# Patient Record
Sex: Male | Born: 1955
Health system: Southern US, Community
[De-identification: ages and names within clinical notes are randomized; demographics above are authoritative.]

## PROBLEM LIST (undated history)

## (undated) ENCOUNTER — Encounter: Attending: Internal Medicine | Primary: Internal Medicine

## (undated) ENCOUNTER — Ambulatory Visit

## (undated) ENCOUNTER — Encounter

## (undated) ENCOUNTER — Ambulatory Visit: Attending: Pharmacist | Primary: Pharmacist

## (undated) ENCOUNTER — Encounter: Attending: Pharmacist | Primary: Pharmacist

## (undated) ENCOUNTER — Telehealth

## (undated) ENCOUNTER — Encounter: Attending: Pulmonary Disease | Primary: Pulmonary Disease

## (undated) ENCOUNTER — Encounter: Attending: "Endocrinology | Primary: "Endocrinology

## (undated) ENCOUNTER — Non-Acute Institutional Stay: Payer: MEDICARE

## (undated) ENCOUNTER — Ambulatory Visit: Payer: PRIVATE HEALTH INSURANCE

## (undated) ENCOUNTER — Other Ambulatory Visit

## (undated) ENCOUNTER — Encounter: Payer: MEDICARE | Attending: Speech-Language Pathologist | Primary: Speech-Language Pathologist

## (undated) ENCOUNTER — Encounter: Payer: MEDICARE | Attending: Internal Medicine | Primary: Internal Medicine

## (undated) ENCOUNTER — Ambulatory Visit: Payer: Medicare (Managed Care)

## (undated) ENCOUNTER — Encounter: Attending: Registered" | Primary: Registered"

## (undated) ENCOUNTER — Telehealth: Attending: Internal Medicine | Primary: Internal Medicine

## (undated) ENCOUNTER — Telehealth: Attending: Family Medicine | Primary: Family Medicine

## (undated) ENCOUNTER — Encounter: Attending: Speech-Language Pathologist | Primary: Speech-Language Pathologist

## (undated) ENCOUNTER — Encounter: Attending: Adult Health | Primary: Adult Health

## (undated) ENCOUNTER — Ambulatory Visit: Attending: Family Medicine | Primary: Family Medicine

## (undated) ENCOUNTER — Encounter: Attending: Diagnostic Radiology | Primary: Diagnostic Radiology

## (undated) ENCOUNTER — Telehealth: Attending: Pharmacist | Primary: Pharmacist

## (undated) ENCOUNTER — Ambulatory Visit: Payer: MEDICARE

## (undated) ENCOUNTER — Non-Acute Institutional Stay: Payer: MEDICARE | Attending: Speech-Language Pathologist | Primary: Speech-Language Pathologist

## (undated) ENCOUNTER — Encounter
Attending: Student in an Organized Health Care Education/Training Program | Primary: Student in an Organized Health Care Education/Training Program

## (undated) ENCOUNTER — Ambulatory Visit: Payer: PRIVATE HEALTH INSURANCE | Attending: Diagnostic Radiology | Primary: Diagnostic Radiology

## (undated) ENCOUNTER — Encounter: Attending: Hospice and Palliative Medicine | Primary: Hospice and Palliative Medicine

## (undated) ENCOUNTER — Telehealth
Attending: Student in an Organized Health Care Education/Training Program | Primary: Student in an Organized Health Care Education/Training Program

## (undated) ENCOUNTER — Ambulatory Visit: Payer: PRIVATE HEALTH INSURANCE | Attending: "Endocrinology | Primary: "Endocrinology

## (undated) ENCOUNTER — Inpatient Hospital Stay

## (undated) ENCOUNTER — Encounter: Payer: MEDICARE | Attending: Hospice and Palliative Medicine | Primary: Hospice and Palliative Medicine

## (undated) ENCOUNTER — Ambulatory Visit: Payer: PRIVATE HEALTH INSURANCE | Attending: Speech-Language Pathologist | Primary: Speech-Language Pathologist

## (undated) DIAGNOSIS — K219 Gastro-esophageal reflux disease without esophagitis: Secondary | ICD-10-CM

## (undated) DIAGNOSIS — J479 Bronchiectasis, uncomplicated: Secondary | ICD-10-CM

## (undated) DIAGNOSIS — M75102 Unspecified rotator cuff tear or rupture of left shoulder, not specified as traumatic: Secondary | ICD-10-CM

## (undated) DIAGNOSIS — Z87442 Personal history of urinary calculi: Secondary | ICD-10-CM

## (undated) DIAGNOSIS — J219 Acute bronchiolitis, unspecified: Secondary | ICD-10-CM

## (undated) DIAGNOSIS — I10 Essential (primary) hypertension: Secondary | ICD-10-CM

## (undated) DIAGNOSIS — R0600 Dyspnea, unspecified: Secondary | ICD-10-CM

## (undated) DIAGNOSIS — J851 Abscess of lung with pneumonia: Secondary | ICD-10-CM

## (undated) DIAGNOSIS — J189 Pneumonia, unspecified organism: Secondary | ICD-10-CM

## (undated) DIAGNOSIS — Z9889 Other specified postprocedural states: Secondary | ICD-10-CM

## (undated) DIAGNOSIS — M1990 Unspecified osteoarthritis, unspecified site: Secondary | ICD-10-CM

## (undated) DIAGNOSIS — R42 Dizziness and giddiness: Secondary | ICD-10-CM

## (undated) DIAGNOSIS — C61 Malignant neoplasm of prostate: Secondary | ICD-10-CM

## (undated) DIAGNOSIS — R112 Nausea with vomiting, unspecified: Secondary | ICD-10-CM

## (undated) DIAGNOSIS — G4733 Obstructive sleep apnea (adult) (pediatric): Secondary | ICD-10-CM

## (undated) HISTORY — DX: Unspecified rotator cuff tear or rupture of left shoulder, not specified as traumatic: M75.102

## (undated) HISTORY — DX: Obstructive sleep apnea (adult) (pediatric): G47.33

## (undated) HISTORY — DX: Bronchiectasis, uncomplicated: J47.9

## (undated) HISTORY — PX: OTHER SURGICAL HISTORY: SHX169

## (undated) HISTORY — DX: Acute bronchiolitis, unspecified: J21.9

## (undated) HISTORY — PX: BACK SURGERY: SHX140

## (undated) HISTORY — DX: Essential (primary) hypertension: I10

## (undated) HISTORY — DX: Dizziness and giddiness: R42

## (undated) HISTORY — DX: Unspecified osteoarthritis, unspecified site: M19.90

## (undated) HISTORY — PX: NECK SURGERY: SHX720

## (undated) HISTORY — DX: Gastro-esophageal reflux disease without esophagitis: K21.9

## (undated) HISTORY — DX: Pneumonia, unspecified organism: J18.9

## (undated) HISTORY — PX: KNEE ARTHROSCOPY: SHX127

## (undated) HISTORY — PX: VIDEO BRONCHOSCOPY: SHX5072

## (undated) HISTORY — DX: Abscess of lung with pneumonia: J85.1

## (undated) MED ORDER — CHOLECALCIFEROL (VITAMIN D3) 50 MCG (2,000 UNIT) CAPSULE: ORAL | 0.00000 days

## (undated) MED ORDER — ZINC GLUCONATE 50 MG TABLET: ORAL | 0 days

---
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
## 1980-12-21 HISTORY — PX: COLONOSCOPY: SHX174

## 1997-03-23 HISTORY — PX: CARPAL TUNNEL RELEASE: SHX101

## 1997-07-03 ENCOUNTER — Inpatient Hospital Stay (HOSPITAL_COMMUNITY): Admission: RE | Admit: 1997-07-03 | Discharge: 1997-07-04 | Payer: Self-pay | Admitting: Neurosurgery

## 1998-01-07 ENCOUNTER — Ambulatory Visit (HOSPITAL_COMMUNITY): Admission: RE | Admit: 1998-01-07 | Discharge: 1998-01-07 | Payer: Self-pay | Admitting: Neurosurgery

## 1998-01-21 ENCOUNTER — Encounter: Payer: Self-pay | Admitting: Neurosurgery

## 1998-01-21 ENCOUNTER — Ambulatory Visit (HOSPITAL_COMMUNITY): Admission: RE | Admit: 1998-01-21 | Discharge: 1998-01-21 | Payer: Self-pay | Admitting: Neurosurgery

## 1998-02-04 ENCOUNTER — Encounter: Payer: Self-pay | Admitting: Neurosurgery

## 1998-02-04 ENCOUNTER — Ambulatory Visit (HOSPITAL_COMMUNITY): Admission: RE | Admit: 1998-02-04 | Discharge: 1998-02-04 | Payer: Self-pay | Admitting: Neurosurgery

## 1998-04-03 ENCOUNTER — Encounter: Admission: RE | Admit: 1998-04-03 | Discharge: 1998-06-25 | Payer: Self-pay | Admitting: Anesthesiology

## 1998-05-30 ENCOUNTER — Ambulatory Visit (HOSPITAL_COMMUNITY): Admission: RE | Admit: 1998-05-30 | Discharge: 1998-05-30 | Payer: Self-pay

## 1998-07-09 ENCOUNTER — Ambulatory Visit: Admission: RE | Admit: 1998-07-09 | Discharge: 1998-07-09 | Payer: Self-pay | Admitting: Family Medicine

## 2001-11-11 ENCOUNTER — Emergency Department (HOSPITAL_COMMUNITY): Admission: EM | Admit: 2001-11-11 | Discharge: 2001-11-11 | Payer: Self-pay | Admitting: Emergency Medicine

## 2001-11-11 ENCOUNTER — Encounter: Payer: Self-pay | Admitting: Emergency Medicine

## 2002-06-16 ENCOUNTER — Encounter: Admission: RE | Admit: 2002-06-16 | Discharge: 2002-06-16 | Payer: Self-pay | Admitting: Neurology

## 2002-06-16 ENCOUNTER — Encounter: Payer: Self-pay | Admitting: Neurology

## 2002-08-16 ENCOUNTER — Encounter: Admission: RE | Admit: 2002-08-16 | Discharge: 2002-08-16 | Payer: Self-pay | Admitting: Orthopedic Surgery

## 2002-08-16 ENCOUNTER — Encounter: Payer: Self-pay | Admitting: Orthopedic Surgery

## 2003-02-27 ENCOUNTER — Emergency Department (HOSPITAL_COMMUNITY): Admission: EM | Admit: 2003-02-27 | Discharge: 2003-02-27 | Payer: Self-pay | Admitting: Emergency Medicine

## 2003-08-31 ENCOUNTER — Ambulatory Visit (HOSPITAL_COMMUNITY): Admission: RE | Admit: 2003-08-31 | Discharge: 2003-09-01 | Payer: Self-pay | Admitting: Otolaryngology

## 2003-09-01 ENCOUNTER — Emergency Department (HOSPITAL_COMMUNITY): Admission: EM | Admit: 2003-09-01 | Discharge: 2003-09-02 | Payer: Self-pay | Admitting: Emergency Medicine

## 2004-01-16 ENCOUNTER — Emergency Department (HOSPITAL_COMMUNITY): Admission: EM | Admit: 2004-01-16 | Discharge: 2004-01-16 | Payer: Self-pay | Admitting: Emergency Medicine

## 2004-04-25 ENCOUNTER — Ambulatory Visit: Payer: Self-pay | Admitting: Internal Medicine

## 2004-05-26 ENCOUNTER — Ambulatory Visit: Payer: Self-pay | Admitting: Internal Medicine

## 2004-05-27 ENCOUNTER — Ambulatory Visit: Payer: Self-pay | Admitting: Internal Medicine

## 2004-05-28 ENCOUNTER — Ambulatory Visit: Payer: Self-pay | Admitting: Gastroenterology

## 2004-06-03 ENCOUNTER — Ambulatory Visit: Payer: Self-pay | Admitting: Gastroenterology

## 2004-06-06 ENCOUNTER — Ambulatory Visit: Payer: Self-pay | Admitting: Gastroenterology

## 2004-06-12 ENCOUNTER — Ambulatory Visit: Payer: Self-pay | Admitting: Internal Medicine

## 2004-06-26 ENCOUNTER — Ambulatory Visit: Payer: Self-pay | Admitting: Gastroenterology

## 2004-07-10 ENCOUNTER — Observation Stay (HOSPITAL_COMMUNITY): Admission: EM | Admit: 2004-07-10 | Discharge: 2004-07-11 | Payer: Self-pay | Admitting: Emergency Medicine

## 2004-09-25 ENCOUNTER — Ambulatory Visit: Payer: Self-pay | Admitting: Internal Medicine

## 2004-12-19 ENCOUNTER — Ambulatory Visit: Payer: Self-pay | Admitting: Oncology

## 2005-01-08 ENCOUNTER — Ambulatory Visit (HOSPITAL_COMMUNITY): Admission: RE | Admit: 2005-01-08 | Discharge: 2005-01-08 | Payer: Self-pay | Admitting: Oncology

## 2005-02-03 ENCOUNTER — Ambulatory Visit: Payer: Self-pay | Admitting: Internal Medicine

## 2005-02-15 ENCOUNTER — Ambulatory Visit: Payer: Self-pay | Admitting: Oncology

## 2005-04-03 ENCOUNTER — Ambulatory Visit: Payer: Self-pay | Admitting: Oncology

## 2005-05-29 ENCOUNTER — Ambulatory Visit: Payer: Self-pay | Admitting: Oncology

## 2005-07-22 ENCOUNTER — Encounter: Payer: Self-pay | Admitting: Neurosurgery

## 2010-03-23 HISTORY — PX: LUNG SURGERY: SHX703

## 2010-04-12 ENCOUNTER — Encounter: Payer: Self-pay | Admitting: Family Medicine

## 2010-10-19 ENCOUNTER — Inpatient Hospital Stay (HOSPITAL_COMMUNITY)
Admission: EM | Admit: 2010-10-19 | Discharge: 2010-10-29 | DRG: 193 | Disposition: A | Discharge status: Home / Self Care | Payer: 59 | Attending: Internal Medicine | Admitting: Internal Medicine

## 2010-10-19 DIAGNOSIS — J189 Pneumonia, unspecified organism: Principal | ICD-10-CM | POA: Diagnosis present

## 2010-10-19 DIAGNOSIS — Q333 Agenesis of lung: Secondary | ICD-10-CM

## 2010-10-19 DIAGNOSIS — D6489 Other specified anemias: Secondary | ICD-10-CM | POA: Diagnosis present

## 2010-10-19 DIAGNOSIS — E871 Hypo-osmolality and hyponatremia: Secondary | ICD-10-CM | POA: Diagnosis present

## 2010-10-19 DIAGNOSIS — J449 Chronic obstructive pulmonary disease, unspecified: Secondary | ICD-10-CM | POA: Diagnosis present

## 2010-10-19 DIAGNOSIS — K047 Periapical abscess without sinus: Secondary | ICD-10-CM | POA: Diagnosis present

## 2010-10-19 DIAGNOSIS — I10 Essential (primary) hypertension: Secondary | ICD-10-CM | POA: Diagnosis present

## 2010-10-19 DIAGNOSIS — Q336 Congenital hypoplasia and dysplasia of lung: Secondary | ICD-10-CM

## 2010-10-19 DIAGNOSIS — J4489 Other specified chronic obstructive pulmonary disease: Secondary | ICD-10-CM | POA: Diagnosis present

## 2010-10-19 DIAGNOSIS — J852 Abscess of lung without pneumonia: Secondary | ICD-10-CM | POA: Diagnosis present

## 2010-10-19 DIAGNOSIS — E876 Hypokalemia: Secondary | ICD-10-CM | POA: Diagnosis present

## 2010-10-19 LAB — COMPREHENSIVE METABOLIC PANEL
ALT: 32 U/L (ref 0–53)
AST: 21 U/L (ref 0–37)
Albumin: 3.4 g/dL — ABNORMAL LOW (ref 3.5–5.2)
Alkaline Phosphatase: 147 U/L — ABNORMAL HIGH (ref 39–117)
BUN: 16 mg/dL (ref 6–23)
CO2: 29 mEq/L (ref 19–32)
Calcium: 9.2 mg/dL (ref 8.4–10.5)
Chloride: 90 mEq/L — ABNORMAL LOW (ref 96–112)
Creatinine, Ser: 1.1 mg/dL (ref 0.50–1.35)
GFR calc Af Amer: >60 mL/min (ref >60)
GFR calc non Af Amer: >60 mL/min (ref >60)
Glucose, Bld: 117 mg/dL — ABNORMAL HIGH (ref 70–99)
Potassium: 2.8 mEq/L — ABNORMAL LOW (ref 3.5–5.1)
Sodium: 132 mEq/L — ABNORMAL LOW (ref 135–145)
Total Bilirubin: 1.4 mg/dL — ABNORMAL HIGH (ref 0.3–1.2)
Total Protein: 8.3 g/dL (ref 6.0–8.3)

## 2010-10-19 LAB — CBC
HCT: 39.5 % (ref 39.0–52.0)
Hemoglobin: 13.9 g/dL (ref 13.0–17.0)
MCH: 30.8 pg (ref 26.0–34.0)
MCHC: 35.2 g/dL (ref 30.0–36.0)
MCV: 87.6 fL (ref 78.0–100.0)
Platelets: 205 10*3/uL (ref 150–400)
RBC: 4.51 MIL/uL (ref 4.22–5.81)
RDW: 12.6 % (ref 11.5–15.5)
WBC: 16.2 10*3/uL — ABNORMAL HIGH (ref 4.0–10.5)

## 2010-10-19 LAB — DIFFERENTIAL
Basophils Absolute: 0 10*3/uL (ref 0.0–0.1)
Basophils Relative: 0 % (ref 0–1)
Eosinophils Absolute: 0 10*3/uL (ref 0.0–0.7)
Eosinophils Relative: 0 % (ref 0–5)
Lymphocytes Relative: 9 % — ABNORMAL LOW (ref 12–46)
Lymphs Abs: 1.4 10*3/uL (ref 0.7–4.0)
Monocytes Absolute: 1.7 10*3/uL — ABNORMAL HIGH (ref 0.1–1.0)
Monocytes Relative: 10 % (ref 3–12)
Neutro Abs: 13 10*3/uL — ABNORMAL HIGH (ref 1.7–7.7)
Neutrophils Relative %: 81 % — ABNORMAL HIGH (ref 43–77)

## 2010-10-20 LAB — COMPREHENSIVE METABOLIC PANEL
ALT: 25 U/L (ref 0–53)
AST: 16 U/L (ref 0–37)
Albumin: 2.6 g/dL — ABNORMAL LOW (ref 3.5–5.2)
Alkaline Phosphatase: 124 U/L — ABNORMAL HIGH (ref 39–117)
BUN: 13 mg/dL (ref 6–23)
CO2: 30 mEq/L (ref 19–32)
Calcium: 8.3 mg/dL — ABNORMAL LOW (ref 8.4–10.5)
Chloride: 95 mEq/L — ABNORMAL LOW (ref 96–112)
Creatinine, Ser: 1.01 mg/dL (ref 0.50–1.35)
GFR calc Af Amer: >60 mL/min (ref >60)
GFR calc non Af Amer: >60 mL/min (ref >60)
Glucose, Bld: 137 mg/dL — ABNORMAL HIGH (ref 70–99)
Potassium: 3.5 mEq/L (ref 3.5–5.1)
Sodium: 133 mEq/L — ABNORMAL LOW (ref 135–145)
Total Bilirubin: 1 mg/dL (ref 0.3–1.2)
Total Protein: 7 g/dL (ref 6.0–8.3)

## 2010-10-20 LAB — CBC
HCT: 35.5 % — ABNORMAL LOW (ref 39.0–52.0)
Hemoglobin: 11.6 g/dL — ABNORMAL LOW (ref 13.0–17.0)
MCH: 29.2 pg (ref 26.0–34.0)
MCHC: 32.7 g/dL (ref 30.0–36.0)
MCV: 89.4 fL (ref 78.0–100.0)
Platelets: 180 10*3/uL (ref 150–400)
RBC: 3.97 MIL/uL — ABNORMAL LOW (ref 4.22–5.81)
RDW: 12.7 % (ref 11.5–15.5)
WBC: 14.5 10*3/uL — ABNORMAL HIGH (ref 4.0–10.5)

## 2010-10-20 LAB — DIFFERENTIAL
Basophils Absolute: 0 10*3/uL (ref 0.0–0.1)
Basophils Relative: 0 % (ref 0–1)
Eosinophils Absolute: 0 10*3/uL (ref 0.0–0.7)
Eosinophils Relative: 0 % (ref 0–5)
Lymphocytes Relative: 7 % — ABNORMAL LOW (ref 12–46)
Lymphs Abs: 1.1 10*3/uL (ref 0.7–4.0)
Monocytes Absolute: 1.5 10*3/uL — ABNORMAL HIGH (ref 0.1–1.0)
Monocytes Relative: 10 % (ref 3–12)
Neutro Abs: 11.9 10*3/uL — ABNORMAL HIGH (ref 1.7–7.7)
Neutrophils Relative %: 82 % — ABNORMAL HIGH (ref 43–77)

## 2010-10-20 LAB — PHOSPHORUS: Phosphorus: 2.3 mg/dL (ref 2.3–4.6)

## 2010-10-20 LAB — MAGNESIUM: Magnesium: 2.3 mg/dL (ref 1.5–2.5)

## 2010-10-21 ENCOUNTER — Telehealth: Payer: Self-pay | Admitting: Internal Medicine

## 2010-10-21 ENCOUNTER — Inpatient Hospital Stay (HOSPITAL_COMMUNITY): Payer: 59

## 2010-10-21 LAB — BASIC METABOLIC PANEL
BUN: 12 mg/dL (ref 6–23)
CO2: 25 mEq/L (ref 19–32)
Calcium: 8.3 mg/dL — ABNORMAL LOW (ref 8.4–10.5)
Chloride: 98 mEq/L (ref 96–112)
Creatinine, Ser: 0.83 mg/dL (ref 0.50–1.35)
GFR calc Af Amer: >60 mL/min (ref >60)
GFR calc non Af Amer: >60 mL/min (ref >60)
Glucose, Bld: 156 mg/dL — ABNORMAL HIGH (ref 70–99)
Potassium: 2.9 mEq/L — ABNORMAL LOW (ref 3.5–5.1)
Sodium: 131 mEq/L — ABNORMAL LOW (ref 135–145)

## 2010-10-21 LAB — CBC
HCT: 31.4 % — ABNORMAL LOW (ref 39.0–52.0)
Hemoglobin: 10.7 g/dL — ABNORMAL LOW (ref 13.0–17.0)
MCH: 30.1 pg (ref 26.0–34.0)
MCHC: 34.1 g/dL (ref 30.0–36.0)
MCV: 88.5 fL (ref 78.0–100.0)
Platelets: 189 10*3/uL (ref 150–400)
RBC: 3.55 MIL/uL — ABNORMAL LOW (ref 4.22–5.81)
RDW: 12.9 % (ref 11.5–15.5)
WBC: 10.1 10*3/uL (ref 4.0–10.5)

## 2010-10-21 NOTE — Telephone Encounter (Signed)
Called number given, someone answered and stated that [name] was unavailable- when I asked if okay to leave a msg, he asked if Dr Maple Hudson was on his way there. I advised that no, he is here seeing pt's this afternoon. He then stated will call us back and hung up the phone. Will wait for a call back.

## 2010-10-22 ENCOUNTER — Inpatient Hospital Stay (HOSPITAL_COMMUNITY): Payer: 59

## 2010-10-22 LAB — CBC
HCT: 31.4 % — ABNORMAL LOW (ref 39.0–52.0)
Hemoglobin: 10.3 g/dL — ABNORMAL LOW (ref 13.0–17.0)
MCH: 29.3 pg (ref 26.0–34.0)
MCHC: 32.8 g/dL (ref 30.0–36.0)
MCV: 89.5 fL (ref 78.0–100.0)
Platelets: 215 10*3/uL (ref 150–400)
RBC: 3.51 MIL/uL — ABNORMAL LOW (ref 4.22–5.81)
RDW: 13 % (ref 11.5–15.5)
WBC: 7.9 10*3/uL (ref 4.0–10.5)

## 2010-10-22 LAB — BASIC METABOLIC PANEL
BUN: 11 mg/dL (ref 6–23)
CO2: 28 mEq/L (ref 19–32)
Calcium: 8.8 mg/dL (ref 8.4–10.5)
Chloride: 101 mEq/L (ref 96–112)
Creatinine, Ser: 0.76 mg/dL (ref 0.50–1.35)
GFR calc Af Amer: >60 mL/min (ref >60)
GFR calc non Af Amer: >60 mL/min (ref >60)
Glucose, Bld: 132 mg/dL — ABNORMAL HIGH (ref 70–99)
Potassium: 3.1 mEq/L — ABNORMAL LOW (ref 3.5–5.1)
Sodium: 138 mEq/L (ref 135–145)

## 2010-10-22 LAB — CULTURE, RESPIRATORY W GRAM STAIN: Culture: NORMAL

## 2010-10-22 LAB — CULTURE, RESPIRATORY

## 2010-10-22 MED ORDER — IOHEXOL 300 MG/ML  SOLN
80.0000 mL | Freq: Once | INTRAMUSCULAR | Status: AC | PRN
  Administered 2010-10-22: 80 mL via INTRAVENOUS

## 2010-10-22 NOTE — H&P (Signed)
  NAMETICO, CROTTEAU NO.:  [id number]  MEDICAL RECORD NO.:  [id number]  LOCATION:  WLED                         FACILITY:  [facility]  PHYSICIAN:  Michiel Cowboy, MDDATE OF BIRTH:  02/11/1956  DATE OF ADMISSION:  10/19/2010 DATE OF DISCHARGE:                             HISTORY & PHYSICAL   ADDENDUM: After discussion, with the Pulmonology on call were decided to change his antibiotics to cefepime and azithromycin given his structural lung abnormality and then if his sputum cultures did not grow any pseudomonas, then his medication regimen could be simplified.  If the patient does not improve within reasonable amount of time, would get an official pulmonology consult.     Michiel Cowboy, MD     AVD/MEDQ  D:  10/19/2010  T:  10/19/2010  Job:  [id number]  Electronically Signed by Therisa Doyne MD on 10/22/2010 12:11:51 AM

## 2010-10-22 NOTE — Telephone Encounter (Signed)
Spoke with patient-states he has not been seen by any pulmonary dr for his condition; I spoke with pt and explained he needed to speak with charge nurse for his floor about this. I called the patient back to check on him and he stated that the charge nurse heard the hospitalist call our group for a MD to see him.

## 2010-10-22 NOTE — H&P (Signed)
[name], [name] NO.:  [id number]  MEDICAL RECORD NO.:  [id number]  LOCATION:  WLED                         FACILITY:  [facility]  PHYSICIAN:  Michiel Cowboy, MDDATE OF BIRTH:  [birth year]  DATE OF ADMISSION:  10/19/2010 DATE OF DISCHARGE:                             HISTORY & PHYSICAL   PRIMARY CARE PHYSICIAN:  Caryn Bee L. Little, MD  CHIEF COMPLAINT:  Fever, cough, and findings of a chest x-ray consistent with pneumonia per primary care doctor's office.  HISTORY OF PRESENT ILLNESS:  The patient is a 55 year old gentleman with a history of recurrent pneumonias.  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  Otherwise, the patient has been fairly healthy except for mild hypertension.  Since 2 days ago, he started to have cough, nausea, and vomiting.  No diarrhea.  He has occasional back pain involving the right side of his back to his scapula.  Of note, he also endorsed that in the beginning of July, he had some tick bites and nothing more recent than that.  He went to his primary care provider and was evaluated and had a chest x-ray done that showed right lower lobe pneumonia, which was able to be read by the radiologist here and was confirmed.  Otherwise, he felt a little lightheaded, but no syncope.  He had a little bit of shortness of breath and cough productive of yellow sputum.  He had fever at home and in the emergency department up to 102.9.  Otherwise, review of systems is negative.  Ten systems were reviewed.  PAST SOCIAL HISTORY:  The patient never smoked.  He drinks very rarely. He does not abuse drugs.  FAMILY HISTORY:  Significant for mother with strokes.  Otherwise unremarkable.  PAST MEDICAL HISTORY:  Significant for hypertension, recurrent pneumonias, back surgery.  ALLERGIES:  MORPHINE causes  nausea.  MEDICATIONS: 1. Hydrocodone as needed for pain. 2. Hydrochlorothiazide 25 mg daily. 3. AndroGel to palms a day.  PHYSICAL EXAMINATION:  VITAL SIGNS:  T-max 102.9; blood pressure 131/54, now down to 118/60; pulse 105; respirations 22; saturating 96% on room air. GENERAL:  The patient appears to be currently in no acute distress, sitting on the side of the bed, appears well. HEENT:  Head, nontraumatic.  Dry mucous membranes. LUNGS:  Definite crackles on the right base. HEART:  Regular rate and rhythm.  No murmurs appreciated. ABDOMEN:  Soft, nontender, and nondistended. EXTREMITIES:  No clubbing, cyanosis or edema. NEUROLOGIC:  Grossly intact. SKIN:  Decreased skin turgor.  Clean, dry, intact.  LABORATORY DATA:  White blood cell count 16.2, hemoglobin 13.9.  Sodium 132, potassium 2.8, creatinine 1.1, calcium 9.2, total protein 8.3, albumin 3.4.  Chest x-ray, outside returned, right lower lobe pneumonia.  ASSESSMENT/PLAN:  This is a 55 year old gentleman with what appears to be a right lower lobe pneumonia.  We will admit for community acquired. We will put him on Rocephin and azithromycin.  If there is no improvement, I will consider broaden antibiotic courses.  He may have an element  of abnormal lung structure.  May benefit from pulmonology consult.  Especially if there is no clear improvement, we will also obtain blood cultures and sputum culture.  He is currently hemodynamically stable.  Appears to be nontoxic.  Hypokalemia.  We will replace, check magnesium level, watch on telemetry.  History of hypertension.  Hold his hydrochlorothiazide.  Mild hyponatremia.  Sodium 132, likely secondary to dehydration.  We will follow, hold hydrochlorothiazide, give IV fluid.  Clinical dehydration.  IV fluids and followup.  Prophylaxis.  We will write for good p.o. intake and SCDs.  I spent a total of over 65 minutes on this admission   Michiel Cowboy,  MD     AVD/MEDQ  D:  10/19/2010  T:  10/19/2010  Job:  [id number]  cc:   Caryn Bee L. Little, M.D. Fax: [phone]  Electronically Signed by Therisa Doyne MD on 10/22/2010 12:12:04 AM

## 2010-10-23 DIAGNOSIS — J852 Abscess of lung without pneumonia: Secondary | ICD-10-CM

## 2010-10-23 DIAGNOSIS — K219 Gastro-esophageal reflux disease without esophagitis: Secondary | ICD-10-CM

## 2010-10-23 DIAGNOSIS — K089 Disorder of teeth and supporting structures, unspecified: Secondary | ICD-10-CM

## 2010-10-23 LAB — BASIC METABOLIC PANEL
BUN: 12 mg/dL (ref 6–23)
CO2: 26 mEq/L (ref 19–32)
Calcium: 9.3 mg/dL (ref 8.4–10.5)
Chloride: 100 mEq/L (ref 96–112)
Creatinine, Ser: 0.65 mg/dL (ref 0.50–1.35)
GFR calc Af Amer: >60 mL/min (ref >60)
GFR calc non Af Amer: >60 mL/min (ref >60)
Glucose, Bld: 122 mg/dL — ABNORMAL HIGH (ref 70–99)
Potassium: 3.6 mEq/L (ref 3.5–5.1)
Sodium: 136 mEq/L (ref 135–145)

## 2010-10-23 LAB — CBC
HCT: 33.8 % — ABNORMAL LOW (ref 39.0–52.0)
Hemoglobin: 11.1 g/dL — ABNORMAL LOW (ref 13.0–17.0)
MCH: 29.4 pg (ref 26.0–34.0)
MCHC: 32.8 g/dL (ref 30.0–36.0)
MCV: 89.4 fL (ref 78.0–100.0)
Platelets: 257 10*3/uL (ref 150–400)
RBC: 3.78 MIL/uL — ABNORMAL LOW (ref 4.22–5.81)
RDW: 13.1 % (ref 11.5–15.5)
WBC: 6.9 10*3/uL (ref 4.0–10.5)

## 2010-10-23 LAB — MAGNESIUM: Magnesium: 1.9 mg/dL (ref 1.5–2.5)

## 2010-10-24 ENCOUNTER — Inpatient Hospital Stay (HOSPITAL_COMMUNITY): Payer: 59

## 2010-10-24 LAB — CBC
HCT: 34 % — ABNORMAL LOW (ref 39.0–52.0)
Hemoglobin: 11 g/dL — ABNORMAL LOW (ref 13.0–17.0)
MCH: 29.4 pg (ref 26.0–34.0)
MCHC: 32.4 g/dL (ref 30.0–36.0)
MCV: 90.9 fL (ref 78.0–100.0)
Platelets: 289 10*3/uL (ref 150–400)
RBC: 3.74 MIL/uL — ABNORMAL LOW (ref 4.22–5.81)
RDW: 13.2 % (ref 11.5–15.5)
WBC: 6.2 10*3/uL (ref 4.0–10.5)

## 2010-10-24 LAB — ROCKY MTN SPOTTED FVR AB, IGM-BLOOD: RMSF IgM: 0.06 IV (ref 0.00–0.89)

## 2010-10-24 LAB — BASIC METABOLIC PANEL
BUN: 11 mg/dL (ref 6–23)
CO2: 31 mEq/L (ref 19–32)
Calcium: 9.1 mg/dL (ref 8.4–10.5)
Chloride: 100 mEq/L (ref 96–112)
Creatinine, Ser: 0.75 mg/dL (ref 0.50–1.35)
GFR calc Af Amer: >60 mL/min (ref >60)
GFR calc non Af Amer: >60 mL/min (ref >60)
Glucose, Bld: 123 mg/dL — ABNORMAL HIGH (ref 70–99)
Potassium: 3.9 mEq/L (ref 3.5–5.1)
Sodium: 136 mEq/L (ref 135–145)

## 2010-10-24 LAB — ROCKY MTN SPOTTED FVR AB, IGG-BLOOD: RMSF IgG: 0.28 IV

## 2010-10-25 ENCOUNTER — Inpatient Hospital Stay (HOSPITAL_COMMUNITY): Payer: 59

## 2010-10-25 LAB — PROCALCITONIN: Procalcitonin: 0.23 ng/mL

## 2010-10-26 LAB — CULTURE, BLOOD (ROUTINE X 2)
Culture  Setup Time: 201207300915
Culture  Setup Time: 201207300915
Culture: NO GROWTH
Culture: NO GROWTH

## 2010-10-27 ENCOUNTER — Inpatient Hospital Stay (HOSPITAL_COMMUNITY): Payer: 59

## 2010-10-27 ENCOUNTER — Encounter (HOSPITAL_COMMUNITY): Payer: 59

## 2010-10-27 ENCOUNTER — Other Ambulatory Visit: Payer: Self-pay | Admitting: Pulmonary Disease

## 2010-10-27 DIAGNOSIS — J852 Abscess of lung without pneumonia: Secondary | ICD-10-CM

## 2010-10-27 DIAGNOSIS — J189 Pneumonia, unspecified organism: Secondary | ICD-10-CM

## 2010-10-27 NOTE — Consult Note (Signed)
[name], BERKOWITZ NO.:  MEDICAL RECORD NO.:  [id number]  LOCATION:  1422                         FACILITY:  [facility]  PHYSICIAN:  Ines Bloomer, M.D. DATE OF BIRTH:  [birth year]  DATE OF CONSULTATION: DATE OF DISCHARGE:                                CONSULTATION   CHIEF COMPLAINT:  Fever.  HISTORY OF PRESENT ILLNESS:  This 55 year old patient has long history of pulmonary problems, who was originally seen by Dr. Valrie Hart and then by Dr. Jetty Duhamel, and is now been followed by his primary care physician.  He had a bronchoscopy done by Dr. 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  He has pneumonia 2-3 times a year treated by his medical doctor, he came in, and was admitted several days ago with fever, chills, temp of 102, purulent sputum, and cough.  CT scan revealed a right lower lobe was pretty much destroyed with bronchiectasis and right lower lobe abscess with some involvement in the right middle lobe.  The left lower lobe had some mild bronchiectasis and the CT scan done in 2005, showed right lower lobe and left lower lobe bronchiectasis.  PAST MEDICAL HISTORY:  Significant for hypertension and he has had 9 back surgeries, both cervical and lumbar as well as left knee arthroscopy.  MEDICATIONS:  Codeine for pain, hydrochlorothiazide, AndroGel.  ALLERGIES:  MORPHINE caused him to have nausea.  In the hospital, he was placed on Maxipime and Vibramycin and switched to Avelox and vancomycin.  His cultures were still pending.  FAMILY HISTORY:  Significant for vascular disease.  SOCIAL HISTORY:  He does not smoke, do occasional alcohol.  He is a retired Art gallery manager.  REVIEW OF SYSTEMS:  GENERAL:  His weight has been stable.  CARDIAC:  No angina or atrial fibrillation.  PULMONARY:  See history  of present illness.  GI:  No nausea, vomiting, constipation, diarrhea.  GU:  No kidney disease, dysuria, or frequent urination.  VASCULAR:  No claudication, DVT, TIAs.  NEUROLOGICAL:  No dizziness, headaches, blackouts, or seizures.  MUSCULOSKELETAL:  No arthritis.  PSYCHIATRIC: No depression or nervousness.  EYES AND ENT:  No changes in eyesight or hearing.  HEMATOLOGICAL:  No problems with bleeding, clotting disorders, or anemia.  PHYSICAL EXAMINATION:  GENERAL:  He is a well-developed Caucasian male in no acute distress. VITAL SIGNS:  Temp is 99, blood pressure is 110/70, respirations are 18, sats were 94%. HEAD, EYES, EARS, NOSE, AND THROAT:  Unremarkable. CHEST:  Decreased breath sounds on the right side. HEART:  Regular sinus rhythm.  No murmurs. ABDOMEN:  Soft.  No hepatosplenomegaly. EXTREMITIES:  Pulses are 2+.  There is no clubbing or edema. NEUROLOGICAL:  He is oriented x3.  Sensory and motor intact.  Cranial nerves intact.  IMPRESSION: 1. Right lower lobe bronchiectasis and abscess with involvement of     right middle lobe. 2. Hypertension. 3. Chronic back pain and back disease. 4. Status post left knee arthroscopy.  PLAN:  He has antibiotics, pulmonary  function test, possible bronchoscopy.     Ines Bloomer, M.D.     DPB/MEDQ  D:  10/24/2010  T:  10/25/2010  Job:  [id number]

## 2010-10-28 DIAGNOSIS — J852 Abscess of lung without pneumonia: Secondary | ICD-10-CM

## 2010-10-28 DIAGNOSIS — K219 Gastro-esophageal reflux disease without esophagitis: Secondary | ICD-10-CM

## 2010-10-29 ENCOUNTER — Inpatient Hospital Stay (HOSPITAL_COMMUNITY): Payer: 59

## 2010-10-29 DIAGNOSIS — K089 Disorder of teeth and supporting structures, unspecified: Secondary | ICD-10-CM

## 2010-10-29 DIAGNOSIS — J852 Abscess of lung without pneumonia: Secondary | ICD-10-CM

## 2010-10-29 LAB — CBC
HCT: 40.2 % (ref 39.0–52.0)
Hemoglobin: 13.3 g/dL (ref 13.0–17.0)
MCH: 29.6 pg (ref 26.0–34.0)
MCHC: 33.1 g/dL (ref 30.0–36.0)
MCV: 89.3 fL (ref 78.0–100.0)
Platelets: 406 10*3/uL — ABNORMAL HIGH (ref 150–400)
RBC: 4.5 MIL/uL (ref 4.22–5.81)
RDW: 12.9 % (ref 11.5–15.5)
WBC: 7 10*3/uL (ref 4.0–10.5)

## 2010-10-29 NOTE — Consult Note (Signed)
NAMESHIQUAN, MATHIEU NO.:  [id number]  MEDICAL RECORD NO.:  [id number]  LOCATION:  1422                         FACILITY:  [facility]  PHYSICIAN:  Cindra Eves, D.D.S.DATE OF BIRTH:  03-Feb-1956  DATE OF CONSULTATION:  10/27/2010 DATE OF DISCHARGE:                                CONSULTATION   HISTORY OF PRESENT ILLNESS:  [name] is a 55 year old male referred by Drs. Rama and [facility] for dental consultation.  The patient recently admitted with a history of lung abscess and pneumonia.  Dental consultation requested to rule out possible dental etiology for the lung abscess.  MEDICAL HISTORY: 1. History of recurrent pneumonias with current right lower lobe     pneumonia and lung abscess.     a.     Currently being treated with IV antibiotic therapy.     b.     The patient with anticipated bronchoscopy pending. 2. Hypertension. 3. History of degenerative joint disease, status post multiple back     surgeries x9 for cervical lumbar problems. 4. Status post left knee arthroscopic for a torn meniscus.  ALLERGIES OR ADVERSE DRUG REACTION:  MORPHINE SULFATE CAUSES NAUSEA AND VOMITING.  MEDICATIONS: 1. Tylenol 500 mg 3 times daily. 2. Cipro 40 mg IV every 12 hours. 3. Clindamycin 600 mg every 6 hours. 4. Tussionex 5 mL every 12 hours. 5. Zofran 4 mg IV every 6 hours. 6. Oxycodone 7.5 mg 3 times daily. 7. K-Dur 40 mEq 3 times daily. 8. Florastor 250 mg twice daily.  SOCIAL HISTORY:  The patient is married.  The patient has never smoked. The patient is a retired Acupuncturist.  The patient rarely drinks.  The patient denies previous drug abuse.  FAMILY HISTORY:  Mother with a history of strokes and possible valvular disease.  FUNCTIONAL ASSESSMENT:  The patient was independent for ADLs prior to this admission.  REVIEW OF SYSTEMS:  Reviewed from the chart for this admission.  DENTAL HISTORY:  Chief Complaint:  Dental consultation requested  to rule out possible dental etiology for the lung abscess.  HISTORY OF PRESENT ILLNESS:  The patient currently denies acute toothache, swellings, or abscesses.  The patient was last seen 4-5 years ago by Dr. Hulen Skains on [facility].  The patient does not seek regular dental care at this time.  Patient examined cleaning at that time.  DENTAL EXAMINATION:  GENERAL:  The patient is well-developed, well- nourished male in no acute distress. VITAL SIGNS:  Blood pressure is 112/74, pulse rate 74, respirations are 16, and temperature is 97.5. HEAD EXAMINATION:  There is no palpable submandibular lymphadenopathy. I do not see any extraoral swelling at this time. DENTAL EXAMINATION:  Patient with normal saliva.  I do not see any evidence of abscess formation within the mouth. DENTITION:  The patient with multiple missing teeth, but generally intact dentition. PERIODONTAL:  Patient with chronic periodontitis with plaque and calculus accumulations, selective areas of gingival recession, but no significant tooth mobility. DENTAL CARIES:  There are multiple dental caries noted.  Tooth #18 as extensive caries associated with loss of the most of the coronal portion.  I would need a full series  dental radiographs to identify the extent of the other dental caries, although the maxillary anterior area numbers 9 and 10 also have dental caries noted. ENDODONTIST:  The patient currently denies acute pulpitis symptoms.  The patient however does have an area of periapical pathology and radiolucency located at the apex of tooth #18. CROWN OR BRIDGE:  There are multiple crown restorations noted.  The patient also could be evaluated for future crown restorations. PROSTHODONTIST:  The patient denies presence of partial dentures. OCCLUSION:  The patient with a stable occlusion, but a poor occlusal scheme.  RADIOGRAPHIC INTERPRETATION:  Panoramic x-ray was taken on October 24, 2010.  There are  multiple missing teeth.  There are dental caries noted.  There is periapical pathology and radiolucency noted at the apices of tooth #18.  Tooth #17 has a soft tissue impaction.  There is incipient to moderate bone loss.  ASSESSMENT: 1. Chronic apical periodontitis affecting tooth #18. 2. No history of acute pulpitis symptoms. 3. Chronic periodontitis with bone loss. 4. Plaque and calculus accumulations. 5. Selective areas of gingival recession. 6. No excessive tooth mobility noted at this time. 7. Multiple dental caries. 8. Multiple missing teeth. 9. Poor occlusal scheme, but stable occlusion. 10.History of oral neglect.  PLAN/RECOMMENDATIONS: 1. I discussed the risks, benefits, and complication of various     treatment option with the patient in relationship to his medical     and dental conditions.  We discussed various treatment options to     include no treatment, referral to an oral surgeon for extraction of     tooth #17 and #18 as an outpatient, possible extraction of tooth     #17 and #18 if the patient remains an inpatient and specifically if     acute problems arise during the remainder of this admission,     followup with his primary dentist Dr. Derrill Kay for exam,     radiographs, and other treatment planning issues to include dental     restorations, periodontal therapy, further extractions as     indicated, further crown or bridge therapy, implant therapy, and     replacing missing teeth as indicated.  The patient currently is     interested in getting discharged once the bronchoscopy procedure is     performed and then will follow up with an oral surgeon for     extraction of indicated teeth #17 and #18 as an outpatient.  I will     assist in referral as indicated     or Dr. Hulen Skains also assist in referral.  The patient was given     names of doctors, Dr. Cherlyn Cushing and Dr. Hubert Azure as possible     oral surgeons that the patient could see for the dental      extractions. 2. Discussion of findings with the medical team and coordination with     future dental treatment as indicated.          ______________________________ Cindra Eves, D.D.S.     RK/MEDQ  D:  10/27/2010  T:  10/28/2010  Job:  [id number]  cc:   Hillery Aldo, M.D.  Kalman Shan, MD [address] [place] [address] [place] [address]  Dr. Hulen Skains [facility]  Electronically Signed by Cindra Eves D.D.S. on 10/29/2010 08:49:07 AM

## 2010-10-30 LAB — CULTURE, RESPIRATORY W GRAM STAIN

## 2010-10-30 LAB — CULTURE, RESPIRATORY

## 2010-10-30 NOTE — Discharge Summary (Signed)
[name], [name] NO.:  [id number]  MEDICAL RECORD NO.:  [id number]  LOCATION:  1422                         FACILITY:  [facility]  PHYSICIAN:  Brendia Sacks, MD    DATE OF BIRTH:  October 06, 1955  DATE OF ADMISSION:  10/19/2010 DATE OF DISCHARGE:  10/29/2010                              DISCHARGE SUMMARY   PRIMARY CARE PHYSICIAN:  Caryn Bee L. Little, M.D.  PULMONOLOGIST:  Kalman Shan, MD.  SURGEON:  Ines Bloomer, M.D.  CONDITION ON DISCHARGE:  Improved.  DISPOSITION:  Home.  DISCHARGE DIAGNOSES: 1. Right middle lobe, right lower lobe pneumonia with abscess, status     post bronchoscopy. 2. Right middle lobe hypoplasia, by history. 3. Questionable severe chronic obstructive pulmonary disease, status     post PFTs. 4. Periapical tooth abscess.  HISTORY OF PRESENT ILLNESS:  This is a 55 year old man who presented to the emergency room with pneumonia and was admitted for further evaluation.  HOSPITAL COURSE:  The patient was admitted to medical floor and continued on broad-spectrum antibiotics.  On his second day of hospitalization, he developed hemoptysis and Pulmonology was consulted. CT scan was performed, which did show an abscess in the right lung.  In consultation with Pulmonology and Cardiothoracic Surgery, also evaluated the patient and felt that he is likely to need resection in the future. He did undergo bronchoscopy as well as PFTs.  The patient continued to improve.  His hypoxia has resolved.  He ambulated on room air today with oxygen saturation of 95-98%.  He feels quite well when asked to go home. He was seen by Pulmonology today and they have recommended a prolonged course of antibiotics.  They will follow up with him in the outpatient setting in approximately 10 days.  It is felt that the patient is likely to need lung resection in the future with Dr. Edwyna Shell.  In regard to questionable severe COPD, PFTs suggested he had severe  COPD.  However, this may be partially a reflection of his acute illness.  Suggest further outpatient evaluation.  He did not have significant bronchospasm while in the hospital.  He was noted to have periapical tooth abscess. He was seen by Dr. Kristin Bruins here, but it was not felt this was likely contributory to his lung abscess.  Dr. Kristin Bruins will again help coordinate outpatient followup and the patient was also given contact information for oral surgeons for definitive treatment for his tooth issues.  CONSULTATIONS: 1. Dr. Marchelle Gearing of Pulmonology. 2. Dr. Edwyna Shell of cardiothoracic surgery. 3. Dr. Kristin Bruins of Dental Medicine.  PROCEDURES: 1. Bronchoscopy, October 27, 2010, which shows thick purulent sputum     emanating from the right middle lobe.  Cultures are pending at this     time. 2. Pulmonary function tests, August 3:  Severe obstructive airways     disease with an FEV1 of 1.78 and FVC of 2.50.  IMAGING: 1. CT chest, August 1 1:  Right lower lobe consolidation with cystic     and bronchiectasis changes with a 5.2 x 8 cm gas fluid collection     compatible with pneumonia associated with infected bulla/abscess. 2. Orthopantogram, August 3:  Question periapical  abscess associated     with the left lower second most posterior tooth. 3. Chest x-ray, August 8:  Improving aeration of the right lung base.     Persistent cavitary right lower lobe lesion with air-fluid level     consistent with pulmonary abscess.  Unchanged small right pleural     effusion.  MICROBIOLOGY: 1. Respiratory culture from July 30th showed normal oropharyngeal     flora. 2. Blood cultures, July 29, no growth, final. 3. Bronchioalveolar lavage, cultures are in process at this point.     This showed no growth to date.  PATHOLOGY:  Cytologies for bronchial brushings were negative for malignancy.  CBC is unremarkable on discharge.  Basic metabolic panel unremarkable on discharge.  EXAM ON DISCHARGE:   GENERAL:  The patient is feeling quite well.  He ambulated today and he is not hypoxic. VITAL SIGNS:  Temperature is 98.9, pulse 99, respirations 20, blood pressure 108/66, and sat 96% on room air. CARDIOVASCULAR:  Regular rate and rhythm.  No murmur, rub, or gallop. RESPIRATORY:  Clear to auscultation bilaterally.  No wheezes, rales, or rhonchi.  There is normal respiratory effort.  DISCHARGE INSTRUCTIONS:  The patient will be discharged home today. Diet is a heart-healthy diet.  Activity, increase slowly.  Follow up with Tammy Parrett in [facility] on Thursday, August 16 at 2 p.m.  Follow up with Dr. Karle Plumber in approximately three weeks.  DISCHARGE MEDICATIONS: 1. Ciprofloxacin 500 mg p.o. b.i.d. 2. Clindamycin 150 mg, total take 450 mg p.o. q.i.d. 3. Robitussin dextromethorphan syrup 5 mL every four 4 hours as needed     for cough. 4. Florastor 250 mg p.o. b.i.d. while on antibiotics. 5. AndroGel one application topically daily. 6. Lortab 7.5/750 p.o. t.i.d. 7. Ibuprofen 200 mg three to four tablets every eight hours as needed     for pain.  Discontinue the following medications, 1. Hydrochlorothiazide.  Blood pressure has been well controlled off     this medication.  Things to follow up in the outpatient setting.  Continue treatment for lung abscess.  Defer antibiotic duration to pulmonology.  I have given him a month supply of each.  Follow up with Dr. Edwyna Shell as well for elective lung resection.  Time coordinating discharge is 35 minutes.     Brendia Sacks, MD     DG/MEDQ  D:  10/29/2010  T:  10/30/2010  Job:  [id number]  cc:   Caryn Bee L. Little, M.D. Fax: [phone]  Kalman Shan, MD [address] [name] [address] [place] [address]  Ines Bloomer, M.D. [address] [place], [place] [address]  Electronically Signed by Brendia Sacks  on 10/30/2010 05:57:58 PM

## 2010-10-31 ENCOUNTER — Telehealth: Payer: Self-pay | Admitting: Pulmonary Disease

## 2010-10-31 NOTE — Telephone Encounter (Signed)
Called and spoke with pt and he stated that RA sent him home from hospital on abx and told him to eat yogurt with this but didn't tell him how much to eat.  Advised pt to eat yogurt each time he takes the abx.  Pt voiced his understanding.

## 2010-11-02 NOTE — Discharge Summary (Signed)
  [name], MONETTE NO.:  [id number]  MEDICAL RECORD NO.:  [id number]  LOCATION:  1422                         FACILITY:  [facility]  PHYSICIAN:  Oretha Milch, MD      DATE OF BIRTH:  Feb 08, 1956  DATE OF ADMISSION:  10/19/2010 DATE OF DISCHARGE:                              DISCHARGE SUMMARY   PROCEDURE PERFORMED:  Video bronchoscopy with brushings.  INDICATION FOR THE PROCEDURE:  Lung abscess with history of right middle lobe syndrome, rule out other possibilities prior to surgery.  PROCEDURE NOTE:  Written and informed consent was obtained from the patient prior to the procedure.  The procedure including coughing, bleeding, and that of conscious sedation were discussed in detail. Versed 4 mg and 125 mcg of fentanyl were used in divided doses during the procedure.  Bronchoscope was introduced from the right nares.  Upper airway appeared normal.  Vocal cords showed normal appearance and motion.  Tracheobronchial tree was examined at the subsegmental level. Normal anatomy of the tracheobronchial tree was noted.  Thick purulent phlegm was seen emanating from the right middle lobe.  This was suctioned off.  Washings and brushings were obtained from the right middle lobe.  The patient tolerated the procedure well with minimal bleeding except for vigorous coughing.  Bronchoscope was then withdrawn. Specimens will be sent for AFB and fungal etiologies and culture and brushings will be sent for cytology.     Oretha Milch, MD     RVA/MEDQ  D:  10/27/2010  T:  10/28/2010  Job:  [id number]  cc:   Ines Bloomer, M.D. [address] [place], [place] [address]  Electronically Signed by Cyril Mourning MD on 11/02/2010 09:33:39 PM

## 2010-11-04 NOTE — Progress Notes (Signed)
[name], [name] NO.:  [id number]  MEDICAL RECORD NO.:  [id number]  LOCATION:  1422                         FACILITY:  [facility]  PHYSICIAN:  Hillery Aldo, M.D.   DATE OF BIRTH:  1956/02/22                                PROGRESS NOTE   DATE OF DISCHARGE: Pending.  PRIMARY CARE PHYSICIAN: Caryn Bee L. Little, M.D.  PULMONOLOGIST: Kalman Shan, MD.  CVTS SURGEON: Ines Bloomer, M.D.  CURRENT DIAGNOSES: 1. Right middle lobe/right lower lobe pneumonia with abscess status     post bronchoscopy. 2. Right middle lobe hypoplasia. 3. Questionable severe chronic obstructive pulmonary disease status     post PFTs. 4. Periapical tooth abscess. 5. Hypokalemia, resolved. 6. Hyponatremia, resolved. 7. History of tick exposure with [facility] spotted fever titers     negative. 8. Anemia secondary to acute illness.  DISCHARGE MEDICATIONS: To be dictated at the time of actual discharge.  CONSULTATIONS: 1. Kalman Shan, MD of Pulmonology. 2. Ines Bloomer, M.D. of CVTS. 3. Cindra Eves, D.D.S. of Dental Medicine.  BRIEF ADMISSION HISTORY OF PRESENT ILLNESS: The patient is a 55 year old male with past medical history of right middle lobe hypoplasia and recurrent pneumonia, who presented to the hospital with a chief complaint of fever, cough, and lethargy.  Upon initial evaluation in the emergency department, the patient had a chest radiograph, which confirmed a right lower lobe pneumonia and he subsequently was referred to the hospitalist service for further evaluation and treatment.  For full details, please see the dictated report done by Dr. Adela Glimpse.  PROCEDURES AND DIAGNOSTIC STUDIES: 1. Chest x-ray on October 21, 2010, showed slight enlargement of the     cardiac silhouette.  Atelectasis and infiltrative opacities and     coalescent opacity in the lower right lung.  Patchy infiltrative     density atelectasis in the left base.  Mild  vascular congestion     pattern.  Blunting of costophrenic angle may reflect small amount     of pleural effusion. 2. CT scan of the chest on October 22, 2010, showed a right lower lobe     consolidation with cystic/bronchiectatic changes and a 5.2 x 8 cm     gas/fluid collection, compatible with pneumonia and associated     infected bulla/abscess.  Small associated right pleural effusion. 3. Orthopantogram on October 24, 2010, showed questionable periapical     abscess associated with the left lower second most posterior tooth. 4. Chest x-ray on October 25, 2010, showed improved bibasilar airspace     opacities.  Persistent cavitary lung lesion with an air-fluid level     at the right lung base consistent with abscess. 5. Pulmonary function tests done on October 24, 2010, showed severe     obstructive airways disease with an FEV-1 of 1.78 and FVC of 2.50. 6. Bronchoscopy performed on October 27, 2010, showed thick purulent     sputum emanating from the right middle lobe.  Brushings obtained.     Cultures obtained.  Results pending at the time of this dictation. 7. Chest x-ray on October 27, 2010, showed residual right lower lobe     airspace disease  consistent with pneumonia.  Small right effusion.     No pneumothorax.  DISCHARGE LABORATORY VALUES: To be dictated at the time of actual discharge.  HOSPITAL COURSE BY PROBLEM: 1. Right middle lobe/right lower lobe abscess/pneumonia:  The patient     was admitted and initially put on antibiotics consisting of     cefepime and azithromycin.  Sputum cultures were sent and     ultimately were negative.  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 Marchelle Gearing.  The patient underwent CT scanning of     the chest with findings as noted above.  Cardiovascular Thoracic     Surgery was also consulted to evaluate  whether the patient would     benefit from resection of the diseased part of his lung that has     been the source of recurrent problems with pneumonia.  The patient     ultimately had PFTs done and was taken for bronchoscopy with     findings as noted above.  Plan is to continue this course of     antibiotics per Pulmonology recommendations regarding duration of     treatment and to have him follow up with both Pulmonology and with     CVTS for ultimate resection of the right middle lobe for definitive     treatment. 2. Right middle lobe syndrome:  Again, the patient has had recurrent     pneumonias related to this and has been seen by Dr. Edwyna Shell in     consultation who plans to see him in follow up postdischarge to     schedule an elective resection of his lung. 3. Questionable severe COPD:  The patient's PFTs were done when he was     acutely ill and I suspect that his functional lung capacity is     somewhat better than reflected on this exam.  Would recommend     repeating these as an outpatient.  He has not had any significant     bronchospasm while in the hospital. 4. Periapical tooth abscess:  The patient was seen in consultation     with Dr. Kristin Bruins, who recommends outpatient follow up with an oral     surgeon.  He does not believe that the patient's current gum     disease or tooth abscess was responsible for his necrotizing     pneumonia. 5. Hypokalemia:  The patient's potassium was fully repleted. 6. Hyponatremia:  The patient's low sodium has resolved. 7. History of tick exposure:  The patient was empirically treated with     doxycycline.  [facility] spotted fever titers were checked and     were negative and the doxycycline was discontinued. 8. Anemia secondary to acute illness:  The patient does have a mild     normocytic anemia consistent with anemia of acute illness.  His     hemoglobin and hematocrit have remained stable while in the      hospital.  DISPOSITION: The patient is not yet ready for discharge.  He will need to finish his course of IV antibiotics prior to discharge.  A discharge summary addendum will be dictated at the time of actual discharge.     Hillery Aldo, M.D.     CR/MEDQ  D:  10/28/2010  T:  10/28/2010  Job:  [id number]  cc:   Caryn Bee L. Little, M.D. Fax: [phone]  Kalman Shan, MD [address] [place] [address] [place] [address]  Ines Bloomer, M.D. [address] [place], [place] [address]  Electronically Signed by Hillery Aldo M.D. on 11/04/2010 03:44:02 PM

## 2010-11-05 ENCOUNTER — Encounter: Payer: Self-pay | Admitting: Adult Health

## 2010-11-06 ENCOUNTER — Encounter: Payer: Self-pay | Admitting: Adult Health

## 2010-11-06 ENCOUNTER — Ambulatory Visit (INDEPENDENT_AMBULATORY_CARE_PROVIDER_SITE_OTHER): Payer: 59 | Admitting: Adult Health

## 2010-11-06 ENCOUNTER — Ambulatory Visit (INDEPENDENT_AMBULATORY_CARE_PROVIDER_SITE_OTHER)
Admission: RE | Admit: 2010-11-06 | Discharge: 2010-11-06 | Disposition: A | Discharge status: Home / Self Care | Payer: 59 | Source: Ambulatory Visit | Attending: Adult Health | Admitting: Adult Health

## 2010-11-06 VITALS — BP 126/76 | HR 115 | Temp 97.7°F | Ht 70.0 in | Wt 219.0 lb

## 2010-11-06 DIAGNOSIS — J479 Bronchiectasis, uncomplicated: Secondary | ICD-10-CM

## 2010-11-06 DIAGNOSIS — J189 Pneumonia, unspecified organism: Secondary | ICD-10-CM | POA: Insufficient documentation

## 2010-11-06 DIAGNOSIS — J852 Abscess of lung without pneumonia: Secondary | ICD-10-CM

## 2010-11-06 MED ORDER — PROMETHAZINE-CODEINE 6.25-10 MG/5ML PO SYRP: 5.0000 mL | ORAL_SOLUTION | Freq: Four times a day (QID) | ORAL | Status: DC | PRN

## 2010-11-06 NOTE — Patient Instructions (Signed)
Continue on antibiotics as planned. Yogurt daily  Prilosec 20mg  daily before meals.  GERD diet.  follow up Dr. Marchelle Gearing or [place] in 1 week  Please contact office for sooner follow up if symptoms do not improve or worsen or seek emergency care  follow up Dr. Edwyna Shell as planned and As needed

## 2010-11-06 NOTE — Consult Note (Signed)
[name], [name] NO.:  [id number]  MEDICAL RECORD NO.:  [id number]  LOCATION:  1422                         FACILITY:  [facility]  PHYSICIAN:  Kalman Shan, MD   DATE OF BIRTH:  April 08, 1955  DATE OF CONSULTATION: DATE OF DISCHARGE:                                CONSULTATION   REFERRING PHYSICIAN:  Brendia Sacks, MD, and Hillery Aldo, MD, of [facility] hospitalist.  REASON FOR CONSULTATION:  Recurrent right lung pneumonia.  HISTORY OF PRESENT ILLNESS:  [name] is a 55 year old male, nonsmoker, with past medical history of recurrent pneumonias, known history of "birth defect" in the right lung, later expresses "right middle lobe syndrome" with episodes of pneumonia, treated in the right lung, treated as an outpatient in the past.  October 17, 2010, Friday, he started with fever, chills, flu-like symptoms, and decreased energy. Sunday, October 19, 2010, began with right back pain and cough.  Saw primary care physician at [facility].  Chest x-ray showed "pneumonia" and white count was 17,000.  Had multiple episodes of coughing, vomiting due to forceful cough.  This progressed to bloody sputum and the patient presented to the emergency room.  The main symptom was cough.  He rated this as severe.  The course was progressive,  associated with hemoptysis and sputum, and also some shortness of breath and fever and chills. Denied wheezing.  Denied any associated weight loss, edema, syncope, spells. The associated sputum was of brown-yellow color with associated hemoptysis, moderate to large volume overall.  The onset itself was acute.  PAST MEDICAL HISTORY: 1. Recurrent pneumonia in the right lung, according to my nurse     practitioner secondary to hypoplasia of the right middle lobe.  The     patient himself confirmed using the words right middle lobe     syndrome.  He says that the first time he had pneumonia was in     [age].  He has had since then approximately 1  episode of pneumonia     each year, treated as an outpatient.  He has had 2 episodes in 2011     and 2012 each.  All treated again as an outpatient.  He says each     time he has had a chest x-ray he also recollects a bronchoscopy in     the remote past over 10 years ago by Dr. Peggye Form and this     showed pus.  At that time, a lobectomy was recommended, but he     decided against it.  He subsequently saw Dr. Jetty Duhamel for a     few years in the previous practice, but in the last 10 years did     not followup with Dr. Maple Hudson. 2. Hypertension, not on ACE inhibitors. 3. Multiple back surgeries, DJD, cervical fusion, plate removed. 4. Left knee arthroscopy, a torn meniscus.  SOCIAL HISTORY:  He lives in [place], retired Acupuncturist. Lives with wife.  No inside animals, never smoker.  Rare to occasional alcohol only.  MEDICATIONS:  Hydrochlorothiazide, hydrocodone, AndroGel.  CURRENT MEDICATIONS:  Cefepime and Vibramycin for possible [facility] spotted fever.  ALLERGIES:  MORPHINE, he has got nausea  and vomiting.  FAMILY HISTORY:  Mother died of stroke in 08/03/04 and Alzheimer's.  Father had liver disease.  Has 2 siblings, one possibly has right middle lobe issues.  REVIEW OF SYSTEMS:  He is a full code.  Positive for fever, chills, fatigue.  Posterior chest/back pain, cough, sputum, hemoptysis, and some nausea.  Speech therapist, Jerolyn Shin, evaluated the patient and he has reflux symptoms score of 29/45 which is strongly positive for laryngopharyngeal reflux. He admits to chronic vomiting after cough and also he has had a previous trial of Nexium, unclear what the response was, but he frequently abuses Tums.  In addition, his sputum is copious in amount, has yellow-brown and red color mixed.  Significant hemoptysis yesterday, but is clear today after admission of antibiotics.  Overall, he is currently feeling better since admission.  Rest of 11-point  review of systems is reviewed and is otherwise negative.  PHYSICAL EXAMINATION:  VITAL SIGNS:  At admission, temperature was 102.9, but subsequently is dropped to 98.9; blood pressure is 122/72; heart rate of 86; respiratory rate 18; 96% on 2 L nasal cannula. GENERAL:  He is overweight male, looks chronically unwell, nontoxic appearing. PSYCHIATRIC:  He has a flat affect. NEUROLOGIC:  He is alert and oriented x3.  Speech is normal.  Moves all 4 extremities normally.  Face exam is normal. EYES:  Pupils are equal and reactive to light.  Nonicteric.  No nystagmus. ORAL:  He has got very poor dentition.  Last time he saw dentist was several years ago, possible cavities as well. NECK:  Supple.  No neck nodes.  No elevated JVP.  There is a scar from previous surgery. CARDIOVASCULAR:  Regular rate and rhythm, normal cardiac.  Pulses are equal.  No murmurs.  No gallops. CHEST:  Normal shape chest.  No scars. PULMONARY:  Respirations are even and nonlabored.  The right lung base has diminished air entry with wheezes.  The percussion note that it is dull. ABDOMEN:  Soft, nontender.  No organomegaly.  Normal bowel sounds. MUSCULOSKELETAL:  No joint deformities.  Moves all 4 extremities equally. SKIN:  No clubbing.  No cyanosis.  No pedal edema.  LABORATORY EVALUATION:  A CT scan of the chest done, October 22, 2010, shows an 8 cm right lower lobe lung abscess with fluid level.  Other labs, 1. Magnesium 1.9. 2. White count today 6.9. 3. Creatinine 0.65. 4. Sputum culture, July 30th, normal flora.  ASSESSMENT: 1. Right lower lobe lung access. 2. Etiology for above include poor dentition. 3. Other etiology for right lower lobe lung access is gastroesophageal     reflux disease/laryngopharyngeal reflux/aspiration. 4. There is no history of alcoholism or tobacco abuse to account for     right lower lobe lung access. 5. It is possible the right middle lobe syndrome is playing an     etiology  for right lower lobe lung access  PLAN: 1. Get sputum cup and monitor the volume of hemoptysis and sputum. 2. Change antibiotics to clindamycin IV and also gram-negative     coverage with ciprofloxacin for lung abscess. 3. Given the large size of the lung abscess, it is better to involve     Thoracic Surgery.  Dr. Trula Ore Rama is going to call them.   4. Expected complications include hemorrhage, liquefaction necrosis of     the lung for which Thoracic Surgery will need to be involved. 5. Prolonged antibiotic course will be required. 6. Address swallowing evaluation with Speech Therapy. 7.  Also consider dental consult in-house      Kalman Shan, MD     MR/MEDQ  D:  10/23/2010  T:  10/24/2010  Job:  [id number]  cc:   Joni Fears D. Maple Hudson, MD, FCCP, FACP [facility]-Pulmonary Dept [address] [place] [address]  Kandyce Rud, MD Fax: [phone]  Electronically Signed by Kalman Shan MD on 11/06/2010 09:17:59 AM

## 2010-11-10 ENCOUNTER — Encounter: Payer: Self-pay | Admitting: Adult Health

## 2010-11-10 ENCOUNTER — Telehealth: Payer: Self-pay | Admitting: Adult Health

## 2010-11-10 ENCOUNTER — Telehealth: Payer: Self-pay | Admitting: Internal Medicine

## 2010-11-10 NOTE — Telephone Encounter (Signed)
Called pt back to discuss issues, says that today he is feeling better w/ decreased fever and blood is mixed w/ mucus.  Have recommended he need admission to hospital however he has declined and wants to keep on his current abx.  Has assured me he will call back if not improving or worsens for sooner follow up or ER .  Please contact office for sooner follow up if symptoms do not improve or worsen or seek emergency care

## 2010-11-10 NOTE — Telephone Encounter (Signed)
Pt returning call also wants to add that he has been coughing up blood since this morning can be reached at [phone].Raylene Everts

## 2010-11-10 NOTE — Progress Notes (Signed)
Subjective:    Patient ID: [name], male    DOB: [birth year], 55 y.o.   MRN: [id number]  HPI 55 yo male never smoker  with seen for initial pulmonary consult 10/19/10 for RML, RLL PNA w/ abscess  11/06/10 Post Hospital  Pt presents for post hospital follow up . He was admitted 10/19/2010 - 10/29/2010  For a  Right middle lobe, right lower lobe pneumonia with abscess, status post bronchoscopy. ,  Right middle lobe hypoplasia, by history.  Questionable severe chronic obstructive pulmonary disease, status post PFTs. Will need repeat once stablized .   Periapical tooth abscess.  CT scan was performed, which did show an abscess in the right lung.  Was seen by  Cardiothoracic Surgery, also evaluated the patient and felt that he is likely to need resection in the future. He did undergo bronchoscopy as well as PFT which showed severe COPD.-(he is a never smoker)    He was noted to have periapical tooth abscess. He was seen by Dr. Kristin Bruins here, but it was not felt this was likely contributory to his lung abscess. Dr. Kristin Bruins will again help coordinate outpatient followup He underwent  Bronchoscopy, October 27, 2010, which shows thick purulent sputum  emanating from the right middle lobe. Neg cytology for malignancy.  PFTs showed FEV1 of 1.78 and FVC of 2.50.[name] CT chest, August 1 1: Right lower lobe consolidation with cystic and bronchiectasis changes with a 5.2 x 8 cm gas fluid collection compatible with pneumonia associated with infected bulla/abscess. [name], August 3: Question periapical abscess associated with the left lower second most posterior tooth. . Chest x-ray, August 8: Improving aeration of the right lung base. Persistent cavitary right lower lobe lesion with air-fluid level consistent with pulmonary abscess. Unchanged small right pleural effusion.   Since discharge reports breathing has improved but still having prod cough with yellow-dark brown mucus that causes right chest discomfort.   requesting refill on promethazine-codeine syrup. He was discharged on Clindamycin and Cipro with plans for prolonged abx.  Still feels weak and run down.     Review of Systems Constitutional:   No  weight loss, night sweats,  Fevers, chills, fatigue, or  lassitude.  HEENT:   No headaches,  Difficulty swallowing,  Tooth/dental problems, or  Sore throat,                No sneezing, itching, ear ache, nasal congestion, post nasal drip,   CV:  No chest pain,  Orthopnea, PND, swelling in lower extremities, anasarca, dizziness, palpitations, syncope.   GI  No heartburn, indigestion, abdominal pain, nausea, vomiting, diarrhea, change in bowel habits, loss of appetite, bloody stools.   Resp: No shortness of breath with exertion or at rest.  No excess mucus, no productive cough,  No non-productive cough,  No coughing up of blood.  No change in color of mucus.  No wheezing.  No chest wall deformity  Skin: no rash or lesions.  GU: no dysuria, change in color of urine, no urgency or frequency.  No flank pain, no hematuria   MS:  No joint pain or swelling.  No decreased range of motion.  No back pain.  Psych:  No change in mood or affect. No depression or anxiety.  No memory loss.         Objective:   Physical Exam GEN: A/Ox3; pleasant , NAD  HEENT:  [place]/AT,  EACs-clear, TMs-wnl, NOSE-clear, THROAT-clear, no lesions, no postnasal drip or exudate noted.  Very  poor dental care, multiple caries and broken teeth   NECK:  Supple w/ fair ROM; no JVD; normal carotid impulses w/o bruits; no thyromegaly or nodules palpated; no lymphadenopathy.  RESP  Coarse BS w/ few rhonchi, dimished BS in bases .no accessory muscle use, no dullness to percussion  CARD:  RRR, no m/r/g  , no peripheral edema, pulses intact, no cyanosis or clubbing.  GI:   Soft & nt; nml bowel sounds; no organomegaly or masses detected.  Musco: Warm bil, no deformities or joint swelling noted.   Neuro: alert, no focal deficits  noted.    Skin: Warm, no lesions or rashes   CXR : 8/16  Slight increase in small right effusion since the prior study.  Persistent lung abscess and severe bronchiectasis at the right lung  base.         Assessment & Plan:

## 2010-11-10 NOTE — Assessment & Plan Note (Addendum)
RML/RLL PNA w/ lung abscess s/p FOB w/ neg cytology for malignancy.  Tx w/ prolonged abx w/ cleocin and cipro.  He is to follow up with Vascular surgeon for consideration of resection  Plan:  Continue on antibiotics as planned. Yogurt daily  Prilosec 20mg  daily before meals.  GERD diet.  follow up Dr. Marchelle Gearing  In  1 week  And As needed   Please contact office for sooner follow up if symptoms do not improve or worsen or seek emergency care  follow up Dr. Edwyna Shell as planned and As needed   follow up with dentist in next couple of weeks.

## 2010-11-10 NOTE — Telephone Encounter (Signed)
Per TP, needs ov for eval or go to hospital as dicussed earlier. Spoke with [name] and sched him to see MW at 9 am tomorrow. I advised if symptoms persist/worsen needs to seek emergency care asap, she verbalized understanding.

## 2010-11-10 NOTE — Telephone Encounter (Signed)
Called spoke with patient who reports coughing so hard he vomited last night after eating dinner, fever up to 101.5 > took ibuprofen.  Coughed again this morning with "spitting up" bright red blood mixed with sputum x72minutes with an estimated "a couple of ounces" amount > "very liquid."  Now mucus is yellow/brown with some bright red mixed in "every 15-20 minutes."  Temp normal.  Reports no increased SOB, wheezing.  Pt has appt with Dr Edwyna Shell on 9.4.12.  Will discuss with TP and call pt back.  Pt okay with this and verbalized his understanding.

## 2010-11-10 NOTE — Telephone Encounter (Signed)
Spoke with pt's spouse. She states that TP had spoke with pt earlier and she had rec that he be admitted. Pt did not want this, and so decided to just cont on with abx and see how he feels. Spouse wonders if prednisone would help with his symptoms. I advised he probably needs to just come back in for ov, or possibly just go to ER for admission. Will discuss this with TP and then call her back.

## 2010-11-10 NOTE — Telephone Encounter (Signed)
Error.[name] ° °

## 2010-11-11 ENCOUNTER — Encounter: Payer: Self-pay | Admitting: Internal Medicine

## 2010-11-11 ENCOUNTER — Ambulatory Visit (INDEPENDENT_AMBULATORY_CARE_PROVIDER_SITE_OTHER): Payer: 59 | Admitting: Internal Medicine

## 2010-11-11 DIAGNOSIS — J852 Abscess of lung without pneumonia: Secondary | ICD-10-CM

## 2010-11-11 DIAGNOSIS — J479 Bronchiectasis, uncomplicated: Secondary | ICD-10-CM

## 2010-11-11 NOTE — Assessment & Plan Note (Signed)
Needs alpha one genotype screening to be complete as never smoked   Cough seem disproportionate to objective dz and cough to point of gagging and vomiting means he's definitely got secondary reflux,  A potential cyclical mechanism as reflux then causes more cough.     I had an extended discussion with the patient today lasting 15 to 20 minutes of a 25 minute visit on the following issues: See instructions for specific recommendations which were reviewed directly with the patient who was given a copy with highlighter outlining the key components.

## 2010-11-11 NOTE — Assessment & Plan Note (Signed)
No change rx needed at this point but if condition worsens may need to consider hospitalization for IV ABX (vs Home rx) with primaxin

## 2010-11-11 NOTE — Progress Notes (Signed)
Subjective:    Patient ID: [name], male    DOB: 1955-12-12, 55 y.o.   MRN: [id number]  HPI 55 yo male never smoker  with seen for initial pulmonary consult 10/19/10 for RML, RLL PNA w/ abscess in setting of longstanding bronchiectasis.  11/06/10 Post Hospital  Pt presents for post hospital follow up . He was admitted 10/19/2010 - 10/29/2010  For a  Right middle lobe, right lower lobe pneumonia with abscess, status post bronchoscopy. ,  Right middle lobe hypoplasia, by history.  Questionable severe chronic obstructive pulmonary disease, status post PFTs. Will need repeat once stablized .   Periapical tooth abscess.  CT scan was performed, which did show an abscess in the right lung.  Was seen by  Cardiothoracic Surgery, also evaluated the patient and felt that he is likely to need resection in the future. He did undergo bronchoscopy as well as PFT which showed severe COPD.-(he is a never smoker)    He was noted to have periapical tooth abscess. He was seen by Dr. Kristin Bruins here, but it was not felt this was likely contributory to his lung abscess. Dr. Kristin Bruins will again help coordinate outpatient followup He underwent  Bronchoscopy, October 27, 2010, which shows thick purulent sputum  emanating from the right middle lobe. Neg cytology for malignancy.  PFTs showed FEV1 of 1.78 and FVC of 2.50.[name] CT chest, August 1 1: Right lower lobe consolidation with cystic and bronchiectasis changes with a 5.2 x 8 cm gas fluid collection compatible with pneumonia associated with infected bulla/abscess. [name], August 3: Question periapical abscess associated with the left lower second most posterior tooth. . Chest x-ray, August 8: Improving aeration of the right lung base. Persistent cavitary right lower lobe lesion with air-fluid level consistent with pulmonary abscess. Unchanged small right pleural effusion.   Since discharge reports breathing has improved but still having prod cough with yellow-dark brown  mucus that causes right chest discomfort.  requesting refill on promethazine-codeine syrup. He was discharged on Clindamycin and Cipro with plans for prolonged abx.  Still feels weak and run down.  rec Continue on antibiotics as planned. Yogurt daily  Prilosec 20mg  daily before meals.  GERD diet.    11/11/2010 f/u ov/[name] cc minimally improved with severe cough to point of vomiting but fever appears to have abated and no diarrhea or abd pain on clindamycin.  R side chest discomfort during severe cough only. No resting sob  Pt denies any significant sore throat, dysphagia, itching, sneezing,  nasal congestion or excess/ purulent secretions,  shaking chills, sweats, unintended wt loss,  exertional cp, hempoptysis, orthopnea pnd or leg swelling.    Also denies any obvious fluctuation of symptoms with weather or environmental changes or other aggravating or alleviating factors.              Objective:   Physical Exam  GEN: A/Ox3; pleasant , NAD  Wt 219 > 222 11/11/2010  HEENT:  [place]/AT,  EACs-clear, TMs-wnl, NOSE-clear, THROAT-clear, no lesions, no postnasal drip or exudate noted.  Very poor dental care, multiple caries and broken teeth   NECK:  Supple w/ fair ROM; no JVD; normal carotid impulses w/o bruits; no thyromegaly or nodules palpated; no lymphadenopathy.  RESP  Coarse BS w/ few rhonchi, dimished BS in bases .no accessory muscle use, no dullness to percussion  CARD:  RRR, no m/r/g  , no peripheral edema, pulses intact, no cyanosis or clubbing.  GI:   Soft & nt; nml bowel sounds;  no organomegaly or masses detected.  Musco: Warm bil, no deformities or joint swelling noted.   Neuro: alert, no focal deficits noted.    Skin: Warm, no lesions or rashes   CXR : 11/06/10 Slight increase in small right effusion since the prior study.  Persistent lung abscess and severe bronchiectasis at the right lung  base.         Assessment & Plan:

## 2010-11-11 NOTE — Patient Instructions (Addendum)
GERD (REFLUX)  is an extremely common cause of respiratory symptoms, many times with no significant heartburn at all.    It can be treated with medication, but also with lifestyle changes including avoidance of late meals, excessive alcohol, smoking cessation, and avoid fatty foods, chocolate, peppermint, colas, red wine, and acidic juices such as orange juice.  NO MINT OR MENTHOL PRODUCTS SO NO COUGH DROPS  USE SUGARLESS CANDY INSTEAD (jolley ranchers or Stover's)  NO OIL BASED VITAMINS (especially fish oil)  Increase Try prilosec 20mg   Take two  30-60 min before first meal of the day and Pepcid 20 mg one bedtime until cough is completely gone for at least a week without the need for cough suppression  Continue mucinex dm 1200mg  twice daily = total 2400  Mg in 24 hours then supplement with the codeine syrup.  We will send your blood for alpha one antitrypsin.

## 2010-11-11 NOTE — Telephone Encounter (Signed)
Yesterday when I d/w Tammy PArret I strongly felt he probably has non resolving lung abscess and admission with view towards surgery would be in his best interest. Looks like he declined admission at his own risk

## 2010-11-19 ENCOUNTER — Other Ambulatory Visit: Payer: Self-pay | Admitting: Thoracic Surgery

## 2010-11-19 ENCOUNTER — Ambulatory Visit: Payer: 59 | Admitting: Thoracic Surgery

## 2010-11-19 DIAGNOSIS — C3430 Malignant neoplasm of lower lobe, unspecified bronchus or lung: Secondary | ICD-10-CM

## 2010-11-20 ENCOUNTER — Telehealth: Payer: Self-pay | Admitting: Internal Medicine

## 2010-11-20 NOTE — Telephone Encounter (Signed)
Spoke with pt and he states Angelique Blonder left him a message that he needed to follow up within 1 week of tp OV 8/16. Spoke with pt and he came in and saw MW on 8/21. Pt made an apt with MR 12/08/10 at 4:15 to follow up. Pt is going to meet with his surgeon 11/25/10 and states he will call back if he needs surgery clearance. Nothing further was needed.

## 2010-11-25 ENCOUNTER — Ambulatory Visit (INDEPENDENT_AMBULATORY_CARE_PROVIDER_SITE_OTHER): Payer: 59 | Admitting: Thoracic Surgery

## 2010-11-25 ENCOUNTER — Ambulatory Visit
Admission: RE | Admit: 2010-11-25 | Discharge: 2010-11-25 | Disposition: A | Discharge status: Home / Self Care | Payer: 59 | Source: Ambulatory Visit | Attending: Thoracic Surgery | Admitting: Thoracic Surgery

## 2010-11-25 ENCOUNTER — Ambulatory Visit: Payer: 59 | Admitting: Thoracic Surgery

## 2010-11-25 ENCOUNTER — Encounter: Payer: Self-pay | Admitting: Thoracic Surgery

## 2010-11-25 VITALS — BP 146/87 | HR 100 | Resp 18 | Ht 70.0 in | Wt 224.0 lb

## 2010-11-25 DIAGNOSIS — C3430 Malignant neoplasm of lower lobe, unspecified bronchus or lung: Secondary | ICD-10-CM

## 2010-11-25 DIAGNOSIS — M1990 Unspecified osteoarthritis, unspecified site: Secondary | ICD-10-CM | POA: Insufficient documentation

## 2010-11-25 DIAGNOSIS — I10 Essential (primary) hypertension: Secondary | ICD-10-CM | POA: Insufficient documentation

## 2010-11-25 DIAGNOSIS — J851 Abscess of lung with pneumonia: Secondary | ICD-10-CM | POA: Insufficient documentation

## 2010-11-25 DIAGNOSIS — J852 Abscess of lung without pneumonia: Secondary | ICD-10-CM

## 2010-11-25 LAB — FUNGUS CULTURE W SMEAR: Fungal Smear: NONE SEEN

## 2010-11-25 MED ORDER — PROMETHAZINE-CODEINE 6.25-10 MG/5ML PO SYRP: 5.0000 mL | ORAL_SOLUTION | Freq: Four times a day (QID) | ORAL | Status: DC | PRN

## 2010-11-25 MED ORDER — CLINDAMYCIN HCL 150 MG PO CAPS: 150.0000 mg | ORAL_CAPSULE | Freq: Three times a day (TID) | ORAL | Status: DC

## 2010-11-25 MED ORDER — CIPROFLOXACIN HCL 500 MG PO TABS: 500.0000 mg | ORAL_TABLET | Freq: Two times a day (BID) | ORAL | Status: DC

## 2010-11-25 NOTE — Progress Notes (Signed)
HPI this patient returns today after being treated for an abscess in the right lower lobe in August. He was admitted to the hospital in with severe pneumonia with abscess in the right lower lobe. He has a long history of chronic right middle lobe and right lower lobe pneumonia. CT scan showed a destroyed right lower lobe. He is doing a lot better since he's been put on suppressive antibiotics. He comes in today to discuss the right lower lobectomy. I discussed the risk and benefits of the right lower lobectomy for a destroyed right lower lobe. We may have to do a right middle lobectomy but I think avoid a middle lobectomy. The risks and benefits of the procedure were explained to him and he agrees with the surgery. We gave him a refill for clindamycin Cipro and Phenergan With Codeine.  Current Outpatient Prescriptions  Medication Sig Dispense Refill  . ANDROGEL PUMP 20.25 MG/ACT (1.62%) GEL Once daily.      . ciprofloxacin (CIPRO) 500 MG tablet Take 1 tablet (500 mg total) by mouth 2 (two) times daily.  20 tablet  0  . clindamycin (CLEOCIN) 150 MG capsule Take 1 capsule (150 mg total) by mouth 3 (three) times daily.  30 capsule  0  . famotidine (PEPCID) 20 MG tablet Take 20 mg by mouth 2 (two) times daily.        [name] guaiFENesin-dextromethorphan (ROBITUSSIN DM) 100-10 MG/5ML syrup Per bottle       . HYDROcodone-acetaminophen (VICODIN ES) 7.5-750 MG per tablet Take 1 tablet by mouth Three times daily as needed.      . meloxicam (MOBIC) 15 MG tablet as needed.      [name] omeprazole (PRILOSEC OTC) 20 MG tablet Take 20 mg by mouth daily.        . Probiotic Product (PROBIOTIC FORMULA PO) Take 1 capsule by mouth daily.        . promethazine-codeine (PHENERGAN WITH CODEINE) 6.25-10 MG/5ML syrup Take 5 mLs by mouth every 6 (six) hours as needed.  240 mL  0      Physical Exam  Cardiovascular: Normal rate, regular rhythm and normal heart sounds.   Pulmonary/Chest: Effort normal and breath sounds normal. No  respiratory distress. He has no wheezes.     Diagnostic tests: Chest x-ray shows atelectasis and some inflammation in the right lower lobe.   Impression: Bronchiectasis right lower lobe with abscess.   Plan: Right lower lobectomy.

## 2010-12-01 ENCOUNTER — Telehealth: Payer: Self-pay | Admitting: Internal Medicine

## 2010-12-01 NOTE — Telephone Encounter (Signed)
Need to inform pt that Alpha 1 was neg- LMTCB

## 2010-12-01 NOTE — Telephone Encounter (Signed)
Spoke with pt and notified of results per Dr. Wert. Pt verbalized understanding and denied any questions. 

## 2010-12-03 ENCOUNTER — Other Ambulatory Visit: Payer: Self-pay | Admitting: Thoracic Surgery

## 2010-12-03 ENCOUNTER — Ambulatory Visit (HOSPITAL_COMMUNITY)
Admission: RE | Admit: 2010-12-03 | Discharge: 2010-12-03 | Disposition: A | Discharge status: Home / Self Care | Payer: 59 | Source: Ambulatory Visit | Attending: Thoracic Surgery | Admitting: Thoracic Surgery

## 2010-12-03 ENCOUNTER — Encounter (HOSPITAL_COMMUNITY)
Admission: RE | Admit: 2010-12-03 | Discharge: 2010-12-03 | Disposition: A | Discharge status: Home / Self Care | Payer: 59 | Source: Ambulatory Visit | Attending: Thoracic Surgery | Admitting: Thoracic Surgery

## 2010-12-03 DIAGNOSIS — R918 Other nonspecific abnormal finding of lung field: Secondary | ICD-10-CM

## 2010-12-03 DIAGNOSIS — Z01810 Encounter for preprocedural cardiovascular examination: Secondary | ICD-10-CM | POA: Insufficient documentation

## 2010-12-03 DIAGNOSIS — J984 Other disorders of lung: Secondary | ICD-10-CM | POA: Insufficient documentation

## 2010-12-03 DIAGNOSIS — Z01818 Encounter for other preprocedural examination: Secondary | ICD-10-CM | POA: Insufficient documentation

## 2010-12-03 DIAGNOSIS — Z01812 Encounter for preprocedural laboratory examination: Secondary | ICD-10-CM | POA: Insufficient documentation

## 2010-12-03 LAB — COMPREHENSIVE METABOLIC PANEL
ALT: 18 U/L (ref 0–53)
AST: 15 U/L (ref 0–37)
Albumin: 3.9 g/dL (ref 3.5–5.2)
Alkaline Phosphatase: 90 U/L (ref 39–117)
BUN: 18 mg/dL (ref 6–23)
CO2: 23 mEq/L (ref 19–32)
Calcium: 9.5 mg/dL (ref 8.4–10.5)
Chloride: 102 mEq/L (ref 96–112)
Creatinine, Ser: 0.77 mg/dL (ref 0.50–1.35)
GFR calc Af Amer: >60 mL/min (ref >60)
GFR calc non Af Amer: >60 mL/min (ref >60)
Glucose, Bld: 96 mg/dL (ref 70–99)
Potassium: 3.9 mEq/L (ref 3.5–5.1)
Sodium: 136 mEq/L (ref 135–145)
Total Bilirubin: 0.4 mg/dL (ref 0.3–1.2)
Total Protein: 7.3 g/dL (ref 6.0–8.3)

## 2010-12-03 LAB — PROTIME-INR
INR: 0.94 (ref 0.00–1.49)
Prothrombin Time: 12.8 seconds (ref 11.6–15.2)

## 2010-12-03 LAB — URINALYSIS, ROUTINE W REFLEX MICROSCOPIC
Bilirubin Urine: NEGATIVE
Glucose, UA: NEGATIVE mg/dL
Hgb urine dipstick: NEGATIVE
Ketones, ur: NEGATIVE mg/dL
Leukocytes, UA: NEGATIVE
Nitrite: NEGATIVE
Protein, ur: NEGATIVE mg/dL
Specific Gravity, Urine: 1.024 (ref 1.005–1.030)
Urobilinogen, UA: 0.2 mg/dL (ref 0.0–1.0)
pH: 5 (ref 5.0–8.0)

## 2010-12-03 LAB — BLOOD GAS, ARTERIAL
Acid-base deficit: 1 mmol/L (ref 0.0–2.0)
Bicarbonate: 22.8 mEq/L (ref 20.0–24.0)
FIO2: 0.21 %
O2 Saturation: 98 %
Patient temperature: 98.6
TCO2: 23.9 mmol/L (ref 0–100)
pCO2 arterial: 35.7 mmHg (ref 35.0–45.0)
pH, Arterial: 7.421 (ref 7.350–7.450)
pO2, Arterial: 92.1 mmHg (ref 80.0–100.0)

## 2010-12-03 LAB — CBC
HCT: 41.2 % (ref 39.0–52.0)
Hemoglobin: 14.5 g/dL (ref 13.0–17.0)
MCH: 29.9 pg (ref 26.0–34.0)
MCHC: 35.2 g/dL (ref 30.0–36.0)
MCV: 84.9 fL (ref 78.0–100.0)
Platelets: 192 10*3/uL (ref 150–400)
RBC: 4.85 MIL/uL (ref 4.22–5.81)
RDW: 13.1 % (ref 11.5–15.5)
WBC: 8 10*3/uL (ref 4.0–10.5)

## 2010-12-03 LAB — SURGICAL PCR SCREEN
MRSA, PCR: NEGATIVE
Staphylococcus aureus: NEGATIVE

## 2010-12-03 LAB — APTT: aPTT: 29 seconds (ref 24–37)

## 2010-12-03 LAB — ABO/RH: ABO/RH(D): O POS

## 2010-12-05 ENCOUNTER — Inpatient Hospital Stay (HOSPITAL_COMMUNITY): Payer: 59

## 2010-12-05 ENCOUNTER — Other Ambulatory Visit: Payer: Self-pay | Admitting: Thoracic Surgery

## 2010-12-05 ENCOUNTER — Inpatient Hospital Stay (HOSPITAL_COMMUNITY)
Admission: RE | Admit: 2010-12-05 | Discharge: 2010-12-10 | DRG: 164 | Disposition: A | Discharge status: Home / Self Care | Payer: 59 | Source: Ambulatory Visit | Attending: Thoracic Surgery | Admitting: Thoracic Surgery

## 2010-12-05 DIAGNOSIS — Z886 Allergy status to analgesic agent status: Secondary | ICD-10-CM

## 2010-12-05 DIAGNOSIS — J479 Bronchiectasis, uncomplicated: Secondary | ICD-10-CM | POA: Diagnosis present

## 2010-12-05 DIAGNOSIS — J9819 Other pulmonary collapse: Secondary | ICD-10-CM | POA: Diagnosis present

## 2010-12-05 DIAGNOSIS — I10 Essential (primary) hypertension: Secondary | ICD-10-CM | POA: Diagnosis present

## 2010-12-05 DIAGNOSIS — Z8619 Personal history of other infectious and parasitic diseases: Secondary | ICD-10-CM

## 2010-12-05 DIAGNOSIS — J4489 Other specified chronic obstructive pulmonary disease: Secondary | ICD-10-CM | POA: Diagnosis present

## 2010-12-05 DIAGNOSIS — Z8701 Personal history of pneumonia (recurrent): Secondary | ICD-10-CM

## 2010-12-05 DIAGNOSIS — J852 Abscess of lung without pneumonia: Secondary | ICD-10-CM

## 2010-12-05 DIAGNOSIS — K219 Gastro-esophageal reflux disease without esophagitis: Secondary | ICD-10-CM | POA: Diagnosis present

## 2010-12-05 DIAGNOSIS — J449 Chronic obstructive pulmonary disease, unspecified: Secondary | ICD-10-CM | POA: Diagnosis present

## 2010-12-05 HISTORY — PX: OTHER SURGICAL HISTORY: SHX169

## 2010-12-06 ENCOUNTER — Inpatient Hospital Stay (HOSPITAL_COMMUNITY): Payer: 59

## 2010-12-06 LAB — POCT I-STAT, CHEM 8
BUN: 13 mg/dL (ref 6–23)
Calcium, Ion: 1.2 mmol/L (ref 1.12–1.32)
Chloride: 97 mEq/L (ref 96–112)
Creatinine, Ser: 0.8 mg/dL (ref 0.50–1.35)
Glucose, Bld: 141 mg/dL — ABNORMAL HIGH (ref 70–99)
HCT: 37 % — ABNORMAL LOW (ref 39.0–52.0)
Hemoglobin: 12.6 g/dL — ABNORMAL LOW (ref 13.0–17.0)
Potassium: 3.4 mEq/L — ABNORMAL LOW (ref 3.5–5.1)
Sodium: 135 mEq/L (ref 135–145)
TCO2: 27 mmol/L (ref 0–100)

## 2010-12-06 LAB — CBC
HCT: 35.8 % — ABNORMAL LOW (ref 39.0–52.0)
Hemoglobin: 12.4 g/dL — ABNORMAL LOW (ref 13.0–17.0)
MCH: 29.7 pg (ref 26.0–34.0)
MCHC: 34.6 g/dL (ref 30.0–36.0)
MCV: 85.9 fL (ref 78.0–100.0)
Platelets: 155 10*3/uL (ref 150–400)
RBC: 4.17 MIL/uL — ABNORMAL LOW (ref 4.22–5.81)
RDW: 13.3 % (ref 11.5–15.5)
WBC: 10 10*3/uL (ref 4.0–10.5)

## 2010-12-06 LAB — POCT I-STAT 3, ART BLOOD GAS (G3+)
Acid-Base Excess: 2 mmol/L (ref 0.0–2.0)
Bicarbonate: 27.9 mEq/L — ABNORMAL HIGH (ref 20.0–24.0)
O2 Saturation: 96 %
Patient temperature: 98.1
TCO2: 29 mmol/L (ref 0–100)
pCO2 arterial: 47.4 mmHg — ABNORMAL HIGH (ref 35.0–45.0)
pH, Arterial: 7.377 (ref 7.350–7.450)
pO2, Arterial: 85 mmHg (ref 80.0–100.0)

## 2010-12-06 LAB — BASIC METABOLIC PANEL
BUN: 14 mg/dL (ref 6–23)
CO2: 29 mEq/L (ref 19–32)
Calcium: 8.8 mg/dL (ref 8.4–10.5)
Chloride: 99 mEq/L (ref 96–112)
Creatinine, Ser: 0.62 mg/dL (ref 0.50–1.35)
GFR calc Af Amer: >60 mL/min (ref >60)
GFR calc non Af Amer: >60 mL/min (ref >60)
Glucose, Bld: 115 mg/dL — ABNORMAL HIGH (ref 70–99)
Potassium: 3.6 mEq/L (ref 3.5–5.1)
Sodium: 134 mEq/L — ABNORMAL LOW (ref 135–145)

## 2010-12-07 ENCOUNTER — Inpatient Hospital Stay (HOSPITAL_COMMUNITY): Payer: 59

## 2010-12-07 LAB — COMPREHENSIVE METABOLIC PANEL
ALT: 11 U/L (ref 0–53)
AST: 13 U/L (ref 0–37)
Albumin: 2.6 g/dL — ABNORMAL LOW (ref 3.5–5.2)
Alkaline Phosphatase: 73 U/L (ref 39–117)
BUN: 11 mg/dL (ref 6–23)
CO2: 29 mEq/L (ref 19–32)
Calcium: 8.6 mg/dL (ref 8.4–10.5)
Chloride: 100 mEq/L (ref 96–112)
Creatinine, Ser: 0.63 mg/dL (ref 0.50–1.35)
GFR calc Af Amer: >60 mL/min (ref >60)
GFR calc non Af Amer: >60 mL/min (ref >60)
Glucose, Bld: 130 mg/dL — ABNORMAL HIGH (ref 70–99)
Potassium: 3.6 mEq/L (ref 3.5–5.1)
Sodium: 136 mEq/L (ref 135–145)
Total Bilirubin: 0.5 mg/dL (ref 0.3–1.2)
Total Protein: 6.2 g/dL (ref 6.0–8.3)

## 2010-12-07 LAB — CBC
HCT: 35.2 % — ABNORMAL LOW (ref 39.0–52.0)
Hemoglobin: 11.8 g/dL — ABNORMAL LOW (ref 13.0–17.0)
MCH: 29.3 pg (ref 26.0–34.0)
MCHC: 33.5 g/dL (ref 30.0–36.0)
MCV: 87.3 fL (ref 78.0–100.0)
Platelets: 152 10*3/uL (ref 150–400)
RBC: 4.03 MIL/uL — ABNORMAL LOW (ref 4.22–5.81)
RDW: 13.8 % (ref 11.5–15.5)
WBC: 10 10*3/uL (ref 4.0–10.5)

## 2010-12-08 ENCOUNTER — Inpatient Hospital Stay (HOSPITAL_COMMUNITY): Payer: 59

## 2010-12-08 ENCOUNTER — Ambulatory Visit: Payer: Self-pay | Admitting: Internal Medicine

## 2010-12-08 ENCOUNTER — Encounter: Payer: Self-pay | Admitting: Internal Medicine

## 2010-12-08 LAB — CBC
HCT: 35.5 % — ABNORMAL LOW (ref 39.0–52.0)
Hemoglobin: 11.9 g/dL — ABNORMAL LOW (ref 13.0–17.0)
MCH: 29.5 pg (ref 26.0–34.0)
MCHC: 33.5 g/dL (ref 30.0–36.0)
MCV: 88.1 fL (ref 78.0–100.0)
Platelets: 174 10*3/uL (ref 150–400)
RBC: 4.03 MIL/uL — ABNORMAL LOW (ref 4.22–5.81)
RDW: 13.5 % (ref 11.5–15.5)
WBC: 8.2 10*3/uL (ref 4.0–10.5)

## 2010-12-08 LAB — BASIC METABOLIC PANEL
BUN: 13 mg/dL (ref 6–23)
CO2: 32 mEq/L (ref 19–32)
Calcium: 9.2 mg/dL (ref 8.4–10.5)
Chloride: 96 mEq/L (ref 96–112)
Creatinine, Ser: 0.62 mg/dL (ref 0.50–1.35)
GFR calc Af Amer: >60 mL/min (ref >60)
GFR calc non Af Amer: >60 mL/min (ref >60)
Glucose, Bld: 102 mg/dL — ABNORMAL HIGH (ref 70–99)
Potassium: 3.7 mEq/L (ref 3.5–5.1)
Sodium: 134 mEq/L — ABNORMAL LOW (ref 135–145)

## 2010-12-08 LAB — CULTURE, ROUTINE-ABSCESS: Culture: NO GROWTH

## 2010-12-09 ENCOUNTER — Inpatient Hospital Stay (HOSPITAL_COMMUNITY): Payer: 59

## 2010-12-09 LAB — AFB CULTURE WITH SMEAR (NOT AT ARMC): Acid Fast Smear: NONE SEEN

## 2010-12-09 LAB — TYPE AND SCREEN
ABO/RH(D): O POS
Antibody Screen: NEGATIVE
Unit division: 0
Unit division: 0

## 2010-12-09 NOTE — Op Note (Signed)
NAMEKYMARI, NUON NO.:  [id number]  MEDICAL RECORD NO.:  [id number]  LOCATION:  2899                         FACILITY:  [facility]  PHYSICIAN:  Ines Bloomer, M.D. DATE OF BIRTH:  1955/08/04  DATE OF PROCEDURE:  12/05/2010 DATE OF DISCHARGE:                              OPERATIVE REPORT   PREOPERATIVE DIAGNOSIS:  Right middle lobe syndrome, right lower lobe abscess.  POSTOPERATIVE DIAGNOSIS:  Right middle lobe syndrome, right lower lobe abscess.  OPERATION PERFORMED:  Right video-assisted thoracoscopic surgery, thoracotomy, right middle lobe resection, and wedge resection of posterior segment of right lower lobe.  SURGEON:  Ines Bloomer, M.D.  FIRST ASSISTANT:  Wayne EDoylene Canning PA-C.  DESCRIPTION OF PROCEDURE:  After percutaneous insertion of all monitoring lines, the patient underwent general anesthesia.  He was prepped and draped in the usual sterile manner.  He was turned to the right lateral thoracotomy position.  This 55 year old the patient had a long history right middle lobe pneumonia, had been treated multiple times and then get admitted to the hospital with a right lower lobe pneumonia in the posterior segment, which had abscessed.  We were able to treat it with IV antibiotics and p.o. antibiotics, but there was still evidence of some residual infection and it was decided to go ahead and resect this residual abscess as well as evaluate him and probably resect the right lower lobe.  After a dual-lumen tube inserted, the bronchoscope was passed down and the right upper lobe was normal. Bronchus intermedius and the right lower lobe orifices were normal, but there was marked narrowing of the right middle lobe.  We can get the scope into it, but it was markedly narrowed.  After the patient had been prepped and draped in sterile manner, two trocar sites were made in the anterior and posterior axillary line at the seventh intercostal space. Two  trocars were inserted and we could see the right lower lobe was completely contracted only a being about 25% of its capacity.  The adhesions of the right lower lobe to the chest wall and the right middle lobe was stuck to the epicardial fat.  We made an incision over the sixth intercostal space partially identified the latissimus reflecting serratus anteriorly, but small TPA in the space.  We then used a scope coming in medially and using a Covidien blue stapler laterally to divide one thick adhesions.  We freed up the inferior pulmonary ligament with electrocautery and then turned our attention to the mediastinum where we freed the right lower lobe of the mediastinum and then we freed the middle lobe and the anterior segment of the upper lobe off the mediastinum.  We had to use the Covidien stapler to divide the some of the adhesions.  We then turned our attention to the right lower lobe and palpated the abscess in the posterior basilar segment as well as in the posterior lateral segment.  We decided to do a resection of this using the Covidien black stapler and came across the abscess starting medially to laterally until we had resected essential wedge resection of the posterior basilar segment and part of the lateral basilar segment.  This was opened  and there was still frank pus in there and we cultured that aerobically and anaerobically.  We then turned our attention to the middle lobe and divided the minor fissure with a Covidien stapler and then dissected down to the lower portion of the middle lobe, dividing and dissecting the inferior portion of the major fissure and the minor fissure.  We then stapled that and came across that dividing the bronchus as well as the artery with a cold Covidien black stapler and removed essentially 90% of the right middle lobe.  We decided not to do a formal dissection down there because there was so much scarring in that particular area from  previous infection.  We then put ProGel on the staple line and two chest tubes, right-angle chest tube inferiorly and a straight chest tube anteriorly and On-Q was inserted in the usual fashion.  A Marcaine block in the usual fashion.  The chest was closed with three pericostal drilling through seventh rib and passed around the fifth rib, #1 Vicryl on the muscle layer and 2-0 Vicryl on the subcutaneous tissue.  The patient tolerated the procedure well and was returned to recovery room in stable condition.     Ines Bloomer, M.D.     DPB/MEDQ  D:  12/05/2010  T:  12/05/2010  Job:  [id number]  Electronically Signed by Jovita Gamma M.D. on 12/09/2010 01:49:30 PM

## 2010-12-09 NOTE — H&P (Signed)
  [name], [name] NO.:  [id number]  MEDICAL RECORD NO.:  [id number]  LOCATION:                                 FACILITY:  PHYSICIAN:  Ines Bloomer, M.D. DATE OF BIRTH:  02/05/1956  DATE OF ADMISSION:  12/05/2010 DATE OF DISCHARGE:                             HISTORY & PHYSICAL   CHIEF COMPLAINT:  Recurrent pneumonias.  HISTORY OF PRESENT ILLNESS:  This 55 year old Caucasian male who has recurrent pneumonias to his right middle lobe and right lower lobe and was recently found to have an abscess and what was thought to be the right lower lobe, underwent bronchoscopy by Dr. Vassie Loll that showed purulent material coming from the right middle lobe.  He responded to antibiotics and is now being admitted for possible right middle and right lower lobectomy to remove the recurrent area of recurrent pneumonia.  PAST MEDICAL HISTORY:  Hypertension.  He has had 9 back surgeries both cervical and lumbar and a left knee arthroscopy.  MEDICATIONS:  He is on AndroGel, hydrochlorothiazide, codeine for pain. He also has been on Cipro and clindamycin.  ALLERGIES:  MORPHINES cause him to have nausea.  FAMILY HISTORY:  Significant for vascular disease.  SOCIAL HISTORY:  Does not smoke.  He is a retired Art gallery manager.  Occasional alcohol.  REVIEW OF SYSTEMS:  His weight has been stable.  CARDIAC:  No angina or atrial fibrillation.  PULMONARY:  See history of present illness.  No hemoptysis.  GI:  No nausea, vomiting, constipation, diarrhea.  GU:  No kidney disease, dysuria, or frequent urination.  VASCULAR:  No claudication, DVTs, or TIAs.  NEUROLOGICAL:  No dizziness, headaches, blackouts, or seizures.  MUSCULOSKELETAL:  No arthritis.  PSYCHIATRIC: No depression or nervousness.  EYES/ENT:  No changes in eyesight or hearing.  HEMATOLOGICAL:  No problems with bleeding, clotting disorders, or anemia.  PHYSICAL EXAMINATION:  GENERAL:  He is a well-developed Caucasian  male, in no acute distress. VITAL SIGNS:  His blood pressure is 120/80, pulse 18, sats were 94%. HEENT:  Head is atraumatic.  Eyes:  Pupils are equal, reactive to light, and accommodation.  Extraocular movements normal.  Ears: Tympanic membranes are intact.  Nose:  There is no septal deviation.  Throat without lesion. CHEST:  Clear to auscultation and percussion on the left and as well as to the right. HEART:  Regular sinus rhythm.  No murmurs. ABDOMEN:  Soft.  There is no splenomegaly.  Bowel sounds are normal. EXTREMITIES:  Pulses are 2+.  There is no clubbing or edema. NEUROLOGICAL:  She is oriented x3.  Sensory and motor intact.  Cranial nerves are intact. SKIN:  Warm and dry.  IMPRESSION: 1. Right middle lobe and questionable right lower lobe bronchiectasis     with abscess. 2. Hypertension. 3. Chronic back pain and cervical disk disease.  PLAN:  Right VATS, right middle and possible right lower lobectomy.     Ines Bloomer, M.D.     DPB/MEDQ  D:  12/03/2010  T:  12/03/2010  Job:  [id number]  Electronically Signed by Jovita Gamma M.D. on 12/09/2010 01:49:27 PM

## 2010-12-10 ENCOUNTER — Inpatient Hospital Stay (HOSPITAL_COMMUNITY): Payer: 59

## 2010-12-10 LAB — ANAEROBIC CULTURE

## 2010-12-15 ENCOUNTER — Encounter: Payer: Self-pay | Admitting: Thoracic Surgery

## 2010-12-15 ENCOUNTER — Other Ambulatory Visit: Payer: Self-pay | Admitting: Thoracic Surgery

## 2010-12-15 DIAGNOSIS — C3430 Malignant neoplasm of lower lobe, unspecified bronchus or lung: Secondary | ICD-10-CM

## 2010-12-15 DIAGNOSIS — C342 Malignant neoplasm of middle lobe, bronchus or lung: Secondary | ICD-10-CM

## 2010-12-17 ENCOUNTER — Ambulatory Visit
Admission: RE | Admit: 2010-12-17 | Discharge: 2010-12-17 | Disposition: A | Discharge status: Home / Self Care | Payer: 59 | Source: Ambulatory Visit | Attending: Thoracic Surgery | Admitting: Thoracic Surgery

## 2010-12-17 ENCOUNTER — Encounter: Payer: Self-pay | Admitting: Thoracic Surgery

## 2010-12-17 ENCOUNTER — Ambulatory Visit (INDEPENDENT_AMBULATORY_CARE_PROVIDER_SITE_OTHER): Payer: Self-pay | Admitting: Thoracic Surgery

## 2010-12-17 VITALS — BP 144/90 | HR 86 | Resp 20 | Ht 70.0 in | Wt 222.0 lb

## 2010-12-17 DIAGNOSIS — C3430 Malignant neoplasm of lower lobe, unspecified bronchus or lung: Secondary | ICD-10-CM

## 2010-12-17 DIAGNOSIS — Z09 Encounter for follow-up examination after completed treatment for conditions other than malignant neoplasm: Secondary | ICD-10-CM

## 2010-12-17 DIAGNOSIS — C342 Malignant neoplasm of middle lobe, bronchus or lung: Secondary | ICD-10-CM

## 2010-12-17 DIAGNOSIS — J852 Abscess of lung without pneumonia: Secondary | ICD-10-CM

## 2010-12-17 NOTE — Progress Notes (Signed)
HPI the patient returns for followup after a right thoracotomy resection of right middle lobe and resection of right lower lobe abscess. Incision is well healed. We removed his chest tube sutures. His he is doing well overall. He is taken minimal pain medication. Chest x-ray shows a small right pleural effusion and postoperative changes. He was told to gradually increase his activities. Will see him back again in 3 weeks. We told him to stop his antibiotics this week.  Current Outpatient Prescriptions  Medication Sig Dispense Refill  . ANDROGEL PUMP 20.25 MG/ACT (1.62%) GEL Once daily.      . clindamycin (CLEOCIN) 150 MG capsule Take 300 mg by mouth 4 (four) times daily.        . famotidine (PEPCID) 20 MG tablet Take 20 mg by mouth 2 (two) times daily.        [name] guaiFENesin-dextromethorphan (ROBITUSSIN DM) 100-10 MG/5ML syrup Per bottle       . HYDROcodone-acetaminophen (VICODIN ES) 7.5-750 MG per tablet Take 1 tablet by mouth Three times daily as needed.      [name] omeprazole (PRILOSEC OTC) 20 MG tablet Take 20 mg by mouth daily.        . Probiotic Product (PROBIOTIC FORMULA PO) Take 1 capsule by mouth daily.        . promethazine-codeine (PHENERGAN WITH CODEINE) 6.25-10 MG/5ML syrup Take 5 mLs by mouth every 6 (six) hours as needed.  240 mL  0     Review of Systems: No change   Physical Exam  Cardiovascular: Normal rate, regular rhythm and normal heart sounds.   Pulmonary/Chest: Effort normal and breath sounds normal.     Diagnostic Tests: Chest x-ray shows small right pleural effusion with postoperative changes.   Impression: Status post resection of right lower lobe abscess and right middle lobe for bronchiectasis   Plan: Return in 3 weeks

## 2010-12-22 NOTE — Discharge Summary (Signed)
[name], [name] NO.:  [id number]  MEDICAL RECORD NO.:  [id number]  LOCATION:  3316                         FACILITY:  [facility]  PHYSICIAN:  Ines Bloomer, M.D. DATE OF BIRTH:  Apr 09, 1955  DATE OF ADMISSION:  12/05/2010 DATE OF DISCHARGE:  12/09/2010                              DISCHARGE SUMMARY   ADMITTING DIAGNOSES: 1. History of recurrent pneumonias. 2. Right lower lobe abscess. 3. History of hypertension. 4. Probable chronic obstructive pulmonary disease. 5. History of [facility] spotted fever. 6. History of anemia.  DISCHARGE DIAGNOSES: 1. History of recurrent pneumonias. 2. Right lower lobe abscess. 3. History of hypertension. 4. Probable chronic obstructive pulmonary disease. 5. History of [facility] spotted fever. 6. History of anemia.  PROCEDURE:  Right video-assisted thoracoscopic surgery, right mini thoracotomy, right middle lobe resection, wedge resection of the posterior segment of right lower lobe, and On-Q placement by Dr. Edwyna Shell on December 05, 2010.  HISTORY OF PRESENT ILLNESS:  This is a 55 year old male who has a history of recurrent pneumonias to his right middle and right lower lobes.  He was most recently found to have an abscess of the right lower lobe.  He underwent a bronchoscopy by Dr. Vassie Loll that showed purulent material from the right middle lobe.  He responded to antibiotics and was admitted to [facility] on December 05, 2010 to undergo right middle lobectomy and wedge resection of the right lower lobe.  BRIEF HOSPITAL COURSE STAY:  The patient remained afebrile and hemodynamically stable.  He was not found to have an air leak from his chest tube, suction was decreased on postop day #1.  His arterial line was removed on this day.  His Foley and anterior chest tube were removed on postoperative day #2.  Remaining chest tube was placed for Mini Express.  He was transferred from the Intensive Care Unit to 3300  for further convalescence on December 07, 2010.  Currently, his T-max is 99.5.  His O2 sat is 94% on 1 L oxygen via nasal cannula.  His heart rate and blood pressure are stable.  Remaining chest tube has approximately 150 mL out over the last 24 hours.  There is no air leak and it is going to be removed today.  PHYSICAL EXAMINATION:  CARDIOVASCULAR:  Regular rhythm. LUNGS:  Diminished at the right base. ABDOMNE:  Benign. EXTREMITIES:  No lower extremity edema. SKIN:  His right chest incision is clean, dry, and continuing to heal.  LABORATORY DATA:  His potassium was found to be 3.7 and is going to be supplemented accordingly today.  Provided he remains afebrile, hemodynamically stable, chest x-ray remained stable and pending morning round evaluation, he will surgically be stable for discharge on December 09, 2010.  Latest laboratory studies are as follows.  Right lower lung abscess final culture showed no organisms or no growth.  BMET done December 08, 2010, potassium 2.7 which has been supplemented, sodium 134, BUN and creatinine 13 and 0.62 respectively.  CBC on this date, H and H 11.9 and 35.5, white blood cell count down to 8200, and platelet count 174,000.  Last chest x-ray done on December 08, 2010 shows no pneumothorax, stable bibasilar  atelectasis.  DISCHARGE INSTRUCTIONS:  Diet:  Regular diet.  Activity:  The patient is to increase his activity slowly.  He may walk up steps.  He may shower.  He is not to lift more than 10 pounds for 2 weeks and not to drive until after 2 weeks.  He is to continue with his breathing exercise daily.  He is to walk daily and increase frequency and duration as he tolerates.  Wound care:  He is to use soap and water on his wounds.  He is to contact the office if any wound problems arise.  FOLLOWUP APPOINTMENTS:  To see Dr. Edwyna Shell on December 17, 2010 at 3:45 p.m. and 45 minutes prior to this office appointment a chest x-ray should be  obtained.  DISCHARGE MEDICATIONS: 1. Ultram 50 mg 1-2 tablets p.o. q.6 h. p.r.n. pain. 2. Cipro 500 mg p.o. 2 times daily. 3. Cleocin 300 mg p.o. q.6 h. 4. Famotidine 10 mg p.o. at bedtime. 5. Mucinex 1200 mg p.o. 2 times daily p.r.n. cough. 6. Omeprazole 20 mg 2 capsules p.o. q.a.m. before breakfast. 7. Probiotic 1 capsule p.o. daily. 8. Promethazine/codeine syrup 1 teaspoon p.o. at bedtime p.r.n. cough.     Doree Fudge, PA   ______________________________ Ines Bloomer, M.D.    DZ/MEDQ  D:  12/08/2010  T:  12/08/2010  Job:  [id number]  cc:   Oretha Milch, MD  Electronically Signed by Doree Fudge PA on 12/10/2010 08:54:10 AM Electronically Signed by Jovita Gamma M.D. on 12/22/2010 12:50:20 PM

## 2011-01-05 ENCOUNTER — Other Ambulatory Visit: Payer: Self-pay | Admitting: Thoracic Surgery

## 2011-01-05 DIAGNOSIS — J852 Abscess of lung without pneumonia: Secondary | ICD-10-CM

## 2011-01-07 ENCOUNTER — Encounter: Payer: Self-pay | Admitting: Thoracic Surgery

## 2011-01-07 ENCOUNTER — Ambulatory Visit
Admission: RE | Admit: 2011-01-07 | Discharge: 2011-01-07 | Disposition: A | Discharge status: Home / Self Care | Payer: 59 | Source: Ambulatory Visit | Attending: Thoracic Surgery | Admitting: Thoracic Surgery

## 2011-01-07 ENCOUNTER — Ambulatory Visit (INDEPENDENT_AMBULATORY_CARE_PROVIDER_SITE_OTHER): Payer: Self-pay | Admitting: Thoracic Surgery

## 2011-01-07 VITALS — BP 132/79 | HR 88 | Resp 18 | Ht 70.0 in | Wt 222.0 lb

## 2011-01-07 DIAGNOSIS — J852 Abscess of lung without pneumonia: Secondary | ICD-10-CM

## 2011-01-07 DIAGNOSIS — Z09 Encounter for follow-up examination after completed treatment for conditions other than malignant neoplasm: Secondary | ICD-10-CM

## 2011-01-07 NOTE — Progress Notes (Signed)
HPI patient returns for followup. His incisions are well-healed. He is doing well overall. Have released him to full activities. Chest x-ray really shows just a minimal effusion. We'll see him back again in 2 months for final check  Current Outpatient Prescriptions  Medication Sig Dispense Refill  . ANDROGEL PUMP 20.25 MG/ACT (1.62%) GEL Once daily.      Jennette Banker Sodium 30-100 MG CAPS Take 1 capsule by mouth 1 day or 1 dose.        [name] dextromethorphan-guaiFENesin (MUCINEX DM) 30-600 MG per 12 hr tablet Take 1 tablet by mouth every 12 (twelve) hours.        . famotidine (PEPCID) 20 MG tablet Take 20 mg by mouth 2 (two) times daily.        . hydrochlorothiazide (HYDRODIURIL) 25 MG tablet Take 25 mg by mouth daily.        [name] HYDROcodone-acetaminophen (VICODIN ES) 7.5-750 MG per tablet Take 1 tablet by mouth Three times daily as needed.      [name] omeprazole (PRILOSEC) 20 MG capsule Take 40 mg by mouth daily.        . promethazine-codeine (PHENERGAN WITH CODEINE) 6.25-10 MG/5ML syrup Take 5 mLs by mouth every 6 (six) hours as needed.  240 mL  0     Review of Systems: Recent URI   Physical Exam  Cardiovascular: Normal rate, regular rhythm, normal heart sounds and intact distal pulses.   Pulmonary/Chest: Effort normal and breath sounds normal. No respiratory distress.     Diagnostic Tests: Chest x-ray shows normal postoperative change with minimal effusion   Impression: Status post right lower lobe superior segment removal for a right lower lobe abscess resection of the bronchiectatic a right middle   Plan: Return in 2 months with chest x-ray.

## 2011-01-15 ENCOUNTER — Other Ambulatory Visit: Payer: Self-pay | Admitting: Dermatology

## 2011-03-10 ENCOUNTER — Other Ambulatory Visit: Payer: Self-pay | Admitting: Thoracic Surgery

## 2011-03-10 DIAGNOSIS — J852 Abscess of lung without pneumonia: Secondary | ICD-10-CM

## 2011-03-11 ENCOUNTER — Encounter: Payer: Self-pay | Admitting: Thoracic Surgery

## 2011-03-11 ENCOUNTER — Ambulatory Visit (INDEPENDENT_AMBULATORY_CARE_PROVIDER_SITE_OTHER): Payer: 59 | Admitting: Thoracic Surgery

## 2011-03-11 ENCOUNTER — Ambulatory Visit
Admission: RE | Admit: 2011-03-11 | Discharge: 2011-03-11 | Disposition: A | Discharge status: Home / Self Care | Payer: 59 | Source: Ambulatory Visit | Attending: Thoracic Surgery | Admitting: Thoracic Surgery

## 2011-03-11 VITALS — BP 136/78 | HR 100 | Resp 20 | Ht 70.0 in | Wt 232.0 lb

## 2011-03-11 DIAGNOSIS — Z9889 Other specified postprocedural states: Secondary | ICD-10-CM

## 2011-03-11 DIAGNOSIS — J852 Abscess of lung without pneumonia: Secondary | ICD-10-CM

## 2011-03-11 NOTE — Progress Notes (Signed)
HPI patient returns for followup. His chest x-ray shows normal postoperative changes. He is back to full activity. He has minimal chest pain. I have released him back to full activities. I will see him again as needed   Current Outpatient Prescriptions  Medication Sig Dispense Refill  . ANDROGEL PUMP 20.25 MG/ACT (1.62%) GEL Once daily.      [name] dextromethorphan-guaiFENesin (MUCINEX DM) 30-600 MG per 12 hr tablet Take 1 tablet by mouth every 12 (twelve) hours.        . famotidine (PEPCID) 20 MG tablet Take 20 mg by mouth 2 (two) times daily.        . hydrochlorothiazide (HYDRODIURIL) 25 MG tablet Take 25 mg by mouth daily.        [name] HYDROcodone-acetaminophen (VICODIN ES) 7.5-750 MG per tablet Take 1 tablet by mouth Three times daily as needed.      . meloxicam (MOBIC) 15 MG tablet Take 15 mg by mouth daily.        [name] omeprazole (PRILOSEC) 20 MG capsule Take 40 mg by mouth daily.        . promethazine-codeine (PHENERGAN WITH CODEINE) 6.25-10 MG/5ML syrup Take 5 mLs by mouth every 6 (six) hours as needed.  240 mL  0     Review of Systems: No change   Physical Exam lungs were clear to auscultation percussion   Diagnostic Tests: Chest x-ray showed normal postoperative changes   Impression: Status post right middle lobectomy for lung abscess   Plan: Followup as needed

## 2011-08-13 ENCOUNTER — Ambulatory Visit (INDEPENDENT_AMBULATORY_CARE_PROVIDER_SITE_OTHER)
Admission: RE | Admit: 2011-08-13 | Discharge: 2011-08-13 | Disposition: A | Discharge status: Home / Self Care | Payer: 59 | Source: Ambulatory Visit | Attending: Internal Medicine | Admitting: Internal Medicine

## 2011-08-13 ENCOUNTER — Ambulatory Visit (INDEPENDENT_AMBULATORY_CARE_PROVIDER_SITE_OTHER): Payer: 59 | Admitting: Internal Medicine

## 2011-08-13 ENCOUNTER — Encounter: Payer: Self-pay | Admitting: Internal Medicine

## 2011-08-13 DIAGNOSIS — J479 Bronchiectasis, uncomplicated: Secondary | ICD-10-CM

## 2011-08-13 MED ORDER — PREDNISONE (PAK) 10 MG PO TABS: ORAL_TABLET | ORAL | Status: AC

## 2011-08-13 MED ORDER — AMOXICILLIN-POT CLAVULANATE 875-125 MG PO TABS: 1.0000 | ORAL_TABLET | Freq: Two times a day (BID) | ORAL | Status: AC

## 2011-08-13 NOTE — Patient Instructions (Addendum)
Bronchiectasis =   you have scarring of your bronchial tubes which means that they don't function perfectly normally and mucus tends to pool in certain areas of your lung which can cause pneumonia and further scarring of your lung and bronchial tubes  Whenever you develop cough congestion take mucinex or mucinex dm > these will help keep the mucus loose and flowing but if your condition worsens you need to seek help immediately preferably here or somewhere inside the [facility] system to compare xrays ( worse = darker or bloody mucus or pain on breathing in)   symbicort 80 Take 2 puffs first thing in am and then another 2 puffs about 12 hours later.    Augmentin x 10 days take bfast and supper and eat yogurt at lunch  Prednisone 10 mg take  4 each am x 2 days,   2 each am x 2 days,  1 each am x2days and stop   Please schedule a follow up office visit in 4 weeks, sooner if needed with PFT's on return

## 2011-08-13 NOTE — Assessment & Plan Note (Signed)
CT 07/10/2004 Marked bronchiectatic change in the anterior right lower lobe and to a much lesser degree in the right middle lobe.      - Alpha one AT genotype sent 11/11/2010 > MM     -  HFA 75% 08/13/2011 > rx symbicort 80 2bid  DDX of  difficult airways managment all start with A and  include Adherence, Ace Inhibitors, Acid Reflux, Active Sinus Disease, Alpha 1 Antitripsin deficiency, Anxiety masquerading as Airways dz,  ABPA,  allergy(esp in young), Aspiration (esp in elderly), Adverse effects of DPI,  Active smokers, plus two Bs  = Bronchiectasis and Beta blocker use..and one C= CHF  He has documented bronchiectasis but clearly has gerd also as coughs so hard he vomits and may have Active sinusitis  Adherence is always the initial "prime suspect" and is a multilayered concern that requires a "trust but verify" approach in every patient - starting with knowing how to use medications, especially inhalers, correctly, keeping up with refills and understanding the fundamental difference between maintenance and prns vs those medications only taken for a very short course and then stopped and not refilled. The proper method of use, as well as anticipated side effects, of a metered-dose inhaler are discussed and demonstrated to the patient. Improved effectiveness after extensive coaching during this visit to a level of approximately  75% so try symbicort 80 2bid pending along with short course of augmentin and prednisone  See instructions for specific recommendations which were reviewed directly with the patient who was given a copy with highlighter outlining the key components.

## 2011-08-13 NOTE — Progress Notes (Signed)
Subjective:    Patient ID: [name], male    DOB: 1955-12-30, 56 y.o.   MRN: [id number]  HPI 56 yo male never smoker  with seen for initial pulmonary consult 10/19/10 for RML, RLL PNA w/ abscess in setting of longstanding bronchiectasis.  11/06/10 Post Hospital  Pt presents for post hospital follow up . He was admitted 10/19/2010 - 10/29/2010  For a  Right middle lobe, right lower lobe pneumonia with abscess, status post bronchoscopy. ,  Right middle lobe hypoplasia, by history.  Questionable severe chronic obstructive pulmonary disease, status post PFTs. Will need repeat once stablized .   Periapical tooth abscess.  CT scan was performed, which did show an abscess in the right lung.  Was seen by  Cardiothoracic Surgery, also evaluated the patient and felt that he is likely to need resection in the future. He did undergo bronchoscopy as well as PFT which showed severe COPD.-(he is a never smoker)    He was noted to have periapical tooth abscess. He was seen by Dr. Kristin [name] here, but it was not felt this was likely contributory to his lung abscess. Dr. Kristin [name] will again help coordinate outpatient followup He underwent  Bronchoscopy, October 27, 2010, which shows thick purulent sputum  emanating from the right middle lobe. Neg cytology for malignancy.  PFTs showed FEV1 of 1.78 and FVC of 2.50.[name] CT chest, August 1 1: Right lower lobe consolidation with cystic and bronchiectasis changes with a 5.2 x 8 cm gas fluid collection compatible with pneumonia associated with infected bulla/abscess. [name], August 3: Question periapical abscess associated with the left lower second most posterior tooth. . Chest x-ray, August 8: Improving aeration of the right lung base. Persistent cavitary right lower lobe lesion with air-fluid level consistent with pulmonary abscess. Unchanged small right pleural effusion.   Since discharge reports breathing has improved but still having prod cough with yellow-dark brown  mucus that causes right chest discomfort.  requesting refill on promethazine-codeine syrup. He was discharged on Clindamycin and Cipro with plans for prolonged abx.  Still feels weak and run down.  rec Continue on antibiotics as planned. Yogurt daily  Prilosec 20mg  daily before meals.  GERD diet.    11/11/2010 f/u ov/[name] cc minimally improved with severe cough to point of vomiting but fever appears to have abated and no diarrhea or abd pain on clindamycin.  R side chest discomfort during severe cough only. No resting sob rec GERD diet Increase Try prilosec 20mg   Take two  30-60 min before first meal of the day and Pepcid 20 mg one bedtime until cough is completely gone for at least a week without the need for cough suppression Continue mucinex dm 1200mg  twice daily = total 2400  Mg in 24 hours then supplement with the codeine syrup. We will send your blood for alpha one antitrypsin > MM  08/13/2011 f/u ov/[name] cc pt c/o prod cough x 23mo, mucus-yellow in color, pt states coughing leads to vomiting spells. Pt also c/o wheezing and fatigue. Pt has tx with Mucinex DM and ProAir-2 puff prn. In terms of sob > knees stop before sob, saba helps. Cough is worse afternoon, better p saba.  Sleeping poorly with feq  nocturnal  or early am exacerbation  of respiratory  c/o's and need for noct saba. Also denies any obvious fluctuation of symptoms with weather or environmental changes or other aggravating or alleviating factors except as outlined above    Pt denies any significant sore throat,  dysphagia, itching, sneezing,  nasal congestion or excess/ purulent secretions,  shaking chills, sweats, unintended wt loss,  exertional cp, hempoptysis, orthopnea pnd or leg swelling.    Also denies any obvious fluctuation of symptoms with weather or environmental changes or other aggravating or alleviating factors.              Objective:   Physical Exam  GEN: A/Ox3; pleasant , NAD  Wt 219 > 222 11/11/2010   > 08/13/2011  208 HEENT:  [name]/AT,  EACs-clear, TMs-wnl, NOSE-clear, THROAT-clear, no lesions, no postnasal drip or exudate noted.  Very poor dental care, multiple caries and broken teeth   NECK:  Supple w/ fair ROM; no JVD; normal carotid impulses w/o bruits; no thyromegaly or nodules palpated; no lymphadenopathy.  RESP  Coarse BS w/ few rhonchi, dimished BS in bases .no accessory muscle use, no dullness to percussion  CARD:  RRR, no m/r/g  , no peripheral edema, pulses intact, no cyanosis or clubbing.  GI:   Soft & nt; nml bowel sounds; no organomegaly or masses detected.  Musco: Warm bil, no deformities or joint swelling noted.   Neuro: alert, no focal deficits noted.    Skin: Warm, no lesions or rashes   CXR  08/13/2011 : Stable hyperinflation and mild bronchitic changes. Stable scarring right base medially. There is hazy airspace disease left base suspicious for early infiltrate/pneumonia.             Assessment & Plan:

## 2011-08-14 ENCOUNTER — Telehealth: Payer: Self-pay | Admitting: Internal Medicine

## 2011-08-14 DIAGNOSIS — J851 Abscess of lung with pneumonia: Secondary | ICD-10-CM

## 2011-08-14 MED ORDER — PROMETHAZINE-CODEINE 6.25-10 MG/5ML PO SYRP: 5.0000 mL | ORAL_SOLUTION | Freq: Four times a day (QID) | ORAL | Status: DC | PRN

## 2011-08-14 NOTE — Telephone Encounter (Signed)
Ok to refil x 8 oz

## 2011-08-14 NOTE — Progress Notes (Signed)
Quick Note:  Spoke with pt and notified of results per Dr. Wert. Pt verbalized understanding and denied any questions.  ______ 

## 2011-08-14 NOTE — Telephone Encounter (Signed)
Dr Sherene Sires, do you feel that the pt is contagious and should stay away from his daughter who is pregnant? Please advise, thnks

## 2011-08-14 NOTE — Telephone Encounter (Signed)
Called and spoke with the pt and he is aware of rx called to the pharmacy for the promethazine with codeine.

## 2011-08-14 NOTE — Telephone Encounter (Signed)
Called and spoke with pt and he is aware of MW recs.   Pt voiced his understanding of this.  Pt is now requesting refill of the promethazine with codeine to help with his cough.  Please advise if ok to send in for refill.

## 2011-08-14 NOTE — Telephone Encounter (Signed)
This type of condition is not infectious so ok to be around pregnant women

## 2011-09-02 ENCOUNTER — Other Ambulatory Visit: Payer: Self-pay | Admitting: Internal Medicine

## 2011-09-02 DIAGNOSIS — J189 Pneumonia, unspecified organism: Secondary | ICD-10-CM

## 2011-09-03 ENCOUNTER — Encounter: Payer: Self-pay | Admitting: Internal Medicine

## 2011-09-03 ENCOUNTER — Ambulatory Visit (INDEPENDENT_AMBULATORY_CARE_PROVIDER_SITE_OTHER)
Admission: RE | Admit: 2011-09-03 | Discharge: 2011-09-03 | Disposition: A | Discharge status: Home / Self Care | Payer: 59 | Source: Ambulatory Visit | Attending: Internal Medicine | Admitting: Internal Medicine

## 2011-09-03 ENCOUNTER — Ambulatory Visit (INDEPENDENT_AMBULATORY_CARE_PROVIDER_SITE_OTHER): Payer: 59 | Admitting: Internal Medicine

## 2011-09-03 VITALS — BP 114/70 | HR 78 | Temp 98.6°F | Ht 70.0 in | Wt 202.8 lb

## 2011-09-03 DIAGNOSIS — J189 Pneumonia, unspecified organism: Secondary | ICD-10-CM

## 2011-09-03 DIAGNOSIS — J852 Abscess of lung without pneumonia: Secondary | ICD-10-CM

## 2011-09-03 DIAGNOSIS — J479 Bronchiectasis, uncomplicated: Secondary | ICD-10-CM

## 2011-09-03 MED ORDER — AMOXICILLIN-POT CLAVULANATE 875-125 MG PO TABS: 1.0000 | ORAL_TABLET | Freq: Two times a day (BID) | ORAL | Status: AC

## 2011-09-03 MED ORDER — MOMETASONE FURO-FORMOTEROL FUM 100-5 MCG/ACT IN AERO: 2.0000 | INHALATION_SPRAY | Freq: Two times a day (BID) | RESPIRATORY_TRACT | Status: DC

## 2011-09-03 NOTE — Progress Notes (Signed)
Subjective:    Patient ID: [name], male    DOB: Jun 06, 1955, 56 y.o.   MRN: [id number]  HPI 56 yo male never smoker  with seen for initial pulmonary consult 10/19/10 for RML, RLL PNA w/ abscess in setting of longstanding bronchiectasis.  11/06/10 Post Hospital  Pt presents for post hospital follow up . He was admitted 10/19/2010 - 10/29/2010  For a  Right middle lobe, right lower lobe pneumonia with abscess, status post bronchoscopy. ,  Right middle lobe hypoplasia, by history.  Questionable severe chronic obstructive pulmonary disease, status post PFTs. Will need repeat once stablized .   Periapical tooth abscess.  CT scan was performed, which did show an abscess in the right lung.  Was seen by  Cardiothoracic Surgery, also evaluated the patient and felt that he is likely to need resection in the future. He did undergo bronchoscopy as well as PFT which showed severe COPD.-(he is a never smoker)    He was noted to have periapical tooth abscess. He was seen by Dr. Kristin Bruins here, but it was not felt this was likely contributory to his lung abscess. Dr. Kristin Bruins will again help coordinate outpatient followup He underwent  Bronchoscopy, October 27, 2010, which shows thick purulent sputum  emanating from the right middle lobe. Neg cytology for malignancy.  PFTs showed FEV1 of 1.78 and FVC of 2.50.[name] CT chest, August 1 1: Right lower lobe consolidation with cystic and bronchiectasis changes with a 5.2 x 8 cm gas fluid collection compatible with pneumonia associated with infected bulla/abscess. [name], August 3: Question periapical abscess associated with the left lower second most posterior tooth. . Chest x-ray, August 8: Improving aeration of the right lung base. Persistent cavitary right lower lobe lesion with air-fluid level consistent with pulmonary abscess. Unchanged small right pleural effusion.   Since discharge reports breathing has improved but still having prod cough with yellow-dark brown  mucus that causes right chest discomfort.  requesting refill on promethazine-codeine syrup. He was discharged on Clindamycin and Cipro with plans for prolonged abx.  Still feels weak and run down.  rec Continue on antibiotics as planned. Yogurt daily  Prilosec 20mg  daily before meals.  GERD diet.    11/11/2010 f/u ov/[name] cc minimally improved with severe cough to point of vomiting but fever appears to have abated and no diarrhea or abd pain on clindamycin.  R side chest discomfort during severe cough only. No resting sob rec GERD diet Increase Try prilosec 20mg   Take two  30-60 min before first meal of the day and Pepcid 20 mg one bedtime until cough is completely gone for at least a week without the need for cough suppression Continue mucinex dm 1200mg  twice daily = total 2400  Mg in 24 hours then supplement with the codeine syrup. We will send your blood for alpha one antitrypsin > MM  9/142012 right thoracotomy resection of right middle lobe and resection of right lower lobe abscess   08/13/2011 f/u ov/[name] cc pt c/o prod cough x 3 mo, mucus-yellow in color, pt states coughing leads to vomiting spells. Pt also c/o wheezing and fatigue. Pt has tx with Mucinex DM and ProAir-2 puff prn. In terms of sob > knees stop before sob, saba helps. Cough is worse afternoon, better p saba.  Sleeping poorly with feq  nocturnal  or early am exacerbation  of respiratory  c/o's and need for noct saba. Also denies any obvious fluctuation of symptoms with weather or environmental changes or  other aggravating or alleviating factors except as outlined above  rec Symbicort 80 Take 2 puffs first thing in am and then another 2 puffs about 12 hours later.  Augmentin x 10 days take bfast and supper and eat yogurt at lunch Prednisone 10 mg take  4 each am x 2 days,   2 each am x 2 days,  1 each am x 2days and stop  PFT's on return   09/03/2011 f/u ov/[name] cc cough still comes and goes, no longer vomiting,  intermittently brown sputum, less sob not limited by breathing. No real pattern to variability of cough but better since rx with symbicort,  Sleeping ok without nocturnal  or early am exacerbation  of respiratory  c/o's or need for noct saba. Also denies any obvious fluctuation of symptoms with weather or environmental changes or other aggravating or alleviating factors except as outlined above    ROS  At present neg for  any significant sore throat, dysphagia, dental problems, itching, sneezing,  nasal congestion or excess/ purulent secretions, ear ache,   fever, chills, sweats, unintended wt loss, pleuritic or exertional cp, hemoptysis, palpitations, orthopnea pnd or leg swelling.  Also denies presyncope, palpitations, heartburn, abdominal pain, anorexia, nausea, vomiting, diarrhea  or change in bowel or urinary habits, change in stools or urine, dysuria,hematuria,  rash, arthralgias, visual complaints, headache, numbness weakness or ataxia or problems with walking or coordination. No noted change in mood/affect or memory.                  Objective:   Physical Exam  GEN: A/Ox3; pleasant , NAD  Wt 219 > 222 11/11/2010  > 08/13/2011  208 > 09/03/2011  202 HEENT:  [place]/AT,  EACs-clear, TMs-wnl, NOSE-clear, THROAT-clear, no lesions, no postnasal drip or exudate noted.  Very poor dental care, multiple caries and broken teeth   NECK:  Supple w/ fair ROM; no JVD; normal carotid impulses w/o bruits; no thyromegaly or nodules palpated; no lymphadenopathy.  RESP  Coarse BS w/ few rhonchi, dimished BS in bases .no accessory muscle use, no dullness to percussion  CARD:  RRR, no m/r/g  , no peripheral edema, pulses intact, no cyanosis or clubbing.  GI:   Soft & nt; nml bowel sounds; no organomegaly or masses detected.  Musco: Warm bil, no deformities or joint swelling noted.   Neuro: alert, no focal deficits noted.    Skin: Warm, no lesions or rashes     CXR  09/03/2011 : Improved airspace  opacity at the cardiac apex/anterior left lower lobe. Residual opacity is present, likely representing post infectious/inflammatory change.Stable right pleural fluid and / or scarring and parenchymal scarring at the right cardiophrenic angle.         Assessment & Plan:

## 2011-09-03 NOTE — Assessment & Plan Note (Signed)
-   CT 07/10/2004 Marked bronchiectatic change in the anterior right lower lobe and to a much lesser degree in the right middle lobe.      - Alpha one AT genotype sent 11/11/2010 > MM     -  HFA 90% 09/03/2011   Generally better p rx with augmentin and rx with laba/ics so will continue  The proper method of use, as well as anticipated side effects, of a metered-dose inhaler are discussed and demonstrated to the patient. Improved effectiveness after extensive coaching during this visit to a level of approximately  90%

## 2011-09-03 NOTE — Assessment & Plan Note (Signed)
See CT Chest 10/22/10 12/05/2010 right thoracotomy resection of right middle lobe and resection of right lower lobe abscess  He is at risk of recurrent bronchiectasis/ abscess so ok to rx with prn augmentin cycles

## 2011-09-03 NOTE — Patient Instructions (Addendum)
When symbicort runs out change to dulera 100 Take 2 puffs first thing in am and then another 2 puffs about 12 hours later.    mucinex or mucinex dm best choice for cough  If mucus gets real nasty or you feel like you typically do with pneumonia ok to go ahead and use the augmentin x 10 days  We'll see you after your pfts to review them and your progress on this combination of meds

## 2011-09-15 ENCOUNTER — Encounter (INDEPENDENT_AMBULATORY_CARE_PROVIDER_SITE_OTHER): Payer: 59

## 2011-09-15 DIAGNOSIS — J479 Bronchiectasis, uncomplicated: Secondary | ICD-10-CM

## 2011-09-18 ENCOUNTER — Ambulatory Visit (INDEPENDENT_AMBULATORY_CARE_PROVIDER_SITE_OTHER): Payer: 59 | Admitting: Internal Medicine

## 2011-09-18 DIAGNOSIS — J852 Abscess of lung without pneumonia: Secondary | ICD-10-CM

## 2011-09-18 DIAGNOSIS — J479 Bronchiectasis, uncomplicated: Secondary | ICD-10-CM

## 2011-09-18 LAB — PULMONARY FUNCTION TEST

## 2011-09-18 NOTE — Progress Notes (Signed)
PFT done today. 

## 2011-09-21 ENCOUNTER — Ambulatory Visit (INDEPENDENT_AMBULATORY_CARE_PROVIDER_SITE_OTHER): Payer: 59 | Admitting: Internal Medicine

## 2011-09-21 ENCOUNTER — Other Ambulatory Visit: Payer: 59

## 2011-09-21 ENCOUNTER — Encounter: Payer: Self-pay | Admitting: Internal Medicine

## 2011-09-21 VITALS — BP 110/72 | HR 75 | Temp 98.1°F | Ht 71.0 in | Wt 202.0 lb

## 2011-09-21 DIAGNOSIS — J479 Bronchiectasis, uncomplicated: Secondary | ICD-10-CM

## 2011-09-21 NOTE — Patient Instructions (Addendum)
Maximum dose on mucinex dm is 1200mg  every 12 hours  Please remember to go to the lab  department downstairs for your immune work up- we will call you with the results when they are available.     Please schedule a follow up visit in 3 months but call sooner if needed with cxr then.

## 2011-09-21 NOTE — Assessment & Plan Note (Addendum)
-   CT 07/10/2004 Marked bronchiectatic change in the anterior right lower lobe and to a much lesser degree in the right middle lobe.      - Alpha one AT genotype sent 11/11/2010 > MM     -  HFA 90% 09/03/2011      -  PFT's 09/21/2011 FEV1  2.79 (83%) ratio 64 and no better p B2, DLCO 122%     -  Quant Ig 09/21/2011 >>>  He does have mild airflow obstruction and clinically improved on ics/laba so will ask him to continue rx but change to dulera as per insurance recs and stay away from advair dpi due to prominence of cough.  Also since coughs so hard gags need to continue rx for gerd with ppi qam and h2 at hs.

## 2011-09-21 NOTE — Progress Notes (Signed)
Subjective:    Patient ID: [name], male    DOB: 1955-10-22   MRN: [id number]  HPI 50 yowm   never smoker  with seen for initial pulmonary consult 10/19/10 for RML, RLL PNA w/ abscess in setting of longstanding bronchiectasis.  11/06/10 Post Hospital  Pt presents for post hospital follow up . He was admitted 10/19/2010 - 10/29/2010  For a  Right middle lobe, right lower lobe pneumonia with abscess, status post bronchoscopy. ,  Right middle lobe hypoplasia, by history.  Questionable severe chronic obstructive pulmonary disease, status post PFTs. Will need repeat once stablized .   Periapical tooth abscess.  CT scan was performed, which did show an abscess in the right lung.  Was seen by  Cardiothoracic Surgery, also evaluated the patient and felt that he is likely to need resection in the future. He did undergo bronchoscopy as well as PFT which showed severe COPD.-(he is a never smoker)    He was noted to have periapical tooth abscess. He was seen by Dr. Kristin Bruins here, but it was not felt this was likely contributory to his lung abscess. Dr. Kristin Bruins will again help coordinate outpatient followup He underwent  Bronchoscopy, October 27, 2010, which shows thick purulent sputum  emanating from the right middle lobe. Neg cytology for malignancy.  PFTs showed FEV1 of 1.78 and FVC of 2.50.[name] CT chest, August 1 1: Right lower lobe consolidation with cystic and bronchiectasis changes with a 5.2 x 8 cm gas fluid collection compatible with pneumonia associated with infected bulla/abscess. [name], August 3: Question periapical abscess associated with the left lower second most posterior tooth. . Chest x-ray, August 8: Improving aeration of the right lung base. Persistent cavitary right lower lobe lesion with air-fluid level consistent with pulmonary abscess. Unchanged small right pleural effusion.   Since discharge reports breathing has improved but still having prod cough with yellow-dark brown mucus  that causes right chest discomfort.  requesting refill on promethazine-codeine syrup. He was discharged on Clindamycin and Cipro with plans for prolonged abx.  Still feels weak and run down.  rec Continue on antibiotics as planned. Yogurt daily  Prilosec 20mg  daily before meals.  GERD diet.    11/11/2010 f/u ov/[name] cc minimally improved with severe cough to point of vomiting but fever appears to have abated and no diarrhea or abd pain on clindamycin.  R side chest discomfort during severe cough only. No resting sob rec GERD diet Increase Try prilosec 20mg   Take two  30-60 min before first meal of the day and Pepcid 20 mg one bedtime until cough is completely gone for at least a week without the need for cough suppression Continue mucinex dm 1200mg  twice daily = total 2400  Mg in 24 hours then supplement with the codeine syrup. We will send your blood for alpha one antitrypsin > MM  12/05/2010 right thoracotomy resection of right middle lobe and resection of right lower lobe abscess   08/13/2011 f/u ov/[name] cc pt c/o prod cough x 3 mo, mucus-yellow in color, pt states coughing leads to vomiting spells. Pt also c/o wheezing and fatigue. Pt has tx with Mucinex DM and ProAir-2 puff prn. In terms of sob > knees stop before sob, saba helps. Cough is worse afternoon, better p saba.  Sleeping poorly with feq  nocturnal  or early am exacerbation  of respiratory  c/o's and need for noct saba. Also denies any obvious fluctuation of symptoms with weather or environmental changes or other  aggravating or alleviating factors except as outlined above  rec Symbicort 80 Take 2 puffs first thing in am and then another 2 puffs about 12 hours later.  Augmentin x 10 days take bfast and supper and eat yogurt at lunch Prednisone 10 mg take  4 each am x 2 days,   2 each am x 2 days,  1 each am x 2days and stop  PFT's on return   09/03/2011 f/u ov/[name] cc cough still comes and goes, no longer vomiting, intermittently  brown sputum, less sob not limited by breathing. No real pattern to variability of cough but better since rx with symbicort.  rec When symbicort runs out change to dulera 100 Take 2 puffs first thing in am and then another 2 puffs about 12 hours later.  mucinex or mucinex dm best choice for cough If mucus gets real nasty or you feel like you typically do with pneumonia ok to go ahead and use the augmentin x 10 days.   09/21/2011 f/u ov/[name] overall improved since on maint laba/ics cc persistent severe cough mostly daytime not usually waking him up unless flare and   No   purulent sputum or sinus/hb symptoms on present rx.   Sleeping ok without nocturnal  or early am exacerbation  of respiratory  c/o's or need for noct saba. Also denies any obvious fluctuation of symptoms with weather or environmental changes or other aggravating or alleviating factors except as outlined above    ROS  At present neg for  any significant sore throat, dysphagia, dental problems, itching, sneezing,  nasal congestion or excess/ purulent secretions, ear ache,   fever, chills, sweats, unintended wt loss, pleuritic or exertional cp, hemoptysis, palpitations, orthopnea pnd or leg swelling.  Also denies presyncope, palpitations, heartburn, abdominal pain, anorexia, nausea, vomiting, diarrhea  or change in bowel or urinary habits, change in stools or urine, dysuria,hematuria,  rash, arthralgias, visual complaints, headache, numbness weakness or ataxia or problems with walking or coordination. No noted change in mood/affect or memory.                  Objective:   Physical Exam  GEN: A/Ox3; pleasant , NAD  Wt 219 > 222 11/11/2010  > 08/13/2011  208 > 09/03/2011  202> 202 09/21/2011   HEENT:  [place]/AT,  EACs-clear, TMs-wnl, NOSE-clear, THROAT-clear, no lesions, no postnasal drip or exudate noted.     NECK:  Supple w/ fair ROM; no JVD; normal carotid impulses w/o bruits; no thyromegaly or nodules palpated; no  lymphadenopathy.  RESP  Distant  BS w/ few rhonchi, dimished BS in bases .no accessory muscle use, no dullness to percussion  CARD:  RRR, no m/r/g  , no peripheral edema, pulses intact, no cyanosis or clubbing.  GI:   Soft & nt; nml bowel sounds; no organomegaly or masses detected.  Musco: Warm bil, no deformities or joint swelling noted.        CXR  09/03/2011 : Improved airspace opacity at the cardiac apex/anterior left lower lobe. Residual opacity is present, likely representing post infectious/inflammatory change.Stable right pleural fluid and / or scarring and parenchymal scarring at the right cardiophrenic angle.         Assessment & Plan:

## 2011-09-22 ENCOUNTER — Encounter: Payer: Self-pay | Admitting: Internal Medicine

## 2011-09-22 LAB — IGG, IGA, IGM
IgA: 283 mg/dL (ref 68–379)
IgG (Immunoglobin G), Serum: 1270 mg/dL (ref 650–1600)
IgM, Serum: 52 mg/dL (ref 41–251)

## 2011-09-22 NOTE — Progress Notes (Signed)
Quick Note:  Spoke with pt and notified of results per Dr. Wert. Pt verbalized understanding and denied any questions.  ______ 

## 2011-09-23 LAB — ALLERGY PROFILE REGION II-DC, DE, MD, ~~LOC~~, VA
Allergen, D pternoyssinus,d7: 0.25 kU/L (ref <0.35)
Alternaria Alternata: 0.12 kU/L (ref <0.35)
Aspergillus fumigatus, IgG: 0.11 kU/L (ref <0.35)
Bermuda Grass: 1.96 kU/L — ABNORMAL HIGH (ref <0.35)
Box Elder IgE: 2.04 kU/L — ABNORMAL HIGH (ref <0.35)
Cat Dander: <0.1 kU/L (ref <0.35)
Cladosporium Herbarum: <0.1 kU/L (ref <0.35)
Cockroach: 1.45 kU/L — ABNORMAL HIGH (ref <0.35)
Common Ragweed: 2.11 kU/L — ABNORMAL HIGH (ref <0.35)
D. farinae: 2.26 kU/L — ABNORMAL HIGH (ref <0.35)
Dog Dander: 0.33 kU/L (ref <0.35)
Elm IgE: 1.96 kU/L — ABNORMAL HIGH (ref <0.35)
IgE (Immunoglobulin E), Serum: 12.5 IU/mL (ref 0.0–180.0)
Johnson Grass: 1.8 kU/L — ABNORMAL HIGH (ref <0.35)
Lamb's Quarters: 1.83 kU/L — ABNORMAL HIGH (ref <0.35)
Meadow Grass: 1.97 kU/L — ABNORMAL HIGH (ref <0.35)
Oak: 1.88 kU/L — ABNORMAL HIGH (ref <0.35)
Pecan/Hickory Tree IgE: 1.68 kU/L — ABNORMAL HIGH (ref <0.35)

## 2011-09-23 NOTE — Progress Notes (Signed)
Quick Note:  Spoke with pt and notified of results per Dr. Wert. Pt verbalized understanding and denied any questions.  ______ 

## 2011-10-19 ENCOUNTER — Encounter: Payer: Self-pay | Admitting: Internal Medicine

## 2011-11-16 ENCOUNTER — Telehealth: Payer: Self-pay | Admitting: Internal Medicine

## 2011-11-16 MED ORDER — AMOXICILLIN-POT CLAVULANATE 875-125 MG PO TABS: 1.0000 | ORAL_TABLET | Freq: Two times a day (BID) | ORAL | Status: AC

## 2011-11-16 NOTE — Telephone Encounter (Signed)
Pt aware. rx sent.  Durward Matranga, CMA  

## 2011-11-16 NOTE — Telephone Encounter (Signed)
Ok to call in

## 2011-11-16 NOTE — Telephone Encounter (Signed)
PT is out or town and started with cough and chest congestion yesterday.  Please advise if ok to send rx for Augmentin x 10 days.

## 2011-12-17 ENCOUNTER — Other Ambulatory Visit: Payer: Self-pay | Admitting: Gastroenterology

## 2011-12-23 ENCOUNTER — Ambulatory Visit: Payer: Self-pay | Admitting: Internal Medicine

## 2011-12-29 ENCOUNTER — Telehealth: Payer: Self-pay | Admitting: Internal Medicine

## 2011-12-29 NOTE — Telephone Encounter (Signed)
I spoke with pt and he wanted to know if we had the 3 string flu vaccine or the 4 string vaccine. I advised we had the 3 string. He stated he is going to call around and see who has the 4 string since [facility] does not. He needed nothing further.

## 2012-01-07 ENCOUNTER — Ambulatory Visit (INDEPENDENT_AMBULATORY_CARE_PROVIDER_SITE_OTHER): Payer: 59 | Admitting: Internal Medicine

## 2012-01-07 ENCOUNTER — Encounter: Payer: Self-pay | Admitting: Internal Medicine

## 2012-01-07 ENCOUNTER — Ambulatory Visit (INDEPENDENT_AMBULATORY_CARE_PROVIDER_SITE_OTHER)
Admission: RE | Admit: 2012-01-07 | Discharge: 2012-01-07 | Disposition: A | Discharge status: Home / Self Care | Payer: 59 | Source: Ambulatory Visit | Attending: Internal Medicine | Admitting: Internal Medicine

## 2012-01-07 VITALS — BP 104/64 | HR 86 | Temp 98.3°F | Ht 70.0 in | Wt 189.0 lb

## 2012-01-07 DIAGNOSIS — J479 Bronchiectasis, uncomplicated: Secondary | ICD-10-CM

## 2012-01-07 DIAGNOSIS — Z23 Encounter for immunization: Secondary | ICD-10-CM

## 2012-01-07 MED ORDER — PREDNISONE (PAK) 10 MG PO TABS: ORAL_TABLET | ORAL | Status: DC

## 2012-01-07 MED ORDER — MOMETASONE FURO-FORMOTEROL FUM 200-5 MCG/ACT IN AERO: INHALATION_SPRAY | RESPIRATORY_TRACT | Status: DC

## 2012-01-07 NOTE — Progress Notes (Signed)
Subjective:    Patient ID: [name], male    DOB: 1955-10-22   MRN: [id number]  HPI 50 yowm   never smoker  with seen for initial pulmonary consult 10/19/10 for RML, RLL PNA w/ abscess in setting of longstanding bronchiectasis.  11/06/10 Post Hospital  Pt presents for post hospital follow up . He was admitted 10/19/2010 - 10/29/2010  For a  Right middle lobe, right lower lobe pneumonia with abscess, status post bronchoscopy. ,  Right middle lobe hypoplasia, by history.  Questionable severe chronic obstructive pulmonary disease, status post PFTs. Will need repeat once stablized .   Periapical tooth abscess.  CT scan was performed, which did show an abscess in the right lung.  Was seen by  Cardiothoracic Surgery, also evaluated the patient and felt that he is likely to need resection in the future. He did undergo bronchoscopy as well as PFT which showed severe COPD.-(he is a never smoker)    He was noted to have periapical tooth abscess. He was seen by Dr. Kristin Bruins here, but it was not felt this was likely contributory to his lung abscess. Dr. Kristin Bruins will again help coordinate outpatient followup He underwent  Bronchoscopy, October 27, 2010, which shows thick purulent sputum  emanating from the right middle lobe. Neg cytology for malignancy.  PFTs showed FEV1 of 1.78 and FVC of 2.50.[name] CT chest, August 1 1: Right lower lobe consolidation with cystic and bronchiectasis changes with a 5.2 x 8 cm gas fluid collection compatible with pneumonia associated with infected bulla/abscess. [name], August 3: Question periapical abscess associated with the left lower second most posterior tooth. . Chest x-ray, August 8: Improving aeration of the right lung base. Persistent cavitary right lower lobe lesion with air-fluid level consistent with pulmonary abscess. Unchanged small right pleural effusion.   Since discharge reports breathing has improved but still having prod cough with yellow-dark brown mucus  that causes right chest discomfort.  requesting refill on promethazine-codeine syrup. He was discharged on Clindamycin and Cipro with plans for prolonged abx.  Still feels weak and run down.  rec Continue on antibiotics as planned. Yogurt daily  Prilosec 20mg  daily before meals.  GERD diet.    11/11/2010 f/u ov/Wert cc minimally improved with severe cough to point of vomiting but fever appears to have abated and no diarrhea or abd pain on clindamycin.  R side chest discomfort during severe cough only. No resting sob rec GERD diet Increase Try prilosec 20mg   Take two  30-60 min before first meal of the day and Pepcid 20 mg one bedtime until cough is completely gone for at least a week without the need for cough suppression Continue mucinex dm 1200mg  twice daily = total 2400  Mg in 24 hours then supplement with the codeine syrup. We will send your blood for alpha one antitrypsin > MM  12/05/2010 right thoracotomy resection of right middle lobe and resection of right lower lobe abscess   08/13/2011 f/u ov/Wert cc pt c/o prod cough x 3 mo, mucus-yellow in color, pt states coughing leads to vomiting spells. Pt also c/o wheezing and fatigue. Pt has tx with Mucinex DM and ProAir-2 puff prn. In terms of sob > knees stop before sob, saba helps. Cough is worse afternoon, better p saba.  Sleeping poorly with feq  nocturnal  or early am exacerbation  of respiratory  c/o's and need for noct saba. Also denies any obvious fluctuation of symptoms with weather or environmental changes or other  aggravating or alleviating factors except as outlined above  rec Symbicort 80 Take 2 puffs first thing in am and then another 2 puffs about 12 hours later.  Augmentin x 10 days take bfast and supper and eat yogurt at lunch Prednisone 10 mg take  4 each am x 2 days,   2 each am x 2 days,  1 each am x 2days and stop  PFT's on return   09/03/2011 f/u ov/Wert cc cough still comes and goes, no longer vomiting, intermittently  brown sputum, less sob not limited by breathing. No real pattern to variability of cough but better since rx with symbicort.  rec When symbicort runs out change to dulera 100 Take 2 puffs first thing in am and then another 2 puffs about 12 hours later.  mucinex or mucinex dm best choice for cough If mucus gets real nasty or you feel like you typically do with pneumonia ok to go ahead and use the augmentin x 10 days.   09/21/2011 f/u ov/Wert overall improved since on maint laba/ics cc persistent severe cough mostly daytime not usually waking him up unless flare and   No   purulent sputum or sinus/hb symptoms on present rx. rec Immune profile nl mucinex dm prn   01/07/2012 f/u ov/Wert some better for weeks then worse end of august > augmentin > less nasty colored mucus x sev weeks,  Then augmentin again completed one night prior to OV  But  More sob on day of ov, not using mucinex dm or flutter but has both. No hemoptysis  Sleeping ok without nocturnal  or early am exacerbation  of respiratory  c/o's or need for noct saba. Also denies any obvious fluctuation of symptoms with weather or environmental changes or other aggravating or alleviating factors except as outlined above    ROS  The following are not active complaints unless bolded sore throat, dysphagia, dental problems, itching, sneezing,  nasal congestion or excess/ purulent secretions, ear ache,   fever, chills, sweats, unintended wt loss, pleuritic or exertional cp, hemoptysis,  orthopnea pnd or leg swelling, presyncope, palpitations, heartburn, abdominal pain, anorexia, nausea, vomiting, diarrhea  or change in bowel or urinary habits, change in stools or urine, dysuria,hematuria,  rash, arthralgias, visual complaints, headache, numbness weakness or ataxia or problems with walking or coordination,  change in mood/affect or memory.                      Objective:   Physical Exam  GEN: A/Ox3; pleasant , NAD  Wt 219 > 222  11/11/2010  > 08/13/2011  208 > 09/03/2011  202> 202 09/21/2011 > 01/07/2012   HEENT:  [place]/AT,  EACs-clear, TMs-wnl, NOSE-clear, THROAT-clear, no lesions, no postnasal drip or exudate noted.     NECK:  Supple w/ fair ROM; no JVD; normal carotid impulses w/o bruits; no thyromegaly or nodules palpated; no lymphadenopathy.  RESP  Distant  BS w/ few exp rhonchi, dimished BS in bases .no accessory muscle use, no dullness to percussion  CARD:  RRR, no m/r/g  , no peripheral edema, pulses intact, no cyanosis or clubbing.  GI:   Soft & nt; nml bowel sounds; no organomegaly or masses detected.  Musco: Warm bil, no deformities or joint swelling noted.        CXR  01/07/2012 : 1. Persistent opacity at the right lung base likely represents a component of residual bronchiectasis, scarring, and chronic volume loss in the right lower lobe. It is  difficult to exclude the possibility of infection in this area although overall it does not appear significantly changed from 09/03/2011. 2. Similarly, there are is scarring and mild bronchiectasis in the left lower lobe which appears stable. I do not see a new opacity to suggest interval pneumonia.  .         Assessment & Plan:

## 2012-01-07 NOTE — Assessment & Plan Note (Signed)
-   CT 07/10/2004 Marked bronchiectatic change in the anterior right lower lobe and to a much lesser degree in the right middle lobe.      - Alpha one AT genotype sent 11/11/2010 > MM     -  HFA 90% 09/03/2011      -  PFT's 09/18/2011 FEV1  2.79 (83%) ratio 64 and no better p B2, DLCO 122%     -  Quant Ig 09/21/2011 >  wnl  S/p recent flare with inadequate use of action plan as previously discussed and confusion over prns.   To keep things simple, I have asked the patient to first separate medicines that are perceived as maintenance, that is to be taken daily "no matter what", from those medicines that are taken on only on an as-needed basis and I have given the patient examples of both, and then return to see our NP to generate a  detailed  medication calendar which should be followed until the next physician sees the patient and updates it.    For now rx with Prednisone 10 mg take  4 each am x 2 days,   2 each am x 2 days,  1 each am x2days and stop. See instructions for specific recommendations which were reviewed directly with the patient who was given a copy with highlighter outlining the key components.

## 2012-01-07 NOTE — Patient Instructions (Addendum)
Prednisone 10 mg take  4 each am x 2 days,   2 each am x 2 days,  1 each am x2days and stop   Increase dulera 200 Take 2 puffs first thing in am and then another 2 puffs about 12 hours later.   For cough, congestion, thick mucus > mucinex dm 1200 mg every 12 hours and use flutter valve as much as you need to help bring up the mucus  For severe cough use the hydrocodone (vicodin) up to every 4 hours as needed    See Tammy NP w/in 2 weeks with all your medications, even over the counter meds, separated in two separate bags, the ones you take no matter what vs the ones you stop once you feel better and take only as needed when you feel you need them.   Tammy  will generate for you a new user friendly medication calendar that will put [place] all on the same page re: your medication use.     Without this process, it simply isn't possible to assure that we are providing  your outpatient care  with  the attention to detail we feel you deserve.   If we cannot assure that you're getting that kind of care,  then we cannot manage your problem effectively from this clinic.  Once you have seen Tammy and we are sure that we're all on the same page with your medication use she will arrange follow up with me.

## 2012-01-21 ENCOUNTER — Ambulatory Visit (INDEPENDENT_AMBULATORY_CARE_PROVIDER_SITE_OTHER): Payer: 59 | Admitting: Adult Health

## 2012-01-21 ENCOUNTER — Encounter: Payer: Self-pay | Admitting: Adult Health

## 2012-01-21 VITALS — BP 118/70 | HR 71 | Temp 97.0°F | Ht 70.0 in | Wt 190.2 lb

## 2012-01-21 DIAGNOSIS — J479 Bronchiectasis, uncomplicated: Secondary | ICD-10-CM

## 2012-01-21 MED ORDER — AMOXICILLIN-POT CLAVULANATE 875-125 MG PO TABS: 1.0000 | ORAL_TABLET | Freq: Two times a day (BID) | ORAL | Status: AC

## 2012-01-21 MED ORDER — HYDROCODONE-HOMATROPINE 5-1.5 MG/5ML PO SYRP: 5.0000 mL | ORAL_SOLUTION | Freq: Four times a day (QID) | ORAL | Status: AC | PRN

## 2012-01-21 NOTE — Patient Instructions (Addendum)
Follow med calendar closely and bring to each visit.  May use Hycodan 1 tsp every 6 hr As needed  Cough. -may make you sleepy.  Flutter valve as directed  Follow up Dr. Sherene Sires  In 2-3 months and As needed

## 2012-01-25 NOTE — Assessment & Plan Note (Signed)
Cont on current regimen  Plan Follow med calendar closely and bring to each visit.  May use Hycodan 1 tsp every 6 hr As needed  Cough. -may make you sleepy.  Flutter valve as directed  Follow up Dr. Sherene Sires  In 2-3 months and As needed

## 2012-01-25 NOTE — Progress Notes (Signed)
Subjective:    Patient ID: [name], male    DOB: 1955-10-22   MRN: [id number]  HPI 50 yowm   never smoker  with seen for initial pulmonary consult 10/19/10 for RML, RLL PNA w/ abscess in setting of longstanding bronchiectasis.  11/06/10 Post Hospital  Pt presents for post hospital follow up . He was admitted 10/19/2010 - 10/29/2010  For a  Right middle lobe, right lower lobe pneumonia with abscess, status post bronchoscopy. ,  Right middle lobe hypoplasia, by history.  Questionable severe chronic obstructive pulmonary disease, status post PFTs. Will need repeat once stablized .   Periapical tooth abscess.  CT scan was performed, which did show an abscess in the right lung.  Was seen by  Cardiothoracic Surgery, also evaluated the patient and felt that he is likely to need resection in the future. He did undergo bronchoscopy as well as PFT which showed severe COPD.-(he is a never smoker)    He was noted to have periapical tooth abscess. He was seen by Dr. Kristin Bruins here, but it was not felt this was likely contributory to his lung abscess. Dr. Kristin Bruins will again help coordinate outpatient followup He underwent  Bronchoscopy, October 27, 2010, which shows thick purulent sputum  emanating from the right middle lobe. Neg cytology for malignancy.  PFTs showed FEV1 of 1.78 and FVC of 2.50.[name] CT chest, August 1 1: Right lower lobe consolidation with cystic and bronchiectasis changes with a 5.2 x 8 cm gas fluid collection compatible with pneumonia associated with infected bulla/abscess. [name], August 3: Question periapical abscess associated with the left lower second most posterior tooth. . Chest x-ray, August 8: Improving aeration of the right lung base. Persistent cavitary right lower lobe lesion with air-fluid level consistent with pulmonary abscess. Unchanged small right pleural effusion.   Since discharge reports breathing has improved but still having prod cough with yellow-dark brown mucus  that causes right chest discomfort.  requesting refill on promethazine-codeine syrup. He was discharged on Clindamycin and Cipro with plans for prolonged abx.  Still feels weak and run down.  rec Continue on antibiotics as planned. Yogurt daily  Prilosec 20mg  daily before meals.  GERD diet.    11/11/2010 f/u ov/Wert cc minimally improved with severe cough to point of vomiting but fever appears to have abated and no diarrhea or abd pain on clindamycin.  R side chest discomfort during severe cough only. No resting sob rec GERD diet Increase Try prilosec 20mg   Take two  30-60 min before first meal of the day and Pepcid 20 mg one bedtime until cough is completely gone for at least a week without the need for cough suppression Continue mucinex dm 1200mg  twice daily = total 2400  Mg in 24 hours then supplement with the codeine syrup. We will send your blood for alpha one antitrypsin > MM  12/05/2010 right thoracotomy resection of right middle lobe and resection of right lower lobe abscess   08/13/2011 f/u ov/Wert cc pt c/o prod cough x 3 mo, mucus-yellow in color, pt states coughing leads to vomiting spells. Pt also c/o wheezing and fatigue. Pt has tx with Mucinex DM and ProAir-2 puff prn. In terms of sob > knees stop before sob, saba helps. Cough is worse afternoon, better p saba.  Sleeping poorly with feq  nocturnal  or early am exacerbation  of respiratory  c/o's and need for noct saba. Also denies any obvious fluctuation of symptoms with weather or environmental changes or other  aggravating or alleviating factors except as outlined above  rec Symbicort 80 Take 2 puffs first thing in am and then another 2 puffs about 12 hours later.  Augmentin x 10 days take bfast and supper and eat yogurt at lunch Prednisone 10 mg take  4 each am x 2 days,   2 each am x 2 days,  1 each am x 2days and stop  PFT's on return   09/03/2011 f/u ov/Wert cc cough still comes and goes, no longer vomiting, intermittently  brown sputum, less sob not limited by breathing. No real pattern to variability of cough but better since rx with symbicort.  rec When symbicort runs out change to dulera 100 Take 2 puffs first thing in am and then another 2 puffs about 12 hours later.  mucinex or mucinex dm best choice for cough If mucus gets real nasty or you feel like you typically do with pneumonia ok to go ahead and use the augmentin x 10 days.   09/21/2011 f/u ov/Wert overall improved since on maint laba/ics cc persistent severe cough mostly daytime not usually waking him up unless flare and   No   purulent sputum or sinus/hb symptoms on present rx. rec Immune profile nl mucinex dm prn   01/07/2012 f/u ov/Wert some better for weeks then worse end of august > augmentin > less nasty colored mucus x sev weeks,  Then augmentin again completed one night prior to OV  But  More sob on day of ov, not using mucinex dm or flutter but has both. No hemoptysis >pred taper   01/21/12 Follow up and med review  Patient returns for a two-week followup and medication review. We reviewed all his medications and organized them into a medication calendar with patient education. It appears that patient is taking his medications correctly. Last visit. Patient with a COPD flare. Treated with prednisone taper and Augmentin Patient feels that he is somewhat improved He denies any hemoptysis, orthopnea, PND, or leg swelling             ROS See HPI           Objective:   Physical Exam  GEN: A/Ox3; pleasant , NAD  Wt 219 > 222 11/11/2010  > 08/13/2011  208 > 09/03/2011  202> 202 09/21/2011 > 189 01/07/2012   01/21/12 190   HEENT:  [place]/AT,  EACs-clear, TMs-wnl, NOSE-clear, THROAT-clear, no lesions, no postnasal drip or exudate noted.     NECK:  Supple w/ fair ROM; no JVD; normal carotid impulses w/o bruits; no thyromegaly or nodules palpated; no lymphadenopathy.  RESP   dimished BS in bases .no accessory muscle use, no dullness to  percussion  CARD:  RRR, no m/r/g  , no peripheral edema, pulses intact, no cyanosis or clubbing.  GI:   Soft & nt; nml bowel sounds; no organomegaly or masses detected.  Musco: Warm bil, no deformities or joint swelling noted.        CXR  01/07/2012 : 1. Persistent opacity at the right lung base likely represents a component of residual bronchiectasis, scarring, and chronic volume loss in the right lower lobe. It is difficult to exclude the possibility of infection in this area although overall it does not appear significantly changed from 09/03/2011. 2. Similarly, there are is scarring and mild bronchiectasis in the left lower lobe which appears stable. I do not see a new opacity to suggest interval pneumonia.  .         Assessment &  Plan:

## 2012-02-03 ENCOUNTER — Other Ambulatory Visit: Payer: Self-pay | Admitting: Dermatology

## 2012-02-08 ENCOUNTER — Telehealth: Payer: Self-pay | Admitting: Internal Medicine

## 2012-02-08 MED ORDER — MOMETASONE FURO-FORMOTEROL FUM 200-5 MCG/ACT IN AERO: INHALATION_SPRAY | RESPIRATORY_TRACT | Status: DC

## 2012-02-08 NOTE — Telephone Encounter (Signed)
I spoke with pt-1 sample of dulera and coupon has been placed upfront for pick up. Nothing further was needed

## 2012-03-18 ENCOUNTER — Ambulatory Visit: Payer: Self-pay | Admitting: Internal Medicine

## 2012-03-27 ENCOUNTER — Other Ambulatory Visit: Payer: Self-pay | Admitting: Adult Health

## 2012-03-28 ENCOUNTER — Other Ambulatory Visit: Payer: Self-pay | Admitting: Internal Medicine

## 2012-03-28 MED ORDER — AMOXICILLIN-POT CLAVULANATE 875-125 MG PO TABS: 1.0000 | ORAL_TABLET | Freq: Two times a day (BID) | ORAL | Status: DC

## 2012-03-28 NOTE — Telephone Encounter (Signed)
Called spoke with patient who c/o prod cough with brown mucus, night sweats, occ tightness in chest, head congestion w/ brown drainage, PND, some hoarseness x4-5days - denies fever, SOB, wheezing.  Requesting refill augmentin.  Taking dulera and proair as prescribed, mucinex prn cough/congestion.   Last ov 10.31.13 med calendar w/ TP. Upcoming ov w/ MW 1.15.14 @ 3:15pm. [facility] Allerg[facility]  Allergen Reactions  . Morphine And Related Nausea And Vomiting   MW not in office this afternoon.  As pt last saw TP, will forward to her for recs.  Please advise, thanks.

## 2012-03-28 NOTE — Telephone Encounter (Signed)
augmentin 875mg  Twice daily  #20  No refills  mucinex dm Twice daily  As needed  Cough/congestion  Fluids and rest  follow up Dr. Sherene Sires  On 04/07/11 as planned  Please contact office for sooner follow up if symptoms do not improve or worsen or seek emergency care

## 2012-03-28 NOTE — Telephone Encounter (Signed)
Called spoke with patient, advised of TP's recs as stated below.  Pt verbalized his understanding and denied any questions.  Rx sent to verified pharmacy.

## 2012-04-05 ENCOUNTER — Ambulatory Visit: Payer: Self-pay | Admitting: Internal Medicine

## 2012-04-06 ENCOUNTER — Encounter: Payer: Self-pay | Admitting: Internal Medicine

## 2012-04-06 ENCOUNTER — Ambulatory Visit (INDEPENDENT_AMBULATORY_CARE_PROVIDER_SITE_OTHER)
Admission: RE | Admit: 2012-04-06 | Discharge: 2012-04-06 | Disposition: A | Discharge status: Home / Self Care | Payer: 59 | Source: Ambulatory Visit | Attending: Internal Medicine | Admitting: Internal Medicine

## 2012-04-06 ENCOUNTER — Ambulatory Visit (INDEPENDENT_AMBULATORY_CARE_PROVIDER_SITE_OTHER): Payer: 59 | Admitting: Internal Medicine

## 2012-04-06 VITALS — BP 132/82 | HR 92 | Temp 98.0°F | Ht 70.0 in | Wt 197.0 lb

## 2012-04-06 DIAGNOSIS — J479 Bronchiectasis, uncomplicated: Secondary | ICD-10-CM

## 2012-04-06 MED ORDER — HYDROCODONE-HOMATROPINE 5-1.5 MG/5ML PO SYRP: 5.0000 mL | ORAL_SOLUTION | Freq: Four times a day (QID) | ORAL | Status: DC | PRN

## 2012-04-06 MED ORDER — LEVOFLOXACIN 750 MG PO TABS: 750.0000 mg | ORAL_TABLET | Freq: Every day | ORAL | Status: DC

## 2012-04-06 NOTE — Progress Notes (Signed)
Subjective:    Patient ID: [name], male    DOB: 1955-04-02   MRN: [id number]  Brief patient profile:  60 yowm never smoker  with seen for initial pulmonary consult 10/19/10 for RML, RLL PNA w/ abscess in setting of longstanding bronchiectasis.   HPI 11/06/10 Post Hospital  Pt presents for post hospital follow up . He was admitted 10/19/2010 - 10/29/2010  For a  Right middle lobe, right lower lobe pneumonia with abscess, status post bronchoscopy. ,  Right middle lobe hypoplasia, by history.  Questionable severe chronic obstructive pulmonary disease, status post PFTs. Will need repeat once stablized .   Periapical tooth abscess.  CT scan was performed, which did show an abscess in the right lung.  Was seen by  Cardiothoracic Surgery, also evaluated the patient and felt that he is likely to need resection in the future. He did undergo bronchoscopy as well as PFT which showed severe COPD.-(he is a never smoker)    He was noted to have periapical tooth abscess. He was seen by Dr. Kristin [name] here, but it was not felt this was likely contributory to his lung abscess. Dr. Kristin [name] will again help coordinate outpatient followup He underwent  Bronchoscopy, October 27, 2010, which shows thick purulent sputum  emanating from the right middle lobe. Neg cytology for malignancy.  PFTs showed FEV1 of 1.78 and FVC of 2.50.[name] CT chest, August 1 1: Right lower lobe consolidation with cystic and bronchiectasis changes with a 5.2 x 8 cm gas fluid collection compatible with pneumonia associated with infected bulla/abscess. [name], August 3: Question periapical abscess associated with the left lower second most posterior tooth. . Chest x-ray, August 8: Improving aeration of the right lung base. Persistent cavitary right lower lobe lesion with air-fluid level consistent with pulmonary abscess. Unchanged small right pleural effusion.   Since discharge reports breathing has improved but still having prod cough with  yellow-dark brown mucus that causes right chest discomfort.  requesting refill on promethazine-codeine syrup. He was discharged on Clindamycin and Cipro with plans for prolonged abx.  Still feels weak and run down.  rec Continue on antibiotics as planned. Yogurt daily  Prilosec 20mg  daily before meals.  GERD diet.    11/11/2010 f/u ov/[name] cc minimally improved with severe cough to point of vomiting but fever appears to have abated and no diarrhea or abd pain on clindamycin.  R side chest discomfort during severe cough only. No resting sob rec GERD diet Increase Try prilosec 20mg   Take two  30-60 min before first meal of the day and Pepcid 20 mg one bedtime until cough is completely gone for at least a week without the need for cough suppression Continue mucinex dm 1200mg  twice daily = total 2400  Mg in 24 hours then supplement with the codeine syrup. We will send your blood for alpha one antitrypsin > MM  12/05/2010 right thoracotomy resection of right middle lobe and resection of right lower lobe abscess   08/13/2011 f/u ov/[name] cc pt c/o prod cough x 3 mo, mucus-yellow in color, pt states coughing leads to vomiting spells. Pt also c/o wheezing and fatigue. Pt has tx with Mucinex DM and ProAir-2 puff prn. In terms of sob > knees stop before sob, saba helps. Cough is worse afternoon, better p saba.  Sleeping poorly with feq  nocturnal  or early am exacerbation  of respiratory  c/o's and need for noct saba. Also denies any obvious fluctuation of symptoms with weather or environmental  changes or other aggravating or alleviating factors except as outlined above  rec Symbicort 80 Take 2 puffs first thing in am and then another 2 puffs about 12 hours later.  Augmentin x 10 days take bfast and supper and eat yogurt at lunch Prednisone 10 mg take  4 each am x 2 days,   2 each am x 2 days,  1 each am x 2days and stop  PFT's on return   09/03/2011 f/u ov/[name] cc cough still comes and goes, no longer  vomiting, intermittently brown sputum, less sob not limited by breathing. No real pattern to variability of cough but better since rx with symbicort.  rec When symbicort runs out change to dulera 100 Take 2 puffs first thing in am and then another 2 puffs about 12 hours later.  mucinex or mucinex dm best choice for cough If mucus gets real nasty or you feel like you typically do with pneumonia ok to go ahead and use the augmentin x 10 days.   09/21/2011 f/u ov/[name] overall improved since on maint laba/ics cc persistent severe cough mostly daytime not usually waking him up unless flare and   No   purulent sputum or sinus/hb symptoms on present rx. rec Immune profile nl mucinex dm prn   01/07/2012 f/u ov/[name] some better for weeks then worse end of august > augmentin > less nasty colored mucus x sev weeks,  Then augmentin again completed one night prior to OV  But  More sob on day of ov, not using mucinex dm or flutter but has both. No hemoptysis >pred taper   01/21/12 Follow up and med review  Patient returns for a two-week followup and medication review. We reviewed all his medications and organized them into a medication calendar with patient education. It appears that patient is taking his medications correctly. Last visit. Patient with a COPD flare. Treated with prednisone taper and Augmentin Patient feels that he is somewhat improved   04/06/2012 f/u ov/[name] cc since last ov has had 2 flares, first seemed better p augmentin then seemed worse sev weeks prior to OV  So took a second round of augmentin > on #9/10 days short of breath with adls and using saba every 4 hours plus reports mucus brown and having night sweats but no rigors, no pleuritic cp.  Sleeping ok without nocturnal  or early am exacerbation  of respiratory  c/o's or need for noct saba. Also denies any obvious fluctuation of symptoms with weather or environmental changes or other aggravating or alleviating factors except as  outlined above   ROS  The following are not active complaints unless bolded sore throat, dysphagia, dental problems, itching, sneezing,  nasal congestion or excess/ purulent secretions, ear ache,   fever, chills, sweats, unintended wt loss, pleuritic or exertional cp, hemoptysis,  orthopnea pnd or leg swelling, presyncope, palpitations, heartburn, abdominal pain, anorexia, nausea, vomiting, diarrhea  or change in bowel or urinary habits, change in stools or urine, dysuria,hematuria,  rash, arthralgias, visual complaints, headache, numbness weakness or ataxia or problems with walking or coordination,  change in mood/affect or memory.                        Objective:   Physical Exam  GEN: A/Ox3; pleasant , NAD  Wt  222 11/11/2010  > 08/13/2011  208 > 09/03/2011  202> 202 09/21/2011 > 189 01/07/2012>   01/21/12 190 >  04/06/2012  197   HEENT:  Singer/AT,  EACs-clear, TMs-wnl, NOSE-clear, THROAT-clear, no lesions, no postnasal drip or exudate noted.     NECK:  Supple w/ fair ROM; no JVD; normal carotid impulses w/o bruits; no thyromegaly or nodules palpated; no lymphadenopathy.  RESP   dimished BS in bases .no accessory muscle use, no dullness to percussion  CARD:  RRR, no m/r/g  , no peripheral edema, pulses intact, no cyanosis or clubbing.  GI:   Soft & nt; nml bowel sounds; no organomegaly or masses detected.  Musco: Warm bil, no deformities or joint swelling noted.       CXR  04/06/2012 :   New bilateral basilar airspace disease superimposed on chronic changes of the bases, with bilateral bronchiectasis. The findings are compatible with multifocal pneumonia on a background of chronic lung disease.            Assessment & Plan:

## 2012-04-06 NOTE — Patient Instructions (Addendum)
Levaquin 750 one daily x 5 days and sweats should be better, the mucus should turn white   Prednisone 10 mg take  4 each am x 2 days,   2 each am x 2 days,  1 each am x2days and stop   See calendar for specific medication instructions and bring it back for each and every office visit for every healthcare provider you see.  Without it,  you may not receive the best quality medical care that we feel you deserve.  You will note that the calendar groups together  your maintenance  medications that are timed at particular times of the day.  Think of this as your checklist for what your doctor has instructed you to do until your next evaluation to see what benefit  there is  to staying on a consistent group of medications intended to keep you well.  The other group at the bottom is entirely up to you to use as you see fit  for specific symptoms that may arise between visits that require you to treat them on an as needed basis.  Think of this as your action plan or "what if" list.   Separating the top medications from the bottom group is fundamental to providing you adequate care going forward.    Bronchiectasis =   you have scarring of your bronchial tubes which means that they don't function perfectly normally and mucus tends to pool in certain areas of your lung which can cause pneumonia and further scarring of your lung and bronchial tubes  Whenever you develop cough congestion take mucinex or mucinex dm > these will help keep the mucus loose and flowing and flutter valve  Please see patient coordinator before you leave today  to schedule evaluation at [facility] for bronchiectasis

## 2012-04-06 NOTE — Assessment & Plan Note (Addendum)
-   CT 07/10/2004 Marked bronchiectatic change in the anterior right lower lobe and to a much lesser degree in the right middle lobe.      - Alpha one AT genotype sent 11/11/2010 > MM     -  HFA 90% 09/03/2011      -  PFT's 09/18/2011 FEV1  2.79 (83%) ratio 64 and no better p B2, DLCO 122%     -  Quant Ig 09/21/2011 >  wnl  Acute flare with bilateral infiltrates > try up to 10 days levaquin as not responding to augmentin, then may need fob vs referral to [facility] for further w/u    Each maintenance medication was reviewed in detail including most importantly the difference between maintenance and as needed and under what circumstances the prns are to be used. This was done in the context of a medication calendar review which provided the patient with a user-friendly unambiguous mechanism for medication administration and reconciliation and provides an action plan for all active problems. It is critical that this be shown to every doctor  for modification during the office visit if necessary so the patient can use it as a working document.

## 2012-04-07 ENCOUNTER — Telehealth: Payer: Self-pay | Admitting: Internal Medicine

## 2012-04-07 NOTE — Telephone Encounter (Signed)
Spoke with the pt to give cxr results I called the pred in for him and also advised pharmacist to add 1 refill on the levaquin  Pt states nothing further needed

## 2012-04-07 NOTE — Progress Notes (Signed)
Quick Note:  Spoke with pt and notified of results per Dr. Wert. Pt verbalized understanding and denied any questions.  ______ 

## 2012-04-14 ENCOUNTER — Telehealth: Payer: Self-pay | Admitting: Internal Medicine

## 2012-04-14 NOTE — Telephone Encounter (Signed)
lmtcb x1 w/ co-worker 

## 2012-04-15 NOTE — Telephone Encounter (Signed)
Returning call.[name] ° °

## 2012-04-15 NOTE — Telephone Encounter (Signed)
Returning call can be reached at [phone].[name]

## 2012-04-15 NOTE — Telephone Encounter (Signed)
I spoke with [name]. She stated they will need xray's on disc's. I advised her that is done through our radiology dept over at [facility] cardiology. She will give them a call. She is also requesting to have all of [name]'s records sent over as well. Lab reports, [facility] from [facility]. Her Fax is [phone]. Looks like we referred pt over to them. Pt has not been seen in there office yet. Dr. Sherene Sires are you okay with [place] sending over [name]'s records as well. thanks

## 2012-04-15 NOTE — Telephone Encounter (Signed)
Yes, ok to send all records

## 2012-04-18 NOTE — Telephone Encounter (Signed)
[name], can you please send the records thanks!

## 2012-04-20 ENCOUNTER — Ambulatory Visit: Payer: Self-pay | Admitting: Internal Medicine

## 2012-04-21 ENCOUNTER — Telehealth: Payer: Self-pay | Admitting: Internal Medicine

## 2012-04-21 NOTE — Telephone Encounter (Signed)
Spoke with Suzette Battiest for verification of what she need, Additional reports  Faxed to [facility], ie;  spirometry reports , bronchial washing, which was already faxed.  Office has already requested CD of chest ct and 20 additional images to be sent, they have not received as of yet.  I called [facility] radiology and informed pt will pickup CD to take with him.  They will get ready for patient to pickup .Kandice Hams

## 2012-04-21 NOTE — Telephone Encounter (Signed)
Per Deveron Furlong D. Is looking into this.

## 2012-04-21 NOTE — Telephone Encounter (Signed)
Will forward to Alida to follow up and send whatever else is needed since she did referral.

## 2012-04-21 NOTE — Telephone Encounter (Signed)
Had to resend this to [place]. She was not aware of the request but will take care of it for the pt.

## 2012-04-21 NOTE — Telephone Encounter (Signed)
Spoke with [place] @ [facility] additional records has been faxed .Kandice Hams

## 2012-04-21 NOTE — Telephone Encounter (Signed)
Veronica from [facility] called back requesting hospital records be faxed to them as well. Leanora Ivanoff

## 2012-07-07 ENCOUNTER — Other Ambulatory Visit: Payer: Self-pay | Admitting: Otolaryngology

## 2012-07-07 DIAGNOSIS — H833X9 Noise effects on inner ear, unspecified ear: Secondary | ICD-10-CM

## 2012-07-07 DIAGNOSIS — H905 Unspecified sensorineural hearing loss: Secondary | ICD-10-CM

## 2012-07-07 DIAGNOSIS — H93299 Other abnormal auditory perceptions, unspecified ear: Secondary | ICD-10-CM

## 2012-07-11 ENCOUNTER — Ambulatory Visit
Admission: RE | Admit: 2012-07-11 | Discharge: 2012-07-11 | Disposition: A | Discharge status: Home / Self Care | Payer: 59 | Source: Ambulatory Visit | Attending: Otolaryngology | Admitting: Otolaryngology

## 2012-07-11 DIAGNOSIS — H905 Unspecified sensorineural hearing loss: Secondary | ICD-10-CM

## 2012-07-11 DIAGNOSIS — H833X9 Noise effects on inner ear, unspecified ear: Secondary | ICD-10-CM

## 2012-07-11 DIAGNOSIS — H93299 Other abnormal auditory perceptions, unspecified ear: Secondary | ICD-10-CM

## 2012-07-11 MED ORDER — GADOBENATE DIMEGLUMINE 529 MG/ML IV SOLN
19.0000 mL | Freq: Once | INTRAVENOUS | Status: AC | PRN
  Administered 2012-07-11: 19 mL via INTRAVENOUS

## 2012-07-21 ENCOUNTER — Other Ambulatory Visit: Payer: Self-pay | Admitting: Internal Medicine

## 2012-07-21 MED ORDER — MOMETASONE FURO-FORMOTEROL FUM 200-5 MCG/ACT IN AERO: INHALATION_SPRAY | RESPIRATORY_TRACT | Status: AC

## 2012-07-21 NOTE — Telephone Encounter (Signed)
Received faxed refill request from [facility] requesting a 90-day supply Dulera 200 Last ov 1.15.14 w/ MW Rx sent

## 2012-07-26 ENCOUNTER — Encounter: Payer: Self-pay | Admitting: Neurology

## 2012-07-26 ENCOUNTER — Ambulatory Visit (INDEPENDENT_AMBULATORY_CARE_PROVIDER_SITE_OTHER): Payer: 59 | Admitting: Neurology

## 2012-07-26 VITALS — BP 110/73 | HR 80 | Ht 70.5 in | Wt 199.0 lb

## 2012-07-26 DIAGNOSIS — R413 Other amnesia: Secondary | ICD-10-CM

## 2012-07-26 DIAGNOSIS — R42 Dizziness and giddiness: Secondary | ICD-10-CM

## 2012-07-26 HISTORY — DX: Dizziness and giddiness: R42

## 2012-07-26 MED ORDER — NORTRIPTYLINE HCL 10 MG PO CAPS: ORAL_CAPSULE | ORAL | Status: DC

## 2012-07-26 NOTE — Progress Notes (Signed)
Reason for visit: Dizziness  [name] is a 57 y.o. male  History of present illness:  [name] is a 57 year old right-handed white male with a history of dizziness that began approximately 11 weeks prior to this evaluation. The patient indicates that he has had problems with bronchiectasis, and he had a PICC line in, and he was getting antibiotics on a regular basis. The patient indicates that the dizziness began around that time he completed his course of IV antibiotics. The patient has gone on Bactrim and Avelox orally, which she continues to take. The patient believes that the dizziness began before he began those oral antibiotics. The patient has undergone MRI evaluation of the brain that shows no central nervous system lesions. The patient has also had an ENT evaluation, and audiometric testing revealed no hearing deficit. The patient indicates that the dizziness is associated with a rocking sensation of the head, and a heavy sensation of the head. The patient has no true spinning sensations, and he only feels bad when he is up on his feet moving around. The patient indicates that when he sits or he lies down, he feels well. The patient has undergone the Epley maneuvers without any benefit with his dizziness. The patient is on meclizine, and he does believe that his helped some. The patient denies any ringing in the ears or ear pain. The patient denies any double vision, slurred speech, or problems swallowing. The patient has had some headaches over the last one month, and he may have one or 2 headaches a week in the bitemporal regions. The patient does have chronic neck stiffness associated with prior cervical spine surgery, but there has been no increase in the neck stiffness. The degree of dizziness is about the same throughout the day. The patient denies any numbness or tingling sensations on the arms or legs or face. The patient denies any weakness, but he occasionally will have some  episodes of nausea. Patient has had problems with constipation, and urinary frequency at night. The patient is sent to this office for further evaluation. The patient is also concerned about some problems with short-term memory issues that have developed over the last several months.  Past Medical History  Diagnosis Date  . HTN (hypertension)   . DJD (degenerative joint disease)   . Pneumonia   . Pneumonia with lung abscess   . Bronchiolitis   . Dizziness and giddiness 07/26/2012  . Tear of left rotator cuff   . Gastroesophageal reflux disease   . Bronchiectasis     Past Surgical History  Procedure Laterality Date  . Knee arthroscopy      left  . Back surgery      multiple, DJD, cervical fusion, plate removed  . Video bronchoscopy      Dr Edwyna Shell  . Right video-assisted thoracoscopic surgery,  12/05/2010  . Tonsillectomy  1965  . Appendectomy  1969  . Neck surgery  [id number], 2002  . Lung surgery  2012,     removed right middle lobe,due to birth defect, removed half of right bottom lobe due to abcess  . Rotator cuff repair      Family History  Problem Relation Age of Onset  . Stroke Mother   . Alzheimer's disease Mother   . Transient ischemic attack Mother   . Liver disease Father   . Diabetes Father   . Congestive Heart Failure Father   . Other Other     sibling with possible right middle  lobe issues    Social history:  reports that he has never smoked. He does not have any smokeless tobacco history on file. He reports that  drinks alcohol. He reports that he does not use illicit drugs.  Medications:  Current Outpatient Prescriptions on File Prior to Visit  Medication Sig Dispense Refill  . albuterol (PROAIR HFA) 108 (90 BASE) MCG/ACT inhaler Inhale 2 puffs into the lungs every 4 (four) hours as needed.       [name] Dextromethorphan-Guaifenesin (MUCINEX DM MAXIMUM STRENGTH) 60-1200 MG TB12 Take 1 tablet by mouth every 12 (twelve) hours as needed.      . famotidine (PEPCID)  20 MG tablet Take 20 mg by mouth at bedtime.       . hydrochlorothiazide (HYDRODIURIL) 25 MG tablet Take 25 mg by mouth daily.        [name] HYDROcodone-acetaminophen (NORCO/VICODIN) 5-325 MG per tablet Take 1 tablet by mouth every 8 (eight) hours as needed.      [name] HYDROcodone-homatropine (HYCODAN) 5-1.5 MG/5ML syrup Take 5 mLs by mouth every 6 (six) hours as needed for cough.  240 mL  0  . levofloxacin (LEVAQUIN) 750 MG tablet Take 1 tablet (750 mg total) by mouth daily. One daily stop if develop aching in joints/ muscles  5 tablet  0  . meloxicam (MOBIC) 15 MG tablet Take 15 mg by mouth daily as needed.       . mometasone-formoterol (DULERA) 200-5 MCG/ACT AERO Take 2 puffs first thing in am and then another 2 puffs about 12 hours later.  3 Inhaler  1  . omeprazole (PRILOSEC) 20 MG capsule Take 20 mg by mouth daily.       [name] Respiratory Therapy Supplies (FLUTTER) DEVI Use several times daily as needed for congestion/thick mucus       No current facility-administered medications on file prior to visit.    Allergies:  Allergies  Allergen Reactions  . Morphine And Related Nausea And Vomiting    ROS:  Out of a complete 14 system review of symptoms, the patient complains only of the following symptoms, and all other reviewed systems are negative.  Fevers, chills, fatigue shortness of breath, cough, wheezing, snoring Constipation Urinary frequency Moles Feeling hot, cold Frequent lung infections Memory loss, confusion, headache, generalized weakness, dizziness Not enough sleep, decreased energy, snoring  Blood pressure 110/73, pulse 80, height 5' 10.5" (1.791 m), weight 199 lb (90.266 kg).  Blood pressure standing, right arm is 124/68. Blood pressure sitting, right arm, is 120/68.  Physical Exam  General: The patient is alert and cooperative at the time of the examination.  Head: Pupils are equal, round, and reactive to light. Discs are flat bilaterally.  Neck: The neck is supple, no  carotid bruits are noted.  Respiratory: The respiratory examination is clear.  Cardiovascular: The cardiovascular examination reveals a regular rate and rhythm, no obvious murmurs or rubs are noted.  Skin: Extremities are without significant edema.  Neurologic Exam  Mental status: Mini-Mental status examination done today shows a total score of 30 out of 30. The patient is able to name 23 animals in 60 seconds.  Cranial nerves: Facial symmetry is present. There is good sensation of the face to pinprick and soft touch bilaterally. The strength of the facial muscles and the muscles to head turning and shoulder shrug are normal bilaterally. Speech is well enunciated, no aphasia or dysarthria is noted. Extraocular movements are full. Visual fields are full.  Motor: The motor testing reveals 5  over 5 strength of all 4 extremities. Good symmetric motor tone is noted throughout.  Sensory: Sensory testing is intact to pinprick, soft touch, vibration sensation, and position sense on all 4 extremities. No evidence of extinction is noted.  Coordination: Cerebellar testing reveals good finger-nose-finger and heel-to-shin bilaterally.  Gait and station: Gait is slightly wide-based. Tandem gait is unsteady. Romberg is negative. No drift is seen.  Reflexes: Deep tendon reflexes are symmetric and normal bilaterally with the exception that the ankle jerk reflexes are depressed bilaterally. Toes are downgoing bilaterally.   Assessment/Plan:  1. Dizziness  2. Memory disturbance  The patient has been on a multitude of antibiotics recently, which could potentially result in some vestibular toxicity. The patient however, does not have true vertigo, but he reports a heavy sensation in the head, occasional headaches, and dizziness only with standing and moving around. The patient is not orthostatic on the evaluation today. The patient will be sent for blood work today, and he will be placed on nortriptyline.  It is possible patient may have a low-grade muscle tension type headache. The patient will followup in 3 months.  Marlan Palau MD 07/26/2012 9:20 PM  [facility] [address] [place], [place] [address]  Phone [phone] Fax [phone]

## 2012-07-27 ENCOUNTER — Telehealth: Payer: Self-pay | Admitting: Neurology

## 2012-07-27 LAB — COMPREHENSIVE METABOLIC PANEL
ALT: 18 IU/L (ref 0–44)
AST: 18 IU/L (ref 0–40)
Albumin/Globulin Ratio: 2 (ref 1.1–2.5)
Albumin: 4.7 g/dL (ref 3.5–5.5)
Alkaline Phosphatase: 63 IU/L (ref 39–117)
BUN/Creatinine Ratio: 23 — ABNORMAL HIGH (ref 9–20)
BUN: 36 mg/dL — ABNORMAL HIGH (ref 6–24)
CO2: 24 mmol/L (ref 19–28)
Calcium: 9.5 mg/dL (ref 8.7–10.2)
Chloride: 98 mmol/L (ref 97–108)
Creatinine, Ser: 1.6 mg/dL — ABNORMAL HIGH (ref 0.76–1.27)
GFR calc Af Amer: 54 mL/min/{1.73_m2} — ABNORMAL LOW (ref >59)
GFR calc non Af Amer: 47 mL/min/{1.73_m2} — ABNORMAL LOW (ref >59)
Globulin, Total: 2.3 g/dL (ref 1.5–4.5)
Glucose: 80 mg/dL (ref 65–99)
Potassium: 3.9 mmol/L (ref 3.5–5.2)
Sodium: 137 mmol/L (ref 134–144)
Total Bilirubin: 0.6 mg/dL (ref 0.0–1.2)
Total Protein: 7 g/dL (ref 6.0–8.5)

## 2012-07-27 LAB — CBC WITH DIFFERENTIAL
Basophils Absolute: 0 10*3/uL (ref 0.0–0.2)
Basos: 0 % (ref 0–3)
Eos: 2 % (ref 0–5)
Eosinophils Absolute: 0.1 10*3/uL (ref 0.0–0.4)
HCT: 45.2 % (ref 37.5–51.0)
Hemoglobin: 15.5 g/dL (ref 12.6–17.7)
Immature Grans (Abs): 0 10*3/uL (ref 0.0–0.1)
Immature Granulocytes: 0 % (ref 0–2)
Lymphocytes Absolute: 1.7 10*3/uL (ref 0.7–3.1)
Lymphs: 37 % (ref 14–46)
MCH: 29.1 pg (ref 26.6–33.0)
MCHC: 34.3 g/dL (ref 31.5–35.7)
MCV: 85 fL (ref 79–97)
Monocytes Absolute: 0.6 10*3/uL (ref 0.1–0.9)
Monocytes: 13 % — ABNORMAL HIGH (ref 4–12)
Neutrophils Absolute: 2.2 10*3/uL (ref 1.4–7.0)
Neutrophils Relative %: 48 % (ref 40–74)
Platelets: 140 10*3/uL — ABNORMAL LOW (ref 155–379)
RBC: 5.33 x10E6/uL (ref 4.14–5.80)
RDW: 14.7 % (ref 12.3–15.4)
WBC: 4.6 10*3/uL (ref 3.4–10.8)

## 2012-07-27 LAB — TSH: TSH: 1.35 u[IU]/mL (ref 0.450–4.500)

## 2012-07-27 LAB — VITAMIN B12: Vitamin B-12: 738 pg/mL (ref 211–946)

## 2012-07-27 NOTE — Telephone Encounter (Signed)
I called the patient. The blood work was unremarkable with a B12 and thyroid profile. The patient has had a change in his kidney function, with a BUN and creatinine that are now elevated. The patient may be dehydrated. The patient is divorced fluids over the next 2 weeks, and we will recheck a bmet at that time.

## 2012-08-08 ENCOUNTER — Other Ambulatory Visit: Payer: Self-pay | Admitting: Neurology

## 2012-08-08 DIAGNOSIS — Z5181 Encounter for therapeutic drug level monitoring: Secondary | ICD-10-CM

## 2012-08-17 ENCOUNTER — Encounter: Payer: Self-pay | Admitting: Neurology

## 2012-08-17 ENCOUNTER — Telehealth: Payer: Self-pay | Admitting: Neurology

## 2012-08-17 NOTE — Telephone Encounter (Signed)
I called the patient. The BUN and Cr is still elevated, with the values of 35/1.45. The patient will see his primary MD. ? May need to come off of the HCTZ and Mobic.

## 2012-08-29 ENCOUNTER — Encounter: Payer: Self-pay | Admitting: Neurology

## 2012-09-09 ENCOUNTER — Ambulatory Visit: Payer: 59 | Attending: Family Medicine | Admitting: Rehabilitative and Restorative Service Providers"

## 2012-09-09 DIAGNOSIS — IMO0001 Reserved for inherently not codable concepts without codable children: Secondary | ICD-10-CM | POA: Insufficient documentation

## 2012-09-09 DIAGNOSIS — R269 Unspecified abnormalities of gait and mobility: Secondary | ICD-10-CM | POA: Insufficient documentation

## 2012-09-14 ENCOUNTER — Ambulatory Visit: Payer: 59 | Admitting: Rehabilitative and Restorative Service Providers"

## 2012-09-16 ENCOUNTER — Ambulatory Visit: Payer: 59 | Admitting: Rehabilitative and Restorative Service Providers"

## 2012-09-19 ENCOUNTER — Ambulatory Visit: Payer: 59 | Admitting: Rehabilitative and Restorative Service Providers"

## 2012-09-21 ENCOUNTER — Ambulatory Visit: Payer: 59 | Attending: Family Medicine | Admitting: Rehabilitative and Restorative Service Providers"

## 2012-09-21 DIAGNOSIS — IMO0001 Reserved for inherently not codable concepts without codable children: Secondary | ICD-10-CM | POA: Insufficient documentation

## 2012-09-21 DIAGNOSIS — R269 Unspecified abnormalities of gait and mobility: Secondary | ICD-10-CM | POA: Insufficient documentation

## 2012-09-22 ENCOUNTER — Ambulatory Visit: Payer: 59 | Admitting: Rehabilitative and Restorative Service Providers"

## 2012-09-26 ENCOUNTER — Ambulatory Visit: Payer: 59 | Admitting: Rehabilitative and Restorative Service Providers"

## 2012-09-29 ENCOUNTER — Ambulatory Visit: Payer: 59 | Admitting: Rehabilitative and Restorative Service Providers"

## 2012-10-03 ENCOUNTER — Ambulatory Visit: Payer: 59 | Admitting: Rehabilitative and Restorative Service Providers"

## 2012-10-07 ENCOUNTER — Ambulatory Visit: Payer: 59 | Admitting: Rehabilitative and Restorative Service Providers"

## 2012-10-10 ENCOUNTER — Ambulatory Visit: Payer: 59 | Admitting: Rehabilitative and Restorative Service Providers"

## 2012-10-12 ENCOUNTER — Ambulatory Visit: Payer: 59 | Admitting: Rehabilitative and Restorative Service Providers"

## 2012-10-18 ENCOUNTER — Ambulatory Visit: Payer: 59 | Admitting: Rehabilitative and Restorative Service Providers"

## 2012-11-17 ENCOUNTER — Other Ambulatory Visit: Payer: Self-pay | Admitting: Neurology

## 2013-02-03 ENCOUNTER — Other Ambulatory Visit: Payer: Self-pay | Admitting: Dermatology

## 2013-02-06 ENCOUNTER — Telehealth: Payer: Self-pay | Admitting: Internal Medicine

## 2013-02-06 NOTE — Telephone Encounter (Signed)
Ok to set up sleep consult with one of our guys

## 2013-02-06 NOTE — Telephone Encounter (Signed)
Spoke with the pt and scheduled sleep consult with [facility] for 03/14/13 Pt aware to arrive 15 min prior to fill out form  Nothing further needed

## 2013-02-06 NOTE — Telephone Encounter (Signed)
I called and spoke with pt. He reports Dr. Gerri Spore from [place] was suppose to be sending [place] a fx about pt getting a sleep study done. He reports she was suppose to send this several months ago to [place]. I advised him MW may want him to see a sleep physician. Please advise Dr. Sherene Sires thanks

## 2013-02-20 ENCOUNTER — Encounter: Payer: Self-pay | Admitting: Neurology

## 2013-02-20 ENCOUNTER — Encounter (INDEPENDENT_AMBULATORY_CARE_PROVIDER_SITE_OTHER): Payer: Self-pay

## 2013-02-20 ENCOUNTER — Ambulatory Visit (INDEPENDENT_AMBULATORY_CARE_PROVIDER_SITE_OTHER): Payer: 59 | Admitting: Neurology

## 2013-02-20 VITALS — BP 142/81 | HR 103 | Resp 17 | Ht 69.5 in | Wt 203.0 lb

## 2013-02-20 DIAGNOSIS — J151 Pneumonia due to Pseudomonas: Secondary | ICD-10-CM | POA: Insufficient documentation

## 2013-02-20 DIAGNOSIS — G4710 Hypersomnia, unspecified: Secondary | ICD-10-CM

## 2013-02-20 NOTE — Patient Instructions (Signed)
Hypoxemia Hypoxemia occurs when your blood does not contain enough oxygen. The body cannot work well when it does not have enough oxygen, because every part of your body needs oxygen. Oxygen travels to all parts of the body through your blood. Hypoxemia can develop suddenly or can come on slowly. CAUSES Some common causes of hypoxemia include:  Long-term (chronic) lung diseases, such as chronic obstructive pulmonary disease (COPD) or interstitial lung disease.  Disorders that affect breathing at night, such as sleep apnea.  Fluid buildup in your lungs (pulmonary edema).  Lung infection (pneumonia).  Lung or throat cancer.  Abnormal blood flow that bypasses the lungs (shunt).  Certain diseasesthat affect nerves or muscles.  A collapsed lung (pneumothorax).  A blood clot in the lungs (pulmonary embolus).  Certain types of heart disease.  Slow or shallow breathing (hypoventilation).  Certain medicines.  High altitudes.  Toxic chemicals and gases. SIGNS AND SYMPTOMS Not everyone who has hypoxemia will develop symptoms. If the hypoxemia developed quickly, you will likely have symptoms such as shortness of breath. If the hypoxemia came on slowly over months or years, you may not notice any symptoms. Symptoms can include:  Shortness of breath (dyspnea).  Bluish color of the skin, lips, or nail beds.  Breathing that is fast, noisy, or shallow.  A fast heartbeat.  Feeling tired or sleepy.  Being confused or feeling anxious. DIAGNOSIS To determine if you have hypoxemia, your health care provider may perform:  A physical exam.  Blood tests.  A pulse oximetry. A sensor will be put on your finger, toe, or earlobe to measure the percent of oxygen in your blood. TREATMENT You will likely be treated with oxygen therapy. Depending on the cause of your hypoxemia, you may need oxygen for a short time (weeks or months), or you may need it indefinitely. Your health care provider  may also recommend other therapies to treat the underlying cause of your hypoxemia. HOME CARE INSTRUCTIONS  Only take over-the-counter or prescription medicines as directed by your health care provider.  Follow oxygen safety measures if you are on oxygen therapy. These may include:  Always having a backup supply of oxygen.  Not allowing anyone to smoke around oxygen.  Handling the oxygen tanks carefully and as instructed.  If you smoke, quit. Stay away from people who smoke.  Follow up with your health care provider as directed. SEEK MEDICAL CARE IF:  You have any concerns about your oxygen therapy.  You still have trouble breathing.  You become short of breath when you exercise.  You are tired when you wake up.  You have a headache when you wake up. SEEK IMMEDIATE MEDICAL CARE IF:   Your breathing gets worse.  You have new shortness of breath with normal activity.  You have a bluish color of the skin, lips, or nail beds.  You have confusion or cloudy thinking.  You cough up dark mucus.  You have chest pain.  You have a fever. MAKE SURE YOU:  Understand these instructions.  Will watch your condition.  Will get help right away if you are not doing well or get worse. Document Released: 09/22/2010 Document Revised: 11/09/2012 Document Reviewed: 10/06/2012 [facility] Patient Information 2014 [place], [place]. Polysomnography (Sleep Studies) Polysomnography (PSG) is a series of tests used for detecting (diagnosing) obstructive sleep apnea and other sleep disorders. The tests measure how some parts of your body are working while you are sleeping. The tests are extensive and expensive. They are done in  a sleep lab or hospital, and vary from center to center. Your caregiver may perform other more simple sleep studies and questionnaires before doing more complete and involved testing. Testing may not be covered by insurance. Some of these tests are:  An EEG  (Electroencephalogram). This tests your brain waves and stages of sleep.  An EOG (Electrooculogram). This measures the movements of your eyes. It detects periods of REM (rapid eye movement) sleep, which is your dream sleep.  An EKG (Electrocardiogram). This measures your heart rhythm.  EMG (Electromyography). This is a measurement of how the muscles are working in your upper airway and your legs while sleeping.  An oximetry measurement. It measures how much oxygen (air) you are getting while sleeping.  Breathing efforts may be measured. The same test can be interpreted (understood) differently by different caregivers and centers that study sleep.  Studies may be given an apnea/hypopnea index (AHI). This is a number which is found by counting the times of no breathing or under breathing during the night, and relating those numbers to the amount of time spent in bed. When the AHI is greater than 15, the patient is likely to complain of daytime sleepiness. When the AHI is greater than 30, the patient is at increased risk for heart problems and must be followed more closely. Following the AHI also allows you to know how treatment is working. Simple oximetry (tracking the amount of oxygen that is taken in) can be used for screening patients who:  Do not have symptoms (problems) of OSA.  Have a normal Epworth Sleepiness Scale Score.  Have a low pre-test probability of having OSA.  Have none of the upper airway problems likely to cause apnea.  Oximetry is also used to determine if treatment is effective in patients who showed significant desaturations (not getting enough oxygen) on their home sleep study. One extra measure of safety is to perform additional studies for the person who only snores. This is because no one can predict with absolute certainty who will have OSA. Those who show significant desaturations (not getting enough oxygen) are recommended to have a more detailed sleep  study. Document Released: 09/13/2002 Document Revised: 06/01/2011 Document Reviewed: 03/09/2005 [facility] Patient Information 2014 [place], [place].

## 2013-02-20 NOTE — Addendum Note (Signed)
Addended by: Melvyn Novas on: 02/20/2013 12:20 PM   Modules accepted: Orders

## 2013-02-20 NOTE — Progress Notes (Signed)
[facility]  Provider:  Melvyn Novas, M D  Referring Provider: Rozanna Box, MD Primary Care Physician:  Gretel Acre, MD  Chief Complaint  Patient presents with  . Follow-up    sleep consult  . Visual Field Change    HPI:  [name] is a 57 y.o. male  Is seen here as a referral/ revisit  from Dr. Larwance Sachs .  Reason for visit: hypersomnia, fatigue.     [name] has a quite extensive history of different forms of normal nail pneumonitis and pulmonary abscess ease. He has been on a tobramycin inhalation and albuterol nebulizers and inhalers. He's also using Mucinex he is using Pepcid for acid reflux, testosterone supplement, daily Bactrim, Pamelor at night, other, Lerer, MOBIC, hydrocordone for cough suppression at night. This reports that he became increasingly daytime sleepy and excessively fatigued over the past year beginning in late autumn 2013. This is pulmonary disease has progressed that has not resolved at this time he has also experienced significant side effects from some of the antibiotic treatment. Tobramycin and gentamicin have induced vestibulitis. His [facility] pulmonologist recommended a sleep study to evaluate if the patient has nocturnal hypoxemia and if his bronchiectasis may also have let to obstruction ,  compliance restriction within  the nature of his lung disease. The patient has been chronically orthopnoic, and tachycardic, and has undergone a pulmonectomy of the middle lobe and lower lobe of the right lung - 2012.    The patient relies on for breathing treatments a day, throughout the day. The patient has his latest daily treatment between 10 and 11 PM above for an hour and often falls asleep while taking the breathing treatment. Sometimes he would get jittery or tremulous after the breathing treatment. Usually by midnight he will fall asleep in his bed, he will sleep propped up on 2 pillows supine position and  used to have bathroom breaks at night but does not longer. He reports taking his nocturnal pain medication of on 10 PM which helps him to sleep through the night and Dr. Anne Hahn also prescribed Pamelor which helps sleep at night-  this is the brand name for nortriptyline. He will rise between 6 and 7 in the morning averaging about 6 hours of sleep at night. In the morning he feels unrefreshed and un-restored. He drinks 2 cups of coffee, he will naps on and a off all day, has an irresistible urge to fall asleep and notices that he is functioning physically and cognitively . These naps may last several hours.    Back pain is not a problem at night and doesn't seem to make it harder for him to sleep through the night given the current medication regimen.  There are no reported vivid dreams acting out dreams or parasomnias activities, nor sleep walking or sleep talking reported.   He has been told that he is a snorer , and this has been going on for many years. However with his recent pulmonary disease progression there may be restricted air movements, especially  possible CO2 retention.  Given that the patient is also on narcotic pain medications there is a higher risk for central apnea now. He reports sleep talking for about once a week in frequency now this is a fairly new development, which  begun in November ,he reports.         [name] is a 57 y.o. male  History of present illness:  Last visit note for Dr. Anne Hahn: [name] is a 57 year old right-handed white male with a history of dizziness that began approximately 11 weeks prior to this evaluation. The patient indicates that he has had problems with bronchiectasis, and he had a PICC line in, and he was getting antibiotics on a regular basis. The patient indicates that the dizziness began around that time he completed his course of IV antibiotics. The patient has gone on Bactrim and Avelox orally, which she continues to take. The  patient believes that the dizziness began before he began those oral antibiotics. The patient has undergone MRI evaluation of the brain that shows no central nervous system lesions. The patient has also had an ENT evaluation, and audiometric testing revealed no hearing deficit. The patient indicates that the dizziness is associated with a rocking sensation of the head, and a heavy sensation of the head. The patient has no true spinning sensations, and he only feels bad when he is up on his feet moving around. The patient indicates that when he sits or he lies down, he feels well. The patient has undergone the Epley maneuvers without any benefit with his dizziness. The patient is on meclizine, and he does believe that his helped some. The patient denies any ringing in the ears or ear pain. The patient denies any double vision, slurred speech, or problems swallowing. The patient has had some headaches over the last one month, and he may have one or 2 headaches a week in the bitemporal regions. The patient does have chronic neck stiffness associated with prior cervical spine surgery, but there has been no increase in the neck stiffness. The degree of dizziness is about the same throughout the day. The patient denies any numbness or tingling sensations on the arms or legs or face. The patient denies any weakness, but he occasionally will have some episodes of nausea. Patient has had problems with constipation, and urinary frequency at night. The patient is sent to this office for further evaluation. The patient is also concerned about some problems with short-term memory issues that have developed over the last several months.  Past Medical History  Diagnosis Date  . HTN (hypertension)   . DJD (degenerative joint disease)   . Pneumonia   . Pneumonia with lung abscess   . Bronchiolitis   . Dizziness and giddiness 07/26/2012  . Tear of left rotator cuff   . Gastroesophageal reflux disease   . Bronchiectasis      Past Surgical History  Procedure Laterality Date  . Knee arthroscopy      left  . Back surgery      multiple, DJD, cervical fusion, plate removed  . Video bronchoscopy      Dr Edwyna Shell  . Right video-assisted thoracoscopic surgery,  12/05/2010  . Tonsillectomy  1965  . Appendectomy  1969  . Neck surgery  [id number], 2002  . Lung surgery  2012,     removed right middle lobe,due to birth defect, removed half of right bottom lobe due to abcess  . Rotator cuff repair      no surgery, because of lung infection  . Carpal tunnel release Right 1999    Family History  Problem Relation Age of Onset  . Stroke Mother   . Alzheimer's disease Mother   . Transient ischemic attack Mother   . Chorea Mother   . Liver disease Father   . Diabetes Father   . Congestive Heart Failure Father   . Other Other  sibling with possible right middle lobe issues  . Ataxia Brother   . Ataxia Maternal Aunt     Social history:  reports that he has never smoked. He has never used smokeless tobacco. He reports that he drinks alcohol. He reports that he does not use illicit drugs.  Medications:  Current Outpatient Prescriptions on File Prior to Visit  Medication Sig Dispense Refill  . albuterol (PROAIR HFA) 108 (90 BASE) MCG/ACT inhaler Inhale 2 puffs into the lungs every 4 (four) hours as needed.       [name] HYDROcodone-acetaminophen (NORCO/VICODIN) 5-325 MG per tablet Take 1 tablet by mouth every 8 (eight) hours as needed.      [name] HYDROcodone-homatropine (HYCODAN) 5-1.5 MG/5ML syrup Take 5 mLs by mouth every 6 (six) hours as needed for cough.  240 mL  0  . meloxicam (MOBIC) 15 MG tablet Take 15 mg by mouth daily as needed.       . mometasone-formoterol (DULERA) 200-5 MCG/ACT AERO Take 2 puffs first thing in am and then another 2 puffs about 12 hours later.  3 Inhaler  1  . moxifloxacin (AVELOX) 400 MG tablet Take 400 mg by mouth daily.      . nortriptyline (PAMELOR) 10 MG capsule TAKE ONE CAPSULE AT NIGHT  FOR ONE WEEK, THEN TAKE 2 CAPSULES AT NIGHT  60 capsule  3  . omeprazole (PRILOSEC) 20 MG capsule Take 20 mg by mouth daily.       . Probiotic Product (PROBIOTIC DAILY PO) Take by mouth daily.      [name] Respiratory Therapy Supplies (FLUTTER) DEVI Use several times daily as needed for congestion/thick mucus      . sodium chloride HYPERTONIC 3 % nebulizer solution Take by nebulization as needed for other. 4 times daily      . sulfamethoxazole-trimethoprim (BACTRIM DS) 800-160 MG per tablet Take 1 tablet by mouth 2 (two) times daily.      [name] testosterone cypionate (DEPOTESTOTERONE CYPIONATE) 200 MG/ML injection Inject into the muscle every 14 (fourteen) days.      [name] tobramycin, PF, ([name]) 300 MG/5ML nebulizer solution Take 300 mg by nebulization 2 (two) times daily.       No current facility-administered medications on file prior to visit.    Allergies:  Allergies  Allergen Reactions  . Morphine And Related Nausea And Vomiting  . Tobramycin Other (See Comments)    ROS:  Out of a complete 14 system review of symptoms, the patient complains only of the following symptoms, and all other reviewed systems are negative. Excessive daytime sleepiness .fatigue . Hypoxemia, Epworth 13  , FSS 55  Points,  Vertigo vestibulitis, gentamycin and tobramycin induced.   Fevers, chills, fatigue shortness of breath, cough, wheezing, snoring Constipation Urinary frequency Moles Feeling hot, cold Frequent lung infections Memory loss, confusion, headache, generalized weakness, dizziness Not enough sleep, decreased energy, snoring  Blood pressure 142/81, pulse 103, resp. rate 17, height 5' 9.5" (1.765 m), weight 203 lb (92.08 kg).  Blood pressure standing, right arm is 124/68. Blood pressure sitting, right arm, is 120/68.  Physical Exam  General: The patient is alert and cooperative at the time of the examination.  Head: Pupils are equal, round, and reactive to light. Discs are flat bilaterally.  Neck:  The neck is supple, no carotid bruits are noted. Tachycarda, distended neck veins.  Respiratory:  wheex zing  And rales , chronic .decreased expansion .  Cardiovascular: The cardiovascular examination reveals a regular tachycardia , sinus - no  obvious murmurs or rubs are noted.  Skin: Extremities are without significant edema.  Neurologic Exam  Mental status: alert and fully oriented, fatigued.   Cranial nerves: Facial symmetry is present. There is good sensation of the face to pinprick and soft touch bilaterally. The strength of the facial muscles and the muscles to head turning and shoulder shrug are normal bilaterally. Speech is well chronic dysphonia , no papilledema, no cataract noted.  Extraocular movements are full- nystagmus with posterior vestibulum- forward and rotation movements produce 5 beat horizontal   Nystagmus and Vertigo. Fall tendency propulsive.Jill Alexanders fields are full.  Neck is restricted in ROM and shoulder shrug is weak. Status post fusion.   Motor: The motor testing reveals 5 /5 strength of all 4 extremities. Good symmetric motor tone is noted throughout.  Sensory: Sensory testing is intact to pinprick, soft touch, vibration sensation, and position sense on all 4 extremities. No evidence of extinction is noted.  Coordination: Romberg is positive.  Gait and station: Gait is very  wide-based. Tandem gait is ataxic, unsteady. Romberg is positive - No drift is seen.  Reflexes: Deep tendon reflexes are symmetric and normal bilaterally with the exception that the ankle jerk reflexes are depressed bilaterally. Toes are downgoing bilaterally.   Assessment/Plan:   1) patient with a long history of bronchiectasis, interstitial and parenchymal lung infections, pneumonia and pneumonitis, vital capacity restriction pulse right lung lobectomy. This along could account for the patient's tachycardia increased fatigue and sleepiness however the patient also is at significant risk of  retaining CO2 given the above named conditions.  2)We will check this patient for obstructive versus central sleep apnea -my main concern however is nocturnal hypoxemia and CO2 retention.   Should the patient not quantified by AHI of 10 for a CPAP titration given his multiple comorbidities, I will ask the technician to apply oxygen and to measure a response to titration and form of tachycardia and tachypnea.  It would also be important to see if the patient reports a more restorative sleep with oxygen applied and tested  at night.   In addition  CO2 needs to be measured this is very important given the pulmonary disease history of this patient,  as well as his narcotic pain medications and the changes and respiratory drive but this can induce, central apneas may be induced.[facility] [address] [place], [place] [address]  Phone [phone] Fax [phone]

## 2013-02-21 ENCOUNTER — Ambulatory Visit: Payer: 59 | Attending: Family Medicine | Admitting: Rehabilitative and Restorative Service Providers"

## 2013-02-21 DIAGNOSIS — IMO0001 Reserved for inherently not codable concepts without codable children: Secondary | ICD-10-CM | POA: Insufficient documentation

## 2013-02-21 DIAGNOSIS — R269 Unspecified abnormalities of gait and mobility: Secondary | ICD-10-CM | POA: Insufficient documentation

## 2013-02-21 DIAGNOSIS — R42 Dizziness and giddiness: Secondary | ICD-10-CM | POA: Insufficient documentation

## 2013-02-24 ENCOUNTER — Ambulatory Visit: Payer: 59 | Admitting: Rehabilitative and Restorative Service Providers"

## 2013-02-27 ENCOUNTER — Ambulatory Visit: Payer: 59 | Admitting: Rehabilitative and Restorative Service Providers"

## 2013-03-03 ENCOUNTER — Ambulatory Visit: Payer: 59 | Admitting: Rehabilitative and Restorative Service Providers"

## 2013-03-06 ENCOUNTER — Ambulatory Visit: Payer: 59 | Admitting: Physical Therapy

## 2013-03-08 ENCOUNTER — Other Ambulatory Visit: Payer: Self-pay | Admitting: Family Medicine

## 2013-03-08 DIAGNOSIS — R1011 Right upper quadrant pain: Secondary | ICD-10-CM

## 2013-03-10 ENCOUNTER — Ambulatory Visit: Payer: 59 | Admitting: Rehabilitative and Restorative Service Providers"

## 2013-03-13 ENCOUNTER — Encounter: Payer: Self-pay | Admitting: Rehabilitative and Restorative Service Providers"

## 2013-03-14 ENCOUNTER — Ambulatory Visit
Admission: RE | Admit: 2013-03-14 | Discharge: 2013-03-14 | Disposition: A | Discharge status: Home / Self Care | Payer: 59 | Source: Ambulatory Visit | Attending: Family Medicine | Admitting: Family Medicine

## 2013-03-14 ENCOUNTER — Institutional Professional Consult (permissible substitution): Payer: Self-pay | Admitting: Pulmonary Disease

## 2013-03-14 ENCOUNTER — Ambulatory Visit (INDEPENDENT_AMBULATORY_CARE_PROVIDER_SITE_OTHER): Payer: 59

## 2013-03-14 DIAGNOSIS — G4733 Obstructive sleep apnea (adult) (pediatric): Secondary | ICD-10-CM

## 2013-03-14 DIAGNOSIS — G4710 Hypersomnia, unspecified: Secondary | ICD-10-CM

## 2013-03-14 DIAGNOSIS — R1011 Right upper quadrant pain: Secondary | ICD-10-CM

## 2013-03-14 DIAGNOSIS — J151 Pneumonia due to Pseudomonas: Secondary | ICD-10-CM

## 2013-03-15 ENCOUNTER — Encounter: Payer: Self-pay | Admitting: Rehabilitative and Restorative Service Providers"

## 2013-03-21 ENCOUNTER — Telehealth: Payer: Self-pay | Admitting: Neurology

## 2013-03-21 ENCOUNTER — Encounter: Payer: Self-pay | Admitting: *Deleted

## 2013-03-21 DIAGNOSIS — G4733 Obstructive sleep apnea (adult) (pediatric): Secondary | ICD-10-CM

## 2013-03-21 NOTE — Telephone Encounter (Signed)
I called and spoke with the patient about his recent sleep study results. I informed the patient that his recent sleep study revealed severe obstructive sleep apnea and that Dr. Vickey Huger recommends CPAP therapy at home. I will mail a copy of the report to the patient along with a follow up letter. I will fax a copy to Dr. Marya Amsler office.

## 2013-03-28 ENCOUNTER — Ambulatory Visit: Payer: 59 | Attending: Family Medicine | Admitting: Rehabilitative and Restorative Service Providers"

## 2013-03-28 DIAGNOSIS — IMO0001 Reserved for inherently not codable concepts without codable children: Secondary | ICD-10-CM | POA: Insufficient documentation

## 2013-03-28 DIAGNOSIS — R269 Unspecified abnormalities of gait and mobility: Secondary | ICD-10-CM | POA: Insufficient documentation

## 2013-03-28 DIAGNOSIS — R42 Dizziness and giddiness: Secondary | ICD-10-CM | POA: Insufficient documentation

## 2013-03-30 ENCOUNTER — Other Ambulatory Visit: Payer: Self-pay | Admitting: Neurology

## 2013-04-04 ENCOUNTER — Encounter: Payer: Self-pay | Admitting: Rehabilitative and Restorative Service Providers"

## 2013-04-07 IMAGING — CR DG CHEST 2V
2 series · 2 of 2 positions shown · non-contrast
Comparison: 10/27/2010.

CLINICAL DATA: Cough.  Pneumonia.

CHEST - 2 VIEW

[w chest pa]
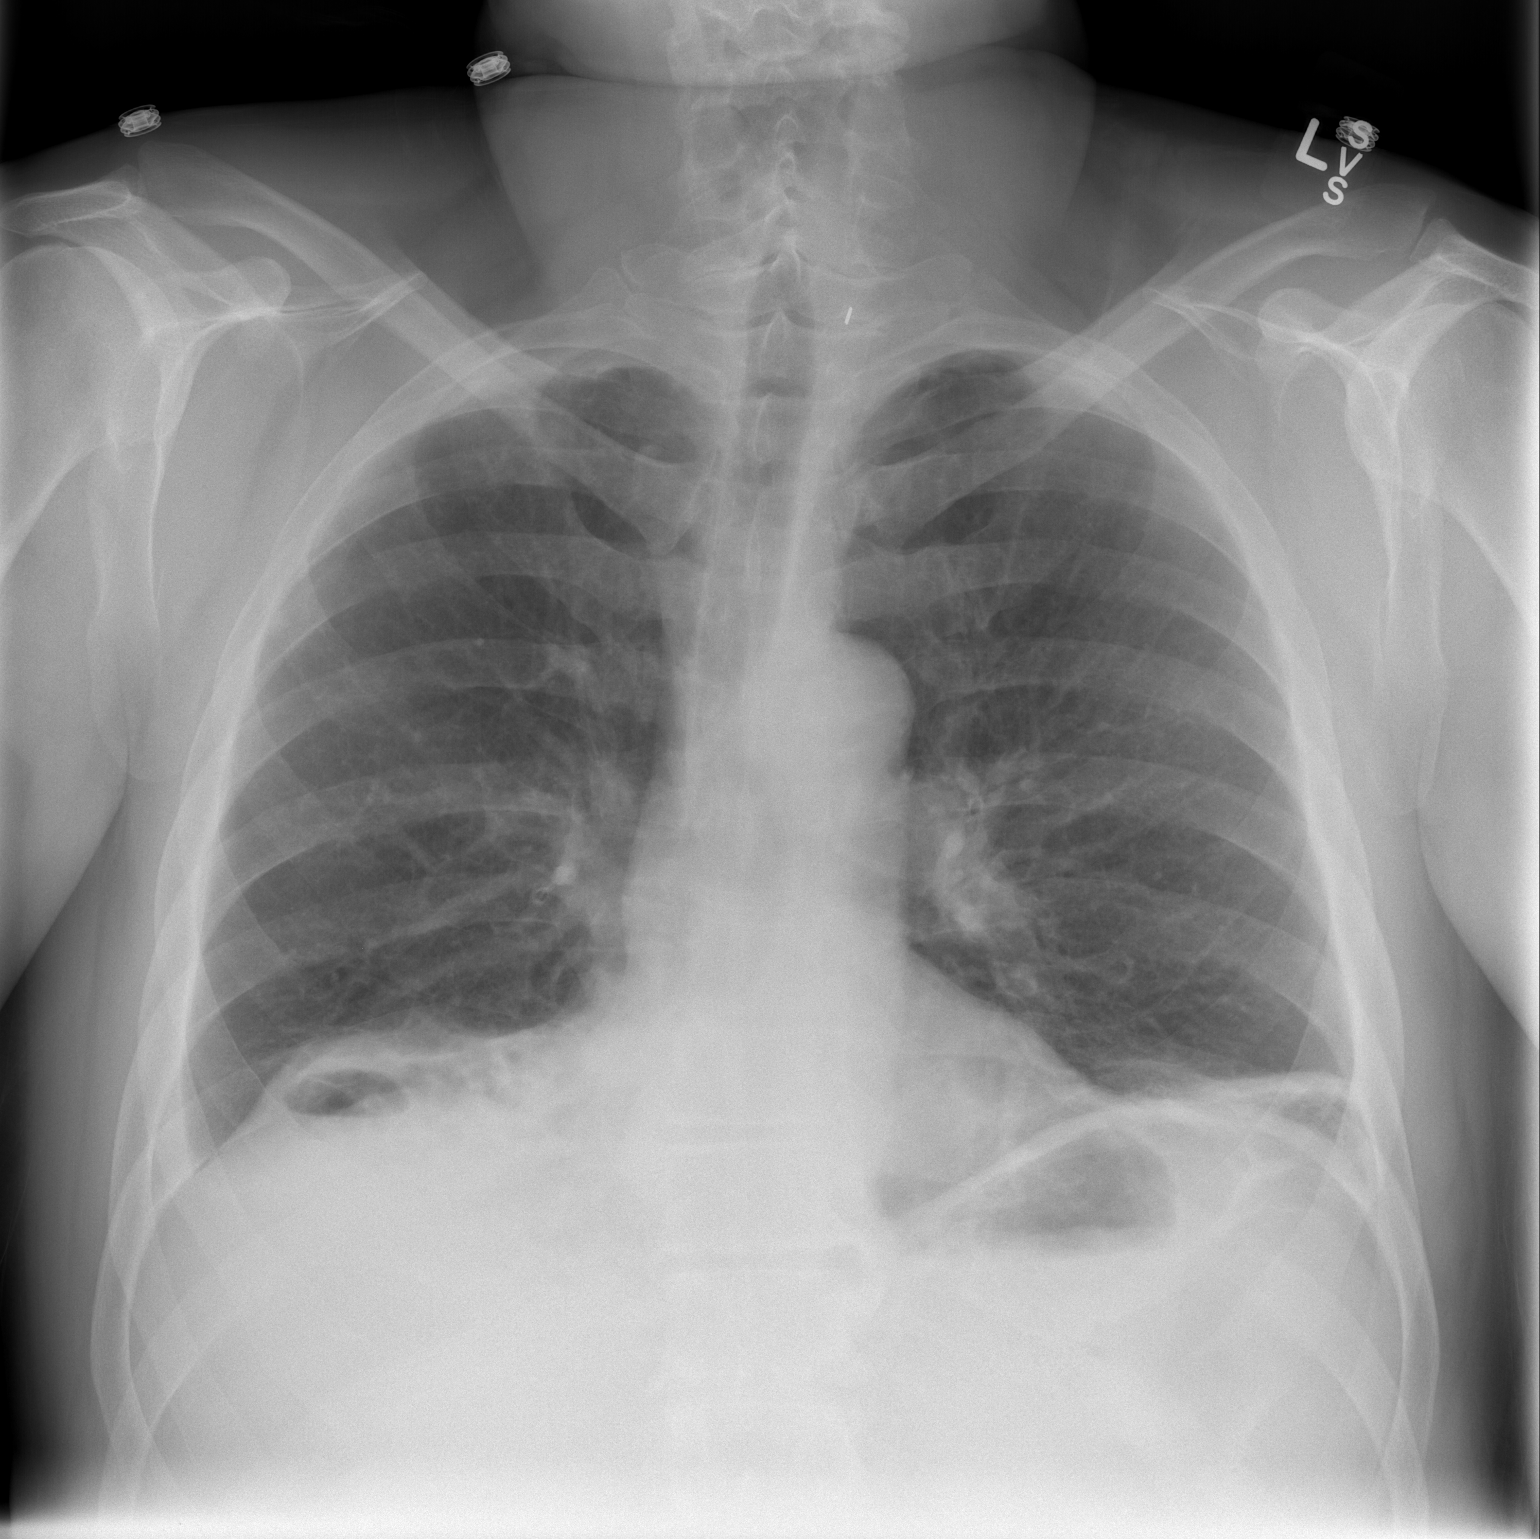

[w chest lat]
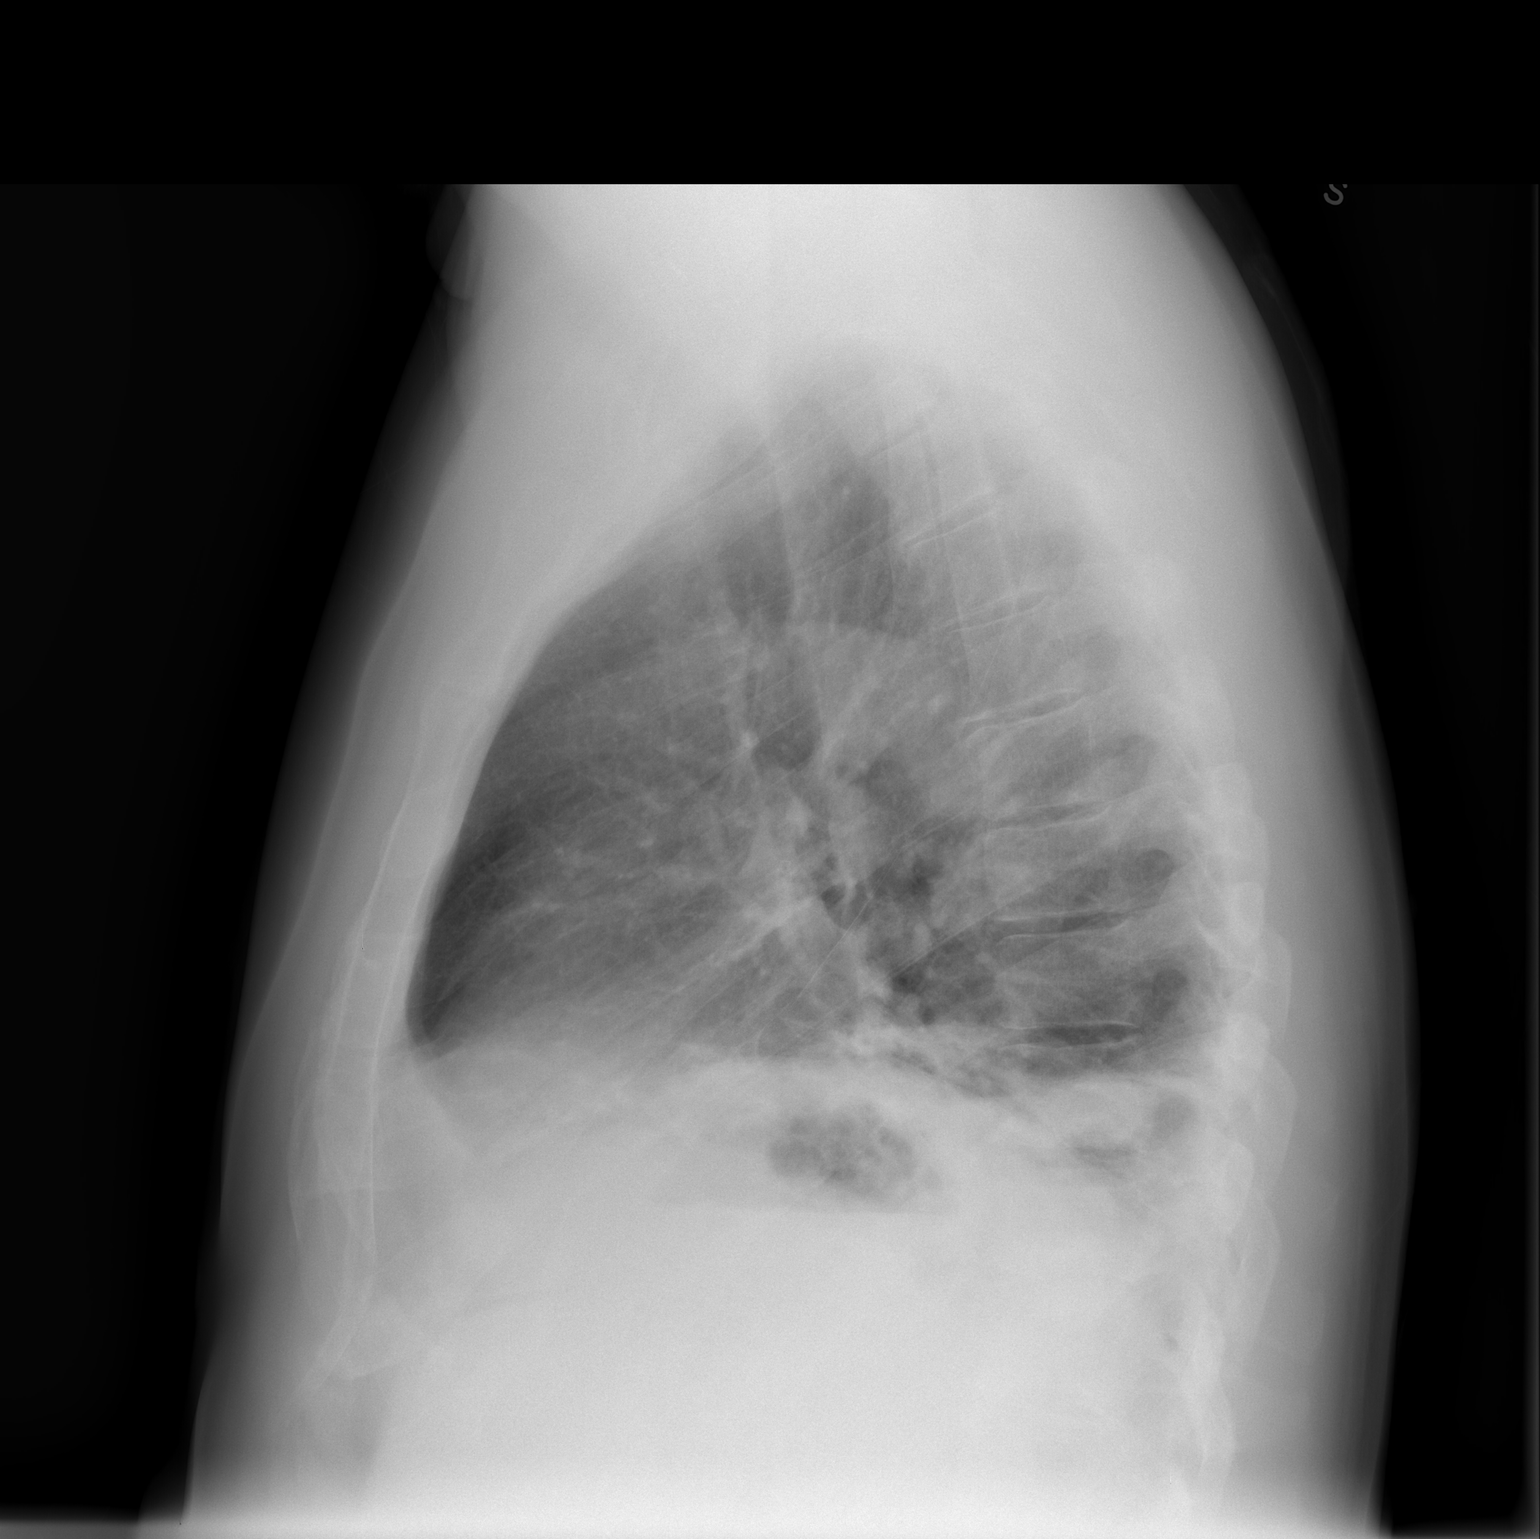

[2 of 2 positions shown; findings below may reference images not displayed]

FINDINGS: Improved right lower lobe airspace disease with
persistent subsegmental bilateral lower lobe atelectasis identified
on the lateral view. Small right pleural effusion.  The previously
seen right lower lobe pulmonary abscess is again identified, with
air fluid level. Emphysema is present with bilateral pleural apical
scarring.  Cardiopericardial silhouette unchanged.
IMPRESSION: 1.  Improving aeration at the right lung base.
2.  Persistent cavitary right lower lobe lesion with air fluid
level consistent with pulmonary abscess. Unchanged small
3.  Unchanged small right pleural effusion.

## 2013-04-11 ENCOUNTER — Ambulatory Visit: Payer: 59 | Admitting: Rehabilitative and Restorative Service Providers"

## 2013-04-14 ENCOUNTER — Other Ambulatory Visit: Payer: Self-pay | Admitting: Family Medicine

## 2013-04-14 DIAGNOSIS — R109 Unspecified abdominal pain: Secondary | ICD-10-CM

## 2013-04-20 ENCOUNTER — Ambulatory Visit
Admission: RE | Admit: 2013-04-20 | Discharge: 2013-04-20 | Disposition: A | Discharge status: Home / Self Care | Payer: 59 | Source: Ambulatory Visit | Attending: Family Medicine | Admitting: Family Medicine

## 2013-04-20 DIAGNOSIS — R109 Unspecified abdominal pain: Secondary | ICD-10-CM

## 2013-04-20 MED ORDER — IOHEXOL 300 MG/ML  SOLN
125.0000 mL | Freq: Once | INTRAMUSCULAR | Status: AC | PRN
  Administered 2013-04-20: 125 mL via INTRAVENOUS

## 2013-04-26 ENCOUNTER — Telehealth: Payer: Self-pay | Admitting: *Deleted

## 2013-04-26 DIAGNOSIS — Z0271 Encounter for disability determination: Secondary | ICD-10-CM

## 2013-04-26 NOTE — Telephone Encounter (Signed)
Patient explains he is having gas and abdominal distention which sometimes is painful and will awaken him from sleep during the night.  His CPAP pressure has been reduced somewhat to allow for this issue which we were aware of right after setup.  However the problem continues.  We discussed that he was advised to stay off his back and turn to his side placing pillows behind him.  The issue with that is this. 1.  He has a lung condition and has been advised to stay on one side that will allow for his lung to drain properly (right side I believe) but he isn't able to do that all night because... 2.  He has a torn rotator cuff on the opposite side which he can't have surgery on because of the lung issue, this causes pain and he isn't able to sleep on that side for long.  I advised he could use Simethicone at bedtime and also in the morning.  We also discussed using pillows to prop himself upright while on his back if needed.  He also mentions he has a chronic issue with nasal "stopping up" which has made usage quite difficult.  He will come in tomorrow and meet with me in the sleep lab for a mask management and fitting session to help [place] determine his needs a little better.  Perhaps there is a mask he can use when nasal congestion is an issue.  He is coming in at 10:00 AM

## 2013-04-27 ENCOUNTER — Ambulatory Visit (INDEPENDENT_AMBULATORY_CARE_PROVIDER_SITE_OTHER): Payer: Self-pay | Admitting: *Deleted

## 2013-04-27 DIAGNOSIS — G4733 Obstructive sleep apnea (adult) (pediatric): Secondary | ICD-10-CM

## 2013-04-27 DIAGNOSIS — Z0289 Encounter for other administrative examinations: Secondary | ICD-10-CM

## 2013-04-27 DIAGNOSIS — F458 Other somatoform disorders: Secondary | ICD-10-CM

## 2013-04-27 NOTE — Sleep Study (Signed)
Patient arrives today and we discuss his CPAP issues and difficulties he is experiencing...  1.  Aerophagia, this it the number one reason he is having difficulty with CPAP and it is quite severe often waking him during the night with pain and discomfort.  He has actually had his prescribed pressure reduced from 11 to 10 because of this and he is using Auto-CPAP.  We discussed BiPAP therapy as the next step if none of the suggestions below make any difference.  I advised the following and gave him information to take him:  Using your CPAP machine's ramp button  Sleeping at an inclined position  Avoiding food and beverages late in the day   No eating less than 2 hours before bedtime  Use of an acid reducer (already uses one)  Keep Gas-X tablets in your nightstand for those nights when you wake up with a problem.  It does seem to help bring those bubbles up.  You need to sit up take a   Gas-X and stay upright for a few minutes until the air comes up.[name]  Keep Pepto-Bismol in your nightstand for final back-up i fyou continue to be uncomfortable.  But read the instructions carefully about how LONG you can take them.  2.  Positioning issues: patient has trouble sleeping on both sides due to rotator cuff injury and lung issues which require positional drainage.  We discussed that his most preferred and comfortable position is sleeping on his front.  I fit him for a ResMed P10 mask size small (medium pillow also given) which will possibly allow him to sleep on his front.  Sleeping in this position may reduce aerophagia for him.  PATIENT IS CURRENTLY USING RESMED F10 MEDIUM FFM DUE TO ORAL BREATHING AND NASAL CONGESTION, it is sealing very well but it also makes positioning somewhat difficult for him.    3.  Nasal Congestion:  Pt does have a slightly deviated septum diagnosed 2007 and sees Dr. Claiborne Rigg ENT.  He has used steroid nasal spray in the past but only short term.  He awakens at times breathing through  his mouth with inability to breathe well through his nose, hence the FFM.  He is aware his current FFM is sealing well (per download).  We discussed humidity, nasal spray, moisturization, etc. As potential helps.  He will not be able to have surgery because of the lung history he is currently battling, same reason he can't have his rotator cuff repaired.  Patient reports he has noticed no difference since using CPAP on his energy levels.  He expresses that he is currently trying to stay out of the hospital as he is being treated for a chronic lung infection.  He is using the machine despite the challenges they present to him.   Download for 13 days reveals 4-10 APAP with EPR 2, median pressure 8.8 cm, 95th percentile pressure 9.9 cm H2O, residual AHI 3.1, max leak 2.5, 95th percentil leak 0.5, average daily usage 6 hours 52 minutes.  This has been scanned into Epic.

## 2013-05-08 ENCOUNTER — Encounter: Payer: Self-pay | Admitting: Neurology

## 2013-05-09 ENCOUNTER — Ambulatory Visit (INDEPENDENT_AMBULATORY_CARE_PROVIDER_SITE_OTHER): Payer: 59 | Admitting: Surgery

## 2013-05-17 ENCOUNTER — Encounter (INDEPENDENT_AMBULATORY_CARE_PROVIDER_SITE_OTHER): Payer: Self-pay

## 2013-05-17 ENCOUNTER — Ambulatory Visit (INDEPENDENT_AMBULATORY_CARE_PROVIDER_SITE_OTHER): Payer: 59 | Admitting: Neurology

## 2013-05-17 ENCOUNTER — Encounter: Payer: Self-pay | Admitting: Neurology

## 2013-05-17 VITALS — BP 137/85 | HR 81 | Resp 18 | Ht 70.0 in | Wt 205.0 lb

## 2013-05-17 DIAGNOSIS — R0902 Hypoxemia: Secondary | ICD-10-CM

## 2013-05-17 DIAGNOSIS — Z9989 Dependence on other enabling machines and devices: Secondary | ICD-10-CM

## 2013-05-17 DIAGNOSIS — J449 Chronic obstructive pulmonary disease, unspecified: Secondary | ICD-10-CM

## 2013-05-17 DIAGNOSIS — G4733 Obstructive sleep apnea (adult) (pediatric): Secondary | ICD-10-CM

## 2013-05-17 DIAGNOSIS — J439 Emphysema, unspecified: Secondary | ICD-10-CM

## 2013-05-17 DIAGNOSIS — J4489 Other specified chronic obstructive pulmonary disease: Secondary | ICD-10-CM

## 2013-05-17 DIAGNOSIS — J984 Other disorders of lung: Secondary | ICD-10-CM

## 2013-05-17 NOTE — Progress Notes (Signed)
[facility]  Provider:  Larey Seat, M D  Referring Provider: Gavin Pound, MD Primary Care Physician:  Gavin Pound, MD  Chief Complaint  Patient presents with  . Follow-up    Room 10  . Hypersomnia with sleep apnea    HPI:  [name] is a 58 y.o. male  Is seen here as a referral/ revisit  from Dr. Orland Penman . The patient sees Dr. Jannifer Franklin as primary neurologist.   Interval history:   [name] underwent a split night polysomnography study on 03-14-13 at the time he endorsed the Epworth Sleepiness Scale at 17/24 points in the depression scale at 8 points. His neck circumference is 17 inches and his BMI 29.5. The patient had an AHI of 33.5 and RDI of 42 there was a strong supine complement was milligram sleep observed in the diagnostic part of the study. Oxygen nadir was 80% of saturation is 13.5 minutes of desaturation time in total the patient was titrated to 11 cm water which eliminated almost all apneas, but made the pressure harder for the patient to tolerate. He used a nasal mask standard size in the night of the study. By the book up in the sleep lab feeling nothing  unusual , he noticed that the first night he used the machine at home  great discomfort-  he actually had a lot of head pain,  sinus pain and he had swallowed air.  He has a slightly deviated septum and a tendency to have nasal congestion- on top of his pulmonary history.   He switched to a full skin medium-size to control the all air leaks and nasal congestion. It makes it however harder for him to sleep on the side.  He still has aerophagia  but has tried to use Gas-X tablets to help with the acid  and he has been sleeping in a slightly inclined position.  Today's fatigue severity score was again  63 points ,the Epworth sleepiness or at 21 points - higher than  in his initial visit.  I was able to review a download for the machine the patient has been prescribed  Ultram titrator after he could not tolerate the initial 11 cm with setting this machinist at to allow a pressure window between 4 and 10 cm water he uses a machine 6 hours and 51 minutes at night on average he is 100% compliance for the last 30 days , the residual AHI is 4.6,  the 95% pressure is 9 cm higher.  Reason for visit: continued hypersomnia, fatigue.    Last visit ( consult note )  [name] has a quite extensive history of different forms of normal nail pneumonitis and pulmonary abscess ease. He has been on a tobramycin inhalation and albuterol nebulizers and inhalers. He's also using Mucinex he is using Pepcid for acid reflux, testosterone supplement, daily Bactrim, Pamelor at night, other, Lerer, MOBIC, hydrocordone for cough suppression at night. This reports that he became increasingly daytime sleepy and excessively fatigued over the past year beginning in late autumn 2013. This is pulmonary disease has progressed that has not resolved at this time he has also experienced significant side effects from some of the antibiotic treatment. Tobramycin and gentamicin have induced vestibulitis. His [place] pulmonologist recommended a sleep study to evaluate if the patient has nocturnal hypoxemia and if his bronchiectasis may also have let to obstruction ,  compliance restriction within  the nature of his  lung disease. The patient has been chronically orthopnoic, and tachycardic, and has undergone a pulmonectomy of the middle lobe and lower lobe of the right lung - 2012.   The patient relies on for breathing treatments a day, throughout the day. The patient has his latest daily treatment between 10 and 11 PM above for an hour and often falls asleep while taking the breathing treatment. Sometimes he would get jittery or tremulous after the breathing treatment. Usually by midnight he will fall asleep in his bed, he will sleep propped up on 2 pillows supine position and used to have  bathroom breaks at night but does not longer. He reports taking his nocturnal pain medication of on 10 PM which helps him to sleep through the night and Dr. Jannifer Franklin also prescribed Pamelor which helps sleep at night-  this is the brand name for nortriptyline. He will rise between 6 and 7 in the morning averaging about 6 hours of sleep at night. In the morning he feels unrefreshed and un-restored. He drinks 2 cups of coffee, he will naps on and a off all day, has an irresistible urge to fall asleep and notices that he is functioning physically and cognitively . These naps may last several hours.   Back pain is not a problem at night and doesn't seem to make it harder for him to sleep through the night given the current medication regimen.  There are no reported vivid dreams acting out dreams or parasomnias activities, nor sleep walking or sleep talking reported.  He has been told that he is a snorer , and this has been going on for many years. However with his recent pulmonary disease progression there may be restricted air movements, especially  possible CO2 retention.  Given that the patient is also on narcotic pain medications there is a higher risk for central apnea now.       Past Medical History  Diagnosis Date  . HTN (hypertension)   . DJD (degenerative joint disease)   . Pneumonia   . Pneumonia with lung abscess   . Bronchiolitis   . Dizziness and giddiness 07/26/2012  . Tear of left rotator cuff   . Gastroesophageal reflux disease   . Bronchiectasis     Past Surgical History  Procedure Laterality Date  . Knee arthroscopy      left  . Back surgery      multiple, DJD, cervical fusion, plate removed  . Video bronchoscopy      Dr Arlyce Dice  . Right video-assisted thoracoscopic surgery,  12/05/2010  . Tonsillectomy  1965  . Appendectomy  1969  . Neck surgery  [id number], 2002  . Lung surgery  2012,     removed right middle lobe,due to birth defect, removed half of right bottom lobe  due to abcess  . Rotator cuff repair      no surgery, because of lung infection  . Carpal tunnel release Right 1999    Family History  Problem Relation Age of Onset  . Stroke Mother   . Alzheimer's disease Mother   . Transient ischemic attack Mother   . Chorea Mother   . Liver disease Father   . Diabetes Father   . Congestive Heart Failure Father   . Other Other     sibling with possible right middle lobe issues  . Ataxia Brother   . Ataxia Maternal Aunt     Social history:  reports that he has never smoked. He has never used smokeless tobacco.  He reports that he does not drink alcohol or use illicit drugs.  Medications:  Current Outpatient Prescriptions on File Prior to Visit  Medication Sig Dispense Refill  . albuterol (PROAIR HFA) 108 (90 BASE) MCG/ACT inhaler Inhale 2 puffs into the lungs every 4 (four) hours as needed.       [name] albuterol (PROVENTIL) (2.5 MG/3ML) 0.083% nebulizer solution Take 2.5 mg by nebulization as needed for wheezing or shortness of breath. Take 3 ml with each sodium chloride solution treatment and with each tobramycin inhalation solution treatment      . Famotidine 20 MG CHEW Chew by mouth. One tablet at bedtime nightly      . Fexofenadine HCl (MUCINEX ALLERGY PO) Take by mouth. DM, 60mg   Cough suppressant guaifensein, 1200mg , expetorant - take one every 12 hours for congestion as needed      . HYDROcodone-acetaminophen (NORCO/VICODIN) 5-325 MG per tablet Take 1 tablet by mouth every 8 (eight) hours as needed.      [name] HYDROcodone-homatropine (HYCODAN) 5-1.5 MG/5ML syrup Take 5 mLs by mouth every 6 (six) hours as needed for cough.  240 mL  0  . meloxicam (MOBIC) 15 MG tablet Take 15 mg by mouth daily as needed.       . mometasone-formoterol (DULERA) 200-5 MCG/ACT AERO Take 2 puffs first thing in am and then another 2 puffs about 12 hours later.  3 Inhaler  1  . nortriptyline (PAMELOR) 10 MG capsule TAKE ONE CAPSULE AT NIGHT FOR ONE WEEK, THEN TAKE 2 CAPSULES  AT NIGHT  60 capsule  3  . omeprazole (PRILOSEC) 20 MG capsule Take 20 mg by mouth daily.       . Probiotic Product (PROBIOTIC DAILY PO) Take by mouth daily.      [name] Respiratory Therapy Supplies (FLUTTER) DEVI Use several times daily as needed for congestion/thick mucus      . sodium chloride HYPERTONIC 3 % nebulizer solution Take by nebulization as needed for other. 4 times daily      . testosterone cypionate (DEPOTESTOTERONE CYPIONATE) 200 MG/ML injection Inject into the muscle every 14 (fourteen) days.      [name] tobramycin, PF, ([name]) 300 MG/5ML nebulizer solution Take 300 mg by nebulization 2 (two) times daily.       No current facility-administered medications on file prior to visit.    Allergies:  Allergies  Allergen Reactions  . Morphine Nausea And Vomiting  . Morphine And Related Nausea And Vomiting  . Tobramycin Other (See Comments) and Nausea And Vomiting    ototoxicity    ROS:  Out of a complete 14 system review of symptoms, the patient complains only of the following symptoms, and all other reviewed systems are negative. Excessive daytime sleepiness .fatigue . Hypoxemia, Epworth 13  , FSS 55  Points,  Vertigo vestibulitis, gentamycin and tobramycin induced.   Fevers, chills, fatigue shortness of breath, cough, wheezing, snoring Constipation Urinary frequency Moles Feeling hot, cold Frequent lung infections Memory loss, confusion, headache, generalized weakness, dizziness Not enough sleep, decreased energy, snoring  Blood pressure 137/85, pulse 81, resp. rate 18, height 5\' 10"  (1.778 m), weight 205 lb (92.987 kg).  Blood pressure standing, right arm is 124/68. Blood pressure sitting, right arm, is 120/68.  Physical Exam  General: The patient is alert and cooperative at the time of the examination.  Head: mallompati 3 , no palor, mild retrognathia .  Neck: The neck is supple, no carotid bruits are noted. Tachycarda, distended neck veins.  Respiratory: not  longer   wheezing , no rales ,  Patient has chronic decreased lung expansion .  Cardiovascular: The cardiovascular examination reveals a regular tachycardia , sinus - no obvious murmurs or rubs are noted.  Skin: Extremities are without significant edema.  Neurologic Exam  Mental status: alert and fully oriented, fatigued.   Cranial nerves:Pupils are equal, round, and reactive to light. Discs are flat bilaterally. Facial symmetry is present. There is good sensation of the face to pinprick and soft touch bilaterally. The strength of the facial muscles and the muscles to head turning and shoulder shrug are normal bilaterally. Speech is well chronic dysphonia , no papilledema, no cataract noted.  Extraocular movements are full- nystagmus with posterior vestibulum- forward and rotation movements produce 5 beat horizontal   Nystagmus and Vertigo. Fall tendency propulsive.Nicki [place] fields are full.  Neck is restricted in ROM and shoulder shrug is weak. Status post fusion.   Motor: The motor testing reveals 5 /5 strength of all 4 extremities. Good symmetric motor tone is noted throughout.  Sensory: Sensory testing is intact to pinprick, soft touch, vibration sensation, and position sense on all 4 extremities. No evidence of extinction is noted.  Coordination: Romberg is positive.  Gait and station: Gait is very  wide-based. Tandem gait is ataxic, unsteady. Romberg is positive - No drift is seen.  Reflexes: Deep tendon reflexes are symmetric and normal bilaterally with the exception that the ankle jerk reflexes are depressed bilaterally. Toes are downgoing bilaterally.   Assessment/Plan:   1) patient with a long history of bronchiectasis, interstitial and parenchymal lung infections, pneumonia and pneumonitis, vital capacity restriction pulse right lung lobectomy.  OSA was found, AHI 32 , and is now on auto-titration reduced to 4.2 /hr .   since the patient had trouble with air swallowing and multiple  titrator have been used in the 91st percentile pressure is at 9 cm water he is highly compliant with her machine 100% of the last 30 days. I am going to order a overnight pulse oximetry to be used in a night on CPAP to evaluate if hypoxemia is still present in spite of the use of the machine. This but that study suggested that the oxygen nadir did not rise with the use of CPAP. Patient will remain on a full face mask for comfort as he has a lot of nasal air flow restriction.  [place], [facility] [address] [place], [place] [address]  Phone [phone] Fax [phone]

## 2013-05-17 NOTE — Patient Instructions (Signed)
Hypoxemia Hypoxemia occurs when your blood does not contain enough oxygen. The body cannot work well when it does not have enough oxygen, because every part of your body needs oxygen. Oxygen travels to all parts of the body through your blood. Hypoxemia can develop suddenly or can come on slowly. CAUSES Some common causes of hypoxemia include:  Long-term (chronic) lung diseases, such as chronic obstructive pulmonary disease (COPD) or interstitial lung disease.  Disorders that affect breathing at night, such as sleep apnea.  Fluid buildup in your lungs (pulmonary edema).  Lung infection (pneumonia).  Lung or throat cancer.  Abnormal blood flow that bypasses the lungs (shunt).  Certain diseasesthat affect nerves or muscles.  A collapsed lung (pneumothorax).  A blood clot in the lungs (pulmonary embolus).  Certain types of heart disease.  Slow or shallow breathing (hypoventilation).  Certain medicines.  High altitudes.  Toxic chemicals and gases. SIGNS AND SYMPTOMS Not everyone who has hypoxemia will develop symptoms. If the hypoxemia developed quickly, you will likely have symptoms such as shortness of breath. If the hypoxemia came on slowly over months or years, you may not notice any symptoms. Symptoms can include:  Shortness of breath (dyspnea).  Bluish color of the skin, lips, or nail beds.  Breathing that is fast, noisy, or shallow.  A fast heartbeat.  Feeling tired or sleepy.  Being confused or feeling anxious. DIAGNOSIS To determine if you have hypoxemia, your health care provider may perform:  A physical exam.  Blood tests.  A pulse oximetry. A sensor will be put on your finger, toe, or earlobe to measure the percent of oxygen in your blood. TREATMENT You will likely be treated with oxygen therapy. Depending on the cause of your hypoxemia, you may need oxygen for a short time (weeks or months), or you may need it indefinitely. Your health care provider  may also recommend other therapies to treat the underlying cause of your hypoxemia. HOME CARE INSTRUCTIONS  Only take over-the-counter or prescription medicines as directed by your health care provider.  Follow oxygen safety measures if you are on oxygen therapy. These may include:  Always having a backup supply of oxygen.  Not allowing anyone to smoke around oxygen.  Handling the oxygen tanks carefully and as instructed.  If you smoke, quit. Stay away from people who smoke.  Follow up with your health care provider as directed. SEEK MEDICAL CARE IF:  You have any concerns about your oxygen therapy.  You still have trouble breathing.  You become short of breath when you exercise.  You are tired when you wake up.  You have a headache when you wake up. SEEK IMMEDIATE MEDICAL CARE IF:   Your breathing gets worse.  You have new shortness of breath with normal activity.  You have a bluish color of the skin, lips, or nail beds.  You have confusion or cloudy thinking.  You cough up dark mucus.  You have chest pain.  You have a fever. MAKE SURE YOU:  Understand these instructions.  Will watch your condition.  Will get help right away if you are not doing well or get worse. Document Released: 09/22/2010 Document Revised: 11/09/2012 Document Reviewed: 10/06/2012 [facility] Patient Information 2014 [place], [place].

## 2013-06-01 ENCOUNTER — Encounter (INDEPENDENT_AMBULATORY_CARE_PROVIDER_SITE_OTHER): Payer: Self-pay | Admitting: Surgery

## 2013-06-02 ENCOUNTER — Ambulatory Visit (INDEPENDENT_AMBULATORY_CARE_PROVIDER_SITE_OTHER): Payer: 59 | Admitting: Surgery

## 2013-06-02 ENCOUNTER — Encounter (INDEPENDENT_AMBULATORY_CARE_PROVIDER_SITE_OTHER): Payer: Self-pay | Admitting: Surgery

## 2013-06-02 VITALS — BP 128/81 | HR 72 | Temp 97.8°F | Resp 18 | Ht 69.0 in | Wt 210.2 lb

## 2013-06-02 DIAGNOSIS — R071 Chest pain on breathing: Secondary | ICD-10-CM

## 2013-06-02 DIAGNOSIS — R0789 Other chest pain: Secondary | ICD-10-CM

## 2013-06-02 NOTE — Progress Notes (Signed)
Patient ID: [name], male   DOB: 1956/01/13, 58 y.o.   MRN: [id number]  Chief Complaint  Patient presents with  . Hernia    HPI [name] is a 58 y.o. male.  Referred by Dr. Wonda Horner for evaluation of bulging in the right upper quadrant HPI This is a 58 year old male who presents with intermittent bulging in the lower right chest and right upper quadrant of the abdomen. The patient states that after he bends over, occasionally he will have a visible protrusion in his right upper quadrant. This is quite uncomfortable. Often he will hear an audible pop and the bulge resolves and the pain improves. He has had a workup including an abdominal ultrasound which showed only some fatty liver. A CT scan was unremarkable.  The patient is status post right thoracotomy With resection of the right middle lobe and right lower lobe abscess. This was performed by Dr. Arlyce Dice who has subsequently retired.  Past Medical History  Diagnosis Date  . HTN (hypertension)   . DJD (degenerative joint disease)   . Pneumonia   . Pneumonia with lung abscess   . Bronchiolitis   . Dizziness and giddiness 07/26/2012  . Tear of left rotator cuff   . Gastroesophageal reflux disease   . Bronchiectasis   . OSA (obstructive sleep apnea)     Past Surgical History  Procedure Laterality Date  . Knee arthroscopy      left  . Back surgery      multiple, DJD, cervical fusion, plate removed  . Video bronchoscopy      Dr Arlyce Dice  . Right video-assisted thoracoscopic surgery,  12/05/2010  . Tonsillectomy  1965  . Appendectomy  1969  . Neck surgery  [id number], 2002  . Lung surgery  2012,     removed right middle lobe,due to birth defect, removed half of right bottom lobe due to abcess  . Rotator cuff repair      no surgery, because of lung infection  . Carpal tunnel release Right 1999    Family History  Problem Relation Age of Onset  . Stroke Mother   . Alzheimer's disease Mother   . Transient ischemic  attack Mother   . Chorea Mother   . Liver disease Father   . Diabetes Father   . Congestive Heart Failure Father   . Other Other     sibling with possible right middle lobe issues  . Ataxia Brother   . Ataxia Maternal Aunt     Social History History  Substance Use Topics  . Smoking status: Never Smoker   . Smokeless tobacco: Never Used  . Alcohol Use: No     Comment: quit in 1994    Allergies  Allergen Reactions  . Morphine Nausea And Vomiting  . Morphine And Related Nausea And Vomiting  . Tobramycin Other (See Comments) and Nausea And Vomiting    ototoxicity    Current Outpatient Prescriptions  Medication Sig Dispense Refill  . albuterol (PROAIR HFA) 108 (90 BASE) MCG/ACT inhaler Inhale 2 puffs into the lungs every 4 (four) hours as needed.       [name] albuterol (PROVENTIL) (2.5 MG/3ML) 0.083% nebulizer solution Take 2.5 mg by nebulization as needed for wheezing or shortness of breath. Take 3 ml with each sodium chloride solution treatment and with each tobramycin inhalation solution treatment      . Famotidine 20 MG CHEW Chew by mouth. One tablet at bedtime nightly      .  Fexofenadine HCl (MUCINEX ALLERGY PO) Take by mouth. DM, 60mg   Cough suppressant guaifensein, 1200mg , expetorant - take one every 12 hours for congestion as needed      . HYDROcodone-acetaminophen (NORCO/VICODIN) 5-325 MG per tablet Take 1 tablet by mouth every 8 (eight) hours as needed.      [name] HYDROcodone-homatropine (HYCODAN) 5-1.5 MG/5ML syrup Take 5 mLs by mouth every 6 (six) hours as needed for cough.  240 mL  0  . meloxicam (MOBIC) 15 MG tablet Take 15 mg by mouth daily as needed.       . mometasone-formoterol (DULERA) 200-5 MCG/ACT AERO Take 2 puffs first thing in am and then another 2 puffs about 12 hours later.  3 Inhaler  1  . nortriptyline (PAMELOR) 10 MG capsule TAKE ONE CAPSULE AT NIGHT FOR ONE WEEK, THEN TAKE 2 CAPSULES AT NIGHT  60 capsule  3  . omeprazole (PRILOSEC) 20 MG capsule Take 20 mg by  mouth daily.       . Probiotic Product (PROBIOTIC DAILY PO) Take by mouth daily.      [name] Respiratory Therapy Supplies (FLUTTER) DEVI Use several times daily as needed for congestion/thick mucus      . sodium chloride HYPERTONIC 3 % nebulizer solution Take by nebulization as needed for other. 4 times daily      . testosterone cypionate (DEPOTESTOTERONE CYPIONATE) 200 MG/ML injection Inject into the muscle every 14 (fourteen) days.      [name] tobramycin, PF, ([name]) 300 MG/5ML nebulizer solution Take 300 mg by nebulization 2 (two) times daily.       No current facility-administered medications for this visit.    Review of Systems Review of Systems  Constitutional: Negative for fever, chills and unexpected weight change.  HENT: Negative for congestion, hearing loss, sore throat, trouble swallowing and voice change.   Eyes: Negative for visual disturbance.  Respiratory: Negative for cough and wheezing.   Cardiovascular: Positive for chest pain (right anterior lower chest). Negative for palpitations and leg swelling.  Gastrointestinal: Positive for abdominal pain. Negative for nausea, vomiting, diarrhea, constipation, blood in stool, abdominal distention, anal bleeding and rectal pain.  Genitourinary: Negative for hematuria and difficulty urinating.  Musculoskeletal: Negative for arthralgias.  Skin: Negative for rash and wound.  Neurological: Negative for seizures, syncope, weakness and headaches.  Hematological: Negative for adenopathy. Does not bruise/bleed easily.  Psychiatric/Behavioral: Negative for confusion.    Blood pressure 128/81, pulse 72, temperature 97.8 F (36.6 C), temperature source Temporal, resp. rate 18, height 5\' 9"  (1.753 m), weight 210 lb 3.2 oz (95.346 kg).  Physical Exam Physical Exam WDWN in NAD HEENT:  EOMI, sclera anicteric Neck:  No masses, no thyromegaly Lungs:  CTA bilaterally; normal respiratory effort With the patient standing and relaxed, there is obvious  asymmetry of the costal margin.  The right costal margin protrudes more than the left.  Currently there is no tenderness CV:  Regular rate and rhythm; no murmurs Abd:  +bowel sounds, soft, non-tender, no masses; no sign of RUQ hernia Ext:  Well-perfused; no edema Skin:  Warm, dry; no sign of jaundice  Data Reviewed  CLINICAL DATA: Right upper quadrant pain, occasional intermittent  bulge  EXAM:  CT ABDOMEN AND PELVIS WITH CONTRAST  TECHNIQUE:  Multidetector CT imaging of the abdomen and pelvis was performed  using the standard protocol following bolus administration of  intravenous contrast.  CONTRAST: 185mL OMNIPAQUE IOHEXOL 300 MG/ML SOLN  COMPARISON: 01/16/2004  FINDINGS:  Note: The patient's intermittent palpable abnormality  was not  visualized prior to imaging, and therefore could not be marked.  After the scan, when the patient was in the upright position, the  technologist noted that the palpable abnormality did become visible.  Cystic bronchiectasis primarily involving the right middle and lower  lobe, chronic.  Liver, spleen, pancreas, and adrenal glands are within normal  limits.  Gallbladder is unremarkable. No intrahepatic or extrahepatic ductal  dilatation.  Kidneys are within normal limits. No hydronephrosis.  No evidence of bowel obstruction. Prior appendectomy.  No evidence of abdominal aortic aneurysm.  No abdominopelvic ascites.  No suspicious abdominopelvic lymphadenopathy.  Prostate is unremarkable.  Bladder is mildly thick-walled but underdistended.  Tiny fat containing bilateral inguinal hernias (series 2/image 94).  No CT abnormality is visualized in the subcutaneous tissues of the  right upper abdominal wall to correspond to the intermittent  palpable abnormality. Specifically, no upper abdominal wall hernia  is present at the time of imaging.  Mild degenerative changes of the visualized thoracolumbar spine.  IMPRESSION:  No CT abnormality is seen in  the right upper abdominal wall to  correspond to the intermittent palpable abnormality. Specifically,  no upper abdominal wall hernia is present at the time of imaging.  No CT findings to account for the patient's chronic right upper  quadrant abdominal pain.  Cystic bronchiectasis primarily involving the right middle and lower  lobe, chronic.  Electronically Signed  By: Julian Hy M.D.  On: 04/20/2013 10:17     Assessment    This does not represent a hernia. This appears to be residual effect from his thoracotomy. When the patient is bent over, it appears that the lower part of his right chest wall is partially displaced or dislocated and causes this visible protrusion. The audible sound is when the ribs or chest wall relocate.     Plan    The patient needs to be reevaluated by thoracic surgery for these chest wall issues.  No sign of hernia that needs to be repaired.   Follow-up PRN.        Montey Ebel K. 06/02/2013, 10:38 AM

## 2013-06-06 ENCOUNTER — Encounter: Payer: Self-pay | Admitting: Neurology

## 2013-06-07 ENCOUNTER — Encounter (INDEPENDENT_AMBULATORY_CARE_PROVIDER_SITE_OTHER): Payer: Self-pay

## 2013-06-13 ENCOUNTER — Other Ambulatory Visit (INDEPENDENT_AMBULATORY_CARE_PROVIDER_SITE_OTHER): Payer: Self-pay

## 2013-06-13 ENCOUNTER — Telehealth (INDEPENDENT_AMBULATORY_CARE_PROVIDER_SITE_OTHER): Payer: Self-pay

## 2013-06-13 DIAGNOSIS — Z9889 Other specified postprocedural states: Secondary | ICD-10-CM

## 2013-06-13 NOTE — Telephone Encounter (Signed)
Pt calling in stating he saw Dr Georgette Dover on 3/13 and they discussed him getting gin to see a thoracic surgeon. He never heard anything regarding an appointment so he called to make appointment himself. He was advised he needed to call our office to get the referral. He states he spoke to someone in our office last week asking for the referral. No documentation seen in chart where pt called. Asked Deneise Lever if she knew anything about pt referral. She never received the message but asked for me to put referral in chart. Apologized to pt for miscommunication and let him know I would give referral to our coordinator and have the appointment set up.

## 2013-06-14 ENCOUNTER — Telehealth: Payer: Self-pay | Admitting: *Deleted

## 2013-06-14 NOTE — Telephone Encounter (Signed)
Please review the patient's pulse oximetry that was done on 06/02/2013.  Patient would like to know what the results.  Please send to [place] after the results have been entered.

## 2013-06-19 NOTE — Telephone Encounter (Signed)
No need for 02 supplement. Less than 10 minutes of  desaturation on ROOM AIR>

## 2013-06-20 ENCOUNTER — Telehealth (INDEPENDENT_AMBULATORY_CARE_PROVIDER_SITE_OTHER): Payer: Self-pay | Admitting: *Deleted

## 2013-06-20 ENCOUNTER — Encounter: Payer: Self-pay | Admitting: Neurology

## 2013-06-20 ENCOUNTER — Other Ambulatory Visit (INDEPENDENT_AMBULATORY_CARE_PROVIDER_SITE_OTHER): Payer: Self-pay | Admitting: Surgery

## 2013-06-20 DIAGNOSIS — R0789 Other chest pain: Secondary | ICD-10-CM

## 2013-06-20 NOTE — Telephone Encounter (Signed)
I put the order in for the Chest CT. Please schedule and make sure they send Roxan Hockey the report.

## 2013-06-20 NOTE — Telephone Encounter (Signed)
Called pt to give him all his appt information for CT of chest and appt with Dr. Roxan Hockey.  Per pt, he is going into the hospital @ [facility] in the morning regarding a lung infection and will not be able to have his CT done for this Friday, 4.3.15.  I advised pt that when he gets out of the hospital to call me on my direct line and I will reschedule his CT and also reschedule the apt with Dr. Roxan Hockey.   Anderson Malta

## 2013-06-20 NOTE — Telephone Encounter (Signed)
I have pt set up to see Dr. Roxan Hockey at [facility].  Per Ebony Hail, pt needs a CT Chest prior to his appt which is 4.7.15.  Please advise.  Anderson Malta

## 2013-06-22 ENCOUNTER — Other Ambulatory Visit: Payer: Self-pay | Admitting: *Deleted

## 2013-06-22 DIAGNOSIS — G4733 Obstructive sleep apnea (adult) (pediatric): Secondary | ICD-10-CM

## 2013-06-22 NOTE — Progress Notes (Signed)
Pt with elevated AHI of 16.6, all apnea is obstructive - per Dr. Brett Fairy, please submit an order for change from 4-10 cmH2O to increase of 4-12 cmH2O - [facility]    Orders forwarded to [facility] 06/22/13

## 2013-06-23 ENCOUNTER — Other Ambulatory Visit: Payer: 59

## 2013-06-23 ENCOUNTER — Encounter: Payer: Self-pay | Admitting: Neurology

## 2013-06-27 ENCOUNTER — Encounter: Payer: 59 | Admitting: Thoracic Surgery (Cardiothoracic Vascular Surgery)

## 2013-07-06 ENCOUNTER — Telehealth: Payer: Self-pay | Admitting: Neurology

## 2013-07-06 DIAGNOSIS — R0902 Hypoxemia: Secondary | ICD-10-CM

## 2013-07-06 DIAGNOSIS — G4700 Insomnia, unspecified: Secondary | ICD-10-CM

## 2013-07-06 MED ORDER — SUVOREXANT 10 MG PO TABS: 10.0000 mg | ORAL_TABLET | Freq: Once | ORAL | Status: DC

## 2013-07-06 NOTE — Telephone Encounter (Signed)
Pt is calling wanting Dr. Brett Fairy to prescribe him a medication that would help him to sleep at night. Pt states he is really tired. Please advise

## 2013-07-06 NOTE — Telephone Encounter (Signed)
Due to his pulmonary condition( restrictive lung disease and hypoxemia at night ) we have to be careful with any medications that could be leading to respiratory supression. The safest for sleep induction in his medical condition is BELSOMRA>  Please call - I will have him pick up samples for 10 mg and 15 mg tabs. One at night po, 30 minutes prior to bedtime. [name]

## 2013-07-06 NOTE — Telephone Encounter (Signed)
Patient is extremely tired during the day and would like Dr. Brett Fairy to prescribe him something to sleep at night. Please call to advise.

## 2013-07-06 NOTE — Telephone Encounter (Signed)
Called pt to inform him per Dr. Brett Fairy that because of his pulmonary condition that he has to be careful with any medications that could lead to respiratory suppression and that the safest for sleep induction in his medical condition is Belsomra and that Dr. Brett Fairy was leaving samples at the front desk for the pt to pick up. I advised the pt per Dr. Brett Fairy to take 1-10 mg tablet 30 mins before bedtime and if that does not help to increase to 1-15 mg tablet and if he has any other problems, questions or concerns to call the office. Pt verbalized understanding.

## 2013-07-21 HISTORY — PX: LUNG SURGERY: SHX703

## 2013-07-31 ENCOUNTER — Other Ambulatory Visit: Payer: Self-pay | Admitting: Neurology

## 2013-08-03 ENCOUNTER — Encounter: Payer: Self-pay | Admitting: Neurology

## 2013-08-24 ENCOUNTER — Encounter: Payer: Self-pay | Admitting: Neurology

## 2013-08-29 ENCOUNTER — Other Ambulatory Visit: Payer: Self-pay | Admitting: Neurology

## 2013-10-01 ENCOUNTER — Other Ambulatory Visit: Payer: Self-pay | Admitting: Neurology

## 2013-10-09 ENCOUNTER — Other Ambulatory Visit: Payer: Self-pay | Admitting: Internal Medicine

## 2013-11-10 ENCOUNTER — Other Ambulatory Visit: Payer: Self-pay | Admitting: Neurology

## 2014-01-05 ENCOUNTER — Other Ambulatory Visit: Payer: Self-pay

## 2014-02-26 ENCOUNTER — Other Ambulatory Visit: Payer: Self-pay | Admitting: Dermatology

## 2014-03-02 ENCOUNTER — Ambulatory Visit: Payer: 59 | Attending: *Deleted | Admitting: Rehabilitative and Restorative Service Providers"

## 2014-03-02 ENCOUNTER — Encounter: Payer: Self-pay | Admitting: Rehabilitative and Restorative Service Providers"

## 2014-03-02 DIAGNOSIS — R269 Unspecified abnormalities of gait and mobility: Secondary | ICD-10-CM | POA: Diagnosis not present

## 2014-03-02 NOTE — Therapy (Deleted)
[facility] [address] [place], [place], [address] Phone: [phone]   Fax:  [phone]  Physical Therapy Evaluation  Patient Details  Name: [name] MRN: [id number] Date of Birth: 1955-04-05  Encounter Date: 03/02/2014    Past Medical History  Diagnosis Date  . HTN (hypertension)   . DJD (degenerative joint disease)   . Pneumonia   . Pneumonia with lung abscess   . Bronchiolitis   . Dizziness and giddiness 07/26/2012  . Tear of left rotator cuff   . Gastroesophageal reflux disease   . Bronchiectasis   . OSA (obstructive sleep apnea)     Past Surgical History  Procedure Laterality Date  . Knee arthroscopy      left  . Back surgery      multiple, DJD, cervical fusion, plate removed  . Video bronchoscopy      Dr Arlyce Dice  . Right video-assisted thoracoscopic surgery,  12/05/2010  . Tonsillectomy  1965  . Appendectomy  1969  . Neck surgery  [id number], 2002  . Lung surgery  2012,     removed right middle lobe,due to birth defect, removed half of right bottom lobe due to abcess  . Rotator cuff repair      no surgery, because of lung infection  . Carpal tunnel release Right 1999    There were no vitals taken for this visit.  Visit Diagnosis:  No diagnosis found.      Subjective Assessment - 03/02/14 0940    Symptoms The patient is a 58 yo male known to our clinic from prior physical therapy.  He was doing exercises from prior P.T. and noted general fatigue and falls in late afternoon and evening when fatigued.  The patient underwent lung lobe removal surgery at [facility] and was recovering well walking up to 6 miles/day.  He reports he began falling 2 months ago and is now being evaluated by a neurologist to determine if there are other factors contributing to falls.     Currently in Pain? Yes   Pain Score 3    Pain Location Back   Pain Descriptors / Indicators Aching   Pain Onset More than a month ago   Pain  Frequency Constant   Aggravating Factors  picking up heavier items   Pain Relieving Factors medications and walking          [facility] PT Assessment - 03/02/14 0001    Assessment   Medical Diagnosis --  abnormality of gait   Prior Therapy --  known to our clinic from 2 prior episodes of P.T.   Precautions   Precautions Fall   Balance Screen   Has the patient fallen in the past 6 months Yes   How many times? --  10 times in the past 2 months estimated   Has the patient had a decrease in activity level because of a fear of falling?  Yes   Is the patient reluctant to leave their home because of a fear of falling?  Yes   [place] Private residence   Living Arrangements Spouse/significant other   Type of [place] to enter   Entrance Stairs-Number of Steps --  2   Entrance Stairs-Rails Can reach both   [place] Two level;Able to live on main level with bedroom/bathroom   [place] - single point   Prior Function   Level of Independence Independent with basic ADLs  independent  with home tasks    Observation/Other Assessments   Focus on Therapeutic Outcomes (FOTO)  45%   Other Surveys  --  DHI=76%   Ambulation/Gait   Ambulation/Gait Yes   Ambulation/Gait Assistance 7: Independent   Ambulation Distance (Feet) --  200   Assistive device --  no device   Gait Pattern --  antalgic R LE with R knee remaining more extended than L   Gait velocity 2.61 ft/sec   Stairs Yes   Stairs Assistance 6: Modified independent (Device/Increase time)   Stair Management Technique One rail Right   Number of Stairs --  4   Standardized Balance Assessment   Standardized Balance Assessment Berg Balance Test;Dynamic Gait Index   Berg Balance Test   Sit to Stand Able to stand without using hands and stabilize independently   Standing Unsupported Able to stand safely 2 minutes   Sitting with Back Unsupported but Feet Supported on Floor  or Stool Able to sit safely and securely 2 minutes   Stand to Sit Sits safely with minimal use of hands   Transfers Able to transfer safely, minor use of hands   Standing Unsupported with Eyes Closed Able to stand 10 seconds safely   Standing Ubsupported with Feet Together Able to place feet together independently and stand 1 minute safely   From Standing, Reach Forward with Outstretched Arm Can reach confidently >25 cm (10")   From Standing Position, Pick up Object from Floor Able to pick up shoe safely and easily   From Standing Position, Turn to Look Behind Over each Shoulder Looks behind from both sides and weight shifts well   Turn 360 Degrees Able to turn 360 degrees safely but slowly   Standing Unsupported, Alternately Place Feet on Step/Stool Able to complete 4 steps without aid or supervision   Standing Unsupported, One Foot in Front Able to plae foot ahead of the other independently and hold 30 seconds   Standing on One Leg Tries to lift leg/unable to hold 3 seconds but remains standing independently   Total Score 48   Dynamic Gait Index   Level Surface Mild Impairment   Change in Gait Speed Mild Impairment   Gait with Horizontal Head Turns Moderate Impairment   Gait with Vertical Head Turns Moderate Impairment   Gait and Pivot Turn Mild Impairment   Step Over Obstacle Mild Impairment   Step Around Obstacles Normal   Steps Mild Impairment   Total Score 15     TREATMENT PROVIDED:  NEUROMUSCULAR RE-EDUCATION: Gaze x 1 provided for HEP per patient instruction handouts in EPIC.  Problem List Patient Active Problem List   Diagnosis Date Noted  . Right-sided chest wall pain 06/02/2013  . Hypersomnia with sleep apnea, unspecified 02/20/2013  . Pneumonia due to Pseudomonas 02/20/2013  . Dizziness and giddiness 07/26/2012  . Memory deficit 07/26/2012  . Pneumonia with lung abscess   . DJD (degenerative joint disease)   . HTN (hypertension)   . Lung abscess 11/06/2010  .  Bronchiectasis 11/06/2010    Garrell Flagg 03/02/2014, 10:12 AM

## 2014-03-02 NOTE — Patient Instructions (Signed)
Gaze Stabilization: Standing Feet Apart   Feet shoulder width apart, keeping eyes on target on wall __3-5__ feet away, tilt head down 15-30 and move head side to side for _30___ seconds. Repeat while moving head up and down for __30__ seconds. Do __2__ sessions per day. Repeat using target on pattern background.  Copyright  VHI. All rights reserved.  Gaze Stabilization: Tip Card 1.Target must remain in focus, not blurry, and appear stationary while head is in motion. 2.Perform exercises with small head movements (45 to either side of midline). 3.Increase speed of head motion so long as target is in focus. 4.If you wear eyeglasses, be sure you can see target through lens (therapist will give specific instructions for bifocal / progressive lenses). 5.These exercises may provoke dizziness or nausea. Work through these symptoms. If too dizzy, slow head movement slightly. Rest between each exercise. 6.Exercises demand concentration; avoid distractions. 7.For safety, perform standing exercises close to a counter, wall, corner, or next to someone.  Copyright  VHI. All rights reserved.

## 2014-03-02 NOTE — Therapy (Signed)
[facility] [address] [place], [place], [address] Phone: [phone]   Fax:  [phone]  Physical Therapy Evaluation  Patient Details  Name: [name] MRN: [id number] Date of Birth: 05/23/1955  Encounter Date: 03/02/2014      PT End of Session - 03/02/14 1221    Visit Number 1   Number of Visits 10   Date for PT Re-Evaluation 04/01/14   Authorization Type UHC   PT Start Time 0935   PT Stop Time 1020   PT Time Calculation (min) 45 min   Activity Tolerance Patient tolerated treatment well   Behavior During Therapy [facility] for tasks assessed/performed      Past Medical History  Diagnosis Date  . HTN (hypertension)   . DJD (degenerative joint disease)   . Pneumonia   . Pneumonia with lung abscess   . Bronchiolitis   . Dizziness and giddiness 07/26/2012  . Tear of left rotator cuff   . Gastroesophageal reflux disease   . Bronchiectasis   . OSA (obstructive sleep apnea)     Past Surgical History  Procedure Laterality Date  . Knee arthroscopy      left  . Back surgery      multiple, DJD, cervical fusion, plate removed  . Video bronchoscopy      Dr Arlyce Dice  . Right video-assisted thoracoscopic surgery,  12/05/2010  . Tonsillectomy  1965  . Appendectomy  1969  . Neck surgery  [id number], 2002  . Lung surgery  2012,     removed right middle lobe,due to birth defect, removed half of right bottom lobe due to abcess  . Rotator cuff repair      no surgery, because of lung infection  . Carpal tunnel release Right 1999    There were no vitals taken for this visit.  Visit Diagnosis:  Abnormality of gait      Subjective Assessment - 03/02/14 0940    Symptoms The patient is a 58 yo male known to our clinic from prior physical therapy.  He was doing exercises from prior P.T. and noted general fatigue and falls in late afternoon and evening when fatigued.  The patient underwent lung lobe removal surgery at [facility] and was  recovering well walking up to 6 miles/day.  He reports he began falling 2 months ago and is now being evaluated by a neurologist to determine if there are other factors contributing to falls.     Currently in Pain? Yes   Pain Score 3    Pain Location Back   Pain Descriptors / Indicators Aching   Pain Onset More than a month ago   Pain Frequency Constant   Aggravating Factors  picking up heavier items   Pain Relieving Factors medications and walking          [facility] PT Assessment - 03/02/14 0001    Assessment   Medical Diagnosis --  abnormality of gait   Prior Therapy --  known to our clinic from 2 prior episodes of P.T.   Precautions   Precautions Fall   Balance Screen   Has the patient fallen in the past 6 months Yes   How many times? --  10 times in the past 2 months estimated   Has the patient had a decrease in activity level because of a fear of falling?  Yes   Is the patient reluctant to leave their home because of a fear of falling?  Yes   Home Environment  Living Enviornment Private residence   Living Arrangements Spouse/significant other   Type of [place] to enter   Entrance Stairs-Number of Steps --  2   Entrance Stairs-Rails Can reach both   [place] Two level;Able to live on main level with bedroom/bathroom   [place] - single point   Prior Function   Level of Independence Independent with basic ADLs  independent with home tasks    Observation/Other Assessments   Focus on Therapeutic Outcomes (FOTO)  45%   Other Surveys  --  DHI=76%   Ambulation/Gait   Ambulation/Gait Yes   Ambulation/Gait Assistance 7: Independent   Ambulation Distance (Feet) --  200   Assistive device --  no device   Gait Pattern --  antalgic R LE with R knee remaining more extended than L   Gait velocity 2.61 ft/sec   Stairs Yes   Stairs Assistance 6: Modified independent (Device/Increase time)   Stair Management Technique One rail Right    Number of Stairs --  4   Standardized Balance Assessment   Standardized Balance Assessment Berg Balance Test;Dynamic Gait Index   Berg Balance Test   Sit to Stand Able to stand without using hands and stabilize independently   Standing Unsupported Able to stand safely 2 minutes   Sitting with Back Unsupported but Feet Supported on Floor or Stool Able to sit safely and securely 2 minutes   Stand to Sit Sits safely with minimal use of hands   Transfers Able to transfer safely, minor use of hands   Standing Unsupported with Eyes Closed Able to stand 10 seconds safely   Standing Ubsupported with Feet Together Able to place feet together independently and stand 1 minute safely   From Standing, Reach Forward with Outstretched Arm Can reach confidently >25 cm (10")   From Standing Position, Pick up Object from Floor Able to pick up shoe safely and easily   From Standing Position, Turn to Look Behind Over each Shoulder Looks behind from both sides and weight shifts well   Turn 360 Degrees Able to turn 360 degrees safely but slowly   Standing Unsupported, Alternately Place Feet on Step/Stool Able to complete 4 steps without aid or supervision   Standing Unsupported, One Foot in Front Able to plae foot ahead of the other independently and hold 30 seconds   Standing on One Leg Tries to lift leg/unable to hold 3 seconds but remains standing independently   Total Score 48   Dynamic Gait Index   Level Surface Mild Impairment   Change in Gait Speed Mild Impairment   Gait with Horizontal Head Turns Moderate Impairment   Gait with Vertical Head Turns Moderate Impairment   Gait and Pivot Turn Mild Impairment   Step Over Obstacle Mild Impairment   Step Around Obstacles Normal   Steps Mild Impairment   Total Score 15     NEUROMUSCULAR RE-EDUCATION: Gaze x 1 HEP provided.       PT Education - 03/02/14 1221    Education provided Yes   Education Details HEP: gaze x 1 in vertical and horizontal  planes   Person(s) Educated Patient   Methods Explanation;Demonstration;Handout;Verbal cues   Comprehension Verbalized understanding;Returned demonstration          PT Short Term Goals - 03/02/14 1718    PT SHORT TERM GOAL #1   Title The patient will be indep with HEP for gaze adaptation, home walking, and balance activities.  Target  date 04/01/2014   PT SHORT TERM GOAL #2   Title The patient will improve dynamic gait index to > or equal to 18/24 to demo decreasing risk for falls.  Target date 04/01/2014   PT SHORT TERM GOAL #3   Title The patient will improve gait speed to > or equal to 3.0 ft/sec demonstrating improved functional mobility.  Target date 04/01/2014          PT Long Term Goals - 03/02/14 1721    PT LONG TERM GOAL #1   Title The patient will be indep with HEP progression for d/c.  Target date 05/01/2014   PT LONG TERM GOAL #2   Title The patient will improve Berg to > or equal to 52/56 to demo improving balance.  Target date 05/01/2014   PT LONG TERM GOAL #3   Title The patient will imrpove DGI to > or equal to 22/24.  Target date 05/01/2014   PT LONG TERM GOAL #4   Title The patient will decrease DHI to < or equal to 50% to demo improved subjective report of dizziness.  Target date 05/01/2014          Plan - 03/02/14 1228    Clinical Impression Statement The patient will benefit from physical therapy services to address imbalance, decreased gaze stabilization from chronic h/o vestibular hypofunction.  The patient has multiple factors that may slow vestibular adaptation including use of pain medications, h/o chronic low back pain that may hinder higher level activities.  PT to address through activities in clinic and HEP review from prior episodes of P.T.   Pt will benefit from skilled therapeutic intervention in order to improve on the following deficits Abnormal gait;Decreased mobility;Cardiopulmonary status limiting activity;Decreased activity tolerance;Difficulty  walking;Decreased balance  PT will monitor pain, but no goal to follow due to chronic nature of pain and reason for referral.   Rehab Potential Good   PT Frequency 2x / week   PT Duration 2 weeks  f/b 1x/week for 6 weeks as needed   PT Treatment/Interventions Therapeutic activities;Neuromuscular re-education;Functional mobility training;Balance training;Patient/family education;Therapeutic exercise;Gait training   PT Next Visit Plan Review gaze x 1 viewing, high level balance, sensory organization testing, begin walking program considering pain and safety.   Consulted and Agree with Plan of Care Patient                              Problem List Patient Active Problem List   Diagnosis Date Noted  . Right-sided chest wall pain 06/02/2013  . Hypersomnia with sleep apnea, unspecified 02/20/2013  . Pneumonia due to Pseudomonas 02/20/2013  . Dizziness and giddiness 07/26/2012  . Memory deficit 07/26/2012  . Pneumonia with lung abscess   . DJD (degenerative joint disease)   . HTN (hypertension)   . Lung abscess 11/06/2010  . Bronchiectasis 11/06/2010    Arelie Kuzel 03/02/2014, 5:27 PM

## 2014-03-05 NOTE — Addendum Note (Signed)
Addended by: Rudell Cobb M on: 03/05/2014 10:01 AM   Modules accepted: Orders

## 2014-03-06 ENCOUNTER — Ambulatory Visit: Payer: 59

## 2014-03-06 DIAGNOSIS — R269 Unspecified abnormalities of gait and mobility: Secondary | ICD-10-CM | POA: Diagnosis not present

## 2014-03-06 NOTE — Therapy (Signed)
[facility] [address] [place], [place], [address] Phone: [phone]   Fax:  [phone]  Physical Therapy Treatment  Patient Details  Name: [name] MRN: [id number] Date of Birth: [birth year]  Encounter Date: 03/06/2014      PT End of Session - 03/06/14 1206    Visit Number 2   Number of Visits 10   Date for PT Re-Evaluation 04/01/14   Authorization Type UHC 40 visit limit   PT Start Time 1093   PT Stop Time 1150   PT Time Calculation (min) 45 min      Past Medical History  Diagnosis Date  . HTN (hypertension)   . DJD (degenerative joint disease)   . Pneumonia   . Pneumonia with lung abscess   . Bronchiolitis   . Dizziness and giddiness 07/26/2012  . Tear of left rotator cuff   . Gastroesophageal reflux disease   . Bronchiectasis   . OSA (obstructive sleep apnea)     Past Surgical History  Procedure Laterality Date  . Knee arthroscopy      left  . Back surgery      multiple, DJD, cervical fusion, plate removed  . Video bronchoscopy      Dr Arlyce Dice  . Right video-assisted thoracoscopic surgery,  12/05/2010  . Tonsillectomy  1965  . Appendectomy  1969  . Neck surgery  [id number], 2002  . Lung surgery  2012,     removed right middle lobe,due to birth defect, removed half of right bottom lobe due to abcess  . Rotator cuff repair      no surgery, because of lung infection  . Carpal tunnel release Right 1999    There were no vitals taken for this visit.  Visit Diagnosis:  Abnormality of gait      Subjective Assessment - 03/06/14 1214    Symptoms Pt had a fall on saturday while working on decorating his yard for christmas and twisted the left knee. Denies hitting head or loss of consciouness. Knee is beginning to feel better.     Denies pain today.      [facility] PT Assessment - 03/06/14 0001    Standardized Balance Assessment   Balance Master Testing Sensory Organization Test;Other/comments  Composite  54 (was 59 in January 2015), Vis & Vest impaired    Neuro re-ed: Completed sensory organization test.  Pt was taught, performed and was provided with typed HEP. See pt instructions for details.        PT Education - 03/06/14 1200    Education Details HEP see pt instructions   Person(s) Educated Patient   Methods Explanation;Demonstration;Handout;Verbal cues   Comprehension Verbalized understanding;Returned demonstration            PT Long Term Goals - 03/06/14 1205    PT LONG TERM GOAL #1   Title The patient will be indep with HEP progression for d/c.  Target date 05/01/2014   PT LONG TERM GOAL #2   Title The patient will improve Berg to > or equal to 52/56 to demo improving balance.  Target date 05/01/2014   PT LONG TERM GOAL #3   Title The patient will imrpove DGI to > or equal to 22/24.  Target date 05/01/2014   PT LONG TERM GOAL #4   Title The patient will decrease DHI to < or equal to 50% to demo improved subjective report of dizziness.  Target date 05/01/2014   PT LONG TERM GOAL #5   Title Increase  composite score on sensory organization test to 60 (this was pt's score in January 2015). Target date: 05/01/2014          Plan - 03/06/14 1207    Clinical Impression Statement Pt demonstrated decreased score on sensory organization test today compared to scored at D/C from therapy in January of this year. Pt is expected to meet therapy goals with continuede training and appears to be motivated and willing to perform HEP.    Rehab Potential Good   PT Next Visit Plan Add walking program to HEP; high level balance on compliant surface                                Problem List Patient Active Problem List   Diagnosis Date Noted  . Right-sided chest wall pain 06/02/2013  . Hypersomnia with sleep apnea, unspecified 02/20/2013  . Pneumonia due to Pseudomonas 02/20/2013  . Dizziness and giddiness 07/26/2012  . Memory deficit 07/26/2012  . Pneumonia with  lung abscess   . DJD (degenerative joint disease)   . HTN (hypertension)   . Lung abscess 11/06/2010  . Bronchiectasis 11/06/2010    Delrae Sawyers D 03/06/2014, 12:15 PM

## 2014-03-06 NOTE — Patient Instructions (Signed)
Feet Apart (Compliant Surface) Head Motion - Eyes Closed   Stand in corner with a chair in front of you to hold on if needed, on compliant surface: two stacked pillows with feet shoulder apart. Close eyes and move head slowly, up and down. Perform for 1 minute. Then, repeat with head movements left to right. Do this daily.  Copyright  VHI. All rights reserved.   Tandem Walking   Hold counter as needed. Walk with each foot directly in front of other, heel of one foot touching toes of other foot with each step. Both feet straight ahead, when you get to the end of the counter, walk backward in the same manner. Perform 3 laps per day. You can progress this by closing eyes.   Copyright  VHI. All rights reserved.   Tandem Stance   Right foot in front of left, heel touching toe both feet "straight ahead".  Balance in this position 60 seconds. Do with left foot in front of right. Perform daily.  Copyright  VHI. All rights reserved.  Gaze Stabilization: Standing Feet Together   Feet together, keeping eyes on target on wall 5 feet away, tilt head down 15-30 and move head side to side for 30 seconds. Repeat while moving head up and down for 30 seconds. Do 3 sessions per day.   When this becomes easy, Progress by standing with feet heel/toe; then progress again by standing on pillows with feet together or heel/toe  Copyright  VHI. All rights reserved.

## 2014-03-08 ENCOUNTER — Ambulatory Visit: Payer: 59

## 2014-03-08 DIAGNOSIS — R269 Unspecified abnormalities of gait and mobility: Secondary | ICD-10-CM

## 2014-03-08 DIAGNOSIS — M5441 Lumbago with sciatica, right side: Secondary | ICD-10-CM

## 2014-03-08 NOTE — Therapy (Signed)
[facility] [address] [place], [place], [address] Phone: [phone]   Fax:  [phone]  Physical Therapy Treatment  Patient Details  Name: [name] MRN: [id number] Date of Birth: April 13, 1955  Encounter Date: 03/08/2014      PT End of Session - 03/08/14 0855    Visit Number 3   Number of Visits 10   Date for PT Re-Evaluation 05/04/14   Authorization Type UHC 40 visit limit   PT Start Time 0804   PT Stop Time 0850   PT Time Calculation (min) 46 min      Past Medical History  Diagnosis Date  . HTN (hypertension)   . DJD (degenerative joint disease)   . Pneumonia   . Pneumonia with lung abscess   . Bronchiolitis   . Dizziness and giddiness 07/26/2012  . Tear of left rotator cuff   . Gastroesophageal reflux disease   . Bronchiectasis   . OSA (obstructive sleep apnea)     Past Surgical History  Procedure Laterality Date  . Knee arthroscopy      left  . Back surgery      multiple, DJD, cervical fusion, plate removed  . Video bronchoscopy      Dr Arlyce Dice  . Right video-assisted thoracoscopic surgery,  12/05/2010  . Tonsillectomy  1965  . Appendectomy  1969  . Neck surgery  [id number], 2002  . Lung surgery  2012,     removed right middle lobe,due to birth defect, removed half of right bottom lobe due to abcess  . Rotator cuff repair      no surgery, because of lung infection  . Carpal tunnel release Right 1999    There were no vitals taken for this visit.  Visit Diagnosis:  Abnormality of gait - Plan: PT plan of care cert/re-cert  Midline low back pain with right-sided sciatica - Plan: PT plan of care cert/re-cert      Subjective Assessment - 03/08/14 0807    Symptoms I go to [facility] to get my mouthpiece to sleep in. Feeling about the same. Pt also reports that being tired all the time isn't helpfing anything. He is getting a medical work up to determine why he is tired.    Currently in Pain? Yes   Pain Score 4    Pain Location Hip   Pain Orientation Right     Therex: Trialed knee to chest, and knee to opposite side of chest with pt reporting crossover pain. Performed again with core stabilization and tactile cues and able to achieve proper stretch without crossover pain. Performed 3x30 seconds each.  Posterior pelvic tilt stretch 3x30 seconds  R hamtring passive stretch  Manual therapy: R nerve adhesion manual technique with pt reporting electrical paresthesias  Self care: Thorough discussion on liklihood that his heavy pain medications may be contributing to the grogginess he experiences. Pt reports he has tried to come off of them in the past but couldn't due to severe back pain. Discussed plans to incorporate treatment of back pain into plan of care with the hopes that pt will not need as much pain medication (pt was informed that this is ultimately up to him and his physician as I cannot make any recommendations on medication dosage or prescriptions). Pt is agreeable to talk with his physician about this.   Neuro re-ed: Balance training on compliant balance beam: lateral walking with CGA and eyes open, then eyes closed with fingertip support, tandem walking on compliant balance  beam forwards and backwards with intermittent fingertip support, step up/step downs with eyes closed with fingertip support, weaving over the balance beam with eyes open and UE support.         PT Education - 03/08/14 0854    Education provided Yes   Education Details Plan of care, adding core stability to the plan see self care section of note   Person(s) Educated Patient   Methods Explanation   Comprehension Verbalized understanding          PT Short Term Goals - 03/08/14 0901    PT SHORT TERM GOAL #1   Title The patient will be indep with HEP for gaze adaptation, home walking, and balance activities.  Target date 04/06/2014   PT SHORT TERM GOAL #2   Title The patient will improve dynamic  gait index to > or equal to 18/24 to demo decreasing risk for falls.  Target date 04/06/2014   PT SHORT TERM GOAL #3   Title The patient will improve gait speed to > or equal to 3.0 ft/sec demonstrating improved functional mobility.  Target date 04/06/2014   PT SHORT TERM GOAL #4   Title complete Oswestry Back pain questionaire and write appropriate STG and LTG___________________________________           PT Long Term Goals - 03/08/14 0902    PT LONG TERM GOAL #1   Title The patient will be indep with HEP progression for d/c.  Target date 05/04/2014   PT LONG TERM GOAL #2   Title The patient will improve Berg to > or equal to 52/56 to demo improving balance.  Target date 05/04/2014   PT LONG TERM GOAL #3   Title The patient will imrpove DGI to > or equal to 22/24.  Target date 05/04/2014   PT LONG TERM GOAL #4   Title The patient will decrease DHI to < or equal to 50% to demo improved subjective report of dizziness.  Target date 05/04/2014   PT LONG TERM GOAL #5   Title Increase composite score on sensory organization test to 60 (this was pt's score in January 2015). Target date: 05/04/2014   Additional Long Term Goals   Additional Long Term Goals Yes   PT LONG TERM GOAL #6   Title complete Oswestry Back pain questionaire and write appropriate STG and LTG___________________________________           Plan - 03/08/14 8675    Clinical Impression Statement Pt reported that he has 1-2 days per month of having to spend the day in bed due to severe back pain. It is likely that this time in bed is causing decompensation of his already hypofunctioning vestibular system.  It will be imperative that we address the back pain in addition to the balance/vestibular system to decrease these episodes. With assessment of pt's lower extremity mobility today, the core stabilizers are very weak and appear to be contributing to pt's pain. In addition, pt has palpable tissue texture abnormalities with reports of  symptoms consistent with nerve adhesions on the  right posterior thigh.  Finally, pt appears quite groggy and reports he took pain medication prior to session today.    PT Frequency 2x / week   PT Duration 8 weeks   PT Treatment/Interventions Therapeutic activities;Neuromuscular re-education;Functional mobility training;Balance training;Patient/family education;Therapeutic exercise;Gait training;Manual techniques   PT Next Visit Plan add walking program and core stabilization to HEP  Problem List Patient Active Problem List   Diagnosis Date Noted  . Right-sided chest wall pain 06/02/2013  . Hypersomnia with sleep apnea, unspecified 02/20/2013  . Pneumonia due to Pseudomonas 02/20/2013  . Dizziness and giddiness 07/26/2012  . Memory deficit 07/26/2012  . Pneumonia with lung abscess   . DJD (degenerative joint disease)   . HTN (hypertension)   . Lung abscess 11/06/2010  . Bronchiectasis 11/06/2010    Rimas Gilham, Anderson [place] D 03/08/2014, 9:22 AM

## 2014-03-12 ENCOUNTER — Ambulatory Visit: Payer: 59

## 2014-03-12 DIAGNOSIS — R269 Unspecified abnormalities of gait and mobility: Secondary | ICD-10-CM

## 2014-03-12 DIAGNOSIS — M5441 Lumbago with sciatica, right side: Secondary | ICD-10-CM

## 2014-03-12 NOTE — Patient Instructions (Signed)
Glut Stretch  Lay on your back with both knees slightly bent Cross left ankle over right knee Feel a gentle stretch Hold 30 seconds Switch legs Perform 3x30 seconds on each leg, taking deep breaths. Be sure to tighten abs when lifting and lowering the leg.

## 2014-03-12 NOTE — Therapy (Signed)
[facility] [address] [place], [place], [address] Phone: [phone]   Fax:  [phone]  Physical Therapy Treatment  Patient Details  Name: [name] MRN: [id number] Date of Birth: 10-09-1955  Encounter Date: 03/12/2014      PT End of Session - 03/12/14 1220    Visit Number 4   Number of Visits 10   Date for PT Re-Evaluation 05/04/14   Authorization Type [employer] 40 visit limit   PT Start Time 0938   PT Stop Time 1018   PT Time Calculation (min) 40 min      Past Medical History  Diagnosis Date  . HTN (hypertension)   . DJD (degenerative joint disease)   . Pneumonia   . Pneumonia with lung abscess   . Bronchiolitis   . Dizziness and giddiness 07/26/2012  . Tear of left rotator cuff   . Gastroesophageal reflux disease   . Bronchiectasis   . OSA (obstructive sleep apnea)     Past Surgical History  Procedure Laterality Date  . Knee arthroscopy      left  . Back surgery      multiple, DJD, cervical fusion, plate removed  . Video bronchoscopy      Dr Arlyce Dice  . Right video-assisted thoracoscopic surgery,  12/05/2010  . Tonsillectomy  1965  . Appendectomy  1969  . Neck surgery  [id number], 2002  . Lung surgery  2012,     removed right middle lobe,due to birth defect, removed half of right bottom lobe due to abcess  . Rotator cuff repair      no surgery, because of lung infection  . Carpal tunnel release Right 1999    There were no vitals taken for this visit.  Visit Diagnosis:  Abnormality of gait  Midline low back pain with right-sided sciatica      Subjective Assessment - 03/12/14 0943    Symptoms I came without taking my medications today and I'm not doing too good (at start of session)   Currently in Pain? Yes   Pain Score 5    Pain Location Back  Right leg worse than left     Manual therapy to palpable trigger points in left buttocks (primarily gluteus maximus and piriformis) x35  minutes. Grade 2 sacral mobilization into posterior rotation for decompression of SI joint.   Therex: 3x30-60 seconds gluteus maximus stretch each LE (see pt instructions for details); lumbar rocking within pain free/non aggrevating range.                        PT Education - 03/12/14 1220    Education provided Yes   Education Details self trigger point release with tennis ball (verbal instructions); glut stretch (handout)   Person(s) Educated Patient   Methods Explanation;Demonstration;Verbal cues;Handout   Comprehension Verbalized understanding;Returned demonstration          PT Short Term Goals - 03/12/14 1223    PT SHORT TERM GOAL #1   Title The patient will be indep with HEP for gaze adaptation, home walking, and balance activities.  Target date 04/06/2014   PT SHORT TERM GOAL #2   Title The patient will improve dynamic gait index to > or equal to 18/24 to demo decreasing risk for falls.  Target date 04/06/2014   PT SHORT TERM GOAL #3   Title The patient will improve gait speed to > or equal to 3.0 ft/sec demonstrating improved functional mobility.  Target  date 04/06/2014   PT SHORT TERM GOAL #4   Title Oswestry Back Pain questionaire - decrease score to 12 points or 24% disability. Target date 04/06/14   Status New           PT Long Term Goals - 03/12/14 1225    PT LONG TERM GOAL #1   Title The patient will be indep with HEP progression for d/c.  Target date 05/04/2014   PT LONG TERM GOAL #2   Title The patient will improve Berg to > or equal to 52/56 to demo improving balance.  Target date 05/04/2014   PT LONG TERM GOAL #3   Title The patient will imrpove DGI to > or equal to 22/24.  Target date 05/04/2014   PT LONG TERM GOAL #4   Title The patient will decrease DHI to < or equal to 50% to demo improved subjective report of dizziness.  Target date 05/04/2014   PT LONG TERM GOAL #5   Title Increase composite score on sensory organization test to 60 (this  was pt's score in January 2015). Target date: 05/04/2014   PT LONG TERM GOAL #6   Title Decrease score on Oswestry Back Pain questionaire to 7 points or 14% impaired.    Status New               Plan - 03/12/14 1221    Clinical Impression Statement Pt has very tight gluteus maximus bilaterally, also has trigger points which are palpable throughout left buttock, and likely right as well but did not have time to assess today. He may have anteriorly rotated sacrum causing increased SI joint pain. Will continue per POC.        Problem List Patient Active Problem List   Diagnosis Date Noted  . Right-sided chest wall pain 06/02/2013  . Hypersomnia with sleep apnea, unspecified 02/20/2013  . Pneumonia due to Pseudomonas 02/20/2013  . Dizziness and giddiness 07/26/2012  . Memory deficit 07/26/2012  . Pneumonia with lung abscess   . DJD (degenerative joint disease)   . HTN (hypertension)   . Lung abscess 11/06/2010  . Bronchiectasis 11/06/2010    Delrae Sawyers D 03/12/2014, 12:28 PM  [facility] [address] [place], [place], [address] Phone: [phone]   Fax:  [phone]

## 2014-03-14 ENCOUNTER — Ambulatory Visit: Payer: 59

## 2014-03-14 DIAGNOSIS — R269 Unspecified abnormalities of gait and mobility: Secondary | ICD-10-CM

## 2014-03-14 DIAGNOSIS — M5441 Lumbago with sciatica, right side: Secondary | ICD-10-CM

## 2014-03-14 NOTE — Patient Instructions (Signed)
HIP: Hamstrings - Short Sitting   Scoot to the edge of a chair and put one leg extended in front of you. Keep knee straight and pull foot toward you. Lift chest and lean forward, leading with your chest. Hold 30 seconds. 3 reps on each leg. 1-2x per day  Copyright  VHI. All rights reserved.   Hip Flexor Stretch   Lying on back near edge of bed, bend one leg, foot flat. Hang other leg over edge, relaxed, and bend the knee until a gentle stretch is felt *be sure to keep your back flat to the bed to prevent pelvis from rolling and causing pain* Hold for 3 minutes on each leg.              When you are moving in bed, be sure to tighten/activate core muscles to stabilize the spine and prevent pain. http://gt2.exer.us/346   Copyright  VHI. All rights reserved.

## 2014-03-14 NOTE — Therapy (Signed)
[facility] [address] [place], [place], [address] Phone: [phone]   Fax:  [phone]  Physical Therapy Treatment  Patient Details  Name: [name] MRN: [id number] Date of Birth: [birth year]  Encounter Date: 03/14/2014      PT End of Session - 03/14/14 1154    Visit Number 5   Date for PT Re-Evaluation 05/04/14   Authorization Type [employer] 40 visit limit   PT Start Time 1102   PT Stop Time 1150   PT Time Calculation (min) 48 min      Past Medical History  Diagnosis Date  . HTN (hypertension)   . DJD (degenerative joint disease)   . Pneumonia   . Pneumonia with lung abscess   . Bronchiolitis   . Dizziness and giddiness 07/26/2012  . Tear of left rotator cuff   . Gastroesophageal reflux disease   . Bronchiectasis   . OSA (obstructive sleep apnea)     Past Surgical History  Procedure Laterality Date  . Knee arthroscopy      left  . Back surgery      multiple, DJD, cervical fusion, plate removed  . Video bronchoscopy      Dr Arlyce Dice  . Right video-assisted thoracoscopic surgery,  12/05/2010  . Tonsillectomy  1965  . Appendectomy  1969  . Neck surgery  [id number], 2002  . Lung surgery  2012,     removed right middle lobe,due to birth defect, removed half of right bottom lobe due to abcess  . Rotator cuff repair      no surgery, because of lung infection  . Carpal tunnel release Right 1999    There were no vitals taken for this visit.  Visit Diagnosis:  Abnormality of gait  Midline low back pain with right-sided sciatica      Subjective Assessment - 03/14/14 1114    Symptoms taking medication today because the bad weather causes increased pain   Currently in Pain? Yes   Pain Score 4      3x30 seconds each leg passive hamstring stretch with pt performing active core stabilization 3x30 seconds hip adductor stretch bilateral 3x30 seconds gluteus maximus stretch each leg 3x30 seconds  passive quads stretch Pt was taught how to perform hamstrings and hip flexor stretch as HEP 3x10 glut kickbacks with MIN A + verbal cues to decrease excessive pelvic tilting Abdominal bracing with bed mobility for core stabilization  Pt education regarding each exercise and how it affects the muscles causing his pain.                        PT Education - 03/14/14 1153    Education provided Yes   Education Details HEP-hamstring and hip flexor stretching   Person(s) Educated Patient   Methods Explanation;Handout;Demonstration   Comprehension Verbalized understanding;Returned demonstration          PT Short Term Goals - 03/12/14 1223    PT SHORT TERM GOAL #1   Title The patient will be indep with HEP for gaze adaptation, home walking, and balance activities.  Target date 04/06/2014   PT SHORT TERM GOAL #2   Title The patient will improve dynamic gait index to > or equal to 18/24 to demo decreasing risk for falls.  Target date 04/06/2014   PT SHORT TERM GOAL #3   Title The patient will improve gait speed to > or equal to 3.0 ft/sec demonstrating improved functional mobility.  Target date  04/06/2014   PT SHORT TERM GOAL #4   Title Oswestry Back Pain questionaire - decrease score to 12 points or 24% disability. Target date 04/06/14   Status New           PT Long Term Goals - 03/12/14 1225    PT LONG TERM GOAL #1   Title The patient will be indep with HEP progression for d/c.  Target date 05/04/2014   PT LONG TERM GOAL #2   Title The patient will improve Berg to > or equal to 52/56 to demo improving balance.  Target date 05/04/2014   PT LONG TERM GOAL #3   Title The patient will imrpove DGI to > or equal to 22/24.  Target date 05/04/2014   PT LONG TERM GOAL #4   Title The patient will decrease DHI to < or equal to 50% to demo improved subjective report of dizziness.  Target date 05/04/2014   PT LONG TERM GOAL #5   Title Increase composite score on sensory organization  test to 60 (this was pt's score in January 2015). Target date: 05/04/2014   PT LONG TERM GOAL #6   Title Decrease score on Oswestry Back Pain questionaire to 7 points or 14% impaired.    Status New               Plan - 03/14/14 1156    Clinical Impression Statement Pt has notably tight hamstrings, quads and gluts which appear to be contributing to his back (sacroiliac joint) and buttock pain. Pt will continue to benefit from skilled PT services to address his pain and vestibular impairments.   PT Next Visit Plan Continue with core stabilizatoin        Problem List Patient Active Problem List   Diagnosis Date Noted  . Right-sided chest wall pain 06/02/2013  . Hypersomnia with sleep apnea, unspecified 02/20/2013  . Pneumonia due to Pseudomonas 02/20/2013  . Dizziness and giddiness 07/26/2012  . Memory deficit 07/26/2012  . Pneumonia with lung abscess   . DJD (degenerative joint disease)   . HTN (hypertension)   . Lung abscess 11/06/2010  . Bronchiectasis 11/06/2010    Delrae Sawyers D 03/14/2014, 12:05 PM  [facility] [address] [place], [place], [address] Phone: [phone]   Fax:  [phone]

## 2014-03-23 HISTORY — PX: OTHER SURGICAL HISTORY: SHX169

## 2014-03-26 ENCOUNTER — Ambulatory Visit: Payer: 59

## 2014-03-28 ENCOUNTER — Ambulatory Visit: Payer: 59

## 2014-04-02 ENCOUNTER — Ambulatory Visit: Payer: 59

## 2014-04-04 ENCOUNTER — Ambulatory Visit: Payer: 59

## 2014-04-09 ENCOUNTER — Ambulatory Visit: Payer: 59 | Admitting: Physical Therapy

## 2014-04-11 ENCOUNTER — Ambulatory Visit: Payer: 59

## 2014-04-16 ENCOUNTER — Ambulatory Visit: Payer: 59

## 2014-04-18 ENCOUNTER — Ambulatory Visit: Payer: 59

## 2014-04-23 ENCOUNTER — Ambulatory Visit: Payer: 59

## 2014-04-25 ENCOUNTER — Ambulatory Visit: Payer: 59

## 2014-04-30 ENCOUNTER — Ambulatory Visit: Payer: 59

## 2014-05-02 ENCOUNTER — Ambulatory Visit: Payer: 59

## 2014-07-05 NOTE — Therapy (Signed)
[facility] [address] [place], [place], [address] Phone: [phone]   Fax:  [phone]  Patient Details  Name: [name] MRN: [id number] Date of Birth: [birth year] Referring Provider:  No ref. provider found  Encounter Date: 07/05/2014  PHYSICAL THERAPY DISCHARGE SUMMARY  Visits from Start of Care: 5  Pt did not meet therapy goals due to requesting d/c prior to being able to check goals.   Remaining deficits: Vertigo and back pain   Education / Equipment: Home exercise program to address vertigo and back pain. Also education on how pt's vertigo is related to pt's debilitating low back pain. When the back pain is severe, he reports that he will spend days in the bed with as little movement as possible. When this occurs, I suspect that his vestibular system is decompensating causing increased dizziness.  Plan: Patient agrees to discharge.  Patient goals were not met. Patient is being discharged due to the patient's request.  ?????      Delrae Sawyers, PT,DPT,NCS 07/05/2014 2:09 PM Phone [phone] FAX [phone]         [facility] [address] [place], [place], [address] Phone: [phone]   Fax:  [phone]

## 2014-09-18 ENCOUNTER — Other Ambulatory Visit: Payer: Self-pay | Admitting: Gastroenterology

## 2016-01-03 ENCOUNTER — Other Ambulatory Visit (HOSPITAL_COMMUNITY): Payer: Self-pay | Admitting: Respiratory Therapy

## 2016-01-03 DIAGNOSIS — J479 Bronchiectasis, uncomplicated: Secondary | ICD-10-CM

## 2016-01-10 ENCOUNTER — Ambulatory Visit (HOSPITAL_COMMUNITY)
Admission: RE | Admit: 2016-01-10 | Discharge: 2016-01-10 | Disposition: A | Discharge status: Home / Self Care | Payer: 59 | Source: Ambulatory Visit | Attending: *Deleted | Admitting: *Deleted

## 2016-01-10 DIAGNOSIS — J479 Bronchiectasis, uncomplicated: Secondary | ICD-10-CM | POA: Insufficient documentation

## 2016-01-10 LAB — SPIROMETRY WITH GRAPH
FEF 25-75 Pre: 0.74 L/sec
FEF2575-%Pred-Pre: 27 %
FEV1-%Pred-Pre: 50 %
FEV1-Pre: 1.68 L
FEV1FVC-%Pred-Pre: 80 %
FEV6-%Pred-Pre: 63 %
FEV6-Pre: 2.65 L
FEV6FVC-%Pred-Pre: 100 %
FVC-%Pred-Pre: 62 %
FVC-Pre: 2.76 L
Pre FEV1/FVC ratio: 61 %
Pre FEV6/FVC Ratio: 96 %

## 2016-03-24 DIAGNOSIS — E291 Testicular hypofunction: Secondary | ICD-10-CM | POA: Diagnosis not present

## 2016-03-24 DIAGNOSIS — J479 Bronchiectasis, uncomplicated: Secondary | ICD-10-CM | POA: Diagnosis not present

## 2016-03-27 ENCOUNTER — Other Ambulatory Visit (HOSPITAL_COMMUNITY): Payer: Self-pay | Admitting: *Deleted

## 2016-03-27 DIAGNOSIS — J479 Bronchiectasis, uncomplicated: Secondary | ICD-10-CM

## 2016-03-30 DIAGNOSIS — J00 Acute nasopharyngitis [common cold]: Secondary | ICD-10-CM | POA: Diagnosis not present

## 2016-03-31 ENCOUNTER — Ambulatory Visit (HOSPITAL_COMMUNITY)
Admission: RE | Admit: 2016-03-31 | Discharge: 2016-03-31 | Disposition: A | Discharge status: Home / Self Care | Payer: 59 | Source: Ambulatory Visit | Attending: *Deleted | Admitting: *Deleted

## 2016-03-31 ENCOUNTER — Encounter (HOSPITAL_COMMUNITY): Payer: Self-pay

## 2016-03-31 DIAGNOSIS — N200 Calculus of kidney: Secondary | ICD-10-CM | POA: Insufficient documentation

## 2016-03-31 DIAGNOSIS — I2510 Atherosclerotic heart disease of native coronary artery without angina pectoris: Secondary | ICD-10-CM | POA: Insufficient documentation

## 2016-03-31 DIAGNOSIS — J479 Bronchiectasis, uncomplicated: Secondary | ICD-10-CM

## 2016-03-31 DIAGNOSIS — J948 Other specified pleural conditions: Secondary | ICD-10-CM | POA: Diagnosis not present

## 2016-04-14 DIAGNOSIS — E291 Testicular hypofunction: Secondary | ICD-10-CM | POA: Diagnosis not present

## 2016-05-05 DIAGNOSIS — E291 Testicular hypofunction: Secondary | ICD-10-CM | POA: Diagnosis not present

## 2016-05-26 DIAGNOSIS — E291 Testicular hypofunction: Secondary | ICD-10-CM | POA: Diagnosis not present

## 2016-06-30 DIAGNOSIS — Z0000 Encounter for general adult medical examination without abnormal findings: Secondary | ICD-10-CM | POA: Diagnosis not present

## 2016-06-30 DIAGNOSIS — J479 Bronchiectasis, uncomplicated: Secondary | ICD-10-CM | POA: Diagnosis not present

## 2016-06-30 DIAGNOSIS — E291 Testicular hypofunction: Secondary | ICD-10-CM | POA: Diagnosis not present

## 2016-07-21 DIAGNOSIS — R0781 Pleurodynia: Secondary | ICD-10-CM | POA: Diagnosis not present

## 2016-07-21 DIAGNOSIS — J479 Bronchiectasis, uncomplicated: Secondary | ICD-10-CM | POA: Diagnosis not present

## 2016-07-21 DIAGNOSIS — R0789 Other chest pain: Secondary | ICD-10-CM | POA: Diagnosis not present

## 2016-07-21 DIAGNOSIS — E291 Testicular hypofunction: Secondary | ICD-10-CM | POA: Diagnosis not present

## 2016-07-21 NOTE — Unmapped (Addendum)
[facility]  [facility]ct[facility]    Assessment:      Patient:Jordan [name] (07-29-1955)  Reason for visit: Jordan [name] is a 61 y.o.male who returns for routine follow up of his idiopathic bronchiectasis. Despite excellent compliance with his airway clearance regimen and inhaled antibiotics, he continues to have an unacceptably high exacerbation frequency.  His BSI score places him into the severe bronchiectasis category and therefore, it is imperative to manage his bronchiectasis more aggressively.     Plan:      Problem List Items Addressed This Visit        Respiratory    Bronchiectasis (CMS-HCC)     Bronchiectasis Severity Index Score:    Age 15-69 = 2   BMI 18.5 or higher = 0   FEV1 % predicted 50-80% = 1   Hospitalized for severe exacerbation in past 2 years Yes = 5   Exacerbations in previous year 3 or more = 2   mMRC dyspnea score 1 = I get short of breath when hurrying on level ground or walking up a slight hill.   Chronic Pseudomonas colonization (at least 2 positive cultures at least 3 months apart within 1 year) Yes = 3   Colonization with other potential pathogenic bacteria (at least 2 positive cultures at least 3 months apart within 1 year) No = 0   Radiologic Severity 3 or more lobes or cystic brx = 1       Total Score 15     BSI score of 9+ = Severe bronchiectasis (1 year outcomes 7.6 - 10.5% mortality, 16.7 - 52.6% hospitalization rate; 4 year outcomes 9.9 - 29.2% mortality, 41.2 - 80.4% hospitalization)    [name] et al. The Bronchiectasis Severity Index: An International Derivation and Validation Study. AJRCCM, 2013; 189(5): 454-098.)           Relevant Medications    HYDROcodone-homatropine (HYCODAN) 5-1.5 mg/5 mL syrup    albuterol (PROAIR HFA) 90 mcg/actuation inhaler    sodium chloride 7% 7 % Nebu    azithromycin (ZITHROMAX) 250 MG tablet    Other Relevant Orders    AFB SMEAR      I am starting Jordan [name] on azithromycin 250 mg daily for the anti-inflammatory effect, which is well described in non-CF bronchiectasis patients, particularly those with chronic Pseudomonas colonization.  Due to the risk of prolonging his QTc, I asked Jordan [name] to hold the azithromycin if he needs to start Cipro for an exacerbation.       Since Jordan [name] is not obtaining optimal airway clearance using hypertonic saline and the [place], we are pursuing other methods of airway clearance.  We discussed the InCourage vest from [employer] as 1 option. Given the chest wall pain Jordan [name] has been experiencing, a percussive vest may worsen this.  Of all the percussive vests, the InCourage vest is the best option since it has active venting.  [employer] does offer a 30 day trial so Jordan [name] can see if it worsens his chest wall pain.  The other option we discussed was the Vibralung, which uses sound waves transmitted into the airway for clearance.  He can also nebulize the hypertonic saline through it.  This would be our second option if he can't tolerate the Vest.    Jordan [name] has bronchiectasis due to unknown cause as seen on chest CT done on 03/31/16. Bronchiectasis has resulted in a chronic cough that has been present for over 6 months as well  as 4 exacerbations requiring antibiotic treatment over the past 12 months.    For airway clearance, he has tried and failed: Acapella/Flutter/PEP, Breathing Techniques/Huff Cough, Exercise and Nebulized Hypertonic Saline.  These methods failed, are contraindicated, or inappropriate for the following reasons: Did Not Mobilize Secretions and No Skilled Caregiver.    Therefore, it is medically necessary for Jordan [name] to use a percussive Vest for airway clearance. I have ordered it from [employer].    To help him remember what day he starts his inhaled antibiotics, I suggested switching between [name] and Colistin on the first day of the month.  He will be off inhaled antibiotics for a few days but most patients tolerate it very well.  Jordan [name] will let me know if he feels like we should try using the [name] and Colistin in 2 week cycles.  He met with our clinical pharmacist to discuss some issues he has been having with drawing up his Colistin.    Sputum was obtained today and sent for bacterial and AFB cultures.    I encouraged Jordan [name] to reach out to Dr. Oneida Alar about the worsening rib pain he is experiencing.    For his intermittent hoarseness, we discussed having ENT evaluate him but at this time, Jordan [name] is not interested in pursuing that .    Return to clinic in 3 months for repeat spirometry, sputum culture, and monitoring.  Call if questions or concerns prior to next visit.     Subjective:      HPI: Jordan [name] is a 61 y.o. male who returns for follow-up of idiopathic bronchiectasis.      Since the last clinic visit on 03/24/16:  ?? he has been treated for an exacerbation 2 times.  his last exacerbation was March 20. He has been logging his exacerbations and the start dates for antibiotics are: Jan 14, Nov 10, Sept (28 days). In total, he has been treated 9 times since November 2016.  ?? he has not been admitted for an exacerbation.  ?? he has not experienced hemoptysis.     ?? Cough is productive of 4-5 tablespoons of yellow sputum. Recently developed coughing fits if he has been outside.  ?? Wheezing is present.  Chest tightness is present. Using rescue albuterol couple times per day.  ?? Pleurisy is absent.  Having more of the rib popping sensation - occurs over bilateral lower ribs as well as just below the nipple line on both sides.  The later pain is not adjacent to the sternum.  Had rib popping over lower right side as he was pulling into the parking lot - no preceding event.  ?? He is on Colistin now and will switch to [name] on the 8th.  Doesn't think that the exacerbation frequency is related to what inhaled antibiotic his is taking at that time.    For airway clearance, Jordan [name] is using Aerobika 2 times per day and Hypertonic Saline 7% 2 times per day.  Exercise capacity is rated as good.  MMRC Stage:1 = I get short of breath when hurrying on level ground or walking up a slight hill.    Jordan [name] is not on home oxygen therapy.     Review of Systems  Dysphagia happening less often than in November 2016.  However, now experiencing painless hoarseness that comes and goes without clear preceding event. Remainder of a complete review of systems was negative unless mentioned above.    Past Medical History:   Diagnosis Date   ??? Arthritis  back   ??? Biceps tendon tear 2013    left side   ??? Bronchiectasis (CMS-HCC)     chronic psuedomonas infection   ??? Degenerative joint disease of left acromioclavicular joint    ??? GERD (gastroesophageal reflux disease)    ??? Hypertension (RAF-HCC)    ??? Obstructive sleep apnea on CPAP 03/14/13    AHI 33.5, on BiPAP 12/8 based on sleep study 09/2013   ??? Pneumonia 2012    Lung abcess    ??? PONV (postoperative nausea and vomiting)    ??? Rotator cuff injury left   ??? Vertigo        Current Outpatient Prescriptions   Medication Sig Dispense Refill   ??? albuterol (PROAIR HFA) 90 mcg/actuation inhaler Inhale 2 puffs every four (4) hours as needed for wheezing or shortness of breath. 3 Inhaler 3   ??? albuterol 2.5 mg /3 mL (0.083 %) nebulizer solution Inhale 2.5 mg by nebulization Four (4) times a day.     ??? alcohol swabs PadM Use as directed with inhaled antibiotics 100 each 0   ??? ascorbic acid (CHEWABLE VITAMIN C) 250 mg Chew Chew 1,000 mg Two (2) times a day.      ??? CIALIS 20 mg tablet      ??? ciprofloxacin HCl (CIPRO) 750 MG tablet Take 1 tablet (750 mg total) by mouth Two (2) times a day. 28 tablet 0   ??? colistimethate (COLYMYCIN) 150 mg injection Inhale 2 mL (150 mg total) Two (2) times a day. Inject 2mL SWFI into colistin vial, then withdraw 2mL (150mg ) for dose. 60 vial 5   ??? ferrous sulfate (IRON, FERROUS SULFATE,) 325 (65 FE) MG tablet Take 325 mg by mouth Two (2) times a day.     ??? fluticasone-salmeterol (ADVAIR HFA) 230-21 mcg/actuation inhaler Inhale 2 puffs Two (2) times a day. 1 Inhaler 11   ??? HYDROcodone-acetaminophen (NORCO) 7.5-325 mg per tablet Take 1 tablet by mouth three (3) times a day (at 6am, noon and 6pm).     ??? HYDROcodone-homatropine (HYCODAN) 5-1.5 mg/5 mL syrup Take 5 mL by mouth every four (4) hours as needed for cough. 120 mL 0   ??? mucus clearing device Devi Use Acapella twice daily for airway clearance. Dx. 494.0. 1 Device 0   ??? omeprazole (PRILOSEC) 20 MG capsule Take 20 mg by mouth Two (2) times a day.      ??? PARI LC D NEBULIZER Misc Provide 1 device  for use with inhaled medication. 1 each 11   ??? sodium chloride 7% 7 % Nebu Inhale 4 mL by nebulization 4 (four) times a day. 1440 mL 3   ??? sterile water, PF, Soln Use 2mL to mix colistin, add 2mL more to neb cup with 2mL of mixed colistin,nebulize twice a day 60 ampule 5   ??? syringe with needle (SYRINGE 3CC/20GX1) 3 mL 20 gauge x 1 Syrg For use with inhaled antibiotic 100 Syringe 5   ??? testosterone cypionate (DEPOTESTOTERONE CYPIONATE) 200 mg/mL injection Frequency:QOW   Dosage:0.0     Instructions:  Note:Dose: 200 MG/ML     ??? tobramycin, PF, ([name]) 300 mg/5 mL nebulizer solution Inhale 5 mL (300 mg total) by nebulization every twelve (12) hours. Use for 28 days every other month 280 mL 5     No current facility-administered medications for this visit.        Allergies  Reviewed on: 03/24/2016      Reactions Comment    Gentamicin Other (See  Comments) BALANCE ISSUES    Tobramycin Other (See Comments) ototoxicity    Morphine Nausea And Vomiting         Social History     Social History   ??? Marital status: Married     Spouse name: Jordan [name]   ??? Number of children: 2   ??? Years of education: N/A     Occupational History   ???  Not Employed     Social History Main Topics   ??? Smoking status: Never Smoker   ??? Smokeless tobacco: Never Used   ??? Alcohol use No   ??? Drug use: No   ??? Sexual activity: Not on file     Other Topics Concern   ??? 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         Objective:   Objective:   BP 127/74  - Pulse 97  - Temp 36.7 ??C (98 ??F)  - Resp 16  - Ht 174 cm (5' 8.5)  - Wt 78.9 kg (174 lb)  - SpO2 95%  - BMI 26.07 kg/m??   General Appearance:   Caucasian male appearing fatigued but comfortable, non-toxic, in no distress, and stated age.   Eyes:  PERRL, conjunctiva clear, EOM's intact. No drainage.   Nose: Nares normal, septum midline. Normal mucosa.  No drainage or polyps.  No sinus tenderness.   Oropharynx: Moist mucus membranes without lesions or thrush.  Good dentition.  Posterior pharynx clear.   Respiratory:   End inspiratory squeak scattered throughout. End expiratory crackles. Decreased air entry over right lower lobe, consistent with resection. Easy work of breathing without accessory muscle use.     Cardiovascular:  Regular rate and rhythm. S1 and S2 normal. No murmur, rub  or gallop.  Pulses intact and symmetric.   No peripheral cyanosis or edema.   Musculoskeletal: No tenderness or deformity of the chest wall.  Joints normal.   Clubbing.   Skin: No rashes, lesions, skin changes.   Heme/Lymph: No cervical, supraclavicular, submandibular, or suprasternal adenopathy.  No bruising or petechiae.   Neurologic: Alert and oriented x3. No focal neurological deficits.  Normal gait.      Diagnostic Review:   Pulmonary Functions Testing Results:  Unable repeatedly perform testing today due to pain.    Chest CT (03/31/16): Images personally reviewed. Status post right middle and right lower lobectomies. Tiny loculated basilar right hydropneumothorax, probably chronic. Moderate cylindrical and varicoid bronchiectasis throughout both lungs, with associated scattered mucoid impaction, tree-in-bud opacities and scattered peribronchovascular and subpleural consolidation in the right upper lobe.     Cultures  Lab Results   Component Value Date    LABLORES 3+ Oropharyngeal Flora Isolated 03/24/2016    LABLORES 3+ Mucoid Pseudomonas aeruginosa (A) 03/24/2016    LABLORES 3+ Smooth Pseudomonas aeruginosa (A) 03/24/2016    ORG Smooth Pseudomonas aeruginosa (A) 05/25/2014    ORG Mucoid Pseudomonas aeruginosa (A) 05/25/2014    ORG Mould (A) 05/25/2014    AFBCX  03/24/2016     Overgrown with bacteria/fungus; unable to evaluate for acid fast bacilli.    AFBCX No Acid Fast Bacilli Detected 07/02/2015     Health Care Maintenance:  Most Recent Immunizations   Administered Date(s) Administered   ??? INFLUENZA TIV (TRI) 73MO+ W/ PRESERV (IM) 01/07/2012   ??? INFLUENZA TIV (TRI) PF (IM) 12/09/2012   ???  Influenza Vaccine Quad (IIV4 PF) 32mo+ injectable 01/31/2016   ??? Influenza Virus Vaccine, unspecified formulation 12/25/2014   ??? Influenza Whole 12/21/2008   ??? Pneumococcal Conjugate 13-Valent 04/28/2013   ??? Pneumococcal Polysaccharide 23 02/07/2016   ??? TdaP 03/22/2012

## 2016-08-11 DIAGNOSIS — E291 Testicular hypofunction: Secondary | ICD-10-CM | POA: Diagnosis not present

## 2016-08-11 DIAGNOSIS — J479 Bronchiectasis, uncomplicated: Secondary | ICD-10-CM | POA: Diagnosis not present

## 2016-08-11 DIAGNOSIS — R490 Dysphonia: Secondary | ICD-10-CM | POA: Diagnosis not present

## 2016-08-11 DIAGNOSIS — K219 Gastro-esophageal reflux disease without esophagitis: Secondary | ICD-10-CM | POA: Diagnosis not present

## 2016-08-18 NOTE — Unmapped (Addendum)
error 

## 2016-09-01 DIAGNOSIS — E291 Testicular hypofunction: Secondary | ICD-10-CM | POA: Diagnosis not present

## 2016-09-01 DIAGNOSIS — D1801 Hemangioma of skin and subcutaneous tissue: Secondary | ICD-10-CM | POA: Diagnosis not present

## 2016-09-01 DIAGNOSIS — Z85828 Personal history of other malignant neoplasm of skin: Secondary | ICD-10-CM | POA: Diagnosis not present

## 2016-09-01 DIAGNOSIS — D225 Melanocytic nevi of trunk: Secondary | ICD-10-CM | POA: Diagnosis not present

## 2016-09-01 DIAGNOSIS — L905 Scar conditions and fibrosis of skin: Secondary | ICD-10-CM | POA: Diagnosis not present

## 2016-09-22 DIAGNOSIS — E291 Testicular hypofunction: Secondary | ICD-10-CM | POA: Diagnosis not present

## 2016-10-06 NOTE — Unmapped (Signed)
Changed enrollment to Pulmonary, not CF   Updated medications filled at [facility].  Fills medications: inhaled colistin and supplies  Next refill coordination date set to: 10/07/16 (last filled 08/19/16, every other month cycled)    Anell Barr, PharmD, BCPS, CPP  Clinical Pharmacist Practitioner  [facility]  Pager: [phone]

## 2016-10-07 NOTE — Unmapped (Signed)
[facility] Refill Coordination Note: Pulmonary    Specialty Medication(s) and additional medications shipped:   -Inhaled colistin 150mg  and kit: sodium chloride 0.9%, syringes with needles, Pari nebulizer cup, alcohol swabs     Jordan [name], DOB: 22-Dec-1955    CF [facility] Active? N/A-not CF patient    Medication Adherence    Patient reported X missed doses in the last month:  0  Specialty Medication:  COLISTIN  QOH 0 NEXT START DATE:8/1  Patient is on additional specialty medications:  No  Informant:  patient  Confirmed plan for next specialty medication refill:  delivery by pharmacy  Medication Assistance Program  Refill Coordination  Has the Patient's Contact Information Changed:  No  Is the Shipping Address Different:  No  Shipping Information  Delivery Scheduled:  Yes  Delivery Date:  10/15/16  Medications to be Shipped:  COLISTIN KIT                -Confirmed the medication and dosage are correct and have not changed: Yes, regimen is correct and unchanged.  -Confirmed patient started or stopped the following medications in the past month:  No, there are no changes reported at this time.  -Are you tolerating your medication?:           ADDITIONAL NOTES          N/A           FINANCIAL/SHIPPING     Delivery Scheduled: Yes, Expected medication delivery date: 10/15/16             Jolene Schimke  Specialty Pharmacy Technician

## 2016-10-13 DIAGNOSIS — E291 Testicular hypofunction: Secondary | ICD-10-CM | POA: Diagnosis not present

## 2016-10-13 MED FILL — BD IM 3mL/22G 1 INCH/SYR: BD IM 3mL/22G 1 INCH/SYR | 30 days supply | Qty: 60 | Fill #4

## 2016-10-13 MED FILL — ALCOHOL PADS//: ALCOHOL PADS// | 50 days supply | Qty: 1 | Fill #0

## 2016-10-13 MED FILL — PARI LC PLUS NEBULIZER SET//MISC: PARI LC PLUS NEBULIZER SET//MISC | 30 days supply | Qty: 1 | Fill #2

## 2016-10-13 MED FILL — COLISTIMETHATE SODIUM/150MG/SOLR: COLISTIMETHATE SODIUM/150MG/SOLR | 30 days supply | Qty: 60 | Fill #5

## 2016-10-13 MED FILL — NORMAL SALINE FLUSH FOR F/0.9%/SOLN: NORMAL SALINE FLUSH FOR F/0.9%/SOLN | 30 days supply | Qty: 600 | Fill #1

## 2016-11-03 DIAGNOSIS — E291 Testicular hypofunction: Secondary | ICD-10-CM | POA: Diagnosis not present

## 2016-12-01 DIAGNOSIS — E291 Testicular hypofunction: Secondary | ICD-10-CM | POA: Diagnosis not present

## 2016-12-01 DIAGNOSIS — Z23 Encounter for immunization: Secondary | ICD-10-CM | POA: Diagnosis not present

## 2016-12-08 NOTE — Unmapped (Signed)
[facility] Refill Coordination Note  Medication: COLISTIN KIT     Unable to reach patient to schedule shipment for medication being filled at [facility]. Left voicemail on phone.  As this is the 3rd unsuccessful attempt to reach the patient, no additional phone call attempts will be made at this time.      Phone numbers attempted: [phone]  Last scheduled delivery: 10/13/16    Please call the [facility] at [phone] (option 4) should you have any further questions.      Thanks,  [facility] Specialty Team

## 2016-12-11 NOTE — Unmapped (Signed)
Pulmonary Clinic Pharmacist Note: Overdue Refills     December 11, 2016 10:52 AM   Left voicemail for LAMAJ METOYER to call back regarding next delivery of Colistin and supplies from [facility].    Last sent to patient on 10/14/16.  Provided patient [facility] Phone [phone] option 4.    Anell Barr, PharmD, BCPS, CPP  Clinical Pharmacist Practitioner  [facility] / [facility] Bronchiectasis [facility]  Pager: [phone]

## 2016-12-16 MED FILL — BD IM 3mL/22G 1 INCH/SYR: BD IM 3mL/22G 1 INCH/SYR | 30 days supply | Qty: 60 | Fill #5

## 2016-12-16 MED FILL — NORMAL SALINE FLUSH/0.9%/SOLN: NORMAL SALINE FLUSH/0.9%/SOLN | 30 days supply | Qty: 60 | Fill #2

## 2016-12-16 MED FILL — ALCOHOL PADS//: ALCOHOL PADS// | 50 days supply | Qty: 1 | Fill #1

## 2016-12-16 MED FILL — PARI LC PLUS NEBULIZER SET//MISC: PARI LC PLUS NEBULIZER SET//MISC | 30 days supply | Qty: 1 | Fill #3

## 2016-12-16 NOTE — Unmapped (Signed)
[facility] Refill Coordination Note  Specialty Medication(s): ALCOHOL PADS, NEB CUPS, COLISTIN, NORMAL SALINE, SYRINGES  Additional Medications shipped:     MICHAEL WALRATH, DOB: 1955/05/28  Phone: [phone] (home) , Alternate phone contact: N/A  Phone or address changes today?: No  All above HIPAA information was verified with patient.  Shipping Address: [address]  [place] [address]   Insurance changes? No    Completed refill call assessment today to schedule patient's medication shipment from the [facility] [phone]).      Confirmed the medication and dosage are correct and have not changed: Yes, regimen is correct and unchanged.    Confirmed patient started or stopped the following medications in the past month:  No, there are no changes reported at this time.    Are you tolerating your medication?:  Astor reports tolerating the medication.    ADHERENCE    (Below is required for Medicare Part B or Transplant patients only - per drug):   How many tablets were dispensed last month: 60  Patient currently has 5 remaining.    Did you miss any doses in the past 4 weeks? No missed doses reported.    FINANCIAL/SHIPPING    Delivery Scheduled: Yes, Expected medication delivery date: 12/18/16     Rigby did not have any additional questions at this time.    Delivery address validated in [facility] scheduling system: Yes, address listed in [facility] is correct.    We will follow up with patient monthly for standard refill processing and delivery.      Thank you,  Mitzi Davenport   [facility] Specialty Technician

## 2016-12-17 MED ORDER — COLISTIN (COLISTIMETHATE SODIUM) 150 MG SOLUTION FOR INJECTION: each | 5 refills | 0 days

## 2016-12-17 MED ORDER — WATER FOR INJECTION, STERILE INJECTION SOLUTION: ampule | 5 refills | 0 days | Status: AC

## 2016-12-17 MED ORDER — SYRINGE WITH NEEDLE 3 ML 20 GAUGE X 1"
INJECTION | 5 refills | 0 days | Status: CP
Start: 2016-12-17 — End: 2017-12-05

## 2016-12-17 MED ORDER — COLISTIN (COLISTIMETHATE SODIUM) 150 MG SOLUTION FOR INJECTION
Freq: Two times a day (BID) | RESPIRATORY_TRACT | 5 refills | 0.00000 days | Status: CP
Start: 2016-12-17 — End: 2017-12-02

## 2016-12-17 MED ORDER — SYRINGE WITH NEEDLE 3 ML 20 GAUGE X 1 1/2": each | 40 refills | 0 days

## 2016-12-17 MED ORDER — WATER FOR INJECTION, STERILE INJECTION SOLUTION
ORAL | 5 refills | 0.00000 days | Status: CP
Start: 2016-12-17 — End: 2017-12-05

## 2016-12-17 MED ORDER — SYRINGE WITH NEEDLE 3 ML 20 GAUGE X 1 1/2"
40 refills | 0.00000 days
Start: 2016-12-17 — End: 2016-12-17

## 2016-12-17 MED ORDER — WATER FOR INJECTION, STERILE INJECTION SOLUTION: mL | 5 refills | 0 days

## 2016-12-17 MED ORDER — LC PLUS MISC
11 refills | 0 days | Status: CP
Start: 2016-12-17 — End: 2017-12-05

## 2016-12-17 MED FILL — COLISTIMETHATE SODIUM/150MG/SOLR: COLISTIMETHATE SODIUM/150MG/SOLR | 30 days supply | Qty: 60 | Fill #0

## 2016-12-17 NOTE — Unmapped (Signed)
Bronchiectasis Clinic Pharmacist Note: Medication Management    Renewal for Colistin to [facility].    Anell Barr, PharmD, BCPS, CPP  Clinical Pharmacist Practitioner  [facility] Adult Cystic Fibrosis [facility] / [facility] Bronchiectasis [facility]  Pager: [phone]      CC: M. Rolm Baptise

## 2016-12-22 NOTE — Unmapped (Signed)
Faxed a prescription to [employer].

## 2016-12-29 ENCOUNTER — Ambulatory Visit
Admission: RE | Admit: 2016-12-29 | Discharge: 2016-12-29 | Disposition: A | Discharge status: Home / Self Care | Payer: Commercial Managed Care - PPO | Attending: Internal Medicine | Admitting: Internal Medicine

## 2016-12-29 ENCOUNTER — Ambulatory Visit
Admission: RE | Admit: 2016-12-29 | Discharge: 2016-12-29 | Disposition: A | Discharge status: Home / Self Care | Payer: Commercial Managed Care - PPO

## 2016-12-29 DIAGNOSIS — J479 Bronchiectasis, uncomplicated: Principal | ICD-10-CM

## 2016-12-29 DIAGNOSIS — B370 Candidal stomatitis: Secondary | ICD-10-CM | POA: Diagnosis not present

## 2016-12-29 DIAGNOSIS — E291 Testicular hypofunction: Secondary | ICD-10-CM | POA: Diagnosis not present

## 2016-12-29 MED ORDER — BUDESONIDE-FORMOTEROL HFA 160 MCG-4.5 MCG/ACTUATION AEROSOL INHALER
Freq: Two times a day (BID) | RESPIRATORY_TRACT | 6 refills | 0 days | Status: CP
Start: 2016-12-29 — End: 2017-03-19

## 2016-12-29 MED ORDER — HYDROCODONE-HOMATROPINE 5 MG-1.5 MG/5 ML ORAL SYRUP
ORAL | 0 refills | 0.00000 days | Status: CP | PRN
Start: 2016-12-29 — End: 2017-04-07

## 2016-12-29 MED ORDER — NYSTATIN 100,000 UNIT/ML ORAL SUSPENSION
Freq: Four times a day (QID) | ORAL | 0 refills | 0 days | Status: CP
Start: 2016-12-29 — End: 2017-01-12

## 2016-12-29 NOTE — Unmapped (Signed)
[facility]  [facility]    Assessment:      Patient:Jordan [name] (1955/12/06)  Reason for visit: Jordan [name] is a 61 y.o.male who returns for follow-up of bronchiectasis secondary to unknown cause (idiopathic) with Pseudomonas colonization.      Plan:      Problem List Items Addressed This Visit        Respiratory    Bronchiectasis (CMS-HCC) - Primary    Relevant Medications    HYDROcodone-homatropine (HYCODAN) 5-1.5 mg/5 mL syrup    budesonide-formoterol (SYMBICORT) 160-4.5 mcg/actuation inhaler    Other Relevant Orders    Nebulizers (DME)    AFB SMEAR (Completed)      Other Visit Diagnoses     Oral thrush            Reinforced importance of regular airway clearance.  In bronchiectasis, there is impaired mucus clearance from both the airway changes and from the underlying cause of the bronchiectasis.  This impaired clearance leads to accumulation of mucus, resulting in inflammation, accumulation of bacteria, and worsening damage to the airways.  Therefore, augmentation of airway clearance is critical. Jordan [name] was provided websites with information about bronchiectasis, social forums for patients with bronchiectasis and NTM, and airway clearance techniques    Patient met with the respiratory therapist today to discuss nebulizer equipment.  It is taking him 4 hours a day to nebulize albuterol, hypertonic saline, tobi or colistin (depending on the month). New nebulizer order was sent to local DME company. No changes were made to airway clearance regimen.  We discussed ordering a Vest for Jordan [name] but he would prefer to wait since his exacerbations have decreased with initation of azithromycin.    Patient spontaneously expectorated sputum that was sent for bacterial and AFB cultures. These results will help to guide antibiotic therapy in the future during exacerbations.  If he has future exacerbations, sputum should be sent for bacterial and AFB cultures. The bacterial culture should be processed like a cystic fibrosis sample given the overlapping pathogens. There are standing orders for these cultures in our system.    Sent script for nystatin to his local pharmacy.    Jordan [name] received the standard dose quadrivalent influenza vaccine this fall.     Jordan [name] will return to clinic in 3 months for follow-up with pre-bronchodilator spirometry and sputum cultures.  He will call or send me a message via MyChart if questions or concerns arise before this visit.      Subjective:      HPI: Jordan [name] is a 61 y.o. male who returns for follow-up of bronchiectasis secondary to unknown cause (idiopathic) with Pseudomonas colonization.      Since the last clinic visit on 07/21/16 when I started him on azithromycin as anti-inflammatory therapy:  ?? he has been treated for an exacerbation 0 times.  his last exacerbation was 06/10/16 when he was treated with Cipro 750 mg twice a day for 14 days.  ?? he has not been admitted for an exacerbation.  ?? he has not experienced hemoptysis.     ?? Cough is productive of yellow sputum. This is unchanged   ?? Wheezing and chest tightness are present but improved from last visit. Using rescue albuterol rarely.  ?? Pleurisy is absent.  ?? Switching to [name] on November 1.    For airway clearance, Jordan [name] is using Aerobika 2 times per day and Hypertonic Saline 7% 2 times per day.  Exercise capacity is  rated as good. MMRC of 2 = On level ground, I walk slower than people of the same age because of breathlessness or have to stop for breath when walking at my own pace for 15 minutes.  Jordan [name] is not on home oxygen therapy.    Review of Systems  Hoarseness attlributed to Advair (changed from [facility], Symbicort prior to that). His ENT (Jordan [name]) thinks that there is Candida related to Advair causing his hoarseness. Remainder of a complete review of systems was negative unless mentioned above.    Past Medical History:   Diagnosis Date   ??? Arthritis     back   ??? Biceps tendon tear 2013    left side   ??? Bronchiectasis (CMS-HCC)     chronic psuedomonas infection   ??? Degenerative joint disease of left acromioclavicular joint    ??? GERD (gastroesophageal reflux disease)    ??? Hypertension (RAF-HCC)    ??? Obstructive sleep apnea on CPAP 03/14/13    AHI 33.5, on BiPAP 12/8 based on sleep study 09/2013   ??? Pneumonia 2012    Lung abcess    ??? PONV (postoperative nausea and vomiting)    ??? Rotator cuff injury left   ??? Vertigo        Current Outpatient Prescriptions   Medication Sig Dispense Refill   ??? albuterol (PROAIR HFA) 90 mcg/actuation inhaler Inhale 2 puffs every four (4) hours as needed for wheezing or shortness of breath. 3 Inhaler 3   ??? albuterol 2.5 mg /3 mL (0.083 %) nebulizer solution Inhale 2.5 mg by nebulization Four (4) times a day.     ??? alcohol swabs PadM Use as directed with inhaled antibiotics 100 each 5   ??? ascorbic acid (CHEWABLE VITAMIN C) 250 mg Chew Chew 1,000 mg Two (2) times a day.      ??? azithromycin (ZITHROMAX) 250 MG tablet Take 1 tablet (250 mg total) by mouth daily. 30 tablet 11   ??? CIALIS 20 mg tablet      ??? colistimethate (COLYMYCIN) 150 mg injection Inhale 2 mL (150 mg total) Two (2) times a day. Inject 2mL SWFI into colistin vial, then withdraw 2mL (150mg ) for dose. 60 vial 5   ??? ferrous sulfate (IRON, FERROUS SULFATE,) 325 (65 FE) MG tablet Take 325 mg by mouth Two (2) times a day.     ??? fluticasone-salmeterol (ADVAIR HFA) 230-21 mcg/actuation inhaler Inhale 2 puffs Two (2) times a day. 1 Inhaler 11   ??? HYDROcodone-acetaminophen (NORCO) 7.5-325 mg per tablet Take 1 tablet by mouth three (3) times a day (at 6am, noon and 6pm).     ??? HYDROcodone-homatropine (HYCODAN) 5-1.5 mg/5 mL syrup Take 5 mL by mouth every four (4) hours as needed for cough. 120 mL 0   ??? mucus clearing device Devi Use Acapella twice daily for airway clearance. Dx. 494.0. 1 Device 0   ??? omeprazole (PRILOSEC) 20 MG capsule Take 20 mg by mouth Two (2) times a day.      ??? PARI LC D NEBULIZER Misc Provide 1 device  for use with inhaled medication. 1 each 11   ??? sodium chloride 7% 7 % Nebu Inhale 4 mL by nebulization 4 (four) times a day. 1440 mL 3   ??? sterile water, PF, Soln Use 2mL to mix colistin, add 2mL more to neb cup with 2mL of mixed colistin,nebulize twice a day 60 ampule 5   ??? syringe with needle (SYRINGE 3CC/20GX1) 3 mL 20 gauge x 1  Syrg For use with inhaled antibiotic 100 Syringe 5   ??? testosterone cypionate (DEPOTESTOTERONE CYPIONATE) 200 mg/mL injection Frequency:QOW   Dosage:0.0     Instructions:  Note:Dose: 200 MG/ML     ??? tobramycin, PF, ([name]) 300 mg/5 mL nebulizer solution Inhale 5 mL (300 mg total) by nebulization every twelve (12) hours. Use for 28 days every other month 280 mL 5     No current facility-administered medications for this visit.        Allergies  Reviewed on: 10/07/2016      Reactions Comment    Gentamicin Other (See Comments) BALANCE ISSUES    Tobramycin Other (See Comments) ototoxicity    Morphine Nausea And Vomiting         Social History     Social History   ??? Marital status: Married     Spouse name: Gavin Pound   ??? Number of children: 2   ??? Years of education: N/A     Occupational History   ???  Not Employed     Social History Main Topics   ??? Smoking status: Never Smoker   ??? Smokeless tobacco: Never Used   ??? Alcohol use No   ??? Drug use: No   ??? Sexual activity: Not on file     Other Topics Concern   ??? 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         Objective:   Objective   BP 142/87  - Pulse 83  - Ht 174 cm (5' 8.5)  - Wt 88.5 kg (195 lb)  - BMI 29.21 kg/m??   General Appearance:   Caucasian male appearing comfortable, non-toxic, in no distress, and stated age.   Eyes:  PERRL, conjunctiva clear, EOM's intact. No drainage.   Nose: Nares normal, septum midline. Normal mucosa.  No drainage or polyps.  No sinus tenderness.   Oropharynx: Moist mucus membranes without lesions or thrush.  Good dentition.  Posterior pharynx clear.   Respiratory:   Decreased breath sounds over right lower lobe consistent with prior resection. Crackles and bronchial breath sounds over left lower lobe. Easy work of breathing without accessory muscle use.     Cardiovascular:  Regular rate and rhythm. S1 and S2 normal. No murmur, rub  or gallop.  Pulses intact and symmetric.  No peripheral cyanosis or edema.   Musculoskeletal: No tenderness or deformity of the chest wall.  Joints normal.   Mild clubbing.   Skin: No rashes, lesions, skin changes.   Heme/Lymph: No cervical, supraclavicular, submandibular, or suprasternal adenopathy.  No bruising or petechiae.   Neurologic: Alert and oriented x3. No focal neurological deficits.  Normal gait.      Diagnostic Review:   Pulmonary Functions Testing Results:      Measures are consistent with severe airway obstruction. FVC reduced, likely reflects moderate restriction post thoracic surgery. FEV1 is stable and FVC is improved.       Cultures  Lab Results   Component Value Date    LABLORES 4+ Oropharyngeal Flora Isolated 07/21/2016    LABLORES 4+ Mucoid Pseudomonas aeruginosa (A) 07/21/2016    LABLORES 2+ Smooth Pseudomonas aeruginosa (A) 07/21/2016    ORG Smooth Pseudomonas aeruginosa (A) 05/25/2014    ORG Mucoid Pseudomonas aeruginosa (A) 05/25/2014    ORG Mould (A) 05/25/2014    AFBCX No Acid Fast Bacilli Detected 07/21/2016  AFBCX  03/24/2016     Overgrown with bacteria/fungus; unable to evaluate for acid fast bacilli.     Bronchiectasis Evaluation:  IgG with subclasses:   Lab Results   Component Value Date/Time    IGGT 996 04/28/2012 06:21 AM    IGG1 402 04/28/2012 06:21 AM    IGG2 399 04/28/2012 06:21 AM    IGG3 148.0 (H) 04/28/2012 06:21 AM    IGG4 47.7 04/28/2012 06:21 AM     IgA: No results found for: IGA  IgM: No results found for: IGM  IgE:   Lab Results   Component Value Date/Time    IGE 10.5 01/22/2016 09:53 PM     HIV: No results found for: HIVAGAB    CBC-D:   Lab Results   Component Value Date/Time    HGB 14.3 01/30/2016 03:55 AM    HGB 14.8 03/24/2015 05:31 PM    HCT 45.0 01/30/2016 03:55 AM    HCT 30.8 (L) 08/13/2013 03:57 AM    PLT 152 01/30/2016 03:55 AM    PLT 292 08/13/2013 03:57 AM    NEUTROABS 7.3 01/22/2016 09:53 PM    NEUTROABS 4.9 06/21/2013 09:31 PM    EOSABS 0.2 01/22/2016 09:53 PM    EOSABS 0.2 06/21/2013 09:31 PM     Alpha-1 level: No results found for: A1TRYP    CF testing: Sweat test   Lab Results   Component Value Date/Time    AMTLFT 195 06/17/2012 11:39 AM    AMTRT 254 06/17/2012 11:39 AM    SWCLL 6 06/17/2012 11:39 AM    SWCLR 7 06/17/2012 11:39 AM     Health Care Maintenance:  Most Recent Immunizations   Administered Date(s) Administered   ??? INFLUENZA TIV (TRI) 49MO+ W/ PRESERV (IM) 01/07/2012   ??? INFLUENZA TIV (TRI) PF (IM) 12/09/2012   ??? Influenza Vaccine Quad (IIV4 PF) 30mo+ injectable 01/31/2016   ??? Influenza Virus Vaccine, unspecified formulation 12/01/2016   ??? Influenza Whole 12/21/2008   ??? Pneumococcal Conjugate 13-Valent 04/28/2013   ??? Pneumococcal Polysaccharide 23 02/07/2016   ??? TdaP 03/22/2012

## 2016-12-29 NOTE — Unmapped (Signed)
Symptoms of a bronchiectasis exacerbation: 48 hours of at least three of the following:  ?? Increased cough  ?? Change in volume or appearance of sputum  ?? Increased sputum purulence  ?? Worsening shortness of breath and/or exercise tolerance  ?? Fatigue and/or malaise  ?? Coughing up blood (hemoptysis)    If you are experiencing some of these symptoms:  ?? Increase the frequency and/or intensity of your airway clearance  ?? Try to submit a sputum sample for bacterial and AFB cultures (at [facility] or locally)  ?? Reach out to me or your local physician.  ?? If you need antibiotics, recommend treating for 14 days.      Please bring any new airway clearance equipment to your next visit to ensure that you are using and caring for it properly.  Will hold on Vest for right now.    Nystatin for oral thrush sent to pharmacy.        Thank you for allowing me to be a part of your care. Please call the clinic with any questions.    Viona Gilmore, MD, MPH  Pulmonary and Critical Care Medicine  [address]  [id number]  [place], [place] [address]    Thank you for your visit to the [facility]monary Clinics. You may receive a survey from [facility] regarding your visit today, and we are eager to use this feedback to improve your experience. Thank you for taking the time to fill it out.    Between appointments, you can reach [place] at these numbers:    For appointments or the Pulmonary Nurse: [phone]  Fax: [phone]    For urgent issues after hours:  Hospital Operator: [phone], ask for Pulmonary Fellow on call    For further information, check out the websites below:    [facility] for Bronchiectasis: [email or web address]    World Bronchiectasis Conference in [place] Patient session October 03, 2016: Videos can be watched here.     Bronchiectasis Toolbox (information about airway clearance): DiningCalendar.de    Copy for Individuals with Bronchiectasis and/or NTM through the Bronchiectasis Registry and the COPD Foundation: https://www.mckee-young.org/    Information about Non-tuberculous Mycobacterial Infections:  https://www.ntminfo.org     Interested in clinical trials and other research opportunities?  www.clinicaltrials.gov  The Rare Diseases Clinical Research Network [facility]) Genetic Disorders of  Mucociliary Clearance Consortium [facility]) Contact Registry is a way for patients with disorders of mucociliary clearance (such as bronchiectasis) and their family members to learn about research studies they may be able to join. Participation is completely voluntary and you may choose to withdraw at any time. There is no cost to join the [employer]. For more information or to join the registry please go to the following website:  [email or web address]

## 2016-12-30 NOTE — Unmapped (Signed)
Sputum sample collected for AFB and Lower Resp Culture and taken to lab.

## 2016-12-30 NOTE — Unmapped (Addendum)
Respiratory Care Note:    Mr. Langenderfer stated that it is taking him 4 hours a day to nebulize his INH medications. Four times/day he nebulizes Albuterol and saline, and twice a day [name]. I asked him if he had been cleaning his neb cup after every treatment and he stated yes. I asked him how long he had, had his neb cup and nebulizer machine. He stated that he could not remember. We decided to get him a new neb machine and neb cup.

## 2017-01-05 NOTE — Unmapped (Signed)
Talked with [employer] today and they informed me that even though they know Jordan [name] chose not to pursue the getting the vest due to high co-pay, they had submitted his paper work to the financial aid office to see if they could get his co-pay written off. They have been trying to get a hold him because they need some more information to see if he qualifies. I sent Jordan [name] a My Chart message with the contact information.

## 2017-01-12 NOTE — Unmapped (Signed)
Faxed a prescription to [facility]* nebulizer compressor.

## 2017-01-15 DIAGNOSIS — J471 Bronchiectasis with (acute) exacerbation: Secondary | ICD-10-CM | POA: Diagnosis not present

## 2017-01-15 DIAGNOSIS — G4733 Obstructive sleep apnea (adult) (pediatric): Secondary | ICD-10-CM | POA: Diagnosis not present

## 2017-01-15 DIAGNOSIS — J479 Bronchiectasis, uncomplicated: Secondary | ICD-10-CM | POA: Diagnosis not present

## 2017-01-26 DIAGNOSIS — E291 Testicular hypofunction: Secondary | ICD-10-CM | POA: Diagnosis not present

## 2017-02-10 MED ORDER — CIPROFLOXACIN 750 MG TABLET
ORAL_TABLET | Freq: Two times a day (BID) | ORAL | 0 refills | 0.00000 days | Status: CP
Start: 2017-02-10 — End: 2017-04-06

## 2017-02-16 NOTE — Unmapped (Signed)
CLOSING TO CREATE NEW 02/16/17

## 2017-02-18 NOTE — Unmapped (Signed)
[facility] Refill Coordination Note  Specialty Medication(s): Colistin   Additional Medications shipped: all the stuff     Jordan [name], DOB: [birth year]  Phone: [phone] (home) , Alternate phone contact: N/A  Phone or address changes today?: No  All above HIPAA information was verified with patient.  Shipping Address: [address]  [place] [address]   Insurance changes? No    Completed refill call assessment today to schedule patient's medication shipment from the [facility] [phone]).      Confirmed the medication and dosage are correct and have not changed: Yes, regimen is correct and unchanged.    Confirmed patient started or stopped the following medications in the past month:  No, there are no changes reported at this time.    Are you tolerating your medication?:  Jordan [name] reports tolerating the medication.    ADHERENCE        Did you miss any doses in the past 4 weeks? No missed doses reported.    FINANCIAL/SHIPPING    Delivery Scheduled: Yes, Expected medication delivery date: 02/23/17     Jordan [name] did not have any additional questions at this time.    Delivery address validated in [facility] scheduling system: Yes, address listed in [facility] is correct.    We will follow up with patient monthly for standard refill processing and delivery.      Thank you,  Jordan [name]   [facility] Specialty Pharmacist

## 2017-02-22 MED FILL — STERILE WATER INJ//10ML: STERILE WATER INJ//10ML | 30 days supply | Qty: 60 | Fill #0

## 2017-02-22 MED FILL — COLISTIMETHATE SODIUM/150MG/SOLR: COLISTIMETHATE SODIUM/150MG/SOLR | 30 days supply | Qty: 60 | Fill #1

## 2017-02-22 MED FILL — PARI LC PLUS NEBULIZER SET//MISC: PARI LC PLUS NEBULIZER SET//MISC | 30 days supply | Qty: 1 | Fill #0

## 2017-02-22 MED FILL — ALCOHOL PADS//: ALCOHOL PADS// | 50 days supply | Qty: 1 | Fill #2

## 2017-02-22 MED FILL — NORMAL SALINE FLUSH/0.9%/SOLN: NORMAL SALINE FLUSH/0.9%/SOLN | 30 days supply | Qty: 60 | Fill #3

## 2017-02-22 MED FILL — BD 3ML LUER-LOK SYRINGE 2/20GX1.5/MISC: BD 3ML LUER-LOK SYRINGE 2/20GX1.5/MISC | 30 days supply | Qty: 60 | Fill #0

## 2017-03-01 MED ORDER — TOBRAMYCIN 300 MG/5 ML IN 0.225 % SODIUM CHLORIDE FOR NEBULIZATION
6 refills | 0 days | Status: CP
Start: 2017-03-01 — End: 2018-02-24

## 2017-03-03 DIAGNOSIS — E291 Testicular hypofunction: Secondary | ICD-10-CM | POA: Diagnosis not present

## 2017-03-04 DIAGNOSIS — D225 Melanocytic nevi of trunk: Secondary | ICD-10-CM | POA: Diagnosis not present

## 2017-03-04 DIAGNOSIS — C44519 Basal cell carcinoma of skin of other part of trunk: Secondary | ICD-10-CM | POA: Diagnosis not present

## 2017-03-04 DIAGNOSIS — L821 Other seborrheic keratosis: Secondary | ICD-10-CM | POA: Diagnosis not present

## 2017-03-04 DIAGNOSIS — L570 Actinic keratosis: Secondary | ICD-10-CM | POA: Diagnosis not present

## 2017-03-04 DIAGNOSIS — Z85828 Personal history of other malignant neoplasm of skin: Secondary | ICD-10-CM | POA: Diagnosis not present

## 2017-03-12 NOTE — Unmapped (Signed)
Called to confirm that the patient has received his nebulizer machine from [facility]. Unable to reach patient.     Phone-left voice mail.  Sending message via My Chart.

## 2017-03-19 DIAGNOSIS — E291 Testicular hypofunction: Secondary | ICD-10-CM | POA: Diagnosis not present

## 2017-03-19 MED ORDER — BUDESONIDE-FORMOTEROL HFA 160 MCG-4.5 MCG/ACTUATION AEROSOL INHALER
Freq: Two times a day (BID) | RESPIRATORY_TRACT | 1 refills | 0 days | Status: CP
Start: 2017-03-19 — End: 2018-04-25

## 2017-03-29 NOTE — Unmapped (Signed)
Called for refill. He does not need until end of month.  Rescheduled phone call for end of January to set up refill.  Jeffie Widdowson A Kristy Catoe  03/29/17

## 2017-04-02 DIAGNOSIS — E291 Testicular hypofunction: Secondary | ICD-10-CM | POA: Diagnosis not present

## 2017-04-05 NOTE — Unmapped (Signed)
[facility]  [facility]    Assessment:      Patient:Jordan [name] ([birth year])  Reason for visit: Jordan [name] is a 62 y.o.male who returns for follow-up of bronchiectasis secondary to unknown cause (idiopathic) with Pseudomonas colonization. He is reporting symptoms consistent with a bronchiectasis exacerbation after failing to improve with oral antibiotics.     Plan:      Problem List Items Addressed This Visit        Respiratory    Bronchiectasis (CMS-HCC)     Bronchiectasis Severity Index Score:    Age 87-69 = 2   BMI 18.5 or higher = 0   FEV1 % predicted 30-49% = 2   Hospitalized for severe exacerbation in past 2 years Yes = 5   Exacerbations in previous year 3 or more = 2   mMRC dyspnea score 2 = On level ground, I walk slower than people of the same age because of breathlessness or have to stop for breath when walking at my own pace for 15 minutes.   Chronic Pseudomonas colonization (at least 2 positive cultures at least 3 months apart within 1 year) Yes = 3   Colonization with other potential pathogenic bacteria (at least 2 positive cultures at least 3 months apart within 1 year) No = 0   Radiologic Severity 3 or more lobes or cystic brx = 1       Total Score 17     BSI score of 9+ = Severe bronchiectasis (1 year outcomes 7.6 - 10.5% mortality, 16.7 - 52.6% hospitalization rate; 4 year outcomes 9.9 - 29.2% mortality, 41.2 - 80.4% hospitalization)    [name] et al. The Bronchiectasis Severity Index: An International Derivation and Validation Study. AJRCCM, 2013; 189(5): 161-096.)         Relevant Medications    HYDROcodone-homatropine (HYCODAN) 5-1.5 mg/5 mL syrup    Other Relevant Orders    AFB SMEAR (Completed)       Other    Pseudomonas aeruginosa infection - Primary        Admission scheduled: 04/07/17 - aware that will likely have to wait for a bed to be available.    Antibiotics: Based on prior culture data (OPF, Mucoid and Smooth Pseudomonas) and allergy profile, favor treating with Zosyn 4.5 gm IV q6 and Ciprofloxacin 750 mg oral bid. Continue [name] nebs twice daily. Will need PICC line for at least 2 weeks of IV antibiotics.    Other medications: Start Pulmozyme 2.5 mg nebulized daily while in hospital. Do not discharge on this. Continue his home azithromycin 250 mg daily (anti-inflammatory agent).    Airway clearance: Needs aggressive airway clearance with hypertonic saline 7% nebulized 4 times a day, pre-treatment with albuterol. For mechanical clearance, prefer Aerobika and Vest four times per day. Okay to try Vibralung and Metaneb in hospital too.    Consults: PICC team.    Labs: CBC-D, BMP, Mag, CRP. Does not need blood cultures. Sputum cultures sent at clinic.    Imaging:  None needed based on today's clinic visit.    Other Testing: EKG on admission (chronic azithromycin use) and after a few days on Cipro.    Dispo: Patient does not need to remain in the hospital for the entire course of IV antibiotics. Patient does not need PFTs performed while in the hospital. Patient has a follow-up appointment with me scheduled for April.    I am available to assist the primary  team with his care during hospitalization.    I sent a refill of his hydrocodone syrup to his pharmacy to decrease his cough at night so he can get some rest.      No changes were made to his airway clearance regimen today. He is aware that he should increase the frequency and/or intensity during exacerbations.    He has already received his influenza vaccine this fall and is up to date on his pneumonia vaccines.    Bascom will return to clinic in 3 months for follow-up with pre-bronchodilator spirometry and sputum cultures.  He will call or send me a message via MyChart if questions or concerns arise before this visit.      Subjective:      HPI: Jordan [name] is a 62 y.o. male who returns for follow-up of bronchiectasis secondary to unknown cause (idiopathic) with Pseudomonas colonization. Since the last clinic visit on 12/29/16:  ?? he has been treated for an exacerbation 1 time.  his last exacerbation was 02/07/17 when he was treated with Cipro 750 mg twice a day for 14 days. Started feeling poorly in [place]. Felt better but not back to baseline after treatment.  Then worse.  He has been treated for a bronchiectasis exacerbation 3 times in the past year.  ?? he has not been admitted for an exacerbation.  His last hospitalization was 01/22/16.  ?? he has not experienced hemoptysis.     ?? Cough is productive of increased volume of brown sputum. Sometimes green. Thickened. This is worse. Worsening coughing spells. Post-tussive emesis or near post-tussive emesis.    ?? Wheezing present, notices when walk outside. Chest tightness is less present. Using rescue albuterol up to 5-6 times per day, when he was in [place] in late November. Waking up coughing but not needing albuterol but a few times.  ?? Pleurisy is absent. Less pain in chest. Had a couple of episodes of sharp pain in back on right side. Nothing on left side.  ?? Switched to [employer] on January 1.  Alternates with colistin.    For airway clearance, Jordan [name] is using Aerobika 2 times per day and Hypertonic Saline 7% 2 times per day.  Received new nebulizer which is working well for him. Has decreased time for treatments and is quieter.  Exercise capacity is rated as good. MMRC of 2 = On level ground, I walk slower than people of the same age because of breathlessness or have to stop for breath when walking at my own pace for 15 minutes.  Jordan [name] is not on home oxygen therapy.    Review of Systems  Feeling tired and decreased appetite. Remainder of a complete review of systems was negative unless mentioned above.    Past Medical History:   Diagnosis Date   ??? Abscess of lung (CMS-HCC) 11/06/2010    Overview:  See CT Chest 10/22/10 12/05/2010 right thoracotomy resection of right middle lobe and resection of right lower lobe abscess  Last Assessment & Plan: See CT Chest 10/22/10 12/05/2010 right thoracotomy resection of right middle lobe and resection of right lower lobe abscess  He is at risk of recurrent bronchiectasis/ abscess so ok to rx with prn augmentin cycles     ??? Arthritis     back   ??? Biceps tendon tear 2013    left side   ??? Bronchiectasis (CMS-HCC)     chronic psuedomonas infection   ??? Degenerative joint disease of left acromioclavicular joint    ??? GERD (  gastroesophageal reflux disease)    ??? Hypertension    ??? Obstructive sleep apnea on CPAP 03/14/13    AHI 33.5, on BiPAP 12/8 based on sleep study 09/2013   ??? Pneumonia 2012    Lung abcess    ??? PONV (postoperative nausea and vomiting)    ??? Rotator cuff injury left   ??? Vertigo      Current Outpatient Prescriptions   Medication Sig Dispense Refill   ??? albuterol (PROAIR HFA) 90 mcg/actuation inhaler Inhale 2 puffs every four (4) hours as needed for wheezing or shortness of breath. 3 Inhaler 3   ??? albuterol 2.5 mg /3 mL (0.083 %) nebulizer solution Inhale 2.5 mg by nebulization Four (4) times a day.     ??? alcohol swabs PadM Use as directed with inhaled antibiotics 100 each 5   ??? ascorbic acid (CHEWABLE VITAMIN C) 250 mg Chew Chew 1,000 mg Two (2) times a day.      ??? azithromycin (ZITHROMAX) 250 MG tablet Take 1 tablet (250 mg total) by mouth daily. 30 tablet 11   ??? budesonide-formoterol (SYMBICORT) 160-4.5 mcg/actuation inhaler Inhale 2 puffs Two (2) times a day. 3 Inhaler 1   ??? CIALIS 20 mg tablet      ??? colistimethate (COLYMYCIN) 150 mg injection Inhale 2 mL (150 mg total) Two (2) times a day. Inject 2mL SWFI into colistin vial, then withdraw 2mL (150mg ) for dose. 60 vial 5   ??? ferrous sulfate (IRON, FERROUS SULFATE,) 325 (65 FE) MG tablet Take 325 mg by mouth Two (2) times a day.     ??? HYDROcodone-acetaminophen (NORCO) 7.5-325 mg per tablet Take 1 tablet by mouth three (3) times a day (at 6am, noon and 6pm).     ??? HYDROcodone-homatropine (HYCODAN) 5-1.5 mg/5 mL syrup Take 5 mL by mouth every four (4) hours as needed for cough. 120 mL 0   ??? mucus clearing device Devi Use Acapella twice daily for airway clearance. Dx. 494.0. 1 Device 0   ??? omeprazole (PRILOSEC) 20 MG capsule Take 20 mg by mouth Two (2) times a day.      ??? PARI LC D NEBULIZER Misc Provide 1 device  for use with inhaled medication. 1 each 11   ??? sodium chloride 7% 7 % Nebu Inhale 4 mL by nebulization 4 (four) times a day. 1440 mL 3   ??? sterile water, PF, Soln Use 2mL to mix colistin, add 2mL more to neb cup with 2mL of mixed colistin,nebulize twice a day 60 ampule 5   ??? syringe with needle (SYRINGE 3CC/20GX1) 3 mL 20 gauge x 1 Syrg For use with inhaled antibiotic 100 Syringe 5   ??? testosterone cypionate (DEPOTESTOTERONE CYPIONATE) 200 mg/mL injection Frequency:QOW   Dosage:0.0     Instructions:  Note:Dose: 200 MG/ML     ??? tobramycin, PF, ([name]) 300 mg/5 mL nebulizer solution INHALE CONTENTS OF 1 AMP (300MG ) VIA NEBULIZER EVERY 12 HOURS FOR 28 DAYS ON, THEN 28 DAYS OFF. REFILL: [phone] 280 mL 6   ??? Lactobac no.41-Bifidobact no.7 (PROBIOTIC-10) 70 mg (3 billion cell) cap Take by mouth.       No current facility-administered medications for this visit.        Allergies  Reviewed on: 12/29/2016      Reactions Comment    Gentamicin Other (See Comments) BALANCE ISSUES    Tobramycin Other (See Comments) ototoxicity    Morphine Nausea And Vomiting         Social History  Social History   ??? Marital status: Married     Spouse name: Gavin Pound   ??? Number of children: 2   ??? Years of education: N/A     Occupational History   ???  Not Employed     Social History Main Topics   ??? Smoking status: Never Smoker   ??? Smokeless tobacco: Never Used   ??? Alcohol use No   ??? Drug use: No   ??? Sexual activity: Not on file     Other Topics Concern   ??? 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         Objective:   Objective   BP 136/76 (BP Site: L Arm, BP Position: Sitting, BP Cuff Size: Medium)  - Pulse 78  - Resp 14  - Wt 88.6 kg (195 lb 6.4 oz)  - SpO2 93%  - BMI 29.27 kg/m??   General Appearance:   Caucasian male appearing comfortable, non-toxic, in no distress, and stated age.   Eyes:  PERRL, conjunctiva clear, EOM's intact. No drainage.   Nose: Nares normal, septum midline. Normal mucosa.  No drainage or polyps.  No sinus tenderness.   Oropharynx: Moist mucus membranes without lesions or thrush.  Good dentition.  Posterior pharynx clear.   Respiratory:   Decreased breath sounds over right lower lobe consistent with prior resection. Crackles and bronchial breath sounds over left lower lobe. Easy work of breathing without accessory muscle use.     Cardiovascular:  Regular rate and rhythm. S1 and S2 normal. No murmur, rub  or gallop.  Pulses intact and symmetric.  No peripheral cyanosis or edema.   Musculoskeletal: No tenderness or deformity of the chest wall.  Joints normal.   Mild clubbing.   Skin: No rashes, lesions, skin changes.   Heme/Lymph: No cervical, supraclavicular, submandibular, or suprasternal adenopathy.  No bruising or petechiae.   Neurologic: Alert and oriented x3. No focal neurological deficits.  Normal gait.      Diagnostic Review:   Pulmonary Functions Testing Results:      Measures are consistent with severe airway obstruction. FVC reduced, likely reflects moderate restriction post thoracic surgery. Normal inspiratory limb. Measures are decreased as compared to 12/29/16.        Cultures  Lab Results   Component Value Date    LABLORES 4+ Oropharyngeal Flora Isolated 12/29/2016    LABLORES 3+ Mucoid Pseudomonas aeruginosa (A) 12/29/2016    LABLORES 3+ Smooth Pseudomonas aeruginosa (A) 12/29/2016    ORG Smooth Pseudomonas aeruginosa (A) 05/25/2014    ORG Mucoid Pseudomonas aeruginosa (A) 05/25/2014    ORG Mould (A) 05/25/2014    AFBCX Actinomadura species (A) 12/29/2016    AFBCX No Acid Fast Bacilli Detected 07/21/2016     Bronchiectasis Evaluation:  IgG with subclasses:   Lab Results   Component Value Date/Time    IGGT 996 04/28/2012 06:21 AM    IGG1 402 04/28/2012 06:21 AM    IGG2 399 04/28/2012 06:21 AM    IGG3 148.0 (H) 04/28/2012 06:21 AM    IGG4 47.7 04/28/2012 06:21 AM     IgA (09/21/11): 283   IgM (09/21/11): 52   IgE:   Lab Results   Component Value Date/Time    IGE 10.5 01/22/2016 09:53 PM     HIV: No results found for: HIVAGAB    CBC-D:   Lab  Results   Component Value Date/Time    HGB 14.3 01/30/2016 03:55 AM    HGB 14.8 03/24/2015 05:31 PM    HCT 45.0 01/30/2016 03:55 AM    HCT 30.8 (L) 08/13/2013 03:57 AM    PLT 152 01/30/2016 03:55 AM    PLT 292 08/13/2013 03:57 AM    NEUTROABS 7.3 01/22/2016 09:53 PM    NEUTROABS 4.9 06/21/2013 09:31 PM    EOSABS 0.2 01/22/2016 09:53 PM    EOSABS 0.2 06/21/2013 09:31 PM     Alpha-1-Antitrypsin (11/11/2010): MM genotype.    CF testing: Sweat test   Lab Results   Component Value Date/Time    AMTLFT 195 06/17/2012 11:39 AM    AMTRT 254 06/17/2012 11:39 AM    SWCLL 6 06/17/2012 11:39 AM    SWCLR 7 06/17/2012 11:39 AM     Health Care Maintenance:  Most Recent Immunizations   Administered Date(s) Administered   ??? INFLUENZA TIV (TRI) 65MO+ W/ PRESERV (IM) 01/07/2012   ??? INFLUENZA TIV (TRI) PF (IM) 12/09/2012   ??? Influenza Vaccine Quad (IIV4 PF) 36mo+ injectable 01/31/2016   ??? Influenza Virus Vaccine, unspecified formulation 12/01/2016   ??? Influenza Whole 12/21/2008   ??? PNEUMOCOCCAL POLYSACCHARIDE 23 02/07/2016   ??? Pneumococcal Conjugate 13-Valent 04/28/2013   ??? TdaP 03/22/2012

## 2017-04-06 ENCOUNTER — Encounter: Admit: 2017-04-06 | Discharge: 2017-04-06 | Payer: MEDICARE

## 2017-04-06 ENCOUNTER — Encounter: Admit: 2017-04-06 | Discharge: 2017-04-06 | Payer: MEDICARE | Attending: Internal Medicine | Primary: Internal Medicine

## 2017-04-06 DIAGNOSIS — J479 Bronchiectasis, uncomplicated: Secondary | ICD-10-CM | POA: Diagnosis not present

## 2017-04-06 DIAGNOSIS — J471 Bronchiectasis with (acute) exacerbation: Secondary | ICD-10-CM | POA: Diagnosis not present

## 2017-04-06 DIAGNOSIS — A498 Other bacterial infections of unspecified site: Secondary | ICD-10-CM | POA: Diagnosis not present

## 2017-04-06 NOTE — Unmapped (Signed)
Entered in error

## 2017-04-07 MED ORDER — HYDROCODONE-HOMATROPINE 5 MG-1.5 MG/5 ML ORAL SYRUP
ORAL | 0 refills | 0 days | Status: CP | PRN
Start: 2017-04-07 — End: 2017-09-29

## 2017-04-07 NOTE — Unmapped (Signed)
Symptoms of a bronchiectasis exacerbation: 48 hours of at least three of the following:  ?? Increased cough  ?? Change in volume or appearance of sputum  ?? Increased sputum purulence  ?? Worsening shortness of breath and/or exercise tolerance  ?? Fatigue and/or malaise  ?? Coughing up blood (hemoptysis)    If you are experiencing some of these symptoms:  ?? Increase the frequency and/or intensity of your airway clearance  ?? Try to submit a sputum sample for bacterial and AFB cultures (at [facility] or locally)  ?? Reach out to me or your local physician.  ?? If you need antibiotics, recommend treating for 14 days.      Please bring any new airway clearance equipment to your next visit to ensure that you are using and caring for it properly.    Winter Health Advisory:  ?? Wash hands frequently.  Hand sanitizer works well too.  ?? Try to avoid those with illness.  ?? Minimize touching your face.  ?? If traveling by plane, consider wearing a mask or using a sinus rinse after landing.  ?? Get the flu shot if you haven't already.  ?? Call if you feel problems getting into your chest--don't wait too late to call.  ?? Call if symptoms of the flu (sudden onset of high fever, body aches, severe fatigue) or exposed to someone with flu.  Want to start Tamiflu in 24-48 hours.      Thank you for allowing me to be a part of your care. Please call the clinic with any questions.    Viona Gilmore, MD, MPH  Pulmonary and Critical Care Medicine  [address]  [id number]  [place], [place] [address]    Thank you for your visit to the [facility] Pulmonary Clinics. You may receive a survey from [facility] regarding your visit today, and we are eager to use this feedback to improve your experience. Thank you for taking the time to fill it out.    Between appointments, you can reach [place] at these numbers:    For appointments or the Pulmonary Nurse: [phone]  Fax: [phone]    For urgent issues after hours:  Hospital Operator: [phone], ask for Pulmonary Fellow on call    For further information, check out the websites below:    [facility] for Bronchiectasis: [email or web address]    World Bronchiectasis Conference in [place] Patient session October 03, 2016: Videos can be watched here.     Bronchiectasis Toolbox (information about airway clearance): DiningCalendar.de    Copy for Individuals with Bronchiectasis and/or NTM through the Bronchiectasis Registry and the COPD Foundation: https://www.mckee-young.org/    Information about Non-tuberculous Mycobacterial Infections:  https://www.ntminfo.org     Interested in clinical trials and other research opportunities?  www.clinicaltrials.gov  The Rare Diseases Clinical Research Network [facility]) Genetic Disorders of  Mucociliary Clearance Consortium [facility]) Contact Registry is a way for patients with disorders of mucociliary clearance (such as bronchiectasis) and their family members to learn about research studies they may be able to join. Participation is completely voluntary and you may choose to withdraw at any time. There is no cost to join the [employer]. For more information or to join the registry please go to the following website:  [email or web address]

## 2017-04-07 NOTE — Unmapped (Signed)
Reviewed new home nebulizer machine with [name], discussed changing the filter on a regular basis due to the quantity of medication that [name] uses.  Mr. Bendorf will contact [facility] to obtain new filters.  No other issues or questions at this time.

## 2017-04-10 NOTE — Unmapped (Signed)
Bronchiectasis Severity Index Score:    Age 63-69 = 2   BMI 18.5 or higher = 0   FEV1 % predicted 30-49% = 2   Hospitalized for severe exacerbation in past 2 years Yes = 5   Exacerbations in previous year 3 or more = 2   mMRC dyspnea score 2 = On level ground, I walk slower than people of the same age because of breathlessness or have to stop for breath when walking at my own pace for 15 minutes.   Chronic Pseudomonas colonization (at least 2 positive cultures at least 3 months apart within 1 year) Yes = 3   Colonization with other potential pathogenic bacteria (at least 2 positive cultures at least 3 months apart within 1 year) No = 0   Radiologic Severity 3 or more lobes or cystic brx = 1       Total Score 17     BSI score of 9+ = Severe bronchiectasis (1 year outcomes 7.6 - 10.5% mortality, 16.7 - 52.6% hospitalization rate; 4 year outcomes 9.9 - 29.2% mortality, 41.2 - 80.4% hospitalization)    [name] et al. The Bronchiectasis Severity Index: An International Derivation and Validation Study. AJRCCM, 2013; 189(5): C339114.)

## 2017-04-13 NOTE — Unmapped (Signed)
[facility] Refill Coordination Note: Pulmonary         Jordan [name], DOB: [birth year]    Medication Adherence    Patient Reported X Missed Doses in the Last Month:  0  Specialty Medication:  COLISTIN   Patient is on additional specialty medications:  No  Informant:  patient  Confirmed Plan for Next Specialty Medication Refill:  delivery by pharmacy  Refills Needed for Supportive Medications:  not needed  Medication Assistance Program  Refill Coordination  Has the Patients' Contact Information Changed:  No    Is the Shipping Address Different:  No    Shipping Information  Delivery Scheduled:  Yes  Delivery Date:  04/16/17               -Confirmed the medication and dosage are correct and have not changed: Yes, regimen is correct and unchanged.             ADDITIONAL NOTES          NOTES ENTERED FOR BILLING PURPOSES ON EVERY CORRESPONDING RX ON PATIENTS PROFILE                     Ledora Bottcher

## 2017-04-14 ENCOUNTER — Ambulatory Visit: Admit: 2017-04-14 | Discharge: 2017-04-19 | Disposition: A | Discharge status: Home / Self Care | Payer: MEDICARE

## 2017-04-14 DIAGNOSIS — M545 Low back pain: Secondary | ICD-10-CM | POA: Diagnosis not present

## 2017-04-14 DIAGNOSIS — J471 Bronchiectasis with (acute) exacerbation: Secondary | ICD-10-CM | POA: Diagnosis not present

## 2017-04-14 DIAGNOSIS — K219 Gastro-esophageal reflux disease without esophagitis: Secondary | ICD-10-CM | POA: Diagnosis not present

## 2017-04-14 DIAGNOSIS — G4733 Obstructive sleep apnea (adult) (pediatric): Secondary | ICD-10-CM | POA: Diagnosis not present

## 2017-04-14 DIAGNOSIS — J470 Bronchiectasis with acute lower respiratory infection: Secondary | ICD-10-CM | POA: Diagnosis not present

## 2017-04-14 DIAGNOSIS — J219 Acute bronchiolitis, unspecified: Secondary | ICD-10-CM | POA: Diagnosis not present

## 2017-04-14 DIAGNOSIS — I499 Cardiac arrhythmia, unspecified: Secondary | ICD-10-CM | POA: Diagnosis not present

## 2017-04-14 DIAGNOSIS — M19012 Primary osteoarthritis, left shoulder: Secondary | ICD-10-CM | POA: Diagnosis not present

## 2017-04-14 LAB — C-REACTIVE PROTEIN: C reactive protein:MCnc:Pt:Ser/Plas:Qn:: 5.7

## 2017-04-14 LAB — CBC W/ AUTO DIFF
EOSINOPHILS ABSOLUTE COUNT: 0.2 10*9/L (ref 0.0–0.4)
HEMATOCRIT: 47.5 % (ref 41.0–53.0)
HEMOGLOBIN: 15.5 g/dL (ref 13.5–17.5)
LARGE UNSTAINED CELLS: 2 % (ref 0–4)
LYMPHOCYTES ABSOLUTE COUNT: 1.9 10*9/L (ref 1.5–5.0)
MEAN CORPUSCULAR HEMOGLOBIN CONC: 32.6 g/dL (ref 31.0–37.0)
MEAN CORPUSCULAR HEMOGLOBIN: 30.3 pg (ref 26.0–34.0)
MEAN CORPUSCULAR VOLUME: 92.8 fL (ref 80.0–100.0)
MEAN PLATELET VOLUME: 8 fL (ref 7.0–10.0)
MONOCYTES ABSOLUTE COUNT: 0.5 10*9/L (ref 0.2–0.8)
NEUTROPHILS ABSOLUTE COUNT: 4.7 10*9/L (ref 2.0–7.5)
PLATELET COUNT: 181 10*9/L (ref 150–440)
RED BLOOD CELL COUNT: 5.12 10*12/L (ref 4.50–5.90)
RED CELL DISTRIBUTION WIDTH: 13.1 % (ref 12.0–15.0)

## 2017-04-14 LAB — MAGNESIUM: Magnesium:MCnc:Pt:Ser/Plas:Qn:: 1.7

## 2017-04-14 LAB — COMPREHENSIVE METABOLIC PANEL
ALBUMIN: 4.1 g/dL (ref 3.5–5.0)
ALKALINE PHOSPHATASE: 73 U/L (ref 38–126)
ANION GAP: 11 mmol/L (ref 9–15)
AST (SGOT): 19 U/L (ref 19–55)
BILIRUBIN TOTAL: 0.6 mg/dL (ref 0.0–1.2)
BUN / CREAT RATIO: 25
CALCIUM: 8.9 mg/dL (ref 8.5–10.2)
CHLORIDE: 104 mmol/L (ref 98–107)
CO2: 25 mmol/L (ref 22.0–30.0)
CREATININE: 0.97 mg/dL (ref 0.70–1.30)
EGFR MDRD AF AMER: >=60 mL/min/{1.73_m2} (ref >=60)
EGFR MDRD NON AF AMER: >=60 mL/min/{1.73_m2} (ref >=60)
GLUCOSE RANDOM: 83 mg/dL (ref 65–179)
POTASSIUM: 4.3 mmol/L (ref 3.5–5.0)
PROTEIN TOTAL: 6.8 g/dL (ref 6.5–8.3)
SODIUM: 140 mmol/L (ref 135–145)

## 2017-04-14 LAB — PROTIME: Lab: 10.8

## 2017-04-14 LAB — ANION GAP: Anion gap 3:SCnc:Pt:Ser/Plas:Qn:: 11

## 2017-04-14 LAB — HEMATOCRIT: Lab: 47.5

## 2017-04-14 LAB — PROTIME-INR: INR: 0.95

## 2017-04-14 MED FILL — ALCOHOL PADS//: ALCOHOL PADS// | 50 days supply | Qty: 1 | Fill #3

## 2017-04-14 MED FILL — NORMAL SALINE FLUSH/0.9%/SOLN: NORMAL SALINE FLUSH/0.9%/SOLN | 30 days supply | Qty: 60 | Fill #4

## 2017-04-14 MED FILL — COLISTIMETHATE/150MG/INJ: COLISTIMETHATE/150MG/INJ | 30 days supply | Qty: 60 | Fill #2

## 2017-04-14 MED FILL — BD 3ML LUER-LOK SYRINGE 2/20GX1.5/MISC: BD 3ML LUER-LOK SYRINGE 2/20GX1.5/MISC | 30 days supply | Qty: 60 | Fill #1

## 2017-04-14 MED FILL — PARI LC PLUS NEBULIZER SET//MISC: PARI LC PLUS NEBULIZER SET//MISC | 30 days supply | Qty: 1 | Fill #1

## 2017-04-14 MED FILL — STERILE WATER INJ//10ML: STERILE WATER INJ//10ML | 30 days supply | Qty: 60 | Fill #1

## 2017-04-14 NOTE — Unmapped (Addendum)
Jordan [name] is a 62 y.o. male with bronchiectasis s/p lung resections, GERD??who presents with a bronchiectasis exacerbation.    Idiopathic Bronchiectasis with Acute Exacerbation: Patient reported cough with green/brown productive sputum, worsening shortness of breath, chest tightness on exertion, and fatigue since the end of October.  He was prescribed a course of ciprofloxacin 750 mg twice daily in November, however symptoms did not improve and thus he was directly admitted to the hospital for IV antibiotics. PFT's performed one week prior to admission with FEV1 43.9% down from 46.7% in October. His lower respiratory culture from 1/15 showed 4+ mucoid pseudomonas aeruginosa susceptible to Zosyn but resistant to Cipro. He was treated with Zosyn 4.5 mg IV q6h (1/22-) and started on cipro po BID per Dr. Garner Nash recommendations. He was also treated with HTS/albuterol nebs??QID, airway clearance, Symbicort BID, Pulmozyme daily (Dr. Garner Nash recommends this in the hospital only), Azithromycin 250 mg daily, andTobramycin nebs BID    GERD:??Continued home Prilosec    Chronic back pain: Patient's home Norco 7.5-325 was not available on formulary. Used Norco 5-325 PRN with increase to 2 tables as needed for severe pain during admission.

## 2017-04-15 LAB — BASIC METABOLIC PANEL
ANION GAP: 10 mmol/L (ref 9–15)
BLOOD UREA NITROGEN: 24 mg/dL — ABNORMAL HIGH (ref 7–21)
BUN / CREAT RATIO: 22
CALCIUM: 8.9 mg/dL (ref 8.5–10.2)
CHLORIDE: 103 mmol/L (ref 98–107)
CO2: 27 mmol/L (ref 22.0–30.0)
CREATININE: 1.1 mg/dL (ref 0.70–1.30)
EGFR MDRD AF AMER: >=60 mL/min/{1.73_m2} (ref >=60)
EGFR MDRD NON AF AMER: >=60 mL/min/{1.73_m2} (ref >=60)
GLUCOSE RANDOM: 85 mg/dL (ref 65–179)
POTASSIUM: 4.4 mmol/L (ref 3.5–5.0)

## 2017-04-15 LAB — MAGNESIUM: Magnesium:MCnc:Pt:Ser/Plas:Qn:: 1.7

## 2017-04-15 LAB — CBC W/ AUTO DIFF
BASOPHILS ABSOLUTE COUNT: 0 10*9/L (ref 0.0–0.1)
EOSINOPHILS ABSOLUTE COUNT: 0.2 10*9/L (ref 0.0–0.4)
HEMATOCRIT: 45.6 % (ref 41.0–53.0)
LYMPHOCYTES ABSOLUTE COUNT: 1.7 10*9/L (ref 1.5–5.0)
MEAN CORPUSCULAR HEMOGLOBIN CONC: 33.1 g/dL (ref 31.0–37.0)
MEAN CORPUSCULAR HEMOGLOBIN: 30.8 pg (ref 26.0–34.0)
MEAN CORPUSCULAR VOLUME: 93.2 fL (ref 80.0–100.0)
MONOCYTES ABSOLUTE COUNT: 0.6 10*9/L (ref 0.2–0.8)
NEUTROPHILS ABSOLUTE COUNT: 3.8 10*9/L (ref 2.0–7.5)
PLATELET COUNT: 175 10*9/L (ref 150–440)
RED BLOOD CELL COUNT: 4.89 10*12/L (ref 4.50–5.90)
RED CELL DISTRIBUTION WIDTH: 13.1 % (ref 12.0–15.0)

## 2017-04-15 LAB — RED BLOOD CELL COUNT: Lab: 4.89

## 2017-04-15 LAB — GLUCOSE RANDOM: Glucose:MCnc:Pt:Ser/Plas:Qn:: 85

## 2017-04-15 NOTE — Unmapped (Signed)
General Medicine History and Physical    Assessment/Plan:    Principal Problem:    Bronchiectasis (CMS-HCC)  Active Problems:    Gastroesophageal reflux disease without esophagitis    TALIN FEISTER is a 62 y.o. gentleman with bronchiectasis s/p lung resections, GERD who presents with a bronchiectasis exacerbation.    Idiopathic Bronchiectasis with Acute Exacerbation: Symptoms consistent with exacerbation, including shortness of breath, productive cough, chest tightness since October. He was prescribed Ciprofloxacin 750 mg q12h at that time with lack of significant improvement. FEV1 in clinic last week was decreased to 43.9% from prior 46.7% in October. Due to persistent symptoms he was admitted for IV antibiotics. Previous sputum cultures have grown mucoid and smooth PsA, intermittently resistant to Cipro. The lower respiratory culture from his clinic visit 1/15 is growing mucoid PsA resistant to Cipro but sensitive to Zosyn.  - Obtain CBC/d, CMP, CRP, PT/INR  - Obtain EKG for QTc  - HTS/albuterol nebs QID  - Airway clearance: vest and aerobika QID  - Symbicort BID  - Pulmozyme daily (Dr. Garner Nash recommends this in the hospital only)  - Azithromycin 250 mg daily  - Tobramycin nebs BID  - Start Zosyn 4.5 mg IV q6h  - Will not start cipro tonight in setting of resistant bacteria    GERD: Home Prilosec    FEN/Ppx  - no IVF  - replete lytes PRN  - general diet  - DVT: Lovenox  - GI: home PPI    Code Status:  Full Code    Dispo: Med G, Floor status  ___________________________________________________________________    Chief Complaint  Bronchiectasis Exacerbation    HPI:  YAMIL OELKE is a 62 y.o. gentleman with bronchiectasis s/p lung resections, GERD who presents with a bronchiectasis exacerbation. He is followed in clinic by Dr. Garner Nash.  He reports that he has had symptoms consistent with a bronchiectasis exacerbation, including cough with green/brown productive sputum, worsening shortness of breath, chest tightness on exertion, and fatigue since the end of October.  He was prescribed a course of ciprofloxacin 750 mg twice daily in November, however symptoms did not improve and thus he was directly admitted to the hospital for IV antibiotics.    His bronchiectasis is idiopathic with pseudomonas colonization, he reports on average annual admission for IV antibiotics, most recently in 01/2016 which was treated with Zosyn/Cipro. He was given a burst of Predisone 20 mg x 7 days.     PFT's last performed one week ago with FEV1 43.9% down from 46.7% in October.    Allergies:  Gentamicin; Tobramycin; and Morphine    Medications:   Prior to Admission medications    Medication Dose, Route, Frequency   albuterol (PROAIR HFA) 90 mcg/actuation inhaler 2 puffs, Inhalation, Every 4 hours PRN   albuterol 2.5 mg /3 mL (0.083 %) nebulizer solution 2.5 mg, Nebulization, 4 times a day   ascorbic acid (CHEWABLE VITAMIN C) 250 mg Chew 1,000 mg, Oral, 2 times a day   azithromycin (ZITHROMAX) 250 MG tablet 250 mg, Oral, Daily (standard)   budesonide-formoterol (SYMBICORT) 160-4.5 mcg/actuation inhaler 2 puffs, Inhalation, 2 times a day (standard)   colistimethate (COLYMYCIN) 150 mg injection 150 mg, Inhalation, 2 times a day (standard), Inject 2mL SWFI into colistin vial, then withdraw 2mL (150mg ) for dose.   ferrous sulfate (IRON, FERROUS SULFATE,) 325 (65 FE) MG tablet 325 mg, Oral, 2 times a day   HYDROcodone-acetaminophen (NORCO) 7.5-325 mg per tablet 1 tablet, Oral, 3 times a day  HYDROcodone-homatropine (HYCODAN) 5-1.5 mg/5 mL syrup 5 mL, Oral, Every 4 hours PRN   Lactobac no.41-Bifidobact no.7 (PROBIOTIC-10) 70 mg (3 billion cell) cap Oral   omeprazole (PRILOSEC) 20 MG capsule 20 mg, Oral, 2 times a day   sodium chloride 7% 7 % Nebu 4 mL, Nebulization, 4 times daily (RT)   testosterone cypionate (DEPOTESTOTERONE CYPIONATE) 200 mg/mL injection Frequency:QOW   Dosage:0.0     Instructions:  Note:Dose: 200 MG/ML   tobramycin, PF, ([name]) 300 mg/5 mL nebulizer solution INHALE CONTENTS OF 1 AMP (300MG ) VIA NEBULIZER EVERY 12 HOURS FOR 28 DAYS ON, THEN 28 DAYS OFF. REFILL: [phone]   alcohol swabs PadM Use as directed with inhaled antibiotics   CIALIS 20 mg tablet No dose, route, or frequency recorded.   mucus clearing device Devi Use Acapella twice daily for airway clearance. Dx. 494.0.   PARI LC D NEBULIZER Misc Provide 1 device  for use with inhaled medication.   sterile water, PF, Soln Use 2mL to mix colistin, add 2mL more to neb cup with 2mL of mixed colistin,nebulize twice a day   syringe with needle (SYRINGE 3CC/20GX1) 3 mL 20 gauge x 1 Syrg For use with inhaled antibiotic       Medical History:  Past Medical History:   Diagnosis Date   ??? Abscess of lung (CMS-HCC) 11/06/2010    Overview:  See CT Chest 10/22/10 12/05/2010 right thoracotomy resection of right middle lobe and resection of right lower lobe abscess  Last Assessment & Plan:  See CT Chest 10/22/10 12/05/2010 right thoracotomy resection of right middle lobe and resection of right lower lobe abscess  He is at risk of recurrent bronchiectasis/ abscess so ok to rx with prn augmentin cycles     ??? Arthritis     back   ??? Biceps tendon tear 2013    left side   ??? Bronchiectasis (CMS-HCC)     chronic psuedomonas infection   ??? Degenerative joint disease of left acromioclavicular joint    ??? GERD (gastroesophageal reflux disease)    ??? Hypertension    ??? Obstructive sleep apnea on CPAP 03/14/13    AHI 33.5, on BiPAP 12/8 based on sleep study 09/2013   ??? Pneumonia 2012    Lung abcess    ??? PONV (postoperative nausea and vomiting)    ??? Rotator cuff injury left   ??? Vertigo        Surgical History:  Past Surgical History:   Procedure Laterality Date   ??? APPENDECTOMY  1969   ??? BACK SURGERY     ??? BRONCHOSCOPY  10/19/2010    [facility]   ??? CARPAL TUNNEL RELEASE  1999   ??? LUNG REMOVAL, PARTIAL Right 10/2010    RML and RLL partially resected   ??? MENISCECTOMY  2011   ??? NECK SURGERY  1993, 1999, 2002   ??? PR GERD TST W/ MUCOS IMPEDE ELECTROD,>1HR N/A 07/06/2012    Procedure: ESOPHAGEAL FUNCTION TEST, GASTROESOPHAGEAL REFLUX TEST W/ NASAL CATHETER INTRALUMINAL IMPEDANCE ELECTRODE(S) PLACEMENT, RECORDING, ANALYSIS AND INTERPRETATION; PROLONGED;  Surgeon: None None;  [facility];  Service: Gastroenterology   ??? PR OPEN TREAT RIB FRACTURE W/INT FIXATION, UNILATERAL, 1-2 RIBS Right 03/19/2015    Procedure: OPEN TREATMENT OF RIB FRACTURE REQUIRING INTERNAL FIXATION, UNILATERAL; 1-2 RIBS;  Surgeon: Evert Kohl, MD;  Location: MAIN OR [facility];  Service: Cardiothoracic   ??? PR THORACOTOMY W/THERAP WEDGE RESEXN ADDL IPSILATRL Right 08/07/2013    Procedure: THORACOTOMY; WITH THERAPEUTIC LOBECTOMY  OF RIGHT MIDDLE AND RIGHT LOWER LOBE RESECTION , EACH ADDITIONAL RESECTION, IPSILATERAL;  Surgeon: Alvester Chou, MD;  Location: MA[facility] OR [facility];  Service: Cardiothoracic   ??? TONSILLECTOMY  1965     Social History:  Tobacco use:   reports that he has never smoked. He has never used smokeless tobacco.  Alcohol use:   reports that he does not drink alcohol.  Drug use:  reports that he does not use drugs.  Living situation: the patient lives with their spouse.    Family History:  Family History   Problem Relation Age of Onset   ??? Alzheimer's disease Mother    ??? Stroke Mother    ??? Bronchiectasis  Brother         presumed   ??? Bronchiectasis  Brother         presumed   ??? Liver disease Father    ??? Diabetes Father    ??? Heart failure Father    ??? Bronchiectasis  Daughter    ??? Asthma Son         childhood asthma   ??? Clotting disorder Neg Hx    ??? Anesthesia problems Neg Hx      Review of Systems:  10 systems reviewed and are negative unless otherwise mentioned in HPI    Physical Exam:  Temp:  [36.8 ??C] 36.8 ??C  Heart Rate:  [72-82] 72  Resp:  [18] 18  BP: (133)/(85) 133/85  SpO2:  [93 %-94 %] 94 %  Body mass index is 28.54 kg/m??.    General: Well appearing, alert, conversant, cooperative, and in no distress.  Head: normocephalic, atraumatic  Eyes: No scleral icterus. PERRL.  ENT:  Oropharyngeal mucus membranes moist & pink.  Neck: No lymphadenopathy.  Cardiovascular: Regular rate and rhythm, normal S1 and S2. No murmur appreciated.  Respiratory: Normal respiratory effort on room air. Crackles and rhonchi throughout left posterior lungs fields; scattered faint, expiratory wheezes  Gastrointestinal: Soft, non-tender, non-distended with normoactive bowel sounds.  Neurologic: Alert and fully oriented. Normal speech and language. Moving all extremities purposefully. Normal gait.  Extremities: Warm to touch. Regular 2+ radial pulses bilaterally. No LE edema. Digital clubbing present.  Skin: Skin is warm, dry and intact. No rash.  Psychiatric: Mood and affect are normal. Speech and behavior are normal.    Test Results:  Data Review: labs ordered  EKG: EKG ordered

## 2017-04-15 NOTE — Unmapped (Signed)
Problem: Breathing Pattern Ineffective (Adult)  Goal: Identify Related Risk Factors and Signs and Symptoms  Related risk factors and signs and symptoms are identified upon initiation of Human Response Clinical Practice Guideline (CPG).  Patient is on room air and using an [place] for airway clearance.  He performed all inhaled respiratory medications without complication.  Will continue to monitor.

## 2017-04-15 NOTE — Unmapped (Signed)
Care Management  Initial Transition Planning Assessment              General  Care Manager assessed the patient by : Medical record review, Discussion with Clinical Care team  Orientation Level: Oriented X4  Who provides care at home?: N/A    Contact/Decision Maker:    Contact Details  Contact Details: Primary Contact  Primary Contact Name: Sher Hellinger  Primary Contact Relationship: Spouse  Phone #1: [phone]    Advance Directive (Medical Treatment)  Does patient have an advance directive covering medical treatment?: Patient does not have advance directive covering medical treatment.  Reason patient does not have an advance directive covering medical treatment:: Patient does not wish to complete one at this time  Surrogate decision maker appointed:: No, Other (Comment) (Patient's mother is Product manager)  Surrogate decision maker's name:: Deboarah Marga Hoots  Surrogate decision maker's phone number:: [phone]  Information provided on advance directive:: No  Patient requests assistance:: No    Advance Directive (Mental Health Treatment)  Does patient have an advance directive covering mental health treatment?: Patient does not have advance directive covering mental health treatment.  Reason patient does not have an advance directive covering mental health treatment:: Patient does not wish to complete one at this time.    Patient Information:    Lives with: Spouse/significant other    Type of Residence: Private residence       Type of Residence: Mailing Address:  [address]  [place] [address]  Contacts: Accompanied by: Significant other  Contact Details: Primary Contact  Primary Contact Name: Anderson Middlebrooks  Primary Contact Relationship: Spouse  Phone #1: [phone]  Patient Phone Number: [phone] (home)       Medical Provider(s): Barbaraann Boys, MD  Reason for Admission: Admitting Diagnosis:  bronchiectasis exacerbation  Past Medical History:   has a past medical history of Abscess of lung (CMS-HCC) (11/06/2010); Arthritis; Biceps tendon tear (2013); Bronchiectasis (CMS-HCC); Degenerative joint disease of left acromioclavicular joint; GERD (gastroesophageal reflux disease); Hypertension; Obstructive sleep apnea on CPAP (03/14/13); Pneumonia (2012); PONV (postoperative nausea and vomiting); Rotator cuff injury (left); and Vertigo.  Past Surgical History:   has a past surgical history that includes Back surgery; Tonsillectomy (1965); Appendectomy (1969); pr gerd tst w/ mucos impede electrod,>1hr (N/A, 07/06/2012); Lung removal, partial (Right, 10/2010); Bronchoscopy (10/19/2010); Neck surgery (1993, 1999, 2002); Carpal tunnel release (1999); Meniscectomy (2011); pr thoracotomy w/therap wedge resexn addl ipsilatrl (Right, 08/07/2013); and pr open treat rib fracture w/int fixation, unilateral, 1-2 ribs (Right, 03/19/2015).   Previous admit date: 01/22/2016    Primary Insurance- Payor: MEDICARE / Plan: MEDICARE PART A ONLY / Product Type: *No Product type* /   Secondary Insurance ??? Secondary Insurance  Restaurant manager, fast food  Preferred Pharmacy - [facility] [place], [place] - [address]  [facility] [place], [place] - [address] AT [address] TO [address]ED [address]  [facility] - [place], [place] - [address]  [facility] [place], [place] - [address]  [facility] - [place], [place] - [address]  [id number] [place], [place] - [address] AT [address]    Transportation home: Private vehicle  Level of function prior to admission: Independent    Support Systems: Significant Other    Responsibilities/Dependents at home?: No    Home Care services in place prior to admission?: No  Equipment Currently Used at Home: respiratory supplies  Current HME Agency (Name/Phone #): bi-pap from [facility], also has nebulizer    Currently receiving outpatient dialysis?: No     Financial Information:     Patient source of income: SSDI, will have retirement from [employer]    Need for financial assistance?: No     Discharge Needs Assessment:    Concerns to be Addressed: denies needs/concerns at this time, care coordination/care conferences    Clinical Risk Factors: Multiple Diagnoses (Chronic)    Barriers to taking medications: No    Prior overnight hospital stay or ED visit in last 90 days: No    Readmission Within the Last 30 Days: no previous admission in last 30 days    Anticipated Changes Related to Illness: none    Equipment Needed After Discharge: none    Discharge Facility/Level of Care Needs:  (discharge home, possibly w HI)    Patient at risk for readmission?: Yes    Discharge Plan:    Screen findings are: Discharge planning needs identified or anticipated (Comment).    Expected Discharge Date: 04/19/17    Expected Transfer from Critical Care:      Patient and/or family were provided with choice of facilities / services that are available and appropriate to meet post hospital care needs?: Yes   List choices in order highest to lowest preferred, if applicable. : [place]    Initial Assessment complete?: Yes

## 2017-04-15 NOTE — Unmapped (Signed)
Problem: Patient Care Overview  Goal: Plan of Care Review  Outcome: Progressing  Patient admitted for bronchiectasis exacerbation.  Plan to place PICC line and go home with IV antibiotics.  No concerns noted.  VS WNL. No falls or injuries for this shift.  Continue POC.

## 2017-04-16 LAB — MAGNESIUM: Magnesium:MCnc:Pt:Ser/Plas:Qn:: 1.7

## 2017-04-16 LAB — CBC W/ AUTO DIFF
BASOPHILS ABSOLUTE COUNT: 0 10*9/L (ref 0.0–0.1)
EOSINOPHILS ABSOLUTE COUNT: 0.2 10*9/L (ref 0.0–0.4)
HEMATOCRIT: 44 % (ref 41.0–53.0)
HEMOGLOBIN: 14.8 g/dL (ref 13.5–17.5)
LARGE UNSTAINED CELLS: 2 % (ref 0–4)
LYMPHOCYTES ABSOLUTE COUNT: 1.4 10*9/L — ABNORMAL LOW (ref 1.5–5.0)
MEAN CORPUSCULAR HEMOGLOBIN CONC: 33.6 g/dL (ref 31.0–37.0)
MEAN CORPUSCULAR HEMOGLOBIN: 31.6 pg (ref 26.0–34.0)
MEAN PLATELET VOLUME: 8.5 fL (ref 7.0–10.0)
MONOCYTES ABSOLUTE COUNT: 0.5 10*9/L (ref 0.2–0.8)
NEUTROPHILS ABSOLUTE COUNT: 4 10*9/L (ref 2.0–7.5)
RED BLOOD CELL COUNT: 4.69 10*12/L (ref 4.50–5.90)
WBC ADJUSTED: 6.2 10*9/L (ref 4.5–11.0)

## 2017-04-16 LAB — EGFR MDRD AF AMER: Glomerular filtration rate/1.73 sq M.predicted.black:ArVRat:Pt:Ser/Plas/Bld:Qn:Creatinine-based formula (MDRD): >=60

## 2017-04-16 LAB — BASIC METABOLIC PANEL
ANION GAP: 9 mmol/L (ref 9–15)
BLOOD UREA NITROGEN: 18 mg/dL (ref 7–21)
BUN / CREAT RATIO: 16
CALCIUM: 9.2 mg/dL (ref 8.5–10.2)
CHLORIDE: 107 mmol/L (ref 98–107)
CO2: 25 mmol/L (ref 22.0–30.0)
CREATININE: 1.11 mg/dL (ref 0.70–1.30)
EGFR MDRD NON AF AMER: >=60 mL/min/{1.73_m2} (ref >=60)
GLUCOSE RANDOM: 92 mg/dL (ref 65–179)
POTASSIUM: 4.5 mmol/L (ref 3.5–5.0)
SODIUM: 141 mmol/L (ref 135–145)

## 2017-04-16 LAB — EOSINOPHILS ABSOLUTE COUNT: Lab: 0.2

## 2017-04-16 NOTE — Unmapped (Signed)
[facility] Liaison Note: Met with patient at bedside to discuss home infusion via the [employer] for Zosyn 4.5gms q6hours. Hands-on instruction and teach to independence using the SASH method of flush performed with patient with successful teach back. Emphasized aseptic technique with patient. [facility] agency will see the patient for ROC. SL PICC placed 04/15/17 is clean and intact. Good support at home. Confirmed address and phone numbers.

## 2017-04-16 NOTE — Unmapped (Signed)
Problem: Breathing Pattern Ineffective (Adult)  Goal: Identify Related Risk Factors and Signs and Symptoms  Related risk factors and signs and symptoms are identified upon initiation of Human Response Clinical Practice Guideline (CPG).   Outcome: Progressing  Patient stable this shift.

## 2017-04-16 NOTE — Unmapped (Signed)
Medicine Daily Progress Note    Assessment/Plan:  Principal Problem:    Bronchiectasis (CMS-HCC)  Active Problems:    Gastroesophageal reflux disease without esophagitis  Resolved Problems:    * No resolved hospital problems. *           Jordan [name] is a 62 y.o. male with bronchiectasis s/p lung resections, GERD who presents with a bronchiectasis exacerbation.    Idiopathic Bronchiectasis with Acute Exacerbation: Symptoms consistent with exacerbation, including shortness of breath, productive cough, chest tightness since October. He was prescribed Ciprofloxacin 750 mg q12h at that time with lack of significant improvement. FEV1 in clinic last week was decreased to 43.9% from prior 46.7% in October. Previous sputum cultures have grown mucoid and smooth PsA, intermittently resistant to Cipro. The lower respiratory culture from his clinic visit 1/15 is growing mucoid PsA resistant to Cipro but sensitive to Zosyn. CRP 5.7, no leukocytosis. QTc 445.   - Cont Zosyn 4.5 mg IV q6h (1/22-) and start cipro po BID per Dr. Garner Nash recommendations  - HTS/albuterol nebs QID  - Airway clearance: vest and aerobika QID  - Symbicort BID  - Pulmozyme daily (Dr. Garner Nash recommends this in the hospital only)  - Azithromycin 250 mg daily  - Tobramycin nebs BID  ??  GERD: Home Prilosec  ??  FEN/Ppx  - no IVF  - replete lytes PRN  - general diet  - DVT: Lovenox  - GI: home PPI  ??  Code Status:  Full Code  ??  Dispo: Med G, Floor status  ___________________________________________________________________    Subjective:  No acute events overnight. Patient remains afebrile. Complaining of his chronic back pain and continued cough. Sating well on room air.     Labs/Studies:  Labs and Studies from the last 24hrs per EMR and Reviewed    Pressure Ulcer(s)    Active Pressure Ulcer     None                Objective:  Temp:  [36.5 ??C-36.8 ??C] 36.6 ??C  Heart Rate:  [72-82] 76  Resp:  [18] 18  BP: (122-133)/(62-85) 122/76  SpO2:  [93 %-95 %] 95 % General: Well appearing, alert, conversant, no distress.  Head: normocephalic, atraumatic  ENT:  Oropharyngeal mucus membranes moist & pink.  Cardiovascular: Regular rate and rhythm, normal S1 and S2. No murmur appreciated.  Respiratory: Normal respiratory effort??on room air. Crackles and rhonchi throughout left posterior lungs fields, rhonchi noted throughout right lung with slightly decreased lung sounds consistent with prior surgeries   Gastrointestinal: Soft, non-tender, non-distended with normoactive bowel sounds.  Extremities: ??No LE edema. Digital clubbing present.  Skin: Skin is warm, dry and intact. No rash.  Psychiatric:??Mood and affect??are normal. Speech and behavior are normal.

## 2017-04-16 NOTE — Unmapped (Signed)
Problem: Patient Care Overview  Goal: Plan of Care Review  Outcome: Progressing  Alert and oriented x 3, independent with adls, no falls and no injuries,  with chronic pain and pain meds help per pt, given heating pad and a chair pad for hip pain, took breathing meds, denies dyspnea, tolerating iv abt, will cont plan of care.  Goal: Individualization and Mutuality  Outcome: Progressing    Goal: Discharge Needs Assessment  Outcome: Progressing   04/16/17 1550   OTHER   Equipment Currently Used at Home respiratory supplies   Patient and/or family were provided with choice of facilities / services that are available and appropriate to meet post hospital care needs? N/A   Discharge Needs Assessment   Concerns to be Addressed denies needs/concerns at this time   Readmission Within the Last 30 Days no previous admission in last 30 days   Patient/Family Anticipates Transition to home with help/services   Patient/Family Anticipated Services at Transition home health care   Transportation Anticipated family or friend will provide   Anticipated Changes Related to Illness none   Equipment Needed After Discharge medication pump   Discharge Facility/Level of Care Needs other (see comments)  (tbd)   Current Discharge Risk chronically ill     Goal: Interprofessional Rounds/Family Conf  Outcome: Progressing   04/16/17 1550   Interdisciplinary Rounds/Family Conf   Participants nursing;patient;physician       Problem: Breathing Pattern Ineffective (Adult)  Goal: Identify Related Risk Factors and Signs and Symptoms  Related risk factors and signs and symptoms are identified upon initiation of Human Response Clinical Practice Guideline (CPG).   Outcome: Progressing   04/16/17 1550   Breathing Pattern Ineffective (Adult)   Related Risk Factors (Breathing Pattern Ineffective) underlying condition   Signs and Symptoms (Breathing Pattern Ineffective) breathing pattern altered       Problem: Infection, Risk/Actual (Adult)  Goal: Infection Prevention/Resolution  Patient will demonstrate the desired outcomes by discharge/transition of care.   Outcome: Progressing   04/16/17 1550   Infection, Risk/Actual (Adult)   Infection Prevention/Resolution making progress toward outcome

## 2017-04-16 NOTE — Unmapped (Signed)
Problem: Patient Care Overview  Goal: Plan of Care Review  Outcome: Progressing  No falls or injuries for this shift.  VS WNL.  PICC placed late on day shift, tubing changed and PIV removed.  No concerns noted.  Continue POC.

## 2017-04-16 NOTE — Unmapped (Signed)
Problem: Patient Care Overview  Goal: Plan of Care Review  Outcome: Progressing  Pt A&Ox4. VSS. Pt up in chair during day. PRN hydrocodone given for chronic pain. Medication effective per pt report. PICC line placed. No further needs assess. POC continues.   Goal: Individualization and Mutuality  Outcome: Progressing    Goal: Discharge Needs Assessment  Outcome: Progressing    Goal: Interprofessional Rounds/Family Conf  Outcome: Progressing      Problem: Breathing Pattern Ineffective (Adult)  Goal: Identify Related Risk Factors and Signs and Symptoms  Related risk factors and signs and symptoms are identified upon initiation of Human Response Clinical Practice Guideline (CPG).   Outcome: Progressing      Problem: Infection, Risk/Actual (Adult)  Goal: Identify Related Risk Factors and Signs and Symptoms  Related risk factors and signs and symptoms are identified upon initiation of Human Response Clinical Practice Guideline (CPG).   Outcome: Progressing    Goal: Infection Prevention/Resolution  Patient will demonstrate the desired outcomes by discharge/transition of care.   Outcome: Progressing

## 2017-04-16 NOTE — Unmapped (Signed)
PICC LINE INSERTION PROCEDURE NOTE    Indications:  Antibiotic Therapy    Consent/Time Out:    Risks, benefits and alternatives discussed with patient.  Written consent was obtained prior to the procedure and is detailed in the medical record.  Prior to the start of the procedure, a time out was taken and the identity of the patient was confirmed via name, medical record number and  date of birth.  The availability of the correct equipment was verified.    Procedure Details:  The vein was identified and measured for appropriate catheter length. Maximum sterile techniques was utilized.  Sterile field prepared with necessary supplies and equipment. Insertion site was prepped with chlorhexidine solution and allowed to dry. Lidocaine 2 mL subcutaneously and intradermally administered to insertion site.  The catheter was primed with normal saline.  A 4 FR Single lumen was inserted to the L Brachial vein with 1 insertion attempt(s).  Catheter aspirated, 3 mL blood return present.  The catheter was then flushed with 10 mL of normal saline.   Insertion site cleansed, and sterile dressing applied per manufacturer guidelines. The Central Line Checklist was referenced.  The catheter was inserted without difficulty by Adrian Prows RN and assisted by Thelma Barge RN.      Findings:  Manufacturer:  [employer]  Lot #:  [id number]  CT Injectable (power):  Yes  Total catheter length:  47 cm.    Catheter left at:  0 cm.  Catheter trimmed:  yes  Port reserved:  No       Vein Size:   Left arm brachial vein compressible:  Yes, measurement:  4.1 mm    Tip Placement Verification:   [employer], Sherlock and Chest xray ordered to confirm placement      Workup Time:    60 minutes    Recommendations:  PICC Brochure given to patient with teaching instruction.      See vein image below:

## 2017-04-17 LAB — BASIC METABOLIC PANEL
ANION GAP: 5 mmol/L — ABNORMAL LOW (ref 9–15)
BLOOD UREA NITROGEN: 21 mg/dL (ref 7–21)
BUN / CREAT RATIO: 18
CALCIUM: 9 mg/dL (ref 8.5–10.2)
CHLORIDE: 106 mmol/L (ref 98–107)
CO2: 28 mmol/L (ref 22.0–30.0)
CREATININE: 1.18 mg/dL (ref 0.70–1.30)
EGFR MDRD NON AF AMER: >=60 mL/min/{1.73_m2} (ref >=60)
POTASSIUM: 4.3 mmol/L (ref 3.5–5.0)
SODIUM: 139 mmol/L (ref 135–145)

## 2017-04-17 LAB — CBC W/ AUTO DIFF
BASOPHILS ABSOLUTE COUNT: 0 10*9/L (ref 0.0–0.1)
EOSINOPHILS ABSOLUTE COUNT: 0.2 10*9/L (ref 0.0–0.4)
HEMATOCRIT: 43.7 % (ref 41.0–53.0)
HEMOGLOBIN: 14.5 g/dL (ref 13.5–17.5)
LARGE UNSTAINED CELLS: 2 % (ref 0–4)
LYMPHOCYTES ABSOLUTE COUNT: 1.5 10*9/L (ref 1.5–5.0)
MEAN CORPUSCULAR HEMOGLOBIN CONC: 33.2 g/dL (ref 31.0–37.0)
MEAN CORPUSCULAR HEMOGLOBIN: 31.4 pg (ref 26.0–34.0)
MEAN CORPUSCULAR VOLUME: 94.4 fL (ref 80.0–100.0)
MONOCYTES ABSOLUTE COUNT: 0.4 10*9/L (ref 0.2–0.8)
NEUTROPHILS ABSOLUTE COUNT: 4.6 10*9/L (ref 2.0–7.5)
RED BLOOD CELL COUNT: 4.63 10*12/L (ref 4.50–5.90)
RED CELL DISTRIBUTION WIDTH: 13.2 % (ref 12.0–15.0)
WBC ADJUSTED: 6.9 10*9/L (ref 4.5–11.0)

## 2017-04-17 LAB — BASOPHILS ABSOLUTE COUNT: Lab: 0

## 2017-04-17 LAB — BUN / CREAT RATIO: Urea nitrogen/Creatinine:MRto:Pt:Ser/Plas:Qn:: 18

## 2017-04-17 LAB — MAGNESIUM: Magnesium:MCnc:Pt:Ser/Plas:Qn:: 1.6

## 2017-04-17 NOTE — Unmapped (Signed)
Medicine Daily Progress Note    Assessment/Plan:  Principal Problem:    Bronchiectasis (CMS-HCC)  Active Problems:    Gastroesophageal reflux disease without esophagitis  Resolved Problems:    * No resolved hospital problems. *           Jordan [name] is a 62 y.o. male with bronchiectasis s/p lung resections, GERD who presents with a bronchiectasis exacerbation.    Idiopathic Bronchiectasis with Acute Exacerbation: Symptoms consistent with exacerbation, including shortness of breath, productive cough, chest tightness since October. He was prescribed Ciprofloxacin 750 mg q12h at that time with lack of significant improvement. FEV1 in clinic last week was decreased to 43.9% from prior 46.7% in October. Previous sputum cultures have grown mucoid and smooth PsA, intermittently resistant to Cipro. The lower respiratory culture from his clinic visit 1/15 is growing mucoid PsA resistant to Cipro but sensitive to Zosyn. CRP 5.7, no leukocytosis. QTc 445.   - Cont Zosyn 4.5 mg IV q6h (1/22-) and cipro po BID per Dr. Garner Nash recommendations  - HTS/albuterol nebs QID  - Airway clearance: vest and aerobika QID  - Symbicort BID  - Pulmozyme daily (Dr. Garner Nash recommends this in the hospital only)  - Azithromycin 250 mg daily  - Tobramycin nebs BID  ??  GERD: Home Prilosec    ??  FEN/Ppx  - no IVF  - replete lytes PRN  - general diet  - DVT: Lovenox  - GI: home PPI  ??  Code Status:  Full Code  ??  Dispo: Med G, Floor status  ___________________________________________________________________    Subjective:  Had one episode of nausea after breakfast and napping this morning which resolved with ginger ale and crackers, now feeling well but still wary of this episode. No other complaints, no abd pain/diarrhea.    Labs/Studies:  Labs and Studies from the last 24hrs per EMR and Reviewed    Objective:  Temp:  [36.5 ??C-36.8 ??C] 36.7 ??C  Heart Rate:  [73-87] 73  Resp:  [16-19] 16  BP: (117-138)/(69-81) 117/69  SpO2:  [93 %-96 %] 95 % General: Well appearing, alert, conversant, no distress.  Head: normocephalic, atraumatic  ENT:  MMM  Cardiovascular: Regular rate and rhythm, normal S1 and S2. No murmur appreciated.  Respiratory: Normal respiratory effort??on room air. Mild diffuse crackles, slightly decreased lung sounds consistent with prior surgeries   Extremities: ??No LE edema. Digital clubbing present.  Skin: Skin is warm, dry and intact. No rash.  Psychiatric:??Mood and affect??are normal. Speech and behavior are normal.

## 2017-04-17 NOTE — Unmapped (Signed)
Problem: Breathing Pattern Ineffective (Adult)  Goal: Identify Related Risk Factors and Signs and Symptoms  Related risk factors and signs and symptoms are identified upon initiation of Human Response Clinical Practice Guideline (CPG).   Outcome: Progressing  Patient did well with doing all of their treatments today. Patient did their airway clearance without complications.

## 2017-04-17 NOTE — Unmapped (Signed)
Problem: Patient Care Overview  Goal: Plan of Care Review  Outcome: Progressing   04/17/17 0625   OTHER   Plan of Care Reviewed With patient   Plan of Care Review   Progress improving   Patient resting quietly in bed. AOx4, VSS. Patient c/o chronic back pain, controlled with PRN pain medication and k-pad. Antibiotics administered as ordered w/o complications. PICC line returning blood, left KVO overnight. No s/s of respiratory distress or SOB noted on RA. Falls precautions maintained. Will continue to monitor.    Problem: Breathing Pattern Ineffective (Adult)  Goal: Identify Related Risk Factors and Signs and Symptoms  Related risk factors and signs and symptoms are identified upon initiation of Human Response Clinical Practice Guideline (CPG).   Outcome: Progressing   04/16/17 1550 04/16/17 1824   Breathing Pattern Ineffective (Adult)   Related Risk Factors (Breathing Pattern Ineffective) --  underlying condition   Signs and Symptoms (Breathing Pattern Ineffective) breathing pattern altered --        Problem: Infection, Risk/Actual (Adult)  Intervention: Manage Suspected/Actual Infection   04/16/17 2000   Interventions   Isolation Precautions contact precautions maintained       Goal: Infection Prevention/Resolution  Patient will demonstrate the desired outcomes by discharge/transition of care.   Outcome: Progressing   04/17/17 0625   Infection, Risk/Actual (Adult)   Infection Prevention/Resolution making progress toward outcome

## 2017-04-17 NOTE — Unmapped (Signed)
Tolerated svn treatment and airway clearance well

## 2017-04-17 NOTE — Unmapped (Signed)
Medicine Daily Progress Note    Assessment/Plan:  Principal Problem:    Bronchiectasis (CMS-HCC)  Active Problems:    Gastroesophageal reflux disease without esophagitis  Resolved Problems:    * No resolved hospital problems. *           Jordan [name] is a 62 y.o. male with bronchiectasis s/p lung resections, GERD who presents with a bronchiectasis exacerbation.    Idiopathic Bronchiectasis with Acute Exacerbation: Symptoms consistent with exacerbation, including shortness of breath, productive cough, chest tightness since October. He was prescribed Ciprofloxacin 750 mg q12h at that time with lack of significant improvement. FEV1 in clinic last week was decreased to 43.9% from prior 46.7% in October. Previous sputum cultures have grown mucoid and smooth PsA, intermittently resistant to Cipro. The lower respiratory culture from his clinic visit 1/15 is growing mucoid PsA resistant to Cipro but sensitive to Zosyn. CRP 5.7, no leukocytosis. QTc 445.   - Cont Zosyn 4.5 mg IV q6h (1/22-) and cipro po BID per Dr. Garner Nash recommendations  - HTS/albuterol nebs QID  - Airway clearance: vest and aerobika QID  - Symbicort BID  - Pulmozyme daily (Dr. Garner Nash recommends this in the hospital only)  - Azithromycin 250 mg daily  - Tobramycin nebs BID  ??  GERD: Home Prilosec    ??  FEN/Ppx  - no IVF  - replete lytes PRN  - general diet  - DVT: Lovenox  - GI: home PPI  ??  Code Status:  Full Code  ??  Dispo: Med G, Floor status  ___________________________________________________________________    Subjective:  No acute events overnight. Patient remains afebrile and sating well on room air. States his back pain is improved. Still coughing up brown mucous.     Labs/Studies:  Labs and Studies from the last 24hrs per EMR and Reviewed    Pressure Ulcer(s)    Active Pressure Ulcer     None                Objective:  Temp:  [36.5 ??C-36.8 ??C] 36.8 ??C  Heart Rate:  [72-90] 84  Resp:  [16-18] 16  BP: (121-138)/(71-81) 138/81  SpO2:  [93 %-95 %] 93 %    General: Well appearing, alert, conversant, no distress.  Head: normocephalic, atraumatic  ENT:  Oropharyngeal mucus membranes moist & pink.  Cardiovascular: Regular rate and rhythm, normal S1 and S2. No murmur appreciated.  Respiratory: Normal respiratory effort??on room air. Crackles and rhonchi throughout although improved from yesterday, slightly decreased lung sounds consistent with prior surgeries   Extremities: ??No LE edema. Digital clubbing present.  Skin: Skin is warm, dry and intact. No rash.  Psychiatric:??Mood and affect??are normal. Speech and behavior are normal.

## 2017-04-18 LAB — BASIC METABOLIC PANEL
ANION GAP: 7 mmol/L — ABNORMAL LOW (ref 9–15)
BUN / CREAT RATIO: 17
CALCIUM: 9.1 mg/dL (ref 8.5–10.2)
CHLORIDE: 106 mmol/L (ref 98–107)
CO2: 28 mmol/L (ref 22.0–30.0)
CREATININE: 1.17 mg/dL (ref 0.70–1.30)
EGFR MDRD AF AMER: >=60 mL/min/{1.73_m2} (ref >=60)
GLUCOSE RANDOM: 87 mg/dL (ref 65–179)
POTASSIUM: 4.2 mmol/L (ref 3.5–5.0)
SODIUM: 141 mmol/L (ref 135–145)

## 2017-04-18 LAB — CBC W/ AUTO DIFF
BASOPHILS ABSOLUTE COUNT: 0 10*9/L (ref 0.0–0.1)
EOSINOPHILS ABSOLUTE COUNT: 0.2 10*9/L (ref 0.0–0.4)
HEMATOCRIT: 44.2 % (ref 41.0–53.0)
HEMOGLOBIN: 14.7 g/dL (ref 13.5–17.5)
LARGE UNSTAINED CELLS: 3 % (ref 0–4)
MEAN CORPUSCULAR HEMOGLOBIN CONC: 33.3 g/dL (ref 31.0–37.0)
MEAN CORPUSCULAR HEMOGLOBIN: 31.2 pg (ref 26.0–34.0)
MEAN CORPUSCULAR VOLUME: 93.7 fL (ref 80.0–100.0)
MEAN PLATELET VOLUME: 8.3 fL (ref 7.0–10.0)
MONOCYTES ABSOLUTE COUNT: 0.4 10*9/L (ref 0.2–0.8)
NEUTROPHILS ABSOLUTE COUNT: 3.1 10*9/L (ref 2.0–7.5)
RED BLOOD CELL COUNT: 4.72 10*12/L (ref 4.50–5.90)
WBC ADJUSTED: 5.2 10*9/L (ref 4.5–11.0)

## 2017-04-18 LAB — CO2: Carbon dioxide:SCnc:Pt:Ser/Plas:Qn:: 28

## 2017-04-18 LAB — MAGNESIUM: Magnesium:MCnc:Pt:Ser/Plas:Qn:: 1.7

## 2017-04-18 LAB — HEMATOCRIT: Lab: 44.2

## 2017-04-18 NOTE — Unmapped (Signed)
Problem: Patient Care Overview  Goal: Plan of Care Review  Outcome: Progressing   04/18/17 0415   OTHER   Plan of Care Reviewed With patient   Plan of Care Review   Progress improving   Patient resting quietly in bed. AOx4, VSS. No s/s of respiratory distress or SOB noted on RA. Patient c/o pain in lower back, reports appropriate control with current pain regiment. Antibiotics administered as ordered w/o complications. Falls precautions maintained. Will continue to monitor.    Problem: Breathing Pattern Ineffective (Adult)  Goal: Identify Related Risk Factors and Signs and Symptoms  Related risk factors and signs and symptoms are identified upon initiation of Human Response Clinical Practice Guideline (CPG).   Outcome: Progressing   04/16/17 1550   Breathing Pattern Ineffective (Adult)   Signs and Symptoms (Breathing Pattern Ineffective) breathing pattern altered       Problem: Infection, Risk/Actual (Adult)  Intervention: Manage Suspected/Actual Infection   04/17/17 2000   Interventions   Isolation Precautions contact precautions maintained       Goal: Infection Prevention/Resolution  Patient will demonstrate the desired outcomes by discharge/transition of care.   Outcome: Progressing   04/18/17 0415   Infection, Risk/Actual (Adult)   Infection Prevention/Resolution making progress toward outcome

## 2017-04-18 NOTE — Unmapped (Signed)
Problem: Patient Care Overview  Goal: Plan of Care Review  Outcome: Progressing  Pt has been medicated for pain per his request twice this shift with moderate results but pain does not entirely go away. Continues with IV antibiotics. PICC line with occlusive dressing . Remains afebrile at this time. Seen by MDs on rounds. Plan of care reviewed with pt. Anticipation discharge next week with home infusion of antibiotics.     Problem: Breathing Pattern Ineffective (Adult)  Goal: Identify Related Risk Factors and Signs and Symptoms  Related risk factors and signs and symptoms are identified upon initiation of Human Response Clinical Practice Guideline (CPG).   Outcome: Progressing      Problem: Infection, Risk/Actual (Adult)  Goal: Identify Related Risk Factors and Signs and Symptoms  Related risk factors and signs and symptoms are identified upon initiation of Human Response Clinical Practice Guideline (CPG).   Outcome: Progressing

## 2017-04-18 NOTE — Unmapped (Signed)
Problem: Patient Care Overview  Goal: Plan of Care Review  Outcome: Progressing  Plan of care reviewed with pt this am. Seen by MDs on rounds. Anticipate discharge to home with home infusion tomorrow. Remains on contact precautions. Continues with IV antibiotics. Occlusive dressing to PICC site. Afebrile. Respiratory therapy in to do breathing treatments.  No acute distress noted. Medicated for pain prn with good results. Continue with plan of care.     Problem: Breathing Pattern Ineffective (Adult)  Goal: Identify Related Risk Factors and Signs and Symptoms  Related risk factors and signs and symptoms are identified upon initiation of Human Response Clinical Practice Guideline (CPG).   Outcome: Progressing      Problem: Infection, Risk/Actual (Adult)  Goal: Identify Related Risk Factors and Signs and Symptoms  Related risk factors and signs and symptoms are identified upon initiation of Human Response Clinical Practice Guideline (CPG).   Outcome: Progressing

## 2017-04-18 NOTE — Unmapped (Signed)
Medicine Daily Progress Note    Assessment/Plan:  Principal Problem:    Bronchiectasis (CMS-HCC)  Active Problems:    Gastroesophageal reflux disease without esophagitis  Resolved Problems:    * No resolved hospital problems. *           Subjective:  No acute events overnight. Patient states his breathing is improving and his sputum production is now nearly back to baseline. State his chronic pain is well controlled. Eating and drinking well. Nausea resolved. Remains afebrile, sating well on room air.   ________________________________________________________________________    Assessment/ Plan    Jordan [name] is a 62 y.o. male with bronchiectasis s/p lung resections, GERD who presents with a bronchiectasis exacerbation, symptoms currently improving.    Idiopathic Bronchiectasis with Acute Exacerbation: Symptoms consistent with exacerbation, including shortness of breath, productive cough, chest tightness since October. He was prescribed Ciprofloxacin 750 mg q12h at that time with lack of significant improvement. FEV1 in clinic last week was decreased to 43.9% from prior 46.7% in October. Previous sputum cultures have grown mucoid and smooth PsA, intermittently resistant to Cipro. The lower respiratory culture from his clinic visit 1/15 is growing mucoid PsA resistant to Cipro but sensitive to Zosyn. CRP 5.7, no leukocytosis. QTc 445.   - Cont Zosyn 4.5 mg IV q6h (1/22-) and cipro po BID per Dr. Garner Nash recommendations  - HTS/albuterol nebs QID  - Airway clearance: vest and aerobika QID  - Symbicort BID  - Pulmozyme daily (Dr. Garner Nash recommends this in the hospital only)  - Azithromycin 250 mg daily  - Tobramycin nebs BID  - If continues with symptomatic improvement will discuss with Dr. Garner Nash possibility of discharge home tomorrow vs Tuesday with home IV antibiotics.   ??  GERD: Home Prilosec    Chronic back pain  - Home Norco 7.5-325 not available on formulary  - Cont. Norco 5-325 PRN with increase to 2 tables as needed for severe pain    FEN/Ppx  - no IVF  - replete lytes PRN  - general diet  - DVT: Lovenox  - GI: home PPI  ??  Code Status:  Full Code  ??  Dispo: Med G, Floor status  ___________________________________________________________________    Labs/Studies:  Labs and Studies from the last 24hrs per EMR and Reviewed    Objective:  Temp:  [36.3 ??C-36.6 ??C] 36.5 ??C  Heart Rate:  [70-89] 89  Resp:  [16-18] 18  BP: (111-120)/(58-75) 120/75  SpO2:  [93 %-97 %] 94 %    General: Well appearing, alert, conversant, no distress.  Head: normocephalic, atraumatic  ENT:  MMM  Cardiovascular: Regular rate and rhythm, normal S1 and S2. No murmur appreciated.  Respiratory: Normal respiratory effort??on room air. Crackles significantly improved, slightly decreased lung sounds consistent with prior surgeries   Extremities: ??No LE edema. Digital clubbing present.  Skin: Skin is warm, dry and intact. No rash.  Psychiatric:??Mood and affect??are normal. Speech and behavior are normal.

## 2017-04-19 DIAGNOSIS — J470 Bronchiectasis with acute lower respiratory infection: Secondary | ICD-10-CM | POA: Diagnosis not present

## 2017-04-19 LAB — CBC W/ AUTO DIFF
BASOPHILS ABSOLUTE COUNT: 0 10*9/L (ref 0.0–0.1)
EOSINOPHILS ABSOLUTE COUNT: 0.2 10*9/L (ref 0.0–0.4)
HEMATOCRIT: 43.9 % (ref 41.0–53.0)
HEMOGLOBIN: 14.1 g/dL (ref 13.5–17.5)
LARGE UNSTAINED CELLS: 3 % (ref 0–4)
LYMPHOCYTES ABSOLUTE COUNT: 1.4 10*9/L — ABNORMAL LOW (ref 1.5–5.0)
MEAN CORPUSCULAR HEMOGLOBIN CONC: 32.2 g/dL (ref 31.0–37.0)
MEAN CORPUSCULAR HEMOGLOBIN: 30.3 pg (ref 26.0–34.0)
MEAN PLATELET VOLUME: 8.4 fL (ref 7.0–10.0)
NEUTROPHILS ABSOLUTE COUNT: 3.2 10*9/L (ref 2.0–7.5)
PLATELET COUNT: 140 10*9/L — ABNORMAL LOW (ref 150–440)
RED BLOOD CELL COUNT: 4.66 10*12/L (ref 4.50–5.90)
WBC ADJUSTED: 5.4 10*9/L (ref 4.5–11.0)

## 2017-04-19 LAB — BASIC METABOLIC PANEL
ANION GAP: 8 mmol/L — ABNORMAL LOW (ref 9–15)
BLOOD UREA NITROGEN: 22 mg/dL — ABNORMAL HIGH (ref 7–21)
CALCIUM: 8.9 mg/dL (ref 8.5–10.2)
CHLORIDE: 106 mmol/L (ref 98–107)
CO2: 26 mmol/L (ref 22.0–30.0)
CREATININE: 1.21 mg/dL (ref 0.70–1.30)
EGFR MDRD AF AMER: >=60 mL/min/{1.73_m2} (ref >=60)
EGFR MDRD NON AF AMER: >=60 mL/min/{1.73_m2} (ref >=60)
GLUCOSE RANDOM: 92 mg/dL (ref 65–179)
SODIUM: 140 mmol/L (ref 135–145)

## 2017-04-19 LAB — BASOPHILS ABSOLUTE COUNT: Lab: 0

## 2017-04-19 LAB — POTASSIUM: Potassium:SCnc:Pt:Ser/Plas:Qn:: 4.3

## 2017-04-19 LAB — MAGNESIUM: Magnesium:MCnc:Pt:Ser/Plas:Qn:: 1.7

## 2017-04-19 MED ORDER — PIPERACILLIN-TAZOBACTAM 4.5 GRAM/100 ML DEXTROSE(ISO-OSM) IV PIGGYBACK
Freq: Four times a day (QID) | INTRAVENOUS | 0 refills | 0.00000 days | Status: CP
Start: 2017-04-19 — End: 2017-04-19

## 2017-04-19 MED ORDER — PIPERACILLIN-TAZOBACTAM 4.5 GRAM/100 ML DEXTROSE(ISO-OSM) IV PIGGYBACK: 5 g | mL | Freq: Four times a day (QID) | 0 refills | 0 days | Status: AC

## 2017-04-19 MED ORDER — CIPROFLOXACIN 750 MG TABLET
ORAL_TABLET | Freq: Two times a day (BID) | ORAL | 0 refills | 0.00000 days | Status: CP
Start: 2017-04-19 — End: 2017-11-09

## 2017-04-19 MED ORDER — CIPROFLOXACIN 750 MG TABLET: 750 mg | tablet | Freq: Two times a day (BID) | 0 refills | 0 days | Status: AC

## 2017-04-19 MED FILL — CIPROFLOXACIN/750MG/TAB: CIPROFLOXACIN/750MG/TAB | 10 days supply | Qty: 19 | Fill #0

## 2017-04-19 NOTE — Unmapped (Signed)
Patient is preparing for discharge.  [name] states that he has all of the DME respiratory equipment that he needs, at this time.  No further questions or concerns at this time.

## 2017-04-19 NOTE — Unmapped (Signed)
Pharmacist Discharge Note  Patient Name: Jordan [name]  Reason for admission: Bronchiectasis Exacerbation  Reason for writing this note: patient requires medication-related outpatient intervention and/or monitoring    Highlighted medication changes with rationale (if applicable):    We are starting [name] on ciprofloxacin and piperacillin/tazobactam for his bronchiectasis flare. During his most recent admission he had a sputum culture which was +4 for mucoid psuedomonas. Cultures were sensitive to piperacillin/tazobactam and have had variable sensitivity to ciprofloxacin in the past.    - Ciprofloxacin 750 mg PO BID (Course start on 1/24, course end on 2/7)  - Piperacillin/tazobactam 4.5g IV q6H (Course start on 1/23, course end on 2/6)    Medication access/Adherence:  - No barriers identified    Outpatient follow-up:    Piperacillin/Tazobactam:   [ ]  Obtain CBC, SCr, electrolytes and LFTs within 5 day(s)    Thayer Headings. Pharm D Candidate    Future Appointments  Date Time Provider Department Center   07/06/2017 3:00 PM [facility] PFT 1 [id number]   07/06/2017 3:30 PM Truett Mainland, MD [id number]

## 2017-04-19 NOTE — Unmapped (Signed)
Physician Discharge Summary    Identifying Information:   Jordan [name]  11/22/1955  [id number]    Admit date: 04/14/2017    Discharge date: 04/19/2017     Discharge Service: Pulmonology (MDG)    Discharge Attending Physician: No att. providers found    Discharge to: Home    Discharge Diagnoses:  Principal Problem:    Bronchiectasis (CMS-HCC)  Active Problems:    Gastroesophageal reflux disease without esophagitis  Resolved Problems:    * No resolved hospital problems. *      Hospital Course:   Jordan [name] is a 62 y.o. male with bronchiectasis s/p lung resections, GERD??who presents with a bronchiectasis exacerbation.    Idiopathic Bronchiectasis with Acute Exacerbation: Patient reported cough with green/brown productive sputum, worsening shortness of breath, chest tightness on exertion, and fatigue since the end of October.  He was prescribed a course of ciprofloxacin 750 mg twice daily in November, however symptoms did not improve and thus he was directly admitted to the hospital for IV antibiotics. PFT's performed one week prior to admission with FEV1 43.9% down from 46.7% in October. His lower respiratory culture from 1/15 showed 4+ mucoid pseudomonas aeruginosa susceptible to Zosyn but resistant to Cipro. He was treated with Zosyn 4.5 mg IV q6h (PM 1/22-) and started on cipro po BID per Dr. Garner Nash recommendations. He was also treated with HTS/albuterol nebs??QID, airway clearance, Symbicort BID, Pulmozyme daily, Azithromycin 250 mg daily, andTobramycin nebs BID. Patient reported symptomatic improvement and decrease in sputum production back to baseline at time of discharge. Pulmozyme was discontinued at time of discharge. He was discharged with 10 more days of antibiotics to complete a total of a 14 day course (end date 2/6).     GERD:??Continued home Prilosec    Chronic back pain: Patient's home Norco 7.5-325 was not available on formulary. Used Norco 5-325 PRN with increase to 2 tables as needed for severe pain during admission.     Post Discharge Follow Up Issues:   Keep scheduled f/u appointment with Dr. Garner Nash    Procedures:  picc line, abx, airway clearance  _____________________________________________________________________________  Discharge Day Services:  BP 147/83  - Pulse 83  - Temp 36.6 ??C (Oral)  - Resp 16  - Wt 86.4 kg (190 lb 8 oz)  - SpO2 95%  - BMI 28.54 kg/m??   Pt seen on the day of discharge and determined appropriate for discharge.    Condition at Discharge: fair    Length of Discharge: I spent greater than 30 mins in the discharge of this patient.  _____________________________________________________________________________  Discharge Medications:     Your Medication List      START taking these medications    ciprofloxacin HCl 750 MG tablet  Commonly known as:  CIPRO  Take 1 tablet (750 mg total) by mouth every twelve (12) hours. for 10 days     piperacillin-tazobactam 4.5 gram/100 mL Pgbk  Commonly known as:  ZOSYN  Infuse 100 mL (4.5 g total) into a venous catheter Every six (6) hours. for 10 days First home dose today at 6 PM        CONTINUE taking these medications    albuterol 2.5 mg /3 mL (0.083 %) nebulizer solution  Inhale 2.5 mg by nebulization Four (4) times a day.     albuterol 90 mcg/actuation inhaler  Commonly known as:  PROAIR HFA  Inhale 2 puffs every four (4) hours as needed for wheezing or shortness of breath.  alcohol swabs Padm  Use as directed with inhaled antibiotics     azithromycin 250 MG tablet  Commonly known as:  ZITHROMAX  Take 1 tablet (250 mg total) by mouth daily.     budesonide-formoterol 160-4.5 mcg/actuation inhaler  Commonly known as:  SYMBICORT  Inhale 2 puffs Two (2) times a day.     CHEWABLE VITAMIN C 250 mg Chew  Generic drug:  ascorbic acid (vitamin C)  Chew 1,000 mg Two (2) times a day.     CIALIS 20 MG tablet  Generic drug:  tadalafil     colistimethate 150 mg injection  Commonly known as:  COLYMYCIN  Inhale 2 mL (150 mg total) Two (2) times a day. Inject 2mL SWFI into colistin vial, then withdraw 2mL (150mg ) for dose.     HYDROcodone-acetaminophen 7.5-325 mg per tablet  Commonly known as:  NORCO  Take 1 tablet by mouth three (3) times a day (at 6am, noon and 6pm).     HYDROcodone-homatropine 5-1.5 mg/5 mL syrup  Commonly known as:  HYCODAN  Take 5 mL by mouth every four (4) hours as needed for cough.     IRON (FERROUS SULFATE) 325 (65 FE) MG tablet  Generic drug:  ferrous sulfate  Take 325 mg by mouth Two (2) times a day.     mucus clearing device Devi  Use Acapella twice daily for airway clearance. Dx. 494.0.     omeprazole 20 MG capsule  Commonly known as:  PriLOSEC  Take 20 mg by mouth Two (2) times a day.     PARI LC D NEBULIZER Misc  Generic drug:  nebulizers  Provide 1 device  for use with inhaled medication.     sodium chloride 7% 7 % Nebu  Inhale 4 mL by nebulization 4 (four) times a day.     sterile water (PF) Soln  Use 2mL to mix colistin, add 2mL more to neb cup with 2mL of mixed colistin,nebulize twice a day     syringe with needle 3 mL 20 gauge x 1 Syrg  Commonly known as:  SYRINGE 3CC/20GX1  For use with inhaled antibiotic     testosterone cypionate 200 mg/mL injection  Commonly known as:  DEPOTESTOTERONE CYPIONATE  Frequency:QOW   Dosage:0.0     Instructions:  Note:Dose: 200 MG/ML     tobramycin (PF) 300 mg/5 mL nebulizer solution  Commonly known as:  [name]  INHALE CONTENTS OF 1 AMP (300MG ) VIA NEBULIZER EVERY 12 HOURS FOR 28 DAYS ON, THEN 28 DAYS OFF. REFILL: [phone]          _____________________________________________________________________________  Pending Test Results (if blank, then none):      Most Recent Labs:  Microbiology Results (last day)     ** No results found for the last 24 hours. **          Lab Results   Component Value Date    WBC 5.4 04/19/2017    HGB 14.1 04/19/2017    HCT 43.9 04/19/2017    PLT 140 (L) 04/19/2017       Lab Results   Component Value Date    NA 140 04/19/2017    K 4.3 04/19/2017    CL 106 04/19/2017 CO2 26.0 04/19/2017    BUN 22 (H) 04/19/2017    CREATININE 1.21 04/19/2017    CALCIUM 8.9 04/19/2017    MG 1.7 04/19/2017    PHOS 3.7 01/22/2016       Lab Results   Component Value Date  ALKPHOS 73 04/14/2017    BILITOT 0.6 04/14/2017    BILIDIR 0.40 04/02/2015    PROT 6.8 04/14/2017    ALBUMIN 4.1 04/14/2017    ALT 19 04/14/2017    AST 19 04/14/2017    GGT 64 06/21/2013       Lab Results   Component Value Date    PT 10.8 04/14/2017    INR 0.95 04/14/2017    APTT 32.6 07/25/2014     Hospital Radiology:  No results found.    _____________________________________________________________________________  Discharge Instructions:   Activity Instructions     Activity as tolerated             Diet Instructions     Discharge diet (specify)       Discharge Nutrition Therapy:  General          Other Instructions     Discharge instructions to patient: Call your Specialist doctor and make an appointment to see them (specify):       Specialist: Dr. Garner Nash  Within 4-6 weeks from the time you are discharged from the hospital         Discharge instructions to patient: Call your primary care doctor and make an appointment to see them:       Within 2 weeks from the time you are discharged from the hospital               Follow Up instructions and Outpatient Referrals     Discharge instructions       Hi, it was a pleasure taking care of you at [facility]. You were treated here for a bronchiectasis exacerbation and are showing improvement. You will continue your antibiotics (IV Zosyn and oral Ciprofloxacin) at home for the next 10 days.You should take your next dose of Zosyn at home around 6 PM and your next dose of Ciprofloxacin around 9 PM. Continue all your other home medications as usual. Call your doctor if you develop increased difficulty breathing, increase in cough or sputum production, or develop a fever greater than 100.4 degrees farenheit.   ??  To schedule a clinic appointment:   [facility]: [phone] ??  For urgent issues after hours:  Hospital Operator: [phone]) [phone], ask for Pulmonary Fellow on-call         Referral to Home Infusion       Performing location?:  [facility] Requested Disciplines:  Nursing    **Please contact your service pharmacist for assistance with discharge home health infusion monitoring. Please see link above for Laboratory Monitoring Guidelines.     Home Health/Home Infusion Orders:     Agency Details:   Infusion Agency: [facility] [phone]  [phone]      Infusion Start of Care Date: 04/19/17    Nursing Agency: [facility]  [phone]    Home Health Start of Care Date: 04/19/17    [facility] Skilled Nurse to teach patient/caregiver the administration of home infusion therapy as ordered.   Please teach/review the following, as applicable: how to flush and maintain IV line, medication bag change, storage/disposal of medication/supplies, and inventory control.  Monitor for signs and symptoms of infection, phlebitis, dislodgement/catheter migration, and occlusion of IV line. Instruct to keep IV site clean, dry and intact.   Review medication side effects.   Teach how to troubleshoot device/IV line function and who to call if dressing gets dirty, wet, or lifts off.   Review and provide  the patient/caregiver with contact information regarding who to contact within your agency for questions, concerns or issues.    CVAD Dressing Change:  Please change dressing weekly/prn using sterile CVAD kit/transparent dressing.   Please change Biopatch with CVAD dressing change.   Change the stabilization device (STATLOCK) as applicable with each dressing change.  Please change needleless cap at least weekly with dressing change and prn, and after every blood draw, using aseptic technique.    Note - Generic substitutions permissible by Home Infusion agencies.    IV Access:  Line Type: SAL Picc               Diameter/Gauge: 4 Fr.   Length Total: 47 cm.   Site: l brachial Arm Circumference: not charted  Placement Date:  04/15/17    Special Instructions:     Flush Orders:  PICC Line Adult Patient PICC Line Adult Patient:   Flush with 5 mLs of Normal Saline before and after each use and prn for line maintenance. Use after blood draws. Flush with Heparin 100units/mL 2 mLs at the end of each use and prn for line maintenance. Please change needleless cap weekly and after blood draws, or prn using aseptic technique. Please flush unused lumen(s) with Heparin 100units/mL 2 mLs every day.    LABS:    Pharmacist Discharge Note  Patient Name: Jordan [name]  Reason for admission: Bronchiectasis Exacerbation  Reason for writing this note: patient requires medication-related outpatient intervention and/or monitoring  ??  Highlighted medication changes with rationale (if applicable):  - Started:  ??  - Ciprofloxacin 750 mg PO BID (Course start on 1/24, course end on 2/7)  - Piperacillin/Tazobactam IV q6H (Course start on 1/23, course end on 2/6)  ??  Medication access/Adherence:  - No barriers identified  ??  Outpatient follow-up:  ??  Piperacillin/Tazobactam:   [ ]  Obtain CBC, SCr, electrolytes and LFTs within 5 day(s)  ??  Thayer Headings. Pharm D Candidate  ??  Future Appointments  Date Time Provider Department Center  07/06/2017 3:00 PM [facility] PFT 1 [facility]  07/06/2017 3:30 PM Truett Mainland, MD [facility]  ??         Referral to Home Infusion       Performing location?:  [facility] Requested Disciplines:  Nursing    **Please contact your service pharmacist for assistance with discharge home health infusion monitoring. Please see link above for Laboratory Monitoring Guidelines.     Home Health/Home Infusion Orders:     Agency Details:   Infusion Agency: [facility] [phone]  [phone]    Infusion Start of Care Date: 04/19/17    Nursing Agency: [facility] [phone]  Home Health Start of Care Date: 04/19/17    [facility] Skilled Nurse to teach patient/caregiver the administration of home infusion therapy as ordered.   Please teach/review the following, as applicable: how to flush and maintain IV line, medication bag change, storage/disposal of medication/supplies, and inventory control.  Monitor for signs and symptoms of infection, phlebitis, dislodgement/catheter migration, and occlusion of IV line. Instruct to keep IV site clean, dry and intact.   Review medication side effects.   Teach how to troubleshoot device/IV line function and who to call if dressing gets dirty, wet, or lifts off.   Review and provide the patient/caregiver with contact information regarding who to contact within your agency for questions, concerns or issues.  CVAD Dressing Change:  Please change dressing weekly/prn using sterile CVAD kit/transparent dressing.   Please change Biopatch with CVAD dressing change.   Change the stabilization device (STATLOCK) as applicable with each dressing change.  Please change needleless cap at least weekly with dressing change and prn, and after every blood draw, using aseptic technique.    Note - Generic substitutions permissible by Home Infusion agencies.    IV Access:  Line Type: SL Picc                Diameter/Gauge: 4 Fr.   Length Total: 47 cm.   Site: L Brachial  Arm Circumference: not charted  Placement Date:04/15/17  Special Instructions:     Flush Orders:  PICC Line Adult Patient PICC Line Adult Patient:   Flush with 5 mLs of Normal Saline before and after each use and prn for line maintenance. Use after blood draws. Flush with Heparin 100units/mL 2 mLs at the end of each use and prn for line maintenance. Please change needleless cap weekly and after blood draws, or prn using aseptic technique. Please flush unused lumen(s) with Heparin 100units/mL 2 mLs every day.    LABS:    Disciplines requested: Nursing and Home IV Antibiotics    Physician to follow patient's care (the person listed here will be responsible for signing ongoing orders): M. Rolm Baptise    Start of Care Date: 04/19/17    IV ANTIBIOTIC ORDERS:    **DO NOT DRAW DRUG LEVELS FROM PORT**    LAB MONITORING ORDERS:    Pharmacist Discharge Note  Patient Name: Jordan [name]  Reason for admission: Bronchiectasis Exacerbation  Reason for writing this note: patient requires medication-related outpatient intervention and/or monitoring  ??  Highlighted medication changes with rationale (if applicable):  - Started:  ??  - Ciprofloxacin 750 mg PO BID (Course start on 1/24, course end on 2/7)  - Piperacillin/Tazobactam IV q6H (Course start on 1/23, course end on 2/6)  ??  Medication access/Adherence:  - No barriers identified  ??  Outpatient follow-up:  ??  Piperacillin/Tazobactam:   [ ]  Obtain CBC, SCr, electrolytes and LFTs within 5 day(s)  ??  Thayer Headings. Pharm D Candidate  ??  Future Appointments  Date Time Provider Department Center  07/06/2017 3:00 PM [facility] PFT 1 [facility]  07/06/2017 3:30 PM Truett Mainland, MD [facility]  ??  84) [phone],CF Patient: [phone]) [phone]}    Please send labs to Alton Revere, Pharmacist  at the Pulmonary Specialty Clinic  Fax [phone]               Appointments which have been scheduled for you    Jul 06, 2017  3:00 PM EDT  (Arrive by 2:45 PM)  RETURN PFT 15 with [facility] PFT 1  [facility] PULMONARY SPECIALTY FUNCT [facility]) [address]  [address]  [place] [address]  [phone]   Jul 06, 2017  3:30 PM EDT  (Arrive by 3:15 PM)  RETURN BRONCHIECTSIS with Truett Mainland, MD  [facility] PULMONARY SPECIALTY CL [facility]ION) [address]  [address]  [place] [address]  [phone]

## 2017-04-19 NOTE — Unmapped (Signed)
Problem: Patient Care Overview  Goal: Plan of Care Review  Outcome: Progressing   04/19/17 0614   OTHER   Plan of Care Reviewed With patient   Plan of Care Review   Progress improving   Patient resting quietly in bed. AOx4, VSS. Afebrile this shift. No s/s of respiratory distress or SOB noted on RA. Patient c/o chronic back pain; patient states pain has been moderately controlled with current pain regiment. Antibiotics administered as ordered w/o complications. PICC line in place with blood return. Falls precautions maintained. Will continue to monitor.    Problem: Breathing Pattern Ineffective (Adult)  Goal: Identify Related Risk Factors and Signs and Symptoms  Related risk factors and signs and symptoms are identified upon initiation of Human Response Clinical Practice Guideline (CPG).   Outcome: Progressing   04/16/17 1550 04/16/17 1824   Breathing Pattern Ineffective (Adult)   Related Risk Factors (Breathing Pattern Ineffective) --  underlying condition   Signs and Symptoms (Breathing Pattern Ineffective) breathing pattern altered --        Problem: Infection, Risk/Actual (Adult)  Intervention: Manage Suspected/Actual Infection   04/18/17 2100   Interventions   Isolation Precautions contact precautions maintained       Goal: Infection Prevention/Resolution  Patient will demonstrate the desired outcomes by discharge/transition of care.   Outcome: Progressing   04/19/17 0614   Infection, Risk/Actual (Adult)   Infection Prevention/Resolution making progress toward outcome

## 2017-04-20 DIAGNOSIS — J470 Bronchiectasis with acute lower respiratory infection: Secondary | ICD-10-CM | POA: Diagnosis not present

## 2017-04-20 DIAGNOSIS — B965 Pseudomonas (aeruginosa) (mallei) (pseudomallei) as the cause of diseases classified elsewhere: Secondary | ICD-10-CM | POA: Diagnosis not present

## 2017-04-20 DIAGNOSIS — Z452 Encounter for adjustment and management of vascular access device: Secondary | ICD-10-CM | POA: Diagnosis not present

## 2017-04-20 DIAGNOSIS — J471 Bronchiectasis with (acute) exacerbation: Secondary | ICD-10-CM | POA: Diagnosis not present

## 2017-04-21 DIAGNOSIS — J470 Bronchiectasis with acute lower respiratory infection: Secondary | ICD-10-CM | POA: Diagnosis not present

## 2017-04-22 DIAGNOSIS — Z452 Encounter for adjustment and management of vascular access device: Secondary | ICD-10-CM | POA: Diagnosis not present

## 2017-04-22 DIAGNOSIS — J471 Bronchiectasis with (acute) exacerbation: Secondary | ICD-10-CM | POA: Diagnosis not present

## 2017-04-22 DIAGNOSIS — B965 Pseudomonas (aeruginosa) (mallei) (pseudomallei) as the cause of diseases classified elsewhere: Secondary | ICD-10-CM | POA: Diagnosis not present

## 2017-04-26 DIAGNOSIS — B965 Pseudomonas (aeruginosa) (mallei) (pseudomallei) as the cause of diseases classified elsewhere: Secondary | ICD-10-CM | POA: Diagnosis not present

## 2017-04-26 DIAGNOSIS — J471 Bronchiectasis with (acute) exacerbation: Secondary | ICD-10-CM | POA: Diagnosis not present

## 2017-04-26 DIAGNOSIS — Z452 Encounter for adjustment and management of vascular access device: Secondary | ICD-10-CM | POA: Diagnosis not present

## 2017-04-30 DIAGNOSIS — Z452 Encounter for adjustment and management of vascular access device: Secondary | ICD-10-CM | POA: Diagnosis not present

## 2017-04-30 DIAGNOSIS — B965 Pseudomonas (aeruginosa) (mallei) (pseudomallei) as the cause of diseases classified elsewhere: Secondary | ICD-10-CM | POA: Diagnosis not present

## 2017-04-30 DIAGNOSIS — J471 Bronchiectasis with (acute) exacerbation: Secondary | ICD-10-CM | POA: Diagnosis not present

## 2017-05-14 DIAGNOSIS — E291 Testicular hypofunction: Secondary | ICD-10-CM | POA: Diagnosis not present

## 2017-05-21 NOTE — Unmapped (Signed)
Error.  Mistakenly opened encounter.

## 2017-05-28 DIAGNOSIS — E291 Testicular hypofunction: Secondary | ICD-10-CM | POA: Diagnosis not present

## 2017-05-28 DIAGNOSIS — Z79899 Other long term (current) drug therapy: Secondary | ICD-10-CM | POA: Diagnosis not present

## 2017-06-08 NOTE — Unmapped (Signed)
[facility] Refill Coordination Note: Pulmonary         Jordan [name], DOB: [birth year]  Address: [address]  [place] [address]  All above HIPAA information was verified with patient.     Medication Adherence    Patient Reported X Missed Doses in the Last Month:  0  Specialty Medication:  COLISTIN *KIT* OFF MONTH -ON MONTH 06/21/17   Patient is on additional specialty medications:  No  Informant:  patient  Support Network for Adherence:  family member  Confirmed Plan for Next Specialty Medication Refill:  delivery by pharmacy  Refills Needed for Supportive Medications:  not needed  Medication Assistance Program  Refill Coordination  Has the Patients' Contact Information Changed:  No    Is the Shipping Address Different:  No    Shipping Information  Delivery Scheduled:  Yes  Delivery Date:  06/15/17  Medications to be Shipped:      MEDICATIONS    -Inhaled colistin 150mg  and kit: sodium chloride 0.9%, syringes with needles, Pari nebulizer cup, alcohol swabs  STERILE WATER                    -Confirmed the medication and dosage are correct and have not changed: Yes, regimen is correct and unchanged.  Is this medicine covered by Medicare Part B? No.           ADDITIONAL NOTES          TO BEGIN APRIL 1          Questions for the pharmacist: No  Shipping address confirmed in FSI.       The patient will receive an [employer]SI print out for each medication shipped and additional FDA Medication Guides as required.  Patient education from [place] or Robet Leu may also be included in the shipment.        Ledora Bottcher

## 2017-06-11 DIAGNOSIS — M25562 Pain in left knee: Secondary | ICD-10-CM | POA: Diagnosis not present

## 2017-06-11 DIAGNOSIS — Z23 Encounter for immunization: Secondary | ICD-10-CM | POA: Diagnosis not present

## 2017-06-11 DIAGNOSIS — E291 Testicular hypofunction: Secondary | ICD-10-CM | POA: Diagnosis not present

## 2017-06-11 DIAGNOSIS — G8929 Other chronic pain: Secondary | ICD-10-CM | POA: Diagnosis not present

## 2017-06-14 MED FILL — PARI LC PLUS NEBULIZER SET//MISC: PARI LC PLUS NEBULIZER SET//MISC | 30 days supply | Qty: 1 | Fill #2

## 2017-06-14 MED FILL — STERILE WATER INJ//10ML: STERILE WATER INJ//10ML | 30 days supply | Qty: 60 | Fill #2

## 2017-06-14 MED FILL — ALCOHOL PADS//: ALCOHOL PADS// | 50 days supply | Qty: 1 | Fill #4

## 2017-06-14 MED FILL — COLISTIMETHATE/150MG/INJ: COLISTIMETHATE/150MG/INJ | 30 days supply | Qty: 60 | Fill #3

## 2017-06-14 MED FILL — NORMAL SALINE FLUSH/0.9%/SOLN: NORMAL SALINE FLUSH/0.9%/SOLN | 30 days supply | Qty: 60 | Fill #5

## 2017-06-14 MED FILL — BD 3ML LUER-LOK SYRINGE 2/20GX1.5/MISC: BD 3ML LUER-LOK SYRINGE 2/20GX1.5/MISC | 30 days supply | Qty: 60 | Fill #2

## 2017-06-24 DIAGNOSIS — R194 Change in bowel habit: Secondary | ICD-10-CM | POA: Diagnosis not present

## 2017-06-25 DIAGNOSIS — E291 Testicular hypofunction: Secondary | ICD-10-CM | POA: Diagnosis not present

## 2017-07-01 DIAGNOSIS — R972 Elevated prostate specific antigen [PSA]: Secondary | ICD-10-CM | POA: Diagnosis not present

## 2017-07-01 DIAGNOSIS — E349 Endocrine disorder, unspecified: Secondary | ICD-10-CM | POA: Diagnosis not present

## 2017-07-06 ENCOUNTER — Encounter: Admit: 2017-07-06 | Discharge: 2017-07-06 | Payer: MEDICARE

## 2017-07-06 ENCOUNTER — Encounter: Admit: 2017-07-06 | Discharge: 2017-07-06 | Payer: MEDICARE | Attending: Internal Medicine | Primary: Internal Medicine

## 2017-07-06 ENCOUNTER — Encounter: Admit: 2017-07-06 | Discharge: 2017-07-06 | Payer: MEDICARE | Attending: Pharmacist | Primary: Pharmacist

## 2017-07-06 DIAGNOSIS — B965 Pseudomonas (aeruginosa) (mallei) (pseudomallei) as the cause of diseases classified elsewhere: Secondary | ICD-10-CM | POA: Diagnosis not present

## 2017-07-06 DIAGNOSIS — A498 Other bacterial infections of unspecified site: Secondary | ICD-10-CM | POA: Diagnosis not present

## 2017-07-06 DIAGNOSIS — J479 Bronchiectasis, uncomplicated: Secondary | ICD-10-CM | POA: Diagnosis not present

## 2017-07-06 DIAGNOSIS — I10 Essential (primary) hypertension: Secondary | ICD-10-CM | POA: Diagnosis not present

## 2017-07-06 MED ORDER — DORNASE ALFA 1 MG/ML SOLUTION FOR INHALATION
Freq: Every day | RESPIRATORY_TRACT | 11 refills | 0.00000 days | Status: CP
Start: 2017-07-06 — End: 2018-07-14

## 2017-07-06 NOTE — Unmapped (Signed)
Sputum is collected and sent for analysis

## 2017-07-06 NOTE — Unmapped (Addendum)
[facility] [place]  [facility] for Bronchiectasis Care    Assessment:      Patient:Jordan [name] ([birth year])  Reason for visit: Jordan [name] is a 62 y.o.male who returns for post-hospitalization follow-up of bronchiectasis secondary to unknown cause (idiopathic) with Pseudomonas colonization. He feels better as compared to his last visit but wonders if he could feel better had he received a third week of IV antibiotics.     Plan:      Problem List Items Addressed This Visit        Respiratory    Bronchiectasis (CMS-HCC)     Bronchiectasis Severity Index Score:    Age 33-69 = 2   BMI 18.5 or higher = 0   FEV1 % predicted 30-49% = 2   Hospitalized for severe exacerbation in past 2 years Yes = 5   Exacerbations in previous year 0-2 = 0   mMRC dyspnea score 1 = I get short of breath when hurrying on level ground or walking up a slight hill.   Chronic Pseudomonas colonization (at least 2 positive cultures at least 3 months apart within 1 year) Yes = 3   Colonization with other potential pathogenic bacteria (at least 2 positive cultures at least 3 months apart within 1 year) No = 0   Radiologic Severity 3 or more lobes or cystic brx = 1       Total Score 14     BSI score of 9+ = Severe bronchiectasis (1 year outcomes 7.6 - 10.5% mortality, 16.7 - 52.6% hospitalization rate; 4 year outcomes 9.9 - 29.2% mortality, 41.2 - 80.4% hospitalization)    Jordan Quant JD et al. The Bronchiectasis Severity Index: An International Derivation and Validation Study. AJRCCM, 2013; 189(5): 161-096.)         Relevant Medications    dornase alfa (PULMOZYME) 1 mg/mL nebulizer solution       Other    Pseudomonas aeruginosa infection - Primary        Reinforced importance of regular airway clearance.  In bronchiectasis, there is impaired mucus clearance from both the airway changes and from the underlying cause of the bronchiectasis.  This impaired clearance leads to accumulation of mucus, resulting in inflammation, accumulation of bacteria, and worsening damage to the airways.  Therefore, augmentation of airway clearance is critical. Jordan [name] was provided websites with information about bronchiectasis, social forums for patients with bronchiectasis and NTM, and airway clearance techniques    Patient did not need to meet with the respiratory therapist today. Patient was provided written instructions on proper order of treatments.  Patient should bring any new airway clearance devices to the next visit to review technique with the respiratory therapist.  The following changes were made to the inhaled medications: adding Pulmozyme to regimen. While the clinical trial done over 20 years ago did not show benefit in non-CF bronchiectasis, patients were not selected based on neutrophil elastase (NE) in sputum. Some bronchiectasis patients have levels of NE that are quite similar to CF patients and therefore, are likely to benefit from Pulmozyme use. Jordan [name] has experienced improvement during his hospitalization and since he continues to have thick sputum, I would like to add this to his regimen.    Patient spontaneously expectorated sputum that was sent for bacterial and AFB cultures. These results will help to guide antibiotic therapy in the future during exacerbations.  If he has future exacerbations, sputum should be sent for bacterial and AFB cultures. The bacterial culture should  be processed like a cystic fibrosis sample given the overlapping pathogens. There are standing orders for these cultures in our system.    I provided him with information about the Vogmask and [employer]. I also encouraged him to try using Align or another probiotic to see if this helps with his stools.    He asked about whether PORT placement is necessary or prudent at this point. Since he only had 1 exacerbation requiring IV antibiotics in the past year, I don't think it is necessary to pursue PORT placement. In reviewing the PICC team note, they had no difficulty finding a vein or threading the catheter. Should this occur, I would have VIR place a tunneled line for IV antibiotics. When Jordan [name] would return to have the tunneled line removed, I would have VIR convert to a PORT.    Jordan [name] has received the quadrivalent influenza vaccine this fall. He is up to date on pneumococcal vaccination, having received the PPSV 23 (Pneumovax) on 02/07/16 and PCV13 (Prevnar) on 04/28/13.  A repeat PPSV23 should be given on 02/06/2021 (1 year after PCV13 and 5 years post PPSV23). He is up to date for the Tdap vaccine.    Jordan [name] will return to clinic in 3 months for follow-up with pre-bronchodilator spirometry and sputum cultures.  He will call or send me a message via MyChart if questions or concerns arise before this visit.      Subjective:      HPI: Jordan [name] is a 62 y.o. male who returns for follow-up of bronchiectasis secondary to unknown cause (idiopathic) with Pseudomonas colonization.      Since the last clinic visit on 04/06/17:  ?? he has been treated for an exacerbation 1 time.  his last exacerbation was 04/14/17 when he was admitted to [facility] and treated for two weeks with oral ciprofloxacin and IV Zosyn.  He felt better and ready to complete a 2 week course (rather than typical 3 week course). However, he then started to feel a little worse and in the future, he would prefer to be treated for 3 weeks.  ?? He has been treated for a bronchiectasis exacerbation 2 times in the past year.  ?? he has been admitted for an exacerbation.  His last hospitalization was 04/14/17; prior to that it was 01/22/16.  ?? he has not experienced hemoptysis.     ?? Cough is productive of lighter color sputum. This is better. Typically thicker in the mornings and is looser after nebulizing hypertonic saline.  ?? Wheezing present, notices when walk outside. Chest tightness is less present. Using rescue albuterol 2-4 times per day if outside with pollen. If not outside, will need once in addition to prior to HTS.  ?? Pleurisy is absent.  ?? Switched to Colistin this month. Alternates with [name].  Used Pulmozyme in hospital and felt that it really helped make his sputum easier to expectorate.    For airway clearance, Jordan [name] is using Aerobika 4 times per day and Hypertonic Saline 7% 4 times per day.    Exercise capacity is rated as good. MMRC of 1 = I get short of breath when hurrying on level ground or walking up a slight hill.  Feels like dyspnea is relatively stable following his last lung resection.  Feels like lungs take some time to catch up with his breathing.  Jordan [name] is not on home oxygen therapy.  His oxygen saturations were 90% walking in to clinic today.     Review of Systems  Because PSA level is rising, scheduled for prostate biopsy in mid-May. At the end of May, he is getting a colonoscopy due to change in stool frequency (4-6 per day) and caliber (thinner). Stools have changed over the past year, which is also when we started chronic azithromycin. Left knee pain and catching without inciting cause although did have meniscus surgery in 2012 on this knee. Remainder of a complete review of systems was negative unless mentioned above.    Past Medical History:   Diagnosis Date   ??? Abscess of lung (CMS-HCC) 11/06/2010    Overview:  See CT Chest 10/22/10 12/05/2010 right thoracotomy resection of right middle lobe and resection of right lower lobe abscess  Last Assessment & Plan:  See CT Chest 10/22/10 12/05/2010 right thoracotomy resection of right middle lobe and resection of right lower lobe abscess  He is at risk of recurrent bronchiectasis/ abscess so ok to rx with prn augmentin cycles     ??? Arthritis     back   ??? Biceps tendon tear 2013    left side   ??? Bronchiectasis (CMS-HCC)     chronic psuedomonas infection   ??? Degenerative joint disease of left acromioclavicular joint    ??? GERD (gastroesophageal reflux disease)    ??? Hypertension    ??? Obstructive sleep apnea on CPAP 03/14/13    AHI 33.5, on BiPAP 12/8 based on sleep study 09/2013   ??? Pneumonia 2012    Lung abcess    ??? PONV (postoperative nausea and vomiting)    ??? Rotator cuff injury left   ??? Vertigo      Current Outpatient Medications   Medication Sig Dispense Refill   ??? albuterol (PROAIR HFA) 90 mcg/actuation inhaler Inhale 2 puffs every four (4) hours as needed for wheezing or shortness of breath. 3 Inhaler 3   ??? albuterol 2.5 mg /3 mL (0.083 %) nebulizer solution Inhale 2.5 mg by nebulization Four (4) times a day.     ??? alcohol swabs PadM Use as directed with inhaled antibiotics 100 each 5   ??? ascorbic acid (CHEWABLE VITAMIN C) 250 mg Chew Chew 1,000 mg Two (2) times a day.      ??? azithromycin (ZITHROMAX) 250 MG tablet Take 1 tablet (250 mg total) by mouth daily. 30 tablet 11   ??? BENEFIBER, GUAR GUM, ORAL Take 1 Act by mouth Two (2) times a day. Take 1 tablespoon BID     ??? budesonide-formoterol (SYMBICORT) 160-4.5 mcg/actuation inhaler Inhale 2 puffs Two (2) times a day. 3 Inhaler 1   ??? CIALIS 20 mg tablet      ??? colistimethate (COLYMYCIN) 150 mg injection Inhale 2 mL (150 mg total) Two (2) times a day. Inject 2mL SWFI into colistin vial, then withdraw 2mL (150mg ) for dose. 60 vial 5   ??? ferrous sulfate (IRON, FERROUS SULFATE,) 325 (65 FE) MG tablet Take 325 mg by mouth Two (2) times a day.     ??? HYDROcodone-acetaminophen (NORCO) 7.5-325 mg per tablet Take 1 tablet by mouth three (3) times a day (at 6am, noon and 6pm).     ??? HYDROcodone-homatropine (HYCODAN) 5-1.5 mg/5 mL syrup Take 5 mL by mouth every four (4) hours as needed for cough. 120 mL 0   ??? mucus clearing device Devi Use Acapella twice daily for airway clearance. Dx. 494.0. 1 Device 0   ??? omeprazole (PRILOSEC) 20 MG capsule Take 20 mg by mouth Two (2) times a day.      ???  PARI LC D NEBULIZER Misc Provide 1 device  for use with inhaled medication. 1 each 11   ??? sodium chloride 7% 7 % Nebu Inhale 4 mL by nebulization 4 (four) times a day. 1440 mL 3   ??? sterile water, PF, Soln Use 2mL to mix colistin, add 2mL more to neb cup with 2mL of mixed colistin,nebulize twice a day 60 ampule 5   ??? syringe with needle (SYRINGE 3CC/20GX1) 3 mL 20 gauge x 1 Syrg For use with inhaled antibiotic 100 Syringe 5   ??? testosterone cypionate (DEPOTESTOTERONE CYPIONATE) 200 mg/mL injection Frequency:QOW   Dosage:0.0     Instructions:  Note:Dose: 200 MG/ML     ??? tobramycin, PF, ([name]) 300 mg/5 mL nebulizer solution INHALE CONTENTS OF 1 AMP (300MG ) VIA NEBULIZER EVERY 12 HOURS FOR 28 DAYS ON, THEN 28 DAYS OFF. REFILL: [phone] 280 mL 6     No current facility-administered medications for this visit.        Allergies  Reviewed at 3:19 PM      Reactions Comment    Gentamicin Other (See Comments) BALANCE ISSUES    Tobramycin Other (See Comments) ototoxicity    Morphine Nausea And Vomiting         Social History     Socioeconomic History   ??? Marital status: Married     Spouse name: Gavin Pound   ??? Number of children: 2   ??? Years of education: None   ??? Highest education level: None   Occupational History     Employer: NOT EMPLOYED   Social Needs   ??? Financial resource strain: None   ??? Food insecurity:     Worry: None     Inability: None   ??? Transportation needs:     Medical: None     Non-medical: None   Tobacco Use   ??? Smoking status: Never Smoker   ??? Smokeless tobacco: Never Used   Substance and Sexual Activity   ??? Alcohol use: No     Alcohol/week: 0.0 oz   ??? Drug use: No   ??? Sexual activity: None   Lifestyle   ??? Physical activity:     Days per week: None     Minutes per session: None   ??? Stress: None   Relationships   ??? Social connections:     Talks on phone: None     Gets together: None     Attends religious service: None     Active member of club or organization: None     Attends meetings of clubs or organizations: None     Relationship status: None   ??? Intimate partner violence:     Fear of current or ex partner: None     Emotionally abused: None     Physically abused: None     Forced sexual activity: None   Other Topics Concern   ??? 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         Objective:   Objective   BP 135/76 (BP Site: L Arm, BP Position: Sitting, BP Cuff Size: Medium)  - Pulse 91  - Temp 36.8 ??C (98.2 ??F) (Oral)  - Resp 20  - Ht 174 cm (5' 8.5)  - Wt 94.8 kg (209 lb)  - SpO2 94%  - BMI 31.31 kg/m??   General Appearance:   Caucasian male appearing comfortable, non-toxic, in  no distress, and stated age.   Eyes:  PERRL, conjunctiva clear, EOM's intact. No drainage.   Nose: Nares normal, septum midline. Normal mucosa.  No drainage or polyps.  No sinus tenderness.   Oropharynx: Moist mucus membranes without lesions or thrush.  Good dentition.  Posterior pharynx clear.   Respiratory:   Decreased breath sounds over right mid to lower lobe consistent with prior resection. Crackles and bronchial breath sounds over left lower lobe. Easy work of breathing without accessory muscle use.     Cardiovascular:  Regular rate and rhythm. S1 and S2 normal. No murmur, rub  or gallop.  Pulses intact and symmetric.  No peripheral cyanosis or edema.   Musculoskeletal: No tenderness or deformity of the chest wall.  Joints normal.   Mild clubbing.   Skin: No rashes, lesions, skin changes.   Heme/Lymph: No cervical, supraclavicular, submandibular, or suprasternal adenopathy.  No bruising or petechiae.   Neurologic: Alert and oriented x3. No focal neurological deficits.  Normal gait.      Diagnostic Review:   Pulmonary Functions Testing Results:      Measures are consistent with severe airway obstruction. FVC reduced, likely reflects moderate restriction post thoracic surgery. Normal inspiratory limb. Measures are decreased as compared to 12/29/16 and stable as compared to 04/06/17.        Chest CT (03/31/16): Images personally reviewed. Status post right middle and right lower lobectomies. Tiny loculated basilar right hydropneumothorax, probably chronic. Moderate cylindrical and varicoid bronchiectasis throughout both lungs, with associated scattered mucoid impaction, tree-in-bud opacities and scattered peribronchovascular and subpleural consolidation in the right upper lobe. These findings are largely new compared to the remote prior CT studies and most consistent with  significant progression of chronic infectious bronchiolitis due to atypical mycobacterial infection (MAI). Two-vessel coronary atherosclerosis. Nonobstructing right nephrolithiasis.    Cultures  Lab Results   Component Value Date    LABLORES 4+ Mucoid Pseudomonas aeruginosa (A) 04/06/2017    LABLORES 3+ Oropharyngeal Flora Isolated 04/06/2017    ORG Smooth Pseudomonas aeruginosa (A) 05/25/2014    ORG Mucoid Pseudomonas aeruginosa (A) 05/25/2014    ORG Mould (A) 05/25/2014    AFBCX No Acid Fast Bacilli Detected 04/06/2017    AFBCX Actinomadura species (A) 12/29/2016     Bronchiectasis Evaluation:  IgG with subclasses:   Lab Results   Component Value Date/Time    IGGT 996 04/28/2012 06:21 AM    IGG1 402 04/28/2012 06:21 AM    IGG2 399 04/28/2012 06:21 AM    IGG3 148.0 (H) 04/28/2012 06:21 AM    IGG4 47.7 04/28/2012 06:21 AM     IgA (09/21/11): 283   IgM (09/21/11): 52   IgE:   Lab Results   Component Value Date/Time    IGE 10.5 01/22/2016 09:53 PM     HIV: No results found for: HIVAGAB    CBC-D:   Lab Results   Component Value Date/Time    HGB 14.1 04/19/2017 05:45 AM    HGB 14.8 03/24/2015 05:31 PM    HCT 43.9 04/19/2017 05:45 AM    HCT 30.8 (L) 08/13/2013 03:57 AM    PLT 140 (L) 04/19/2017 05:45 AM    PLT 292 08/13/2013 03:57 AM    NEUTROABS 3.2 04/19/2017 05:45 AM    NEUTROABS 4.9 06/21/2013 09:31 PM    EOSABS 0.2 04/19/2017 05:45 AM    EOSABS 0.2 06/21/2013 09:31 PM     Alpha-1-Antitrypsin (11/11/2010): MM genotype.    CF testing: Sweat test   Lab  Results   Component Value Date/Time    AMTLFT 195 06/17/2012 11:39 AM    AMTRT 254 06/17/2012 11:39 AM    SWCLL 6 06/17/2012 11:39 AM    SWCLR 7 06/17/2012 11:39 AM Health Care Maintenance:  Most Recent Immunizations   Administered Date(s) Administered   ??? INFLUENZA TIV (TRI) 51MO+ W/ PRESERV (IM) 01/07/2012   ??? INFLUENZA TIV (TRI) PF (IM) 12/09/2012   ??? Influenza Vaccine Quad (IIV4 PF) 83mo+ injectable 01/31/2016   ??? Influenza Virus Vaccine, unspecified formulation 12/01/2016   ??? Influenza Whole 12/21/2008   ??? PNEUMOCOCCAL POLYSACCHARIDE 23 02/07/2016   ??? Pneumococcal Conjugate 13-Valent 04/28/2013   ??? TdaP 03/22/2012

## 2017-07-06 NOTE — Unmapped (Signed)
Vogmask or [employer] and I will work on getting you the Pulmozyme to use daily.    Order of Treatments  1. Bronchodilator (Albuterol): Relaxes the large smooth muscle and opens up the lungs. If using neb, can be done with airway clearance.  2. Hypertonic Saline 7%: Hydrates the lungs and helps to thin the mucus to make it easier to cough up. Can be done with airway clearance.  3. Airway Clearance Waymon Budge): Helps to mobilize the mucus to make it easier to cough up.  4. Pulmozyme: Mucolytic, breaks down cellular DNA and thins the mucus to make it easier to cough up. To be done after airway clearance.  5. Inhaled Antibiotics ([name] or Colistin): Helps to clear infections; to be done after lungs are open and cleaned out.  6. Inhaled Corticosteroids (Symbicort): Anti-inflammatory; maintenance medication; take daily even if you feel fine. To be done after lungs are open and cleaned out.    Sputum sent for culture.    Probiotic like Align.    Symptoms of a bronchiectasis exacerbation: 48 hours of at least three of the following:  ?? Increased cough  ?? Change in volume or appearance of sputum  ?? Increased sputum purulence  ?? Worsening shortness of breath and/or exercise tolerance  ?? Fatigue and/or malaise  ?? Coughing up blood (hemoptysis)    If you are experiencing some of these symptoms:  ?? Increase the frequency and/or intensity of your airway clearance  ?? Try to submit a sputum sample for bacterial and AFB cultures (at [facility] or locally)  ?? Reach out to me or your local physician.  ?? If you need antibiotics, recommend treating for 14 days.      Please bring any new airway clearance equipment to your next visit to ensure that you are using and caring for it properly.    Thank you for allowing me to be a part of your care. Please call the clinic with any questions.    Viona Gilmore, MD, MPH  Pulmonary and Critical Care Medicine  [address]  [id number]  [place], [place] [address]    Thank you for your visit to the [facility] Pulmonary [facility]. You may receive a survey from [facility] regarding your visit today, and we are eager to use this feedback to improve your experience. Thank you for taking the time to fill it out.    Between appointments, you can reach [place] at these numbers:    For appointments or the Pulmonary Nurse: [phone]  Fax: [phone]    For urgent issues after hours:  Hospital Operator: [phone], ask for Pulmonary Fellow on call    For further information, check out the websites below:    [facility] for Bronchiectasis: [email or web address]    World Bronchiectasis Conference in [place] Patient session October 03, 2016: Videos can be watched here.     Bronchiectasis Toolbox (information about airway clearance): DiningCalendar.de    Copy for Individuals with Bronchiectasis and/or NTM through the Bronchiectasis Registry and the COPD Foundation: https://www.mckee-young.org/    Information about Non-tuberculous Mycobacterial Infections:  https://www.ntminfo.org     Interested in clinical trials and other research opportunities?  www.clinicaltrials.gov  The Rare Diseases Clinical Research Network [facility]) Genetic Disorders of  Mucociliary Clearance Consortium [facility]) Contact Registry is a way for patients with disorders of mucociliary clearance (such as bronchiectasis) and their family members to learn about research studies they may be able to join. Participation is  completely voluntary and you may choose to withdraw at any time. There is no cost to join the [employer]. For more information or to join the registry please go to the following website:  [email or web address]

## 2017-07-07 NOTE — Unmapped (Signed)
Bronchiectasis Severity Index Score:    Age 63-69 = 2   BMI 18.5 or higher = 0   FEV1 % predicted 30-49% = 2   Hospitalized for severe exacerbation in past 2 years Yes = 5   Exacerbations in previous year 0-2 = 0   mMRC dyspnea score 1 = I get short of breath when hurrying on level ground or walking up a slight hill.   Chronic Pseudomonas colonization (at least 2 positive cultures at least 3 months apart within 1 year) Yes = 3   Colonization with other potential pathogenic bacteria (at least 2 positive cultures at least 3 months apart within 1 year) No = 0   Radiologic Severity 3 or more lobes or cystic brx = 1       Total Score 14     BSI score of 9+ = Severe bronchiectasis (1 year outcomes 7.6 - 10.5% mortality, 16.7 - 52.6% hospitalization rate; 4 year outcomes 9.9 - 29.2% mortality, 41.2 - 80.4% hospitalization)    [name] et al. The Bronchiectasis Severity Index: An International Derivation and Validation Study. AJRCCM, 2013; 189(5): C339114.)

## 2017-07-07 NOTE — Unmapped (Signed)
Addended by: Viona Gilmore on: 07/07/2017 04:32 PM     Modules accepted: Orders

## 2017-07-07 NOTE — Unmapped (Signed)
[facility] Pharmacist Note     Primary pulmonologist: Newton Picket is a 62 y.o. male with bronchiectasis, seen by CPP for medication review and assessment.   He is currently on cycle for inhaled colistin; Reports tolerating colistin and all other inhaled medications.  Most recently hospitalized end of Jan 2019, and received IV antibiotics for bronchiectasis exacerbation. He only did 2-weeks but for future courses, his usual 3-week course tends to keep him out of hospital longer. He is interested in discussing getting Port placement due to issues with PICC line placement this last admission.    Patient reported adherence:  Airway clearance medications: QID (albuterol/HTS 7%), alternating months of inhaled colistin and inhaled tobramycin, Symbicort BID with spacer  Chronic medications: Does not miss any doses of chronic medications    Patient identified barriers to adherence:  Not applicable    Sputum culture history: Mucoid Pseudomonas aeruginosa, Smooth Pseudomonas aeruginosa, OPF    FEV1: 44.9% (today)    Medication Review   Reviewed the indication, dose, and frequency of each medication with patient.   All medications, allergies, and preferred pharmacy list were updated in EPIC medication profile    Current Outpatient Medications   Medication Sig   ??? albuterol (PROAIR HFA) 90 mcg/actuation inhaler Inhale 2 puffs every four (4) hours as needed for wheezing or shortness of breath.   ??? albuterol 2.5 mg /3 mL (0.083 %) nebulizer solution Inhale 2.5 mg by nebulization Four (4) times a day.   ??? alcohol swabs PadM Use as directed with inhaled antibiotics   ??? ascorbic acid (CHEWABLE VITAMIN C) 250 mg Chew Chew 1,000 mg Two (2) times a day.    ??? azithromycin (ZITHROMAX) 250 MG tablet Take 1 tablet (250 mg total) by mouth daily.   ??? BENEFIBER, GUAR GUM, ORAL Take 1 Tbsp by mouth Two (2) times a day.    ??? budesonide-formoterol (SYMBICORT) 160-4.5 mcg/actuation inhaler Inhale 2 puffs Two (2) times a day.   ??? CIALIS 20 mg tablet    ??? colistimethate (COLYMYCIN) 150 mg injection Inhale 2 mL (150 mg total) Two (2) times a day. Inject 2mL SWFI into colistin vial, then withdraw 2mL (150mg ) for dose.   ??? ferrous sulfate (IRON, FERROUS SULFATE,) 325 (65 FE) MG tablet Take 325 mg by mouth Two (2) times a day.   ??? HYDROcodone-acetaminophen (NORCO) 7.5-325 mg per tablet Take 1 tablet by mouth three (3) times a day (at 6am, noon and 6pm).   ??? HYDROcodone-homatropine (HYCODAN) 5-1.5 mg/5 mL syrup Take 5 mL by mouth every four (4) hours as needed for cough.   ??? omeprazole (PRILOSEC) 20 MG capsule Take 20 mg by mouth Two (2) times a day.    ??? sodium chloride 7% 7 % Nebu Inhale 4 mL by nebulization 4 (four) times a day.   ??? sterile water, PF, Soln Use 2mL to mix colistin, add 2mL more to neb cup with 2mL of mixed colistin,nebulize twice a day   ??? syringe with needle (SYRINGE 3CC/20GX1) 3 mL 20 gauge x 1 Syrg For use with inhaled antibiotic   ??? testosterone cypionate (DEPOTESTOTERONE CYPIONATE) 200 mg/mL injection Inject 1mL (200mg ) every other week   ??? tobramycin, PF, ([name]) 300 mg/5 mL nebulizer solution INHALE CONTENTS OF 1 AMP (300MG ) VIA NEBULIZER EVERY 12 HOURS FOR 28 DAYS ON, THEN 28 DAYS OFF. REFILL: [phone]       Assessment/Recommendations     Bronchiectasis:  -Patient has  Moderate lung disease. Lung function stable.   -Airway clearance medications as above.  -No changes were recommended today, continue current medications.      Pertinent drug Interactions noted: No   Recommended labs to be monitored: No     Medication management:  Adherence: Excellent compliance  Access: No issues noted in obtaining medications.  Prescription Renewals: None requested     Time spent with patient: 30 minutes    Anell Barr, PharmD, BCPS, CPP  Clinical Pharmacist Practitioner  [facility]  [facility]  Pager: [phone]

## 2017-07-08 DIAGNOSIS — E291 Testicular hypofunction: Secondary | ICD-10-CM | POA: Diagnosis not present

## 2017-07-14 DIAGNOSIS — I10 Essential (primary) hypertension: Secondary | ICD-10-CM | POA: Diagnosis not present

## 2017-07-14 DIAGNOSIS — R8290 Unspecified abnormal findings in urine: Secondary | ICD-10-CM | POA: Diagnosis not present

## 2017-07-14 DIAGNOSIS — E785 Hyperlipidemia, unspecified: Secondary | ICD-10-CM | POA: Diagnosis not present

## 2017-07-14 DIAGNOSIS — Z0000 Encounter for general adult medical examination without abnormal findings: Secondary | ICD-10-CM | POA: Diagnosis not present

## 2017-07-23 DIAGNOSIS — D751 Secondary polycythemia: Secondary | ICD-10-CM | POA: Diagnosis not present

## 2017-07-23 DIAGNOSIS — E291 Testicular hypofunction: Secondary | ICD-10-CM | POA: Diagnosis not present

## 2017-08-05 MED ORDER — AZITHROMYCIN 250 MG TABLET
ORAL_TABLET | Freq: Every day | ORAL | 11 refills | 0 days | Status: CP
Start: 2017-08-05 — End: 2018-08-01

## 2017-08-06 DIAGNOSIS — E291 Testicular hypofunction: Secondary | ICD-10-CM | POA: Diagnosis not present

## 2017-08-09 NOTE — Unmapped (Signed)
[facility] Refill Coordination Note    Specialty Medication(s) to be Shipped:   COLISTIN 150MG  INJ        Other medication(s) to be shipped: STERILE WATER  ALCOHOL PADS  SYRINGES W/NEEDLE  NEB CUP     MABEL UNREIN, DOB: [birth year]  Phone: [phone] (home)   Shipping Address: [address]  [place] [address]    All above HIPAA information was verified with patient.     Completed refill call assessment today to schedule patient's medication shipment from the [facility] [phone]).       Specialty medication(s) and dose(s) confirmed: Regimen is correct and unchanged.   Changes to medications: Mccartney reports no changes reported at this time.  Changes to insurance: No  Questions for the pharmacist: No    The patient will receive an FSI print out for each medication shipped and additional FDA Medication Guides as required.  Patient education from [place] or Robet Leu may also be included in the shipment.    DISEASE-SPECIFIC INFORMATION        N/A    ADHERENCE     Medication Adherence    Patient reported X missed doses in the last month:  0  Specialty Medication:  COLISTIN  Informant:  patient  Reliability of informant:  reliable  Support network for adherence:  family member          Refill Coordination    Has the Patients' Contact Information Changed:  No  Is the Shipping Address Different:  No         MEDICARE PART B DOCUMENTATION     Not Applicable    SHIPPING     Shipping address confirmed in FSI.     Delivery Scheduled: Yes, Expected medication delivery date: 08/13/17 via UPS or courier.     Lajean Silvius   [facility] Specialty Technician

## 2017-08-10 DIAGNOSIS — R972 Elevated prostate specific antigen [PSA]: Secondary | ICD-10-CM | POA: Diagnosis not present

## 2017-08-12 MED FILL — STERILE WATER INJ//10ML: STERILE WATER INJ//10ML | 30 days supply | Qty: 60 | Fill #3

## 2017-08-12 MED FILL — ALCOHOL PADS//: ALCOHOL PADS// | 50 days supply | Qty: 1 | Fill #5

## 2017-08-12 MED FILL — BD 3ML LUER-LOK SYRINGE 2/20GX1.5/MISC: BD 3ML LUER-LOK SYRINGE 2/20GX1.5/MISC | 30 days supply | Qty: 60 | Fill #3

## 2017-08-12 MED FILL — PARI LC PLUS NEBULIZER SET//MISC: PARI LC PLUS NEBULIZER SET//MISC | 30 days supply | Qty: 1 | Fill #3

## 2017-08-12 MED FILL — COLISTIMETHATE/150MG/INJ: COLISTIMETHATE/150MG/INJ | 30 days supply | Qty: 60 | Fill #4

## 2017-08-17 DIAGNOSIS — E349 Endocrine disorder, unspecified: Secondary | ICD-10-CM | POA: Diagnosis not present

## 2017-08-17 DIAGNOSIS — C61 Malignant neoplasm of prostate: Secondary | ICD-10-CM | POA: Diagnosis not present

## 2017-08-18 MED ORDER — ALBUTEROL SULFATE HFA 90 MCG/ACTUATION AEROSOL INHALER
RESPIRATORY_TRACT | 3 refills | 0 days | Status: CP | PRN
Start: 2017-08-18 — End: 2018-08-03

## 2017-08-27 DIAGNOSIS — D225 Melanocytic nevi of trunk: Secondary | ICD-10-CM | POA: Diagnosis not present

## 2017-08-27 DIAGNOSIS — Z85828 Personal history of other malignant neoplasm of skin: Secondary | ICD-10-CM | POA: Diagnosis not present

## 2017-08-27 DIAGNOSIS — L570 Actinic keratosis: Secondary | ICD-10-CM | POA: Diagnosis not present

## 2017-09-01 MED ORDER — SODIUM CHLORIDE 7 % FOR NEBULIZATION
Freq: Four times a day (QID) | RESPIRATORY_TRACT | 3 refills | 0.00000 days | Status: CP
Start: 2017-09-01 — End: 2018-09-01

## 2017-09-02 ENCOUNTER — Encounter: Payer: Self-pay | Admitting: Radiation Oncology

## 2017-09-02 NOTE — Progress Notes (Signed)
GU Location of Tumor / Histology: prostatic adenocarcinoma  If Prostate Cancer, Gleason Score is (3 + 4) and PSA is (2.78) on 06/2017. Prostate volume: 29 mL.   [name] was referred by Dr. Aura Dials to Dr. Tresa Moore for further evaluation of an elevated PSA in April 2019.   Biopsies of prostate (if applicable) revealed:    Past/Anticipated interventions by urology, if any: prostate biopsy, encouraged patient to hold androgens, CT abd/pelvis=no abnormalities, referral to radiation oncology, scheduled to follow up with Dr. Tresa Moore July 1st  Past/Anticipated interventions by medical oncology, if any: no  Weight changes, if any: no  Bowel/Bladder complaints, if any: Reports frequency and nocturia. Denies dysuria or hematuria. Reports rare urinary leakage associated with urgency.   Nausea/Vomiting, if any: no  Pain issues, if any:  Reports discomfort in the prostate area after prolonged sitting. Describes the pain as dull aches.   SAFETY ISSUES:  Prior radiation? no  Pacemaker/ICD? no  Possible current pregnancy? no  Is the patient on methotrexate? no  Current Complaints / other details:  62 year old male. Married with one daughter and one son. Maternal Grandfather with hx of prostate cancer. Reports three of his maternal cousins have prostate cancer.

## 2017-09-06 ENCOUNTER — Other Ambulatory Visit: Payer: Self-pay

## 2017-09-06 ENCOUNTER — Encounter: Payer: Self-pay | Admitting: Radiation Oncology

## 2017-09-06 ENCOUNTER — Ambulatory Visit
Admission: RE | Admit: 2017-09-06 | Discharge: 2017-09-06 | Disposition: A | Discharge status: Home / Self Care | Payer: 59 | Source: Ambulatory Visit | Attending: Radiation Oncology | Admitting: Radiation Oncology

## 2017-09-06 ENCOUNTER — Encounter: Payer: Self-pay | Admitting: Medical Oncology

## 2017-09-06 VITALS — BP 134/83 | HR 90 | Temp 98.2°F | Resp 19 | Wt 199.0 lb

## 2017-09-06 DIAGNOSIS — K219 Gastro-esophageal reflux disease without esophagitis: Secondary | ICD-10-CM | POA: Diagnosis not present

## 2017-09-06 DIAGNOSIS — J479 Bronchiectasis, uncomplicated: Secondary | ICD-10-CM | POA: Insufficient documentation

## 2017-09-06 DIAGNOSIS — C61 Malignant neoplasm of prostate: Secondary | ICD-10-CM | POA: Diagnosis not present

## 2017-09-06 DIAGNOSIS — Z885 Allergy status to narcotic agent status: Secondary | ICD-10-CM | POA: Diagnosis not present

## 2017-09-06 DIAGNOSIS — Z79899 Other long term (current) drug therapy: Secondary | ICD-10-CM | POA: Insufficient documentation

## 2017-09-06 DIAGNOSIS — G4733 Obstructive sleep apnea (adult) (pediatric): Secondary | ICD-10-CM | POA: Diagnosis not present

## 2017-09-06 DIAGNOSIS — Z7951 Long term (current) use of inhaled steroids: Secondary | ICD-10-CM | POA: Diagnosis not present

## 2017-09-06 DIAGNOSIS — Z8042 Family history of malignant neoplasm of prostate: Secondary | ICD-10-CM | POA: Diagnosis not present

## 2017-09-06 DIAGNOSIS — I10 Essential (primary) hypertension: Secondary | ICD-10-CM | POA: Insufficient documentation

## 2017-09-06 DIAGNOSIS — R972 Elevated prostate specific antigen [PSA]: Secondary | ICD-10-CM | POA: Diagnosis not present

## 2017-09-06 HISTORY — DX: Malignant neoplasm of prostate: C61

## 2017-09-06 NOTE — Progress Notes (Signed)
Introduced myself to patient and his wife as the Art therapist and my role. He is interested in surgery but was encouraged him learn about his radiation treatment options. His maternal grandfather had prostate cancer and three of his cousins have prostate cancer. He states this did not come as a surprise with his family history. He will follow up with Dr. Tresa Moore in July to discuss his final decision. I asked him to call if I can answer questions or be of assistance.

## 2017-09-06 NOTE — Progress Notes (Signed)
Radiation Oncology         [phone] ________________________________  Initial outpatient Consultation  Name: [name] MRN: [id number]  Date: 09/06/2017  DOB: August 27, 1955  ME:QASTM, Doreene Burke, MD  Alexis Frock, MD   REFERRING PHYSICIAN: Alexis Frock, MD  DIAGNOSIS: 62 y.o. gentleman with intermediate risk, Stage T1c adenocarcinoma of the prostate with Gleason Score of 3+4, and PSA of 2.78    ICD-10-CM   1. Malignant neoplasm of prostate ([place]) C61     HISTORY OF PRESENT ILLNESS: [name] is a 62 y.o. male with a diagnosis of prostate cancer. He was noted to have an elevated PSA of 2.78 by his primary care physician, Dr. Sheryn Bison. He has been on testosterone supplementation, and over time this had increased from baseline in the .88 range in 2014.  Accordingly, he was referred for evaluation in urology by Dr. Tresa Moore on 07/01/17,  digital rectal examination was performed at that time revealing a smooth prostate without nodules.  The patient proceeded to transrectal ultrasound with 12 biopsies of the prostate on 08/10/17.  The prostate volume measured 29 cc.  Out of 12 core biopsies,6 were positive.  The maximum Gleason score was 3+4=7, and this was seen in the right mid lateral gland.The patient reviewed the biopsy results with his urologist and he has kindly been referred today for discussion of potential radiation treatment options. Of note he has discontinued his testosterone supplementation. He also has a history of Bronchiectasis and has RML syndrome. As a result of multiple episodes of pneumonia he underwent RLL and RML lobectomies in 2012. He sees Pulmonology at [facility] and while he's been given permission to consider surgery, he is at high risk of pneumonia as a result. He spends about 4 1/2 hours per day using nebulizer treatments.  PREVIOUS RADIATION THERAPY: No  PAST MEDICAL HISTORY:  Past Medical History:  Diagnosis Date  . Bronchiectasis ([place])   . Bronchiolitis   .  Dizziness and giddiness 07/26/2012  . DJD (degenerative joint disease)   . Gastroesophageal reflux disease   . HTN (hypertension)   . OSA (obstructive sleep apnea)   . Pneumonia   . Pneumonia with lung abscess ([place])   . Prostate cancer ([place])   . Tear of left rotator cuff       PAST SURGICAL HISTORY: Past Surgical History:  Procedure Laterality Date  . APPENDECTOMY  1969  . BACK SURGERY     multiple, DJD, cervical fusion, plate removed  . CARPAL TUNNEL RELEASE Right 1999  . COLONOSCOPY  12/1980   bleeding from penis and anus after beach trip; colonoscopy and removed urinary stricture at same time  . KNEE ARTHROSCOPY     left  . LUNG SURGERY  2012,    removed right middle lobe,due to birth defect, removed half of right bottom lobe due to abcess  . NECK SURGERY  1962,2297, 2002  . OTHER SURGICAL HISTORY     removed 4" of lower right rib  . OTHER SURGICAL HISTORY     has had 8 PICC lines placed over the course of several years.  . Right video-assisted thoracoscopic surgery,  12/05/2010  . ROTATOR CUFF REPAIR     no surgery, because of lung infection  . TONSILLECTOMY  1965  . VIDEO BRONCHOSCOPY     Dr Arlyce Dice    FAMILY HISTORY:  Family History  Problem Relation Age of Onset  . Stroke Mother   . Alzheimer's disease Mother   . Transient ischemic attack  Mother   . Chorea Mother   . Liver disease Father   . Diabetes Father   . Congestive Heart Failure Father   . Other Other        sibling with possible right middle lobe issues  . Ataxia Brother   . Ataxia Maternal Aunt   . Prostate cancer Maternal Grandfather   . Prostate cancer Cousin   . Prostate cancer Cousin   . Prostate cancer Cousin     SOCIAL HISTORY:  Social History   Socioeconomic History  . Marital status: Married    Spouse name: Jackelyn Poling  . Number of children: 2  . Years of education: college  . Highest education level: Not on file  Occupational History  . 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 Financial resource strain: Not on file  . Food insecurity:    Worry: Not on file    Inability: Not on file  . Transportation needs:    Medical: Not on file    Non-medical: Not on file  Tobacco Use  . Smoking status: Never Smoker  . Smokeless tobacco: Never Used  Substance and Sexual Activity  . Alcohol use: No    Comment: quit in 1994  . Drug use: No  . Sexual activity: Not Currently    Comment: suffers from erectile dysfunction  Lifestyle  . Physical activity:    Days per week: Not on file    Minutes per session: Not on file  . Stress: Not on file  Relationships  . Social connections:    Talks on phone: Not on file    Gets together: Not on file    Attends religious service: Not on file    Active member of club or organization: Not on file    Attends meetings of clubs or organizations: Not on file    Relationship status: Not on file  . Intimate partner violence:    Fear of current or ex partner: Not on file    Emotionally abused: Not on file    Physically abused: Not on file    Forced sexual activity: Not on file  Other Topics Concern  . Not on file  Social History Narrative   Patient is married (Debbie) and lives at home with his wife.   Patient has two children, one daughter and one son.   Patient has a college education.   No inside animals   Lives in [place], patient is right handed and , consumes 2 cups of coffee daily.  The patient is on disability due to his bronchiectasis.  ALLERGIES: Gentamicin; Morphine and related; and Tobramycin  MEDICATIONS:  Current Outpatient Medications  Medication Sig Dispense Refill  . albuterol (PROVENTIL HFA;VENTOLIN HFA) 108 (90 Base) MCG/ACT inhaler Inhale into the lungs every 6 (six) hours as needed for wheezing or shortness of breath.    [name] albuterol (PROVENTIL) (2.5 MG/3ML) 0.083% nebulizer solution Inhale into the lungs.    [name] azithromycin (ZITHROMAX) 250 MG tablet Take by mouth.    .  budesonide-formoterol (SYMBICORT) 160-4.5 MCG/ACT inhaler Inhale 2 puffs into the lungs 2 (two) times daily.    . colistimethate (COLYMYCIN) 150 MG injection Inhale into the lungs.    . dornase alpha (PULMOZYME) 1 MG/ML nebulizer solution Inhale into the lungs.    . Ferrous Sulfate (IRON) 325 (65 Fe) MG TABS Take by mouth.    [name] HYDROcodone-acetaminophen (NORCO) 7.5-325 MG tablet Take by mouth.    [name] HYDROcodone-homatropine (HYCODAN)  5-1.5 MG/5ML syrup Take 5 mLs by mouth every 6 (six) hours as needed for cough. 240 mL 0  . NON FORMULARY Use Acapella twice daily for airway clearance. Dx. 494.0. Mucous clearing device.    . NON FORMULARY Provide 1 device  for use with inhaled medication. Pari LC D Nebulizer    . NON FORMULARY syringe with needle 3 mL 20 gauge x 1" Syrg    . omeprazole (PRILOSEC) 20 MG capsule Take 20 mg by mouth.    . Respiratory Therapy Supplies (FLUTTER) DEVI Use several times daily as needed for congestion/thick mucus    . Sodium Chloride, Inhalant, 7 % NEBU Inhale into the lungs.    . tadalafil (CIALIS) 20 MG tablet     . tobramycin, PF, ([name]) 300 MG/5ML nebulizer solution Take 300 mg by nebulization 2 (two) times daily.    [name] ULTICARE ALCOHOL SWABS PADS Use as directed with inhaled antibiotics    . vitamin C (ASCORBIC ACID) 250 MG tablet Take 250 mg by mouth daily.    . Water For Irrigation, Sterile (STERILE WATER FOR IRRIGATION) Irrigate with as directed once.    . Wheat Dextrin (BENEFIBER PO) Take by mouth.     No current facility-administered medications for this encounter.     REVIEW OF SYSTEMS:  On review of systems, the patient reports that he is doing well overall given his baseline. He is tired since discontining testosterone. He denies any chest pain, shortness of breath at rest but is fatigued with breathing at times. He does have a productive cough after using neb treatments and in the morning. He denies fevers, chills, night sweats, unintended weight changes. He  denies any bowel disturbances, and denies abdominal pain, nausea or vomiting. He denies any new musculoskeletal or joint aches or pains. His IPSS was 13, indicating moderate urinary symptoms. He is able to complete sexual activity with some attempts. A complete review of systems is obtained and is otherwise negative.    PHYSICAL EXAM:  Wt Readings from Last 3 Encounters:  09/06/17 199 lb (90.3 kg)  06/02/13 210 lb 3.2 oz (95.3 kg)  05/17/13 205 lb (93 kg)   Temp Readings from Last 3 Encounters:  09/06/17 98.2 F (36.8 C) (Oral)  06/02/13 97.8 F (36.6 C) (Temporal)  04/06/12 98 F (36.7 C) (Oral)   BP Readings from Last 3 Encounters:  09/06/17 134/83  06/02/13 128/81  05/17/13 137/85   Pulse Readings from Last 3 Encounters:  09/06/17 90  06/02/13 72  05/17/13 81   Pain Assessment Pain Score: 0-No pain/10  In general this is a well appearing caucasian male in no acute distress. He is alert and oriented x4 and appropriate throughout the examination. HEENT reveals that the patient is normocephalic, atraumatic. EOMs are intact. PERRLA. Skin is intact without any evidence of gross lesions. Cardiovascular exam reveals a regular rate and rhythm, no clicks rubs or murmurs are auscultated. Chest is clear to auscultation though there are a few coarse breath sounds bilaterally. Lymphatic assessment is performed and does not reveal any adenopathy in the cervical, supraclavicular, axillary, or inguinal chains. Abdomen has active bowel sounds in all quadrants and is intact. The abdomen is soft, non tender, non distended. Lower extremities are negative for pretibial pitting edema, deep calf tenderness, cyanosis or clubbing.   KPS = 70 100 - Normal; no complaints; no evidence of disease. 90   - Able to carry on normal activity; minor signs or symptoms of disease. 80   -  Normal activity with effort; some signs or symptoms of disease. 3   - Cares for self; unable to carry on normal activity or to  do active work. 60   - Requires occasional assistance, but is able to care for most of his personal needs. 50   - Requires considerable assistance and frequent medical care. 63   - Disabled; requires special care and assistance. 64   - Severely disabled; hospital admission is indicated although death not imminent. 52   - Very sick; hospital admission necessary; active supportive treatment necessary. 10   - Moribund; fatal processes progressing rapidly. 0     - Dead  Karnofsky DA, [place], Craver LS and [facility] [phone]) The use of the nitrogen mustards in the palliative treatment of carcinoma: with particular reference to bronchogenic carcinoma Cancer 1 634-56  LABORATORY DATA:  Lab Results  Component Value Date   WBC 4.6 07/26/2012   HGB 15.5 07/26/2012   HCT 45.2 07/26/2012   MCV 85 07/26/2012   PLT 140 (L) 07/26/2012   Lab Results  Component Value Date   NA 137 07/26/2012   K 3.9 07/26/2012   CL 98 07/26/2012   CO2 24 07/26/2012   Lab Results  Component Value Date   ALT 18 07/26/2012   AST 18 07/26/2012   ALKPHOS 63 07/26/2012   BILITOT 0.6 07/26/2012     RADIOGRAPHY: No results found.    IMPRESSION/PLAN: 1. 62 y.o. gentleman with intermediate risk, Stage T1c adenocarcinoma of the prostate with Gleason Score of 3+4, and PSA of 2.78. We met with the patient today and his wife to review options of treatment for his prostate cancer. We reviewed the T stage, Gleason Score, and PSA as it pertains to treatment guidance. He is elligible for prostatectomy or seed implant, however his surgical risks are to be considered given his bronchiectasis. He is also a candidate for external beam radiotherapy. After discussing the risks, benefits, short, and long term effects of therapy and contrasting that to prostatectomy, the patient is leaning toward external beam radiotherapy. We discussed the delivery and logistics as well as preparation for treatment with fiducial markers and  spaceOAR gel as well. We will follow up with him in about 1 week to see if he has made a decision on how he'd like to proceed.  2. Bronchiectasis. As above, while surgery would not be prohibitive, external beam treatment would certainly be the least invasive to his lung function. We will follow up with him by phone to determine his decision to move forward. 3. Possible genetic predisposition to malignancy. The patient is a candidate for genetic testing given his personal and family history. He was offered referral and is interested in referral. This will be coordinated for him.     Carola Rhine, [facility]   Page Me  Seen with  _____________________________________  Sheral Apley Tammi Klippel, M.D.  This document serves as a record of services personally performed by Tyler Pita, MD and Shona Simpson, PA-C. It was created on their behalf by Linward Natal, a trained medical scribe. The creation of this record is based on the scribe's personal observations and the provider's statements to them. This document has been checked and approved by the attending provider.

## 2017-09-06 NOTE — Progress Notes (Signed)
See progress note under physician encounter. 

## 2017-09-07 ENCOUNTER — Other Ambulatory Visit: Payer: Self-pay | Admitting: Radiation Oncology

## 2017-09-07 DIAGNOSIS — R194 Change in bowel habit: Secondary | ICD-10-CM | POA: Diagnosis not present

## 2017-09-07 DIAGNOSIS — C61 Malignant neoplasm of prostate: Secondary | ICD-10-CM

## 2017-09-09 ENCOUNTER — Telehealth: Payer: Self-pay | Admitting: Genetic Counselor

## 2017-09-09 DIAGNOSIS — C61 Malignant neoplasm of prostate: Secondary | ICD-10-CM | POA: Insufficient documentation

## 2017-09-09 NOTE — Telephone Encounter (Signed)
Scheduled apt per 6/19 sch message - left message for patient with appt date and time.

## 2017-09-15 DIAGNOSIS — D7589 Other specified diseases of blood and blood-forming organs: Secondary | ICD-10-CM | POA: Diagnosis not present

## 2017-09-20 ENCOUNTER — Telehealth: Payer: Self-pay | Admitting: Radiation Oncology

## 2017-09-20 DIAGNOSIS — E349 Endocrine disorder, unspecified: Secondary | ICD-10-CM | POA: Diagnosis not present

## 2017-09-20 NOTE — Telephone Encounter (Signed)
I called pt to follow up since our meeting to see if he had questions or had made a decision for treatment.

## 2017-09-21 ENCOUNTER — Other Ambulatory Visit: Payer: Self-pay | Admitting: Urology

## 2017-09-22 DIAGNOSIS — D7589 Other specified diseases of blood and blood-forming organs: Secondary | ICD-10-CM | POA: Diagnosis not present

## 2017-09-27 DIAGNOSIS — J479 Bronchiectasis, uncomplicated: Secondary | ICD-10-CM | POA: Diagnosis not present

## 2017-09-28 ENCOUNTER — Encounter: Payer: Self-pay | Admitting: Medical Oncology

## 2017-09-28 NOTE — Progress Notes (Signed)
Per Dr. Zettie Pho office note 7/01, patient has decided on robotic prostatectomy to treat his prostate cancer.

## 2017-09-28 NOTE — Unmapped (Signed)
On Saturday 09/25/17 having nasal drainage, a coughing spell with vomiting. Some fever. Not sleeping well.   Seen PCP yesterday 09/27/17. [employer]. He stated. MD placed him on  Levaquin 750mg  for 14 days Md stated a pseudomas flare  . Will see you 2 wks. Jordan [name] can be reached @ [phone].

## 2017-09-29 ENCOUNTER — Inpatient Hospital Stay: Payer: 59

## 2017-09-29 ENCOUNTER — Inpatient Hospital Stay: Payer: 59 | Admitting: Genetic Counselor

## 2017-09-29 MED ORDER — HYDROCODONE-HOMATROPINE 5 MG-1.5 MG/5 ML ORAL SYRUP
ORAL | 0 refills | 0.00000 days | Status: CP | PRN
Start: 2017-09-29 — End: 2017-10-12

## 2017-10-01 DIAGNOSIS — D7589 Other specified diseases of blood and blood-forming organs: Secondary | ICD-10-CM | POA: Diagnosis not present

## 2017-10-06 DIAGNOSIS — D7589 Other specified diseases of blood and blood-forming organs: Secondary | ICD-10-CM | POA: Diagnosis not present

## 2017-10-12 ENCOUNTER — Encounter: Admit: 2017-10-12 | Discharge: 2017-10-12 | Payer: MEDICARE | Attending: Internal Medicine | Primary: Internal Medicine

## 2017-10-12 ENCOUNTER — Ambulatory Visit: Admit: 2017-10-12 | Discharge: 2017-10-12 | Payer: MEDICARE

## 2017-10-12 ENCOUNTER — Encounter: Admit: 2017-10-12 | Discharge: 2017-10-12 | Payer: MEDICARE

## 2017-10-12 ENCOUNTER — Ambulatory Visit: Admit: 2017-10-12 | Discharge: 2017-10-12 | Payer: MEDICARE | Attending: Internal Medicine | Primary: Internal Medicine

## 2017-10-12 DIAGNOSIS — A498 Other bacterial infections of unspecified site: Secondary | ICD-10-CM | POA: Diagnosis not present

## 2017-10-12 DIAGNOSIS — J479 Bronchiectasis, uncomplicated: Secondary | ICD-10-CM | POA: Diagnosis not present

## 2017-10-12 MED ORDER — HYDROCODONE-HOMATROPINE 5 MG-1.5 MG/5 ML ORAL SYRUP
ORAL | 0 refills | 0 days | Status: CP | PRN
Start: 2017-10-12 — End: 2018-01-18

## 2017-10-12 NOTE — Unmapped (Signed)
Vest and nebulizer.    I will have Kelli or Joni Reining call to set up a time to do the nasal scrape.    Symptoms of a bronchiectasis exacerbation: 48 hours of at least three of the following:  ?? Increased cough  ?? Change in volume or appearance of sputum  ?? Increased sputum purulence  ?? Worsening shortness of breath and/or exercise tolerance  ?? Fatigue and/or malaise  ?? Coughing up blood (hemoptysis)    If you are experiencing some of these symptoms:  ?? Increase the frequency and/or intensity of your airway clearance  ?? Try to submit a sputum sample for bacterial and AFB cultures (at [facility] or locally)  ?? Reach out to me or your local physician.  ?? If you need antibiotics, recommend treating for 14 days.      Please bring any new airway clearance equipment to your next visit to ensure that you are using and caring for it properly.    I recommend receiving the standard dose quadrivalent influenza vaccine this fall.     Thank you for allowing me to be a part of your care. Please call the clinic with any questions.    Viona Gilmore, MD, MPH  Pulmonary and Critical Care Medicine  [address]  [id number]  [place], [place] [address]    Thank you for your visit to the [facility] Pulmonary Clinics. You may receive a survey from [facility] regarding your visit today, and we are eager to use this feedback to improve your experience. Thank you for taking the time to fill it out.    Between appointments, you can reach [place] at these numbers:    For appointments or the Pulmonary Nurse: [phone]  Fax: [phone]    For urgent issues after hours:  Hospital Operator: [phone], ask for Pulmonary Fellow on call    For further information, check out the websites below:    [facility] for Bronchiectasis: [email or web address]    World Bronchiectasis Conference in [place] Patient session October 03, 2016: Videos can be watched here.     Bronchiectasis Toolbox (information about airway clearance): DiningCalendar.de    Copy for Individuals with Bronchiectasis and/or NTM through the Bronchiectasis Registry and the COPD Foundation: https://www.mckee-young.org/    Information about Non-tuberculous Mycobacterial Infections:  https://www.ntminfo.org     Interested in clinical trials and other research opportunities?  www.clinicaltrials.gov  The Rare Diseases Clinical Research Network [facility]) Genetic Disorders of  Mucociliary Clearance Consortium [facility]) Contact Registry is a way for patients with disorders of mucociliary clearance (such as bronchiectasis) and their family members to learn about research studies they may be able to join. Participation is completely voluntary and you may choose to withdraw at any time. There is no cost to join the [employer]. For more information or to join the registry please go to the following website:  [email or web address]

## 2017-10-12 NOTE — Unmapped (Signed)
Lower Resp.Cul. And AFB Cul. Was collected, labeled and sent to lab. Bosie Clos, CMA

## 2017-10-12 NOTE — Unmapped (Signed)
[facility]  [facility]    Assessment:      Patient:Jordan [name] (1956/01/16)  Reason for visit: Jordan [name] is a 62 y.o.male who returns for post-hospitalization follow-up of bronchiectasis secondary to unknown cause (idiopathic) with Pseudomonas colonization s/p resection of RML and RLL, resection of right 11 and 12th ribs, and recently diagnosed prostate cancer.     Plan:      Problem List Items Addressed This Visit        Respiratory    Bronchiectasis (CMS-HCC)     Bronchiectasis Severity Index Score:    Age 1-69 = 2   BMI 18.5 or higher = 0   FEV1 % predicted 30-49% = 2   Hospitalized for severe exacerbation in past 2 years Yes = 5   Exacerbations in previous year 3 or more = 2   mMRC dyspnea score 1 = I get short of breath when hurrying on level ground or walking up a slight hill.   Chronic Pseudomonas colonization (at least 2 positive cultures at least 3 months apart within 1 year) Yes = 3   Colonization with other potential pathogenic bacteria (at least 2 positive cultures at least 3 months apart within 1 year) No = 0   Radiologic Severity 3 or more lobes or cystic brx = 1       Total Score 16     BSI score of 9+ = Severe bronchiectasis (1 year outcomes 7.6 - 10.5% mortality, 16.7 - 52.6% hospitalization rate; 4 year outcomes 9.9 - 29.2% mortality, 41.2 - 80.4% hospitalization)    [name] et al. The Bronchiectasis Severity Index: An International Derivation and Validation Study. AJRCCM, 2013; 189(5): 629-528.)         Relevant Medications    HYDROcodone-homatropine (HYCODAN) 5-1.5 mg/5 mL syrup    Other Relevant Orders    AFB SMEAR (Completed)       Other    Pseudomonas aeruginosa infection - Primary        Reinforced importance of regular airway clearance.  In bronchiectasis, there is impaired mucus clearance from both the airway changes and from the underlying cause of the bronchiectasis.  This impaired clearance leads to accumulation of mucus, resulting in inflammation, accumulation of bacteria, and worsening damage to the airways.  Therefore, augmentation of airway clearance is critical. Jordan [name] was provided websites with information about bronchiectasis, social forums for patients with bronchiectasis and NTM, and airway clearance techniques    Patient met with the respiratory therapist today to discuss airway clearance methods and trial different percussive Vest options. Patient should bring any new airway clearance devices to the next visit to review technique with the respiratory therapist.  No changes were made to inhaled medications.  He will continue to use his Pulmozyme daily and alternating months of inhaled antibiotics.  Jordan [name] would like to think about which percussive vest he would like me to order.    ?? Jordan [name] has bronchiectasis due to unknown cause as seen on chest CT done on 03/31/16 as well as resistant Mucoid Pseudomonas.  ?? Bronchiectasis has resulted in a chronic cough that has been present for over 6 months as well as 3 exacerbations requiring antibiotic treatment over the past 12 months.  ?? For airway clearance, he has tried and failed: Acapella/Flutter/PEP, Breathing Techniques/Huff Cough, Exercise, Nebulized Hypertonic Saline and Pulmozyme.  ?? These methods failed, are contraindicated, or inappropriate for the following reasons: Did Not Mobilize Secretions and No  Skilled Caregiver.  ?? Therefore, it is medically necessary for Jordan [name] to use a percussive Vest for airway clearance.     Patient spontaneously expectorated sputum that was sent for bacterial and AFB cultures. These results will help to guide antibiotic therapy in the future during exacerbations.  If he has future exacerbations, sputum should be sent for bacterial and AFB cultures. The bacterial culture should be processed like a cystic fibrosis sample given the overlapping pathogens. There are standing orders for these cultures in our system.    He asked me today about whether he is a lung transplant candidate and whether his prostate cancer diagnosis affects this.  Putting the prostate cancer diagnosis aside, he could be a transplant candidate but is early in his window (not needing supplemental oxygen). Because of his prior thoracic surgeries, lung transplant could be more complicated.  Typically, patients must be cancer free for 5 years before undergoing lung transplant but I told him I would speak to my transplant colleagues to see if this is true for prostate cancer.    Prior to Jordan [name]'s prostatectomy, it may be reasonable to administer IV antibiotics to reduce the risk of respiratory complications related to the surgery. I have spoken with his urologist, Dr. Berneice Heinrich previously and I am available to assist with managing his lung disease as it relates to the surgery.    Jordan [name] should receive the quadrivalent influenza vaccine this fall. He is up to date on pneumococcal vaccination, having received the PPSV 23 (Pneumovax) on 02/07/16 and PCV13 (Prevnar) on 04/28/13.  A repeat PPSV23 should be given on 02/06/2021 (1 year after PCV13 and 5 years post PPSV23). He is up to date for the Tdap vaccine.    Jordan [name] will return to clinic in 3 months for follow-up with pre-bronchodilator spirometry and sputum cultures.  He will call or send me a message via MyChart if questions or concerns arise before this visit.      Subjective:      HPI: Jordan [name] is a 62 y.o. male who returns for follow-up of bronchiectasis secondary to unknown cause (idiopathic) with Pseudomonas colonization.  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 He has recently been diagnosed with prostate cancer and is scheduled to undergo a prostatectomy in September.    Since the last clinic visit on 07/06/17:  ?? He had what sounds like a viral URI: temperature 103F, marked nasal congestion and drainage that increased cough. He would have coughing spells 2/2 nasal drainage that resulted in post-tussive emesis, which contained lots of mucus. He lost 5-8 pounds during exacerbation and had a poor appetite. He did receive 14 days of antibiotics.  ?? Cough: productive of light, thin sputum since starting the Pulmozyme. Sputum never became thick during exacerbation.  ?? Wheezing: absent.  ?? Chest tightness: less present.  ?? Using rescue albuterol 2-4 times per day if outside with heat and humidity.  ?? Pleurisy: absent.  ?? Hemoptysis: Absent    In the past year:  ?? he has been treated for an exacerbation 3 times with antibiotics (02/07/17, 04/14/17, 09/27/17)  ?? he has been admitted for an exacerbation.  His last hospitalization was 04/14/17; prior to that it was 01/22/16.    Pulmonary therapies:  ?? Airway clearance: Aerobika 4 times per day  and Hypertonic Saline 7% 4 times per day.  Also using Pulmozyme once a day.  Inhaled antibiotics: Alternating 28 day cycles with [name] and Colistin. Currently on [name]. No difference in symptoms when on one vs the other.  ?? Exercise: Walking  ?? Pulmonary rehab: Hasn't ever attended  ?? MMRC of 1 = I get short of breath when hurrying on level ground or walking up a slight hill.    ?? Jordan [name] is not on home oxygen therapy.      Review of Systems  He is scheduled for a complete prostatectomy on Sept 18. Currently having rectal pain that he attributes to a hemorrhoid. Remainder of a complete review of systems was negative unless mentioned above.    Past Medical History:   Diagnosis Date   ??? Abscess of lung (CMS-HCC) 11/06/2010    Overview:  See CT Chest 10/22/10 12/05/2010 right thoracotomy resection of right middle lobe and resection of right lower lobe abscess  Last Assessment & Plan:  See CT Chest 10/22/10 12/05/2010 right thoracotomy resection of right middle lobe and resection of right lower lobe abscess  He is at risk of recurrent bronchiectasis/ abscess so ok to rx with prn augmentin cycles     ??? Arthritis     back   ??? Biceps tendon tear 2013    left side   ??? Bronchiectasis (CMS-HCC)     chronic psuedomonas infection   ??? Degenerative joint disease of left acromioclavicular joint    ??? GERD (gastroesophageal reflux disease)    ??? Hypertension    ??? Obstructive sleep apnea on CPAP 03/14/13    AHI 33.5, on BiPAP 12/8 based on sleep study 09/2013   ??? Pneumonia 2012    Lung abcess    ??? PONV (postoperative nausea and vomiting)    ??? Rotator cuff injury left   ??? Vertigo      Current Outpatient Medications   Medication Sig Dispense Refill   ??? albuterol 2.5 mg /3 mL (0.083 %) nebulizer solution Inhale 2.5 mg by nebulization Four (4) times a day.     ??? albuterol HFA 90 mcg/actuation inhaler Inhale 2 puffs every four (4) hours as needed for wheezing or shortness of breath. 3 Inhaler 3   ??? alcohol swabs PadM Use as directed with inhaled antibiotics 100 each 5   ??? ascorbic acid (CHEWABLE VITAMIN C) 250 mg Chew Chew 1,000 mg Two (2) times a day.      ??? azithromycin (ZITHROMAX) 250 MG tablet Take 1 tablet (250 mg total) by mouth daily. 30 tablet 11   ??? BENEFIBER, GUAR GUM, ORAL Take 1 Act by mouth Two (2) times a day. Take 1 tablespoon BID     ??? budesonide-formoterol (SYMBICORT) 160-4.5 mcg/actuation inhaler Inhale 2 puffs Two (2) times a day. 3 Inhaler 1   ??? CIALIS 20 mg tablet      ??? colistimethate (COLYMYCIN) 150 mg injection Inhale 2 mL (150 mg total) Two (2) times a day. Inject 2mL SWFI into colistin vial, then withdraw 2mL (150mg ) for dose. 60 vial 5   ??? dornase alfa (PULMOZYME) 1 mg/mL nebulizer solution Inhale 2.5 mg daily. Use at least 30-60 minutes before airway clearance, or after airway clearance 75 mL 11   ??? ferrous sulfate (IRON, FERROUS SULFATE,) 325 (65 FE) MG tablet Take 325 mg by mouth Two (2) times a day.     ??? HYDROcodone-acetaminophen (NORCO) 7.5-325 mg per tablet Take 1 tablet by mouth three (3) times a day (  at 6am, noon and 6pm).     ??? HYDROcodone-homatropine (HYCODAN) 5-1.5 mg/5 mL syrup Take 5 mL by mouth every four (4) hours as needed for cough. 120 mL 0   ??? mucus clearing device Devi Use Acapella twice daily for airway clearance. Dx. 494.0. 1 Device 0   ??? omeprazole (PRILOSEC) 20 MG capsule Take 20 mg by mouth Two (2) times a day.      ??? PARI LC D NEBULIZER Misc Provide 1 device  for use with inhaled medication. 1 each 11   ??? sodium chloride 7% 7 % Nebu Inhale 4 mL by nebulization 4 (four) times a day. 1440 mL 3   ??? sterile water, PF, Soln Use 2mL to mix colistin, add 2mL more to neb cup with 2mL of mixed colistin,nebulize twice a day 60 ampule 5   ??? syringe with needle (SYRINGE 3CC/20GX1) 3 mL 20 gauge x 1 Syrg For use with inhaled antibiotic 100 Syringe 5   ??? testosterone cypionate (DEPOTESTOTERONE CYPIONATE) 200 mg/mL injection Frequency:QOW   Dosage:0.0     Instructions:  Note:Dose: 200 MG/ML     ??? tobramycin, PF, ([name]) 300 mg/5 mL nebulizer solution INHALE CONTENTS OF 1 AMP (300MG ) VIA NEBULIZER EVERY 12 HOURS FOR 28 DAYS ON, THEN 28 DAYS OFF. REFILL: [phone] 280 mL 6     No current facility-administered medications for this visit.        Allergies  Reviewed at 3:25 PM      Reactions Comment    Gentamicin Other (See Comments) BALANCE ISSUES    Tobramycin Other (See Comments) ototoxicity    Morphine Nausea And Vomiting         Social History     Socioeconomic History   ??? Marital status: Married     Spouse name: Gavin Pound   ??? Number of children: 2   ??? Years of education: None   ??? Highest education level: None   Occupational History     Employer: NOT EMPLOYED   Social Needs   ??? Financial resource strain: None   ??? Food insecurity:     Worry: None     Inability: None   ??? Transportation needs:     Medical: None     Non-medical: None   Tobacco Use   ??? Smoking status: Never Smoker   ??? Smokeless tobacco: Never Used   Substance and Sexual Activity   ??? Alcohol use: No     Alcohol/week: 0.0 standard drinks   ??? Drug use: No   ??? Sexual activity: None   Lifestyle   ??? Physical activity:     Days per week: None     Minutes per session: None   ??? Stress: None   Relationships   ??? Social connections:     Talks on phone: None     Gets together: None     Attends religious service: None     Active member of club or organization: None     Attends meetings of clubs or organizations: None     Relationship status: None   Other Topics Concern   ??? 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         Objective:   Objective   BP 128/71  - Pulse 94  - Temp 36.7 ??C (98.1 ??F)  - Ht 174 cm (5' 8.5)  - Wt 83.9 kg (  185 lb)  - SpO2 92%  - BMI 27.72 kg/m??   General Appearance:   Caucasian male appearing comfortable, non-toxic, in no distress, and stated age.   Eyes:  PERRL, conjunctiva clear, EOM's intact. No drainage.   Nose: Nares normal, septum midline. Congested nasal mucosa.  No drainage or polyps.  No sinus tenderness.   Oropharynx: Moist mucus membranes without lesions or thrush.  Good dentition.  Posterior pharynx clear.   Respiratory:   Decreased breath sounds over right mid to lower lobe consistent with prior resection. Crackles and bronchial breath sounds over left lower lobe. Easy work of breathing without accessory muscle use.     Cardiovascular:  Regular rate and rhythm. S1 and S2 normal. No murmur, rub  or gallop.  Pulses intact and symmetric.  No peripheral cyanosis or edema.   Musculoskeletal: No tenderness or deformity of the chest wall.  Joints normal.   Mild clubbing.   Skin: No rashes, lesions, skin changes.   Heme/Lymph: No cervical, supraclavicular, submandibular, or suprasternal adenopathy.  No bruising or petechiae.   Neurologic: Alert and oriented x3. No focal neurological deficits.  Normal gait.      Diagnostic Review:   Pulmonary Functions Testing Results:      Measures are consistent with severe airway obstruction. FVC reduced, likely reflects moderate restriction post thoracic surgery. Normal inspiratory limb. Measures are stable.        Chest CT (03/31/16): Images personally reviewed. Status post right middle and right lower lobectomies. Tiny loculated basilar right hydropneumothorax, probably chronic. Moderate cylindrical and varicoid bronchiectasis throughout both lungs, with associated scattered mucoid impaction, tree-in-bud opacities and scattered peribronchovascular and subpleural consolidation in the right upper lobe. These findings are largely new compared to the remote prior CT studies and most consistent with  significant progression of chronic infectious bronchiolitis due to atypical mycobacterial infection (MAI). Two-vessel coronary atherosclerosis. Nonobstructing right nephrolithiasis.    Cultures   Source Bacterial Culture AFB Smear AFB Culture   04/02/15 Sputum 2+ OPF; 1+ Probably Mucoid Pseudomonas Negative Negative   07/02/15 Sputum 4+ OPF; 3+ Mucoid Pseudomonas; 3+ Smooth Pseudomonas; 1+ Stenotrophomonas Negative Negative   01/23/16 Sputum 2+ OPF; 1+ Mucoid Pseudomonas; 1+ Smooth Pseudomonas     03/24/16 Sputum 3+ OPF; 3+ Mucoid Pseudomonas; 3+ Smooth Pseudomonas Negative Overgrown   07/21/16 Sputum 4+ OPF; 4+ Mucoid Pseudomonas; 2+ Smooth Pseudomonas Negative Negative   12/29/16 Sputum 4+ OPF; 3+ Mucoid Pseudomonas; 3+ Smooth Pseudomonas Negative Actinomadura sp   04/06/17 Sputum 3+ OPF; 4+ Mucoid Pseudomonas Negative Negative   07/06/17 Sputum 4+ OPF; 3+ Mucoid Pseudomonas Negative Negative   07/06/17 sensitivity testing of Pseudomonas isolate      Bronchiectasis Evaluation:  IgG with subclasses:   Lab Results   Component Value Date/Time    IGGT 996 04/28/2012 06:21 AM    IGG1 402 04/28/2012 06:21 AM    IGG2 399 04/28/2012 06:21 AM    IGG3 148.0 (H) 04/28/2012 06:21 AM    IGG4 47.7 04/28/2012 06:21 AM     IgA (09/21/11): 283   IgM (09/21/11): 52   IgE:   Lab Results   Component Value Date/Time    IGE 10.5 01/22/2016 09:53 PM     HIV: No results found for: HIVAGAB    CBC-D:   Lab Results   Component Value Date/Time    HGB 14.1 04/19/2017 05:45 AM    HGB 14.8 03/24/2015 05:31 PM    HCT 43.9 04/19/2017 05:45 AM    HCT 30.8 (L) 08/13/2013  03:57 AM    PLT 140 (L) 04/19/2017 05:45 AM    PLT 292 08/13/2013 03:57 AM    NEUTROABS 3.2 04/19/2017 05:45 AM    NEUTROABS 4.9 06/21/2013 09:31 PM    EOSABS 0.2 04/19/2017 05:45 AM    EOSABS 0.2 06/21/2013 09:31 PM     Alpha-1-Antitrypsin (11/11/2010): MM genotype.    CF testing: Sweat test   Lab Results   Component Value Date/Time    AMTLFT 195 06/17/2012 11:39 AM    AMTRT 254 06/17/2012 11:39 AM    SWCLL 6 06/17/2012 11:39 AM    SWCLR 7 06/17/2012 11:39 AM     Health Care Maintenance:  Most Recent Immunizations   Administered Date(s) Administered   ??? INFLUENZA TIV (TRI) 73MO+ W/ PRESERV (IM) 01/07/2012   ??? INFLUENZA TIV (TRI) PF (IM) 12/09/2012   ??? Influenza Vaccine Quad (IIV4 PF) 35mo+ injectable 01/31/2016   ??? Influenza Virus Vaccine, unspecified formulation 12/01/2016   ??? Influenza Whole 12/21/2008   ??? PNEUMOCOCCAL POLYSACCHARIDE 23 02/07/2016   ??? Pneumococcal Conjugate 13-Valent 04/28/2013   ??? TdaP 03/22/2012

## 2017-10-12 NOTE — Unmapped (Signed)
[facility] Refill Coordination Note  Specialty Medication(s): COLISTIN  Additional Medications shipped: STERILE WATER, NEEDLES, NEB CUPS, ALCOHOL PADS   Pt states doesn't use normal saline so doesn't need it sent out.    Jordan [name], DOB: 1956-01-11  Phone: [phone] (home) , Alternate phone contact: N/A  Phone or address changes today?: No  All above HIPAA information was verified with patient.  Shipping Address: [address]  [place] [address]   Insurance changes? No    Completed refill call assessment today to schedule patient's medication shipment from the [facility] [phone]).      Confirmed the medication and dosage are correct and have not changed: Yes, regimen is correct and unchanged.    Confirmed patient started or stopped the following medications in the past month:  No, there are no changes reported at this time.    Are you tolerating your medication?:  Jordan [name] reports tolerating the medication.    ADHERENCE    Did you miss any doses in the past 4 weeks? No missed doses reported.    FINANCIAL/SHIPPING    Delivery Scheduled: Yes, Expected medication delivery date: 10/15/17 via ups     The patient will receive an FSI print out for each medication shipped and additional FDA Medication Guides as required.  Patient education from [employer] or Robet Leu may also be included in the shipment    Jordan [name] did not have any additional questions at this time.    Delivery address validated in FSI scheduling system: Yes, address listed in FSI is correct.    We will follow up with patient monthly for standard refill processing and delivery.      Thank you,  Thad Ranger   [facility] Specialty Pharmacist

## 2017-10-13 ENCOUNTER — Encounter (HOSPITAL_COMMUNITY): Payer: Self-pay

## 2017-10-13 ENCOUNTER — Emergency Department (HOSPITAL_COMMUNITY): Payer: 59

## 2017-10-13 ENCOUNTER — Other Ambulatory Visit: Payer: Self-pay

## 2017-10-13 ENCOUNTER — Inpatient Hospital Stay (HOSPITAL_COMMUNITY)
Admission: EM | Admit: 2017-10-13 | Discharge: 2017-10-16 | DRG: 872 | Disposition: A | Discharge status: Home / Self Care | Payer: 59 | Attending: Internal Medicine | Admitting: Internal Medicine

## 2017-10-13 DIAGNOSIS — C61 Malignant neoplasm of prostate: Secondary | ICD-10-CM | POA: Diagnosis present

## 2017-10-13 DIAGNOSIS — Z8249 Family history of ischemic heart disease and other diseases of the circulatory system: Secondary | ICD-10-CM

## 2017-10-13 DIAGNOSIS — D7589 Other specified diseases of blood and blood-forming organs: Secondary | ICD-10-CM | POA: Diagnosis not present

## 2017-10-13 DIAGNOSIS — Z79899 Other long term (current) drug therapy: Secondary | ICD-10-CM

## 2017-10-13 DIAGNOSIS — K611 Rectal abscess: Secondary | ICD-10-CM | POA: Diagnosis present

## 2017-10-13 DIAGNOSIS — A419 Sepsis, unspecified organism: Secondary | ICD-10-CM | POA: Diagnosis not present

## 2017-10-13 DIAGNOSIS — Z7951 Long term (current) use of inhaled steroids: Secondary | ICD-10-CM

## 2017-10-13 DIAGNOSIS — K219 Gastro-esophageal reflux disease without esophagitis: Secondary | ICD-10-CM | POA: Diagnosis present

## 2017-10-13 DIAGNOSIS — I10 Essential (primary) hypertension: Secondary | ICD-10-CM | POA: Diagnosis present

## 2017-10-13 DIAGNOSIS — Z881 Allergy status to other antibiotic agents status: Secondary | ICD-10-CM

## 2017-10-13 DIAGNOSIS — Z902 Acquired absence of lung [part of]: Secondary | ICD-10-CM

## 2017-10-13 DIAGNOSIS — J479 Bronchiectasis, uncomplicated: Secondary | ICD-10-CM | POA: Diagnosis present

## 2017-10-13 DIAGNOSIS — R000 Tachycardia, unspecified: Secondary | ICD-10-CM | POA: Diagnosis not present

## 2017-10-13 DIAGNOSIS — Z8701 Personal history of pneumonia (recurrent): Secondary | ICD-10-CM

## 2017-10-13 DIAGNOSIS — Z825 Family history of asthma and other chronic lower respiratory diseases: Secondary | ICD-10-CM

## 2017-10-13 DIAGNOSIS — R509 Fever, unspecified: Secondary | ICD-10-CM | POA: Diagnosis not present

## 2017-10-13 DIAGNOSIS — R0902 Hypoxemia: Secondary | ICD-10-CM | POA: Diagnosis present

## 2017-10-13 DIAGNOSIS — J984 Other disorders of lung: Secondary | ICD-10-CM | POA: Diagnosis not present

## 2017-10-13 DIAGNOSIS — Z79891 Long term (current) use of opiate analgesic: Secondary | ICD-10-CM

## 2017-10-13 DIAGNOSIS — G4733 Obstructive sleep apnea (adult) (pediatric): Secondary | ICD-10-CM | POA: Diagnosis present

## 2017-10-13 DIAGNOSIS — Z885 Allergy status to narcotic agent status: Secondary | ICD-10-CM

## 2017-10-13 DIAGNOSIS — K610 Anal abscess: Secondary | ICD-10-CM | POA: Diagnosis not present

## 2017-10-13 LAB — COMPREHENSIVE METABOLIC PANEL
ALT: 36 U/L (ref 0–44)
AST: 25 U/L (ref 15–41)
Albumin: 3.7 g/dL (ref 3.5–5.0)
Alkaline Phosphatase: 135 U/L — ABNORMAL HIGH (ref 38–126)
Anion gap: 14 (ref 5–15)
BUN: 29 mg/dL — ABNORMAL HIGH (ref 8–23)
CO2: 18 mmol/L — ABNORMAL LOW (ref 22–32)
Calcium: 9.4 mg/dL (ref 8.9–10.3)
Chloride: 105 mmol/L (ref 98–111)
Creatinine, Ser: 1.09 mg/dL (ref 0.61–1.24)
GFR calc Af Amer: >60 mL/min (ref >60)
GFR calc non Af Amer: >60 mL/min (ref >60)
Glucose, Bld: 100 mg/dL — ABNORMAL HIGH (ref 70–99)
Potassium: 4 mmol/L (ref 3.5–5.1)
Sodium: 137 mmol/L (ref 135–145)
Total Bilirubin: 1 mg/dL (ref 0.3–1.2)
Total Protein: 8 g/dL (ref 6.5–8.1)

## 2017-10-13 LAB — CBC WITH DIFFERENTIAL/PLATELET
Abs Immature Granulocytes: 0.1 10*3/uL (ref 0.0–0.1)
Basophils Absolute: 0 10*3/uL (ref 0.0–0.1)
Basophils Relative: 0 %
Eosinophils Absolute: 0.1 10*3/uL (ref 0.0–0.7)
Eosinophils Relative: 1 %
HCT: 51.6 % (ref 39.0–52.0)
Hemoglobin: 17.2 g/dL — ABNORMAL HIGH (ref 13.0–17.0)
Immature Granulocytes: 1 %
Lymphocytes Relative: 2 %
Lymphs Abs: 0.4 10*3/uL — ABNORMAL LOW (ref 0.7–4.0)
MCH: 30 pg (ref 26.0–34.0)
MCHC: 33.3 g/dL (ref 30.0–36.0)
MCV: 89.9 fL (ref 78.0–100.0)
Monocytes Absolute: 0.6 10*3/uL (ref 0.1–1.0)
Monocytes Relative: 3 %
Neutro Abs: 18.1 10*3/uL — ABNORMAL HIGH (ref 1.7–7.7)
Neutrophils Relative %: 93 %
Platelets: 292 10*3/uL (ref 150–400)
RBC: 5.74 MIL/uL (ref 4.22–5.81)
RDW: 12.4 % (ref 11.5–15.5)
WBC: 19.4 10*3/uL — ABNORMAL HIGH (ref 4.0–10.5)

## 2017-10-13 LAB — URINALYSIS, ROUTINE W REFLEX MICROSCOPIC
Bilirubin Urine: NEGATIVE
Glucose, UA: NEGATIVE mg/dL
Hgb urine dipstick: NEGATIVE
Ketones, ur: NEGATIVE mg/dL
Leukocytes, UA: NEGATIVE
Nitrite: NEGATIVE
Protein, ur: NEGATIVE mg/dL
Specific Gravity, Urine: 1.024 (ref 1.005–1.030)
pH: 5 (ref 5.0–8.0)

## 2017-10-13 LAB — PROTIME-INR
INR: 1
Prothrombin Time: 13.1 seconds (ref 11.4–15.2)

## 2017-10-13 LAB — I-STAT CG4 LACTIC ACID, ED: Lactic Acid, Venous: 1.12 mmol/L (ref 0.5–1.9)

## 2017-10-13 MED ORDER — VANCOMYCIN HCL 10 G IV SOLR
1750.0000 mg | Freq: Once | INTRAVENOUS | Status: AC
  Administered 2017-10-13: 1750 mg via INTRAVENOUS
  Filled 2017-10-13: qty 1750

## 2017-10-13 MED ORDER — KETOROLAC TROMETHAMINE 15 MG/ML IJ SOLN
15.0000 mg | Freq: Once | INTRAMUSCULAR | Status: AC
  Administered 2017-10-13: 15 mg via INTRAVENOUS
  Filled 2017-10-13: qty 1

## 2017-10-13 MED ORDER — LACTATED RINGERS IV BOLUS
1000.0000 mL | Freq: Once | INTRAVENOUS | Status: AC
  Administered 2017-10-13: 1000 mL via INTRAVENOUS

## 2017-10-13 MED ORDER — PIPERACILLIN-TAZOBACTAM 3.375 G IVPB 30 MIN
3.3750 g | Freq: Once | INTRAVENOUS | Status: AC
  Administered 2017-10-13: 3.375 g via INTRAVENOUS
  Filled 2017-10-13: qty 50

## 2017-10-13 MED ORDER — IOHEXOL 300 MG/ML  SOLN
100.0000 mL | Freq: Once | INTRAMUSCULAR | Status: AC | PRN
  Administered 2017-10-13: 100 mL via INTRAVENOUS

## 2017-10-13 MED ORDER — HYDROMORPHONE HCL 1 MG/ML IJ SOLN
0.5000 mg | Freq: Once | INTRAMUSCULAR | Status: AC
  Administered 2017-10-13: 0.5 mg via INTRAVENOUS
  Filled 2017-10-13: qty 1

## 2017-10-13 MED FILL — PARI LC PLUS NEBULIZER SET//MISC: PARI LC PLUS NEBULIZER SET//MISC | 30 days supply | Qty: 1 | Fill #4

## 2017-10-13 MED FILL — BD 3ML LUER-LOK SYRINGE 2/20GX1.5/MISC: BD 3ML LUER-LOK SYRINGE 2/20GX1.5/MISC | 30 days supply | Qty: 60 | Fill #4

## 2017-10-13 MED FILL — COLISTIMETHATE SODIUM/150MG/SOLR: COLISTIMETHATE SODIUM/150MG/SOLR | 30 days supply | Qty: 60 | Fill #5

## 2017-10-13 MED FILL — STERILE WATER INJ//10ML: STERILE WATER INJ//10ML | 30 days supply | Qty: 60 | Fill #4

## 2017-10-13 NOTE — ED Notes (Signed)
Pt. Going to x ray, x ray transporting pt. To B19

## 2017-10-13 NOTE — ED Provider Notes (Signed)
[facility] EMERGENCY DEPARTMENT Provider Note   CSN: [id number] Arrival date & time: 10/13/17  1945     History   Chief Complaint Chief Complaint  Patient presents with  . Abscess  . Code Sepsis    HPI [name] is a 62 y.o. male.  HPI   62 year old male with fever, shaking chills and rectal pain.  He had a perirectal abscess incised and drained in the office earlier today.  On his way home from this visit he began developing shaking chills and nausea.  Felt very weak.  Arrival to the emergency room he was tachycardic with rate of 140.  Tachypneic and oxygen saturations high 80s on room air.  He does endorse feeling mildly short of breath.  He does have a history of  Past Medical History:  Diagnosis Date  . Bronchiectasis ([place])   . Bronchiolitis   . Dizziness and giddiness 07/26/2012  . DJD (degenerative joint disease)   . Gastroesophageal reflux disease   . HTN (hypertension)   . OSA (obstructive sleep apnea)   . Pneumonia   . Pneumonia with lung abscess ([place])   . Prostate cancer ([place])   . Tear of left rotator cuff     Patient Active Problem List   Diagnosis Date Noted  . Malignant neoplasm of prostate ([place]) 09/09/2017  . Right-sided chest wall pain 06/02/2013  . Hypersomnia with sleep apnea, unspecified 02/20/2013  . Pneumonia due to Pseudomonas ([place]) 02/20/2013  . Dizziness and giddiness 07/26/2012  . Memory deficit 07/26/2012  . Pneumonia with lung abscess ([place])   . DJD (degenerative joint disease)   . HTN (hypertension)   . Lung abscess ([place]) 11/06/2010  . Bronchiectasis ([place]) 11/06/2010    Past Surgical History:  Procedure Laterality Date  . APPENDECTOMY  1969  . BACK SURGERY     multiple, DJD, cervical fusion, plate removed  . CARPAL TUNNEL RELEASE Right 1999  . COLONOSCOPY  12/1980   bleeding from penis and anus after beach trip; colonoscopy and removed urinary stricture at same time  . KNEE ARTHROSCOPY     left  . LUNG  SURGERY  2012,    removed right middle lobe,due to birth defect, removed half of right bottom lobe due to abcess  . NECK SURGERY  3818,2993, 2002  . OTHER SURGICAL HISTORY     removed 4" of lower right rib  . OTHER SURGICAL HISTORY     has had 8 PICC lines placed over the course of several years.  . Right video-assisted thoracoscopic surgery,  12/05/2010  . ROTATOR CUFF REPAIR     no surgery, because of lung infection  . TONSILLECTOMY  1965  . VIDEO BRONCHOSCOPY     Dr Arlyce Dice        Home Medications    Prior to Admission medications   Medication Sig Start Date End Date Taking? Authorizing Provider  albuterol (PROVENTIL HFA;VENTOLIN HFA) 108 (90 Base) MCG/ACT inhaler Inhale into the lungs every 6 (six) hours as needed for wheezing or shortness of breath.    [provider]  albuterol (PROVENTIL) (2.5 MG/3ML) 0.083% nebulizer solution Inhale into the lungs.    [provider]  azithromycin (ZITHROMAX) 250 MG tablet Take by mouth. 08/05/17   [provider]  budesonide-formoterol (SYMBICORT) 160-4.5 MCG/ACT inhaler Inhale 2 puffs into the lungs 2 (two) times daily.    [provider]  colistimethate (COLYMYCIN) 150 MG injection Inhale into the lungs. 12/30/15   [provider]  dornase alpha (PULMOZYME) 1 MG/ML nebulizer solution Inhale into the lungs. 07/06/17 07/06/18  [provider]  Ferrous Sulfate (IRON) 325 (65 Fe) MG TABS Take by mouth.    [provider]  HYDROcodone-acetaminophen (NORCO) 7.5-325 MG tablet Take by mouth.    [provider]  HYDROcodone-homatropine (HYCODAN) 5-1.5 MG/5ML syrup Take 5 mLs by mouth every 6 (six) hours as needed for cough. 04/06/12   Tanda Rockers, MD  NON FORMULARY Use Acapella twice daily for airway clearance. Dx. 494.0. Mucous clearing device. 11/18/12   [provider]  NON FORMULARY Provide 1 device  for use with inhaled medication. Pari LC D Nebulizer 12/17/16    [provider]  NON FORMULARY syringe with needle 3 mL 20 gauge x 1" Syrg 12/30/15   [provider]  omeprazole (PRILOSEC) 20 MG capsule Take 20 mg by mouth.    [provider]  Respiratory Therapy Supplies (FLUTTER) DEVI Use several times daily as needed for congestion/thick mucus    [provider]  Sodium Chloride, Inhalant, 7 % NEBU Inhale into the lungs.    [provider]  tadalafil (CIALIS) 20 MG tablet  01/10/16   [provider]  tobramycin, PF, ([name]) 300 MG/5ML nebulizer solution Take 300 mg by nebulization 2 (two) times daily.    [provider]  Flossie Buffy ALCOHOL SWABS PADS Use as directed with inhaled antibiotics 12/30/15   [provider]  vitamin C (ASCORBIC ACID) 250 MG tablet Take 250 mg by mouth daily.    [provider]  Water For Irrigation, Sterile (STERILE WATER FOR IRRIGATION) Irrigate with as directed once.    [provider]  Wheat Dextrin (BENEFIBER PO) Take by mouth.    [provider]    Family History Family History  Problem Relation Age of Onset  . Stroke Mother   . Alzheimer's disease Mother   . Transient ischemic attack Mother   . Chorea Mother   . Liver disease Father   . Diabetes Father   . Congestive Heart Failure Father   . Other Other        sibling with possible right middle lobe issues  . Ataxia Brother   . Ataxia Maternal Aunt   . Prostate cancer Maternal Grandfather   . Prostate cancer Cousin   . Prostate cancer Cousin   . Prostate cancer Cousin     Social History Social History   Tobacco Use  . Smoking status: Never Smoker  . Smokeless tobacco: Never Used  Substance Use Topics  . Alcohol use: No    Comment: quit in 1994  . Drug use: No     Allergies   Gentamicin; Morphine and related; and Tobramycin   Review of Systems Review of Systems  All systems reviewed and negative, other than as noted in HPI.  Physical Exam Updated  Vital Signs BP 130/82   Pulse (!) 140   Temp (!) 101.1 F (38.4 C) (Oral)   Resp (!) 22   SpO2 (!) 89%   Physical Exam  Constitutional: He appears well-developed and well-nourished. No distress.  HENT:  Head: Normocephalic and atraumatic.  Eyes: Conjunctivae are normal. Right eye exhibits no discharge. Left eye exhibits no discharge.  Neck: Neck supple.  Cardiovascular: Normal rate, regular rhythm and normal heart sounds. Exam reveals no gallop and no friction rub.  No murmur heard. Pulmonary/Chest: Effort normal. No respiratory distress.  Decreased breath sounds R base  Abdominal: Soft. He exhibits no  distension. There is no tenderness.  Genitourinary:  Genitourinary Comments: Indurated area with packing in place consistent with recent I&D R perirectal region. No significant cellulitis.    Musculoskeletal: He exhibits no edema or tenderness.  Neurological: He is alert.  Skin: Skin is warm and dry.  Psychiatric: He has a normal mood and affect. His behavior is normal. Thought content normal.  Nursing note and vitals reviewed.    ED Treatments / Results  Labs (all labs ordered are listed, but only abnormal results are displayed) Labs Reviewed  COMPREHENSIVE METABOLIC PANEL - Abnormal; Notable for the following components:      Result Value   CO2 18 (*)    Glucose, Bld 100 (*)    BUN 29 (*)    Alkaline Phosphatase 135 (*)    All other components within normal limits  CBC WITH DIFFERENTIAL/PLATELET - Abnormal; Notable for the following components:   WBC 19.4 (*)    Hemoglobin 17.2 (*)    Neutro Abs 18.1 (*)    Lymphs Abs 0.4 (*)    All other components within normal limits  CULTURE, BLOOD (ROUTINE X 2)  CULTURE, BLOOD (ROUTINE X 2)  PROTIME-INR  URINALYSIS, ROUTINE W REFLEX MICROSCOPIC  I-STAT CG4 LACTIC ACID, ED  I-STAT CG4 LACTIC ACID, ED    EKG None  Radiology Dg Chest 2 View  Result Date: 10/13/2017 CLINICAL DATA:  Fever, dyspnea on exertion. EXAM: CHEST -  2 VIEW COMPARISON:  CT scan of March 31, 2016. Radiographs of March 24, 2015. FINDINGS: Stable cardiomediastinal silhouette. No pneumothorax is noted. Stable right apical scarring and postsurgical changes are noted. Stable probable loculated right pleural effusion is noted with associated right basilar scarring. Left lung is hyperexpanded with mediastinal shift to the right consistent with volume loss. This is unchanged. Bony thorax is unremarkable. IMPRESSION: Stable scarring and postsurgical changes are seen in the right hemithorax. Stable mild probable loculated right pleural effusion is noted. No acute abnormality is noted. Electronically Signed   By: Marijo Conception, M.D.   On: 10/13/2017 21:32   Ct Abdomen Pelvis W Contrast  Result Date: 10/13/2017 CLINICAL DATA:  Sepsis recent diagnosis of prostate cancer, I and D of right perirectal abscess EXAM: CT ABDOMEN AND PELVIS WITH CONTRAST TECHNIQUE: Multidetector CT imaging of the abdomen and pelvis was performed using the standard protocol following bolus administration of intravenous contrast. CONTRAST:  142mL OMNIPAQUE IOHEXOL 300 MG/ML  SOLN COMPARISON:  CT 04/20/2013 FINDINGS: Lower chest: Lung bases demonstrate volume loss on the right. Bronchiectasis in the right lower lobe with postsurgical changes at the right base. There is pleural and parenchymal scarring and pleural calcification. Mild bronchiectasis in the lingula and left lung base. Scattered tiny nodules throughout the lingula and left lung base. Hepatobiliary: No focal liver abnormality is seen. No gallstones, gallbladder wall thickening, or biliary dilatation. Pancreas: Unremarkable. No pancreatic ductal dilatation or surrounding inflammatory changes. Spleen: Normal in size without focal abnormality. Adrenals/Urinary Tract: Punctate stone upper pole right kidney. Adrenal glands are normal. No hydronephrosis. The bladder is normal Stomach/Bowel: Stomach is within normal limits. Appendix not  well seen, consistent with history of appendectomy. No evidence of bowel wall thickening, distention, or inflammatory changes. Vascular/Lymphatic: Nonaneurysmal aorta. No significantly enlarged lymph nodes. Minimal aortic atherosclerosis. Reproductive: No significant enlargement. Small focal hypodensity at the right base of the prostate. Other: Thick-walled air in fluid collection in the right posterior perianal region measuring 2.1 x 1.5 by 1.8 cm consistent with abscess. Moderate surrounding edema.  Musculoskeletal: No acute osseous abnormality. Degenerative changes of the spine. Slight interval increase in size of a focal sclerotic lesion in the left posterior iliac bone since 2015. it measures 9 mm in size, compared with 4 mm previously. IMPRESSION: 1. 2.1 x 1.5 x 1.8 cm thick-walled air in fluid collection in the right posterior perianal region consistent with an abscess. 2. Small focal hypodensity at the base of the right prostate possibly due to cyst or post biopsy change. 3. Postsurgical changes at the right lung base. Bronchiectasis and diffuse nodularity within the lingula and left base, consistent with respiratory infection, possible atypical pneumonia. 4. Slow growth of sclerotic lesion within the left posterior iliac bone since 2015. Electronically Signed   By: Donavan Foil M.D.   On: 10/13/2017 23:21    Procedures Procedures (including critical care time)  Medications Ordered in ED Medications  lactated ringers bolus 1,000 mL (has no administration in time range)  piperacillin-tazobactam (ZOSYN) IVPB 3.375 g (has no administration in time range)  vancomycin (VANCOCIN) 1,750 mg in sodium chloride 0.9 % 500 mL IVPB (has no administration in time range)     Initial Impression / Assessment and Plan / ED Course  I have reviewed the triage vital signs and the nursing notes.  Pertinent labs & imaging results that were available during my care of the patient were reviewed by me and considered  in my medical decision making (see chart for details).     62yM with sepsis. Likely from perirectal abscess. Small on CT. It was just I&D'd late this afternoon. At this time, I'm not sure if there is much utility in trying to open this area up more versus continuing to observe on antibiotics.  Given his generalized weakness and Reiger's, will admit for IV antibiotics.    The patient has been advised of the diagnosis and plan. I reviewed any labs and imaging including any potential incidental findings.  Final Clinical Impressions(s) / ED Diagnoses   Final diagnoses:  Sepsis, due to unspecified organism [facility])  Perirectal abscess    ED Discharge Orders    None       Virgel Manifold, MD 10/17/17 2024

## 2017-10-13 NOTE — ED Triage Notes (Signed)
Pt reports that this afternoon he had a perirectal abscess drained at [facility] in office. Pt states that on the way home he was shivering and not feeling good, pt febrile in triage, HR 140, 02 88 on RA

## 2017-10-13 NOTE — Unmapped (Signed)
Bronchiectasis Clinic Assessment    Inhaled Medications:      Albuterol Neb   Albuterol HFA    Colistin  Hypertonic Saline 7%  Pulmozyme  Symbicort HFA  [name] INH      Home O2: NA    Review Ordered Medications that are Taken, Frequency, Compliance: Mr. Nigh did not have any questions at this time.   We discussed the pari trek s and the innospire go portable nebulizers.  We determined that due to his usage needs and medications that the Pari Trek S would be beneficial to him.  He was going to compare prices and let me know if he needs a prescription.      Barriers to Compliance and Solutions: NA    Airway Clearance Used:   Waymon Budge.  Patient states no issues or questions at this time.     Cough Techniques:  NA    Spacer/MDI Training: NA    Bipap/Cpap;Settings if Use: NA    Equipment Review/Cleaning: Equipment cleaning reviewed.   All questions answered.     DME Provider:   No needs noted at this time.    Misc. Notes: Mr. Plaskett tried on the Respirtech vest and the Afflo vest, he was shown the Monarch vest and the Smart vest.  Mr. Heffern was given product information and he will contact me if he would like to trial a vest or order one.

## 2017-10-14 ENCOUNTER — Encounter (HOSPITAL_COMMUNITY): Payer: Self-pay | Admitting: Internal Medicine

## 2017-10-14 ENCOUNTER — Other Ambulatory Visit: Payer: Self-pay

## 2017-10-14 DIAGNOSIS — I10 Essential (primary) hypertension: Secondary | ICD-10-CM | POA: Diagnosis present

## 2017-10-14 DIAGNOSIS — Z885 Allergy status to narcotic agent status: Secondary | ICD-10-CM | POA: Diagnosis not present

## 2017-10-14 DIAGNOSIS — R509 Fever, unspecified: Secondary | ICD-10-CM | POA: Diagnosis present

## 2017-10-14 DIAGNOSIS — K219 Gastro-esophageal reflux disease without esophagitis: Secondary | ICD-10-CM | POA: Diagnosis present

## 2017-10-14 DIAGNOSIS — K611 Rectal abscess: Secondary | ICD-10-CM | POA: Diagnosis not present

## 2017-10-14 DIAGNOSIS — C61 Malignant neoplasm of prostate: Secondary | ICD-10-CM | POA: Diagnosis not present

## 2017-10-14 DIAGNOSIS — A419 Sepsis, unspecified organism: Secondary | ICD-10-CM

## 2017-10-14 DIAGNOSIS — G4733 Obstructive sleep apnea (adult) (pediatric): Secondary | ICD-10-CM | POA: Diagnosis present

## 2017-10-14 DIAGNOSIS — Z902 Acquired absence of lung [part of]: Secondary | ICD-10-CM | POA: Diagnosis not present

## 2017-10-14 DIAGNOSIS — R0902 Hypoxemia: Secondary | ICD-10-CM | POA: Diagnosis present

## 2017-10-14 DIAGNOSIS — Z8249 Family history of ischemic heart disease and other diseases of the circulatory system: Secondary | ICD-10-CM | POA: Diagnosis not present

## 2017-10-14 DIAGNOSIS — Z8701 Personal history of pneumonia (recurrent): Secondary | ICD-10-CM | POA: Diagnosis not present

## 2017-10-14 DIAGNOSIS — Z825 Family history of asthma and other chronic lower respiratory diseases: Secondary | ICD-10-CM | POA: Diagnosis not present

## 2017-10-14 DIAGNOSIS — J479 Bronchiectasis, uncomplicated: Secondary | ICD-10-CM

## 2017-10-14 DIAGNOSIS — Z7951 Long term (current) use of inhaled steroids: Secondary | ICD-10-CM | POA: Diagnosis not present

## 2017-10-14 DIAGNOSIS — Z79891 Long term (current) use of opiate analgesic: Secondary | ICD-10-CM | POA: Diagnosis not present

## 2017-10-14 DIAGNOSIS — Z881 Allergy status to other antibiotic agents status: Secondary | ICD-10-CM | POA: Diagnosis not present

## 2017-10-14 DIAGNOSIS — Z79899 Other long term (current) drug therapy: Secondary | ICD-10-CM | POA: Diagnosis not present

## 2017-10-14 LAB — CBC
HCT: 45 % (ref 39.0–52.0)
HCT: 42.7 % (ref 39.0–52.0)
Hemoglobin: 14.9 g/dL (ref 13.0–17.0)
Hemoglobin: 13.9 g/dL (ref 13.0–17.0)
MCH: 30 pg (ref 26.0–34.0)
MCH: 29.9 pg (ref 26.0–34.0)
MCHC: 33.1 g/dL (ref 30.0–36.0)
MCHC: 32.6 g/dL (ref 30.0–36.0)
MCV: 90.5 fL (ref 78.0–100.0)
MCV: 91.8 fL (ref 78.0–100.0)
Platelets: 237 10*3/uL (ref 150–400)
Platelets: 181 10*3/uL (ref 150–400)
RBC: 4.97 MIL/uL (ref 4.22–5.81)
RBC: 4.65 MIL/uL (ref 4.22–5.81)
RDW: 12.4 % (ref 11.5–15.5)
RDW: 12.4 % (ref 11.5–15.5)
WBC: 15.5 10*3/uL — ABNORMAL HIGH (ref 4.0–10.5)
WBC: 9.1 10*3/uL (ref 4.0–10.5)

## 2017-10-14 LAB — CREATININE, SERUM
Creatinine, Ser: 1.2 mg/dL (ref 0.61–1.24)
GFR calc Af Amer: >60 mL/min (ref >60)
GFR calc non Af Amer: >60 mL/min (ref >60)

## 2017-10-14 LAB — HIV ANTIBODY (ROUTINE TESTING W REFLEX): HIV Screen 4th Generation wRfx: NONREACTIVE

## 2017-10-14 MED ORDER — ALCOHOL SWABS
5 refills | 0.00000 days | Status: CP
Start: 2017-10-14 — End: 2017-12-05

## 2017-10-14 MED ORDER — ALCOHOL SWABS: each | 5 refills | 0 days

## 2017-10-14 MED ORDER — PANTOPRAZOLE SODIUM 40 MG PO TBEC
40.0000 mg | DELAYED_RELEASE_TABLET | Freq: Every day | ORAL | Status: DC
  Administered 2017-10-14 – 2017-10-16 (×3): 40 mg via ORAL
  Filled 2017-10-14 (×3): qty 1

## 2017-10-14 MED ORDER — SODIUM CHLORIDE 0.9 % IV BOLUS
2000.0000 mL | Freq: Once | INTRAVENOUS | Status: AC
  Administered 2017-10-14: 2000 mL via INTRAVENOUS

## 2017-10-14 MED ORDER — HYDROCODONE-HOMATROPINE 5-1.5 MG/5ML PO SYRP: 5.0000 mL | ORAL_SOLUTION | Freq: Four times a day (QID) | ORAL | Status: DC | PRN

## 2017-10-14 MED ORDER — PIPERACILLIN-TAZOBACTAM 3.375 G IVPB
3.3750 g | Freq: Three times a day (TID) | INTRAVENOUS | Status: DC
  Administered 2017-10-14 – 2017-10-16 (×7): 3.375 g via INTRAVENOUS
  Filled 2017-10-14 (×9): qty 50

## 2017-10-14 MED ORDER — TOBRAMYCIN 300 MG/5ML IN NEBU
300.0000 mg | INHALATION_SOLUTION | Freq: Two times a day (BID) | RESPIRATORY_TRACT | Status: DC
  Administered 2017-10-14 – 2017-10-16 (×5): 300 mg via RESPIRATORY_TRACT
  Filled 2017-10-14 (×7): qty 5

## 2017-10-14 MED ORDER — SODIUM CHLORIDE 7 % IN NEBU
1.0000 | INHALATION_SOLUTION | Freq: Four times a day (QID) | RESPIRATORY_TRACT | Status: DC
  Administered 2017-10-14 – 2017-10-16 (×8): 1 via RESPIRATORY_TRACT

## 2017-10-14 MED ORDER — KETOROLAC TROMETHAMINE 30 MG/ML IJ SOLN
30.0000 mg | Freq: Four times a day (QID) | INTRAMUSCULAR | Status: DC | PRN
  Administered 2017-10-14 – 2017-10-15 (×4): 30 mg via INTRAVENOUS
  Filled 2017-10-14 (×4): qty 1

## 2017-10-14 MED ORDER — DOCUSATE SODIUM 100 MG PO CAPS
200.0000 mg | ORAL_CAPSULE | Freq: Every day | ORAL | Status: DC
Start: 2017-10-14 — End: 2017-10-16
  Administered 2017-10-14 – 2017-10-15 (×3): 200 mg via ORAL
  Filled 2017-10-14 (×3): qty 2

## 2017-10-14 MED ORDER — PIPERACILLIN-TAZOBACTAM 3.375 G IVPB 30 MIN: 3.3750 g | Freq: Three times a day (TID) | INTRAVENOUS | Status: DC

## 2017-10-14 MED ORDER — DORNASE ALFA 2.5 MG/2.5ML IN SOLN
2.5000 mg | Freq: Every day | RESPIRATORY_TRACT | Status: DC
  Administered 2017-10-14 – 2017-10-16 (×3): 2.5 mg via RESPIRATORY_TRACT
  Filled 2017-10-14 (×3): qty 2.5

## 2017-10-14 MED ORDER — VITAMIN C 500 MG PO TABS
1000.0000 mg | ORAL_TABLET | Freq: Two times a day (BID) | ORAL | Status: DC
  Administered 2017-10-14 – 2017-10-16 (×6): 1000 mg via ORAL
  Filled 2017-10-14 (×6): qty 2

## 2017-10-14 MED ORDER — ACETAMINOPHEN 650 MG RE SUPP: 650.0000 mg | Freq: Four times a day (QID) | RECTAL | Status: DC | PRN

## 2017-10-14 MED ORDER — ZOLPIDEM TARTRATE 5 MG PO TABS
5.0000 mg | ORAL_TABLET | Freq: Every evening | ORAL | Status: DC | PRN
  Administered 2017-10-14 – 2017-10-15 (×3): 5 mg via ORAL
  Filled 2017-10-14 (×3): qty 1

## 2017-10-14 MED ORDER — ONDANSETRON HCL 4 MG PO TABS: 4.0000 mg | ORAL_TABLET | Freq: Four times a day (QID) | ORAL | Status: DC | PRN

## 2017-10-14 MED ORDER — ONDANSETRON HCL 4 MG/2ML IJ SOLN: 4.0000 mg | Freq: Four times a day (QID) | INTRAMUSCULAR | Status: DC | PRN

## 2017-10-14 MED ORDER — MOMETASONE FURO-FORMOTEROL FUM 200-5 MCG/ACT IN AERO
2.0000 | INHALATION_SPRAY | Freq: Two times a day (BID) | RESPIRATORY_TRACT | Status: DC
  Administered 2017-10-14 – 2017-10-16 (×5): 2 via RESPIRATORY_TRACT
  Filled 2017-10-14: qty 8.8

## 2017-10-14 MED ORDER — HYDROCODONE-ACETAMINOPHEN 7.5-325 MG PO TABS
1.0000 | ORAL_TABLET | Freq: Three times a day (TID) | ORAL | Status: DC
  Administered 2017-10-14 – 2017-10-16 (×7): 1 via ORAL
  Filled 2017-10-14 (×7): qty 1

## 2017-10-14 MED ORDER — POLYETHYLENE GLYCOL 3350 17 G PO PACK
17.0000 g | PACK | Freq: Every day | ORAL | Status: DC | PRN
  Filled 2017-10-14: qty 1

## 2017-10-14 MED ORDER — ACETAMINOPHEN 325 MG PO TABS: 650.0000 mg | ORAL_TABLET | Freq: Four times a day (QID) | ORAL | Status: DC | PRN

## 2017-10-14 MED ORDER — FERROUS SULFATE 325 (65 FE) MG PO TABS
325.0000 mg | ORAL_TABLET | Freq: Two times a day (BID) | ORAL | Status: DC
  Administered 2017-10-14 – 2017-10-16 (×5): 325 mg via ORAL
  Filled 2017-10-14 (×5): qty 1

## 2017-10-14 MED ORDER — ALBUTEROL SULFATE (2.5 MG/3ML) 0.083% IN NEBU
2.5000 mg | INHALATION_SOLUTION | Freq: Four times a day (QID) | RESPIRATORY_TRACT | Status: DC
  Administered 2017-10-14 – 2017-10-16 (×9): 2.5 mg via RESPIRATORY_TRACT
  Filled 2017-10-14 (×9): qty 3

## 2017-10-14 MED ORDER — COLISTIMETHATE SODIUM POWD: 2.0000 mg | Freq: Every day | Status: DC

## 2017-10-14 MED ORDER — ENOXAPARIN SODIUM 40 MG/0.4ML ~~LOC~~ SOLN
40.0000 mg | SUBCUTANEOUS | Status: DC
  Administered 2017-10-14 – 2017-10-15 (×2): 40 mg via SUBCUTANEOUS
  Filled 2017-10-14 (×2): qty 0.4

## 2017-10-14 MED ORDER — CALCIUM POLYCARBOPHIL 625 MG PO TABS
ORAL_TABLET | Freq: Every day | ORAL | Status: DC
  Administered 2017-10-14 – 2017-10-16 (×3): via ORAL
  Filled 2017-10-14 (×3): qty 1

## 2017-10-14 MED FILL — ALCOHOL PADS//: ALCOHOL PADS// | 30 days supply | Qty: 1 | Fill #0

## 2017-10-14 NOTE — Progress Notes (Signed)
Nutrition Brief Note  Patient identified on the Malnutrition Screening Tool (MST) Report  Patient with PMH bronchiectasis, prostate cancer who he underwent I&D of a perirectal abscess at an outpatient office yesterday. Shortly after he got home he began to have nausea and rigors. Presented with those symptoms and bronchiecstasis   Spoke with patient, wife, and daughter at bedside. He reports intentional weight loss starting back in April. He normally has an atkins shake for breakfast, a soup or a salad for lunch, and a "good cooked meal," for dinner. Patient states he is trying to lose weight prior to prostate cancer surgery in September. He does report he was sick the week of July 8th and that he lost 8 pounds in a week then, but otherwise he is ok. NFPE WNL. Ate 100% for breakfast. He declined all interventions at this time.   Wt Readings from Last 15 Encounters:  10/14/17 184 lb 4.9 oz (83.6 kg)  09/06/17 199 lb (90.3 kg)  06/02/13 210 lb 3.2 oz (95.3 kg)  05/17/13 205 lb (93 kg)  03/15/13 203 lb (92.1 kg)  02/20/13 203 lb (92.1 kg)  07/26/12 199 lb (90.3 kg)  04/06/12 197 lb (89.4 kg)  01/21/12 190 lb 3.2 oz (86.3 kg)  01/07/12 189 lb (85.7 kg)  09/21/11 202 lb (91.6 kg)  09/03/11 202 lb 12.8 oz (92 kg)  08/13/11 208 lb 6.4 oz (94.5 kg)  03/11/11 232 lb (105.2 kg)  01/07/11 222 lb (100.7 kg)    Body mass index is 28.02 kg/m. Patient meets criteria for overweight based on current BMI.   Current diet order is regular, patient is consuming approximately 100% of meals at this time. Labs and medications reviewed.   No nutrition interventions warranted at this time. If nutrition issues arise, please consult RD.   Nathan Anis. Aaliyha Mumford, MS, RD LDN Inpatient Clinical Dietitian Pager [phone]

## 2017-10-14 NOTE — Progress Notes (Signed)
PROGRESS NOTE  [name] YHC:[id number] DOB: 1956-03-02 DOA: 10/13/2017 PCP: Aura Dials, MD  HPI/Recap of past 24 hours:  Feeling better, fever subsided Family at bedside  Assessment/Plan: Principal Problem:   Perirectal abscess Active Problems:   Bronchiectasis ([place])   Malignant neoplasm of prostate ([place])   Sepsis ([place])  Perirectal abscess with sepsis on presentation -with fever and leukocytosis on presentation -S/p I/D on 7/24 in the office -he developed fever after left general surgery office, he presented to the ED -wound culture in process, ct ab/pel on presentation "2.1 x 1.5 x 1.8 cm thick-walled air in fluid collection in the right posterior perianal region consistent with an abscess" -blood culture in process -continue abx, awaiting culture result -general surgery consulted  H/o bronchiectasis , with h/o pseudomonas pneumonia in the last Ct ab/pel" Postsurgical changes at the right lung base. Bronchiectasis and diffuse nodularity within the lingula and left base, consistent with respiratory infection, possible atypical pneumonia"  Patient denies new respiratory symptom  prostate cancer ,  Small focal hypodensity at the base of the right prostate possibly due to cyst or post biopsy change on ct ab/pel Slow growth of sclerotic lesion within the left posterior iliac bone since 2015. he is followed by urology Dr Tresa Moore  Code Status: full  Family Communication: patient and wife   Disposition Plan: home in 1-2 days, pending culture result   Consultants:  General surgery  Procedures:  none  Antibiotics:  As above   Objective: BP 121/73 (BP Location: Left Arm)   Pulse 88   Temp 97.7 F (36.5 C) (Oral)   Resp 18   Ht 5\' 8"  (1.727 m)   Wt 83.6 kg (184 lb 4.9 oz)   SpO2 93%   BMI 28.02 kg/m   Intake/Output Summary (Last 24 hours) at 10/14/2017 1651 Last data filed at 10/14/2017 1536 Gross per 24 hour  Intake 2169.77 ml  Output -  Net  2169.77 ml   Filed Weights   10/14/17 0215  Weight: 83.6 kg (184 lb 4.9 oz)    Exam: Patient is examined daily including today on 10/14/2017, exams remain the same as of yesterday except that has changed    General:  NAD  Cardiovascular: RRR  Respiratory: CTABL  Abdomen: Soft/ND/NT, positive BS  Musculoskeletal: No Edema  Neuro: alert, oriented   Data Reviewed: Basic Metabolic Panel: Recent Labs  Lab 10/13/17 2055 10/14/17 0110  NA 137  --   K 4.0  --   CL 105  --   CO2 18*  --   GLUCOSE 100*  --   BUN 29*  --   CREATININE 1.09 1.20  CALCIUM 9.4  --    Liver Function Tests: Recent Labs  Lab 10/13/17 2055  AST 25  ALT 36  ALKPHOS 135*  BILITOT 1.0  PROT 8.0  ALBUMIN 3.7   No results for input(s): LIPASE, AMYLASE in the last 168 hours. No results for input(s): AMMONIA in the last 168 hours. CBC: Recent Labs  Lab 10/13/17 2055 10/14/17 0110 10/14/17 0539  WBC 19.4* 15.5* 9.1  NEUTROABS 18.1*  --   --   HGB 17.2* 14.9 13.9  HCT 51.6 45.0 42.7  MCV 89.9 90.5 91.8  PLT 292 237 181   Cardiac Enzymes:   No results for input(s): CKTOTAL, CKMB, CKMBINDEX, TROPONINI in the last 168 hours. BNP (last 3 results) No results for input(s): BNP in the last 8760 hours.  ProBNP (last 3 results) No results for input(s):  PROBNP in the last 8760 hours.  CBG: No results for input(s): GLUCAP in the last 168 hours.  Recent Results (from the past 240 hour(s))  Culture, blood (Routine x 2)     Status: None (Preliminary result)   Collection Time: 10/13/17  8:30 PM  Result Value Ref Range Status   Specimen Description BLOOD LEFT ARM  Final   Special Requests   Final    BOTTLES DRAWN AEROBIC AND ANAEROBIC Blood Culture results may not be optimal due to an excessive volume of blood received in culture bottles   Culture   Final    NO GROWTH < 24 HOURS Performed at [facility] [address], [place], [place] [address]    Report Status PENDING  Incomplete    Culture, blood (Routine x 2)     Status: None (Preliminary result)   Collection Time: 10/13/17  8:50 PM  Result Value Ref Range Status   Specimen Description BLOOD LEFT HAND  Final   Special Requests   Final    BOTTLES DRAWN AEROBIC ONLY Blood Culture results may not be optimal due to an excessive volume of blood received in culture bottles   Culture   Final    NO GROWTH < 24 HOURS Performed at [facility], [place] [address], [place], [place] [address]    Report Status PENDING  Incomplete  Aerobic Culture (superficial specimen)     Status: None (Preliminary result)   Collection Time: 10/14/17 11:31 AM  Result Value Ref Range Status   Specimen Description PERIRECTAL  Final   Special Requests NONE  Final   Gram Stain   Final    RARE WBC PRESENT, PREDOMINANTLY PMN RARE GRAM POSITIVE RODS RARE GRAM POSITIVE COCCI Performed at [facility], [place] [address] [place], [place] [address]    Culture PENDING  Incomplete   Report Status PENDING  Incomplete     Studies: Dg Chest 2 View  Result Date: 10/13/2017 CLINICAL DATA:  Fever, dyspnea on exertion. EXAM: CHEST - 2 VIEW COMPARISON:  CT scan of March 31, 2016. Radiographs of March 24, 2015. FINDINGS: Stable cardiomediastinal silhouette. No pneumothorax is noted. Stable right apical scarring and postsurgical changes are noted. Stable probable loculated right pleural effusion is noted with associated right basilar scarring. Left lung is hyperexpanded with mediastinal shift to the right consistent with volume loss. This is unchanged. Bony thorax is unremarkable. IMPRESSION: Stable scarring and postsurgical changes are seen in the right hemithorax. Stable mild probable loculated right pleural effusion is noted. No acute abnormality is noted. Electronically Signed   By: Marijo Conception, M.D.   On: 10/13/2017 21:32   Ct Abdomen Pelvis W Contrast  Result Date: 10/13/2017 CLINICAL DATA:  Sepsis recent diagnosis of prostate cancer, I and  D of right perirectal abscess EXAM: CT ABDOMEN AND PELVIS WITH CONTRAST TECHNIQUE: Multidetector CT imaging of the abdomen and pelvis was performed using the standard protocol following bolus administration of intravenous contrast. CONTRAST:  145mL OMNIPAQUE IOHEXOL 300 MG/ML  SOLN COMPARISON:  CT 04/20/2013 FINDINGS: Lower chest: Lung bases demonstrate volume loss on the right. Bronchiectasis in the right lower lobe with postsurgical changes at the right base. There is pleural and parenchymal scarring and pleural calcification. Mild bronchiectasis in the lingula and left lung base. Scattered tiny nodules throughout the lingula and left lung base. Hepatobiliary: No focal liver abnormality is seen. No gallstones, gallbladder wall thickening, or biliary dilatation. Pancreas: Unremarkable. No pancreatic ductal dilatation or  surrounding inflammatory changes. Spleen: Normal in size without focal abnormality. Adrenals/Urinary Tract: Punctate stone upper pole right kidney. Adrenal glands are normal. No hydronephrosis. The bladder is normal Stomach/Bowel: Stomach is within normal limits. Appendix not well seen, consistent with history of appendectomy. No evidence of bowel wall thickening, distention, or inflammatory changes. Vascular/Lymphatic: Nonaneurysmal aorta. No significantly enlarged lymph nodes. Minimal aortic atherosclerosis. Reproductive: No significant enlargement. Small focal hypodensity at the right base of the prostate. Other: Thick-walled air in fluid collection in the right posterior perianal region measuring 2.1 x 1.5 by 1.8 cm consistent with abscess. Moderate surrounding edema. Musculoskeletal: No acute osseous abnormality. Degenerative changes of the spine. Slight interval increase in size of a focal sclerotic lesion in the left posterior iliac bone since 2015. it measures 9 mm in size, compared with 4 mm previously. IMPRESSION: 1. 2.1 x 1.5 x 1.8 cm thick-walled air in fluid collection in the right  posterior perianal region consistent with an abscess. 2. Small focal hypodensity at the base of the right prostate possibly due to cyst or post biopsy change. 3. Postsurgical changes at the right lung base. Bronchiectasis and diffuse nodularity within the lingula and left base, consistent with respiratory infection, possible atypical pneumonia. 4. Slow growth of sclerotic lesion within the left posterior iliac bone since 2015. Electronically Signed   By: Donavan Foil M.D.   On: 10/13/2017 23:21    Scheduled Meds: . albuterol  2.5 mg Inhalation QID  . [START ON 10/21/2017] Colistimethate Sodium  2 mg Nebulization Daily  . docusate sodium  200 mg Oral QHS  . dornase alpha  2.5 mg Nebulization Daily  . enoxaparin (LOVENOX) injection  40 mg Subcutaneous Q24H  . ferrous sulfate  325 mg Oral BID  . HYDROcodone-acetaminophen  1 tablet Oral TID  . mometasone-formoterol  2 puff Inhalation BID  . pantoprazole  40 mg Oral Daily  . polycarbophil   Oral Daily  . Sodium Chloride (Inhalant)  1 Dose Nebulization QID  . tobramycin (PF)  300 mg Nebulization BID  . vitamin C  1,000 mg Oral BID    Continuous Infusions: . piperacillin-tazobactam (ZOSYN)  IV 3.375 g (10/14/17 1213)     Time spent: 42mins ,case discussed with general surgery I have personally reviewed and interpreted on  10/14/2017 daily labs,  imagings as discussed above under date review session and assessment and plans.  I reviewed all nursing notes, pharmacy notes, consultant notes,  vitals, pertinent old records  I have discussed plan of care as described above with RN , patient and family on 10/14/2017   Florencia Reasons MD, PhD  [facility] Pager [phone]. If 7PM-7AM, please contact night-coverage at [email or web address], password [facility] 10/14/2017, 4:51 PM  LOS: 0 days

## 2017-10-14 NOTE — Progress Notes (Signed)
Patient was educated and encouraged to take sitz baths PRN. Will continue to monitor.

## 2017-10-14 NOTE — Consult Note (Signed)
[facility] Surgery Consult Note  [name] 1955/07/11  [id number].    Requesting MD: Erlinda Hong Chief Complaint/Reason for Consult: perirectal abscess  HPI:  Patient is a 62 year old male s/p I&D of perirectal abscess yesterday at [facility] office. Yesterday evening at home patient developed fever and rigors and came to [facility] for evaluation. WBC found to be 19,000 and patient febrile. CT abdomen/pelvis shows 2.1 x 1.5 x 1.8 cm thick-walled air in fluid collection in the right posterior perianal region consistent with an abscess. Patient reports tenderness is not very different. Has had wound packed. Has not done sitz bath yet, but understands importance of this. Patient reports still draining small amount of blood.   ROS: Review of Systems  Constitutional: Positive for chills and fever.  Respiratory: Positive for shortness of breath. Negative for wheezing.   Cardiovascular: Negative for chest pain and palpitations.  Gastrointestinal: Negative for abdominal pain, blood in stool, constipation, diarrhea, melena, nausea and vomiting.  Genitourinary: Negative for dysuria, frequency and urgency.  All other systems reviewed and are negative.   Family History  Problem Relation Age of Onset  . Stroke Mother   . Alzheimer's disease Mother   . Transient ischemic attack Mother   . Chorea Mother   . Liver disease Father   . Diabetes Father   . Congestive Heart Failure Father   . Other Other        sibling with possible right middle lobe issues  . Ataxia Brother   . Ataxia Maternal Aunt   . Prostate cancer Maternal Grandfather   . Prostate cancer Cousin   . Prostate cancer Cousin   . Prostate cancer Cousin     Past Medical History:  Diagnosis Date  . Bronchiectasis ([place])   . Bronchiolitis   . Dizziness and giddiness 07/26/2012  . DJD (degenerative joint disease)   . Gastroesophageal reflux disease   . HTN (hypertension)   . OSA (obstructive sleep apnea)   .  Pneumonia   . Pneumonia with lung abscess ([place])   . Prostate cancer ([place])   . Tear of left rotator cuff     Past Surgical History:  Procedure Laterality Date  . APPENDECTOMY  1969  . BACK SURGERY     multiple, DJD, cervical fusion, plate removed  . CARPAL TUNNEL RELEASE Right 1999  . COLONOSCOPY  12/1980   bleeding from penis and anus after beach trip; colonoscopy and removed urinary stricture at same time  . KNEE ARTHROSCOPY     left  . LUNG SURGERY  2012,    removed right middle lobe,due to birth defect, removed half of right bottom lobe due to abcess  . NECK SURGERY  1601,0932, 2002  . OTHER SURGICAL HISTORY     removed 4" of lower right rib  . OTHER SURGICAL HISTORY     has had 8 PICC lines placed over the course of several years.  . Right video-assisted thoracoscopic surgery,  12/05/2010  . ROTATOR CUFF REPAIR     no surgery, because of lung infection  . TONSILLECTOMY  1965  . VIDEO BRONCHOSCOPY     Dr Arlyce Dice    Social History:  reports that he has never smoked. He has never used smokeless tobacco. He reports that he does not drink alcohol or use drugs.  Allergies:  Allergies  Allergen Reactions  . Gentamicin Other (See Comments)    BALANCE ISSUES  . Morphine And Related Nausea And Vomiting  . Tobramycin Other (See  Comments) and Nausea And Vomiting    ototoxicity    Medications Prior to Admission  Medication Sig Dispense Refill  . albuterol (PROVENTIL HFA;VENTOLIN HFA) 108 (90 Base) MCG/ACT inhaler Inhale into the lungs every 6 (six) hours as needed for wheezing or shortness of breath.    [name] albuterol (PROVENTIL) (2.5 MG/3ML) 0.083% nebulizer solution Inhale 2.5 mg into the lungs 4 (four) times daily.     . Ascorbic Acid (VITAMIN C) 1000 MG tablet Take 1,000 mg by mouth 2 (two) times daily.    . budesonide-formoterol (SYMBICORT) 160-4.5 MCG/ACT inhaler Inhale 2 puffs into the lungs 2 (two) times daily.    . Colistimethate Sodium POWD Take 2 mg by nebulization  daily. 28 days on, 28 days off. Mix with 43m sterile water.    . Cyanocobalamin (B-12 COMPLIANCE INJECTION) 1000 MCG/ML KIT Inject 1,000 mcg as directed once a week. Pt. Takes on wednesdays    . dornase alpha (PULMOZYME) 1 MG/ML nebulizer solution Take 2.5 mg by nebulization daily.     . Ferrous Sulfate (IRON) 325 (65 Fe) MG TABS Take 325 mg by mouth 2 (two) times daily.     .Marland KitchenHYDROcodone-acetaminophen (NORCO) 7.5-325 MG tablet Take 1 tablet by mouth 3 (three) times daily.     .Marland KitchenHYDROcodone-homatropine (HYCODAN) 5-1.5 MG/5ML syrup Take 5 mLs by mouth every 6 (six) hours as needed for cough. 240 mL 0  . linaclotide (LINZESS) 145 MCG CAPS capsule Take 145 mcg by mouth daily before breakfast.    . NON FORMULARY Use Acapella twice daily for airway clearance. Dx. 494.0. Mucous clearing device.    .Marland Kitchenomeprazole (PRILOSEC) 20 MG capsule Take 20 mg by mouth 2 (two) times daily.     . polyethylene glycol (MIRALAX / GLYCOLAX) packet Take 17 g by mouth daily as needed.    .Marland KitchenRespiratory Therapy Supplies (FLUTTER) DEVI Use several times daily as needed for congestion/thick mucus    . Sodium Chloride, Inhalant, 7 % NEBU Take 1 Dose by nebulization 4 (four) times daily.     . tadalafil (CIALIS) 20 MG tablet Take 20 mg by mouth daily as needed for erectile dysfunction.     .Marland Kitchentobramycin, PF, ([name]) 300 MG/5ML nebulizer solution Take 300 mg by nebulization 2 (two) times daily. 28 days on. 28 days off    . Water For Irrigation, Sterile (STERILE WATER FOR IRRIGATION) 4 mLs daily. Mix 469mwith colistimethate powder to use for nebulization.    . Wheat Dextrin (BENEFIBER PO) Take 2 Doses by mouth daily.       Blood pressure 118/77, pulse 81, temperature 98.4 F (36.9 C), resp. rate 16, height '5\' 8"'  (1.727 m), weight 184 lb 4.9 oz (83.6 kg), SpO2 96 %. Physical Exam: Physical Exam  Constitutional: He is oriented to person, place, and time. He appears well-developed and well-nourished. He is cooperative.  Non-toxic  appearance. No distress.  HENT:  Head: Normocephalic and atraumatic.  Right Ear: External ear normal.  Left Ear: External ear normal.  Nose: Nose normal.  Mouth/Throat: Oropharynx is clear and moist and mucous membranes are normal.  Eyes: Pupils are equal, round, and reactive to light. Conjunctivae, EOM and lids are normal. No scleral icterus.  Neck: Normal range of motion and phonation normal. Neck supple.  Cardiovascular: Normal rate and regular rhythm.  Pulses:      Radial pulses are 2+ on the right side, and 2+ on the left side.       Dorsalis pedis pulses are  2+ on the right side, and 2+ on the left side.  Pulmonary/Chest: Effort normal. He has no decreased breath sounds. He has no wheezes. He has no rhonchi. He has no rales.  Abdominal: Soft. Bowel sounds are normal. He exhibits no distension. There is no hepatosplenomegaly. There is no tenderness.  Genitourinary: Penis normal.  Genitourinary Comments: Small opening in R buttock adjacent to anus, draining minimal purulence, TTP, surrounding erythema and induration   Musculoskeletal:  ROM grossly intact in BL upper and lower extremities  Neurological: He is alert and oriented to person, place, and time. He has normal strength. No sensory deficit. Gait normal.  Skin: Skin is warm, dry and intact. No rash noted.  Psychiatric: He has a normal mood and affect. His speech is normal and behavior is normal.    Results for orders placed or performed during the hospital encounter of 10/13/17 (from the past 48 hour(s))  Urinalysis, Routine w reflex microscopic     Status: None   Collection Time: 10/13/17  8:50 PM  Result Value Ref Range   Color, Urine YELLOW YELLOW   APPearance CLEAR CLEAR   Specific Gravity, Urine 1.024 1.005 - 1.030   pH 5.0 5.0 - 8.0   Glucose, UA NEGATIVE NEGATIVE mg/dL   Hgb urine dipstick NEGATIVE NEGATIVE   Bilirubin Urine NEGATIVE NEGATIVE   Ketones, ur NEGATIVE NEGATIVE mg/dL   Protein, ur NEGATIVE NEGATIVE  mg/dL   Nitrite NEGATIVE NEGATIVE   Leukocytes, UA NEGATIVE NEGATIVE    Comment: Performed at [facility] [address], [place], [place] [address]  Comprehensive metabolic panel     Status: Abnormal   Collection Time: 10/13/17  8:55 PM  Result Value Ref Range   Sodium 137 135 - 145 mmol/L   Potassium 4.0 3.5 - 5.1 mmol/L   Chloride 105 98 - 111 mmol/L   CO2 18 (L) 22 - 32 mmol/L   Glucose, Bld 100 (H) 70 - 99 mg/dL   BUN 29 (H) 8 - 23 mg/dL   Creatinine, Ser 1.09 0.61 - 1.24 mg/dL   Calcium 9.4 8.9 - 10.3 mg/dL   Total Protein 8.0 6.5 - 8.1 g/dL   Albumin 3.7 3.5 - 5.0 g/dL   AST 25 15 - 41 U/L   ALT 36 0 - 44 U/L   Alkaline Phosphatase 135 (H) 38 - 126 U/L   Total Bilirubin 1.0 0.3 - 1.2 mg/dL   GFR calc non Af Amer >60 >60 mL/min   GFR calc Af Amer >60 >60 mL/min    Comment: (NOTE) The eGFR has been calculated using the CKD EPI equation. This calculation has not been validated in all clinical situations. eGFR's persistently <60 mL/min signify possible Chronic Kidney Disease.    Anion gap 14 5 - 15    Comment: Performed at [facility] [address] [place], [place] [address]  I-Stat CG4 Lactic Acid, ED     Status: None   Collection Time: 10/13/17  8:55 PM  Result Value Ref Range   Lactic Acid, Venous 1.12 0.5 - 1.9 mmol/L  CBC with Differential     Status: Abnormal   Collection Time: 10/13/17  8:55 PM  Result Value Ref Range   WBC 19.4 (H) 4.0 - 10.5 K/uL   RBC 5.74 4.22 - 5.81 MIL/uL   Hemoglobin 17.2 (H) 13.0 - 17.0 g/dL   HCT 51.6 39.0 - 52.0 %   MCV 89.9 78.0 - 100.0 fL   MCH 30.0 26.0 -  34.0 pg   MCHC 33.3 30.0 - 36.0 g/dL   RDW 12.4 11.5 - 15.5 %   Platelets 292 150 - 400 K/uL   Neutrophils Relative % 93 %   Neutro Abs 18.1 (H) 1.7 - 7.7 K/uL   Lymphocytes Relative 2 %   Lymphs Abs 0.4 (L) 0.7 - 4.0 K/uL   Monocytes Relative 3 %   Monocytes Absolute 0.6 0.1 - 1.0 K/uL   Eosinophils Relative 1 %   Eosinophils Absolute 0.1 0.0 - 0.7 K/uL    Basophils Relative 0 %   Basophils Absolute 0.0 0.0 - 0.1 K/uL   Immature Granulocytes 1 %   Abs Immature Granulocytes 0.1 0.0 - 0.1 K/uL    Comment: Performed at [facility] [address], [place], [place] [address]  Protime-INR     Status: None   Collection Time: 10/13/17  8:55 PM  Result Value Ref Range   Prothrombin Time 13.1 11.4 - 15.2 seconds   INR 1.00     Comment: Performed at [facility] [address], [place], [place] [address]  CBC     Status: Abnormal   Collection Time: 10/14/17  1:10 AM  Result Value Ref Range   WBC 15.5 (H) 4.0 - 10.5 K/uL   RBC 4.97 4.22 - 5.81 MIL/uL   Hemoglobin 14.9 13.0 - 17.0 g/dL   HCT 45.0 39.0 - 52.0 %   MCV 90.5 78.0 - 100.0 fL   MCH 30.0 26.0 - 34.0 pg   MCHC 33.1 30.0 - 36.0 g/dL   RDW 12.4 11.5 - 15.5 %   Platelets 237 150 - 400 K/uL    Comment: Performed at [facility], [place] [address], [place], [place] [address]  Creatinine, serum     Status: None   Collection Time: 10/14/17  1:10 AM  Result Value Ref Range   Creatinine, Ser 1.20 0.61 - 1.24 mg/dL   GFR calc non Af Amer >60 >60 mL/min   GFR calc Af Amer >60 >60 mL/min    Comment: (NOTE) The eGFR has been calculated using the CKD EPI equation. This calculation has not been validated in all clinical situations. eGFR's persistently <60 mL/min signify possible Chronic Kidney Disease. Performed at [facility], [place] [address], [place] [address]   CBC     Status: None   Collection Time: 10/14/17  5:39 AM  Result Value Ref Range   WBC 9.1 4.0 - 10.5 K/uL   RBC 4.65 4.22 - 5.81 MIL/uL   Hemoglobin 13.9 13.0 - 17.0 g/dL   HCT 42.7 39.0 - 52.0 %   MCV 91.8 78.0 - 100.0 fL   MCH 29.9 26.0 - 34.0 pg   MCHC 32.6 30.0 - 36.0 g/dL   RDW 12.4 11.5 - 15.5 %   Platelets 181 150 - 400 K/uL    Comment: Performed at [facility], [place] [address], [place], [place] [address]   Dg Chest 2 View  Result Date: 10/13/2017 CLINICAL DATA:  Fever,  dyspnea on exertion. EXAM: CHEST - 2 VIEW COMPARISON:  CT scan of March 31, 2016. Radiographs of March 24, 2015. FINDINGS: Stable cardiomediastinal silhouette. No pneumothorax is noted. Stable right apical scarring and postsurgical changes are noted. Stable probable loculated right pleural effusion is noted with associated right basilar scarring. Left lung is hyperexpanded with mediastinal shift to the right consistent with volume loss. This is unchanged. Bony thorax is unremarkable. IMPRESSION: Stable scarring and  postsurgical changes are seen in the right hemithorax. Stable mild probable loculated right pleural effusion is noted. No acute abnormality is noted. Electronically Signed   By: Marijo Conception, M.D.   On: 10/13/2017 21:32   Ct Abdomen Pelvis W Contrast  Result Date: 10/13/2017 CLINICAL DATA:  Sepsis recent diagnosis of prostate cancer, I and D of right perirectal abscess EXAM: CT ABDOMEN AND PELVIS WITH CONTRAST TECHNIQUE: Multidetector CT imaging of the abdomen and pelvis was performed using the standard protocol following bolus administration of intravenous contrast. CONTRAST:  149m OMNIPAQUE IOHEXOL 300 MG/ML  SOLN COMPARISON:  CT 04/20/2013 FINDINGS: Lower chest: Lung bases demonstrate volume loss on the right. Bronchiectasis in the right lower lobe with postsurgical changes at the right base. There is pleural and parenchymal scarring and pleural calcification. Mild bronchiectasis in the lingula and left lung base. Scattered tiny nodules throughout the lingula and left lung base. Hepatobiliary: No focal liver abnormality is seen. No gallstones, gallbladder wall thickening, or biliary dilatation. Pancreas: Unremarkable. No pancreatic ductal dilatation or surrounding inflammatory changes. Spleen: Normal in size without focal abnormality. Adrenals/Urinary Tract: Punctate stone upper pole right kidney. Adrenal glands are normal. No hydronephrosis. The bladder is normal Stomach/Bowel: Stomach is  within normal limits. Appendix not well seen, consistent with history of appendectomy. No evidence of bowel wall thickening, distention, or inflammatory changes. Vascular/Lymphatic: Nonaneurysmal aorta. No significantly enlarged lymph nodes. Minimal aortic atherosclerosis. Reproductive: No significant enlargement. Small focal hypodensity at the right base of the prostate. Other: Thick-walled air in fluid collection in the right posterior perianal region measuring 2.1 x 1.5 by 1.8 cm consistent with abscess. Moderate surrounding edema. Musculoskeletal: No acute osseous abnormality. Degenerative changes of the spine. Slight interval increase in size of a focal sclerotic lesion in the left posterior iliac bone since 2015. it measures 9 mm in size, compared with 4 mm previously. IMPRESSION: 1. 2.1 x 1.5 x 1.8 cm thick-walled air in fluid collection in the right posterior perianal region consistent with an abscess. 2. Small focal hypodensity at the base of the right prostate possibly due to cyst or post biopsy change. 3. Postsurgical changes at the right lung base. Bronchiectasis and diffuse nodularity within the lingula and left base, consistent with respiratory infection, possible atypical pneumonia. 4. Slow growth of sclerotic lesion within the left posterior iliac bone since 2015. Electronically Signed   By: KDonavan FoilM.D.   On: 10/13/2017 23:21      Assessment/Plan Prostate cancer Bronchiectasis  HTN  Perirectal abscess - WBC 9.1 now, afebrile - wound is draining, cx sent - recommend abx and sitz baths - would not recommend further surgical intervention unless patient is failing to improve  We will sign off, please call with questions or concerns.   KBrigid Re [facility] 10/14/2017, 10:41 AM Pager: [phone]: [phone]-Fri 7:00 am-4:30 pm Sat-Sun 7:00 am-11:30 am

## 2017-10-14 NOTE — H&P (Signed)
History and Physical    [name] DOB: 01-Jul-1955 DOA: 10/13/2017  PCP: Gavin Pound, MD  Patient coming from: Home  I have personally briefly reviewed patient's old medical records in [place]  Chief Complaint: rigors  HPI: [name] is a 62 y.o. male with medical history significant of bronchiectasis prostate cancer presents with Reiger's.  Today patient had a perirectal abscess I&D and no vomiting outpatient office.  Shortly after he arrived home he started having Reiger's and nausea.  When he came here to the ED he was found to be febrile.  He had a white count of 19,000.  CT abdomen and pelvis with contrast showed a small fluid collection consistent with abscess around the perirectal region on the right.  There was also some changes on his right lung which may be consistent with atypical infection.  Patient is not having any new respiratory complaints at this time.  He is status post partial lobectomy on the right due to bronchiectasis.  Of note patient is recent sinus pulmonologist stated he is stable as far  as his lungs function.  Currently has stage I prostate cancer and is scheduled for prostatectomy in September of this year. ED Course: Patient was empirically treated with Zosyn and vancomycin in the ED and given some pain control.  Review of Systems: Positive Rigors  fever perirectal tenderness nausea chronic cough All others reviewed and are otherwise negative other than those mentioned above per HPI  Past Medical History:  Diagnosis Date  . Bronchiectasis ([place])   . Bronchiolitis   . Dizziness and giddiness 07/26/2012  . DJD (degenerative joint disease)   . Gastroesophageal reflux disease   . HTN (hypertension)   . OSA (obstructive sleep apnea)   . Pneumonia   . Pneumonia with lung abscess ([place])   . Prostate cancer ([place])   . Tear of left rotator cuff     Past Surgical History:  Procedure Laterality Date  . APPENDECTOMY  1969  . BACK  SURGERY     multiple, DJD, cervical fusion, plate removed  . CARPAL TUNNEL RELEASE Right 1999  . COLONOSCOPY  12/1980   bleeding from penis and anus after beach trip; colonoscopy and removed urinary stricture at same time  . KNEE ARTHROSCOPY     left  . LUNG SURGERY  2012,    removed right middle lobe,due to birth defect, removed half of right bottom lobe due to abcess  . NECK SURGERY  3009,2330, 2002  . OTHER SURGICAL HISTORY     removed 4" of lower right rib  . OTHER SURGICAL HISTORY     has had 8 PICC lines placed over the course of several years.  . Right video-assisted thoracoscopic surgery,  12/05/2010  . ROTATOR CUFF REPAIR     no surgery, because of lung infection  . TONSILLECTOMY  1965  . 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 He has never used smokeless tobacco. He reports that he does not drink alcohol or use drugs.  Allergies  Allergen Reactions  . Gentamicin Other (See Comments)    BALANCE ISSUES  . Morphine And Related Nausea And Vomiting  . Tobramycin Other (See Comments) and Nausea And Vomiting    ototoxicity    Family History  Problem Relation Age of Onset  . Stroke Mother   . Alzheimer's disease Mother   . Transient ischemic attack Mother   . Chorea  Mother   . Liver disease Father   . Diabetes Father   . Congestive Heart Failure Father   . Other Other        sibling with possible right middle lobe issues  . Ataxia Brother   . Ataxia Maternal Aunt   . Prostate cancer Maternal Grandfather   . Prostate cancer Cousin   . Prostate cancer Cousin   . Prostate cancer Cousin   As above also bronchiectasis and 2 brothers daughter and one grandchild  Prior to Admission medications   Medication Sig Start Date End Date Taking? Authorizing Provider  albuterol (PROVENTIL HFA;VENTOLIN HFA) 108 (90 Base) MCG/ACT inhaler Inhale into the lungs every 6 (six) hours as needed for wheezing or shortness of breath.   Yes [provider]  albuterol (PROVENTIL) (2.5 MG/3ML) 0.083% nebulizer solution Inhale 2.5 mg into the lungs 4 (four) times daily.    Yes [provider]  Ascorbic Acid (VITAMIN C) 1000 MG tablet Take 1,000 mg by mouth 2 (two) times daily.   Yes [provider]  budesonide-formoterol (SYMBICORT) 160-4.5 MCG/ACT inhaler Inhale 2 puffs into the lungs 2 (two) times daily.   Yes [provider]  Colistimethate Sodium POWD Take 2 mg by nebulization daily. 28 days on, 28 days off. Mix with 50m sterile water.   Yes [provider]  Cyanocobalamin (B-12 COMPLIANCE INJECTION) 1000 MCG/ML KIT Inject 1,000 mcg as directed once a week. Pt. Takes on wednesdays   Yes [provider]  dornase alpha (PULMOZYME) 1 MG/ML nebulizer solution Take 2.5 mg by nebulization daily.  07/06/17 07/06/18 Yes [provider]  Ferrous Sulfate (IRON) 325 (65 Fe) MG TABS Take 325 mg by mouth 2 (two) times daily.    Yes [provider]  HYDROcodone-acetaminophen (NORCO) 7.5-325 MG tablet Take 1 tablet by mouth 3 (three) times daily.    Yes [provider]  HYDROcodone-homatropine (HYCODAN) 5-1.5 MG/5ML syrup Take 5 mLs by mouth every 6 (six) hours as needed for cough. 04/06/12  Yes WTanda Rockers MD  linaclotide (LINZESS) 145 MCG CAPS capsule Take 145 mcg by mouth daily before breakfast.   Yes [provider]  NON FORMULARY Use Acapella twice daily for airway clearance. Dx. 494.0. Mucous clearing device. 11/18/12  Yes [provider]  omeprazole (PRILOSEC) 20 MG capsule Take 20 mg by mouth 2 (two) times daily.    Yes [provider]  polyethylene glycol (MIRALAX / GLYCOLAX) packet Take 17 g by mouth daily as needed.   Yes [provider]  Respiratory Therapy Supplies (FLUTTER) DEVI Use several times daily as needed for congestion/thick mucus   Yes [provider]  Sodium Chloride, Inhalant, 7 % NEBU Take 1 Dose by  nebulization 4 (four) times daily.    Yes [provider]  tadalafil (CIALIS) 20 MG tablet Take 20 mg by mouth daily as needed for erectile dysfunction.  01/10/16  Yes [provider]  tobramycin, PF, ([name]) 300 MG/5ML nebulizer solution Take 300 mg by nebulization 2 (two) times daily. 28 days on. 28 days off   Yes [provider]  Water For Irrigation, Sterile (STERILE WATER FOR IRRIGATION) 4 mLs daily. Mix 436mwith colistimethate powder to use for nebulization.   Yes [provider]  Wheat Dextrin (BENEFIBER PO) Take 2 Doses by mouth daily.    Yes [provider]    Physical Exam: Vitals:   10/14/17 0000 10/14/17 0015 10/14/17 0030 10/14/17 0045  BP: 106/69 108/69  109/67 97/66  Pulse: 97 98 99 94  Resp:   15 16  Temp:      TempSrc:      SpO2: 95% 93% 93% 96%    Constitutional: NAD, calm, comfortable Vitals:   10/14/17 0000 10/14/17 0015 10/14/17 0030 10/14/17 0045  BP: 106/69 108/69 109/67 97/66  Pulse: 97 98 99 94  Resp:   15 16  Temp:      TempSrc:      SpO2: 95% 93% 93% 96%   Eyes: PERRL, lids and conjunctivae normal ENMT: Mucous membranes are moist. Posterior pharynx clear of any exudate or lesions.Normal dentition.  Neck: normal, supple, no masses, no thyromegaly Respiratory: decr right, ? Mild rhonchi on left . Normal respiratory effort. No accessory muscle use.  Cardiovascular: Regular rate and rhythm, no murmurs / rubs / gallops. No extremity edema. 2+ pedal pulses.   Abdomen: no tenderness, no masses palpated. . Bowel sounds positive.  Musculoskeletal: n No joint deformity upper and lower extremities.   Skin: no rashes, lesions, ulcers. No induration Neurologic: CN 2-12 grossly intact. Sensation intact, moves all ext equally  Psychiatric: Normal judgment and insight. Alert and oriented x 3. Normal mood.     Nickel sized induration : peri rectal abscess  Labs on Admission: I have personally reviewed following labs and  imaging studies  CBC: Recent Labs  Lab 10/13/17 2055  WBC 19.4*  NEUTROABS 18.1*  HGB 17.2*  HCT 51.6  MCV 89.9  PLT 570   Basic Metabolic Panel: Recent Labs  Lab 10/13/17 2055  NA 137  K 4.0  CL 105  CO2 18*  GLUCOSE 100*  BUN 29*  CREATININE 1.09  CALCIUM 9.4   GFR: CrCl cannot be calculated (Unknown ideal weight.). Liver Function Tests: Recent Labs  Lab 10/13/17 2055  AST 25  ALT 36  ALKPHOS 135*  BILITOT 1.0  PROT 8.0  ALBUMIN 3.7   No results for input(s): LIPASE, AMYLASE in the last 168 hours. No results for input(s): AMMONIA in the last 168 hours. Coagulation Profile: Recent Labs  Lab 10/13/17 2055  INR 1.00   Cardiac Enzymes: No results for input(s): CKTOTAL, CKMB, CKMBINDEX, TROPONINI in the last 168 hours. BNP (last 3 results) No results for input(s): PROBNP in the last 8760 hours. HbA1C: No results for input(s): HGBA1C in the last 72 hours. CBG: No results for input(s): GLUCAP in the last 168 hours. Lipid Profile: No results for input(s): CHOL, HDL, LDLCALC, TRIG, CHOLHDL, LDLDIRECT in the last 72 hours. Thyroid Function Tests: No results for input(s): TSH, T4TOTAL, FREET4, T3FREE, THYROIDAB in the last 72 hours. Anemia Panel: No results for input(s): VITAMINB12, FOLATE, FERRITIN, TIBC, IRON, RETICCTPCT in the last 72 hours. Urine analysis:    Component Value Date/Time   COLORURINE YELLOW 10/13/2017 2050   APPEARANCEUR CLEAR 10/13/2017 2050   LABSPEC 1.024 10/13/2017 2050   [place] 5.0 10/13/2017 2050   GLUCOSEU NEGATIVE 10/13/2017 2050   [place] NEGATIVE 10/13/2017 2050   [place] NEGATIVE 10/13/2017 2050   [place] 10/13/2017 2050   PROTEINUR NEGATIVE 10/13/2017 2050   UROBILINOGEN 0.2 12/03/2010 1212   NITRITE NEGATIVE 10/13/2017 2050   LEUKOCYTESUR NEGATIVE 10/13/2017 2050    Radiological Exams on Admission: Dg Chest 2 View  Result Date: 10/13/2017 CLINICAL DATA:  Fever, dyspnea on exertion. EXAM: CHEST - 2  VIEW COMPARISON:  CT scan of March 31, 2016. Radiographs of March 24, 2015. FINDINGS: Stable cardiomediastinal silhouette. No pneumothorax is noted. Stable right apical scarring and  postsurgical changes are noted. Stable probable loculated right pleural effusion is noted with associated right basilar scarring. Left lung is hyperexpanded with mediastinal shift to the right consistent with volume loss. This is unchanged. Bony thorax is unremarkable. IMPRESSION: Stable scarring and postsurgical changes are seen in the right hemithorax. Stable mild probable loculated right pleural effusion is noted. No acute abnormality is noted. Electronically Signed   By: Marijo Conception, M.D.   On: 10/13/2017 21:32   Ct Abdomen Pelvis W Contrast  Result Date: 10/13/2017 CLINICAL DATA:  Sepsis recent diagnosis of prostate cancer, I and D of right perirectal abscess EXAM: CT ABDOMEN AND PELVIS WITH CONTRAST TECHNIQUE: Multidetector CT imaging of the abdomen and pelvis was performed using the standard protocol following bolus administration of intravenous contrast. CONTRAST:  116m OMNIPAQUE IOHEXOL 300 MG/ML  SOLN COMPARISON:  CT 04/20/2013 FINDINGS: Lower chest: Lung bases demonstrate volume loss on the right. Bronchiectasis in the right lower lobe with postsurgical changes at the right base. There is pleural and parenchymal scarring and pleural calcification. Mild bronchiectasis in the lingula and left lung base. Scattered tiny nodules throughout the lingula and left lung base. Hepatobiliary: No focal liver abnormality is seen. No gallstones, gallbladder wall thickening, or biliary dilatation. Pancreas: Unremarkable. No pancreatic ductal dilatation or surrounding inflammatory changes. Spleen: Normal in size without focal abnormality. Adrenals/Urinary Tract: Punctate stone upper pole right kidney. Adrenal glands are normal. No hydronephrosis. The bladder is normal Stomach/Bowel: Stomach is within normal limits. Appendix not well  seen, consistent with history of appendectomy. No evidence of bowel wall thickening, distention, or inflammatory changes. Vascular/Lymphatic: Nonaneurysmal aorta. No significantly enlarged lymph nodes. Minimal aortic atherosclerosis. Reproductive: No significant enlargement. Small focal hypodensity at the right base of the prostate. Other: Thick-walled air in fluid collection in the right posterior perianal region measuring 2.1 x 1.5 by 1.8 cm consistent with abscess. Moderate surrounding edema. Musculoskeletal: No acute osseous abnormality. Degenerative changes of the spine. Slight interval increase in size of a focal sclerotic lesion in the left posterior iliac bone since 2015. it measures 9 mm in size, compared with 4 mm previously. IMPRESSION: 1. 2.1 x 1.5 x 1.8 cm thick-walled air in fluid collection in the right posterior perianal region consistent with an abscess. 2. Small focal hypodensity at the base of the right prostate possibly due to cyst or post biopsy change. 3. Postsurgical changes at the right lung base. Bronchiectasis and diffuse nodularity within the lingula and left base, consistent with respiratory infection, possible atypical pneumonia. 4. Slow growth of sclerotic lesion within the left posterior iliac bone since 2015. Electronically Signed   By: KDonavan FoilM.D.   On: 10/13/2017 23:21     Assessment/Plan Principal Problem:   Perirectal abscess    Sepsis Active Problems:   [place] ([place]   Malignant neoplasm of prostate ([facility]    -Inpatient admission to MedSurg bed.  IV Zosyn.  Follow blood cultures.  Supportive care. LA wnl, Hr much improved. BP stable  -cont Taking home inhalers and pulmonary toilet.  Questionable changes of atypical pneumonia on abdominal CT found coincidently.  However patient does not complain of any new resp complaints If   Patient does develop resp symptoms  consider broadening patient's antibiotics to double  cover Pseudomonas.     DVT  prophylaxis: Lovenox  Code Status: Full Family Communication: Explained to patient and wife at bedside Disposition Plan: suspect d/c home in 2 days with Po abx  Admission status: Inpt med/surg  Shelbie Proctor MD [facility] Pager [phone]  If 7PM-7AM, please contact night-coverage [email or web address] Password [facility]  10/14/2017, 12:55 AM

## 2017-10-15 LAB — BASIC METABOLIC PANEL
Anion gap: 10 (ref 5–15)
BUN: 23 mg/dL (ref 8–23)
CO2: 25 mmol/L (ref 22–32)
Calcium: 8.9 mg/dL (ref 8.9–10.3)
Chloride: 106 mmol/L (ref 98–111)
Creatinine, Ser: 1.2 mg/dL (ref 0.61–1.24)
GFR calc Af Amer: >60 mL/min (ref >60)
GFR calc non Af Amer: >60 mL/min (ref >60)
Glucose, Bld: 90 mg/dL (ref 70–99)
Potassium: 3.8 mmol/L (ref 3.5–5.1)
Sodium: 141 mmol/L (ref 135–145)

## 2017-10-15 LAB — CBC
HCT: 43.7 % (ref 39.0–52.0)
Hemoglobin: 14.3 g/dL (ref 13.0–17.0)
MCH: 30 pg (ref 26.0–34.0)
MCHC: 32.7 g/dL (ref 30.0–36.0)
MCV: 91.6 fL (ref 78.0–100.0)
Platelets: 185 10*3/uL (ref 150–400)
RBC: 4.77 MIL/uL (ref 4.22–5.81)
RDW: 12.5 % (ref 11.5–15.5)
WBC: 3.2 10*3/uL — ABNORMAL LOW (ref 4.0–10.5)

## 2017-10-15 NOTE — Progress Notes (Signed)
PROGRESS NOTE  [name] BSJ:[id number] DOB: 07-21-1955 DOA: 10/13/2017 PCP: Aura Dials, MD  HPI/Recap of past 24 hours:  No fever last 24hrs Family at bedside  Assessment/Plan: Principal Problem:   Perirectal abscess Active Problems:   Bronchiectasis ([place])   Malignant neoplasm of prostate ([place])   Sepsis ([place])  Perirectal abscess with sepsis on presentation -with fever and leukocytosis on presentation -S/p I/D on 7/24 in the office -he developed fever after left general surgery office, he presented to the ED -wound culture in process, ct ab/pel on presentation "2.1 x 1.5 x 1.8 cm thick-walled air in fluid collection in the right posterior perianal region consistent with an abscess" -blood culture no growth, wound culture in process -continue abx, awaiting culture result -general surgery consulted  H/o bronchiectasis , with h/o pseudomonas pneumonia in the last Ct ab/pel" Postsurgical changes at the right lung base. Bronchiectasis and diffuse nodularity within the lingula and left base, consistent with respiratory infection, possible atypical pneumonia"  Patient denies new respiratory symptom  prostate cancer ,  Small focal hypodensity at the base of the right prostate possibly due to cyst or post biopsy change on ct ab/pel Slow growth of sclerotic lesion within the left posterior iliac bone since 2015. he is followed by urology Dr Tresa Moore  Code Status: full  Family Communication: patient and wife   Disposition Plan: home in 1-2 days, pending culture result   Consultants:  General surgery  Procedures:  none  Antibiotics:  As above   Objective: BP 127/83   Pulse 79   Temp 97.8 F (36.6 C) (Oral)   Resp 18   Ht 5\' 8"  (1.727 m)   Wt 83.6 kg (184 lb 4.9 oz)   SpO2 96%   BMI 28.02 kg/m   Intake/Output Summary (Last 24 hours) at 10/15/2017 1420 Last data filed at 10/15/2017 1100 Gross per 24 hour  Intake 300 ml  Output -  Net 300 ml    Filed Weights   10/14/17 0215  Weight: 83.6 kg (184 lb 4.9 oz)    Exam: Patient is examined daily including today on 10/15/2017, exams remain the same as of yesterday except that has changed    General:  NAD  Cardiovascular: RRR  Respiratory: CTABL  Abdomen: Soft/ND/NT, positive BS  Musculoskeletal: No Edema  Neuro: alert, oriented   Data Reviewed: Basic Metabolic Panel: Recent Labs  Lab 10/13/17 2055 10/14/17 0110 10/15/17 0517  NA 137  --  141  K 4.0  --  3.8  CL 105  --  106  CO2 18*  --  25  GLUCOSE 100*  --  90  BUN 29*  --  23  CREATININE 1.09 1.20 1.20  CALCIUM 9.4  --  8.9   Liver Function Tests: Recent Labs  Lab 10/13/17 2055  AST 25  ALT 36  ALKPHOS 135*  BILITOT 1.0  PROT 8.0  ALBUMIN 3.7   No results for input(s): LIPASE, AMYLASE in the last 168 hours. No results for input(s): AMMONIA in the last 168 hours. CBC: Recent Labs  Lab 10/13/17 2055 10/14/17 0110 10/14/17 0539 10/15/17 0517  WBC 19.4* 15.5* 9.1 3.2*  NEUTROABS 18.1*  --   --   --   HGB 17.2* 14.9 13.9 14.3  HCT 51.6 45.0 42.7 43.7  MCV 89.9 90.5 91.8 91.6  PLT 292 237 181 185   Cardiac Enzymes:   No results for input(s): CKTOTAL, CKMB, CKMBINDEX, TROPONINI in the last 168 hours. BNP (last 3  results) No results for input(s): BNP in the last 8760 hours.  ProBNP (last 3 results) No results for input(s): PROBNP in the last 8760 hours.  CBG: No results for input(s): GLUCAP in the last 168 hours.  Recent Results (from the past 240 hour(s))  Culture, blood (Routine x 2)     Status: None (Preliminary result)   Collection Time: 10/13/17  8:30 PM  Result Value Ref Range Status   Specimen Description BLOOD LEFT ARM  Final   Special Requests   Final    BOTTLES DRAWN AEROBIC AND ANAEROBIC Blood Culture results may not be optimal due to an excessive volume of blood received in culture bottles   Culture   Final    NO GROWTH 2 DAYS Performed at [facility] [address], [place], [place] [address]    Report Status PENDING  Incomplete  Culture, blood (Routine x 2)     Status: None (Preliminary result)   Collection Time: 10/13/17  8:50 PM  Result Value Ref Range Status   Specimen Description BLOOD LEFT HAND  Final   Special Requests   Final    BOTTLES DRAWN AEROBIC ONLY Blood Culture results may not be optimal due to an excessive volume of blood received in culture bottles   Culture   Final    NO GROWTH 2 DAYS Performed at [facility], [place] [address] [place],  [address]    Report Status PENDING  Incomplete  Aerobic Culture (superficial specimen)     Status: None (Preliminary result)   Collection Time: 10/14/17 11:31 AM  Result Value Ref Range Status   Specimen Description PERIRECTAL  Final   Special Requests NONE  Final   Gram Stain   Final    RARE WBC PRESENT, PREDOMINANTLY PMN RARE GRAM POSITIVE RODS RARE GRAM POSITIVE COCCI    Culture   Final    CULTURE REINCUBATED FOR BETTER GROWTH Performed at [facility], [name] [address], [place],  [address]    Report Status PENDING  Incomplete     Studies: No results found.  Scheduled Meds: . albuterol  2.5 mg Inhalation QID  . [START ON 10/21/2017] Colistimethate Sodium  2 mg Nebulization Daily  . docusate sodium  200 mg Oral QHS  . dornase alpha  2.5 mg Nebulization Daily  . enoxaparin (LOVENOX) injection  40 mg Subcutaneous Q24H  . ferrous sulfate  325 mg Oral BID  . HYDROcodone-acetaminophen  1 tablet Oral TID  . mometasone-formoterol  2 puff Inhalation BID  . pantoprazole  40 mg Oral Daily  . polycarbophil   Oral Daily  . Sodium Chloride (Inhalant)  1 Dose Nebulization QID  . tobramycin (PF)  300 mg Nebulization BID  . vitamin C  1,000 mg Oral BID    Continuous Infusions: . piperacillin-tazobactam (ZOSYN)  IV 3.375 g (10/15/17 1330)     Time spent: 6mins  I have personally reviewed and interpreted on  10/15/2017 daily labs,  imagings as discussed  above under date review session and assessment and plans.  I reviewed all nursing notes, pharmacy notes, consultant notes,  vitals, pertinent old records  I have discussed plan of care as described above with RN , patient and family on 10/15/2017   Florencia Reasons MD, PhD  [facility] Pager [phone]. If 7PM-7AM, please contact night-coverage at [email or web address], password [facility] 10/15/2017, 2:20 PM  LOS: 1 day

## 2017-10-15 NOTE — Unmapped (Signed)
Bronchiectasis Severity Index Score:    Age 63-69 = 2   BMI 18.5 or higher = 0   FEV1 % predicted 30-49% = 2   Hospitalized for severe exacerbation in past 2 years Yes = 5   Exacerbations in previous year 3 or more = 2   mMRC dyspnea score 1 = I get short of breath when hurrying on level ground or walking up a slight hill.   Chronic Pseudomonas colonization (at least 2 positive cultures at least 3 months apart within 1 year) Yes = 3   Colonization with other potential pathogenic bacteria (at least 2 positive cultures at least 3 months apart within 1 year) No = 0   Radiologic Severity 3 or more lobes or cystic brx = 1       Total Score 16     BSI score of 9+ = Severe bronchiectasis (1 year outcomes 7.6 - 10.5% mortality, 16.7 - 52.6% hospitalization rate; 4 year outcomes 9.9 - 29.2% mortality, 41.2 - 80.4% hospitalization)    [name] et al. The Bronchiectasis Severity Index: An International Derivation and Validation Study. AJRCCM, 2013; 189(5): C339114.)

## 2017-10-16 LAB — CBC WITH DIFFERENTIAL/PLATELET
Abs Immature Granulocytes: 0 10*3/uL (ref 0.0–0.1)
Basophils Absolute: 0 10*3/uL (ref 0.0–0.1)
Basophils Relative: 1 %
Eosinophils Absolute: 0.2 10*3/uL (ref 0.0–0.7)
Eosinophils Relative: 4 %
HCT: 41.7 % (ref 39.0–52.0)
Hemoglobin: 13.7 g/dL (ref 13.0–17.0)
Immature Granulocytes: 1 %
Lymphocytes Relative: 24 %
Lymphs Abs: 1 10*3/uL (ref 0.7–4.0)
MCH: 29.8 pg (ref 26.0–34.0)
MCHC: 32.9 g/dL (ref 30.0–36.0)
MCV: 90.7 fL (ref 78.0–100.0)
Monocytes Absolute: 0.7 10*3/uL (ref 0.1–1.0)
Monocytes Relative: 18 %
Neutro Abs: 2.1 10*3/uL (ref 1.7–7.7)
Neutrophils Relative %: 52 %
Platelets: 195 10*3/uL (ref 150–400)
RBC: 4.6 MIL/uL (ref 4.22–5.81)
RDW: 12.3 % (ref 11.5–15.5)
WBC: 3.9 10*3/uL — ABNORMAL LOW (ref 4.0–10.5)

## 2017-10-16 LAB — COMPREHENSIVE METABOLIC PANEL
ALT: 42 U/L (ref 0–44)
AST: 19 U/L (ref 15–41)
Albumin: 2.8 g/dL — ABNORMAL LOW (ref 3.5–5.0)
Alkaline Phosphatase: 108 U/L (ref 38–126)
Anion gap: 9 (ref 5–15)
BUN: 23 mg/dL (ref 8–23)
CO2: 24 mmol/L (ref 22–32)
Calcium: 8.7 mg/dL — ABNORMAL LOW (ref 8.9–10.3)
Chloride: 108 mmol/L (ref 98–111)
Creatinine, Ser: 1.17 mg/dL (ref 0.61–1.24)
GFR calc Af Amer: >60 mL/min (ref >60)
GFR calc non Af Amer: >60 mL/min (ref >60)
Glucose, Bld: 90 mg/dL (ref 70–99)
Potassium: 3.4 mmol/L — ABNORMAL LOW (ref 3.5–5.1)
Sodium: 141 mmol/L (ref 135–145)
Total Bilirubin: 0.5 mg/dL (ref 0.3–1.2)
Total Protein: 6.2 g/dL — ABNORMAL LOW (ref 6.5–8.1)

## 2017-10-16 LAB — AEROBIC CULTURE W GRAM STAIN (SUPERFICIAL SPECIMEN): Culture: NORMAL

## 2017-10-16 LAB — AEROBIC CULTURE  (SUPERFICIAL SPECIMEN)

## 2017-10-16 MED ORDER — IRON 325 (65 FE) MG PO TABS: 325.0000 mg | ORAL_TABLET | Freq: Every day | ORAL | 0 refills | Status: DC

## 2017-10-16 NOTE — Discharge Summary (Signed)
Discharge Summary  [name] HYW:[id number] DOB: 05/05/1955  PCP: Aura Dials, MD  Admit date: 10/13/2017 Discharge date: 10/16/2017  Time spent: 110mns  Recommendations for Outpatient Follow-up:  1. F/u with PMD within a week  for hospital discharge follow up, repeat cbc/bmp at follow up 2. F/u with general surgery  Discharge Diagnoses:  Active Hospital Problems   Diagnosis Date Noted  . Perirectal abscess 10/14/2017  . Sepsis ([facility] 10/14/2017  . Malignant ne[place] of prostate ([facility] 09/09/2017  . Bronchiectasis ([facility] 11/06/2010    Resolved Hospital Problems  No resolved problems to display.    Discharge Condition: stable  Diet recommendation: regular diet  Filed Weights   10/14/17 0215  Weight: 83.6 kg (184 lb 4.9 oz)    History of present illness: (per admitting MD Dr JBaron Hamper PCP: NGavin Pound MD  Patient coming from: Home  I have personally briefly reviewed patient's old medical [place] Chief Complaint: rigors  HPI: Nathan WRINKLEis a 62y.o. male with medical history significant of bronchiectasis prostate cancer presents with Reiger's.  Today patient had a perirectal abscess I&D and no vomiting outpatient office.  Shortly after he arrived home he started having Reiger's and nausea.  When he came here to the ED he was found to be febrile.  He had a white count of 19,000.  CT abdomen and pelvis with contrast showed a small fluid collection consistent with abscess around the perirectal region on the right.  There was also some changes on his right lung which may be consistent with atypical infection.  Patient is not having any new respiratory complaints at this time.  He is status post partial lobectomy on the right due to bronchiectasis.  Of note patient is recent sinus pulmonologist stated he is stable as far  as his lungs function.  Currently has stage I prostate cancer and is scheduled for prostatectomy in September of this  year. ED Course: Patient was empirically treated with Zosyn and vancomycin in the ED and given some pain control.    Hospital Course:  Principal Problem:   Perirectal abscess Active Problems:   [place]asis ([facility]   [place]   Sepsis ([facility]    Perirectal abscess with sepsis on presentation -S/p I/D on 7/24 in the office, he developed fever after  I/D and left general surgery office, he presented to the ED -he has fever 101.1, sinus tachycardia into the 140's, tachypnea  22-27, acute hypoxia , o2 sats at 89 on room air on presentation, , he also has leukocytosis , wbc 19.4 on presentation --ct ab/pel on presentation "2.1 x 1.5 x 1.8 cm thick-walled air in fluid collection in the right posterior perianal region consistent with an abscess" -[facility] consulted who recommended conservative management with abx, sitz bath -blood culture no growth, wound culture in process -he received zosynx3 days in the hospital, he is much improved.  He wants to go home, he has prescription of cipro and flagy x4 days, he can finish that, he knows to follow up final culture result and he prefer to follow up with [facility].  -general surgery consulted  H/o bronchiectasis , with h/o pseudomonas pneumonia in the past Ct ab/pel" Postsurgical changes at the right lung base. Bronchiectasis and diffuse nodularity within the lingula and left base, consistent with respiratory infection, possible atypical pneumonia" [facility], he is given inhaled tobra and colistimethate by [facility]. Patient denies new respiratory  symptom  prostate cancer ,  Ct ab/pel on presentation "Small focal hypodensity at the base of the right prostate possibly due to cyst or post biopsy change . Slow growth of sclerotic lesion within the left posterior iliac bone since 2015." he is followed by urology Dr Manny  Code Status: full  Family Communication:  patient and wife   Disposition Plan: home    Consultants:  General surgery  Procedures:  none  Antibiotics:  As above    Discharge Exam: BP 115/73 (BP Location: Right Arm)   Pulse 73   Temp (!) 97.4 F (36.3 C) (Oral)   Resp 16   Ht 5' 8" (1.727 m)   Wt 83.6 kg (184 lb 4.9 oz)   SpO2 94%   BMI 28.02 kg/m     General:  NAD  Cardiovascular: RRR  Respiratory: CTABL  Abdomen: Soft/ND/NT, positive BS  Musculoskeletal: No Edema  Neuro: alert, oriented    Discharge Instructions You were cared for by a hospitalist during your hospital stay. If you have any questions about your discharge medications or the care you received while you were in the hospital after you are discharged, you can call the unit and asked to speak with the hospitalist on call if the hospitalist that took care of you is not available. Once you are discharged, your primary care physician will handle any further medical issues. Please note that NO REFILLS for any discharge medications will be authorized once you are discharged, as it is imperative that you return to your primary care physician (or establish a relationship with a primary care physician if you do not have one) for your aftercare needs so that they can reassess your need for medications and monitor your lab values.  Discharge Instructions    Diet general   Complete by:  As directed    Discharge instructions   Complete by:  As directed    Continue take cipro and flagyl x4 days, follow up wound culture result in mychart, follow up with general surgery.   Increase activity slowly   Complete by:  As directed      Allergies as of 10/16/2017      Reactions   Gentamicin Other (See Comments)   BALANCE ISSUES   Morphine And Related Nausea And Vomiting   Tobramycin Other (See Comments), Nausea And Vomiting   ototoxicity      Medication List    TAKE these medications   albuterol (2.5 MG/3ML) 0.083% nebulizer solution Commonly  known as:  PROVENTIL Inhale 2.5 mg into the lungs 4 (four) times daily.   albuterol 108 (90 Base) MCG/ACT inhaler Commonly known as:  PROVENTIL HFA;VENTOLIN HFA Inhale into the lungs every 6 (six) hours as needed for wheezing or shortness of breath.   B-12 COMPLIANCE INJECTION 1000 MCG/ML Kit Generic drug:  Cyanocobalamin Inject 1,000 mcg as directed once a week. Pt. Takes on wednesdays   BENEFIBER PO Take 2 Doses by mouth daily.   budesonide-formoterol 160-4.5 MCG/ACT inhaler Commonly known as:  SYMBICORT Inhale 2 puffs into the lungs 2 (two) times daily.   CIALIS 20 MG tablet Generic drug:  tadalafil Take 20 mg by mouth daily as needed for erectile dysfunction.   Colistimethate Sodium Powd Take 2 mg by nebulization daily. 28 days on, 28 days off. Mix with 4ml sterile water.   FLUTTER Devi Use several times daily as needed for congestion/thick mucus   HYDROcodone-acetaminophen 7.5-325 MG tablet Commonly known as:  NORCO Take 1 tablet by   mouth 3 (three) times daily.   HYDROcodone-homatropine 5-1.5 MG/5ML syrup Commonly known as:  HYCODAN Take 5 mLs by mouth every 6 (six) hours as needed for cough.   Iron 325 (65 Fe) MG Tabs Take 1 tablet (325 mg total) by mouth daily with breakfast. What changed:  when to take this   LINZESS 145 MCG Caps capsule Generic drug:  linaclotide Take 145 mcg by mouth daily before breakfast.   NON FORMULARY Use Acapella twice daily for airway clearance. Dx. 494.0. Mucous clearing device.   omeprazole 20 MG capsule Commonly known as:  PRILOSEC Take 20 mg by mouth 2 (two) times daily.   polyethylene glycol packet Commonly known as:  MIRALAX / GLYCOLAX Take 17 g by mouth daily as needed.   PULMOZYME 1 MG/ML nebulizer solution Generic drug:  dornase alpha Take 2.5 mg by nebulization daily.   Sodium Chloride (Inhalant) 7 % Nebu Take 1 Dose by nebulization 4 (four) times daily.   sterile water for irrigation 4 mLs daily. Mix 4ml with  colistimethate powder to use for nebulization.   tobramycin (PF) 300 MG/5ML nebulizer solution Commonly known as:  [name] Take 300 mg by nebulization 2 (two) times daily. 28 days on. 28 days off   vitamin C 1000 MG tablet Take 1,000 mg by mouth 2 (two) times daily.      Allergies  Allergen Reactions  . Gentamicin Other (See Comments)    BALANCE ISSUES  . Morphine And Related Nausea And Vomiting  . Tobramycin Other (See Comments) and Nausea And Vomiting    ototoxicity   Follow-up Information    Hoxworth, Benjamin, MD Follow up.   Specialty:  General Surgery Why:  for perirectal abscess  Contact information: [address] [place] [address] [phone]        Thacker, Robert, MD Follow up in 1 week(s).   Specialty:  Family Medicine Why:  hospital discharge follow up, repeat cbc/bmp at follow up. Contact information: [address] [place] [address] [phone]            The results of significant diagnostics from this hospitalization (including imaging, microbiology, ancillary and laboratory) are listed below for reference.    Significant Diagnostic Studies: Dg Chest 2 View  Result Date: 10/13/2017 CLINICAL DATA:  Fever, dyspnea on exertion. EXAM: CHEST - 2 VIEW COMPARISON:  CT scan of March 31, 2016. Radiographs of March 24, 2015. FINDINGS: Stable cardiomediastinal silhouette. No pneumothorax is noted. Stable right apical scarring and postsurgical changes are noted. Stable probable loculated right pleural effusion is noted with associated right basilar scarring. Left lung is hyperexpanded with mediastinal shift to the right consistent with volume loss. This is unchanged. Bony thorax is unremarkable. IMPRESSION: Stable scarring and postsurgical changes are seen in the right hemithorax. Stable mild probable loculated right pleural effusion is noted. No acute abnormality is noted. Electronically Signed   By: James  Green Jr, M.D.   On: 10/13/2017  21:32   Ct Abdomen Pelvis W Contrast  Result Date: 10/13/2017 CLINICAL DATA:  Sepsis recent diagnosis of prostate cancer, I and D of right perirectal abscess EXAM: CT ABDOMEN AND PELVIS WITH CONTRAST TECHNIQUE: Multidetector CT imaging of the abdomen and pelvis was performed using the standard protocol following bolus administration of intravenous contrast. CONTRAST:  100mL OMNIPAQUE IOHEXOL 300 MG/ML  SOLN COMPARISON:  CT 04/20/2013 FINDINGS: Lower chest: Lung bases demonstrate volume loss on the right. Bronchiectasis in the right lower lobe with postsurgical changes   at the right base. There is pleural and parenchymal scarring and pleural calcification. Mild bronchiectasis in the lingula and left lung base. Scattered tiny nodules throughout the lingula and left lung base. Hepatobiliary: No focal liver abnormality is seen. No gallstones, gallbladder wall thickening, or biliary dilatation. Pancreas: Unremarkable. No pancreatic ductal dilatation or surrounding inflammatory changes. Spleen: Normal in size without focal abnormality. Adrenals/Urinary Tract: Punctate stone upper pole right kidney. Adrenal glands are normal. No hydronephrosis. The bladder is normal Stomach/Bowel: Stomach is within normal limits. Appendix not well seen, consistent with history of appendectomy. No evidence of bowel wall thickening, distention, or inflammatory changes. Vascular/Lymphatic: Nonaneurysmal aorta. No significantly enlarged lymph nodes. Minimal aortic atherosclerosis. Reproductive: No significant enlargement. Small focal hypodensity at the right base of the prostate. Other: Thick-walled air in fluid collection in the right posterior perianal region measuring 2.1 x 1.5 by 1.8 cm consistent with abscess. Moderate surrounding edema. Musculoskeletal: No acute osseous abnormality. Degenerative changes of the spine. Slight interval increase in size of a focal sclerotic lesion in the left posterior iliac bone since 2015. it measures 9  mm in size, compared with 4 mm previously. IMPRESSION: 1. 2.1 x 1.5 x 1.8 cm thick-walled air in fluid collection in the right posterior perianal region consistent with an abscess. 2. Small focal hypodensity at the base of the right prostate possibly due to cyst or post biopsy change. 3. Postsurgical changes at the right lung base. Bronchiectasis and diffuse nodularity within the lingula and left base, consistent with respiratory infection, possible atypical pneumonia. 4. Slow growth of sclerotic lesion within the left posterior iliac bone since 2015. Electronically Signed   By: Kim  Fujinaga M.D.   On: 10/13/2017 23:21    Microbiology: Recent Results (from the past 240 hour(s))  Culture, blood (Routine x 2)     Status: None (Preliminary result)   Collection Time: 10/13/17  8:30 PM  Result Value Ref Range Status   Specimen Description BLOOD LEFT ARM  Final   Special Requests   Final    BOTTLES DRAWN AEROBIC AND ANAEROBIC Blood Culture results may not be optimal due to an excessive volume of blood received in culture bottles   Culture   Final    NO GROWTH 2 DAYS Performed at [facility], [address] [place], [place] [address]    Report Status PENDING  Incomplete  Culture, blood (Routine x 2)     Status: None (Preliminary result)   Collection Time: 10/13/17  8:50 PM  Result Value Ref Range Status   Specimen Description BLOOD LEFT HAND  Final   Special Requests   Final    BOTTLES DRAWN AEROBIC ONLY Blood Culture results may not be optimal due to an excessive volume of blood received in culture bottles   Culture   Final    NO GROWTH 2 DAYS Performed at [facility], [address] [place],  [address]    Report Status PENDING  Incomplete  Aerobic Culture (superficial specimen)     Status: None (Preliminary result)   Collection Time: 10/14/17 11:31 AM  Result Value Ref Range Status   Specimen Description PERIRECTAL  Final   Special Requests NONE  Final   Gram Stain    Final    RARE WBC PRESENT, PREDOMINANTLY PMN RARE GRAM POSITIVE RODS RARE GRAM POSITIVE COCCI    Culture   Final    CULTURE REINCUBATED FOR BETTER GROWTH Performed at [facility], [address] [place],   [place] [address]    Report Status PENDING  Incomplete     Labs: Basic Metabolic Panel: Recent Labs  Lab 10/13/17 2055 10/14/17 0110 10/15/17 0517 10/16/17 0308  NA 137  --  141 141  K 4.0  --  3.8 3.4*  CL 105  --  106 108  CO2 18*  --  25 24  GLUCOSE 100*  --  90 90  BUN 29*  --  23 23  CREATININE 1.09 1.20 1.20 1.17  CALCIUM 9.4  --  8.9 8.7*   Liver Function Tests: Recent Labs  Lab 10/13/17 2055 10/16/17 0308  AST 25 19  ALT 36 42  ALKPHOS 135* 108  BILITOT 1.0 0.5  PROT 8.0 6.2*  ALBUMIN 3.7 2.8*   No results for input(s): LIPASE, AMYLASE in the last 168 hours. No results for input(s): AMMONIA in the last 168 hours. CBC: Recent Labs  Lab 10/13/17 2055 10/14/17 0110 10/14/17 0539 10/15/17 0517 10/16/17 0308  WBC 19.4* 15.5* 9.1 3.2* 3.9*  NEUTROABS 18.1*  --   --   --  2.1  HGB 17.2* 14.9 13.9 14.3 13.7  HCT 51.6 45.0 42.7 43.7 41.7  MCV 89.9 90.5 91.8 91.6 90.7  PLT 292 237 181 185 195   Cardiac Enzymes: No results for input(s): CKTOTAL, CKMB, CKMBINDEX, TROPONINI in the last 168 hours. BNP: BNP (last 3 results) No results for input(s): BNP in the last 8760 hours.  ProBNP (last 3 results) No results for input(s): PROBNP in the last 8760 hours.  CBG: No results for input(s): GLUCAP in the last 168 hours.     Signed:  Fang Xu MD, PhD  [facility] 10/16/2017, 10:35 AM    

## 2017-10-16 NOTE — Progress Notes (Signed)
Nsg Discharge Note  Admit Date:  10/13/2017 Discharge date: 10/16/2017   Benedict Needy to be D/C'd Home per MD order.  AVS completed.  Copy for chart, and copy for patient signed, and dated. Patient/caregiver able to verbalize understanding.  Discharge Medication: Allergies as of 10/16/2017      Reactions   Gentamicin Other (See Comments)   BALANCE ISSUES   Morphine And Related Nausea And Vomiting   Tobramycin Other (See Comments), Nausea And Vomiting   ototoxicity      Medication List    TAKE these medications   albuterol (2.5 MG/3ML) 0.083% nebulizer solution Commonly known as:  PROVENTIL Inhale 2.5 mg into the lungs 4 (four) times daily.   albuterol 108 (90 Base) MCG/ACT inhaler Commonly known as:  PROVENTIL HFA;VENTOLIN HFA Inhale into the lungs every 6 (six) hours as needed for wheezing or shortness of breath.   B-12 COMPLIANCE INJECTION 1000 MCG/ML Kit Generic drug:  Cyanocobalamin Inject 1,000 mcg as directed once a week. Pt. Takes on wednesdays   BENEFIBER PO Take 2 Doses by mouth daily.   budesonide-formoterol 160-4.5 MCG/ACT inhaler Commonly known as:  SYMBICORT Inhale 2 puffs into the lungs 2 (two) times daily.   CIALIS 20 MG tablet Generic drug:  tadalafil Take 20 mg by mouth daily as needed for erectile dysfunction.   Colistimethate Sodium Powd Take 2 mg by nebulization daily. 28 days on, 28 days off. Mix with 69m sterile water.   FLUTTER Devi Use several times daily as needed for congestion/thick mucus   HYDROcodone-acetaminophen 7.5-325 MG tablet Commonly known as:  NORCO Take 1 tablet by mouth 3 (three) times daily.   HYDROcodone-homatropine 5-1.5 MG/5ML syrup Commonly known as:  HYCODAN Take 5 mLs by mouth every 6 (six) hours as needed for cough.   Iron 325 (65 Fe) MG Tabs Take 1 tablet (325 mg total) by mouth daily with breakfast. What changed:  when to take this   LINZESS 145 MCG Caps capsule Generic drug:  linaclotide Take 145 mcg by  mouth daily before breakfast.   NON FORMULARY Use Acapella twice daily for airway clearance. Dx. 494.0. Mucous clearing device.   omeprazole 20 MG capsule Commonly known as:  PRILOSEC Take 20 mg by mouth 2 (two) times daily.   polyethylene glycol packet Commonly known as:  MIRALAX / GLYCOLAX Take 17 g by mouth daily as needed.   PULMOZYME 1 MG/ML nebulizer solution Generic drug:  dornase alpha Take 2.5 mg by nebulization daily.   Sodium Chloride (Inhalant) 7 % Nebu Take 1 Dose by nebulization 4 (four) times daily.   sterile water for irrigation 4 mLs daily. Mix 436mwith colistimethate powder to use for nebulization.   tobramycin (PF) 300 MG/5ML nebulizer solution Commonly known as:  [name] Take 300 mg by nebulization 2 (two) times daily. 28 days on. 28 days off   vitamin C 1000 MG tablet Take 1,000 mg by mouth 2 (two) times daily.       Discharge Assessment:  Skin clean, dry and intact without evidence of skin break down, no evidence of skin tears noted. IV catheter discontinued intact. Site without signs and symptoms of complications - no redness or edema noted at insertion site, patient denies c/o pain - only slight tenderness at site.  Dressing with slight pressure applied.  D/c Instructions-Education: Discharge instructions given to patient/family with verbalized understanding. D/c education completed with patient/family including follow up instructions, medication list, d/c activities limitations if indicated, with other d/c instructions as  indicated by MD - patient able to verbalize understanding, all questions fully answered. Patient instructed to return to ED, call 911, or call MD for any changes in condition.  Patient escorted via [place], and D/C home via private auto.  Hassan Rowan, RN 10/16/2017 11:53 AM

## 2017-10-18 LAB — CULTURE, BLOOD (ROUTINE X 2)
Culture: NO GROWTH
Culture: NO GROWTH

## 2017-10-20 DIAGNOSIS — D7589 Other specified diseases of blood and blood-forming organs: Secondary | ICD-10-CM | POA: Diagnosis not present

## 2017-10-20 DIAGNOSIS — K611 Rectal abscess: Secondary | ICD-10-CM | POA: Diagnosis not present

## 2017-10-27 DIAGNOSIS — D7589 Other specified diseases of blood and blood-forming organs: Secondary | ICD-10-CM | POA: Diagnosis not present

## 2017-10-28 DIAGNOSIS — K611 Rectal abscess: Secondary | ICD-10-CM | POA: Diagnosis not present

## 2017-11-03 DIAGNOSIS — D7589 Other specified diseases of blood and blood-forming organs: Secondary | ICD-10-CM | POA: Diagnosis not present

## 2017-11-09 MED ORDER — CIPROFLOXACIN 750 MG TABLET
ORAL_TABLET | Freq: Two times a day (BID) | ORAL | 0 refills | 0 days | Status: CP
Start: 2017-11-09 — End: 2017-12-05

## 2017-11-10 DIAGNOSIS — K611 Rectal abscess: Secondary | ICD-10-CM | POA: Diagnosis not present

## 2017-11-10 DIAGNOSIS — R197 Diarrhea, unspecified: Secondary | ICD-10-CM | POA: Diagnosis not present

## 2017-11-10 DIAGNOSIS — E538 Deficiency of other specified B group vitamins: Secondary | ICD-10-CM | POA: Diagnosis not present

## 2017-11-10 DIAGNOSIS — E440 Moderate protein-calorie malnutrition: Secondary | ICD-10-CM | POA: Diagnosis not present

## 2017-11-11 DIAGNOSIS — R197 Diarrhea, unspecified: Secondary | ICD-10-CM | POA: Diagnosis not present

## 2017-11-11 DIAGNOSIS — M62838 Other muscle spasm: Secondary | ICD-10-CM | POA: Diagnosis not present

## 2017-11-11 DIAGNOSIS — M6281 Muscle weakness (generalized): Secondary | ICD-10-CM | POA: Diagnosis not present

## 2017-11-15 DIAGNOSIS — R197 Diarrhea, unspecified: Secondary | ICD-10-CM | POA: Diagnosis not present

## 2017-11-17 DIAGNOSIS — D7589 Other specified diseases of blood and blood-forming organs: Secondary | ICD-10-CM | POA: Diagnosis not present

## 2017-11-24 DIAGNOSIS — D7589 Other specified diseases of blood and blood-forming organs: Secondary | ICD-10-CM | POA: Diagnosis not present

## 2017-11-26 DIAGNOSIS — C61 Malignant neoplasm of prostate: Secondary | ICD-10-CM | POA: Diagnosis not present

## 2017-11-26 DIAGNOSIS — M62838 Other muscle spasm: Secondary | ICD-10-CM | POA: Diagnosis not present

## 2017-11-26 DIAGNOSIS — M6281 Muscle weakness (generalized): Secondary | ICD-10-CM | POA: Diagnosis not present

## 2017-11-30 DIAGNOSIS — J479 Bronchiectasis, uncomplicated: Secondary | ICD-10-CM | POA: Diagnosis not present

## 2017-11-30 DIAGNOSIS — C61 Malignant neoplasm of prostate: Secondary | ICD-10-CM | POA: Diagnosis not present

## 2017-12-01 DIAGNOSIS — R194 Change in bowel habit: Secondary | ICD-10-CM | POA: Diagnosis not present

## 2017-12-01 DIAGNOSIS — R197 Diarrhea, unspecified: Secondary | ICD-10-CM | POA: Diagnosis not present

## 2017-12-01 DIAGNOSIS — R933 Abnormal findings on diagnostic imaging of other parts of digestive tract: Secondary | ICD-10-CM | POA: Diagnosis not present

## 2017-12-01 DIAGNOSIS — Z86010 Personal history of colonic polyps: Secondary | ICD-10-CM | POA: Diagnosis not present

## 2017-12-01 NOTE — Patient Instructions (Addendum)
[name]  12/01/2017   Your procedure is scheduled on: 12-08-17  Report to [facility] Main  Entrance  Report to admitting at 930 AM    Call this number if you have problems the morning of surgery [phone]   Remember: Do not eat food After Midnight.MONDAY NIGHT, FOLLOW ALL BOWEL PREP INSTRUCTIONS FOR BOWEL PREP Tuesday 12-07-17 FROM DR [facility].      CLEAR LIQUID DIET   Foods Allowed                                                                     Foods Excluded  Coffee and tea, regular and decaf                             liquids that you cannot  Plain Jell-O in any flavor                                             see through such as: Fruit ices (not with fruit pulp)                                     milk, soups, orange juice  Iced Popsicles                                    All solid food Carbonated beverages, regular and diet                                    Cranberry, grape and apple juices Sports drinks like Gatorade Lightly seasoned clear broth or consume(fat free) Sugar, honey syrup  Sample Menu Breakfast                                Lunch                                     Supper Cranberry juice                    Beef broth                            Chicken broth Jell-O                                     Grape juice                           Apple juice Coffee or tea  Jell-O                                      Popsicle                                                Coffee or tea                        Coffee or tea  _____________________________________________________________________  [facility] - Preparing for Surgery Before surgery, you can play an important role.  Because skin is not sterile, your skin needs to be as free of germs as possible.  You can reduce the number of germs on your skin by washing with CHG (chlorahexidine gluconate) soap before surgery.  CHG is an antiseptic cleaner which  kills germs and bonds with the skin to continue killing germs even after washing. Please DO NOT use if you have an allergy to CHG or antibacterial soaps.  If your skin becomes reddened/irritated stop using the CHG and inform your nurse when you arrive at Short Stay. Do not shave (including legs and underarms) for at least 48 hours prior to the first CHG shower.  You may shave your face/neck. Please follow these instructions carefully:  1.  Shower with CHG Soap the night before surgery and the  morning of Surgery.  2.  If you choose to wash your hair, wash your hair first as usual with your  normal  shampoo.  3.  After you shampoo, rinse your hair and body thoroughly to remove the  shampoo.                           4.  Use CHG as you would any other liquid soap.  You can apply chg directly  to the skin and wash                       Gently with a scrungie or clean washcloth.  5.  Apply the CHG Soap to your body ONLY FROM THE NECK DOWN.   Do not use on face/ open                           Wound or open sores. Avoid contact with eyes, ears mouth and genitals (private parts).                       Wash face,  Genitals (private parts) with your normal soap.             6.  Wash thoroughly, paying special attention to the area where your surgery  will be performed.  7.  Thoroughly rinse your body with warm water from the neck down.  8.  DO NOT shower/wash with your normal soap after using and rinsing off  the CHG Soap.                9.  Pat yourself dry with a clean towel.            10.  Wear clean pajamas.            11.  Place clean  sheets on your bed the night of your first shower and do not  sleep with pets. Day of Surgery : Do not apply any lotions/deodorants the morning of surgery.  Please wear clean clothes to the hospital/surgery center.  FAILURE TO FOLLOW THESE INSTRUCTIONS MAY RESULT IN THE CANCELLATION OF YOUR SURGERY PATIENT SIGNATURE_________________________________  NURSE  SIGNATURE__________________________________  ________________________________________________________________________    Take these medicines the morning of surgery with A SIP OF WATER: TOBRAMYCIN NUBULIZER, ALBUTEROL (PROVENTIL ) INHALER, ALBUTEROL RESCUE INHALER IF NEEDED AND BRING INHALER, SYMBICORT, DORNASE ALPHA NEBULIZER, HYDROCODONE IF NEEDED, AZITHROMYCIN (ZITHROMAX), OMEPRAZOLE (PRILOSEC), APELLA AIRWAY CLEARING DEVICE                               You may not have any metal on your body including hair pins and              piercings  Do not wear jewelry, make-up, lotions, powders or perfumes, deodorant             Do not wear nail polish.  Do not shave  48 hours prior to surgery.              Men may shave face and neck.   Do not bring valuables to the hospital. [place].  Contacts, dentures or bridgework may not be worn into surgery.  Leave suitcase in the car. After surgery it may be brought to your room.                  Please read over the following fact sheets you were given: _____________________________________________________________________

## 2017-12-02 ENCOUNTER — Encounter (HOSPITAL_COMMUNITY): Payer: Self-pay

## 2017-12-02 ENCOUNTER — Encounter (HOSPITAL_COMMUNITY)
Admission: RE | Admit: 2017-12-02 | Discharge: 2017-12-02 | Disposition: A | Discharge status: Home / Self Care | Payer: 59 | Source: Ambulatory Visit | Attending: General Surgery | Admitting: General Surgery

## 2017-12-02 ENCOUNTER — Other Ambulatory Visit: Payer: Self-pay

## 2017-12-02 DIAGNOSIS — Z01812 Encounter for preprocedural laboratory examination: Secondary | ICD-10-CM | POA: Diagnosis not present

## 2017-12-02 DIAGNOSIS — R9431 Abnormal electrocardiogram [ECG] [EKG]: Secondary | ICD-10-CM | POA: Insufficient documentation

## 2017-12-02 DIAGNOSIS — Z01810 Encounter for preprocedural cardiovascular examination: Secondary | ICD-10-CM | POA: Diagnosis present

## 2017-12-02 DIAGNOSIS — C61 Malignant neoplasm of prostate: Secondary | ICD-10-CM | POA: Diagnosis not present

## 2017-12-02 HISTORY — DX: Personal history of urinary calculi: Z87.442

## 2017-12-02 HISTORY — DX: Other specified postprocedural states: Z98.890

## 2017-12-02 HISTORY — DX: Nausea with vomiting, unspecified: R11.2

## 2017-12-02 HISTORY — DX: Dyspnea, unspecified: R06.00

## 2017-12-02 LAB — CBC
HCT: 42.6 % (ref 39.0–52.0)
Hemoglobin: 14.3 g/dL (ref 13.0–17.0)
MCH: 31.1 pg (ref 26.0–34.0)
MCHC: 33.6 g/dL (ref 30.0–36.0)
MCV: 92.6 fL (ref 78.0–100.0)
Platelets: 209 10*3/uL (ref 150–400)
RBC: 4.6 MIL/uL (ref 4.22–5.81)
RDW: 13.4 % (ref 11.5–15.5)
WBC: 5.9 10*3/uL (ref 4.0–10.5)

## 2017-12-02 NOTE — Progress Notes (Signed)
Left voicemail message with [name] requesting pulmonary clearance note.

## 2017-12-02 NOTE — Progress Notes (Signed)
LOV DR Eulis Canner [facility] 10-12-17 [facility] PULMONARY FUNCTION TEST [facility]

## 2017-12-02 NOTE — Unmapped (Signed)
[facility] Refill Coordination Note  Specialty Medication(s): COLISTIN 150MG  (NEW rx REQ)  Additional Medications shipped: SYRINGE,NEB CUP, ALCOHOL SWAB, STERILE WATER    Jordan [name], DOB: 04-23-1955  Phone: [phone] (home) , Alternate phone contact: N/A  Phone or address changes today?: No  All above HIPAA information was verified with patient.  Shipping Address: [address]  [place] [address]   Insurance changes? No    Completed refill call assessment today to schedule patient's medication shipment from the [facility] [phone]).      Confirmed the medication and dosage are correct and have not changed: Yes, regimen is correct and unchanged.    Confirmed patient started or stopped the following medications in the past month:  No, there are no changes reported at this time.    Are you tolerating your medication?:  Jordan [name] reports tolerating the medication.    ADHERENCE        Did you miss any doses in the past 4 weeks? No missed doses reported.    FINANCIAL/SHIPPING    Delivery Scheduled: Yes, Expected medication delivery date: 12/14/17.  However, Rx request for refills was sent to the provider as there are none remaining.     CF [employer] Active? N/A-not CF patient    The patient will receive a drug information handout for each medication shipped and additional FDA Medication Guides as required.      Jordan [name] did not have any additional questions at this time.    Delivery address validated in Epic.    We will follow up with patient monthly for standard refill processing and delivery.      Thank you,  Julianne Rice   [facility] Specialty Pharmacist

## 2017-12-05 MED ORDER — ALCOHOL SWABS
5 refills | 0 days | Status: CP
Start: 2017-12-05 — End: 2018-07-06
  Filled 2017-12-13: qty 100, 90d supply, fill #0

## 2017-12-05 MED ORDER — SYRINGE WITH NEEDLE 3 ML 20 GAUGE X 1 1/2"
5 refills | 0 days | Status: CP
Start: 2017-12-05 — End: 2018-07-06
  Filled 2017-12-13: qty 60, 30d supply, fill #0

## 2017-12-05 MED ORDER — COLISTIN (COLISTIMETHATE SODIUM) 150 MG SOLUTION FOR INJECTION
5 refills | 0 days | Status: CP
Start: 2017-12-05 — End: 2018-07-06
  Filled 2017-12-13: qty 60, 30d supply, fill #0

## 2017-12-05 MED ORDER — LC PLUS MISC
11 refills | 0 days | Status: CP
Start: 2017-12-05 — End: 2018-07-06

## 2017-12-05 MED ORDER — WATER FOR INJECTION, STERILE INJECTION SOLUTION
5 refills | 0 days | Status: CP
Start: 2017-12-05 — End: 2018-07-06
  Filled 2017-12-13: qty 300, 30d supply, fill #0

## 2017-12-05 NOTE — Unmapped (Signed)
[facility]ct[facility] Pharmacist Note: Medication Management/Renewals     Sent medication orders for Jordan [name].    Pseudomonas aeruginosa infection  Bronchiectasis without complication (CMS-HCC)  - colistimethate (COLYMYCIN) 150 mg injection; INJECT 2 ML STERILE WATER FOR INJECTION INTO COLISTIN VIAL THEN WITHDRAW 2 ML  Dispense: 60 each; Refill: 5  - syringe with needle (SYRINGE 3CC/20GX1) 3 mL 20 gauge x 1 Syrg; For use with inhaled antibiotic  Dispense: 60 Syringe; Refill: 5  - sterile water, PF, Soln; USE TO MIX COLISTIN THEN ADD ADDITIONAL OF STERILE WATER FOR INJECTION TO NEBULIZER CUP WITH OF MIXED COLISTIN & NEBULIZE TWICE DAILY  Dispense: 60 ampule; Refill: 5  - LC PLUS Misc; Provide 1 device  for use with inhaled medication.  Dispense: 1 each; Refill: 11  - alcohol swabs PadM; USE AS DIRECTED WITH INHALED ANTIBIOTICS  Dispense: 100 each; Refill: 5    Pharmacy sent to:  [facility]      Anell Barr, PharmD, BCPS, CPP  Clinical Pharmacist Practitioner  [facility]  [facility]  Pager: [phone]

## 2017-12-08 ENCOUNTER — Ambulatory Visit (HOSPITAL_COMMUNITY): Payer: 59 | Admitting: Certified Registered Nurse Anesthetist

## 2017-12-08 ENCOUNTER — Encounter (HOSPITAL_COMMUNITY): Payer: Self-pay | Admitting: *Deleted

## 2017-12-08 ENCOUNTER — Observation Stay (HOSPITAL_COMMUNITY)
Admission: RE | Admit: 2017-12-08 | Discharge: 2017-12-09 | Disposition: A | Discharge status: Home / Self Care | Payer: 59 | Source: Ambulatory Visit | Attending: Urology | Admitting: Urology

## 2017-12-08 ENCOUNTER — Other Ambulatory Visit: Payer: Self-pay

## 2017-12-08 ENCOUNTER — Encounter (HOSPITAL_COMMUNITY)
Admission: RE | Disposition: A | Discharge status: Home / Self Care | Payer: Self-pay | Source: Ambulatory Visit | Attending: Urology

## 2017-12-08 DIAGNOSIS — C61 Malignant neoplasm of prostate: Secondary | ICD-10-CM | POA: Diagnosis not present

## 2017-12-08 DIAGNOSIS — I10 Essential (primary) hypertension: Secondary | ICD-10-CM | POA: Diagnosis not present

## 2017-12-08 DIAGNOSIS — Z7951 Long term (current) use of inhaled steroids: Secondary | ICD-10-CM | POA: Diagnosis not present

## 2017-12-08 DIAGNOSIS — Z79899 Other long term (current) drug therapy: Secondary | ICD-10-CM | POA: Insufficient documentation

## 2017-12-08 DIAGNOSIS — K219 Gastro-esophageal reflux disease without esophagitis: Secondary | ICD-10-CM | POA: Diagnosis not present

## 2017-12-08 DIAGNOSIS — G4733 Obstructive sleep apnea (adult) (pediatric): Secondary | ICD-10-CM | POA: Diagnosis not present

## 2017-12-08 DIAGNOSIS — N35919 Unspecified urethral stricture, male, unspecified site: Secondary | ICD-10-CM | POA: Diagnosis not present

## 2017-12-08 DIAGNOSIS — Z23 Encounter for immunization: Secondary | ICD-10-CM | POA: Insufficient documentation

## 2017-12-08 HISTORY — PX: LYMPHADENECTOMY: SHX5960

## 2017-12-08 HISTORY — PX: ROBOT ASSISTED LAPAROSCOPIC RADICAL PROSTATECTOMY: SHX5141

## 2017-12-08 LAB — HEMOGLOBIN AND HEMATOCRIT, BLOOD
HCT: 42.4 % (ref 39.0–52.0)
Hemoglobin: 14.1 g/dL (ref 13.0–17.0)

## 2017-12-08 SURGERY — PROSTATECTOMY, RADICAL, ROBOT-ASSISTED, LAPAROSCOPIC
Anesthesia: General

## 2017-12-08 MED ORDER — FENTANYL CITRATE (PF) 100 MCG/2ML IJ SOLN
25.0000 ug | INTRAMUSCULAR | Status: DC | PRN
  Administered 2017-12-08: 25 ug via INTRAVENOUS

## 2017-12-08 MED ORDER — PROPOFOL 10 MG/ML IV BOLUS
INTRAVENOUS | Status: DC | PRN
  Administered 2017-12-08: 140 mg via INTRAVENOUS

## 2017-12-08 MED ORDER — HYDROMORPHONE HCL 1 MG/ML IJ SOLN
0.5000 mg | INTRAMUSCULAR | Status: DC | PRN
  Administered 2017-12-08 (×2): 1 mg via INTRAVENOUS
  Filled 2017-12-08 (×2): qty 1

## 2017-12-08 MED ORDER — ONDANSETRON HCL 4 MG/2ML IJ SOLN
INTRAMUSCULAR | Status: AC
  Filled 2017-12-08: qty 2

## 2017-12-08 MED ORDER — DIPHENHYDRAMINE HCL 12.5 MG/5ML PO ELIX: 12.5000 mg | ORAL_SOLUTION | Freq: Four times a day (QID) | ORAL | Status: DC | PRN

## 2017-12-08 MED ORDER — LACTATED RINGERS IR SOLN
Status: DC | PRN
  Administered 2017-12-08: 1

## 2017-12-08 MED ORDER — LIDOCAINE 2% (20 MG/ML) 5 ML SYRINGE
INTRAMUSCULAR | Status: AC
  Filled 2017-12-08: qty 5

## 2017-12-08 MED ORDER — HYDROMORPHONE HCL 1 MG/ML IJ SOLN
1.0000 mg | INTRAMUSCULAR | Status: DC | PRN
  Administered 2017-12-08 – 2017-12-09 (×3): 1 mg via INTRAVENOUS
  Filled 2017-12-08 (×3): qty 1

## 2017-12-08 MED ORDER — DEXTROSE-NACL 5-0.45 % IV SOLN
INTRAVENOUS | Status: DC
  Administered 2017-12-08 – 2017-12-09 (×2): via INTRAVENOUS

## 2017-12-08 MED ORDER — ACETAMINOPHEN 500 MG PO TABS
1000.0000 mg | ORAL_TABLET | Freq: Four times a day (QID) | ORAL | Status: AC
  Administered 2017-12-08 – 2017-12-09 (×4): 1000 mg via ORAL
  Filled 2017-12-08 (×4): qty 2

## 2017-12-08 MED ORDER — ROCURONIUM BROMIDE 10 MG/ML (PF) SYRINGE
PREFILLED_SYRINGE | INTRAVENOUS | Status: DC | PRN
  Administered 2017-12-08: 10 mg via INTRAVENOUS
  Administered 2017-12-08: 60 mg via INTRAVENOUS
  Administered 2017-12-08: 20 mg via INTRAVENOUS

## 2017-12-08 MED ORDER — TOBRAMYCIN 300 MG/5ML IN NEBU
300.0000 mg | INHALATION_SOLUTION | Freq: Two times a day (BID) | RESPIRATORY_TRACT | Status: DC
  Administered 2017-12-08 – 2017-12-09 (×2): 300 mg via RESPIRATORY_TRACT
  Filled 2017-12-08 (×2): qty 5

## 2017-12-08 MED ORDER — DIPHENHYDRAMINE HCL 50 MG/ML IJ SOLN: 12.5000 mg | Freq: Four times a day (QID) | INTRAMUSCULAR | Status: DC | PRN

## 2017-12-08 MED ORDER — HYDROCODONE-ACETAMINOPHEN 7.5-325 MG PO TABS: 1.0000 | ORAL_TABLET | Freq: Four times a day (QID) | ORAL | 0 refills | Status: AC | PRN

## 2017-12-08 MED ORDER — BUPIVACAINE LIPOSOME 1.3 % IJ SUSP
20.0000 mL | Freq: Once | INTRAMUSCULAR | Status: AC
  Administered 2017-12-08: 20 mL
  Filled 2017-12-08: qty 20

## 2017-12-08 MED ORDER — ONDANSETRON HCL 4 MG/2ML IJ SOLN
INTRAMUSCULAR | Status: DC | PRN
  Administered 2017-12-08: 4 mg via INTRAVENOUS

## 2017-12-08 MED ORDER — ALBUTEROL SULFATE (2.5 MG/3ML) 0.083% IN NEBU
2.5000 mg | INHALATION_SOLUTION | Freq: Four times a day (QID) | RESPIRATORY_TRACT | Status: DC
  Administered 2017-12-08 – 2017-12-09 (×3): 2.5 mg via RESPIRATORY_TRACT
  Filled 2017-12-08 (×2): qty 3

## 2017-12-08 MED ORDER — FENTANYL CITRATE (PF) 100 MCG/2ML IJ SOLN
INTRAMUSCULAR | Status: DC | PRN
  Administered 2017-12-08: 100 ug via INTRAVENOUS
  Administered 2017-12-08 (×5): 50 ug via INTRAVENOUS

## 2017-12-08 MED ORDER — OXYCODONE HCL 5 MG PO TABS
5.0000 mg | ORAL_TABLET | ORAL | Status: DC | PRN
  Administered 2017-12-08 – 2017-12-09 (×4): 5 mg via ORAL
  Filled 2017-12-08 (×4): qty 1

## 2017-12-08 MED ORDER — SODIUM CHLORIDE 0.9 % IV BOLUS
1000.0000 mL | Freq: Once | INTRAVENOUS | Status: AC
  Administered 2017-12-08: 1000 mL via INTRAVENOUS

## 2017-12-08 MED ORDER — FENTANYL CITRATE (PF) 250 MCG/5ML IJ SOLN
INTRAMUSCULAR | Status: AC
  Filled 2017-12-08: qty 5

## 2017-12-08 MED ORDER — ALBUTEROL SULFATE (2.5 MG/3ML) 0.083% IN NEBU: 3.0000 mL | INHALATION_SOLUTION | RESPIRATORY_TRACT | Status: DC | PRN

## 2017-12-08 MED ORDER — MAGNESIUM CITRATE PO SOLN: 1.0000 | Freq: Once | ORAL | Status: DC

## 2017-12-08 MED ORDER — STERILE WATER FOR IRRIGATION IR SOLN
Status: DC | PRN
  Administered 2017-12-08: 1000 mL

## 2017-12-08 MED ORDER — PROPOFOL 10 MG/ML IV BOLUS
INTRAVENOUS | Status: AC
  Filled 2017-12-08: qty 20

## 2017-12-08 MED ORDER — AZITHROMYCIN 250 MG PO TABS
250.0000 mg | ORAL_TABLET | Freq: Every day | ORAL | Status: DC
  Administered 2017-12-09: 250 mg via ORAL
  Filled 2017-12-08: qty 1

## 2017-12-08 MED ORDER — SODIUM CHLORIDE 7 % IN NEBU: 4.0000 mL | INHALATION_SOLUTION | Freq: Four times a day (QID) | RESPIRATORY_TRACT | Status: DC

## 2017-12-08 MED ORDER — DEXAMETHASONE SODIUM PHOSPHATE 4 MG/ML IJ SOLN
INTRAMUSCULAR | Status: DC | PRN
  Administered 2017-12-08: 10 mg via INTRAVENOUS

## 2017-12-08 MED ORDER — DORNASE ALFA 2.5 MG/2.5ML IN SOLN
2.5000 mg | Freq: Every day | RESPIRATORY_TRACT | Status: DC
  Administered 2017-12-09: 2.5 mg via RESPIRATORY_TRACT
  Filled 2017-12-08: qty 2.5

## 2017-12-08 MED ORDER — DEXAMETHASONE SODIUM PHOSPHATE 10 MG/ML IJ SOLN
INTRAMUSCULAR | Status: AC
  Filled 2017-12-08: qty 1

## 2017-12-08 MED ORDER — SUGAMMADEX SODIUM 200 MG/2ML IV SOLN
INTRAVENOUS | Status: DC | PRN
  Administered 2017-12-08: 200 mg via INTRAVENOUS

## 2017-12-08 MED ORDER — MOMETASONE FURO-FORMOTEROL FUM 200-5 MCG/ACT IN AERO
2.0000 | INHALATION_SPRAY | Freq: Two times a day (BID) | RESPIRATORY_TRACT | Status: DC
  Administered 2017-12-08 – 2017-12-09 (×2): 2 via RESPIRATORY_TRACT
  Filled 2017-12-08: qty 8.8

## 2017-12-08 MED ORDER — CLINDAMYCIN PHOSPHATE 900 MG/50ML IV SOLN
900.0000 mg | INTRAVENOUS | Status: AC
  Administered 2017-12-08: 900 mg via INTRAVENOUS
  Filled 2017-12-08: qty 50

## 2017-12-08 MED ORDER — GENTAMICIN SULFATE 40 MG/ML IJ SOLN: 5.0000 mg/kg | INTRAVENOUS | Status: DC

## 2017-12-08 MED ORDER — ONDANSETRON HCL 4 MG/2ML IJ SOLN: 4.0000 mg | INTRAMUSCULAR | Status: DC | PRN

## 2017-12-08 MED ORDER — PANTOPRAZOLE SODIUM 40 MG PO TBEC
40.0000 mg | DELAYED_RELEASE_TABLET | Freq: Every day | ORAL | Status: DC
  Administered 2017-12-09: 40 mg via ORAL
  Filled 2017-12-08: qty 1

## 2017-12-08 MED ORDER — SUGAMMADEX SODIUM 200 MG/2ML IV SOLN
INTRAVENOUS | Status: AC
  Filled 2017-12-08: qty 2

## 2017-12-08 MED ORDER — INFLUENZA VAC SPLIT QUAD 0.5 ML IM SUSY
0.5000 mL | PREFILLED_SYRINGE | INTRAMUSCULAR | Status: AC
  Administered 2017-12-09: 0.5 mL via INTRAMUSCULAR
  Filled 2017-12-08: qty 0.5

## 2017-12-08 MED ORDER — FENTANYL CITRATE (PF) 100 MCG/2ML IJ SOLN
INTRAMUSCULAR | Status: AC
  Administered 2017-12-08: 25 ug via INTRAVENOUS
  Filled 2017-12-08: qty 2

## 2017-12-08 MED ORDER — LACTATED RINGERS IV SOLN
INTRAVENOUS | Status: DC
  Administered 2017-12-08 (×2): via INTRAVENOUS

## 2017-12-08 MED ORDER — SODIUM CHLORIDE 0.9 % IJ SOLN
INTRAMUSCULAR | Status: AC
  Filled 2017-12-08: qty 20

## 2017-12-08 MED ORDER — LIDOCAINE 2% (20 MG/ML) 5 ML SYRINGE
INTRAMUSCULAR | Status: DC | PRN
  Administered 2017-12-08: 60 mg via INTRAVENOUS

## 2017-12-08 MED ORDER — MIDAZOLAM HCL 2 MG/2ML IJ SOLN
INTRAMUSCULAR | Status: AC
  Filled 2017-12-08: qty 2

## 2017-12-08 MED ORDER — FLUTTER DEVI: Freq: Every day | Status: DC | PRN

## 2017-12-08 MED ORDER — FENTANYL CITRATE (PF) 100 MCG/2ML IJ SOLN
INTRAMUSCULAR | Status: AC
  Filled 2017-12-08: qty 2

## 2017-12-08 MED ORDER — PHENYLEPHRINE 40 MCG/ML (10ML) SYRINGE FOR IV PUSH (FOR BLOOD PRESSURE SUPPORT)
PREFILLED_SYRINGE | INTRAVENOUS | Status: DC | PRN
  Administered 2017-12-08 (×3): 80 ug via INTRAVENOUS

## 2017-12-08 MED ORDER — CIPROFLOXACIN IN D5W 400 MG/200ML IV SOLN
400.0000 mg | Freq: Once | INTRAVENOUS | Status: AC
  Administered 2017-12-08: 400 mg via INTRAVENOUS
  Filled 2017-12-08: qty 200

## 2017-12-08 MED ORDER — ROCURONIUM BROMIDE 10 MG/ML (PF) SYRINGE
PREFILLED_SYRINGE | INTRAVENOUS | Status: AC
  Filled 2017-12-08: qty 10

## 2017-12-08 MED ORDER — MIDAZOLAM HCL 5 MG/5ML IJ SOLN
INTRAMUSCULAR | Status: DC | PRN
  Administered 2017-12-08: 2 mg via INTRAVENOUS

## 2017-12-08 MED ORDER — ZOLPIDEM TARTRATE 5 MG PO TABS
5.0000 mg | ORAL_TABLET | Freq: Every evening | ORAL | Status: AC | PRN
  Administered 2017-12-08: 5 mg via ORAL
  Filled 2017-12-08: qty 1

## 2017-12-08 MED ORDER — FLUTICASONE PROPIONATE 50 MCG/ACT NA SUSP
2.0000 | Freq: Every day | NASAL | Status: DC
  Administered 2017-12-09: 2 via NASAL
  Filled 2017-12-08: qty 16

## 2017-12-08 MED ORDER — SODIUM CHLORIDE 0.9 % IJ SOLN
INTRAMUSCULAR | Status: DC | PRN
  Administered 2017-12-08: 20 mL

## 2017-12-08 SURGICAL SUPPLY — 64 items
ADH SKN CLS APL DERMABOND .7 (GAUZE/BANDAGES/DRESSINGS) ×2
APL SWBSTK 6 STRL LF DISP (MISCELLANEOUS) ×2
APPLICATOR COTTON TIP 6 STRL (MISCELLANEOUS) ×2 IMPLANT
APPLICATOR COTTON TIP 6IN STRL (MISCELLANEOUS) ×3
CATH FOLEY 2WAY SLVR 18FR 30CC (CATHETERS) ×3 IMPLANT
CATH TIEMANN FOLEY 18FR 5CC (CATHETERS) ×3 IMPLANT
CHLORAPREP W/TINT 26ML (MISCELLANEOUS) ×3 IMPLANT
CLIP VESOLOCK LG 6/CT PURPLE (CLIP) ×8 IMPLANT
CLOTH BEACON ORANGE TIMEOUT ST (SAFETY) ×3 IMPLANT
CONT SPEC 4OZ CLIKSEAL STRL BL (MISCELLANEOUS) ×3 IMPLANT
COVER TIP SHEARS 8 DVNC (MISCELLANEOUS) ×2 IMPLANT
COVER TIP SHEARS 8MM DA VINCI (MISCELLANEOUS) ×2
CUTTER ECHEON FLEX ENDO 45 340 (ENDOMECHANICALS) ×3 IMPLANT
DECANTER SPIKE VIAL GLASS SM (MISCELLANEOUS) ×3 IMPLANT
DERMABOND ADVANCED (GAUZE/BANDAGES/DRESSINGS) ×1
DERMABOND ADVANCED .7 DNX12 (GAUZE/BANDAGES/DRESSINGS) ×2 IMPLANT
DRAPE ARM DVNC X/XI (DISPOSABLE) ×8 IMPLANT
DRAPE COLUMN DVNC XI (DISPOSABLE) ×2 IMPLANT
DRAPE DA VINCI XI ARM (DISPOSABLE) ×4
DRAPE DA VINCI XI COLUMN (DISPOSABLE) ×1
DRAPE SURG IRRIG POUCH 19X23 (DRAPES) ×3 IMPLANT
DRSG TEGADERM 4X4.75 (GAUZE/BANDAGES/DRESSINGS) ×3 IMPLANT
ELECT PENCIL ROCKER SW 15FT (MISCELLANEOUS) ×3 IMPLANT
ELECT REM PT RETURN 15FT ADLT (MISCELLANEOUS) ×3 IMPLANT
GAUZE SPONGE 2X2 8PLY STRL LF (GAUZE/BANDAGES/DRESSINGS) IMPLANT
GLOVE BIO SURGEON STRL SZ 6.5 (GLOVE) ×3 IMPLANT
GLOVE BIOGEL M STRL SZ7.5 (GLOVE) ×6 IMPLANT
GLOVE BIOGEL PI IND STRL 7.5 (GLOVE) ×2 IMPLANT
GLOVE BIOGEL PI INDICATOR 7.5 (GLOVE) ×1
GOWN STRL REUS W/TWL LRG LVL3 (GOWN DISPOSABLE) ×9 IMPLANT
HOLDER FOLEY CATH W/STRAP (MISCELLANEOUS) ×3 IMPLANT
IRRIG SUCT STRYKERFLOW 2 WTIP (MISCELLANEOUS) ×3
IRRIGATION SUCT STRKRFLW 2 WTP (MISCELLANEOUS) ×2 IMPLANT
IV LACTATED RINGERS 1000ML (IV SOLUTION) ×3 IMPLANT
KIT PROCEDURE DA VINCI SI (MISCELLANEOUS) ×1
KIT PROCEDURE DVNC SI (MISCELLANEOUS) ×2 IMPLANT
NDL INSUFFLATION 14GA 120MM (NEEDLE) ×2 IMPLANT
NDL SPNL 22GX7 QUINCKE BK (NEEDLE) ×2 IMPLANT
NEEDLE INSUFFLATION 14GA 120MM (NEEDLE) ×3 IMPLANT
NEEDLE SPNL 22GX7 QUINCKE BK (NEEDLE) ×3 IMPLANT
PACK ROBOT UROLOGY CUSTOM (CUSTOM PROCEDURE TRAY) ×3 IMPLANT
PAD POSITIONING PINK XL (MISCELLANEOUS) ×3 IMPLANT
PORT ACCESS TROCAR AIRSEAL 12 (TROCAR) ×2 IMPLANT
PORT ACCESS TROCAR AIRSEAL 5M (TROCAR) ×2
RELOAD STAPLE 45 4.1 GRN THCK (STAPLE) ×2 IMPLANT
SEAL CANN UNIV 5-8 DVNC XI (MISCELLANEOUS) ×8 IMPLANT
SEAL XI 5MM-8MM UNIVERSAL (MISCELLANEOUS) ×4
SET TRI-LUMEN FLTR TB AIRSEAL (TUBING) ×3 IMPLANT
SOLUTION ELECTROLUBE (MISCELLANEOUS) ×3 IMPLANT
SPONGE GAUZE 2X2 STER 10/PKG (GAUZE/BANDAGES/DRESSINGS) ×1
SPONGE LAP 4X18 RFD (DISPOSABLE) ×3 IMPLANT
STAPLE RELOAD 45 GRN (STAPLE) ×2 IMPLANT
STAPLE RELOAD 45MM GREEN (STAPLE) ×3
SUT ETHILON 3 0 PS 1 (SUTURE) ×3 IMPLANT
SUT MNCRL AB 4-0 PS2 18 (SUTURE) ×6 IMPLANT
SUT PDS AB 1 CT1 27 (SUTURE) ×6 IMPLANT
SUT VIC AB 2-0 SH 27 (SUTURE) ×3
SUT VIC AB 2-0 SH 27X BRD (SUTURE) ×2 IMPLANT
SUT VICRYL 0 UR6 27IN ABS (SUTURE) ×3 IMPLANT
SUT VLOC BARB 180 ABS3/0GR12 (SUTURE) ×9
SUTURE VLOC BRB 180 ABS3/0GR12 (SUTURE) ×6 IMPLANT
SYR 27GX1/2 1ML LL SAFETY (SYRINGE) ×3 IMPLANT
TOWEL OR NON WOVEN STRL DISP B (DISPOSABLE) ×3 IMPLANT
WATER STERILE IRR 1000ML POUR (IV SOLUTION) ×3 IMPLANT

## 2017-12-08 NOTE — Transfer of Care (Signed)
Immediate Anesthesia Transfer of Care Note  Patient: [name]  Procedure(s) Performed: XI ROBOTIC ASSISTED LAPAROSCOPIC RADICAL PROSTATECTOMY (N/A ) LYMPHADENECTOMY (Bilateral )  Patient Location: PACU  Anesthesia Type:General  Level of Consciousness: drowsy and patient cooperative  Airway & Oxygen Therapy: Patient Spontanous Breathing and Patient connected to face mask  Post-op Assessment: Report given to RN and Post -op Vital signs reviewed and stable  Post vital signs: Reviewed and stable  Last Vitals:  Vitals Value Taken Time  BP 143/85 12/08/2017  2:53 PM  Temp    Pulse 87 12/08/2017  2:56 PM  Resp 19 12/08/2017  2:56 PM  SpO2 100 % 12/08/2017  2:56 PM  Vitals shown include unvalidated device data.  Last Pain:  Vitals:   12/08/17 0948  TempSrc: Oral      Patients Stated Pain Goal: 3 (16/10/96 0454)  Complications: No apparent anesthesia complications

## 2017-12-08 NOTE — Anesthesia Postprocedure Evaluation (Signed)
Anesthesia Post Note  Patient: JUELL RADNEY  Procedure(s) Performed: XI ROBOTIC ASSISTED LAPAROSCOPIC RADICAL PROSTATECTOMY (N/A ) LYMPHADENECTOMY (Bilateral )     Patient location during evaluation: PACU Anesthesia Type: General Level of consciousness: awake Pain management: pain level controlled Vital Signs Assessment: post-procedure vital signs reviewed and stable Respiratory status: spontaneous breathing Cardiovascular status: stable Postop Assessment: no apparent nausea or vomiting Anesthetic complications: no    Last Vitals:  Vitals:   12/08/17 0948 12/08/17 1500  BP: 114/82 (!) 147/94  Pulse: 95 87  Resp: 16 16  Temp: 36.8 C 36.6 C  SpO2: 93% 100%    Last Pain:  Vitals:   12/08/17 1500  TempSrc:   PainSc: Asleep                 Yaris Ferrell

## 2017-12-08 NOTE — Anesthesia Preprocedure Evaluation (Signed)
Anesthesia Evaluation  Patient identified by MRN, date of birth, ID band Patient awake    Reviewed: Allergy & Precautions, NPO status , Patient's Chart, lab work & pertinent test results  History of Anesthesia Complications (+) PONV  Airway Mallampati: II  TM Distance: >3 FB     Dental   Pulmonary shortness of breath, sleep apnea , pneumonia,    breath sounds clear to auscultation       Cardiovascular hypertension,  Rhythm:Regular Rate:Normal     Neuro/Psych    GI/Hepatic Neg liver ROS, GERD  ,  Endo/Other  negative endocrine ROS  Renal/GU negative Renal ROS     Musculoskeletal   Abdominal   Peds  Hematology   Anesthesia Other Findings   Reproductive/Obstetrics                             Anesthesia Physical Anesthesia Plan  ASA: III  Anesthesia Plan: General   Post-op Pain Management:    Induction:   PONV Risk Score and Plan: Treatment may vary due to age or medical condition, Ondansetron, Dexamethasone and Midazolam  Airway Management Planned: Oral ETT  Additional Equipment:   Intra-op Plan:   Post-operative Plan: Possible Post-op intubation/ventilation  Informed Consent: I have reviewed the patients History and Physical, chart, labs and discussed the procedure including the risks, benefits and alternatives for the proposed anesthesia with the patient or authorized representative who has indicated his/her understanding and acceptance.   Dental advisory given  Plan Discussed with: CRNA  Anesthesia Plan Comments:         Anesthesia Quick Evaluation

## 2017-12-08 NOTE — Progress Notes (Addendum)
Pt  C/o of pain 7/10 after 1  Hour of IV Dilaudid, order q 2 hrs, will give PO at this time. Pt takes trazadone when in hospital and OTC sleep aid at night. Urology on call called, received verbal order with readback FOR MacdDiarmid for Dilaudid 1-2 mg IV prn q 1 hour; Ambien 5 mg PO prn for sleep once.

## 2017-12-08 NOTE — Progress Notes (Signed)
Pt provided flutter for use PRN.  Pt educated using the teach back technique.  Pt demonstrated excellent technique using the flutter.

## 2017-12-08 NOTE — Discharge Instructions (Signed)

## 2017-12-08 NOTE — H&P (Signed)
[name] is an 61 y.o. male.    Chief Complaint: Pre-Op Prostatectomy  HPI:   1 - Moderate Risk Prostate Cancer - 6 cores mix of Gleason 6 and 4+3=7 disease up to 50% of core by BX 2019 on eval rising PSA on exogenous androgens. Bilateral lateral and apical positivity. 06/2017 PSA 2.78 (PCP labs) / DRE 50gm smooth ==> TRUS BX 29 mL, no median lobe, Some Rt lateral peripheral zone cysts.   2 - Urethral Stricture - h/o sricutre managed by OR dialation in 1980s by Sural per report, no problems since.    PMH sig for back surgeyr x 6 (now not limiting), Rt partial pneumonectomy for recurrent psudomonas PNA (bronchectasis, non-smoker, now not limiting, follows Dr. Glory Rosebush) His PCP is Aura Dials MD with [facility].   Today " [name] " is seen to proceed with robotic prostatectomy. No interval fevers.      Past Medical History:  Diagnosis Date  . Bronchiectasis ([place])   . Bronchiolitis   . Dizziness and giddiness 07/26/2012   none recent  . DJD (degenerative joint disease)    back and neck  . Dyspnea    with walking long distances  . Gastroesophageal reflux disease   . History of kidney stones yrs ago   passed on own  . HTN (hypertension)   . OSA (obstructive sleep apnea)    none since 30 lb weight loss  . Pneumonia last time 2017  . Pneumonia with lung abscess ([place])   . PONV (postoperative nausea and vomiting)    likes scopolamine patch, trouble turning neck to left  . Prostate cancer ([place]) dx Aug 12, 2017  . Tear of left rotator cuff    healed on own    Past Surgical History:  Procedure Laterality Date  . APPENDECTOMY  1969  . BACK SURGERY     multiple, DJD, cervical fusion, plate removed  . CARPAL TUNNEL RELEASE Right 1999  . COLONOSCOPY  12/1980   bleeding from penis and anus after beach trip; colonoscopy and removed urinary stricture at same time  . KNEE ARTHROSCOPY     left  . LUNG SURGERY  2012,    removed right middle lobe,due to birth defect, removed  half of right bottom lobe due to abcess  . LUNG SURGERY  07/2013   removed bottom right middle and lower lobe  . NECK SURGERY  1914,7829, 2002  . OTHER SURGICAL HISTORY  2016   removed 4" of lower right rib  . OTHER SURGICAL HISTORY     has had 8 PICC lines placed over the course of several years.  . Right video-assisted thoracoscopic surgery,  12/05/2010  . TONSILLECTOMY  1965  . VIDEO BRONCHOSCOPY     Dr Arlyce Dice    Family History  Problem Relation Age of Onset  . Stroke Mother   . Alzheimer's disease Mother   . Transient ischemic attack Mother   . Chorea Mother   . Liver disease Father   . Diabetes Father   . Congestive Heart Failure Father   . Other Other        sibling with possible right middle lobe issues  . Ataxia Brother   . Ataxia Maternal Aunt   . Prostate cancer Maternal Grandfather   . Prostate cancer Cousin   . Prostate cancer Cousin   . Prostate cancer Cousin    Social History:  reports that he has never smoked. He has never used smokeless tobacco. He reports that  he does not drink alcohol or use drugs.  Allergies:  Allergies  Allergen Reactions  . Gentamicin Other (See Comments)    BALANCE ISSUES  . Morphine And Related Nausea And Vomiting  . Tobramycin Nausea And Vomiting and Other (See Comments)    Ototoxicity, when administered in IV     No medications prior to admission.    No results found for this or any previous visit (from the past 48 hour(s)). No results found.  Review of Systems  Constitutional: Negative.  Negative for chills and fever.  HENT: Negative.   Eyes: Negative.   Respiratory: Negative.   Cardiovascular: Negative.   Gastrointestinal: Negative.   Genitourinary: Negative.   Musculoskeletal: Negative.   Skin: Negative.   Neurological: Negative.   Endo/Heme/Allergies: Negative.   Psychiatric/Behavioral: Negative.     There were no vitals taken for this visit. Physical Exam  Constitutional: He appears well-developed.   HENT:  Head: Normocephalic.  Neck: Normal range of motion.  Cardiovascular: Normal rate.  Respiratory: Effort normal.  GI: Soft.  Genitourinary:  Genitourinary Comments: No CVAT  Musculoskeletal: Normal range of motion.  Neurological: He is alert.  Skin: Skin is warm.  Psychiatric: He has a normal mood and affect.     Assessment/Plan  Proceed as planned with robotic prostatecotmy with ICG and node diessection . Risks, benefits, alternatives, expected peri-op course discussed previouslyia and reiterated today. He understands his above normal pulm risk.   Alexis Frock, MD 12/08/2017, 8:27 AM

## 2017-12-08 NOTE — Anesthesia Procedure Notes (Signed)
Procedure Name: Intubation Date/Time: 12/08/2017 11:46 AM Performed by: Claudia Desanctis, CRNA Pre-anesthesia Checklist: Patient identified, Emergency Drugs available, Suction available and Patient being monitored Patient Re-evaluated:Patient Re-evaluated prior to induction Oxygen Delivery Method: Circle system utilized Preoxygenation: Pre-oxygenation with 100% oxygen Induction Type: IV induction Ventilation: Mask ventilation without difficulty Laryngoscope Size: 2 and Miller Grade View: Grade I Tube type: Oral Tube size: 8.0 mm Number of attempts: 1 Airway Equipment and Method: Stylet Placement Confirmation: ETT inserted through vocal cords under direct vision,  positive ETCO2 and breath sounds checked- equal and bilateral Secured at: 22 cm Tube secured with: Tape Dental Injury: Teeth and Oropharynx as per pre-operative assessment

## 2017-12-08 NOTE — Brief Op Note (Signed)
12/08/2017  2:40 PM  PATIENT:  [name]  62 y.o. male  PRE-OPERATIVE DIAGNOSIS:  PROSTATE CANCER  POST-OPERATIVE DIAGNOSIS:  PROSTATE CANCER  PROCEDURE:  Procedure(s): XI ROBOTIC ASSISTED LAPAROSCOPIC RADICAL PROSTATECTOMY (N/A) LYMPHADENECTOMY (Bilateral)  SURGEON:  Surgeon(s) and Role:    * Alexis Frock, MD - Primary  PHYSICIAN ASSISTANT:   ASSISTANTS: Debbrah Alar PA    ANESTHESIA:   local and general  EBL:  100 mL   BLOOD ADMINISTERED:none  DRAINS: 1 - JP to bulb; 2 - Foley to gravity   LOCAL MEDICATIONS USED:  MARCAINE     SPECIMEN:  Source of Specimen:  1 - periprostatic fat, 2 - prostatectomy, 3 - pelvic lymph nodes  DISPOSITION OF SPECIMEN:  PATHOLOGY  COUNTS:  YES  TOURNIQUET:  * No tourniquets in log *  DICTATION: .Other Dictation: Dictation Number [id number]  PLAN OF CARE: Admit for overnight observation  PATIENT DISPOSITION:  PACU - hemodynamically stable.   Delay start of Pharmacological VTE agent (>24hrs) due to surgical blood loss or risk of bleeding: yes

## 2017-12-08 NOTE — Op Note (Deleted)
NAMEGABLE, ODONOHUE MEDICAL RECORD OA:[id number] ACCOUNT [id number] DATE OF BIRTH:1955/04/12 FACILITY: [facility] LOCATION: [facility] PHYSICIAN:Carita Sollars, MD  OPERATIVE REPORT  DATE OF PROCEDURE:  12/08/2017  PREOPERATIVE DIAGNOSES:  Moderate risk prostate cancer, history of perirectal abscess.  PROCEDURES: 1.  Robotic-assisted laparoscopic radical prostatectomy. 2.  Bilateral pelvic laminectomy. 3.  Injection of an indocyanine green dye for sentinel lymph angiography.  ASSISTANT:  Bari Mantis, PA  ESTIMATED BLOOD LOSS:  100 mL.  COMPLICATIONS:  None.  SPECIMENS: 1.  Radical prostatectomy. 2.  Periprosthetic fat. 3.  Right external iliac lymph nodes. 4.  Right obturator lymph nodes. 5.  Right internal iliac lymph nodes, sentinel 6.  Left external iliac lymph nodes. 7.  Left obturator lymph nodes.  FINDINGS: 1.  Somewhat enlarged, likely reactive pelvic lymph nodes felt likely reactive given history of prior perirectal abscess, now resolved. 2.  Mild inflammatory response in the right posterior lateral aspect of the prostate, likely reactive to prior perirectal abscess. 3.  Single sentinel lymph node, right internal iliac chain.  INDICATIONS:  The patient is a 62 year old gentleman who was found on workup of elevated and rising PSA to harbor multifocal adenocarcinoma of the prostate.  Options were discussed for management including surveillance protocols versus ablative therapy  for surgical extirpation and he wished to proceed with prostatectomy.  He does have a history of chronic pneumonia from pseudomonas; however, he is well compensated with this.  He does have pulmonary clearance.  He also has a history of a perirectal  abscess.  This is well healed clinically.  Informed consent was obtained and placed in medical record.  DESCRIPTION OF PROCEDURE:  The patient being [name], procedure being radical prostatectomy was confirmed.  Procedure timeout was performed.   Intravenous antibiotics administered.  General endotracheal anesthesia induced.  The patient was placed  into a low lithotomy position and sterile field was created by prepping and draping the patient's penis, perineum and proximal thighs using iodine and his infra-xiphoid abdomen using chlorhexidine gluconate after clipper shaving.  He was further fastened  to the table using 3-inch tape over foam padding across the supraxiphoid chest.  A test of steep Trendelenburg positioning was performed and found to be suitably positioned.  Foley catheter was placed free to straight drain.  Next, a high-flow,  low-pressure pneumoperitoneum was obtained using Veress technique in the infraumbilical midline having passed the aspiration and drop test.  An 8 mm robotic camera port was then placed in the same location.  Laparoscopic examination of the peritoneal  cavity revealed no significant adhesions, no visceral injury.  Additional ports were placed as follows:  Right paramedian 8 mm robotic port, right far lateral 12 mm AirSeal assist port, right paramedian 5 mm suction port, left paramedian 8 mm robotic  port, left bilateral 8 mm robotic port.  Robot was docked and passed electronic checks.  Attention was directed to developing the space of Retzius.  Incision was made lateral to the left median umbilical ligament from the midline towards the area of the  internal ring, coursing along the iliac vessels towards the area of the left ureter.  Left vas deferens was ligated and gave [place] a medial bucket handle and the left bladder wall was swept away from the lateral aspect of the pelvis towards the area of the  endopelvic fascia on the left side.  A mirror image dissection performed on the right side.  Anterior attachments were taken down using cautery scissors to expose the anterior base  of the prostate, which was defatted to better define the bladder neck,  prostate junction.  This was set aside labeled as periprosthetic  fat.  Next, 0.2 mL of an indocyanine green dye was injected using a percutaneously placed robotically guided spinal needle,  0.2 mL into each lobe of the prostate with intervening  suctioning to prevent dye spillage, which did not occur.  The endopelvic fascia was swept away from the lateral aspect of the prostate and base to apex orientation to expose the dorsal venous complex which was controlled using vascular stapler, taking  exquisite care to avoid membranous urethral injury which did not occur.  Then, approximately 10 minutes post-dye injection in the pelvis was inspected under near infrared fluorescence light.  Sentinel lymph angiography revealed good prostatic parenchymal uptake of the dye.  There were no obvious sentinel lymph nodes within the external iliac, obturator groups on either side.  There was a single dominant sentinel lymphatic seen in the right  internal iliac group and a single sentinel node along this tract.  As such, template lymphadenectomy was performed, first on the left side.  The left external iliac group with the boundaries being left external iliac vein, pelvic sidewall, iliac vessels,  lymphostasis was achieved with cold clips, set aside, labeled left external iliac lymph nodes.  Notably, there was an area of there was one large difficult, likely reactive.  Next, the left obturator group was dissected free with the boundaries being  left external iliac vein, pelvic sidewall and obturator nerve.  Lymphostasis was achieved with cold clips to the SI level of 6 left obturator lymph nodes.  Left obturator nerve was inspected following these maneuvers and found to be uninjured.   Similarly, a right-sided template lymphadenectomy was performed in the right external iliac and right obturator group respectively.  The right obturator group was inspected and found to be uninjured.  The area of sentinel lymphatic on the internal iliac  group was dissected free, set aside and labeled as  such  attention was then directed to the bladder neck dissection.  The bladder neck was identified in the anterior plane and a lateral release was performed to better approximate bladder neck caliber and  bladder neck was separated from the base of the prostate in anterior, posterior direction, keeping what appeared to be a rim of circular muscle fibers with each plane of dissection.  Posterior dissection was performed by incising approximately 7 mm  inferior posterior portion of the prostate and entering the plane of [place]onvilliers.  Bilateral vas deferens were encountered, dissected for a distance of approximately 4 cm, ligated and placed on gentle superior traction.  Bilateral seminal vesicles were  dissected to their tip and placed on gentle superior traction.  Dissection proceeded within this plane towards the apex of the prostate.  This exposed the vascular pedicles on each side.  These were controlled using a sequential clipping technique, the  base to apex orientation performing purposeful wide non nerve-sparing dissection given the patient's bilateral lateral cancer positivity.  Final apical dissection was performed in the anterior plane placing the prostate on gentle superior traction  transecting the membranous urethra coldly.  This completely freed up the prostate specimen and was placed in an EndoCatch bag for later retrieval.  Digital rectal exam was then performed using indicator glove and under laparoscopic vision.  No evidence  of rectal violation was noted.  Posterior dissection was performed using a 3-0 V-Loc suture reapproximating the posterior urethral plate to the posterior  bladder neck, bringing the structures in a tension-free apposition.  Next mucosa-to-mucosa  anastomosis was performed using double armed 3-0 V-Loc suture from the 6 o'clock to 12 o'clock position which resulted in excellent tension-free apposition.  A Foley catheter was then placed which irrigated quantitatively.  All  sponge, needle counts were  correct.  Hemostasis appeared excellent.  A closed suction drain was brought out the previous left lateral most robotic port site into the peritoneal cavity.  The previous right lateral most assistant port site was closed with fascia using 0 Vicryl and  a suture passer.  Robot was then undocked.  Specimen was retrieved by extending the previous camera port site inferiorly for a total distance of approximately 4 cm, removing the prostatectomy specimen and setting aside for pathology.  Extraction site was  closed with fascia using figure-of-eight PDS x3, followed by reapproximation of Scarpa's with a running Vicryl.  All incision sites were infiltrated with dilute lipolyzed Marcaine and closed the skin using subcuticular Monocryl and Dermabond.  Procedure  was then terminated.    The patient tolerated the procedure well with no immediate complications.  The patient was taken to [facility] in stable condition.  Please note Bari Mantis was crucial all portions of the procedure today.  She provided invaluable vascular clipping, specimen manipulation, catheter manipulation and first assistance.  AN/NUANCE  D:12/08/2017 T:12/08/2017 JOB:[id number]

## 2017-12-09 ENCOUNTER — Encounter (HOSPITAL_COMMUNITY): Payer: Self-pay | Admitting: Urology

## 2017-12-09 DIAGNOSIS — C61 Malignant neoplasm of prostate: Secondary | ICD-10-CM | POA: Diagnosis not present

## 2017-12-09 LAB — BASIC METABOLIC PANEL
Anion gap: 7 (ref 5–15)
BUN: 21 mg/dL (ref 8–23)
CO2: 25 mmol/L (ref 22–32)
Calcium: 8.5 mg/dL — ABNORMAL LOW (ref 8.9–10.3)
Chloride: 105 mmol/L (ref 98–111)
Creatinine, Ser: 0.97 mg/dL (ref 0.61–1.24)
GFR calc Af Amer: >60 mL/min (ref >60)
GFR calc non Af Amer: >60 mL/min (ref >60)
Glucose, Bld: 135 mg/dL — ABNORMAL HIGH (ref 70–99)
Potassium: 4.4 mmol/L (ref 3.5–5.1)
Sodium: 137 mmol/L (ref 135–145)

## 2017-12-09 LAB — HEMOGLOBIN AND HEMATOCRIT, BLOOD
HCT: 39.5 % (ref 39.0–52.0)
Hemoglobin: 13.3 g/dL (ref 13.0–17.0)

## 2017-12-09 MED ORDER — CEPHALEXIN 500 MG PO CAPS: 500.0000 mg | ORAL_CAPSULE | Freq: Two times a day (BID) | ORAL | 0 refills | Status: DC

## 2017-12-09 NOTE — Progress Notes (Signed)
Chuck/pad underneath pt present with blood, brownish in color, 6x8 in approximately. Dressing intact, JP drain intact, leaking from under the clear dressing. Dressing reinforced with split gauze and secured. No pain, no redness, pt denies any new pain or changes. Pt stable, sight WN, NAD. Will continue  monitor.

## 2017-12-09 NOTE — Care Management Obs Status (Signed)
[facility] NOTIFICATION   Patient Details  Name: [name] MRN: [id number] Date of Birth: May 09, 1955   Medicare Observation Status Notification Given:  Yes    Purcell Mouton, RN 12/09/2017, 1:43 PM

## 2017-12-09 NOTE — Discharge Summary (Signed)
Physician Discharge Summary  Patient ID: [name] MRN: [id number] DOB/AGE: [birth year] 62 y.o.  Admit date: 12/08/2017 Discharge date: 12/09/2017  Admission Diagnoses: Prostate Cancer  Discharge Diagnoses:  Active Problems:   Prostate cancer [name])   Discharged Condition: good  Hospital Course: Pt underwent robotic prostatectomy with node dissection on 12/08/17, the day of admission, without acute complication. He was admitted for observation post-op. By the afternoon of POD 1, he is ambulatory, pain controlled on PO meds, tolerating PO nutritition, and felt to be adequate for discharge. Final pathology pending at discharge. Hgb 13, Cr <1. JP output minimal therefore removed prior to discharge.    Consults: None  Significant Diagnostic Studies: labs: as per above  Treatments: surgery: as per above.  Discharge Exam: Blood pressure 124/63, pulse 88, temperature 98.7 F (37.1 C), temperature source Oral, resp. rate 14, height '5\' 9"'  (1.753 m), weight 84.1 kg, SpO2 93 %. General appearance: alert, cooperative and appears stated age Eyes: negative Nose: Nares normal. Septum midline. Mucosa normal. No drainage or sinus tenderness. Throat: lips, mucosa, and tongue normal; teeth and gums normal Neck: supple, symmetrical, trachea midline Back: symmetric, no curvature. ROM normal. No CVA tenderness. Resp: non-labored on room air.  Cardio: Nl rate GI: soft, non-tender; bowel sounds normal; no masses,  no organomegaly Extremities: extremities normal, atraumatic, no cyanosis or edema Pulses: 2+ and symmetric Skin: Skin color, texture, turgor normal. No rashes or lesions Lymph nodes: Cervical, supraclavicular, and axillary nodes normal. Neurologic: Grossly normal Incision/Wound: recent port sites and extraction sites c/d/i. JP with scant serosanguinous output, removed and dry dressing applied. Foley with medium yellow urine that is non-foul.   Disposition:    Allergies as of 12/09/2017       Reactions   Gentamicin Other (See Comments)   BALANCE ISSUES   Morphine And Related Nausea And Vomiting   Tobramycin Nausea And Vomiting, Other (See Comments)   Ototoxicity, when administered in IV       Medication List    STOP taking these medications   B-12 COMPLIANCE INJECTION 1000 MCG/ML Kit Generic drug:  Cyanocobalamin   Iron 325 (65 Fe) MG Tabs   multivitamin with minerals Tabs tablet   vitamin C 1000 MG tablet     TAKE these medications   albuterol (2.5 MG/3ML) 0.083% nebulizer solution Commonly known as:  PROVENTIL Inhale 2.5 mg into the lungs 4 (four) times daily.   albuterol 108 (90 Base) MCG/ACT inhaler Commonly known as:  PROVENTIL HFA;VENTOLIN HFA Inhale 2 puffs into the lungs every 4 (four) hours as needed for wheezing or shortness of breath.   azithromycin 250 MG tablet Commonly known as:  ZITHROMAX Take 250 mg by mouth daily.   BENEFIBER PO Take 2 Doses by mouth daily.   budesonide-formoterol 160-4.5 MCG/ACT inhaler Commonly known as:  SYMBICORT Inhale 2 puffs into the lungs 2 (two) times daily.   cephALEXin 500 MG capsule Commonly known as:  KEFLEX Take 1 capsule (500 mg total) by mouth 2 (two) times daily. X 3 days. Begin day before next Urology appointment.   Colistimethate Sodium Powd Take 2 mLs by nebulization 2 (two) times daily. Use only every other month.   doxylamine (Sleep) 25 MG tablet Commonly known as:  UNISOM Take 25 mg by mouth at bedtime.   fluticasone 50 MCG/ACT nasal spray Commonly known as:  FLONASE Place 2 sprays into both nostrils daily.   FLUTTER Devi Use several times daily as needed for congestion/thick mucus   HYDROcodone-acetaminophen 7.5-325  MG tablet Commonly known as:  NORCO Take 1 tablet by mouth every 6 (six) hours as needed for moderate pain. What changed:    when to take this  reasons to take this   HYDROcodone-homatropine 5-1.5 MG/5ML syrup Commonly known as:  HYCODAN Take 5 mLs by mouth  every 6 (six) hours as needed for cough.   NON FORMULARY Use Acapella twice daily for airway clearance. Dx. 494.0. Mucous clearing device.   omeprazole 20 MG capsule Commonly known as:  PRILOSEC Take 20 mg by mouth 2 (two) times daily.   polyethylene glycol packet Commonly known as:  MIRALAX / GLYCOLAX Take 17 g by mouth daily.   PULMOZYME 1 MG/ML nebulizer solution Generic drug:  dornase alpha Take 2.5 mg by nebulization daily.   Sodium Chloride (Inhalant) 7 % Nebu Take 4 mLs by nebulization 4 (four) times daily.   sterile water for irrigation 4 mLs daily. Mix 47m with colistimethate powder to use for nebulization.   tobramycin (PF) 300 MG/5ML nebulizer solution Commonly known as:  [name] Take 300 mg by nebulization 2 (two) times daily. Use only every other month      Follow-up Information    Nathan Frock MD On 12/21/2017.   Specialty:  Urology Why:  for MD visit, catheter removal, and pathology review. Office will call to confrim time.  Contact information: [phone]          Signed: MAlexis Frock9/19/2019, 1:21 PM

## 2017-12-09 NOTE — Progress Notes (Signed)
Urology Progress Note   1 Day Post-Op s/p RALP  Subjective: NAEON. Feels well this morning. Sitting at bedside preparing to walk. Slept okay bothered by interruptions only No respiratory complaints. Tolerated breathing treatments this am Pain controlled, tolerated clears.  VSS. Hgb 13.3 from 14.1, creatinine 0.97 UOP 2.0L JP 105 cc serosanguinous  Objective: Vital signs in last 24 hours: Temp:  [97.6 F (36.4 C)-98.4 F (36.9 C)] 97.8 F (36.6 C) (09/19 0548) Pulse Rate:  [75-95] 78 (09/19 0548) Resp:  [14-18] 16 (09/19 0548) BP: (110-149)/(69-94) 110/69 (09/19 0548) SpO2:  [91 %-100 %] 95 % (09/19 0756) Weight:  [81 kg-84.1 kg] 84.1 kg (09/18 1629)  Intake/Output from previous day: 09/18 0701 - 09/19 0700 In: 3171.6 [P.O.:910; I.V.:2261.6] Out: 2180 [Urine:1975; Drains:105; Blood:100] Intake/Output this shift: No intake/output data recorded.  Physical Exam:  General: Alert and oriented CV: RRR Lungs: Clear Abdomen: Soft, appropriately tender. JP serosanguinous output. Incisions clean dry and intact with surgical glue GU: Foley in place draining clear yellow urine  Ext: NT, No erythema  Lab Results: Recent Labs    12/08/17 1519 12/09/17 0444  HGB 14.1 13.3  HCT 42.4 39.5   BMET Recent Labs    12/09/17 0444  NA 137  K 4.4  CL 105  CO2 25  GLUCOSE 135*  BUN 21  CREATININE 0.97  CALCIUM 8.5*     Studies/Results: No results found.  Assessment/Plan:  62 y.o. male s/p RALP on 12/09/17.  Overall doing well post-op.   - medlock and advanced to regular diet - continue home breathing treatments and nebulizers - OOBTC and ambulation this AM - foley catheter stays in - plan to discontinue JP drain prior to discharge - possible discharge to home later provided pain is controlled, walking, no respiratory issues and tolerating PO intake   Dispo: floor status, possible home today   LOS: 0 days   Fredricka Bonine 12/09/2017, 8:20 AM

## 2017-12-13 MED FILL — LC PLUS MISC: 30 days supply | Qty: 1 | Fill #0

## 2017-12-13 MED FILL — LC PLUS MISC: 30 days supply | Qty: 1 | Fill #0 | Status: AC

## 2017-12-13 MED FILL — ALCOHOL PREP PADS: 90 days supply | Qty: 100 | Fill #0 | Status: AC

## 2017-12-13 MED FILL — WATER FOR INJECTION, STERILE INJECTION SOLUTION: 30 days supply | Qty: 300 | Fill #0 | Status: AC

## 2017-12-13 MED FILL — BD LUER-LOK SYRINGE 3 ML 20 GAUGE X 1 1/2": 30 days supply | Qty: 60 | Fill #0 | Status: AC

## 2017-12-13 MED FILL — COLISTIN (COLISTIMETHATE SODIUM) 150 MG SOLUTION FOR INJECTION: 30 days supply | Qty: 60 | Fill #0 | Status: AC

## 2017-12-14 NOTE — Op Note (Signed)
Show:Clear all [x] Manual[] Template[] Copied  Added by: [x] Alexis Frock, MD  [] Hover for details NAMEJAKAREE, PICKARD MEDICAL RECORD [id number] ACCOUNT [id number] DATE OF BIRTH:[birth year] FACILITY: [facility] LOCATION: [facility] PHYSICIAN:Tashea Othman, MD  OPERATIVE REPORT  DATE OF PROCEDURE:  12/08/2017  PREOPERATIVE DIAGNOSES:  Moderate risk prostate cancer, history of perirectal abscess.  PROCEDURES: 1.  Robotic-assisted laparoscopic radical prostatectomy. 2.  Bilateral pelvic lyphadenectomy 3.  Injection of an indocyanine green dye for sentinel lymph angiography.  ASSISTANT:  Bari Mantis, PA  ESTIMATED BLOOD LOSS:  100 mL.  COMPLICATIONS:  None.  SPECIMENS: 1.  Radical prostatectomy. 2.  Periprosthetic fat. 3.  Right external iliac lymph nodes. 4.  Right obturator lymph nodes. 5.  Right internal iliac lymph nodes, sentinel 6.  Left external iliac lymph nodes. 7.  Left obturator lymph nodes.  FINDINGS: 1.  Somewhat enlarged, likely reactive pelvic lymph nodes felt likely reactive given history of prior perirectal abscess, now resolved. 2.  Mild inflammatory response in the right posterior lateral aspect of the prostate, likely reactive to prior perirectal abscess. 3.  Single sentinel lymph node, right internal iliac chain.  INDICATIONS:  The patient is a 62 year old gentleman who was found on workup of elevated and rising PSA to harbor multifocal adenocarcinoma of the prostate.  Options were discussed for management including surveillance protocols versus ablative therapy  for surgical extirpation and he wished to proceed with prostatectomy.  He does have a history of chronic pneumonia from pseudomonas; however, he is well compensated with this.  He does have pulmonary clearance.  He also has a history of a perirectal  abscess.  This is well healed clinically.  Informed consent was obtained and placed in medical record.  DESCRIPTION OF  PROCEDURE:  The patient being [name], procedure being radical prostatectomy was confirmed.  Procedure timeout was performed.  Intravenous antibiotics administered.  General endotracheal anesthesia induced.  The patient was placed  into a low lithotomy position and sterile field was created by prepping and draping the patient's penis, perineum and proximal thighs using iodine and his infra-xiphoid abdomen using chlorhexidine gluconate after clipper shaving.  He was further fastened  to the table using 3-inch tape over foam padding across the supraxiphoid chest.  A test of steep Trendelenburg positioning was performed and found to be suitably positioned.  Foley catheter was placed free to straight drain.  Next, a high-flow,  low-pressure pneumoperitoneum was obtained using Veress technique in the infraumbilical midline having passed the aspiration and drop test.  An 8 mm robotic camera port was then placed in the same location.  Laparoscopic examination of the peritoneal  cavity revealed no significant adhesions, no visceral injury.  Additional ports were placed as follows:  Right paramedian 8 mm robotic port, right far lateral 12 mm AirSeal assist port, right paramedian 5 mm suction port, left paramedian 8 mm robotic  port, left bilateral 8 mm robotic port.  Robot was docked and passed electronic checks.  Attention was directed to developing the space of Retzius.  Incision was made lateral to the left median umbilical ligament from the midline towards the area of the  internal ring, coursing along the iliac vessels towards the area of the left ureter.  Left vas deferens was ligated and gave [place] a medial bucket handle and the left bladder wall was swept away from the lateral aspect of the pelvis towards the area of the  endopelvic fascia on the left side.  A mirror image dissection performed  on the right side.  Anterior attachments were taken down using cautery scissors to expose the anterior base of the  prostate, which was defatted to better define the bladder neck,  prostate junction.  This was set aside labeled as periprosthetic fat.  Next, 0.2 mL of an indocyanine green dye was injected using a percutaneously placed robotically guided spinal needle,  0.2 mL into each lobe of the prostate with intervening  suctioning to prevent dye spillage, which did not occur.  The endopelvic fascia was swept away from the lateral aspect of the prostate and base to apex orientation to expose the dorsal venous complex which was controlled using vascular stapler, taking  exquisite care to avoid membranous urethral injury which did not occur.  Then, approximately 10 minutes post-dye injection in the pelvis was inspected under near infrared fluorescence light.  Sentinel lymph angiography revealed good prostatic parenchymal uptake of the dye.  There were no obvious sentinel lymph nodes within the external iliac, obturator groups on either side.  There was a single dominant sentinel lymphatic seen in the right  internal iliac group and a single sentinel node along this tract.  As such, template lymphadenectomy was performed, first on the left side.  The left external iliac group with the boundaries being left external iliac vein, pelvic sidewall, iliac vessels,  lymphostasis was achieved with cold clips, set aside, labeled left external iliac lymph nodes.  Notably, there was an area of there was one large difficult, likely reactive.  Next, the left obturator group was dissected free with the boundaries being  left external iliac vein, pelvic sidewall and obturator nerve.  Lymphostasis was achieved with cold clips to the SI level of 6 left obturator lymph nodes.  Left obturator nerve was inspected following these maneuvers and found to be uninjured.   Similarly, a right-sided template lymphadenectomy was performed in the right external iliac and right obturator group respectively.  The right obturator group was inspected and  found to be uninjured.  The area of sentinel lymphatic on the internal iliac  group was dissected free, set aside and labeled as such  attention was then directed to the bladder neck dissection.  The bladder neck was identified in the anterior plane and a lateral release was performed to better approximate bladder neck caliber and  bladder neck was separated from the base of the prostate in anterior, posterior direction, keeping what appeared to be a rim of circular muscle fibers with each plane of dissection.  Posterior dissection was performed by incising approximately 7 mm  inferior posterior portion of the prostate and entering the plane of Denonvilliers.  Bilateral vas deferens were encountered, dissected for a distance of approximately 4 cm, ligated and placed on gentle superior traction.  Bilateral seminal vesicles were  dissected to their tip and placed on gentle superior traction.  Dissection proceeded within this plane towards the apex of the prostate.  This exposed the vascular pedicles on each side.  These were controlled using a sequential clipping technique, the  base to apex orientation performing purposeful wide non nerve-sparing dissection given the patient's bilateral lateral cancer positivity.  Final apical dissection was performed in the anterior plane placing the prostate on gentle superior traction  transecting the membranous urethra coldly.  This completely freed up the prostate specimen and was placed in an EndoCatch bag for later retrieval.  Digital rectal exam was then performed using indicator glove and under laparoscopic vision.  No evidence  of rectal violation was noted.  Posterior dissection was performed using a 3-0 V-Loc suture reapproximating the posterior urethral plate to the posterior bladder neck, bringing the structures in a tension-free apposition.  Next mucosa-to-mucosa  anastomosis was performed using double armed 3-0 V-Loc suture from the 6 o'clock to 12 o'clock  position which resulted in excellent tension-free apposition.  A Foley catheter was then placed which irrigated quantitatively.  All sponge, needle counts were  correct.  Hemostasis appeared excellent.  A closed suction drain was brought out the previous left lateral most robotic port site into the peritoneal cavity.  The previous right lateral most assistant port site was closed with fascia using 0 Vicryl and  a suture passer.  Robot was then undocked.  Specimen was retrieved by extending the previous camera port site inferiorly for a total distance of approximately 4 cm, removing the prostatectomy specimen and setting aside for pathology.  Extraction site was  closed with fascia using figure-of-eight PDS x3, followed by reapproximation of Scarpa's with a running Vicryl.  All incision sites were infiltrated with dilute lipolyzed Marcaine and closed the skin using subcuticular Monocryl and Dermabond.  Procedure  was then terminated.    The patient tolerated the procedure well with no immediate complications.  The patient was taken to [facility] in stable condition.  Please note Bari Mantis was crucial all portions of the procedure today.  She provided invaluable vascular clipping, specimen manipulation, catheter manipulation and first assistance.  AN/NUANCE  D:12/08/2017 T:12/08/2017 JOB:[id number]

## 2017-12-28 DIAGNOSIS — D7589 Other specified diseases of blood and blood-forming organs: Secondary | ICD-10-CM | POA: Diagnosis not present

## 2018-01-05 DIAGNOSIS — D7589 Other specified diseases of blood and blood-forming organs: Secondary | ICD-10-CM | POA: Diagnosis not present

## 2018-01-12 DIAGNOSIS — D7589 Other specified diseases of blood and blood-forming organs: Secondary | ICD-10-CM | POA: Diagnosis not present

## 2018-01-18 ENCOUNTER — Encounter: Admit: 2018-01-18 | Discharge: 2018-01-18 | Payer: MEDICARE | Attending: Internal Medicine | Primary: Internal Medicine

## 2018-01-18 ENCOUNTER — Encounter: Admit: 2018-01-18 | Discharge: 2018-01-18 | Payer: MEDICARE

## 2018-01-18 DIAGNOSIS — C61 Malignant neoplasm of prostate: Secondary | ICD-10-CM | POA: Diagnosis not present

## 2018-01-18 DIAGNOSIS — M19012 Primary osteoarthritis, left shoulder: Secondary | ICD-10-CM | POA: Diagnosis not present

## 2018-01-18 DIAGNOSIS — J479 Bronchiectasis, uncomplicated: Secondary | ICD-10-CM | POA: Diagnosis not present

## 2018-01-18 MED ORDER — HYDROCODONE-HOMATROPINE 5 MG-1.5 MG/5 ML ORAL SYRUP
ORAL | 0 refills | 0.00000 days | Status: CP | PRN
Start: 2018-01-18 — End: 2018-02-14

## 2018-01-18 NOTE — Unmapped (Signed)
Nasal NO taken 01/18/2018  02:25pm  ??  Flow Rate: .33??(l/min)  Method:??Therapist, music: NAC  Ambient:??3.4ppb  ??  Right Nostril:  167.76??(nl/min)  ??  Left Nostril:  207.26??(nl/min)  ??  Mean of Left and Right Nostril:  187.5??(nl/min)

## 2018-01-18 NOTE — Unmapped (Addendum)
Tell your son that I recommend getting a chest CT with 0.1 mm slices. I would like to review the images.    Continue current airway clearance.  Let me know if you feel like current method isn't working. Look in to postural drainage.    Symptoms of a bronchiectasis exacerbation: 48 hours of at least three of the following:  ?? Increased cough  ?? Change in volume or appearance of sputum  ?? Increased sputum purulence  ?? Worsening shortness of breath and/or exercise tolerance  ?? Fatigue and/or malaise  ?? Coughing up blood (hemoptysis)    If you are experiencing some of these symptoms:  ?? Increase the frequency and/or intensity of your airway clearance  ?? Try to submit a sputum sample for bacterial and AFB cultures (at [facility] or locally)  ?? Reach out to me or your local physician.  ?? If you need antibiotics, recommend treating for 14 days.      Please bring any new airway clearance equipment to your next visit to ensure that you are using and caring for it properly.    Thank you for allowing me to be a part of your care. Please call the clinic with any questions.    Viona Gilmore, MD, MPH  Pulmonary and Critical Care Medicine  [address]  [id number]  [place], [place] [address]    Thank you for your visit to the [facility] Pulmonary Clin[facility]. You may receive a survey from [facility] regarding your visit today, and we are eager to use this feedback to improve your experience. Thank you for taking the time to fill it out.    Between appointments, you can reach [place] at these numbers:    For appointments or the Pulmonary Nurse: [phone]  Fax: [phone]    For urgent issues after hours:  Hospital Operator: [phone], ask for Pulmonary Fellow on call    For further information, check out the websites below:    [facility] for Bronchiectasis: [email or web address]    World Bronchiectasis Conference in [place] Patient session October 03, 2016: Videos can be watched here.     Bronchiectasis Toolbox (information about airway clearance): DiningCalendar.de    Copy for Individuals with Bronchiectasis and/or NTM through the Bronchiectasis Registry and the COPD Foundation: https://www.mckee-young.org/    Information about Non-tuberculous Mycobacterial Infections:  https://www.ntminfo.org     Interested in clinical trials and other research opportunities?  www.clinicaltrials.gov  The Rare Diseases Clinical Research Network [facility]) Genetic Disorders of  Mucociliary Clearance Consortium [facility]) Contact Registry is a way for patients with disorders of mucociliary clearance (such as bronchiectasis) and their family members to learn about research studies they may be able to join. Participation is completely voluntary and you may choose to withdraw at any time. There is no cost to join the [employer]. For more information or to join the registry please go to the following website:  [email or web address]

## 2018-01-18 NOTE — Unmapped (Signed)
[facility] [place]  [facility]    Assessment:      Patient:Jordan [name] Resides (1955/05/15)  Reason for visit: Mr.Udovich is a 62 y.o.male who returns for post-hospitalization follow-up of bronchiectasis secondary to unknown cause (idiopathic) with Pseudomonas colonization s/p resection of RML and RLL, resection of right 11 and 12th ribs, and recently diagnosed prostate cancer.     Plan:      Problem List Items Addressed This Visit        Respiratory    Bronchiectasis (CMS-HCC)    Relevant Orders    AFB SMEAR (Completed)        Reinforced importance of regular airway clearance.  In bronchiectasis, there is impaired mucus clearance from both the airway changes and from the underlying cause of the bronchiectasis.  This impaired clearance leads to accumulation of mucus, resulting in inflammation, accumulation of bacteria, and worsening damage to the airways.  Therefore, augmentation of airway clearance is critical. Farren was provided websites with information about bronchiectasis, social forums for patients with bronchiectasis and NTM, and airway clearance techniques    Patient did not need to meet with the respiratory therapist today. Patient should bring any new airway clearance devices to the next visit to review technique with the respiratory therapist.  No changes were made to airway clearance regimen.  No changes were made to inhaled medications.  He will continue to use his Pulmozyme daily and alternating months of inhaled antibiotics.  Provided him with information on postural drainage.    Patient spontaneously expectorated sputum that was sent for bacterial and AFB cultures. These results will help to guide antibiotic therapy in the future during exacerbations.  If he has future exacerbations, sputum should be sent for bacterial and AFB cultures. The bacterial culture should be processed like a cystic fibrosis sample given the overlapping pathogens. There are standing orders for these cultures in our system.    Kiel received the quadrivalent influenza vaccine this fall. He is up to date on pneumococcal vaccination, having received the PPSV 23 (Pneumovax) on 02/07/16 and PCV13 (Prevnar) on 04/28/13.  A repeat PPSV23 should be given on 02/06/2021 (1 year after PCV13 and 5 years post PPSV23). He is up to date for the Tdap vaccine.    Amontae will return to clinic in 3 months for follow-up with pre-bronchodilator spirometry and sputum cultures.  He will call or send me a message via MyChart if questions or concerns arise before this visit.      Subjective:      HPI: Jordan [name] is a 62 y.o. male who returns for follow-up of bronchiectasis secondary to unknown cause (idiopathic) with Pseudomonas colonization.  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 He was recently diagnosed with prostate cancer and underwent prostatectomy in September.     Respiratory Symptoms:  ? Cough: productive of light, thin sputum.  Brings up light yellow sputum if leans forward  ? Nocturnal awakenings: absent.  ? Wheezing: absent.  ? Chest tightness: absent.  ? Rescue albuterol use: when walking but has improved with cooler weather.  ? Pleurisy: absent.  ? Hemoptysis: absent.  ? MMRC: 2 = On level ground, I walk slower than people of the same age because of breathlessness or have  to stop for breath when walking at my own pace for 15 minutes.    Exacerbations:  ? Number of exacerbations treated in the past year: 4  ? Dates of exacerbations: 02/07/17, 04/14/17, 09/27/17, 11/09/17  ? Number of hospitalizations for exacerbations in the past 2 years: 2  ? Dates of hospitalizations: 01/22/16, 04/14/17    Pulmonary Therapies:  ? Airway clearance: Aerobika 4 times per day and Hypertonic Saline 7% 4 times per day.  Also using Pulmozyme once a day.  ? Inhaled antibiotics: Alternating 28 day cycles with [name] and Colistin.   ? Inhalers: Symbicort 160 with spacer twice daily  ? Chronic antibiotics: Azithromycin 250 mg daily  ? Exercise: Walking.  ? Pulmonary Rehab: Hasn't done  ? Supplemental oxygen: Not indicated  ? NIPPV: Stopped using CPAP.    Review of Systems  Sunday morning - scratchy throat but has resolved. No F/C/NS. Remainder of a complete review of systems was negative unless mentioned above.    Past Medical History:   Diagnosis Date   ??? Abscess of lung (CMS-HCC) 11/06/2010    CT Chest 10/22/10 12/05/2010 right thoracotomy resection of right middle lobe and resection of right lower lobe abscess    ??? Arthritis     back   ??? Biceps tendon tear 2013    left side   ??? Bronchiectasis (CMS-HCC)     chronic psuedomonas infection   ??? Degenerative joint disease of left acromioclavicular joint    ??? GERD (gastroesophageal reflux disease)    ??? Hypertension    ??? Obstructive sleep apnea on CPAP 03/14/13    AHI 33.5, on BiPAP 12/8 based on sleep study 09/2013   ??? Pneumonia 2012    Lung abcess    ??? PONV (postoperative nausea and vomiting)    ??? Prostate cancer (CMS-HCC) 09/09/2017   ??? Rotator cuff injury left   ??? Vertigo      Current Outpatient Medications   Medication Sig Dispense Refill   ??? albuterol 2.5 mg /3 mL (0.083 %) nebulizer solution Inhale 2.5 mg by nebulization Four (4) times a day.     ??? albuterol HFA 90 mcg/actuation inhaler Inhale 2 puffs every four (4) hours as needed for wheezing or shortness of breath. 3 Inhaler 3   ??? alcohol swabs PadM USE AS DIRECTED WITH INHALED ANTIBIOTICS 100 each 5   ??? ascorbic acid (CHEWABLE VITAMIN C) 250 mg Chew Chew 1,000 mg Two (2) times a day.      ??? azithromycin (ZITHROMAX) 250 MG tablet Take 1 tablet (250 mg total) by mouth daily. 30 tablet 11   ??? BENEFIBER, GUAR GUM, ORAL Take 1 Act by mouth Two (2) times a day. Take 1 tablespoon BID     ??? budesonide-formoterol (SYMBICORT) 160-4.5 mcg/actuation inhaler Inhale 2 puffs Two (2) times a day. 3 Inhaler 1   ??? CIALIS 20 mg tablet      ??? colistimethate (COLYMYCIN) 150 mg injection INJECT 2 ML STERILE WATER FOR INJECTION INTO COLISTIN VIAL THEN WITHDRAW 2 ML 60 each 5   ??? dornase alfa (PULMOZYME) 1 mg/mL nebulizer solution Inhale 2.5 mg daily. Use at least 30-60 minutes before airway clearance, or after airway clearance 75 mL 11   ??? ferrous sulfate (IRON, FERROUS SULFATE,) 325 (65 FE) MG tablet Take 325 mg by mouth Two (2) times a day.     ??? HYDROcodone-acetaminophen (NORCO) 7.5-325 mg per tablet Take 1 tablet by mouth three (3) times a day (at 6am, noon and 6pm).     ???  HYDROcodone-homatropine (HYCODAN) 5-1.5 mg/5 mL syrup Take 5 mL by mouth every four (4) hours as needed for cough. 120 mL 0   ??? LC PLUS Misc Provide 1 device  for use with inhaled medication. 1 each 11   ??? linaclotide (LINZESS ORAL) Take 145 mg by mouth daily.     ??? mucus clearing device Devi Use Acapella twice daily for airway clearance. Dx. 494.0. 1 Device 0   ??? omeprazole (PRILOSEC) 20 MG capsule Take 20 mg by mouth Two (2) times a day.      ??? sodium chloride 7% 7 % Nebu Inhale 4 mL by nebulization 4 (four) times a day. 1440 mL 3   ??? sterile water, PF, Soln USE TO MIX COLISTIN THEN ADD ADDITIONAL OF STERILE WATER FOR INJECTION TO NEBULIZER CUP WITH OF MIXED COLISTIN & NEBULIZE TWICE DAILY. 300 mL 5   ??? syringe with needle 3 mL 20 gauge x 1 1/2 Syrg For use with inhaled antibiotic. 60 each 5   ??? tobramycin, PF, ([name]) 300 mg/5 mL nebulizer solution INHALE CONTENTS OF 1 AMP (300MG ) VIA NEBULIZER EVERY 12 HOURS FOR 28 DAYS ON, THEN 28 DAYS OFF. REFILL: [phone] 280 mL 6   ??? testosterone cypionate (DEPOTESTOTERONE CYPIONATE) 200 mg/mL injection Frequency:QOW   Dosage:0.0     Instructions:  Note:Dose: 200 MG/ML       No current facility-administered medications for this visit.        Allergies  Reviewed at 3:40 PM      Reactions Comment    Gentamicin Other (See Comments) BALANCE ISSUES Tobramycin Other (See Comments) ototoxicity    Morphine Nausea And Vomiting         Social History     Tobacco Use   ??? Smoking status: Never Smoker   ??? Smokeless tobacco: Never Used   Substance Use Topics   ??? Alcohol use: No     Alcohol/week: 0.0 standard drinks   ??? Drug use: No         Objective:   Objective   BP 125/85 (BP Site: L Arm, BP Position: Sitting, BP Cuff Size: Medium)  - Pulse 77  - Temp 37 ??C (98.6 ??F) (Oral)  - Ht 174 cm (5' 8.5)  - Wt 83.2 kg (183 lb 6.4 oz)  - SpO2 93% Comment: Resting on room air - BMI 27.48 kg/m??   General Appearance:   Caucasian male appearing comfortable, non-toxic, in no distress, and stated age.   Eyes:  PERRL, conjunctiva clear, EOM's intact. No drainage.   Nose: Nares normal, septum midline. Congested nasal mucosa.  No drainage or polyps.  No sinus tenderness.   Oropharynx: Moist mucus membranes without lesions or thrush.  Good dentition.  Posterior pharynx clear.   Respiratory:   Decreased breath sounds over right mid to lower lobe consistent with prior resection. Crackles and bronchial breath sounds over left lower lobe. Easy work of breathing without accessory muscle use.     Cardiovascular:  Regular rate and rhythm. S1 and S2 normal. No murmur, rub  or gallop.  Pulses intact and symmetric.  No peripheral cyanosis or edema.   Musculoskeletal: No tenderness or deformity of the chest wall.  Joints normal.   Mild clubbing.   Skin: No rashes, lesions, skin changes.   Heme/Lymph: No cervical, supraclavicular, submandibular, or suprasternal adenopathy.  No bruising or petechiae.   Neurologic: Alert and oriented x3. No focal neurological deficits.  Normal gait.      Diagnostic  Review:   Pulmonary Functions Testing Results:      Measures are consistent with severe airway obstruction. FVC reduced, likely reflects moderate restriction post thoracic surgery. Normal inspiratory limb. Measures are stable.        Chest CT (03/31/16): Images personally reviewed. Status post right middle and right lower lobectomies. Tiny loculated basilar right hydropneumothorax, probably chronic. Moderate cylindrical and varicoid bronchiectasis throughout both lungs, with associated scattered mucoid impaction, tree-in-bud opacities and scattered peribronchovascular and subpleural consolidation in the right upper lobe. 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 Two-vessel coronary atherosclerosis. Nonobstructing right nephrolithiasis.    Cultures   Source Bacterial Culture AFB Smear AFB Culture   04/02/15 Sputum 2+ OPF; 1+ Probably Mucoid Pseudomonas Negative Negative   07/02/15 Sputum 4+ OPF; 3+ Mucoid Pseudomonas; 3+ Smooth Pseudomonas; 1+ Stenotrophomonas Negative Negative   01/23/16 Sputum 2+ OPF; 1+ Mucoid Pseudomonas; 1+ Smooth Pseudomonas - -   03/24/16 Sputum 3+ OPF; 3+ Mucoid Pseudomonas; 3+ Smooth Pseudomonas Negative Overgrown   07/21/16 Sputum 4+ OPF; 4+ Mucoid Pseudomonas; 2+ Smooth Pseudomonas Negative Negative   12/29/16 Sputum 4+ OPF; 3+ Mucoid Pseudomonas; 3+ Smooth Pseudomonas Negative Actinomadura sp   04/06/17 Sputum 3+ OPF; 4+ Mucoid Pseudomonas Negative Negative   07/06/17 Sputum 4+ OPF; 3+ Mucoid Pseudomonas Negative Negative   10/12/17 Sputum 4+ OPF; 3+ Mucoid Pseudomonas Negative Negative     Bronchiectasis Evaluation:  IgG with subclasses:   Lab Results   Component Value Date/Time    IGGT 996 04/28/2012 06:21 AM    IGG1 402 04/28/2012 06:21 AM    IGG2 399 04/28/2012 06:21 AM    IGG3 148.0 (H) 04/28/2012 06:21 AM    IGG4 47.7 04/28/2012 06:21 AM     IgA (09/21/11): 283   IgM (09/21/11): 52   IgE:   Lab Results   Component Value Date/Time    IGE 10.5 01/22/2016 09:53 PM     HIV: No results found for: HIVAGAB    CBC-D:   Lab Results   Component Value Date/Time    HGB 14.1 04/19/2017 05:45 AM    HGB 14.8 03/24/2015 05:31 PM    HCT 43.9 04/19/2017 05:45 AM    HCT 30.8 (L) 08/13/2013 03:57 AM    PLT 140 (L) 04/19/2017 05:45 AM    PLT 292 08/13/2013 03:57 AM    NEUTROABS 3.2 04/19/2017 05:45 AM    NEUTROABS 4.9 06/21/2013 09:31 PM    EOSABS 0.2 04/19/2017 05:45 AM    EOSABS 0.2 06/21/2013 09:31 PM     Alpha-1-Antitrypsin (11/11/2010): MM genotype.    CF testing: Sweat test   Lab Results   Component Value Date/Time    AMTLFT 195 06/17/2012 11:39 AM    AMTRT 254 06/17/2012 11:39 AM    SWCLL 6 06/17/2012 11:39 AM    SWCLR 7 06/17/2012 11:39 AM     Health Care Maintenance:  Most Recent Immunizations   Administered Date(s) Administered   ??? INFLUENZA TIV (TRI) 56MO+ W/ PRESERV (IM) 01/07/2012   ??? INFLUENZA TIV (TRI) PF (IM) 12/09/2012   ??? Influenza Vaccine Quad (IIV4 PF) 20mo+ injectable 12/09/2017   ??? Influenza Virus Vaccine, unspecified formulation 12/01/2016   ??? Influenza Whole 12/21/2008   ??? PNEUMOCOCCAL POLYSACCHARIDE 23 02/07/2016   ??? Pneumococcal Conjugate 13-Valent 04/28/2013   ??? TdaP 03/22/2012

## 2018-01-19 DIAGNOSIS — D7589 Other specified diseases of blood and blood-forming organs: Secondary | ICD-10-CM | POA: Diagnosis not present

## 2018-01-19 DIAGNOSIS — Z23 Encounter for immunization: Secondary | ICD-10-CM | POA: Diagnosis not present

## 2018-01-21 DIAGNOSIS — N393 Stress incontinence (female) (male): Secondary | ICD-10-CM | POA: Diagnosis not present

## 2018-01-21 DIAGNOSIS — M6281 Muscle weakness (generalized): Secondary | ICD-10-CM | POA: Diagnosis not present

## 2018-01-21 DIAGNOSIS — M62838 Other muscle spasm: Secondary | ICD-10-CM | POA: Diagnosis not present

## 2018-01-26 DIAGNOSIS — E538 Deficiency of other specified B group vitamins: Secondary | ICD-10-CM | POA: Diagnosis not present

## 2018-02-02 DIAGNOSIS — E538 Deficiency of other specified B group vitamins: Secondary | ICD-10-CM | POA: Diagnosis not present

## 2018-02-03 NOTE — Unmapped (Signed)
[facility] Refill Coordination Note    Specialty Medication(s) to be Shipped:   CF/Pulmonary: -Nebulized colistin (150mg  vials) and supply kit    Other medication(s) to be shipped: alcohol pads, syringes, neb cup     Jordan [name], DOB: 01-18-1956  Phone: [phone] (home)       All above HIPAA information was verified with patient.     Completed refill call assessment today to schedule patient's medication shipment from the [facility] [phone]).       Specialty medication(s) and dose(s) confirmed: Regimen is correct and unchanged.   Changes to medications: Jordan [name] reports no changes reported at this time.  Changes to insurance: No  Questions for the pharmacist: No    The patient will receive a drug information handout for each medication shipped and additional FDA Medication Guides as required.      DISEASE/MEDICATION-SPECIFIC INFORMATION        N/A    ADHERENCE     Medication Adherence    Patient reported X missed doses in the last month:  0  Specialty Medication:  Colistin          Support network for adherence:  family member                  MEDICARE PART B DOCUMENTATION     Not Applicable    SHIPPING     Shipping address confirmed in Epic.     Delivery Scheduled: Yes, Expected medication delivery date: 11/22 - patient has been off this month and starts back 02/20/18 via UPS or courier.     Medication will be delivered via UPS to the home address in Epic WAM.    Jordan [name]   [facility] Specialty Pharmacist

## 2018-02-09 DIAGNOSIS — E538 Deficiency of other specified B group vitamins: Secondary | ICD-10-CM | POA: Diagnosis not present

## 2018-02-10 MED FILL — LC PLUS MISC: 30 days supply | Qty: 1 | Fill #1 | Status: AC

## 2018-02-10 MED FILL — ALCOHOL PREP PADS: 90 days supply | Qty: 100 | Fill #1

## 2018-02-10 MED FILL — BD LUER-LOK SYRINGE 3 ML 20 GAUGE X 1 1/2": 30 days supply | Qty: 60 | Fill #1 | Status: AC

## 2018-02-10 MED FILL — LC PLUS MISC: 30 days supply | Qty: 1 | Fill #1

## 2018-02-10 MED FILL — BD LUER-LOK SYRINGE 3 ML 20 GAUGE X 1 1/2": 30 days supply | Qty: 60 | Fill #1

## 2018-02-10 MED FILL — COLISTIN (COLISTIMETHATE SODIUM) 150 MG SOLUTION FOR INJECTION: 30 days supply | Qty: 60 | Fill #1 | Status: AC

## 2018-02-10 MED FILL — STERILE WATER FOR INJECTION: 30 days supply | Qty: 300 | Fill #1

## 2018-02-10 MED FILL — STERILE WATER FOR INJECTION: 30 days supply | Qty: 300 | Fill #1 | Status: AC

## 2018-02-10 MED FILL — ALCOHOL PREP PADS: 90 days supply | Qty: 100 | Fill #1 | Status: AC

## 2018-02-10 MED FILL — COLISTIN (COLISTIMETHATE SODIUM) 150 MG SOLUTION FOR INJECTION: 30 days supply | Qty: 60 | Fill #1

## 2018-02-15 MED ORDER — HYDROCODONE-HOMATROPINE 5 MG-1.5 MG/5 ML ORAL SYRUP
ORAL | 0 refills | 0.00000 days | Status: CP | PRN
Start: 2018-02-15 — End: 2018-04-18

## 2018-02-15 NOTE — Unmapped (Signed)
Patient last seen: 01/18/18  Next appt in Pulm clinic: TBD  Med last filled on: 4 weeks ago

## 2018-02-16 DIAGNOSIS — E538 Deficiency of other specified B group vitamins: Secondary | ICD-10-CM | POA: Diagnosis not present

## 2018-02-16 DIAGNOSIS — J0100 Acute maxillary sinusitis, unspecified: Secondary | ICD-10-CM | POA: Diagnosis not present

## 2018-02-16 NOTE — Unmapped (Signed)
Reviewed PDMP. Sent refill for Hycodan to local pharmacy.

## 2018-02-21 NOTE — Unmapped (Signed)
February 21, 2018 3:04 PM  Attempted to reach out for [facility] Clinical Assessment for SSC. LVM.     Alton Revere, PharmD, BCPS, CPP  Clinical Pharmacist Practitioner  [facility]        [facility]: Pharmacist Clinical Assessment Note: Adult Pulmonary    Completed clinical pharmacist assessment today with [name] by phone.    CLINICAL ASSESSMENT     Medication Reconciliation: Reviewed the indication, dose, and frequency of each medication with patient. All medications, allergies, and preferred pharmacy list were updated in EPIC medication profile     Adherence Assessment: Excellent compliance    Drug-drug interactions reviewed: Yes  Interaction identified: No    Side effects: Patient is tolerating medications, no side effects to report.     Counseled patient on the following:   Adherence/missed doses  Cost of medications/cost implications  Doses and administration  Pharmacy contact information    PLAN     -Therapy is appropriate to continue, no changes were made to regimen.  -If applicable, follow up labs are up to date.    FOLLOW-UP:  Next clinic follow-up: 05/17/18  Next pharmacy clinical assessment due (in 6 months): 09/03/18    MEDICATION ACCESS & REFILL COORDINATION     Changes to insurance: No  [facility] continues to manage refill coordination with patient.    Alton Revere, PharmD, BCPS, CPP  Clinical Pharmacist Practitioner  [facility]

## 2018-02-23 DIAGNOSIS — E538 Deficiency of other specified B group vitamins: Secondary | ICD-10-CM | POA: Diagnosis not present

## 2018-02-26 NOTE — Unmapped (Signed)
Patient last seen: 07/06/17  Next appt in Pulm clinic: 05/17/18  Med last filled on: 03/01/2017

## 2018-02-28 DIAGNOSIS — C44519 Basal cell carcinoma of skin of other part of trunk: Secondary | ICD-10-CM | POA: Diagnosis not present

## 2018-02-28 DIAGNOSIS — L812 Freckles: Secondary | ICD-10-CM | POA: Diagnosis not present

## 2018-02-28 DIAGNOSIS — Z85828 Personal history of other malignant neoplasm of skin: Secondary | ICD-10-CM | POA: Diagnosis not present

## 2018-02-28 DIAGNOSIS — L570 Actinic keratosis: Secondary | ICD-10-CM | POA: Diagnosis not present

## 2018-02-28 DIAGNOSIS — D225 Melanocytic nevi of trunk: Secondary | ICD-10-CM | POA: Diagnosis not present

## 2018-02-28 MED ORDER — TOBRAMYCIN 300 MG/5 ML IN 0.225 % SODIUM CHLORIDE FOR NEBULIZATION
Freq: Two times a day (BID) | RESPIRATORY_TRACT | 5 refills | 0.00000 days | Status: CP
Start: 2018-02-28 — End: 2019-02-28
  Filled 2018-04-13: qty 300, 30d supply, fill #2

## 2018-03-02 DIAGNOSIS — E538 Deficiency of other specified B group vitamins: Secondary | ICD-10-CM | POA: Diagnosis not present

## 2018-03-08 NOTE — Unmapped (Signed)
PATIENT IS CURRENTLY ON HIS ON-MONTH OF COLISTIN, HE WILL START TOBRAMYCIN ON JAN 1 AND WILL NOT NEED ANY MORE COLISTIN UNTIL CLOSER TO Apr 23, 2018. RESCHEDULED CALL PER PATIENT REQUEST

## 2018-03-09 DIAGNOSIS — E538 Deficiency of other specified B group vitamins: Secondary | ICD-10-CM | POA: Diagnosis not present

## 2018-03-21 DIAGNOSIS — C61 Malignant neoplasm of prostate: Secondary | ICD-10-CM | POA: Diagnosis not present

## 2018-03-24 DIAGNOSIS — E538 Deficiency of other specified B group vitamins: Secondary | ICD-10-CM | POA: Diagnosis not present

## 2018-03-25 DIAGNOSIS — M6281 Muscle weakness (generalized): Secondary | ICD-10-CM | POA: Diagnosis not present

## 2018-03-25 DIAGNOSIS — M62838 Other muscle spasm: Secondary | ICD-10-CM | POA: Diagnosis not present

## 2018-03-25 DIAGNOSIS — N393 Stress incontinence (female) (male): Secondary | ICD-10-CM | POA: Diagnosis not present

## 2018-03-28 DIAGNOSIS — C61 Malignant neoplasm of prostate: Secondary | ICD-10-CM | POA: Diagnosis not present

## 2018-03-28 DIAGNOSIS — N393 Stress incontinence (female) (male): Secondary | ICD-10-CM | POA: Diagnosis not present

## 2018-03-31 DIAGNOSIS — E538 Deficiency of other specified B group vitamins: Secondary | ICD-10-CM | POA: Diagnosis not present

## 2018-04-07 DIAGNOSIS — E538 Deficiency of other specified B group vitamins: Secondary | ICD-10-CM | POA: Diagnosis not present

## 2018-04-12 NOTE — Unmapped (Signed)
[facility] Refill Coordination Note    Specialty Medication(s) to be Shipped:   CF/Pulmonary: -Nebulized colistin (150mg  vials) and supply kit    Other medication(s) to be shipped: Sterile water, [employer], Syringe, alcohol swabs     Jordan [name], DOB: [birth year]  Phone: [phone] (home)       All above HIPAA information was verified with patient.     Completed refill call assessment today to schedule patient's medication shipment from the [facility] [phone]).       Specialty medication(s) and dose(s) confirmed: Regimen is correct and unchanged.   Changes to medications: [name] reports no changes reported at this time.  Changes to insurance: No  Questions for the pharmacist: No    The patient will receive a drug information handout for each medication shipped and additional FDA Medication Guides as required.      DISEASE/MEDICATION-SPECIFIC INFORMATION        N/A    ADHERENCE     Medication Adherence            Support network for adherence:  family member                  MEDICARE PART B DOCUMENTATION     Not Applicable    SHIPPING     Shipping address confirmed in Epic.     Delivery Scheduled: Yes, Expected medication delivery date: 04/14/18 via UPS or courier.     Medication will be delivered via UPS to the home address in Epic WAM.    Jordan [name]   [facility] Specialty Pharmacist

## 2018-04-13 MED FILL — STERILE WATER FOR INJECTION: 30 days supply | Qty: 300 | Fill #2 | Status: AC

## 2018-04-13 MED FILL — ALCOHOL PREP PADS: 90 days supply | Qty: 100 | Fill #2

## 2018-04-13 MED FILL — LC PLUS MISC: 30 days supply | Qty: 1 | Fill #2

## 2018-04-13 MED FILL — COLISTIN (COLISTIMETHATE SODIUM) 150 MG SOLUTION FOR INJECTION: 30 days supply | Qty: 60 | Fill #2

## 2018-04-13 MED FILL — BD LUER-LOK SYRINGE 3 ML 20 GAUGE X 1 1/2": 30 days supply | Qty: 60 | Fill #2 | Status: AC

## 2018-04-13 MED FILL — COLISTIN (COLISTIMETHATE SODIUM) 150 MG SOLUTION FOR INJECTION: 30 days supply | Qty: 60 | Fill #2 | Status: AC

## 2018-04-13 MED FILL — BD LUER-LOK SYRINGE 3 ML 20 GAUGE X 1 1/2": 30 days supply | Qty: 60 | Fill #2

## 2018-04-13 MED FILL — ALCOHOL PREP PADS: 90 days supply | Qty: 100 | Fill #2 | Status: AC

## 2018-04-13 MED FILL — LC PLUS MISC: 30 days supply | Qty: 1 | Fill #2 | Status: AC

## 2018-04-15 DIAGNOSIS — D7589 Other specified diseases of blood and blood-forming organs: Secondary | ICD-10-CM | POA: Diagnosis not present

## 2018-04-18 MED ORDER — HYDROCODONE-HOMATROPINE 5 MG-1.5 MG/5 ML ORAL SYRUP
ORAL | 0 refills | 0 days | Status: CP | PRN
Start: 2018-04-18 — End: 2018-06-01

## 2018-04-18 NOTE — Unmapped (Signed)
Pt called and left a message request a refill on this medication. He would like it sent to the [facility] in [place].     Thanks!

## 2018-04-19 MED ORDER — CIPROFLOXACIN 750 MG TABLET
ORAL_TABLET | Freq: Two times a day (BID) | ORAL | 0 refills | 0 days | Status: CP
Start: 2018-04-19 — End: 2018-07-06

## 2018-04-21 DIAGNOSIS — E538 Deficiency of other specified B group vitamins: Secondary | ICD-10-CM | POA: Diagnosis not present

## 2018-04-25 MED ORDER — SYMBICORT 160 MCG-4.5 MCG/ACTUATION HFA AEROSOL INHALER
1 refills | 0 days | Status: CP
Start: 2018-04-25 — End: 2018-10-17

## 2018-04-28 DIAGNOSIS — E538 Deficiency of other specified B group vitamins: Secondary | ICD-10-CM | POA: Diagnosis not present

## 2018-05-05 DIAGNOSIS — E538 Deficiency of other specified B group vitamins: Secondary | ICD-10-CM | POA: Diagnosis not present

## 2018-05-13 DIAGNOSIS — E538 Deficiency of other specified B group vitamins: Secondary | ICD-10-CM | POA: Diagnosis not present

## 2018-05-17 ENCOUNTER — Encounter: Admit: 2018-05-17 | Discharge: 2018-05-17 | Payer: MEDICARE

## 2018-05-17 ENCOUNTER — Ambulatory Visit: Admit: 2018-05-17 | Discharge: 2018-05-17 | Payer: MEDICARE | Attending: Internal Medicine | Primary: Internal Medicine

## 2018-05-17 DIAGNOSIS — Z20828 Contact with and (suspected) exposure to other viral communicable diseases: Secondary | ICD-10-CM | POA: Diagnosis not present

## 2018-05-17 DIAGNOSIS — J479 Bronchiectasis, uncomplicated: Secondary | ICD-10-CM | POA: Diagnosis not present

## 2018-05-17 DIAGNOSIS — A498 Other bacterial infections of unspecified site: Secondary | ICD-10-CM | POA: Diagnosis not present

## 2018-05-17 DIAGNOSIS — Z2239 Carrier of other specified bacterial diseases: Secondary | ICD-10-CM | POA: Diagnosis not present

## 2018-05-17 DIAGNOSIS — C61 Malignant neoplasm of prostate: Secondary | ICD-10-CM | POA: Diagnosis not present

## 2018-05-17 MED ORDER — OSELTAMIVIR 75 MG CAPSULE
ORAL_CAPSULE | Freq: Every day | ORAL | 0 refills | 0 days | Status: CP
Start: 2018-05-17 — End: 2018-05-27

## 2018-05-17 NOTE — Unmapped (Signed)
[facility]  [facility]    Assessment:      Patient:Jordan [name] (18-Dec-1955)  Reason for visit: Jordan [name] is a 63 y.o.male who returns for post-hospitalization follow-up of bronchiectasis secondary to unknown cause (idiopathic) with Pseudomonas colonization s/p resection of RML and RLL, resection of right 11 and 12th ribs, and recently diagnosed prostate cancer.  While his spirometry is only down about 100 mL from last visit, his overall trend has been a steady decline.     Plan:      Problem List Items Addressed This Visit        Respiratory    Bronchiectasis (CMS-HCC) - Primary     Bronchiectasis Severity Index Score:    Age 55-69 = 2   BMI 18.5 or higher = 0   FEV1 % predicted 30-49% = 2   Hospitalized for severe exacerbation in past 2 years Yes = 5   Exacerbations in previous year 3 or more = 2   mMRC dyspnea score 2 = On level ground, I walk slower than people of the same age because of breathlessness or have to stop for breath when walking at my own pace for 15 minutes.   Chronic Pseudomonas colonization (at least 2 positive cultures at least 3 months apart within 1 year) Yes = 3   Colonization with other potential pathogenic bacteria (at least 2 positive cultures at least 3 months apart within 1 year) No = 0   Radiologic Severity 3 or more lobes or cystic brx = 1       Total Score 17     BSI score of 9+ = Severe bronchiectasis (1 year outcomes 7.6 - 10.5% mortality, 16.7 - 52.6% hospitalization rate; 4 year outcomes 9.9 - 29.2% mortality, 41.2 - 80.4% hospitalization)    Jordan [name] et al. The Bronchiectasis Severity Index: An International Derivation and Validation Study. AJRCCM, 2013; 189(5): [phone].)         Relevant Medications    sodium chloride 7% nebulizer solution 3 mL    Other Relevant Orders    Flow volume loop    IgE Total    Comprehensive Environmental Panel    Aspergillus Specific Precipitins    Blood Gas, Venous    Flow volume loop Other    Pseudomonas aeruginosa infection      Other Visit Diagnoses     Exposure to influenza        Relevant Medications    oseltamivir (TAMIFLU) 75 MG capsule        Reinforced importance of regular airway clearance.  In bronchiectasis, there is impaired mucus clearance from both the airway changes and from the underlying cause of the bronchiectasis.  This impaired clearance leads to accumulation of mucus, resulting in inflammation, accumulation of bacteria, and worsening damage to the airways.  Therefore, augmentation of airway clearance is critical. Jordan [name] was provided websites with information about bronchiectasis, social forums for patients with bronchiectasis and NTM, and airway clearance techniques    Patient met with the respiratory therapist today to discuss airway clearance methods and trial different percussive Vest options. Patient should bring any new airway clearance devices to the next visit to review technique with the respiratory therapist.  No changes were made to inhaled medications. If Jordan [name] gets that plugged sensation again, Jordan [name] will try manual percussion of his chest.  Jordan [name] will continue to use his Pulmozyme daily and alternating months of inhaled antibiotics.  Jordan [name] would like  to think about which percussive vest Jordan [name] would like me to order.    ?? Jordan [name] has bronchiectasis due to unknown cause as seen on chest CT done on 03/31/16 as well as resistant Mucoid Pseudomonas.  ?? Bronchiectasis has resulted in a chronic cough that has been present for over 6 months as well as 3 exacerbations requiring antibiotic treatment over the past 12 months.  Jordan [name] now has mucus plugs that have nearly obstructed his airway during clearance.  ?? For airway clearance, Jordan [name] has tried and failed: Acapella/Flutter/PEP, Breathing Techniques/Huff Cough, Exercise, Nebulized Hypertonic Saline and Pulmozyme.  ?? These methods failed, are contraindicated, or inappropriate for the following reasons: Did Not Mobilize Secretions and No Skilled Caregiver.  ?? Therefore, it is medically necessary for Jordan [name] to use a percussive Vest for airway clearance.   ?? I have ordered Jordan [name] the SmartVest.    Patient spontaneously expectorated sputum that was sent for bacterial and AFB cultures. These results will help to guide antibiotic therapy in the future during exacerbations.  Unfortunately, the larger volume specimen cup was not labeled and therefore, could not be used for AFB culture. If Jordan [name] has future exacerbations, sputum should be sent for bacterial and AFB cultures. The bacterial culture should be processed like a cystic fibrosis sample given the overlapping pathogens. There are standing orders for these cultures in our system.    I ordered the following labs to further assess his decline in lung function: IgE, ABPA panel, comprehensive environmental panel, and VBG. These can be drawn locally at [facility].    We discussed referral to local pulmonary rehab program.  Jordan [name] would like to think more about this and will let me know if Jordan [name] wants to pursue it. I think Jordan [name] will greatly benefit from this program since bronchiectasis patients have decreased activity levels that do not correlate to FEV1 or disease severity but instead correlate to exercise capacity. As a result of decreased activity, bronchiectasis patients have a reduction in peripheral muscle strength and respiratory muscle strength is related to peripheral muscle strength. Studies have shown that exercise training programs for bronchiectasis patients result in reduced dyspnea, fatigue and exacerbation frequency; a delay in time to first exacerbation; and improvement in cough related quality of life measurements.     Because Jordan [name] has been exposed to someone with influenza, I sent a prescription for Tamiflu at the prophylaxis dosing to his local pharmacy.    Jordan [name] should receive the quadrivalent influenza vaccine this fall. Jordan [name] is up to date on pneumococcal vaccination, having received the PPSV 23 (Pneumovax) on 02/07/16 and PCV13 (Prevnar) on 04/28/13.  A repeat PPSV23 should be given on 02/06/2021 (1 year after PCV13 and 5 years post PPSV23). Jordan [name] is up to date for the Tdap vaccine.    Raymon will return to clinic in 3 months for follow-up with pre-bronchodilator spirometry and sputum cultures.  Jordan [name] will call or send me a message via MyChart if questions or concerns arise before this visit.      Subjective:      HPI: Trevon is a 63 y.o. male who returns for follow-up of bronchiectasis secondary to unknown cause (idiopathic) with Pseudomonas colonization.  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     Respiratory Symptoms:  ?? Cough:  Several times per week, will get plugged while doing clearance. When comes up, it is good size glob that has consistency of slime. Started around 1st of January and has only had twice since completed Cipro. Most of the time, it is thin. Some days has to clean up spit cup twice a day due to large volume.  ?? Nocturnal awakenings: Woke up coughing couple times per week.  ?? Wheezing: present little bit off and on  ?? Chest tightness: improved  ?? Rescue albuterol use: None, already using albuterol 4x/day with airway clearance.  ?? Pleurisy: absent.  ?? Hemoptysis: present. Streaks in sputum in January when sick.  ?? MMRC: 2 = On level ground, I walk slower than people of the same age because of breathlessness or have to stop for breath when walking at my own pace for 15 minutes.    Exacerbations:  ?? Number of exacerbations treated in the past year: 3  ?? Dates of exacerbations: 02/07/17, 04/14/17, 09/27/17, 11/09/17, 04/19/18  ?? Number of hospitalizations for exacerbations in the past 2 years: 1  ?? Dates of hospitalizations: 01/22/16; 04/14/17     Pulmonary Therapies:  ?? Airway clearance: Aerobika 4 times per day and Hypertonic Saline 7% 4 times per day. Pulmozyme daily.  ?? Inhaled antibiotics: Alternating 28 day cycles with [name] and Colistin. Currently on Colistin. No difference in symptoms when on one vs the other.  ?? Inhalers: Symbicort bid with spacer  ?? Chronic antibiotics: Azithromycin  ?? Exercise: Minimal walking due to cold weather.  ?? Pulmonary Rehab: Hasn't ever attended  ?? Supplemental oxygen: Not indicated  ?? NIPPV: Not using. Stopped using CPAP some time ago.    Review of Systems  No F/C/NS. Grandchildren have been at his house and have the flu.  Remainder of a complete review of systems was negative unless mentioned above.    Past Medical History:   Diagnosis Date   ??? Abscess of lung (CMS-HCC) 11/06/2010    Overview:  See CT Chest 10/22/10 12/05/2010 right thoracotomy resection of right middle lobe and resection of right lower lobe abscess  Last Assessment & Plan:  See CT Chest 10/22/10 12/05/2010 right thoracotomy resection of right middle lobe and resection of right lower lobe abscess  Jordan [name] is at risk of recurrent bronchiectasis/ abscess so ok to rx with prn augmentin cycles     ??? Arthritis     back   ??? Biceps tendon tear 2013    left side   ??? Bronchiectasis (CMS-HCC)     chronic psuedomonas infection   ??? Degenerative joint disease of left acromioclavicular joint    ??? GERD (gastroesophageal reflux disease)    ??? Hypertension    ??? Obstructive sleep apnea on CPAP 03/14/13    AHI 33.5, on BiPAP 12/8 based on sleep study 09/2013   ??? Pneumonia 2012    Lung abcess    ??? PONV (postoperative nausea and vomiting)    ??? Prostate cancer (CMS-HCC) 09/09/2017   ??? Rotator cuff injury left   ??? Vertigo      Current Outpatient Medications   Medication Sig Dispense Refill   ??? albuterol 2.5 mg /3 mL (0.083 %) nebulizer solution Inhale 2.5 mg by nebulization Four (4) times a day.     ??? albuterol HFA 90 mcg/actuation inhaler Inhale 2 puffs every four (4) hours as needed for wheezing or shortness of breath. 3 Inhaler 3   ??? alcohol swabs PadM USE AS DIRECTED WITH INHALED ANTIBIOTICS 100 each 5   ???  ascorbic acid (CHEWABLE VITAMIN C) 250 mg Chew Chew 1,000 mg Two (2) times a day.      ??? azithromycin (ZITHROMAX) 250 MG tablet Take 1 tablet (250 mg total) by mouth daily. 30 tablet 11   ??? BENEFIBER, GUAR GUM, ORAL Take 1 Act by mouth Two (2) times a day. Take 1 tablespoon BID     ??? CIALIS 20 mg tablet      ??? ciprofloxacin HCl (CIPRO) 750 MG tablet Take 1 tablet (750 mg total) by mouth Two (2) times a day. 28 tablet 0   ??? colistimethate (COLYMYCIN) 150 mg injection INJECT 2 ML STERILE WATER FOR INJECTION INTO COLISTIN VIAL THEN WITHDRAW 2 ML 60 each 5   ??? dornase alfa (PULMOZYME) 1 mg/mL nebulizer solution Inhale 2.5 mg daily. Use at least 30-60 minutes before airway clearance, or after airway clearance 75 mL 11   ??? ferrous sulfate (IRON, FERROUS SULFATE,) 325 (65 FE) MG tablet Take 325 mg by mouth Two (2) times a day.     ??? HYDROcodone-acetaminophen (NORCO) 7.5-325 mg per tablet Take 1 tablet by mouth three (3) times a day (at 6am, noon and 6pm).     ??? HYDROcodone-homatropine (HYCODAN) 5-1.5 mg/5 mL syrup Take 5 mL by mouth every four (4) hours as needed for cough. 120 mL 0   ??? LC PLUS Misc Provide 1 device  for use with inhaled medication. 1 each 11   ??? linaclotide (LINZESS ORAL) Take 145 mg by mouth daily.     ??? mucus clearing device Devi Use Acapella twice daily for airway clearance. Dx. 494.0. 1 Device 0   ??? omeprazole (PRILOSEC) 20 MG capsule Take 20 mg by mouth Two (2) times a day.      ??? sodium chloride 7% 7 % Nebu Inhale 4 mL by nebulization 4 (four) times a day. 1440 mL 3   ??? sterile water, PF, Soln USE TO MIX COLISTIN THEN ADD ADDITIONAL OF STERILE WATER FOR INJECTION TO NEBULIZER CUP WITH OF MIXED COLISTIN & NEBULIZE TWICE DAILY. 300 mL 5   ??? SYMBICORT 160-4.5 mcg/actuation inhaler INHALE 2 PUFFS TWO (2) TIMES A DAY. 30.6 Inhaler 1   ??? syringe with needle 3 mL 20 gauge x 1 1/2 Syrg For use with inhaled antibiotic. 60 each 5 ??? testosterone cypionate (DEPOTESTOTERONE CYPIONATE) 200 mg/mL injection Frequency:QOW   Dosage:0.0     Instructions:  Note:Dose: 200 MG/ML     ??? tobramycin, PF, ([name]) 300 mg/5 mL nebulizer solution Inhale 5 mL (300 mg total) by nebulization every twelve (12) hours. 280 mL 5     No current facility-administered medications for this visit.        Allergies  Reviewed at 3:19 PM      Reactions Comment    Gentamicin Other (See Comments) BALANCE ISSUES    Tobramycin Other (See Comments) ototoxicity    Morphine Nausea And Vomiting         Social History     Tobacco Use   ??? Smoking status: Never Smoker   ??? Smokeless tobacco: Never Used   Substance Use Topics   ??? Alcohol use: No     Alcohol/week: 0.0 standard drinks   ??? Drug use: No         Objective:   Objective   BP 119/71  - Pulse 87  - Temp 36.7 ??C (98.1 ??F) (Oral)  - Resp 18  - Ht 170 cm (5' 6.93)  - Wt 81.8 kg (  180 lb 6.4 oz)  - SpO2 93%  - BMI 28.31 kg/m??   General Appearance:   Caucasian male appearing comfortable, non-toxic, in no distress, and stated age.   Eyes:  PERRL, conjunctiva clear, EOM's intact. No drainage.   Nose: Nares normal, septum midline. Congested nasal mucosa.  No drainage or polyps.  No sinus tenderness.   Oropharynx: Moist mucus membranes without lesions or thrush.  Good dentition.  Posterior pharynx clear.   Respiratory:   Decreased breath sounds over right mid to lower lobe consistent with prior resection. End inspiratory crackles and squeaks throughout. No wheezing or rhonchi.  Easy work of breathing without accessory muscle use.     Cardiovascular:  Regular rate and rhythm. S1 and S2 normal. No murmur, rub  or gallop.  Pulses intact and symmetric.  No peripheral cyanosis or edema.   Musculoskeletal: No tenderness or deformity of the chest wall.  Joints normal.  Mild clubbing.   Skin: No rashes, lesions, skin changes.   Heme/Lymph: No cervical, supraclavicular, submandibular, or suprasternal adenopathy.  No bruising or petechiae. Neurologic: Alert and oriented x3. No focal neurological deficits.  Normal gait.      Diagnostic Review:   Pulmonary Functions Testing Results:      Measures are consistent with severe airway obstruction. FVC reduced, likely reflects moderate restriction post thoracic surgery.  Measures have steadily decreased as compared to prior.        Chest CT (03/31/16): Images personally reviewed. Status post right middle and right lower lobectomies. Tiny loculated basilar right hydropneumothorax, probably chronic. Moderate cylindrical and varicoid bronchiectasis throughout both lungs, with associated scattered mucoid impaction, tree-in-bud opacities and scattered peribronchovascular and subpleural consolidation in the right upper lobe. 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 Two-vessel coronary atherosclerosis. Nonobstructing right nephrolithiasis.    Cultures   Source Bacterial Culture AFB Smear AFB Culture   04/02/15 Sputum 2+ OPF; 1+ Probably Mucoid PA Negative Negative   07/02/15 Sputum 4+ OPF; 3+ Mucoid PA; 3+ Smooth PA; 1+ Stenotrophomonas Negative Negative   01/23/16 Sputum 2+ OPF; 1+ Mucoid PA; 1+ Smooth PA - -   03/24/16 Sputum 3+ OPF; 3+ Mucoid PA; 3+ Smooth PA Negative Overgrown   07/21/16 Sputum 4+ OPF; 4+ Mucoid PA; 2+ Smooth PA Negative Negative   12/29/16 Sputum 4+ OPF; 3+ Mucoid PA; 3+ Smooth PA Negative Actinomadura sp   04/06/17 Sputum 3+ OPF; 4+ Mucoid PA Negative Negative   07/06/17 Sputum 4+ OPF; 3+ Mucoid PA Negative Negative   10/12/17 Sputum 4+ OPF; 3+ Mucoid PA Negative Negative   01/18/18 Sputum OPF Negative Negative     Bronchiectasis Evaluation:  IgG with subclasses:   Lab Results   Component Value Date/Time    IGGT 996 04/28/2012 06:21 AM    IGG1 402 04/28/2012 06:21 AM    IGG2 399 04/28/2012 06:21 AM    IGG3 148.0 (H) 04/28/2012 06:21 AM IGG4 47.7 04/28/2012 06:21 AM     IgA (09/21/11): 283   IgM (09/21/11): 52   IgE:   Lab Results   Component Value Date/Time    IGE 10.5 01/22/2016 09:53 PM     HIV: No results found for: HIVAGAB    CBC-D:   Lab Results   Component Value Date/Time    HGB 14.1 04/19/2017 05:45 AM    HGB 14.8 03/24/2015 05:31 PM    HCT 43.9 04/19/2017  05:45 AM    HCT 30.8 (L) 08/13/2013 03:57 AM    PLT 140 (L) 04/19/2017 05:45 AM    PLT 292 08/13/2013 03:57 AM    NEUTROABS 3.2 04/19/2017 05:45 AM    NEUTROABS 4.9 06/21/2013 09:31 PM    EOSABS 0.2 04/19/2017 05:45 AM    EOSABS 0.2 06/21/2013 09:31 PM     Alpha-1-Antitrypsin (11/11/2010): MM genotype.    CF testing: Sweat test   Lab Results   Component Value Date/Time    AMTLFT 195 06/17/2012 11:39 AM    AMTRT 254 06/17/2012 11:39 AM    SWCLL 6 06/17/2012 11:39 AM    SWCLR 7 06/17/2012 11:39 AM     Health Care Maintenance:  Most Recent Immunizations   Administered Date(s) Administered   ??? INFLUENZA TIV (TRI) 11MO+ W/ PRESERV (IM) 01/07/2012   ??? INFLUENZA TIV (TRI) PF (IM) 12/09/2012   ??? Influenza Vaccine Quad (IIV4 PF) 89mo+ injectable 12/09/2017   ??? Influenza Virus Vaccine, unspecified formulation 12/01/2016   ??? Influenza Whole 12/21/2008   ??? PNEUMOCOCCAL POLYSACCHARIDE 23 02/07/2016   ??? Pneumococcal Conjugate 13-Valent 04/28/2013   ??? TdaP 03/22/2012

## 2018-05-18 NOTE — Unmapped (Signed)
SmartVest order faxed to [facility] for a 30 day trial.

## 2018-05-18 NOTE — Unmapped (Addendum)
Let me know if you want to do pulmonary rehab.    If you get that plugged feeling, try percussing your chest.    Lab work locally: IgE, environmental allergen panel, ABPA, and VBG    Symptoms of a bronchiectasis exacerbation: 48 hours of at least three of the following:  ?? Increased cough  ?? Change in volume or appearance of sputum  ?? Increased sputum purulence  ?? Worsening shortness of breath and/or exercise tolerance  ?? Fatigue and/or malaise  ?? Coughing up blood (hemoptysis)    If you are experiencing some of these symptoms:  ?? Increase the frequency and/or intensity of your airway clearance  ?? Try to submit a sputum sample for bacterial and AFB cultures (at [facility] or locally)  ?? Reach out to me or your local physician.  ?? If you need antibiotics, recommend treating for 14 days.      Please bring any new airway clearance equipment to your next visit to ensure that you are using and caring for it properly.    Thank you for allowing me to be a part of your care. Please call the clinic with any questions.    Viona Gilmore, MD, MPH  Pulmonary and Critical Care Medicine  [address]  [id number]  [place], [place] [address]    Thank you for your visit to the [facility]. You may receive a survey from [facility] regarding your visit today, and we are eager to use this feedback to improve your experience. Thank you for taking the time to fill it out.    Between appointments, you can reach [place] at these numbers:    For appointments or the Pulmonary Nurse: [phone]  Fax: [phone]    For urgent issues after hours:  Hospital Operator: [phone], ask for Pulmonary Fellow on call    For further information, check out the websites below:    [facility] for Bronchiectasis: [email or web address]    Impact Airway Clearance Education: http://www.impact-be.com    World Bronchiectasis Conference in [place] Patient session October 03, 2016: Videos can be watched here. Bronchiectasis Toolbox (information about airway clearance): DiningCalendar.de    Copy for Individuals with Bronchiectasis and/or NTM through the Bronchiectasis Registry and the COPD Foundation: https://www.mckee-young.org/    Information about Non-tuberculous Mycobacterial Infections:  https://www.ntminfo.org     Interested in clinical trials and other research opportunities?  www.clinicaltrials.gov  The Rare Diseases Clinical Research Network [facility]) Genetic Disorders of  Mucociliary Clearance Consortium [facility]) Contact Registry is a way for patients with disorders of mucociliary clearance (such as bronchiectasis) and their family members to learn about research studies they may be able to join. Participation is completely voluntary and you may choose to withdraw at any time. There is no cost to join the [employer]. For more information or to join the registry please go to the following website:  [email or web address]

## 2018-05-18 NOTE — Unmapped (Signed)
Bronchiectasis Severity Index Score:    Age 64-69 = 2   BMI 18.5 or higher = 0   FEV1 % predicted 30-49% = 2   Hospitalized for severe exacerbation in past 2 years Yes = 5   Exacerbations in previous year 3 or more = 2   mMRC dyspnea score 2 = On level ground, I walk slower than people of the same age because of breathlessness or have to stop for breath when walking at my own pace for 15 minutes.   Chronic Pseudomonas colonization (at least 2 positive cultures at least 3 months apart within 1 year) Yes = 3   Colonization with other potential pathogenic bacteria (at least 2 positive cultures at least 3 months apart within 1 year) No = 0   Radiologic Severity 3 or more lobes or cystic brx = 1       Total Score 17     BSI score of 9+ = Severe bronchiectasis (1 year outcomes 7.6 - 10.5% mortality, 16.7 - 52.6% hospitalization rate; 4 year outcomes 9.9 - 29.2% mortality, 41.2 - 80.4% hospitalization)    [name] et al. The Bronchiectasis Severity Index: An International Derivation and Validation Study. AJRCCM, 2013; 189(5): [id number].)

## 2018-05-18 NOTE — Unmapped (Signed)
Bronchiectasis Clinic Assessment     Airway Clearance: We discussed the Life 2000 and potential benefits for [name].  We also reviewed vest options.  He demo'd the SmartVest and did a 7% HTS SVN.  He was able to produce a sputum sample.  We will be contacting SmartVest for a 30 day trial.

## 2018-05-19 DIAGNOSIS — E538 Deficiency of other specified B group vitamins: Secondary | ICD-10-CM | POA: Diagnosis not present

## 2018-05-19 NOTE — Unmapped (Signed)
Clinic notes e-routed to [facility].

## 2018-05-22 DIAGNOSIS — R42 Dizziness and giddiness: Principal | ICD-10-CM

## 2018-05-22 DIAGNOSIS — Z9889 Other specified postprocedural states: Principal | ICD-10-CM

## 2018-05-22 DIAGNOSIS — I10 Essential (primary) hypertension: Principal | ICD-10-CM

## 2018-05-22 DIAGNOSIS — G4733 Obstructive sleep apnea (adult) (pediatric): Principal | ICD-10-CM

## 2018-05-22 DIAGNOSIS — J852 Abscess of lung without pneumonia: Principal | ICD-10-CM

## 2018-05-22 DIAGNOSIS — J189 Pneumonia, unspecified organism: Principal | ICD-10-CM

## 2018-05-22 DIAGNOSIS — C61 Malignant neoplasm of prostate: Principal | ICD-10-CM

## 2018-05-22 DIAGNOSIS — M1990 Unspecified osteoarthritis, unspecified site: Principal | ICD-10-CM

## 2018-05-22 DIAGNOSIS — K219 Gastro-esophageal reflux disease without esophagitis: Principal | ICD-10-CM

## 2018-05-22 DIAGNOSIS — S46009A Unspecified injury of muscle(s) and tendon(s) of the rotator cuff of unspecified shoulder, initial encounter: Principal | ICD-10-CM

## 2018-05-22 DIAGNOSIS — R112 Nausea with vomiting, unspecified: Principal | ICD-10-CM

## 2018-05-22 DIAGNOSIS — M19012 Primary osteoarthritis, left shoulder: Principal | ICD-10-CM

## 2018-05-22 DIAGNOSIS — J479 Bronchiectasis, uncomplicated: Principal | ICD-10-CM

## 2018-05-22 DIAGNOSIS — S46219A Strain of muscle, fascia and tendon of other parts of biceps, unspecified arm, initial encounter: Principal | ICD-10-CM

## 2018-05-22 DIAGNOSIS — Z9989 Dependence on other enabling machines and devices: Principal | ICD-10-CM

## 2018-05-23 ENCOUNTER — Other Ambulatory Visit (HOSPITAL_COMMUNITY)
Admission: RE | Admit: 2018-05-23 | Discharge: 2018-05-23 | Disposition: A | Discharge status: Home / Self Care | Payer: 59 | Source: Ambulatory Visit | Attending: *Deleted | Admitting: *Deleted

## 2018-05-23 DIAGNOSIS — J479 Bronchiectasis, uncomplicated: Secondary | ICD-10-CM | POA: Insufficient documentation

## 2018-05-23 LAB — BLOOD GAS, VENOUS
Acid-base deficit: 0.2 mmol/L (ref 0.0–2.0)
Bicarbonate: 24.2 mmol/L (ref 20.0–28.0)
O2 Saturation: 77.9 %
Patient temperature: 98.6
pCO2, Ven: 41.5 mmHg — ABNORMAL LOW (ref 44.0–60.0)
pH, Ven: 7.383 (ref 7.250–7.430)
pO2, Ven: 42.1 mmHg (ref 32.0–45.0)

## 2018-05-26 DIAGNOSIS — E538 Deficiency of other specified B group vitamins: Secondary | ICD-10-CM | POA: Diagnosis not present

## 2018-06-01 DIAGNOSIS — J479 Bronchiectasis, uncomplicated: Principal | ICD-10-CM

## 2018-06-02 DIAGNOSIS — E538 Deficiency of other specified B group vitamins: Secondary | ICD-10-CM | POA: Diagnosis not present

## 2018-06-02 LAB — ALLERGENS W/TOTAL IGE AREA 2
Alternaria Alternata IgE: UNDETERMINED kU/L
Aspergillus Fumigatus IgE: 0.13 kU/L — AB
Bermuda Grass IgE: 0.3 kU/L — AB
Cat Dander IgE: <0.1 kU/L
Cedar, Mountain IgE: 0.35 kU/L — AB
Cladosporium Herbarum IgE: <0.1 kU/L
Cockroach, German IgE: 0.21 kU/L — AB
Common Silver Birch IgE: UNDETERMINED kU/L
Cottonwood IgE: 0.3 kU/L — AB
D Farinae IgE: 2.99 kU/L — AB
D Pteronyssinus IgE: 0.2 kU/L — AB
Dog Dander IgE: 0.11 kU/L — AB
Elm, American IgE: UNDETERMINED kU/L
IgE (Immunoglobulin E), Serum: 68 IU/mL (ref 6–495)
Johnson Grass IgE: 0.3 kU/L — AB
Maple/Box Elder IgE: 0.29 kU/L — AB
Mouse Urine IgE: <0.1 kU/L
Oak, White IgE: 0.34 kU/L — AB
Pecan, Hickory IgE: 0.27 kU/L — AB
Penicillium Chrysogen IgE: <0.1 kU/L
Pigweed, Rough IgE: 0.26 kU/L — AB
Ragweed, Short IgE: 0.33 kU/L — AB
Sheep Sorrel IgE Qn: 0.33 kU/L — AB
Timothy Grass IgE: 0.28 kU/L — AB
White Mulberry IgE: 0.24 kU/L — AB

## 2018-06-02 NOTE — Unmapped (Signed)
Refill Request:    Last seen: 05/17/18

## 2018-06-03 MED ORDER — HYDROCODONE-HOMATROPINE 5 MG-1.5 MG/5 ML ORAL SYRUP
ORAL | 0 refills | 0 days | Status: CP | PRN
Start: 2018-06-03 — End: 2018-08-01

## 2018-06-07 LAB — ALLERGENS W/TOTAL IGE AREA 2
Alternaria Alternata IgE: UNDETERMINED kU/L
Aspergillus Fumigatus IgE: 0.13 kU/L — AB
Bermuda Grass IgE: 0.3 kU/L — AB
Cat Dander IgE: <0.1 kU/L
Cedar, Mountain IgE: 0.35 kU/L — AB
Cladosporium Herbarum IgE: <0.1 kU/L
Cockroach, German IgE: 0.21 kU/L — AB
Common Silver Birch IgE: UNDETERMINED kU/L
Cottonwood IgE: 0.3 kU/L — AB
D Farinae IgE: 2.99 kU/L — AB
D Pteronyssinus IgE: 0.2 kU/L — AB
Dog Dander IgE: 0.11 kU/L — AB
Elm, American IgE: UNDETERMINED kU/L
Johnson Grass IgE: 0.3 kU/L — AB
Maple/Box Elder IgE: 0.29 kU/L — AB
Mouse Urine IgE: <0.1 kU/L
Oak, White IgE: 0.34 kU/L — AB
Pecan, Hickory IgE: 0.27 kU/L — AB
Penicillium Chrysogen IgE: <0.1 kU/L
Pigweed, Rough IgE: 0.26 kU/L — AB
Ragweed, Short IgE: 0.33 kU/L — AB
Sheep Sorrel IgE Qn: 0.33 kU/L — AB
Timothy Grass IgE: 0.28 kU/L — AB
White Mulberry IgE: 0.24 kU/L — AB

## 2018-06-07 LAB — MISC LABCORP TEST (SEND OUT)
Labcorp test code: 602628
Labcorp test code: 660092

## 2018-06-08 NOTE — Unmapped (Signed)
[facility] Refill Coordination Note    Specialty Medication(s) to be Shipped:   CF/Pulmonary: -colistin    Other medication(s) to be shipped: BD syringes, neb cup, SWI     Jordan [name], DOB: 06/19/1955  Phone: [phone] (home)       All above HIPAA information was verified with patient.     Completed refill call assessment today to schedule patient's medication shipment from the [facility] [phone]).       Specialty medication(s) and dose(s) confirmed: Regimen is correct and unchanged.   Changes to medications: Jordan [name] reports no changes reported at this time.  Changes to insurance: No  Questions for the pharmacist: No    Confirmed patient received Welcome Packet with first shipment. The patient will receive a drug information handout for each medication shipped and additional FDA Medication Guides as required.       DISEASE/MEDICATION-SPECIFIC INFORMATION        N/A    SPECIALTY MEDICATION ADHERENCE     Medication Adherence    Specialty Medication:  colistin- next cycle dose 4/1  Patient is on additional specialty medications:  No  Patient is on more than two specialty medications:  No  Any gaps in refill history greater than 2 weeks in the last 3 months:  no  Support network for adherence:  family member                colistin 150 mg: 0 days of medicine on hand       SHIPPING     Shipping address confirmed in Epic.     Delivery Scheduled: Yes, Expected medication delivery date: 06/14/2018.     Medication will be delivered via UPS to the home address in Epic [place].    Jordan [name]   [facility] Specialty Technician

## 2018-06-09 DIAGNOSIS — E538 Deficiency of other specified B group vitamins: Secondary | ICD-10-CM | POA: Diagnosis not present

## 2018-06-13 MED FILL — COLISTIN (COLISTIMETHATE SODIUM) 150 MG SOLUTION FOR INJECTION: 30 days supply | Qty: 60 | Fill #3 | Status: AC

## 2018-06-13 MED FILL — LC PLUS MISC: 30 days supply | Qty: 1 | Fill #3

## 2018-06-13 MED FILL — BD LUER-LOK SYRINGE 3 ML 20 GAUGE X 1 1/2": 30 days supply | Qty: 60 | Fill #3

## 2018-06-13 MED FILL — WATER FOR INJECTION, STERILE INJECTION SOLUTION: 30 days supply | Qty: 600 | Fill #3 | Status: AC

## 2018-06-13 MED FILL — LC PLUS MISC: 30 days supply | Qty: 1 | Fill #3 | Status: AC

## 2018-06-13 MED FILL — BD LUER-LOK SYRINGE 3 ML 20 GAUGE X 1 1/2": 30 days supply | Qty: 60 | Fill #3 | Status: AC

## 2018-06-13 MED FILL — COLISTIN (COLISTIMETHATE SODIUM) 150 MG SOLUTION FOR INJECTION: 30 days supply | Qty: 60 | Fill #3

## 2018-06-13 MED FILL — WATER FOR INJECTION, STERILE INJECTION SOLUTION: 30 days supply | Qty: 600 | Fill #3

## 2018-06-15 NOTE — Unmapped (Signed)
Order for a Visivest faxed to [facility].

## 2018-06-16 DIAGNOSIS — E538 Deficiency of other specified B group vitamins: Secondary | ICD-10-CM | POA: Diagnosis not present

## 2018-06-16 NOTE — Unmapped (Signed)
Office note e-routed to [employer].

## 2018-06-16 NOTE — Unmapped (Signed)
VBS order faxed back to [facility] with ICD 10 code and electrically signed signature.     Fax #: [phone]  Phone #: [phone]    Fax confirmation noted.

## 2018-06-23 DIAGNOSIS — E538 Deficiency of other specified B group vitamins: Secondary | ICD-10-CM | POA: Diagnosis not present

## 2018-06-27 NOTE — Unmapped (Signed)
CT scans e-routed to [facility] per their request.

## 2018-06-28 DIAGNOSIS — J479 Bronchiectasis, uncomplicated: Secondary | ICD-10-CM | POA: Diagnosis not present

## 2018-06-28 NOTE — Unmapped (Signed)
[employer] confirmed training and delivery of [employer] vest.

## 2018-06-30 DIAGNOSIS — E538 Deficiency of other specified B group vitamins: Secondary | ICD-10-CM | POA: Diagnosis not present

## 2018-07-06 MED ORDER — COLISTIN (COLISTIMETHATE SODIUM) 150 MG SOLUTION FOR INJECTION
5 refills | 0 days | Status: CP
Start: 2018-07-06 — End: ?
  Filled 2018-08-16: qty 60, 30d supply, fill #0

## 2018-07-06 MED ORDER — WATER FOR INJECTION, STERILE INJECTION SOLUTION
5 refills | 0 days | Status: CP
Start: 2018-07-06 — End: ?
  Filled 2018-08-16: qty 600, 30d supply, fill #0

## 2018-07-06 MED ORDER — ALCOHOL SWABS
5 refills | 0 days | Status: CP
Start: 2018-07-06 — End: 2019-07-06
  Filled 2018-08-16: qty 100, 50d supply, fill #0

## 2018-07-06 MED ORDER — LC PLUS MISC
5 refills | 0 days | Status: CP
Start: 2018-07-06 — End: ?

## 2018-07-06 MED ORDER — SYRINGE WITH NEEDLE 3 ML 20 GAUGE X 1 1/2"
5 refills | 0 days | Status: CP
Start: 2018-07-06 — End: ?
  Filled 2018-08-16: qty 60, 30d supply, fill #0

## 2018-07-06 NOTE — Unmapped (Signed)
[facility] Pharmacist Note: Medication Management/Renewals     Sent medication orders for Jordan [name].    1. Pseudomonas aeruginosa infection  - sterile water, PF, Soln; Use 2mL to mix Colistin,then add additional 2mL to neb cup with 2mL of mixed colistin.Inhale twice daily 28 days on and 28 days off.  Dispense: 60 ampule; Refill: 5  - syringe with needle 3 mL 20 gauge x 1 1/2 Syrg; For use with inhaled antibiotic (Colistin)  Dispense: 60 each; Refill: 5  - colistimethate (COLYMYCIN) 150 mg injection; Inject 2mL sterile water for injection to mix colistin vial, then draw up 2mL (150mg ) and inhale 2 times a day,28 days on and 28 days off.  Dispense: 60 each; Refill: 5  - nebulizers (LC PLUS) Misc; Provide 1 device  for use with inhaled medication.(Colistin)  Dispense: 1 each; Refill: 5  - alcohol swabs PadM; USE AS DIRECTED WITH INHALED ANTIBIOTICS  Dispense: 100 each; Refill: 5    Pharmacy sent to:  [facility]      Anell Barr, PharmD, BCPS, CPP  Clinical Pharmacist Practitioner  [facility]  [facility]  Pager: [phone]

## 2018-07-06 NOTE — Unmapped (Signed)
Jordan [name],  We received this e-refill request for this patient. Please let us know if we need to follow-up with anything.     Thank you

## 2018-07-06 NOTE — Unmapped (Signed)
Open in error

## 2018-07-13 DIAGNOSIS — E785 Hyperlipidemia, unspecified: Secondary | ICD-10-CM | POA: Diagnosis not present

## 2018-07-13 DIAGNOSIS — J479 Bronchiectasis, uncomplicated: Secondary | ICD-10-CM | POA: Diagnosis not present

## 2018-07-13 DIAGNOSIS — I10 Essential (primary) hypertension: Secondary | ICD-10-CM | POA: Diagnosis not present

## 2018-07-14 MED ORDER — DORNASE ALFA 1 MG/ML SOLUTION FOR INHALATION
Freq: Every day | RESPIRATORY_TRACT | 3 refills | 0.00000 days | Status: CP
Start: 2018-07-14 — End: 2019-07-14

## 2018-07-15 DIAGNOSIS — M109 Gout, unspecified: Secondary | ICD-10-CM | POA: Diagnosis not present

## 2018-07-15 DIAGNOSIS — E538 Deficiency of other specified B group vitamins: Secondary | ICD-10-CM | POA: Diagnosis not present

## 2018-07-15 DIAGNOSIS — I10 Essential (primary) hypertension: Secondary | ICD-10-CM | POA: Diagnosis not present

## 2018-07-15 DIAGNOSIS — D649 Anemia, unspecified: Secondary | ICD-10-CM | POA: Diagnosis not present

## 2018-07-15 DIAGNOSIS — C61 Malignant neoplasm of prostate: Secondary | ICD-10-CM | POA: Diagnosis not present

## 2018-07-15 DIAGNOSIS — E785 Hyperlipidemia, unspecified: Secondary | ICD-10-CM | POA: Diagnosis not present

## 2018-08-01 MED ORDER — AZITHROMYCIN 250 MG TABLET
ORAL_TABLET | 11 refills | 0 days | Status: CP
Start: 2018-08-01 — End: ?

## 2018-08-01 MED ORDER — HYDROCODONE-HOMATROPINE 5 MG-1.5 MG/5 ML ORAL SYRUP
ORAL | 0 refills | 0 days | Status: CP | PRN
Start: 2018-08-01 — End: ?

## 2018-08-01 NOTE — Unmapped (Signed)
Hello received e-refill request for this medication:    Patient last seen: 05/17/2018  Next appt in Pulm clinic: 09/27/18  Med last filled on: 08/05/2017    Thank you

## 2018-08-01 NOTE — Unmapped (Signed)
Hello,  Received e-refill request for this medication.     Patient last seen: 05/17/18  Next appt in Pulm clinic: 09/27/18  Med last filled on: 06/03/18    Thank you

## 2018-08-03 MED ORDER — ALBUTEROL SULFATE HFA 90 MCG/ACTUATION AEROSOL INHALER
RESPIRATORY_TRACT | 3 refills | 0.00000 days | Status: CP | PRN
Start: 2018-08-03 — End: ?

## 2018-08-09 NOTE — Unmapped (Signed)
[facility] Clinical Assessment & Refill Coordination Note    Jordan [name], DOB: 09-Sep-1955  Phone: [phone] (home)     All above HIPAA information was verified with patient.     Specialty Medication(s):   CF/Pulmonary: -Nebulized colistin (150mg  vials) and supply kit     Current Outpatient Medications   Medication Sig Dispense Refill   ??? albuterol 2.5 mg /3 mL (0.083 %) nebulizer solution Inhale 2.5 mg by nebulization Four (4) times a day.     ??? albuterol HFA 90 mcg/actuation inhaler Inhale 2 puffs every four (4) hours as needed for wheezing or shortness of breath. 1 Inhaler 3   ??? alcohol swabs PadM USE AS DIRECTED WITH INHALED ANTIBIOTICS 100 each 5   ??? ascorbic acid (CHEWABLE VITAMIN C) 250 mg Chew Chew 1,000 mg Two (2) times a day.      ??? azithromycin (ZITHROMAX) 250 MG tablet TAKE 1 TABLET BY MOUTH EVERY DAY 30 tablet 11   ??? BENEFIBER, GUAR GUM, ORAL Take 1 Act by mouth Two (2) times a day. Take 1 tablespoon BID     ??? CIALIS 20 mg tablet      ??? colistimethate (COLYMYCIN) 150 mg injection Inject 2mL sterile water for injection to mix colistin vial, then draw up 2mL (150mg ) and inhale 2 times a day,28 days on and 28 days off. 60 each 5   ??? dornase alfa (PULMOZYME) 1 mg/mL nebulizer solution Inhale 2.5 mg daily. Use at least 30-60 minutes before airway clearance, or after airway clearance 225 mL 3   ??? ferrous sulfate (IRON, FERROUS SULFATE,) 325 (65 FE) MG tablet Take 325 mg by mouth Two (2) times a day.     ??? HYDROcodone-acetaminophen (NORCO) 7.5-325 mg per tablet Take 1 tablet by mouth three (3) times a day (at 6am, noon and 6pm).     ??? HYDROcodone-homatropine (HYCODAN) 5-1.5 mg/5 mL syrup Take 5 mL by mouth every four (4) hours as needed for cough. 120 mL 0   ??? linaclotide (LINZESS ORAL) Take 145 mg by mouth daily.     ??? nebulizers (LC PLUS) Misc Provide 1 device  for use with inhaled medication.(Colistin) 1 each 5   ??? omeprazole (PRILOSEC) 20 MG capsule Take 20 mg by mouth Two (2) times a day.      ??? sodium chloride 7% 7 % Nebu Inhale 4 mL by nebulization 4 (four) times a day. 1440 mL 3   ??? sterile water, PF, Soln Use 2mL to mix Colistin,then add additional 2mL to neb cup with 2mL of mixed colistin.Inhale twice daily 28 days on and 28 days off. 60 ampule 5   ??? SYMBICORT 160-4.5 mcg/actuation inhaler INHALE 2 PUFFS TWO (2) TIMES A DAY. 30.6 Inhaler 1   ??? syringe with needle 3 mL 20 gauge x 1 1/2 Syrg For use with inhaled antibiotic (Colistin) 60 each 5   ??? tobramycin, PF, ([name]) 300 mg/5 mL nebulizer solution Inhale 5 mL (300 mg total) by nebulization every twelve (12) hours. 280 mL 5     No current facility-administered medications for this visit.         Changes to medications: Darrion reports no changes at this time.    Allergies   Allergen Reactions   ??? Gentamicin Other (See Comments)     BALANCE ISSUES   ??? Tobramycin Other (See Comments)     ototoxicity   ??? Morphine Nausea And Vomiting       Changes  to allergies: No    SPECIALTY MEDICATION ADHERENCE     Colistin 150 mg: 0 days of medicine on hand       Medication Adherence    Patient reported X missed doses in the last month:  0  Specialty Medication:  Colistin 150mg   Patient is on additional specialty medications:  No  Informant:  patient  Support network for adherence:  family member          Specialty medication(s) dose(s) confirmed: Regimen is correct and unchanged.     Are there any concerns with adherence? No    Adherence counseling provided? Not needed    CLINICAL MANAGEMENT AND INTERVENTION      Clinical Benefit Assessment:    Do you feel the medicine is effective or helping your condition? Yes    Clinical Benefit counseling provided? Not needed    Adverse Effects Assessment:    Are you experiencing any side effects? No    Are you experiencing difficulty administering your medicine? No    Quality of Life Assessment:    How many days over the past month did your bronchiectasis  keep you from your normal activities? For example, brushing your teeth or getting up in the morning. 0    Have you discussed this with your provider? Not needed    Therapy Appropriateness:    Is therapy appropriate? Yes, therapy is appropriate and should be continued    DISEASE/MEDICATION-SPECIFIC INFORMATION      N/A    PATIENT SPECIFIC NEEDS     ? Does the patient have any physical, cognitive, or cultural barriers? No    ? Is the patient high risk? No     ? Does the patient require a Care Management Plan? No     ? Does the patient require physician intervention or other additional services (i.e. nutrition, smoking cessation, social work)? No      SHIPPING     Specialty Medication(s) to be Shipped:   CF/Pulmonary: -Nebulized colistin (150mg  vials) and supply kit    Other medication(s) to be shipped: supplise     Changes to insurance: No    Delivery Scheduled: Yes, Expected medication delivery date: 08/17/18.     Medication will be delivered via UPS to the confirmed home address in [facility].    The patient will receive a drug information handout for each medication shipped and additional FDA Medication Guides as required.  Verified that patient has previously received a Conservation officer, historic buildings.    All of the patient's questions and concerns have been addressed.    Julianne Rice   [facility] Specialty Pharmacist

## 2018-08-16 MED FILL — LC PLUS MISC: 30 days supply | Qty: 1 | Fill #0 | Status: AC

## 2018-08-16 MED FILL — LC PLUS MISC: 30 days supply | Qty: 1 | Fill #0

## 2018-08-16 MED FILL — ALCOHOL PREP PADS: 50 days supply | Qty: 100 | Fill #0 | Status: AC

## 2018-08-16 MED FILL — BD LUER-LOK SYRINGE 3 ML 20 GAUGE X 1 1/2": 30 days supply | Qty: 60 | Fill #0 | Status: AC

## 2018-08-16 MED FILL — WATER FOR INJECTION, STERILE INJECTION SOLUTION: 30 days supply | Qty: 600 | Fill #0 | Status: AC

## 2018-08-16 MED FILL — COLISTIN (COLISTIMETHATE SODIUM) 150 MG SOLUTION FOR INJECTION: 30 days supply | Qty: 60 | Fill #0 | Status: AC

## 2018-08-17 MED ORDER — CIPROFLOXACIN 750 MG TABLET
ORAL_TABLET | Freq: Two times a day (BID) | ORAL | 0 refills | 0 days | Status: CP
Start: 2018-08-17 — End: ?

## 2018-08-17 NOTE — Unmapped (Signed)
Hello,  Patient did phone back to the clinic.     He states that he has just been more congested than normal. He is coughing more at night when trying to sleep.    He reports that antibiotics had helped him in past.     Thank you

## 2018-09-26 ENCOUNTER — Encounter: Admit: 2018-09-26 | Discharge: 2018-09-27 | Payer: MEDICARE

## 2018-09-26 ENCOUNTER — Ambulatory Visit: Admit: 2018-09-26 | Discharge: 2018-09-27 | Payer: MEDICARE

## 2018-09-26 DIAGNOSIS — J479 Bronchiectasis, uncomplicated: Principal | ICD-10-CM

## 2018-09-27 ENCOUNTER — Encounter: Admit: 2018-09-27 | Discharge: 2018-09-28 | Payer: MEDICARE | Attending: Internal Medicine | Primary: Internal Medicine

## 2018-09-27 ENCOUNTER — Encounter: Admit: 2018-09-27 | Discharge: 2018-09-28 | Payer: MEDICARE

## 2018-09-27 DIAGNOSIS — R1310 Dysphagia, unspecified: Secondary | ICD-10-CM

## 2018-09-27 DIAGNOSIS — J479 Bronchiectasis, uncomplicated: Principal | ICD-10-CM

## 2018-09-27 DIAGNOSIS — R0609 Other forms of dyspnea: Secondary | ICD-10-CM

## 2018-09-27 DIAGNOSIS — J3089 Other allergic rhinitis: Secondary | ICD-10-CM

## 2018-09-27 MED ORDER — MONTELUKAST 10 MG TABLET
ORAL_TABLET | Freq: Every evening | ORAL | 2 refills | 0 days | Status: CP
Start: 2018-09-27 — End: 2019-09-27

## 2018-09-27 NOTE — Unmapped (Signed)
[facility] Pulmonary Diseases and Critical Care Medicine  Video Telehealth Visit Note    09/27/18    2:45 PM  -  Assessment:      Patient:Jordan [name] (1956/01/14)    Mr. Lolli is a 63 y.o. male who is seen in a video telehealth visit for bronchiectasis follow up. His two major complaints are poor exercise tolerance and plugging of mucus causing sensation of drowning. He also reports some dysphagia, which was noted previously during a hospitalization.        Plan:      Problem List Items Addressed This Visit        Digestive    Dysphagia    Relevant Orders    AMB REFERRAL TO SPEECH THERAPY EVAL AND TREAT       Other    Bronchiectasis (CMS-HCC) - Primary    Relevant Medications    sodium chloride 10 % Nebu      Other Visit Diagnoses     Non-seasonal allergic rhinitis due to other allergic trigger        Relevant Medications    montelukast (SINGULAIR) 10 mg tablet        To improve his airway clearance, I am increasing his hypertonic saline concentration to 10%. I am hopeful this will reduce his mucus plugging.  Since his IgE has increased and symptoms seem to be linked to allergens, I am starting him on a trial of Singulair.    While discontinuation of his testosterone supplementation may be playing a role in his dyspnea, Ziggy has severe airway obstruction with moderate restriction secondary to his bronchiectasis and resections. I think he would greatly benefit from the Life2000 device, which provides a form of volume support via nasal pillows and is portable.    To further evaluate his swallow, I have ordered a FEES to be done at [facility].    Return in about 3 months (around 12/28/2018) for Recheck.    The above plan was discussed with the patient and he is in agreement.       Subjective:      Patient's primary  pulmonologist: Garner Nash  Patient's physical location during visit [facility], State): [place], [place]  Patient's call back number: [phone]    Name/Relationship of other individuals with the patient in the room during video telehealth visit: None  I have confirmed that the patient requests and consents to having the above listed persons stay in the patient's room during the video telehealth visit.    The patient was advised that the provider must close the video visit first when visit is completed.    HPI: Mr. Moskal is a 63 y.o. male who is seen in a video telehealth visit for follow up of bronchiectasis.    I last saw him on 05/17/18. He reached out at the end of May reporting symptoms c/w an exacerbation. I treated him with a two week course of Ciprofloxacin, which seemed to help.    Reports that he feels winded too much - if walks up incline or up a couple hundred feet. This has worsened over the past year. Has to stop and let lungs catch up.  Of note, he has been off testosterone supplementation for the past year after he was diagnosed with prostate cancer.    He received the [facility], which he is using with nebulized HTS 7% and Pulmozyme. After using the Vest for 6-8 weeks, he noticed improvement with more phlegm mobilization. Sometimes has to pause it to cough  to get stuff up.  Plugging feeling happening a lot more, especially with the vest.  Most of the time, sputum is thinned out with Pulmozyme but still gets those plugs.    Had to stop eating ice cream or drinking milk shake - will cough will eating and that makes him vomit on occasion. Past few weeks, when eating will cough, no matter what he eats.  Happens almost once a day. Doesn't feel like things get stuck typically.     Past Medical History:   Diagnosis Date   ??? Abscess of lung (CMS-HCC) 11/06/2010    CT Chest 10/22/10 12/05/2010 right thoracotomy resection of right middle lobe and resection of right lower lobe abscess    ??? Arthritis     back   ??? Biceps tendon tear 2013    left side   ??? Bronchiectasis (CMS-HCC)     chronic psuedomonas infection   ??? Degenerative joint disease of left acromioclavicular joint    ??? GERD (gastroesophageal reflux disease)    ??? Hypertension    ??? Obstructive sleep apnea on CPAP 03/14/13    AHI 33.5, on BiPAP 12/8 based on sleep study 09/2013   ??? Pneumonia 2012    Lung abcess    ??? PONV (postoperative nausea and vomiting)    ??? Prostate cancer (CMS-HCC) 09/09/2017   ??? Rotator cuff injury left   ??? Vertigo        Past Surgical History:   Procedure Laterality Date   ??? APPENDECTOMY  1969   ??? BACK SURGERY     ??? BRONCHOSCOPY  10/19/2010    [facility]   ??? CARPAL TUNNEL RELEASE  1999   ??? LUNG REMOVAL, PARTIAL Right 10/2010    RML and RLL partially resected   ??? MENISCECTOMY  2011   ??? NECK SURGERY  1993, 1999, 2002   ??? PR GERD TST W/ MUCOS IMPEDE ELECTROD,>1HR N/A 07/06/2012    Procedure: ESOPHAGEAL FUNCTION TEST, GASTROESOPHAGEAL REFLUX TEST W/ NASAL CATHETER INTRALUMINAL IMPEDANCE ELECTRODE(S) PLACEMENT, RECORDING, ANALYSIS AND INTERPRETATION; PROLONGED;  Surgeon: None None;  [facility];  Service: Gastroenterology   ??? PR OPEN TREAT RIB FRACTURE W/INT FIXATION, UNILATERAL, 1-2 RIBS Right 03/19/2015    Procedure: OPEN TREATMENT OF RIB FRACTURE REQUIRING INTERNAL FIXATION, UNILATERAL; 1-2 RIBS;  Surgeon: Evert Kohl, MD;  Location: MAIN OR [facility];  Service: Cardiothoracic   ??? PR THORACOTOMY W/THERAP WEDGE RESEXN ADDL IPSILATRL Right 08/07/2013    Procedure: THORACOTOMY; WITH THERAPEUTIC LOBECTOMY OF RIGHT MIDDLE AND RIGHT LOWER LOBE RESECTION , EACH ADDITIONAL RESECTION, IPSILATERAL;  Surgeon: Alvester Chou, MD;  Location: MAIN OR [facility];  Service: Cardiothoracic   ??? TONSILLECTOMY  1965       Family History   Problem Relation Age of Onset   ??? Alzheimer's disease Mother    ??? Stroke Mother    ??? Bronchiectasis  Brother         presumed   ??? Bronchiectasis  Brother         presumed   ??? Liver disease Father    ??? Diabetes Father    ??? Heart failure Father    ??? Bronchiectasis  Daughter    ??? Asthma Son         childhood asthma   ??? Clotting disorder Neg Hx    ??? Anesthesia problems Neg Hx        Social History     Tobacco Use ??? Smoking status: Never Smoker   ??? Smokeless  tobacco: Never Used   Substance Use Topics   ??? Alcohol use: No     Alcohol/week: 0.0 standard drinks   ??? Drug use: No       Allergies  Reviewed at 2:19 PM      Reactions Comment    Gentamicin Other (See Comments) BALANCE ISSUES    Tobramycin Other (See Comments) ototoxicity    Morphine Nausea And Vomiting           Current Outpatient Medications   Medication Sig Dispense Refill   ??? albuterol 2.5 mg /3 mL (0.083 %) nebulizer solution Inhale 2.5 mg by nebulization Four (4) times a day.     ??? albuterol HFA 90 mcg/actuation inhaler Inhale 2 puffs every four (4) hours as needed for wheezing or shortness of breath. 1 Inhaler 3   ??? alcohol swabs PadM USE AS DIRECTED WITH INHALED ANTIBIOTICS 100 each 5   ??? ascorbic acid (CHEWABLE VITAMIN C) 250 mg Chew Chew 1,000 mg Two (2) times a day.      ??? azithromycin (ZITHROMAX) 250 MG tablet TAKE 1 TABLET BY MOUTH EVERY DAY 30 tablet 11   ??? BENEFIBER, GUAR GUM, ORAL Take 1 Act by mouth Two (2) times a day. Take 1 tablespoon BID     ??? CIALIS 20 mg tablet      ??? ciprofloxacin HCl (CIPRO) 750 MG tablet Take 1 tablet (750 mg total) by mouth Two (2) times a day. 28 tablet 0   ??? colistimethate (COLYMYCIN) 150 mg injection Inject 2mL sterile water for injection to mix colistin vial, then draw up 2mL (150mg ) and inhale 2 times a day,28 days on and 28 days off. 60 each 5   ??? dornase alfa (PULMOZYME) 1 mg/mL nebulizer solution Inhale 2.5 mg daily. Use at least 30-60 minutes before airway clearance, or after airway clearance 225 mL 3   ??? ferrous sulfate (IRON, FERROUS SULFATE,) 325 (65 FE) MG tablet Take 325 mg by mouth Two (2) times a day.     ??? HYDROcodone-acetaminophen (NORCO) 7.5-325 mg per tablet Take 1 tablet by mouth three (3) times a day (at 6am, noon and 6pm).     ??? HYDROcodone-homatropine (HYCODAN) 5-1.5 mg/5 mL syrup Take 5 mL by mouth every four (4) hours as needed for cough. 120 mL 0   ??? linaclotide (LINZESS ORAL) Take 145 mg by mouth daily.     ??? nebulizers (LC PLUS) Misc Provide 1 device  for use with inhaled medication.(Colistin) 1 each 5   ??? omeprazole (PRILOSEC) 20 MG capsule Take 20 mg by mouth Two (2) times a day.      ??? sterile water, PF, Soln Use 2mL to mix Colistin,then add additional 2mL to neb cup with 2mL of mixed colistin.Inhale twice daily 28 days on and 28 days off. 600 mL 5   ??? SYMBICORT 160-4.5 mcg/actuation inhaler INHALE 2 PUFFS TWO (2) TIMES A DAY. 30.6 Inhaler 1   ??? syringe with needle 3 mL 20 gauge x 1 1/2 Syrg For use with inhaled antibiotic (Colistin) 60 each 5   ??? tobramycin, PF, ([name]) 300 mg/5 mL nebulizer solution Inhale 5 mL (300 mg total) by nebulization every twelve (12) hours. 280 mL 5     No current facility-administered medications for this visit.      Physical Exam (as applicable to extent possible): Well appearing white male in no acute distress, in good spirits. Easy work of breathing without accessory muscle use. Easily speaking in full sentences. No  wheezing or coughing.    Diagnostic Review:   The following data were reviewed during this video telehealth visit with key findings summarized below:    Pulmonary Function Testing:       Cultures:  ?? Source Bacterial Culture AFB Smear AFB Culture   04/02/15 Sputum 2+ OPF; 1+ Probably Mucoid PsA Negative Negative   07/02/15 Sputum 4+ OPF; 3+ Mucoid PsA; 3+ Smooth PsA; 1+ Stenotrophomonas Negative Negative   01/23/16 Sputum 2+ OPF; 1+ Mucoid PsA; 1+ Smooth PsA - -   03/24/16 Sputum 3+ OPF; 3+ Mucoid PsA; 3+ Smooth PsA Negative Overgrown   07/21/16 Sputum 4+ OPF; 4+ Mucoid PsA; 2+ Smooth PsA Negative Negative   12/29/16 Sputum 4+ OPF; 3+ Mucoid PsA; 3+ Smooth PsA Negative Actinomadura sp   04/06/17 Sputum 3+ OPF; 4+ Mucoid PsA Negative Negative   07/06/17 Sputum 4+ OPF; 3+ Mucoid PsA Negative Negative   10/12/17 Sputum 4+ OPF; 3+ Mucoid PsA Negative Negative   01/18/18 Sputum OPF Negative Negative   05/17/18 Sputum 4+ OPF; 3+ Mucoid PsA - -   09/26/18 Sputum pending pending pending   ??  Laboratory Data:  05/23/18:           Lab Results   Component Value Date    WBC 5.4 04/19/2017    HGB 14.1 04/19/2017    HCT 43.9 04/19/2017    PLT 140 (L) 04/19/2017       Chemistry        Component Value Date/Time    NA 140 04/19/2017 0545    NA 137 03/24/2015 1731    K 4.3 04/19/2017 0545    K 3.9 03/24/2015 1731    CL 106 04/19/2017 0545    CL 93 (L) 08/13/2013 0357    CO2 26.0 04/19/2017 0545    CO2 32 (H) 08/13/2013 0357    BUN 22 (H) 04/19/2017 0545    BUN 24 (H) 08/13/2013 0357    CREATININE 1.21 04/19/2017 0545    CREATININE 0.4 (L) 06/12/2014 0930    GLU 92 04/19/2017 0545        Component Value Date/Time    CALCIUM 8.9 04/19/2017 0545    CALCIUM 8.9 08/13/2013 0357    ALKPHOS 73 04/14/2017 2122    ALKPHOS 75 06/21/2013 2131    AST 19 04/14/2017 2122    AST 16 (L) 06/21/2013 2131    ALT 19 04/14/2017 2122    ALT 36 06/21/2013 2131    BILITOT 0.6 04/14/2017 2122    BILITOT 0.4 06/21/2013 2131        Imaging  Chest CT (03/31/16): Images personally reviewed. Status post right middle and right lower lobectomies. Tiny loculated basilar right hydropneumothorax, probably chronic. Moderate cylindrical and varicoid bronchiectasis throughout both lungs, with associated scattered mucoid impaction, tree-in-bud opacities and scattered peribronchovascular and subpleural consolidation in the right upper lobe. 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 Two-vessel coronary atherosclerosis. Nonobstructing right nephrolithiasis.    I spent 13 minutes on the real-time audio and video and another 28 minutes on the phone with the patient. I spent an additional 10 minutes on pre- and post-visit activities.     The patient was physically located in [place] or a state in which I am permitted to provide care. The patient and/or parent/guardian understood that s/he may incur co-pays and cost sharing, and agreed to the telemedicine visit.  The visit was reasonable and appropriate under the circumstances given the patient's presentation at the time.    The patient and/or parent/guardian has been advised of the potential risks and limitations of this mode of treatment (including, but not limited to, the absence of in-person examination) and has agreed to be treated using telemedicine. The patient's/patient's family's questions regarding telemedicine have been answered.     If the visit was completed in an ambulatory setting, the patient and/or parent/guardian has also been advised to contact their provider???s office for worsening conditions, and seek emergency medical treatment and/or call 911 if the patient deems either necessary.      Portions of this record have been created using Scientist, clinical (histocompatibility and immunogenetics). Dictation errors have been sought, but may not have been identified and corrected.    Viona Gilmore, MD  09/27/18  2:45 PM    Billing Guidance: [phone] (New Patients), [phone] (Consults), [phone] (Established Patients)

## 2018-09-28 MED ORDER — SODIUM CHLORIDE 10 % FOR NEBULIZATION
Freq: Two times a day (BID) | RESPIRATORY_TRACT | 2 refills | 90.00000 days | Status: CP
Start: 2018-09-28 — End: 2019-03-30
  Filled 2018-10-03: qty 900, 30d supply, fill #0

## 2018-09-28 NOTE — Unmapped (Signed)
[facility] Refill Coordination Note    Colistin start Aug 1, next date is Oct 1     Specialty Medication(s) to be Shipped:   CF/Pulmonary: -Nebulized colistin (150mg  vials) and supply kit    Other medication(s) to be shipped: HTS 10% , [employer], DOB: 02-15-1956  Phone: [phone] (home)       All above HIPAA information was verified with patient.     Completed refill call assessment today to schedule patient's medication shipment from the [facility] [phone]).       Specialty medication(s) and dose(s) confirmed: Regimen is correct and unchanged.,   Changes to medications: [name] reports no changes at this time.  Changes to insurance: No  Questions for the pharmacist: No    Confirmed patient received Welcome Packet with first shipment. The patient will receive a drug information handout for each medication shipped and additional FDA Medication Guides as required.       DISEASE/MEDICATION-SPECIFIC INFORMATION        N/A    SPECIALTY MEDICATION ADHERENCE     Medication Adherence    Patient reported X missed doses in the last month:  0  Specialty Medication:  colistin 150mg   Patient is on additional specialty medications:  No  Informant:  patient  Support network for adherence:  family member            colistin 150 mg: 0 days of medicine on hand       SHIPPING     Shipping address confirmed in Epic.     Delivery Scheduled: Yes, Expected medication delivery date: 10/04/2018.     Medication will be delivered via UPS to the home address in Epic WAM.    Jordan [name]   [facility] Specialty Pharmacist

## 2018-10-03 MED FILL — LC PLUS MISC: 30 days supply | Qty: 2 | Fill #1

## 2018-10-03 MED FILL — BD LUER-LOK SYRINGE 3 ML 20 GAUGE X 1 1/2": 30 days supply | Qty: 60 | Fill #1 | Status: AC

## 2018-10-03 MED FILL — LC PLUS MISC: 30 days supply | Qty: 2 | Fill #1 | Status: AC

## 2018-10-03 MED FILL — SODIUM CHLORIDE 10 % FOR NEBULIZATION: 30 days supply | Qty: 900 | Fill #0 | Status: AC

## 2018-10-03 MED FILL — WATER FOR INJECTION, STERILE INJECTION SOLUTION: 30 days supply | Qty: 600 | Fill #1

## 2018-10-03 MED FILL — WATER FOR INJECTION, STERILE INJECTION SOLUTION: 30 days supply | Qty: 600 | Fill #1 | Status: AC

## 2018-10-03 MED FILL — COLISTIN (COLISTIMETHATE SODIUM) 150 MG SOLUTION FOR INJECTION: 30 days supply | Qty: 60 | Fill #1

## 2018-10-03 MED FILL — BD LUER-LOK SYRINGE 3 ML 20 GAUGE X 1 1/2": 30 days supply | Qty: 60 | Fill #1

## 2018-10-03 MED FILL — COLISTIN (COLISTIMETHATE SODIUM) 150 MG SOLUTION FOR INJECTION: 30 days supply | Qty: 60 | Fill #1 | Status: AC

## 2018-10-03 MED FILL — ALCOHOL SWABS: 50 days supply | Qty: 100 | Fill #1

## 2018-10-03 MED FILL — ALCOHOL SWABS: 50 days supply | Qty: 100 | Fill #1 | Status: AC

## 2018-10-11 NOTE — Unmapped (Signed)
Increasing HTS to 10%. Sent script to [facility].    Starting Singulair.    Working on getting you the Life2000.    Referral placed to [facility] Speech Therapy.    Symptoms of a bronchiectasis exacerbation: 48 hours of at least three of the following:  ?? Increased cough  ?? Change in volume or appearance of sputum  ?? Increased sputum purulence  ?? Worsening shortness of breath and/or exercise tolerance  ?? Fatigue and/or malaise  ?? Coughing up blood (hemoptysis)    If you are experiencing some of these symptoms:  ?? Increase the frequency and/or intensity of your airway clearance  ?? Try to submit a sputum sample for bacterial and AFB cultures (at [facility] or locally)  ?? Reach out to me or your local physician.  ?? If you need antibiotics, recommend treating for 14 days.      Please bring any new airway clearance equipment to your next visit to ensure that you are using and caring for it properly.    I recommend receiving the standard dose quadrivalent influenza vaccine this fall.     Thank you for allowing me to be a part of your care. Please call the clinic with any questions.    Viona Gilmore, MD, MPH  Pulmonary and Critical Care Medicine  [address]  [id number]  [place], [place] [address]    Thank you for your visit to the [facility]. You may receive a survey from [facility] regarding your visit today, and we are eager to use this feedback to improve your experience. Thank you for taking the time to fill it out.    Between appointments, you can reach [place] at these numbers:    For appointments or the Pulmonary Nurse: [phone]  Fax: [phone]    For urgent issues after hours:  Hospital Operator: [phone], ask for Pulmonary Fellow on call    For further information, check out the websites below:    [facility] for Bronchiectasis: [email or web address]    Impact Airway Clearance Education: http://www.impact-be.com    World Bronchiectasis Conference in [place] Patient session October 03, 2016: Videos can be watched here.     Bronchiectasis Toolbox (information about airway clearance): DiningCalendar.de    Copy for Individuals with Bronchiectasis and/or NTM through the Bronchiectasis Registry and the COPD Foundation: https://www.mckee-young.org/    Information about Non-tuberculous Mycobacterial Infections:  https://www.ntminfo.org     Interested in clinical trials and other research opportunities?  www.clinicaltrials.gov  The Rare Diseases Clinical Research Network [facility]) Genetic Disorders of  Mucociliary Clearance Consortium [facility]) Contact Registry is a way for patients with disorders of mucociliary clearance (such as bronchiectasis) and their family members to learn about research studies they may be able to join. Participation is completely voluntary and you may choose to withdraw at any time. There is no cost to join the [employer]. For more information or to join the registry please go to the following website:  [email or web address]

## 2018-10-13 NOTE — Unmapped (Signed)
Faxed Rx order for Life 2000 to [facility] Phone: Phone: [phone] with face sheet. Fax receipt confirmation received.

## 2018-10-13 NOTE — Unmapped (Signed)
Clinic progress notes and CT e-routed to [facility].

## 2018-10-17 MED ORDER — SYMBICORT 160 MCG-4.5 MCG/ACTUATION HFA AEROSOL INHALER
1 refills | 0 days | Status: CP
Start: 2018-10-17 — End: ?

## 2018-10-27 NOTE — Unmapped (Signed)
The documentation has been approved for Life 2000 patient Jordan [name]. I will keep you updated on his training.     Please let me know if you have any questions.       Sincerely,  Toney Rakes  Account Executive   [facility]   Respiratory Health Division     [phone] mobile  [phone] office   [phone] fax   Samara [email or web address]

## 2018-12-01 DIAGNOSIS — J479 Bronchiectasis, uncomplicated: Secondary | ICD-10-CM

## 2018-12-01 MED ORDER — ALBUTEROL SULFATE HFA 90 MCG/ACTUATION AEROSOL INHALER
3 refills | 0 days | Status: CP
Start: 2018-12-01 — End: ?

## 2018-12-05 NOTE — Unmapped (Signed)
[facility] Clinical Assessment & Refill Coordination Note    Jordan [name], DOB: [birth year]  Phone: [phone] (home)     All above HIPAA information was verified with patient.     Specialty Medication(s):   CF/Pulmonary: -Nebulized colistin (150mg  vials) and supply kit     Current Outpatient Medications   Medication Sig Dispense Refill   ??? albuterol 2.5 mg /3 mL (0.083 %) nebulizer solution Inhale 2.5 mg by nebulization Four (4) times a day.     ??? albuterol HFA 90 mcg/actuation inhaler INHALE 2 PUFFS INTO LUNGS EVERY 4 HOURS AS NEEDED FOR WHEEZING OR SHORTNESS OF BREATH. 6.7 Inhaler 3   ??? alcohol swabs PadM USE AS DIRECTED WITH INHALED ANTIBIOTICS 100 each 5   ??? ascorbic acid (CHEWABLE VITAMIN C) 250 mg Chew Chew 1,000 mg Two (2) times a day.      ??? azithromycin (ZITHROMAX) 250 MG tablet TAKE 1 TABLET BY MOUTH EVERY DAY 30 tablet 11   ??? BENEFIBER, GUAR GUM, ORAL Take 1 Act by mouth Two (2) times a day. Take 1 tablespoon BID     ??? CIALIS 20 mg tablet      ??? ciprofloxacin HCl (CIPRO) 750 MG tablet Take 1 tablet (750 mg total) by mouth Two (2) times a day. 28 tablet 0   ??? colistimethate (COLYMYCIN) 150 mg injection Inject 2mL sterile water for injection to mix colistin vial, then draw up 2mL (150mg ) and inhale 2 times a day,28 days on and 28 days off. 60 each 5   ??? dornase alfa (PULMOZYME) 1 mg/mL nebulizer solution Inhale 2.5 mg daily. Use at least 30-60 minutes before airway clearance, or after airway clearance 225 mL 3   ??? ferrous sulfate (IRON, FERROUS SULFATE,) 325 (65 FE) MG tablet Take 325 mg by mouth Two (2) times a day.     ??? HYDROcodone-acetaminophen (NORCO) 7.5-325 mg per tablet Take 1 tablet by mouth three (3) times a day (at 6am, noon and 6pm).     ??? HYDROcodone-homatropine (HYCODAN) 5-1.5 mg/5 mL syrup Take 5 mL by mouth every four (4) hours as needed for cough. 120 mL 0   ??? linaclotide (LINZESS ORAL) Take 145 mg by mouth daily.     ??? montelukast (SINGULAIR) 10 mg tablet Take 1 tablet (10 mg total) by mouth nightly. 30 tablet 2   ??? nebulizers (LC PLUS) Misc Provide 1 device  for use with inhaled medication.(Colistin) 1 each 5   ??? omeprazole (PRILOSEC) 20 MG capsule Take 20 mg by mouth Two (2) times a day.      ??? sodium chloride 10 % Nebu Inhale 5 mL by nebulization two (2) times a day for 365 doses. 900 mL 2   ??? sterile water, PF, Soln Use 2mL to mix Colistin,then add additional 2mL to neb cup with 2mL of mixed colistin.Inhale twice daily 28 days on and 28 days off. 600 mL 5   ??? SYMBICORT 160-4.5 mcg/actuation inhaler INHALE 2 PUFFS TWO (2) TIMES A DAY. 30.6 Inhaler 1   ??? syringe with needle 3 mL 20 gauge x 1 1/2 Syrg For use with inhaled antibiotic (Colistin) 60 each 5   ??? tobramycin, PF, ([name]) 300 mg/5 mL nebulizer solution Inhale 5 mL (300 mg total) by nebulization every twelve (12) hours. 280 mL 5     No current facility-administered medications for this visit.         Changes to medications: Levaughn reports no changes at this  time.    Allergies   Allergen Reactions   ??? Gentamicin Other (See Comments)     BALANCE ISSUES   ??? Tobramycin Other (See Comments)     ototoxicity   ??? Morphine Nausea And Vomiting       Changes to allergies: No    SPECIALTY MEDICATION ADHERENCE     colistin 150 mg: 0 days of medicine on hand       Medication Adherence    Support network for adherence: family member          Specialty medication(s) dose(s) confirmed: Regimen is correct and unchanged.     Are there any concerns with adherence? No    Adherence counseling provided? Not needed    CLINICAL MANAGEMENT AND INTERVENTION      Clinical Benefit Assessment:    Do you feel the medicine is effective or helping your condition? Yes    Clinical Benefit counseling provided? Not needed    Adverse Effects Assessment:    Are you experiencing any side effects? No    Are you experiencing difficulty administering your medicine? No    Quality of Life Assessment:    How many days over the past month did your pseudamonas infection  keep you from your normal activities? For example, brushing your teeth or getting up in the morning. 0    Have you discussed this with your provider? Not needed    Therapy Appropriateness:    Is therapy appropriate? Yes, therapy is appropriate and should be continued    DISEASE/MEDICATION-SPECIFIC INFORMATION      N/A    PATIENT SPECIFIC NEEDS     ? Does the patient have any physical, cognitive, or cultural barriers? No    ? Is the patient high risk? No     ? Does the patient require a Care Management Plan? No     ? Does the patient require physician intervention or other additional services (i.e. nutrition, smoking cessation, social work)? No      SHIPPING     Specialty Medication(s) to be Shipped:   CF/Pulmonary: -Nebulized colistin (150mg  vials) and supply kit    Other medication(s) to be shipped: HTS 10%      Changes to insurance: No    Delivery Scheduled: Yes, Expected medication delivery date: 12/15/2018.     Medication will be delivered via UPS to the confirmed home address in [facility].    The patient will receive a drug information handout for each medication shipped and additional FDA Medication Guides as required.  Verified that patient has previously received a Conservation officer, historic buildings.    All of the patient's questions and concerns have been addressed.    Julianne Rice   [facility] Shared [facility] Specialty Pharmacist

## 2018-12-12 ENCOUNTER — Encounter
Admit: 2018-12-12 | Discharge: 2018-12-13 | Payer: MEDICARE | Attending: Speech-Language Pathologist | Primary: Speech-Language Pathologist

## 2018-12-12 DIAGNOSIS — R1310 Dysphagia, unspecified: Secondary | ICD-10-CM

## 2018-12-14 MED FILL — SODIUM CHLORIDE 10 % FOR NEBULIZATION: 30 days supply | Qty: 900 | Fill #1 | Status: AC

## 2018-12-14 MED FILL — ALCOHOL SWABS: 50 days supply | Qty: 100 | Fill #2

## 2018-12-14 MED FILL — LC PLUS MISC: 30 days supply | Qty: 2 | Fill #2

## 2018-12-14 MED FILL — COLISTIN (COLISTIMETHATE SODIUM) 150 MG SOLUTION FOR INJECTION: 30 days supply | Qty: 60 | Fill #2

## 2018-12-14 MED FILL — WATER FOR INJECTION, STERILE INJECTION SOLUTION: 30 days supply | Qty: 600 | Fill #2 | Status: AC

## 2018-12-14 MED FILL — COLISTIN (COLISTIMETHATE SODIUM) 150 MG SOLUTION FOR INJECTION: 30 days supply | Qty: 60 | Fill #2 | Status: AC

## 2018-12-14 MED FILL — LC PLUS MISC: 30 days supply | Qty: 2 | Fill #2 | Status: AC

## 2018-12-14 MED FILL — WATER FOR INJECTION, STERILE INJECTION SOLUTION: 30 days supply | Qty: 600 | Fill #2

## 2018-12-14 MED FILL — ALCOHOL SWABS: 50 days supply | Qty: 100 | Fill #2 | Status: AC

## 2018-12-14 MED FILL — SODIUM CHLORIDE 10 % FOR NEBULIZATION: RESPIRATORY_TRACT | 30 days supply | Qty: 900 | Fill #1

## 2018-12-14 MED FILL — BD LUER-LOK SYRINGE 3 ML 20 GAUGE X 1 1/2": 30 days supply | Qty: 60 | Fill #2

## 2018-12-14 MED FILL — BD LUER-LOK SYRINGE 3 ML 20 GAUGE X 1 1/2": 30 days supply | Qty: 60 | Fill #2 | Status: AC

## 2018-12-21 NOTE — Unmapped (Signed)
Patient has been trained on Life 2000.  Patient finds the device beneficial while functioning within the home.  Patient does not currently have oxygen, so he can not use the Life 2000 outside of the home.  Message sent to Dr. Garner Nash suggesting at patient's next clinic appointment.

## 2018-12-25 DIAGNOSIS — J3089 Other allergic rhinitis: Secondary | ICD-10-CM

## 2018-12-26 MED ORDER — MONTELUKAST 10 MG TABLET
ORAL_TABLET | 0 refills | 0 days | Status: CP
Start: 2018-12-26 — End: ?

## 2018-12-30 NOTE — Unmapped (Signed)
[facility] ADULT SPEECH THERAPY [place]  OUTPATIENT SPEECH PATHOLOGY  12/12/2018    Patient Name: Jordan [name]  Date of Birth:1955-09-16  Session Number: 1  Diagnosis: swallowing disorder     Date of Evaluation: 12/12/18     Referred by: Dr. Romana Juniper  Reason for Referral: Swallow Evaluation      Assessment: SLP donned surgical mask, eye shields and gloves. Patient demonstrated normal oral and peripheral structures.Scars are noted at anterior neck constistent with report of neck surgery which apparently includes a remote history of ACDF. I have been uable to find exact neck surgery done in the EMR.SLP inititated a common language discussion with patient regarding aspiration risk levels for both p.o. intake and for salivary flow with possible chronic desentization processes. While normal oral perirpheral exam is presnt, silent aspiration can be a continual process through salivary misdirection and upper airway bathing of TVCs. Patient was informed regarding sensory and motor mechaisms and overt and covert signs of trouble that can elevate risks. Patient was attentive throughout and asked appropriate questions.He has had ongoig lung/breathing issues for many years per his report. This is consistent with EMR records. Contritubution of remote neck history is not known. Following discussion, SLP initiated clinical evaluation of swallowing. FEES was requested but not available at this time. However, clincally, patient was able to accept and orally manipulate all boluses thin liquid through solid textures. He tended to clear his throat spontaneously with all swallows regardless of texture. Oral phase shows no significant signs of issues and phayngeal onset appears timely. Laryngeal elevation appears okay to palpatient.Exact measurement and analysis is not possible without objective circumstances. However, previous objective studies do not show significant aspiration. However silient laryngeal penetration is frequent. He tended to clear throat with every swallow. Difficult to discern actual consistent laryngeal penetration vs. habituation effect. Patient has not had swallowing therapy to date.He is high risk for microaspiration. No significant diet consistency change is necessary today. Patient has significant history of GI reflux. He reports that he takes omeprazole 20 mL twice daily. He says he continues to get heartburn symptoms despite this. He stated that he does not take the omeprazole on an empty stomach consistently. This was advised. He was dismissed with previously set strategies. Patient expressed satisfaction with visit.    Risk for Aspiration: Mild  Recommendations: Modified barium swallow study  Diet Solids Recommendation: Regular Solids (no restrictions)  Diet Liquids Recommendations: No liquid consistency restrictions  Recommended Form of Medications: (preference)    PLAN:    for      Goals:  Patient and Family Goals: reduce pulmonary risk \    SUBJECTIVE:  Patient is ambulatory with normal cognition. He presents with deep wet cough baseline.    Communication Preference: Verbal         Barriers to Learning: No Barriers                   Services patient receives: SLP         Prior treatment for referral reason: No                  Pain?: No      Precautions: None         Prior Function: Independent                       Past Medical History:   Diagnosis Date   ??? Abscess of lung (CMS-HCC) 11/06/2010  CT Chest 10/22/10 12/05/2010 right thoracotomy resection of right middle lobe and resection of right lower lobe abscess    ??? Arthritis     back   ??? Biceps tendon tear 2013    left side   ??? Bronchiectasis (CMS-HCC)     chronic psuedomonas infection   ??? Degenerative joint disease of left acromioclavicular joint    ??? GERD (gastroesophageal reflux disease)    ??? Hypertension    ??? Obstructive sleep apnea on CPAP 03/14/13    AHI 33.5, on BiPAP 12/8 based on sleep study 09/2013   ??? Pneumonia 2012    Lung abcess    ??? PONV (postoperative nausea and vomiting)    ??? Prostate cancer (CMS-HCC) 09/09/2017   ??? Rotator cuff injury left   ??? Vertigo       Family History   Problem Relation Age of Onset   ??? Alzheimer's disease Mother    ??? Stroke Mother    ??? Bronchiectasis  Brother         presumed   ??? Bronchiectasis  Brother         presumed   ??? Liver disease Father    ??? Diabetes Father    ??? Heart failure Father    ??? Bronchiectasis  Daughter    ??? Asthma Son         childhood asthma   ??? Clotting disorder Neg Hx    ??? Anesthesia problems Neg Hx      Past Surgical History:   Procedure Laterality Date   ??? APPENDECTOMY  1969   ??? BACK SURGERY     ??? BRONCHOSCOPY  10/19/2010    [facility]   ??? CARPAL TUNNEL RELEASE  1999   ??? LUNG REMOVAL, PARTIAL Right 10/2010    RML and RLL partially resected   ??? MENISCECTOMY  2011   ??? NECK SURGERY  1993, 1999, 2002   ??? PR GERD TST W/ MUCOS IMPEDE ELECTROD,>1HR N/A 07/06/2012    Procedure: ESOPHAGEAL FUNCTION TEST, GASTROESOPHAGEAL REFLUX TEST W/ NASAL CATHETER INTRALUMINAL IMPEDANCE ELECTRODE(S) PLACEMENT, RECORDING, ANALYSIS AND INTERPRETATION; PROLONGED;  Surgeon: None None;  [facility];  Service: Gastroenterology   ??? PR OPEN TREAT RIB FRACTURE W/INT FIXATION, UNILATERAL, 1-2 RIBS Right 03/19/2015    Procedure: OPEN TREATMENT OF RIB FRACTURE REQUIRING INTERNAL FIXATION, UNILATERAL; 1-2 RIBS;  Surgeon: Evert Kohl, MD;  Location: MAIN OR [facility];  Service: Cardiothoracic   ??? PR THORACOTOMY W/THERAP WEDGE RESEXN ADDL IPSILATRL Right 08/07/2013    Procedure: THORACOTOMY; WITH THERAPEUTIC LOBECTOMY OF RIGHT MIDDLE AND RIGHT LOWER LOBE RESECTION , EACH ADDITIONAL RESECTION, IPSILATERAL;  Surgeon: Alvester Chou, MD;  Location: MAIN OR [facility];  Service: Cardiothoracic   ??? TONSILLECTOMY  1965      Allergies   Allergen Reactions   ??? Gentamicin Other (See Comments)     BALANCE ISSUES   ??? Tobramycin Other (See Comments)     ototoxicity   ??? Morphine Nausea And Vomiting     Social History Tobacco Use   ??? Smoking status: Never Smoker   ??? Smokeless tobacco: Never Used   Substance Use Topics   ??? Alcohol use: No     Alcohol/week: 0.0 standard drinks      Current Outpatient Medications   Medication Sig Dispense Refill   ??? albuterol 2.5 mg /3 mL (0.083 %) nebulizer solution Inhale 2.5 mg by nebulization Four (4) times a day.     ??? albuterol HFA 90 mcg/actuation inhaler  INHALE 2 PUFFS INTO LUNGS EVERY 4 HOURS AS NEEDED FOR WHEEZING OR SHORTNESS OF BREATH. 6.7 Inhaler 3   ??? alcohol swabs PadM USE AS DIRECTED WITH INHALED ANTIBIOTICS 100 each 5   ??? ascorbic acid (CHEWABLE VITAMIN C) 250 mg Chew Chew 1,000 mg Two (2) times a day.      ??? azithromycin (ZITHROMAX) 250 MG tablet TAKE 1 TABLET BY MOUTH EVERY DAY 30 tablet 11   ??? BENEFIBER, GUAR GUM, ORAL Take 1 Act by mouth Two (2) times a day. Take 1 tablespoon BID     ??? CIALIS 20 mg tablet      ??? ciprofloxacin HCl (CIPRO) 750 MG tablet Take 1 tablet (750 mg total) by mouth Two (2) times a day. 28 tablet 0   ??? colistimethate (COLYMYCIN) 150 mg injection Inject 2mL sterile water for injection to mix colistin vial, then draw up 2mL (150mg ) and inhale 2 times a day,28 days on and 28 days off. 60 each 5   ??? dornase alfa (PULMOZYME) 1 mg/mL nebulizer solution Inhale 2.5 mg daily. Use at least 30-60 minutes before airway clearance, or after airway clearance 225 mL 3   ??? ferrous sulfate (IRON, FERROUS SULFATE,) 325 (65 FE) MG tablet Take 325 mg by mouth Two (2) times a day.     ??? HYDROcodone-acetaminophen (NORCO) 7.5-325 mg per tablet Take 1 tablet by mouth three (3) times a day (at 6am, noon and 6pm).     ??? HYDROcodone-homatropine (HYCODAN) 5-1.5 mg/5 mL syrup Take 5 mL by mouth every four (4) hours as needed for cough. 120 mL 0   ??? linaclotide (LINZESS ORAL) Take 145 mg by mouth daily.     ??? montelukast (SINGULAIR) 10 mg tablet TAKE 1 TABLET BY MOUTH EVERY DAY AT NIGHT 90 tablet 0   ??? nebulizers (LC PLUS) Misc Provide 1 device  for use with inhaled medication.(Colistin) 1 each 5   ??? omeprazole (PRILOSEC) 20 MG capsule Take 20 mg by mouth Two (2) times a day.      ??? sodium chloride 10 % Nebu Inhale 5 mL by nebulization two (2) times a day for 365 doses. 900 mL 2   ??? sterile water, PF, Soln Use 2mL to mix Colistin,then add additional 2mL to neb cup with 2mL of mixed colistin.Inhale twice daily 28 days on and 28 days off. 600 mL 5   ??? SYMBICORT 160-4.5 mcg/actuation inhaler INHALE 2 PUFFS TWO (2) TIMES A DAY. 30.6 Inhaler 1   ??? syringe with needle 3 mL 20 gauge x 1 1/2 Syrg For use with inhaled antibiotic (Colistin) 60 each 5   ??? tobramycin, PF, ([name]) 300 mg/5 mL nebulizer solution Inhale 5 mL (300 mg total) by nebulization every twelve (12) hours. 280 mL 5     No current facility-administered medications for this encounter.          OBJECTIVE  Baseline Assessment  Temperature Spikes Noted: No  Respiratory Status : Room air  History of Intubation: No  Behavior/Cognition: Alert, Cooperative, Pleasant mood  Vision: Functional for self-feeding  Positioning : Upright in chair  Vocal Quality: Normal  Volitional Swallow: Within Functional Limits    Oral/Motor  Labial ROM: Within Functional Limits  Labial Symmetry: Within Functional Limits  Labial Strength: Within Functional Limits  Lingual ROM: Within Functional Limits  Lingual Symmetry: Within Functional Limits  Lingual Strength: Within Functional Limits  Lingual Sensation: Within Functional Limits  Velum: Within Functional Limits  Mandible: Within Functional Limits  Facial  ROM: Within Functional Limits  Facial Symmetry: Within Functional Limits  Facial Strength: Within Functional Limits  Facial Sensation: Within Functional Limits  Vocal Intensity: Within Functional Limits  Gag: (Not tested)  Apraxia: None present  Dysarthria: None present  Intelligibility: Intelligible  Breath Support: Adequate for speech  Dentition: Adequate  Oral Mechanism : normal oral and peripheral    Bolus Trials  Bolus Trials: puree, solid, thin liquid    I attest that I have reviewed the above information.  Signed: Edwin Cap, CCC-SLP  12/12/2018 11:55 AM

## 2018-12-30 NOTE — Unmapped (Signed)
Encounter addended by: Edwin Cap, CCC-SLP on: 12/30/2018 11:58 AM   Actions taken: Clinical Note Signed

## 2019-01-06 DIAGNOSIS — J3089 Other allergic rhinitis: Principal | ICD-10-CM

## 2019-01-06 DIAGNOSIS — A498 Other bacterial infections of unspecified site: Principal | ICD-10-CM

## 2019-01-06 DIAGNOSIS — J479 Bronchiectasis, uncomplicated: Principal | ICD-10-CM

## 2019-01-09 MED ORDER — MONTELUKAST 10 MG TABLET: tablet | 0 refills | 0 days | Status: AC

## 2019-01-10 DIAGNOSIS — J479 Bronchiectasis, uncomplicated: Principal | ICD-10-CM

## 2019-01-10 MED ORDER — HYDROCODONE-HOMATROPINE 5 MG-1.5 MG/5 ML ORAL SYRUP: 5 mL | mL | 0 refills | 4 days | Status: AC

## 2019-01-10 NOTE — Unmapped (Signed)
Pharmacy Refill Request  Hycodan    Last OV: 09/27/2018    Last written Rx: 08/01/2018       Upcoming appointment: None seen at this time.     Thank you

## 2019-01-16 NOTE — Unmapped (Signed)
Order faxed over to [facility] at [facility] for ONO.     Fax #: [phone]  Phone #: [phone]    Fax confirmation received.

## 2019-01-16 NOTE — Unmapped (Signed)
Placed orders for overnight oximetry testing to be done by local DME company. Also ordering 6 minute walk test to be done locally with results faxed to me.  Would need to be qualified for supplemental oxygen by one of these tests in order to qualify for portable concentrator.

## 2019-01-17 ENCOUNTER — Encounter
Admit: 2019-01-17 | Discharge: 2019-02-15 | Payer: MEDICARE | Attending: Speech-Language Pathologist | Primary: Speech-Language Pathologist

## 2019-01-17 ENCOUNTER — Encounter: Admit: 2019-01-17 | Discharge: 2019-01-18 | Payer: MEDICARE

## 2019-01-17 DIAGNOSIS — R1310 Dysphagia, unspecified: Principal | ICD-10-CM

## 2019-01-18 NOTE — Unmapped (Signed)
Addended by: Viona Gilmore on: 01/18/2019 10:32 AM     Modules accepted: Orders

## 2019-01-19 NOTE — Unmapped (Signed)
[facility] ADULT SPEECH THERAPY [place]  OUTPATIENT SPEECH PATHOLOGY  01/17/2019  Note Type: Evaluation          Patient Name: Jordan [name]  Date of Birth:July 02, 1955  Session Number: 1  Diagnosis: bronchiectasis    Date of Evaluation: 01/17/19     Referred by: Dr. Romana Juniper  Reason for Referral: MBSS    Assessment: Pt seen today for MBSS in lateral and AP views. He currently presents with an overall moderate pharyngeal dysphagia. This is further c/b moderately reduced apposition of the BOT to the posterior pharyngeal wall, moderately reduced pharyngeal constriction (L > R on AP view), moderately reduced hyolaryngeal elevation/excursion, partial epiglottic deflection, and reduced laryngeal closure. These deficits resulted in consistent trace penetration above the TVC with thin liquids and puree that remained along the laryngeal surface of the epiglottis. Material is effectively ejected with cued cough. No laryngeal penetration observed with solid consistency. No aspiration visualized on today's study. Moderate-severe pharyngeal residual within the vallecula and pyriform sinuses (L > R on AP view). L head turn + liquid wash was effective in clearing most of the residual material. Chin tuck did not prove effective in reducing vallecula residual. UES opening/relaxation appeared adequate for bolus clearance into and passage through the upper esophagus when bolus material reached this level.     Recommend continued regular diet and thin liquids following strict aspiration precautions and compensatory strategies: upright with intake, small and single bites/sips, slow rate, alternate solids and liquids, L head turn with thicker consistencies/solid foods, cough and re-swallow every 2-3 bites/sips. Pt verbalized understanding of all education provided today. Plan for SLP f/u PRN.    Dysphagia Diagnosis: Moderate pharyngeal stage dysphagia  Dysphagia Severity Index: 4 - Moderate dysphagia PO Recommendations : Regular Solids (no restrictions), Thin liquids, Medication Administration via PO    Recommended Compensatory Techniques : Slow rate, Small sips/bites, No mixed consistencies, Double swallow, Alternate liquids and solids, Head turn left  Prognosis:  Good            Positive Prognosis Rationale: Age, Behavior, Motivation, History of compliance, Pain status, Good safety awareness, Response to trial treatment      Goals:  Patient and Family Goals: Eat and drink without developing pneumonia       SUBJECTIVE:  Jordan [name] is a 63 y.o.male with history of bronchiectasis secondary to unknown cause (idiopathic) with Pseudomonas colonization s/p resection of RML and RLL, resection of right 11 and 12th ribs, and recently diagnosed prostate cancer. He was seen by SLP in ENT clinic for clinical swallowing evaluation on 12/12/18 with no overt clinical s/sx aspiration however given PMH remains at high risk for microaspiration. He had an MBSS on 03/12/2015 which indicated laryngeal penetration of thin liquids with no aspiration. Today, he reported that his most salient difficulty is with harder solids (hamburger, steak) and anything that is strawberry or chocolate flavored. He endorsed some coughing with intake. He is otherwise eating a regular consistency diet and thin liquids. No alternate means of nutrition in place.    Communication Preference: Verbal, Written, Visual    Barriers to Learning: No Barriers               Prior treatment for referral reason: No    Pain?: No   Precautions: Aspiration    Prior Function: Independent      Past Medical History:   Diagnosis Date   ??? Abscess of lung (CMS-HCC) 11/06/2010    CT Chest 10/22/10 12/05/2010 right thoracotomy  resection of right middle lobe and resection of right lower lobe abscess    ??? Arthritis     back   ??? Biceps tendon tear 2013    left side   ??? Bronchiectasis (CMS-HCC)     chronic psuedomonas infection ??? Degenerative joint disease of left acromioclavicular joint    ??? GERD (gastroesophageal reflux disease)    ??? Hypertension    ??? Obstructive sleep apnea on CPAP 03/14/13    AHI 33.5, on BiPAP 12/8 based on sleep study 09/2013   ??? Pneumonia 2012    Lung abcess    ??? PONV (postoperative nausea and vomiting)    ??? Prostate cancer (CMS-HCC) 09/09/2017   ??? Rotator cuff injury left   ??? Vertigo       Family History   Problem Relation Age of Onset   ??? Alzheimer's disease Mother    ??? Stroke Mother    ??? Bronchiectasis  Brother         presumed   ??? Bronchiectasis  Brother         presumed   ??? Liver disease Father    ??? Diabetes Father    ??? Heart failure Father    ??? Bronchiectasis  Daughter    ??? Asthma Son         childhood asthma   ??? Clotting disorder Neg Hx    ??? Anesthesia problems Neg Hx      Past Surgical History:   Procedure Laterality Date   ??? APPENDECTOMY  1969   ??? BACK SURGERY     ??? BRONCHOSCOPY  10/19/2010    [facility]   ??? CARPAL TUNNEL RELEASE  1999   ??? LUNG REMOVAL, PARTIAL Right 10/2010    RML and RLL partially resected   ??? MENISCECTOMY  2011   ??? NECK SURGERY  1993, 1999, 2002   ??? PR GERD TST W/ MUCOS IMPEDE ELECTROD,>1HR N/A 07/06/2012    Procedure: ESOPHAGEAL FUNCTION TEST, GASTROESOPHAGEAL REFLUX TEST W/ NASAL CATHETER INTRALUMINAL IMPEDANCE ELECTRODE(S) PLACEMENT, RECORDING, ANALYSIS AND INTERPRETATION; PROLONGED;  Surgeon: None None;  [facility];  Service: Gastroenterology   ??? PR OPEN TREAT RIB FRACTURE W/INT FIXATION, UNILATERAL, 1-2 RIBS Right 03/19/2015    Procedure: OPEN TREATMENT OF RIB FRACTURE REQUIRING INTERNAL FIXATION, UNILATERAL; 1-2 RIBS;  Surgeon: Evert Kohl, MD;  Location: MAIN OR [facility];  Service: Cardiothoracic   ??? PR THORACOTOMY W/THERAP WEDGE RESEXN ADDL IPSILATRL Right 08/07/2013 Procedure: THORACOTOMY; WITH THERAPEUTIC LOBECTOMY OF RIGHT MIDDLE AND RIGHT LOWER LOBE RESECTION , EACH ADDITIONAL RESECTION, IPSILATERAL;  Surgeon: Alvester Chou, MD;  Location: MAIN OR [facility];  Service: Cardiothoracic   ??? TONSILLECTOMY  1965      Allergies   Allergen Reactions   ??? Gentamicin Other (See Comments)     BALANCE ISSUES   ??? Tobramycin Other (See Comments)     ototoxicity   ??? Morphine Nausea And Vomiting     Social History     Tobacco Use   ??? Smoking status: Never Smoker   ??? Smokeless tobacco: Never Used   Substance Use Topics   ??? Alcohol use: No     Alcohol/week: 0.0 standard drinks      Current Outpatient Medications   Medication Sig Dispense Refill   ??? albuterol 2.5 mg /3 mL (0.083 %) nebulizer solution Inhale 2.5 mg by nebulization Four (4) times a day.     ??? albuterol HFA 90 mcg/actuation inhaler INHALE 2 PUFFS INTO LUNGS EVERY 4  HOURS AS NEEDED FOR WHEEZING OR SHORTNESS OF BREATH. 6.7 Inhaler 3   ??? alcohol swabs PadM USE AS DIRECTED WITH INHALED ANTIBIOTICS 100 each 5   ??? ascorbic acid (CHEWABLE VITAMIN C) 250 mg Chew Chew 1,000 mg Two (2) times a day.      ??? azithromycin (ZITHROMAX) 250 MG tablet TAKE 1 TABLET BY MOUTH EVERY DAY 30 tablet 11   ??? BENEFIBER, GUAR GUM, ORAL Take 1 Act by mouth Two (2) times a day. Take 1 tablespoon BID     ??? CIALIS 20 mg tablet      ??? ciprofloxacin HCl (CIPRO) 750 MG tablet Take 1 tablet (750 mg total) by mouth Two (2) times a day. 28 tablet 0   ??? colistimethate (COLYMYCIN) 150 mg injection Inject 2mL sterile water for injection to mix colistin vial, then draw up 2mL (150mg ) and inhale 2 times a day,28 days on and 28 days off. 60 each 5   ??? dornase alfa (PULMOZYME) 1 mg/mL nebulizer solution Inhale 2.5 mg daily. Use at least 30-60 minutes before airway clearance, or after airway clearance 225 mL 3   ??? ferrous sulfate (IRON, FERROUS SULFATE,) 325 (65 FE) MG tablet Take 325 mg by mouth Two (2) times a day. ??? HYDROcodone-acetaminophen (NORCO) 7.5-325 mg per tablet Take 1 tablet by mouth three (3) times a day (at 6am, noon and 6pm).     ??? HYDROcodone-homatropine (HYCODAN) 5-1.5 mg/5 mL syrup Take 5 mL by mouth every four (4) hours as needed for cough. 120 mL 0   ??? linaclotide (LINZESS ORAL) Take 145 mg by mouth daily.     ??? montelukast (SINGULAIR) 10 mg tablet TAKE 1 TABLET BY MOUTH EVERY DAY AT NIGHT 90 tablet 0   ??? nebulizers (LC PLUS) Misc Provide 1 device  for use with inhaled medication.(Colistin) 1 each 5   ??? omeprazole (PRILOSEC) 20 MG capsule Take 20 mg by mouth Two (2) times a day.      ??? sodium chloride 10 % Nebu Inhale 5 mL by nebulization two (2) times a day for 365 doses. 900 mL 2   ??? sterile water, PF, Soln Use 2mL to mix Colistin,then add additional 2mL to neb cup with 2mL of mixed colistin.Inhale twice daily 28 days on and 28 days off. 600 mL 5   ??? SYMBICORT 160-4.5 mcg/actuation inhaler INHALE 2 PUFFS TWO (2) TIMES A DAY. 30.6 Inhaler 1   ??? syringe with needle 3 mL 20 gauge x 1 1/2 Syrg For use with inhaled antibiotic (Colistin) 60 each 5   ??? tobramycin, PF, ([name]) 300 mg/5 mL nebulizer solution Inhale 5 mL (300 mg total) by nebulization every twelve (12) hours. 280 mL 5     No current facility-administered medications for this encounter.        Objective Swallow  Cognitive-Communication Status : WNL  Dysphagia History: Please see current functional status for full details  Diet Prior to this Study: Regular diet and thin liquids  Details: Avoids hard solids and anything that is strawberry or chocolate flavored  Respiratory Status : Room air  Positioning : Upright in chair, Lateral View, AP View, MBSS Chair  Secretions : Managed independently this study    Presentation Methods Used During Study  Bolus Presentation : Straw Sip, Spoon, Fed self    Thin Liquids   Oral Stage: WFL  Swallow Initiation : Vallecular space swallow initiation  Nasopharyngeal Reflux : None noted Pharyngeal Stage : Moderately reduced laryngeal excursion, Partial epiglottic inversion,  Laryngeal Closure Reduced, Tongue Base Retraction moderately impaired, Pharyngeal Constriction moderately impaired  Penetration Aspiration Score: 3 - Material enters the airway, remains above the vocal folds, and is not ejected from the airway.  Aspiration Volume : None  Esophageal Phase Screening : UES Function WFL    Puree  Oral Stage : WFL  Swallow Initiation : Base of tongue swallow initiation  Nasopharyngeal Reflux: None noted  Pharyngeal Stage : Moderately reduced laryngeal excursion, Partial epiglottic inversion, Laryngeal Closure Reduced, Pharyngeal Constriction moderately impaired, Tongue Base Retraction moderately impaired, Residue after swallow  Penetration Aspiration Score: 3 - Material enters the airway, remains above the vocal folds, and is not ejected from the airway.  Aspiration Volume : None  Esophageal Phase Screening : UES Function WFL    Solids   Oral Stage : WFL  Swallow Initiation : Base of tongue swallow initiation  Nasopharyngeal Reflux: None noted  Pharyngeal Stage : Moderately reduced laryngeal excursion, Laryngeal Closure Reduced, Partial epiglottic inversion, Pharyngeal Constriction moderately impaired, Tongue Base Retraction moderately impaired, Residue after swallow  Penetration Aspiration Score: 1 - Material does not enter airway  Aspiration Volume : None  Esophageal Phase Screening : UES Function WFL    Education Provided: Patient, SLP Plan of Care, Safe swallowing strategies, Oral care and aspiration precautions, Compensatory Swallowing Strategies    Response to Education: Understanding verbalized    Session Duration : 20    Today's Charges (noted here with $$):     SLP Evaluations  $$ Motion Fluoro Swallow Eval- Modified [mins]: 20     I attest that I have reviewed the above information.  SignedEliezer Mccoy, SLP  01/17/2019 11:57 AM

## 2019-01-26 ENCOUNTER — Encounter: Admit: 2019-01-26 | Discharge: 2019-01-27 | Payer: MEDICARE

## 2019-01-26 DIAGNOSIS — J479 Bronchiectasis, uncomplicated: Principal | ICD-10-CM

## 2019-01-26 NOTE — Unmapped (Signed)
Sputum specimen collected, delivered to lab.

## 2019-02-02 NOTE — Unmapped (Signed)
[facility] Refill Coordination Note    Specialty Medication(s) to be Shipped:   CF/Pulmonary: -Nebulized colistin (150mg  vials) and supply kit  Other medication(s) to be shipped: LC Plus neb cups, Sodium Chloride 10%, Alcohol Swabs, BD Luer Lok Syringes & Sterile Water     Jordan [name], DOB: 1956-01-14  Phone: [phone] (home)     All above HIPAA information was verified with patient.     Completed refill call assessment today to schedule patient's medication shipment from the [facility] [phone]).       Specialty medication(s) and dose(s) confirmed: Regimen is correct and unchanged.   Changes to medications: Jordan [name] reports no changes at this time.  Changes to insurance: No  Questions for the pharmacist: No    Confirmed patient received Welcome Packet with first shipment. The patient will receive a drug information handout for each medication shipped and additional FDA Medication Guides as required.       DISEASE/MEDICATION-SPECIFIC INFORMATION        For CF patients: CF [facility] Active? No-not enrolled    SPECIALTY MEDICATION ADHERENCE     Medication Adherence    Patient reported X missed doses in the last month: 0  Specialty Medication: Colistimethate 150mg   Patient is on additional specialty medications: No  Informant: patient  Reliability of informant: reliable  Support network for adherence: family member        Colistimethate 150 mg: 0 days of medicine on hand ( To start 02/21/2019)    SHIPPING     Shipping address confirmed in Epic.     Delivery Scheduled: Yes, Expected medication delivery date: 02/13/2019.     Medication will be delivered via UPS to the home address in [employer] [place].    Jordan [name]   [facility] Specialty Technician

## 2019-02-07 ENCOUNTER — Encounter: Admit: 2019-02-07 | Discharge: 2019-02-08 | Payer: MEDICARE | Attending: Internal Medicine | Primary: Internal Medicine

## 2019-02-07 DIAGNOSIS — J479 Bronchiectasis, uncomplicated: Principal | ICD-10-CM

## 2019-02-07 NOTE — Unmapped (Signed)
[facility] Pulmonary Diseases and Critical Care Medicine  Video Telehealth Visit Note    02/07/19    10:01 AM  -  Assessment:      Patient:Jordan [name] (Jun 11, 1955)    Mr. Pickup is a 63 y.o. male who is seen in a video telehealth visit for bronchiectasis follow up.  His spirometry has continued to slowly decline after his lung resection despite intensification of his airway clearance regimen (increased hypertonic saline concentration to 10%, Pulmozyme, mechanical vest).  He feels that the 6-minute walk test does not reflect his experience in the afternoons.     Plan:      Problem List Items Addressed This Visit        Respiratory    Bronchiectasis (CMS-HCC) - Primary    Relevant Orders    CT Chest Wo Contrast (Completed)    6 - Minute walk (Completed)        No changes were made to his airway clearance regimen or inhaled therapies.    Will repeat 6 minute walk test at [facility] in the afternoon to see if we can reproduce what he reports happening at home.  At this time, [name] does not qualify for supplemental oxygen.    Repeating non-contrast chest CT to further evaluate the cause of his continued decline in lung function.  This imaging may demonstrate findings consistent with an infection that hasn't been cultured from expectorated sputum.    He will provide a another sputum sample for bacterial and AFB culture when he comes for the chest CT and 6-minute walk test.  If he has worsening clinical symptoms, it may be reasonable to have a PICC line placed and treat with IV antibiotics.  He last received IV antibiotics in January 2019 but has been treated with oral antibiotics on 04/19/18 and 08/17/18.     We discussed whether lung transplantation was an option for Jordan [name].  While he is not currently on oxygen, I am deeply concerned about his continued decline in lung function in the absence of repeated exacerbations.  Due to his prior lung resections, lung transplantation surgery will be more complicated.  Additionally, Jordan [name] has a history of prostate cancer (diagnosed in June 2019 and currently without evidence of disease).  I will need to speak with my lung transplant colleagues about how far out Jordan [name] needs to be from his prostate cancer diagnosis in order to be considered a transplantation candidate.    Return in about 3 months (around 05/10/2019) for Recheck.    The above plan was discussed with the patient and he is in agreement.       Subjective:      Patient's primary [place] pulmonologist: Garner Nash  Patient's physical location during visit [facility], State): [place], [place]  Patient's call back number: [phone]    Name/Relationship of other individuals with the patient in the room during video telehealth visit: None  I have confirmed that the patient requests and consents to having the above listed persons stay in the patient's room during the video telehealth visit.    The patient was advised that the provider must close the video visit first when visit is completed.    HPI: [name] is a 63 y.o. male who is seen in a video telehealth visit for follow up of bronchiectasis.    09/27/18:  I last saw him on 05/17/18. He reached out at the end of May reporting symptoms c/w an exacerbation. I treated him with a two week course of Ciprofloxacin, which  seemed to help.    Reports that he feels winded too much - if walks up incline or up a couple hundred feet. This has worsened over the past year. Has to stop and let lungs catch up.  Of note, he has been off testosterone supplementation for the past year after he was diagnosed with prostate cancer.    He received the [facility], which he is using with nebulized HTS 7% and Pulmozyme. After using the Vest for 6-8 weeks, he noticed improvement with more phlegm mobilization. Sometimes has to pause it to cough to get stuff up.  Plugging feeling happening a lot more, especially with the vest.  Most of the time, sputum is thinned out with Pulmozyme but still gets those plugs.    Had to stop eating ice cream or drinking milkshake - will cough will eating and that makes him vomit on occasion. Past few weeks, when eating will cough, no matter what he eats.  Happens almost once a day. Doesn't feel like things get stuck typically.      02/07/19:  Following his last visit, [name] reached out regarding portable oxygen.  We had considered the life 2000 but he does not qualify because he does not use supplemental oxygen.  He performed the 6-minute walk test prior to today's visit but did not desaturate.  He wonders if this is because the test was done in the morning and he doesn't have breathing issues in the morning.  Typically, breathing issues occur in afternoon.  During these episodes, he will have to use his inhaler and feels like he is suffocating.  He describes it as an uncomfortable feeling that limits his ability to do things with family.  If he is having trouble catching breath, oximetry is 92-93%. Has seen it in the 80s if comes in from outside and is short of breath.    For airway clearance, he is nebulizing 10% hypertonic saline and using his percussive vest for 20 minute sessions in AM and PM. Has to pause it to cough and get stuff up.  Coughing up more stuff since last visit and is waking up 1-2 times a night with cough.having coughing fits between clearance session.  What he expectorates is less of a mucous plug and mostly stringy sputum that is yellowish to light brown in color.  No hemoptysis.  On Saturday, he had a coughing fit that nearly triggered posttussive emesis.  He only expectorated about 3-4 globs of phlegm.  Nebulizing Pulmozyme daily.  Alternates [name] and colistin every other month.  On [name] this month and will start Colistin on Dec 1.    Past Medical History:   Diagnosis Date   ??? Abscess of lung (CMS-HCC) 11/06/2010    CT Chest 10/22/10 12/05/2010 right thoracotomy resection of right middle lobe and resection of right lower lobe abscess    ??? Arthritis     back   ??? Biceps tendon tear 2013    left side   ??? Bronchiectasis (CMS-HCC)     chronic psuedomonas infection   ??? Degenerative joint disease of left acromioclavicular joint    ??? GERD (gastroesophageal reflux disease)    ??? Hypertension    ??? Obstructive sleep apnea on CPAP 03/14/13    AHI 33.5, on BiPAP 12/8 based on sleep study 09/2013   ??? Pneumonia 2012    Lung abcess    ??? PONV (postoperative nausea and vomiting)    ??? Prostate cancer (CMS-HCC) 09/09/2017   ??? Rotator cuff  injury left   ??? Vertigo        Past Surgical History:   Procedure Laterality Date   ??? APPENDECTOMY  1969   ??? BACK SURGERY     ??? BRONCHOSCOPY  10/19/2010    [facility]   ??? CARPAL TUNNEL RELEASE  1999   ??? LUNG REMOVAL, PARTIAL Right 10/2010    RML and RLL partially resected   ??? MENISCECTOMY  2011   ??? NECK SURGERY  1993, 1999, 2002   ??? PR GERD TST W/ MUCOS IMPEDE ELECTROD,>1HR N/A 07/06/2012    Procedure: ESOPHAGEAL FUNCTION TEST, GASTROESOPHAGEAL REFLUX TEST W/ NASAL CATHETER INTRALUMINAL IMPEDANCE ELECTRODE(S) PLACEMENT, RECORDING, ANALYSIS AND INTERPRETATION; PROLONGED;  Surgeon: None None;  [facility]: [facility];  Service: Gastroenterology   ??? PR OPEN TREAT RIB FRACTURE W/INT FIXATION, UNILATERAL, 1-2 RIBS Right 03/19/2015    Procedure: OPEN TREATMENT OF RIB FRACTURE REQUIRING INTERNAL FIXATION, UNILATERAL; 1-2 RIBS;  Surgeon: Evert Kohl, MD;  Location: MAIN OR [facility];  Service: Cardiothoracic   ??? PR THORACOTOMY W/THERAP WEDGE RESEXN ADDL IPSILATRL Right 08/07/2013    Procedure: THORACOTOMY; WITH THERAPEUTIC LOBECTOMY OF RIGHT MIDDLE AND RIGHT LOWER LOBE RESECTION , EACH ADDITIONAL RESECTION, IPSILATERAL;  Surgeon: Alvester Chou, MD;  Location: MAIN OR [facility];  Service: Cardiothoracic   ??? TONSILLECTOMY  1965       Family History   Problem Relation Age of Onset   ??? Alzheimer's disease Mother    ??? Stroke Mother    ??? Bronchiectasis  Brother         presumed   ??? Bronchiectasis  Brother         presumed   ??? Liver disease Father    ??? Diabetes Father    ??? Heart failure Father    ??? Bronchiectasis  Daughter    ??? Asthma Son         childhood asthma   ??? Clotting disorder Neg Hx    ??? Anesthesia problems Neg Hx        Social History     Tobacco Use   ??? Smoking status: Never Smoker   ??? Smokeless tobacco: Never Used   Substance Use Topics   ??? Alcohol use: No     Alcohol/week: 0.0 standard drinks   ??? Drug use: No       Allergies  Reviewed on 01/17/2019      Reactions Comment    Gentamicin Other (See Comments) BALANCE ISSUES    Tobramycin Other (See Comments) ototoxicity    Morphine Nausea And Vomiting         Current Outpatient Medications   Medication Sig Dispense Refill   ??? albuterol 2.5 mg /3 mL (0.083 %) nebulizer solution Inhale 2.5 mg by nebulization Four (4) times a day.     ??? albuterol HFA 90 mcg/actuation inhaler INHALE 2 PUFFS INTO LUNGS EVERY 4 HOURS AS NEEDED FOR WHEEZING OR SHORTNESS OF BREATH. 6.7 Inhaler 3   ??? alcohol swabs PadM USE AS DIRECTED WITH INHALED ANTIBIOTICS 100 each 5   ??? ascorbic acid (CHEWABLE VITAMIN C) 250 mg Chew Chew 1,000 mg Two (2) times a day.      ??? azithromycin (ZITHROMAX) 250 MG tablet TAKE 1 TABLET BY MOUTH EVERY DAY 30 tablet 11   ??? BENEFIBER, GUAR GUM, ORAL Take 1 Act by mouth Two (2) times a day. Take 1 tablespoon BID     ??? CIALIS 20 mg tablet      ??? ciprofloxacin HCl (  CIPRO) 750 MG tablet Take 1 tablet (750 mg total) by mouth Two (2) times a day. 28 tablet 0   ??? colistimethate (COLYMYCIN) 150 mg injection Inject 2mL sterile water for injection to mix colistin vial, then draw up 2mL (150mg ) and inhale 2 times a day,28 days on and 28 days off. 60 each 5   ??? dornase alfa (PULMOZYME) 1 mg/mL nebulizer solution Inhale 2.5 mg daily. Use at least 30-60 minutes before airway clearance, or after airway clearance 225 mL 3   ??? ferrous sulfate (IRON, FERROUS SULFATE,) 325 (65 FE) MG tablet Take 325 mg by mouth Two (2) times a day.     ??? HYDROcodone-acetaminophen (NORCO) 7.5-325 mg per tablet Take 1 tablet by mouth three (3) times a day (at 6am, noon and 6pm).     ??? HYDROcodone-homatropine (HYCODAN) 5-1.5 mg/5 mL syrup Take 5 mL by mouth every four (4) hours as needed for cough. 120 mL 0   ??? linaclotide (LINZESS ORAL) Take 145 mg by mouth daily.     ??? montelukast (SINGULAIR) 10 mg tablet TAKE 1 TABLET BY MOUTH EVERY DAY AT NIGHT 90 tablet 0   ??? nebulizers (LC PLUS) Misc Provide 1 device  for use with inhaled medication.(Colistin) 1 each 5   ??? omeprazole (PRILOSEC) 20 MG capsule Take 20 mg by mouth Two (2) times a day.      ??? sodium chloride 10 % Nebu Inhale 5 mL by nebulization two (2) times a day for 365 doses. 900 mL 2   ??? sterile water, PF, Soln Use 2mL to mix Colistin,then add additional 2mL to neb cup with 2mL of mixed colistin.Inhale twice daily 28 days on and 28 days off. 600 mL 5   ??? SYMBICORT 160-4.5 mcg/actuation inhaler INHALE 2 PUFFS TWO (2) TIMES A DAY. 30.6 Inhaler 1   ??? syringe with needle 3 mL 20 gauge x 1 1/2 Syrg For use with inhaled antibiotic (Colistin) 60 each 5   ??? tobramycin, PF, ([name]) 300 mg/5 mL nebulizer solution Inhale 5 mL (300 mg total) by nebulization every twelve (12) hours. 280 mL 5     No current facility-administered medications for this visit.      Physical Exam (as applicable to extent possible): Well appearing white male in no acute distress, in good spirits. Easy work of breathing without accessory muscle use. Speaking in full sentences. No audible wheezing or coughing.    Diagnostic Review:   The following data were reviewed during this video telehealth visit with key findings summarized below:    Pulmonary Function Testing:     FVC (% predicted) FEV1 (% predicted) FEV1/FVC   11/10/2013 3.19 L (68%) 2.16 L (61%)  68%   02/23/2014 3.50 L (76%)  2.23 L (64%)  64%   07/12/2014  2.94 L (66%)  2.02 L (58%)  69%   01/15/2015  2.82 L (62%)  1.79 L (52%)  63%   02/08/2015  3.04 L (66%)  2.06 L (59%)  68%   04/02/2015  2.86 L (63%)  1.83 L (53%)  64%   12/10/2015  2.77 L (62%)  1.62 L (48%)  58%   01/27/2016  2.42 L (54%)  1.55 L (46%) 64%   03/24/2016  3.36 L (75%)  1.99 L (59%)  59%   07/21/2016  2.56 L (57%)  1.54 L (46%)  60%   12/29/2016  2.80 L (63%)  1.57 L (47%)  56%   04/06/2017  2.61 L (59%)  1.46 L (44%)  56%   07/06/2017  2.56 L (58%)  1.49 L (45%)  58%   10/12/2017  2.63 L (59%)  1.47 L (44%)  56%   01/18/2018  2.68 L (63%)  1.36 L (42%)  51%   05/17/2018  2.49 L (57%)  1.25 L (38%)  50%   09/26/2018  2.43 L (57%)  1.26 L (39%)  52%   01/26/2019  2.16 L (51%)  1.16 L (36%)  54%     6-Minute Walk (01/26/19): Resting Heart Rate was 86. Maximum Heart Rate achieved 97. Patient did reach target heart rate (86-133). Resting O2 Saturation was 99% on RA. Resting BORG score was 1. O2 saturation nadir was 95% at minutes 3 and 6. Total 6 minute walk distance was 350 meters. Predicted 6 minute walk distance of 402.14m (LLN). Percent of predicted 6 minute walk distance of 87%. Heart rate returned to 86 bpm after 2 minutes of recovery. O2 saturation returned to 96% on RA after 2 minutes of recovery. Maximum BORG score was 6 at minutes 5 and 6. Concluding BORG score was 1 after 2 minutes of recovery. No evidence of resting or exercise induced hypoxemia. No supplemental oxygen is required at this time.    Cultures:  ?? Source Bacterial Culture AFB Smear AFB Culture   04/02/15 Sputum 2+ OPF; 1+ Probably Mucoid PsA Negative Negative   07/02/15 Sputum 4+ OPF; 3+ Mucoid PsA; 3+ Smooth PsA; 1+ Stenotrophomonas Negative Negative   01/23/16 Sputum 2+ OPF; 1+ Mucoid PsA; 1+ Smooth PsA - -   03/24/16 Sputum 3+ OPF; 3+ Mucoid PsA; 3+ Smooth PsA Negative Overgrown   07/21/16 Sputum 4+ OPF; 4+ Mucoid PsA; 2+ Smooth PsA Negative Negative   12/29/16 Sputum 4+ OPF; 3+ Mucoid PsA; 3+ Smooth PsA Negative Actinomadura sp   04/06/17 Sputum 3+ OPF; 4+ Mucoid PsA Negative Negative   07/06/17 Sputum 4+ OPF; 3+ Mucoid PsA Negative Negative   10/12/17 Sputum 4+ OPF; 3+ Mucoid PsA Negative Negative   01/18/18 Sputum OPF Negative Negative   05/17/18 Sputum 4+ OPF; 3+ Mucoid PsA - - 09/26/18 Sputum 4+ OPF; 3+ Mucoid PsA Negative Negative   01/26/19 Sputum 4+ OPF; 2+ Mucoid PsA Negative NGTD   ??  Laboratory Data:  05/23/18:           Lab Results   Component Value Date    WBC 5.4 04/19/2017    HGB 14.1 04/19/2017    HCT 43.9 04/19/2017    PLT 140 (L) 04/19/2017       Chemistry        Component Value Date/Time    NA 140 04/19/2017 0545    NA 137 03/24/2015 1731    K 4.3 04/19/2017 0545    K 3.9 03/24/2015 1731    CL 106 04/19/2017 0545    CL 93 (L) 08/13/2013 0357    CO2 26.0 04/19/2017 0545    CO2 32 (H) 08/13/2013 0357    BUN 22 (H) 04/19/2017 0545    BUN 24 (H) 08/13/2013 0357    CREATININE 1.21 04/19/2017 0545    CREATININE 0.4 (L) 06/12/2014 0930    GLU 92 04/19/2017 0545        Component Value Date/Time    CALCIUM 8.9 04/19/2017 0545    CALCIUM 8.9 08/13/2013 0357    ALKPHOS 73 04/14/2017 2122    ALKPHOS 75 06/21/2013 2131    AST 19 04/14/2017 2122    AST 16 (L) 06/21/2013 2131  ALT 19 04/14/2017 2122    ALT 36 06/21/2013 2131    BILITOT 0.6 04/14/2017 2122    BILITOT 0.4 06/21/2013 2131        Imaging  Chest CT (03/31/16): Images personally reviewed. Status post right middle and right lower lobectomies. Tiny loculated basilar right hydropneumothorax, probably chronic. Moderate cylindrical and varicoid bronchiectasis throughout both lungs, with associated scattered mucoid impaction, tree-in-bud opacities and scattered peribronchovascular and subpleural consolidation in the right upper lobe. 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 Two-vessel coronary atherosclerosis. Nonobstructing right nephrolithiasis.    Modified Barium Swallow (01/17/19): Shallow laryngeal penetration observed with thin liquids and puree.  No aspiration identified with any of the tested consistencies.    I spent 38 minutes on the real time audio and video visit with the patient. I spent an additional 10 minutes on pre- and post-visit activities.     The patient was not located and I was not located within 250 yards of a hospital based location during the audio and video visit. The patient was physically located in [place] or a state in which I am permitted to provide care. The patient and/or parent/guardian understood that s/he may incur co-pays and cost sharing, and agreed to the telemedicine visit. The visit was reasonable and appropriate under the circumstances given the patient's presentation at the time.    The patient and/or parent/guardian has been advised of the potential risks and limitations of this mode of treatment (including, but not limited to, the absence of in-person examination) and has agreed to be treated using telemedicine. The patient's/patient's family's questions regarding telemedicine have been answered.    If the visit was completed in an ambulatory setting, the patient and/or parent/guardian has also been advised to contact their provider???s office for worsening conditions, and seek emergency medical treatment and/or call 911 if the patient deems either necessary.      Portions of this record have been created using Scientist, clinical (histocompatibility and immunogenetics). Dictation errors have been sought, but may not have been identified and corrected.    Viona Gilmore, MD  02/07/19  10:01 AM    Billing Guidance: [phone] (New Patients), [phone] (Consults), [phone] (Established Patients)

## 2019-02-10 NOTE — Unmapped (Signed)
Phoned patient to let him know that we had scheduled his at [facility] on same day as his CT scan on 11/25.    He has no additional questions at this time.

## 2019-02-13 DIAGNOSIS — Z2239 Carrier of other specified bacterial diseases: Principal | ICD-10-CM

## 2019-02-13 DIAGNOSIS — J479 Bronchiectasis, uncomplicated: Principal | ICD-10-CM

## 2019-02-13 MED ORDER — SYRINGE WITH NEEDLE 3 ML 20 GAUGE X 1 1/2"
30 refills | 0 days
Start: 2019-02-13 — End: ?

## 2019-02-13 MED ORDER — BD LUER-LOK SYRINGE 3 ML 20 GAUGE X 1"
3 refills | 0 days
Start: 2019-02-13 — End: ?

## 2019-02-13 MED FILL — SODIUM CHLORIDE 10 % FOR NEBULIZATION: RESPIRATORY_TRACT | 30 days supply | Qty: 900 | Fill #2

## 2019-02-13 MED FILL — BD LUER-LOK SYRINGE 3 ML 20 GAUGE X 1": 30 days supply | Qty: 60 | Fill #0 | Status: AC

## 2019-02-13 MED FILL — WATER FOR INJECTION, STERILE INJECTION SOLUTION: 30 days supply | Qty: 600 | Fill #3

## 2019-02-13 MED FILL — ALCOHOL SWABS: 50 days supply | Qty: 100 | Fill #3

## 2019-02-13 MED FILL — WATER FOR INJECTION, STERILE INJECTION SOLUTION: 30 days supply | Qty: 600 | Fill #3 | Status: AC

## 2019-02-13 MED FILL — COLISTIN (COLISTIMETHATE SODIUM) 150 MG SOLUTION FOR INJECTION: 30 days supply | Qty: 60 | Fill #3 | Status: AC

## 2019-02-13 MED FILL — LC PLUS MISC: 30 days supply | Qty: 1 | Fill #3 | Status: AC

## 2019-02-13 MED FILL — SODIUM CHLORIDE 10 % FOR NEBULIZATION: 30 days supply | Qty: 900 | Fill #2 | Status: AC

## 2019-02-13 MED FILL — BD LUER-LOK SYRINGE 3 ML 20 GAUGE X 1": 30 days supply | Qty: 60 | Fill #0

## 2019-02-13 MED FILL — LC PLUS MISC: 30 days supply | Qty: 1 | Fill #3

## 2019-02-13 MED FILL — ALCOHOL SWABS: 50 days supply | Qty: 100 | Fill #3 | Status: AC

## 2019-02-13 MED FILL — COLISTIN (COLISTIMETHATE SODIUM) 150 MG SOLUTION FOR INJECTION: 30 days supply | Qty: 60 | Fill #3

## 2019-02-14 MED ORDER — TOBRAMYCIN 300 MG/5 ML IN 0.225 % SODIUM CHLORIDE FOR NEBULIZATION
4 refills | 0 days | Status: CP
Start: 2019-02-14 — End: ?
  Filled 2019-05-15: qty 600, 30d supply, fill #4

## 2019-02-14 NOTE — Unmapped (Signed)
Hello,  Received refill request for this medication:       Pharmacy Refill Request    tobramycin, PF, ([name]) 300 mg/5 mL nebulizer solution [Pharmacy Med Name: TOBRAMYCIN (56AMPS/28DAY) 300MG /5ML]   INHALE THE CONTENTS OF 1 AMPULE VIA NEBULIZER TWICE DAILY FOR 28 DAYS ON, THEN 28 DAYS OFF         Last OV: 02/07/2019    Last written Rx: 03/11/2019    Last dispensed: 01/06/2019      Upcoming appointment: None seen at this time    Thank you

## 2019-02-15 ENCOUNTER — Ambulatory Visit: Admit: 2019-02-15 | Discharge: 2019-02-16 | Payer: MEDICARE

## 2019-02-15 ENCOUNTER — Encounter: Admit: 2019-02-15 | Discharge: 2019-02-16 | Payer: MEDICARE

## 2019-02-15 ENCOUNTER — Non-Acute Institutional Stay: Admit: 2019-02-15 | Discharge: 2019-02-16 | Payer: MEDICARE

## 2019-03-10 DIAGNOSIS — J470 Bronchiectasis with acute lower respiratory infection: Principal | ICD-10-CM

## 2019-03-10 MED ORDER — CIPROFLOXACIN 750 MG TABLET
ORAL_TABLET | Freq: Two times a day (BID) | ORAL | 0 refills | 14 days | Status: CP
Start: 2019-03-10 — End: 2019-03-24

## 2019-03-20 NOTE — Unmapped (Signed)
Interested in participating in COVID-19 trials? https://www.coronaviruspreventionnetwork.org    Symptoms of a bronchiectasis exacerbation: 48 hours of at least three of the following:  ?? Increased cough  ?? Change in volume or appearance of sputum  ?? Increased sputum purulence  ?? Worsening shortness of breath and/or exercise tolerance  ?? Fatigue and/or malaise  ?? Coughing up blood (hemoptysis)    If you are experiencing some of these symptoms:  ?? Increase the frequency and/or intensity of your airway clearance  ?? Try to submit a sputum sample for bacterial and AFB cultures (at [facility] or locally)  ?? Reach out to me or your local physician.  ?? If you need antibiotics, recommend treating for 14 days.      Please bring any new airway clearance equipment to your next visit to ensure that you are using and caring for it properly.    I recommend receiving the standard dose quadrivalent influenza vaccine as soon as possible.     Thank you for allowing me to be a part of your care. Please call the clinic with any questions.    Viona Gilmore, MD, MPH  Pulmonary and Critical Care Medicine  [address]  [id number]  [place], [place] [address]    Thank you for your visit to the [facility]. You may receive a survey from [facility] regarding your visit today, and we are eager to use this feedback to improve your experience. Thank you for taking the time to fill it out.    Between appointments, you can reach [place] at these numbers:    For appointments or the Pulmonary Nurse: [phone]  Fax: [phone]    For urgent issues after hours:  Hospital Operator: [phone], ask for Pulmonary Fellow on call    For further information, check out the websites below:    [facility] for Bronchiectasis: [email or web address]    Impact Airway Clearance Education: http://www.impact-be.com    World Bronchiectasis Conference in [place] Patient session October 03, 2016: Videos can be watched here. Bronchiectasis Toolbox (information about airway clearance): DiningCalendar.de    Copy for Individuals with Bronchiectasis and/or NTM through the Bronchiectasis Registry and the COPD Foundation: https://www.mckee-young.org/    Information about Non-tuberculous Mycobacterial Infections:  https://www.ntminfo.org     Interested in clinical trials and other research opportunities?  www.clinicaltrials.gov  The Rare Diseases Clinical Research Network [facility]) Genetic Disorders of  Mucociliary Clearance Consortium [facility]) Contact Registry is a way for patients with disorders of mucociliary clearance (such as bronchiectasis) and their family members to learn about research studies they may be able to join. Participation is completely voluntary and you may choose to withdraw at any time. There is no cost to join the [employer]. For more information or to join the registry please go to the following website:  [email or web address]

## 2019-03-30 DIAGNOSIS — J479 Bronchiectasis, uncomplicated: Principal | ICD-10-CM

## 2019-03-30 MED ORDER — SYMBICORT 160 MCG-4.5 MCG/ACTUATION HFA AEROSOL INHALER
1 refills | 0 days | Status: CP
Start: 2019-03-30 — End: ?

## 2019-03-31 NOTE — Unmapped (Signed)
Patient does not need a refill of specialty medication at this time. Moving specialty refill reminder call to appropriate date and removed call attempt data.  Spoke with patient and he states he does not need the Colistimethate 150mg  at this time due to month of January will be off month and he will restart back again in February.  [facility] will reach out to patient in couple of week to schedule out the Colistimethate.

## 2019-04-01 DIAGNOSIS — Z2239 Carrier of other specified bacterial diseases: Principal | ICD-10-CM

## 2019-04-01 DIAGNOSIS — J479 Bronchiectasis, uncomplicated: Principal | ICD-10-CM

## 2019-04-01 MED ORDER — TOBRAMYCIN 300 MG/5 ML IN 0.225 % SODIUM CHLORIDE FOR NEBULIZATION
Freq: Two times a day (BID) | RESPIRATORY_TRACT | 11 refills | 30 days | Status: CP
Start: 2019-04-01 — End: ?

## 2019-04-07 DIAGNOSIS — Z2239 Carrier of other specified bacterial diseases: Principal | ICD-10-CM

## 2019-04-07 DIAGNOSIS — J479 Bronchiectasis, uncomplicated: Principal | ICD-10-CM

## 2019-04-07 MED ORDER — TOBRAMYCIN 300 MG/5 ML IN 0.225 % SODIUM CHLORIDE FOR NEBULIZATION
Freq: Two times a day (BID) | RESPIRATORY_TRACT | 5 refills | 28.00000 days | Status: CP
Start: 2019-04-07 — End: ?

## 2019-04-07 NOTE — Unmapped (Signed)
Bronchiectasis Clinic Pharmacist Note     Medication orders were sent for: Jordan [name].  1. Bronchiectasis without complication (CMS-HCC)  2. Pseudomonas aeruginosa colonization  - tobramycin, PF, ([name]) 300 mg/5 mL nebulizer solution; Inhale 5 mL (300 mg total) by nebulization every twelve (12) hours. 28 days on and 28 days off.  Dispense: 280 mL; Refill: 5    Pharmacy sent to: [facility]      Anell Barr, PharmD, BCPS, CPP  Clinical Pharmacist Practitioner  [facility]

## 2019-04-11 DIAGNOSIS — J479 Bronchiectasis, uncomplicated: Principal | ICD-10-CM

## 2019-04-11 DIAGNOSIS — Z2239 Carrier of other specified bacterial diseases: Principal | ICD-10-CM

## 2019-04-12 DIAGNOSIS — J479 Bronchiectasis, uncomplicated: Principal | ICD-10-CM

## 2019-04-12 NOTE — Unmapped (Signed)
Hello,  I know we have been working on the PA for this. Do we proceed with the refill?    Thank you,  Gunnar Fusi

## 2019-04-14 NOTE — Unmapped (Signed)
[facility] Shared [facility] Clinical Assessment & Refill Coordination Note    [name] was delayed due to [facility].  Patient will start [name] 2/1 and then Colistin 3/1.  He did not take any antibiotic in January due to this delay.      Jordan [name], DOB: [birth year]  Phone: [phone] (home)     All above HIPAA information was verified with patient.     Was a Nurse, learning disability used for this call? No    Specialty Medication(s):   CF/Pulmonary: -Nebulized colistin (150mg  vials) and supply kit     Current Outpatient Medications   Medication Sig Dispense Refill   ??? albuterol 2.5 mg /3 mL (0.083 %) nebulizer solution Inhale 2.5 mg by nebulization Four (4) times a day.     ??? albuterol HFA 90 mcg/actuation inhaler INHALE 2 PUFFS INTO LUNGS EVERY 4 HOURS AS NEEDED FOR WHEEZING OR SHORTNESS OF BREATH. 6.7 Inhaler 3   ??? alcohol swabs PadM USE AS DIRECTED WITH INHALED ANTIBIOTICS 100 each 5   ??? ascorbic acid (CHEWABLE VITAMIN C) 250 mg Chew Chew 1,000 mg Two (2) times a day.      ??? azithromycin (ZITHROMAX) 250 MG tablet TAKE 1 TABLET BY MOUTH EVERY DAY 30 tablet 11   ??? BENEFIBER, GUAR GUM, ORAL Take 1 Act by mouth Two (2) times a day. Take 1 tablespoon BID     ??? colistimethate (COLYMYCIN) 150 mg injection Inject 2mL sterile water for injection to mix colistin vial, then draw up 2mL (150mg ) and inhale 2 times a day,28 days on and 28 days off. 60 each 5   ??? dornase alfa (PULMOZYME) 1 mg/mL nebulizer solution Inhale 2.5 mg daily. Use at least 30-60 minutes before airway clearance, or after airway clearance 225 mL 3   ??? ferrous sulfate (IRON, FERROUS SULFATE,) 325 (65 FE) MG tablet Take 325 mg by mouth Two (2) times a day.     ??? HYDROcodone-acetaminophen (NORCO) 7.5-325 mg per tablet Take 1 tablet by mouth three (3) times a day (at 6am, noon and 6pm).     ??? HYDROcodone-homatropine (HYCODAN) 5-1.5 mg/5 mL syrup Take 5 mL by mouth every four (4) hours as needed for cough. 120 mL 0 ??? montelukast (SINGULAIR) 10 mg tablet TAKE 1 TABLET BY MOUTH EVERY DAY AT NIGHT 90 tablet 0   ??? nebulizers (LC PLUS) Misc Provide 1 device  for use with inhaled medication.(Colistin) 1 each 5   ??? omeprazole (PRILOSEC) 20 MG capsule Take 20 mg by mouth Two (2) times a day.      ??? sterile water, PF, Soln Use 2mL to mix Colistin,then add additional 2mL to neb cup with 2mL of mixed colistin.Inhale twice daily 28 days on and 28 days off. 600 mL 5   ??? SYMBICORT 160-4.5 mcg/actuation inhaler INHALE 2 PUFFS INTO THE LUNGS 2 TIMES A DAY. 30.6 Inhaler 1   ??? syringe with needle (BD LUER-LOK SYRINGE) 3 mL 20 gauge x 1 Syrg For use with inhaled antibiotic (Colistin) 60 each 3   ??? syringe with needle 3 mL 20 gauge x 1 1/2 Syrg For use with inhaled antibiotic (Colistin) 60 each 5   ??? tobramycin, PF, ([name]) 300 mg/5 mL nebulizer solution Inhale 5 mL (300 mg total) by nebulization every twelve (12) hours. 28 days on and 28 days off. 280 mL 5     No current facility-administered medications for this visit.         Changes  to medications: Ajani reports no changes at this time.    Allergies   Allergen Reactions   ??? Gentamicin Other (See Comments)     BALANCE ISSUES   ??? Tobramycin Other (See Comments)     ototoxicity   ??? Morphine Nausea And Vomiting       Changes to allergies: No    SPECIALTY MEDICATION ADHERENCE     colistin 150 mg: 0 days of medicine on hand       Medication Adherence    Patient reported X missed doses in the last month: 0  Specialty Medication: colistin 150mg    Patient is on additional specialty medications: No  Informant: patient  Support network for adherence: family member          Specialty medication(s) dose(s) confirmed: Regimen is correct and unchanged.     Are there any concerns with adherence? No    Adherence counseling provided? Not needed    CLINICAL MANAGEMENT AND INTERVENTION      Clinical Benefit Assessment:    Do you feel the medicine is effective or helping your condition? Yes Clinical Benefit counseling provided? Not needed    Adverse Effects Assessment:    Are you experiencing any side effects? No    Are you experiencing difficulty administering your medicine? No    Quality of Life Assessment:    How many days over the past month did your bronchiectasis  keep you from your normal activities? For example, brushing your teeth or getting up in the morning. 2 weeks in December he was struggling with his breathing. Just a few days in January but typical for his wintertime    Have you discussed this with your provider? Not needed    Therapy Appropriateness:    Is therapy appropriate? Yes, therapy is appropriate and should be continued    DISEASE/MEDICATION-SPECIFIC INFORMATION      N/A    PATIENT SPECIFIC NEEDS     ? Does the patient have any physical, cognitive, or cultural barriers? No    ? Is the patient high risk? No     ? Does the patient require a Care Management Plan? No     ? Does the patient require physician intervention or other additional services (i.e. nutrition, smoking cessation, social work)? No      SHIPPING     Specialty Medication(s) to be Shipped:   N/a.  Denied colistin and will need again for 3/1      Changes to insurance: No    Delivery Scheduled: Yes, Expected medication delivery date: n/a.     Medication will be delivered via n/a to the confirmed n/a address in [facility].    The patient will receive a drug information handout for each medication shipped and additional FDA Medication Guides as required.  Verified that patient has previously received a Conservation officer, historic buildings.    All of the patient's questions and concerns have been addressed.    Julianne Rice   [facility] Shared [facility] Specialty Pharmacist

## 2019-04-18 MED ORDER — DORNASE ALFA 1 MG/ML SOLUTION FOR INHALATION
Freq: Every day | RESPIRATORY_TRACT | 3 refills | 90 days | Status: CP
Start: 2019-04-18 — End: 2020-04-17

## 2019-04-19 NOTE — Unmapped (Signed)
Bronchiectasis Clinic Pharmacist Note     Medication orders were sent for: Jordan [name].  1. Bronchiectasis without complication (CMS-HCC)  - dornase alfa (PULMOZYME) 1 mg/mL nebulizer solution; Inhale 2.5 mg daily. Use at least 30-60 minutes before airway clearance, or after airway clearance  Dispense: 225 mL; Refill: 3    Pharmacy sent to: [facility] (Gets [employer] via [employer] MFG assistance)    For follow up:  ?? Requested Jasmine to determine when his MFG needs renewal.    Anell Barr, PharmD, BCPS, CPP  Clinical Pharmacist Practitioner  [facility]

## 2019-04-20 ENCOUNTER — Ambulatory Visit: Payer: 59

## 2019-04-21 NOTE — Unmapped (Signed)
Bronchiectasis Clinic Pharmacist Note     Purpose: Medication Management - Documenting patient's [employer] MFG Approval per Jasmine's message:  The [employer] MFG assistance program is active until the patient does not meet the eligiblity requirements. ??   Just an FYI-every few years the program will need forms renewed:   ?? April 2022-[employer] will reach out to patient to re-new patient forms   ?? May 2023-[employer] will reach out to provider to re-new provider forms.     Electronically signed:  Anell Barr, PharmD, BCPS, CPP  Clinical Pharmacist Practitioner  [facility]

## 2019-04-29 ENCOUNTER — Ambulatory Visit: Payer: 59

## 2019-05-10 DIAGNOSIS — A498 Other bacterial infections of unspecified site: Principal | ICD-10-CM

## 2019-05-10 DIAGNOSIS — J479 Bronchiectasis, uncomplicated: Principal | ICD-10-CM

## 2019-05-10 MED ORDER — LC PLUS MISC
5 refills | 0 days | Status: CP
Start: 2019-05-10 — End: ?

## 2019-05-10 NOTE — Unmapped (Signed)
[facility] Shared [facility] Clinical Assessment & Refill Coordination Note    Jordan [name], DOB: September 12, 1955  Phone: [phone] (home)     All above HIPAA information was verified with patient.     Was a Nurse, learning disability used for this call? No    Specialty Medication(s):   CF/Pulmonary: -Nebulized colistin (150mg  vials) and supply kit     Current Outpatient Medications   Medication Sig Dispense Refill   ??? albuterol 2.5 mg /3 mL (0.083 %) nebulizer solution Inhale 2.5 mg by nebulization Four (4) times a day.     ??? albuterol HFA 90 mcg/actuation inhaler INHALE 2 PUFFS INTO LUNGS EVERY 4 HOURS AS NEEDED FOR WHEEZING OR SHORTNESS OF BREATH. 6.7 Inhaler 3   ??? alcohol swabs PadM USE AS DIRECTED WITH INHALED ANTIBIOTICS 100 each 5   ??? ascorbic acid (CHEWABLE VITAMIN C) 250 mg Chew Chew 1,000 mg Two (2) times a day.      ??? azithromycin (ZITHROMAX) 250 MG tablet TAKE 1 TABLET BY MOUTH EVERY DAY 30 tablet 11   ??? BENEFIBER, GUAR GUM, ORAL Take 1 Act by mouth Two (2) times a day. Take 1 tablespoon BID     ??? colistimethate (COLYMYCIN) 150 mg injection Inject 2mL sterile water for injection to mix colistin vial, then draw up 2mL (150mg ) and inhale 2 times a day,28 days on and 28 days off. 60 each 5   ??? dornase alfa (PULMOZYME) 1 mg/mL nebulizer solution Inhale 2.5 mg daily. Use at least 30-60 minutes before airway clearance, or after airway clearance 225 mL 3   ??? ferrous sulfate (IRON, FERROUS SULFATE,) 325 (65 FE) MG tablet Take 325 mg by mouth Two (2) times a day.     ??? HYDROcodone-acetaminophen (NORCO) 7.5-325 mg per tablet Take 1 tablet by mouth three (3) times a day (at 6am, noon and 6pm).     ??? HYDROcodone-homatropine (HYCODAN) 5-1.5 mg/5 mL syrup Take 5 mL by mouth every four (4) hours as needed for cough. 120 mL 0   ??? montelukast (SINGULAIR) 10 mg tablet TAKE 1 TABLET BY MOUTH EVERY DAY AT NIGHT 90 tablet 0   ??? nebulizers (LC PLUS) Misc Provide 1 device  for use with inhaled medication.(Colistin) 1 each 5 ??? omeprazole (PRILOSEC) 20 MG capsule Take 20 mg by mouth Two (2) times a day.      ??? sterile water, PF, Soln Use 2mL to mix Colistin,then add additional 2mL to neb cup with 2mL of mixed colistin.Inhale twice daily 28 days on and 28 days off. 600 mL 5   ??? SYMBICORT 160-4.5 mcg/actuation inhaler INHALE 2 PUFFS INTO THE LUNGS 2 TIMES A DAY. 30.6 Inhaler 1   ??? syringe with needle (BD LUER-LOK SYRINGE) 3 mL 20 gauge x 1 Syrg For use with inhaled antibiotic (Colistin) 60 each 3   ??? syringe with needle 3 mL 20 gauge x 1 1/2 Syrg For use with inhaled antibiotic (Colistin) 60 each 5   ??? tobramycin, PF, ([name]) 300 mg/5 mL nebulizer solution Inhale 5 mL (300 mg total) by nebulization every twelve (12) hours. 28 days on and 28 days off. 280 mL 5     No current facility-administered medications for this visit.         Changes to medications: Sirius reports no changes at this time.    Allergies   Allergen Reactions   ??? Gentamicin Other (See Comments)     BALANCE ISSUES   ??? Tobramycin Other (See Comments)  ototoxicity   ??? Morphine Nausea And Vomiting       Changes to allergies: No    SPECIALTY MEDICATION ADHERENCE     Colistin 150 mg: 0 days of medicine on hand   *  Medication Adherence    Patient reported X missed doses in the last month: 0  Specialty Medication: colistin 150mg   Patient is on additional specialty medications: No  Informant: patient  Support network for adherence: family member          Specialty medication(s) dose(s) confirmed: Regimen is correct and unchanged.     Are there any concerns with adherence? No    Adherence counseling provided? Not needed    CLINICAL MANAGEMENT AND INTERVENTION      Clinical Benefit Assessment:    Do you feel the medicine is effective or helping your condition? Yes    Clinical Benefit counseling provided? Not needed    Adverse Effects Assessment:    Are you experiencing any side effects? No    Are you experiencing difficulty administering your medicine? No    Quality of Life Assessment:    How many days over the past month did your pseudamonas infection  keep you from your normal activities? For example, brushing your teeth or getting up in the morning. 0    Have you discussed this with your provider? Not needed    Therapy Appropriateness:    Is therapy appropriate? Yes, therapy is appropriate and should be continued    DISEASE/MEDICATION-SPECIFIC INFORMATION      N/A    PATIENT SPECIFIC NEEDS     ? Does the patient have any physical, cognitive, or cultural barriers? No    ? Is the patient high risk? No     ? Does the patient require a Care Management Plan? No     ? Does the patient require physician intervention or other additional services (i.e. nutrition, smoking cessation, social work)? No      SHIPPING     Specialty Medication(s) to be Shipped:   CF/Pulmonary: -Nebulized colistin (150mg  vials) and supply kit    Other medication(s) to be shipped: syringes, alcohol swab, sterile water, neb cup     Changes to insurance: No    Delivery Scheduled: Yes, Expected medication delivery date: 2/23.     Medication will be delivered via UPS to the confirmed prescription address in [place] [facility].    The patient will receive a drug information handout for each medication shipped and additional FDA Medication Guides as required.  Verified that patient has previously received a Conservation officer, historic buildings.    All of the patient's questions and concerns have been addressed.    Julianne Rice   [facility] Shared [facility] Specialty Pharmacist

## 2019-05-11 ENCOUNTER — Ambulatory Visit: Payer: 59

## 2019-05-15 MED FILL — WATER FOR INJECTION, STERILE INJECTION SOLUTION: 30 days supply | Qty: 600 | Fill #4 | Status: AC

## 2019-05-15 MED FILL — BD LUER-LOK SYRINGE 3 ML 20 GAUGE X 1": 30 days supply | Qty: 60 | Fill #1

## 2019-05-15 MED FILL — ALCOHOL PREP PADS: 50 days supply | Qty: 100 | Fill #4

## 2019-05-15 MED FILL — LC PLUS MISC: 30 days supply | Qty: 1 | Fill #0 | Status: AC

## 2019-05-15 MED FILL — COLISTIN (COLISTIMETHATE SODIUM) 150 MG SOLUTION FOR INJECTION: 30 days supply | Qty: 60 | Fill #4

## 2019-05-15 MED FILL — ALCOHOL PREP PADS: 50 days supply | Qty: 100 | Fill #4 | Status: AC

## 2019-05-15 MED FILL — LC PLUS MISC: 30 days supply | Qty: 1 | Fill #0

## 2019-05-15 MED FILL — BD LUER-LOK SYRINGE 3 ML 20 GAUGE X 1": 30 days supply | Qty: 60 | Fill #1 | Status: AC

## 2019-05-15 MED FILL — COLISTIN (COLISTIMETHATE SODIUM) 150 MG SOLUTION FOR INJECTION: 30 days supply | Qty: 60 | Fill #4 | Status: AC

## 2019-05-22 DIAGNOSIS — J479 Bronchiectasis, uncomplicated: Principal | ICD-10-CM

## 2019-05-23 MED ORDER — ALBUTEROL SULFATE HFA 90 MCG/ACTUATION AEROSOL INHALER
3 refills | 0 days | Status: CP
Start: 2019-05-23 — End: ?

## 2019-05-29 DIAGNOSIS — J3089 Other allergic rhinitis: Principal | ICD-10-CM

## 2019-05-29 MED ORDER — MONTELUKAST 10 MG TABLET
ORAL_TABLET | 0 refills | 0 days | Status: CP
Start: 2019-05-29 — End: ?

## 2019-06-06 ENCOUNTER — Ambulatory Visit: Admit: 2019-06-06 | Discharge: 2019-06-07 | Payer: MEDICARE | Attending: Pharmacist | Primary: Pharmacist

## 2019-06-06 ENCOUNTER — Encounter: Admit: 2019-06-06 | Discharge: 2019-06-07 | Payer: MEDICARE | Attending: Internal Medicine | Primary: Internal Medicine

## 2019-06-06 ENCOUNTER — Encounter: Admit: 2019-06-06 | Discharge: 2019-06-07 | Payer: MEDICARE

## 2019-06-06 DIAGNOSIS — J3089 Other allergic rhinitis: Principal | ICD-10-CM

## 2019-06-06 DIAGNOSIS — J479 Bronchiectasis, uncomplicated: Principal | ICD-10-CM

## 2019-06-06 MED ORDER — SODIUM CHLORIDE 10 % FOR NEBULIZATION
Freq: Two times a day (BID) | RESPIRATORY_TRACT | 3 refills | 90.00000 days | Status: CP
Start: 2019-06-06 — End: ?
  Filled 2019-06-08: qty 900, 30d supply, fill #0

## 2019-06-06 MED ORDER — PARI LC D NEBULIZER MISC
3 refills | 0.00000 days | Status: CP
Start: 2019-06-06 — End: ?

## 2019-06-06 MED ORDER — MONTELUKAST 10 MG TABLET
ORAL_TABLET | Freq: Every evening | ORAL | 3 refills | 90.00000 days | Status: CP
Start: 2019-06-06 — End: ?

## 2019-06-06 NOTE — Unmapped (Signed)
Pulmonary rehab referral placed.    Waiting to hear back from Dr. Dudley Major about surgery for transplant.        Symptoms of a bronchiectasis exacerbation: 48 hours of at least three of the following:  ?? Increased cough  ?? Change in volume or appearance of sputum  ?? Increased sputum purulence  ?? Worsening shortness of breath and/or exercise tolerance  ?? Fatigue and/or malaise  ?? Coughing up blood (hemoptysis)    If you are experiencing some of these symptoms:  ?? Increase the frequency and/or intensity of your airway clearance  ?? Try to submit a sputum sample for bacterial and AFB cultures (at [facility] or locally)  ?? Reach out to me or your local physician.  ?? If you need antibiotics, recommend treating for 14 days.      Please bring any new airway clearance equipment to your next visit to ensure that you are using and caring for it properly.    Information about the COVID-19 vaccines: yourshot.org    Interested in participating in COVID-19 vaccine trials? https://www.coronaviruspreventionnetwork.org    Thank you for allowing me to be a part of your care. Please call the clinic with any questions.    Viona Gilmore, MD, MPH  Pulmonary and Critical Care Medicine  [address]  [id number]  [place], [place] [address]    Thank you for your visit to the [facility]. You may receive a survey from [facility] regarding your visit today, and we are eager to use this feedback to improve your experience. Thank you for taking the time to fill it out.    Between appointments, you can reach [place] at these numbers:  ? For appointments: [phone]  ? For my nurse, Arsenio Loader RN: [phone]  ? Fax: [phone]  ? For urgent issues after hours: Hospital Operator @ [phone] & ask for Pulmonary Fellow on call    For further information, check out the websites below:    [facility] For [facility] for Bronchiectasis: ScienceMakers.nl    Impact Airway Clearance Education: http://www.impact-be.com    Videos from the patient session of the World Bronchiectasis Conference in [place] on October 03, 2016 can be watched here (TrustyNews.es).     Bronchiectasis Toolbox (information about airway clearance): DiningCalendar.de    Copy for Individuals with Bronchiectasis and/or NTM through the Bronchiectasis Registry and the COPD Foundation: https://www.mckee-young.org/    Information about Non-tuberculous Mycobacterial Infections:  https://www.ntminfo.org     Interested in clinical trials and other research opportunities?  www.clinicaltrials.gov    The Rare Diseases Clinical Research Network [facility]) Genetic Disorders of  Mucociliary Clearance Consortium [facility]) Contact Registry is a way for patients with disorders of mucociliary clearance (such as bronchiectasis) and their family members to learn about research studies they may be able to join. Participation is completely voluntary and you may choose to withdraw at any time. There is no cost to join the [employer]. For more information or to join the registry please go to the following website:  [email or web address].hu

## 2019-06-06 NOTE — Unmapped (Signed)
Bronchiectasis Clinic Pharmacist Visit     Jordan [name] is a 64 y.o. male with bronchiectasis, seen for for medication review. Reports he was able to get his COVID vaccine. Currently enrolled in [employer] program for [employer] access through 06/2020.     COVID-19 Vaccine Status: Vaccinated: Yes - Pfizer, 1st dose completed (04/20/19 [id number]), 2nd dose completed (05/18/19 [id number])     Patient reported:  ? Barriers to adherence: Not applicable  ? Side Effects reported: No   ? Pharmacy insurance status: Medicare Part D   ? [facility] ([facility]), [facility] (Colistin, HTS 10%), [facility] (tobramycin nebs)    MEDICATION REVIEW   Reviewed the indication, dose, and frequency of each medication with patient.   Medication Sig Comments/Adherence   ??? albuterol 2.5 mg /3 mL (0.083 %) nebulizer solution Inhale 2.5 mg by nebulization Two (2) times a day.  Taking   ??? albuterol HFA 90 mcg/actuation inhaler INHALE 2 PUFFS INTO LUNGS EVERY 4 HOURS AS NEEDED FOR WHEEZING OR SHORTNESS OF BREATH. Taking, PRN, usually at least twice a day upon exertion   ??? ascorbic acid, vitamin C, (VITAMIN C) 1000 MG tablet Take 1,000 mg by mouth daily. Taking   ??? azithromycin (ZITHROMAX) 250 MG tablet TAKE 1 TABLET BY MOUTH EVERY DAY Taking   ??? colistimethate (COLYMYCIN) 150 mg injection Inject 2mL sterile water for injection to mix colistin vial, then draw up 2mL (150mg ) and inhale 2 times a day,28 days on and 28 days off. Taking, on cycle   ??? dornase alfa (PULMOZYME) 1 mg/mL nebulizer solution Inhale 2.5 mg daily. Use at least 30-60 minutes before airway clearance, or after airway clearance Taking   ??? ferrous sulfate (IRON, FERROUS SULFATE,) 325 (65 FE) MG tablet Take 325 mg by mouth Two (2) times a day. Taking   ??? HYDROcodone-acetaminophen (NORCO) 7.5-325 mg per tablet Take 1 tablet by mouth three (3) times a day (at 6am, noon and 6pm). Taking   ??? HYDROcodone-homatropine (HYCODAN) 5-1.5 mg/5 mL syrup Take 5 mL by mouth every four (4) hours as needed for cough. PRN   ??? montelukast (SINGULAIR) 10 mg tablet Take 1 tablet (10 mg total) by mouth nightly. Taking   ??? multivitamin-minerals-lutein Tab Take 1 tablet by mouth daily. Taking   ??? omeprazole (PRILOSEC) 20 MG capsule Take 20 mg by mouth Two (2) times a day.  Taking   ??? sodium chloride 10 % Nebu Inhale 5 mL by nebulization Two (2) times a day. Taking, expired/fell off his list, added back on   ??? SYMBICORT 160-4.5 mcg/actuation inhaler INHALE 2 PUFFS INTO THE LUNGS 2 TIMES A DAY. 30.6 Inhaler   ??? tobramycin, PF, ([name]) 300 mg/5 mL nebulizer solution Inhale 5 mL (300 mg total) by nebulization every twelve (12) hours. 28 days on and 28 days off. Taking, off cycle     RECOMMENDATIONS     BRONCHIECTASIS   PFTs today have declined over time, FEV1 34% predicted today.  ? Airway clearance: Completing as prescribed. Renewed HTS 10% BID today.  ? ID/Antibiotics: Chronically grows Mucoid P. aeruginosa, OPF. Last needed oral/IV antibiotics Dec 2020 (ciprofloxacin 750mg  BID). Requires about 2 courses of antibiotics in the last year. Cycles inhaled antibiotics: inhaled colistin and inhaled tobramycin. On chronic azithromycin.  o Continue medications as prescribed.    MEDICATION MANAGEMENT   ? Adherence: Excellent compliance  ? Access: No issues related to access   ? Prescription Renewals: Sent to pharmacy: HTS 10% to [facility]  [facility]    MEDICATION CHANGES AFTER VISIT:  ? No changes were made this visit.    FOLLOW UP   ? CPP Visit: PRN    Time spent with patient: 20 minutes  Recommendations and medication-related problems were discussed directly with pulmonologist, Viona Gilmore    Electronically signed:  Alton Revere, PharmD, BCPS, CPP  Clinical Pharmacist Practitioner  [facility]    I am located on-site and the patient is located on-site for this visit.

## 2019-06-06 NOTE — Unmapped (Signed)
Sputum specimen collected for AFB and Lower Respiratory cultures, delivered to lab via lab tube system.

## 2019-06-06 NOTE — Unmapped (Signed)
[facility] Pulmonary Diseases and Critical Care Medicine    06/06/19    12:39 PM  -  Assessment:      Patient:Jordan [name] ([id number])    [name] is a 64 y.o. male who is seen for follow up of bronchiectasis with chronic Pseudomonas colonization  His FVC has improved from prior but his FEV1 remained stable after slow decline following resection.  Two recent 6-minute walk test that failed to reveal evidence of exertional hypoxemia.  Respiratory therapist who performed second test noted that Jordan [name]'s home pulse oximeter reads 3 to 4% lower than the facility pulse oximeter.  All his recent chest CT scan does show suggestive of a worsening infection, this was obtained within a few weeks of being treated for an exacerbation.  Unclear if this represents a Jordan baseline or an acute change.     Plan:      Problem List Items Addressed This Visit        Respiratory    Bronchiectasis (CMS-HCC) - Primary    Relevant Orders    Ambulatory referral to Pulm Rehab    AFB SMEAR (Completed)    AFB culture    Lower Respiratory Culture        No changes were made to his airway clearance regimen or inhaled therapies.  He will continue his inhaled antibiotics as well as chronic azithromycin therapy.    He was able to expectorate a sputum sample for bacterial and AFB cultures.  There are standing orders in our system.    I placed a referral to a local pulmonary rehab program.  I think this will help improve Jordan [name]'s dyspnea.    We discussed whether lung transplantation was an option for Jordan [name].  While he is not currently on oxygen, I am deeply concerned about his continued decline in lung function in the absence of repeated exacerbations.  I reviewed his case with Jordan [name]. Baldo [name].  Since Jordan [name] had low-grade prostate cancer and has no evidence of disease following his diagnosis and surgery in 2019, this would not prevent him from being a transplant candidate.  After his visit today, I was able to again connect with Jordan [name]. Dudley [name] who spoke with Jordan [name]. Oneida [name], one of our transplant surgeons who had performed 1 of Jordan [name]'s pulmonary resections.  Due to his prior resections and development of scar tissue, lung transplantation would be far more complicated for him.  By the time he is a transplant candidate due to progression of his lung disease, he would be older and less apt to tolerate the surgery.  Therefore, he is not considered a candidate here at [name].  We could pursue evaluation at another transplant center should Jordan [name] request this.      Return in about 4 months (around 10/06/2019) for Recheck and FVL.    The above plan was discussed with the patient and he is in agreement.       Subjective:      HPI: [name] is a 64 y.o. male who is seen in a video telehealth visit for follow up of bronchiectasis.    09/27/18:  I last saw him on 05/17/18. He reached out at the end of May reporting symptoms c/w an exacerbation. I treated him with a two week course of Ciprofloxacin, which seemed to help.  Reports that he feels winded too much - if walks up incline or up a couple hundred feet. This has worsened over the past year. Has to stop and let lungs catch up.  Of note, he has been off testosterone supplementation for the past year after he was diagnosed with prostate cancer.    He received the Jordan [name], which he is using with nebulized HTS 7% and Pulmozyme. After using the Vest for 6-8 weeks, he noticed improvement with more phlegm mobilization. Sometimes has to pause it to cough to get stuff up.  Plugging feeling happening a lot more, especially with the vest.  Most of the time, sputum is thinned out with Pulmozyme but still gets those plugs.    Had to stop eating ice cream or drinking milkshake - will cough will eating and that makes him vomit on occasion. Past few weeks, when eating will cough, no matter what he eats.  Happens almost once a day. Doesn't feel like things get stuck typically.      02/07/19:  Following his last visit, Jordan [name] reached out regarding portable oxygen.  We had considered the life 2000 but he does not qualify because he does not use supplemental oxygen.  He performed the 6-minute walk test prior to today's visit but did not desaturate.  He wonders if this is because the test was done in the morning and he doesn't have breathing issues in the morning.  Typically, breathing issues occur in afternoon.  During these episodes, he will have to use his inhaler and feels like he is suffocating.  He describes it as an uncomfortable feeling that limits his ability to do things with family.  If he is having trouble catching breath, oximetry is 92-93%. Has seen it in the 80s if comes in from outside and is short of breath.    For airway clearance, he is nebulizing 10% hypertonic saline and using his percussive vest for 20 minute sessions in AM and PM. Has to pause it to cough and get stuff up.  Coughing up more stuff since last visit and is waking up 1-2 times a night with cough.having coughing fits between clearance session.  What he expectorates is less of a mucous plug and mostly stringy sputum that is yellowish to light brown in color.  No hemoptysis.  On Saturday, he had a coughing fit that nearly triggered posttussive emesis.  He only expectorated about 3-4 globs of phlegm.  Nebulizing Pulmozyme daily.  Alternates [name] and colistin every other month.  On [name] this month and will start Colistin on Dec 1.    06/06/19:  Coughing more and more sputum.  Waking up 1-2 times per night coughing. Can cough once or just keep coughing.  Sputum is yellow in color without evidence of blood.  He is not expectorating green sputum currently. On colistin this month; alternates with [name]. Went through January off all inhaled antibiotics.  Oxygen levels in the low 90s.  Uses rescue inhaler if goes up stairs.  Regularly performing airway clearance with nebulized 10% hypertonic saline twice a day, percussive vest twice a day, and Pulmozyme daily. He last received IV antibiotics in January 2019 but has been treated with oral antibiotics on 04/19/18 and 08/17/18, 03/10/19.      Past Medical History:   Diagnosis Date   ??? Abscess of lung (CMS-HCC) 11/06/2010    CT Chest 10/22/10 12/05/2010 right thoracotomy resection of right middle lobe and resection of right lower lobe abscess    ??? Arthritis     back   ??? Biceps tendon tear 2013    left side   ??? Bronchiectasis (CMS-HCC)     chronic psuedomonas infection   ???  Degenerative joint disease of left acromioclavicular joint    ??? GERD (gastroesophageal reflux disease)    ??? Hypertension    ??? Obstructive sleep apnea on CPAP 03/14/13    AHI 33.5, on BiPAP 12/8 based on sleep study 09/2013   ??? Pneumonia 2012    Lung abcess    ??? PONV (postoperative nausea and vomiting)    ??? Prostate cancer (CMS-HCC) 09/09/2017   ??? Rotator cuff injury left   ??? Vertigo        Past Surgical History:   Procedure Laterality Date   ??? APPENDECTOMY  1969   ??? BACK SURGERY     ??? BRONCHOSCOPY  10/19/2010    [facility]   ??? CARPAL TUNNEL RELEASE  1999   ??? LUNG REMOVAL, PARTIAL Right 10/2010    RML and RLL partially resected   ??? MENISCECTOMY  2011   ??? NECK SURGERY  1993, 1999, 2002   ??? PR GERD TST W/ MUCOS IMPEDE ELECTROD,>1HR N/A 07/06/2012    Procedure: ESOPHAGEAL FUNCTION TEST, GASTROESOPHAGEAL REFLUX TEST W/ NASAL CATHETER INTRALUMINAL IMPEDANCE ELECTRODE(S) PLACEMENT, RECORDING, ANALYSIS AND INTERPRETATION; PROLONGED;  Surgeon: None None;  [name];  Service: Gastroenterology   ??? PR OPEN TREAT RIB FRACTURE W/INT FIXATION, UNILATERAL, 1-2 RIBS Right 03/19/2015    Procedure: OPEN TREATMENT OF RIB FRACTURE REQUIRING INTERNAL FIXATION, UNILATERAL; 1-2 RIBS;  Surgeon: Evert Kohl, MD;  Location: MAIN OR [name];  Service: Cardiothoracic   ??? PR THORACOTOMY W/THERAP WEDGE RESEXN ADDL IPSILATRL Right 08/07/2013    Procedure: THORACOTOMY; WITH THERAPEUTIC LOBECTOMY OF RIGHT MIDDLE AND RIGHT LOWER LOBE RESECTION , EACH ADDITIONAL RESECTION, IPSILATERAL;  Surgeon: Alvester Chou, MD;  Location: MAIN OR [name];  Service: Cardiothoracic   ??? TONSILLECTOMY  1965       Family History   Problem Relation Age of Onset   ??? Alzheimer's disease Mother    ??? Stroke Mother    ??? Bronchiectasis  Brother         presumed   ??? Bronchiectasis  Brother         presumed   ??? Liver disease Father    ??? Diabetes Father    ??? Heart failure Father    ??? Bronchiectasis  Daughter    ??? Asthma Son         childhood asthma   ??? Clotting disorder Neg Hx    ??? Anesthesia problems Neg Hx        Social History     Tobacco Use   ??? Smoking status: Never Smoker   ??? Smokeless tobacco: Never Used   Substance Use Topics   ??? Alcohol use: No     Alcohol/week: 0.0 standard drinks   ??? Drug use: No       Allergies  Reviewed on 05/10/2019      Reactions Comment    Gentamicin Other (See Comments) BALANCE ISSUES    Tobramycin Other (See Comments) ototoxicity    Morphine Nausea And Vomiting         Current Outpatient Medications   Medication Sig Dispense Refill   ??? albuterol 2.5 mg /3 mL (0.083 %) nebulizer solution Inhale 2.5 mg by nebulization Two (2) times a day.      ??? albuterol HFA 90 mcg/actuation inhaler INHALE 2 PUFFS INTO LUNGS EVERY 4 HOURS AS NEEDED FOR WHEEZING OR SHORTNESS OF BREATH. 6.7 g 3   ??? alcohol swabs PadM USE AS DIRECTED WITH INHALED ANTIBIOTICS 100 each 5   ???  ascorbic acid, vitamin C, (VITAMIN C) 1000 MG tablet Take 1,000 mg by mouth daily.     ??? azithromycin (ZITHROMAX) 250 MG tablet TAKE 1 TABLET BY MOUTH EVERY DAY 30 tablet 11   ??? colistimethate (COLYMYCIN) 150 mg injection Inject 2mL sterile water for injection to mix colistin vial, then draw up 2mL (150mg ) and inhale 2 times a day,28 days on and 28 days off. 60 each 5   ??? dornase alfa (PULMOZYME) 1 mg/mL nebulizer solution Inhale 2.5 mg daily. Use at least 30-60 minutes before airway clearance, or after airway clearance 225 mL 3   ??? ferrous sulfate (IRON, FERROUS SULFATE,) 325 (65 FE) MG tablet Take 325 mg by mouth Two (2) times a day. BID     ??? HYDROcodone-acetaminophen (NORCO) 7.5-325 mg per tablet Take 1 tablet by mouth three (3) times a day (at 6am, noon and 6pm).     ??? HYDROcodone-homatropine (HYCODAN) 5-1.5 mg/5 mL syrup Take 5 mL by mouth every four (4) hours as needed for cough. 120 mL 0   ??? montelukast (SINGULAIR) 10 mg tablet Take 1 tablet (10 mg total) by mouth nightly. 90 tablet 3   ??? multivitamin-minerals-lutein Tab      ??? nebulizers (LC PLUS) Misc use with Colistin 1 each 5   ??? nebulizers (PARI LC D NEBULIZER) Misc With HTS 10% 3 each 3   ??? omeprazole (PRILOSEC) 20 MG capsule Take 20 mg by mouth Two (2) times a day.      ??? sodium chloride 10 % Nebu Inhale 5 mL by nebulization Two (2) times a day. 900 mL 3   ??? sterile water, PF, Soln Use 2mL to mix Colistin,then add additional 2mL to neb cup with 2mL of mixed colistin.Inhale twice daily 28 days on and 28 days off. 600 mL 5   ??? SYMBICORT 160-4.5 mcg/actuation inhaler INHALE 2 PUFFS INTO THE LUNGS 2 TIMES A DAY. 30.6 Inhaler 1   ??? syringe with needle (BD LUER-LOK SYRINGE) 3 mL 20 gauge x 1 Syrg For use with inhaled antibiotic (Colistin) 60 each 3   ??? tobramycin, PF, ([name]) 300 mg/5 mL nebulizer solution Inhale 5 mL (300 mg total) by nebulization every twelve (12) hours. 28 days on and 28 days off. 280 mL 5     No current facility-administered medications for this visit.      Physical Exam:   BP 128/79  - Pulse 94  - Temp 36.7 ??C (98.1 ??F) (Temporal)  - Resp 16  - Ht 174 cm (5' 8.5)  - Wt 81.6 kg (180 lb)  - SpO2 92% Comment: room air - BMI 26.97 kg/m??   Well appearing white male in no acute distress, in good spirits. Easy work of breathing without accessory muscle use. Speaking in full sentences.  Crackles appreciated over left base.  Decreased breath sounds over right base, consistent with prior resection.  No audible wheezing.  Regular rate and rhythm with normal S1 and S2.  No murmurs, rubs, or gallops.  Clubbing present and unchanged from prior.  No lower extremity edema.    Diagnostic Review:   The following data were reviewed during this visit with key findings summarized below:    Pulmonary Function Testing:  Spirometry obtained today consistent with severe airway obstruction and moderate restriction.  FVC has significantly improved from 01/26/19.   FVC (% predicted) FEV1 (% predicted) FEV1/FVC   11/10/2013 3.19 L (68%) 2.16 L (61%)  68%   02/23/2014 3.50 L (76%)  2.23  L (64%)  64%   07/12/2014  2.94 L (66%)  2.02 L (58%)  69%   01/15/2015  2.82 L (62%)  1.79 L (52%)  63%   02/08/2015  3.04 L (66%)  2.06 L (59%)  68%   04/02/2015  2.86 L (63%)  1.83 L (53%)  64%   12/10/2015  2.77 L (62%)  1.62 L (48%)  58%   01/27/2016  2.42 L (54%)  1.55 L (46%)  64%   03/24/2016  3.36 L (75%)  1.99 L (59%)  59%   07/21/2016  2.56 L (57%)  1.54 L (46%)  60%   12/29/2016  2.80 L (63%)  1.57 L (47%)  56%   04/06/2017  2.61 L (59%)  1.46 L (44%)  56%   07/06/2017  2.56 L (58%)  1.49 L (45%)  58%   10/12/2017  2.63 L (59%)  1.47 L (44%)  56%   01/18/2018  2.68 L (63%)  1.36 L (42%)  51%   05/17/2018 2.49 L (57%)  1.25 L (38%)  50%   09/26/2018 2.43 L (57%)  1.26 L (39%)  52%   01/26/2019 2.16 L (51%)  1.16 L (36%)  54%   06/06/2019 2.47 L (59%) 1.11 L (34%) 45%     6-Minute Walk (02/15/19): Resting Heart Rate was 91. Maximum Heart Rate achieved 119. Patient did reach target heart rate (86-133). Resting O2 Saturation was 98% on RA. Resting BORG score was 1. O2 saturation nadir was 91% at minute 6. Total 6 minute walk distance was 381 meters. Predicted 6 minute walk distance of 558 m (LLN of 455m). Percent of predicted 6 minute walk distance of 68%. Heart rate returned to 96 bpm after 2 minutes of recovery. O2 saturation returned to 96% on RA after 2 minutes of recovery. Maximum BORG score was 7 at minute 6. Concluding BORG score was 2 after 2 minutes of recovery. No evidence of resting or exercise induced hypoxemia. No supplemental oxygen is required at this time.    6-Minute Walk (01/26/19): Resting Heart Rate was 86. Maximum Heart Rate achieved 97. Patient did reach target heart rate (86-133). Resting O2 Saturation was 99% on RA. Resting BORG score was 1. O2 saturation nadir was 95% at minutes 3 and 6. Total 6 minute walk distance was 350 meters. Predicted 6 minute walk distance of 558 m (LLN of 435m). Percent of predicted 6 minute walk distance of 63%. Heart rate returned to 86 bpm after 2 minutes of recovery. O2 saturation returned to 96% on RA after 2 minutes of recovery. Maximum BORG score was 6 at minutes 5 and 6. Concluding BORG score was 1 after 2 minutes of recovery. No evidence of resting or exercise induced hypoxemia. No supplemental oxygen is required at this time.    Cultures:  ?? Source Bacterial Culture AFB Smear AFB Culture   04/02/15 Sputum 2+ OPF; 1+ Probably Mucoid PsA Negative Negative   07/02/15 Sputum 4+ OPF; 3+ Mucoid PsA; 3+ Smooth PsA; 1+ Steno Negative Negative   01/23/16 Sputum 2+ OPF; 1+ Mucoid PsA; 1+ Smooth PsA - -   03/24/16 Sputum 3+ OPF; 3+ Mucoid PsA; 3+ Smooth PsA Negative Overgrown   07/21/16 Sputum 4+ OPF; 4+ Mucoid PsA; 2+ Smooth PsA Negative Negative   12/29/16 Sputum 4+ OPF; 3+ Mucoid PsA; 3+ Smooth PsA Negative Actinomadura sp   04/06/17 Sputum 3+ OPF; 4+ Mucoid PsA Negative Negative   07/06/17 Sputum 4+ OPF; 3+ Mucoid PsA Negative Negative  10/12/17 Sputum 4+ OPF; 3+ Mucoid PsA Negative Negative   01/18/18 Sputum OPF Negative Negative   05/17/18 Sputum 4+ OPF; 3+ Mucoid PsA - -   09/26/18 Sputum 4+ OPF; 3+ Mucoid PsA Negative Negative   01/26/19 Sputum 4+ OPF; 2+ Mucoid PsA Negative Negative   02/15/19 Sputum 3+ OPF; 3+ Mucoid PsA Negative Negative   ??    Bronchiectasis Evaluation   IgG with subclasses:   Lab Results   Component Value Date/Time    IGGT 996 04/28/2012 06:21 AM    IGG1 402 04/28/2012 06:21 AM    IGG2 399 04/28/2012 06:21 AM    IGG3 148.0 (H) 04/28/2012 06:21 AM    IGG4 47.7 04/28/2012 06:21 AM     IgA (09/21/11): 283   IgM (09/21/11): 52   IgE:   Lab Results   Component Value Date/Time    IGE 10.5 01/22/2016 09:53 PM     Specific Titers: No results found for: TETANUSAB, TETGV, DIPHTERIAAB, DIPGV, S. pneumonia titers  HIV: No results found for: HIV  Autoimmune: No results found for: ANA, ANATITER1, PAT1, ANATITER2, PAT2, DSDNAAB, ENAS, RF, CCPAB, CCPIGG  ANCA: No results found for: ANCA, IFA, PR3ELISA, PR3QT, MPO, MPOQT  CBC-D:   Lab Results   Component Value Date/Time    WBC 5.4 04/19/2017 05:45 AM    WBC 7.9 08/13/2013 03:57 AM    HGB 14.1 04/19/2017 05:45 AM    HGB 14.8 03/24/2015 05:31 PM    HCT 43.9 04/19/2017 05:45 AM    HCT 30.8 (L) 08/13/2013 03:57 AM    PLT 140 (L) 04/19/2017 05:45 AM    PLT 292 08/13/2013 03:57 AM    NEUTROABS 3.2 04/19/2017 05:45 AM    NEUTROABS 4.9 06/21/2013 09:31 PM    LYMPHSABS 1.4 (L) 04/19/2017 05:45 AM    LYMPHSABS 1.9 06/21/2013 09:31 PM    EOSABS 0.2 04/19/2017 05:45 AM    EOSABS 0.2 06/21/2013 09:31 PM    BASOSABS 0.0 04/19/2017 05:45 AM    BASOSABS 0.0 06/21/2013 09:31 PM     Alpha-1 level: No results found for: A1TRYP  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    05/23/18:               Imaging  Chest CT (02/15/19): Images personally reviewed. Stable sequelae of right middle lobectomy and right lower lobe wedge resection.  Unchanged diffuse extensive bronchiectasis and bronchial wall thickening bilaterally.  Diffusely scattered tree-in-bud nodules in bilateral lung parenchyma with interval increase in tree-in-bud nodules in left lung; the constellation of these findings are consistent with sequelae of worsening chronic endobronchial infection.    Chest CT (03/31/16): Images personally reviewed. Status post right middle and right lower lobectomies. Tiny loculated basilar right hydropneumothorax, probably chronic. Moderate cylindrical and varicoid bronchiectasis throughout both lungs, with associated scattered mucoid impaction, tree-in-bud opacities and scattered peribronchovascular and subpleural consolidation in the right upper lobe. These findings are largely Jordan compared to the remote prior CT studies and most consistent with significant progression of chronic infectious bronchiolitis due to atypical mycobacterial infection (MAI). Two-vessel coronary atherosclerosis. Nonobstructing right nephrolithiasis.    Modified Barium Swallow (01/17/19): Shallow laryngeal penetration  observed with thin liquids and puree.  No aspiration identified with any of the tested consistencies.    Immunization History   Administered Date(s) Administered   ??? COVID-19 VACC,MRNA,(PFIZER)(PF)(IM) 04/20/2019, 05/18/2019   ??? INFLUENZA TIV (TRI) 2MO+ W/ PRESERV (IM) 11/13/2010, 01/07/2012   ??? INFLUENZA TIV (TRI) PF (IM) 02/20/2012, 12/09/2012   ??? Influenza Vaccine Quad (IIV4 PF) 97mo+ injectable 01/31/2016, 12/09/2017   ??? Influenza Virus Vaccine, unspecified formulation 12/25/2014, 12/01/2016   ??? Influenza Whole 12/21/2008   ??? PNEUMOCOCCAL POLYSACCHARIDE 23 10/22/2010, 02/07/2016   ??? Pneumococcal Conjugate 13-Valent 04/28/2013   ??? TdaP 03/22/2012     Portions of this record have been created using [employer]. Dictation errors have been sought, but may not have been identified and corrected.    Viona Gilmore, MD  06/06/19  12:39 PM

## 2019-06-08 MED FILL — LC PLUS MISC: 30 days supply | Qty: 1 | Fill #0 | Status: AC

## 2019-06-08 MED FILL — LC PLUS MISC: 30 days supply | Qty: 1 | Fill #0

## 2019-06-08 MED FILL — SODIUM CHLORIDE 10 % FOR NEBULIZATION: 30 days supply | Qty: 900 | Fill #0 | Status: AC

## 2019-06-09 NOTE — Unmapped (Signed)
Phoned Jordan [name] to follow-up on pulmonary rehab referral. I let him know that I had spoken to [facility] rehab that is near him and although they did not specifically have pulmonary rehab, they were abel to focus on strengthening exercises.    Also provided him with some other options but would be more in [facility] or [place].     He will call me back once he has had a chance to think about it.

## 2019-06-09 NOTE — Unmapped (Signed)
Pulmonary Rehab referral provided by Dr. Garner Nash. Will phone patient to verify/discuss location.

## 2019-07-07 DIAGNOSIS — J479 Bronchiectasis, uncomplicated: Principal | ICD-10-CM

## 2019-07-07 DIAGNOSIS — A498 Other bacterial infections of unspecified site: Principal | ICD-10-CM

## 2019-07-13 DIAGNOSIS — A498 Other bacterial infections of unspecified site: Principal | ICD-10-CM

## 2019-07-13 DIAGNOSIS — J479 Bronchiectasis, uncomplicated: Principal | ICD-10-CM

## 2019-07-13 NOTE — Unmapped (Signed)
[facility] Refill Coordination Note    Specialty Medication(s) to be Shipped:   CF/Pulmonary: -Nebulized colistin (150mg  vials) and supply kit    Other medication(s) to be shipped:   Alcohol swabs  BD Syringe  Sterile Water     Jordan [name], DOB: [birth year]  Phone: [phone] (home)       All above HIPAA information was verified with patient.     Was a Nurse, learning disability used for this call? No    Completed refill call assessment today to schedule patient's medication shipment from the [facility] [phone]).       Specialty medication(s) and dose(s) confirmed: Regimen is correct and unchanged.   Changes to medications: [name] reports no changes at this time.  Changes to insurance: No  Questions for the pharmacist: No    Confirmed patient received Welcome Packet with first shipment. The patient will receive a drug information handout for each medication shipped and additional FDA Medication Guides as required.       DISEASE/MEDICATION-SPECIFIC INFORMATION        N/A    SPECIALTY MEDICATION ADHERENCE     Medication Adherence    Patient reported X missed doses in the last month: 0  Specialty Medication: Colistimethate 150mg   Patient is on additional specialty medications: No  Informant: patient  Reliability of informant: reliable  Support network for adherence: family member            Colistimethate 150mg : 0 days worth of medication on hand. Start 07/22/19      SHIPPING     Shipping address confirmed in Epic.     Delivery Scheduled: Yes, Expected medication delivery date: 07/18/19.     Medication will be delivered via UPS to the prescription address in Epic WAM.    Jordan [name]   [facility] Specialty Technician

## 2019-07-13 NOTE — Unmapped (Signed)
Refill request received for Jordan [name]'s Colymycin.     Last prescribed 07/06/2018    Thank you.

## 2019-07-14 MED ORDER — COLISTIN (COLISTIMETHATE SODIUM) 150 MG SOLUTION FOR INJECTION
5 refills | 0.00000 days | Status: CP
Start: 2019-07-14 — End: ?
  Filled 2019-07-17: qty 60, 30d supply, fill #0

## 2019-07-14 MED ORDER — WATER FOR INJECTION, STERILE INJECTION SOLUTION
5 refills | 0 days | Status: CP
Start: 2019-07-14 — End: ?
  Filled 2019-07-17: qty 600, 30d supply, fill #0

## 2019-07-14 MED ORDER — ALCOHOL SWABS
5 refills | 0.00000 days | Status: CP
Start: 2019-07-14 — End: 2020-07-13
  Filled 2019-07-17: qty 100, 50d supply, fill #0

## 2019-07-14 NOTE — Unmapped (Signed)
[facility] Pharmacist Note     Purpose: Medication Management    Medication orders below were sent for [name].  1. Pseudomonas aeruginosa infection  - sterile water, PF, Soln; Use 2mL to mix Colistin,then add additional 2mL to neb cup with 2mL of mixed colistin.Inhale twice daily 28 days on and 28 days off.  Dispense: 600 mL; Refill: 5  - colistimethate (COLYMYCIN) 150 mg injection; Inject 2mL sterile water for injection to mix colistin vial, then draw up 2mL (150mg ) and inhale 2 times a day,28 days on and 28 days off.  Dispense: 60 each; Refill: 5  - alcohol swabs (ALCOHOL PREP PADS) PadM; USE AS DIRECTED WITH INHALED ANTIBIOTICS  Dispense: 100 each; Refill: 5  Pharmacy sent to: [facility]      Anell Barr, PharmD, BCPS, CPP  Clinical Pharmacist Practitioner  [facility]

## 2019-07-17 MED FILL — BD LUER-LOK SYRINGE 3 ML 20 GAUGE X 1 1/2": 30 days supply | Qty: 60 | Fill #0

## 2019-07-17 MED FILL — COLISTIN (COLISTIMETHATE SODIUM) 150 MG SOLUTION FOR INJECTION: 30 days supply | Qty: 60 | Fill #0 | Status: AC

## 2019-07-17 MED FILL — WATER FOR INJECTION, STERILE INJECTION SOLUTION: 30 days supply | Qty: 600 | Fill #0 | Status: AC

## 2019-07-17 MED FILL — BD LUER-LOK SYRINGE 3 ML 20 GAUGE X 1 1/2": 30 days supply | Qty: 60 | Fill #0 | Status: AC

## 2019-07-17 MED FILL — ALCOHOL PREP PADS: 50 days supply | Qty: 100 | Fill #0 | Status: AC

## 2019-08-20 DIAGNOSIS — J479 Bronchiectasis, uncomplicated: Principal | ICD-10-CM

## 2019-08-20 MED ORDER — AZITHROMYCIN 250 MG TABLET
ORAL_TABLET | 11 refills | 0 days
Start: 2019-08-20 — End: ?

## 2019-08-23 NOTE — Unmapped (Signed)
Pharmacy Refill Request    Azithromycin 250 mg     Last OV: 06/06/2019    Last written Rx: 08/01/2018    Last dispensed: 07/27/2019      Upcoming appointment: Recall appt in for July 2021    Thank you

## 2019-09-06 NOTE — Unmapped (Signed)
[facility] Clinical Assessment & Refill Coordination Note    Jordan [name], DOB: 1955/09/05  Phone: [phone] (home)     All above HIPAA information was verified with patient.     Was a Nurse, learning disability used for this call? No    Specialty Medication(s):   CF/Pulmonary: -Nebulized colistin (150mg  vials) and supply kit     Current Outpatient Medications   Medication Sig Dispense Refill   ??? albuterol 2.5 mg /3 mL (0.083 %) nebulizer solution Inhale 2.5 mg by nebulization Two (2) times a day.      ??? albuterol HFA 90 mcg/actuation inhaler INHALE 2 PUFFS INTO LUNGS EVERY 4 HOURS AS NEEDED FOR WHEEZING OR SHORTNESS OF BREATH. 6.7 g 3   ??? alcohol swabs (ALCOHOL PREP PADS) PadM Use as directed with inhaled antibiotics 100 each 5   ??? ascorbic acid, vitamin C, (VITAMIN C) 1000 MG tablet Take 1,000 mg by mouth daily.     ??? azithromycin (ZITHROMAX) 250 MG tablet TAKE 1 TABLET BY MOUTH EVERY DAY 30 tablet 11   ??? colistimethate (COLYMYCIN) 150 mg injection Inject 2mL sterile water for injection to mix colistin vial, then draw up 2mL (150mg ) and inhale 2 times a day,28 days on and 28 days off. 60 each 5   ??? dornase alfa (PULMOZYME) 1 mg/mL nebulizer solution Inhale 2.5 mg daily. Use at least 30-60 minutes before airway clearance, or after airway clearance 225 mL 3   ??? ferrous sulfate (IRON, FERROUS SULFATE,) 325 (65 FE) MG tablet Take 325 mg by mouth Two (2) times a day. BID     ??? HYDROcodone-acetaminophen (NORCO) 7.5-325 mg per tablet Take 1 tablet by mouth three (3) times a day (at 6am, noon and 6pm).     ??? HYDROcodone-homatropine (HYCODAN) 5-1.5 mg/5 mL syrup Take 5 mL by mouth every four (4) hours as needed for cough. 120 mL 0   ??? montelukast (SINGULAIR) 10 mg tablet Take 1 tablet (10 mg total) by mouth nightly. 90 tablet 3   ??? multivitamin-minerals-lutein Tab Take 1 tablet by mouth daily.      ??? nebulizers (LC PLUS) Misc use with Colistin 1 each 5   ??? nebulizers (LC PLUS) Misc Use as directed with hypertonic saline 10% 3 each 3   ??? omeprazole (PRILOSEC) 20 MG capsule Take 20 mg by mouth Two (2) times a day.      ??? sodium chloride 10 % Nebu Inhale 5 mL by nebulization Two (2) times a day. 900 mL 3   ??? sterile water, PF, Soln Use 2mL to mix Colistin,then add additional 2mL to neb cup with 2mL of mixed colistin.Inhale twice daily 28 days on and 28 days off. 600 mL 5   ??? SYMBICORT 160-4.5 mcg/actuation inhaler INHALE 2 PUFFS INTO THE LUNGS 2 TIMES A DAY. 30.6 Inhaler 1   ??? syringe with needle (BD LUER-LOK SYRINGE) 3 mL 20 gauge x 1 1/2 Syrg For use with inhaled antibiotic (Colistin) 60 each 30   ??? tobramycin, PF, ([name]) 300 mg/5 mL nebulizer solution Inhale 5 mL (300 mg total) by nebulization every twelve (12) hours. 28 days on and 28 days off. 280 mL 5     No current facility-administered medications for this visit.        Changes to medications: Hayze reports no changes at this time.    Allergies   Allergen Reactions   ??? Gentamicin Other (See Comments)     BALANCE ISSUES   ???  Tobramycin Other (See Comments)     ototoxicity   ??? Morphine Nausea And Vomiting       Changes to allergies: No    SPECIALTY MEDICATION ADHERENCE     Colistin  150 mg: 0 days of medicine on hand --starting July 1    Medication Adherence    Support network for adherence: family member          Specialty medication(s) dose(s) confirmed: Regimen is correct and unchanged.     Are there any concerns with adherence? No    Adherence counseling provided? Not needed    CLINICAL MANAGEMENT AND INTERVENTION      Clinical Benefit Assessment:    Do you feel the medicine is effective or helping your condition? Yes    Clinical Benefit counseling provided? Not needed    Adverse Effects Assessment:    Are you experiencing any side effects? No    Are you experiencing difficulty administering your medicine? No    Quality of Life Assessment:    How many days over the past month did your pseudamonas infection  keep you from your normal activities? For example, brushing your teeth or getting up in the morning. Patient declined to answer    Have you discussed this with your provider? Not needed    Therapy Appropriateness:    Is therapy appropriate? Yes, therapy is appropriate and should be continued    DISEASE/MEDICATION-SPECIFIC INFORMATION      N/A    PATIENT SPECIFIC NEEDS     - Does the patient have any physical, cognitive, or cultural barriers? No    - Is the patient high risk? No     - Does the patient require a Care Management Plan? No     - Does the patient require physician intervention or other additional services (i.e. nutrition, smoking cessation, social work)? No      SHIPPING     Specialty Medication(s) to be Shipped:   CF/Pulmonary: -Nebulized colistin (150mg  vials) and supply kit    Other medication(s) to be shipped: syringes, neb cupx2, sterile water, alcohol swab, HTS 10%     Changes to insurance: No    Delivery Scheduled: Yes, Expected medication delivery date: 6/23.     Medication will be delivered via UPS to the confirmed prescription address in [facility].    The patient will receive a drug information handout for each medication shipped and additional FDA Medication Guides as required.  Verified that patient has previously received a Conservation officer, historic buildings.    All of the patient's questions and concerns have been addressed.    Julianne Rice   [facility] Specialty Pharmacist

## 2019-09-07 MED ORDER — BD LUER-LOK SYRINGE 3 ML 20 GAUGE X 1"
ORAL | 5 refills | 0.00000 days | Status: CP
Start: 2019-09-07 — End: ?
  Filled 2019-09-08: qty 60, 30d supply, fill #0

## 2019-09-08 MED FILL — ALCOHOL PREP PADS: 50 days supply | Qty: 100 | Fill #1

## 2019-09-08 MED FILL — WATER FOR INJECTION, STERILE INJECTION SOLUTION: 30 days supply | Qty: 600 | Fill #1

## 2019-09-08 MED FILL — COLISTIN (COLISTIMETHATE SODIUM) 150 MG SOLUTION FOR INJECTION: 30 days supply | Qty: 60 | Fill #1

## 2019-09-08 MED FILL — COLISTIN (COLISTIMETHATE SODIUM) 150 MG SOLUTION FOR INJECTION: 30 days supply | Qty: 60 | Fill #1 | Status: AC

## 2019-09-08 MED FILL — LC PLUS MISC: 30 days supply | Qty: 2 | Fill #1

## 2019-09-08 MED FILL — WATER FOR INJECTION, STERILE INJECTION SOLUTION: 30 days supply | Qty: 600 | Fill #1 | Status: AC

## 2019-09-08 MED FILL — SODIUM CHLORIDE 10 % FOR NEBULIZATION: RESPIRATORY_TRACT | 30 days supply | Qty: 900 | Fill #1

## 2019-09-08 MED FILL — ALCOHOL PREP PADS: 50 days supply | Qty: 100 | Fill #1 | Status: AC

## 2019-09-08 MED FILL — SODIUM CHLORIDE 10 % FOR NEBULIZATION: 30 days supply | Qty: 900 | Fill #1 | Status: AC

## 2019-09-08 MED FILL — BD LUER-LOK SYRINGE 3 ML 20 GAUGE X 1": 30 days supply | Qty: 60 | Fill #0 | Status: AC

## 2019-09-08 MED FILL — LC PLUS MISC: 30 days supply | Qty: 2 | Fill #1 | Status: AC

## 2019-09-19 DIAGNOSIS — J479 Bronchiectasis, uncomplicated: Principal | ICD-10-CM

## 2019-09-19 MED ORDER — ALBUTEROL SULFATE HFA 90 MCG/ACTUATION AEROSOL INHALER
3 refills | 0 days
Start: 2019-09-19 — End: ?

## 2019-09-27 DIAGNOSIS — G478 Other sleep disorders: Principal | ICD-10-CM

## 2019-09-27 DIAGNOSIS — G4719 Other hypersomnia: Principal | ICD-10-CM

## 2019-09-27 NOTE — Unmapped (Signed)
Patient requesting order for sleep study for excessive daytime sleepiness.  Last sleep study done in  2016 (no OSA).  Per Dr. Garner Nash, order placed in Epic and patient notified via MyChart.

## 2019-09-27 NOTE — Unmapped (Signed)
Call returned to patient to discuss Pulmonary rehabilitation.  States he is ready to start a program in [employer].  Order faxed to [facility] along with office notes and test results.  He will call back if needed.

## 2019-09-29 ENCOUNTER — Encounter (HOSPITAL_COMMUNITY): Payer: Self-pay | Admitting: *Deleted

## 2019-09-29 ENCOUNTER — Telehealth (HOSPITAL_COMMUNITY): Payer: Self-pay

## 2019-09-29 DIAGNOSIS — J479 Bronchiectasis, uncomplicated: Principal | ICD-10-CM

## 2019-09-29 MED ORDER — HYDROCODONE-HOMATROPINE 5 MG-1.5 MG/5 ML ORAL SYRUP
ORAL | 0 refills | 4.00000 days | Status: CP | PRN
Start: 2019-09-29 — End: ?

## 2019-09-29 NOTE — Progress Notes (Addendum)
Received referral notification from Dr. Joneen Caraway at [facility] for this pt to participate in pulmonary rehab with the the diagnosis of bronchiectasis. Pt referred back in march however at that time the pulmonary rehab program was on hold due to Covid.  Clinical review of pt follow up appt on 06/06/19  Pulmonary office note.  Pt with Covid Risk Score - 3.  Sent for MD signature Pulmonary rehab MD referral form to sign. Pt can  appropriate for scheduling for Pulmonary rehab.  Will forward to support staff for verification of insurance eligibility/benefits and pulmonary rehab staff for scheduling once signed MD referral form is received back with pt consent. Cherre Huger, BSN Cardiac and Training and development officer

## 2019-09-29 NOTE — Telephone Encounter (Signed)
Pt insurance is active and benefits verified through [place] 0, DED $600/$600 met, out of pocket $2,500/$647.99 met, co-insurance 20%. no pre-authorization required. Passport, [place] 09/29/2019_0 :52pm, REF# [id number]

## 2019-09-29 NOTE — Unmapped (Signed)
Patient requesting refill via MyChart:    Afternoon. Can you send a refill for my Hydrocodone cough syrup to [facility] on [address] in [place] [address] please? Thank you!       Last visit: 06/06/2019  Next visit: 10/27/2019  RX last filled per Epic: 01/10/2019    RX routed to Dr. Garner Nash for review    Requested Prescriptions     Pending Prescriptions Disp Refills   ??? HYDROcodone-homatropine (HYCODAN) 5-1.5 mg/5 mL syrup 120 mL 0     Sig: Take 5 mL by mouth every four (4) hours as needed for cough.

## 2019-10-02 NOTE — Telephone Encounter (Signed)
Pt returned PR phone call, adv him we are waiting on ppw from his dr office. Explained scheduling process and went over insurance, patient verbalized understanding.

## 2019-10-05 ENCOUNTER — Other Ambulatory Visit: Payer: Self-pay

## 2019-10-05 ENCOUNTER — Emergency Department (HOSPITAL_COMMUNITY): Payer: 59

## 2019-10-05 ENCOUNTER — Inpatient Hospital Stay (HOSPITAL_COMMUNITY)
Admission: EM | Admit: 2019-10-05 | Discharge: 2019-10-08 | DRG: 191 | Disposition: A | Discharge status: Home / Self Care | Payer: 59 | Attending: Internal Medicine | Admitting: Internal Medicine

## 2019-10-05 ENCOUNTER — Encounter (HOSPITAL_COMMUNITY): Payer: Self-pay | Admitting: Pediatrics

## 2019-10-05 DIAGNOSIS — Z20822 Contact with and (suspected) exposure to covid-19: Secondary | ICD-10-CM | POA: Diagnosis present

## 2019-10-05 DIAGNOSIS — Z902 Acquired absence of lung [part of]: Secondary | ICD-10-CM

## 2019-10-05 DIAGNOSIS — Z8042 Family history of malignant neoplasm of prostate: Secondary | ICD-10-CM

## 2019-10-05 DIAGNOSIS — Z8709 Personal history of other diseases of the respiratory system: Secondary | ICD-10-CM

## 2019-10-05 DIAGNOSIS — K219 Gastro-esophageal reflux disease without esophagitis: Secondary | ICD-10-CM | POA: Diagnosis present

## 2019-10-05 DIAGNOSIS — J471 Bronchiectasis with (acute) exacerbation: Secondary | ICD-10-CM | POA: Diagnosis not present

## 2019-10-05 DIAGNOSIS — Z23 Encounter for immunization: Secondary | ICD-10-CM

## 2019-10-05 DIAGNOSIS — Z820 Family history of epilepsy and other diseases of the nervous system: Secondary | ICD-10-CM

## 2019-10-05 DIAGNOSIS — N179 Acute kidney failure, unspecified: Secondary | ICD-10-CM | POA: Diagnosis present

## 2019-10-05 DIAGNOSIS — Z833 Family history of diabetes mellitus: Secondary | ICD-10-CM

## 2019-10-05 DIAGNOSIS — R0902 Hypoxemia: Secondary | ICD-10-CM | POA: Diagnosis present

## 2019-10-05 DIAGNOSIS — N289 Disorder of kidney and ureter, unspecified: Secondary | ICD-10-CM

## 2019-10-05 DIAGNOSIS — Z8249 Family history of ischemic heart disease and other diseases of the circulatory system: Secondary | ICD-10-CM

## 2019-10-05 DIAGNOSIS — Z9079 Acquired absence of other genital organ(s): Secondary | ICD-10-CM

## 2019-10-05 DIAGNOSIS — I10 Essential (primary) hypertension: Secondary | ICD-10-CM | POA: Diagnosis present

## 2019-10-05 DIAGNOSIS — J988 Other specified respiratory disorders: Secondary | ICD-10-CM

## 2019-10-05 DIAGNOSIS — Z8546 Personal history of malignant neoplasm of prostate: Secondary | ICD-10-CM

## 2019-10-05 DIAGNOSIS — Z823 Family history of stroke: Secondary | ICD-10-CM

## 2019-10-05 DIAGNOSIS — M1990 Unspecified osteoarthritis, unspecified site: Secondary | ICD-10-CM | POA: Diagnosis present

## 2019-10-05 LAB — COMPREHENSIVE METABOLIC PANEL
ALT: 19 U/L (ref 0–44)
AST: 36 U/L (ref 15–41)
Albumin: 3.7 g/dL (ref 3.5–5.0)
Alkaline Phosphatase: 83 U/L (ref 38–126)
Anion gap: 12 (ref 5–15)
BUN: 34 mg/dL — ABNORMAL HIGH (ref 8–23)
CO2: 21 mmol/L — ABNORMAL LOW (ref 22–32)
Calcium: 9.1 mg/dL (ref 8.9–10.3)
Chloride: 103 mmol/L (ref 98–111)
Creatinine, Ser: 1.61 mg/dL — ABNORMAL HIGH (ref 0.61–1.24)
GFR calc Af Amer: 51 mL/min — ABNORMAL LOW (ref >60)
GFR calc non Af Amer: 44 mL/min — ABNORMAL LOW (ref >60)
Glucose, Bld: 90 mg/dL (ref 70–99)
Potassium: 4.4 mmol/L (ref 3.5–5.1)
Sodium: 136 mmol/L (ref 135–145)
Total Bilirubin: 0.9 mg/dL (ref 0.3–1.2)
Total Protein: 7.5 g/dL (ref 6.5–8.1)

## 2019-10-05 LAB — CBC WITH DIFFERENTIAL/PLATELET
Abs Immature Granulocytes: 0.07 10*3/uL (ref 0.00–0.07)
Basophils Absolute: 0.1 10*3/uL (ref 0.0–0.1)
Basophils Relative: 0 %
Eosinophils Absolute: 0.2 10*3/uL (ref 0.0–0.5)
Eosinophils Relative: 1 %
HCT: 45.3 % (ref 39.0–52.0)
Hemoglobin: 15 g/dL (ref 13.0–17.0)
Immature Granulocytes: 0 %
Lymphocytes Relative: 4 %
Lymphs Abs: 0.7 10*3/uL (ref 0.7–4.0)
MCH: 30.9 pg (ref 26.0–34.0)
MCHC: 33.1 g/dL (ref 30.0–36.0)
MCV: 93.2 fL (ref 80.0–100.0)
Monocytes Absolute: 1.1 10*3/uL — ABNORMAL HIGH (ref 0.1–1.0)
Monocytes Relative: 7 %
Neutro Abs: 14.4 10*3/uL — ABNORMAL HIGH (ref 1.7–7.7)
Neutrophils Relative %: 88 %
Platelets: 189 10*3/uL (ref 150–400)
RBC: 4.86 MIL/uL (ref 4.22–5.81)
RDW: 12.1 % (ref 11.5–15.5)
WBC: 16.5 10*3/uL — ABNORMAL HIGH (ref 4.0–10.5)
nRBC: 0 % (ref 0.0–0.2)

## 2019-10-05 MED ORDER — ALBUTEROL SULFATE (2.5 MG/3ML) 0.083% IN NEBU
5.0000 mg | INHALATION_SOLUTION | Freq: Once | RESPIRATORY_TRACT | Status: AC
  Administered 2019-10-05: 5 mg via RESPIRATORY_TRACT
  Filled 2019-10-05: qty 6

## 2019-10-05 NOTE — ED Triage Notes (Signed)
Patient reported has hx of right lower and middle lobe lung removal in 2015 and last Tuesday he might have been exposed to Covid as he's been having low oxygen and difficulty breathing. Reported he received 2 covid vaccine already.

## 2019-10-05 NOTE — ED Provider Notes (Signed)
[facility] EMERGENCY DEPARTMENT Provider Note   CSN: [id number] Arrival date & time: 10/05/19  1517     History Chief Complaint  Patient presents with  . Low Oxygen    [name] is a 64 y.o. male.  Patient with complicated medical history presents with SOB and cough that started yesterday and is worse today. He has a fever at home to 99.0, reporting his 'normal' to be 96. He reports chest pain with cough. History of right middle and lower lobectomy (2015), multiple infections since requiring PICC line placement for treatment. Pulmonology care through [facility] Olena Heckle). He reports he was exposed to COVID+ person several days ago. He is fully vaccinated.   The history is provided by the patient. No language interpreter was used.       Past Medical History:  Diagnosis Date  . Bronchiectasis ([place])   . Bronchiolitis   . Dizziness and giddiness 07/26/2012   none recent  . DJD (degenerative joint disease)    back and neck  . Dyspnea    with walking long distances  . Gastroesophageal reflux disease   . History of kidney stones yrs ago   passed on own  . HTN (hypertension)   . OSA (obstructive sleep apnea)    none since 30 lb weight loss  . Pneumonia last time 2017  . Pneumonia with lung abscess ([place])   . PONV (postoperative nausea and vomiting)    likes scopolamine patch, trouble turning neck to left  . Prostate cancer ([place]) dx Aug 12, 2017  . Tear of left rotator cuff    healed on own    Patient Active Problem List   Diagnosis Date Noted  . Prostate cancer ([place]) 12/08/2017  . Perirectal abscess 10/14/2017  . Sepsis ([place]) 10/14/2017  . Malignant neoplasm of prostate ([place]) 09/09/2017  . Right-sided chest wall pain 06/02/2013  . Hypersomnia with sleep apnea, unspecified 02/20/2013  . Pneumonia due to Pseudomonas ([place]) 02/20/2013  . Dizziness and giddiness 07/26/2012  . Memory deficit 07/26/2012  . Pneumonia with lung abscess ([place])   . DJD  (degenerative joint disease)   . HTN (hypertension)   . Lung abscess ([place]) 11/06/2010  . Bronchiectasis ([place]) 11/06/2010    Past Surgical History:  Procedure Laterality Date  . APPENDECTOMY  1969  . BACK SURGERY     multiple, DJD, cervical fusion, plate removed  . CARPAL TUNNEL RELEASE Right 1999  . COLONOSCOPY  12/1980   bleeding from penis and anus after beach trip; colonoscopy and removed urinary stricture at same time  . KNEE ARTHROSCOPY     left  . LUNG SURGERY  2012,    removed right middle lobe,due to birth defect, removed half of right bottom lobe due to abcess  . LUNG SURGERY  07/2013   removed bottom right middle and lower lobe  . LYMPHADENECTOMY Bilateral 12/08/2017   Procedure: LYMPHADENECTOMY;  Surgeon: Alexis Frock, MD;  Location: [facility];  Service: Urology;  Laterality: Bilateral;  . NECK SURGERY  6720,9470, 2002  . OTHER SURGICAL HISTORY  2016   removed 4" of lower right rib  . OTHER SURGICAL HISTORY     has had 8 PICC lines placed over the course of several years.  . Right video-assisted thoracoscopic surgery,  12/05/2010  . ROBOT ASSISTED LAPAROSCOPIC RADICAL PROSTATECTOMY N/A 12/08/2017   Procedure: XI ROBOTIC ASSISTED LAPAROSCOPIC RADICAL PROSTATECTOMY;  Surgeon: Alexis Frock, MD;  Location: [facility];  Service: Urology;  Laterality: N/A;  .  TONSILLECTOMY  1965  . VIDEO BRONCHOSCOPY     Dr Arlyce Dice       Family History  Problem Relation Age of Onset  . Stroke Mother   . Alzheimer's disease Mother   . Transient ischemic attack Mother   . Chorea Mother   . Liver disease Father   . Diabetes Father   . Congestive Heart Failure Father   . Other Other        sibling with possible right middle lobe issues  . Ataxia Brother   . Ataxia Maternal Aunt   . Prostate cancer Maternal Grandfather   . Prostate cancer Cousin   . Prostate cancer Cousin   . Prostate cancer Cousin     Social History   Tobacco Use  . Smoking status: Never Smoker  . Smokeless  tobacco: Never Used  Vaping Use  . Vaping Use: Never used  Substance Use Topics  . Alcohol use: No    Comment: quit in 1994  . Drug use: Never    Home Medications Prior to Admission medications   Medication Sig Start Date End Date Taking? Authorizing Provider  albuterol (PROVENTIL HFA;VENTOLIN HFA) 108 (90 Base) MCG/ACT inhaler Inhale 2 puffs into the lungs every 4 (four) hours as needed for wheezing or shortness of breath.     [provider]  albuterol (PROVENTIL) (2.5 MG/3ML) 0.083% nebulizer solution Inhale 2.5 mg into the lungs 4 (four) times daily.     [provider]  azithromycin (ZITHROMAX) 250 MG tablet Take 250 mg by mouth daily.    [provider]  budesonide-formoterol (SYMBICORT) 160-4.5 MCG/ACT inhaler Inhale 2 puffs into the lungs 2 (two) times daily.    [provider]  cephALEXin (KEFLEX) 500 MG capsule Take 1 capsule (500 mg total) by mouth 2 (two) times daily. X 3 days. Begin day before next Urology appointment. 12/09/17   Alexis Frock, MD  Colistimethate Sodium POWD Take 2 mLs by nebulization 2 (two) times daily. Use only every other month.    [provider]  doxylamine, Sleep, (UNISOM) 25 MG tablet Take 25 mg by mouth at bedtime.    [provider]  fluticasone (FLONASE) 50 MCG/ACT nasal spray Place 2 sprays into both nostrils daily.    [provider]  HYDROcodone-acetaminophen (NORCO) 7.5-325 MG tablet Take 1 tablet by mouth every 6 (six) hours as needed for moderate pain. 12/08/17   Debbrah Alar, PA-C  HYDROcodone-homatropine (HYCODAN) 5-1.5 MG/5ML syrup Take 5 mLs by mouth every 6 (six) hours as needed for cough. 04/06/12   Tanda Rockers, MD  NON FORMULARY Use Acapella twice daily for airway clearance. Dx. 494.0. Mucous clearing device. 11/18/12   [provider]  omeprazole (PRILOSEC) 20 MG capsule Take 20 mg by mouth 2 (two) times daily.     [provider]  polyethylene glycol  (MIRALAX / GLYCOLAX) packet Take 17 g by mouth daily.     [provider]  Respiratory Therapy Supplies (FLUTTER) DEVI Use several times daily as needed for congestion/thick mucus    [provider]  Sodium Chloride, Inhalant, 7 % NEBU Take 4 mLs by nebulization 4 (four) times daily.     [provider]  tobramycin, PF, ([name]) 300 MG/5ML nebulizer solution Take 300 mg by nebulization 2 (two) times daily. Use only every other month    [provider]  Water For Irrigation, Sterile (STERILE WATER FOR IRRIGATION) 4 mLs daily. Mix 13ml with colistimethate powder to use for nebulization.  [provider]  Wheat Dextrin (BENEFIBER PO) Take 2 Doses by mouth daily.     [provider]    Allergies    Gentamicin, Morphine and related, and Tobramycin  Review of Systems   Review of Systems  Constitutional: Positive for chills, fatigue and fever.  HENT: Negative.  Negative for congestion.   Respiratory: Positive for cough and shortness of breath.   Cardiovascular: Positive for chest pain (with coughing).  Gastrointestinal: Negative.  Negative for abdominal pain.  Musculoskeletal: Negative.   Skin: Negative.   Neurological: Positive for weakness.    Physical Exam Updated Vital Signs BP (!) 122/59 (BP Location: Left Arm)   Pulse (!) 128   Temp 98.2 F (36.8 C) (Oral)   Resp 18   Ht 5\' 8"  (1.727 m)   Wt 77.1 kg   SpO2 100%   BMI 25.85 kg/m   Physical Exam Vitals and nursing note reviewed.  Constitutional:      General: He is not in acute distress.    Appearance: He is ill-appearing. He is not toxic-appearing or diaphoretic.  HENT:     Head: Normocephalic.  Cardiovascular:     Rate and Rhythm: Regular rhythm. Tachycardia present.     Heart sounds: No murmur heard.   Pulmonary:     Breath sounds: No wheezing, rhonchi or rales.     Comments: Absent breath sounds to right mid and lower areas c/w previous lobectomies.  Chest:      Chest wall: Tenderness present.  Abdominal:     Palpations: Abdomen is soft.     Tenderness: There is no abdominal tenderness.  Musculoskeletal:        General: Normal range of motion.     Cervical back: Normal range of motion and neck supple.  Skin:    General: Skin is warm and dry.  Neurological:     Mental Status: He is alert and oriented to person, place, and time.     ED Results / Procedures / Treatments   Labs (all labs ordered are listed, but only abnormal results are displayed) Labs Reviewed  CBC WITH DIFFERENTIAL/PLATELET - Abnormal; Notable for the following components:      Result Value   WBC 16.5 (*)    Neutro Abs 14.4 (*)    Monocytes Absolute 1.1 (*)    All other components within normal limits  COMPREHENSIVE METABOLIC PANEL - Abnormal; Notable for the following components:   CO2 21 (*)    BUN 34 (*)    Creatinine, Ser 1.61 (*)    GFR calc non Af Amer 44 (*)    GFR calc Af Amer 51 (*)    All other components within normal limits   Results for orders placed or performed during the hospital encounter of 10/05/19  CBC with Differential  Result Value Ref Range   WBC 16.5 (H) 4.0 - 10.5 K/uL   RBC 4.86 4.22 - 5.81 MIL/uL   Hemoglobin 15.0 13.0 - 17.0 g/dL   HCT 45.3 39 - 52 %   MCV 93.2 80.0 - 100.0 fL   MCH 30.9 26.0 - 34.0 pg   MCHC 33.1 30.0 - 36.0 g/dL   RDW 12.1 11.5 - 15.5 %   Platelets 189 150 - 400 K/uL   nRBC 0.0 0.0 - 0.2 %   Neutrophils Relative % 88 %   Neutro Abs 14.4 (H) 1.7 - 7.7 K/uL   Lymphocytes Relative 4 %   Lymphs Abs 0.7 0.7 - 4.0 K/uL  Monocytes Relative 7 %   Monocytes Absolute 1.1 (H) 0 - 1 K/uL   Eosinophils Relative 1 %   Eosinophils Absolute 0.2 0 - 0 K/uL   Basophils Relative 0 %   Basophils Absolute 0.1 0 - 0 K/uL   Immature Granulocytes 0 %   Abs Immature Granulocytes 0.07 0.00 - 0.07 K/uL  Comprehensive metabolic panel  Result Value Ref Range   Sodium 136 135 - 145 mmol/L   Potassium 4.4 3.5 - 5.1 mmol/L   Chloride  103 98 - 111 mmol/L   CO2 21 (L) 22 - 32 mmol/L   Glucose, Bld 90 70 - 99 mg/dL   BUN 34 (H) 8 - 23 mg/dL   Creatinine, Ser 1.61 (H) 0.61 - 1.24 mg/dL   Calcium 9.1 8.9 - 10.3 mg/dL   Total Protein 7.5 6.5 - 8.1 g/dL   Albumin 3.7 3.5 - 5.0 g/dL   AST 36 15 - 41 U/L   ALT 19 0 - 44 U/L   Alkaline Phosphatase 83 38 - 126 U/L   Total Bilirubin 0.9 0.3 - 1.2 mg/dL   GFR calc non Af Amer 44 (L) >60 mL/min   GFR calc Af Amer 51 (L) >60 mL/min   Anion gap 12 5 - 15    EKG None  Radiology DG Chest 2 View  Result Date: 10/05/2019 CLINICAL DATA:  Hypoxemia, history of partial right pneumonectomy EXAM: CHEST - 2 VIEW COMPARISON:  10/13/2017 FINDINGS: Frontal and lateral views of the chest demonstrate a stable cardiac silhouette. There are stable postsurgical changes in the right hemithorax from partial right pneumonectomy, with extensive pleural thickening. There is prominent bilateral parenchymal lung scarring and fibrosis, without significant change since prior exam. No superimposed airspace disease, effusion, or pneumothorax. IMPRESSION: 1. Stable postsurgical changes from partial right pneumonectomy. 2. Stable bilateral parenchymal scarring and fibrosis. No acute airspace disease. Electronically Signed   By: Randa Ngo M.D.   On: 10/05/2019 16:29    Procedures Procedures (including critical care time)  Medications Ordered in ED Medications - No data to display  ED Course  I have reviewed the triage vital signs and the nursing notes.  Pertinent labs & imaging results that were available during my care of the patient were reviewed by me and considered in my medical decision making (see chart for details).    MDM Rules/Calculators/A&P                          Patient to ED with productive cough, SOB, hypoxia beginning 2-3 days ago and worsening. History of severe bronchiectasis resulting in right mid- and lower lobectomy in 2015, multiple/recurrent infections since, current  symptoms similar to previous infections. He expresses concern over COVID exposure this past week.   Patient is found to be hypoxic in ED, O2 saturations 90-91% on 2 L by [place]. Not on home O2. No fever in ED. He does have leukocytosis to 16.5, and a mild AKI with cr 1.61. Viral panel, including COVID, negative.   He receives his pulmonary care at [facility], Dr. Olena Heckle. His presentation/condition was discussed with [facility], Dr. Radene Knee, who recommended Zosyn for past sputum cultures positive for pseudomonas. Blood and sputum cultures collected and are pending.   [facility] advises they have no beds available and cannot accept the patient in transfer. Patient updated and is willing to be admitted to [facility].    Final Clinical Impression(s) / ED Diagnoses Final diagnoses:  None  1. Hypoxia 2. Respiratory infection 3. H/o bronchiectasis  Rx / DC Orders ED Discharge Orders    None       Dennie Bible 10/06/19 0322    Mesner, Corene Cornea, MD 10/06/19 [phone]

## 2019-10-06 ENCOUNTER — Encounter (HOSPITAL_COMMUNITY): Payer: Self-pay | Admitting: Internal Medicine

## 2019-10-06 ENCOUNTER — Other Ambulatory Visit: Payer: Self-pay

## 2019-10-06 DIAGNOSIS — N289 Disorder of kidney and ureter, unspecified: Secondary | ICD-10-CM | POA: Diagnosis not present

## 2019-10-06 DIAGNOSIS — M1990 Unspecified osteoarthritis, unspecified site: Secondary | ICD-10-CM | POA: Diagnosis present

## 2019-10-06 DIAGNOSIS — Z833 Family history of diabetes mellitus: Secondary | ICD-10-CM | POA: Diagnosis not present

## 2019-10-06 DIAGNOSIS — Z902 Acquired absence of lung [part of]: Secondary | ICD-10-CM | POA: Diagnosis not present

## 2019-10-06 DIAGNOSIS — Z8546 Personal history of malignant neoplasm of prostate: Secondary | ICD-10-CM | POA: Diagnosis not present

## 2019-10-06 DIAGNOSIS — Z23 Encounter for immunization: Secondary | ICD-10-CM | POA: Diagnosis not present

## 2019-10-06 DIAGNOSIS — J471 Bronchiectasis with (acute) exacerbation: Secondary | ICD-10-CM | POA: Diagnosis present

## 2019-10-06 DIAGNOSIS — N179 Acute kidney failure, unspecified: Secondary | ICD-10-CM | POA: Diagnosis present

## 2019-10-06 DIAGNOSIS — K219 Gastro-esophageal reflux disease without esophagitis: Secondary | ICD-10-CM | POA: Diagnosis present

## 2019-10-06 DIAGNOSIS — Z8042 Family history of malignant neoplasm of prostate: Secondary | ICD-10-CM | POA: Diagnosis not present

## 2019-10-06 DIAGNOSIS — I10 Essential (primary) hypertension: Secondary | ICD-10-CM | POA: Diagnosis present

## 2019-10-06 DIAGNOSIS — J988 Other specified respiratory disorders: Secondary | ICD-10-CM | POA: Diagnosis present

## 2019-10-06 DIAGNOSIS — Z820 Family history of epilepsy and other diseases of the nervous system: Secondary | ICD-10-CM | POA: Diagnosis not present

## 2019-10-06 DIAGNOSIS — Z9079 Acquired absence of other genital organ(s): Secondary | ICD-10-CM | POA: Diagnosis not present

## 2019-10-06 DIAGNOSIS — Z20822 Contact with and (suspected) exposure to covid-19: Secondary | ICD-10-CM | POA: Diagnosis present

## 2019-10-06 DIAGNOSIS — Z8249 Family history of ischemic heart disease and other diseases of the circulatory system: Secondary | ICD-10-CM | POA: Diagnosis not present

## 2019-10-06 DIAGNOSIS — Z823 Family history of stroke: Secondary | ICD-10-CM | POA: Diagnosis not present

## 2019-10-06 DIAGNOSIS — R0902 Hypoxemia: Secondary | ICD-10-CM | POA: Diagnosis present

## 2019-10-06 LAB — URINALYSIS, ROUTINE W REFLEX MICROSCOPIC
Bilirubin Urine: NEGATIVE
Glucose, UA: NEGATIVE mg/dL
Hgb urine dipstick: NEGATIVE
Ketones, ur: NEGATIVE mg/dL
Leukocytes,Ua: NEGATIVE
Nitrite: NEGATIVE
Protein, ur: NEGATIVE mg/dL
Specific Gravity, Urine: 1.028 (ref 1.005–1.030)
pH: 5 (ref 5.0–8.0)

## 2019-10-06 LAB — BASIC METABOLIC PANEL
Anion gap: 10 (ref 5–15)
BUN: 32 mg/dL — ABNORMAL HIGH (ref 8–23)
CO2: 22 mmol/L (ref 22–32)
Calcium: 9 mg/dL (ref 8.9–10.3)
Chloride: 106 mmol/L (ref 98–111)
Creatinine, Ser: 1.49 mg/dL — ABNORMAL HIGH (ref 0.61–1.24)
GFR calc Af Amer: 56 mL/min — ABNORMAL LOW (ref >60)
GFR calc non Af Amer: 49 mL/min — ABNORMAL LOW (ref >60)
Glucose, Bld: 98 mg/dL (ref 70–99)
Potassium: 3.8 mmol/L (ref 3.5–5.1)
Sodium: 138 mmol/L (ref 135–145)

## 2019-10-06 LAB — EXPECTORATED SPUTUM ASSESSMENT W GRAM STAIN, RFLX TO RESP C

## 2019-10-06 LAB — HIV ANTIBODY (ROUTINE TESTING W REFLEX): HIV Screen 4th Generation wRfx: NONREACTIVE

## 2019-10-06 LAB — SARS CORONAVIRUS 2 BY RT PCR (HOSPITAL ORDER, PERFORMED IN ~~LOC~~ HOSPITAL LAB): SARS Coronavirus 2: NEGATIVE

## 2019-10-06 LAB — CBC
HCT: 44.9 % (ref 39.0–52.0)
Hemoglobin: 14.6 g/dL (ref 13.0–17.0)
MCH: 31.3 pg (ref 26.0–34.0)
MCHC: 32.5 g/dL (ref 30.0–36.0)
MCV: 96.1 fL (ref 80.0–100.0)
Platelets: 152 10*3/uL (ref 150–400)
RBC: 4.67 MIL/uL (ref 4.22–5.81)
RDW: 12.3 % (ref 11.5–15.5)
WBC: 9.6 10*3/uL (ref 4.0–10.5)
nRBC: 0 % (ref 0.0–0.2)

## 2019-10-06 LAB — RESPIRATORY PANEL BY RT PCR (FLU A&B, COVID)
Influenza A by PCR: NEGATIVE
Influenza B by PCR: NEGATIVE
SARS Coronavirus 2 by RT PCR: NEGATIVE

## 2019-10-06 LAB — SODIUM, URINE, RANDOM: Sodium, Ur: 31 mmol/L

## 2019-10-06 LAB — CREATININE, URINE, RANDOM: Creatinine, Urine: 216.36 mg/dL

## 2019-10-06 MED ORDER — DORNASE ALFA 2.5 MG/2.5ML IN SOLN
2.5000 mg | Freq: Every day | RESPIRATORY_TRACT | Status: DC
  Administered 2019-10-06 – 2019-10-07 (×2): 2.5 mg via RESPIRATORY_TRACT
  Filled 2019-10-06 (×3): qty 2.5

## 2019-10-06 MED ORDER — TOBRAMYCIN 300 MG/5ML IN NEBU: 300.0000 mg | INHALATION_SOLUTION | Freq: Two times a day (BID) | RESPIRATORY_TRACT | Status: DC

## 2019-10-06 MED ORDER — ALBUTEROL SULFATE (2.5 MG/3ML) 0.083% IN NEBU
2.5000 mg | INHALATION_SOLUTION | Freq: Four times a day (QID) | RESPIRATORY_TRACT | Status: DC
  Administered 2019-10-06 – 2019-10-08 (×7): 2.5 mg via RESPIRATORY_TRACT
  Filled 2019-10-06 (×7): qty 3

## 2019-10-06 MED ORDER — SODIUM CHLORIDE 0.9% FLUSH
3.0000 mL | Freq: Two times a day (BID) | INTRAVENOUS | Status: DC
  Administered 2019-10-06 – 2019-10-08 (×4): 3 mL via INTRAVENOUS

## 2019-10-06 MED ORDER — DOXYLAMINE SUCCINATE (SLEEP) 25 MG PO TABS
25.0000 mg | ORAL_TABLET | Freq: Every day | ORAL | Status: DC
  Administered 2019-10-06 – 2019-10-07 (×2): 25 mg via ORAL
  Filled 2019-10-06 (×3): qty 1

## 2019-10-06 MED ORDER — HYDROCODONE-ACETAMINOPHEN 5-325 MG PO TABS: 1.0000 | ORAL_TABLET | Freq: Four times a day (QID) | ORAL | Status: DC | PRN

## 2019-10-06 MED ORDER — FERROUS SULFATE 325 (65 FE) MG PO TABS
325.0000 mg | ORAL_TABLET | Freq: Two times a day (BID) | ORAL | Status: DC
  Administered 2019-10-06 – 2019-10-08 (×4): 325 mg via ORAL
  Filled 2019-10-06 (×4): qty 1

## 2019-10-06 MED ORDER — PANTOPRAZOLE SODIUM 40 MG PO TBEC
40.0000 mg | DELAYED_RELEASE_TABLET | Freq: Every day | ORAL | Status: DC
  Administered 2019-10-06 – 2019-10-08 (×3): 40 mg via ORAL
  Filled 2019-10-06 (×3): qty 1

## 2019-10-06 MED ORDER — PIPERACILLIN-TAZOBACTAM 3.375 G IVPB
3.3750 g | Freq: Three times a day (TID) | INTRAVENOUS | Status: DC
  Administered 2019-10-06 – 2019-10-08 (×7): 3.375 g via INTRAVENOUS
  Filled 2019-10-06 (×7): qty 50

## 2019-10-06 MED ORDER — MOMETASONE FURO-FORMOTEROL FUM 200-5 MCG/ACT IN AERO: 2.0000 | INHALATION_SPRAY | Freq: Two times a day (BID) | RESPIRATORY_TRACT | Status: DC

## 2019-10-06 MED ORDER — SODIUM CHLORIDE 0.9 % IV SOLN: 250.0000 mL | INTRAVENOUS | Status: DC | PRN

## 2019-10-06 MED ORDER — ENOXAPARIN SODIUM 40 MG/0.4ML ~~LOC~~ SOLN
40.0000 mg | SUBCUTANEOUS | Status: DC
  Administered 2019-10-06 – 2019-10-08 (×3): 40 mg via SUBCUTANEOUS
  Filled 2019-10-06 (×3): qty 0.4

## 2019-10-06 MED ORDER — SODIUM CHLORIDE 0.9 % IV SOLN: INTRAVENOUS | Status: AC

## 2019-10-06 MED ORDER — ONDANSETRON HCL 4 MG/2ML IJ SOLN: 4.0000 mg | Freq: Four times a day (QID) | INTRAMUSCULAR | Status: DC | PRN

## 2019-10-06 MED ORDER — VITAMIN D 25 MCG (1000 UNIT) PO TABS
1000.0000 [IU] | ORAL_TABLET | Freq: Every day | ORAL | Status: DC
  Administered 2019-10-06 – 2019-10-08 (×3): 1000 [IU] via ORAL
  Filled 2019-10-06 (×3): qty 1

## 2019-10-06 MED ORDER — PIPERACILLIN-TAZOBACTAM 3.375 G IVPB 30 MIN
3.3750 g | Freq: Once | INTRAVENOUS | Status: AC
  Administered 2019-10-06: 3.375 g via INTRAVENOUS
  Filled 2019-10-06: qty 50

## 2019-10-06 MED ORDER — MOMETASONE FURO-FORMOTEROL FUM 200-5 MCG/ACT IN AERO
2.0000 | INHALATION_SPRAY | Freq: Two times a day (BID) | RESPIRATORY_TRACT | Status: DC
  Administered 2019-10-07 – 2019-10-08 (×3): 2 via RESPIRATORY_TRACT
  Filled 2019-10-06 (×2): qty 8.8

## 2019-10-06 MED ORDER — HYDROCODONE-ACETAMINOPHEN 7.5-325 MG PO TABS
1.0000 | ORAL_TABLET | Freq: Four times a day (QID) | ORAL | Status: DC | PRN
  Administered 2019-10-06 – 2019-10-08 (×7): 1 via ORAL
  Filled 2019-10-06 (×7): qty 1

## 2019-10-06 MED ORDER — ACETAMINOPHEN 650 MG RE SUPP: 650.0000 mg | Freq: Four times a day (QID) | RECTAL | Status: DC | PRN

## 2019-10-06 MED ORDER — RISAQUAD PO CAPS
2.0000 | ORAL_CAPSULE | Freq: Every day | ORAL | Status: DC
  Administered 2019-10-06 – 2019-10-08 (×3): 2 via ORAL
  Filled 2019-10-06 (×3): qty 2

## 2019-10-06 MED ORDER — SENNOSIDES-DOCUSATE SODIUM 8.6-50 MG PO TABS: 1.0000 | ORAL_TABLET | Freq: Every evening | ORAL | Status: DC | PRN

## 2019-10-06 MED ORDER — ONDANSETRON HCL 4 MG PO TABS: 4.0000 mg | ORAL_TABLET | Freq: Four times a day (QID) | ORAL | Status: DC | PRN

## 2019-10-06 MED ORDER — PNEUMOCOCCAL VAC POLYVALENT 25 MCG/0.5ML IJ INJ
0.5000 mL | INJECTION | INTRAMUSCULAR | Status: AC
  Administered 2019-10-07: 0.5 mL via INTRAMUSCULAR
  Filled 2019-10-06: qty 0.5

## 2019-10-06 MED ORDER — ALBUTEROL SULFATE (2.5 MG/3ML) 0.083% IN NEBU: 2.5000 mg | INHALATION_SOLUTION | RESPIRATORY_TRACT | Status: DC | PRN

## 2019-10-06 MED ORDER — TOBRAMYCIN 300 MG/5ML IN NEBU
300.0000 mg | INHALATION_SOLUTION | Freq: Two times a day (BID) | RESPIRATORY_TRACT | Status: DC
  Administered 2019-10-06: 300 mg via RESPIRATORY_TRACT
  Filled 2019-10-06 (×2): qty 5

## 2019-10-06 MED ORDER — SODIUM CHLORIDE 3 % IN NEBU
4.0000 mL | INHALATION_SOLUTION | Freq: Two times a day (BID) | RESPIRATORY_TRACT | Status: DC
  Administered 2019-10-06 (×2): 4 mL via RESPIRATORY_TRACT
  Filled 2019-10-06 (×6): qty 4

## 2019-10-06 MED ORDER — ACETAMINOPHEN 325 MG PO TABS: 650.0000 mg | ORAL_TABLET | Freq: Four times a day (QID) | ORAL | Status: DC | PRN

## 2019-10-06 MED ORDER — FLUTICASONE PROPIONATE 50 MCG/ACT NA SUSP
2.0000 | Freq: Every day | NASAL | Status: DC
  Administered 2019-10-06 – 2019-10-08 (×3): 2 via NASAL
  Filled 2019-10-06: qty 16

## 2019-10-06 MED ORDER — SODIUM CHLORIDE 0.9% FLUSH: 3.0000 mL | INTRAVENOUS | Status: DC | PRN

## 2019-10-06 MED ORDER — MONTELUKAST SODIUM 10 MG PO TABS
10.0000 mg | ORAL_TABLET | Freq: Every day | ORAL | Status: DC
  Administered 2019-10-06 – 2019-10-07 (×2): 10 mg via ORAL
  Filled 2019-10-06 (×2): qty 1

## 2019-10-06 NOTE — H&P (Signed)
History and Physical    [name]:[id number] DOB: [birth year] DOA: 10/05/2019  PCP: Aura Dials, MD   Patient coming from: Home   Chief Complaint: SOB, productive cough   HPI: [name] is a 64 y.o. male with medical history significant for prostate cancer status post resection, hypertension, bronchiectasis with Pseudomonas colonization and history of pneumonectomy, now presenting to the emergency department with progressive shortness of breath and cough.  Patient reports progressive worsening in his chronic dyspnea over the past couple days as well as increased cough which has been productive of purulent sputum.  He has been short of breath even at rest and is oxygen saturation has been low at home.  Patient reports recent chills and reports a temperature of 99.0 F at home.  He reports history of right middle and right lower lobectomy in 2015, was being considered for lung transplant at some point but has more recently been told that it would likely not be possible.  He follows with pulmonology at [facility]. He is fully vaccinated against COVID-19.  He reports history of prostate cancer status post prostatectomy, continues to follow with urology every 6 months, and reports that there has been no evidence for disease recurrence.  ED Course: Upon arrival to the ED, patient is found to be afebrile, saturating low 90s on 2 L/min of supplemental oxygen, and with stable blood pressure.  Chest x-rays negative for acute cardiopulmonary disease.  Chemistry panel is notable for creatinine of 1.61, up from 0.97 in 2019.  CBC features a leukocytosis to 16,500.  ED physician discussed the case with pulmonology at [facility], there were no beds available at their facility, but admission and treatment with Zosyn was recommended.  Patient was treated with albuterol and Zosyn in the emergency department, started on supplemental oxygen, and hospitalist consulted for admission.  Review of Systems:  All other  systems reviewed and apart from HPI, are negative.  Past Medical History:  Diagnosis Date  . Bronchiectasis ([place])   . Bronchiolitis   . Dizziness and giddiness 07/26/2012   none recent  . DJD (degenerative joint disease)    back and neck  . Dyspnea    with walking long distances  . Gastroesophageal reflux disease   . History of kidney stones yrs ago   passed on own  . HTN (hypertension)   . OSA (obstructive sleep apnea)    none since 30 lb weight loss  . Pneumonia last time 2017  . Pneumonia with lung abscess ([place])   . PONV (postoperative nausea and vomiting)    likes scopolamine patch, trouble turning neck to left  . Prostate cancer ([place]) dx Aug 12, 2017  . Tear of left rotator cuff    healed on own    Past Surgical History:  Procedure Laterality Date  . APPENDECTOMY  1969  . BACK SURGERY     multiple, DJD, cervical fusion, plate removed  . CARPAL TUNNEL RELEASE Right 1999  . COLONOSCOPY  12/1980   bleeding from penis and anus after beach trip; colonoscopy and removed urinary stricture at same time  . KNEE ARTHROSCOPY     left  . LUNG SURGERY  2012,    removed right middle lobe,due to birth defect, removed half of right bottom lobe due to abcess  . LUNG SURGERY  07/2013   removed bottom right middle and lower lobe  . LYMPHADENECTOMY Bilateral 12/08/2017   Procedure: LYMPHADENECTOMY;  Surgeon: Alexis Frock, MD;  Location: [facility];  Service:  Urology;  Laterality: Bilateral;  . NECK SURGERY  4401,[id number], 2002  . OTHER SURGICAL HISTORY  2016   removed 4" of lower right rib  . OTHER SURGICAL HISTORY     has had 8 PICC lines placed over the course of several years.  . Right video-assisted thoracoscopic surgery,  12/05/2010  . ROBOT ASSISTED LAPAROSCOPIC RADICAL PROSTATECTOMY N/A 12/08/2017   Procedure: XI ROBOTIC ASSISTED LAPAROSCOPIC RADICAL PROSTATECTOMY;  Surgeon: Alexis Frock, MD;  Location: [facility];  Service: Urology;  Laterality: N/A;  . TONSILLECTOMY  1965  .  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 He has never used smokeless tobacco. He reports that he does not drink alcohol and does not use drugs.  Allergies  Allergen Reactions  . Gentamicin Other (See Comments)    BALANCE ISSUES  . Morphine And Related Nausea And Vomiting  . Tobramycin Nausea And Vomiting and Other (See Comments)    Ototoxicity, when administered in IV     Family History  Problem Relation Age of Onset  . Stroke Mother   . Alzheimer's disease Mother   . Transient ischemic attack Mother   . Chorea Mother   . Liver disease Father   . Diabetes Father   . Congestive Heart Failure Father   . Other Other        sibling with possible right middle lobe issues  . Ataxia Brother   . Ataxia Maternal Aunt   . Prostate cancer Maternal Grandfather   . Prostate cancer Cousin   . Prostate cancer Cousin   . Prostate cancer Cousin      Prior to Admission medications   Medication Sig Start Date End Date Taking? Authorizing Provider  albuterol (PROVENTIL HFA;VENTOLIN HFA) 108 (90 Base) MCG/ACT inhaler Inhale 2 puffs into the lungs every 4 (four) hours as needed for wheezing or shortness of breath.     [provider]  albuterol (PROVENTIL) (2.5 MG/3ML) 0.083% nebulizer solution Inhale 2.5 mg into the lungs 4 (four) times daily.     [provider]  azithromycin (ZITHROMAX) 250 MG tablet Take 250 mg by mouth daily.    [provider]  budesonide-formoterol (SYMBICORT) 160-4.5 MCG/ACT inhaler Inhale 2 puffs into the lungs 2 (two) times daily.    [provider]  cephALEXin (KEFLEX) 500 MG capsule Take 1 capsule (500 mg total) by mouth 2 (two) times daily. X 3 days. Begin day before next Urology appointment. 12/09/17   Alexis Frock, MD  Colistimethate Sodium POWD Take 2 mLs by nebulization 2 (two) times daily. Use only every other month.    [provider]  doxylamine, Sleep, (UNISOM) 25 MG tablet Take 25  mg by mouth at bedtime.    [provider]  fluticasone (FLONASE) 50 MCG/ACT nasal spray Place 2 sprays into both nostrils daily.    [provider]  HYDROcodone-acetaminophen (NORCO) 7.5-325 MG tablet Take 1 tablet by mouth every 6 (six) hours as needed for moderate pain. 12/08/17   Debbrah Alar, PA-C  HYDROcodone-homatropine (HYCODAN) 5-1.5 MG/5ML syrup Take 5 mLs by mouth every 6 (six) hours as needed for cough. 04/06/12   Tanda Rockers, MD  NON FORMULARY Use Acapella twice daily for airway clearance. Dx. 494.0. Mucous clearing device. 11/18/12   [provider]  omeprazole (PRILOSEC) 20 MG capsule Take 20 mg by mouth 2 (two) times daily.     [provider]  polyethylene glycol (MIRALAX / GLYCOLAX)  packet Take 17 g by mouth daily.     [provider]  Respiratory Therapy Supplies (FLUTTER) DEVI Use several times daily as needed for congestion/thick mucus    [provider]  Sodium Chloride, Inhalant, 7 % NEBU Take 4 mLs by nebulization 4 (four) times daily.     [provider]  tobramycin, PF, ([name]) 300 MG/5ML nebulizer solution Take 300 mg by nebulization 2 (two) times daily. Use only every other month    [provider]  Water For Irrigation, Sterile (STERILE WATER FOR IRRIGATION) 4 mLs daily. Mix 22ml with colistimethate powder to use for nebulization.    [provider]  Wheat Dextrin (BENEFIBER PO) Take 2 Doses by mouth daily.     [provider]    Physical Exam: Vitals:   10/06/19 0200 10/06/19 0215 10/06/19 0230 10/06/19 0245  BP: 126/77 126/72 125/76 131/67  Pulse: 87 85 90 88  Resp:    18  Temp:      TempSrc:      SpO2: 95% 96% 97% 97%  Weight:      Height:        Constitutional: NAD, calm  Eyes: PERTLA, lids and conjunctivae normal ENMT: Mucous membranes are moist. Posterior pharynx clear of any exudate or lesions.   Neck: normal, supple, no masses, no thyromegaly Respiratory:  no  wheezing, no crackles. No accessory muscle use.  Cardiovascular: S1 & S2 heard, regular rate and rhythm. No extremity edema.   Abdomen: No distension, no tenderness, soft. Bowel sounds active.  Musculoskeletal: no clubbing / cyanosis. No joint deformity upper and lower extremities.   Skin: no significant rashes, lesions, ulcers. Warm, dry, well-perfused. Neurologic: No facial asymmetry. Sensation intact. Moving all extremities.  Psychiatric: Alert and oriented to person, place, and situation. Pleasant and cooperative.    Labs and Imaging on Admission: I have personally reviewed following labs and imaging studies  CBC: Recent Labs  Lab 10/05/19 1655  WBC 16.5*  NEUTROABS 14.4*  HGB 15.0  HCT 45.3  MCV 93.2  PLT 542   Basic Metabolic Panel: Recent Labs  Lab 10/05/19 1655  NA 136  K 4.4  CL 103  CO2 21*  GLUCOSE 90  BUN 34*  CREATININE 1.61*  CALCIUM 9.1   GFR: Estimated Creatinine Clearance: 44.3 mL/min (A) (by C-G formula based on SCr of 1.61 mg/dL (H)). Liver Function Tests: Recent Labs  Lab 10/05/19 1655  AST 36  ALT 19  ALKPHOS 83  BILITOT 0.9  PROT 7.5  ALBUMIN 3.7   No results for input(s): LIPASE, AMYLASE in the last 168 hours. No results for input(s): AMMONIA in the last 168 hours. Coagulation Profile: No results for input(s): INR, PROTIME in the last 168 hours. Cardiac Enzymes: No results for input(s): CKTOTAL, CKMB, CKMBINDEX, TROPONINI in the last 168 hours. BNP (last 3 results) No results for input(s): PROBNP in the last 8760 hours. HbA1C: No results for input(s): HGBA1C in the last 72 hours. CBG: No results for input(s): GLUCAP in the last 168 hours. Lipid Profile: No results for input(s): CHOL, HDL, LDLCALC, TRIG, CHOLHDL, LDLDIRECT in the last 72 hours. Thyroid Function Tests: No results for input(s): TSH, T4TOTAL, FREET4, T3FREE, THYROIDAB in the last 72 hours. Anemia Panel: No results for input(s): VITAMINB12, FOLATE, FERRITIN, TIBC,  IRON, RETICCTPCT in the last 72 hours. Urine analysis:    Component Value Date/Time   COLORURINE YELLOW 10/13/2017 2050   [place] 10/13/2017 2050   LABSPEC 1.024 10/13/2017 2050  PHURINE 5.0 10/13/2017 2050   GLUCOSEU NEGATIVE 10/13/2017 2050   [place] NEGATIVE 10/13/2017 2050   [place] NEGATIVE 10/13/2017 2050   KETONESUR NEGATIVE 10/13/2017 2050   PROTEINUR NEGATIVE 10/13/2017 2050   UROBILINOGEN 0.2 12/03/2010 1212   NITRITE NEGATIVE 10/13/2017 2050   LEUKOCYTESUR NEGATIVE 10/13/2017 2050   Sepsis Labs: @LABRCNTIP (procalcitonin:4,lacticidven:4) ) Recent Results (from the past 240 hour(s))  SARS Coronavirus 2 by RT PCR (hospital order, performed in [facility] hospital lab) Nasopharyngeal Nasopharyngeal Swab     Status: None   Collection Time: 10/05/19 11:23 PM   Specimen: Nasopharyngeal Swab  Result Value Ref Range Status   SARS Coronavirus 2 NEGATIVE NEGATIVE Final    Comment: (NOTE) SARS-CoV-2 target nucleic acids are NOT DETECTED.  The SARS-CoV-2 RNA is generally detectable in upper and lower respiratory specimens during the acute phase of infection. The lowest concentration of SARS-CoV-2 viral copies this assay can detect is 250 copies / mL. A negative result does not preclude SARS-CoV-2 infection and should not be used as the sole basis for treatment or other patient management decisions.  A negative result may occur with improper specimen collection / handling, submission of specimen other than nasopharyngeal swab, presence of viral mutation(s) within the areas targeted by this assay, and inadequate number of viral copies (<250 copies / mL). A negative result must be combined with clinical observations, patient history, and epidemiological information.  Fact Sheet for Patients:   StrictlyIdeas.no  Fact Sheet for Healthcare Providers: BankingDealers.co.za  This test is not yet approved or  cleared by  the [place] FDA and has been authorized for detection and/or diagnosis of SARS-CoV-2 by FDA under an Emergency Use Authorization (EUA).  This EUA will remain in effect (meaning this test can be used) for the duration of the COVID-19 declaration under Section 564(b)(1) of the Act, 21 U.S.C. section 360bbb-3(b)(1), unless the authorization is terminated or revoked sooner.  Performed at [facility], [place] [address], [place], [place] [address]   Respiratory Panel by RT PCR (Flu A&B, Covid) -     Status: None   Collection Time: 10/06/19  1:50 AM  Result Value Ref Range Status   SARS Coronavirus 2 by RT PCR NEGATIVE NEGATIVE Final    Comment: (NOTE) SARS-CoV-2 target nucleic acids are NOT DETECTED.  The SARS-CoV-2 RNA is generally detectable in upper respiratoy specimens during the acute phase of infection. The lowest concentration of SARS-CoV-2 viral copies this assay can detect is 131 copies/mL. A negative result does not preclude SARS-Cov-2 infection and should not be used as the sole basis for treatment or other patient management decisions. A negative result may occur with  improper specimen collection/handling, submission of specimen other than nasopharyngeal swab, presence of viral mutation(s) within the areas targeted by this assay, and inadequate number of viral copies (<131 copies/mL). A negative result must be combined with clinical observations, patient history, and epidemiological information. The expected result is Negative.  Fact Sheet for Patients:  PinkCheek.be  Fact Sheet for Healthcare Providers:  GravelBags.it  This test is no t yet approved or cleared by the [place] FDA and  has been authorized for detection and/or diagnosis of SARS-CoV-2 by FDA under an Emergency Use Authorization (EUA). This EUA will remain  in effect (meaning this test can be used) for the duration of the COVID-19  declaration under Section 564(b)(1) of the Act, 21 U.S.C. section 360bbb-3(b)(1), unless the authorization is terminated or revoked sooner.     Influenza A by  PCR NEGATIVE NEGATIVE Final   Influenza B by PCR NEGATIVE NEGATIVE Final    Comment: (NOTE) The Xpert Xpress SARS-CoV-2/FLU/RSV assay is intended as an aid in  the diagnosis of influenza from Nasopharyngeal swab specimens and  should not be used as a sole basis for treatment. Nasal washings and  aspirates are unacceptable for Xpert Xpress SARS-CoV-2/FLU/RSV  testing.  Fact Sheet for Patients: PinkCheek.be  Fact Sheet for Healthcare Providers: GravelBags.it  This test is not yet approved or cleared by the [place] FDA and  has been authorized for detection and/or diagnosis of SARS-CoV-2 by  FDA under an Emergency Use Authorization (EUA). This EUA will remain  in effect (meaning this test can be used) for the duration of the  Covid-19 declaration under Section 564(b)(1) of the Act, 21  U.S.C. section 360bbb-3(b)(1), unless the authorization is  terminated or revoked. Performed at [facility], [place] [address], [place], [place] [address]      Radiological Exams on Admission: DG Chest 2 View  Result Date: 10/05/2019 CLINICAL DATA:  Hypoxemia, history of partial right pneumonectomy EXAM: CHEST - 2 VIEW COMPARISON:  10/13/2017 FINDINGS: Frontal and lateral views of the chest demonstrate a stable cardiac silhouette. There are stable postsurgical changes in the right hemithorax from partial right pneumonectomy, with extensive pleural thickening. There is prominent bilateral parenchymal lung scarring and fibrosis, without significant change since prior exam. No superimposed airspace disease, effusion, or pneumothorax. IMPRESSION: 1. Stable postsurgical changes from partial right pneumonectomy. 2. Stable bilateral parenchymal scarring and fibrosis. No acute airspace  disease. Electronically Signed   By: Randa Ngo M.D.   On: 10/05/2019 16:29    Assessment/Plan   1. Bronchiectasis with acute exacerbation  - Patient with bronchiectasis presenting with increased SOB and purulent sputum production, found to have new 1-2 Lpm supplemental O2 requirement, and pulmonology recommended treatment with Zosyn  - Culture sputum, continue Zosyn, continue breathing treatments, and continue supplemental O2 as needed      2. Renal insufficiency  - SCr is 1.61 in ED, up from 0.97 in 2019  - Unclear if this is acute, will check UA and urine chemistries, start gentle IVF hydration, and repeat chem panel tomorrow     DVT prophylaxis: Lovenox  Code Status: Full  Family Communication: Discussed with patient  Disposition Plan:  Patient is from: home Anticipated d/c is to: Home  Anticipated d/c date is: Possibly as early as 10/07/19 Patient currently: Has new 1-2 Lpm supplemental O2 requirement  Consults called: None  Admission status: Observation     Vianne Bulls, MD [facility]  10/06/2019, 4:30 AM

## 2019-10-06 NOTE — ED Provider Notes (Signed)
Medical screening examination/treatment/procedure(s) were conducted as a shared visit with non-physician practitioner(s) and myself.  I personally evaluated the patient during the encounter.  History of right upper with lower lobectomy secondary to severe bronchiectasis is a multiple infections in the past and is here with symptoms of the same.  Mildly hypoxic and tachypneic even on my exam with oxygen in place.  Diminished breath sounds on the right.  Patient also states he has had some malodorous sputum. Concerned the patient has recurrent infection will start antibiotics and patient requests admission and transfer to [facility].  Will inquire about bed status is and then admit where appropriate.  CRITICAL CARE Performed by: Merrily Pew Total critical care time: 35 minutes Critical care time was exclusive of separately billable procedures and treating other patients. Critical care was necessary to treat or prevent imminent or life-threatening deterioration. Critical care was time spent personally by me on the following activities: development of treatment plan with patient and/or surrogate as well as nursing, discussions with consultants, evaluation of patient's response to treatment, examination of patient, obtaining history from patient or surrogate, ordering and performing treatments and interventions, ordering and review of laboratory studies, ordering and review of radiographic studies, pulse oximetry and re-evaluation of patient's condition.         Merrily Pew, MD 10/06/19 2337

## 2019-10-06 NOTE — ED Notes (Signed)
Breakfast ordered--[name]

## 2019-10-06 NOTE — Progress Notes (Signed)
PROGRESS NOTE                                                                                                                                                                                                             Patient Demographics:    [name], is a 64 y.o. male, DOB - [birth year], ACZ:[id number]  Admit date - 10/05/2019   Admitting Physician Vianne Bulls, MD  Outpatient Primary MD for the patient is Aura Dials, MD  LOS - 0   Chief Complaint  Patient presents with  . Low Oxygen       Brief Narrative    This is a no charge note as patient has been admitted and seen earlier today by Dr. Myna Hidalgo, chart, imaging and labs were reviewed.   HPI: [name] is a 64 y.o. male with medical history significant for prostate cancer status post resection, hypertension, bronchiectasis with Pseudomonas colonization and history of pneumonectomy, now presenting to the emergency department with progressive shortness of breath and cough.  Patient reports progressive worsening in his chronic dyspnea over the past couple days as well as increased cough which has been productive of purulent sputum.  He has been short of breath even at rest and is oxygen saturation has been low at home.  Patient reports recent chills and reports a temperature of 99.0 F at home.  He reports history of right middle and right lower lobectomy in 2015, was being considered for lung transplant at some point but has more recently been told that it would likely not be possible.  He follows with pulmonology at [facility]. He is fully vaccinated against COVID-19.  He reports history of prostate cancer status post prostatectomy, continues to follow with urology every 6 months, and reports that there has been no evidence for disease recurrence.  ED Course: Upon arrival to the ED, patient is found to be afebrile, saturating low 90s on 2 L/min of supplemental oxygen, and with stable blood  pressure.  Chest x-rays negative for acute cardiopulmonary disease.  Chemistry panel is notable for creatinine of 1.61, up from 0.97 in 2019.  CBC features a leukocytosis to 16,500.  ED physician discussed the case with pulmonology at [facility], there were no beds available at their facility, but admission and treatment with Zosyn was recommended.  Patient was treated with albuterol  and Zosyn in the emergency department, started on supplemental oxygen, and hospitalist consulted for admission.    Subjective:    [name] today still reports significant cough, with a productive green phlegm, still reports dyspnea .   Assessment  & Plan :    Principal Problem:   Bronchiectasis with (acute) exacerbation (HCC) Active Problems:   Renal insufficiency   1. Bronchiectasis with acute exacerbation  - Patient with bronchiectasis presenting with increased SOB and purulent sputum production, found to have new 1-2 Lpm supplemental O2 requirement, and pulmonology recommended treatment with Zosyn  - Culture sputum, continue Zosyn, continue breathing treatments, and continue supplemental O2 as needed     -Patient remains with active cough, dyspneic with minimal activity, but no further hypoxia or oxygen requirement, he was encouraged to use incentive spirometry and flutter valve, will continue with Zosyn and follow on sputum cultures.  2. Renal insufficiency  - SCr is 1.61 in ED, up from 0.97 in 2019  - Is improving with IV hydration, but creatinine remains elevated at 1.4, will continue with current dose of IV normal saline at 75 cc/h, avoid nephrotoxic medications.  COVID-19 Labs  No results for input(s): DDIMER, FERRITIN, LDH, CRP in the last 72 hours.  Lab Results  Component Value Date   SARSCOV2NAA NEGATIVE 10/06/2019   [place] NEGATIVE 10/05/2019     Code Status : Full  Family Communication  : none at bedside  Disposition Plan  :  Status is: Observation  The patient will require care  spanning > 2 midnights and should be moved to inpatient because: IV treatments appropriate due to intensity of illness or inability to take PO  Dispo: The patient is from: Home              Anticipated d/c is to: Home              Anticipated d/c date is: 3 days              Patient currently is not medically stable to d/c.  Patient remains with significant dyspnea, significant cough, with copious green/yellow phlegm, with known history of Pseudomonas colonization, he still needs IV antibiotics, not ready to be changed to oral regimen yet.        Barriers For Discharge :   Consults  :  none  Procedures  : none  DVT Prophylaxis  :  [place] lovenox  Lab Results  Component Value Date   PLT 152 10/06/2019    Antibiotics  :    Anti-infectives (From admission, onward)   Start     Dose/Rate Route Frequency Ordered Stop   10/06/19 1330  tobramycin (PF) ([name]) nebulizer solution 300 mg     Discontinue    Note to Pharmacy: Use only every other month     300 mg Nebulization 2 times daily 10/06/19 1328     10/06/19 0800  tobramycin (PF) ([name]) nebulizer solution 300 mg     Discontinue     300 mg Nebulization 2 times daily 10/06/19 0448     10/06/19 0800  piperacillin-tazobactam (ZOSYN) IVPB 3.375 g     Discontinue     3.375 g 12.5 mL/hr over 240 Minutes Intravenous Every 8 hours 10/06/19 0434     10/06/19 0030  piperacillin-tazobactam (ZOSYN) IVPB 3.375 g        3.375 g 100 mL/hr over 30 Minutes Intravenous  Once 10/06/19 0024 10/06/19 0147        Objective:   Vitals:  10/06/19 0500 10/06/19 0515 10/06/19 1204 10/06/19 1226  BP: 130/74 121/71 127/73 118/70  Pulse: 85 86 79 78  Resp: 18 19    Temp:   98 F (36.7 C) 97.8 F (36.6 C)  TempSrc:   Oral   SpO2: 96% 96% 97% 94%  Weight:      Height:        Wt Readings from Last 3 Encounters:  10/05/19 77.1 kg  12/08/17 84.1 kg  12/02/17 81 kg    No intake or output data in the 24 hours ending 10/06/19 1347   Physical  Exam  Awake Alert, Oriented X 3, No new F.N deficits, Normal affect Symmetrical Chest wall movement, scattered rales, R>L RRR,No Gallops,Rubs or new Murmurs, No Parasternal Heave +ve B.Sounds, Abd Soft, No tenderness, No organomegaly appriciated, No rebound - guarding or rigidity. No Cyanosis, Clubbing or edema, No new Rash or bruise      Data Review:    CBC Recent Labs  Lab 10/05/19 1655 10/06/19 0541  WBC 16.5* 9.6  HGB 15.0 14.6  HCT 45.3 44.9  PLT 189 152  MCV 93.2 96.1  MCH 30.9 31.3  MCHC 33.1 32.5  RDW 12.1 12.3  LYMPHSABS 0.7  --   MONOABS 1.1*  --   EOSABS 0.2  --   BASOSABS 0.1  --     Chemistries  Recent Labs  Lab 10/05/19 1655 10/06/19 0541  NA 136 138  K 4.4 3.8  CL 103 106  CO2 21* 22  GLUCOSE 90 98  BUN 34* 32*  CREATININE 1.61* 1.49*  CALCIUM 9.1 9.0  AST 36  --   ALT 19  --   ALKPHOS 83  --   BILITOT 0.9  --    ------------------------------------------------------------------------------------------------------------------ No results for input(s): CHOL, HDL, LDLCALC, TRIG, CHOLHDL, LDLDIRECT in the last 72 hours.  No results found for: HGBA1C ------------------------------------------------------------------------------------------------------------------ No results for input(s): TSH, T4TOTAL, T3FREE, THYROIDAB in the last 72 hours.  Invalid input(s): FREET3 ------------------------------------------------------------------------------------------------------------------ No results for input(s): VITAMINB12, FOLATE, FERRITIN, TIBC, IRON, RETICCTPCT in the last 72 hours.  Coagulation profile No results for input(s): INR, PROTIME in the last 168 hours.  No results for input(s): DDIMER in the last 72 hours.  Cardiac Enzymes No results for input(s): CKMB, TROPONINI, MYOGLOBIN in the last 168 hours.  Invalid input(s):  CK ------------------------------------------------------------------------------------------------------------------ No results found for: BNP  Inpatient Medications  Scheduled Meds: . albuterol  2.5 mg Inhalation QID  . cholecalciferol  1,000 Units Oral Daily  . dornase alpha  2.5 mg Nebulization Daily  . doxylamine (Sleep)  25 mg Oral QHS  . enoxaparin (LOVENOX) injection  40 mg Subcutaneous Q24H  . ferrous sulfate  325 mg Oral BID WC  . fluticasone  2 spray Each Nare Daily  . mometasone-formoterol  2 puff Inhalation BID  . montelukast  10 mg Oral QHS  . pantoprazole  40 mg Oral Daily  . sodium chloride flush  3 mL Intravenous Q12H  . sodium chloride flush  3 mL Intravenous Q12H  . sodium chloride HYPERTONIC  4 mL Nebulization BID  . tobramycin (PF)  300 mg Nebulization BID  . tobramycin (PF)  300 mg Nebulization BID   Continuous Infusions: . sodium chloride    . sodium chloride 75 mL/hr at 10/06/19 0557  . piperacillin-tazobactam (ZOSYN)  IV 3.375 g (10/06/19 0815)   PRN Meds:.sodium chloride, acetaminophen **OR** acetaminophen, albuterol, HYDROcodone-acetaminophen, ondansetron **OR** ondansetron (ZOFRAN) IV, senna-docusate, sodium chloride flush  Micro Results Recent Results (  from the past 240 hour(s))  SARS Coronavirus 2 by RT PCR (hospital order, performed in [facility] hospital lab) Nasopharyngeal Nasopharyngeal Swab     Status: None   Collection Time: 10/05/19 11:23 PM   Specimen: Nasopharyngeal Swab  Result Value Ref Range Status   SARS Coronavirus 2 NEGATIVE NEGATIVE Final    Comment: (NOTE) SARS-CoV-2 target nucleic acids are NOT DETECTED.  The SARS-CoV-2 RNA is generally detectable in upper and lower respiratory specimens during the acute phase of infection. The lowest concentration of SARS-CoV-2 viral copies this assay can detect is 250 copies / mL. A negative result does not preclude SARS-CoV-2 infection and should not be used as the sole basis for  treatment or other patient management decisions.  A negative result may occur with improper specimen collection / handling, submission of specimen other than nasopharyngeal swab, presence of viral mutation(s) within the areas targeted by this assay, and inadequate number of viral copies (<250 copies / mL). A negative result must be combined with clinical observations, patient history, and epidemiological information.  Fact Sheet for Patients:   StrictlyIdeas.no  Fact Sheet for Healthcare Providers: BankingDealers.co.za  This test is not yet approved or  cleared by the [place] FDA and has been authorized for detection and/or diagnosis of SARS-CoV-2 by FDA under an Emergency Use Authorization (EUA).  This EUA will remain in effect (meaning this test can be used) for the duration of the COVID-19 declaration under Section 564(b)(1) of the Act, 21 U.S.C. section 360bbb-3(b)(1), unless the authorization is terminated or revoked sooner.  Performed at [facility], [place] [address] [place], [place] [address]   Respiratory Panel by RT PCR (Flu A&B, Covid) -     Status: None   Collection Time: 10/06/19  1:50 AM  Result Value Ref Range Status   SARS Coronavirus 2 by RT PCR NEGATIVE NEGATIVE Final    Comment: (NOTE) SARS-CoV-2 target nucleic acids are NOT DETECTED.  The SARS-CoV-2 RNA is generally detectable in upper respiratoy specimens during the acute phase of infection. The lowest concentration of SARS-CoV-2 viral copies this assay can detect is 131 copies/mL. A negative result does not preclude SARS-Cov-2 infection and should not be used as the sole basis for treatment or other patient management decisions. A negative result may occur with  improper specimen collection/handling, submission of specimen other than nasopharyngeal swab, presence of viral mutation(s) within the areas targeted by this assay, and inadequate number of  viral copies (<131 copies/mL). A negative result must be combined with clinical observations, patient history, and epidemiological information. The expected result is Negative.  Fact Sheet for Patients:  PinkCheek.be  Fact Sheet for Healthcare Providers:  GravelBags.it  This test is no t yet approved or cleared by the [place] FDA and  has been authorized for detection and/or diagnosis of SARS-CoV-2 by FDA under an Emergency Use Authorization (EUA). This EUA will remain  in effect (meaning this test can be used) for the duration of the COVID-19 declaration under Section 564(b)(1) of the Act, 21 U.S.C. section 360bbb-3(b)(1), unless the authorization is terminated or revoked sooner.     Influenza A by PCR NEGATIVE NEGATIVE Final   Influenza B by PCR NEGATIVE NEGATIVE Final    Comment: (NOTE) The Xpert Xpress SARS-CoV-2/FLU/RSV assay is intended as an aid in  the diagnosis of influenza from Nasopharyngeal swab specimens and  should not be used as a sole basis for treatment. Nasal washings and  aspirates are unacceptable for Xpert Xpress  SARS-CoV-2/FLU/RSV  testing.  Fact Sheet for Patients: PinkCheek.be  Fact Sheet for Healthcare Providers: GravelBags.it  This test is not yet approved or cleared by the [place] FDA and  has been authorized for detection and/or diagnosis of SARS-CoV-2 by  FDA under an Emergency Use Authorization (EUA). This EUA will remain  in effect (meaning this test can be used) for the duration of the  Covid-19 declaration under Section 564(b)(1) of the Act, 21  U.S.C. section 360bbb-3(b)(1), unless the authorization is  terminated or revoked. Performed at [facility], [place] [address] [place], [place] [address]     Radiology Reports DG Chest 2 View  Result Date: 10/05/2019 CLINICAL DATA:  Hypoxemia, history of partial  right pneumonectomy EXAM: CHEST - 2 VIEW COMPARISON:  10/13/2017 FINDINGS: Frontal and lateral views of the chest demonstrate a stable cardiac silhouette. There are stable postsurgical changes in the right hemithorax from partial right pneumonectomy, with extensive pleural thickening. There is prominent bilateral parenchymal lung scarring and fibrosis, without significant change since prior exam. No superimposed airspace disease, effusion, or pneumothorax. IMPRESSION: 1. Stable postsurgical changes from partial right pneumonectomy. 2. Stable bilateral parenchymal scarring and fibrosis. No acute airspace disease. Electronically Signed   By: Randa Ngo M.D.   On: 10/05/2019 16:29     Phillips Climes M.D on 10/06/2019 at 1:47 PM    [facility] -  Office  [phone]

## 2019-10-06 NOTE — ED Notes (Signed)
Lab unable to complete order for influenza panel by PCR. Lab will reorder as a viral swab in order to obtain results. Lab has appropriate sample. Primary RN Abe People informed

## 2019-10-06 NOTE — Progress Notes (Signed)
Pharmacy Antibiotic Note  [name] is a 64 y.o. male admitted on 10/05/2019 with bronchiectasis exacerbation, pseudomonas colinization.  Pharmacy has been consulted for Zosyn dosing.  Plan: Zosyn 3.375gm IV q8h Will f/u micro data, SCr, and pt's clinical condition  Height: 5\' 8"  (172.7 cm) Weight: 77.1 kg (170 lb) IBW/kg (Calculated) : 68.4  Temp (24hrs), Avg:98.5 F (36.9 C), Min:98.2 F (36.8 C), Max:98.9 F (37.2 C)  Recent Labs  Lab 10/05/19 1655  WBC 16.5*  CREATININE 1.61*    Estimated Creatinine Clearance: 44.3 mL/min (A) (by C-G formula based on SCr of 1.61 mg/dL (H)).    Allergies  Allergen Reactions  . Gentamicin Other (See Comments)    BALANCE ISSUES  . Morphine And Related Nausea And Vomiting  . Tobramycin Nausea And Vomiting and Other (See Comments)    Ototoxicity, when administered in IV     Antimicrobials this admission: 7/16 Zosyn >>   Microbiology results: 7/16 BCx:    Thank you for allowing pharmacy to be a part of this patient's care.  Sherlon Handing, PharmD, BCPS Please see amion for complete clinical pharmacist phone list 10/06/2019 4:32 AM

## 2019-10-06 NOTE — Unmapped (Signed)
Transfer Request Note    Requesting Hospital: [facility]    Requesting Service: ED    Reason for Transfer: bronchiectasis exacerbation    Brief Hospital Course:   Pt w history of bronchiectasis followed by [facility], chronic PsA colonization. Presented to [facility] with productive cough, SOB. Hypoxemic to high 80s in RA, placed on 2L. Additional details in [facility] intake encounter. Spoke with Dr. Nelson Chimes initially who suggested possible admission to [facility]. Although followed at [facility] and patient preference to be here, notable that [facility] does have pulmonary and is capable of caring for him. Given that, and given limited bed capacity here, recommended admission there at [facility].     Plan Upon Arrival:   Unable to Accept: medicine??capacity issues and lack of medical necessity for transfer to a quaternary care facility.

## 2019-10-07 LAB — CBC
HCT: 37 % — ABNORMAL LOW (ref 39.0–52.0)
Hemoglobin: 11.9 g/dL — ABNORMAL LOW (ref 13.0–17.0)
MCH: 30.4 pg (ref 26.0–34.0)
MCHC: 32.2 g/dL (ref 30.0–36.0)
MCV: 94.6 fL (ref 80.0–100.0)
Platelets: 126 10*3/uL — ABNORMAL LOW (ref 150–400)
RBC: 3.91 MIL/uL — ABNORMAL LOW (ref 4.22–5.81)
RDW: 12.2 % (ref 11.5–15.5)
WBC: 6.8 10*3/uL (ref 4.0–10.5)
nRBC: 0 % (ref 0.0–0.2)

## 2019-10-07 LAB — BASIC METABOLIC PANEL
Anion gap: 8 (ref 5–15)
BUN: 31 mg/dL — ABNORMAL HIGH (ref 8–23)
CO2: 25 mmol/L (ref 22–32)
Calcium: 8.5 mg/dL — ABNORMAL LOW (ref 8.9–10.3)
Chloride: 106 mmol/L (ref 98–111)
Creatinine, Ser: 1.55 mg/dL — ABNORMAL HIGH (ref 0.61–1.24)
GFR calc Af Amer: 54 mL/min — ABNORMAL LOW (ref >60)
GFR calc non Af Amer: 46 mL/min — ABNORMAL LOW (ref >60)
Glucose, Bld: 103 mg/dL — ABNORMAL HIGH (ref 70–99)
Potassium: 3.5 mmol/L (ref 3.5–5.1)
Sodium: 139 mmol/L (ref 135–145)

## 2019-10-07 NOTE — Progress Notes (Signed)
Attempted giving breathing treatment x2 but patient is eating. Will attempt again at a later time.

## 2019-10-07 NOTE — Progress Notes (Signed)
PROGRESS NOTE                                                                                                                                                                                                             Patient Demographics:    [name], is a 64 y.o. male, DOB - March 17, 1956, FVC:[id number]  Admit date - 10/05/2019   Admitting Physician Albertine [name], MD  Outpatient Primary MD for the patient is Aura Dials, MD  LOS - 1   Chief Complaint  Patient presents with  . Low Oxygen       Brief Narrative    HPI: [name] is a 64 y.o. male with medical history significant for prostate cancer status post resection, hypertension, bronchiectasis with Pseudomonas colonization and history of pneumonectomy, now presenting to the emergency department with progressive shortness of breath and cough.  Patient reports progressive worsening in his chronic dyspnea over the past couple days as well as increased cough which has been productive of purulent sputum.  He has been short of breath even at rest and is oxygen saturation has been low at home.  Patient reports recent chills and reports a temperature of 99.0 F at home.  He reports history of right middle and right lower lobectomy in 2015, was being considered for lung transplant at some point but has more recently been told that it would likely not be possible.  He follows with pulmonology at [facility]. He is fully vaccinated against COVID-19.  He reports history of prostate cancer status post prostatectomy, continues to follow with urology every 6 months, and reports that there has been no evidence for disease recurrence.  ED Course: Upon arrival to the ED, patient is found to be afebrile, saturating low 90s on 2 L/min of supplemental oxygen, and with stable blood pressure.  Chest x-rays negative for acute cardiopulmonary disease.  Chemistry panel is notable for creatinine of 1.61, up from 0.97 in  2019.  CBC features a leukocytosis to 16,500.  ED physician discussed the case with pulmonology at [facility], there were no beds available at their facility, but admission and treatment with Zosyn was recommended.  Patient was treated with albuterol and Zosyn in the emergency department, started on supplemental oxygen, and hospitalist consulted for admission.    Subjective:    [name] today reports  dyspnea has improved, but still present with activity, not quite back to baseline, as well he still reporting cough, with a productive green phlegm.     Assessment  & Plan :    Principal Problem:   Bronchiectasis with (acute) exacerbation (HCC) Active Problems:   Renal insufficiency   Bronchiectasis with acute exacerbation ([place])   1. Bronchiectasis with acute exacerbation  - Patient with bronchiectasis presenting with increased SOB and purulent sputum production, found to have new 1-2 Lpm supplemental O2 requirement, and pulmonology recommended treatment with Zosyn  - Given known history of Pseudomonas colonization, continue with IV Zosyn for now, he still having some symptoms including dyspnea with activity, cough hyperactive sputum, so we will continue with IV antibiotic regimen, if he continues to improve at 1 point hopefully he can be transitioned to oral quinolones, but overall he will need treatment of total of 14 days.  . -He was encouraged again to use incentive spirometry, flutter valve, which she has been very compliant with . -Follow on the final results of sputum cultures .   2. Renal insufficiency  - SCr is 1.61 in ED, up from 0.97 in 2019  - Overall it is improving with IV hydration .   COVID-19 Labs  No results for input(s): DDIMER, FERRITIN, LDH, CRP in the last 72 hours.  Lab Results  Component Value Date   SARSCOV2NAA NEGATIVE 10/06/2019   [place] NEGATIVE 10/05/2019     Code Status : Full  Family Communication  : none at bedside  Disposition Plan  :    Status is: Observation  The patient will require care spanning > 2 midnights and should be moved to inpatient because: IV treatments appropriate due to intensity of illness or inability to take PO  Dispo: The patient is from: Home              Anticipated d/c is to: Home              Anticipated d/c date is: 2 days              Patient currently is not medically stable to d/c.  Patient remains with significant dyspnea, significant cough, with copious green/yellow phlegm, with known history of Pseudomonas colonization, he still needs IV antibiotics, not ready to be changed to oral regimen yet.        Barriers For Discharge :   Consults  :  none  Procedures  : none  DVT Prophylaxis  :  [place] lovenox  Lab Results  Component Value Date   PLT 126 (L) 10/07/2019    Antibiotics  :    Anti-infectives (From admission, onward)   Start     Dose/Rate Route Frequency Ordered Stop   10/06/19 1330  tobramycin (PF) ([name]) nebulizer solution 300 mg  Status:  Discontinued       Note to Pharmacy: Use only every other month     300 mg Nebulization 2 times daily 10/06/19 1328 10/06/19 1430   10/06/19 0800  tobramycin (PF) ([name]) nebulizer solution 300 mg  Status:  Discontinued        300 mg Nebulization 2 times daily 10/06/19 0448 10/06/19 1432   10/06/19 0800  piperacillin-tazobactam (ZOSYN) IVPB 3.375 g     Discontinue     3.375 g 12.5 mL/hr over 240 Minutes Intravenous Every 8 hours 10/06/19 0434     10/06/19 0030  piperacillin-tazobactam (ZOSYN) IVPB 3.375 g        3.375 g 100 mL/hr  over 30 Minutes Intravenous  Once 10/06/19 0024 10/06/19 0147        Objective:   Vitals:   10/06/19 1928 10/06/19 2056 10/06/19 2059 10/07/19 1033  BP:  (!) 112/52 100/70   Pulse:  93 98   Resp:  18 16   Temp:  98.2 F (36.8 C) 98 F (36.7 C)   TempSrc:   Oral   SpO2: 92%  92% 94%  Weight:      Height:        Wt Readings from Last 3 Encounters:  10/05/19 77.1 kg  12/08/17 84.1 kg  12/02/17  81 kg     Intake/Output Summary (Last 24 hours) at 10/07/2019 1241 Last data filed at 10/07/2019 0409 Gross per 24 hour  Intake 1100 ml  Output --  Net 1100 ml     Physical Exam  Awake Alert, Oriented X 3, No new F.N deficits, Normal affect Symmetrical Chest wall movement, rhonchi and rales, more pronounced in the right lung and bilateral bases RRR,No Gallops,Rubs or new Murmurs, No Parasternal Heave +ve B.Sounds, Abd Soft, No tenderness, No rebound - guarding or rigidity. No Cyanosis, Clubbing or edema, No new Rash or bruise      Data Review:    CBC Recent Labs  Lab 10/05/19 1655 10/06/19 0541 10/07/19 0549  WBC 16.5* 9.6 6.8  HGB 15.0 14.6 11.9*  HCT 45.3 44.9 37.0*  PLT 189 152 126*  MCV 93.2 96.1 94.6  MCH 30.9 31.3 30.4  MCHC 33.1 32.5 32.2  RDW 12.1 12.3 12.2  LYMPHSABS 0.7  --   --   MONOABS 1.1*  --   --   EOSABS 0.2  --   --   BASOSABS 0.1  --   --     Chemistries  Recent Labs  Lab 10/05/19 1655 10/06/19 0541 10/07/19 0549  NA 136 138 139  K 4.4 3.8 3.5  CL 103 106 106  CO2 21* 22 25  GLUCOSE 90 98 103*  BUN 34* 32* 31*  CREATININE 1.61* 1.49* 1.55*  CALCIUM 9.1 9.0 8.5*  AST 36  --   --   ALT 19  --   --   ALKPHOS 83  --   --   BILITOT 0.9  --   --    ------------------------------------------------------------------------------------------------------------------ No results for input(s): CHOL, HDL, LDLCALC, TRIG, CHOLHDL, LDLDIRECT in the last 72 hours.  No results found for: HGBA1C ------------------------------------------------------------------------------------------------------------------ No results for input(s): TSH, T4TOTAL, T3FREE, THYROIDAB in the last 72 hours.  Invalid input(s): FREET3 ------------------------------------------------------------------------------------------------------------------ No results for input(s): VITAMINB12, FOLATE, FERRITIN, TIBC, IRON, RETICCTPCT in the last 72 hours.  Coagulation profile No  results for input(s): INR, PROTIME in the last 168 hours.  No results for input(s): DDIMER in the last 72 hours.  Cardiac Enzymes No results for input(s): CKMB, TROPONINI, MYOGLOBIN in the last 168 hours.  Invalid input(s): CK ------------------------------------------------------------------------------------------------------------------ No results found for: BNP  Inpatient Medications  Scheduled Meds: . acidophilus  2 capsule Oral Daily  . albuterol  2.5 mg Inhalation QID  . cholecalciferol  1,000 Units Oral Daily  . dornase alpha  2.5 mg Nebulization Daily  . doxylamine (Sleep)  25 mg Oral QHS  . enoxaparin (LOVENOX) injection  40 mg Subcutaneous Q24H  . ferrous sulfate  325 mg Oral BID WC  . fluticasone  2 spray Each Nare Daily  . mometasone-formoterol  2 puff Inhalation BID  . montelukast  10 mg Oral QHS  . pantoprazole  40 mg  Oral Daily  . sodium chloride flush  3 mL Intravenous Q12H  . sodium chloride flush  3 mL Intravenous Q12H  . sodium chloride HYPERTONIC  4 mL Nebulization BID   Continuous Infusions: . sodium chloride    . sodium chloride 75 mL/hr at 10/06/19 0557  . piperacillin-tazobactam (ZOSYN)  IV 3.375 g (10/07/19 0611)   PRN Meds:.sodium chloride, acetaminophen **OR** acetaminophen, albuterol, HYDROcodone-acetaminophen, ondansetron **OR** ondansetron (ZOFRAN) IV, senna-docusate, sodium chloride flush  Micro Results Recent Results (from the past 240 hour(s))  SARS Coronavirus 2 by RT PCR (hospital order, performed in [place] hospital lab) Nasopharyngeal Nasopharyngeal Swab     Status: None   Collection Time: 10/05/19 11:23 PM   Specimen: Nasopharyngeal Swab  Result Value Ref Range Status   SARS Coronavirus 2 NEGATIVE NEGATIVE Final    Comment: (NOTE) SARS-CoV-2 target nucleic acids are NOT DETECTED.  The SARS-CoV-2 RNA is generally detectable in upper and lower respiratory specimens during the acute phase of infection. The lowest concentration of  SARS-CoV-2 viral copies this assay can detect is 250 copies / mL. A negative result does not preclude SARS-CoV-2 infection and should not be used as the sole basis for treatment or other patient management decisions.  A negative result may occur with improper specimen collection / handling, submission of specimen other than nasopharyngeal swab, presence of viral mutation(s) within the areas targeted by this assay, and inadequate number of viral copies (<250 copies / mL). A negative result must be combined with clinical observations, patient history, and epidemiological information.  Fact Sheet for Patients:   StrictlyIdeas.no  Fact Sheet for Healthcare Providers: BankingDealers.co.za  This test is not yet approved or  cleared by the [place] FDA and has been authorized for detection and/or diagnosis of SARS-CoV-2 by FDA under an Emergency Use Authorization (EUA).  This EUA will remain in effect (meaning this test can be used) for the duration of the COVID-19 declaration under Section 564(b)(1) of the Act, 21 U.S.C. section 360bbb-3(b)(1), unless the authorization is terminated or revoked sooner.  Performed at [facility], [place] [address], [place], [place] [address]   Respiratory Panel by RT PCR (Flu A&B, Covid) -     Status: None   Collection Time: 10/06/19  1:50 AM  Result Value Ref Range Status   SARS Coronavirus 2 by RT PCR NEGATIVE NEGATIVE Final    Comment: (NOTE) SARS-CoV-2 target nucleic acids are NOT DETECTED.  The SARS-CoV-2 RNA is generally detectable in upper respiratoy specimens during the acute phase of infection. The lowest concentration of SARS-CoV-2 viral copies this assay can detect is 131 copies/mL. A negative result does not preclude SARS-Cov-2 infection and should not be used as the sole basis for treatment or other patient management decisions. A negative result may occur with  improper specimen  collection/handling, submission of specimen other than nasopharyngeal swab, presence of viral mutation(s) within the areas targeted by this assay, and inadequate number of viral copies (<131 copies/mL). A negative result must be combined with clinical observations, patient history, and epidemiological information. The expected result is Negative.  Fact Sheet for Patients:  PinkCheek.be  Fact Sheet for Healthcare Providers:  GravelBags.it  This test is no t yet approved or cleared by the [place] FDA and  has been authorized for detection and/or diagnosis of SARS-CoV-2 by FDA under an Emergency Use Authorization (EUA). This EUA will remain  in effect (meaning this test can be used) for the duration of the COVID-19 declaration under  Section 564(b)(1) of the Act, 21 U.S.C. section 360bbb-3(b)(1), unless the authorization is terminated or revoked sooner.     Influenza A by PCR NEGATIVE NEGATIVE Final   Influenza B by PCR NEGATIVE NEGATIVE Final    Comment: (NOTE) The Xpert Xpress SARS-CoV-2/FLU/RSV assay is intended as an aid in  the diagnosis of influenza from Nasopharyngeal swab specimens and  should not be used as a sole basis for treatment. Nasal washings and  aspirates are unacceptable for Xpert Xpress SARS-CoV-2/FLU/RSV  testing.  Fact Sheet for Patients: PinkCheek.be  Fact Sheet for Healthcare Providers: GravelBags.it  This test is not yet approved or cleared by the [place] FDA and  has been authorized for detection and/or diagnosis of SARS-CoV-2 by  FDA under an Emergency Use Authorization (EUA). This EUA will remain  in effect (meaning this test can be used) for the duration of the  Covid-19 declaration under Section 564(b)(1) of the Act, 21  U.S.C. section 360bbb-3(b)(1), unless the authorization is  terminated or revoked. Performed at [facility], [place] [address], [place], [place] [address]   Expectorated sputum assessment w rflx to resp cult     Status: None   Collection Time: 10/06/19  5:41 AM   Specimen: Expectorated Sputum  Result Value Ref Range Status   Specimen Description Expect. Sput  Final   Special Requests NONE  Final   Sputum evaluation   Final    THIS SPECIMEN IS ACCEPTABLE FOR SPUTUM CULTURE Performed at [facility], [address] [place], [place] [address]    Report Status 10/06/2019 FINAL  Final  Culture, respiratory     Status: None (Preliminary result)   Collection Time: 10/06/19  5:41 AM  Result Value Ref Range Status   Specimen Description Expect. Sput  Final   Special Requests NONE Reflexed from [id number]  Final   Gram Stain   Final    MODERATE WBC PRESENT, PREDOMINANTLY PMN FEW GRAM POSITIVE COCCI IN CLUSTERS FEW GRAM NEGATIVE RODS Performed at [facility], [place] [address], [place], [place] [address]    Culture PENDING  Incomplete   Report Status PENDING  Incomplete    Radiology Reports DG Chest 2 View  Result Date: 10/05/2019 CLINICAL DATA:  Hypoxemia, history of partial right pneumonectomy EXAM: CHEST - 2 VIEW COMPARISON:  10/13/2017 FINDINGS: Frontal and lateral views of the chest demonstrate a stable cardiac silhouette. There are stable postsurgical changes in the right hemithorax from partial right pneumonectomy, with extensive pleural thickening. There is prominent bilateral parenchymal lung scarring and fibrosis, without significant change since prior exam. No superimposed airspace disease, effusion, or pneumothorax. IMPRESSION: 1. Stable postsurgical changes from partial right pneumonectomy. 2. Stable bilateral parenchymal scarring and fibrosis. No acute airspace disease. Electronically Signed   By: Randa Ngo M.D.   On: 10/05/2019 16:29     Phillips Climes M.D on 10/07/2019 at 12:41 PM    [facility] -  Office  [phone]

## 2019-10-08 LAB — CBC
HCT: 37.4 % — ABNORMAL LOW (ref 39.0–52.0)
Hemoglobin: 12.3 g/dL — ABNORMAL LOW (ref 13.0–17.0)
MCH: 31.1 pg (ref 26.0–34.0)
MCHC: 32.9 g/dL (ref 30.0–36.0)
MCV: 94.4 fL (ref 80.0–100.0)
Platelets: 145 10*3/uL — ABNORMAL LOW (ref 150–400)
RBC: 3.96 MIL/uL — ABNORMAL LOW (ref 4.22–5.81)
RDW: 12 % (ref 11.5–15.5)
WBC: 4.7 10*3/uL (ref 4.0–10.5)
nRBC: 0 % (ref 0.0–0.2)

## 2019-10-08 LAB — BASIC METABOLIC PANEL
Anion gap: 9 (ref 5–15)
BUN: 22 mg/dL (ref 8–23)
CO2: 25 mmol/L (ref 22–32)
Calcium: 8.5 mg/dL — ABNORMAL LOW (ref 8.9–10.3)
Chloride: 105 mmol/L (ref 98–111)
Creatinine, Ser: 1.25 mg/dL — ABNORMAL HIGH (ref 0.61–1.24)
GFR calc Af Amer: >60 mL/min (ref >60)
GFR calc non Af Amer: >60 mL/min (ref >60)
Glucose, Bld: 95 mg/dL (ref 70–99)
Potassium: 3.7 mmol/L (ref 3.5–5.1)
Sodium: 139 mmol/L (ref 135–145)

## 2019-10-08 MED ORDER — CIPROFLOXACIN HCL 500 MG PO TABS
500.0000 mg | ORAL_TABLET | Freq: Two times a day (BID) | ORAL | 0 refills | Status: AC
Start: 2019-10-08 — End: 2019-10-19

## 2019-10-08 MED ORDER — RISAQUAD PO CAPS: 2.0000 | ORAL_CAPSULE | Freq: Every day | ORAL | 0 refills | Status: AC

## 2019-10-08 MED ORDER — AZITHROMYCIN 250 MG PO TABS: 250.0000 mg | ORAL_TABLET | Freq: Every day | ORAL | 0 refills | Status: AC

## 2019-10-08 NOTE — Discharge Summary (Signed)
[name], is a 64 y.o. male  DOB [birth year]  MRN [id number].  Admission date:  10/05/2019  Admitting Physician  Albertine [name], MD  Discharge Date:  10/08/2019   Primary MD  Aura Dials, MD  Recommendations for primary care physician for things to follow:  - please check CBC, CMP during next visit.   Admission Diagnosis  Respiratory infection [J98.8] Hypoxia [R09.02] History of bronchiectasis [Z87.09] Bronchiectasis with (acute) exacerbation (HCC) [J47.1] Bronchiectasis with acute exacerbation (HCC) [J47.1]   Discharge Diagnosis  Respiratory infection [J98.8] Hypoxia [R09.02] History of bronchiectasis [Z87.09] Bronchiectasis with (acute) exacerbation (HCC) [J47.1] Bronchiectasis with acute exacerbation (HCC) [J47.1]    Principal Problem:   Bronchiectasis with (acute) exacerbation (HCC) Active Problems:   Renal insufficiency   Bronchiectasis with acute exacerbation (HCC)      Past Medical History:  Diagnosis Date  . Bronchiectasis ([place])   . Bronchiolitis   . Dizziness and giddiness 07/26/2012   none recent  . DJD (degenerative joint disease)    back and neck  . Dyspnea    with walking long distances  . Gastroesophageal reflux disease   . History of kidney stones yrs ago   passed on own  . HTN (hypertension)   . OSA (obstructive sleep apnea)    none since 30 lb weight loss  . Pneumonia last time 2017  . Pneumonia with lung abscess ([place])   . PONV (postoperative nausea and vomiting)    likes scopolamine patch, trouble turning neck to left  . Prostate cancer ([place]) dx Aug 12, 2017  . Tear of left rotator cuff    healed on own    Past Surgical History:  Procedure Laterality Date  . APPENDECTOMY  1969  . BACK SURGERY     multiple, DJD, cervical fusion, plate removed  . CARPAL TUNNEL RELEASE Right 1999  . COLONOSCOPY  12/1980   bleeding from penis and anus after  beach trip; colonoscopy and removed urinary stricture at same time  . KNEE ARTHROSCOPY     left  . LUNG SURGERY  2012,    removed right middle lobe,due to birth defect, removed half of right bottom lobe due to abcess  . LUNG SURGERY  07/2013   removed bottom right middle and lower lobe  . LYMPHADENECTOMY Bilateral 12/08/2017   Procedure: LYMPHADENECTOMY;  Surgeon: Alexis Frock, MD;  Location: [facility];  Service: Urology;  Laterality: Bilateral;  . NECK SURGERY  [id number], 2002  . OTHER SURGICAL HISTORY  2016   removed 4" of lower right rib  . OTHER SURGICAL HISTORY     has had 8 PICC lines placed over the course of several years.  . Right video-assisted thoracoscopic surgery,  12/05/2010  . ROBOT ASSISTED LAPAROSCOPIC RADICAL PROSTATECTOMY N/A 12/08/2017   Procedure: XI ROBOTIC ASSISTED LAPAROSCOPIC RADICAL PROSTATECTOMY;  Surgeon: Alexis Frock, MD;  Location: [facility];  Service: Urology;  Laterality: N/A;  . TONSILLECTOMY  1965  . VIDEO BRONCHOSCOPY     Dr Arlyce Dice  History of present illness and  Hospital Course:     Kindly see H&P for history of present illness and admission details, please review complete Labs, Consult reports and Test reports for all details in brief  HPI  from the history and physical done on the day of admission 10/05/2019   HPI: [name] is a 64 y.o. male with medical history significant for prostate cancer status post resection, hypertension, bronchiectasis with Pseudomonas colonization and history of pneumonectomy, now presenting to the emergency department with progressive shortness of breath and cough.  Patient reports progressive worsening in his chronic dyspnea over the past couple days as well as increased cough which has been productive of purulent sputum.  He has been short of breath even at rest and is oxygen saturation has been low at home.  Patient reports recent chills and reports a temperature of 99.0 F at home.  He reports history of  right middle and right lower lobectomy in 2015, was being considered for lung transplant at some point but has more recently been told that it would likely not be possible.  He follows with pulmonology at [facility]. He is fully vaccinated against COVID-19.  He reports history of prostate cancer status post prostatectomy, continues to follow with urology every 6 months, and reports that there has been no evidence for disease recurrence.  ED Course: Upon arrival to the ED, patient is found to be afebrile, saturating low 90s on 2 L/min of supplemental oxygen, and with stable blood pressure.  Chest x-rays negative for acute cardiopulmonary disease.  Chemistry panel is notable for creatinine of 1.61, up from 0.97 in 2019.  CBC features a leukocytosis to 16,500.  ED physician discussed the case with pulmonology at [facility], there were no beds available at their facility, but admission and treatment with Zosyn was recommended.  Patient was treated with albuterol and Zosyn in the emergency department, started on supplemental oxygen, and hospitalist consulted for admission.  Hospital Course  Bronchiectasis with acute exacerbation (HCC)   Bronchiectasis with acute exacerbation -Patient with bronchiectasis presenting with increased SOB and purulent sputum production, found to have new 1-2 Lpm supplemental O2 requirement on presentation, and [facility] pulmonology recommended treatment with Zosyn -Given known history of Pseudomonas colonization, he was treated with IV Zosyn during hospital stay, with IV antibiotic treatment her symptoms significantly improved, dyspnea has resolved, his cough has significantly subsided, as well his cough is much less productive at this point, so he will be able to discharge on oral regimen, he has been treated in the past multiple times by his pulmonary at [facility] with Cipro x14 days, and the recommendation on medical record to treat for total of 14 days, so patient will be discharged on another 11  days of oral ciprofloxacin to finish total of 14 days of treatment . . -He was encouraged again to keep using his incentive spirometry and flutter valve at home. -Final results of sputum cultures are pending, but his Gram stain appears to be similar to his previous cultures done at [facility] multiple times in the past,  2. AKI -SCr is 1.61 in ED, up from 0.97 in 2019, this has significantly improved, it is 1.2 on discharge   Discharge Condition:  stable   Follow UP   Follow-up Information    Aura Dials, MD Follow up.   Specialty: Family Medicine Contact information: [place] [address] [phone]  Discharge Instructions  and  Discharge Medications    Discharge Instructions    Discharge instructions   Complete by: As directed    Follow with Primary MD Aura Dials, MD in 7 days   Get CBC, CMP,  checked  by Primary MD next visit.    Activity: As tolerated with Full fall precautions use walker/cane & assistance as needed   Disposition Home    Diet: Regular Diet  On your next visit with your primary care physician please Get Medicines reviewed and adjusted.   Please request your Prim.MD to go over all Hospital Tests and Procedure/Radiological results at the follow up, please get all Hospital records sent to your Prim MD by signing hospital release before you go home.   If you experience worsening of your admission symptoms, develop shortness of breath, life threatening emergency, suicidal or homicidal thoughts you must seek medical attention immediately by calling 911 or calling your MD immediately  if symptoms less severe.  You Must read complete instructions/literature along with all the possible adverse reactions/side effects for all the Medicines you take and that have been prescribed to you. Take any new Medicines after you have completely understood and accpet all the possible adverse reactions/side effects.   Do not  drive, operating heavy machinery, perform activities at heights, swimming or participation in water activities or provide baby sitting services if your were admitted for syncope or siezures until you have seen by Primary MD or a Neurologist and advised to do so again.  Do not drive when taking Pain medications.    Do not take more than prescribed Pain, Sleep and Anxiety Medications  Special Instructions: If you have smoked or chewed Tobacco  in the last 2 yrs please stop smoking, stop any regular Alcohol  and or any Recreational drug use.  Wear Seat belts while driving.   Please note  You were cared for by a hospitalist during your hospital stay. If you have any questions about your discharge medications or the care you received while you were in the hospital after you are discharged, you can call the unit and asked to speak with the hospitalist on call if the hospitalist that took care of you is not available. Once you are discharged, your primary care physician will handle any further medical issues. Please note that NO REFILLS for any discharge medications will be authorized once you are discharged, as it is imperative that you return to your primary care physician (or establish a relationship with a primary care physician if you do not have one) for your aftercare needs so that they can reassess your need for medications and monitor your lab values.   Increase activity slowly   Complete by: As directed      Allergies as of 10/08/2019      Reactions   Gentamicin Other (See Comments)   BALANCE ISSUES   Morphine And Related Nausea And Vomiting   Tobramycin Nausea And Vomiting, Other (See Comments)   Ototoxicity, when administered in IV       Medication List    STOP taking these medications   cephALEXin 500 MG capsule Commonly known as: Keflex     TAKE these medications   acidophilus Caps capsule Take 2 capsules by mouth daily. Start taking on: October 09, 2019   albuterol (2.5  MG/3ML) 0.083% nebulizer solution Commonly known as: PROVENTIL Inhale 2.5 mg into the lungs 4 (four) times daily.   albuterol 108 (90 Base) MCG/ACT inhaler Commonly known  as: VENTOLIN HFA Inhale 2 puffs into the lungs every 4 (four) hours as needed for wheezing or shortness of breath.   ascorbic acid 250 MG Chew Commonly known as: VITAMIN C Chew 1,000 mg by mouth 2 (two) times daily.   azithromycin 250 MG tablet Commonly known as: ZITHROMAX Take 1 tablet (250 mg total) by mouth daily. Start taking on: October 19, 2019 What changed: These instructions start on October 19, 2019. If you are unsure what to do until then, ask your doctor or other care provider.   budesonide-formoterol 160-4.5 MCG/ACT inhaler Commonly known as: SYMBICORT Inhale 2 puffs into the lungs 2 (two) times daily.   Cholecalciferol 25 MCG (1000 UT) tablet Take 1,000 Units by mouth daily.   ciprofloxacin 500 MG tablet Commonly known as: Cipro Take 1 tablet (500 mg total) by mouth 2 (two) times daily for 11 days.   Colistimethate Sodium Powd Take 2 mLs by nebulization 2 (two) times daily. Use only every other month. 150mg  injection.   dornase alpha 1 MG/ML nebulizer solution Commonly known as: PULMOZYME Take 2.5 mg by nebulization daily. Use at least 30-60 minutes before airway clearance or after airway clearance   doxylamine (Sleep) 25 MG tablet Commonly known as: UNISOM Take 25 mg by mouth at bedtime.   ferrous sulfate 325 (65 FE) MG tablet Take 325 mg by mouth 2 (two) times daily.   fluticasone 50 MCG/ACT nasal spray Commonly known as: FLONASE Place 2 sprays into both nostrils daily.   Flutter Devi Use several times daily as needed for congestion/thick mucus   HYDROcodone-acetaminophen 7.5-325 MG tablet Commonly known as: NORCO Take 1 tablet by mouth every 6 (six) hours as needed for moderate pain. What changed: when to take this   HYDROcodone-homatropine 5-1.5 MG/5ML syrup Commonly known as:  HYCODAN Take 5 mLs by mouth every 6 (six) hours as needed for cough.   montelukast 10 MG tablet Commonly known as: SINGULAIR Take 10 mg by mouth at bedtime.   multivitamin with minerals Tabs tablet Take 1 tablet by mouth daily.   NON FORMULARY Use Acapella twice daily for airway clearance. Dx. 494.0. Mucous clearing device.   omeprazole 20 MG capsule Commonly known as: PRILOSEC Take 20 mg by mouth 2 (two) times daily.   Sodium Chloride 10 % Nebu Inhale 5 mLs into the lungs 2 (two) times daily.   sterile water for irrigation 4 mLs daily. Mix 21ml with colistimethate powder to use for nebulization. 28 days on and 28 days off.   tobramycin (PF) 300 MG/5ML nebulizer solution Commonly known as: [name] Take 300 mg by nebulization 2 (two) times daily. Use only every other month         Diet and Activity recommendation: See Discharge Instructions above   Consults obtained -  None   Major procedures and Radiology Reports - PLEASE review detailed and final reports for all details, in brief -      DG Chest 2 View  Result Date: 10/05/2019 CLINICAL DATA:  Hypoxemia, history of partial right pneumonectomy EXAM: CHEST - 2 VIEW COMPARISON:  10/13/2017 FINDINGS: Frontal and lateral views of the chest demonstrate a stable cardiac silhouette. There are stable postsurgical changes in the right hemithorax from partial right pneumonectomy, with extensive pleural thickening. There is prominent bilateral parenchymal lung scarring and fibrosis, without significant change since prior exam. No superimposed airspace disease, effusion, or pneumothorax. IMPRESSION: 1. Stable postsurgical changes from partial right pneumonectomy. 2. Stable bilateral parenchymal scarring and fibrosis. No acute airspace  disease. Electronically Signed   By: Randa Ngo M.D.   On: 10/05/2019 16:29    Micro Results    Recent Results (from the past 240 hour(s))  SARS Coronavirus 2 by RT PCR (hospital order, performed  in [facility] hospital lab) Nasopharyngeal Nasopharyngeal Swab     Status: None   Collection Time: 10/05/19 11:23 PM   Specimen: Nasopharyngeal Swab  Result Value Ref Range Status   SARS Coronavirus 2 NEGATIVE NEGATIVE Final    Comment: (NOTE) SARS-CoV-2 target nucleic acids are NOT DETECTED.  The SARS-CoV-2 RNA is generally detectable in upper and lower respiratory specimens during the acute phase of infection. The lowest concentration of SARS-CoV-2 viral copies this assay can detect is 250 copies / mL. A negative result does not preclude SARS-CoV-2 infection and should not be used as the sole basis for treatment or other patient management decisions.  A negative result may occur with improper specimen collection / handling, submission of specimen other than nasopharyngeal swab, presence of viral mutation(s) within the areas targeted by this assay, and inadequate number of viral copies (<250 copies / mL). A negative result must be combined with clinical observations, patient history, and epidemiological information.  Fact Sheet for Patients:   StrictlyIdeas.no  Fact Sheet for Healthcare Providers: BankingDealers.co.za  This test is not yet approved or  cleared by the [place] FDA and has been authorized for detection and/or diagnosis of SARS-CoV-2 by FDA under an Emergency Use Authorization (EUA).  This EUA will remain in effect (meaning this test can be used) for the duration of the COVID-19 declaration under Section 564(b)(1) of the Act, 21 U.S.C. section 360bbb-3(b)(1), unless the authorization is terminated or revoked sooner.  Performed at [facility], [place] [address], [place], [place] [address]   Culture, blood (routine x 2)     Status: None (Preliminary result)   Collection Time: 10/06/19  1:00 AM   Specimen: BLOOD  Result Value Ref Range Status   Specimen Description BLOOD RIGHT FOREARM  Final   Special  Requests   Final    BOTTLES DRAWN AEROBIC AND ANAEROBIC Blood Culture results may not be optimal due to an inadequate volume of blood received in culture bottles   Culture   Final    NO GROWTH 1 DAY Performed at [facility], [place] [address], [place], [place] [address]    Report Status PENDING  Incomplete  Culture, blood (routine x 2)     Status: None (Preliminary result)   Collection Time: 10/06/19  1:05 AM   Specimen: BLOOD  Result Value Ref Range Status   Specimen Description BLOOD LEFT FOREARM  Final   Special Requests   Final    BOTTLES DRAWN AEROBIC AND ANAEROBIC Blood Culture results may not be optimal due to an inadequate volume of blood received in culture bottles   Culture   Final    NO GROWTH 1 DAY Performed at [facility], [place] [address] [place], [place] [address]    Report Status PENDING  Incomplete  Respiratory Panel by RT PCR (Flu A&B, Covid) -     Status: None   Collection Time: 10/06/19  1:50 AM  Result Value Ref Range Status   SARS Coronavirus 2 by RT PCR NEGATIVE NEGATIVE Final    Comment: (NOTE) SARS-CoV-2 target nucleic acids are NOT DETECTED.  The SARS-CoV-2 RNA is generally detectable in upper respiratoy specimens during the acute phase of infection. The lowest concentration of SARS-CoV-2 viral  copies this assay can detect is 131 copies/mL. A negative result does not preclude SARS-Cov-2 infection and should not be used as the sole basis for treatment or other patient management decisions. A negative result may occur with  improper specimen collection/handling, submission of specimen other than nasopharyngeal swab, presence of viral mutation(s) within the areas targeted by this assay, and inadequate number of viral copies (<131 copies/mL). A negative result must be combined with clinical observations, patient history, and epidemiological information. The expected result is Negative.  Fact Sheet for Patients:    PinkCheek.be  Fact Sheet for Healthcare Providers:  GravelBags.it  This test is no t yet approved or cleared by the [place] FDA and  has been authorized for detection and/or diagnosis of SARS-CoV-2 by FDA under an Emergency Use Authorization (EUA). This EUA will remain  in effect (meaning this test can be used) for the duration of the COVID-19 declaration under Section 564(b)(1) of the Act, 21 U.S.C. section 360bbb-3(b)(1), unless the authorization is terminated or revoked sooner.     Influenza A by PCR NEGATIVE NEGATIVE Final   Influenza B by PCR NEGATIVE NEGATIVE Final    Comment: (NOTE) The Xpert Xpress SARS-CoV-2/FLU/RSV assay is intended as an aid in  the diagnosis of influenza from Nasopharyngeal swab specimens and  should not be used as a sole basis for treatment. Nasal washings and  aspirates are unacceptable for Xpert Xpress SARS-CoV-2/FLU/RSV  testing.  Fact Sheet for Patients: PinkCheek.be  Fact Sheet for Healthcare Providers: GravelBags.it  This test is not yet approved or cleared by the [place] FDA and  has been authorized for detection and/or diagnosis of SARS-CoV-2 by  FDA under an Emergency Use Authorization (EUA). This EUA will remain  in effect (meaning this test can be used) for the duration of the  Covid-19 declaration under Section 564(b)(1) of the Act, 21  U.S.C. section 360bbb-3(b)(1), unless the authorization is  terminated or revoked. Performed at [facility], [place] [address], [place], [place] [address]   Expectorated sputum assessment w rflx to resp cult     Status: None   Collection Time: 10/06/19  5:41 AM   Specimen: Expectorated Sputum  Result Value Ref Range Status   Specimen Description Expect. Sput  Final   Special Requests NONE  Final   Sputum evaluation   Final    THIS SPECIMEN IS ACCEPTABLE FOR SPUTUM  CULTURE Performed at [facility], [address], [place], [place] [address]    Report Status 10/06/2019 FINAL  Final  Culture, respiratory     Status: None (Preliminary result)   Collection Time: 10/06/19  5:41 AM  Result Value Ref Range Status   Specimen Description Expect. Sput  Final   Special Requests NONE Reflexed from [id number]  Final   Gram Stain   Final    MODERATE WBC PRESENT, PREDOMINANTLY PMN FEW GRAM POSITIVE COCCI IN CLUSTERS FEW GRAM NEGATIVE RODS Performed at [facility], [place] [address] [place], [place] [address]    Culture PENDING  Incomplete   Report Status PENDING  Incomplete       Today   Subjective:   [name] today has no headache,no chest abdominal pain,no new weakness tingling or numbness, feels much better wants to go home today.  Further dyspnea, cough has improved, and less productive  Objective:   Blood pressure (!) 126/98, pulse 71, temperature 97.7 F (36.5 C), resp. rate 16, height 5\' 8"  (1.727 m), weight 77.1 kg,  SpO2 97 %.   Intake/Output Summary (Last 24 hours) at 10/08/2019 1213 Last data filed at 10/07/2019 2125 Gross per 24 hour  Intake 6 ml  Output --  Net 6 ml    Exam Awake Alert, Oriented x 3, No new F.N deficits, Normal affect [place].AT,PERRAL Supple Neck,No JVD, No cervical lymphadenopathy appriciated.  Symmetrical Chest wall movement, Good air movement bilaterally, scattered bilateral rales. RRR,No Gallops,Rubs or new Murmurs, No Parasternal Heave +ve B.Sounds, Abd Soft, Non tender, No organomegaly appriciated, No rebound -guarding or rigidity. No Cyanosis, Clubbing or edema, No new Rash or bruise  Data Review   CBC w Diff:  Lab Results  Component Value Date   WBC 4.7 10/08/2019   HGB 12.3 (L) 10/08/2019   HCT 37.4 (L) 10/08/2019   PLT 145 (L) 10/08/2019   LYMPHOPCT 4 10/05/2019   MONOPCT 7 10/05/2019   EOSPCT 1 10/05/2019   BASOPCT 0 10/05/2019    CMP:  Lab Results  Component Value Date   NA 139  10/08/2019   NA 137 07/26/2012   K 3.7 10/08/2019   CL 105 10/08/2019   CO2 25 10/08/2019   BUN 22 10/08/2019   BUN 36 (H) 07/26/2012   CREATININE 1.25 (H) 10/08/2019   PROT 7.5 10/05/2019   PROT 7.0 07/26/2012   ALBUMIN 3.7 10/05/2019   ALBUMIN 4.7 07/26/2012   BILITOT 0.9 10/05/2019   ALKPHOS 83 10/05/2019   AST 36 10/05/2019   ALT 19 10/05/2019  .   Total Time in preparing paper work, data evaluation and todays exam - 32 minutes  Phillips Climes M.D on 10/08/2019 at 12:13 PM  [facility]   Office  [phone]

## 2019-10-08 NOTE — Discharge Instructions (Signed)
Follow with Primary MD Aura Dials, MD in 7 days   Get CBC, CMP,  checked  by Primary MD next visit.    Activity: As tolerated with Full fall precautions use walker/cane & assistance as needed   Disposition Home    Diet: Regular Diet  On your next visit with your primary care physician please Get Medicines reviewed and adjusted.   Please request your Prim.MD to go over all Hospital Tests and Procedure/Radiological results at the follow up, please get all Hospital records sent to your Prim MD by signing hospital release before you go home.   If you experience worsening of your admission symptoms, develop shortness of breath, life threatening emergency, suicidal or homicidal thoughts you must seek medical attention immediately by calling 911 or calling your MD immediately  if symptoms less severe.  You Must read complete instructions/literature along with all the possible adverse reactions/side effects for all the Medicines you take and that have been prescribed to you. Take any new Medicines after you have completely understood and accpet all the possible adverse reactions/side effects.   Do not drive, operating heavy machinery, perform activities at heights, swimming or participation in water activities or provide baby sitting services if your were admitted for syncope or siezures until you have seen by Primary MD or a Neurologist and advised to do so again.  Do not drive when taking Pain medications.    Do not take more than prescribed Pain, Sleep and Anxiety Medications  Special Instructions: If you have smoked or chewed Tobacco  in the last 2 yrs please stop smoking, stop any regular Alcohol  and or any Recreational drug use.  Wear Seat belts while driving.   Please note  You were cared for by a hospitalist during your hospital stay. If you have any questions about your discharge medications or the care you received while you were in the hospital after you are discharged,  you can call the unit and asked to speak with the hospitalist on call if the hospitalist that took care of you is not available. Once you are discharged, your primary care physician will handle any further medical issues. Please note that NO REFILLS for any discharge medications will be authorized once you are discharged, as it is imperative that you return to your primary care physician (or establish a relationship with a primary care physician if you do not have one) for your aftercare needs so that they can reassess your need for medications and monitor your lab values.

## 2019-10-08 NOTE — Progress Notes (Signed)
D/c summary given to pt.  Pt. Verbalized understanding to all d/c instructions. IV site d/c. With no issues. Pt stable in no distress.  Pt. Transported to main entrance via w/c with this nurse.

## 2019-10-11 LAB — CULTURE, BLOOD (ROUTINE X 2)
Culture: NO GROWTH
Culture: NO GROWTH

## 2019-10-13 NOTE — Unmapped (Signed)
Patient does not need a refill of specialty medication at this time. Moving specialty refill reminder call to appropriate date and removed call attempt data.  Spoke with patient and he states he is currently on Colistimethate right now and he has enough to get him thru the end of the month and then he will start Tobramycin on Aug 1st, 2021 and will not start Colistimethate till 11/22/2019. Patient denied refills at this time, but would like a call back toward the end of Aug. Patient confirmed he has not missed any doses but did start on Cipro 750mg , where he is taking 1 tab twice a day til 10/19/2019.

## 2019-10-18 LAB — SUSCEPTIBILITY, AER + ANAEROB

## 2019-10-18 LAB — SUSCEPTIBILITY RESULT

## 2019-10-19 DIAGNOSIS — J479 Bronchiectasis, uncomplicated: Principal | ICD-10-CM

## 2019-10-19 NOTE — Unmapped (Signed)
Attempted to reach patient to check in on him following recent hospitalization.     Left my direct office line number for call back. Will send him a MyChart message.

## 2019-10-19 NOTE — Unmapped (Signed)
Was not able to reach Jordan [name] today. Left voicemail for him to call my direct office line. Have also sent him a MyChart message.

## 2019-10-24 ENCOUNTER — Telehealth (HOSPITAL_COMMUNITY): Payer: Self-pay | Admitting: Family Medicine

## 2019-11-03 DIAGNOSIS — J479 Bronchiectasis, uncomplicated: Principal | ICD-10-CM

## 2019-11-03 MED ORDER — SYMBICORT 160 MCG-4.5 MCG/ACTUATION HFA AEROSOL INHALER
RESPIRATORY_TRACT | 1 refills | 0.00000 days | Status: CP
Start: 2019-11-03 — End: ?

## 2019-11-04 LAB — CULTURE, RESPIRATORY W GRAM STAIN

## 2019-11-07 NOTE — Unmapped (Signed)
[facility] Refill Coordination Note    Specialty Medication(s) to be Shipped:   CF/Pulmonary: -Nebulized colistin (150mg  vials) and supply kit    Other medication(s) to be shipped: Alcohol pred, Syringe, LC plus and Sodium chloride, and sterile water     Jordan [name], DOB: 31-Oct-1955  Phone: [phone] (home)       All above HIPAA information was verified with patient.     Was a Nurse, learning disability used for this call? No    Completed refill call assessment today to schedule patient's medication shipment from the [facility] [phone]).       Specialty medication(s) and dose(s) confirmed: Regimen is correct and unchanged.   Changes to medications: Jordan [name] reports no changes at this time.  Changes to insurance: No  Questions for the pharmacist: No    Confirmed patient received Welcome Packet with first shipment. The patient will receive a drug information handout for each medication shipped and additional FDA Medication Guides as required.       DISEASE/MEDICATION-SPECIFIC INFORMATION        N/A    SPECIALTY MEDICATION ADHERENCE     Medication Adherence    Patient reported X missed doses in the last month: 0  Support network for adherence: family member          Nebulized colistin (150mg  vials) and supply kit: 0 days worth of medication on hand.            SHIPPING     Shipping address confirmed in Epic.     Delivery Scheduled: Yes, Expected medication delivery date: 11/10/19.     Medication will be delivered via UPS to the prescription address in Epic WAM.    Jordan [name]   [facility] Specialty Technician

## 2019-11-09 MED FILL — SODIUM CHLORIDE 10 % FOR NEBULIZATION: 30 days supply | Qty: 900 | Fill #2 | Status: AC

## 2019-11-09 MED FILL — LC PLUS MISC: 30 days supply | Qty: 2 | Fill #2

## 2019-11-09 MED FILL — BD LUER-LOK SYRINGE 3 ML 20 GAUGE X 1": 30 days supply | Qty: 60 | Fill #1

## 2019-11-09 MED FILL — ALCOHOL PREP PADS: 50 days supply | Qty: 100 | Fill #2

## 2019-11-09 MED FILL — WATER FOR INJECTION, STERILE INJECTION SOLUTION: 30 days supply | Qty: 600 | Fill #2 | Status: AC

## 2019-11-09 MED FILL — COLISTIN (COLISTIMETHATE SODIUM) 150 MG SOLUTION FOR INJECTION: 30 days supply | Qty: 60 | Fill #2

## 2019-11-09 MED FILL — WATER FOR INJECTION, STERILE INJECTION SOLUTION: 30 days supply | Qty: 600 | Fill #2

## 2019-11-09 MED FILL — SODIUM CHLORIDE 10 % FOR NEBULIZATION: RESPIRATORY_TRACT | 30 days supply | Qty: 900 | Fill #2

## 2019-11-09 MED FILL — BD LUER-LOK SYRINGE 3 ML 20 GAUGE X 1": 30 days supply | Qty: 60 | Fill #1 | Status: AC

## 2019-11-09 MED FILL — LC PLUS MISC: 30 days supply | Qty: 2 | Fill #2 | Status: AC

## 2019-11-09 MED FILL — ALCOHOL PREP PADS: 50 days supply | Qty: 100 | Fill #2 | Status: AC

## 2019-11-09 MED FILL — COLISTIN (COLISTIMETHATE SODIUM) 150 MG SOLUTION FOR INJECTION: 30 days supply | Qty: 60 | Fill #2 | Status: AC

## 2019-11-10 ENCOUNTER — Encounter: Admit: 2019-11-10 | Discharge: 2019-11-11 | Payer: MEDICARE

## 2019-11-10 ENCOUNTER — Encounter: Admit: 2019-11-10 | Discharge: 2019-11-11 | Payer: MEDICARE | Attending: Internal Medicine | Primary: Internal Medicine

## 2019-11-10 DIAGNOSIS — R0902 Hypoxemia: Principal | ICD-10-CM

## 2019-11-10 DIAGNOSIS — R400 Somnolence: Principal | ICD-10-CM

## 2019-11-10 DIAGNOSIS — A498 Other bacterial infections of unspecified site: Principal | ICD-10-CM

## 2019-11-10 DIAGNOSIS — R091 Pleurisy: Principal | ICD-10-CM

## 2019-11-10 DIAGNOSIS — J479 Bronchiectasis, uncomplicated: Principal | ICD-10-CM

## 2019-11-10 MED ORDER — HYDROCODONE-HOMATROPINE 5 MG-1.5 MG/5 ML ORAL SYRUP
ORAL | 0 refills | 4.00000 days | Status: CP | PRN
Start: 2019-11-10 — End: ?

## 2019-11-10 NOTE — Unmapped (Addendum)
Order of Treatments  1. Bronchodilator (Albuterol/Xopenex/Ipratropium): Relaxes the large smooth muscle and opens up the lungs. If using neb, can be done with airway clearance.  2. Hypertonic Saline 10%: Hydrates the lungs and helps to thin the mucus to make it easier to cough up. Can be done with airway clearance.  3. Airway Clearance (Vest): Helps to mobilize the mucus to make it easier to cough up.  4. Pulmozyme: Mucolytic, breaks down cellular DNA and thins the mucus to make it easier to cough up. To be done after airway clearance.   5. Inhaled Antibiotics (Colistin and Tobramycin): Helps to clear infections; to be done after lungs are open and cleaned out.  6. Inhaled Corticosteroids (Symbicort): Anti-inflammatory; maintenance medication; take daily even if you feel fine. To be done after lungs are open and cleaned out.    Getting chest CT done today.  Getting walk test and overnight oximetry testing.  Looking into the pulmonary rehab referral.    If you need oxygen, I will reach out to Dr. Acquanetta Belling at [facility] about your candidacy there for lung transplant.    Symptoms of a bronchiectasis exacerbation: 48 hours of at least three of the following:  ?? Increased cough  ?? Change in volume or appearance of sputum  ?? Increased sputum purulence  ?? Worsening shortness of breath and/or exercise tolerance  ?? Fatigue and/or malaise  ?? Coughing up blood (hemoptysis)    If you are experiencing some of these symptoms:  ?? Increase the frequency and/or intensity of your airway clearance  ?? Try to submit a sputum sample for bacterial and AFB cultures (at [facility] or locally)  ?? Reach out to me or your local physician.  ?? If you need antibiotics, recommend treating for 14 days.      Please bring any new airway clearance equipment to your next visit to ensure that you are using and caring for it properly.    Information about the COVID-19 vaccines: yourshot.org    Interested in participating in COVID-19 vaccine trials? https://www.coronaviruspreventionnetwork.org    Thank you for allowing me to be a part of your care. Please call the clinic with any questions.    Viona Gilmore, MD, MPH  Pulmonary and Critical Care Medicine  [address]  [id number]  [place], [place] [address]    Thank you for your visit to the [facility]. You may receive a survey from [facility] regarding your visit today, and we are eager to use this feedback to improve your experience. Thank you for taking the time to fill it out.    Between appointments, you can reach [place] at these numbers:  ??? For appointments: [phone]  ??? For my nurse, Arsenio Loader RN: [phone]  ??? Fax: [phone]  ??? For urgent issues after hours: Hospital Operator @ [phone] & ask for Pulmonary Fellow on call    For further information, check out the websites below:    [facility] for Bronchiectasis: [email or web address]    Impact Airway Clearance Education: http://www.impact-be.com    Videos from the patient session of the World Bronchiectasis Conference in [place] on October 03, 2016 can be watched here (TrustyNews.es).     Bronchiectasis Toolbox (information about airway clearance): DiningCalendar.de    Copy for Individuals with Bronchiectasis and/or NTM through the Bronchiectasis Registry and the COPD Foundation: https://www.mckee-young.org/    Information about Non-tuberculous Mycobacterial Infections:  https://www.ntminfo.org     Interested in clinical trials and other research opportunities?  www.clinicaltrials.gov    The Rare Diseases Clinical Research Network [facility]) Genetic Disorders of  Mucociliary Clearance Consortium [facility]) Contact Registry is a way for patients with disorders of mucociliary clearance (such as bronchiectasis) and their family members to learn about research studies they may be able to join. Participation is completely voluntary and you may choose to withdraw at any time. There is no cost to join the [employer]. For more information or to join the registry please go to the following website:  [email or web address]

## 2019-11-10 NOTE — Unmapped (Signed)
[name]    11/10/19    11:57 AM  -  Assessment:      Patient:Jordan [name] (Jul 11, 1955)    [name] is a 64 y.o. male who is seen for follow up of bronchiectasis with chronic Pseudomonas colonization  His FVC has improved from prior but his FEV1 remained stable after slow decline following resection.  Two recent 6-minute walk test that failed to reveal evidence of exertional hypoxemia.  Respiratory therapist who performed second test noted that Jordan [name]'s home pulse oximeter reads 3 to 4% lower than the facility pulse oximeter.  All his recent chest CT scan does show suggestive of a worsening infection, this was obtained within a few weeks of being treated for an exacerbation.  Unclear if this represents a new baseline or an acute change.     Plan:      Problem List Items Addressed This Visit        Respiratory    Bronchiectasis (CMS-HCC)        No changes were made to his airway clearance regimen or inhaled therapies.  He will continue his inhaled antibiotics as well as chronic azithromycin therapy.    He was able to expectorate a sputum sample for bacterial and AFB cultures.  There are standing orders in our system.    I placed a referral to a local pulmonary rehab program.  I think this will help improve Jordan [name]'s dyspnea.    We discussed whether lung transplantation was an option for Jordan [name].  While he is not currently on oxygen, I am deeply concerned about his continued decline in lung function in the absence of repeated exacerbations.  I reviewed his case with Dr. Baldo [name].  Since Jordan [name] had low-grade prostate cancer and has no evidence of disease following his diagnosis and surgery in 2019, this would not prevent him from being a transplant candidate.  After his visit today, I was able to again connect with Dr. Dudley [name] who spoke with Dr. Oneida [name], one of our transplant surgeons who had performed 1 of Jordan [name]'s pulmonary resections.  Due to his prior resections and development of scar tissue, lung transplantation would be far more complicated for him.  By the time he is a transplant candidate due to progression of his lung disease, he would be older and less apt to tolerate the surgery.  Therefore, he is not considered a candidate here at [name].  We could pursue evaluation at another transplant [name] should [name] request this.      No follow-ups on file.    The above plan was discussed with the patient and he is in agreement.       Subjective:      HPI: Jordan [name] is a 64 y.o. male who is seen in a video telehealth visit for follow up of bronchiectasis.    09/27/18:  I last saw him on 05/17/18. He reached out at the end of May reporting symptoms c/w an exacerbation. I treated him with a two week course of Ciprofloxacin, which seemed to help.  Reports that he feels winded too much - if walks up incline or up a couple hundred feet. This has worsened over the past year. Has to stop and let lungs catch up.  Of note, he has been off testosterone supplementation for the past year after he was diagnosed with prostate cancer.    He received the [name], which he is using with nebulized HTS 7% and Pulmozyme. After  using the Vest for 6-8 weeks, he noticed improvement with more phlegm mobilization. Sometimes has to pause it to cough to get stuff up.  Plugging feeling happening a lot more, especially with the vest.  Most of the time, sputum is thinned out with Pulmozyme but still gets those plugs.    Had to stop eating ice cream or drinking milkshake - will cough will eating and that makes him vomit on occasion. Past few weeks, when eating will cough, no matter what he eats.  Happens almost once a day. Doesn't feel like things get stuck typically.      02/07/19:  Following his last visit, Jordan [name] reached out regarding portable oxygen.  We had considered the life 2000 but he does not qualify because he does not use supplemental oxygen.  He performed the 6-minute walk test prior to today's visit but did not desaturate.  He wonders if this is because the test was done in the morning and he doesn't have breathing issues in the morning.  Typically, breathing issues occur in afternoon.  During these episodes, he will have to use his inhaler and feels like he is suffocating.  He describes it as an uncomfortable feeling that limits his ability to do things with family.  If he is having trouble catching breath, oximetry is 92-93%. Has seen it in the 80s if comes in from outside and is short of breath.    For airway clearance, he is nebulizing 10% hypertonic saline and using his percussive vest for 20 minute sessions in AM and PM. Has to pause it to cough and get stuff up.  Coughing up more stuff since last visit and is waking up 1-2 times a night with cough.having coughing fits between clearance session.  What he expectorates is less of a mucous plug and mostly stringy sputum that is yellowish to light brown in color.  No hemoptysis.  On Saturday, he had a coughing fit that nearly triggered posttussive emesis.  He only expectorated about 3-4 globs of phlegm.  Nebulizing Pulmozyme daily.  Alternates [name] and colistin every other month.  On [name] this month and will start Colistin on Dec 1.    06/06/19:  Coughing more and more sputum.  Waking up 1-2 times per night coughing. Can cough once or just keep coughing.  Sputum is yellow in color without evidence of blood.  He is not expectorating green sputum currently. On colistin this month; alternates with [name]. Went through January off all inhaled antibiotics.  Oxygen levels in the low 90s.  Uses rescue inhaler if goes up stairs.  Regularly performing airway clearance with nebulized 10% hypertonic saline twice a day, percussive vest twice a day, and Pulmozyme daily. He last received IV antibiotics in January 2019 but has been treated with oral antibiotics on 04/19/18 and 08/17/18, 03/10/19.      11/10/19:  1-2 times per week can get sharp right sided chest pain - even with nipple line. Cna happen with clearance or acitivty or rest. Never during sleep. Since hospitalization. June first time. Lasts only about 10 seconds. Not reproducible.    Past Medical History:   Diagnosis Date   ??? Abscess of lung (CMS-HCC) 11/06/2010    CT Chest 10/22/10 12/05/2010 right thoracotomy resection of right middle lobe and resection of right lower lobe abscess    ??? Arthritis     back   ??? Biceps tendon tear 2013    left side   ??? Bronchiectasis (CMS-HCC)  chronic psuedomonas infection   ??? Degenerative joint disease of left acromioclavicular joint    ??? GERD (gastroesophageal reflux disease)    ??? Hypertension    ??? Obstructive sleep apnea on CPAP 03/14/13    AHI 33.5, on BiPAP 12/8 based on sleep study 09/2013   ??? Pneumonia 2012    Lung abcess    ??? PONV (postoperative nausea and vomiting)    ??? Prostate cancer (CMS-HCC) 09/09/2017   ??? Rotator cuff injury left   ??? Vertigo        Past Surgical History:   Procedure Laterality Date   ??? APPENDECTOMY  1969   ??? BACK SURGERY     ??? BRONCHOSCOPY  10/19/2010    [facility]   ??? CARPAL TUNNEL RELEASE  1999   ??? LUNG REMOVAL, PARTIAL Right 10/2010    RML and RLL partially resected   ??? MENISCECTOMY  2011   ??? NECK SURGERY  1993, 1999, 2002   ??? PR GERD TST W/ MUCOS IMPEDE ELECTROD,>1HR N/A 07/06/2012    Procedure: ESOPHAGEAL FUNCTION TEST, GASTROESOPHAGEAL REFLUX TEST W/ NASAL CATHETER INTRALUMINAL IMPEDANCE ELECTRODE(S) PLACEMENT, RECORDING, ANALYSIS AND INTERPRETATION; PROLONGED;  Surgeon: None None;  [facility];  Service: Gastroenterology   ??? PR OPEN TREAT RIB FRACTURE W/INT FIXATION, UNILATERAL, 1-2 RIBS Right 03/19/2015    Procedure: OPEN TREATMENT OF RIB FRACTURE REQUIRING INTERNAL FIXATION, UNILATERAL; 1-2 RIBS;  Surgeon: Evert Kohl, MD;  Location: MAIN OR [name];  Service: Cardiothoracic   ??? PR THORACOTOMY W/THERAP WEDGE RESEXN ADDL IPSILATRL Right 08/07/2013    Procedure: THORACOTOMY; WITH THERAPEUTIC LOBECTOMY OF RIGHT MIDDLE AND RIGHT LOWER LOBE RESECTION , EACH ADDITIONAL RESECTION, IPSILATERAL;  Surgeon: Alvester Chou, MD;  Location: MAIN OR [name];  Service: Cardiothoracic   ??? TONSILLECTOMY  1965       Family History   Problem Relation Age of Onset   ??? Alzheimer's disease Mother    ??? Stroke Mother    ??? Bronchiectasis  Brother         presumed   ??? Bronchiectasis  Brother         presumed   ??? Liver disease Father    ??? Diabetes Father    ??? Heart failure Father    ??? Bronchiectasis  Daughter    ??? Asthma Son         childhood asthma   ??? Clotting disorder Neg Hx    ??? Anesthesia problems Neg Hx        Social History     Tobacco Use   ??? Smoking status: Never Smoker   ??? Smokeless tobacco: Never Used   Substance Use Topics   ??? Alcohol use: No     Alcohol/week: 0.0 standard drinks   ??? Drug use: No       Allergies  Reviewed at 11:33 AM      Reactions Comment    Gentamicin Other (See Comments) BALANCE ISSUES    Tobramycin Other (See Comments) ototoxicity    Morphine Nausea And Vomiting         Current Outpatient Medications   Medication Sig Dispense Refill   ??? albuterol 2.5 mg /3 mL (0.083 %) nebulizer solution Inhale 2.5 mg by nebulization Two (2) times a day.      ??? albuterol HFA 90 mcg/actuation inhaler INHALE 2 PUFFS INTO LUNGS EVERY 4 HOURS AS NEEDED FOR WHEEZING OR SHORTNESS OF BREATH. 6.7 g 3   ??? azithromycin (ZITHROMAX) 250 MG tablet TAKE 1  TABLET BY MOUTH EVERY DAY 30 tablet 11   ??? colistimethate (COLYMYCIN) 150 mg injection Inject 2mL sterile water for injection to mix colistin vial, then draw up 2mL (150mg ) and inhale 2 times a day,28 days on and 28 days off. 60 each 5   ??? dornase alfa (PULMOZYME) 1 mg/mL nebulizer solution Inhale 2.5 mg daily. Use at least 30-60 minutes before airway clearance, or after airway clearance 225 mL 3   ??? HYDROcodone-acetaminophen (NORCO) 7.5-325 mg per tablet Take 1 tablet by mouth three (3) times a day (at 6am, noon and 6pm).     ??? montelukast (SINGULAIR) 10 mg tablet Take 1 tablet (10 mg total) by mouth nightly. 90 tablet 3   ??? nebulizers (LC PLUS) Misc use with Colistin 1 each 5   ??? omeprazole (PRILOSEC) 20 MG capsule Take 20 mg by mouth Two (2) times a day.      ??? sodium chloride 10 % Nebu Inhale 5 mL by nebulization Two (2) times a day. 900 mL 3   ??? SYMBICORT 160-4.5 mcg/actuation inhaler INHALE 2 PUFFS INTO THE LUNGS 2 TIMES A DAY. 30.6 g 1   ??? tobramycin, PF, ([name]) 300 mg/5 mL nebulizer solution Inhale 5 mL (300 mg total) by nebulization every twelve (12) hours. 28 days on and 28 days off. 280 mL 5   ??? alcohol swabs (ALCOHOL PREP PADS) PadM Use as directed with inhaled antibiotics 100 each 5   ??? ascorbic acid, vitamin C, (VITAMIN C) 1000 MG tablet Take 1,000 mg by mouth daily.     ??? ferrous sulfate (IRON, FERROUS SULFATE,) 325 (65 FE) MG tablet Take 325 mg by mouth Two (2) times a day. BID     ??? HYDROcodone-homatropine (HYCODAN) 5-1.5 mg/5 mL syrup Take 5 mL by mouth every four (4) hours as needed for cough. 120 mL 0   ??? multivitamin-minerals-lutein Tab Take 1 tablet by mouth daily.      ??? nebulizers (LC PLUS) Misc Use as directed with hypertonic saline 10% 3 each 3   ??? sterile water, PF, Soln Use 2mL to mix Colistin,then add additional 2mL to neb cup with 2mL of mixed colistin.Inhale twice daily 28 days on and 28 days off. 600 mL 5   ??? syringe with needle (BD LUER-LOK SYRINGE) 3 mL 20 gauge x 1 1/2 Syrg For use with inhaled antibiotic (Colistin) 60 each 30   ??? syringe with needle (BD LUER-LOK SYRINGE) 3 mL 20 gauge x 1 Syrg For use with inhaled antibiotic (Colistin) 60 each 5     No current facility-administered medications for this visit.     Physical Exam:   BP 116/64 (BP Site: L Arm, BP Position: Sitting, BP Cuff Size: Medium)  - Pulse 95  - Temp 36.4 ??C (97.6 ??F) (Temporal)  - Wt 80.7 kg (178 lb)  - SpO2 91%  - BMI 26.67 kg/m??   Well appearing white male in no acute distress, in good spirits. Easy work of breathing without accessory muscle use. Speaking in full sentences.  Crackles appreciated over left base.  Decreased breath sounds over right base, consistent with prior resection.  No audible wheezing.  Regular rate and rhythm with normal S1 and S2.  No murmurs, rubs, or gallops.  Clubbing present and unchanged from prior.  No lower extremity edema.    Diagnostic Review:   The following data were reviewed during this visit with key findings summarized below:    Pulmonary Function Testing:  Spirometry obtained today consistent  with severe airway obstruction and moderate restriction.  FVC has significantly improved from 01/26/19.   FVC (% predicted) FEV1 (% predicted) FEV1/FVC   11/10/2013 3.19 L (68%) 2.16 L (61%)  68%   02/23/2014 3.50 L (76%)  2.23 L (64%)  64%   07/12/2014  2.94 L (66%)  2.02 L (58%)  69%   01/15/2015  2.82 L (62%)  1.79 L (52%)  63%   02/08/2015  3.04 L (66%)  2.06 L (59%)  68%   04/02/2015  2.86 L (63%)  1.83 L (53%)  64%   12/10/2015  2.77 L (62%)  1.62 L (48%)  58%   01/27/2016  2.42 L (54%)  1.55 L (46%)  64%   03/24/2016  3.36 L (75%)  1.99 L (59%)  59%   07/21/2016  2.56 L (57%)  1.54 L (46%)  60%   12/29/2016  2.80 L (63%)  1.57 L (47%)  56%   04/06/2017  2.61 L (59%)  1.46 L (44%)  56%   07/06/2017  2.56 L (58%)  1.49 L (45%)  58%   10/12/2017  2.63 L (59%)  1.47 L (44%)  56%   01/18/2018  2.68 L (63%)  1.36 L (42%)  51%   05/17/2018 2.49 L (57%)  1.25 L (38%)  50%   09/26/2018 2.43 L (57%)  1.26 L (39%)  52%   01/26/2019 2.16 L (51%)  1.16 L (36%)  54%   06/06/2019 2.47 L (59%) 1.11 L (34%) 45%   11/10/19 2.14 L (50%) 1.20 L (37%) 56%     6-Minute Walk (02/15/19): Resting Heart Rate was 91. Maximum Heart Rate achieved 119. Patient did reach target heart rate (86-133). Resting O2 Saturation was 98% on RA. Resting BORG score was 1. O2 saturation nadir was 91% at minute 6. Total 6 minute walk distance was 381 meters. Predicted 6 minute walk distance of 558 m (LLN of 460m). Percent of predicted 6 minute walk distance of 68%. Heart rate returned to 96 bpm after 2 minutes of recovery. O2 saturation returned to 96% on RA after 2 minutes of recovery. Maximum BORG score was 7 at minute 6. Concluding BORG score was 2 after 2 minutes of recovery. No evidence of resting or exercise induced hypoxemia. No supplemental oxygen is required at this time.    6-Minute Walk (01/26/19): Resting Heart Rate was 86. Maximum Heart Rate achieved 97. Patient did reach target heart rate (86-133). Resting O2 Saturation was 99% on RA. Resting BORG score was 1. O2 saturation nadir was 95% at minutes 3 and 6. Total 6 minute walk distance was 350 meters. Predicted 6 minute walk distance of 558 m (LLN of 458m). Percent of predicted 6 minute walk distance of 63%. Heart rate returned to 86 bpm after 2 minutes of recovery. O2 saturation returned to 96% on RA after 2 minutes of recovery. Maximum BORG score was 6 at minutes 5 and 6. Concluding BORG score was 1 after 2 minutes of recovery. No evidence of resting or exercise induced hypoxemia. No supplemental oxygen is required at this time.    Cultures:  ?? Source Bacterial Culture AFB Smear AFB Culture   04/02/15 Sputum 2+ OPF; 1+ Probably Mucoid PsA Negative Negative   07/02/15 Sputum 4+ OPF; 3+ Mucoid PsA; 3+ Smooth PsA; 1+ Steno Negative Negative   01/23/16 Sputum 2+ OPF; 1+ Mucoid PsA; 1+ Smooth PsA - -   03/24/16 Sputum 3+ OPF; 3+ Mucoid PsA; 3+ Smooth PsA Negative  Overgrown   07/21/16 Sputum 4+ OPF; 4+ Mucoid PsA; 2+ Smooth PsA Negative Negative   12/29/16 Sputum 4+ OPF; 3+ Mucoid PsA; 3+ Smooth PsA Negative Actinomadura sp   04/06/17 Sputum 3+ OPF; 4+ Mucoid PsA Negative Negative   07/06/17 Sputum 4+ OPF; 3+ Mucoid PsA Negative Negative   10/12/17 Sputum 4+ OPF; 3+ Mucoid PsA Negative Negative   01/18/18 Sputum OPF Negative Negative   05/17/18 Sputum 4+ OPF; 3+ Mucoid PsA - -   09/26/18 Sputum 4+ OPF; 3+ Mucoid PsA Negative Negative   01/26/19 Sputum 4+ OPF; 2+ Mucoid PsA Negative Negative   02/15/19 Sputum 3+ OPF; 3+ Mucoid PsA Negative Negative   06/06/19 Sputum 3+     ??    Bronchiectasis Evaluation   IgG with subclasses:   Lab Results   Component Value Date/Time    IGGT 996 04/28/2012 06:21 AM    IGG1 402 04/28/2012 06:21 AM    IGG2 399 04/28/2012 06:21 AM    IGG3 148.0 (H) 04/28/2012 06:21 AM    IGG4 47.7 04/28/2012 06:21 AM     IgA (09/21/11): 283   IgM (09/21/11): 52   IgE:   Lab Results   Component Value Date/Time    IGE 10.5 01/22/2016 09:53 PM     Specific Titers: No results found for: TETANUSAB, TETGV, DIPHTERIAAB, DIPGV, S. pneumonia titers  HIV: No results found for: HIV  Autoimmune: No results found for: ANA, ANATITER1, PAT1, ANATITER2, PAT2, DSDNAAB, ENAS, RF, CCPAB, CCPIGG  ANCA: No results found for: ANCA, IFA, PR3ELISA, PR3QT, MPO, MPOQT  CBC-D:   Lab Results   Component Value Date/Time    WBC 5.4 04/19/2017 05:45 AM    WBC 7.9 08/13/2013 03:57 AM    HGB 14.1 04/19/2017 05:45 AM    HGB 14.8 03/24/2015 05:31 PM    HCT 43.9 04/19/2017 05:45 AM    HCT 30.8 (L) 08/13/2013 03:57 AM    PLT 140 (L) 04/19/2017 05:45 AM    PLT 292 08/13/2013 03:57 AM    NEUTROABS 3.2 04/19/2017 05:45 AM    NEUTROABS 4.9 06/21/2013 09:31 PM    LYMPHSABS 1.4 (L) 04/19/2017 05:45 AM    LYMPHSABS 1.9 06/21/2013 09:31 PM    EOSABS 0.2 04/19/2017 05:45 AM    EOSABS 0.2 06/21/2013 09:31 PM    BASOSABS 0.0 04/19/2017 05:45 AM    BASOSABS 0.0 06/21/2013 09:31 PM     Alpha-1 level: No results found for: A1TRYP  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    05/23/18:               Imaging  Chest CT (02/15/19): Images personally reviewed. Stable sequelae of right middle lobectomy and right lower lobe wedge resection.  Unchanged diffuse extensive bronchiectasis and bronchial wall thickening bilaterally.  Diffusely scattered tree-in-bud nodules in bilateral lung parenchyma with interval increase in tree-in-bud nodules in left lung; the constellation of these findings are consistent with sequelae of worsening chronic endobronchial infection.    Chest CT (03/31/16): Images personally reviewed. Status post right middle and right lower lobectomies. Tiny loculated basilar right hydropneumothorax, probably chronic. Moderate cylindrical and varicoid bronchiectasis throughout both lungs, with  associated scattered mucoid impaction, tree-in-bud opacities and scattered peribronchovascular and subpleural consolidation in the right upper lobe. 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 Two-vessel coronary atherosclerosis. Nonobstructing right nephrolithiasis.    Modified Barium Swallow (01/17/19): Shallow laryngeal penetration observed with thin liquids and puree.  No aspiration identified with any of the tested consistencies.    Immunization History   Administered Date(s) Administered   ??? COVID-19 VACC,MRNA,(PFIZER)(PF)(IM) 04/20/2019, 05/18/2019   ??? INFLUENZA TIV (TRI) 97MO+ W/ PRESERV (IM) 11/13/2010, 01/07/2012   ??? INFLUENZA TIV (TRI) PF (IM) 02/20/2012, 12/09/2012   ??? Influenza Vaccine Quad (IIV4 PF) 54mo+ injectable 01/31/2016, 12/09/2017   ??? Influenza Virus Vaccine, unspecified formulation 12/25/2014, 12/01/2016   ??? Influenza Whole 12/21/2008   ??? PNEUMOCOCCAL POLYSACCHARIDE 23 10/22/2010, 02/07/2016   ??? Pneumococcal Conjugate 13-Valent 04/28/2013   ??? TdaP 03/22/2012     Portions of this record have been created using [employer]. Dictation errors have been sought, but may not have been identified and corrected.    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 Two-vessel coronary atherosclerosis. Nonobstructing right nephrolithiasis.    Modified Barium Swallow (01/17/19): Shallow laryngeal penetration observed with thin liquids and puree.  No aspiration identified with any of the tested consistencies.    Immunization History   Administered Date(s) Administered   ??? COVID-19 VACC,MRNA,(PFIZER)(PF)(IM) 04/20/2019, 05/18/2019   ??? INFLUENZA TIV (TRI) 97MO+ W/ PRESERV (IM) 11/13/2010, 01/07/2012   ??? INFLUENZA TIV (TRI) PF (IM) 02/20/2012, 12/09/2012   ??? Influenza Vaccine Quad (IIV4 PF) 22mo+ injectable 01/31/2016, 12/09/2017   ??? Influenza Virus Vaccine, unspecified formulation 12/25/2014, 12/01/2016   ??? Influenza Whole 12/21/2008   ??? PNEUMOCOCCAL POLYSACCHARIDE 23 10/22/2010, 02/07/2016   ??? Pneumococcal Conjugate 13-Valent 04/28/2013   ??? TdaP 03/22/2012     Portions of this record have been created using [employer]. Dictation errors have been sought, but may not have been identified and corrected.    Viona Gilmore, MD  11/10/19  11:57 AM

## 2019-11-10 NOTE — Unmapped (Signed)
[facility] Pharmacist Note     Purpose: Medication Consult  ??? Dr. Garner Nash and patient requested to see CPP today due to concerns over access of nebulized colistin when he switches insurances come 11/22/2019.   ??? Per [name], he has a delivery of colistin coming today from [facility] as well as inhaled tobramycin from [facility].  ??? We reviewed the Medicare Advantage Formulary book he brought with him and the colistin vials are on formulary as Tier 4, with flags that note B vs D and NMO (no mail order).    Recommendations:  ?? Advised for patient to reach out to new plan and ask about if [facility] is a classified as a mail order or retail pharmacy under his new plan.     Electronically signed:  Anell Barr, PharmD, BCPS, CPP  Clinical Pharmacist Practitioner  [facility]

## 2019-11-10 NOTE — Unmapped (Unsigned)
Sputum sample collected. Sample sent to Lab for AFB and Lower resp.    Gave pt unofficial walk test.   Pt started at 92% spo2- dropped to 87% spo2 at 2 min mark. stopoped test and took pt back to room.

## 2019-11-10 NOTE — Unmapped (Unsigned)
Sent AFB and lower respiratory sample to lab via tube station.

## 2019-11-15 NOTE — Unmapped (Signed)
Faxed order for overnight pulse oximetry to [facility].  Patient notified via My Chart.

## 2019-11-22 DIAGNOSIS — J479 Bronchiectasis, uncomplicated: Principal | ICD-10-CM

## 2019-11-22 NOTE — Unmapped (Signed)
Faxed oxygen order to [facility] for overnight oxygen.  Responded to patient's My Chart message.

## 2019-11-29 DIAGNOSIS — G4719 Other hypersomnia: Secondary | ICD-10-CM | POA: Diagnosis not present

## 2019-12-06 ENCOUNTER — Encounter: Admit: 2019-12-06 | Discharge: 2019-12-07 | Payer: MEDICARE

## 2019-12-06 DIAGNOSIS — R0902 Hypoxemia: Secondary | ICD-10-CM | POA: Diagnosis not present

## 2019-12-06 DIAGNOSIS — J479 Bronchiectasis, uncomplicated: Secondary | ICD-10-CM | POA: Diagnosis not present

## 2019-12-06 NOTE — Unmapped (Signed)
Faxed prescription and 6 minute walk to [facility] for portable oxygen.  Patient notified via My Chart

## 2019-12-12 DIAGNOSIS — J479 Bronchiectasis, uncomplicated: Principal | ICD-10-CM

## 2019-12-12 DIAGNOSIS — A498 Other bacterial infections of unspecified site: Principal | ICD-10-CM

## 2019-12-12 NOTE — Unmapped (Signed)
[facility] Pharmacist Intervention    Type of intervention: Medication Accress     Medication: Colistin 150mg     Problem: Patient has new insurance starting 11/22/2019 and is asking about colistin coverage with the new plan.      Intervention: Transferred patient to Order Entry to add insurance and talk though Restaurant manager, fast food.  OE will submit referral if needed, otherwise I will call next month to schedule refill as needed.    Follow up needed: Mid-October for colistin refill to prep for Nov 1 start of therapy.      Approximate time spent: 10 minutes    Julianne Rice   [facility] Specialty Pharmacist

## 2019-12-18 DIAGNOSIS — G4733 Obstructive sleep apnea (adult) (pediatric): Secondary | ICD-10-CM | POA: Diagnosis not present

## 2019-12-18 NOTE — Unmapped (Signed)
Faxed signed order and accompanying request demographics/insurance form to [facility].     Fax: [phone].     Fax confirmation received.

## 2019-12-19 DIAGNOSIS — Z2239 Carrier of other specified bacterial diseases: Principal | ICD-10-CM

## 2019-12-19 DIAGNOSIS — J479 Bronchiectasis, uncomplicated: Principal | ICD-10-CM

## 2019-12-19 MED ORDER — TOBRAMYCIN 300 MG/5 ML IN 0.225 % SODIUM CHLORIDE FOR NEBULIZATION
Freq: Two times a day (BID) | RESPIRATORY_TRACT | 5 refills | 28 days | Status: CP
Start: 2019-12-19 — End: 2020-12-18
  Filled 2020-02-19: qty 280, 56d supply, fill #0

## 2019-12-19 MED ORDER — NEBULIZERS
5 refills | 0 days
Start: 2019-12-19 — End: ?

## 2019-12-19 NOTE — Unmapped (Signed)
[facility] Pharmacist Note     Purpose: Medication Management    Medication orders below were sent for Jordan [name].  1. Bronchiectasis without complication (CMS-HCC)  - tobramycin, PF, ([name]) 300 mg/5 mL nebulizer solution; Inhale 5 mL (300 mg total) by nebulization every twelve (12) hours. 28 days on and 28 days off.  Dispense: 280 mL; Refill: 5  Pharmacy sent to: [facility]      Anell Barr, PharmD, BCPS, CPP  Clinical Pharmacist Practitioner  [facility]

## 2019-12-19 NOTE — Unmapped (Signed)
[facility] Pharmacist Intervention    Type of intervention: Medication Access/Prices    Medication: Colistin 150mg  and [name] 300mg /39ml    Problem: Patient calling to inquire about pricing of colistin with his new insurance which was active 11/22/2019.  Patient states he received an call from his insurance company stating they would not approve the tier exception and the cost would be $90 per fill.  Patient is uncomfortable with this price and is also concerned with price of [name] nebs which he believes he will have to pay a percentage of the full cost.  He is unable to get in contact with someone at [facility] to find out what tobi nebs would cost with new insurance and is having anxiety about his ability to continue to afford medications.      Intervention: Requested RX for [name] 300mg /67ml to come to [facility] so we can test claim and look for assistance.  I also informed him of our payment plan with payment of $10/month to afford medications that he needs.     Follow up needed: I will follow-up with patient with final costs once [employer] has completed benefits investigation for Colistin and [name].     Approximate time spent: 15 minutes    Julianne Rice   [facility] Specialty Pharmacist

## 2019-12-20 DIAGNOSIS — J479 Bronchiectasis, uncomplicated: Principal | ICD-10-CM

## 2019-12-20 NOTE — Unmapped (Signed)
[facility] Specialty Medication Onboarding    Specialty Medication: Colistimethate  Prior Authorization: Approved   Financial Assistance: No - copay card or gant not available   Final Copay/Day Supply: $90 / 28 days    Insurance Restrictions: Yes - max 1 month supply     Notes to Pharmacist: Not a true onboarding, but wanted to send to reflect the no copay assistance available for this medication for the patient.     The triage team has completed the benefits investigation and has determined that the patient is able to fill this medication at [facility]. Please contact the patient to complete the onboarding or follow up with the prescribing physician as needed.

## 2019-12-21 DIAGNOSIS — J3089 Other allergic rhinitis: Principal | ICD-10-CM

## 2019-12-21 DIAGNOSIS — J479 Bronchiectasis, uncomplicated: Principal | ICD-10-CM

## 2019-12-21 MED ORDER — ALBUTEROL SULFATE 2.5 MG/3 ML (0.083 %) SOLUTION FOR NEBULIZATION
Freq: Two times a day (BID) | RESPIRATORY_TRACT | 3 refills | 90.00000 days | Status: CP
Start: 2019-12-21 — End: 2020-12-20

## 2019-12-21 MED ORDER — ALBUTEROL SULFATE HFA 90 MCG/ACTUATION AEROSOL INHALER
Freq: Four times a day (QID) | RESPIRATORY_TRACT | 3 refills | 0.00000 days | Status: CP
Start: 2019-12-21 — End: 2020-12-20

## 2019-12-21 MED ORDER — MONTELUKAST 10 MG TABLET
ORAL_TABLET | Freq: Every evening | ORAL | 3 refills | 90.00000 days | Status: CP
Start: 2019-12-21 — End: 2020-12-20

## 2019-12-21 MED ORDER — AZITHROMYCIN 250 MG TABLET
ORAL_TABLET | Freq: Every day | ORAL | 3 refills | 90.00000 days | Status: CP
Start: 2019-12-21 — End: 2020-12-20

## 2019-12-21 NOTE — Unmapped (Signed)
Received MyChart request from [name].       [facility] [address] in [place] for the following prescriptions please;   ???.SYMBICORT inhaler,   ???Albuterol inhaler, it has to be the ??? [employer] Generic, as that is the only one that my insurance covers,   ???AZITHROMYCIN,   ???Albuterol 2.5 mg / 3 mil (0.083%) nebulizer solution,   ???Montelukast 10mg  tablet.     These medications have been sent to preferred pharmacy and will notify patient via MyChart message.

## 2019-12-21 NOTE — Unmapped (Signed)
[facility] Specialty Medication Onboarding    Specialty Medication: Tobramycin (PF) 300mg /64ml inhaled solution   Prior Authorization: Not Required   Financial Assistance: No - patient doesn't qualify for additional assistance   Final Copay/Day Supply: $549.95 / 28 days    Insurance Restrictions: Yes - max 1 month supply     Notes to Pharmacist:     The triage team has completed the benefits investigation and has determined that the patient is able to fill this medication at [facility]. Please contact the patient to complete the onboarding or follow up with the prescribing physician as needed.

## 2019-12-23 DIAGNOSIS — J479 Bronchiectasis, uncomplicated: Secondary | ICD-10-CM | POA: Diagnosis not present

## 2019-12-23 DIAGNOSIS — J471 Bronchiectasis with (acute) exacerbation: Secondary | ICD-10-CM | POA: Diagnosis not present

## 2019-12-23 DIAGNOSIS — G4733 Obstructive sleep apnea (adult) (pediatric): Secondary | ICD-10-CM | POA: Diagnosis not present

## 2019-12-25 NOTE — Unmapped (Signed)
[facility] Pharmacist Intervention    Type of intervention: Medication Access    Medication: Tobramycin/Colistin    Problem: Explained the price of Tobramycin $550/cycle and Colistin $90/cycle and that there were no other options available for assistance.  Reiterated that we can put these on a charge account and patient would only have to pay $10/month.     Intervention: Patient will touch base with provider and think about next steps    Follow up needed: 2 weeks to see how patient wants to continue. He will call me before then if he would like medications filled.     Approximate time spent: 10 minutes    Julianne Rice   [facility] Specialty Pharmacist

## 2019-12-29 DIAGNOSIS — R059 Cough, unspecified: Secondary | ICD-10-CM | POA: Diagnosis not present

## 2020-01-02 NOTE — Unmapped (Signed)
Attempted to reach [name].     There was no answer. Left him voicemail for a call back.

## 2020-01-08 ENCOUNTER — Other Ambulatory Visit: Payer: Self-pay

## 2020-01-08 ENCOUNTER — Encounter (HOSPITAL_COMMUNITY)
Admission: RE | Admit: 2020-01-08 | Discharge: 2020-01-08 | Disposition: A | Discharge status: Home / Self Care | Payer: PPO | Source: Ambulatory Visit | Attending: Cardiology | Admitting: Cardiology

## 2020-01-08 ENCOUNTER — Encounter (HOSPITAL_COMMUNITY): Payer: Self-pay

## 2020-01-08 VITALS — BP 118/62 | HR 70 | Resp 20 | Ht 68.0 in

## 2020-01-08 DIAGNOSIS — J479 Bronchiectasis, uncomplicated: Secondary | ICD-10-CM | POA: Insufficient documentation

## 2020-01-08 NOTE — Progress Notes (Signed)
[name] 65 y.o. male Pulmonary Rehab Orientation Note [name] who was referred to Pulmonary rehab by Dr. Joneen Caraway with the diagnosis of Bronchiectasis arrived today in Cardiac and Pulmonary Rehab. He arrived ambulator with steady gait  . He does not carry portable oxygen. However he is suppose to use oxygen therapy on exertion/active.  Pt oxygen saturation checked 95% on RA.   [facility] is the provider for their MDE. Per pt, he uses oxygen at night when going to sleep, with exercise - long distance. Color good, skin warm and dry. Patient is oriented to time and place. Patient's medical history, psychosocial health, and medications reviewed. Psychosocial assessment reveals pt lives with his spouse. Pt is currently retired Chief Financial Officer. Pt hobbies include spending time with his grandchildren ages 31-13, all live in the area. Pt reports his stress level is non existent. Pt cites the normal everyday stress - manages with no difficulty  Pt does not exhibit signs of depression.  PHQ2/9 score 0/3. Pt shows good  coping skills with positive outlook .  Will continue to monitor and evaluate progress toward psychosocial goal(s) of  Feeling better with more energy and stamina.  Pt was told at his last pulmonary clinic visit that his lung function had decrease and any further decrease would need to consider a lung transplant.  Pt is hopeful that the rehab program will help with maintain his pulmonary function. Physical assessment reveals heart rate is normal, breath sounds clear to auscultation, no wheezes, rales, or rhonchi, however diminished lower bases. Grip strength equal, strong. Distal pulses palpable with no swelling noted. Patient reports he does take medications as prescribed. Patient states he follows a low carbohydrate diet.  Pt with no desire to gain or lose weight. Pt would like to continue to maintain his current weight. Patient's weight will be monitored closely. Demonstration and practice of PLB  using pulse oximeter. Patient able to return demonstration satisfactorily. Safety and hand hygiene in the exercise area reviewed with patient. Patient voices understanding of the information reviewed. Department expectations discussed with patient and achievable goals were set. The patient shows enthusiasm about attending the program and we look forward to working with this nice gentleman. The patient completed a 6 min walk test today.  Pt will begin exercise on 10/26 at 2:00.  Pt is looking forward to participating in pulmonary rehab.  45 minutes was spent on a variety of activities such as assessment of the patient, obtaining baseline data including height, weight, BMI, and grip strength, verifying medical history, allergies, and current medications, and teaching patient strategies for performing tasks with less respiratory effort with emphasis on pursed lip breathing. Cherre Huger, BSN Cardiac and Training and development officer

## 2020-01-08 NOTE — Progress Notes (Signed)
Pulmonary Individual Treatment Plan  Patient Details  Name: [name] MRN: [id number] Date of Birth: 1955/04/27 Referring Provider:    Initial Encounter Date:   Visit Diagnosis: Bronchiectasis without acute exacerbation ([place])  Patient's Home Medications on Admission:   Current Outpatient Medications:  .  acidophilus (RISAQUAD) CAPS capsule, Take 2 capsules by mouth daily., Disp: 60 capsule, Rfl: 0 .  albuterol (PROVENTIL HFA;VENTOLIN HFA) 108 (90 Base) MCG/ACT inhaler, Inhale 2 puffs into the lungs every 4 (four) hours as needed for wheezing or shortness of breath. , Disp: , Rfl:  .  albuterol (PROVENTIL) (2.5 MG/3ML) 0.083% nebulizer solution, Inhale 2.5 mg into the lungs 4 (four) times daily. , Disp: , Rfl:  .  ascorbic acid (VITAMIN C) 250 MG CHEW, Chew 1,000 mg by mouth daily. , Disp: , Rfl:  .  azithromycin (ZITHROMAX) 250 MG tablet, Take 1 tablet (250 mg total) by mouth daily., Disp: , Rfl: 0 .  budesonide-formoterol (SYMBICORT) 160-4.5 MCG/ACT inhaler, Inhale 2 puffs into the lungs 2 (two) times daily., Disp: , Rfl:  .  Cholecalciferol 25 MCG (1000 UT) tablet, Take 1,000 Units by mouth daily., Disp: , Rfl:  .  Colistimethate Sodium POWD, Take 2 mLs by nebulization 2 (two) times daily. Use only every other month. 150mg  injection., Disp: , Rfl:  .  dornase alpha (PULMOZYME) 1 MG/ML nebulizer solution, Take 2.5 mg by nebulization daily. Use at least 30-60 minutes before airway clearance or after airway clearance, Disp: , Rfl:  .  doxylamine, Sleep, (UNISOM) 25 MG tablet, Take 25 mg by mouth at bedtime., Disp: , Rfl:  .  ferrous sulfate 325 (65 FE) MG tablet, Take 325 mg by mouth 2 (two) times daily., Disp: , Rfl:  .  fluticasone (FLONASE) 50 MCG/ACT nasal spray, Place 2 sprays into both nostrils daily., Disp: , Rfl:  .  HYDROcodone-acetaminophen (NORCO) 7.5-325 MG tablet, Take 1 tablet by mouth every 6 (six) hours as needed for moderate pain. (Patient taking differently: Take 1  tablet by mouth 3 (three) times daily. ), Disp: 20 tablet, Rfl: 0 .  HYDROcodone-homatropine (HYCODAN) 5-1.5 MG/5ML syrup, Take 5 mLs by mouth every 6 (six) hours as needed for cough. (Patient taking differently: Take 5 mLs by mouth every 4 (four) hours as needed for cough. ), Disp: 240 mL, Rfl: 0 .  montelukast (SINGULAIR) 10 MG tablet, Take 10 mg by mouth at bedtime., Disp: , Rfl:  .  Multiple Vitamin (MULTIVITAMIN WITH MINERALS) TABS tablet, Take 1 tablet by mouth daily., Disp: , Rfl:  .  NON FORMULARY, Use Acapella twice daily for airway clearance. Dx. 494.0. Mucous clearing device., Disp: , Rfl:  .  omeprazole (PRILOSEC) 20 MG capsule, Take 20 mg by mouth 2 (two) times daily. , Disp: , Rfl:  .  Respiratory Therapy Supplies (FLUTTER) DEVI, Use several times daily as needed for congestion/thick mucus, Disp: , Rfl:  .  Sodium Chloride 10 % NEBU, Inhale 5 mLs into the lungs 2 (two) times daily., Disp: , Rfl:  .  tobramycin, PF, ([name]) 300 MG/5ML nebulizer solution, Take 300 mg by nebulization 2 (two) times daily. Use only every other month, Disp: , Rfl:  .  Water For Irrigation, Sterile (STERILE WATER FOR IRRIGATION), 4 mLs daily. Mix 62ml with colistimethate powder to use for nebulization. 28 days on and 28 days off., Disp: , Rfl:   Past Medical History: Past Medical History:  Diagnosis Date  . Bronchiectasis ([place])   . Bronchiolitis   . Dizziness  and giddiness 07/26/2012   none recent  . DJD (degenerative joint disease)    back and neck  . Dyspnea    with walking long distances  . Gastroesophageal reflux disease   . History of kidney stones yrs ago   passed on own  . HTN (hypertension)   . OSA (obstructive sleep apnea)    none since 30 lb weight loss  . Pneumonia last time 2017  . Pneumonia with lung abscess ([place])   . PONV (postoperative nausea and vomiting)    likes scopolamine patch, trouble turning neck to left  . Prostate cancer ([place]) dx Aug 12, 2017  . Tear of left rotator cuff     healed on own    Tobacco Use: Social History   Tobacco Use  Smoking Status Never Smoker  Smokeless Tobacco Never Used    Labs: Recent Review Flowsheet Data    Labs for ITP Cardiac and Pulmonary Rehab Latest Ref Rng & Units 12/03/2010 12/06/2010 12/06/2010 05/23/2018   PHART 7.35 - 7.45 7.421 7.377 - -   PCO2ART 35 - 45 mmHg 35.7 47.4(H) - -   HCO3 20.0 - 28.0 mmol/L 22.8 27.9(H) - 24.2   TCO2 0 - 100 mmol/L 23.9 29 27  -   ACIDBASEDEF 0.0 - 2.0 mmol/L 1.0 - - 0.2   O2SAT % 98.0 96.0 - 77.9      Capillary Blood Glucose: No results found for: GLUCAP   Pulmonary Assessment Scores:  Pulmonary Assessment Scores    Row Name 01/08/20 1606         ADL UCSD   ADL Phase Entry     SOB Score total 39       CAT Score   CAT Score 31       mMRC Score   mMRC Score 3           UCSD: Self-administered rating of dyspnea associated with activities of daily living (ADLs) 6-point scale (0 = "not at all" to 5 = "maximal or unable to do because of breathlessness")  Scoring Scores range from 0 to 120.  Minimally important difference is 5 units  CAT: CAT can identify the health impairment of COPD patients and is better correlated with disease progression.  CAT has a scoring range of zero to 40. The CAT score is classified into four groups of low (less than 10), medium (10 - 20), high (21-30) and very high (31-40) based on the impact level of disease on health status. A CAT score over 10 suggests significant symptoms.  A worsening CAT score could be explained by an exacerbation, poor medication adherence, poor inhaler technique, or progression of COPD or comorbid conditions.  CAT MCID is 2 points  mMRC: mMRC (Modified Medical Research Council) Dyspnea Scale is used to assess the degree of baseline functional disability in patients of respiratory disease due to dyspnea. No minimal important difference is established. A decrease in score of 1 point or greater is considered a positive  change.   Pulmonary Function Assessment:   Exercise Target Goals: Exercise Program Goal: Individual exercise prescription set using results from initial 6 min walk test and THRR while considering  patient's activity barriers and safety.   Exercise Prescription Goal: Initial exercise prescription builds to 30-45 minutes a day of aerobic activity, 2-3 days per week.  Home exercise guidelines will be given to patient during program as part of exercise prescription that the participant will acknowledge.  Activity Barriers & Risk Stratification:  Activity Barriers &  Cardiac Risk Stratification - 01/08/20 0956      Activity Barriers & Cardiac Risk Stratification   Activity Barriers Back Problems;Shortness of Breath;Deconditioning;Muscular Weakness           6 Minute Walk:  6 Minute Walk    Row Name 01/08/20 1547         6 Minute Walk   Phase Initial     Distance 1280 feet     Walk Time 6 minutes     # of Rest Breaks 0     MPH 2.42     METS 2.96     RPE 13     Perceived Dyspnea  2     VO2 Peak 10.35     Symptoms No     Resting HR 70 bpm     Resting BP 118/62     Resting Oxygen Saturation  95 %     Exercise Oxygen Saturation  during 6 min walk 92 %     Max Ex. HR 98 bpm     Max Ex. BP 118/60     2 Minute Post BP 112/60       Interval HR   1 Minute HR 89     2 Minute HR 90     3 Minute HR 96     4 Minute HR 96     5 Minute HR 96     6 Minute HR 98     2 Minute Post HR 96     Interval Heart Rate? Yes       Interval Oxygen   Interval Oxygen? Yes     Baseline Oxygen Saturation % 95 %     1 Minute Oxygen Saturation % 97 %     1 Minute Liters of Oxygen 2 L     2 Minute Oxygen Saturation % 97 %     2 Minute Liters of Oxygen 2 L     3 Minute Oxygen Saturation % 97 %     3 Minute Liters of Oxygen 2 L     4 Minute Oxygen Saturation % 98 %     4 Minute Liters of Oxygen 2 L     5 Minute Oxygen Saturation % 94 %     5 Minute Liters of Oxygen 2 L     6 Minute Oxygen  Saturation % 92 %     6 Minute Liters of Oxygen 2 L     2 Minute Post Oxygen Saturation % 96 %     2 Minute Post Liters of Oxygen 2 L            Oxygen Initial Assessment:  Oxygen Initial Assessment - 01/08/20 1544      Home Oxygen   Home Oxygen Device Portable Concentrator;Home Concentrator    Sleep Oxygen Prescription Continuous    Liters per minute 2    Home Exercise Oxygen Prescription Continuous    Liters per minute 2    Home Resting Oxygen Prescription None    Compliance with Home Oxygen Use Yes      Initial 6 min Walk   Oxygen Used Continuous    Liters per minute 2      Program Oxygen Prescription   Program Oxygen Prescription Continuous    Liters per minute 2      Intervention   Short Term Goals To learn and exhibit compliance with exercise, home and travel O2 prescription;To learn and understand importance of monitoring SPO2 with pulse  oximeter and demonstrate accurate use of the pulse oximeter.;To learn and understand importance of maintaining oxygen saturations>88%;To learn and demonstrate proper pursed lip breathing techniques or other breathing techniques.;To learn and demonstrate proper use of respiratory medications    Long  Term Goals Exhibits compliance with exercise, home and travel O2 prescription;Verbalizes importance of monitoring SPO2 with pulse oximeter and return demonstration;Maintenance of O2 saturations>88%;Exhibits proper breathing techniques, such as pursed lip breathing or other method taught during program session;Compliance with respiratory medication;Demonstrates proper use of MDI's           Oxygen Re-Evaluation:   Oxygen Discharge (Final Oxygen Re-Evaluation):   Initial Exercise Prescription:   Perform Capillary Blood Glucose checks as needed.  Exercise Prescription Changes:   Exercise Comments:   Exercise Goals and Review:  Exercise Goals    Row Name 01/08/20 0958 01/08/20 1553           Exercise Goals   Increase  Physical Activity Yes Yes      Intervention Provide advice, education, support and counseling about physical activity/exercise needs.;Develop an individualized exercise prescription for aerobic and resistive training based on initial evaluation findings, risk stratification, comorbidities and participant's personal goals. Provide advice, education, support and counseling about physical activity/exercise needs.;Develop an individualized exercise prescription for aerobic and resistive training based on initial evaluation findings, risk stratification, comorbidities and participant's personal goals.      Expected Outcomes Short Term: Attend rehab on a regular basis to increase amount of physical activity.;Long Term: Add in home exercise to make exercise part of routine and to increase amount of physical activity.;Long Term: Exercising regularly at least 3-5 days a week. Short Term: Attend rehab on a regular basis to increase amount of physical activity.;Long Term: Add in home exercise to make exercise part of routine and to increase amount of physical activity.;Long Term: Exercising regularly at least 3-5 days a week.      Increase Strength and Stamina Yes Yes      Intervention Provide advice, education, support and counseling about physical activity/exercise needs.;Develop an individualized exercise prescription for aerobic and resistive training based on initial evaluation findings, risk stratification, comorbidities and participant's personal goals. Provide advice, education, support and counseling about physical activity/exercise needs.;Develop an individualized exercise prescription for aerobic and resistive training based on initial evaluation findings, risk stratification, comorbidities and participant's personal goals.      Expected Outcomes Short Term: Increase workloads from initial exercise prescription for resistance, speed, and METs.;Short Term: Perform resistance training exercises routinely during  rehab and add in resistance training at home;Long Term: Improve cardiorespiratory fitness, muscular endurance and strength as measured by increased METs and functional capacity (6MWT) Short Term: Increase workloads from initial exercise prescription for resistance, speed, and METs.;Short Term: Perform resistance training exercises routinely during rehab and add in resistance training at home;Long Term: Improve cardiorespiratory fitness, muscular endurance and strength as measured by increased METs and functional capacity (6MWT)      Able to understand and use rate of perceived exertion (RPE) scale Yes Yes      Intervention Provide education and explanation on how to use RPE scale Provide education and explanation on how to use RPE scale      Expected Outcomes Short Term: Able to use RPE daily in rehab to express subjective intensity level;Long Term:  Able to use RPE to guide intensity level when exercising independently Short Term: Able to use RPE daily in rehab to express subjective intensity level;Long Term:  Able to use RPE to  guide intensity level when exercising independently      Able to understand and use Dyspnea scale Yes Yes      Intervention Provide education and explanation on how to use Dyspnea scale Provide education and explanation on how to use Dyspnea scale      Expected Outcomes Short Term: Able to use Dyspnea scale daily in rehab to express subjective sense of shortness of breath during exertion;Long Term: Able to use Dyspnea scale to guide intensity level when exercising independently Short Term: Able to use Dyspnea scale daily in rehab to express subjective sense of shortness of breath during exertion;Long Term: Able to use Dyspnea scale to guide intensity level when exercising independently      Knowledge and understanding of Target Heart Rate Range (THRR) Yes Yes      Intervention Provide education and explanation of THRR including how the numbers were predicted and where they are  located for reference Provide education and explanation of THRR including how the numbers were predicted and where they are located for reference      Expected Outcomes Short Term: Able to state/look up THRR;Long Term: Able to use THRR to govern intensity when exercising independently;Short Term: Able to use daily as guideline for intensity in rehab Short Term: Able to state/look up THRR;Long Term: Able to use THRR to govern intensity when exercising independently;Short Term: Able to use daily as guideline for intensity in rehab      Understanding of Exercise Prescription Yes Yes      Intervention Provide education, explanation, and written materials on patient's individual exercise prescription Provide education, explanation, and written materials on patient's individual exercise prescription      Expected Outcomes Short Term: Able to explain program exercise prescription;Long Term: Able to explain home exercise prescription to exercise independently Short Term: Able to explain program exercise prescription;Long Term: Able to explain home exercise prescription to exercise independently             Exercise Goals Re-Evaluation :   Discharge Exercise Prescription (Final Exercise Prescription Changes):   Nutrition:  Target Goals: Understanding of nutrition guidelines, daily intake of sodium 1500mg , cholesterol 200mg , calories 30% from fat and 7% or less from saturated fats, daily to have 5 or more servings of fruits and vegetables.  Biometrics:  Pre Biometrics - 01/08/20 1554      Pre Biometrics   Grip Strength 19 kg            Nutrition Therapy Plan and Nutrition Goals:   Nutrition Assessments:   Nutrition Goals Re-Evaluation:   Nutrition Goals Discharge (Final Nutrition Goals Re-Evaluation):   Psychosocial: Target Goals: Acknowledge presence or absence of significant depression and/or stress, maximize coping skills, provide positive support system. Participant is able to  verbalize types and ability to use techniques and skills needed for reducing stress and depression.  Initial Review & Psychosocial Screening:  Initial Psych Review & Screening - 01/08/20 1010      Initial Review   Current issues with None Identified      Family Dynamics   Good Support System? Yes      Barriers   Psychosocial barriers to participate in program There are no identifiable barriers or psychosocial needs.;The patient should benefit from training in stress management and relaxation.      Screening Interventions   Interventions Encouraged to exercise;Provide feedback about the scores to participant           Quality of Life Scores:  Scores of 19 and  below usually indicate a poorer quality of life in these areas.  A difference of  2-3 points is a clinically meaningful difference.  A difference of 2-3 points in the total score of the Quality of Life Index has been associated with significant improvement in overall quality of life, self-image, physical symptoms, and general health in studies assessing change in quality of life.  PHQ-9: Recent Review Flowsheet Data    Depression screen [facility] 2/9 01/08/2020 01/08/2020   Decreased Interest 0 0   Down, Depressed, Hopeless 0 0   PHQ - 2 Score 0 0   Altered sleeping 0 -   Tired, decreased energy 3 -   Change in appetite 0 -   Feeling bad or failure about yourself  0 -   Trouble concentrating 0 -   Moving slowly or fidgety/restless 0 -   Suicidal thoughts 0 -   PHQ-9 Score 3 -   Difficult doing work/chores Somewhat difficult -     Interpretation of Total Score  Total Score Depression Severity:  1-4 = Minimal depression, 5-9 = Mild depression, 10-14 = Moderate depression, 15-19 = Moderately severe depression, 20-27 = Severe depression   Psychosocial Evaluation and Intervention:  Psychosocial Evaluation - 01/08/20 1011      Psychosocial Evaluation & Interventions   Interventions Stress management education;Encouraged to  exercise with the program and follow exercise prescription;Relaxation education    Continue Psychosocial Services  Follow up required by staff           Psychosocial Re-Evaluation:   Psychosocial Discharge (Final Psychosocial Re-Evaluation):   Education: Education Goals: Education classes will be provided on a weekly basis, covering required topics. Participant will state understanding/return demonstration of topics presented.  Learning Barriers/Preferences:  Learning Barriers/Preferences - 01/08/20 1011      Learning Barriers/Preferences   Learning Preferences Group Instruction;Computer/Internet;Individual Instruction;Verbal Instruction;Video;Written Material           Education Topics: Risk Factor Reduction:  -Group instruction that is supported by a PowerPoint presentation. Instructor discusses the definition of a risk factor, different risk factors for pulmonary disease, and how the heart and lungs work together.     Nutrition for Pulmonary Patient:  -Group instruction provided by PowerPoint slides, verbal discussion, and written materials to support subject matter. The instructor gives an explanation and review of healthy diet recommendations, which includes a discussion on weight management, recommendations for fruit and vegetable consumption, as well as protein, fluid, caffeine, fiber, sodium, sugar, and alcohol. Tips for eating when patients are short of breath are discussed.   Pursed Lip Breathing:  -Group instruction that is supported by demonstration and informational handouts. Instructor discusses the benefits of pursed lip and diaphragmatic breathing and detailed demonstration on how to preform both.     Oxygen Safety:  -Group instruction provided by PowerPoint, verbal discussion, and written material to support subject matter. There is an overview of "What is Oxygen" and "Why do we need it".  Instructor also reviews how to create a safe environment for oxygen  use, the importance of using oxygen as prescribed, and the risks of noncompliance. There is a brief discussion on traveling with oxygen and resources the patient may utilize.   Oxygen Equipment:  -Group instruction provided by [facility] Staff utilizing handouts, written materials, and equipment demonstrations.   Signs and Symptoms:  -Group instruction provided by written material and verbal discussion to support subject matter. Warning signs and symptoms of infection, stroke, and heart attack are reviewed and when to call  the physician/911 reinforced. Tips for preventing the spread of infection discussed.   Advanced Directives:  -Group instruction provided by verbal instruction and written material to support subject matter. Instructor reviews Advanced Directive laws and proper instruction for filling out document.   Pulmonary Video:  -Group video education that reviews the importance of medication and oxygen compliance, exercise, good nutrition, pulmonary hygiene, and pursed lip and diaphragmatic breathing for the pulmonary patient.   Exercise for the Pulmonary Patient:  -Group instruction that is supported by a PowerPoint presentation. Instructor discusses benefits of exercise, core components of exercise, frequency, duration, and intensity of an exercise routine, importance of utilizing pulse oximetry during exercise, safety while exercising, and options of places to exercise outside of rehab.     Pulmonary Medications:  -Verbally interactive group education provided by instructor with focus on inhaled medications and proper administration.   Anatomy and Physiology of the Respiratory System and Intimacy:  -Group instruction provided by PowerPoint, verbal discussion, and written material to support subject matter. Instructor reviews respiratory cycle and anatomical components of the respiratory system and their functions. Instructor also reviews differences in obstructive and  restrictive respiratory diseases with examples of each. Intimacy, Sex, and Sexuality differences are reviewed with a discussion on how relationships can change when diagnosed with pulmonary disease. Common sexual concerns are reviewed.   MD DAY -A group question and answer session with a medical doctor that allows participants to ask questions that relate to their pulmonary disease state.   OTHER EDUCATION -Group or individual verbal, written, or video instructions that support the educational goals of the pulmonary rehab program.   Holiday Eating Survival Tips:  -Group instruction provided by PowerPoint slides, verbal discussion, and written materials to support subject matter. The instructor gives patients tips, tricks, and techniques to help them not only survive but enjoy the holidays despite the onslaught of food that accompanies the holidays.   Knowledge Questionnaire Score:  Knowledge Questionnaire Score - 01/08/20 1559      Knowledge Questionnaire Score   Pre Score 17/18           Core Components/Risk Factors/Patient Goals at Admission:  Personal Goals and Risk Factors at Admission - 01/08/20 1014      Core Components/Risk Factors/Patient Goals on Admission    Weight Management Weight Maintenance    Improve shortness of breath with ADL's Yes    Intervention Provide education, individualized exercise plan and daily activity instruction to help decrease symptoms of SOB with activities of daily living.    Expected Outcomes Short Term: Improve cardiorespiratory fitness to achieve a reduction of symptoms when performing ADLs;Long Term: Be able to perform more ADLs without symptoms or delay the onset of symptoms           Core Components/Risk Factors/Patient Goals Review:    Core Components/Risk Factors/Patient Goals at Discharge (Final Review):    ITP Comments:   Comments:

## 2020-01-10 MED ORDER — EMPTY CONTAINER
2 refills | 0 days
Start: 2020-01-10 — End: ?

## 2020-01-10 NOTE — Unmapped (Signed)
[facility] Clinical Assessment & Refill Coordination Note    Jordan [name], DOB: Jul 24, 1955  Phone: [phone] (home)     All above HIPAA information was verified with patient.     Was a Nurse, learning disability used for this call? No    Specialty Medication(s):   CF/Pulmonary: -Nebulized colistin (150mg  vials) and supply kit     Current Outpatient Medications   Medication Sig Dispense Refill   ??? albuterol 2.5 mg /3 mL (0.083 %) nebulizer solution Inhale 3 mL (2.5 mg total) by nebulization Two (2) times a day. 540 mL 3   ??? albuterol HFA 90 mcg/actuation inhaler Inhale 2 puffs Four (4) times a day. 25.5 g 3   ??? alcohol swabs (ALCOHOL PREP PADS) PadM Use as directed with inhaled antibiotics 100 each 5   ??? ascorbic acid, vitamin C, (VITAMIN C) 1000 MG tablet Take 1,000 mg by mouth daily.     ??? azithromycin (ZITHROMAX) 250 MG tablet Take 1 tablet (250 mg total) by mouth daily. 90 tablet 3   ??? colistimethate (COLYMYCIN) 150 mg injection Inject 2mL sterile water for injection to mix colistin vial, then draw up 2mL (150mg ) and inhale 2 times a day,28 days on and 28 days off. 60 each 5   ??? dornase alfa (PULMOZYME) 1 mg/mL nebulizer solution Inhale 2.5 mg daily. Use at least 30-60 minutes before airway clearance, or after airway clearance 225 mL 3   ??? ferrous sulfate (IRON, FERROUS SULFATE,) 325 (65 FE) MG tablet Take 325 mg by mouth Two (2) times a day. BID     ??? HYDROcodone-acetaminophen (NORCO) 7.5-325 mg per tablet Take 1 tablet by mouth three (3) times a day (at 6am, noon and 6pm).     ??? HYDROcodone-homatropine (HYCODAN) 5-1.5 mg/5 mL syrup Take 5 mL by mouth every four (4) hours as needed for cough. 120 mL 0   ??? montelukast (SINGULAIR) 10 mg tablet Take 1 tablet (10 mg total) by mouth nightly. 90 tablet 3   ??? multivitamin-minerals-lutein Tab Take 1 tablet by mouth daily.      ??? nebulizers (LC PLUS) Misc use with Colistin 1 each 5   ??? nebulizers (LC PLUS) Misc Use as directed with hypertonic saline 10% 3 each 3   ??? omeprazole (PRILOSEC) 20 MG capsule Take 20 mg by mouth Two (2) times a day.      ??? sodium chloride 10 % Nebu Inhale 5 mL by nebulization Two (2) times a day. 900 mL 3   ??? sterile water, PF, Soln Use 2mL to mix Colistin,then add additional 2mL to neb cup with 2mL of mixed colistin.Inhale twice daily 28 days on and 28 days off. 600 mL 5   ??? SYMBICORT 160-4.5 mcg/actuation inhaler INHALE 2 PUFFS INTO THE LUNGS 2 TIMES A DAY. 30.6 g 1   ??? syringe with needle (BD LUER-LOK SYRINGE) 3 mL 20 gauge x 1 1/2 Syrg For use with inhaled antibiotic (Colistin) 60 each 30   ??? syringe with needle (BD LUER-LOK SYRINGE) 3 mL 20 gauge x 1 Syrg For use with inhaled antibiotic (Colistin) 60 each 5   ??? tobramycin, PF, ([name]) 300 mg/5 mL nebulizer solution Inhale 5 mL (300 mg total) by nebulization every twelve (12) hours. 28 days on and 28 days off. 280 mL 5     No current facility-administered medications for this visit.        Changes to medications: Wen reports no changes at this time.  Allergies   Allergen Reactions   ??? Gentamicin Other (See Comments)     BALANCE ISSUES   ??? Tobramycin Other (See Comments)     ototoxicity   ??? Morphine Nausea And Vomiting       Changes to allergies: No    SPECIALTY MEDICATION ADHERENCE     Colistin 150 mg: 0 days of medicine on hand , will start Nov 1      Medication Adherence    Patient reported X missed doses in the last month: 0  Specialty Medication: Colistin 150mg    Patient is on additional specialty medications: No  Informant: patient  Support network for adherence: family member          Specialty medication(s) dose(s) confirmed: Regimen is correct and unchanged.     Are there any concerns with adherence? Yes: patient is having trouble affording meds but is Working on it    Adherence counseling provided? Not needed    CLINICAL MANAGEMENT AND INTERVENTION      Clinical Benefit Assessment:    Do you feel the medicine is effective or helping your condition? Yes    Clinical Benefit counseling provided? Not needed    Adverse Effects Assessment:    Are you experiencing any side effects? No    Are you experiencing difficulty administering your medicine? No    Quality of Life Assessment:    How many days over the past month did your pseudamonas infection  keep you from your normal activities? For example, brushing your teeth or getting up in the morning. Patient declined to answer    Have you discussed this with your provider? Yes    Therapy Appropriateness:    Is therapy appropriate? Yes, therapy is appropriate and should be continued    DISEASE/MEDICATION-SPECIFIC INFORMATION      N/A    PATIENT SPECIFIC NEEDS     - Does the patient have any physical, cognitive, or cultural barriers? No    - Is the patient high risk? No    - Does the patient require a Care Management Plan? No     - Does the patient require physician intervention or other additional services (i.e. nutrition, smoking cessation, social work)? No      SHIPPING     Specialty Medication(s) to be Shipped:   CF/Pulmonary: -Nebulized colistin (150mg  vials) and supply kit    Other medication(s) to be shipped: syringes, sterile water, alcohol swab, neb cup     Changes to insurance: No    Delivery Scheduled: Yes, Expected medication delivery date: 10/26.     Medication will be delivered via UPS to the confirmed prescription address in [facility].    The patient will receive a drug information handout for each medication shipped and additional FDA Medication Guides as required.  Verified that patient has previously received a Conservation officer, historic buildings.    All of the patient's questions and concerns have been addressed.    Julianne Rice   [facility] Specialty Pharmacist

## 2020-01-15 ENCOUNTER — Ambulatory Visit (HOSPITAL_COMMUNITY): Payer: PPO

## 2020-01-15 MED FILL — LC PLUS MISC: 30 days supply | Qty: 1 | Fill #3 | Status: AC

## 2020-01-15 MED FILL — BD LUER-LOK SYRINGE 3 ML 20 GAUGE X 1": 30 days supply | Qty: 60 | Fill #2 | Status: AC

## 2020-01-15 MED FILL — BD LUER-LOK SYRINGE 3 ML 20 GAUGE X 1": 30 days supply | Qty: 60 | Fill #2

## 2020-01-15 MED FILL — EMPTY CONTAINER: 90 days supply | Qty: 1 | Fill #0

## 2020-01-15 MED FILL — WATER FOR INJECTION, STERILE INJECTION SOLUTION: 30 days supply | Qty: 600 | Fill #3 | Status: AC

## 2020-01-15 MED FILL — WATER FOR INJECTION, STERILE INJECTION SOLUTION: 30 days supply | Qty: 600 | Fill #3

## 2020-01-15 MED FILL — EMPTY CONTAINER: 90 days supply | Qty: 1 | Fill #0 | Status: AC

## 2020-01-15 MED FILL — ALCOHOL PREP PADS: 50 days supply | Qty: 100 | Fill #3 | Status: AC

## 2020-01-15 MED FILL — COLISTIN (COLISTIMETHATE SODIUM) 150 MG SOLUTION FOR INJECTION: 30 days supply | Qty: 60 | Fill #3

## 2020-01-15 MED FILL — LC PLUS MISC: 30 days supply | Qty: 1 | Fill #3

## 2020-01-15 MED FILL — COLISTIN (COLISTIMETHATE SODIUM) 150 MG SOLUTION FOR INJECTION: 30 days supply | Qty: 60 | Fill #3 | Status: AC

## 2020-01-15 MED FILL — ALCOHOL PREP PADS: 50 days supply | Qty: 100 | Fill #3

## 2020-01-16 ENCOUNTER — Other Ambulatory Visit: Payer: Self-pay

## 2020-01-16 ENCOUNTER — Encounter (HOSPITAL_COMMUNITY)
Admission: RE | Admit: 2020-01-16 | Discharge: 2020-01-16 | Disposition: A | Discharge status: Home / Self Care | Payer: PPO | Source: Ambulatory Visit | Attending: Cardiology | Admitting: Cardiology

## 2020-01-16 DIAGNOSIS — J479 Bronchiectasis, uncomplicated: Secondary | ICD-10-CM | POA: Diagnosis not present

## 2020-01-16 NOTE — Progress Notes (Signed)
Daily Session Note  Patient Details  Name: [name] MRN: [id number] Date of Birth: 1955/08/30 Referring Provider:     Pulmonary Rehab Walk Test from 01/08/2020 in [place]  Referring Provider Dr. Olena Heckle      Encounter Date: 01/16/2020  Check In:  Session Check In - 01/16/20 1430      Check-In   Supervising physician immediately available to respond to emergencies [facility] immediately available    Physician(s) Dr. Dessa Phi    Location [facility]    Staff Present Maurice Small, RN, BSN;Zanayah Shadowens Ysidro Evert, RN;Jessica Hassell Done, MS, ACSM-CEP, Exercise Physiologist    Virtual Visit No    Medication changes reported     No    Fall or balance concerns reported    No    Tobacco Cessation No Change    Warm-up and Cool-down Performed on first and last piece of equipment    Resistance Training Performed Yes    VAD Patient? No    PAD/SET Patient? No      Pain Assessment   Currently in Pain? No/denies    Pain Score 0-No pain    Multiple Pain Sites No           Capillary Blood Glucose: No results found for this or any previous visit (from the past 24 hour(s)).    Social History   Tobacco Use  Smoking Status Never Smoker  Smokeless Tobacco Never Used    Goals Met:  Exercise tolerated well No report of cardiac concerns or symptoms Strength training completed today  Goals Unmet:  Not Applicable  Comments: Service time is from 1400 to 1510    Dr. Fransico Him is Medical Director for Cardiac Rehab at [facility].

## 2020-01-18 ENCOUNTER — Other Ambulatory Visit: Payer: Self-pay

## 2020-01-18 ENCOUNTER — Encounter (HOSPITAL_COMMUNITY)
Admission: RE | Admit: 2020-01-18 | Discharge: 2020-01-18 | Disposition: A | Discharge status: Home / Self Care | Payer: PPO | Source: Ambulatory Visit | Attending: Cardiology | Admitting: Cardiology

## 2020-01-18 DIAGNOSIS — J479 Bronchiectasis, uncomplicated: Secondary | ICD-10-CM | POA: Diagnosis not present

## 2020-01-18 NOTE — Progress Notes (Signed)
Pulmonary Individual Treatment Plan  Patient Details  Name: [name] MRN: [id number] Date of Birth: [birth year] Referring Provider:     Pulmonary Rehab Walk Test from 01/08/2020 in [place]  Referring Provider Dr. Olena Heckle      Initial Encounter Date:    Pulmonary Rehab Walk Test from 01/08/2020 in [place]  Date 01/08/20      Visit Diagnosis: Bronchiectasis without acute exacerbation ([place])  Patient's Home Medications on Admission:   Current Outpatient Medications:  .  acidophilus (RISAQUAD) CAPS capsule, Take 2 capsules by mouth daily., Disp: 60 capsule, Rfl: 0 .  albuterol (PROVENTIL HFA;VENTOLIN HFA) 108 (90 Base) MCG/ACT inhaler, Inhale 2 puffs into the lungs every 4 (four) hours as needed for wheezing or shortness of breath. , Disp: , Rfl:  .  albuterol (PROVENTIL) (2.5 MG/3ML) 0.083% nebulizer solution, Inhale 2.5 mg into the lungs 4 (four) times daily. , Disp: , Rfl:  .  ascorbic acid (VITAMIN C) 250 MG CHEW, Chew 1,000 mg by mouth daily. , Disp: , Rfl:  .  azithromycin (ZITHROMAX) 250 MG tablet, Take 1 tablet (250 mg total) by mouth daily., Disp: , Rfl: 0 .  budesonide-formoterol (SYMBICORT) 160-4.5 MCG/ACT inhaler, Inhale 2 puffs into the lungs 2 (two) times daily., Disp: , Rfl:  .  Cholecalciferol 25 MCG (1000 UT) tablet, Take 1,000 Units by mouth daily., Disp: , Rfl:  .  Colistimethate Sodium POWD, Take 2 mLs by nebulization 2 (two) times daily. Use only every other month. 150mg  injection., Disp: , Rfl:  .  dornase alpha (PULMOZYME) 1 MG/ML nebulizer solution, Take 2.5 mg by nebulization daily. Use at least 30-60 minutes before airway clearance or after airway clearance, Disp: , Rfl:  .  doxylamine, Sleep, (UNISOM) 25 MG tablet, Take 25 mg by mouth at bedtime., Disp: , Rfl:  .  ferrous sulfate 325 (65 FE) MG tablet, Take 325 mg by mouth 2 (two) times daily., Disp: , Rfl:  .  fluticasone (FLONASE) 50  MCG/ACT nasal spray, Place 2 sprays into both nostrils daily., Disp: , Rfl:  .  HYDROcodone-acetaminophen (NORCO) 7.5-325 MG tablet, Take 1 tablet by mouth every 6 (six) hours as needed for moderate pain. (Patient taking differently: Take 1 tablet by mouth 3 (three) times daily. ), Disp: 20 tablet, Rfl: 0 .  HYDROcodone-homatropine (HYCODAN) 5-1.5 MG/5ML syrup, Take 5 mLs by mouth every 6 (six) hours as needed for cough. (Patient taking differently: Take 5 mLs by mouth every 4 (four) hours as needed for cough. ), Disp: 240 mL, Rfl: 0 .  montelukast (SINGULAIR) 10 MG tablet, Take 10 mg by mouth at bedtime., Disp: , Rfl:  .  Multiple Vitamin (MULTIVITAMIN WITH MINERALS) TABS tablet, Take 1 tablet by mouth daily., Disp: , Rfl:  .  NON FORMULARY, Use Acapella twice daily for airway clearance. Dx. 494.0. Mucous clearing device., Disp: , Rfl:  .  omeprazole (PRILOSEC) 20 MG capsule, Take 20 mg by mouth 2 (two) times daily. , Disp: , Rfl:  .  Respiratory Therapy Supplies (FLUTTER) DEVI, Use several times daily as needed for congestion/thick mucus, Disp: , Rfl:  .  Sodium Chloride 10 % NEBU, Inhale 5 mLs into the lungs 2 (two) times daily., Disp: , Rfl:  .  tobramycin, PF, ([name]) 300 MG/5ML nebulizer solution, Take 300 mg by nebulization 2 (two) times daily. Use only every other month, Disp: , Rfl:  .  Water For Irrigation, Sterile (STERILE WATER FOR  IRRIGATION), 4 mLs daily. Mix 24ml with colistimethate powder to use for nebulization. 28 days on and 28 days off., Disp: , Rfl:   Past Medical History: Past Medical History:  Diagnosis Date  . Bronchiectasis ([place])   . Bronchiolitis   . Dizziness and giddiness 07/26/2012   none recent  . DJD (degenerative joint disease)    back and neck  . Dyspnea    with walking long distances  . Gastroesophageal reflux disease   . History of kidney stones yrs ago   passed on own  . HTN (hypertension)   . OSA (obstructive sleep apnea)    none since 30 lb weight loss   . Pneumonia last time 2017  . Pneumonia with lung abscess ([place])   . PONV (postoperative nausea and vomiting)    likes scopolamine patch, trouble turning neck to left  . Prostate cancer ([place]) dx Aug 12, 2017  . Tear of left rotator cuff    healed on own    Tobacco Use: Social History   Tobacco Use  Smoking Status Never Smoker  Smokeless Tobacco Never Used    Labs: Recent Review Flowsheet Data    Labs for ITP Cardiac and Pulmonary Rehab Latest Ref Rng & Units 12/03/2010 12/06/2010 12/06/2010 05/23/2018   PHART 7.35 - 7.45 7.421 7.377 - -   PCO2ART 35 - 45 mmHg 35.7 47.4(H) - -   HCO3 20.0 - 28.0 mmol/L 22.8 27.9(H) - 24.2   TCO2 0 - 100 mmol/L 23.9 29 27  -   ACIDBASEDEF 0.0 - 2.0 mmol/L 1.0 - - 0.2   O2SAT % 98.0 96.0 - 77.9      Capillary Blood Glucose: No results found for: GLUCAP   Pulmonary Assessment Scores:  Pulmonary Assessment Scores    Row Name 01/08/20 1606         ADL UCSD   ADL Phase Entry     SOB Score total 39       CAT Score   CAT Score 31       mMRC Score   mMRC Score 3           UCSD: Self-administered rating of dyspnea associated with activities of daily living (ADLs) 6-point scale (0 = "not at all" to 5 = "maximal or unable to do because of breathlessness")  Scoring Scores range from 0 to 120.  Minimally important difference is 5 units  CAT: CAT can identify the health impairment of COPD patients and is better correlated with disease progression.  CAT has a scoring range of zero to 40. The CAT score is classified into four groups of low (less than 10), medium (10 - 20), high (21-30) and very high (31-40) based on the impact level of disease on health status. A CAT score over 10 suggests significant symptoms.  A worsening CAT score could be explained by an exacerbation, poor medication adherence, poor inhaler technique, or progression of COPD or comorbid conditions.  CAT MCID is 2 points  mMRC: mMRC (Modified Medical Research Council) Dyspnea  Scale is used to assess the degree of baseline functional disability in patients of respiratory disease due to dyspnea. No minimal important difference is established. A decrease in score of 1 point or greater is considered a positive change.   Pulmonary Function Assessment:   Exercise Target Goals: Exercise Program Goal: Individual exercise prescription set using results from initial 6 min walk test and THRR while considering  patient's activity barriers and safety.   Exercise Prescription Goal: Initial  exercise prescription builds to 30-45 minutes a day of aerobic activity, 2-3 days per week.  Home exercise guidelines will be given to patient during program as part of exercise prescription that the participant will acknowledge.  Activity Barriers & Risk Stratification:  Activity Barriers & Cardiac Risk Stratification - 01/08/20 0956      Activity Barriers & Cardiac Risk Stratification   Activity Barriers Back Problems;Shortness of Breath;Deconditioning;Muscular Weakness           6 Minute Walk:  6 Minute Walk    Row Name 01/08/20 1547         6 Minute Walk   Phase Initial     Distance 1280 feet     Walk Time 6 minutes     # of Rest Breaks 0     MPH 2.42     METS 2.96     RPE 13     Perceived Dyspnea  2     VO2 Peak 10.35     Symptoms No     Resting HR 70 bpm     Resting BP 118/62     Resting Oxygen Saturation  95 %     Exercise Oxygen Saturation  during 6 min walk 92 %     Max Ex. HR 98 bpm     Max Ex. BP 118/60     2 Minute Post BP 112/60       Interval HR   1 Minute HR 89     2 Minute HR 90     3 Minute HR 96     4 Minute HR 96     5 Minute HR 96     6 Minute HR 98     2 Minute Post HR 96     Interval Heart Rate? Yes       Interval Oxygen   Interval Oxygen? Yes     Baseline Oxygen Saturation % 95 %     1 Minute Oxygen Saturation % 97 %     1 Minute Liters of Oxygen 2 L     2 Minute Oxygen Saturation % 97 %     2 Minute Liters of Oxygen 2 L     3  Minute Oxygen Saturation % 97 %     3 Minute Liters of Oxygen 2 L     4 Minute Oxygen Saturation % 98 %     4 Minute Liters of Oxygen 2 L     5 Minute Oxygen Saturation % 94 %     5 Minute Liters of Oxygen 2 L     6 Minute Oxygen Saturation % 92 %     6 Minute Liters of Oxygen 2 L     2 Minute Post Oxygen Saturation % 96 %     2 Minute Post Liters of Oxygen 2 L            Oxygen Initial Assessment:  Oxygen Initial Assessment - 01/08/20 1544      Home Oxygen   Home Oxygen Device Portable Concentrator;Home Concentrator    Sleep Oxygen Prescription Continuous    Liters per minute 2    Home Exercise Oxygen Prescription Continuous    Liters per minute 2    Home Resting Oxygen Prescription None    Compliance with Home Oxygen Use Yes      Initial 6 min Walk   Oxygen Used Continuous    Liters per minute 2      Program Oxygen  Prescription   Program Oxygen Prescription Continuous    Liters per minute 2      Intervention   Short Term Goals To learn and exhibit compliance with exercise, home and travel O2 prescription;To learn and understand importance of monitoring SPO2 with pulse oximeter and demonstrate accurate use of the pulse oximeter.;To learn and understand importance of maintaining oxygen saturations>88%;To learn and demonstrate proper pursed lip breathing techniques or other breathing techniques.;To learn and demonstrate proper use of respiratory medications    Long  Term Goals Exhibits compliance with exercise, home and travel O2 prescription;Verbalizes importance of monitoring SPO2 with pulse oximeter and return demonstration;Maintenance of O2 saturations>88%;Exhibits proper breathing techniques, such as pursed lip breathing or other method taught during program session;Compliance with respiratory medication;Demonstrates proper use of MDI's           Oxygen Re-Evaluation:  Oxygen Re-Evaluation    Row Name 01/18/20 0809             Program Oxygen Prescription    Program Oxygen Prescription Continuous       Liters per minute 2         Home Oxygen   Home Oxygen Device Portable Concentrator;Home Concentrator       Sleep Oxygen Prescription Continuous       Liters per minute 2       Home Exercise Oxygen Prescription Continuous       Liters per minute 2       Home Resting Oxygen Prescription None       Compliance with Home Oxygen Use Yes         Goals/Expected Outcomes   Short Term Goals To learn and exhibit compliance with exercise, home and travel O2 prescription;To learn and understand importance of monitoring SPO2 with pulse oximeter and demonstrate accurate use of the pulse oximeter.;To learn and understand importance of maintaining oxygen saturations>88%;To learn and demonstrate proper pursed lip breathing techniques or other breathing techniques.;To learn and demonstrate proper use of respiratory medications       Long  Term Goals Exhibits compliance with exercise, home and travel O2 prescription;Verbalizes importance of monitoring SPO2 with pulse oximeter and return demonstration;Maintenance of O2 saturations>88%;Exhibits proper breathing techniques, such as pursed lip breathing or other method taught during program session;Compliance with respiratory medication;Demonstrates proper use of MDI's       Goals/Expected Outcomes compliance and understanding of oxygen saturation and pursed lip breathing              Oxygen Discharge (Final Oxygen Re-Evaluation):  Oxygen Re-Evaluation - 01/18/20 0809      Program Oxygen Prescription   Program Oxygen Prescription Continuous    Liters per minute 2      Home Oxygen   Home Oxygen Device Portable Concentrator;Home Concentrator    Sleep Oxygen Prescription Continuous    Liters per minute 2    Home Exercise Oxygen Prescription Continuous    Liters per minute 2    Home Resting Oxygen Prescription None    Compliance with Home Oxygen Use Yes      Goals/Expected Outcomes   Short Term Goals To learn  and exhibit compliance with exercise, home and travel O2 prescription;To learn and understand importance of monitoring SPO2 with pulse oximeter and demonstrate accurate use of the pulse oximeter.;To learn and understand importance of maintaining oxygen saturations>88%;To learn and demonstrate proper pursed lip breathing techniques or other breathing techniques.;To learn and demonstrate proper use of respiratory medications    Long  Term Goals Exhibits  compliance with exercise, home and travel O2 prescription;Verbalizes importance of monitoring SPO2 with pulse oximeter and return demonstration;Maintenance of O2 saturations>88%;Exhibits proper breathing techniques, such as pursed lip breathing or other method taught during program session;Compliance with respiratory medication;Demonstrates proper use of MDI's    Goals/Expected Outcomes compliance and understanding of oxygen saturation and pursed lip breathing           Initial Exercise Prescription:  Initial Exercise Prescription - 01/11/20 0800      Date of Initial Exercise RX and Referring Provider   Date 01/08/20    Referring Provider Dr. Olena Heckle    Expected Discharge Date 03/14/20      Oxygen   Oxygen Continuous    Liters 2      NuStep   Level 1    Minutes 15      Arm Ergometer   Level 1    Minutes 15      Prescription Details   Frequency (times per week) 2    Duration Progress to 30 minutes of continuous aerobic without signs/symptoms of physical distress      Intensity   THRR 40-80% of Max Heartrate 62-124    Ratings of Perceived Exertion 11-13    Perceived Dyspnea 0-4      Progression   Progression Continue to progress workloads to maintain intensity without signs/symptoms of physical distress.      Resistance Training   Training Prescription Yes    Weight orange bands    Reps 10-15           Perform Capillary Blood Glucose checks as needed.  Exercise Prescription Changes:  Exercise Prescription Changes     Row Name 01/18/20 0800             Response to Exercise   Blood Pressure (Admit) 116/66       Blood Pressure (Exercise) 122/60       Blood Pressure (Exit) 108/70       Heart Rate (Admit) 106 bpm       Heart Rate (Exercise) 105 bpm       Heart Rate (Exit) 99 bpm       Oxygen Saturation (Admit) 90 %       Oxygen Saturation (Exercise) 92 %       Oxygen Saturation (Exit) 94 %       Rating of Perceived Exertion (Exercise) 13       Perceived Dyspnea (Exercise) 3       Duration Continue with 30 min of aerobic exercise without signs/symptoms of physical distress.       Intensity Other (comment)  40%-80% HR Max         Progression   Progression Continue to progress workloads to maintain intensity without signs/symptoms of physical distress.       Average METs 2         Resistance Training   Training Prescription Yes       Weight Orange bands       Reps 10-15       Time 10 Minutes         Oxygen   Oxygen Continuous       Liters 2         NuStep   Level 2       SPM 80       Minutes 15       METs 2.1         Arm Ergometer   Level 1  RPM 46       Minutes 15              Exercise Comments:  Exercise Comments    Row Name 01/16/20 0804           Exercise Comments Pt completed first day of exercise and tolerated well with no complaints              Exercise Goals and Review:  Exercise Goals    Row Name 01/08/20 0958 01/08/20 1553           Exercise Goals   Increase Physical Activity Yes Yes      Intervention Provide advice, education, support and counseling about physical activity/exercise needs.;Develop an individualized exercise prescription for aerobic and resistive training based on initial evaluation findings, risk stratification, comorbidities and participant's personal goals. Provide advice, education, support and counseling about physical activity/exercise needs.;Develop an individualized exercise prescription for aerobic and resistive training  based on initial evaluation findings, risk stratification, comorbidities and participant's personal goals.      Expected Outcomes Short Term: Attend rehab on a regular basis to increase amount of physical activity.;Long Term: Add in home exercise to make exercise part of routine and to increase amount of physical activity.;Long Term: Exercising regularly at least 3-5 days a week. Short Term: Attend rehab on a regular basis to increase amount of physical activity.;Long Term: Add in home exercise to make exercise part of routine and to increase amount of physical activity.;Long Term: Exercising regularly at least 3-5 days a week.      Increase Strength and Stamina Yes Yes      Intervention Provide advice, education, support and counseling about physical activity/exercise needs.;Develop an individualized exercise prescription for aerobic and resistive training based on initial evaluation findings, risk stratification, comorbidities and participant's personal goals. Provide advice, education, support and counseling about physical activity/exercise needs.;Develop an individualized exercise prescription for aerobic and resistive training based on initial evaluation findings, risk stratification, comorbidities and participant's personal goals.      Expected Outcomes Short Term: Increase workloads from initial exercise prescription for resistance, speed, and METs.;Short Term: Perform resistance training exercises routinely during rehab and add in resistance training at home;Long Term: Improve cardiorespiratory fitness, muscular endurance and strength as measured by increased METs and functional capacity (6MWT) Short Term: Increase workloads from initial exercise prescription for resistance, speed, and METs.;Short Term: Perform resistance training exercises routinely during rehab and add in resistance training at home;Long Term: Improve cardiorespiratory fitness, muscular endurance and strength as measured by increased  METs and functional capacity (6MWT)      Able to understand and use rate of perceived exertion (RPE) scale Yes Yes      Intervention Provide education and explanation on how to use RPE scale Provide education and explanation on how to use RPE scale      Expected Outcomes Short Term: Able to use RPE daily in rehab to express subjective intensity level;Long Term:  Able to use RPE to guide intensity level when exercising independently Short Term: Able to use RPE daily in rehab to express subjective intensity level;Long Term:  Able to use RPE to guide intensity level when exercising independently      Able to understand and use Dyspnea scale Yes Yes      Intervention Provide education and explanation on how to use Dyspnea scale Provide education and explanation on how to use Dyspnea scale      Expected Outcomes Short Term: Able to use Dyspnea  scale daily in rehab to express subjective sense of shortness of breath during exertion;Long Term: Able to use Dyspnea scale to guide intensity level when exercising independently Short Term: Able to use Dyspnea scale daily in rehab to express subjective sense of shortness of breath during exertion;Long Term: Able to use Dyspnea scale to guide intensity level when exercising independently      Knowledge and understanding of Target Heart Rate Range (THRR) Yes Yes      Intervention Provide education and explanation of THRR including how the numbers were predicted and where they are located for reference Provide education and explanation of THRR including how the numbers were predicted and where they are located for reference      Expected Outcomes Short Term: Able to state/look up THRR;Long Term: Able to use THRR to govern intensity when exercising independently;Short Term: Able to use daily as guideline for intensity in rehab Short Term: Able to state/look up THRR;Long Term: Able to use THRR to govern intensity when exercising independently;Short Term: Able to use daily as  guideline for intensity in rehab      Understanding of Exercise Prescription Yes Yes      Intervention Provide education, explanation, and written materials on patient's individual exercise prescription Provide education, explanation, and written materials on patient's individual exercise prescription      Expected Outcomes Short Term: Able to explain program exercise prescription;Long Term: Able to explain home exercise prescription to exercise independently Short Term: Able to explain program exercise prescription;Long Term: Able to explain home exercise prescription to exercise independently             Exercise Goals Re-Evaluation :  Exercise Goals Re-Evaluation    Row Name 01/18/20 0808             Exercise Goal Re-Evaluation   Exercise Goals Review Increase Physical Activity;Increase Strength and Stamina;Able to understand and use rate of perceived exertion (RPE) scale;Able to understand and use Dyspnea scale;Knowledge and understanding of Target Heart Rate Range (THRR);Understanding of Exercise Prescription       Comments Pt has completed 1 exercise session and tolerated well. He is exercising at 2.1 METS on the Nustep and 46 RPM's on the Arm Ergometer. Will continue to monitor and progress as able       Expected Outcomes Through exercise at rehab and home the patient will decrease shortness of breath with daily activities and feel confident in carrying out an exercise regimn at home.              Discharge Exercise Prescription (Final Exercise Prescription Changes):  Exercise Prescription Changes - 01/18/20 0800      Response to Exercise   Blood Pressure (Admit) 116/66    Blood Pressure (Exercise) 122/60    Blood Pressure (Exit) 108/70    Heart Rate (Admit) 106 bpm    Heart Rate (Exercise) 105 bpm    Heart Rate (Exit) 99 bpm    Oxygen Saturation (Admit) 90 %    Oxygen Saturation (Exercise) 92 %    Oxygen Saturation (Exit) 94 %    Rating of Perceived Exertion (Exercise)  13    Perceived Dyspnea (Exercise) 3    Duration Continue with 30 min of aerobic exercise without signs/symptoms of physical distress.    Intensity Other (comment)   40%-80% HR Max     Progression   Progression Continue to progress workloads to maintain intensity without signs/symptoms of physical distress.    Average METs 2  Resistance Training   Training Prescription Yes    Weight Orange bands    Reps 10-15    Time 10 Minutes      Oxygen   Oxygen Continuous    Liters 2      NuStep   Level 2    SPM 80    Minutes 15    METs 2.1      Arm Ergometer   Level 1    RPM 46    Minutes 15           Nutrition:  Target Goals: Understanding of nutrition guidelines, daily intake of sodium 1500mg , cholesterol 200mg , calories 30% from fat and 7% or less from saturated fats, daily to have 5 or more servings of fruits and vegetables.  Biometrics:  Pre Biometrics - 01/08/20 1554      Pre Biometrics   Grip Strength 19 kg            Nutrition Therapy Plan and Nutrition Goals:  Nutrition Therapy & Goals - 01/17/20 1339      Nutrition Therapy   RD appointment deferred Yes           Nutrition Assessments:  Nutrition Assessments - 01/17/20 1340      Rate Your Plate Scores   Pre Score 43           Nutrition Goals Re-Evaluation:   Nutrition Goals Discharge (Final Nutrition Goals Re-Evaluation):   Psychosocial: Target Goals: Acknowledge presence or absence of significant depression and/or stress, maximize coping skills, provide positive support system. Participant is able to verbalize types and ability to use techniques and skills needed for reducing stress and depression.  Initial Review & Psychosocial Screening:  Initial Psych Review & Screening - 01/08/20 1010      Initial Review   Current issues with None Identified      Family Dynamics   Good Support System? Yes      Barriers   Psychosocial barriers to participate in program There are no  identifiable barriers or psychosocial needs.;The patient should benefit from training in stress management and relaxation.      Screening Interventions   Interventions Encouraged to exercise;Provide feedback about the scores to participant           Quality of Life Scores:  Scores of 19 and below usually indicate a poorer quality of life in these areas.  A difference of  2-3 points is a clinically meaningful difference.  A difference of 2-3 points in the total score of the Quality of Life Index has been associated with significant improvement in overall quality of life, self-image, physical symptoms, and general health in studies assessing change in quality of life.  PHQ-9: Recent Review Flowsheet Data    Depression screen [facility] 2/9 01/08/2020 01/08/2020   Decreased Interest 0 0   Down, Depressed, Hopeless 0 0   PHQ - 2 Score 0 0   Altered sleeping 0 -   Tired, decreased energy 3 -   Change in appetite 0 -   Feeling bad or failure about yourself  0 -   Trouble concentrating 0 -   Moving slowly or fidgety/restless 0 -   Suicidal thoughts 0 -   PHQ-9 Score 3 -   Difficult doing work/chores Somewhat difficult -     Interpretation of Total Score  Total Score Depression Severity:  1-4 = Minimal depression, 5-9 = Mild depression, 10-14 = Moderate depression, 15-19 = Moderately severe depression, 20-27 = Severe depression  Psychosocial Evaluation and Intervention:  Psychosocial Evaluation - 01/17/20 1132      Psychosocial Evaluation & Interventions   Expected Outcomes [name] will continue to display hopeful and positive outlook on life with no psychosocial barriers.           Psychosocial Re-Evaluation:  Psychosocial Re-Evaluation    Row Name 01/17/20 1129 01/17/20 1132           Psychosocial Re-Evaluation   Current issues with None Identified -      Comments [name] has strong faith foundations with supportive church family. -      Expected Outcomes - [name] will continue  to display hopeful and positive outlook on life with no psychosocial barriers.      Interventions - Encouraged to attend Pulmonary Rehabilitation for the exercise      Continue Psychosocial Services  - No Follow up required             Psychosocial Discharge (Final Psychosocial Re-Evaluation):  Psychosocial Re-Evaluation - 01/17/20 1132      Psychosocial Re-Evaluation   Expected Outcomes [name] will continue to display hopeful and positive outlook on life with no psychosocial barriers.    Interventions Encouraged to attend Pulmonary Rehabilitation for the exercise    Continue Psychosocial Services  No Follow up required           Education: Education Goals: Education classes will be provided on a weekly basis, covering required topics. Participant will state understanding/return demonstration of topics presented.  Learning Barriers/Preferences:  Learning Barriers/Preferences - 01/08/20 1011      Learning Barriers/Preferences   Learning Preferences Group Instruction;Computer/Internet;Individual Instruction;Verbal Instruction;Video;Written Material           Education Topics: Risk Factor Reduction:  -Group instruction that is supported by a PowerPoint presentation. Instructor discusses the definition of a risk factor, different risk factors for pulmonary disease, and how the heart and lungs work together.     PULMONARY REHAB OTHER RESPIRATORY from 01/18/2020 in [place]  Date 01/18/20  Educator Handout  Instruction Review Code 1- Verbalizes Understanding      Nutrition for Pulmonary Patient:  -Group instruction provided by PowerPoint slides, verbal discussion, and written materials to support subject matter. The instructor gives an explanation and review of healthy diet recommendations, which includes a discussion on weight management, recommendations for fruit and vegetable consumption, as well as protein, fluid, caffeine, fiber, sodium,  sugar, and alcohol. Tips for eating when patients are short of breath are discussed.   Pursed Lip Breathing:  -Group instruction that is supported by demonstration and informational handouts. Instructor discusses the benefits of pursed lip and diaphragmatic breathing and detailed demonstration on how to preform both.     Oxygen Safety:  -Group instruction provided by PowerPoint, verbal discussion, and written material to support subject matter. There is an overview of "What is Oxygen" and "Why do we need it".  Instructor also reviews how to create a safe environment for oxygen use, the importance of using oxygen as prescribed, and the risks of noncompliance. There is a brief discussion on traveling with oxygen and resources the patient may utilize.   Oxygen Equipment:  -Group instruction provided by [facility] Staff utilizing handouts, written materials, and equipment demonstrations.   Signs and Symptoms:  -Group instruction provided by written material and verbal discussion to support subject matter. Warning signs and symptoms of infection, stroke, and heart attack are reviewed and when to call the physician/911 reinforced. Tips for preventing the  spread of infection discussed.   Advanced Directives:  -Group instruction provided by verbal instruction and written material to support subject matter. Instructor reviews Advanced Directive laws and proper instruction for filling out document.   Pulmonary Video:  -Group video education that reviews the importance of medication and oxygen compliance, exercise, good nutrition, pulmonary hygiene, and pursed lip and diaphragmatic breathing for the pulmonary patient.   Exercise for the Pulmonary Patient:  -Group instruction that is supported by a PowerPoint presentation. Instructor discusses benefits of exercise, core components of exercise, frequency, duration, and intensity of an exercise routine, importance of utilizing pulse oximetry during  exercise, safety while exercising, and options of places to exercise outside of rehab.     Pulmonary Medications:  -Verbally interactive group education provided by instructor with focus on inhaled medications and proper administration.   Anatomy and Physiology of the Respiratory System and Intimacy:  -Group instruction provided by PowerPoint, verbal discussion, and written material to support subject matter. Instructor reviews respiratory cycle and anatomical components of the respiratory system and their functions. Instructor also reviews differences in obstructive and restrictive respiratory diseases with examples of each. Intimacy, Sex, and Sexuality differences are reviewed with a discussion on how relationships can change when diagnosed with pulmonary disease. Common sexual concerns are reviewed.   MD DAY -A group question and answer session with a medical doctor that allows participants to ask questions that relate to their pulmonary disease state.   OTHER EDUCATION -Group or individual verbal, written, or video instructions that support the educational goals of the pulmonary rehab program.   Holiday Eating Survival Tips:  -Group instruction provided by PowerPoint slides, verbal discussion, and written materials to support subject matter. The instructor gives patients tips, tricks, and techniques to help them not only survive but enjoy the holidays despite the onslaught of food that accompanies the holidays.   Knowledge Questionnaire Score:  Knowledge Questionnaire Score - 01/08/20 1559      Knowledge Questionnaire Score   Pre Score 17/18           Core Components/Risk Factors/Patient Goals at Admission:  Personal Goals and Risk Factors at Admission - 01/08/20 1014      Core Components/Risk Factors/Patient Goals on Admission    Weight Management Weight Maintenance    Improve shortness of breath with ADL's Yes    Intervention Provide education, individualized exercise  plan and daily activity instruction to help decrease symptoms of SOB with activities of daily living.    Expected Outcomes Short Term: Improve cardiorespiratory fitness to achieve a reduction of symptoms when performing ADLs;Long Term: Be able to perform more ADLs without symptoms or delay the onset of symptoms           Core Components/Risk Factors/Patient Goals Review:   Goals and Risk Factor Review    Row Name 01/17/20 1132             Core Components/Risk Factors/Patient Goals Review   Personal Goals Review Weight Management/Obesity;Develop more efficient breathing techniques such as purse lipped breathing and diaphragmatic breathing and practicing self-pacing with activity.;Improve shortness of breath with ADL's;Increase knowledge of respiratory medications and ability to use respiratory devices properly.       Review [name] completed 1 exercise session.  Not enough information at this point to assess progress.  Anticipate that [name] who is committed to getting healthy and strong so that he will not need lung transplant will make progress toward admission goals.       Expected Outcomes  See Admission Goals              Core Components/Risk Factors/Patient Goals at Discharge (Final Review):   Goals and Risk Factor Review - 01/17/20 1132      Core Components/Risk Factors/Patient Goals Review   Personal Goals Review Weight Management/Obesity;Develop more efficient breathing techniques such as purse lipped breathing and diaphragmatic breathing and practicing self-pacing with activity.;Improve shortness of breath with ADL's;Increase knowledge of respiratory medications and ability to use respiratory devices properly.    Review [name] completed 1 exercise session.  Not enough information at this point to assess progress.  Anticipate that [name] who is committed to getting healthy and strong so that he will not need lung transplant will make progress toward admission goals.    Expected  Outcomes See Admission Goals           ITP Comments:   Comments:  Hajime has completed 2 exercise session in Pulmonary rehab. Pt maintains good attendance and consistent home exercise. Pulmonary rehab staff will  continue to monitor and reassess progress toward goals during her participation in Pulmonary Rehab. Cherre Huger, BSN Cardiac and Training and development officer

## 2020-01-18 NOTE — Progress Notes (Signed)
Daily Session Note  Patient Details  Name: [name] MRN: [id number] Date of Birth: [birth year] Referring Provider:     Pulmonary Rehab Walk Test from 01/08/2020 in [place]  Referring Provider Dr. Olena Heckle      Encounter Date: 01/18/2020  Check In:  Session Check In - 01/18/20 1421      Check-In   Supervising physician immediately available to respond to emergencies [facility] immediately available    Physician(s) Dr. Darrick Meigs    Location [facility]    Staff Present Rosebud Poles, RN, BSN;Lisa Ysidro Evert, RN;Tejon Gracie Hassell Done, MS, ACSM-CEP, Exercise Physiologist    Virtual Visit No    Medication changes reported     No    Fall or balance concerns reported    No    Tobacco Cessation No Change    Warm-up and Cool-down Performed on first and last piece of equipment    Resistance Training Performed Yes    VAD Patient? No    PAD/SET Patient? No      Pain Assessment   Currently in Pain? No/denies    Pain Score 0-No pain    Multiple Pain Sites No           Capillary Blood Glucose: No results found for this or any previous visit (from the past 24 hour(s)).    Social History   Tobacco Use  Smoking Status Never Smoker  Smokeless Tobacco Never Used    Goals Met:  Proper associated with RPD/PD & O2 Sat Exercise tolerated well No report of cardiac concerns or symptoms Strength training completed today  Goals Unmet:  Not Applicable  Comments: Service time is from 1400 to 1508    Dr. Fransico Him is Medical Director for Cardiac Rehab at [facility].

## 2020-01-23 ENCOUNTER — Other Ambulatory Visit: Payer: Self-pay

## 2020-01-23 ENCOUNTER — Encounter (HOSPITAL_COMMUNITY): Payer: PPO

## 2020-01-23 ENCOUNTER — Encounter (HOSPITAL_COMMUNITY)
Admission: RE | Admit: 2020-01-23 | Discharge: 2020-01-23 | Disposition: A | Discharge status: Home / Self Care | Payer: PPO | Source: Ambulatory Visit | Attending: Cardiology | Admitting: Cardiology

## 2020-01-23 VITALS — Wt 183.9 lb

## 2020-01-23 DIAGNOSIS — J471 Bronchiectasis with (acute) exacerbation: Secondary | ICD-10-CM | POA: Diagnosis not present

## 2020-01-23 DIAGNOSIS — J479 Bronchiectasis, uncomplicated: Secondary | ICD-10-CM

## 2020-01-23 DIAGNOSIS — G4733 Obstructive sleep apnea (adult) (pediatric): Secondary | ICD-10-CM | POA: Diagnosis not present

## 2020-01-23 NOTE — Progress Notes (Signed)
Daily Session Note  Patient Details  Name: [name] MRN: [id number] Date of Birth: 12-Jan-1956 Referring Provider:     Pulmonary Rehab Walk Test from 01/08/2020 in [place]  Referring Provider Dr. Olena Heckle      Encounter Date: 01/23/2020  Check In:  Session Check In - 01/23/20 1406      Check-In   Supervising physician immediately available to respond to emergencies [facility] immediately available    Physician(s) Dr. Darrick Meigs    [facility]    Staff Present Rosebud Poles, RN, BSN;Lisa Ysidro Evert, RN;Jessica Hassell Done, MS, ACSM-CEP, Exercise Physiologist    Virtual Visit No    Medication changes reported     No    Fall or balance concerns reported    No    Tobacco Cessation No Change    Warm-up and Cool-down Performed as group-led instruction    Resistance Training Performed Yes    VAD Patient? No    PAD/SET Patient? No      Pain Assessment   Currently in Pain? No/denies    Multiple Pain Sites No           Capillary Blood Glucose: No results found for this or any previous visit (from the past 24 hour(s)).   Exercise Prescription Changes - 01/23/20 1500      Response to Exercise   Blood Pressure (Admit) 114/60    Blood Pressure (Exercise) 100/54    Blood Pressure (Exit) 110/64    Heart Rate (Admit) 105 bpm    Heart Rate (Exercise) 106 bpm    Heart Rate (Exit) 103 bpm    Oxygen Saturation (Admit) 95 %    Oxygen Saturation (Exercise) 93 %    Oxygen Saturation (Exit) 95 %    Rating of Perceived Exertion (Exercise) 14    Perceived Dyspnea (Exercise) 3    Duration Continue with 30 min of aerobic exercise without signs/symptoms of physical distress.    Intensity THRR unchanged      Progression   Progression Continue to progress workloads to maintain intensity without signs/symptoms of physical distress.      Resistance Training   Training Prescription Yes    Weight Orange bands    Reps 10-15    Time 10  Minutes      Oxygen   Oxygen Continuous    Liters 2      NuStep   Level 2    SPM 80    Minutes 15    METs 1.9      Arm Ergometer   Level 1.5    RPM 42    Minutes 15           Social History   Tobacco Use  Smoking Status Never Smoker  Smokeless Tobacco Never Used    Goals Met:  Proper associated with RPD/PD & O2 Sat Exercise tolerated well Strength training completed today  Goals Unmet:  Not Applicable  Comments: Service time is from 1350  to 1505.    Dr. Fransico Him is Medical Director for Cardiac Rehab at [facility].

## 2020-01-25 ENCOUNTER — Other Ambulatory Visit: Payer: Self-pay

## 2020-01-25 ENCOUNTER — Encounter (HOSPITAL_COMMUNITY): Payer: PPO

## 2020-01-25 ENCOUNTER — Encounter (HOSPITAL_COMMUNITY)
Admission: RE | Admit: 2020-01-25 | Discharge: 2020-01-25 | Disposition: A | Discharge status: Home / Self Care | Payer: PPO | Source: Ambulatory Visit | Attending: Cardiology | Admitting: Cardiology

## 2020-01-25 DIAGNOSIS — J479 Bronchiectasis, uncomplicated: Secondary | ICD-10-CM | POA: Diagnosis not present

## 2020-01-25 NOTE — Progress Notes (Signed)
Daily Session Note  Patient Details  Name: [name] MRN: [id number] Date of Birth: 07-02-1955 Referring Provider:     Pulmonary Rehab Walk Test from 01/08/2020 in [place]  Referring Provider Dr. Olena Heckle      Encounter Date: 01/25/2020  Check In:  Session Check In - 01/25/20 1415      Check-In   Supervising physician immediately available to respond to emergencies [facility] immediately available    Physician(s) Dr. Verlon Au    [facility]    Staff Present Rosebud Poles, RN, BSN;Lisa Ysidro Evert, RN;Jessica Hassell Done, MS, ACSM-CEP, Exercise Physiologist    Virtual Visit No    Medication changes reported     No    Fall or balance concerns reported    No    Tobacco Cessation No Change    Warm-up and Cool-down Performed as group-led instruction    Resistance Training Performed Yes    VAD Patient? No    PAD/SET Patient? No      Pain Assessment   Currently in Pain? No/denies    Multiple Pain Sites No           Capillary Blood Glucose: No results found for this or any previous visit (from the past 24 hour(s)).    Social History   Tobacco Use  Smoking Status Never Smoker  Smokeless Tobacco Never Used    Goals Met:  Proper associated with RPD/PD & O2 Sat Exercise tolerated well Strength training completed today  Goals Unmet:  Not Applicable  Comments: Service time is from 9943 to 1500   Dr. Fransico Him is Medical Director for Cardiac Rehab at [facility].

## 2020-01-26 DIAGNOSIS — J479 Bronchiectasis, uncomplicated: Principal | ICD-10-CM

## 2020-01-26 NOTE — Unmapped (Signed)
Faxed prescription and e-faxed note to [facility] for a compressor.  Patient notified via MyChart.

## 2020-01-30 ENCOUNTER — Other Ambulatory Visit: Payer: Self-pay

## 2020-01-30 ENCOUNTER — Encounter (HOSPITAL_COMMUNITY): Payer: PPO

## 2020-01-30 ENCOUNTER — Encounter (HOSPITAL_COMMUNITY)
Admission: RE | Admit: 2020-01-30 | Discharge: 2020-01-30 | Disposition: A | Discharge status: Home / Self Care | Payer: PPO | Source: Ambulatory Visit | Attending: Cardiology | Admitting: Cardiology

## 2020-01-30 DIAGNOSIS — J479 Bronchiectasis, uncomplicated: Secondary | ICD-10-CM | POA: Diagnosis not present

## 2020-01-30 NOTE — Progress Notes (Signed)
[name] 64 y.o. male Nutrition Note  Visit Diagnosis: Bronchiectasis without acute exacerbation [facility])  Past Medical History:  Diagnosis Date  . Bronchiectasis ([place])   . Bronchiolitis   . Dizziness and giddiness 07/26/2012   none recent  . DJD (degenerative joint disease)    back and neck  . Dyspnea    with walking long distances  . Gastroesophageal reflux disease   . History of kidney stones yrs ago   passed on own  . HTN (hypertension)   . OSA (obstructive sleep apnea)    none since 30 lb weight loss  . Pneumonia last time 2017  . Pneumonia with lung abscess ([place])   . PONV (postoperative nausea and vomiting)    likes scopolamine patch, trouble turning neck to left  . Prostate cancer ([place]) dx Aug 12, 2017  . Tear of left rotator cuff    healed on own     Medications reviewed.   Current Outpatient Medications:  .  acidophilus (RISAQUAD) CAPS capsule, Take 2 capsules by mouth daily., Disp: 60 capsule, Rfl: 0 .  albuterol (PROVENTIL HFA;VENTOLIN HFA) 108 (90 Base) MCG/ACT inhaler, Inhale 2 puffs into the lungs every 4 (four) hours as needed for wheezing or shortness of breath. , Disp: , Rfl:  .  albuterol (PROVENTIL) (2.5 MG/3ML) 0.083% nebulizer solution, Inhale 2.5 mg into the lungs 4 (four) times daily. , Disp: , Rfl:  .  ascorbic acid (VITAMIN C) 250 MG CHEW, Chew 1,000 mg by mouth daily. , Disp: , Rfl:  .  azithromycin (ZITHROMAX) 250 MG tablet, Take 1 tablet (250 mg total) by mouth daily., Disp: , Rfl: 0 .  budesonide-formoterol (SYMBICORT) 160-4.5 MCG/ACT inhaler, Inhale 2 puffs into the lungs 2 (two) times daily., Disp: , Rfl:  .  Cholecalciferol 25 MCG (1000 UT) tablet, Take 1,000 Units by mouth daily., Disp: , Rfl:  .  Colistimethate Sodium POWD, Take 2 mLs by nebulization 2 (two) times daily. Use only every other month. 150mg  injection., Disp: , Rfl:  .  dornase alpha (PULMOZYME) 1 MG/ML nebulizer solution, Take 2.5 mg by nebulization daily. Use at least 30-60  minutes before airway clearance or after airway clearance, Disp: , Rfl:  .  doxylamine, Sleep, (UNISOM) 25 MG tablet, Take 25 mg by mouth at bedtime., Disp: , Rfl:  .  ferrous sulfate 325 (65 FE) MG tablet, Take 325 mg by mouth 2 (two) times daily., Disp: , Rfl:  .  fluticasone (FLONASE) 50 MCG/ACT nasal spray, Place 2 sprays into both nostrils daily., Disp: , Rfl:  .  HYDROcodone-acetaminophen (NORCO) 7.5-325 MG tablet, Take 1 tablet by mouth every 6 (six) hours as needed for moderate pain. (Patient taking differently: Take 1 tablet by mouth 3 (three) times daily. ), Disp: 20 tablet, Rfl: 0 .  HYDROcodone-homatropine (HYCODAN) 5-1.5 MG/5ML syrup, Take 5 mLs by mouth every 6 (six) hours as needed for cough. (Patient taking differently: Take 5 mLs by mouth every 4 (four) hours as needed for cough. ), Disp: 240 mL, Rfl: 0 .  montelukast (SINGULAIR) 10 MG tablet, Take 10 mg by mouth at bedtime., Disp: , Rfl:  .  Multiple Vitamin (MULTIVITAMIN WITH MINERALS) TABS tablet, Take 1 tablet by mouth daily., Disp: , Rfl:  .  NON FORMULARY, Use Acapella twice daily for airway clearance. Dx. 494.0. Mucous clearing device., Disp: , Rfl:  .  omeprazole (PRILOSEC) 20 MG capsule, Take 20 mg by mouth 2 (two) times daily. , Disp: , Rfl:  .  Respiratory Therapy Supplies (FLUTTER) DEVI, Use several times daily as needed for congestion/thick mucus, Disp: , Rfl:  .  Sodium Chloride 10 % NEBU, Inhale 5 mLs into the lungs 2 (two) times daily., Disp: , Rfl:  .  tobramycin, PF, ([name]) 300 MG/5ML nebulizer solution, Take 300 mg by nebulization 2 (two) times daily. Use only every other month, Disp: , Rfl:  .  Water For Irrigation, Sterile (STERILE WATER FOR IRRIGATION), 4 mLs daily. Mix 28ml with colistimethate powder to use for nebulization. 28 days on and 28 days off., Disp: , Rfl:    Ht Readings from Last 1 Encounters:  01/08/20 5\' 8"  (1.727 m)     Wt Readings from Last 3 Encounters:  01/23/20 183 lb 13.8 oz (83.4 kg)   10/05/19 170 lb (77.1 kg)  12/08/17 185 lb 6.4 oz (84.1 kg)     There is no height or weight on file to calculate BMI.   Social History   Tobacco Use  Smoking Status Never Smoker  Smokeless Tobacco Never Used      Nutrition Note  Spoke with pt. Nutrition Plan and Nutrition Survey goals reviewed with pt.    Appetite: good Unintentional weight loss: no, went on diet a few years ago. Intentional weight loss and has maintained around 175 lbs since.  Difficulty eating: none - he did have difficulty swallowing but has adjusted and been provided the skills he needs. Protein: Eats protein sources with most meals.  Fluids: Water, coffee once daily, sugary drink <1x/week Meals per day: 3-4 Eats out: 50% of dinners Salt shaker: no Canned/convenience foods: sometimes Label reading: yes  Diet recall:  B atkins shake and coffee L bacon, eggs D meatloaf with veggies OR chicken with veggies OR beef with veggies  He tries to eat a lower carb diet and make healthy diet choices. He has no concerns. He just wants to maintain his weight. No other nutrition related goals.   Pt expressed understanding of the information reviewed.   Nutrition Diagnosis ? Inadequate fiber intake related to lack of previous education about dietary fiber as evidenced by estimated daily fiber intake of <15 g.  Nutrition Intervention ? Pt's individual nutrition plan reviewed with pt. ? Benefits of adopting healthy diet reviewed with Rate My Plate survey ? Continue client-centered nutrition education by RD, as part of interdisciplinary care.  Goal(s) ? Eat more fruits for snacks ? Choose 3 whole grains daily ? Continue eating beans and veggies with dinner  Plan:   Will provide client-centered nutrition education as part of interdisciplinary care  Monitor and evaluate progress toward nutrition goal with team.   Michaele Offer, MS, RDN, LDN

## 2020-01-30 NOTE — Progress Notes (Signed)
Daily Session Note  Patient Details  Name: [name] MRN: [id number] Date of Birth: [birth year] Referring Provider:     Pulmonary Rehab Walk Test from 01/08/2020 in [place]  Referring Provider Dr. Olena Heckle      Encounter Date: 01/30/2020  Check In:  Session Check In - 01/30/20 1435      Check-In   Supervising physician immediately available to respond to emergencies [facility] immediately available    Physician(s) Dr. Verlon Au    Location [facility]    Staff Present Rosebud Poles, RN, Isaac Laud, MS, ACSM-CEP, Exercise Physiologist;Lisa Ysidro Evert, RN    Virtual Visit No    Medication changes reported     No    Fall or balance concerns reported    No    Tobacco Cessation No Change    Warm-up and Cool-down Performed as group-led instruction    Resistance Training Performed Yes    VAD Patient? No    PAD/SET Patient? No      Pain Assessment   Currently in Pain? No/denies    Multiple Pain Sites No           Capillary Blood Glucose: No results found for this or any previous visit (from the past 24 hour(s)).    Social History   Tobacco Use  Smoking Status Never Smoker  Smokeless Tobacco Never Used    Goals Met:  Proper associated with RPD/PD & O2 Sat Independence with exercise equipment Exercise tolerated well Strength training completed today  Goals Unmet:  Not Applicable  Comments: Service time is from 1350 to 1500.    Dr. Fransico Him is Medical Director for Cardiac Rehab at [facility].

## 2020-02-01 ENCOUNTER — Other Ambulatory Visit: Payer: Self-pay

## 2020-02-01 ENCOUNTER — Encounter (HOSPITAL_COMMUNITY)
Admission: RE | Admit: 2020-02-01 | Discharge: 2020-02-01 | Disposition: A | Discharge status: Home / Self Care | Payer: PPO | Source: Ambulatory Visit | Attending: Cardiology | Admitting: Cardiology

## 2020-02-01 ENCOUNTER — Encounter (HOSPITAL_COMMUNITY): Payer: PPO

## 2020-02-01 DIAGNOSIS — G4733 Obstructive sleep apnea (adult) (pediatric): Secondary | ICD-10-CM | POA: Diagnosis not present

## 2020-02-01 DIAGNOSIS — J479 Bronchiectasis, uncomplicated: Secondary | ICD-10-CM

## 2020-02-01 DIAGNOSIS — J471 Bronchiectasis with (acute) exacerbation: Secondary | ICD-10-CM | POA: Diagnosis not present

## 2020-02-01 NOTE — Progress Notes (Signed)
Daily Session Note  Patient Details  Name: [name] MRN: [id number] Date of Birth: [birth year] Referring Provider:     Pulmonary Rehab Walk Test from 01/08/2020 in [place]  Referring Provider Dr. Olena Heckle      Encounter Date: 02/01/2020  Check In:  Session Check In - 02/01/20 1427      Check-In   Supervising physician immediately available to respond to emergencies [facility] immediately available    Physician(s) Dr. Wynelle Cleveland    [facility]    Staff Present Rosebud Poles, RN, Isaac Laud, MS, ACSM-CEP, Exercise Physiologist;Lisa Ysidro Evert, RN    Virtual Visit No    Medication changes reported     No    Fall or balance concerns reported    No    Tobacco Cessation No Change    Warm-up and Cool-down Performed on first and last piece of equipment    Resistance Training Performed Yes    VAD Patient? No    PAD/SET Patient? No      Pain Assessment   Currently in Pain? No/denies    Pain Score 0-No pain    Multiple Pain Sites No           Capillary Blood Glucose: No results found for this or any previous visit (from the past 24 hour(s)).    Social History   Tobacco Use  Smoking Status Never Smoker  Smokeless Tobacco Never Used    Goals Met:  Proper associated with RPD/PD & O2 Sat Exercise tolerated well Strength training completed today  Goals Unmet:  Not Applicable  Comments: Service time is from 0159 to 1500   Dr. Fransico Him is Medical Director for Cardiac Rehab at [facility].

## 2020-02-06 ENCOUNTER — Encounter (HOSPITAL_COMMUNITY)
Admission: RE | Admit: 2020-02-06 | Discharge: 2020-02-06 | Disposition: A | Discharge status: Home / Self Care | Payer: PPO | Source: Ambulatory Visit | Attending: Cardiology | Admitting: Cardiology

## 2020-02-06 ENCOUNTER — Encounter (HOSPITAL_COMMUNITY): Payer: PPO

## 2020-02-06 ENCOUNTER — Other Ambulatory Visit: Payer: Self-pay

## 2020-02-06 VITALS — Wt 185.2 lb

## 2020-02-06 DIAGNOSIS — J479 Bronchiectasis, uncomplicated: Secondary | ICD-10-CM | POA: Diagnosis not present

## 2020-02-06 NOTE — Progress Notes (Signed)
Daily Session Note  Patient Details  Name: [name] MRN: [id number] Date of Birth: November 14, 1955 Referring Provider:     Pulmonary Rehab Walk Test from 01/08/2020 in [place]  Referring Provider Dr. Olena Heckle      Encounter Date: 02/06/2020  Check In:  Session Check In - 02/06/20 1443      Check-In   Supervising physician immediately available to respond to emergencies [facility] immediately available    Physician(s) Dr. Ree Kida    Location [facility]    Staff Present Rosebud Poles, RN, Isaac Laud, MS, ACSM-CEP, Exercise Physiologist;Lisa Ysidro Evert, RN    Virtual Visit No    Medication changes reported     No    Fall or balance concerns reported    No    Tobacco Cessation No Change    Warm-up and Cool-down Performed on first and last piece of equipment    Resistance Training Performed Yes    VAD Patient? No    PAD/SET Patient? No      Pain Assessment   Currently in Pain? No/denies    Pain Score 0-No pain    Multiple Pain Sites No           Capillary Blood Glucose: No results found for this or any previous visit (from the past 24 hour(s)).   Exercise Prescription Changes - 02/06/20 1500      Response to Exercise   Blood Pressure (Admit) 124/74    Blood Pressure (Exercise) 130/70    Blood Pressure (Exit) 116/70    Heart Rate (Admit) 94 bpm    Heart Rate (Exercise) 99 bpm    Heart Rate (Exit) 98 bpm    Oxygen Saturation (Admit) 93 %    Oxygen Saturation (Exercise) 93 %    Oxygen Saturation (Exit) 94 %    Rating of Perceived Exertion (Exercise) 13    Perceived Dyspnea (Exercise) 2    Duration Continue with 30 min of aerobic exercise without signs/symptoms of physical distress.    Intensity THRR unchanged      Progression   Progression Continue to progress workloads to maintain intensity without signs/symptoms of physical distress.      Resistance Training   Training Prescription Yes    Weight  Orange bands    Reps 10-15    Time 10 Minutes      Oxygen   Oxygen Continuous    Liters 2      NuStep   Level 3    SPM 80    Minutes 15    METs 2.1      Arm Ergometer   Level 2    RPM 49    Minutes 15           Social History   Tobacco Use  Smoking Status Never Smoker  Smokeless Tobacco Never Used    Goals Met:  Proper associated with RPD/PD & O2 Sat Exercise tolerated well Strength training completed today  Goals Unmet:  Not Applicable  Comments: Service time is from 1350 to 1500.    Dr. Fransico Him is Medical Director for Cardiac Rehab at [facility].

## 2020-02-08 ENCOUNTER — Encounter (HOSPITAL_COMMUNITY): Payer: PPO

## 2020-02-08 ENCOUNTER — Encounter (HOSPITAL_COMMUNITY)
Admission: RE | Admit: 2020-02-08 | Discharge: 2020-02-08 | Disposition: A | Discharge status: Home / Self Care | Payer: PPO | Source: Ambulatory Visit | Attending: Cardiology | Admitting: Cardiology

## 2020-02-08 ENCOUNTER — Other Ambulatory Visit: Payer: Self-pay

## 2020-02-08 DIAGNOSIS — J479 Bronchiectasis, uncomplicated: Secondary | ICD-10-CM

## 2020-02-08 NOTE — Progress Notes (Signed)
Daily Session Note  Patient Details  Name: [name] MRN: [id number] Date of Birth: July 03, 1955 Referring Provider:     Pulmonary Rehab Walk Test from 01/08/2020 in [place]  Referring Provider Dr. Olena Heckle      Encounter Date: 02/08/2020  Check In:  Session Check In - 02/08/20 1448      Check-In   Supervising physician immediately available to respond to emergencies [facility] immediately available    Physician(s) Dr. Algis Liming    Location [facility]    Staff Present Rosebud Poles, RN, BSN;Lisa Ysidro Evert, RN;Jessica Hassell Done, MS, ACSM-CEP, Exercise Physiologist    Virtual Visit No    Medication changes reported     No    Fall or balance concerns reported    No    Tobacco Cessation No Change    Warm-up and Cool-down Performed on first and last piece of equipment    Resistance Training Performed Yes    VAD Patient? No    PAD/SET Patient? No      Pain Assessment   Currently in Pain? No/denies    Pain Score 0-No pain    Multiple Pain Sites No           Capillary Blood Glucose: No results found for this or any previous visit (from the past 24 hour(s)).    Social History   Tobacco Use  Smoking Status Never Smoker  Smokeless Tobacco Never Used    Goals Met:  Proper associated with RPD/PD & O2 Sat Exercise tolerated well Strength training completed today  Goals Unmet:  Not Applicable  Comments: Service time is from 1350 to 1500.    Dr. Fransico Him is Medical Director for Cardiac Rehab at [facility].

## 2020-02-09 NOTE — Unmapped (Signed)
[facility]   Patient Onboarding/Medication Counseling    Jordan [name] is a 64 y.o. male with bronchiectasis who I am counseling today on continuation of therapy.  I am speaking to the patient.    Was a Nurse, learning disability used for this call? No    Verified patient's date of birth / HIPAA.    Specialty medication(s) to be sent: CF/Pulmonary: -Generic inhaled tobramycin 300mg /54mL inhalation solution      Non-specialty medications/supplies to be sent: [employer]    Medications not needed at this time: n/a         [name] (tobramycin)    The patient declined counseling on medication administration, missed dose instructions, goals of therapy, side effects and monitoring parameters, warnings and precautions, drug/food interactions and storage, handling precautions, and disposal because they have taken the medication previously. The information in the declined sections below are for informational purposes only and was not discussed with patient.     Medication & Administration     Dosage: Inhale 1 ampule (300mg ) via nebulizer every 12 hours cycled 28 days on and 28 days off.     Administration:   ??? Inhale contents of ampule sitting or standing upright and breathing normally through the mouthpiece of the nebulizer until there is no longer any mist being produced.    ??? Usually last medication taken when on several inhaled therapies.    Adherence/Missed dose instructions: If you miss a dose of tobramycin Inhalation and it is 6 hours or less from the time you usually take your dose, then take your dose as soon as you can, then resume your next dose at the usual time. Otherwise skip the dose, and resume at your next scheduled dose.    Goals of Therapy     To treat or control bacterial infection in lungs    Side Effects & Monitoring Parameters     ??? Voice alterations, loss of voice  ??? Throat irritation  ??? Bronchospasm     The following side effects should be reported to the provider:  ??? Tinnitus (ringing of the ears) or hearing loss    Contraindications, Warnings, & Precautions     ??? Ototoxicity    Drug/Food Interactions     ??? Medication list reviewed in Epic. The patient was instructed to inform the care team before taking any new medications or supplements. No drug interactions identified.      Storage, Handling Precautions, & Disposal     ??? Store in the refrigerator.  ??? May be stored at room temperature for up to 28 days.        Current Medications (including OTC/herbals), Comorbidities and Allergies     Current Outpatient Medications   Medication Sig Dispense Refill   ??? albuterol 2.5 mg /3 mL (0.083 %) nebulizer solution Inhale 3 mL (2.5 mg total) by nebulization Two (2) times a day. 540 mL 3   ??? albuterol HFA 90 mcg/actuation inhaler Inhale 2 puffs Four (4) times a day. 25.5 g 3   ??? alcohol swabs (ALCOHOL PREP PADS) PadM Use as directed with inhaled antibiotics 100 each 5   ??? ascorbic acid, vitamin C, (VITAMIN C) 1000 MG tablet Take 1,000 mg by mouth daily.     ??? azithromycin (ZITHROMAX) 250 MG tablet Take 1 tablet (250 mg total) by mouth daily. 90 tablet 3   ??? colistimethate (COLYMYCIN) 150 mg injection Inject 2mL sterile water for injection to mix colistin vial, then draw up 2mL (150mg ) and inhale  2 times a day,28 days on and 28 days off. 60 each 5   ??? dornase alfa (PULMOZYME) 1 mg/mL nebulizer solution Inhale 2.5 mg daily. Use at least 30-60 minutes before airway clearance, or after airway clearance 225 mL 3   ??? empty container Misc USE AS DIRECTED 1 each 2   ??? ferrous sulfate (IRON, FERROUS SULFATE,) 325 (65 FE) MG tablet Take 325 mg by mouth Two (2) times a day. BID     ??? HYDROcodone-acetaminophen (NORCO) 7.5-325 mg per tablet Take 1 tablet by mouth three (3) times a day (at 6am, noon and 6pm).     ??? HYDROcodone-homatropine (HYCODAN) 5-1.5 mg/5 mL syrup Take 5 mL by mouth every four (4) hours as needed for cough. 120 mL 0   ??? montelukast (SINGULAIR) 10 mg tablet Take 1 tablet (10 mg total) by mouth nightly. 90 tablet 3 ??? multivitamin-minerals-lutein Tab Take 1 tablet by mouth daily.      ??? nebulizers (LC PLUS) Misc use with Colistin 1 each 5   ??? nebulizers (LC PLUS) Misc Use as directed with hypertonic saline 10% 3 each 3   ??? omeprazole (PRILOSEC) 20 MG capsule Take 20 mg by mouth Two (2) times a day.      ??? sodium chloride 10 % Nebu Inhale 5 mL by nebulization Two (2) times a day. 900 mL 3   ??? sterile water, PF, Soln Use 2mL to mix Colistin,then add additional 2mL to neb cup with 2mL of mixed colistin.Inhale twice daily 28 days on and 28 days off. 600 mL 5   ??? SYMBICORT 160-4.5 mcg/actuation inhaler INHALE 2 PUFFS INTO THE LUNGS 2 TIMES A DAY. 30.6 g 1   ??? syringe with needle (BD LUER-LOK SYRINGE) 3 mL 20 gauge x 1 1/2 Syrg For use with inhaled antibiotic (Colistin) 60 each 30   ??? syringe with needle (BD LUER-LOK SYRINGE) 3 mL 20 gauge x 1 Syrg For use with inhaled antibiotic (Colistin) 60 each 5   ??? tobramycin, PF, ([name]) 300 mg/5 mL nebulizer solution Inhale 5 mL (300 mg total) by nebulization every twelve (12) hours. 28 days on and 28 days off. 280 mL 5     No current facility-administered medications for this visit.       Allergies   Allergen Reactions   ??? Gentamicin Other (See Comments)     BALANCE ISSUES   ??? Tobramycin Other (See Comments)     ototoxicity   ??? Morphine Nausea And Vomiting       Patient Active Problem List   Diagnosis   ??? Bronchiectasis (CMS-HCC)   ??? Esophagitis   ??? Pseudomonas aeruginosa infection   ??? Daytime somnolence   ??? Memory loss   ??? Hypersomnia with sleep apnea   ??? Essential hypertension   ??? Osteoarthrosis   ??? Dizziness   ??? OSA (obstructive sleep apnea)   ??? Excessive daytime sleepiness   ??? Sleep disturbances   ??? Loculated pleural effusion   ??? Moderate Pharyngeal dysphagia   ??? IDA (iron deficiency anemia)   ??? Disequilibrium   ??? Gastroesophageal reflux disease without esophagitis   ??? Hoarseness   ??? Dyspnea on exertion       Reviewed and up to date in Epic.    Appropriateness of Therapy     Is medication and dose appropriate based on diagnosis? Yes    Prescription has been clinically reviewed: Yes    Baseline Quality of Life Assessment      How  many days over the past month did your bronchiectasis  keep you from your normal activities? For example, brushing your teeth or getting up in the morning. Patient declined to answer    Financial Information     Medication Assistance provided: None Required    Anticipated copay of $549.97 reviewed with patient. Verified delivery address.    Delivery Information     Scheduled delivery date: 11/30    Expected start date: 12/1    Medication will be delivered via UPS to the prescription address in [facility].  This shipment will not require a signature.      Explained the services we provide at [facility] and that each month we would call to set up refills.  Stressed importance of returning phone calls so that we could ensure they receive their medications in time each month.  Informed patient that we should be setting up refills 7-10 days prior to when they will run out of medication.  A pharmacist will reach out to perform a clinical assessment periodically.  Informed patient that a welcome packet and a drug information handout will be sent.      Patient verbalized understanding of the above information as well as how to contact the pharmacy at [phone] option 4 with any questions/concerns.  The pharmacy is open Monday through Friday 8:30am-4:30pm.  A pharmacist is available 24/7 via pager to answer any clinical questions they may have.    Patient Specific Needs     - Does the patient have any physical, cognitive, or cultural barriers? No    - Patient prefers to have medications discussed with  Patient     - Is the patient or caregiver able to read and understand education materials at a high school level or above? Yes    - Patient's primary language is  English     - Is the patient high risk? No    - Does the patient require a Care Management Plan? No     - Does the patient require physician intervention or other additional services (i.e. nutrition, smoking cessation, social work)? No      Julianne Rice  [facility] Specialty Pharmacist

## 2020-02-12 NOTE — Progress Notes (Signed)
Pulmonary Individual Treatment Plan  Patient Details  Name: [name] MRN: [id number] Date of Birth: 24-Feb-1956 Referring Provider:     Pulmonary Rehab Walk Test from 01/08/2020 in [facility]  Referring Provider Dr. Olena Heckle      Initial Encounter Date:    Pulmonary Rehab Walk Test from 01/08/2020 in [facility]  Date 01/08/20      Visit Diagnosis: Bronchiectasis without acute exacerbation ([place])  Patient's Home Medications on Admission:   Current Outpatient Medications:  .  acidophilus (RISAQUAD) CAPS capsule, Take 2 capsules by mouth daily., Disp: 60 capsule, Rfl: 0 .  albuterol (PROVENTIL HFA;VENTOLIN HFA) 108 (90 Base) MCG/ACT inhaler, Inhale 2 puffs into the lungs every 4 (four) hours as needed for wheezing or shortness of breath. , Disp: , Rfl:  .  albuterol (PROVENTIL) (2.5 MG/3ML) 0.083% nebulizer solution, Inhale 2.5 mg into the lungs 4 (four) times daily. , Disp: , Rfl:  .  ascorbic acid (VITAMIN C) 250 MG CHEW, Chew 1,000 mg by mouth daily. , Disp: , Rfl:  .  azithromycin (ZITHROMAX) 250 MG tablet, Take 1 tablet (250 mg total) by mouth daily., Disp: , Rfl: 0 .  budesonide-formoterol (SYMBICORT) 160-4.5 MCG/ACT inhaler, Inhale 2 puffs into the lungs 2 (two) times daily., Disp: , Rfl:  .  Cholecalciferol 25 MCG (1000 UT) tablet, Take 1,000 Units by mouth daily., Disp: , Rfl:  .  Colistimethate Sodium POWD, Take 2 mLs by nebulization 2 (two) times daily. Use only every other month. 150mg  injection., Disp: , Rfl:  .  dornase alpha (PULMOZYME) 1 MG/ML nebulizer solution, Take 2.5 mg by nebulization daily. Use at least 30-60 minutes before airway clearance or after airway clearance, Disp: , Rfl:  .  doxylamine, Sleep, (UNISOM) 25 MG tablet, Take 25 mg by mouth at bedtime., Disp: , Rfl:  .  ferrous sulfate 325 (65 FE) MG tablet, Take 325 mg by mouth 2 (two) times daily., Disp: , Rfl:  .  fluticasone (FLONASE) 50  MCG/ACT nasal spray, Place 2 sprays into both nostrils daily., Disp: , Rfl:  .  HYDROcodone-acetaminophen (NORCO) 7.5-325 MG tablet, Take 1 tablet by mouth every 6 (six) hours as needed for moderate pain. (Patient taking differently: Take 1 tablet by mouth 3 (three) times daily. ), Disp: 20 tablet, Rfl: 0 .  HYDROcodone-homatropine (HYCODAN) 5-1.5 MG/5ML syrup, Take 5 mLs by mouth every 6 (six) hours as needed for cough. (Patient taking differently: Take 5 mLs by mouth every 4 (four) hours as needed for cough. ), Disp: 240 mL, Rfl: 0 .  montelukast (SINGULAIR) 10 MG tablet, Take 10 mg by mouth at bedtime., Disp: , Rfl:  .  Multiple Vitamin (MULTIVITAMIN WITH MINERALS) TABS tablet, Take 1 tablet by mouth daily., Disp: , Rfl:  .  NON FORMULARY, Use Acapella twice daily for airway clearance. Dx. 494.0. Mucous clearing device., Disp: , Rfl:  .  omeprazole (PRILOSEC) 20 MG capsule, Take 20 mg by mouth 2 (two) times daily. , Disp: , Rfl:  .  Respiratory Therapy Supplies (FLUTTER) DEVI, Use several times daily as needed for congestion/thick mucus, Disp: , Rfl:  .  Sodium Chloride 10 % NEBU, Inhale 5 mLs into the lungs 2 (two) times daily., Disp: , Rfl:  .  tobramycin, PF, ([name]) 300 MG/5ML nebulizer solution, Take 300 mg by nebulization 2 (two) times daily. Use only every other month, Disp: , Rfl:  .  Water For Irrigation, Sterile (STERILE WATER FOR  IRRIGATION), 4 mLs daily. Mix 69ml with colistimethate powder to use for nebulization. 28 days on and 28 days off., Disp: , Rfl:   Past Medical History: Past Medical History:  Diagnosis Date  . Bronchiectasis ([place])   . Bronchiolitis   . Dizziness and giddiness 07/26/2012   none recent  . DJD (degenerative joint disease)    back and neck  . Dyspnea    with walking long distances  . Gastroesophageal reflux disease   . History of kidney stones yrs ago   passed on own  . HTN (hypertension)   . OSA (obstructive sleep apnea)    none since 30 lb weight loss    . Pneumonia last time 2017  . Pneumonia with lung abscess ([place])   . PONV (postoperative nausea and vomiting)    likes scopolamine patch, trouble turning neck to left  . Prostate cancer ([place]) dx Aug 12, 2017  . Tear of left rotator cuff    healed on own    Tobacco Use: Social History   Tobacco Use  Smoking Status Never Smoker  Smokeless Tobacco Never Used    Labs: Recent Review Flowsheet Data    Labs for ITP Cardiac and Pulmonary Rehab Latest Ref Rng & Units 12/03/2010 12/06/2010 12/06/2010 05/23/2018   PHART 7.35 - 7.45 7.421 7.377 - -   PCO2ART 35 - 45 mmHg 35.7 47.4(H) - -   HCO3 20.0 - 28.0 mmol/L 22.8 27.9(H) - 24.2   TCO2 0 - 100 mmol/L 23.9 29 27  -   ACIDBASEDEF 0.0 - 2.0 mmol/L 1.0 - - 0.2   O2SAT % 98.0 96.0 - 77.9      Capillary Blood Glucose: No results found for: GLUCAP   Pulmonary Assessment Scores:  Pulmonary Assessment Scores    Row Name 01/08/20 1606         ADL UCSD   ADL Phase Entry     SOB Score total 39       CAT Score   CAT Score 31       mMRC Score   mMRC Score 3           UCSD: Self-administered rating of dyspnea associated with activities of daily living (ADLs) 6-point scale (0 = "not at all" to 5 = "maximal or unable to do because of breathlessness")  Scoring Scores range from 0 to 120.  Minimally important difference is 5 units  CAT: CAT can identify the health impairment of COPD patients and is better correlated with disease progression.  CAT has a scoring range of zero to 40. The CAT score is classified into four groups of low (less than 10), medium (10 - 20), high (21-30) and very high (31-40) based on the impact level of disease on health status. A CAT score over 10 suggests significant symptoms.  A worsening CAT score could be explained by an exacerbation, poor medication adherence, poor inhaler technique, or progression of COPD or comorbid conditions.  CAT MCID is 2 points  mMRC: mMRC (Modified Medical Research Council)  Dyspnea Scale is used to assess the degree of baseline functional disability in patients of respiratory disease due to dyspnea. No minimal important difference is established. A decrease in score of 1 point or greater is considered a positive change.   Pulmonary Function Assessment:   Exercise Target Goals: Exercise Program Goal: Individual exercise prescription set using results from initial 6 min walk test and THRR while considering  patient's activity barriers and safety.   Exercise Prescription Goal:  Initial exercise prescription builds to 30-45 minutes a day of aerobic activity, 2-3 days per week.  Home exercise guidelines will be given to patient during program as part of exercise prescription that the participant will acknowledge.  Activity Barriers & Risk Stratification:  Activity Barriers & Cardiac Risk Stratification - 01/08/20 0956      Activity Barriers & Cardiac Risk Stratification   Activity Barriers Back Problems;Shortness of Breath;Deconditioning;Muscular Weakness           6 Minute Walk:  6 Minute Walk    Row Name 01/08/20 1547         6 Minute Walk   Phase Initial     Distance 1280 feet     Walk Time 6 minutes     # of Rest Breaks 0     MPH 2.42     METS 2.96     RPE 13     Perceived Dyspnea  2     VO2 Peak 10.35     Symptoms No     Resting HR 70 bpm     Resting BP 118/62     Resting Oxygen Saturation  95 %     Exercise Oxygen Saturation  during 6 min walk 92 %     Max Ex. HR 98 bpm     Max Ex. BP 118/60     2 Minute Post BP 112/60       Interval HR   1 Minute HR 89     2 Minute HR 90     3 Minute HR 96     4 Minute HR 96     5 Minute HR 96     6 Minute HR 98     2 Minute Post HR 96     Interval Heart Rate? Yes       Interval Oxygen   Interval Oxygen? Yes     Baseline Oxygen Saturation % 95 %     1 Minute Oxygen Saturation % 97 %     1 Minute Liters of Oxygen 2 L     2 Minute Oxygen Saturation % 97 %     2 Minute Liters of Oxygen 2 L      3 Minute Oxygen Saturation % 97 %     3 Minute Liters of Oxygen 2 L     4 Minute Oxygen Saturation % 98 %     4 Minute Liters of Oxygen 2 L     5 Minute Oxygen Saturation % 94 %     5 Minute Liters of Oxygen 2 L     6 Minute Oxygen Saturation % 92 %     6 Minute Liters of Oxygen 2 L     2 Minute Post Oxygen Saturation % 96 %     2 Minute Post Liters of Oxygen 2 L            Oxygen Initial Assessment:  Oxygen Initial Assessment - 01/08/20 1544      Home Oxygen   Home Oxygen Device Portable Concentrator;Home Concentrator    Sleep Oxygen Prescription Continuous    Liters per minute 2    Home Exercise Oxygen Prescription Continuous    Liters per minute 2    Home Resting Oxygen Prescription None    Compliance with Home Oxygen Use Yes      Initial 6 min Walk   Oxygen Used Continuous    Liters per minute 2      Program  Oxygen Prescription   Program Oxygen Prescription Continuous    Liters per minute 2      Intervention   Short Term Goals To learn and exhibit compliance with exercise, home and travel O2 prescription;To learn and understand importance of monitoring SPO2 with pulse oximeter and demonstrate accurate use of the pulse oximeter.;To learn and understand importance of maintaining oxygen saturations>88%;To learn and demonstrate proper pursed lip breathing techniques or other breathing techniques.;To learn and demonstrate proper use of respiratory medications    Long  Term Goals Exhibits compliance with exercise, home and travel O2 prescription;Verbalizes importance of monitoring SPO2 with pulse oximeter and return demonstration;Maintenance of O2 saturations>88%;Exhibits proper breathing techniques, such as pursed lip breathing or other method taught during program session;Compliance with respiratory medication;Demonstrates proper use of MDI's           Oxygen Re-Evaluation:  Oxygen Re-Evaluation    Row Name 01/18/20 0809 02/08/20 0807           Program Oxygen  Prescription   Program Oxygen Prescription Continuous Continuous      Liters per minute 2 2        Home Oxygen   Home Oxygen Device Portable Concentrator;Home Concentrator Portable Concentrator;Home Concentrator      Sleep Oxygen Prescription Continuous Continuous      Liters per minute 2 2      Home Exercise Oxygen Prescription Continuous Continuous      Liters per minute 2 2      Home Resting Oxygen Prescription None None      Compliance with Home Oxygen Use Yes Yes        Goals/Expected Outcomes   Short Term Goals To learn and exhibit compliance with exercise, home and travel O2 prescription;To learn and understand importance of monitoring SPO2 with pulse oximeter and demonstrate accurate use of the pulse oximeter.;To learn and understand importance of maintaining oxygen saturations>88%;To learn and demonstrate proper pursed lip breathing techniques or other breathing techniques.;To learn and demonstrate proper use of respiratory medications To learn and exhibit compliance with exercise, home and travel O2 prescription;To learn and understand importance of monitoring SPO2 with pulse oximeter and demonstrate accurate use of the pulse oximeter.;To learn and understand importance of maintaining oxygen saturations>88%;To learn and demonstrate proper pursed lip breathing techniques or other breathing techniques.;To learn and demonstrate proper use of respiratory medications      Long  Term Goals Exhibits compliance with exercise, home and travel O2 prescription;Verbalizes importance of monitoring SPO2 with pulse oximeter and return demonstration;Maintenance of O2 saturations>88%;Exhibits proper breathing techniques, such as pursed lip breathing or other method taught during program session;Compliance with respiratory medication;Demonstrates proper use of MDI's Exhibits compliance with exercise, home and travel O2 prescription;Verbalizes importance of monitoring SPO2 with pulse oximeter and return  demonstration;Maintenance of O2 saturations>88%;Exhibits proper breathing techniques, such as pursed lip breathing or other method taught during program session;Compliance with respiratory medication;Demonstrates proper use of MDI's      Goals/Expected Outcomes compliance and understanding of oxygen saturation and pursed lip breathing compliance and understanding of oxygen saturation and pursed lip breathing             Oxygen Discharge (Final Oxygen Re-Evaluation):  Oxygen Re-Evaluation - 02/08/20 0807      Program Oxygen Prescription   Program Oxygen Prescription Continuous    Liters per minute 2      Home Oxygen   Home Oxygen Device Portable Concentrator;Home Concentrator    Sleep Oxygen Prescription Continuous    Liters per  minute 2    Home Exercise Oxygen Prescription Continuous    Liters per minute 2    Home Resting Oxygen Prescription None    Compliance with Home Oxygen Use Yes      Goals/Expected Outcomes   Short Term Goals To learn and exhibit compliance with exercise, home and travel O2 prescription;To learn and understand importance of monitoring SPO2 with pulse oximeter and demonstrate accurate use of the pulse oximeter.;To learn and understand importance of maintaining oxygen saturations>88%;To learn and demonstrate proper pursed lip breathing techniques or other breathing techniques.;To learn and demonstrate proper use of respiratory medications    Long  Term Goals Exhibits compliance with exercise, home and travel O2 prescription;Verbalizes importance of monitoring SPO2 with pulse oximeter and return demonstration;Maintenance of O2 saturations>88%;Exhibits proper breathing techniques, such as pursed lip breathing or other method taught during program session;Compliance with respiratory medication;Demonstrates proper use of MDI's    Goals/Expected Outcomes compliance and understanding of oxygen saturation and pursed lip breathing           Initial Exercise  Prescription:  Initial Exercise Prescription - 01/11/20 0800      Date of Initial Exercise RX and Referring Provider   Date 01/08/20    Referring Provider Dr. Olena Heckle    Expected Discharge Date 03/14/20      Oxygen   Oxygen Continuous    Liters 2      NuStep   Level 1    Minutes 15      Arm Ergometer   Level 1    Minutes 15      Prescription Details   Frequency (times per week) 2    Duration Progress to 30 minutes of continuous aerobic without signs/symptoms of physical distress      Intensity   THRR 40-80% of Max Heartrate 62-124    Ratings of Perceived Exertion 11-13    Perceived Dyspnea 0-4      Progression   Progression Continue to progress workloads to maintain intensity without signs/symptoms of physical distress.      Resistance Training   Training Prescription Yes    Weight orange bands    Reps 10-15           Perform Capillary Blood Glucose checks as needed.  Exercise Prescription Changes:  Exercise Prescription Changes    Row Name 01/18/20 0800 01/23/20 1500 02/06/20 1500         Response to Exercise   Blood Pressure (Admit) 116/66 114/60 124/74     Blood Pressure (Exercise) 122/60 100/54 130/70     Blood Pressure (Exit) 108/70 110/64 116/70     Heart Rate (Admit) 106 bpm 105 bpm 94 bpm     Heart Rate (Exercise) 105 bpm 106 bpm 99 bpm     Heart Rate (Exit) 99 bpm 103 bpm 98 bpm     Oxygen Saturation (Admit) 90 % 95 % 93 %     Oxygen Saturation (Exercise) 92 % 93 % 93 %     Oxygen Saturation (Exit) 94 % 95 % 94 %     Rating of Perceived Exertion (Exercise) 13 14 13      Perceived Dyspnea (Exercise) 3 3 2      Duration Continue with 30 min of aerobic exercise without signs/symptoms of physical distress. Continue with 30 min of aerobic exercise without signs/symptoms of physical distress. Continue with 30 min of aerobic exercise without signs/symptoms of physical distress.     Intensity Other (comment)  40%-80% HR Max THRR unchanged THRR  unchanged         Progression   Progression Continue to progress workloads to maintain intensity without signs/symptoms of physical distress. Continue to progress workloads to maintain intensity without signs/symptoms of physical distress. Continue to progress workloads to maintain intensity without signs/symptoms of physical distress.     Average METs 2 -- --       Resistance Training   Training Prescription Yes Yes Yes     Weight Orange bands Orange bands Orange bands     Reps 10-15 10-15 10-15     Time 10 Minutes 10 Minutes 10 Minutes       Oxygen   Oxygen Continuous Continuous Continuous     Liters 2 2 2        NuStep   Level 2 2 3      SPM 80 80 80     Minutes 15 15 15      METs 2.1 1.9 2.1       Arm Ergometer   Level 1 1.5 2     RPM 46 42 49     Minutes 15 15 15             Exercise Comments:  Exercise Comments    Row Name 01/16/20 0804           Exercise Comments Pt completed first day of exercise and tolerated well with no complaints              Exercise Goals and Review:  Exercise Goals    Row Name 01/08/20 4259 01/08/20 1553           Exercise Goals   Increase Physical Activity Yes Yes      Intervention Provide advice, education, support and counseling about physical activity/exercise needs.;Develop an individualized exercise prescription for aerobic and resistive training based on initial evaluation findings, risk stratification, comorbidities and participant's personal goals. Provide advice, education, support and counseling about physical activity/exercise needs.;Develop an individualized exercise prescription for aerobic and resistive training based on initial evaluation findings, risk stratification, comorbidities and participant's personal goals.      Expected Outcomes Short Term: Attend rehab on a regular basis to increase amount of physical activity.;Long Term: Add in home exercise to make exercise part of routine and to increase amount of physical  activity.;Long Term: Exercising regularly at least 3-5 days a week. Short Term: Attend rehab on a regular basis to increase amount of physical activity.;Long Term: Add in home exercise to make exercise part of routine and to increase amount of physical activity.;Long Term: Exercising regularly at least 3-5 days a week.      Increase Strength and Stamina Yes Yes      Intervention Provide advice, education, support and counseling about physical activity/exercise needs.;Develop an individualized exercise prescription for aerobic and resistive training based on initial evaluation findings, risk stratification, comorbidities and participant's personal goals. Provide advice, education, support and counseling about physical activity/exercise needs.;Develop an individualized exercise prescription for aerobic and resistive training based on initial evaluation findings, risk stratification, comorbidities and participant's personal goals.      Expected Outcomes Short Term: Increase workloads from initial exercise prescription for resistance, speed, and METs.;Short Term: Perform resistance training exercises routinely during rehab and add in resistance training at home;Long Term: Improve cardiorespiratory fitness, muscular endurance and strength as measured by increased METs and functional capacity (6MWT) Short Term: Increase workloads from initial exercise prescription for resistance, speed, and METs.;Short Term: Perform resistance training exercises routinely during rehab and add in resistance  training at home;Long Term: Improve cardiorespiratory fitness, muscular endurance and strength as measured by increased METs and functional capacity (6MWT)      Able to understand and use rate of perceived exertion (RPE) scale Yes Yes      Intervention Provide education and explanation on how to use RPE scale Provide education and explanation on how to use RPE scale      Expected Outcomes Short Term: Able to use RPE daily in rehab  to express subjective intensity level;Long Term:  Able to use RPE to guide intensity level when exercising independently Short Term: Able to use RPE daily in rehab to express subjective intensity level;Long Term:  Able to use RPE to guide intensity level when exercising independently      Able to understand and use Dyspnea scale Yes Yes      Intervention Provide education and explanation on how to use Dyspnea scale Provide education and explanation on how to use Dyspnea scale      Expected Outcomes Short Term: Able to use Dyspnea scale daily in rehab to express subjective sense of shortness of breath during exertion;Long Term: Able to use Dyspnea scale to guide intensity level when exercising independently Short Term: Able to use Dyspnea scale daily in rehab to express subjective sense of shortness of breath during exertion;Long Term: Able to use Dyspnea scale to guide intensity level when exercising independently      Knowledge and understanding of Target Heart Rate Range (THRR) Yes Yes      Intervention Provide education and explanation of THRR including how the numbers were predicted and where they are located for reference Provide education and explanation of THRR including how the numbers were predicted and where they are located for reference      Expected Outcomes Short Term: Able to state/look up THRR;Long Term: Able to use THRR to govern intensity when exercising independently;Short Term: Able to use daily as guideline for intensity in rehab Short Term: Able to state/look up THRR;Long Term: Able to use THRR to govern intensity when exercising independently;Short Term: Able to use daily as guideline for intensity in rehab      Understanding of Exercise Prescription Yes Yes      Intervention Provide education, explanation, and written materials on patient's individual exercise prescription Provide education, explanation, and written materials on patient's individual exercise prescription       Expected Outcomes Short Term: Able to explain program exercise prescription;Long Term: Able to explain home exercise prescription to exercise independently Short Term: Able to explain program exercise prescription;Long Term: Able to explain home exercise prescription to exercise independently             Exercise Goals Re-Evaluation :  Exercise Goals Re-Evaluation    Row Name 01/18/20 6301 02/08/20 0804           Exercise Goal Re-Evaluation   Exercise Goals Review Increase Physical Activity;Increase Strength and Stamina;Able to understand and use rate of perceived exertion (RPE) scale;Able to understand and use Dyspnea scale;Knowledge and understanding of Target Heart Rate Range (THRR);Understanding of Exercise Prescription Increase Physical Activity;Increase Strength and Stamina;Able to understand and use rate of perceived exertion (RPE) scale;Able to understand and use Dyspnea scale;Knowledge and understanding of Target Heart Rate Range (THRR);Understanding of Exercise Prescription      Comments Pt has completed 1 exercise session and tolerated well. He is exercising at 2.1 METS on the Nustep and 46 RPM's on the Arm Ergometer. Will continue to monitor and progress as able Pt  has completed 7 exercise sessions and has been making steady progressions with workloads and METS. He is exercising at 2.1 METS on the Nustep and 49 WATTS on the Arm Ergometer. Will continue to monitor and progress as he is able.      Expected Outcomes Through exercise at rehab and home the patient will decrease shortness of breath with daily activities and feel confident in carrying out an exercise regimn at home. Through exercise at rehab and home the patient will decrease shortness of breath with daily activities and feel confident in carrying out an exercise regimn at home.             Discharge Exercise Prescription (Final Exercise Prescription Changes):  Exercise Prescription Changes - 02/06/20 1500      Response  to Exercise   Blood Pressure (Admit) 124/74    Blood Pressure (Exercise) 130/70    Blood Pressure (Exit) 116/70    Heart Rate (Admit) 94 bpm    Heart Rate (Exercise) 99 bpm    Heart Rate (Exit) 98 bpm    Oxygen Saturation (Admit) 93 %    Oxygen Saturation (Exercise) 93 %    Oxygen Saturation (Exit) 94 %    Rating of Perceived Exertion (Exercise) 13    Perceived Dyspnea (Exercise) 2    Duration Continue with 30 min of aerobic exercise without signs/symptoms of physical distress.    Intensity THRR unchanged      Progression   Progression Continue to progress workloads to maintain intensity without signs/symptoms of physical distress.      Resistance Training   Training Prescription Yes    Weight Orange bands    Reps 10-15    Time 10 Minutes      Oxygen   Oxygen Continuous    Liters 2      NuStep   Level 3    SPM 80    Minutes 15    METs 2.1      Arm Ergometer   Level 2    RPM 49    Minutes 15           Nutrition:  Target Goals: Understanding of nutrition guidelines, daily intake of sodium 1500mg , cholesterol 200mg , calories 30% from fat and 7% or less from saturated fats, daily to have 5 or more servings of fruits and vegetables.  Biometrics:  Pre Biometrics - 01/08/20 1554      Pre Biometrics   Grip Strength 19 kg            Nutrition Therapy Plan and Nutrition Goals:  Nutrition Therapy & Goals - 01/30/20 1455      Nutrition Therapy   Diet Generally healthful      Personal Nutrition Goals   Nutrition Goal Eat more fruits for snacks    Personal Goal #2 Choose 3 whole grains daily    Personal Goal #3 Continue eating beans and veggies with dinner      Intervention Plan   Intervention Prescribe, educate and counsel regarding individualized specific dietary modifications aiming towards targeted core components such as weight, hypertension, lipid management, diabetes, heart failure and other comorbidities.    Expected Outcomes Short Term Goal:  Understand basic principles of dietary content, such as calories, fat, sodium, cholesterol and nutrients.           Nutrition Assessments:  Nutrition Assessments - 01/17/20 1340      Rate Your Plate Scores   Pre Score 43  MEDIFICTS Score Key:  ?70 Need to make dietary changes   40-70 Heart Healthy Diet  ? 40 Therapeutic Level Cholesterol Diet   Picture Your Plate Scores:  <73 Unhealthy dietary pattern with much room for improvement.  41-50 Dietary pattern unlikely to meet recommendations for good health and room for improvement.  51-60 More healthful dietary pattern, with some room for improvement.   >60 Healthy dietary pattern, although there may be some specific behaviors that could be improved.    Nutrition Goals Re-Evaluation:  Nutrition Goals Re-Evaluation    [place] Name 01/30/20 1456             Goals   Current Weight 183 lb (83 kg)       Nutrition Goal Eat more fruits for snacks         Personal Goal #2 Re-Evaluation   Personal Goal #2 Choose 3 whole grains daily         Personal Goal #3 Re-Evaluation   Personal Goal #3 Continue eating beans and veggies with dinner              Nutrition Goals Discharge (Final Nutrition Goals Re-Evaluation):  Nutrition Goals Re-Evaluation - 01/30/20 1456      Goals   Current Weight 183 lb (83 kg)    Nutrition Goal Eat more fruits for snacks      Personal Goal #2 Re-Evaluation   Personal Goal #2 Choose 3 whole grains daily      Personal Goal #3 Re-Evaluation   Personal Goal #3 Continue eating beans and veggies with dinner           Psychosocial: Target Goals: Acknowledge presence or absence of significant depression and/or stress, maximize coping skills, provide positive support system. Participant is able to verbalize types and ability to use techniques and skills needed for reducing stress and depression.  Initial Review & Psychosocial Screening:  Initial Psych Review & Screening - 01/08/20  1010      Initial Review   Current issues with None Identified      Family Dynamics   Good Support System? Yes      Barriers   Psychosocial barriers to participate in program There are no identifiable barriers or psychosocial needs.;The patient should benefit from training in stress management and relaxation.      Screening Interventions   Interventions Encouraged to exercise;Provide feedback about the scores to participant           Quality of Life Scores:  Scores of 19 and below usually indicate a poorer quality of life in these areas.  A difference of  2-3 points is a clinically meaningful difference.  A difference of 2-3 points in the total score of the Quality of Life Index has been associated with significant improvement in overall quality of life, self-image, physical symptoms, and general health in studies assessing change in quality of life.  PHQ-9: Recent Review Flowsheet Data    Depression screen [facility] 2/9 01/08/2020 01/08/2020   Decreased Interest 0 0   Down, Depressed, Hopeless 0 0   PHQ - 2 Score 0 0   Altered sleeping 0 -   Tired, decreased energy 3 -   Change in appetite 0 -   Feeling bad or failure about yourself  0 -   Trouble concentrating 0 -   Moving slowly or fidgety/restless 0 -   Suicidal thoughts 0 -   PHQ-9 Score 3 -   Difficult doing work/chores Somewhat difficult -  Interpretation of Total Score  Total Score Depression Severity:  1-4 = Minimal depression, 5-9 = Mild depression, 10-14 = Moderate depression, 15-19 = Moderately severe depression, 20-27 = Severe depression   Psychosocial Evaluation and Intervention:  Psychosocial Evaluation - 01/17/20 1132      Psychosocial Evaluation & Interventions   Expected Outcomes Jovany will continue to display hopeful and positive outlook on life with no psychosocial barriers.           Psychosocial Re-Evaluation:  Psychosocial Re-Evaluation    Row Name 01/17/20 1129 01/17/20 1132 02/12/20 1342          Psychosocial Re-Evaluation   Current issues with None Identified -- None Identified     Comments Alphonse has strong faith foundations with supportive church family. -- No psychosocial concerns identified at this time.     Expected Outcomes -- Karel will continue to display hopeful and positive outlook on life with no psychosocial barriers. For Ford to be free of psychosocial concerns while participating in pulmonary rehab.     Interventions -- Encouraged to attend Pulmonary Rehabilitation for the exercise --     Continue Psychosocial Services  -- No Follow up required No Follow up required            Psychosocial Discharge (Final Psychosocial Re-Evaluation):  Psychosocial Re-Evaluation - 02/12/20 1342      Psychosocial Re-Evaluation   Current issues with None Identified    Comments No psychosocial concerns identified at this time.    Expected Outcomes For Moises to be free of psychosocial concerns while participating in pulmonary rehab.    Continue Psychosocial Services  No Follow up required           Education: Education Goals: Education classes will be provided on a weekly basis, covering required topics. Participant will state understanding/return demonstration of topics presented.  Learning Barriers/Preferences:  Learning Barriers/Preferences - 01/08/20 1011      Learning Barriers/Preferences   Learning Preferences Group Instruction;Computer/Internet;Individual Instruction;Verbal Instruction;Video;Written Material           Education Topics: Risk Factor Reduction:  -Group instruction that is supported by a PowerPoint presentation. Instructor discusses the definition of a risk factor, different risk factors for pulmonary disease, and how the heart and lungs work together.     PULMONARY REHAB OTHER RESPIRATORY from 02/01/2020 in [place]  Date 01/18/20  Educator Handout  Instruction Review Code 1- Verbalizes Understanding       Nutrition for Pulmonary Patient:  -Group instruction provided by PowerPoint slides, verbal discussion, and written materials to support subject matter. The instructor gives an explanation and review of healthy diet recommendations, which includes a discussion on weight management, recommendations for fruit and vegetable consumption, as well as protein, fluid, caffeine, fiber, sodium, sugar, and alcohol. Tips for eating when patients are short of breath are discussed.   Pursed Lip Breathing:  -Group instruction that is supported by demonstration and informational handouts. Instructor discusses the benefits of pursed lip and diaphragmatic breathing and detailed demonstration on how to preform both.     Oxygen Safety:  -Group instruction provided by PowerPoint, verbal discussion, and written material to support subject matter. There is an overview of "What is Oxygen" and "Why do we need it".  Instructor also reviews how to create a safe environment for oxygen use, the importance of using oxygen as prescribed, and the risks of noncompliance. There is a brief discussion on traveling with oxygen and resources the patient may utilize.  Oxygen Equipment:  -Group instruction provided by [facility] Staff utilizing handouts, written materials, and equipment demonstrations.   Signs and Symptoms:  -Group instruction provided by written material and verbal discussion to support subject matter. Warning signs and symptoms of infection, stroke, and heart attack are reviewed and when to call the physician/911 reinforced. Tips for preventing the spread of infection discussed.   Advanced Directives:  -Group instruction provided by verbal instruction and written material to support subject matter. Instructor reviews Advanced Directive laws and proper instruction for filling out document.   Pulmonary Video:  -Group video education that reviews the importance of medication and oxygen compliance, exercise,  good nutrition, pulmonary hygiene, and pursed lip and diaphragmatic breathing for the pulmonary patient.   Exercise for the Pulmonary Patient:  -Group instruction that is supported by a PowerPoint presentation. Instructor discusses benefits of exercise, core components of exercise, frequency, duration, and intensity of an exercise routine, importance of utilizing pulse oximetry during exercise, safety while exercising, and options of places to exercise outside of rehab.     PULMONARY REHAB OTHER RESPIRATORY from 02/01/2020 in [place]  Date 01/25/20  Educator handout      Pulmonary Medications:  -Verbally interactive group education provided by instructor with focus on inhaled medications and proper administration.   Anatomy and Physiology of the Respiratory System and Intimacy:  -Group instruction provided by PowerPoint, verbal discussion, and written material to support subject matter. Instructor reviews respiratory cycle and anatomical components of the respiratory system and their functions. Instructor also reviews differences in obstructive and restrictive respiratory diseases with examples of each. Intimacy, Sex, and Sexuality differences are reviewed with a discussion on how relationships can change when diagnosed with pulmonary disease. Common sexual concerns are reviewed.   MD DAY -A group question and answer session with a medical doctor that allows participants to ask questions that relate to their pulmonary disease state.   OTHER EDUCATION -Group or individual verbal, written, or video instructions that support the educational goals of the pulmonary rehab program.   PULMONARY REHAB OTHER RESPIRATORY from 02/01/2020 in [place]  Date 02/01/20  Educator handout  [Beat a Sedentary Lifestyls]      Holiday Eating Survival Tips:  -Group instruction provided by PowerPoint slides, verbal discussion, and written  materials to support subject matter. The instructor gives patients tips, tricks, and techniques to help them not only survive but enjoy the holidays despite the onslaught of food that accompanies the holidays.   Knowledge Questionnaire Score:  Knowledge Questionnaire Score - 01/08/20 1559      Knowledge Questionnaire Score   Pre Score 17/18           Core Components/Risk Factors/Patient Goals at Admission:  Personal Goals and Risk Factors at Admission - 01/08/20 1014      Core Components/Risk Factors/Patient Goals on Admission    Weight Management Weight Maintenance    Improve shortness of breath with ADL's Yes    Intervention Provide education, individualized exercise plan and daily activity instruction to help decrease symptoms of SOB with activities of daily living.    Expected Outcomes Short Term: Improve cardiorespiratory fitness to achieve a reduction of symptoms when performing ADLs;Long Term: Be able to perform more ADLs without symptoms or delay the onset of symptoms           Core Components/Risk Factors/Patient Goals Review:   Goals and Risk Factor Review    Row Name 01/17/20 1132 02/12/20  1344           Core Components/Risk Factors/Patient Goals Review   Personal Goals Review Weight Management/Obesity;Develop more efficient breathing techniques such as purse lipped breathing and diaphragmatic breathing and practicing self-pacing with activity.;Improve shortness of breath with ADL's;Increase knowledge of respiratory medications and ability to use respiratory devices properly. Weight Management/Obesity;Increase knowledge of respiratory medications and ability to use respiratory devices properly.;Improve shortness of breath with ADL's;Develop more efficient breathing techniques such as purse lipped breathing and diaphragmatic breathing and practicing self-pacing with activity.      Review Aidyn completed 1 exercise session.  Not enough information at this point to assess  progress.  Anticipate that Saburo who is committed to getting healthy and strong so that he will not need lung transplant will make progress toward admission goals. Sunil cannot see a major improvement in his strength, stamina, or shortness of breath, but he is not exercising outside of pulmonary rehab.      Expected Outcomes See Admission Goals That Kael will see an improvement in his strength, stamina, and shortness of breath in the next 30 days and that he will exercise at home 2-3 days/week in addition to exercising in pulmonary rehab.             Core Components/Risk Factors/Patient Goals at Discharge (Final Review):   Goals and Risk Factor Review - 02/12/20 1344      Core Components/Risk Factors/Patient Goals Review   Personal Goals Review Weight Management/Obesity;Increase knowledge of respiratory medications and ability to use respiratory devices properly.;Improve shortness of breath with ADL's;Develop more efficient breathing techniques such as purse lipped breathing and diaphragmatic breathing and practicing self-pacing with activity.    Review Zian cannot see a major improvement in his strength, stamina, or shortness of breath, but he is not exercising outside of pulmonary rehab.    Expected Outcomes That Lovel will see an improvement in his strength, stamina, and shortness of breath in the next 30 days and that he will exercise at home 2-3 days/week in addition to exercising in pulmonary rehab.           ITP Comments:   Comments: ITP REVIEW Pt is making expected progress toward pulmonary rehab goals after completing 8 sessions. Recommend continued exercise, life style modification, education, and utilization of breathing techniques to increase stamina and strength and decrease shortness of breath with exertion.

## 2020-02-12 NOTE — Unmapped (Addendum)
Adding Spiriva to inhalers to help open up the airways.    Prednisone 20 mg daily for 5 days.    Looking into getting ceftazidime nebulized for February.    Checking blood work for allergies.    Referral placed for [facility].    Will request your sleep study.    Symptoms of a bronchiectasis exacerbation: 48 hours of at least three of the following:  ?? Increased cough  ?? Change in volume or appearance of sputum  ?? Increased sputum purulence  ?? Worsening shortness of breath and/or exercise tolerance  ?? Fatigue and/or malaise  ?? Coughing up blood (hemoptysis)    If you are experiencing some of these symptoms:  ?? Increase the frequency and/or intensity of your airway clearance  ?? Try to submit a sputum sample for bacterial and AFB cultures (at [facility] or locally)  ?? Reach out to me or your local physician.  ?? If you need antibiotics, recommend treating for 14 days.      Please bring any new airway clearance equipment to your next visit to ensure that you are using and caring for it properly.    Information about the COVID-19 vaccines: yourshot.[email or web address]    Interested in participating in COVID-19 vaccine trials? https://www.coronaviruspreventionnetwork.org    Thank you for allowing me to be a part of your care. Please call the clinic with any questions.    Viona Gilmore, MD, MPH  Pulmonary and Critical Care Medicine  [address]  [id number]  [place], [place] [address]    Thank you for your visit to the [facility]. You may receive a survey from [facility] regarding your visit today, and we are eager to use this feedback to improve your experience. Thank you for taking the time to fill it out.    Between appointments, you can reach [place] at these numbers:  ??? For appointments: [phone]  ??? For my nurse, Arsenio Loader RN: [phone]  ??? Fax: [phone]  ??? For urgent issues after hours: Hospital Operator @ [phone] & ask for Pulmonary Fellow on call    For further information, check out the websites below:    [facility] for Bronchiectasis: [email or web address]    Impact Airway Clearance Education: http://www.impact-be.com    Videos from the patient session of the World Bronchiectasis Conference in [place] on October 03, 2016 can be watched here (TrustyNews.es).     Bronchiectasis Toolbox (information about airway clearance): DiningCalendar.de    Copy for Individuals with Bronchiectasis and/or NTM through the Bronchiectasis Registry and the COPD Foundation: https://www.mckee-young.org/    Information about Non-tuberculous Mycobacterial Infections:  https://www.ntminfo.org     Interested in clinical trials and other research opportunities?  www.clinicaltrials.gov    The Rare Diseases Clinical Research Network [facility]) Genetic Disorders of  Mucociliary Clearance Consortium [facility]) Contact Registry is a way for patients with disorders of mucociliary clearance (such as bronchiectasis) and their family members to learn about research studies they may be able to join. Participation is completely voluntary and you may choose to withdraw at any time. There is no cost to join the [employer]. For more information or to join the registry please go to the following website:  [email or web address]

## 2020-02-12 NOTE — Unmapped (Signed)
Referral and face sheet was faxed to [facility] today per Dr. Garner Nash request.     Records found in Care Everywhere.     Fax: [phone]    Email fax confirmation received.

## 2020-02-12 NOTE — Unmapped (Signed)
[facility]  [facility]    02/13/20    9:11 AM  -  Assessment:      Patient:Jordan [name] ([birth year])    Jordan [name] is a 64 y.o. male who is seen for follow up of bronchiectasis with chronic Pseudomonas colonization and chronic hypoxemic respiratory failure requiring supplemental oxygen with exertion and sleep.[name] He is reporting increased chest tightness and less sputum production despite excellent compliance with his airway clearance and inhaled therapies.     Plan:      Problem List Items Addressed This Visit        Respiratory    Bronchiectasis (CMS-HCC) - Primary    Relevant Medications    predniSONE (DELTASONE) 20 MG tablet    tiotropium bromide (SPIRIVA RESPIMAT) 2.5 mcg/actuation inhalation mist    Other Relevant Orders    CBC w/ Differential (Completed)    IgE Total (Completed)    AFB SMEAR (Completed)    Chronic respiratory failure with hypoxia (CMS-HCC)    Relevant Medications    tiotropium bromide (SPIRIVA RESPIMAT) 2.5 mcg/actuation inhalation mist       Other    Pseudomonas aeruginosa infection        Reinforced importance of regular airway clearance.  In bronchiectasis, there is impaired mucus clearance from both the airway changes and from the underlying cause of the bronchiectasis.  This impaired clearance leads to accumulation of mucus, resulting in inflammation, accumulation of bacteria, and worsening damage to the airways.  Therefore, augmentation of airway clearance is critical. Jordan [name] was provided websites with information about bronchiectasis, social forums for patients with bronchiectasis and NTM, and airway clearance techniques    Patient met with the respiratory therapist today to discuss home oxygen equipment. No changes were made to airway clearance regimen.  The following changes were made to the inhaled medications: Added Spiriva Respimat to inhaled therapy to optimize bronchodilation. Looking into obtaining ceftazidime IV solution to nebulize since unable to get insurance coverage for [name] or [place].     Sent prescription for prednisone 20 mg oral daily for 5 days to address bronchospasm which is impairing his airway clearance.    Advised to use oxygen whenever ambulating/exerting himself. Requesting sleep study report from [facility].    Patient spontaneously expectorated sputum that was sent for bacterial and AFB cultures. These results will help to guide antibiotic therapy in the future during exacerbations.  If he has future exacerbations, sputum should be sent for bacterial and AFB cultures. The bacterial culture should be processed like a cystic fibrosis sample given the overlapping pathogens. There are standing orders for these cultures in our system.    Ordered CBC-D and IgE to see if higher now given report of chest tightness and wheezing.    Referral sent to [facility] for evaluation.  Awaiting their review of his case.  I will reach out to Dr. Acquanetta Belling after the Thanksgiving holiday to follow up on the referral.  Jordan [name] is continuing his pulmonary rehab at [facility].    He received his influenza vaccine and COVID-19 booster prior to today's visit.    Jordan [name] will return to clinic in 3 months for follow-up with pre-bronchodilator spirometry and sputum cultures.  He will call or send me a message via MyChart if questions or concerns arise before this visit.     The above plan was discussed with the patient and he is in agreement.  I personally spent 45 minutes face-to-face and non-face-to-face in the care of this patient, which includes all pre, intra, and post visit time on the date of service.     Subjective:      HPI: Jordan [name] is a 64 y.o. male who is seen for follow up of bronchiectasis with PsA colonization and prior history of lung resection.    09/27/18:  I last saw him on 05/17/18. He reached out at the end of May reporting symptoms c/w an exacerbation. I treated him with a two week course of Ciprofloxacin, which seemed to help.  Reports that he feels winded too much - if walks up incline or up a couple hundred feet. This has worsened over the past year. Has to stop and let lungs catch up.  Of note, he has been off testosterone supplementation for the past year after he was diagnosed with prostate cancer.    He received the [facility], which he is using with nebulized HTS 7% and Pulmozyme. After using the Vest for 6-8 weeks, he noticed improvement with more phlegm mobilization. Sometimes has to pause it to cough to get stuff up.  Plugging feeling happening a lot more, especially with the vest.  Most of the time, sputum is thinned out with Pulmozyme but still gets those plugs.    Had to stop eating ice cream or drinking milkshake - will cough will eating and that makes him vomit on occasion. Past few weeks, when eating will cough, no matter what he eats.  Happens almost once a day. Doesn't feel like things get stuck typically.      02/07/19:  Following his last visit, Jordan [name] reached out regarding portable oxygen.  We had considered the life 2000 but he does not qualify because he does not use supplemental oxygen.  He performed the 6-minute walk test prior to today's visit but did not desaturate.  He wonders if this is because the test was done in the morning and he doesn't have breathing issues in the morning.  Typically, breathing issues occur in afternoon.  During these episodes, he will have to use his inhaler and feels like he is suffocating.  He describes it as an uncomfortable feeling that limits his ability to do things with family.  If he is having trouble catching breath, oximetry is 92-93%. Has seen it in the 80s if comes in from outside and is short of breath.    For airway clearance, he is nebulizing 10% hypertonic saline and using his percussive vest for 20 minute sessions in AM and PM. Has to pause it to cough and get stuff up.  Coughing up more stuff since last visit and is waking up 1-2 times a night with cough.having coughing fits between clearance session.  What he expectorates is less of a mucous plug and mostly stringy sputum that is yellowish to light brown in color.  No hemoptysis.  On Saturday, he had a coughing fit that nearly triggered posttussive emesis.  He only expectorated about 3-4 globs of phlegm.  Nebulizing Pulmozyme daily.  Alternates [name] and colistin every other month.  On [name] this month and will start Colistin on Dec 1.    06/06/19:  Coughing more and more sputum.  Waking up 1-2 times per night coughing. Can cough once or just keep coughing.  Sputum is yellow in color without evidence of blood.  He is not expectorating green sputum currently. On colistin this month; alternates with [name]. Went through January off all  inhaled antibiotics.  Oxygen levels in the low 90s.  Uses rescue inhaler if goes up stairs.  Regularly performing airway clearance with nebulized 10% hypertonic saline twice a day, percussive vest twice a day, and Pulmozyme daily. He last received IV antibiotics in January 2019 but has been treated with oral antibiotics on 04/19/18 and 08/17/18, 03/10/19.      11/10/19:  1-2 times per week can get sharp right sided chest pain - even with nipple line. Can happen with clearance or activity or rest. Never during sleep. Following his hospitalization, he first experienced it in June. Lasts only about 10 seconds. Not reproducible. Limited activity due to dyspnea. Regularly performing airway clearance with nebulized 10% hypertonic saline twice a day, percussive vest twice a day, and Pulmozyme daily. Remains on chronic azithromycin therapy.    02/13/20:  Showed up to [facility] with oxygen saturations of 87-88% after walking from parking lot (<100 yd) and up incline without supplemental oxygen.  Using 2L continuous flow with exertion and sleep.  Had sleep study done at [facility] on RA and was told that he should be fine with just the supplemental oxygen. I do not have this report. Using oxygen when goes to football game; needs long period to recover from activity.  Going up 20 foot incline with oxygen, will have to stop and catch breath.     Wheezing more lately and endorses chest tightness. Has need to use albuterol as rescue 3-4 times per day.  Used albuterol in the car on the way here. Typically would be able to cough up large amount of mucus after PFTs but only coughed up small amount. Feels like mucus is stuck in lungs despite twice daily airway clearance with 10% HTS and Vest. Also using Pulmozyme once a day and Symbicort twice a day after clearance.  Using colistin this month. He ordered the [name] nebs to use next month even though insurance won't cover it. Feels like inhaled antibiotics definitely help.    Past Medical History:   Diagnosis Date   ??? Abscess of lung (CMS-HCC) 11/06/2010    CT Chest 10/22/10 12/05/2010 right thoracotomy resection of right middle lobe and resection of right lower lobe abscess    ??? Arthritis     back   ??? Biceps tendon tear 2013    left side   ??? Bronchiectasis (CMS-HCC)     chronic psuedomonas infection   ??? Degenerative joint disease of left acromioclavicular joint    ??? GERD (gastroesophageal reflux disease)    ??? Hypertension    ??? Obstructive sleep apnea on CPAP 03/14/13    AHI 33.5, on BiPAP 12/8 based on sleep study 09/2013   ??? Pneumonia 2012    Lung abcess    ??? PONV (postoperative nausea and vomiting)    ??? Prostate cancer (CMS-HCC) 09/09/2017   ??? Rotator cuff injury left   ??? Vertigo        Past Surgical History:   Procedure Laterality Date   ??? APPENDECTOMY  1969   ??? BACK SURGERY     ??? BRONCHOSCOPY  10/19/2010    [facility]   ??? CARPAL TUNNEL RELEASE  1999   ??? LUNG REMOVAL, PARTIAL Right 10/2010    RML and RLL partially resected   ??? MENISCECTOMY  2011   ??? NECK SURGERY  1993, 1999, 2002   ??? PR GERD TST W/ MUCOS IMPEDE ELECTROD,>1HR N/A 07/06/2012    Procedure: ESOPHAGEAL FUNCTION TEST, GASTROESOPHAGEAL REFLUX TEST W/  NASAL CATHETER INTRALUMINAL IMPEDANCE ELECTRODE(S) PLACEMENT, RECORDING, ANALYSIS AND INTERPRETATION; PROLONGED;  Surgeon: None None;  [facility]: [facility];  Service: Gastroenterology   ??? PR OPEN TREAT RIB FRACTURE W/INT FIXATION, UNILATERAL, 1-2 RIBS Right 03/19/2015    Procedure: OPEN TREATMENT OF RIB FRACTURE REQUIRING INTERNAL FIXATION, UNILATERAL; 1-2 RIBS;  Surgeon: Evert Kohl, MD;  Location: MAIN OR [facility];  Service: Cardiothoracic   ??? PR THORACOTOMY W/THERAP WEDGE RESEXN ADDL IPSILATRL Right 08/07/2013    Procedure: THORACOTOMY; WITH THERAPEUTIC LOBECTOMY OF RIGHT MIDDLE AND RIGHT LOWER LOBE RESECTION , EACH ADDITIONAL RESECTION, IPSILATERAL;  Surgeon: Alvester Chou, MD;  Location: MAIN OR [facility];  Service: Cardiothoracic   ??? TONSILLECTOMY  1965       Family History   Problem Relation Age of Onset   ??? Alzheimer's disease Mother    ??? Stroke Mother    ??? Bronchiectasis  Brother         presumed   ??? Bronchiectasis  Brother         presumed   ??? Liver disease Father    ??? Diabetes Father    ??? Heart failure Father    ??? Bronchiectasis  Daughter    ??? Asthma Son         childhood asthma   ??? Clotting disorder Neg Hx    ??? Anesthesia problems Neg Hx        Social History     Tobacco Use   ??? Smoking status: Never Smoker   ??? Smokeless tobacco: Never Used   Substance Use Topics   ??? Alcohol use: No     Alcohol/week: 0.0 standard drinks   ??? Drug use: No       Allergies  Reviewed on 02/13/2020      Reactions Comment    Gentamicin Other (See Comments) BALANCE ISSUES    Tobramycin Other (See Comments) ototoxicity    Morphine Nausea And Vomiting         Current Outpatient Medications   Medication Sig Dispense Refill   ??? albuterol 2.5 mg /3 mL (0.083 %) nebulizer solution Inhale 3 mL (2.5 mg total) by nebulization Two (2) times a day. 540 mL 3   ??? albuterol HFA 90 mcg/actuation inhaler Inhale 2 puffs Four (4) times a day. 25.5 g 3   ??? azithromycin (ZITHROMAX) 250 MG tablet Take 1 tablet (250 mg total) by mouth daily. 90 tablet 3   ??? colistimethate (COLYMYCIN) 150 mg injection Inject 2mL sterile water for injection to mix colistin vial, then draw up 2mL (150mg ) and inhale 2 times a day,28 days on and 28 days off. 60 each 5   ??? dornase alfa (PULMOZYME) 1 mg/mL nebulizer solution Inhale 2.5 mg daily. Use at least 30-60 minutes before airway clearance, or after airway clearance 225 mL 3   ??? HYDROcodone-acetaminophen (NORCO) 7.5-325 mg per tablet Take 1 tablet by mouth three (3) times a day (at 6am, noon and 6pm).     ??? montelukast (SINGULAIR) 10 mg tablet Take 1 tablet (10 mg total) by mouth nightly. 90 tablet 3   ??? nebulizers (LC PLUS) Misc use with Colistin 1 each 5   ??? omeprazole (PRILOSEC) 20 MG capsule Take 20 mg by mouth Two (2) times a day.      ??? sodium chloride 10 % Nebu Inhale 5 mL by nebulization Two (2) times a day. 900 mL 3   ??? SYMBICORT 160-4.5 mcg/actuation inhaler INHALE 2 PUFFS INTO THE LUNGS 2 TIMES A  DAY. 30.6 g 1   ??? tobramycin, PF, ([name]) 300 mg/5 mL nebulizer solution Inhale 5 mL (300 mg total) by nebulization every twelve (12) hours. 28 days on and 28 days off. 280 mL 5   ??? alcohol swabs (ALCOHOL PREP PADS) PadM Use as directed with inhaled antibiotics 100 each 5   ??? ascorbic acid, vitamin C, (VITAMIN C) 1000 MG tablet Take 1,000 mg by mouth daily.     ??? empty container Misc USE AS DIRECTED 1 each 2   ??? ferrous sulfate (IRON, FERROUS SULFATE,) 325 (65 FE) MG tablet Take 325 mg by mouth Two (2) times a day. BID     ??? HYDROcodone-homatropine (HYCODAN) 5-1.5 mg/5 mL syrup Take 5 mL by mouth every four (4) hours as needed for cough. 120 mL 0   ??? multivitamin-minerals-lutein Tab Take 1 tablet by mouth daily.      ??? nebulizers (LC PLUS) Misc Use as directed with hypertonic saline 10% 3 each 3   ??? sterile water, PF, Soln Use 2mL to mix Colistin,then add additional 2mL to neb cup with 2mL of mixed colistin.Inhale twice daily 28 days on and 28 days off. 600 mL 5   ??? syringe with needle (BD LUER-LOK SYRINGE) 3 mL 20 gauge x 1 1/2 Syrg For use with inhaled antibiotic (Colistin) 60 each 30   ??? syringe with needle (BD LUER-LOK SYRINGE) 3 mL 20 gauge x 1 Syrg For use with inhaled antibiotic (Colistin) 60 each 5     No current facility-administered medications for this visit.     Physical Exam:   BP 126/76 (BP Site: L Arm, BP Position: Sitting, BP Cuff Size: Medium)  - Pulse 79  - Temp 36.3 ??C (97.3 ??F) (Temporal)  - Ht 174 cm (5' 8.5)  - Wt 82.6 kg (182 lb)  - SpO2 91%  - BMI 27.27 kg/m??   Well appearing white male in no acute distress, in good spirits. Easy work of breathing without accessory muscle use. Speaking in full sentences.  Crackles appreciated over left lower lobe. Expiratory wheezing over bilateral mid and upper lung fields. Decreased breath sounds over right base, consistent with prior resection.  Regular rate and rhythm with normal S1 and S2.  No murmurs, rubs, or gallops.  Clubbing unchanged from prior.  No lower extremity edema.    Diagnostic Review:   The following data were reviewed during this visit with key findings summarized below:  Pulmonary Function Testing:      Spirometry obtained today consistent with severe airway obstruction and severe restriction.     FVC (% predicted) FEV1 (% predicted) FEV1/FVC   11/10/2013 3.19 L (68%) 2.16 L (61%)  68%   02/23/2014 3.50 L (76%)  2.23 L (64%)  64%   07/12/2014  2.94 L (66%)  2.02 L (58%)  69%   01/15/2015  2.82 L (62%)  1.79 L (52%)  63%   02/08/2015  3.04 L (66%)  2.06 L (59%)  68%   04/02/2015  2.86 L (63%)  1.83 L (53%)  64%   12/10/2015  2.77 L (62%)  1.62 L (48%)  58%   01/27/2016  2.42 L (54%)  1.55 L (46%)  64%   03/24/2016  3.36 L (75%)  1.99 L (59%)  59%   07/21/2016  2.56 L (57%)  1.54 L (46%)  60%   12/29/2016  2.80 L (63%)  1.57 L (47%)  56%   04/06/2017  2.61 L (59%)  1.46 L (  44%)  56%   07/06/2017  2.56 L (58%)  1.49 L (45%)  58%   10/12/2017  2.63 L (59%)  1.47 L (44%)  56%   01/18/2018  2.68 L (63%)  1.36 L (42%)  51%   05/17/2018 2.49 L (57%)  1.25 L (38%) 50%   09/26/2018 2.43 L (57%)  1.26 L (39%)  52%   01/26/2019 2.16 L (51%)  1.16 L (36%)  54%   06/06/2019 2.47 L (59%) 1.11 L (34%) 45%   11/10/19 2.14 L (50%) 1.20 L (37%) 56%   02/13/20 2.01 L (48%) 1.08 L (34%) 54%     6-minute Walk (12/06/19): Heart Rate: Resting Heart Rate was 71 bpm. Maximum Heart Rate achieved 97 bpm. Patient did reach target heart rate (85-132 bpm).  Heart rate returned to 77 bpm after 2 minutes of recovery.  Oxygen Saturation: Resting O2 Saturation was 94% on RA. O2 saturation nadir was 87% at minute 2. O2 saturation improved to 96% with 2L via [place] at minute 3 and maintained saturations above 88% for remainder of walk. O2 saturation returned to 96% on room air after 2 minutes of recovery.  Distance: Total 6 minute walk distance was 320 m. Predicted 6 minute walk distance 548.9 m (LLN of 395.8 m). Percent of predicted 6 minute walk distance of 58%. Testing included one 5-minute pause. Distance obtained after placed on 2L and walked for an additional 6 minutes.  BORG: Resting BORG score was 1. Maximum BORG score was 5 at minute 2. Concluding BORG score was 0 after 2 minutes of recovery. Impression: Significant exercise induced oxygen desaturation (below 88%). Exertional hypoxemia improved with 2L supplemental oxygen. Walk distance is less than expected suggesting a cardio-respiratory defect -- clinical correlation suggested.  Recommendation: Supplemental oxygen required with exercise at 2L [place]. Consider further evaluation with spirometry, lung volumes, respiratory muscle flow/force measurements, cardiopulmonary stress testing, and/or cardio/vascular studies.    6-Minute Walk (02/15/19): Resting Heart Rate was 91. Maximum Heart Rate achieved 119. Patient did reach target heart rate (86-133). Resting O2 Saturation was 98% on RA. Resting BORG score was 1. O2 saturation nadir was 91% at minute 6. Total 6 minute walk distance was 381 meters. Predicted 6 minute walk distance of 558 m (LLN of 461m). Percent of predicted 6 minute walk distance of 68%. Heart rate returned to 96 bpm after 2 minutes of recovery. O2 saturation returned to 96% on RA after 2 minutes of recovery. Maximum BORG score was 7 at minute 6. Concluding BORG score was 2 after 2 minutes of recovery. No evidence of resting or exercise induced hypoxemia. No supplemental oxygen is required at this time.    6-Minute Walk (01/26/19): Resting Heart Rate was 86. Maximum Heart Rate achieved 97. Patient did reach target heart rate (86-133). Resting O2 Saturation was 99% on RA. Resting BORG score was 1. O2 saturation nadir was 95% at minutes 3 and 6. Total 6 minute walk distance was 350 meters. Predicted 6 minute walk distance of 558 m (LLN of 445m). Percent of predicted 6 minute walk distance of 63%. Heart rate returned to 86 bpm after 2 minutes of recovery. O2 saturation returned to 96% on RA after 2 /minutes of recovery. Maximum BORG score was 6 at minutes 5 and 6. Concluding BORG score was 1 after 2 minutes of recovery. No evidence of resting or exercise induced hypoxemia. No supplemental oxygen is required at this time.    Cultures:  ?? Source Bacterial Culture AFB Smear  AFB Culture   04/02/15 Sputum 2+ OPF; 1+ Probably mPsA Negative Negative   07/02/15 Sputum 4+ OPF; 3+ mPsA; 3+ sPsA; 1+ Steno Negative Negative   01/23/16 Sputum 2+ OPF; 1+ mPsA; 1+ sPsA - -   03/24/16 Sputum 3+ OPF; 3+ mPsA; 3+ sPsA Negative Overgrown   07/21/16 Sputum 4+ OPF; 4+ mPsA; 2+ sPsA Negative Negative   12/29/16 Sputum 4+ OPF; 3+ mPsA; 3+ sPsA Negative [place]in[place] sp   04/06/17 Sputum 3+ OPF; 4+ mPsA Negative Negative   07/06/17 Sputum 4+ OPF; 3+ mPsA Negative Negative   10/12/17 Sputum 4+ OPF; 3+ mPsA Negative Negative   01/18/18 Sputum OPF Negative Negative   05/17/18 Sputum 4+ OPF; 3+ mPsA - -   09/26/18 Sputum 4+ OPF; 3+ mPsA Negative Negative   01/26/19 Sputum 4+ OPF; 2+ mPsA Negative Negative   02/15/19 Sputum 3+ OPF; 3+ mPsA Negative Negative   06/06/19 Sputum 3+ OPF; 3+ sPsA; 3+ mPsA Negative Negative   11/10/19 Sputum 2+ OPF; 3+ mPsA Negative Negative   ??    Bronchiectasis Evaluation   IgG with subclasses:   Lab Results   Component Value Date/Time    IGGT 996 04/28/2012 06:21 AM    IGG1 402 04/28/2012 06:21 AM    IGG2 399 04/28/2012 06:21 AM    IGG3 148.0 (H) 04/28/2012 06:21 AM    IGG4 47.7 04/28/2012 06:21 AM     IgA (09/21/11): 283   IgM (09/21/11): 52   IgE:   Lab Results   Component Value Date/Time    IGE 10.5 01/22/2016 09:53 PM     Specific Titers: No results found for: TETANUSAB, TETGV, DIPHTERIAAB, DIPGV, S. pneumonia titers  HIV: No results found for: HIV  Autoimmune: No results found for: ANA, ANATITER1, PAT1, ANATITER2, PAT2, DSDNAAB, ENAS, RF, CCPAB, CCPIGG  ANCA: No results found for: ANCA, IFA, PR3ELISA, PR3QT, MPO, MPOQT  CBC-D:   Lab Results   Component Value Date/Time    WBC 5.4 04/19/2017 05:45 AM    WBC 7.9 08/13/2013 03:57 AM    HGB 14.1 04/19/2017 05:45 AM    HGB 14.8 03/24/2015 05:31 PM    HCT 43.9 04/19/2017 05:45 AM    HCT 30.8 (L) 08/13/2013 03:57 AM    PLT 140 (L) 04/19/2017 05:45 AM    PLT 292 08/13/2013 03:57 AM    NEUTROABS 3.2 04/19/2017 05:45 AM    NEUTROABS 4.9 06/21/2013 09:31 PM    LYMPHSABS 1.4 (L) 04/19/2017 05:45 AM    LYMPHSABS 1.9 06/21/2013 09:31 PM    EOSABS 0.2 04/19/2017 05:45 AM    EOSABS 0.2 06/21/2013 09:31 PM    BASOSABS 0.0 04/19/2017 05:45 AM    BASOSABS 0.0 06/21/2013 09:31 PM     Alpha-1 level: No results found for: A1TRYP  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    05/23/18:               Imaging  Chest CT (11/10/19): Images personally reviewed. Stable sequelae of right middle lobectomy and right lower lobe wedge resection. Stable consolidation in the medial posterior right upper lobe and right lung apex, bilateral diffuse extensive bronchiectasis and bronchial wall thickening  and unchanged sequelae of chronic endobronchial infection.    Chest CT (02/15/19): Images personally reviewed. Stable sequelae of right middle lobectomy and right lower lobe wedge resection.  Unchanged diffuse extensive bronchiectasis and bronchial wall thickening bilaterally.  Diffusely scattered tree-in-bud nodules in bilateral lung parenchyma with interval increase in tree-in-bud nodules in left lung; the constellation of these findings are consistent with sequelae of worsening chronic endobronchial infection.    Chest CT (03/31/16): Images personally reviewed. Status post right middle and right lower lobectomies. Tiny loculated basilar right hydropneumothorax, probably chronic. Moderate cylindrical and varicoid bronchiectasis throughout both lungs, with associated scattered mucoid impaction, tree-in-bud opacities and scattered peribronchovascular and subpleural consolidation in the right upper lobe. 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 Two-vessel coronary atherosclerosis. Nonobstructing right nephrolithiasis.    Modified Barium Swallow (01/17/19): Shallow laryngeal penetration observed with thin liquids and puree.  No aspiration identified with any of the tested consistencies.    Immunization History   Administered Date(s) Administered   ??? COVID-19 VACC,MRNA,(PFIZER)(PF)(IM) 04/20/2019, 05/18/2019, 12/01/2019   ??? INFLUENZA TIV (TRI) 24MO+ W/ PRESERV (IM) 11/13/2010, 01/07/2012, 12/09/2017, 11/30/2019   ??? INFLUENZA TIV (TRI) PF (IM) 02/20/2012, 12/09/2012   ??? Influenza Vaccine Quad (IIV4 PF) 50mo+ injectable 01/31/2016, 12/09/2017   ??? Influenza Virus Vaccine, unspecified formulation 12/25/2014, 12/01/2016   ??? Influenza Whole 12/21/2008   ??? PNEUMOCOCCAL POLYSACCHARIDE 23 10/22/2010, 02/07/2016, 10/07/2019   ??? Pneumococcal Conjugate 13-Valent 04/28/2013   ??? TdaP 03/22/2012     Portions of this record have been created using [employer]. Dictation errors have been sought, but may not have been identified and corrected.    Jordan Gilmore, MD  02/13/20  9:11 AM

## 2020-02-13 ENCOUNTER — Other Ambulatory Visit: Payer: Self-pay

## 2020-02-13 ENCOUNTER — Encounter (HOSPITAL_COMMUNITY): Payer: PPO

## 2020-02-13 ENCOUNTER — Encounter (HOSPITAL_COMMUNITY)
Admission: RE | Admit: 2020-02-13 | Discharge: 2020-02-13 | Disposition: A | Discharge status: Home / Self Care | Payer: PPO | Source: Ambulatory Visit | Attending: Cardiology | Admitting: Cardiology

## 2020-02-13 ENCOUNTER — Encounter: Admit: 2020-02-13 | Discharge: 2020-02-14 | Payer: MEDICARE | Attending: Internal Medicine | Primary: Internal Medicine

## 2020-02-13 ENCOUNTER — Encounter: Admit: 2020-02-13 | Discharge: 2020-02-14 | Payer: MEDICARE

## 2020-02-13 DIAGNOSIS — A498 Other bacterial infections of unspecified site: Secondary | ICD-10-CM | POA: Diagnosis not present

## 2020-02-13 DIAGNOSIS — J479 Bronchiectasis, uncomplicated: Secondary | ICD-10-CM | POA: Diagnosis not present

## 2020-02-13 DIAGNOSIS — J9611 Chronic respiratory failure with hypoxia: Secondary | ICD-10-CM | POA: Diagnosis not present

## 2020-02-13 LAB — CBC W/ AUTO DIFF
BASOPHILS ABSOLUTE COUNT: 0 10*9/L (ref 0.0–0.1)
BASOPHILS RELATIVE PERCENT: 0.6 %
EOSINOPHILS ABSOLUTE COUNT: 0.2 10*9/L (ref 0.0–0.7)
EOSINOPHILS RELATIVE PERCENT: 3.5 %
HEMATOCRIT: 43 % (ref 38.0–50.0)
HEMOGLOBIN: 14.4 g/dL (ref 13.5–17.5)
LYMPHOCYTES ABSOLUTE COUNT: 1.4 10*9/L (ref 0.7–4.0)
LYMPHOCYTES RELATIVE PERCENT: 23.7 %
MEAN CORPUSCULAR HEMOGLOBIN CONC: 33.6 g/dL (ref 30.0–36.0)
MEAN CORPUSCULAR HEMOGLOBIN: 31.3 pg (ref 26.0–34.0)
MEAN CORPUSCULAR VOLUME: 93.1 fL (ref 81.0–95.0)
MEAN PLATELET VOLUME: 8.4 fL (ref 7.0–10.0)
MONOCYTES ABSOLUTE COUNT: 0.6 10*9/L (ref 0.1–1.0)
MONOCYTES RELATIVE PERCENT: 9.7 %
NEUTROPHILS ABSOLUTE COUNT: 3.7 10*9/L (ref 1.7–7.7)
NEUTROPHILS RELATIVE PERCENT: 62.5 %
PLATELET COUNT: 167 10*9/L (ref 150–450)
RED BLOOD CELL COUNT: 4.61 10*12/L (ref 4.32–5.72)
RED CELL DISTRIBUTION WIDTH: 12 % (ref 12.0–15.0)
WBC ADJUSTED: 6 10*9/L (ref 3.5–10.5)

## 2020-02-13 MED ORDER — PREDNISONE 20 MG TABLET
ORAL_TABLET | Freq: Every day | ORAL | 0 refills | 5 days | Status: CP
Start: 2020-02-13 — End: 2020-02-18

## 2020-02-13 MED ORDER — TIOTROPIUM BROMIDE 2.5 MCG/ACTUATION MIST FOR INHALATION
Freq: Every day | RESPIRATORY_TRACT | 3 refills | 0.00000 days | Status: CP
Start: 2020-02-13 — End: 2021-02-12
  Filled 2020-02-19: qty 12, 90d supply, fill #0

## 2020-02-13 NOTE — Progress Notes (Signed)
Daily Session Note  Patient Details  Name: [name] MRN: [id number] Date of Birth: Oct 07, 1955 Referring Provider:     Pulmonary Rehab Walk Test from 01/08/2020 in [place]  Referring Provider Dr. Olena Heckle      Encounter Date: 02/13/2020  Check In:  Session Check In - 02/13/20 [place]      Check-In   Supervising physician immediately available to respond to emergencies [facility] immediately available    Physician(s) Dr. Wynelle Cleveland    Location [facility]    Staff Present Rosebud Poles, RN, BSN;Lisa Ysidro Evert, RN;Jessica Hassell Done, MS, ACSM-CEP, Exercise Physiologist    Virtual Visit No    Medication changes reported     No    Fall or balance concerns reported    No    Tobacco Cessation No Change    Warm-up and Cool-down Performed as group-led instruction    Resistance Training Performed Yes    VAD Patient? No    PAD/SET Patient? No      Pain Assessment   Currently in Pain? No/denies    Multiple Pain Sites No           Capillary Blood Glucose: No results found for this or any previous visit (from the past 24 hour(s)).    Social History   Tobacco Use  Smoking Status Never Smoker  Smokeless Tobacco Never Used    Goals Met:  Proper associated with RPD/PD & O2 Sat Exercise tolerated well Strength training completed today  Goals Unmet:  Not Applicable  Comments: Service time is from 1350 to 1500    Dr. Fransico Him is Medical Director for Cardiac Rehab at [facility].

## 2020-02-13 NOTE — Unmapped (Signed)
Sputum specimen collected for AFB and lower respiratory cultures, delivered to lab via tube station.

## 2020-02-13 NOTE — Unmapped (Signed)
Patient has received his POC and has it with him but left it in the car.  We discussed that it is important that he use it when ambulating.  He also said he is enjoying pulmonary rehab and would like to find something similar at a local gym when he finishes.  I encouraged him to ask the staff at his rehab program if there is anything like that locally.  I also encouraged him to be sure he has things at home he can do if he is unable to get out this winter.  Patient will call or My Chart with any questions or concerns.

## 2020-02-14 LAB — IGE: TOTAL IGE: 16.6 [IU]/mL

## 2020-02-14 MED ORDER — SYRINGE WITH NEEDLE 3 ML 20 GAUGE X 1 1/2"
30 refills | 0 days | Status: CP
Start: 2020-02-14 — End: ?
  Filled 2020-03-27: qty 60, 30d supply, fill #0

## 2020-02-19 MED FILL — SPIRIVA RESPIMAT 2.5 MCG/ACTUATION SOLUTION FOR INHALATION: 90 days supply | Qty: 12 | Fill #0 | Status: AC

## 2020-02-19 MED FILL — TOBRAMYCIN 300 MG/5 ML IN 0.225 % SODIUM CHLORIDE FOR NEBULIZATION: 56 days supply | Qty: 280 | Fill #0 | Status: AC

## 2020-02-19 MED FILL — LC PLUS MISC: 30 days supply | Qty: 1 | Fill #4 | Status: AC

## 2020-02-19 MED FILL — LC PLUS MISC: 30 days supply | Qty: 1 | Fill #4

## 2020-02-20 ENCOUNTER — Other Ambulatory Visit: Payer: Self-pay

## 2020-02-20 ENCOUNTER — Encounter (HOSPITAL_COMMUNITY)
Admission: RE | Admit: 2020-02-20 | Discharge: 2020-02-20 | Disposition: A | Discharge status: Home / Self Care | Payer: PPO | Source: Ambulatory Visit | Attending: Cardiology | Admitting: Cardiology

## 2020-02-20 ENCOUNTER — Encounter (HOSPITAL_COMMUNITY): Payer: PPO

## 2020-02-20 VITALS — Wt 182.5 lb

## 2020-02-20 DIAGNOSIS — J479 Bronchiectasis, uncomplicated: Secondary | ICD-10-CM | POA: Diagnosis not present

## 2020-02-20 NOTE — Progress Notes (Signed)
Daily Session Note  Patient Details  Name: [name] MRN: [id number] Date of Birth: [birth year] Referring Provider:     Pulmonary Rehab Walk Test from 01/08/2020 in [place]  Referring Provider Dr. Olena Heckle      Encounter Date: 02/20/2020  Check In:  Session Check In - 02/20/20 1428      Check-In   Supervising physician immediately available to respond to emergencies [facility] immediately available    Physician(s) Dr. Tyrell Antonio    [facility]    Staff Present Rosebud Poles, RN, BSN;Lisa Ysidro Evert, RN;Jessica Hassell Done, MS, ACSM-CEP, Exercise Physiologist    Virtual Visit No    Medication changes reported     No    Fall or balance concerns reported    No    Tobacco Cessation No Change    Warm-up and Cool-down Performed on first and last piece of equipment    Resistance Training Performed Yes    VAD Patient? No    PAD/SET Patient? No      Pain Assessment   Currently in Pain? No/denies    Multiple Pain Sites No           Capillary Blood Glucose: No results found for this or any previous visit (from the past 24 hour(s)).   Exercise Prescription Changes - 02/20/20 1500      Response to Exercise   Blood Pressure (Admit) 118/72    Blood Pressure (Exercise) 140/72    Blood Pressure (Exit) 120/60    Heart Rate (Admit) 110 bpm    Heart Rate (Exercise) 115 bpm    Heart Rate (Exit) 113 bpm    Oxygen Saturation (Admit) 95 %    Oxygen Saturation (Exercise) 94 %    Oxygen Saturation (Exit) 94 %    Rating of Perceived Exertion (Exercise) 15    Perceived Dyspnea (Exercise) 3    Duration Continue with 30 min of aerobic exercise without signs/symptoms of physical distress.    Intensity THRR unchanged      Progression   Progression Continue to progress workloads to maintain intensity without signs/symptoms of physical distress.      Resistance Training   Training Prescription Yes    Weight Orange bands    Reps  10-15    Time 10 Minutes      Oxygen   Oxygen Continuous    Liters 2      NuStep   Level 3    SPM 80    Minutes 15    METs 2.1      Arm Ergometer   Level 2.5    RPM 51    Minutes 15           Social History   Tobacco Use  Smoking Status Never Smoker  Smokeless Tobacco Never Used    Goals Met:  Proper associated with RPD/PD & O2 Sat Exercise tolerated well Strength training completed today  Goals Unmet:  Not Applicable  Comments: Service time is from 1355 to 1500.    Dr. Fransico Him is Medical Director for Cardiac Rehab at [facility].

## 2020-02-21 NOTE — Unmapped (Signed)
Phoned [facility] today at [phone] and got their voicemail.     Requested report from his sleep study and provided our fax number.     Also left my direct office line number for questions.

## 2020-02-22 ENCOUNTER — Encounter (HOSPITAL_COMMUNITY): Payer: PPO

## 2020-02-22 DIAGNOSIS — G4733 Obstructive sleep apnea (adult) (pediatric): Secondary | ICD-10-CM | POA: Diagnosis not present

## 2020-02-22 DIAGNOSIS — J479 Bronchiectasis, uncomplicated: Secondary | ICD-10-CM | POA: Diagnosis not present

## 2020-02-22 DIAGNOSIS — J471 Bronchiectasis with (acute) exacerbation: Secondary | ICD-10-CM | POA: Diagnosis not present

## 2020-02-23 DIAGNOSIS — A498 Other bacterial infections of unspecified site: Principal | ICD-10-CM

## 2020-02-23 DIAGNOSIS — G4733 Obstructive sleep apnea (adult) (pediatric): Principal | ICD-10-CM

## 2020-02-23 DIAGNOSIS — J9611 Chronic respiratory failure with hypoxia: Principal | ICD-10-CM

## 2020-02-23 DIAGNOSIS — J479 Bronchiectasis, uncomplicated: Principal | ICD-10-CM

## 2020-02-27 ENCOUNTER — Other Ambulatory Visit: Payer: Self-pay

## 2020-02-27 ENCOUNTER — Encounter (HOSPITAL_COMMUNITY)
Admission: RE | Admit: 2020-02-27 | Discharge: 2020-02-27 | Disposition: A | Discharge status: Home / Self Care | Payer: PPO | Source: Ambulatory Visit | Attending: Cardiology | Admitting: Cardiology

## 2020-02-27 ENCOUNTER — Encounter (HOSPITAL_COMMUNITY): Payer: PPO

## 2020-02-27 DIAGNOSIS — J479 Bronchiectasis, uncomplicated: Secondary | ICD-10-CM | POA: Diagnosis not present

## 2020-02-27 NOTE — Progress Notes (Signed)
Daily Session Note  Patient Details  Name: [name] MRN: [id number] Date of Birth: Apr 28, 1955 Referring Provider:     Pulmonary Rehab Walk Test from 01/08/2020 in [place]  Referring Provider Dr. Olena Heckle      Encounter Date: 02/27/2020  Check In:  Session Check In - 02/27/20 1452      Check-In   Supervising physician immediately available to respond to emergencies [facility] immediately available    Physician(s) Dr. Cyndia Skeeters    [facility]    Staff Present Rosebud Poles, RN, Roque Cash, RN    Virtual Visit No    Medication changes reported     No    Fall or balance concerns reported    No    Tobacco Cessation No Change    Warm-up and Cool-down Performed on first and last piece of equipment    Resistance Training Performed Yes    VAD Patient? No    PAD/SET Patient? No      Pain Assessment   Currently in Pain? No/denies    Multiple Pain Sites No           Capillary Blood Glucose: No results found for this or any previous visit (from the past 24 hour(s)).    Social History   Tobacco Use  Smoking Status Never Smoker  Smokeless Tobacco Never Used    Goals Met:  Proper associated with RPD/PD & O2 Sat Exercise tolerated well Strength training completed today  Goals Unmet:  Not Applicable  Comments: Service time is from 1400 to 1500    Dr. Fransico Him is Medical Director for Cardiac Rehab at [facility].

## 2020-02-28 NOTE — Unmapped (Signed)
Addended by: Viona Gilmore on: 02/28/2020 09:44 AM     Modules accepted: Orders

## 2020-02-28 NOTE — Unmapped (Signed)
Addended by: Viona Gilmore on: 02/28/2020 11:24 AM     Modules accepted: Orders

## 2020-02-29 ENCOUNTER — Encounter (HOSPITAL_COMMUNITY): Payer: PPO

## 2020-02-29 ENCOUNTER — Encounter (HOSPITAL_COMMUNITY)
Admission: RE | Admit: 2020-02-29 | Discharge: 2020-02-29 | Disposition: A | Discharge status: Home / Self Care | Payer: PPO | Source: Ambulatory Visit | Attending: Cardiology | Admitting: Cardiology

## 2020-02-29 ENCOUNTER — Other Ambulatory Visit: Payer: Self-pay

## 2020-02-29 VITALS — Wt 182.8 lb

## 2020-02-29 DIAGNOSIS — J479 Bronchiectasis, uncomplicated: Secondary | ICD-10-CM

## 2020-02-29 DIAGNOSIS — Z01818 Encounter for other preprocedural examination: Principal | ICD-10-CM

## 2020-02-29 NOTE — Progress Notes (Signed)
Daily Session Note  Patient Details  Name: [name] MRN: [id number] Date of Birth: December 04, 1955 Referring Provider:   April Manson Pulmonary Rehab Walk Test from 01/08/2020 in [place]  Referring Provider Dr. Olena Heckle      Encounter Date: 02/29/2020  Check In:  Session Check In - 02/29/20 1446      Check-In   Supervising physician immediately available to respond to emergencies [facility] immediately available    Physician(s) Dr. Cyndia Skeeters    [facility]    Staff Present Rosebud Poles, RN, BSN;Shermar Friedland Ysidro Evert, RN;Jessica Hassell Done, MS, ACSM-CEP, Exercise Physiologist    Virtual Visit No    Medication changes reported     No    Fall or balance concerns reported    No    Tobacco Cessation No Change    Warm-up and Cool-down Performed on first and last piece of equipment    Resistance Training Performed Yes    VAD Patient? No    PAD/SET Patient? No      Pain Assessment   Currently in Pain? No/denies    Multiple Pain Sites No           Capillary Blood Glucose: No results found for this or any previous visit (from the past 24 hour(s)).    Social History   Tobacco Use  Smoking Status Never Smoker  Smokeless Tobacco Never Used    Goals Met:  Exercise tolerated well No report of cardiac concerns or symptoms Strength training completed today  Goals Unmet:  Not Applicable  Comments: Service time is from 1345 to 1510    Dr. Fransico Him is Medical Director for Cardiac Rehab at [facility].

## 2020-02-29 NOTE — Unmapped (Signed)
TFC called patient to verify insurance and make first contact, no answer, left VM

## 2020-02-29 NOTE — Unmapped (Signed)
Per Nicholaus Bloom    New Referral: Orion Modest, 64y/o male with bronchiectasis  Referral From: Rolm Baptise  MRN: [id number]  DOB 16-Oct-1955    - FEV1 1.08 (33.7%), FVC 2.01 (48%)  - New nocturnal O2 requirement and with activity  - Chronic PsA  - GERD, OSA  - Prostate CA 08/2017, no evidence of disease following diagnosis and surgery in 2019  - Robotic prostatectomy with node dissection 11/2017  - Underwent R thoracotomy resection RML, RLL with Dr. Oneida Alar 10/2010  - No smoking, drugs, alcohol  - BMI 27.27  - 363m  - Not in network with [facility]

## 2020-03-01 NOTE — Unmapped (Signed)
TFC spoke with patient and patient is switching insurance Jan 2022. TFC informed patient she will defer referral until then and call back to get new insurance information. Patient switching to a [facility] so there is a chance patient will need to go to [facility] but will call to open case in Jan and let [employer] deny the case.    Patient not financially cleared at this time

## 2020-03-01 NOTE — Unmapped (Signed)
TFC called patient to verify insurance and make first contact, no answer, left VM

## 2020-03-02 DIAGNOSIS — G4733 Obstructive sleep apnea (adult) (pediatric): Secondary | ICD-10-CM | POA: Diagnosis not present

## 2020-03-02 DIAGNOSIS — J479 Bronchiectasis, uncomplicated: Secondary | ICD-10-CM | POA: Diagnosis not present

## 2020-03-02 DIAGNOSIS — J471 Bronchiectasis with (acute) exacerbation: Secondary | ICD-10-CM | POA: Diagnosis not present

## 2020-03-04 DIAGNOSIS — L812 Freckles: Secondary | ICD-10-CM | POA: Diagnosis not present

## 2020-03-04 DIAGNOSIS — L821 Other seborrheic keratosis: Secondary | ICD-10-CM | POA: Diagnosis not present

## 2020-03-04 DIAGNOSIS — D1801 Hemangioma of skin and subcutaneous tissue: Secondary | ICD-10-CM | POA: Diagnosis not present

## 2020-03-04 DIAGNOSIS — D225 Melanocytic nevi of trunk: Secondary | ICD-10-CM | POA: Diagnosis not present

## 2020-03-04 DIAGNOSIS — L718 Other rosacea: Secondary | ICD-10-CM | POA: Diagnosis not present

## 2020-03-04 DIAGNOSIS — Z85828 Personal history of other malignant neoplasm of skin: Secondary | ICD-10-CM | POA: Diagnosis not present

## 2020-03-04 DIAGNOSIS — L570 Actinic keratosis: Secondary | ICD-10-CM | POA: Diagnosis not present

## 2020-03-05 ENCOUNTER — Encounter (HOSPITAL_COMMUNITY)
Admission: RE | Admit: 2020-03-05 | Discharge: 2020-03-05 | Disposition: A | Discharge status: Home / Self Care | Payer: PPO | Source: Ambulatory Visit | Attending: Cardiology | Admitting: Cardiology

## 2020-03-05 ENCOUNTER — Other Ambulatory Visit: Payer: Self-pay

## 2020-03-05 ENCOUNTER — Encounter (HOSPITAL_COMMUNITY): Payer: PPO

## 2020-03-05 VITALS — Wt 182.1 lb

## 2020-03-05 DIAGNOSIS — J479 Bronchiectasis, uncomplicated: Secondary | ICD-10-CM | POA: Diagnosis not present

## 2020-03-05 DIAGNOSIS — Z01818 Encounter for other preprocedural examination: Principal | ICD-10-CM

## 2020-03-05 NOTE — Progress Notes (Signed)
Pulmonary Individual Treatment Plan  Patient Details  Name: [name] MRN: [id number] Date of Birth: [birth year] Referring Provider:   April Manson Pulmonary Rehab Walk Test from 01/08/2020 in [place]  Referring Provider Dr. Olena Heckle      Initial Encounter Date:  Flowsheet Row Pulmonary Rehab Walk Test from 01/08/2020 in [place]  Date 01/08/20      Visit Diagnosis: Bronchiectasis without acute exacerbation ([place])  Patient's Home Medications on Admission:   Current Outpatient Medications:  .  acidophilus (RISAQUAD) CAPS capsule, Take 2 capsules by mouth daily., Disp: 60 capsule, Rfl: 0 .  albuterol (PROVENTIL HFA;VENTOLIN HFA) 108 (90 Base) MCG/ACT inhaler, Inhale 2 puffs into the lungs every 4 (four) hours as needed for wheezing or shortness of breath. , Disp: , Rfl:  .  albuterol (PROVENTIL) (2.5 MG/3ML) 0.083% nebulizer solution, Inhale 2.5 mg into the lungs 4 (four) times daily. , Disp: , Rfl:  .  ascorbic acid (VITAMIN C) 250 MG CHEW, Chew 1,000 mg by mouth daily. , Disp: , Rfl:  .  azithromycin (ZITHROMAX) 250 MG tablet, Take 1 tablet (250 mg total) by mouth daily., Disp: , Rfl: 0 .  budesonide-formoterol (SYMBICORT) 160-4.5 MCG/ACT inhaler, Inhale 2 puffs into the lungs 2 (two) times daily., Disp: , Rfl:  .  Cholecalciferol 25 MCG (1000 UT) tablet, Take 1,000 Units by mouth daily., Disp: , Rfl:  .  Colistimethate Sodium POWD, Take 2 mLs by nebulization 2 (two) times daily. Use only every other month. 150mg  injection., Disp: , Rfl:  .  dornase alpha (PULMOZYME) 1 MG/ML nebulizer solution, Take 2.5 mg by nebulization daily. Use at least 30-60 minutes before airway clearance or after airway clearance, Disp: , Rfl:  .  doxylamine, Sleep, (UNISOM) 25 MG tablet, Take 25 mg by mouth at bedtime., Disp: , Rfl:  .  ferrous sulfate 325 (65 FE) MG tablet, Take 325 mg by mouth 2 (two) times daily., Disp: , Rfl:  .   fluticasone (FLONASE) 50 MCG/ACT nasal spray, Place 2 sprays into both nostrils daily., Disp: , Rfl:  .  HYDROcodone-acetaminophen (NORCO) 7.5-325 MG tablet, Take 1 tablet by mouth every 6 (six) hours as needed for moderate pain. (Patient taking differently: Take 1 tablet by mouth 3 (three) times daily. ), Disp: 20 tablet, Rfl: 0 .  HYDROcodone-homatropine (HYCODAN) 5-1.5 MG/5ML syrup, Take 5 mLs by mouth every 6 (six) hours as needed for cough. (Patient taking differently: Take 5 mLs by mouth every 4 (four) hours as needed for cough. ), Disp: 240 mL, Rfl: 0 .  montelukast (SINGULAIR) 10 MG tablet, Take 10 mg by mouth at bedtime., Disp: , Rfl:  .  Multiple Vitamin (MULTIVITAMIN WITH MINERALS) TABS tablet, Take 1 tablet by mouth daily., Disp: , Rfl:  .  NON FORMULARY, Use Acapella twice daily for airway clearance. Dx. 494.0. Mucous clearing device., Disp: , Rfl:  .  omeprazole (PRILOSEC) 20 MG capsule, Take 20 mg by mouth 2 (two) times daily. , Disp: , Rfl:  .  Respiratory Therapy Supplies (FLUTTER) DEVI, Use several times daily as needed for congestion/thick mucus, Disp: , Rfl:  .  Sodium Chloride 10 % NEBU, Inhale 5 mLs into the lungs 2 (two) times daily., Disp: , Rfl:  .  tobramycin, PF, ([name]) 300 MG/5ML nebulizer solution, Take 300 mg by nebulization 2 (two) times daily. Use only every other month, Disp: , Rfl:  .  Water For Irrigation, Sterile (STERILE WATER FOR  IRRIGATION), 4 mLs daily. Mix 72ml with colistimethate powder to use for nebulization. 28 days on and 28 days off., Disp: , Rfl:   Past Medical History: Past Medical History:  Diagnosis Date  . Bronchiectasis ([place])   . Bronchiolitis   . Dizziness and giddiness 07/26/2012   none recent  . DJD (degenerative joint disease)    back and neck  . Dyspnea    with walking long distances  . Gastroesophageal reflux disease   . History of kidney stones yrs ago   passed on own  . HTN (hypertension)   . OSA (obstructive sleep apnea)    none  since 30 lb weight loss  . Pneumonia last time 2017  . Pneumonia with lung abscess ([place])   . PONV (postoperative nausea and vomiting)    likes scopolamine patch, trouble turning neck to left  . Prostate cancer ([place]) dx Aug 12, 2017  . Tear of left rotator cuff    healed on own    Tobacco Use: Social History   Tobacco Use  Smoking Status Never Smoker  Smokeless Tobacco Never Used    Labs: Recent Review Flowsheet Data    Labs for ITP Cardiac and Pulmonary Rehab Latest Ref Rng & Units 12/03/2010 12/06/2010 12/06/2010 05/23/2018   PHART 7.350 - 7.450 7.421 7.377 - -   PCO2ART 35.0 - 45.0 mmHg 35.7 47.4(H) - -   HCO3 20.0 - 28.0 mmol/L 22.8 27.9(H) - 24.2   TCO2 0 - 100 mmol/L 23.9 29 27  -   ACIDBASEDEF 0.0 - 2.0 mmol/L 1.0 - - 0.2   O2SAT % 98.0 96.0 - 77.9      Capillary Blood Glucose: No results found for: GLUCAP   Pulmonary Assessment Scores:  Pulmonary Assessment Scores    Row Name 01/08/20 1606         ADL UCSD   ADL Phase Entry     SOB Score total 39           CAT Score   CAT Score 31           mMRC Score   mMRC Score 3           UCSD: Self-administered rating of dyspnea associated with activities of daily living (ADLs) 6-point scale (0 = "not at all" to 5 = "maximal or unable to do because of breathlessness")  Scoring Scores range from 0 to 120.  Minimally important difference is 5 units  CAT: CAT can identify the health impairment of COPD patients and is better correlated with disease progression.  CAT has a scoring range of zero to 40. The CAT score is classified into four groups of low (less than 10), medium (10 - 20), high (21-30) and very high (31-40) based on the impact level of disease on health status. A CAT score over 10 suggests significant symptoms.  A worsening CAT score could be explained by an exacerbation, poor medication adherence, poor inhaler technique, or progression of COPD or comorbid conditions.  CAT MCID is 2 points  mMRC: mMRC  (Modified Medical Research Council) Dyspnea Scale is used to assess the degree of baseline functional disability in patients of respiratory disease due to dyspnea. No minimal important difference is established. A decrease in score of 1 point or greater is considered a positive change.   Pulmonary Function Assessment:   Exercise Target Goals: Exercise Program Goal: Individual exercise prescription set using results from initial 6 min walk test and THRR while considering  patient's activity barriers  and safety.   Exercise Prescription Goal: Initial exercise prescription builds to 30-45 minutes a day of aerobic activity, 2-3 days per week.  Home exercise guidelines will be given to patient during program as part of exercise prescription that the participant will acknowledge.  Activity Barriers & Risk Stratification:  Activity Barriers & Cardiac Risk Stratification - 01/08/20 0956      Activity Barriers & Cardiac Risk Stratification   Activity Barriers Back Problems;Shortness of Breath;Deconditioning;Muscular Weakness           6 Minute Walk:  6 Minute Walk    Row Name 01/08/20 1547         6 Minute Walk   Phase Initial     Distance 1280 feet     Walk Time 6 minutes     # of Rest Breaks 0     MPH 2.42     METS 2.96     RPE 13     Perceived Dyspnea  2     VO2 Peak 10.35     Symptoms No     Resting HR 70 bpm     Resting BP 118/62     Resting Oxygen Saturation  95 %     Exercise Oxygen Saturation  during 6 min walk 92 %     Max Ex. HR 98 bpm     Max Ex. BP 118/60     2 Minute Post BP 112/60           Interval HR   1 Minute HR 89     2 Minute HR 90     3 Minute HR 96     4 Minute HR 96     5 Minute HR 96     6 Minute HR 98     2 Minute Post HR 96     Interval Heart Rate? Yes           Interval Oxygen   Interval Oxygen? Yes     Baseline Oxygen Saturation % 95 %     1 Minute Oxygen Saturation % 97 %     1 Minute Liters of Oxygen 2 L     2 Minute Oxygen  Saturation % 97 %     2 Minute Liters of Oxygen 2 L     3 Minute Oxygen Saturation % 97 %     3 Minute Liters of Oxygen 2 L     4 Minute Oxygen Saturation % 98 %     4 Minute Liters of Oxygen 2 L     5 Minute Oxygen Saturation % 94 %     5 Minute Liters of Oxygen 2 L     6 Minute Oxygen Saturation % 92 %     6 Minute Liters of Oxygen 2 L     2 Minute Post Oxygen Saturation % 96 %     2 Minute Post Liters of Oxygen 2 L            Oxygen Initial Assessment:  Oxygen Initial Assessment - 01/08/20 1544      Home Oxygen   Home Oxygen Device Portable Concentrator;Home Concentrator    Sleep Oxygen Prescription Continuous    Liters per minute 2    Home Exercise Oxygen Prescription Continuous    Liters per minute 2    Home Resting Oxygen Prescription None    Compliance with Home Oxygen Use Yes      Initial 6 min Walk   Oxygen  Used Continuous    Liters per minute 2      Program Oxygen Prescription   Program Oxygen Prescription Continuous    Liters per minute 2      Intervention   Short Term Goals To learn and exhibit compliance with exercise, home and travel O2 prescription;To learn and understand importance of monitoring SPO2 with pulse oximeter and demonstrate accurate use of the pulse oximeter.;To learn and understand importance of maintaining oxygen saturations>88%;To learn and demonstrate proper pursed lip breathing techniques or other breathing techniques.;To learn and demonstrate proper use of respiratory medications    Long  Term Goals Exhibits compliance with exercise, home and travel O2 prescription;Verbalizes importance of monitoring SPO2 with pulse oximeter and return demonstration;Maintenance of O2 saturations>88%;Exhibits proper breathing techniques, such as pursed lip breathing or other method taught during program session;Compliance with respiratory medication;Demonstrates proper use of MDI's           Oxygen Re-Evaluation:  Oxygen Re-Evaluation    Row Name  01/18/20 0809 02/08/20 0807 03/05/20 0755         Program Oxygen Prescription   Program Oxygen Prescription Continuous Continuous Continuous     Liters per minute 2 2 2            Home Oxygen   Home Oxygen Device Portable Concentrator;Home Concentrator Portable Concentrator;Home Concentrator Portable Concentrator;Home Concentrator     Sleep Oxygen Prescription Continuous Continuous Continuous     Liters per minute 2 2 2      Home Exercise Oxygen Prescription Continuous Continuous Continuous     Liters per minute 2 2 2      Home Resting Oxygen Prescription None None None     Compliance with Home Oxygen Use Yes Yes Yes           Goals/Expected Outcomes   Short Term Goals To learn and exhibit compliance with exercise, home and travel O2 prescription;To learn and understand importance of monitoring SPO2 with pulse oximeter and demonstrate accurate use of the pulse oximeter.;To learn and understand importance of maintaining oxygen saturations>88%;To learn and demonstrate proper pursed lip breathing techniques or other breathing techniques.;To learn and demonstrate proper use of respiratory medications To learn and exhibit compliance with exercise, home and travel O2 prescription;To learn and understand importance of monitoring SPO2 with pulse oximeter and demonstrate accurate use of the pulse oximeter.;To learn and understand importance of maintaining oxygen saturations>88%;To learn and demonstrate proper pursed lip breathing techniques or other breathing techniques.;To learn and demonstrate proper use of respiratory medications To learn and exhibit compliance with exercise, home and travel O2 prescription;To learn and understand importance of monitoring SPO2 with pulse oximeter and demonstrate accurate use of the pulse oximeter.;To learn and understand importance of maintaining oxygen saturations>88%;To learn and demonstrate proper pursed lip breathing techniques or other breathing techniques.;To  learn and demonstrate proper use of respiratory medications     Long  Term Goals Exhibits compliance with exercise, home and travel O2 prescription;Verbalizes importance of monitoring SPO2 with pulse oximeter and return demonstration;Maintenance of O2 saturations>88%;Exhibits proper breathing techniques, such as pursed lip breathing or other method taught during program session;Compliance with respiratory medication;Demonstrates proper use of MDI's Exhibits compliance with exercise, home and travel O2 prescription;Verbalizes importance of monitoring SPO2 with pulse oximeter and return demonstration;Maintenance of O2 saturations>88%;Exhibits proper breathing techniques, such as pursed lip breathing or other method taught during program session;Compliance with respiratory medication;Demonstrates proper use of MDI's Exhibits compliance with exercise, home and travel O2 prescription;Verbalizes importance of monitoring SPO2 with pulse oximeter  and return demonstration;Maintenance of O2 saturations>88%;Exhibits proper breathing techniques, such as pursed lip breathing or other method taught during program session;Compliance with respiratory medication;Demonstrates proper use of MDI's     Goals/Expected Outcomes compliance and understanding of oxygen saturation and pursed lip breathing compliance and understanding of oxygen saturation and pursed lip breathing compliance and understanding of oxygen saturation and pursed lip breathing            Oxygen Discharge (Final Oxygen Re-Evaluation):  Oxygen Re-Evaluation - 03/05/20 0755      Program Oxygen Prescription   Program Oxygen Prescription Continuous    Liters per minute 2      Home Oxygen   Home Oxygen Device Portable Concentrator;Home Concentrator    Sleep Oxygen Prescription Continuous    Liters per minute 2    Home Exercise Oxygen Prescription Continuous    Liters per minute 2    Home Resting Oxygen Prescription None    Compliance with Home  Oxygen Use Yes      Goals/Expected Outcomes   Short Term Goals To learn and exhibit compliance with exercise, home and travel O2 prescription;To learn and understand importance of monitoring SPO2 with pulse oximeter and demonstrate accurate use of the pulse oximeter.;To learn and understand importance of maintaining oxygen saturations>88%;To learn and demonstrate proper pursed lip breathing techniques or other breathing techniques.;To learn and demonstrate proper use of respiratory medications    Long  Term Goals Exhibits compliance with exercise, home and travel O2 prescription;Verbalizes importance of monitoring SPO2 with pulse oximeter and return demonstration;Maintenance of O2 saturations>88%;Exhibits proper breathing techniques, such as pursed lip breathing or other method taught during program session;Compliance with respiratory medication;Demonstrates proper use of MDI's    Goals/Expected Outcomes compliance and understanding of oxygen saturation and pursed lip breathing           Initial Exercise Prescription:  Initial Exercise Prescription - 01/11/20 0800      Date of Initial Exercise RX and Referring Provider   Date 01/08/20    Referring Provider Dr. Olena Heckle    Expected Discharge Date 03/14/20      Oxygen   Oxygen Continuous    Liters 2      NuStep   Level 1    Minutes 15      Arm Ergometer   Level 1    Minutes 15      Prescription Details   Frequency (times per week) 2    Duration Progress to 30 minutes of continuous aerobic without signs/symptoms of physical distress      Intensity   THRR 40-80% of Max Heartrate 62-124    Ratings of Perceived Exertion 11-13    Perceived Dyspnea 0-4      Progression   Progression Continue to progress workloads to maintain intensity without signs/symptoms of physical distress.      Resistance Training   Training Prescription Yes    Weight orange bands    Reps 10-15           Perform Capillary Blood Glucose checks as  needed.  Exercise Prescription Changes:  Exercise Prescription Changes    Row Name 01/18/20 0800 01/23/20 1500 02/06/20 1500 02/20/20 1500       Response to Exercise   Blood Pressure (Admit) 116/66 114/60 124/74 118/72    Blood Pressure (Exercise) 122/60 100/54 130/70 140/72    Blood Pressure (Exit) 108/70 110/64 116/70 120/60    Heart Rate (Admit) 106 bpm 105 bpm 94 bpm 110 bpm    Heart Rate (  Exercise) 105 bpm 106 bpm 99 bpm 115 bpm    Heart Rate (Exit) 99 bpm 103 bpm 98 bpm 113 bpm    Oxygen Saturation (Admit) 90 % 95 % 93 % 95 %    Oxygen Saturation (Exercise) 92 % 93 % 93 % 94 %    Oxygen Saturation (Exit) 94 % 95 % 94 % 94 %    Rating of Perceived Exertion (Exercise) 13 14 13 15     Perceived Dyspnea (Exercise) 3 3 2 3     Duration Continue with 30 min of aerobic exercise without signs/symptoms of physical distress. Continue with 30 min of aerobic exercise without signs/symptoms of physical distress. Continue with 30 min of aerobic exercise without signs/symptoms of physical distress. Continue with 30 min of aerobic exercise without signs/symptoms of physical distress.    Intensity Other (comment)  40%-80% HR Max THRR unchanged THRR unchanged THRR unchanged         Progression   Progression Continue to progress workloads to maintain intensity without signs/symptoms of physical distress. Continue to progress workloads to maintain intensity without signs/symptoms of physical distress. Continue to progress workloads to maintain intensity without signs/symptoms of physical distress. Continue to progress workloads to maintain intensity without signs/symptoms of physical distress.    Average METs 2 -- -- --         Resistance Training   Training Prescription Yes Yes Yes Yes    Weight Orange bands Orange bands Orange bands Orange bands    Reps 10-15 10-15 10-15 10-15    Time 10 Minutes 10 Minutes 10 Minutes 10 Minutes         Oxygen   Oxygen Continuous Continuous Continuous Continuous     Liters 2 2 2 2          NuStep   Level 2 2 3 3     SPM 80 80 80 80    Minutes 15 15 15 15     METs 2.1 1.9 2.1 2.1         Arm Ergometer   Level 1 1.5 2 2.5    RPM 46 42 49 51    Minutes 15 15 15 15            Exercise Comments:  Exercise Comments    Row Name 01/16/20 0804           Exercise Comments Pt completed first day of exercise and tolerated well with no complaints              Exercise Goals and Review:  Exercise Goals    Row Name 01/08/20 9518 01/08/20 1553           Exercise Goals   Increase Physical Activity Yes Yes      Intervention Provide advice, education, support and counseling about physical activity/exercise needs.;Develop an individualized exercise prescription for aerobic and resistive training based on initial evaluation findings, risk stratification, comorbidities and participant's personal goals. Provide advice, education, support and counseling about physical activity/exercise needs.;Develop an individualized exercise prescription for aerobic and resistive training based on initial evaluation findings, risk stratification, comorbidities and participant's personal goals.      Expected Outcomes Short Term: Attend rehab on a regular basis to increase amount of physical activity.;Long Term: Add in home exercise to make exercise part of routine and to increase amount of physical activity.;Long Term: Exercising regularly at least 3-5 days a week. Short Term: Attend rehab on a regular basis to increase amount of physical activity.;Long Term: Add in home  exercise to make exercise part of routine and to increase amount of physical activity.;Long Term: Exercising regularly at least 3-5 days a week.      Increase Strength and Stamina Yes Yes      Intervention Provide advice, education, support and counseling about physical activity/exercise needs.;Develop an individualized exercise prescription for aerobic and resistive training based on initial evaluation  findings, risk stratification, comorbidities and participant's personal goals. Provide advice, education, support and counseling about physical activity/exercise needs.;Develop an individualized exercise prescription for aerobic and resistive training based on initial evaluation findings, risk stratification, comorbidities and participant's personal goals.      Expected Outcomes Short Term: Increase workloads from initial exercise prescription for resistance, speed, and METs.;Short Term: Perform resistance training exercises routinely during rehab and add in resistance training at home;Long Term: Improve cardiorespiratory fitness, muscular endurance and strength as measured by increased METs and functional capacity (6MWT) Short Term: Increase workloads from initial exercise prescription for resistance, speed, and METs.;Short Term: Perform resistance training exercises routinely during rehab and add in resistance training at home;Long Term: Improve cardiorespiratory fitness, muscular endurance and strength as measured by increased METs and functional capacity (6MWT)      Able to understand and use rate of perceived exertion (RPE) scale Yes Yes      Intervention Provide education and explanation on how to use RPE scale Provide education and explanation on how to use RPE scale      Expected Outcomes Short Term: Able to use RPE daily in rehab to express subjective intensity level;Long Term:  Able to use RPE to guide intensity level when exercising independently Short Term: Able to use RPE daily in rehab to express subjective intensity level;Long Term:  Able to use RPE to guide intensity level when exercising independently      Able to understand and use Dyspnea scale Yes Yes      Intervention Provide education and explanation on how to use Dyspnea scale Provide education and explanation on how to use Dyspnea scale      Expected Outcomes Short Term: Able to use Dyspnea scale daily in rehab to express subjective  sense of shortness of breath during exertion;Long Term: Able to use Dyspnea scale to guide intensity level when exercising independently Short Term: Able to use Dyspnea scale daily in rehab to express subjective sense of shortness of breath during exertion;Long Term: Able to use Dyspnea scale to guide intensity level when exercising independently      Knowledge and understanding of Target Heart Rate Range (THRR) Yes Yes      Intervention Provide education and explanation of THRR including how the numbers were predicted and where they are located for reference Provide education and explanation of THRR including how the numbers were predicted and where they are located for reference      Expected Outcomes Short Term: Able to state/look up THRR;Long Term: Able to use THRR to govern intensity when exercising independently;Short Term: Able to use daily as guideline for intensity in rehab Short Term: Able to state/look up THRR;Long Term: Able to use THRR to govern intensity when exercising independently;Short Term: Able to use daily as guideline for intensity in rehab      Understanding of Exercise Prescription Yes Yes      Intervention Provide education, explanation, and written materials on patient's individual exercise prescription Provide education, explanation, and written materials on patient's individual exercise prescription      Expected Outcomes Short Term: Able to explain program exercise prescription;Long Term: Able  to explain home exercise prescription to exercise independently Short Term: Able to explain program exercise prescription;Long Term: Able to explain home exercise prescription to exercise independently             Exercise Goals Re-Evaluation :  Exercise Goals Re-Evaluation    Row Name 01/18/20 0102 02/08/20 0804 03/05/20 0751         Exercise Goal Re-Evaluation   Exercise Goals Review Increase Physical Activity;Increase Strength and Stamina;Able to understand and use rate of  perceived exertion (RPE) scale;Able to understand and use Dyspnea scale;Knowledge and understanding of Target Heart Rate Range (THRR);Understanding of Exercise Prescription Increase Physical Activity;Increase Strength and Stamina;Able to understand and use rate of perceived exertion (RPE) scale;Able to understand and use Dyspnea scale;Knowledge and understanding of Target Heart Rate Range (THRR);Understanding of Exercise Prescription Increase Physical Activity;Increase Strength and Stamina;Able to understand and use rate of perceived exertion (RPE) scale;Able to understand and use Dyspnea scale;Knowledge and understanding of Target Heart Rate Range (THRR);Understanding of Exercise Prescription     Comments Pt has completed 1 exercise session and tolerated well. He is exercising at 2.1 METS on the Nustep and 46 RPM's on the Arm Ergometer. Will continue to monitor and progress as able Pt has completed 7 exercise sessions and has been making steady progressions with workloads and METS. He is exercising at 2.1 METS on the Nustep and 49 WATTS on the Arm Ergometer. Will continue to monitor and progress as he is able. Solon has completed 12 exercise sessions and has been slow to make progressions as of late. He started out doing pretty well but his METS have been averaging the same over the last few weeks. He has a lot of family stuff going on, but physically he has not had any complaints with exercise. He is making slow increases with workload levels on the Nustep and Arm Ergometer. He is exercising at 2.2 METS on the Nustep and 48 WATTS on the Arm ergometer.     Expected Outcomes Through exercise at rehab and home the patient will decrease shortness of breath with daily activities and feel confident in carrying out an exercise regimn at home. Through exercise at rehab and home the patient will decrease shortness of breath with daily activities and feel confident in carrying out an exercise regimn at home. Through  exercise at rehab and home the patient will decrease shortness of breath with daily activities and feel confident in carrying out an exercise regimn at home.            Discharge Exercise Prescription (Final Exercise Prescription Changes):  Exercise Prescription Changes - 02/20/20 1500      Response to Exercise   Blood Pressure (Admit) 118/72    Blood Pressure (Exercise) 140/72    Blood Pressure (Exit) 120/60    Heart Rate (Admit) 110 bpm    Heart Rate (Exercise) 115 bpm    Heart Rate (Exit) 113 bpm    Oxygen Saturation (Admit) 95 %    Oxygen Saturation (Exercise) 94 %    Oxygen Saturation (Exit) 94 %    Rating of Perceived Exertion (Exercise) 15    Perceived Dyspnea (Exercise) 3    Duration Continue with 30 min of aerobic exercise without signs/symptoms of physical distress.    Intensity THRR unchanged      Progression   Progression Continue to progress workloads to maintain intensity without signs/symptoms of physical distress.      Resistance Training   Training Prescription Yes  Weight Orange bands    Reps 10-15    Time 10 Minutes      Oxygen   Oxygen Continuous    Liters 2      NuStep   Level 3    SPM 80    Minutes 15    METs 2.1      Arm Ergometer   Level 2.5    RPM 51    Minutes 15           Nutrition:  Target Goals: Understanding of nutrition guidelines, daily intake of sodium 1500mg , cholesterol 200mg , calories 30% from fat and 7% or less from saturated fats, daily to have 5 or more servings of fruits and vegetables.  Biometrics:  Pre Biometrics - 01/08/20 1554      Pre Biometrics   Grip Strength 19 kg            Nutrition Therapy Plan and Nutrition Goals:  Nutrition Therapy & Goals - 01/30/20 1455      Nutrition Therapy   Diet Generally healthful      Personal Nutrition Goals   Nutrition Goal Eat more fruits for snacks    Personal Goal #2 Choose 3 whole grains daily    Personal Goal #3 Continue eating beans and veggies with  dinner      Intervention Plan   Intervention Prescribe, educate and counsel regarding individualized specific dietary modifications aiming towards targeted core components such as weight, hypertension, lipid management, diabetes, heart failure and other comorbidities.    Expected Outcomes Short Term Goal: Understand basic principles of dietary content, such as calories, fat, sodium, cholesterol and nutrients.           Nutrition Assessments:  Nutrition Assessments - 01/17/20 1340      Rate Your Plate Scores   Pre Score 43          MEDIFICTS Score Key:  ?70 Need to make dietary changes   40-70 Heart Healthy Diet  ? 40 Therapeutic Level Cholesterol Diet   Picture Your Plate Scores:  <64 Unhealthy dietary pattern with much room for improvement.  41-50 Dietary pattern unlikely to meet recommendations for good health and room for improvement.  51-60 More healthful dietary pattern, with some room for improvement.   >60 Healthy dietary pattern, although there may be some specific behaviors that could be improved.    Nutrition Goals Re-Evaluation:  Nutrition Goals Re-Evaluation    [place] Name 01/30/20 1456 03/04/20 1345           Goals   Current Weight 183 lb (83 kg) 183 lb (83 kg)      Nutrition Goal Eat more fruits for snacks Eat more fruits for snacks             Personal Goal #2 Re-Evaluation   Personal Goal #2 Choose 3 whole grains daily Choose 3 whole grains daily             Personal Goal #3 Re-Evaluation   Personal Goal #3 Continue eating beans and veggies with dinner Continue eating beans and veggies with dinner             Nutrition Goals Discharge (Final Nutrition Goals Re-Evaluation):  Nutrition Goals Re-Evaluation - 03/04/20 1345      Goals   Current Weight 183 lb (83 kg)    Nutrition Goal Eat more fruits for snacks      Personal Goal #2 Re-Evaluation   Personal Goal #2 Choose 3 whole grains daily  Personal Goal #3 Re-Evaluation    Personal Goal #3 Continue eating beans and veggies with dinner           Psychosocial: Target Goals: Acknowledge presence or absence of significant depression and/or stress, maximize coping skills, provide positive support system. Participant is able to verbalize types and ability to use techniques and skills needed for reducing stress and depression.  Initial Review & Psychosocial Screening:  Initial Psych Review & Screening - 01/08/20 1010      Initial Review   Current issues with None Identified      Family Dynamics   Good Support System? Yes      Barriers   Psychosocial barriers to participate in program There are no identifiable barriers or psychosocial needs.;The patient should benefit from training in stress management and relaxation.      Screening Interventions   Interventions Encouraged to exercise;Provide feedback about the scores to participant           Quality of Life Scores:  Scores of 19 and below usually indicate a poorer quality of life in these areas.  A difference of  2-3 points is a clinically meaningful difference.  A difference of 2-3 points in the total score of the Quality of Life Index has been associated with significant improvement in overall quality of life, self-image, physical symptoms, and general health in studies assessing change in quality of life.  PHQ-9: Recent Review Flowsheet Data    Depression screen [facility] 2/9 01/08/2020 01/08/2020   Decreased Interest 0 0   Down, Depressed, Hopeless 0 0   PHQ - 2 Score 0 0   Altered sleeping 0 -   Tired, decreased energy 3 -   Change in appetite 0 -   Feeling bad or failure about yourself  0 -   Trouble concentrating 0 -   Moving slowly or fidgety/restless 0 -   Suicidal thoughts 0 -   PHQ-9 Score 3 -   Difficult doing work/chores Somewhat difficult -     Interpretation of Total Score  Total Score Depression Severity:  1-4 = Minimal depression, 5-9 = Mild depression, 10-14 = Moderate  depression, 15-19 = Moderately severe depression, 20-27 = Severe depression   Psychosocial Evaluation and Intervention:  Psychosocial Evaluation - 01/17/20 1132      Psychosocial Evaluation & Interventions   Expected Outcomes Blease will continue to display hopeful and positive outlook on life with no psychosocial barriers.           Psychosocial Re-Evaluation:  Psychosocial Re-Evaluation    [place] Name 01/17/20 1129 01/17/20 1132 02/12/20 1342 02/29/20 1121       Psychosocial Re-Evaluation   Current issues with None Identified -- None Identified None Identified    Comments Bentzion has strong faith foundations with supportive church family. -- No psychosocial concerns identified at this time. No psychosocial concerns identified at this time.    Expected Outcomes -- Isauro will continue to display hopeful and positive outlook on life with no psychosocial barriers. For Daylon to be free of psychosocial concerns while participating in pulmonary rehab. For Jaceion to continue to be free of psychosocial concerns while participating in pulmonary rehab.    Interventions -- Encouraged to attend Pulmonary Rehabilitation for the exercise -- --    Continue Psychosocial Services  -- No Follow up required No Follow up required No Follow up required           Psychosocial Discharge (Final Psychosocial Re-Evaluation):  Psychosocial Re-Evaluation - 02/29/20 1121  Psychosocial Re-Evaluation   Current issues with None Identified    Comments No psychosocial concerns identified at this time.    Expected Outcomes For Dereon to continue to be free of psychosocial concerns while participating in pulmonary rehab.    Continue Psychosocial Services  No Follow up required           Education: Education Goals: Education classes will be provided on a weekly basis, covering required topics. Participant will state understanding/return demonstration of topics presented.  Learning Barriers/Preferences:   Learning Barriers/Preferences - 01/08/20 1011      Learning Barriers/Preferences   Learning Preferences Group Instruction;Computer/Internet;Individual Instruction;Verbal Instruction;Video;Written Material           Education Topics: Risk Factor Reduction:  -Group instruction that is supported by a PowerPoint presentation. Instructor discusses the definition of a risk factor, different risk factors for pulmonary disease, and how the heart and lungs work together.   Flowsheet Row PULMONARY REHAB OTHER RESPIRATORY from 02/29/2020 in [place]  Date 01/18/20  Educator Handout  Instruction Review Code 1- Verbalizes Understanding      Nutrition for Pulmonary Patient:  -Group instruction provided by PowerPoint slides, verbal discussion, and written materials to support subject matter. The instructor gives an explanation and review of healthy diet recommendations, which includes a discussion on weight management, recommendations for fruit and vegetable consumption, as well as protein, fluid, caffeine, fiber, sodium, sugar, and alcohol. Tips for eating when patients are short of breath are discussed.   Pursed Lip Breathing:  -Group instruction that is supported by demonstration and informational handouts. Instructor discusses the benefits of pursed lip and diaphragmatic breathing and detailed demonstration on how to preform both.     Oxygen Safety:  -Group instruction provided by PowerPoint, verbal discussion, and written material to support subject matter. There is an overview of "What is Oxygen" and "Why do we need it".  Instructor also reviews how to create a safe environment for oxygen use, the importance of using oxygen as prescribed, and the risks of noncompliance. There is a brief discussion on traveling with oxygen and resources the patient may utilize.   Oxygen Equipment:  -Group instruction provided by [facility] Staff utilizing handouts, written  materials, and equipment demonstrations.   Signs and Symptoms:  -Group instruction provided by written material and verbal discussion to support subject matter. Warning signs and symptoms of infection, stroke, and heart attack are reviewed and when to call the physician/911 reinforced. Tips for preventing the spread of infection discussed.   Advanced Directives:  -Group instruction provided by verbal instruction and written material to support subject matter. Instructor reviews Advanced Directive laws and proper instruction for filling out document.   Pulmonary Video:  -Group video education that reviews the importance of medication and oxygen compliance, exercise, good nutrition, pulmonary hygiene, and pursed lip and diaphragmatic breathing for the pulmonary patient.   Exercise for the Pulmonary Patient:  -Group instruction that is supported by a PowerPoint presentation. Instructor discusses benefits of exercise, core components of exercise, frequency, duration, and intensity of an exercise routine, importance of utilizing pulse oximetry during exercise, safety while exercising, and options of places to exercise outside of rehab.   Flowsheet Row PULMONARY REHAB OTHER RESPIRATORY from 02/29/2020 in [place]  Date 01/25/20  Educator handout      Pulmonary Medications:  -Verbally interactive group education provided by instructor with focus on inhaled medications and proper administration. Flowsheet Row PULMONARY REHAB OTHER RESPIRATORY  from 02/29/2020 in [place]  Date 02/29/20  Educator Handout      Anatomy and Physiology of the Respiratory System and Intimacy:  -Group instruction provided by PowerPoint, verbal discussion, and written material to support subject matter. Instructor reviews respiratory cycle and anatomical components of the respiratory system and their functions. Instructor also reviews differences in  obstructive and restrictive respiratory diseases with examples of each. Intimacy, Sex, and Sexuality differences are reviewed with a discussion on how relationships can change when diagnosed with pulmonary disease. Common sexual concerns are reviewed.   MD DAY -A group question and answer session with a medical doctor that allows participants to ask questions that relate to their pulmonary disease state.   OTHER EDUCATION -Group or individual verbal, written, or video instructions that support the educational goals of the pulmonary rehab program. [place] from 02/29/2020 in [place]  Date 02/01/20  Educator handout  [Beat a Sedentary Lifestyls]      Holiday Eating Survival Tips:  -Group instruction provided by PowerPoint slides, verbal discussion, and written materials to support subject matter. The instructor gives patients tips, tricks, and techniques to help them not only survive but enjoy the holidays despite the onslaught of food that accompanies the holidays.   Knowledge Questionnaire Score:  Knowledge Questionnaire Score - 01/08/20 1559      Knowledge Questionnaire Score   Pre Score 17/18           Core Components/Risk Factors/Patient Goals at Admission:  Personal Goals and Risk Factors at Admission - 01/08/20 1014      Core Components/Risk Factors/Patient Goals on Admission    Weight Management Weight Maintenance    Improve shortness of breath with ADL's Yes    Intervention Provide education, individualized exercise plan and daily activity instruction to help decrease symptoms of SOB with activities of daily living.    Expected Outcomes Short Term: Improve cardiorespiratory fitness to achieve a reduction of symptoms when performing ADLs;Long Term: Be able to perform more ADLs without symptoms or delay the onset of symptoms           Core Components/Risk Factors/Patient Goals Review:   Goals and  Risk Factor Review    Row Name 01/17/20 1132 02/12/20 1344 02/29/20 1124         Core Components/Risk Factors/Patient Goals Review   Personal Goals Review Weight Management/Obesity;Develop more efficient breathing techniques such as purse lipped breathing and diaphragmatic breathing and practicing self-pacing with activity.;Improve shortness of breath with ADL's;Increase knowledge of respiratory medications and ability to use respiratory devices properly. Weight Management/Obesity;Increase knowledge of respiratory medications and ability to use respiratory devices properly.;Improve shortness of breath with ADL's;Develop more efficient breathing techniques such as purse lipped breathing and diaphragmatic breathing and practicing self-pacing with activity. Improve shortness of breath with ADL's;Increase knowledge of respiratory medications and ability to use respiratory devices properly.;Develop more efficient breathing techniques such as purse lipped breathing and diaphragmatic breathing and practicing self-pacing with activity.     Review Rondy completed 1 exercise session.  Not enough information at this point to assess progress.  Anticipate that Avrum who is committed to getting healthy and strong so that he will not need lung transplant will make progress toward admission goals. Kaesen cannot see a major improvement in his strength, stamina, or shortness of breath, but he is not exercising outside of pulmonary rehab. Is getting stronger, is to be evaluated @ [facility] for a possible  lung transplant.  Exercising @ level 4 on nustep and level 2.5 on the arm ergomenter.     Expected Outcomes See Admission Goals That Avyaan will see an improvement in his strength, stamina, and shortness of breath in the next 30 days and that he will exercise at home 2-3 days/week in addition to exercising in pulmonary rehab. For Davidson to continue to progress and become stronger and exercise at home after graduation from program.             Core Components/Risk Factors/Patient Goals at Discharge (Final Review):   Goals and Risk Factor Review - 02/29/20 1124      Core Components/Risk Factors/Patient Goals Review   Personal Goals Review Improve shortness of breath with ADL's;Increase knowledge of respiratory medications and ability to use respiratory devices properly.;Develop more efficient breathing techniques such as purse lipped breathing and diaphragmatic breathing and practicing self-pacing with activity.    Review Is getting stronger, is to be evaluated @ [facility] for a possible lung transplant.  Exercising @ level 4 on nustep and level 2.5 on the arm ergomenter.    Expected Outcomes For Xaine to continue to progress and become stronger and exercise at home after graduation from program.           ITP Comments:   Comments: ITP REVIEW Pt is making expected progress toward pulmonary rehab goals after completing 12 sessions. Recommend continued exercise, life style modification, education, and utilization of breathing techniques to increase stamina and strength and decrease shortness of breath with exertion.

## 2020-03-05 NOTE — Progress Notes (Signed)
Daily Session Note  Patient Details  Name: [name] MRN: [id number] Date of Birth: 12/25/1955 Referring Provider:   April Manson Pulmonary Rehab Walk Test from 01/08/2020 in [place]  Referring Provider Dr. Olena Heckle      Encounter Date: 03/05/2020  Check In:  Session Check In - 03/05/20 1529      Check-In   Supervising physician immediately available to respond to emergencies [facility] immediately available           Capillary Blood Glucose: No results found for this or any previous visit (from the past 24 hour(s)).   Exercise Prescription Changes - 03/05/20 1500      Response to Exercise   Blood Pressure (Admit) 120/66    Blood Pressure (Exercise) 138/70    Blood Pressure (Exit) 116/62    Heart Rate (Admit) 95 bpm    Heart Rate (Exercise) 105 bpm    Heart Rate (Exit) 94 bpm    Oxygen Saturation (Admit) 93 %    Oxygen Saturation (Exercise) 92 %    Oxygen Saturation (Exit) 94 %    Rating of Perceived Exertion (Exercise) 14    Perceived Dyspnea (Exercise) 3    Duration Progress to 30 minutes of  aerobic without signs/symptoms of physical distress    Intensity THRR unchanged      Progression   Progression Continue to progress workloads to maintain intensity without signs/symptoms of physical distress.      Resistance Training   Training Prescription Yes    Weight Orange bands    Reps 10-15    Time 10 Minutes      Oxygen   Oxygen Continuous    Liters 2      NuStep   Level 5    SPM 80    Minutes 15    METs 2.7      Arm Ergometer   Level 2.5    RPM 51    Minutes 15           Social History   Tobacco Use  Smoking Status Never Smoker  Smokeless Tobacco Never Used    Goals Met:  Proper associated with RPD/PD & O2 Sat Exercise tolerated well Strength training completed today  Goals Unmet:  Not Applicable  Comments: Service time is from 0263 to 1500.    Dr. Fransico Him is Medical Director  for Cardiac Rehab at [facility].

## 2020-03-06 NOTE — Unmapped (Signed)
TFC called patient to let him know it may be better to go ahead and start process at [facility] if he knows that he will have [facility] and it is out of network with [facility]

## 2020-03-07 ENCOUNTER — Encounter (HOSPITAL_COMMUNITY): Payer: PPO

## 2020-03-07 NOTE — Unmapped (Signed)
TFC spoke with patient and he is aware referral will be closed. Patient will be referred to [facility] since patient will have [employer] in Jan 2022 and [facility] is out of network    Patient is not financially cleared for time

## 2020-03-07 NOTE — Unmapped (Signed)
[facility] Refill Coordination Note    Specialty Medication(s) to be Shipped:   CF/Pulmonary: -Nebulized colistin (150mg  vials) and supply kit  Other medication(s) to be shipped: BD Luer-Lok Syringes, LC Plus neb cups, Sodium Chloride 10% neb solu & Sterile Water     Jordan [name], DOB: 1955/08/04  Phone: [phone] (home)     All above HIPAA information was verified with patient.     Was a Nurse, learning disability used for this call? No    Completed refill call assessment today to schedule patient's medication shipment from the [facility] [phone]).       Specialty medication(s) and dose(s) confirmed: Regimen is correct and unchanged.   Changes to medications: Julious reports no changes at this time.  Changes to insurance: No  Questions for the pharmacist: No    Confirmed patient received Welcome Packet with first shipment. The patient will receive a drug information handout for each medication shipped and additional FDA Medication Guides as required.       DISEASE/MEDICATION-SPECIFIC INFORMATION        For CF patients: CF [facility] Active? No-not enrolled    SPECIALTY MEDICATION ADHERENCE     Medication Adherence    Patient reported X missed doses in the last month: 0  Specialty Medication: Tobramycin 300mg /54ml  Patient is on additional specialty medications: Yes  Additional Specialty Medications: Colistimethate 150mg   Patient Reported Additional Medication X Missed Doses in the Last Month: 0  Patient is on more than two specialty medications: No  Informant: patient  Reliability of informant: reliable  Support network for adherence: family member        Colistimethate 150 mg: 0 days of medicine on hand (Possibly scheduled to start 03/21/20)    SHIPPING     Shipping address confirmed in Epic.     Delivery Scheduled: Yes, Expected medication delivery date: 03/19/2020.     Medication will be delivered via UPS to the prescription address in Epic WAM.    Jordan [name] Shared [facility] Specialty Technician

## 2020-03-12 ENCOUNTER — Encounter (HOSPITAL_COMMUNITY)
Admission: RE | Admit: 2020-03-12 | Discharge: 2020-03-12 | Disposition: A | Discharge status: Home / Self Care | Payer: PPO | Source: Ambulatory Visit | Attending: Cardiology | Admitting: Cardiology

## 2020-03-12 ENCOUNTER — Encounter (HOSPITAL_COMMUNITY): Payer: PPO

## 2020-03-12 ENCOUNTER — Other Ambulatory Visit: Payer: Self-pay

## 2020-03-12 DIAGNOSIS — J479 Bronchiectasis, uncomplicated: Secondary | ICD-10-CM | POA: Diagnosis not present

## 2020-03-12 NOTE — Progress Notes (Signed)
Daily Session Note  Patient Details  Name: [name] MRN: [id number] Date of Birth: 1956/01/16 Referring Provider:   April Manson Pulmonary Rehab Walk Test from 01/08/2020 in [place]  Referring Provider Dr. Olena Heckle      Encounter Date: 03/12/2020  Check In:  Session Check In - 03/12/20 1446      Check-In   Supervising physician immediately available to respond to emergencies [facility] immediately available    Physician(s) Dr. Cruzita Lederer    Location [facility]    Staff Present Rosebud Poles, RN, BSN;Cailee Blanke Ysidro Evert, RN;Jessica Hassell Done, MS, ACSM-CEP, Exercise Physiologist    Virtual Visit No    Medication changes reported     No    Fall or balance concerns reported    No    Tobacco Cessation No Change    Warm-up and Cool-down Performed on first and last piece of equipment    Resistance Training Performed Yes    VAD Patient? No    PAD/SET Patient? No      Pain Assessment   Currently in Pain? No/denies    Pain Score 0-No pain    Multiple Pain Sites No           Capillary Blood Glucose: No results found for this or any previous visit (from the past 24 hour(s)).    Social History   Tobacco Use  Smoking Status Never Smoker  Smokeless Tobacco Never Used    Goals Met:  Exercise tolerated well No report of cardiac concerns or symptoms Strength training completed today  Goals Unmet:  Not Applicable  Comments: Service time is from 1355 to 1508    Dr. Fransico Him is Medical Director for Cardiac Rehab at [facility].

## 2020-03-12 NOTE — Unmapped (Signed)
Fax requested CMN to [facility] supply.

## 2020-03-14 ENCOUNTER — Encounter (HOSPITAL_COMMUNITY)
Admission: RE | Admit: 2020-03-14 | Discharge: 2020-03-14 | Disposition: A | Discharge status: Home / Self Care | Payer: PPO | Source: Ambulatory Visit | Attending: Cardiology | Admitting: Cardiology

## 2020-03-14 ENCOUNTER — Other Ambulatory Visit: Payer: Self-pay

## 2020-03-14 ENCOUNTER — Encounter (HOSPITAL_COMMUNITY): Payer: PPO

## 2020-03-14 DIAGNOSIS — J479 Bronchiectasis, uncomplicated: Secondary | ICD-10-CM

## 2020-03-14 NOTE — Progress Notes (Signed)
Daily Session Note  Patient Details  Name: [name] MRN: [id number] Date of Birth: 03/15/1956 Referring Provider:   Flowsheet Row Pulmonary Rehab Walk Test from 01/08/2020 in [facility]  Referring Provider Dr. Daniels      Encounter Date: 03/14/2020  Check In:  Session Check In - 03/14/20 1103      Check-In   Supervising physician immediately available to respond to emergencies [facility] immediately available    Physician(s) Dr. Gherghe    Location [facility]    Staff Present Joan Behrens, RN, BSN;Lisa Hughes, RN;Jessica Martin, MS, ACSM-CEP, Exercise Physiologist    Virtual Visit No    Medication changes reported     No    Fall or balance concerns reported    No    Tobacco Cessation No Change    Warm-up and Cool-down Performed on first and last piece of equipment    Resistance Training Performed Yes    VAD Patient? No    PAD/SET Patient? No      Pain Assessment   Currently in Pain? No/denies    Multiple Pain Sites No           Capillary Blood Glucose: No results found for this or any previous visit (from the past 24 hour(s)).    Social History   Tobacco Use  Smoking Status Never Smoker  Smokeless Tobacco Never Used    Goals Met:  Exercise tolerated well No report of cardiac concerns or symptoms Strength training completed today  Goals Unmet:  Not Applicable  Comments: Service time is from 1050 to 1200    Dr. Traci Turner is Medical Director for Cardiac Rehab at [facility]. 

## 2020-03-18 NOTE — Unmapped (Signed)
Jordan [name] 's Colistin shipment will be delayed as a result of a different copay.      I have reached out to the patient and left a voicemail message.  We will wait for a call back from the patient to reschedule the delivery.  We have not confirmed the new delivery date.

## 2020-03-19 ENCOUNTER — Encounter (HOSPITAL_COMMUNITY)
Admission: RE | Admit: 2020-03-19 | Discharge: 2020-03-19 | Disposition: A | Discharge status: Home / Self Care | Payer: PPO | Source: Ambulatory Visit | Attending: Cardiology | Admitting: Cardiology

## 2020-03-19 ENCOUNTER — Other Ambulatory Visit: Payer: Self-pay

## 2020-03-19 ENCOUNTER — Encounter (HOSPITAL_COMMUNITY): Payer: PPO

## 2020-03-19 VITALS — Wt 180.3 lb

## 2020-03-19 DIAGNOSIS — J479 Bronchiectasis, uncomplicated: Secondary | ICD-10-CM | POA: Diagnosis not present

## 2020-03-21 ENCOUNTER — Encounter (HOSPITAL_COMMUNITY): Payer: PPO

## 2020-03-24 DIAGNOSIS — J479 Bronchiectasis, uncomplicated: Secondary | ICD-10-CM | POA: Diagnosis not present

## 2020-03-24 DIAGNOSIS — G4733 Obstructive sleep apnea (adult) (pediatric): Secondary | ICD-10-CM | POA: Diagnosis not present

## 2020-03-24 DIAGNOSIS — J471 Bronchiectasis with (acute) exacerbation: Secondary | ICD-10-CM | POA: Diagnosis not present

## 2020-03-25 ENCOUNTER — Telehealth (HOSPITAL_COMMUNITY): Payer: Self-pay | Admitting: *Deleted

## 2020-03-25 NOTE — Telephone Encounter (Signed)
[name] called to inform pulmonary staff that he had siginificant exposure with his son in law who has tested + for Covid. [name] is having some symptoms with fatigue and nasal congestion.  Pt went to community testing clinic at [facility] on yesterday ( his wife also was tested). Test results in 4-5 days.  Advised pt to hold on Pulmonary rehab until his test results were back.  Please give [place] a call and let us know his results.  Based upon result from testing and reported continued symptoms, he will be advised on when he may return. Verbalized understanding and is in agreement of this. Cherre Huger, BSN Cardiac and Training and development officer

## 2020-03-26 ENCOUNTER — Encounter (HOSPITAL_COMMUNITY): Payer: Medicare Other

## 2020-03-27 MED FILL — COLISTIN (COLISTIMETHATE SODIUM) 150 MG SOLUTION FOR INJECTION: 30 days supply | Qty: 60 | Fill #4

## 2020-03-27 MED FILL — LC PLUS MISC: 30 days supply | Qty: 1 | Fill #5

## 2020-03-27 MED FILL — LC PLUS MISC: 30 days supply | Qty: 1 | Fill #5 | Status: AC

## 2020-03-27 MED FILL — SODIUM CHLORIDE 10 % FOR NEBULIZATION: 30 days supply | Qty: 900 | Fill #3 | Status: AC

## 2020-03-27 MED FILL — SODIUM CHLORIDE 10 % FOR NEBULIZATION: RESPIRATORY_TRACT | 30 days supply | Qty: 900 | Fill #3

## 2020-03-27 MED FILL — BD LUER-LOK SYRINGE 3 ML 20 GAUGE X 1 1/2": 30 days supply | Qty: 60 | Fill #0 | Status: AC

## 2020-03-27 MED FILL — COLISTIN (COLISTIMETHATE SODIUM) 150 MG SOLUTION FOR INJECTION: 30 days supply | Qty: 60 | Fill #4 | Status: AC

## 2020-03-27 NOTE — Unmapped (Signed)
[facility] [employer] called back about the delivery for entire shipment and would like the delivery to ship out 03/27/2020 via UPS or [employer] to be delivered 03/28/2020 . We have confirmed the delivery via UPS delivery .

## 2020-03-28 ENCOUNTER — Encounter (HOSPITAL_COMMUNITY): Payer: Medicare Other

## 2020-03-28 DIAGNOSIS — Z1159 Encounter for screening for other viral diseases: Secondary | ICD-10-CM | POA: Diagnosis not present

## 2020-03-29 DIAGNOSIS — J479 Bronchiectasis, uncomplicated: Principal | ICD-10-CM

## 2020-03-29 MED ORDER — NEEDLE (DISP) 19 GAUGE X 1 1/2"
0 refills | 0 days
Start: 2020-03-29 — End: ?

## 2020-03-29 MED ORDER — SYRINGE WITH NEEDLE, SAFETY 3 ML 21 GAUGE X 1 1/2"
3 refills | 0 days | Status: CP
Start: 2020-03-29 — End: ?

## 2020-03-29 MED ORDER — SODIUM CHLORIDE 0.9 % (FLUSH) INJECTION SYRINGE
11 refills | 0.00000 days | Status: CP
Start: 2020-03-29 — End: ?
  Filled 2020-04-03: qty 180, 30d supply, fill #0

## 2020-03-29 MED ORDER — BD REGULAR BEVEL NEEDLES 21 GAUGE X 1 1/2"
3 refills | 0 days | Status: CP
Start: 2020-03-29 — End: ?

## 2020-03-29 NOTE — Unmapped (Signed)
[facility] Pharmacist Note      Received request for substitution for substitution for SWFI due to backorder.     1. Bronchiectasis without complication (CMS-HCC)    - sodium chloride (BD POSIFLUSH NORMAL SALINE 0.9) 0.9 % injection; Inject 2mL of 0.9%NaCl into colistin vial & gently mix. After withdrawing colistin dose, add an additional 1mL of 0.9%NaCl to neb cup with the colistin dose.  Dispense: 180 mL; Refill: 11    Pharmacy sent to:  [facility]    Electronically signed by:  Barbette Hair, PharmD, MPH, BCPS, CPP  Clinical Pharmacist Practitioner  [facility]  [facility]  [phone]

## 2020-04-02 ENCOUNTER — Encounter (HOSPITAL_COMMUNITY)
Admission: RE | Admit: 2020-04-02 | Discharge: 2020-04-02 | Disposition: A | Discharge status: Home / Self Care | Payer: Medicare Other | Source: Ambulatory Visit | Attending: Cardiology | Admitting: Cardiology

## 2020-04-02 ENCOUNTER — Telehealth (HOSPITAL_COMMUNITY): Payer: Self-pay | Admitting: *Deleted

## 2020-04-02 DIAGNOSIS — J471 Bronchiectasis with (acute) exacerbation: Secondary | ICD-10-CM | POA: Diagnosis not present

## 2020-04-02 DIAGNOSIS — J479 Bronchiectasis, uncomplicated: Secondary | ICD-10-CM | POA: Diagnosis not present

## 2020-04-02 DIAGNOSIS — G4733 Obstructive sleep apnea (adult) (pediatric): Secondary | ICD-10-CM | POA: Diagnosis not present

## 2020-04-02 NOTE — Progress Notes (Signed)
Pulmonary Individual Treatment Plan  Patient Details  Name: [name] MRN: [id number] Date of Birth: 1955/05/06 Referring Provider:   April [name] Pulmonary Rehab Walk Test from 01/08/2020 in [place]  Referring Provider Dr. Olena [name]      Initial Encounter Date:  Flowsheet Row Pulmonary Rehab Walk Test from 01/08/2020 in [place]  Date 01/08/20      Visit Diagnosis: Bronchiectasis without acute exacerbation ([place])  Patient's Home Medications on Admission:   Current Outpatient Medications:  .  acidophilus (RISAQUAD) CAPS capsule, Take 2 capsules by mouth daily., Disp: 60 capsule, Rfl: 0 .  albuterol (PROVENTIL HFA;VENTOLIN HFA) 108 (90 Base) MCG/ACT inhaler, Inhale 2 puffs into the lungs every 4 (four) hours as needed for wheezing or shortness of breath. , Disp: , Rfl:  .  albuterol (PROVENTIL) (2.5 MG/3ML) 0.083% nebulizer solution, Inhale 2.5 mg into the lungs 4 (four) times daily. , Disp: , Rfl:  .  ascorbic acid (VITAMIN C) 250 MG CHEW, Chew 1,000 mg by mouth daily. , Disp: , Rfl:  .  azithromycin (ZITHROMAX) 250 MG tablet, Take 1 tablet (250 mg total) by mouth daily., Disp: , Rfl: 0 .  budesonide-formoterol (SYMBICORT) 160-4.5 MCG/ACT inhaler, Inhale 2 puffs into the lungs 2 (two) times daily., Disp: , Rfl:  .  Cholecalciferol 25 MCG (1000 UT) tablet, Take 1,000 Units by mouth daily., Disp: , Rfl:  .  Colistimethate Sodium POWD, Take 2 mLs by nebulization 2 (two) times daily. Use only every other month. 142m injection., Disp: , Rfl:  .  dornase alpha (PULMOZYME) 1 MG/ML nebulizer solution, Take 2.5 mg by nebulization daily. Use at least 30-60 minutes before airway clearance or after airway clearance, Disp: , Rfl:  .  doxylamine, Sleep, (UNISOM) 25 MG tablet, Take 25 mg by mouth at bedtime., Disp: , Rfl:  .  ferrous sulfate 325 (65 FE) MG tablet, Take 325 mg by mouth 2 (two) times daily., Disp: , Rfl:  .   fluticasone (FLONASE) 50 MCG/ACT nasal spray, Place 2 sprays into both nostrils daily., Disp: , Rfl:  .  HYDROcodone-acetaminophen (NORCO) 7.5-325 MG tablet, Take 1 tablet by mouth every 6 (six) hours as needed for moderate pain. (Patient taking differently: Take 1 tablet by mouth 3 (three) times daily. ), Disp: 20 tablet, Rfl: 0 .  HYDROcodone-homatropine (HYCODAN) 5-1.5 MG/5ML syrup, Take 5 mLs by mouth every 6 (six) hours as needed for cough. (Patient taking differently: Take 5 mLs by mouth every 4 (four) hours as needed for cough. ), Disp: 240 mL, Rfl: 0 .  montelukast (SINGULAIR) 10 MG tablet, Take 10 mg by mouth at bedtime., Disp: , Rfl:  .  Multiple Vitamin (MULTIVITAMIN WITH MINERALS) TABS tablet, Take 1 tablet by mouth daily., Disp: , Rfl:  .  NON FORMULARY, Use Acapella twice daily for airway clearance. Dx. 494.0. Mucous clearing device., Disp: , Rfl:  .  omeprazole (PRILOSEC) 20 MG capsule, Take 20 mg by mouth 2 (two) times daily. , Disp: , Rfl:  .  Respiratory Therapy Supplies (FLUTTER) DEVI, Use several times daily as needed for congestion/thick mucus, Disp: , Rfl:  .  Sodium Chloride 10 % NEBU, Inhale 5 mLs into the lungs 2 (two) times daily., Disp: , Rfl:  .  tobramycin, PF, ([name]) 300 MG/5ML nebulizer solution, Take 300 mg by nebulization 2 (two) times daily. Use only every other month, Disp: , Rfl:  .  Water For Irrigation, Sterile (STERILE WATER FOR  IRRIGATION), 4 mLs daily. Mix 6m with colistimethate powder to use for nebulization. 28 days on and 28 days off., Disp: , Rfl:   Past Medical History: Past Medical History:  Diagnosis Date  . Bronchiectasis ([name]   . Bronchiolitis   . Dizziness and giddiness 07/26/2012   none recent  . DJD (degenerative joint disease)    back and neck  . Dyspnea    with walking long distances  . Gastroesophageal reflux disease   . History of kidney stones yrs ago   passed on own  . HTN (hypertension)   . OSA (obstructive sleep apnea)    none  since 30 lb weight loss  . Pneumonia last time 2017  . Pneumonia with lung abscess ([place]   . PONV (postoperative nausea and vomiting)    likes scopolamine patch, trouble turning neck to left  . [place] 12, [place]  . Tear of left rotator cuff    healed on own    Tobacco Use: Social History   Tobacco Use  Smoking Status Never Smoker  Smokeless Tobacco Never Used    Labs: Recent Review Flowsheet Data    Labs for ITP Cardiac and Pulmonary Rehab Latest Ref Rng & Units 12/03/2010 12/06/2010 12/06/2010 05/23/2018   PHART 7.350 - 7.450 7.421 7.377 - -   PCO2ART 35.0 - 45.0 mmHg 35.7 47.4(H) - -   HCO3 20.0 - 28.0 mmol/L 22.8 27.9(H) - 24.2   TCO2 0 - 100 mmol/L 23.'9 29 27 ' -   ACIDBASEDEF 0.0 - 2.0 mmol/L 1.0 - - 0.2   O2SAT % 98.0 96.0 - 77.9      Capillary Blood Glucose: No results found for: GLUCAP   Pulmonary Assessment Scores:  Pulmonary Assessment Scores    Row Name 01/08/20 1606         ADL UCSD   ADL Phase Entry     SOB Score total 39           CAT Score   CAT Score 31           mMRC Score   mMRC Score 3           UCSD: Self-administered rating of dyspnea associated with activities of daily living (ADLs) 6-point scale (0 = "not at all" to 5 = "maximal or unable to do because of breathlessness")  Scoring Scores range from 0 to 120.  Minimally important difference is 5 units  CAT: CAT can identify the health impairment of COPD patients and is better correlated with disease progression.  CAT has a scoring range of zero to 40. The CAT score is classified into four groups of low (less than 10), medium (10 - 20), high (21-30) and very high (31-40) based on the impact level of disease on health status. A CAT score over 10 suggests significant symptoms.  A worsening CAT score could be explained by an exacerbation, poor medication adherence, poor inhaler technique, or progression of COPD or comorbid conditions.  CAT MCID is 2 points  mMRC: mMRC  (Modified Medical Research Council) Dyspnea Scale is used to assess the degree of baseline functional disability in patients of respiratory disease due to dyspnea. No minimal important difference is established. A decrease in score of 1 point or greater is considered a positive change.   Pulmonary Function Assessment:   Exercise Target Goals: Exercise Program Goal: Individual exercise prescription set using results from initial 6 min walk test and THRR while considering  patient's activity barriers  and safety.   Exercise Prescription Goal: Initial exercise prescription builds to 30-45 minutes a day of aerobic activity, 2-3 days per week.  Home exercise guidelines will be given to patient during program as part of exercise prescription that the participant will acknowledge.  Activity Barriers & Risk Stratification:  Activity Barriers & Cardiac Risk Stratification - 01/08/20 0956      Activity Barriers & Cardiac Risk Stratification   Activity Barriers Back Problems;Shortness of Breath;Deconditioning;Muscular Weakness           6 Minute Walk:  6 Minute Walk    Row Name 01/08/20 1547         6 Minute Walk   Phase Initial     Distance 1280 feet     Walk Time 6 minutes     # of Rest Breaks 0     MPH 2.42     METS 2.96     RPE 13     Perceived Dyspnea  2     VO2 Peak 10.35     Symptoms No     Resting HR 70 bpm     Resting BP 118/62     Resting Oxygen Saturation  95 %     Exercise Oxygen Saturation  during 6 min walk 92 %     Max Ex. HR 98 bpm     Max Ex. BP 118/60     2 Minute Post BP 112/60           Interval HR   1 Minute HR 89     2 Minute HR 90     3 Minute HR 96     4 Minute HR 96     5 Minute HR 96     6 Minute HR 98     2 Minute Post HR 96     Interval Heart Rate? Yes           Interval Oxygen   Interval Oxygen? Yes     Baseline Oxygen Saturation % 95 %     1 Minute Oxygen Saturation % 97 %     1 Minute Liters of Oxygen 2 L     2 Minute Oxygen  Saturation % 97 %     2 Minute Liters of Oxygen 2 L     3 Minute Oxygen Saturation % 97 %     3 Minute Liters of Oxygen 2 L     4 Minute Oxygen Saturation % 98 %     4 Minute Liters of Oxygen 2 L     5 Minute Oxygen Saturation % 94 %     5 Minute Liters of Oxygen 2 L     6 Minute Oxygen Saturation % 92 %     6 Minute Liters of Oxygen 2 L     2 Minute Post Oxygen Saturation % 96 %     2 Minute Post Liters of Oxygen 2 L            Oxygen Initial Assessment:  Oxygen Initial Assessment - 01/08/20 1544      Home Oxygen   Home Oxygen Device Portable Concentrator;Home Concentrator    Sleep Oxygen Prescription Continuous    Liters per minute 2    Home Exercise Oxygen Prescription Continuous    Liters per minute 2    Home Resting Oxygen Prescription None    Compliance with Home Oxygen Use Yes      Initial 6 min Walk   Oxygen  Used Continuous    Liters per minute 2      Program Oxygen Prescription   Program Oxygen Prescription Continuous    Liters per minute 2      Intervention   Short Term Goals To learn and exhibit compliance with exercise, home and travel O2 prescription;To learn and understand importance of monitoring SPO2 with pulse oximeter and demonstrate accurate use of the pulse oximeter.;To learn and understand importance of maintaining oxygen saturations>88%;To learn and demonstrate proper pursed lip breathing techniques or other breathing techniques.;To learn and demonstrate proper use of respiratory medications    Long  Term Goals Exhibits compliance with exercise, home and travel O2 prescription;Verbalizes importance of monitoring SPO2 with pulse oximeter and return demonstration;Maintenance of O2 saturations>88%;Exhibits proper breathing techniques, such as pursed lip breathing or other method taught during program session;Compliance with respiratory medication;Demonstrates proper use of MDI's           Oxygen Re-Evaluation:  Oxygen Re-Evaluation    Row Name  01/18/20 0809 02/08/20 0807 03/05/20 0755 04/02/20 0811       Program Oxygen Prescription   Program Oxygen Prescription Continuous Continuous Continuous Continuous    Liters per minute '2 2 2 2         ' Home Oxygen   Home Oxygen Device Portable Concentrator;Home Concentrator Portable Concentrator;Home Concentrator Portable Concentrator;Home Concentrator Portable Concentrator;Home Concentrator    Sleep Oxygen Prescription Continuous Continuous Continuous Continuous    Liters per minute '2 2 2 2    ' Home Exercise Oxygen Prescription Continuous Continuous Continuous Continuous    Liters per minute '2 2 2 2    ' Home Resting Oxygen Prescription None None None None    Compliance with Home Oxygen Use Yes Yes Yes Yes         Goals/Expected Outcomes   Short Term Goals To learn and exhibit compliance with exercise, home and travel O2 prescription;To learn and understand importance of monitoring SPO2 with pulse oximeter and demonstrate accurate use of the pulse oximeter.;To learn and understand importance of maintaining oxygen saturations>88%;To learn and demonstrate proper pursed lip breathing techniques or other breathing techniques.;To learn and demonstrate proper use of respiratory medications To learn and exhibit compliance with exercise, home and travel O2 prescription;To learn and understand importance of monitoring SPO2 with pulse oximeter and demonstrate accurate use of the pulse oximeter.;To learn and understand importance of maintaining oxygen saturations>88%;To learn and demonstrate proper pursed lip breathing techniques or other breathing techniques.;To learn and demonstrate proper use of respiratory medications To learn and exhibit compliance with exercise, home and travel O2 prescription;To learn and understand importance of monitoring SPO2 with pulse oximeter and demonstrate accurate use of the pulse oximeter.;To learn and understand importance of maintaining oxygen saturations>88%;To learn and  demonstrate proper pursed lip breathing techniques or other breathing techniques.;To learn and demonstrate proper use of respiratory medications To learn and exhibit compliance with exercise, home and travel O2 prescription;To learn and understand importance of monitoring SPO2 with pulse oximeter and demonstrate accurate use of the pulse oximeter.;To learn and understand importance of maintaining oxygen saturations>88%;To learn and demonstrate proper pursed lip breathing techniques or other breathing techniques.;To learn and demonstrate proper use of respiratory medications    Long  Term Goals Exhibits compliance with exercise, home and travel O2 prescription;Verbalizes importance of monitoring SPO2 with pulse oximeter and return demonstration;Maintenance of O2 saturations>88%;Exhibits proper breathing techniques, such as pursed lip breathing or other method taught during program session;Compliance with respiratory medication;Demonstrates proper use of MDI's Exhibits compliance with  exercise, home and travel O2 prescription;Verbalizes importance of monitoring SPO2 with pulse oximeter and return demonstration;Maintenance of O2 saturations>88%;Exhibits proper breathing techniques, such as pursed lip breathing or other method taught during program session;Compliance with respiratory medication;Demonstrates proper use of MDI's Exhibits compliance with exercise, home and travel O2 prescription;Verbalizes importance of monitoring SPO2 with pulse oximeter and return demonstration;Maintenance of O2 saturations>88%;Exhibits proper breathing techniques, such as pursed lip breathing or other method taught during program session;Compliance with respiratory medication;Demonstrates proper use of MDI's Exhibits compliance with exercise, home and travel O2 prescription;Verbalizes importance of monitoring SPO2 with pulse oximeter and return demonstration;Maintenance of O2 saturations>88%;Exhibits proper breathing techniques,  such as pursed lip breathing or other method taught during program session;Compliance with respiratory medication;Demonstrates proper use of MDI's    Goals/Expected Outcomes compliance and understanding of oxygen saturation and pursed lip breathing compliance and understanding of oxygen saturation and pursed lip breathing compliance and understanding of oxygen saturation and pursed lip breathing compliance and understanding of oxygen saturation and pursed lip breathing           Oxygen Discharge (Final Oxygen Re-Evaluation):  Oxygen Re-Evaluation - 04/02/20 0811      Program Oxygen Prescription   Program Oxygen Prescription Continuous    Liters per minute 2      Home Oxygen   Home Oxygen Device Portable Concentrator;Home Concentrator    Sleep Oxygen Prescription Continuous    Liters per minute 2    Home Exercise Oxygen Prescription Continuous    Liters per minute 2    Home Resting Oxygen Prescription None    Compliance with Home Oxygen Use Yes      Goals/Expected Outcomes   Short Term Goals To learn and exhibit compliance with exercise, home and travel O2 prescription;To learn and understand importance of monitoring SPO2 with pulse oximeter and demonstrate accurate use of the pulse oximeter.;To learn and understand importance of maintaining oxygen saturations>88%;To learn and demonstrate proper pursed lip breathing techniques or other breathing techniques.;To learn and demonstrate proper use of respiratory medications    Long  Term Goals Exhibits compliance with exercise, home and travel O2 prescription;Verbalizes importance of monitoring SPO2 with pulse oximeter and return demonstration;Maintenance of O2 saturations>88%;Exhibits proper breathing techniques, such as pursed lip breathing or other method taught during program session;Compliance with respiratory medication;Demonstrates proper use of MDI's    Goals/Expected Outcomes compliance and understanding of oxygen saturation and pursed  lip breathing           Initial Exercise Prescription:  Initial Exercise Prescription - 01/11/20 0800      Date of Initial Exercise RX and Referring Provider   Date 01/08/20    Referring Provider Dr. Olena [name]    Expected Discharge Date 03/14/20      Oxygen   Oxygen Continuous    Liters 2      NuStep   Level 1    Minutes 15      Arm Ergometer   Level 1    Minutes 15      Prescription Details   Frequency (times per week) 2    Duration Progress to 30 minutes of continuous aerobic without signs/symptoms of physical distress      Intensity   THRR 40-80% of Max Heartrate 62-124    Ratings of Perceived Exertion 11-13    Perceived Dyspnea 0-4      Progression   Progression Continue to progress workloads to maintain intensity without signs/symptoms of physical distress.      Resistance Training  Training Prescription Yes    Weight orange bands    Reps 10-15           Perform Capillary Blood Glucose checks as needed.  Exercise Prescription Changes:  Exercise Prescription Changes    Row Name 01/18/20 0800 01/23/20 1500 02/06/20 1500 02/20/20 1500 03/05/20 1500     Response to Exercise   Blood Pressure (Admit) 116/66 114/60 124/74 118/72 120/66   Blood Pressure (Exercise) 122/60 100/54 130/70 140/72 138/70   Blood Pressure (Exit) 108/70 110/64 116/70 120/60 116/62   Heart Rate (Admit) 106 bpm 105 bpm 94 bpm 110 bpm 95 bpm   Heart Rate (Exercise) 105 bpm 106 bpm 99 bpm 115 bpm 105 bpm   Heart Rate (Exit) 99 bpm 103 bpm 98 bpm 113 bpm 94 bpm   Oxygen Saturation (Admit) 90 % 95 % 93 % 95 % 93 %   Oxygen Saturation (Exercise) 92 % 93 % 93 % 94 % 92 %   Oxygen Saturation (Exit) 94 % 95 % 94 % 94 % 94 %   Rating of Perceived Exertion (Exercise) '13 14 13 15 14   ' Perceived Dyspnea (Exercise) '3 3 2 3 3   ' Duration Continue with 30 min of aerobic exercise without signs/symptoms of physical distress. Continue with 30 min of aerobic exercise without signs/symptoms of physical  distress. Continue with 30 min of aerobic exercise without signs/symptoms of physical distress. Continue with 30 min of aerobic exercise without signs/symptoms of physical distress. Progress to 30 minutes of  aerobic without signs/symptoms of physical distress   Intensity Other (comment)  40%-80% HR Max THRR unchanged THRR unchanged THRR unchanged THRR unchanged     Progression   Progression Continue to progress workloads to maintain intensity without signs/symptoms of physical distress. Continue to progress workloads to maintain intensity without signs/symptoms of physical distress. Continue to progress workloads to maintain intensity without signs/symptoms of physical distress. Continue to progress workloads to maintain intensity without signs/symptoms of physical distress. Continue to progress workloads to maintain intensity without signs/symptoms of physical distress.   Average METs 2 - - - -     Resistance Training   Training Prescription Yes Yes Yes Yes Yes   Weight Orange bands Orange bands Orange bands Orange bands Orange bands   Reps 10-15 10-15 10-15 10-15 10-15   Time 10 Minutes 10 Minutes 10 Minutes 10 Minutes 10 Minutes     Oxygen   Oxygen Continuous Continuous Continuous Continuous Continuous   Liters '2 2 2 2 2     ' NuStep   Level '2 2 3 3 5   ' SPM 80 80 80 80 80   Minutes '15 15 15 15 15   ' METs 2.1 1.9 2.1 2.1 2.7     Arm Ergometer   Level 1 1.5 2 2.5 2.5   RPM 46 42 49 51 51   Minutes '15 15 15 15 15   ' Row Name 03/19/20 1500             Response to Exercise   Blood Pressure (Admit) 130/80       Blood Pressure (Exercise) 136/70       Blood Pressure (Exit) 120/70       Heart Rate (Admit) 97 bpm       Heart Rate (Exercise) 110 bpm       Heart Rate (Exit) 100 bpm       Oxygen Saturation (Admit) 95 %       Oxygen Saturation (Exercise) 93 %  Oxygen Saturation (Exit) 94 %       Rating of Perceived Exertion (Exercise) 12       Perceived Dyspnea (Exercise) 2        Duration Continue with 30 min of aerobic exercise without signs/symptoms of physical distress.       Intensity THRR unchanged               Progression   Progression Continue to progress workloads to maintain intensity without signs/symptoms of physical distress.               Resistance Training   Training Prescription Yes       Weight orange bands       Reps 10-15       Time 10 Minutes               Oxygen   Oxygen Continuous       Liters 2               NuStep   Level 5       SPM 80       Minutes 15       METs 2.8               Arm Ergometer   Level 3       RPM 51       Minutes 15              Exercise Comments:  Exercise Comments    Row Name 01/16/20 0804           Exercise Comments Pt completed first day of exercise and tolerated well with no complaints              Exercise Goals and Review:  Exercise Goals    Row Name 01/08/20 0958 01/08/20 1553           Exercise Goals   Increase Physical Activity Yes Yes      Intervention Provide advice, education, support and counseling about physical activity/exercise needs.;Develop an individualized exercise prescription for aerobic and resistive training based on initial evaluation findings, risk stratification, comorbidities and participant's personal goals. Provide advice, education, support and counseling about physical activity/exercise needs.;Develop an individualized exercise prescription for aerobic and resistive training based on initial evaluation findings, risk stratification, comorbidities and participant's personal goals.      Expected Outcomes Short Term: Attend rehab on a regular basis to increase amount of physical activity.;Long Term: Add in home exercise to make exercise part of routine and to increase amount of physical activity.;Long Term: Exercising regularly at least 3-5 days a week. Short Term: Attend rehab on a regular basis to increase amount of physical activity.;Long Term: Add in home  exercise to make exercise part of routine and to increase amount of physical activity.;Long Term: Exercising regularly at least 3-5 days a week.      Increase Strength and Stamina Yes Yes      Intervention Provide advice, education, support and counseling about physical activity/exercise needs.;Develop an individualized exercise prescription for aerobic and resistive training based on initial evaluation findings, risk stratification, comorbidities and participant's personal goals. Provide advice, education, support and counseling about physical activity/exercise needs.;Develop an individualized exercise prescription for aerobic and resistive training based on initial evaluation findings, risk stratification, comorbidities and participant's personal goals.      Expected Outcomes Short Term: Increase workloads from initial exercise prescription for resistance, speed, and METs.;Short Term: Perform resistance training exercises routinely during  rehab and add in resistance training at home;Long Term: Improve cardiorespiratory fitness, muscular endurance and strength as measured by increased METs and functional capacity (6MWT) Short Term: Increase workloads from initial exercise prescription for resistance, speed, and METs.;Short Term: Perform resistance training exercises routinely during rehab and add in resistance training at home;Long Term: Improve cardiorespiratory fitness, muscular endurance and strength as measured by increased METs and functional capacity (6MWT)      Able to understand and use rate of perceived exertion (RPE) scale Yes Yes      Intervention Provide education and explanation on how to use RPE scale Provide education and explanation on how to use RPE scale      Expected Outcomes Short Term: Able to use RPE daily in rehab to express subjective intensity level;Long Term:  Able to use RPE to guide intensity level when exercising independently Short Term: Able to use RPE daily in rehab to express  subjective intensity level;Long Term:  Able to use RPE to guide intensity level when exercising independently      Able to understand and use Dyspnea scale Yes Yes      Intervention Provide education and explanation on how to use Dyspnea scale Provide education and explanation on how to use Dyspnea scale      Expected Outcomes Short Term: Able to use Dyspnea scale daily in rehab to express subjective sense of shortness of breath during exertion;Long Term: Able to use Dyspnea scale to guide intensity level when exercising independently Short Term: Able to use Dyspnea scale daily in rehab to express subjective sense of shortness of breath during exertion;Long Term: Able to use Dyspnea scale to guide intensity level when exercising independently      Knowledge and understanding of Target Heart Rate Range (THRR) Yes Yes      Intervention Provide education and explanation of THRR including how the numbers were predicted and where they are located for reference Provide education and explanation of THRR including how the numbers were predicted and where they are located for reference      Expected Outcomes Short Term: Able to state/look up THRR;Long Term: Able to use THRR to govern intensity when exercising independently;Short Term: Able to use daily as guideline for intensity in rehab Short Term: Able to state/look up THRR;Long Term: Able to use THRR to govern intensity when exercising independently;Short Term: Able to use daily as guideline for intensity in rehab      Understanding of Exercise Prescription Yes Yes      Intervention Provide education, explanation, and written materials on patient's individual exercise prescription Provide education, explanation, and written materials on patient's individual exercise prescription      Expected Outcomes Short Term: Able to explain program exercise prescription;Long Term: Able to explain home exercise prescription to exercise independently Short Term: Able to  explain program exercise prescription;Long Term: Able to explain home exercise prescription to exercise independently             Exercise Goals Re-Evaluation :  Exercise Goals Re-Evaluation    Row Name 01/18/20 0808 02/08/20 0804 03/05/20 0751 04/02/20 0812       Exercise Goal Re-Evaluation   Exercise Goals Review Increase Physical Activity;Increase Strength and Stamina;Able to understand and use rate of perceived exertion (RPE) scale;Able to understand and use Dyspnea scale;Knowledge and understanding of Target Heart Rate Range (THRR);Understanding of Exercise Prescription Increase Physical Activity;Increase Strength and Stamina;Able to understand and use rate of perceived exertion (RPE) scale;Able to understand and use Dyspnea scale;Knowledge and  understanding of Target Heart Rate Range (THRR);Understanding of Exercise Prescription Increase Physical Activity;Increase Strength and Stamina;Able to understand and use rate of perceived exertion (RPE) scale;Able to understand and use Dyspnea scale;Knowledge and understanding of Target Heart Rate Range (THRR);Understanding of Exercise Prescription Increase Physical Activity;Increase Strength and Stamina;Able to understand and use rate of perceived exertion (RPE) scale;Able to understand and use Dyspnea scale;Knowledge and understanding of Target Heart Rate Range (THRR);Understanding of Exercise Prescription    Comments Pt has completed 1 exercise session and tolerated well. He is exercising at 2.1 METS on the Nustep and 46 RPM's on the Arm Ergometer. Will continue to monitor and progress as able Pt has completed 7 exercise sessions and has been making steady progressions with workloads and METS. He is exercising at 2.1 METS on the Nustep and 49 WATTS on the Arm Ergometer. Will continue to monitor and progress as he is able. [name] has completed 12 exercise sessions and has been slow to make progressions as of late. He started out doing pretty well but his  METS have been averaging the same over the last few weeks. He has a lot of family stuff going on, but physically he has not had any complaints with exercise. He is making slow increases with workload levels on the Nustep and Arm Ergometer. He is exercising at 2.2 METS on the Nustep and 48 WATTS on the Arm ergometer. [name] has completed 16 exercise sessions and has been more consistent with making workload and MET increases. He is exercising at 2.8 METS on the Nustep and 53 WATTS on the arm ergometer.. He has been out of rehab for over a week due to being exposed to COVID-19 and awaiting COVID test results. Will continue to monitor and progress as he is able.    Expected Outcomes Through exercise at rehab and home the patient will decrease shortness of breath with daily activities and feel confident in carrying out an exercise regimn at home. Through exercise at rehab and home the patient will decrease shortness of breath with daily activities and feel confident in carrying out an exercise regimn at home. Through exercise at rehab and home the patient will decrease shortness of breath with daily activities and feel confident in carrying out an exercise regimn at home. Through exercise at rehab and home the patient will decrease shortness of breath with daily activities and feel confident in carrying out an exercise regimn at home.           Discharge Exercise Prescription (Final Exercise Prescription Changes):  Exercise Prescription Changes - 03/19/20 1500      Response to Exercise   Blood Pressure (Admit) 130/80    Blood Pressure (Exercise) 136/70    Blood Pressure (Exit) 120/70    Heart Rate (Admit) 97 bpm    Heart Rate (Exercise) 110 bpm    Heart Rate (Exit) 100 bpm    Oxygen Saturation (Admit) 95 %    Oxygen Saturation (Exercise) 93 %    Oxygen Saturation (Exit) 94 %    Rating of Perceived Exertion (Exercise) 12    Perceived Dyspnea (Exercise) 2    Duration Continue with 30 min of aerobic  exercise without signs/symptoms of physical distress.    Intensity THRR unchanged      Progression   Progression Continue to progress workloads to maintain intensity without signs/symptoms of physical distress.      Resistance Training   Training Prescription Yes    Weight orange bands    Reps 10-15  Time 10 Minutes      Oxygen   Oxygen Continuous    Liters 2      NuStep   Level 5    SPM 80    Minutes 15    METs 2.8      Arm Ergometer   Level 3    RPM 51    Minutes 15           Nutrition:  Target Goals: Understanding of nutrition guidelines, daily intake of sodium <1554m, cholesterol <208m calories 30% from fat and 7% or less from saturated fats, daily to have 5 or more servings of fruits and vegetables.  Biometrics:  Pre Biometrics - 01/08/20 1554      Pre Biometrics   Grip Strength 19 kg            Nutrition Therapy Plan and Nutrition Goals:  Nutrition Therapy & Goals - 01/30/20 1455      Nutrition Therapy   Diet Generally healthful      Personal Nutrition Goals   Nutrition Goal Eat more fruits for snacks    Personal Goal #2 Choose 3 whole grains daily    Personal Goal #3 Continue eating beans and veggies with dinner      Intervention Plan   Intervention Prescribe, educate and counsel regarding individualized specific dietary modifications aiming towards targeted core components such as weight, hypertension, lipid management, diabetes, heart failure and other comorbidities.    Expected Outcomes Short Term Goal: Understand basic principles of dietary content, such as calories, fat, sodium, cholesterol and nutrients.           Nutrition Assessments:  Nutrition Assessments - 01/17/20 1340      Rate Your Plate Scores   Pre Score 43          MEDIFICTS Score Key:  ?70 Need to make dietary changes   40-70 Heart Healthy Diet  ? 40 Therapeutic Level Cholesterol Diet   Picture Your Plate Scores:  <4<95nhealthy dietary pattern with much  room for improvement.  41-50 Dietary pattern unlikely to meet recommendations for good health and room for improvement.  51-60 More healthful dietary pattern, with some room for improvement.   >60 Healthy dietary pattern, although there may be some specific behaviors that could be improved.    Nutrition Goals Re-Evaluation:  Nutrition Goals Re-Evaluation    Ro[place]ame 01/30/20 1456 03/04/20 1345 04/01/20 0830         Goals   Current Weight 183 lb (83 kg) 183 lb (83 kg) 180 lb 5.4 oz (81.8 kg)     Nutrition Goal Eat more fruits for snacks Eat more fruits for snacks Eat more fruits for snacks           Personal Goal #2 Re-Evaluation   Personal Goal #2 Choose 3 whole grains daily Choose 3 whole grains daily Choose 3 whole grains daily           Personal Goal #3 Re-Evaluation   Personal Goal #3 Continue eating beans and veggies with dinner Continue eating beans and veggies with dinner Continue eating beans and veggies with dinner            Nutrition Goals Discharge (Final Nutrition Goals Re-Evaluation):  Nutrition Goals Re-Evaluation - 04/01/20 0830      Goals   Current Weight 180 lb 5.4 oz (81.8 kg)    Nutrition Goal Eat more fruits for snacks      Personal Goal #2 Re-Evaluation   Personal Goal #2  Choose 3 whole grains daily      Personal Goal #3 Re-Evaluation   Personal Goal #3 Continue eating beans and veggies with dinner           Psychosocial: Target Goals: Acknowledge presence or absence of significant depression and/or stress, maximize coping skills, provide positive support system. Participant is able to verbalize types and ability to use techniques and skills needed for reducing stress and depression.  Initial Review & Psychosocial Screening:  Initial Psych Review & Screening - 01/08/20 1010      Initial Review   Current issues with None Identified      Family Dynamics   Good Support System? Yes      Barriers   Psychosocial barriers to participate in  program There are no identifiable barriers or psychosocial needs.;The patient should benefit from training in stress management and relaxation.      Screening Interventions   Interventions Encouraged to exercise;Provide feedback about the scores to participant           Quality of Life Scores:  Scores of 19 and below usually indicate a poorer quality of life in these areas.  A difference of  2-3 points is a clinically meaningful difference.  A difference of 2-3 points in the total score of the Quality of Life Index has been associated with significant improvement in overall quality of life, self-image, physical symptoms, and general health in studies assessing change in quality of life.  PHQ-9: Recent Review Flowsheet Data    Depression screen [facility] 2/9 01/08/2020 01/08/2020   Decreased Interest 0 0   Down, Depressed, Hopeless 0 0   PHQ - 2 Score 0 0   Altered sleeping 0 -   Tired, decreased energy 3 -   Change in appetite 0 -   Feeling bad or failure about yourself  0 -   Trouble concentrating 0 -   Moving slowly or fidgety/restless 0 -   Suicidal thoughts 0 -   PHQ-9 Score 3 -   Difficult doing work/chores Somewhat difficult -     Interpretation of Total Score  Total Score Depression Severity:  1-4 = Minimal depression, 5-9 = Mild depression, 10-14 = Moderate depression, 15-19 = Moderately severe depression, 20-27 = Severe depression   Psychosocial Evaluation and Intervention:  Psychosocial Evaluation - 01/17/20 1132      Psychosocial Evaluation & Interventions   Expected Outcomes [name] will continue to display hopeful and positive outlook on life with no psychosocial barriers.           Psychosocial Re-Evaluation:  Psychosocial Re-Evaluation    [name] Name 01/17/20 1129 01/17/20 1132 02/12/20 1342 02/29/20 1121 04/01/20 1407     Psychosocial Re-Evaluation   Current issues with None Identified - None Identified None Identified None Identified   Comments [name] has strong  faith foundations with supportive church family. - No psychosocial concerns identified at this time. No psychosocial concerns identified at this time. No concerns identified.   Expected Outcomes - [name] will continue to display hopeful and positive outlook on life with no psychosocial barriers. For [name] to be free of psychosocial concerns while participating in pulmonary rehab. For [name] to continue to be free of psychosocial concerns while participating in pulmonary rehab. Continue to have no psychosocial concerns while in pulmonary rehab.   Interventions - Encouraged to attend Pulmonary Rehabilitation for the exercise - - Encouraged to attend Pulmonary Rehabilitation for the exercise   Continue Psychosocial Services  - No Follow up required No  Follow up required No Follow up required No Follow up required          Psychosocial Discharge (Final Psychosocial Re-Evaluation):  Psychosocial Re-Evaluation - 04/01/20 1407      Psychosocial Re-Evaluation   Current issues with None Identified    Comments No concerns identified.    Expected Outcomes Continue to have no psychosocial concerns while in pulmonary rehab.    Interventions Encouraged to attend Pulmonary Rehabilitation for the exercise    Continue Psychosocial Services  No Follow up required           Education: Education Goals: Education classes will be provided on a weekly basis, covering required topics. Participant will state understanding/return demonstration of topics presented.  Learning Barriers/Preferences:  Learning Barriers/Preferences - 01/08/20 1011      Learning Barriers/Preferences   Learning Preferences Group Instruction;Computer/Internet;Individual Instruction;Verbal Instruction;Video;Written Material           Education Topics: Risk Factor Reduction:  -Group instruction that is supported by a PowerPoint presentation. Instructor discusses the definition of a risk factor, different risk factors for pulmonary  disease, and how the heart and lungs work together.   Flowsheet Row PULMONARY REHAB OTHER RESPIRATORY from 02/29/2020 in [place]  Date 01/18/20  Educator Handout  Instruction Review Code 1- Verbalizes Understanding      Nutrition for Pulmonary Patient:  -Group instruction provided by PowerPoint slides, verbal discussion, and written materials to support subject matter. The instructor gives an explanation and review of healthy diet recommendations, which includes a discussion on weight management, recommendations for fruit and vegetable consumption, as well as protein, fluid, caffeine, fiber, sodium, sugar, and alcohol. Tips for eating when patients are short of breath are discussed.   Pursed Lip Breathing:  -Group instruction that is supported by demonstration and informational handouts. Instructor discusses the benefits of pursed lip and diaphragmatic breathing and detailed demonstration on how to preform both.     Oxygen Safety:  -Group instruction provided by PowerPoint, verbal discussion, and written material to support subject matter. There is an overview of "What is Oxygen" and "Why do we need it".  Instructor also reviews how to create a safe environment for oxygen use, the importance of using oxygen as prescribed, and the risks of noncompliance. There is a brief discussion on traveling with oxygen and resources the patient may utilize.   Oxygen Equipment:  -Group instruction provided by [facility] Staff utilizing handouts, written materials, and equipment demonstrations.   Signs and Symptoms:  -Group instruction provided by written material and verbal discussion to support subject matter. Warning signs and symptoms of infection, stroke, and heart attack are reviewed and when to call the physician/911 reinforced. Tips for preventing the spread of infection discussed.   Advanced Directives:  -Group instruction provided by verbal instruction and  written material to support subject matter. Instructor reviews Advanced Directive laws and proper instruction for filling out document.   Pulmonary Video:  -Group video education that reviews the importance of medication and oxygen compliance, exercise, good nutrition, pulmonary hygiene, and pursed lip and diaphragmatic breathing for the pulmonary patient.   Exercise for the Pulmonary Patient:  -Group instruction that is supported by a PowerPoint presentation. Instructor discusses benefits of exercise, core components of exercise, frequency, duration, and intensity of an exercise routine, importance of utilizing pulse oximetry during exercise, safety while exercising, and options of places to exercise outside of rehab.   Flowsheet Row PULMONARY REHAB OTHER RESPIRATORY from 02/29/2020 in [place]  [place]  Date 01/25/20  Educator handout      Pulmonary Medications:  -Verbally interactive group education provided by instructor with focus on inhaled medications and proper administration. Flowsheet Row PULMONARY REHAB OTHER RESPIRATORY from 02/29/2020 in [place]  Date 02/29/20  Educator Handout      Anatomy and Physiology of the Respiratory System and Intimacy:  -Group instruction provided by PowerPoint, verbal discussion, and written material to support subject matter. Instructor reviews respiratory cycle and anatomical components of the respiratory system and their functions. Instructor also reviews differences in obstructive and restrictive respiratory diseases with examples of each. Intimacy, Sex, and Sexuality differences are reviewed with a discussion on how relationships can change when diagnosed with pulmonary disease. Common sexual concerns are reviewed.   MD DAY -A group question and answer session with a medical doctor that allows participants to ask questions that relate to their pulmonary disease state.   OTHER  EDUCATION -Group or individual verbal, written, or video instructions that support the educational goals of the pulmonary rehab program. [place] from 02/29/2020 in [place]  Date 02/01/20  Educator handout  [Beat a Sedentary Lifestyls]      Holiday Eating Survival Tips:  -Group instruction provided by PowerPoint slides, verbal discussion, and written materials to support subject matter. The instructor gives patients tips, tricks, and techniques to help them not only survive but enjoy the holidays despite the onslaught of food that accompanies the holidays.   Knowledge Questionnaire Score:  Knowledge Questionnaire Score - 01/08/20 1559      Knowledge Questionnaire Score   Pre Score 17/18           Core Components/Risk Factors/Patient Goals at Admission:  Personal Goals and Risk Factors at Admission - 01/08/20 1014      Core Components/Risk Factors/Patient Goals on Admission    Weight Management Weight Maintenance    Improve shortness of breath with ADL's Yes    Intervention Provide education, individualized exercise plan and daily activity instruction to help decrease symptoms of SOB with activities of daily living.    Expected Outcomes Short Term: Improve cardiorespiratory fitness to achieve a reduction of symptoms when performing ADLs;Long Term: Be able to perform more ADLs without symptoms or delay the onset of symptoms           Core Components/Risk Factors/Patient Goals Review:   Goals and Risk Factor Review    Row Name 01/17/20 1132 02/12/20 1344 02/29/20 1124 04/01/20 1407       Core Components/Risk Factors/Patient Goals Review   Personal Goals Review Weight Management/Obesity;Develop more efficient breathing techniques such as purse lipped breathing and diaphragmatic breathing and practicing self-pacing with activity.;Improve shortness of breath with ADL's;Increase knowledge of respiratory  medications and ability to use respiratory devices properly. Weight Management/Obesity;Increase knowledge of respiratory medications and ability to use respiratory devices properly.;Improve shortness of breath with ADL's;Develop more efficient breathing techniques such as purse lipped breathing and diaphragmatic breathing and practicing self-pacing with activity. Improve shortness of breath with ADL's;Increase knowledge of respiratory medications and ability to use respiratory devices properly.;Develop more efficient breathing techniques such as purse lipped breathing and diaphragmatic breathing and practicing self-pacing with activity. Develop more efficient breathing techniques such as purse lipped breathing and diaphragmatic breathing and practicing self-pacing with activity.;Increase knowledge of respiratory medications and ability to use respiratory devices properly.;Improve shortness of breath with ADL's    Review [name] completed 1 exercise  session.  Not enough information at this point to assess progress.  Anticipate that [name] who is committed to getting healthy and strong so that he will not need lung transplant will make progress toward admission goals. [name] cannot see a major improvement in his strength, stamina, or shortness of breath, but he is not exercising outside of pulmonary rehab. Is getting stronger, is to be evaluated @ [facility] for a possible lung transplant.  Exercising @ level 4 on nustep and level 2.5 on the arm ergomenter. [name] was exposed to a family member with COVID, he has symptoms of congestion, awaiting test results to see if he can return to pulmonary rehab.    Expected Outcomes See Admission Goals That [name] will see an improvement in his strength, stamina, and shortness of breath in the next 30 days and that he will exercise at home 2-3 days/week in addition to exercising in pulmonary rehab. For [name] to continue to progress and become stronger and exercise at home after graduation  from program. For [name] to graduate 04/12/2019 and continue exercising on his own after graduation.           Core Components/Risk Factors/Patient Goals at Discharge (Final Review):   Goals and Risk Factor Review - 04/01/20 1407      Core Components/Risk Factors/Patient Goals Review   Personal Goals Review Develop more efficient breathing techniques such as purse lipped breathing and diaphragmatic breathing and practicing self-pacing with activity.;Increase knowledge of respiratory medications and ability to use respiratory devices properly.;Improve shortness of breath with ADL's    Review [name] was exposed to a family member with COVID, he has symptoms of congestion, awaiting test results to see if he can return to pulmonary rehab.    Expected Outcomes For [name] to graduate 04/12/2019 and continue exercising on his own after graduation.           ITP Comments:   Comments: ITP REVIEW Pt is making expected progress toward pulmonary rehab goals after completing 16 sessions. Recommend continued exercise, life style modification, education, and utilization of breathing techniques to increase stamina and strength and decrease shortness of breath with exertion.

## 2020-04-03 MED FILL — BD REGULAR BEVEL NEEDLES 21 GAUGE X 1 1/2": 30 days supply | Qty: 60 | Fill #0

## 2020-04-04 ENCOUNTER — Encounter (HOSPITAL_COMMUNITY): Payer: Medicare Other

## 2020-04-09 ENCOUNTER — Encounter (HOSPITAL_COMMUNITY): Admission: RE | Admit: 2020-04-09 | Payer: Medicare Other | Source: Ambulatory Visit

## 2020-04-09 DIAGNOSIS — J479 Bronchiectasis, uncomplicated: Secondary | ICD-10-CM | POA: Diagnosis not present

## 2020-04-11 ENCOUNTER — Encounter (HOSPITAL_COMMUNITY): Payer: Medicare Other

## 2020-04-11 NOTE — Unmapped (Signed)
[facility] Refill Coordination Note    Specialty Medication(s) to be Shipped:   CF/Pulmonary: -Generic inhaled tobramycin 300mg /57mL inhalation solution    Other medication(s) to be shipped: No additional medications requested for fill at this time     Jordan [name], DOB: January 28, 1956  Phone: [phone] (home)       All above HIPAA information was verified with patient.     Was a Nurse, learning disability used for this call? No    Completed refill call assessment today to schedule patient's medication shipment from the [facility] [phone]).       Specialty medication(s) and dose(s) confirmed: Regimen is correct and unchanged.   Changes to medications: Brodin reports no changes at this time.  Changes to insurance: No  Questions for the pharmacist: No    Confirmed patient received Welcome Packet with first shipment. The patient will receive a drug information handout for each medication shipped and additional FDA Medication Guides as required.       DISEASE/MEDICATION-SPECIFIC INFORMATION        N/A    SPECIALTY MEDICATION ADHERENCE     Medication Adherence    Patient reported X missed doses in the last month: 0  Specialty Medication: tobramycin 300mg /75ml  Patient is on additional specialty medications: No  Patient is on more than two specialty medications: No  Any gaps in refill history greater than 2 weeks in the last 3 months: no  Demonstrates understanding of importance of adherence: yes  Informant: patient  Reliability of informant: reliable  Provider-estimated medication adherence level: good  Patient is at risk for Non-Adherence: No  Support network for adherence: family member                tobramycin 300/5 mg/ml: 0 days of medicine on hand -next start date:04/24/20        SHIPPING     Shipping address confirmed in Epic.     Delivery Scheduled: Yes, Expected medication delivery date: 01/26.     Medication will be delivered via UPS to the prescription address in Epic WAM.    Antonietta Barcelona   [facility] Specialty Technician

## 2020-04-16 MED FILL — TOBRAMYCIN 300 MG/5 ML IN 0.225 % SODIUM CHLORIDE FOR NEBULIZATION: RESPIRATORY_TRACT | 56 days supply | Qty: 280 | Fill #1

## 2020-04-17 DIAGNOSIS — J479 Bronchiectasis, uncomplicated: Principal | ICD-10-CM

## 2020-04-17 MED ORDER — SODIUM CHLORIDE 10 % FOR NEBULIZATION
Freq: Two times a day (BID) | RESPIRATORY_TRACT | 3 refills | 90.00000 days | Status: CP
Start: 2020-04-17 — End: ?
  Filled 2020-05-13: qty 900, 90d supply, fill #0

## 2020-04-17 NOTE — Unmapped (Signed)
See Encounter from 04/11/2020 from [employer].

## 2020-04-17 NOTE — Unmapped (Signed)
[facility] Pharmacist Note      Received prescription renewal request for Jordan [name]     1. Bronchiectasis without complication (CMS-HCC)    - sodium chloride 10 % Nebu; Inhale 5 mL by nebulization Two (2) times a day.  Dispense: 900 mL; Refill: 3    Pharmacy sent to:  [facility]    Electronically signed by:  Barbette Hair, PharmD, MPH, BCPS, CPP  Clinical Pharmacist Practitioner  [facility]  [phone]

## 2020-04-23 MED ORDER — COENZYME Q10 300 MG CAPSULE
ORAL | 0 days
Start: 2020-04-23 — End: ?

## 2020-04-23 NOTE — Progress Notes (Signed)
Discharge Progress Report  Patient Details  Name: [name] MRN: [id number] Date of Birth: [birth year] Referring Provider:   [facility] Walk Test from 01/08/2020 in [place]  Referring Provider Dr. Olena Heckle       Number of Visits: 16  Reason for Discharge:  Patient reached a stable level of exercise. Patient independent in their exercise. Patient has met program and personal goals.  Smoking History:  Social History   Tobacco Use  Smoking Status Never Smoker  Smokeless Tobacco Never Used    Diagnosis:  Bronchiectasis without acute exacerbation ([place])  ADL [facility]:  Pulmonary Assessment Scores    Row Name 01/08/20 1606         ADL [facility]   ADL Phase Entry     SOB Score total 39           CAT Score   CAT Score 31           mMRC Score   mMRC Score 3            Initial Exercise Prescription:  Initial Exercise Prescription - 01/11/20 0800      Date of Initial Exercise RX and Referring Provider   Date 01/08/20    Referring Provider Dr. Olena Heckle    Expected Discharge Date 03/14/20      Oxygen   Oxygen Continuous    Liters 2      NuStep   Level 1    Minutes 15      Arm Ergometer   Level 1    Minutes 15      Prescription Details   Frequency (times per week) 2    Duration Progress to 30 minutes of continuous aerobic without signs/symptoms of physical distress      Intensity   THRR 40-80% of Max Heartrate 62-124    Ratings of Perceived Exertion 11-13    Perceived Dyspnea 0-4      Progression   Progression Continue to progress workloads to maintain intensity without signs/symptoms of physical distress.      Resistance Training   Training Prescription Yes    Weight orange bands    Reps 10-15           Discharge Exercise Prescription (Final Exercise Prescription Changes):  Exercise Prescription Changes - 03/19/20 1500      Response to Exercise   Blood Pressure (Admit) 130/80    Blood  Pressure (Exercise) 136/70    Blood Pressure (Exit) 120/70    Heart Rate (Admit) 97 bpm    Heart Rate (Exercise) 110 bpm    Heart Rate (Exit) 100 bpm    Oxygen Saturation (Admit) 95 %    Oxygen Saturation (Exercise) 93 %    Oxygen Saturation (Exit) 94 %    Rating of Perceived Exertion (Exercise) 12    Perceived Dyspnea (Exercise) 2    Duration Continue with 30 min of aerobic exercise without signs/symptoms of physical distress.    Intensity THRR unchanged      Progression   Progression Continue to progress workloads to maintain intensity without signs/symptoms of physical distress.      Resistance Training   Training Prescription Yes    Weight orange bands    Reps 10-15    Time 10 Minutes      Oxygen   Oxygen Continuous    Liters 2      NuStep   Level 5    SPM 80    Minutes 15  METs 2.8      Arm Ergometer   Level 3    RPM 51    Minutes 15           Functional Capacity:  6 Minute Walk    Row Name 01/08/20 1547         6 Minute Walk   Phase Initial     Distance 1280 feet     Walk Time 6 minutes     # of Rest Breaks 0     MPH 2.42     METS 2.96     RPE 13     Perceived Dyspnea  2     VO2 Peak 10.35     Symptoms No     Resting HR 70 bpm     Resting BP 118/62     Resting Oxygen Saturation  95 %     Exercise Oxygen Saturation  during 6 min walk 92 %     Max Ex. HR 98 bpm     Max Ex. BP 118/60     2 Minute Post BP 112/60           Interval HR   1 Minute HR 89     2 Minute HR 90     3 Minute HR 96     4 Minute HR 96     5 Minute HR 96     6 Minute HR 98     2 Minute Post HR 96     Interval Heart Rate? Yes           Interval Oxygen   Interval Oxygen? Yes     Baseline Oxygen Saturation % 95 %     1 Minute Oxygen Saturation % 97 %     1 Minute Liters of Oxygen 2 L     2 Minute Oxygen Saturation % 97 %     2 Minute Liters of Oxygen 2 L     3 Minute Oxygen Saturation % 97 %     3 Minute Liters of Oxygen 2 L     4 Minute Oxygen Saturation %  98 %     4 Minute Liters of Oxygen 2 L     5 Minute Oxygen Saturation % 94 %     5 Minute Liters of Oxygen 2 L     6 Minute Oxygen Saturation % 92 %     6 Minute Liters of Oxygen 2 L     2 Minute Post Oxygen Saturation % 96 %     2 Minute Post Liters of Oxygen 2 L            Psychological, QOL, Others - Outcomes: PHQ 2/9: Depression screen [facility] 2/9 01/08/2020 01/08/2020  Decreased Interest 0 0  Down, Depressed, Hopeless 0 0  PHQ - 2 Score 0 0  Altered sleeping 0 -  Tired, decreased energy 3 -  Change in appetite 0 -  Feeling bad or failure about yourself  0 -  Trouble concentrating 0 -  Moving slowly or fidgety/restless 0 -  Suicidal thoughts 0 -  PHQ-9 Score 3 -  Difficult doing work/chores Somewhat difficult -  Some encounter information is confidential and restricted. Go to Review Flowsheets activity to see all data.    Quality of Life:   Personal Goals: Goals established at orientation with interventions provided to work toward goal.  Personal Goals and Risk Factors at Admission - 01/08/20 1014  Core Components/Risk Factors/Patient Goals on Admission    Weight Management Weight Maintenance    Improve shortness of breath with ADL's Yes    Intervention Provide education, individualized exercise plan and daily activity instruction to help decrease symptoms of SOB with activities of daily living.    Expected Outcomes Short Term: Improve cardiorespiratory fitness to achieve a reduction of symptoms when performing ADLs;Long Term: Be able to perform more ADLs without symptoms or delay the onset of symptoms            Personal Goals Discharge:  Goals and Risk Factor Review    Row Name 01/17/20 1132 02/12/20 1344 02/29/20 1124 04/01/20 1407       Core Components/Risk Factors/Patient Goals Review   Personal Goals Review Weight Management/Obesity;Develop more efficient breathing techniques such as purse lipped breathing and diaphragmatic breathing and practicing  self-pacing with activity.;Improve shortness of breath with ADL's;Increase knowledge of respiratory medications and ability to use respiratory devices properly. Weight Management/Obesity;Increase knowledge of respiratory medications and ability to use respiratory devices properly.;Improve shortness of breath with ADL's;Develop more efficient breathing techniques such as purse lipped breathing and diaphragmatic breathing and practicing self-pacing with activity. Improve shortness of breath with ADL's;Increase knowledge of respiratory medications and ability to use respiratory devices properly.;Develop more efficient breathing techniques such as purse lipped breathing and diaphragmatic breathing and practicing self-pacing with activity. Develop more efficient breathing techniques such as purse lipped breathing and diaphragmatic breathing and practicing self-pacing with activity.;Increase knowledge of respiratory medications and ability to use respiratory devices properly.;Improve shortness of breath with ADL's    Review [place] completed 1 exercise session.  Not enough information at this point to assess progress.  Anticipate that Selvin who is committed to getting healthy and strong so that he will not need lung transplant will make progress toward admission goals. Gottfried cannot see a major improvement in his strength, stamina, or shortness of breath, but he is not exercising outside of pulmonary rehab. Is getting stronger, is to be evaluated @ [facility] for a possible lung transplant.  Exercising @ level 4 on nustep and level 2.5 on the arm ergomenter. Rayhan was exposed to a family member with COVID, he has symptoms of congestion, awaiting test results to see if he can return to pulmonary rehab.    Expected Outcomes See Admission Goals That Maico will see an improvement in his strength, stamina, and shortness of breath in the next 30 days and that he will exercise at home 2-3 days/week in addition to exercising in  pulmonary rehab. For Ankush to continue to progress and become stronger and exercise at home after graduation from program. For Trent to graduate 04/12/2019 and continue exercising on his own after graduation.           Exercise Goals and Review:  Exercise Goals    Row Name 01/08/20 0958 01/08/20 1553           Exercise Goals   Increase Physical Activity Yes Yes      Intervention Provide advice, education, support and counseling about physical activity/exercise needs.;Develop an individualized exercise prescription for aerobic and resistive training based on initial evaluation findings, risk stratification, comorbidities and participant's personal goals. Provide advice, education, support and counseling about physical activity/exercise needs.;Develop an individualized exercise prescription for aerobic and resistive training based on initial evaluation findings, risk stratification, comorbidities and participant's personal goals.      Expected Outcomes Short Term: Attend rehab on a regular basis to increase amount of physical activity.;Long Term:  Add in home exercise to make exercise part of routine and to increase amount of physical activity.;Long Term: Exercising regularly at least 3-5 days a week. Short Term: Attend rehab on a regular basis to increase amount of physical activity.;Long Term: Add in home exercise to make exercise part of routine and to increase amount of physical activity.;Long Term: Exercising regularly at least 3-5 days a week.      Increase Strength and Stamina Yes Yes      Intervention Provide advice, education, support and counseling about physical activity/exercise needs.;Develop an individualized exercise prescription for aerobic and resistive training based on initial evaluation findings, risk stratification, comorbidities and participant's personal goals. Provide advice, education, support and counseling about physical activity/exercise needs.;Develop an individualized  exercise prescription for aerobic and resistive training based on initial evaluation findings, risk stratification, comorbidities and participant's personal goals.      Expected Outcomes Short Term: Increase workloads from initial exercise prescription for resistance, speed, and METs.;Short Term: Perform resistance training exercises routinely during rehab and add in resistance training at home;Long Term: Improve cardiorespiratory fitness, muscular endurance and strength as measured by increased METs and functional capacity (6MWT) Short Term: Increase workloads from initial exercise prescription for resistance, speed, and METs.;Short Term: Perform resistance training exercises routinely during rehab and add in resistance training at home;Long Term: Improve cardiorespiratory fitness, muscular endurance and strength as measured by increased METs and functional capacity (6MWT)      Able to understand and use rate of perceived exertion (RPE) scale Yes Yes      Intervention Provide education and explanation on how to use RPE scale Provide education and explanation on how to use RPE scale      Expected Outcomes Short Term: Able to use RPE daily in rehab to express subjective intensity level;Long Term:  Able to use RPE to guide intensity level when exercising independently Short Term: Able to use RPE daily in rehab to express subjective intensity level;Long Term:  Able to use RPE to guide intensity level when exercising independently      Able to understand and use Dyspnea scale Yes Yes      Intervention Provide education and explanation on how to use Dyspnea scale Provide education and explanation on how to use Dyspnea scale      Expected Outcomes Short Term: Able to use Dyspnea scale daily in rehab to express subjective sense of shortness of breath during exertion;Long Term: Able to use Dyspnea scale to guide intensity level when exercising independently Short Term: Able to use Dyspnea scale daily in rehab to  express subjective sense of shortness of breath during exertion;Long Term: Able to use Dyspnea scale to guide intensity level when exercising independently      Knowledge and understanding of Target Heart Rate Range (THRR) Yes Yes      Intervention Provide education and explanation of THRR including how the numbers were predicted and where they are located for reference Provide education and explanation of THRR including how the numbers were predicted and where they are located for reference      Expected Outcomes Short Term: Able to state/look up THRR;Long Term: Able to use THRR to govern intensity when exercising independently;Short Term: Able to use daily as guideline for intensity in rehab Short Term: Able to state/look up THRR;Long Term: Able to use THRR to govern intensity when exercising independently;Short Term: Able to use daily as guideline for intensity in rehab      Understanding of Exercise Prescription Yes Yes  Intervention Provide education, explanation, and written materials on patient's individual exercise prescription Provide education, explanation, and written materials on patient's individual exercise prescription      Expected Outcomes Short Term: Able to explain program exercise prescription;Long Term: Able to explain home exercise prescription to exercise independently Short Term: Able to explain program exercise prescription;Long Term: Able to explain home exercise prescription to exercise independently             Exercise Goals Re-Evaluation:  Exercise Goals Re-Evaluation    Row Name 01/18/20 0808 02/08/20 0804 03/05/20 0751 04/02/20 0812       Exercise Goal Re-Evaluation   Exercise Goals Review Increase Physical Activity;Increase Strength and Stamina;Able to understand and use rate of perceived exertion (RPE) scale;Able to understand and use Dyspnea scale;Knowledge and understanding of Target Heart Rate Range (THRR);Understanding of Exercise Prescription Increase  Physical Activity;Increase Strength and Stamina;Able to understand and use rate of perceived exertion (RPE) scale;Able to understand and use Dyspnea scale;Knowledge and understanding of Target Heart Rate Range (THRR);Understanding of Exercise Prescription Increase Physical Activity;Increase Strength and Stamina;Able to understand and use rate of perceived exertion (RPE) scale;Able to understand and use Dyspnea scale;Knowledge and understanding of Target Heart Rate Range (THRR);Understanding of Exercise Prescription Increase Physical Activity;Increase Strength and Stamina;Able to understand and use rate of perceived exertion (RPE) scale;Able to understand and use Dyspnea scale;Knowledge and understanding of Target Heart Rate Range (THRR);Understanding of Exercise Prescription    Comments Pt has completed 1 exercise session and tolerated well. He is exercising at 2.1 METS on the Nustep and 46 RPM's on the Arm Ergometer. Will continue to monitor and progress as able Pt has completed 7 exercise sessions and has been making steady progressions with workloads and METS. He is exercising at 2.1 METS on the Nustep and 49 WATTS on the Arm Ergometer. Will continue to monitor and progress as he is able. Lonnie has completed 12 exercise sessions and has been slow to make progressions as of late. He started out doing pretty well but his METS have been averaging the same over the last few weeks. He has a lot of family stuff going on, but physically he has not had any complaints with exercise. He is making slow increases with workload levels on the Nustep and Arm Ergometer. He is exercising at 2.2 METS on the Nustep and 48 WATTS on the Arm ergometer. Connell has completed 16 exercise sessions and has been more consistent with making workload and MET increases. He is exercising at 2.8 METS on the Nustep and 53 WATTS on the arm ergometer.. He has been out of rehab for over a week due to being exposed to COVID-19 and awaiting COVID test  results. Will continue to monitor and progress as he is able.    Expected Outcomes Through exercise at rehab and home the patient will decrease shortness of breath with daily activities and feel confident in carrying out an exercise regimn at home. Through exercise at rehab and home the patient will decrease shortness of breath with daily activities and feel confident in carrying out an exercise regimn at home. Through exercise at rehab and home the patient will decrease shortness of breath with daily activities and feel confident in carrying out an exercise regimn at home. Through exercise at rehab and home the patient will decrease shortness of breath with daily activities and feel confident in carrying out an exercise regimn at home.           Nutrition & Weight -  Outcomes:  Pre Biometrics - 01/08/20 1554      Pre Biometrics   Grip Strength 19 kg            Nutrition:  Nutrition Therapy & Goals - 01/30/20 1455      Nutrition Therapy   Diet Generally healthful      Personal Nutrition Goals   Nutrition Goal Eat more fruits for snacks    Personal Goal #2 Choose 3 whole grains daily    Personal Goal #3 Continue eating beans and veggies with dinner      Intervention Plan   Intervention Prescribe, educate and counsel regarding individualized specific dietary modifications aiming towards targeted core components such as weight, hypertension, lipid management, diabetes, heart failure and other comorbidities.    Expected Outcomes Short Term Goal: Understand basic principles of dietary content, such as calories, fat, sodium, cholesterol and nutrients.           Nutrition Discharge:  Nutrition Assessments - 01/17/20 1340      Rate Your Plate Scores   Pre Score 43           Education Questionnaire Score:  Knowledge Questionnaire Score - 01/08/20 1559      Knowledge Questionnaire Score   Pre Score 17/18           Goals reviewed with patient; copy given to patient.

## 2020-04-23 NOTE — Addendum Note (Signed)
Encounter addended by: Lance Morin, RN on: 04/23/2020 3:43 PM  Actions taken: Clinical Note Signed, Episode resolved

## 2020-04-23 NOTE — Progress Notes (Signed)
Discharge Progress Report  Patient Details  Name: [name] MRN: [id number] Date of Birth: 07/22/1955 Referring Provider:   Flowsheet Row Pulmonary Rehab Walk Test from 01/08/2020 in [facility]  Referring Provider Dr. Daniels       Number of Visits: 16  Reason for Discharge:  Patient reached a stable level of exercise. Patient independent in their exercise. Patient has met program and personal goals.  Smoking History:  Social History   Tobacco Use  Smoking Status Never Smoker  Smokeless Tobacco Never Used    Diagnosis:  Bronchiectasis without acute exacerbation (HCC)  ADL [facility]:  Pulmonary Assessment Scores    Row Name 01/08/20 1606         ADL [facility]   ADL Phase Entry     SOB Score total 39           CAT Score   CAT Score 31           mMRC Score   mMRC Score 3            Initial Exercise Prescription:  Initial Exercise Prescription - 01/11/20 0800      Date of Initial Exercise RX and Referring Provider   Date 01/08/20    Referring Provider Dr. Daniels    Expected Discharge Date 03/14/20      Oxygen   Oxygen Continuous    Liters 2      NuStep   Level 1    Minutes 15      Arm Ergometer   Level 1    Minutes 15      Prescription Details   Frequency (times per week) 2    Duration Progress to 30 minutes of continuous aerobic without signs/symptoms of physical distress      Intensity   THRR 40-80% of Max Heartrate 62-124    Ratings of Perceived Exertion 11-13    Perceived Dyspnea 0-4      Progression   Progression Continue to progress workloads to maintain intensity without signs/symptoms of physical distress.      Resistance Training   Training Prescription Yes    Weight orange bands    Reps 10-15           Discharge Exercise Prescription (Final Exercise Prescription Changes):  Exercise Prescription Changes - 03/19/20 1500      Response to Exercise   Blood Pressure (Admit) 130/80    Blood  Pressure (Exercise) 136/70    Blood Pressure (Exit) 120/70    Heart Rate (Admit) 97 bpm    Heart Rate (Exercise) 110 bpm    Heart Rate (Exit) 100 bpm    Oxygen Saturation (Admit) 95 %    Oxygen Saturation (Exercise) 93 %    Oxygen Saturation (Exit) 94 %    Rating of Perceived Exertion (Exercise) 12    Perceived Dyspnea (Exercise) 2    Duration Continue with 30 min of aerobic exercise without signs/symptoms of physical distress.    Intensity THRR unchanged      Progression   Progression Continue to progress workloads to maintain intensity without signs/symptoms of physical distress.      Resistance Training   Training Prescription Yes    Weight orange bands    Reps 10-15    Time 10 Minutes      Oxygen   Oxygen Continuous    Liters 2      NuStep   Level 5    SPM 80    Minutes 15      METs 2.8      Arm Ergometer   Level 3    RPM 51    Minutes 15           Functional Capacity:  6 Minute Walk    Row Name 01/08/20 1547         6 Minute Walk   Phase Initial     Distance 1280 feet     Walk Time 6 minutes     # of Rest Breaks 0     MPH 2.42     METS 2.96     RPE 13     Perceived Dyspnea  2     VO2 Peak 10.35     Symptoms No     Resting HR 70 bpm     Resting BP 118/62     Resting Oxygen Saturation  95 %     Exercise Oxygen Saturation  during 6 min walk 92 %     Max Ex. HR 98 bpm     Max Ex. BP 118/60     2 Minute Post BP 112/60           Interval HR   1 Minute HR 89     2 Minute HR 90     3 Minute HR 96     4 Minute HR 96     5 Minute HR 96     6 Minute HR 98     2 Minute Post HR 96     Interval Heart Rate? Yes           Interval Oxygen   Interval Oxygen? Yes     Baseline Oxygen Saturation % 95 %     1 Minute Oxygen Saturation % 97 %     1 Minute Liters of Oxygen 2 L     2 Minute Oxygen Saturation % 97 %     2 Minute Liters of Oxygen 2 L     3 Minute Oxygen Saturation % 97 %     3 Minute Liters of Oxygen 2 L     4 Minute Oxygen Saturation %  98 %     4 Minute Liters of Oxygen 2 L     5 Minute Oxygen Saturation % 94 %     5 Minute Liters of Oxygen 2 L     6 Minute Oxygen Saturation % 92 %     6 Minute Liters of Oxygen 2 L     2 Minute Post Oxygen Saturation % 96 %     2 Minute Post Liters of Oxygen 2 L            Psychological, QOL, Others - Outcomes: PHQ 2/9: Depression screen PHQ 2/9 01/08/2020 01/08/2020  Decreased Interest 0 0  Down, Depressed, Hopeless 0 0  PHQ - 2 Score 0 0  Altered sleeping 0 -  Tired, decreased energy 3 -  Change in appetite 0 -  Feeling bad or failure about yourself  0 -  Trouble concentrating 0 -  Moving slowly or fidgety/restless 0 -  Suicidal thoughts 0 -  PHQ-9 Score 3 -  Difficult doing work/chores Somewhat difficult -  Some encounter information is confidential and restricted. Go to Review Flowsheets activity to see all data.    Quality of Life:   Personal Goals: Goals established at orientation with interventions provided to work toward goal.  Personal Goals and Risk Factors at Admission - 01/08/20 1014        Core Components/Risk Factors/Patient Goals on Admission    Weight Management Weight Maintenance    Improve shortness of breath with ADL's Yes    Intervention Provide education, individualized exercise plan and daily activity instruction to help decrease symptoms of SOB with activities of daily living.    Expected Outcomes Short Term: Improve cardiorespiratory fitness to achieve a reduction of symptoms when performing ADLs;Long Term: Be able to perform more ADLs without symptoms or delay the onset of symptoms            Personal Goals Discharge:  Goals and Risk Factor Review    Row Name 01/17/20 1132 02/12/20 1344 02/29/20 1124 04/01/20 1407       Core Components/Risk Factors/Patient Goals Review   Personal Goals Review Weight Management/Obesity;Develop more efficient breathing techniques such as purse lipped breathing and diaphragmatic breathing and practicing  self-pacing with activity.;Improve shortness of breath with ADL's;Increase knowledge of respiratory medications and ability to use respiratory devices properly. Weight Management/Obesity;Increase knowledge of respiratory medications and ability to use respiratory devices properly.;Improve shortness of breath with ADL's;Develop more efficient breathing techniques such as purse lipped breathing and diaphragmatic breathing and practicing self-pacing with activity. Improve shortness of breath with ADL's;Increase knowledge of respiratory medications and ability to use respiratory devices properly.;Develop more efficient breathing techniques such as purse lipped breathing and diaphragmatic breathing and practicing self-pacing with activity. Develop more efficient breathing techniques such as purse lipped breathing and diaphragmatic breathing and practicing self-pacing with activity.;Increase knowledge of respiratory medications and ability to use respiratory devices properly.;Improve shortness of breath with ADL's    Review Kalyn completed 1 exercise session.  Not enough information at this point to assess progress.  Anticipate that Wilman who is committed to getting healthy and strong so that he will not need lung transplant will make progress toward admission goals. [place]ding cannot see a major improvement in his strength, stamina, or shortness of breath, but he is not exercising outside of pulmonary rehab. Is getting stronger, is to be evaluated @ [facility] for a possible lung transplant.  Exercising @ level 4 on nustep and level 2.5 on the arm ergomenter. Draven was exposed to a family member with COVID, he has symptoms of congestion, awaiting test results to see if he can return to pulmonary rehab.    Expected Outcomes See Admission Goals That Masiyah will see an improvement in his strength, stamina, and shortness of breath in the next 30 days and that he will exercise at home 2-3 days/week in addition to exercising in  pulmonary rehab. For Nicko to continue to progress and become stronger and exercise at home after graduation from program. For Lennard to graduate 04/12/2019 and continue exercising on his own after graduation.           Exercise Goals and Review:  Exercise Goals    Row Name 01/08/20 0958 01/08/20 1553           Exercise Goals   Increase Physical Activity Yes Yes      Intervention Provide advice, education, support and counseling about physical activity/exercise needs.;Develop an individualized exercise prescription for aerobic and resistive training based on initial evaluation findings, risk stratification, comorbidities and participant's personal goals. Provide advice, education, support and counseling about physical activity/exercise needs.;Develop an individualized exercise prescription for aerobic and resistive training based on initial evaluation findings, risk stratification, comorbidities and participant's personal goals.      Expected Outcomes Short Term: Attend rehab on a regular basis to increase amount of physical activity.;Long Term:   Add in home exercise to make exercise part of routine and to increase amount of physical activity.;Long Term: Exercising regularly at least 3-5 days a week. Short Term: Attend rehab on a regular basis to increase amount of physical activity.;Long Term: Add in home exercise to make exercise part of routine and to increase amount of physical activity.;Long Term: Exercising regularly at least 3-5 days a week.      Increase Strength and Stamina Yes Yes      Intervention Provide advice, education, support and counseling about physical activity/exercise needs.;Develop an individualized exercise prescription for aerobic and resistive training based on initial evaluation findings, risk stratification, comorbidities and participant's personal goals. Provide advice, education, support and counseling about physical activity/exercise needs.;Develop an individualized  exercise prescription for aerobic and resistive training based on initial evaluation findings, risk stratification, comorbidities and participant's personal goals.      Expected Outcomes Short Term: Increase workloads from initial exercise prescription for resistance, speed, and METs.;Short Term: Perform resistance training exercises routinely during rehab and add in resistance training at home;Long Term: Improve cardiorespiratory fitness, muscular endurance and strength as measured by increased METs and functional capacity (6MWT) Short Term: Increase workloads from initial exercise prescription for resistance, speed, and METs.;Short Term: Perform resistance training exercises routinely during rehab and add in resistance training at home;Long Term: Improve cardiorespiratory fitness, muscular endurance and strength as measured by increased METs and functional capacity (6MWT)      Able to understand and use rate of perceived exertion (RPE) scale Yes Yes      Intervention Provide education and explanation on how to use RPE scale Provide education and explanation on how to use RPE scale      Expected Outcomes Short Term: Able to use RPE daily in rehab to express subjective intensity level;Long Term:  Able to use RPE to guide intensity level when exercising independently Short Term: Able to use RPE daily in rehab to express subjective intensity level;Long Term:  Able to use RPE to guide intensity level when exercising independently      Able to understand and use Dyspnea scale Yes Yes      Intervention Provide education and explanation on how to use Dyspnea scale Provide education and explanation on how to use Dyspnea scale      Expected Outcomes Short Term: Able to use Dyspnea scale daily in rehab to express subjective sense of shortness of breath during exertion;Long Term: Able to use Dyspnea scale to guide intensity level when exercising independently Short Term: Able to use Dyspnea scale daily in rehab to  express subjective sense of shortness of breath during exertion;Long Term: Able to use Dyspnea scale to guide intensity level when exercising independently      Knowledge and understanding of Target Heart Rate Range (THRR) Yes Yes      Intervention Provide education and explanation of THRR including how the numbers were predicted and where they are located for reference Provide education and explanation of THRR including how the numbers were predicted and where they are located for reference      Expected Outcomes Short Term: Able to state/look up THRR;Long Term: Able to use THRR to govern intensity when exercising independently;Short Term: Able to use daily as guideline for intensity in rehab Short Term: Able to state/look up THRR;Long Term: Able to use THRR to govern intensity when exercising independently;Short Term: Able to use daily as guideline for intensity in rehab      Understanding of Exercise Prescription Yes Yes  Intervention Provide education, explanation, and written materials on patient's individual exercise prescription Provide education, explanation, and written materials on patient's individual exercise prescription      Expected Outcomes Short Term: Able to explain program exercise prescription;Long Term: Able to explain home exercise prescription to exercise independently Short Term: Able to explain program exercise prescription;Long Term: Able to explain home exercise prescription to exercise independently             Exercise Goals Re-Evaluation:  Exercise Goals Re-Evaluation    Row Name 01/18/20 0808 02/08/20 0804 03/05/20 0751 04/02/20 0812       Exercise Goal Re-Evaluation   Exercise Goals Review Increase Physical Activity;Increase Strength and Stamina;Able to understand and use rate of perceived exertion (RPE) scale;Able to understand and use Dyspnea scale;Knowledge and understanding of Target Heart Rate Range (THRR);Understanding of Exercise Prescription Increase  Physical Activity;Increase Strength and Stamina;Able to understand and use rate of perceived exertion (RPE) scale;Able to understand and use Dyspnea scale;Knowledge and understanding of Target Heart Rate Range (THRR);Understanding of Exercise Prescription Increase Physical Activity;Increase Strength and Stamina;Able to understand and use rate of perceived exertion (RPE) scale;Able to understand and use Dyspnea scale;Knowledge and understanding of Target Heart Rate Range (THRR);Understanding of Exercise Prescription Increase Physical Activity;Increase Strength and Stamina;Able to understand and use rate of perceived exertion (RPE) scale;Able to understand and use Dyspnea scale;Knowledge and understanding of Target Heart Rate Range (THRR);Understanding of Exercise Prescription    Comments Pt has completed 1 exercise session and tolerated well. He is exercising at 2.1 METS on the Nustep and 46 RPM's on the Arm Ergometer. Will continue to monitor and progress as able Pt has completed 7 exercise sessions and has been making steady progressions with workloads and METS. He is exercising at 2.1 METS on the Nustep and 49 WATTS on the Arm Ergometer. Will continue to monitor and progress as he is able. Averill has completed 12 exercise sessions and has been slow to make progressions as of late. He started out doing pretty well but his METS have been averaging the same over the last few weeks. He has a lot of family stuff going on, but physically he has not had any complaints with exercise. He is making slow increases with workload levels on the Nustep and Arm Ergometer. He is exercising at 2.2 METS on the Nustep and 48 WATTS on the Arm ergometer. Evon has completed 16 exercise sessions and has been more consistent with making workload and MET increases. He is exercising at 2.8 METS on the Nustep and 53 WATTS on the arm ergometer.. He has been out of rehab for over a week due to being exposed to COVID-19 and awaiting COVID test  results. Will continue to monitor and progress as he is able.    Expected Outcomes Through exercise at rehab and home the patient will decrease shortness of breath with daily activities and feel confident in carrying out an exercise regimn at home. Through exercise at rehab and home the patient will decrease shortness of breath with daily activities and feel confident in carrying out an exercise regimn at home. Through exercise at rehab and home the patient will decrease shortness of breath with daily activities and feel confident in carrying out an exercise regimn at home. Through exercise at rehab and home the patient will decrease shortness of breath with daily activities and feel confident in carrying out an exercise regimn at home.           Nutrition & Weight -   Outcomes:  Pre Biometrics - 01/08/20 1554      Pre Biometrics   Grip Strength 19 kg            Nutrition:  Nutrition Therapy & Goals - 01/30/20 1455      Nutrition Therapy   Diet Generally healthful      Personal Nutrition Goals   Nutrition Goal Eat more fruits for snacks    Personal Goal #2 Choose 3 whole grains daily    Personal Goal #3 Continue eating beans and veggies with dinner      Intervention Plan   Intervention Prescribe, educate and counsel regarding individualized specific dietary modifications aiming towards targeted core components such as weight, hypertension, lipid management, diabetes, heart failure and other comorbidities.    Expected Outcomes Short Term Goal: Understand basic principles of dietary content, such as calories, fat, sodium, cholesterol and nutrients.           Nutrition Discharge:  Nutrition Assessments - 01/17/20 1340      Rate Your Plate Scores   Pre Score 43           Education Questionnaire Score:  Knowledge Questionnaire Score - 01/08/20 1559      Knowledge Questionnaire Score   Pre Score 17/18           Goals reviewed with patient; copy given to patient.

## 2020-04-24 DIAGNOSIS — G4733 Obstructive sleep apnea (adult) (pediatric): Secondary | ICD-10-CM | POA: Diagnosis not present

## 2020-04-24 DIAGNOSIS — J479 Bronchiectasis, uncomplicated: Secondary | ICD-10-CM | POA: Diagnosis not present

## 2020-04-24 DIAGNOSIS — J471 Bronchiectasis with (acute) exacerbation: Secondary | ICD-10-CM | POA: Diagnosis not present

## 2020-05-01 DIAGNOSIS — Z7682 Awaiting organ transplant status: Secondary | ICD-10-CM | POA: Diagnosis not present

## 2020-05-02 ENCOUNTER — Other Ambulatory Visit: Payer: Self-pay | Admitting: Family Medicine

## 2020-05-02 DIAGNOSIS — J9611 Chronic respiratory failure with hypoxia: Secondary | ICD-10-CM | POA: Diagnosis not present

## 2020-05-02 DIAGNOSIS — J479 Bronchiectasis, uncomplicated: Secondary | ICD-10-CM | POA: Diagnosis not present

## 2020-05-02 DIAGNOSIS — R0602 Shortness of breath: Secondary | ICD-10-CM | POA: Diagnosis not present

## 2020-05-02 DIAGNOSIS — Z01818 Encounter for other preprocedural examination: Secondary | ICD-10-CM | POA: Diagnosis not present

## 2020-05-02 DIAGNOSIS — J984 Other disorders of lung: Secondary | ICD-10-CM | POA: Diagnosis not present

## 2020-05-02 DIAGNOSIS — Z942 Lung transplant status: Secondary | ICD-10-CM

## 2020-05-02 DIAGNOSIS — Z7682 Awaiting organ transplant status: Secondary | ICD-10-CM | POA: Diagnosis not present

## 2020-05-02 DIAGNOSIS — R9389 Abnormal findings on diagnostic imaging of other specified body structures: Secondary | ICD-10-CM | POA: Diagnosis not present

## 2020-05-03 DIAGNOSIS — J479 Bronchiectasis, uncomplicated: Secondary | ICD-10-CM | POA: Diagnosis not present

## 2020-05-03 DIAGNOSIS — J471 Bronchiectasis with (acute) exacerbation: Secondary | ICD-10-CM | POA: Diagnosis not present

## 2020-05-03 DIAGNOSIS — G4733 Obstructive sleep apnea (adult) (pediatric): Secondary | ICD-10-CM | POA: Diagnosis not present

## 2020-05-03 NOTE — Unmapped (Signed)
[facility] Refill Coordination Note    Specialty Medication(s) to be Shipped:   CF/Pulmonary: -Nebulized colistin (150mg  vials) and supply kit  Other medication(s) to be shipped: BS Luer-Lok Syringes, BD NS Flushes, BD Needles, LC Plus neb cups, Spiriva Respimat 2.37mcg HFA     Jordan [name], DOB: 12-Oct-1955  Phone: [phone] (home)     All above HIPAA information was verified with patient.     Was a Nurse, learning disability used for this call? No    Completed refill call assessment today to schedule patient's medication shipment from the [facility] [phone]).       Specialty medication(s) and dose(s) confirmed: Regimen is correct and unchanged.   Changes to medications: Madex reports no changes at this time.  Changes to insurance: No  Questions for the pharmacist: No    Confirmed patient received Welcome Packet with first shipment. The patient will receive a drug information handout for each medication shipped and additional FDA Medication Guides as required.       DISEASE/MEDICATION-SPECIFIC INFORMATION        For CF patients: CF [facility] Active? No-not enrolled    SPECIALTY MEDICATION ADHERENCE     Medication Adherence    Patient reported X missed doses in the last month: 0  Specialty Medication: Colistimethate 150mg   Patient is on additional specialty medications: Yes  Additional Specialty Medications: Tobramycin 300mg /24ml  Patient Reported Additional Medication X Missed Doses in the Last Month: 0  Patient is on more than two specialty medications: No  Informant: patient  Reliability of informant: reliable  Reasons for non-adherence: no problems identified  Support network for adherence: family member        Colistimethate 150mg : 0 days of medicine on hand (Start date 05/21/20)    SHIPPING     Shipping address confirmed in Epic.     Delivery Scheduled: Yes, Expected medication delivery date: 05/14/2020.     Medication will be delivered via UPS to the prescription address in Epic WAM.    Jordan [name] Shared [facility] Specialty Technician

## 2020-05-06 ENCOUNTER — Encounter: Admit: 2020-05-06 | Discharge: 2020-05-08 | Payer: MEDICARE

## 2020-05-06 DIAGNOSIS — G4733 Obstructive sleep apnea (adult) (pediatric): Secondary | ICD-10-CM | POA: Diagnosis not present

## 2020-05-06 DIAGNOSIS — G4710 Hypersomnia, unspecified: Secondary | ICD-10-CM | POA: Diagnosis not present

## 2020-05-09 MED ORDER — DORNASE ALFA 1 MG/ML SOLUTION FOR INHALATION
Freq: Every day | RESPIRATORY_TRACT | 3 refills | 90 days
Start: 2020-05-09 — End: 2021-05-09

## 2020-05-09 NOTE — Unmapped (Signed)
[facility]    05/10/20    9:27 AM    Assessment:      Patient:Doron L Marga Hoots (December 19, 1955)    Mr. Stidham is a 65 y.o. male who is seen for follow up of bronchiectasis with chronic Pseudomonas colonization and chronic hypoxemic respiratory failure requiring supplemental oxygen with exertion and sleep. Overall stable since I last saw him. Unfortunately, not a candidate for lung transplantation at [facility]. Going to [facility] to be evaluated.  [facility] is out of network for lung transplant.     Plan:      Problem List Items Addressed This Visit        Respiratory    Bronchiectasis (CMS-HCC) - Primary    Relevant Medications    dornase alfa (PULMOZYME) 1 mg/mL nebulizer solution    Other Relevant Orders    Lower Respiratory Culture    AFB culture    Ambulatory referral to Pulm Rehab    Flow volume loop    Ambulatory referral to Palliative Care    Chronic respiratory failure with hypoxia (CMS-HCC)    Relevant Orders    Ambulatory referral to Palliative Care       Other    Pseudomonas aeruginosa infection    Relevant Orders    Ambulatory referral to Palliative Care        No changes were made to airway clearance regimen.  No changes were made to inhaled medications. Looking into obtaining ceftazidime IV solution to nebulize since unable to get insurance coverage for [name] or [place].  Continue using Pulmozyme (reordered) and Spiriva (started at last visit).  Given eosinophilia, wonder if he might benefit from anti-IL-5 therapy since already on Symbicort and Spiriva.  Will obtain pre/post testing at next visit (holding inhalers) to assess for reversibility.    Will continue to use oxygen whenever ambulating/exerting himself based on prior walk test.  No plans to repeat PSG with more supine sleep.    Patient was unable to expectorate sputum today to be sent for bacterial and AFB cultures.  If he has future exacerbations, sputum should be sent for bacterial and AFB cultures. The bacterial culture should be processed like a cystic fibrosis sample given the overlapping pathogens. There are standing orders for these cultures in our system.    Since Ewin didn't get any strength training at [facility], referring him to [facility] for telehealth pulmonary rehab.     Agree with Hep A and Hep B vaccines and getting DEXA locally.    Placed referral to our palliative care team.  Would like [place] to see them on the same day he returns to see me.    Hrithik will return to clinic in 3 months for follow-up with pre-bronchodilator spirometry and sputum cultures.  He will call or send me a message via MyChart if questions or concerns arise before this visit.     The above plan was discussed with the patient and he is in agreement.      I personally spent 45 minutes face-to-face and non-face-to-face in the care of this patient, which includes all pre, intra, and post visit time on the date of service.     Subjective:      HPI: Mr. Rosenbloom is a 65 y.o. male who is seen for follow up of bronchiectasis with PsA colonization and prior history of multiple lung resections.    09/27/18:  I last saw him on 05/17/18. He reached out at the  end of May reporting symptoms c/w an exacerbation. I treated him with a two week course of Ciprofloxacin, which seemed to help.  Reports that he feels winded too much - if walks up incline or up a couple hundred feet. This has worsened over the past year. Has to stop and let lungs catch up.  Of note, he has been off testosterone supplementation for the past year after he was diagnosed with prostate cancer.    He received the [facility], which he is using with nebulized HTS 7% and Pulmozyme. After using the Vest for 6-8 weeks, he noticed improvement with more phlegm mobilization. Sometimes has to pause it to cough to get stuff up.  Plugging feeling happening a lot more, especially with the vest.  Most of the time, sputum is thinned out with Pulmozyme but still gets those plugs.    Had to stop eating ice cream or drinking milkshake - will cough will eating and that makes him vomit on occasion. Past few weeks, when eating will cough, no matter what he eats.  Happens almost once a day. Doesn't feel like things get stuck typically.      02/07/19:  Following his last visit, Asriel reached out regarding portable oxygen.  We had considered the life 2000 but he does not qualify because he does not use supplemental oxygen.  He performed the 6-minute walk test prior to today's visit but did not desaturate.  He wonders if this is because the test was done in the morning and he doesn't have breathing issues in the morning.  Typically, breathing issues occur in afternoon.  During these episodes, he will have to use his inhaler and feels like he is suffocating.  He describes it as an uncomfortable feeling that limits his ability to do things with family.  If he is having trouble catching breath, oximetry is 92-93%. Has seen it in the 80s if comes in from outside and is short of breath.    For airway clearance, he is nebulizing 10% hypertonic saline and using his percussive vest for 20 minute sessions in AM and PM. Has to pause it to cough and get stuff up.  Coughing up more stuff since last visit and is waking up 1-2 times a night with cough.having coughing fits between clearance session.  What he expectorates is less of a mucous plug and mostly stringy sputum that is yellowish to light brown in color.  No hemoptysis.  On Saturday, he had a coughing fit that nearly triggered posttussive emesis.  He only expectorated about 3-4 globs of phlegm.  Nebulizing Pulmozyme daily.  Alternates [name] and colistin every other month.  On [name] this month and will start Colistin on Dec 1.    06/06/19:  Coughing more and more sputum.  Waking up 1-2 times per night coughing. Can cough once or just keep coughing.  Sputum is yellow in color without evidence of blood.  He is not expectorating green sputum currently. On colistin this month; alternates with [name]. Went through January off all inhaled antibiotics.  Oxygen levels in the low 90s.  Uses rescue inhaler if goes up stairs.  Regularly performing airway clearance with nebulized 10% hypertonic saline twice a day, percussive vest twice a day, and Pulmozyme daily. He last received IV antibiotics in January 2019 but has been treated with oral antibiotics on 04/19/18 and 08/17/18, 03/10/19.      11/10/19:  1-2 times per week can get sharp right sided chest pain - even with  nipple line. Can happen with clearance or activity or rest. Never during sleep. Following his hospitalization, he first experienced it in June. Lasts only about 10 seconds. Not reproducible. Limited activity due to dyspnea. Regularly performing airway clearance with nebulized 10% hypertonic saline twice a day, percussive vest twice a day, and Pulmozyme daily. Remains on chronic azithromycin therapy.    02/13/20:  Showed up to [facility] with oxygen saturations of 87-88% after walking from parking lot (<100 yd) and up incline without supplemental oxygen.  Using 2L continuous flow with exertion and sleep.  Had sleep study done at [facility] on RA and was told that he should be fine with just the supplemental oxygen. I do not have this report. Using oxygen when goes to football game; needs long period to recover from activity.  Going up 20 foot incline with oxygen, will have to stop and catch breath.     Wheezing more lately and endorses chest tightness. Has need to use albuterol as rescue 3-4 times per day.  Used albuterol in the car on the way here. Typically would be able to cough up large amount of mucus after PFTs but only coughed up small amount. Feels like mucus is stuck in lungs despite twice daily airway clearance with 10% HTS and Vest. Also using Pulmozyme once a day and Symbicort twice a day after clearance.  Using colistin this month. He ordered the [name] nebs to use next month even though insurance won't cover it. Feels like inhaled antibiotics definitely help.    05/10/20:   Saw [facility] for lung transplant evaluation.  Felt to be poor candidate due to prior surgeries and primary disease.  Candidacy closed at [facility]. Referral sent to [facility] and [facility] on 2/15.  Based on labs and testing done there, getting Hep B vaccination series (not Hep A or Hep B immune).  Also getting DEXA that was ordered by Dr. Abigail Miyamoto.    Had PSG done at [facility] - no evidence of OSA but does have increased upper airway resistance.  He never sleeps on his back; only sleeps on side or stomach.   Fatigue is most burdensome symptom.    Regrets that he let the surgeon operate on his lung before he met me.  He completed pulmonary rehab at [facility] but they only had him on machine for ~30 minutes and then stretching.  No strength exercises. He is planning on joining a gym based on recommendations from [facility] transplant.  Was surprised that he didn't desaturate during his walk test at [facility].    Past Medical History:   Diagnosis Date   ??? Abscess of lung (CMS-HCC) 11/06/2010    CT Chest 10/22/10 12/05/2010 right thoracotomy resection of right middle lobe and resection of right lower lobe abscess    ??? Arthritis     back   ??? Biceps tendon tear 2013    left side   ??? Bronchiectasis (CMS-HCC)     chronic psuedomonas infection   ??? Degenerative joint disease of left acromioclavicular joint    ??? GERD (gastroesophageal reflux disease)    ??? Hypertension    ??? Obstructive sleep apnea on CPAP 03/14/13    AHI 33.5, on BiPAP 12/8 based on sleep study 09/2013   ??? Pneumonia 2012    Lung abcess    ??? PONV (postoperative nausea and vomiting)    ??? Prostate cancer (CMS-HCC) 09/09/2017   ??? Rotator cuff injury left   ??? Vertigo  Past Surgical History:   Procedure Laterality Date   ??? APPENDECTOMY  1969   ??? BACK SURGERY     ??? BRONCHOSCOPY  10/19/2010    [facility]   ??? CARPAL TUNNEL RELEASE  1999   ??? LUNG REMOVAL, PARTIAL Right 10/2010    RML and RLL partially resected   ??? MENISCECTOMY  2011   ??? NECK SURGERY  1993, 1999, 2002   ??? PR GERD TST W/ MUCOS IMPEDE ELECTROD,>1HR N/A 07/06/2012    Procedure: ESOPHAGEAL FUNCTION TEST, GASTROESOPHAGEAL REFLUX TEST W/ NASAL CATHETER INTRALUMINAL IMPEDANCE ELECTRODE(S) PLACEMENT, RECORDING, ANALYSIS AND INTERPRETATION; PROLONGED;  Surgeon: None None;  Location: [facility];  Service: Gastroenterology   ??? PR OPEN TREAT RIB FRACTURE W/INT FIXATION, UNILATERAL, 1-2 RIBS Right 03/19/2015    Procedure: OPEN TREATMENT OF RIB FRACTURE REQUIRING INTERNAL FIXATION, UNILATERAL; 1-2 RIBS;  Surgeon: Evert Kohl, MD;  Location: MAIN OR [facility];  Service: Cardiothoracic   ??? PR THORACOTOMY W/THERAP WEDGE RESEXN ADDL IPSILATRL Right 08/07/2013    Procedure: THORACOTOMY; WITH THERAPEUTIC LOBECTOMY OF RIGHT MIDDLE AND RIGHT LOWER LOBE RESECTION , EACH ADDITIONAL RESECTION, IPSILATERAL;  Surgeon: Alvester Chou, MD;  Location: MAIN OR [facility];  Service: Cardiothoracic   ??? TONSILLECTOMY  1965       Family History   Problem Relation Age of Onset   ??? Alzheimer's disease Mother    ??? Stroke Mother    ??? Bronchiectasis  Brother         presumed   ??? Bronchiectasis  Brother         presumed   ??? Liver disease Father    ??? Diabetes Father    ??? Heart failure Father    ??? Bronchiectasis  Daughter    ??? Asthma Son         childhood asthma   ??? Clotting disorder Neg Hx    ??? Anesthesia problems Neg Hx        Social History     Tobacco Use   ??? Smoking status: Never Smoker   ??? Smokeless tobacco: Never Used   Substance Use Topics   ??? Alcohol use: No     Alcohol/week: 0.0 standard drinks   ??? Drug use: No       Allergies  Reviewed on 05/10/2020      Reactions Comment    Gentamicin Other (See Comments) BALANCE ISSUES    Tobramycin Other (See Comments) ototoxicity    Morphine Nausea And Vomiting         Current Outpatient Medications   Medication Sig Dispense Refill   ??? albuterol 2.5 mg /3 mL (0.083 %) nebulizer solution Inhale 3 mL (2.5 mg total) by nebulization Two (2) times a day. 540 mL 3   ??? albuterol HFA 90 mcg/actuation inhaler Inhale 2 puffs Four (4) times a day. 25.5 g 3   ??? alcohol swabs (ALCOHOL PREP PADS) PadM Use as directed with inhaled antibiotics 100 each 5   ??? ascorbic acid, vitamin C, (VITAMIN C) 1000 MG tablet Take 1,000 mg by mouth daily.     ??? azithromycin (ZITHROMAX) 250 MG tablet Take 1 tablet (250 mg total) by mouth daily. 90 tablet 3   ??? colistimethate (COLYMYCIN) 150 mg injection Inject 2mL sterile water for injection to mix colistin vial, then draw up 2mL (150mg ) and inhale 2 times a day,28 days on and 28 days off. 60 each 5   ??? empty container Misc USE AS DIRECTED 1 each 2   ???  ferrous sulfate (IRON, FERROUS SULFATE,) 325 (65 FE) MG tablet Take 325 mg by mouth Two (2) times a day. BID     ??? HYDROcodone-acetaminophen (NORCO) 7.5-325 mg per tablet Take 1 tablet by mouth three (3) times a day (at 6am, noon and 6pm).     ??? HYDROcodone-homatropine (HYCODAN) 5-1.5 mg/5 mL syrup Take 5 mL by mouth every four (4) hours as needed for cough. 120 mL 0   ??? montelukast (SINGULAIR) 10 mg tablet Take 1 tablet (10 mg total) by mouth nightly. 90 tablet 3   ??? multivitamin-minerals-lutein Tab Take 1 tablet by mouth daily.      ??? nebulizers (LC PLUS) Misc use with Colistin 1 each 5   ??? nebulizers (LC PLUS) Misc Use as directed with hypertonic saline 10% 3 each 3   ??? needle, disp, 21 G (BD REGULAR BEVEL NEEDLES) 21 gauge x 1 1/2 Ndle Use as directed with inhaled Colistin 100 each 3   ??? omeprazole (PRILOSEC) 20 MG capsule Take 20 mg by mouth Two (2) times a day.      ??? sodium chloride (BD POSIFLUSH NORMAL SALINE 0.9) 0.9 % injection Inject 2mL of 0.9%NaCl into colistin vial & gently mix. After withdrawing colistin dose, add an additional 1mL of 0.9%NaCl to neb cup with the colistin dose. 180 mL 11   ??? sodium chloride 10 % Nebu Inhale 5 mL by nebulization Two (2) times a day. 900 mL 3   ??? sterile water Soln Use 2mL to mix Colistin,then add additional 2mL to neb cup with 2mL of mixed colistin.Inhale twice daily 28 days on and 28 days off. 600 mL 5   ??? SYMBICORT 160-4.5 mcg/actuation inhaler INHALE 2 PUFFS INTO THE LUNGS 2 TIMES A DAY. 30.6 g 1   ??? syringe with needle (BD LUER-LOK SYRINGE) 3 mL 20 gauge x 1 1/2 Syrg For use with inhaled antibiotic (Colistin) 60 each 30   ??? syringe with needle (BD LUER-LOK SYRINGE) 3 mL 20 gauge x 1 Syrg For use with inhaled antibiotic (Colistin) 60 each 5   ??? tiotropium bromide (SPIRIVA RESPIMAT) 2.5 mcg/actuation inhalation mist Inhale 2 puffs daily. 12 g 3   ??? tobramycin, PF, ([name]) 300 mg/5 mL nebulizer solution Inhale 5 mL (300 mg total) by nebulization every twelve (12) hours. 28 days on and 28 days off. 280 mL 5   ??? dornase alfa (PULMOZYME) 1 mg/mL nebulizer solution Inhale 2.5 mg daily. Use at least 30-60 minutes before airway clearance, or after airway clearance 225 mL 3     No current facility-administered medications for this visit.     Physical Exam:   BP 128/72  - Pulse 77  - Temp 36.3 ??C (97.3 ??F) (Temporal)  - Resp 18  - Ht 175.3 cm (5' 9)  - Wt 82.9 kg (182 lb 12.8 oz)  - SpO2 94%  - BMI 26.99 kg/m??   Well appearing white male in no acute distress, in good spirits. Easy work of breathing without accessory muscle use. Speaking in full sentences.  Crackles appreciated over left mid and  lower lung fields.  Decreased breath sounds over right mid and lower lung fields, consistent with prior resection.  Regular rate and rhythm with normal S1 and S2.  No murmurs, rubs, or gallops.  Clubbing unchanged from prior.  No lower extremity edema.    Diagnostic Review:   The following data were reviewed during this visit with key findings summarized below:  Pulmonary Function Testing:  [facility]rom[facility] week consistent with severe airway obstruction and severe restriction.  FVC improved as compared to prior.   FVC (% predicted) FEV1 (% predicted) FEV1/FVC   11/10/2013 3.19 L (68%) 2.16 L (61%)  68% 02/23/2014 3.50 L (76%)  2.23 L (64%)  64%   07/12/2014  2.94 L (66%)  2.02 L (58%)  69%   01/15/2015  2.82 L (62%)  1.79 L (52%)  63%   02/08/2015  3.04 L (66%)  2.06 L (59%)  68%   04/02/2015  2.86 L (63%)  1.83 L (53%)  64%   12/10/2015  2.77 L (62%)  1.62 L (48%)  58%   01/27/2016  2.42 L (54%)  1.55 L (46%)  64%   03/24/2016  3.36 L (75%)  1.99 L (59%)  59%   07/21/2016  2.56 L (57%)  1.54 L (46%)  60%   12/29/2016  2.80 L (63%)  1.57 L (47%)  56%   04/06/2017  2.61 L (59%)  1.46 L (44%)  56%   07/06/2017  2.56 L (58%)  1.49 L (45%)  58%   10/12/2017  2.63 L (59%)  1.47 L (44%)  56%   01/18/2018  2.68 L (63%)  1.36 L (42%)  51%   05/17/2018 2.49 L (57%)  1.25 L (38%)  50%   09/26/2018 2.43 L (57%)  1.26 L (39%)  52%   01/26/2019 2.16 L (51%)  1.16 L (36%)  54%   06/06/2019 2.47 L (59%) 1.11 L (34%) 45%   11/10/19 2.14 L (50%) 1.20 L (37%) 56%   02/13/20 2.01 L (48%) 1.08 L (34%) 54%   05/02/20 2.29 L (54%) 1.11 L (34%) 48%     6-minute Walk (12/06/19): Heart Rate: Resting Heart Rate was 71 bpm. Maximum Heart Rate achieved 97 bpm. Patient did reach target heart rate (85-132 bpm).  Heart rate returned to 77 bpm after 2 minutes of recovery.  Oxygen Saturation: Resting O2 Saturation was 94% on RA. O2 saturation nadir was 87% at minute 2. O2 saturation improved to 96% with 2L via [place] at minute 3 and maintained saturations above 88% for remainder of walk. O2 saturation returned to 96% on room air after 2 minutes of recovery.  Distance: Total 6 minute walk distance was 320 m. Predicted 6 minute walk distance 548.9 m (LLN of 395.8 m). Percent of predicted 6 minute walk distance of 58%. Testing included one 5-minute pause. Distance obtained after placed on 2L and walked for an additional 6 minutes.  BORG: Resting BORG score was 1. Maximum BORG score was 5 at minute 2. Concluding BORG score was 0 after 2 minutes of recovery. Impression: Significant exercise induced oxygen desaturation (below 88%). Exertional hypoxemia improved with 2L supplemental oxygen. Walk distance is less than expected suggesting a cardio-respiratory defect -- clinical correlation suggested.  Recommendation: Supplemental oxygen required with exercise at 2L [place]. Consider further evaluation with spirometry, lung volumes, respiratory muscle flow/force measurements, cardiopulmonary stress testing, and/or cardio/vascular studies.    6-Minute Walk (02/15/19): Resting Heart Rate was 91. Maximum Heart Rate achieved 119. Patient did reach target heart rate (86-133). Resting O2 Saturation was 98% on RA. Resting BORG score was 1. O2 saturation nadir was 91% at minute 6. Total 6 minute walk distance was 381 meters. Predicted 6 minute walk distance of 558 m (LLN of 456m). Percent of predicted 6 minute walk distance of 68%. Heart rate returned to 96 bpm after 2 minutes of recovery.  O2 saturation returned to 96% on RA after 2 minutes of recovery. Maximum BORG score was 7 at minute 6. Concluding BORG score was 2 after 2 minutes of recovery. No evidence of resting or exercise induced hypoxemia. No supplemental oxygen is required at this time.    6-Minute Walk (01/26/19): Resting Heart Rate was 86. Maximum Heart Rate achieved 97. Patient did reach target heart rate (86-133). Resting O2 Saturation was 99% on RA. Resting BORG score was 1. O2 saturation nadir was 95% at minutes 3 and 6. Total 6 minute walk distance was 350 meters. Predicted 6 minute walk distance of 558 m (LLN of 437m). Percent of predicted 6 minute walk distance of 63%. Heart rate returned to 86 bpm after 2 minutes of recovery. O2 saturation returned to 96% on RA after 2 /minutes of recovery. Maximum BORG score was 6 at minutes 5 and 6. Concluding BORG score was 1 after 2 minutes of recovery. No evidence of resting or exercise induced hypoxemia. No supplemental oxygen is required at this time.    Cultures:  ?? Source Bacterial Culture AFB Smear AFB Culture   04/02/15 Sputum 2+ OPF; 1+ Probably mPsA Negative Negative   07/02/15 Sputum 4+ OPF; 3+ mPsA; 3+ sPsA; 1+ Steno Negative Negative   01/23/16 Sputum 2+ OPF; 1+ mPsA; 1+ sPsA - -   03/24/16 Sputum 3+ OPF; 3+ mPsA; 3+ sPsA Negative Overgrown   07/21/16 Sputum 4+ OPF; 4+ mPsA; 2+ sPsA Negative Negative   12/29/16 Sputum 4+ OPF; 3+ mPsA; 3+ sPsA Negative Actinomadura sp   04/06/17 Sputum 3+ OPF; 4+ mPsA Negative Negative   07/06/17 Sputum 4+ OPF; 3+ mPsA Negative Negative   10/12/17 Sputum 4+ OPF; 3+ mPsA Negative Negative   01/18/18 Sputum OPF Negative Negative   05/17/18 Sputum 4+ OPF; 3+ mPsA - -   09/26/18 Sputum 4+ OPF; 3+ mPsA Negative Negative   01/26/19 Sputum 4+ OPF; 2+ mPsA Negative Negative   02/15/19 Sputum 3+ OPF; 3+ mPsA Negative Negative   06/06/19 Sputum 3+ OPF; 3+ sPsA; 3+ mPsA Negative Negative   11/10/19 Sputum 2+ OPF; 3+ mPsA Negative Negative   02/13/20 Sputum OPF Negative Negative   ??    Bronchiectasis Evaluation   IgG with subclasses:   Lab Results   Component Value Date/Time    IGGT 996 04/28/2012 06:21 AM    IGG1 402 04/28/2012 06:21 AM    IGG2 399 04/28/2012 06:21 AM    IGG3 148.0 (H) 04/28/2012 06:21 AM    IGG4 47.7 04/28/2012 06:21 AM     IgA (09/21/11): 283   IgM (09/21/11): 52   IgE:   Lab Results   Component Value Date/Time    IGE 16.6 02/13/2020 10:24 AM     Specific Titers: No results found for: TETANUSAB, TETGV, DIPHTERIAAB, DIPGV, S. pneumonia titers  HIV: No results found for: HIV  Autoimmune: No results found for: ANA, ANATITER1, PAT1, ANATITER2, PAT2, DSDNAAB, ENAS, RF, CCPAB, CCPIGG  ANCA: No results found for: ANCA, IFA, PR3ELISA, PR3QT, MPO, MPOQT  CBC-D (05/02/20): 6.1>14.8/44.3<156; ANC 4.1; ALC 1.2; AEC 0.17  Lab Results   Component Value Date/Time    WBC 6.0 02/13/2020 10:24 AM    WBC 5.4 04/19/2017 05:45 AM    WBC 5.2 04/18/2017 05:23 AM    WBC 7.9 08/13/2013 03:57 AM    WBC 9.4 08/12/2013 04:21 AM    WBC 11.7 (H) 08/11/2013 04:04 AM    HGB 14.4 02/13/2020 10:24 AM    HGB 14.8 03/24/2015 05:31  PM    HCT 43.0 02/13/2020 10:24 AM    HCT 30.8 (L) 08/13/2013 03:57 AM    PLT 167 02/13/2020 10:24 AM    PLT 292 08/13/2013 03:57 AM    NEUTROABS 3.7 02/13/2020 10:24 AM    NEUTROABS 3.2 04/19/2017 05:45 AM    NEUTROABS 3.1 04/18/2017 05:23 AM    NEUTROABS 4.9 06/21/2013 09:31 PM    NEUTROABS 4.6 06/21/2013 08:13 PM    NEUTROABS 4.1 04/26/2012 11:54 PM    LYMPHSABS 1.4 02/13/2020 10:24 AM    LYMPHSABS 1.4 (L) 04/19/2017 05:45 AM    LYMPHSABS 1.4 (L) 04/18/2017 05:23 AM    LYMPHSABS 1.9 06/21/2013 09:31 PM    LYMPHSABS 1.8 06/21/2013 08:13 PM    LYMPHSABS 2.8 04/26/2012 11:54 PM    EOSABS 0.2 02/13/2020 10:24 AM    EOSABS 0.2 04/19/2017 05:45 AM    EOSABS 0.2 04/18/2017 05:23 AM    EOSABS 0.2 06/21/2013 09:31 PM    EOSABS 0.2 06/21/2013 08:13 PM    EOSABS 0.3 04/26/2012 11:54 PM    BASOSABS 0.0 02/13/2020 10:24 AM    BASOSABS 0.0 06/21/2013 09:31 PM     Alpha-1 level: No results found for: A1TRYP  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    05/23/18:               Imaging  CXR (05/02/20): Images not available for review. Per report, Visualized cardiac and mediastinal contours within normal limits allowing for mediastinal shift to the right. Circumferential right pleural fluid/thickening. Volume loss in the right lung consistent with known right lung resection. Irregular opacities throughout both lungs, right greater than left, consistent with known bronchiectasis.     Chest CT (11/10/19): Images personally reviewed. Stable sequelae of right middle lobectomy and right lower lobe wedge resection. Stable consolidation in the medial posterior right upper lobe and right lung apex, bilateral diffuse extensive bronchiectasis and bronchial wall thickening and unchanged sequelae of chronic endobronchial infection.    Chest CT (02/15/19): Images personally reviewed. Stable sequelae of right middle lobectomy and right lower lobe wedge resection.  Unchanged diffuse extensive bronchiectasis and bronchial wall thickening bilaterally.  Diffusely scattered tree-in-bud nodules in bilateral lung parenchyma with interval increase in tree-in-bud nodules in left lung; the constellation of these findings are consistent with sequelae of worsening chronic endobronchial infection.    Chest CT (03/31/16): Images personally reviewed. Status post right middle and right lower lobectomies. Tiny loculated basilar right hydropneumothorax, probably chronic. Moderate cylindrical and varicoid bronchiectasis throughout both lungs, with associated scattered mucoid impaction, tree-in-bud opacities and scattered peribronchovascular and subpleural consolidation in the right upper lobe. 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 Two-vessel coronary atherosclerosis. Nonobstructing right nephrolithiasis.    Modified Barium Swallow (01/17/19): Shallow laryngeal penetration observed with thin liquids and puree.  No aspiration identified with any of the tested consistencies.    Immunization History   Administered Date(s) Administered   ??? COVID-19 VACC,MRNA,(PFIZER)(PF)(IM) 04/20/2019, 05/18/2019, 12/01/2019   ??? INFLUENZA TIV (TRI) 3MO+ W/ PRESERV (IM) 11/13/2010, 01/07/2012, 12/09/2017,  11/30/2019   ??? INFLUENZA TIV (TRI) PF (IM) 02/20/2012, 12/09/2012   ??? Influenza Vaccine Quad (IIV4 PF) 70mo+ injectable 01/31/2016, 12/09/2017   ??? Influenza Virus Vaccine, unspecified formulation 12/25/2014, 12/01/2016   ??? Influenza Whole 12/21/2008   ??? PNEUMOCOCCAL POLYSACCHARIDE 23 10/22/2010, 02/07/2016, 10/07/2019   ??? Pneumococcal Conjugate 13-Valent 04/28/2013   ??? TdaP 03/22/2012   ??? ZOSTAVAX - ZOSTER VACCINE, LIVE, SQ 07/03/2014, 06/11/2017, 01/19/2018     Portions of this record have been created using [employer]. Dictation errors have been sought, but may not have been identified and corrected.    Viona Gilmore, MD  05/10/20  9:27 AM

## 2020-05-09 NOTE — Unmapped (Addendum)
Referral to telehealth pulmonary rehab.  Will have you see palliative care next visit.     Hep A and Hep B vaccinations.  Agree with DEXA.    Symptoms of a bronchiectasis exacerbation: 48 hours of at least three of the following:  ?? Increased cough  ?? Change in volume or appearance of sputum  ?? Increased sputum purulence  ?? Worsening shortness of breath and/or exercise tolerance  ?? Fatigue and/or malaise  ?? Coughing up blood (hemoptysis)    If you are experiencing some of these symptoms:  ?? Increase the frequency and/or intensity of your airway clearance  ?? Try to submit a sputum sample for bacterial and AFB cultures (at [facility] or locally)  ?? Reach out to me or your local physician.  ?? If you need antibiotics, recommend treating for 14 days.      Please bring any new airway clearance equipment to your next visit to ensure that you are using and caring for it properly.    Information about getting your COVID-19 vaccine or COVID-19 treatment: [email or web address]    Interested in participating in trials for a COVID-19 vaccine or treatments for COVID-19? https://www.coronaviruspreventionnetwork.org    Thank you for allowing me to be a part of your care. Please call the clinic with any questions.    Viona Gilmore, MD, MPH  Pulmonary and Critical Care Medicine  [address]  [id number]  [place], [place] [address]    Thank you for your visit to the [facility]. You may receive a survey from [facility] regarding your visit today, and we are eager to use this feedback to improve your experience. Thank you for taking the time to fill it out.    Between appointments, you can reach [place] at these numbers:  ??? For appointments: [phone]  ??? For my nurse, Arsenio Loader RN: [phone]  ??? Fax: [phone]  ??? For urgent issues after hours: Hospital Operator @ [phone] & ask for Pulmonary Fellow on call    For further information, check out the websites below:    [facility] for Bronchiectasis: [email or web address]    Impact Airway Clearance Education: http://www.impact-be.com    Videos from the patient session of the World Bronchiectasis Conference in [place] on October 03, 2016 can be watched here (TrustyNews.es).     Bronchiectasis Toolbox (information about airway clearance): DiningCalendar.de    Copy for Individuals with Bronchiectasis and/or NTM through the Bronchiectasis Registry and the COPD Foundation: https://www.mckee-young.org/    Information about Non-tuberculous Mycobacterial Infections:  https://www.ntminfo.org     Interested in clinical trials and other research opportunities?  www.clinicaltrials.gov    The Rare Diseases Clinical Research Network [facility]) Genetic Disorders of  Mucociliary Clearance Consortium [facility]) Contact Registry is a way for patients with disorders of mucociliary clearance (such as bronchiectasis) and their family members to learn about research studies they may be able to join. Participation is completely voluntary and you may choose to withdraw at any time. There is no cost to join the [employer]. For more information or to join the registry please go to the following website:  [email or web address]

## 2020-05-10 ENCOUNTER — Encounter: Admit: 2020-05-10 | Discharge: 2020-05-11 | Payer: MEDICARE | Attending: Internal Medicine | Primary: Internal Medicine

## 2020-05-10 ENCOUNTER — Encounter: Admit: 2020-05-10 | Discharge: 2020-05-11 | Payer: MEDICARE

## 2020-05-10 DIAGNOSIS — J9611 Chronic respiratory failure with hypoxia: Principal | ICD-10-CM

## 2020-05-10 DIAGNOSIS — J479 Bronchiectasis, uncomplicated: Secondary | ICD-10-CM | POA: Diagnosis not present

## 2020-05-10 DIAGNOSIS — A498 Other bacterial infections of unspecified site: Secondary | ICD-10-CM | POA: Diagnosis not present

## 2020-05-10 MED ORDER — DORNASE ALFA 1 MG/ML SOLUTION FOR INHALATION
Freq: Every day | RESPIRATORY_TRACT | 3 refills | 90 days | Status: CP
Start: 2020-05-10 — End: 2021-05-10

## 2020-05-10 NOTE — Unmapped (Signed)
Met with patient in clinic today.  He has his POC with him today. No needs at this time.

## 2020-05-13 DIAGNOSIS — Z23 Encounter for immunization: Secondary | ICD-10-CM | POA: Diagnosis not present

## 2020-05-13 DIAGNOSIS — Z7682 Awaiting organ transplant status: Secondary | ICD-10-CM | POA: Diagnosis not present

## 2020-05-13 MED FILL — BD LUER-LOK SYRINGE 3 ML 20 GAUGE X 1 1/2": 30 days supply | Qty: 60 | Fill #1

## 2020-05-13 MED FILL — LC PLUS MISC: 30 days supply | Qty: 1 | Fill #6

## 2020-05-13 MED FILL — BD POSIFLUSH NORMAL SALINE 0.9 % INJECTION SYRINGE: 30 days supply | Qty: 180 | Fill #1

## 2020-05-13 MED FILL — BD REGULAR BEVEL NEEDLES 21 GAUGE X 1 1/2": 30 days supply | Qty: 60 | Fill #1

## 2020-05-13 MED FILL — SPIRIVA RESPIMAT 2.5 MCG/ACTUATION SOLUTION FOR INHALATION: RESPIRATORY_TRACT | 90 days supply | Qty: 12 | Fill #1

## 2020-05-13 MED FILL — COLISTIN (COLISTIMETHATE SODIUM) 150 MG SOLUTION FOR INJECTION: 30 days supply | Qty: 60 | Fill #5

## 2020-05-13 NOTE — Unmapped (Signed)
Faxed referral and e-faxed not to [facility] for pulmonary rehab.  Patient notified via MyChart.

## 2020-05-15 DIAGNOSIS — J479 Bronchiectasis, uncomplicated: Secondary | ICD-10-CM | POA: Diagnosis not present

## 2020-05-15 DIAGNOSIS — M159 Polyosteoarthritis, unspecified: Secondary | ICD-10-CM | POA: Diagnosis not present

## 2020-05-16 DIAGNOSIS — J479 Bronchiectasis, uncomplicated: Secondary | ICD-10-CM | POA: Diagnosis not present

## 2020-05-16 DIAGNOSIS — R0600 Dyspnea, unspecified: Secondary | ICD-10-CM | POA: Diagnosis not present

## 2020-05-16 DIAGNOSIS — J9610 Chronic respiratory failure, unspecified whether with hypoxia or hypercapnia: Secondary | ICD-10-CM | POA: Diagnosis not present

## 2020-05-16 DIAGNOSIS — R0602 Shortness of breath: Secondary | ICD-10-CM | POA: Diagnosis not present

## 2020-05-22 DIAGNOSIS — L720 Epidermal cyst: Secondary | ICD-10-CM | POA: Diagnosis not present

## 2020-05-22 DIAGNOSIS — L570 Actinic keratosis: Secondary | ICD-10-CM | POA: Diagnosis not present

## 2020-05-22 DIAGNOSIS — J479 Bronchiectasis, uncomplicated: Secondary | ICD-10-CM | POA: Diagnosis not present

## 2020-05-22 DIAGNOSIS — J471 Bronchiectasis with (acute) exacerbation: Secondary | ICD-10-CM | POA: Diagnosis not present

## 2020-05-22 DIAGNOSIS — G4733 Obstructive sleep apnea (adult) (pediatric): Secondary | ICD-10-CM | POA: Diagnosis not present

## 2020-05-22 DIAGNOSIS — Z85828 Personal history of other malignant neoplasm of skin: Secondary | ICD-10-CM | POA: Diagnosis not present

## 2020-05-31 DIAGNOSIS — J479 Bronchiectasis, uncomplicated: Secondary | ICD-10-CM | POA: Diagnosis not present

## 2020-05-31 DIAGNOSIS — J471 Bronchiectasis with (acute) exacerbation: Secondary | ICD-10-CM | POA: Diagnosis not present

## 2020-05-31 DIAGNOSIS — G4733 Obstructive sleep apnea (adult) (pediatric): Secondary | ICD-10-CM | POA: Diagnosis not present

## 2020-05-31 MED ORDER — DORNASE ALFA 1 MG/ML SOLUTION FOR INHALATION
Freq: Every day | RESPIRATORY_TRACT | 3 refills | 90 days | Status: CP
Start: 2020-05-31 — End: 2021-05-31

## 2020-05-31 NOTE — Unmapped (Signed)
Reviewed request for [facility]. Patient should have refills remaining.       Outpatient Medication Detail     Disp Refills Start End    dornase alfa (PULMOZYME) 1 mg/mL nebulizer solution 225 mL 3 05/10/2020 05/10/2021    Sig - Route: Inhale 1 ampule (2.5 mg) daily. Use at least 30-60 minutes before airway clearance, or after airway clearance - Inhalation    Sent to pharmacy as: dornase alfa 1 mg/mL solution for inhalation (PULMOZYME)    Notes to Pharmacy: For questions, please contact nurse [phone]. For PAs, please fax to [phone].          Will reply to his message via MyChart.

## 2020-05-31 NOTE — Unmapped (Signed)
Rerouting to [facility] so he an obtain via [facility].

## 2020-06-03 DIAGNOSIS — M810 Age-related osteoporosis without current pathological fracture: Secondary | ICD-10-CM | POA: Diagnosis not present

## 2020-06-03 DIAGNOSIS — M8589 Other specified disorders of bone density and structure, multiple sites: Secondary | ICD-10-CM | POA: Diagnosis not present

## 2020-06-05 NOTE — Unmapped (Signed)
[facility] Clinical Assessment & Refill Coordination Note    JAYDRIEN WASSENAAR, DOB: April 30, 1955  Phone: [phone] (home)     All above HIPAA information was verified with patient.     Was a Nurse, learning disability used for this call? No    Specialty Medication(s):   CF/Pulmonary: -Generic inhaled tobramycin 300mg /53mL inhalation solution  -Nebulized colistin (150mg  vials) and supply kit     Current Outpatient Medications   Medication Sig Dispense Refill   ??? albuterol 2.5 mg /3 mL (0.083 %) nebulizer solution Inhale 3 mL (2.5 mg total) by nebulization Two (2) times a day. 540 mL 3   ??? albuterol HFA 90 mcg/actuation inhaler Inhale 2 puffs Four (4) times a day. 25.5 g 3   ??? alcohol swabs (ALCOHOL PREP PADS) PadM Use as directed with inhaled antibiotics 100 each 5   ??? ascorbic acid, vitamin C, (VITAMIN C) 1000 MG tablet Take 1,000 mg by mouth daily.     ??? azithromycin (ZITHROMAX) 250 MG tablet Take 1 tablet (250 mg total) by mouth daily. 90 tablet 3   ??? colistimethate (COLYMYCIN) 150 mg injection Inject 2mL sterile water for injection to mix colistin vial, then draw up 2mL (150mg ) and inhale 2 times a day,28 days on and 28 days off. 60 each 5   ??? dornase alfa (PULMOZYME) 1 mg/mL nebulizer solution Inhale 1 ampule (2.5 mg) daily. Use at least 30-60 minutes before airway clearance, or after airway clearance 225 mL 3   ??? empty container Misc USE AS DIRECTED 1 each 2   ??? ferrous sulfate (IRON, FERROUS SULFATE,) 325 (65 FE) MG tablet Take 325 mg by mouth Two (2) times a day. BID     ??? HYDROcodone-acetaminophen (NORCO) 7.5-325 mg per tablet Take 1 tablet by mouth three (3) times a day (at 6am, noon and 6pm).     ??? HYDROcodone-homatropine (HYCODAN) 5-1.5 mg/5 mL syrup Take 5 mL by mouth every four (4) hours as needed for cough. 120 mL 0   ??? montelukast (SINGULAIR) 10 mg tablet Take 1 tablet (10 mg total) by mouth nightly. 90 tablet 3   ??? multivitamin-minerals-lutein Tab Take 1 tablet by mouth daily.      ??? nebulizers (LC PLUS) Misc use with Colistin 1 each 5   ??? nebulizers (LC PLUS) Misc Use as directed with hypertonic saline 10% 3 each 3   ??? needle, disp, 21 G (BD REGULAR BEVEL NEEDLES) 21 gauge x 1 1/2 Ndle Use as directed with inhaled Colistin 100 each 3   ??? omeprazole (PRILOSEC) 20 MG capsule Take 20 mg by mouth Two (2) times a day.      ??? sodium chloride (BD POSIFLUSH NORMAL SALINE 0.9) 0.9 % injection Inject 2mL of 0.9%NaCl into colistin vial & gently mix. After withdrawing colistin dose, add an additional 1mL of 0.9%NaCl to neb cup with the colistin dose. 180 mL 11   ??? sodium chloride 10 % Nebu Inhale 5 mL by nebulization Two (2) times a day. 900 mL 3   ??? sterile water Soln Use 2mL to mix Colistin,then add additional 2mL to neb cup with 2mL of mixed colistin.Inhale twice daily 28 days on and 28 days off. 600 mL 5   ??? SYMBICORT 160-4.5 mcg/actuation inhaler INHALE 2 PUFFS INTO THE LUNGS 2 TIMES A DAY. 30.6 g 1   ??? syringe with needle (BD LUER-LOK SYRINGE) 3 mL 20 gauge x 1 1/2 Syrg For use with inhaled antibiotic (Colistin) 60 each  30   ??? syringe with needle (BD LUER-LOK SYRINGE) 3 mL 20 gauge x 1 Syrg For use with inhaled antibiotic (Colistin) 60 each 5   ??? tiotropium bromide (SPIRIVA RESPIMAT) 2.5 mcg/actuation inhalation mist Inhale 2 puffs daily. 12 g 3   ??? tobramycin, PF, ([name]) 300 mg/5 mL nebulizer solution Inhale 5 mL (300 mg total) by nebulization every twelve (12) hours. 28 days on and 28 days off. 280 mL 5     No current facility-administered medications for this visit.        Changes to medications: Navjot reports no changes at this time.    Allergies   Allergen Reactions   ??? Gentamicin Other (See Comments)     BALANCE ISSUES   ??? Tobramycin Other (See Comments)     ototoxicity   ??? Morphine Nausea And Vomiting       Changes to allergies: No    SPECIALTY MEDICATION ADHERENCE     Tobramycin 300/5 mg/ml: 0 days of medicine on hand , will start April 1      Medication Adherence    Patient reported X missed doses in the last month: 0  Specialty Medication: Tobramycin 300mg /12ml  Patient is on additional specialty medications: Yes  Additional Specialty Medications: Colistin 150mg    Patient Reported Additional Medication X Missed Doses in the Last Month: 0  Patient is on more than two specialty medications: No  Support network for adherence: family member          Specialty medication(s) dose(s) confirmed: Regimen is correct and unchanged.     Are there any concerns with adherence? No    Adherence counseling provided? Not needed    CLINICAL MANAGEMENT AND INTERVENTION      Clinical Benefit Assessment:    Do you feel the medicine is effective or helping your condition? Yes    Clinical Benefit counseling provided? Progress note from 2/18 shows evidence of clinical benefit    Adverse Effects Assessment:    Are you experiencing any side effects? No    Are you experiencing difficulty administering your medicine? No    Quality of Life Assessment:    How many days over the past month did your bronchiectasis  keep you from your normal activities? For example, brushing your teeth or getting up in the morning. Patient declined to answer    Have you discussed this with your provider? Yes    Therapy Appropriateness:    Is therapy appropriate? Yes, therapy is appropriate and should be continued    DISEASE/MEDICATION-SPECIFIC INFORMATION      N/A    PATIENT SPECIFIC NEEDS     - Does the patient have any physical, cognitive, or cultural barriers? No    - Is the patient high risk? No    - Does the patient require a Care Management Plan? No     - Does the patient require physician intervention or other additional services (i.e. nutrition, smoking cessation, social work)? No      SHIPPING     Specialty Medication(s) to be Shipped:   CF/Pulmonary: -Generic inhaled tobramycin 300mg /64mL inhalation solution    Other medication(s) to be shipped: [employer]     Changes to insurance: No    Delivery Scheduled: Yes, Expected medication delivery date: 3/23.     Medication will be delivered via UPS to the confirmed prescription address in [facility].    The patient will receive a drug information handout for each medication shipped and additional FDA Medication  Guides as required.  Verified that patient has previously received a Conservation officer, historic buildings.    All of the patient's questions and concerns have been addressed.    Julianne Rice   [facility] Specialty Pharmacist

## 2020-06-11 DIAGNOSIS — Z7682 Awaiting organ transplant status: Secondary | ICD-10-CM | POA: Diagnosis not present

## 2020-06-11 DIAGNOSIS — Z23 Encounter for immunization: Secondary | ICD-10-CM | POA: Diagnosis not present

## 2020-06-11 MED ORDER — LC PLUS MISC
3 refills | 0 days | Status: CN
Start: 2020-06-11 — End: ?

## 2020-06-11 MED FILL — LC PLUS MISC: 30 days supply | Qty: 1 | Fill #0

## 2020-06-11 MED FILL — TOBRAMYCIN 300 MG/5 ML IN 0.225 % SODIUM CHLORIDE FOR NEBULIZATION: RESPIRATORY_TRACT | 56 days supply | Qty: 280 | Fill #2

## 2020-06-21 DIAGNOSIS — J9610 Chronic respiratory failure, unspecified whether with hypoxia or hypercapnia: Secondary | ICD-10-CM | POA: Diagnosis not present

## 2020-06-21 DIAGNOSIS — R0600 Dyspnea, unspecified: Secondary | ICD-10-CM | POA: Diagnosis not present

## 2020-06-21 DIAGNOSIS — R0602 Shortness of breath: Secondary | ICD-10-CM | POA: Diagnosis not present

## 2020-06-21 DIAGNOSIS — J479 Bronchiectasis, uncomplicated: Secondary | ICD-10-CM | POA: Diagnosis not present

## 2020-06-22 DIAGNOSIS — J471 Bronchiectasis with (acute) exacerbation: Secondary | ICD-10-CM | POA: Diagnosis not present

## 2020-06-22 DIAGNOSIS — J479 Bronchiectasis, uncomplicated: Secondary | ICD-10-CM | POA: Diagnosis not present

## 2020-06-22 DIAGNOSIS — G4733 Obstructive sleep apnea (adult) (pediatric): Secondary | ICD-10-CM | POA: Diagnosis not present

## 2020-06-24 DIAGNOSIS — J9610 Chronic respiratory failure, unspecified whether with hypoxia or hypercapnia: Secondary | ICD-10-CM | POA: Diagnosis not present

## 2020-06-24 DIAGNOSIS — R0602 Shortness of breath: Secondary | ICD-10-CM | POA: Diagnosis not present

## 2020-06-24 DIAGNOSIS — R0600 Dyspnea, unspecified: Secondary | ICD-10-CM | POA: Diagnosis not present

## 2020-06-24 DIAGNOSIS — J479 Bronchiectasis, uncomplicated: Secondary | ICD-10-CM | POA: Diagnosis not present

## 2020-06-26 DIAGNOSIS — Z79899 Other long term (current) drug therapy: Secondary | ICD-10-CM | POA: Diagnosis not present

## 2020-06-26 DIAGNOSIS — Z9889 Other specified postprocedural states: Secondary | ICD-10-CM | POA: Diagnosis not present

## 2020-06-26 DIAGNOSIS — J9610 Chronic respiratory failure, unspecified whether with hypoxia or hypercapnia: Secondary | ICD-10-CM | POA: Diagnosis not present

## 2020-06-26 DIAGNOSIS — E559 Vitamin D deficiency, unspecified: Secondary | ICD-10-CM | POA: Diagnosis not present

## 2020-06-26 DIAGNOSIS — M810 Age-related osteoporosis without current pathological fracture: Secondary | ICD-10-CM | POA: Diagnosis not present

## 2020-06-26 DIAGNOSIS — K219 Gastro-esophageal reflux disease without esophagitis: Secondary | ICD-10-CM | POA: Diagnosis not present

## 2020-06-26 DIAGNOSIS — R0600 Dyspnea, unspecified: Secondary | ICD-10-CM | POA: Diagnosis not present

## 2020-06-26 DIAGNOSIS — J479 Bronchiectasis, uncomplicated: Secondary | ICD-10-CM | POA: Diagnosis not present

## 2020-06-26 DIAGNOSIS — R0602 Shortness of breath: Secondary | ICD-10-CM | POA: Diagnosis not present

## 2020-06-26 DIAGNOSIS — M818 Other osteoporosis without current pathological fracture: Secondary | ICD-10-CM | POA: Diagnosis not present

## 2020-06-26 DIAGNOSIS — Z7952 Long term (current) use of systemic steroids: Secondary | ICD-10-CM | POA: Diagnosis not present

## 2020-06-26 DIAGNOSIS — J9611 Chronic respiratory failure with hypoxia: Secondary | ICD-10-CM | POA: Diagnosis not present

## 2020-06-28 DIAGNOSIS — R0600 Dyspnea, unspecified: Secondary | ICD-10-CM | POA: Diagnosis not present

## 2020-06-28 DIAGNOSIS — R0602 Shortness of breath: Secondary | ICD-10-CM | POA: Diagnosis not present

## 2020-06-28 DIAGNOSIS — J9610 Chronic respiratory failure, unspecified whether with hypoxia or hypercapnia: Secondary | ICD-10-CM | POA: Diagnosis not present

## 2020-06-28 DIAGNOSIS — J479 Bronchiectasis, uncomplicated: Secondary | ICD-10-CM | POA: Diagnosis not present

## 2020-06-28 DIAGNOSIS — A498 Other bacterial infections of unspecified site: Principal | ICD-10-CM

## 2020-06-28 MED ORDER — COLISTIN (COLISTIMETHATE SODIUM) 150 MG SOLUTION FOR INJECTION
5 refills | 0 days | Status: CP
Start: 2020-06-28 — End: ?
  Filled 2020-07-18: qty 60, 30d supply, fill #0

## 2020-07-01 DIAGNOSIS — J479 Bronchiectasis, uncomplicated: Secondary | ICD-10-CM | POA: Diagnosis not present

## 2020-07-01 DIAGNOSIS — R0600 Dyspnea, unspecified: Secondary | ICD-10-CM | POA: Diagnosis not present

## 2020-07-01 DIAGNOSIS — J471 Bronchiectasis with (acute) exacerbation: Secondary | ICD-10-CM | POA: Diagnosis not present

## 2020-07-01 DIAGNOSIS — J9610 Chronic respiratory failure, unspecified whether with hypoxia or hypercapnia: Secondary | ICD-10-CM | POA: Diagnosis not present

## 2020-07-01 DIAGNOSIS — R0602 Shortness of breath: Secondary | ICD-10-CM | POA: Diagnosis not present

## 2020-07-01 DIAGNOSIS — G4733 Obstructive sleep apnea (adult) (pediatric): Secondary | ICD-10-CM | POA: Diagnosis not present

## 2020-07-01 NOTE — Unmapped (Signed)
Outpatient Palliative Care Consult Note    Reason for Consult Request: Goals of Care / Decision Making, Patient and Family Support and advance care planning  Primary Care Provider:  Barbaraann Boys, MD  Referring physician: Dr. Rolm Baptise    Assessment: Jordan [name] is a 65 y.o. male with bronchiectasis with chronic pseudomonas colonization and chronic hypoxemic respiratory failure requiring supplemental O2 who presents to palliative care for patient support, advance care planning and decision-making.       Plan:     1. Dyspnea/cough  - Currently on 2L [place], remains diligent with pulmonary clearance regimen  - Finishes pulmonary rehab at the end of June/early July --> will follow-up with our team in palliative care in July to see if a trial of medication is warranted.     2. Fatigue  - Finishes pulmonary rehab at the end of June/early July --> will follow-up with our team in palliative care in July to see if a trial of medication is warranted.     3. Chronic back pain  - On Norco 7.5-325 for many years (stable dose, uses ~3x/day)    4. Goals of care:  - Understanding of medical condition and prognosis: recognizes he has steadily decreasing PFTs though clinically has been relatively stable. Not a transplant candidate. He has previously discussed with Dr. Garner Nash re: prognosis and was told a range of 5-10 years --> felt encouraged by this.   - Goals and Priorities:     -- Get off O2    -- Reduce # of breathing treatments and amount of time these take (currently 4 hours), get off some meds    -- To be more active, travel with his wife    -- Ultimately hoping that therapy + future technology will give him many more years (and extend the life of his lungs)    -- Wants to see the milestones in grandkids' lives  - Worries: doesn't worry too much but shares that his biggest concerns include the cost of his meds/breathing treaments (pulmozyme, colistin/tobra) and how these have increased since he changed insurances.    4. Advance Care Planning  - Surrogate Decision-maker and/or Health care agent:   HCDM (HCPOA): Jordan, [name] - [phone] - updated in chart  - Advance Directives: Provided [place] [facility] to patient today. He does have a living will and HCPOA paperwork and will bring to his next visit.  - Code status: not discussed at this visit, can address in the future.  - Treatment decisions: he is interested to hear from Dr. Garner Nash about whether or not he should look into transplant at [facility]. He is at peace with this being off the table if she doesn't think its worth his time.     5. Coping/Support:  - Feels at peace with his current situation, hopeful that rehab will provide him with improvements in dyspnea/cough/fatigue. Hopeful for life prolongation.   - Feels like things are in God's hands. 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 Care coordination  - Patient discussed with Dr. Garner Nash in clinic    F/u: 3 months (with Dr. Molli Hazard in internal medicine)    ---------------------------------------    HPI:   Jordan [name] is a 65 y.o. male with history of bronchiectasis s/p RML and RLL 2/2 abscess with chronic pseudomonas colonization and chronic hypoxemic respiratory failure requiring supplemental O2 presenting to palliative  care clinic for patient support and decision-making.     Visit today with patient and his wife Jordan [name].    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 He has had lung issues for many years and had a repeat lung resection in 2012. Followed with a local pulmonologist until 2014 when he was transferred to [facility] because he was going downhill quickly and needed to be hospitalized. This was the first time he heard the term bronchiectasis. He has been following with Dr. Garner Nash since 2014 and last saw her on 05/10/20. Diagnosed with local prostate cancer a few years ago and is s/p prostatectomy. Overall his lung disease has been steadily declining over the last several years, now on 2L O2. Underwent transplant evaluation recently at [facility] however deemed not to be a candidate due to prior lung resections. Got second opinion at [facility] for evaluation however they too did not feel he was a candidate.   - Reports that he was shocked to hear [facility] say he needed a transplant but that it would kill him --> didn't realize the severity of the situation.   - At peace with the fact that transplant is not an option. Feels it is in God's hands. Also understands that lung transplant comes with many risks and has the poorest success rate of all transplants and that if successful, they don't last forever.     States he recently started and at home pulmonary rehab program- has had 4 sessions so far and he is cautiously optimistic this will provide him with relief. He has learned a lot and says that the respiratory therapist has made a lot of promises so hoping this will help. Hopeful he will get off oxygen therapy so that he can have more freedoms in term of spending time with his family and traveling.     Symptom Review:  Pain: history of chronic back pain and multiple prior spinal surgeries, has been on Norco (7.5-325) for many years (uses ~3x/day). Pain level usually 2-3.  Fatigue: most burdensome symptom-sometimes falls asleep during the day. More energy during the day not great though.   Mobility: slow. SOB is a large part of it.  Sleep: no issues  Appetite: normal, no weight loss (gained 6-7lbs)  Nausea: once a month. Sometimes has emesis after coughing.   Bowel function: denies  Dyspnea: shortness of breath with activity- few steps and he is SOB, uses oxygen with ambulation and sleep (started last Sept) - uses 2L. Thinks extra weight makes it worse.    Cough: sometimes has bad coughing fits and has post-tussive emesis. Doesn't have significant coughing fits every day (major ones once a week).  Secretions: lots of sputum production with breathing treatments.   Mood: aggrevated/frustrated by breathing treatments. Pushes himself to do them. Denies depression/anxiety.    REVIEW OF SYSTEMS:  A comprehensive review of 10 systems was negative except for pertinent positives noted in HPI.    Past Medical History:   Patient Active Problem List   Diagnosis   ??? Bronchiectasis (CMS-HCC)   ??? Esophagitis   ??? Pseudomonas aeruginosa infection   ??? Daytime somnolence   ??? Memory loss   ??? Hypersomnia with sleep apnea   ??? Essential hypertension   ??? Osteoarthrosis   ??? Dizziness   ??? Excessive daytime sleepiness   ??? Sleep disturbances   ??? Loculated pleural effusion   ??? Moderate  Pharyngeal dysphagia   ??? IDA (iron deficiency anemia)   ??? Disequilibrium   ??? Gastroesophageal reflux disease without esophagitis   ??? Hoarseness   ??? Dyspnea on exertion   ??? Chronic respiratory failure with hypoxia (CMS-HCC)     Past Medical History:   Diagnosis Date   ??? Abscess of lung (CMS-HCC) 11/06/2010    CT Chest 10/22/10 12/05/2010 right thoracotomy resection of right middle lobe and resection of right lower lobe abscess    ??? Arthritis     back   ??? Biceps tendon tear 2013    left side   ??? Bronchiectasis (CMS-HCC)     chronic psuedomonas infection   ??? Degenerative joint disease of left acromioclavicular joint    ??? GERD (gastroesophageal reflux disease)    ??? Hypertension    ??? Obstructive sleep apnea on CPAP 03/14/13    AHI 33.5, on BiPAP 12/8 based on sleep study 09/2013   ??? Pneumonia 2012    Lung abcess    ??? PONV (postoperative nausea and vomiting)    ??? Prostate cancer (CMS-HCC) 09/09/2017   ??? Rotator cuff injury left   ??? Vertigo      Surgical History:    Past Surgical History:   Procedure Laterality Date   ??? APPENDECTOMY  1969   ??? BACK SURGERY     ??? BRONCHOSCOPY  10/19/2010    [facility]   ??? CARPAL TUNNEL RELEASE  1999   ??? LUNG REMOVAL, PARTIAL Right 10/2010    RML and RLL partially resected   ??? MENISCECTOMY  2011   ??? NECK SURGERY  1993, 1999, 2002   ??? PR GERD TST W/ MUCOS IMPEDE ELECTROD,>1HR N/A 07/06/2012    Procedure: ESOPHAGEAL FUNCTION TEST, GASTROESOPHAGEAL REFLUX TEST W/ NASAL CATHETER INTRALUMINAL IMPEDANCE ELECTRODE(S) PLACEMENT, RECORDING, ANALYSIS AND INTERPRETATION; PROLONGED;  Surgeon: None None;  Location: [facility];  Service: Gastroenterology   ??? PR OPEN TREAT RIB FRACTURE W/INT FIXATION, UNILATERAL, 1-2 RIBS Right 03/19/2015    Procedure: OPEN TREATMENT OF RIB FRACTURE REQUIRING INTERNAL FIXATION, UNILATERAL; 1-2 RIBS;  Surgeon: Evert Kohl, MD;  Location: MAIN OR [facility];  Service: Cardiothoracic   ??? PR THORACOTOMY W/THERAP WEDGE RESEXN ADDL IPSILATRL Right 08/07/2013    Procedure: THORACOTOMY; WITH THERAPEUTIC LOBECTOMY OF RIGHT MIDDLE AND RIGHT LOWER LOBE RESECTION , EACH ADDITIONAL RESECTION, IPSILATERAL;  Surgeon: Alvester Chou, MD;  Location: MAIN OR [facility];  Service: Cardiothoracic   ??? TONSILLECTOMY  1965     Personal and Social History: Lives in [place] with wife. He has 2 children (son/daughter) and 5 grandchildren (13-7), all who live closeby. Previously worked as an Personnel officer, currently on disability. Enjoys spending time with grandkids, traveling. Wife retired in Sept.     Advance Care Planning:   HCPOA:   HCDM (HCPOA): Lennix, Kneisel - [phone]  Natural surrogate decision maker: spouse  Living Will: have them, not in chart  -- Have nearly planned their funerals  ACP note: none on file    Family History:  Cancer-related family history is not on file.  He indicated that his mother is deceased. He indicated that his father is deceased. He indicated that both of his brothers are alive. He indicated that the status of his daughter is unknown. He indicated that the status of his son is unknown. He indicated that the status of his neg hx is unknown.    Medications prior to visit:   Current Outpatient Medications   Medication Instructions   ???  albuterol HFA 90 mcg/actuation inhaler 2 puffs, Inhalation, 4 times a day   ??? albuterol 2.5 mg, Nebulization, 2 times a day (standard)   ??? alcohol swabs (ALCOHOL PREP PADS) PadM Use as directed with inhaled antibiotics   ??? ascorbic acid (vitamin C) (VITAMIN C) 1,000 mg, Oral, Daily (standard)   ??? azithromycin (ZITHROMAX) 250 mg, Oral, Daily (standard)   ??? colistimethate (COLYMYCIN) 150 mg injection Inject 2 mL sterile water for injection to mix colistin vial, then draw up 2 mL (150mg ) and inhale 2 times a day, 28 days on and 28 days off.   ??? dornase alfa (PULMOZYME) 1 mg/mL nebulizer solution Inhale 1 ampule (2.5 mg) daily. Use at least 30-60 minutes before airway clearance, or after airway clearance   ??? empty container Misc USE AS DIRECTED   ??? ferrous sulfate (IRON (FERROUS SULFATE)) 325 mg, Oral, 2 times a day, BID   ??? HYDROcodone-acetaminophen (NORCO) 7.5-325 mg per tablet 1 tablet, Oral, 3 times a day   ??? HYDROcodone-homatropine (HYCODAN) 5-1.5 mg/5 mL syrup 5 mL, Oral, Every 4 hours PRN   ??? montelukast (SINGULAIR) 10 mg, Oral, Nightly   ??? multivitamin-minerals-lutein Tab 1 tablet, Oral, Daily (standard)   ??? nebulizers (LC PLUS) Misc use with Colistin   ??? nebulizers (LC PLUS) Misc Use as directed with hypertonic saline 10%   ??? nebulizers Misc Use as directed with tobramycin   ??? needle, disp, 21 G (BD REGULAR BEVEL NEEDLES) 21 gauge x 1 1/2 Ndle Use as directed with inhaled Colistin   ??? omeprazole (PRILOSEC) 20 mg, Oral, 2 times a day   ??? sodium chloride (BD POSIFLUSH NORMAL SALINE 0.9) 0.9 % injection Inject 2mL of 0.9%NaCl into colistin vial & gently mix. After withdrawing colistin dose, add an additional 1mL of 0.9%NaCl to neb cup with the colistin dose.   ??? sodium chloride 10 % Nebu 5 mL, Nebulization, 2 times a day (standard)   ??? sterile water Soln Use 2mL to mix Colistin,then add additional 2mL to neb cup with 2mL of mixed colistin.Inhale twice daily 28 days on and 28 days off.   ??? SYMBICORT 160-4.5 mcg/actuation inhaler INHALE 2 PUFFS INTO THE LUNGS 2 TIMES A DAY.   ??? syringe with needle (BD LUER-LOK SYRINGE) 3 mL 20 gauge x 1 1/2 Syrg For use with inhaled antibiotic (Colistin)   ??? syringe with needle (BD LUER-LOK SYRINGE) 3 mL 20 gauge x 1 Syrg For use with inhaled antibiotic (Colistin)   ??? tiotropium bromide (SPIRIVA RESPIMAT) 2.5 mcg/actuation inhalation mist 2 puffs, Inhalation, Daily (standard)   ??? tobramycin, PF, ([name]) 300 mg/5 mL nebulizer solution 300 mg, Nebulization, Every 12 hours, 28 days on and 28 days off.     Allergies:   Allergies   Allergen Reactions   ??? Gentamicin Other (See Comments)     BALANCE ISSUES   ??? Tobramycin Other (See Comments)     ototoxicity   ??? Morphine Nausea And Vomiting     Palliative Performance Scale: 60% - Ambulation: Reduced / unable to do hobby or some housework, significant disease / Self-Care:Occasional assist as necessary / Intake:Normal or reduced / Level of Conscious: Full or confusion    PHYSICAL EXAM:   Vital signs for this encounter: BP 134/66 (BP Site: L Arm, BP Position: Sitting, BP Cuff Size: Medium)  - Pulse 91  - Temp 36.3 ??C (97.3 ??F) (Temporal)  - Wt 83.9 kg (185 lb)  - SpO2 93%  - BMI 27.32 kg/m??   Well  appearing white male in no acute distress, pleasant affect. Non-anxious appearing. Easy work of breathing on room air. Speaking in full sentences.     IMAGING:    CXR (05/02/20): Images not available for review. Per report, Visualized cardiac and mediastinal contours within normal limits allowing for mediastinal shift to the right. Circumferential right pleural fluid/thickening. Volume loss in the right lung consistent with known right lung resection. Irregular opacities throughout both lungs, right greater than left, consistent with known bronchiectasis.   ??  Chest CT (11/10/19): Images personally reviewed. Stable sequelae of right middle lobectomy and right lower lobe wedge resection. Stable consolidation in the medial posterior right upper lobe and right lung apex, bilateral diffuse extensive bronchiectasis and bronchial wall thickening and unchanged sequelae of chronic endobronchial infection.        I spent >50% of 95 minutes total patient care time on 07/02/2020 counseling the patient on the plan of care and coordinating care with Dr. Garner Nash.     Ulanda Edison, MD, [facility]  [facility] Palliative Care

## 2020-07-01 NOTE — Unmapped (Incomplete)
Will see about starting injectable therapy for asthma/allergies based on breathing testing.    Symptoms of a bronchiectasis exacerbation: 48 hours of at least three of the following:  ?? Increased cough  ?? Change in volume or appearance of sputum  ?? Increased sputum purulence  ?? Worsening shortness of breath and/or exercise tolerance  ?? Fatigue and/or malaise  ?? Coughing up blood (hemoptysis)    If you are experiencing some of these symptoms:  ?? Increase the frequency and/or intensity of your airway clearance  ?? Try to submit a sputum sample for bacterial and AFB cultures (at [facility] or locally)  ?? Reach out to me or your local physician.  ?? If you need antibiotics, recommend treating for 14 days.      Please bring any new airway clearance equipment to your next visit to ensure that you are using and caring for it properly.    Information about getting your COVID-19 vaccine or COVID-19 treatment: yourshot.org    Interested in participating in trials for a COVID-19 vaccine or treatments for COVID-19? https://www.coronaviruspreventionnetwork.org    Thank you for allowing me to be a part of your care. Please call the clinic with any questions.    Viona Gilmore, MD, MPH  Pulmonary and Critical Care Medicine  [address]  [id number]  [place], [place] [address]    Thank you for your visit to the [facility] Pulmonary Clinics. You may receive a survey from [facility] regarding your visit today, and we are eager to use this feedback to improve your experience. Thank you for taking the time to fill it out.    Between appointments, you can reach [place] at these numbers:  ??? For appointments: [phone]  ??? For my nurse, Arsenio Loader RN: [phone]  ??? Fax: [phone]  ??? For urgent issues after hours: Hospital Operator @ [phone] & ask for Pulmonary Fellow on call    For further information, check out the websites below:    [facility] for Bronchiectasis: [email or web address]    Impact Airway Clearance Education: http://www.impact-be.com    Videos from the patient session of the World Bronchiectasis Conference in [place] on October 03, 2016 can be watched here (TrustyNews.es).     Bronchiectasis Toolbox (information about airway clearance): DiningCalendar.de    Copy for Individuals with Bronchiectasis and/or NTM through the Bronchiectasis Registry and the COPD Foundation: https://www.mckee-young.org/    Information about Non-tuberculous Mycobacterial Infections:  https://www.ntminfo.org     Interested in clinical trials and other research opportunities?  www.clinicaltrials.gov    The Rare Diseases Clinical Research Network [facility]) Genetic Disorders of  Mucociliary Clearance Consortium [facility]) Contact Registry is a way for patients with disorders of mucociliary clearance (such as bronchiectasis) and their family members to learn about research studies they may be able to join. Participation is completely voluntary and you may choose to withdraw at any time. There is no cost to join the [employer]. For more information or to join the registry please go to the following website:  [email or web address]

## 2020-07-01 NOTE — Unmapped (Signed)
[facility]    Assessment:      Patient:Jordan [name] (Dec 18, 1955)    Jordan [name] is a 65 y.o. male who is seen for follow up of bronchiectasis with chronic Pseudomonas colonization and chronic hypoxemic respiratory failure requiring supplemental oxygen with exertion and sleep. Overall stable since I last saw him. Unfortunately, not a candidate for lung transplantation due to prior thoracic surgeries. Persistent elevation of eosinophils to 0.2-0.3 with some improvement after albuterol.  ENT note from 5 years ago notes no polyps.  Unclear if he would benefit from asthma biologic.     Plan:      Problem List Items Addressed This Visit        Respiratory    Bronchiectasis (CMS-HCC) - Primary    Relevant Orders    Lower Respiratory Culture    AFB culture    AFB SMEAR (Completed)    Chronic respiratory failure with hypoxia (CMS-HCC)       Other    Pseudomonas aeruginosa infection    Relevant Orders    Lower Respiratory Culture      Therapies:  ?? Patient did not need to meet with the respiratory therapist today.  ?? No changes were made to airway clearance regimen nor inhaled therapies.  ?? No change to oral therapies.   ?? Will see how he does with telehealth pulmonary rehab before attempting to obtain asthma biologic agent.    Testing:  ??? Patient spontaneously expectorated sputum that was sent for bacterial and AFB cultures. These results will help to guide antibiotic therapy in the future during exacerbations.  ??? If he has future exacerbations, sputum should be sent for bacterial and AFB cultures. The bacterial culture should be processed like a cystic fibrosis sample given the overlapping pathogens. There are standing orders for these cultures in our system and locally.    Referrals:  ??? None. Seeing Dr. Freida Busman in palliative care after this visit.     Vaccinations:  ??? COVID-19: UTD  ??? Flu: UTD  ??? Pneumococcal: UTD  ??? Tdap: UTD    Alrick will return to clinic in 3 months for follow-up with pre-bronchodilator spirometry and sputum cultures.  He will call or send me a message via MyChart if questions or concerns arise before this visit. Nolton is in agreement with the above plan.    I personally spent 40 minutes face-to-face and non-face-to-face in the care of this patient, which includes all pre, intra, and post visit time on the date of service.     Subjective:      HPI: Jordan [name] is a 65 y.o. male who is seen for follow up of bronchiectasis with PsA colonization and prior history of multiple lung resections.    09/27/18:  I last saw him on 05/17/18. He reached out at the end of May reporting symptoms c/w an exacerbation. I treated him with a two week course of Ciprofloxacin, which seemed to help.  Reports that he feels winded too much - if walks up incline or up a couple hundred feet. This has worsened over the past year. Has to stop and let lungs catch up.  Of note, he has been off testosterone supplementation for the past year after he was diagnosed with prostate cancer.    He received the [facility], which he is using with nebulized HTS 7% and Pulmozyme. After using the Vest for 6-8 weeks, he noticed improvement with more phlegm mobilization. Sometimes has to pause it to  cough to get stuff up.  Plugging feeling happening a lot more, especially with the vest.  Most of the time, sputum is thinned out with Pulmozyme but still gets those plugs.    Had to stop eating ice cream or drinking milkshake - will cough will eating and that makes him vomit on occasion. Past few weeks, when eating will cough, no matter what he eats.  Happens almost once a day. Doesn't feel like things get stuck typically.      02/07/19:  Following his last visit, Rendon reached out regarding portable oxygen.  We had considered the life 2000 but he does not qualify because he does not use supplemental oxygen.  He performed the 6-minute walk test prior to today's visit but did not desaturate.  He wonders if this is because the test was done in the morning and he doesn't have breathing issues in the morning.  Typically, breathing issues occur in afternoon.  During these episodes, he will have to use his inhaler and feels like he is suffocating.  He describes it as an uncomfortable feeling that limits his ability to do things with family.  If he is having trouble catching breath, oximetry is 92-93%. Has seen it in the 80s if comes in from outside and is short of breath.    For airway clearance, he is nebulizing 10% hypertonic saline and using his percussive vest for 20 minute sessions in AM and PM. Has to pause it to cough and get stuff up.  Coughing up more stuff since last visit and is waking up 1-2 times a night with cough.having coughing fits between clearance session.  What he expectorates is less of a mucous plug and mostly stringy sputum that is yellowish to light brown in color.  No hemoptysis.  On Saturday, he had a coughing fit that nearly triggered posttussive emesis.  He only expectorated about 3-4 globs of phlegm.  Nebulizing Pulmozyme daily.  Alternates [name] and colistin every other month.  On [name] this month and will start Colistin on Dec 1.    06/06/19:  Coughing more and more sputum.  Waking up 1-2 times per night coughing. Can cough once or just keep coughing.  Sputum is yellow in color without evidence of blood.  He is not expectorating green sputum currently. On colistin this month; alternates with [name]. Went through January off all inhaled antibiotics.  Oxygen levels in the low 90s.  Uses rescue inhaler if goes up stairs.  Regularly performing airway clearance with nebulized 10% hypertonic saline twice a day, percussive vest twice a day, and Pulmozyme daily. He last received IV antibiotics in January 2019 but has been treated with oral antibiotics on 04/19/18 and 08/17/18, 03/10/19.      11/10/19:  1-2 times per week can get sharp right sided chest pain - even with nipple line. Can happen with clearance or activity or rest. Never during sleep. Following his hospitalization, he first experienced it in June. Lasts only about 10 seconds. Not reproducible. Limited activity due to dyspnea. Regularly performing airway clearance with nebulized 10% hypertonic saline twice a day, percussive vest twice a day, and Pulmozyme daily. Remains on chronic azithromycin therapy.    02/13/20:  Showed up to [facility] with oxygen saturations of 87-88% after walking from parking lot (<100 yd) and up incline without supplemental oxygen.  Using 2L continuous flow with exertion and sleep.  Had sleep study done at [facility] on RA and was told that he should  be fine with just the supplemental oxygen. I do not have this report. Using oxygen when goes to football game; needs long period to recover from activity.  Going up 20 foot incline with oxygen, will have to stop and catch breath.     Wheezing more lately and endorses chest tightness. Has need to use albuterol as rescue 3-4 times per day.  Used albuterol in the car on the way here. Typically would be able to cough up large amount of mucus after PFTs but only coughed up small amount. Feels like mucus is stuck in lungs despite twice daily airway clearance with 10% HTS and Vest. Also using Pulmozyme once a day and Symbicort twice a day after clearance.  Using colistin this month. He ordered the [name] nebs to use next month even though insurance won't cover it. Feels like inhaled antibiotics definitely help.    05/10/20:   Saw [facility] for lung transplant evaluation.  Felt to be poor candidate due to prior surgeries and primary disease.  Candidacy closed at [facility]. Referral sent to [facility] and [place] on 2/15.  Based on labs and testing done there, getting Hep B vaccination series (not Hep A or Hep B immune).  Also getting DEXA that was ordered by Dr. Abigail Miyamoto.    Had PSG done at [facility] - no evidence of OSA but does have increased upper airway resistance.  He never sleeps on his back; only sleeps on side or stomach.   Fatigue is most burdensome symptom.    Regrets that he let the surgeon operate on his lung before he met me.  He completed pulmonary rehab at [facility] but they only had him on machine for ~30 minutes and then stretching.  No strength exercises. He is planning on joining a gym based on recommendations from [facility] transplant.  Was surprised that he didn't desaturate during his walk test at [facility].    07/02/20:  At last visit, referred him to [facility] for pulmonary rehab. Found to have osteoporosis in hips. Turned down for transplant at [facility] based on chart review.  No change in breathing or cough.  Every once in a while, has bad coughing spells.  Same sputum volume and color. Globby to stringy. Not dark brown or bloody.  Sputum is light brown to yellowish.  Breathing about the same.  Started on 1200 mg calcium for osteoporosis.  Starting telehealth pulmonary rehab - feels optimistic that it will help him do more.    Past Medical History:   Diagnosis Date   ??? Abscess of lung (CMS-HCC) 11/06/2010    CT Chest 10/22/10 12/05/2010 right thoracotomy resection of right middle lobe and resection of right lower lobe abscess    ??? Arthritis     back   ??? Biceps tendon tear 2013    left side   ??? Bronchiectasis (CMS-HCC)     chronic psuedomonas infection   ??? Degenerative joint disease of left acromioclavicular joint    ??? GERD (gastroesophageal reflux disease)    ??? Hypertension    ??? Obstructive sleep apnea on CPAP 03/14/13    AHI 33.5, on BiPAP 12/8 based on sleep study 09/2013   ??? Pneumonia 2012    Lung abcess    ??? PONV (postoperative nausea and vomiting)    ??? Prostate cancer (CMS-HCC) 09/09/2017   ??? Rotator cuff injury left   ??? Vertigo        Past Surgical History:   Procedure Laterality Date   ???  APPENDECTOMY  1969   ??? BACK SURGERY     ??? BRONCHOSCOPY  10/19/2010    [facility]   ??? CARPAL TUNNEL RELEASE  1999   ??? LUNG REMOVAL, PARTIAL Right 10/2010    RML and RLL partially resected   ??? MENISCECTOMY 2011   ??? NECK SURGERY  1993, 1999, 2002   ??? PR GERD TST W/ MUCOS IMPEDE ELECTROD,>1HR N/A 07/06/2012    Procedure: ESOPHAGEAL FUNCTION TEST, GASTROESOPHAGEAL REFLUX TEST W/ NASAL CATHETER INTRALUMINAL IMPEDANCE ELECTRODE(S) PLACEMENT, RECORDING, ANALYSIS AND INTERPRETATION; PROLONGED;  Surgeon: None None;  [facility];  Service: Gastroenterology   ??? PR OPEN TREAT RIB FRACTURE W/INT FIXATION, UNILATERAL, 1-2 RIBS Right 03/19/2015    Procedure: OPEN TREATMENT OF RIB FRACTURE REQUIRING INTERNAL FIXATION, UNILATERAL; 1-2 RIBS;  Surgeon: Evert Kohl, MD;  Location: MAIN OR [facility];  Service: Cardiothoracic   ??? PR THORACOTOMY W/THERAP WEDGE RESEXN ADDL IPSILATRL Right 08/07/2013    Procedure: THORACOTOMY; WITH THERAPEUTIC LOBECTOMY OF RIGHT MIDDLE AND RIGHT LOWER LOBE RESECTION , EACH ADDITIONAL RESECTION, IPSILATERAL;  Surgeon: Alvester Chou, MD;  Location: MAIN OR [facility];  Service: Cardiothoracic   ??? TONSILLECTOMY  1965       Family History   Problem Relation Age of Onset   ??? Alzheimer's disease Mother    ??? Stroke Mother    ??? Bronchiectasis  Brother         presumed   ??? Bronchiectasis  Brother         presumed   ??? Liver disease Father    ??? Diabetes Father    ??? Heart failure Father    ??? Bronchiectasis  Daughter    ??? Asthma Son         childhood asthma   ??? Clotting disorder Neg Hx    ??? Anesthesia problems Neg Hx        Social History     Tobacco Use   ??? Smoking status: Never Smoker   ??? Smokeless tobacco: Never Used   Substance Use Topics   ??? Alcohol use: No     Alcohol/week: 0.0 standard drinks   ??? Drug use: No       Allergies  Reviewed on 07/02/2020      Reactions Comment    Gentamicin Other (See Comments) BALANCE ISSUES    Tobramycin Other (See Comments) ototoxicity    Morphine Nausea And Vomiting         Current Outpatient Medications   Medication Sig Dispense Refill   ??? albuterol 2.5 mg /3 mL (0.083 %) nebulizer solution Inhale 3 mL (2.5 mg total) by nebulization Two (2) times a day. 540 mL 3   ??? albuterol HFA 90 mcg/actuation inhaler Inhale 2 puffs Four (4) times a day. 25.5 g 3   ??? alcohol swabs (ALCOHOL PREP PADS) PadM Use as directed with inhaled antibiotics 100 each 5   ??? ascorbic acid, vitamin C, (VITAMIN C) 1000 MG tablet Take 1,000 mg by mouth daily.     ??? azithromycin (ZITHROMAX) 250 MG tablet Take 1 tablet (250 mg total) by mouth daily. 90 tablet 3   ??? cholecalciferol, vitamin D3-50 mcg, 2,000 unit,, 50 mcg (2,000 unit) cap Take 2,000 Units by mouth.     ??? coenzyme Q10 300 mg cap Take 10-300 mg by mouth.     ??? colistimethate (COLYMYCIN) 150 mg injection Inject 2 mL sterile water for injection to mix colistin vial, then draw up 2 mL (150mg ) and inhale 2  times a day, 28 days on and 28 days off. 60 each 5   ??? dornase alfa (PULMOZYME) 1 mg/mL nebulizer solution Inhale 1 ampule (2.5 mg) daily. Use at least 30-60 minutes before airway clearance, or after airway clearance 225 mL 3   ??? empty container Misc USE AS DIRECTED 1 each 2   ??? ferrous sulfate (IRON, FERROUS SULFATE,) 325 (65 FE) MG tablet Take 325 mg by mouth Two (2) times a day. BID     ??? HYDROcodone-acetaminophen (NORCO) 7.5-325 mg per tablet Take 1 tablet by mouth three (3) times a day (at 6am, noon and 6pm).     ??? HYDROcodone-homatropine (HYCODAN) 5-1.5 mg/5 mL syrup Take 5 mL by mouth every four (4) hours as needed for cough. 120 mL 0   ??? montelukast (SINGULAIR) 10 mg tablet Take 1 tablet (10 mg total) by mouth nightly. 90 tablet 3   ??? multivitamin-minerals-lutein Tab Take 1 tablet by mouth daily.      ??? nebulizers (LC PLUS) Misc use with Colistin 1 each 5   ??? nebulizers (LC PLUS) Misc Use as directed with hypertonic saline 10% 3 each 3   ??? nebulizers Misc Use as directed with tobramycin 1 each 5   ??? needle, disp, 21 G (BD REGULAR BEVEL NEEDLES) 21 gauge x 1 1/2 Ndle Use as directed with inhaled Colistin 100 each 3   ??? omeprazole (PRILOSEC) 20 MG capsule Take 20 mg by mouth Two (2) times a day.      ??? sodium chloride (BD POSIFLUSH NORMAL SALINE 0.9) 0.9 % injection Inject 2mL of 0.9%NaCl into colistin vial & gently mix. After withdrawing colistin dose, add an additional 1mL of 0.9%NaCl to neb cup with the colistin dose. 180 mL 11   ??? sodium chloride 10 % Nebu Inhale 5 mL by nebulization Two (2) times a day. 900 mL 3   ??? sterile water Soln Use 2mL to mix Colistin,then add additional 2mL to neb cup with 2mL of mixed colistin.Inhale twice daily 28 days on and 28 days off. 600 mL 5   ??? SYMBICORT 160-4.5 mcg/actuation inhaler INHALE 2 PUFFS INTO THE LUNGS 2 TIMES A DAY. 30.6 g 1   ??? syringe with needle (BD LUER-LOK SYRINGE) 3 mL 20 gauge x 1 1/2 Syrg For use with inhaled antibiotic (Colistin) 60 each 30   ??? syringe with needle (BD LUER-LOK SYRINGE) 3 mL 20 gauge x 1 Syrg For use with inhaled antibiotic (Colistin) 60 each 5   ??? tiotropium bromide (SPIRIVA RESPIMAT) 2.5 mcg/actuation inhalation mist Inhale 2 puffs daily. 12 g 3   ??? tobramycin, PF, ([name]) 300 mg/5 mL nebulizer solution Inhale 5 mL (300 mg total) by nebulization every twelve (12) hours. 28 days on and 28 days off. 280 mL 5   ??? zinc gluconate 50 mg (7 mg elemental zinc) tablet Take 50 mg by mouth.       No current facility-administered medications for this visit.     Physical Exam:   BP 134/66  - Pulse 91  - Temp 36.3 ??C (97.3 ??F) (Temporal)  - Resp 18  - Ht 175.3 cm (5' 9)  - Wt 83.9 kg (185 lb)  - SpO2 93%  - BMI 27.32 kg/m??   Well appearing white male in no acute distress, in good spirits. Easy work of breathing without accessory muscle use. Speaking in full sentences.  Crackles appreciated over left mid and  lower lung fields.  Decreased breath sounds over right mid and  lower lung fields, consistent with prior resection.  Regular rate and rhythm with normal S1 and S2.  No murmurs, rubs, or gallops.  Clubbing unchanged from prior.  No lower extremity edema.    Diagnostic Review:   The following data were reviewed during this visit with key findings summarized below:  Pulmonary Function Testing:      Spirometry consistent with severe airway obstruction and suggestive of moderate restriction.  FVC improved as compared to prior.   Improvement after bronchodilator does not meet criteria for reversibility.   FVC (% predicted) FEV1 (% predicted) FEV1/FVC   11/10/2013 3.19 L (68%) 2.16 L (61%)  68%   02/23/2014 3.50 L (76%)  2.23 L (64%)  64%   07/12/2014  2.94 L (66%)  2.02 L (58%)  69%   01/15/2015  2.82 L (62%)  1.79 L (52%)  63%   02/08/2015  3.04 L (66%)  2.06 L (59%)  68%   04/02/2015  2.86 L (63%)  1.83 L (53%)  64%   12/10/2015  2.77 L (62%)  1.62 L (48%)  58%   01/27/2016  2.42 L (54%)  1.55 L (46%)  64%   03/24/2016  3.36 L (75%)  1.99 L (59%)  59%   07/21/2016  2.56 L (57%)  1.54 L (46%)  60%   12/29/2016  2.80 L (63%)  1.57 L (47%)  56%   04/06/2017  2.61 L (59%)  1.46 L (44%)  56%   07/06/2017  2.56 L (58%)  1.49 L (45%)  58%   10/12/2017  2.63 L (59%)  1.47 L (44%)  56%   01/18/2018  2.68 L (63%)  1.36 L (42%)  51%   05/17/2018 2.49 L (57%)  1.25 L (38%)  50%   09/26/2018 2.43 L (57%)  1.26 L (39%)  52%   01/26/2019 2.16 L (51%)  1.16 L (36%)  54%   06/06/2019 2.47 L (59%) 1.11 L (34%) 45%   11/10/19 2.14 L (50%) 1.20 L (37%) 56%   02/13/20 2.01 L (48%) 1.08 L (34%) 54%   05/02/20 2.29 L (54%) 1.11 L (34%) 48%   07/02/20 (pre) 2.11 L (51%) 1.17 L (37%) 56%   07/02/20 (post) 2.26 L (54%) [+7.2%] 1.22 L (38%) [+4.0%] 54%     6-minute Walk (12/06/19): Heart Rate: Resting Heart Rate was 71 bpm. Maximum Heart Rate achieved 97 bpm. Patient did reach target heart rate (85-132 bpm).  Heart rate returned to 77 bpm after 2 minutes of recovery.  Oxygen Saturation: Resting O2 Saturation was 94% on RA. O2 saturation nadir was 87% at minute 2. O2 saturation improved to 96% with 2L via [place] at minute 3 and maintained saturations above 88% for remainder of walk. O2 saturation returned to 96% on room air after 2 minutes of recovery.  Distance: Total 6 minute walk distance was 320 m. Predicted 6 minute walk distance 548.9 m (LLN of 395.8 m). Percent of predicted 6 minute walk distance of 58%. Testing included one 5-minute pause. Distance obtained after placed on 2L and walked for an additional 6 minutes.  BORG: Resting BORG score was 1. Maximum BORG score was 5 at minute 2. Concluding BORG score was 0 after 2 minutes of recovery. Impression: Significant exercise induced oxygen desaturation (below 88%). Exertional hypoxemia improved with 2L supplemental oxygen. Walk distance is less than expected suggesting a cardio-respiratory defect -- clinical correlation suggested.  Recommendation: Supplemental oxygen required with exercise at 2L [place]. Consider further evaluation  with spirometry, lung volumes, respiratory muscle flow/force measurements, cardiopulmonary stress testing, and/or cardio/vascular studies.    6-Minute Walk (02/15/19): Resting Heart Rate was 91. Maximum Heart Rate achieved 119. Patient did reach target heart rate (86-133). Resting O2 Saturation was 98% on RA. Resting BORG score was 1. O2 saturation nadir was 91% at minute 6. Total 6 minute walk distance was 381 meters. Predicted 6 minute walk distance of 558 m (LLN of 483m). Percent of predicted 6 minute walk distance of 68%. Heart rate returned to 96 bpm after 2 minutes of recovery. O2 saturation returned to 96% on RA after 2 minutes of recovery. Maximum BORG score was 7 at minute 6. Concluding BORG score was 2 after 2 minutes of recovery. No evidence of resting or exercise induced hypoxemia. No supplemental oxygen is required at this time.    6-Minute Walk (01/26/19): Resting Heart Rate was 86. Maximum Heart Rate achieved 97. Patient did reach target heart rate (86-133). Resting O2 Saturation was 99% on RA. Resting BORG score was 1. O2 saturation nadir was 95% at minutes 3 and 6. Total 6 minute walk distance was 350 meters. Predicted 6 minute walk distance of 558 m (LLN of 456m). Percent of predicted 6 minute walk distance of 63%. Heart rate returned to 86 bpm after 2 minutes of recovery. O2 saturation returned to 96% on RA after 2 /minutes of recovery. Maximum BORG score was 6 at minutes 5 and 6. Concluding BORG score was 1 after 2 minutes of recovery. No evidence of resting or exercise induced hypoxemia. No supplemental oxygen is required at this time.    Cultures:  ?? Source Bacterial Culture AFB Smear AFB Culture   04/02/15 Sputum 2+ OPF; 1+ Probably mPsA Negative Negative   07/02/15 Sputum 4+ OPF; 3+ mPsA; 3+ sPsA; 1+ Steno Negative Negative   01/23/16 Sputum 2+ OPF; 1+ mPsA; 1+ sPsA - -   03/24/16 Sputum 3+ OPF; 3+ mPsA; 3+ sPsA Negative Overgrown   07/21/16 Sputum 4+ OPF; 4+ mPsA; 2+ sPsA Negative Negative   12/29/16 Sputum 4+ OPF; 3+ mPsA; 3+ sPsA Negative Actinomadura sp   04/06/17 Sputum 3+ OPF; 4+ mPsA Negative Negative   07/06/17 Sputum 4+ OPF; 3+ mPsA Negative Negative   10/12/17 Sputum 4+ OPF; 3+ mPsA Negative Negative   01/18/18 Sputum OPF Negative Negative   05/17/18 Sputum 4+ OPF; 3+ mPsA - -   09/26/18 Sputum 4+ OPF; 3+ mPsA Negative Negative   01/26/19 Sputum 4+ OPF; 2+ mPsA Negative Negative   02/15/19 Sputum 3+ OPF; 3+ mPsA Negative Negative   06/06/19 Sputum 3+ OPF; 3+ sPsA; 3+ mPsA Negative Negative   11/10/19 Sputum 2+ OPF; 3+ mPsA Negative Negative   02/13/20 Sputum OPF Negative Negative   ??  Bronchiectasis Evaluation   IgG with subclasses:   Lab Results   Component Value Date/Time    IGGT 996 04/28/2012 06:21 AM    IGG1 402 04/28/2012 06:21 AM    IGG2 399 04/28/2012 06:21 AM    IGG3 148.0 (H) 04/28/2012 06:21 AM    IGG4 47.7 04/28/2012 06:21 AM     IgA (09/21/11): 283   IgM (09/21/11): 52   IgE:   Lab Results   Component Value Date/Time    IGE 16.6 02/13/2020 10:24 AM     Specific Titers: No results found for: TETANUSAB, TETGV, DIPHTERIAAB, DIPGV, S. pneumonia titers  HIV: No results found for: HIV  Autoimmune: No results found for: ANA, ANATITER1, PAT1, ANATITER2, PAT2, DSDNAAB, ENAS, RF, CCPAB, CCPIGG  ANCA: No results found for: ANCA, IFA, PR3ELISA, PR3QT, MPO, MPOQT  CBC-D (05/02/20): 6.1>14.8/44.3<156; ANC 4.1; ALC 1.2; AEC 0.17  Lab Results   Component Value Date/Time    WBC 6.0 02/13/2020 10:24 AM    WBC 5.4 04/19/2017 05:45 AM    WBC 5.2 04/18/2017 05:23 AM    WBC 7.9 08/13/2013 03:57 AM    WBC 9.4 08/12/2013 04:21 AM    WBC 11.7 (H) 08/11/2013 04:04 AM    HGB 14.4 02/13/2020 10:24 AM    HGB 14.8 03/24/2015 05:31 PM    HCT 43.0 02/13/2020 10:24 AM    HCT 30.8 (L) 08/13/2013 03:57 AM    PLT 167 02/13/2020 10:24 AM    PLT 292 08/13/2013 03:57 AM    NEUTROABS 3.7 02/13/2020 10:24 AM    NEUTROABS 3.2 04/19/2017 05:45 AM    NEUTROABS 3.1 04/18/2017 05:23 AM    NEUTROABS 4.9 06/21/2013 09:31 PM    NEUTROABS 4.6 06/21/2013 08:13 PM    NEUTROABS 4.1 04/26/2012 11:54 PM    LYMPHSABS 1.4 02/13/2020 10:24 AM    LYMPHSABS 1.4 (L) 04/19/2017 05:45 AM    LYMPHSABS 1.4 (L) 04/18/2017 05:23 AM    LYMPHSABS 1.9 06/21/2013 09:31 PM    LYMPHSABS 1.8 06/21/2013 08:13 PM    LYMPHSABS 2.8 04/26/2012 11:54 PM    EOSABS 0.2 02/13/2020 10:24 AM    EOSABS 0.2 04/19/2017 05:45 AM    EOSABS 0.2 04/18/2017 05:23 AM    EOSABS 0.2 06/21/2013 09:31 PM    EOSABS 0.2 06/21/2013 08:13 PM    EOSABS 0.3 04/26/2012 11:54 PM    BASOSABS 0.0 02/13/2020 10:24 AM    BASOSABS 0.0 06/21/2013 09:31 PM     Alpha-1 level: No results found for: A1TRYP  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      IgE (05/23/18):               Imaging  CXR (05/02/20): Images not available for review. Per report, Visualized cardiac and mediastinal contours within normal limits allowing for mediastinal shift to the right. Circumferential right pleural fluid/thickening. Volume loss in the right lung consistent with known right lung resection. Irregular opacities throughout both lungs, right greater than left, consistent with known bronchiectasis.     Chest CT (11/10/19): Images personally reviewed. Stable sequelae of right middle lobectomy and right lower lobe wedge resection. Stable consolidation in the medial posterior right upper lobe and right lung apex, bilateral diffuse extensive bronchiectasis and bronchial wall thickening and unchanged sequelae of chronic endobronchial infection.    Chest CT (02/15/19): Images personally reviewed. Stable sequelae of right middle lobectomy and right lower lobe wedge resection.  Unchanged diffuse extensive bronchiectasis and bronchial wall thickening bilaterally.  Diffusely scattered tree-in-bud nodules in bilateral lung parenchyma with interval increase in tree-in-bud nodules in left lung; the constellation of these findings are consistent with sequelae of worsening chronic endobronchial infection.    Chest CT (03/31/16): Images personally reviewed. Status post right middle and right lower lobectomies. Tiny loculated basilar right hydropneumothorax, probably chronic. Moderate cylindrical and varicoid bronchiectasis throughout both lungs, with associated scattered mucoid impaction, tree-in-bud opacities and scattered peribronchovascular and subpleural consolidation  in the right upper lobe. 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 Two-vessel coronary atherosclerosis. Nonobstructing right nephrolithiasis.    Modified Barium Swallow (01/17/19): Shallow laryngeal penetration observed with thin liquids and puree.  No aspiration identified with any of the tested consistencies.    Immunization History   Administered Date(s) Administered   ??? COVID-19 VACC,MRNA,(PFIZER)(PF)(IM) 04/20/2019, 05/18/2019, 12/01/2019, 05/03/2020   ??? Hepatitis A 05/13/2020   ??? Hepatitis B, Adult 05/13/2020, 06/11/2020   ??? INFLUENZA TIV (TRI) 53MO+ W/ PRESERV (IM) 11/13/2010, 01/07/2012, 12/09/2017, 11/30/2019   ??? INFLUENZA TIV (TRI) PF (IM) 02/20/2012, 12/09/2012   ??? Influenza Vaccine Quad (IIV4 PF) 43mo+ injectable 01/31/2016, 12/09/2017   ??? Influenza Virus Vaccine, unspecified formulation 12/25/2014, 12/01/2016   ??? Influenza Whole 12/21/2008   ??? PNEUMOCOCCAL POLYSACCHARIDE 23 10/22/2010, 02/07/2016, 10/07/2019   ??? Pneumococcal Conjugate 13-Valent 04/28/2013   ??? TdaP 03/22/2012   ??? ZOSTAVAX - ZOSTER VACCINE, LIVE, SQ 07/03/2014, 06/11/2017, 01/19/2018     Portions of this record have been created using [employer]. Dictation errors have been sought, but may not have been identified and corrected.    Viona Gilmore, MD  07/02/20  10:08 AM

## 2020-07-02 ENCOUNTER — Encounter: Admit: 2020-07-02 | Discharge: 2020-07-02 | Payer: MEDICARE

## 2020-07-02 ENCOUNTER — Ambulatory Visit: Admit: 2020-07-02 | Discharge: 2020-07-02 | Payer: MEDICARE | Attending: Internal Medicine | Primary: Internal Medicine

## 2020-07-02 DIAGNOSIS — J479 Bronchiectasis, uncomplicated: Principal | ICD-10-CM

## 2020-07-02 DIAGNOSIS — Z515 Encounter for palliative care: Secondary | ICD-10-CM | POA: Diagnosis not present

## 2020-07-02 DIAGNOSIS — Z7951 Long term (current) use of inhaled steroids: Secondary | ICD-10-CM | POA: Diagnosis not present

## 2020-07-02 DIAGNOSIS — Z79891 Long term (current) use of opiate analgesic: Secondary | ICD-10-CM | POA: Diagnosis not present

## 2020-07-02 DIAGNOSIS — Z79899 Other long term (current) drug therapy: Secondary | ICD-10-CM | POA: Diagnosis not present

## 2020-07-02 DIAGNOSIS — R5383 Other fatigue: Secondary | ICD-10-CM | POA: Diagnosis not present

## 2020-07-02 DIAGNOSIS — Z881 Allergy status to other antibiotic agents status: Secondary | ICD-10-CM | POA: Diagnosis not present

## 2020-07-02 DIAGNOSIS — G8929 Other chronic pain: Secondary | ICD-10-CM | POA: Diagnosis not present

## 2020-07-02 DIAGNOSIS — A498 Other bacterial infections of unspecified site: Secondary | ICD-10-CM | POA: Diagnosis not present

## 2020-07-02 DIAGNOSIS — Z902 Acquired absence of lung [part of]: Secondary | ICD-10-CM | POA: Diagnosis not present

## 2020-07-02 DIAGNOSIS — Z792 Long term (current) use of antibiotics: Secondary | ICD-10-CM | POA: Diagnosis not present

## 2020-07-02 DIAGNOSIS — J9611 Chronic respiratory failure with hypoxia: Secondary | ICD-10-CM | POA: Diagnosis not present

## 2020-07-02 DIAGNOSIS — G4733 Obstructive sleep apnea (adult) (pediatric): Secondary | ICD-10-CM | POA: Diagnosis not present

## 2020-07-02 DIAGNOSIS — M549 Dorsalgia, unspecified: Secondary | ICD-10-CM | POA: Diagnosis not present

## 2020-07-02 DIAGNOSIS — I10 Essential (primary) hypertension: Secondary | ICD-10-CM | POA: Diagnosis not present

## 2020-07-02 DIAGNOSIS — Z885 Allergy status to narcotic agent status: Secondary | ICD-10-CM | POA: Diagnosis not present

## 2020-07-02 NOTE — Unmapped (Signed)
Provided Mr. Prazak with sterile specimen containers to take home. He states that he normally bring his sputum samples to [facility]. We do have standing orders in place.    Provided him with external orders should he need to drop off at an external location.    Recall appointment  (3 months) has been placed and AVS has been provided to patient.     Discussed that we should have Dr. Rosann Auerbach schedule open soon and to feel free to call so we can schedule him.

## 2020-07-03 DIAGNOSIS — R0600 Dyspnea, unspecified: Secondary | ICD-10-CM | POA: Diagnosis not present

## 2020-07-03 DIAGNOSIS — R0602 Shortness of breath: Secondary | ICD-10-CM | POA: Diagnosis not present

## 2020-07-03 DIAGNOSIS — J479 Bronchiectasis, uncomplicated: Secondary | ICD-10-CM | POA: Diagnosis not present

## 2020-07-03 DIAGNOSIS — J9610 Chronic respiratory failure, unspecified whether with hypoxia or hypercapnia: Secondary | ICD-10-CM | POA: Diagnosis not present

## 2020-07-03 NOTE — Unmapped (Signed)
The admin team has completed this request. The patient has been reached and is scheduled for:  - VISIT TYPE: Return Palliative Care  - DATE: 10/15/20  - TIME: 1:00 pm  - PROVIDER: Freida Busman  - ADDITIONAL NOTES:

## 2020-07-03 NOTE — Unmapped (Signed)
The admin team has completed this request. The patient has been reached and is scheduled for:  - VISIT TYPE: Return Palliative Care   - DATE: 10/01/20  - TIME: 12;45 PM  - PROVIDER: Molli Hazard  - ADDITIONAL NOTES: Patient not available on  09/24/20 ( Day after 4th July )

## 2020-07-05 NOTE — Unmapped (Signed)
Faxed consent form to [place] to continue [employer] manufacturer assistance.    Fax number [phone].

## 2020-07-08 DIAGNOSIS — R0602 Shortness of breath: Secondary | ICD-10-CM | POA: Diagnosis not present

## 2020-07-08 DIAGNOSIS — J9610 Chronic respiratory failure, unspecified whether with hypoxia or hypercapnia: Secondary | ICD-10-CM | POA: Diagnosis not present

## 2020-07-08 DIAGNOSIS — J479 Bronchiectasis, uncomplicated: Secondary | ICD-10-CM | POA: Diagnosis not present

## 2020-07-08 DIAGNOSIS — R0600 Dyspnea, unspecified: Secondary | ICD-10-CM | POA: Diagnosis not present

## 2020-07-09 DIAGNOSIS — Z0000 Encounter for general adult medical examination without abnormal findings: Secondary | ICD-10-CM | POA: Diagnosis not present

## 2020-07-09 DIAGNOSIS — G4733 Obstructive sleep apnea (adult) (pediatric): Secondary | ICD-10-CM | POA: Diagnosis not present

## 2020-07-09 DIAGNOSIS — E785 Hyperlipidemia, unspecified: Secondary | ICD-10-CM | POA: Diagnosis not present

## 2020-07-09 DIAGNOSIS — J479 Bronchiectasis, uncomplicated: Secondary | ICD-10-CM | POA: Diagnosis not present

## 2020-07-09 DIAGNOSIS — M810 Age-related osteoporosis without current pathological fracture: Secondary | ICD-10-CM | POA: Diagnosis not present

## 2020-07-10 DIAGNOSIS — R0600 Dyspnea, unspecified: Secondary | ICD-10-CM | POA: Diagnosis not present

## 2020-07-10 DIAGNOSIS — R0602 Shortness of breath: Secondary | ICD-10-CM | POA: Diagnosis not present

## 2020-07-10 DIAGNOSIS — J9610 Chronic respiratory failure, unspecified whether with hypoxia or hypercapnia: Secondary | ICD-10-CM | POA: Diagnosis not present

## 2020-07-10 DIAGNOSIS — J479 Bronchiectasis, uncomplicated: Secondary | ICD-10-CM | POA: Diagnosis not present

## 2020-07-10 NOTE — Unmapped (Signed)
The [facility] has made a second and final attempt to reach this patient to refill the following medication:  Tobramycin & Colistimethate.      We have left voicemails on the following phone numbers: [phone] and have sent a MyChart message.    Dates contacted: 06/28/20 & 07/10/20    Last scheduled delivery: 06/11/2020    The patient may be at risk of non-compliance with this medication. The patient should call the [facility] at [phone] (option 4) to refill medication.    Everard Interrante Leodis Binet   [facility] Specialty Technician

## 2020-07-11 DIAGNOSIS — J9610 Chronic respiratory failure, unspecified whether with hypoxia or hypercapnia: Secondary | ICD-10-CM | POA: Diagnosis not present

## 2020-07-11 DIAGNOSIS — R0600 Dyspnea, unspecified: Secondary | ICD-10-CM | POA: Diagnosis not present

## 2020-07-11 DIAGNOSIS — J479 Bronchiectasis, uncomplicated: Secondary | ICD-10-CM | POA: Diagnosis not present

## 2020-07-11 DIAGNOSIS — R0602 Shortness of breath: Secondary | ICD-10-CM | POA: Diagnosis not present

## 2020-07-12 DIAGNOSIS — J479 Bronchiectasis, uncomplicated: Secondary | ICD-10-CM | POA: Diagnosis not present

## 2020-07-12 DIAGNOSIS — R0600 Dyspnea, unspecified: Secondary | ICD-10-CM | POA: Diagnosis not present

## 2020-07-12 DIAGNOSIS — J9610 Chronic respiratory failure, unspecified whether with hypoxia or hypercapnia: Secondary | ICD-10-CM | POA: Diagnosis not present

## 2020-07-12 DIAGNOSIS — R0602 Shortness of breath: Secondary | ICD-10-CM | POA: Diagnosis not present

## 2020-07-12 NOTE — Unmapped (Signed)
[facility] Refill Coordination Note    Specialty Medication(s) to be Shipped:   CF/Pulmonary: -colistimethate 150 mg injection (COLYMYCIN)    Other medication(s) to be shipped: needles, syringes and normal saline     Jordan [name], DOB: Jul 20, 1955  Phone: [phone] (home)       All above HIPAA information was verified with patient.     Was a Nurse, learning disability used for this call? No    Completed refill call assessment today to schedule patient's medication shipment from the [facility] [phone]).  All relevant notes have been reviewed.     Specialty medication(s) and dose(s) confirmed: Regimen is correct and unchanged.   Changes to medications: Jordan [name] reports no changes at this time.  Changes to insurance: No  New side effects reported not previously addressed with a pharmacist or physician: None reported  Questions for the pharmacist: No    Confirmed patient received a Conservation officer, historic buildings and a Surveyor, mining with first shipment. The patient will receive a drug information handout for each medication shipped and additional FDA Medication Guides as required.       DISEASE/MEDICATION-SPECIFIC INFORMATION        N/A    SPECIALTY MEDICATION ADHERENCE     Medication Adherence    Patient reported X missed doses in the last month: 0  Specialty Medication: Colistimethate 150mg   Patient is on additional specialty medications: No  Support network for adherence: family member       Were doses missed due to medication being on hold? No    Colistimethate 150 mg injection (COLYMYCIN): 10 days of medicine on hand     REFERRAL TO PHARMACIST     Referral to the pharmacist: Not needed      [facility]     Shipping address confirmed in Epic.     Delivery Scheduled: Yes, Expected medication delivery date: 07/19/2020.     Medication will be delivered via UPS to the prescription address in Epic WAM.    Lorelei Pont [facility] Specialty Technician

## 2020-07-15 DIAGNOSIS — J479 Bronchiectasis, uncomplicated: Secondary | ICD-10-CM | POA: Diagnosis not present

## 2020-07-15 DIAGNOSIS — R0600 Dyspnea, unspecified: Secondary | ICD-10-CM | POA: Diagnosis not present

## 2020-07-15 DIAGNOSIS — R0602 Shortness of breath: Secondary | ICD-10-CM | POA: Diagnosis not present

## 2020-07-15 DIAGNOSIS — J9610 Chronic respiratory failure, unspecified whether with hypoxia or hypercapnia: Secondary | ICD-10-CM | POA: Diagnosis not present

## 2020-07-17 DIAGNOSIS — J479 Bronchiectasis, uncomplicated: Secondary | ICD-10-CM | POA: Diagnosis not present

## 2020-07-17 DIAGNOSIS — J9610 Chronic respiratory failure, unspecified whether with hypoxia or hypercapnia: Secondary | ICD-10-CM | POA: Diagnosis not present

## 2020-07-17 DIAGNOSIS — R0600 Dyspnea, unspecified: Secondary | ICD-10-CM | POA: Diagnosis not present

## 2020-07-17 DIAGNOSIS — R0602 Shortness of breath: Secondary | ICD-10-CM | POA: Diagnosis not present

## 2020-07-18 MED FILL — BD LUER-LOK SYRINGE 3 ML 20 GAUGE X 1 1/2": 30 days supply | Qty: 60 | Fill #2

## 2020-07-18 MED FILL — BD POSIFLUSH NORMAL SALINE 0.9 % INJECTION SYRINGE: 30 days supply | Qty: 180 | Fill #2

## 2020-07-18 MED FILL — BD REGULAR BEVEL NEEDLES 21 GAUGE X 1 1/2": 30 days supply | Qty: 60 | Fill #2

## 2020-07-19 DIAGNOSIS — R0602 Shortness of breath: Secondary | ICD-10-CM | POA: Diagnosis not present

## 2020-07-19 DIAGNOSIS — J9610 Chronic respiratory failure, unspecified whether with hypoxia or hypercapnia: Secondary | ICD-10-CM | POA: Diagnosis not present

## 2020-07-19 DIAGNOSIS — J479 Bronchiectasis, uncomplicated: Secondary | ICD-10-CM | POA: Diagnosis not present

## 2020-07-19 DIAGNOSIS — R0600 Dyspnea, unspecified: Secondary | ICD-10-CM | POA: Diagnosis not present

## 2020-07-22 DIAGNOSIS — J471 Bronchiectasis with (acute) exacerbation: Secondary | ICD-10-CM | POA: Diagnosis not present

## 2020-07-22 DIAGNOSIS — J479 Bronchiectasis, uncomplicated: Secondary | ICD-10-CM | POA: Diagnosis not present

## 2020-07-22 DIAGNOSIS — R0602 Shortness of breath: Secondary | ICD-10-CM | POA: Diagnosis not present

## 2020-07-22 DIAGNOSIS — R0600 Dyspnea, unspecified: Secondary | ICD-10-CM | POA: Diagnosis not present

## 2020-07-22 DIAGNOSIS — G4733 Obstructive sleep apnea (adult) (pediatric): Secondary | ICD-10-CM | POA: Diagnosis not present

## 2020-07-22 DIAGNOSIS — J9610 Chronic respiratory failure, unspecified whether with hypoxia or hypercapnia: Secondary | ICD-10-CM | POA: Diagnosis not present

## 2020-07-23 DIAGNOSIS — J9610 Chronic respiratory failure, unspecified whether with hypoxia or hypercapnia: Secondary | ICD-10-CM | POA: Diagnosis not present

## 2020-07-23 DIAGNOSIS — R0600 Dyspnea, unspecified: Secondary | ICD-10-CM | POA: Diagnosis not present

## 2020-07-23 DIAGNOSIS — R0602 Shortness of breath: Secondary | ICD-10-CM | POA: Diagnosis not present

## 2020-07-23 DIAGNOSIS — J479 Bronchiectasis, uncomplicated: Secondary | ICD-10-CM | POA: Diagnosis not present

## 2020-07-26 DIAGNOSIS — R0600 Dyspnea, unspecified: Secondary | ICD-10-CM | POA: Diagnosis not present

## 2020-07-26 DIAGNOSIS — J9610 Chronic respiratory failure, unspecified whether with hypoxia or hypercapnia: Secondary | ICD-10-CM | POA: Diagnosis not present

## 2020-07-26 DIAGNOSIS — R0602 Shortness of breath: Secondary | ICD-10-CM | POA: Diagnosis not present

## 2020-07-26 DIAGNOSIS — J479 Bronchiectasis, uncomplicated: Secondary | ICD-10-CM | POA: Diagnosis not present

## 2020-07-30 DIAGNOSIS — J479 Bronchiectasis, uncomplicated: Secondary | ICD-10-CM | POA: Diagnosis not present

## 2020-07-30 DIAGNOSIS — R0602 Shortness of breath: Secondary | ICD-10-CM | POA: Diagnosis not present

## 2020-07-30 DIAGNOSIS — R0600 Dyspnea, unspecified: Secondary | ICD-10-CM | POA: Diagnosis not present

## 2020-07-30 DIAGNOSIS — J9610 Chronic respiratory failure, unspecified whether with hypoxia or hypercapnia: Secondary | ICD-10-CM | POA: Diagnosis not present

## 2020-07-31 DIAGNOSIS — J471 Bronchiectasis with (acute) exacerbation: Secondary | ICD-10-CM | POA: Diagnosis not present

## 2020-07-31 DIAGNOSIS — R0602 Shortness of breath: Secondary | ICD-10-CM | POA: Diagnosis not present

## 2020-07-31 DIAGNOSIS — J9610 Chronic respiratory failure, unspecified whether with hypoxia or hypercapnia: Secondary | ICD-10-CM | POA: Diagnosis not present

## 2020-07-31 DIAGNOSIS — J479 Bronchiectasis, uncomplicated: Secondary | ICD-10-CM | POA: Diagnosis not present

## 2020-07-31 DIAGNOSIS — G4733 Obstructive sleep apnea (adult) (pediatric): Secondary | ICD-10-CM | POA: Diagnosis not present

## 2020-07-31 DIAGNOSIS — R0600 Dyspnea, unspecified: Secondary | ICD-10-CM | POA: Diagnosis not present

## 2020-08-01 DIAGNOSIS — J479 Bronchiectasis, uncomplicated: Principal | ICD-10-CM

## 2020-08-01 MED ORDER — SYMBICORT 160 MCG-4.5 MCG/ACTUATION HFA AEROSOL INHALER
1 refills | 0.00000 days
Start: 2020-08-01 — End: ?

## 2020-08-02 DIAGNOSIS — R0602 Shortness of breath: Secondary | ICD-10-CM | POA: Diagnosis not present

## 2020-08-02 DIAGNOSIS — R0600 Dyspnea, unspecified: Secondary | ICD-10-CM | POA: Diagnosis not present

## 2020-08-02 DIAGNOSIS — J479 Bronchiectasis, uncomplicated: Secondary | ICD-10-CM | POA: Diagnosis not present

## 2020-08-02 DIAGNOSIS — J9610 Chronic respiratory failure, unspecified whether with hypoxia or hypercapnia: Secondary | ICD-10-CM | POA: Diagnosis not present

## 2020-08-02 MED ORDER — SYMBICORT 160 MCG-4.5 MCG/ACTUATION HFA AEROSOL INHALER
1 refills | 0 days | Status: CP
Start: 2020-08-02 — End: ?

## 2020-08-05 DIAGNOSIS — R0600 Dyspnea, unspecified: Secondary | ICD-10-CM | POA: Diagnosis not present

## 2020-08-05 DIAGNOSIS — R0602 Shortness of breath: Secondary | ICD-10-CM | POA: Diagnosis not present

## 2020-08-05 DIAGNOSIS — J9610 Chronic respiratory failure, unspecified whether with hypoxia or hypercapnia: Secondary | ICD-10-CM | POA: Diagnosis not present

## 2020-08-05 DIAGNOSIS — J479 Bronchiectasis, uncomplicated: Secondary | ICD-10-CM | POA: Diagnosis not present

## 2020-08-06 DIAGNOSIS — J479 Bronchiectasis, uncomplicated: Principal | ICD-10-CM

## 2020-08-06 DIAGNOSIS — A498 Other bacterial infections of unspecified site: Principal | ICD-10-CM

## 2020-08-06 MED ORDER — ALCOHOL SWABS
5 refills | 0 days | Status: CP
Start: 2020-08-06 — End: 2021-08-06

## 2020-08-06 MED ORDER — WATER FOR INJECTION, STERILE INJECTION SOLUTION
5 refills | 0 days | Status: CP
Start: 2020-08-06 — End: ?
  Filled 2020-08-20: qty 900, 90d supply, fill #1

## 2020-08-07 DIAGNOSIS — J479 Bronchiectasis, uncomplicated: Secondary | ICD-10-CM | POA: Diagnosis not present

## 2020-08-07 DIAGNOSIS — R0600 Dyspnea, unspecified: Secondary | ICD-10-CM | POA: Diagnosis not present

## 2020-08-07 DIAGNOSIS — J9610 Chronic respiratory failure, unspecified whether with hypoxia or hypercapnia: Secondary | ICD-10-CM | POA: Diagnosis not present

## 2020-08-07 DIAGNOSIS — R0602 Shortness of breath: Secondary | ICD-10-CM | POA: Diagnosis not present

## 2020-08-09 DIAGNOSIS — J479 Bronchiectasis, uncomplicated: Secondary | ICD-10-CM | POA: Diagnosis not present

## 2020-08-09 DIAGNOSIS — J9610 Chronic respiratory failure, unspecified whether with hypoxia or hypercapnia: Secondary | ICD-10-CM | POA: Diagnosis not present

## 2020-08-09 DIAGNOSIS — R0600 Dyspnea, unspecified: Secondary | ICD-10-CM | POA: Diagnosis not present

## 2020-08-09 DIAGNOSIS — R0602 Shortness of breath: Secondary | ICD-10-CM | POA: Diagnosis not present

## 2020-08-12 DIAGNOSIS — J9610 Chronic respiratory failure, unspecified whether with hypoxia or hypercapnia: Secondary | ICD-10-CM | POA: Diagnosis not present

## 2020-08-12 DIAGNOSIS — R0602 Shortness of breath: Secondary | ICD-10-CM | POA: Diagnosis not present

## 2020-08-12 DIAGNOSIS — J479 Bronchiectasis, uncomplicated: Secondary | ICD-10-CM | POA: Diagnosis not present

## 2020-08-12 DIAGNOSIS — R0600 Dyspnea, unspecified: Secondary | ICD-10-CM | POA: Diagnosis not present

## 2020-08-13 NOTE — Unmapped (Signed)
The [facility] has made a second and final attempt to reach this patient to refill the following medication:  Colistimethate 150mg , Tobramycin 300mg /3ml.      We have left voicemails on the following phone numbers: [phone] (H) and have sent a MyChart message.    Dates contacted: 08/06/20 & 08/13/2020    Last scheduled delivery: 07/18/2020    The patient may be at risk of non-compliance with this medication. The patient should call the [facility] at [phone] (option 4) to refill medication.    Jordan [name]   [facility] Specialty Technician

## 2020-08-14 DIAGNOSIS — J479 Bronchiectasis, uncomplicated: Secondary | ICD-10-CM | POA: Diagnosis not present

## 2020-08-14 DIAGNOSIS — J9610 Chronic respiratory failure, unspecified whether with hypoxia or hypercapnia: Secondary | ICD-10-CM | POA: Diagnosis not present

## 2020-08-14 DIAGNOSIS — R0602 Shortness of breath: Secondary | ICD-10-CM | POA: Diagnosis not present

## 2020-08-14 DIAGNOSIS — R0600 Dyspnea, unspecified: Secondary | ICD-10-CM | POA: Diagnosis not present

## 2020-08-15 NOTE — Unmapped (Signed)
[facility] Refill Coordination Note    Specialty Medication(s) to be Shipped:   CF/Pulmonary: -[name] Brand (tobramycin 300mg /74mL) inhalation solution    Other medication(s) to be shipped: Sodium Chloride, Neb Cups,Symbicort   *Patient denied colistimethate 150 mg injection (COLYMYCIN)*     DIDIER BRANDENBURG, DOB: 06/15/1955  Phone: [phone] (home)       All above HIPAA information was verified with patient.     Was a Nurse, learning disability used for this call? No    Completed refill call assessment today to schedule patient's medication shipment from the [facility] [phone]).  All relevant notes have been reviewed.     Specialty medication(s) and dose(s) confirmed: Regimen is correct and unchanged.   Changes to medications: Rondal reports no changes at this time.  Changes to insurance: No  New side effects reported not previously addressed with a pharmacist or physician: None reported  Questions for the pharmacist: No    Confirmed patient received a Conservation officer, historic buildings and a Surveyor, mining with first shipment. The patient will receive a drug information handout for each medication shipped and additional FDA Medication Guides as required.       DISEASE/MEDICATION-SPECIFIC INFORMATION        N/A    SPECIALTY MEDICATION ADHERENCE     Medication Adherence    Patient reported X missed doses in the last month: 0  Specialty Medication: Tobramycin 300mg /40ml  Patient is on additional specialty medications: No  Patient is on more than two specialty medications: No  Any gaps in refill history greater than 2 weeks in the last 3 months: no  Demonstrates understanding of importance of adherence: yes  Informant: patient  Reliability of informant: reliable  Reasons for non-adherence: no problems identified  Support network for adherence: family member  Confirmed plan for next specialty medication refill: delivery by pharmacy  Refills needed for supportive medications: not needed       Were doses missed due to medication being on hold? No    Tobramycin 300mg /67ml: 7 days of medicine on hand     REFERRAL TO PHARMACIST     Referral to the pharmacist: Not needed      [facility]     Shipping address confirmed in Epic.     Delivery Scheduled: Yes, Expected medication delivery date: 08/21/2020.     Medication will be delivered via UPS to the prescription address in Epic WAM.    Aleenah Homen D Meggie Laseter   [facility] Specialty Technician

## 2020-08-16 DIAGNOSIS — R0600 Dyspnea, unspecified: Secondary | ICD-10-CM | POA: Diagnosis not present

## 2020-08-16 DIAGNOSIS — J9610 Chronic respiratory failure, unspecified whether with hypoxia or hypercapnia: Secondary | ICD-10-CM | POA: Diagnosis not present

## 2020-08-16 DIAGNOSIS — R0602 Shortness of breath: Secondary | ICD-10-CM | POA: Diagnosis not present

## 2020-08-16 DIAGNOSIS — J479 Bronchiectasis, uncomplicated: Secondary | ICD-10-CM | POA: Diagnosis not present

## 2020-08-20 MED FILL — TOBRAMYCIN 300 MG/5 ML IN 0.225 % SODIUM CHLORIDE FOR NEBULIZATION: RESPIRATORY_TRACT | 56 days supply | Qty: 280 | Fill #3

## 2020-08-20 MED FILL — SPIRIVA RESPIMAT 2.5 MCG/ACTUATION SOLUTION FOR INHALATION: RESPIRATORY_TRACT | 90 days supply | Qty: 12 | Fill #2

## 2020-08-20 MED FILL — LC PLUS MISC: 30 days supply | Qty: 1 | Fill #1

## 2020-08-21 DIAGNOSIS — R0600 Dyspnea, unspecified: Secondary | ICD-10-CM | POA: Diagnosis not present

## 2020-08-21 DIAGNOSIS — J9610 Chronic respiratory failure, unspecified whether with hypoxia or hypercapnia: Secondary | ICD-10-CM | POA: Diagnosis not present

## 2020-08-21 DIAGNOSIS — J479 Bronchiectasis, uncomplicated: Secondary | ICD-10-CM | POA: Diagnosis not present

## 2020-08-21 DIAGNOSIS — R0602 Shortness of breath: Secondary | ICD-10-CM | POA: Diagnosis not present

## 2020-08-22 DIAGNOSIS — J479 Bronchiectasis, uncomplicated: Secondary | ICD-10-CM | POA: Diagnosis not present

## 2020-08-22 DIAGNOSIS — G4733 Obstructive sleep apnea (adult) (pediatric): Secondary | ICD-10-CM | POA: Diagnosis not present

## 2020-08-22 DIAGNOSIS — J471 Bronchiectasis with (acute) exacerbation: Secondary | ICD-10-CM | POA: Diagnosis not present

## 2020-08-23 DIAGNOSIS — J479 Bronchiectasis, uncomplicated: Secondary | ICD-10-CM | POA: Diagnosis not present

## 2020-08-23 DIAGNOSIS — R0600 Dyspnea, unspecified: Secondary | ICD-10-CM | POA: Diagnosis not present

## 2020-08-23 DIAGNOSIS — J9610 Chronic respiratory failure, unspecified whether with hypoxia or hypercapnia: Secondary | ICD-10-CM | POA: Diagnosis not present

## 2020-08-23 DIAGNOSIS — R0602 Shortness of breath: Secondary | ICD-10-CM | POA: Diagnosis not present

## 2020-08-26 DIAGNOSIS — J479 Bronchiectasis, uncomplicated: Secondary | ICD-10-CM | POA: Diagnosis not present

## 2020-08-26 DIAGNOSIS — J9610 Chronic respiratory failure, unspecified whether with hypoxia or hypercapnia: Secondary | ICD-10-CM | POA: Diagnosis not present

## 2020-08-26 DIAGNOSIS — R0602 Shortness of breath: Secondary | ICD-10-CM | POA: Diagnosis not present

## 2020-08-26 DIAGNOSIS — R0600 Dyspnea, unspecified: Secondary | ICD-10-CM | POA: Diagnosis not present

## 2020-08-27 DIAGNOSIS — R0602 Shortness of breath: Secondary | ICD-10-CM | POA: Diagnosis not present

## 2020-08-27 DIAGNOSIS — J9610 Chronic respiratory failure, unspecified whether with hypoxia or hypercapnia: Secondary | ICD-10-CM | POA: Diagnosis not present

## 2020-08-27 DIAGNOSIS — J479 Bronchiectasis, uncomplicated: Secondary | ICD-10-CM | POA: Diagnosis not present

## 2020-08-27 DIAGNOSIS — R0600 Dyspnea, unspecified: Secondary | ICD-10-CM | POA: Diagnosis not present

## 2020-08-30 DIAGNOSIS — R0602 Shortness of breath: Secondary | ICD-10-CM | POA: Diagnosis not present

## 2020-08-30 DIAGNOSIS — R0600 Dyspnea, unspecified: Secondary | ICD-10-CM | POA: Diagnosis not present

## 2020-08-30 DIAGNOSIS — J9610 Chronic respiratory failure, unspecified whether with hypoxia or hypercapnia: Secondary | ICD-10-CM | POA: Diagnosis not present

## 2020-08-30 DIAGNOSIS — J479 Bronchiectasis, uncomplicated: Secondary | ICD-10-CM | POA: Diagnosis not present

## 2020-08-31 DIAGNOSIS — J479 Bronchiectasis, uncomplicated: Secondary | ICD-10-CM | POA: Diagnosis not present

## 2020-08-31 DIAGNOSIS — G4733 Obstructive sleep apnea (adult) (pediatric): Secondary | ICD-10-CM | POA: Diagnosis not present

## 2020-08-31 DIAGNOSIS — J471 Bronchiectasis with (acute) exacerbation: Secondary | ICD-10-CM | POA: Diagnosis not present

## 2020-09-02 DIAGNOSIS — J479 Bronchiectasis, uncomplicated: Secondary | ICD-10-CM | POA: Diagnosis not present

## 2020-09-02 DIAGNOSIS — J9610 Chronic respiratory failure, unspecified whether with hypoxia or hypercapnia: Secondary | ICD-10-CM | POA: Diagnosis not present

## 2020-09-02 DIAGNOSIS — R0600 Dyspnea, unspecified: Secondary | ICD-10-CM | POA: Diagnosis not present

## 2020-09-02 DIAGNOSIS — R0602 Shortness of breath: Secondary | ICD-10-CM | POA: Diagnosis not present

## 2020-09-04 DIAGNOSIS — R0602 Shortness of breath: Secondary | ICD-10-CM | POA: Diagnosis not present

## 2020-09-04 DIAGNOSIS — J9610 Chronic respiratory failure, unspecified whether with hypoxia or hypercapnia: Secondary | ICD-10-CM | POA: Diagnosis not present

## 2020-09-04 DIAGNOSIS — J479 Bronchiectasis, uncomplicated: Secondary | ICD-10-CM | POA: Diagnosis not present

## 2020-09-04 DIAGNOSIS — R0600 Dyspnea, unspecified: Secondary | ICD-10-CM | POA: Diagnosis not present

## 2020-09-05 DIAGNOSIS — J9610 Chronic respiratory failure, unspecified whether with hypoxia or hypercapnia: Secondary | ICD-10-CM | POA: Diagnosis not present

## 2020-09-05 DIAGNOSIS — J479 Bronchiectasis, uncomplicated: Secondary | ICD-10-CM | POA: Diagnosis not present

## 2020-09-05 DIAGNOSIS — R0602 Shortness of breath: Secondary | ICD-10-CM | POA: Diagnosis not present

## 2020-09-05 DIAGNOSIS — R0600 Dyspnea, unspecified: Secondary | ICD-10-CM | POA: Diagnosis not present

## 2020-09-06 DIAGNOSIS — J9610 Chronic respiratory failure, unspecified whether with hypoxia or hypercapnia: Secondary | ICD-10-CM | POA: Diagnosis not present

## 2020-09-06 DIAGNOSIS — R0602 Shortness of breath: Secondary | ICD-10-CM | POA: Diagnosis not present

## 2020-09-06 DIAGNOSIS — R0600 Dyspnea, unspecified: Secondary | ICD-10-CM | POA: Diagnosis not present

## 2020-09-06 DIAGNOSIS — J479 Bronchiectasis, uncomplicated: Secondary | ICD-10-CM | POA: Diagnosis not present

## 2020-09-07 MED ORDER — BD LUER-LOK SYRINGE 3 ML 20 GAUGE X 1"
5 refills | 0.00000 days
Start: 2020-09-07 — End: ?

## 2020-09-09 DIAGNOSIS — R0602 Shortness of breath: Secondary | ICD-10-CM | POA: Diagnosis not present

## 2020-09-09 DIAGNOSIS — R0600 Dyspnea, unspecified: Secondary | ICD-10-CM | POA: Diagnosis not present

## 2020-09-09 DIAGNOSIS — J479 Bronchiectasis, uncomplicated: Secondary | ICD-10-CM | POA: Diagnosis not present

## 2020-09-09 DIAGNOSIS — J9610 Chronic respiratory failure, unspecified whether with hypoxia or hypercapnia: Secondary | ICD-10-CM | POA: Diagnosis not present

## 2020-09-10 DIAGNOSIS — J479 Bronchiectasis, uncomplicated: Secondary | ICD-10-CM | POA: Diagnosis not present

## 2020-09-10 DIAGNOSIS — J9610 Chronic respiratory failure, unspecified whether with hypoxia or hypercapnia: Secondary | ICD-10-CM | POA: Diagnosis not present

## 2020-09-10 DIAGNOSIS — R0600 Dyspnea, unspecified: Secondary | ICD-10-CM | POA: Diagnosis not present

## 2020-09-10 DIAGNOSIS — R0602 Shortness of breath: Secondary | ICD-10-CM | POA: Diagnosis not present

## 2020-09-10 MED ORDER — BD LUER-LOK SYRINGE 3 ML 20 GAUGE X 1"
5 refills | 0 days | Status: CP
Start: 2020-09-10 — End: ?
  Filled 2020-09-19: qty 60, 30d supply, fill #3

## 2020-09-11 DIAGNOSIS — J9610 Chronic respiratory failure, unspecified whether with hypoxia or hypercapnia: Secondary | ICD-10-CM | POA: Diagnosis not present

## 2020-09-11 DIAGNOSIS — J479 Bronchiectasis, uncomplicated: Secondary | ICD-10-CM | POA: Diagnosis not present

## 2020-09-11 DIAGNOSIS — R0600 Dyspnea, unspecified: Secondary | ICD-10-CM | POA: Diagnosis not present

## 2020-09-11 DIAGNOSIS — R0602 Shortness of breath: Secondary | ICD-10-CM | POA: Diagnosis not present

## 2020-09-13 DIAGNOSIS — J479 Bronchiectasis, uncomplicated: Secondary | ICD-10-CM | POA: Diagnosis not present

## 2020-09-13 DIAGNOSIS — R0602 Shortness of breath: Secondary | ICD-10-CM | POA: Diagnosis not present

## 2020-09-13 DIAGNOSIS — R0600 Dyspnea, unspecified: Secondary | ICD-10-CM | POA: Diagnosis not present

## 2020-09-13 DIAGNOSIS — J9610 Chronic respiratory failure, unspecified whether with hypoxia or hypercapnia: Secondary | ICD-10-CM | POA: Diagnosis not present

## 2020-09-16 DIAGNOSIS — J9610 Chronic respiratory failure, unspecified whether with hypoxia or hypercapnia: Secondary | ICD-10-CM | POA: Diagnosis not present

## 2020-09-16 DIAGNOSIS — J479 Bronchiectasis, uncomplicated: Secondary | ICD-10-CM | POA: Diagnosis not present

## 2020-09-16 DIAGNOSIS — R0600 Dyspnea, unspecified: Secondary | ICD-10-CM | POA: Diagnosis not present

## 2020-09-16 DIAGNOSIS — R0602 Shortness of breath: Secondary | ICD-10-CM | POA: Diagnosis not present

## 2020-09-18 DIAGNOSIS — J479 Bronchiectasis, uncomplicated: Secondary | ICD-10-CM | POA: Diagnosis not present

## 2020-09-18 DIAGNOSIS — R0600 Dyspnea, unspecified: Secondary | ICD-10-CM | POA: Diagnosis not present

## 2020-09-18 DIAGNOSIS — J9610 Chronic respiratory failure, unspecified whether with hypoxia or hypercapnia: Secondary | ICD-10-CM | POA: Diagnosis not present

## 2020-09-18 DIAGNOSIS — R0602 Shortness of breath: Secondary | ICD-10-CM | POA: Diagnosis not present

## 2020-09-18 NOTE — Unmapped (Signed)
[facility] Refill Coordination Note    Specialty Medication(s) to be Shipped:   CF/Pulmonary: -Nebulized colistin (150mg  vials) and supply kit  Other medication(s) to be shipped: BS Luer-Lok Syringes, BD Posiflush 0.9% inj, BD Regular Bevel Needles & LC Plus neb cups     Jordan [name], DOB: 08-Jan-1956  Phone: [phone] (home)     All above HIPAA information was verified with patient.     Was a Nurse, learning disability used for this call? No    Completed refill call assessment today to schedule patient's medication shipment from the [facility] [phone]).  All relevant notes have been reviewed.     Specialty medication(s) and dose(s) confirmed: Regimen is correct and unchanged.   Changes to medications: Zakkary reports no changes at this time.  Changes to insurance: No  New side effects reported not previously addressed with a pharmacist or physician: None reported  Questions for the pharmacist: No    Confirmed patient received a Conservation officer, historic buildings and a Surveyor, mining with first shipment. The patient will receive a drug information handout for each medication shipped and additional FDA Medication Guides as required.       DISEASE/MEDICATION-SPECIFIC INFORMATION        For CF patients: CF [facility] Active? No-not enrolled    SPECIALTY MEDICATION ADHERENCE     Medication Adherence    Patient reported X missed doses in the last month: 0  Specialty Medication: Colitimethate 150mg   Patient is on additional specialty medications: Yes  Additional Specialty Medications: Tobramycin 300mg /58ml  Patient Reported Additional Medication X Missed Doses in the Last Month: 0  Patient is on more than two specialty medications: No  Informant: patient  Reliability of informant: reliable  Reasons for non-adherence: no problems identified  Support network for adherence: family member  Confirmed plan for next specialty medication refill: delivery by pharmacy  Refills needed for supportive medications: not needed        Were doses missed due to medication being on hold? No    Colistimethate 150 mg: 0 days of medicine on hand (Start 09/20/20)    REFERRAL TO PHARMACIST     Referral to the pharmacist: Not needed    [facility]     Shipping address confirmed in Epic.     Delivery Scheduled: Yes, Expected medication delivery date: 09/20/2020.     Medication will be delivered via UPS to the prescription address in Epic WAM.    Jeralynn Vaquera P [facility] [place] [facility] Specialty Technician

## 2020-09-19 MED FILL — BD REGULAR BEVEL NEEDLES 19 GAUGE X 1 1/2": 30 days supply | Qty: 60 | Fill #0

## 2020-09-19 MED FILL — BD POSIFLUSH NORMAL SALINE 0.9 % INJECTION SYRINGE: 30 days supply | Qty: 180 | Fill #3

## 2020-09-19 MED FILL — COLISTIN (COLISTIMETHATE SODIUM) 150 MG SOLUTION FOR INJECTION: 30 days supply | Qty: 60 | Fill #1

## 2020-09-19 MED FILL — LC PLUS MISC: 30 days supply | Qty: 1 | Fill #2

## 2020-09-20 DIAGNOSIS — J479 Bronchiectasis, uncomplicated: Secondary | ICD-10-CM | POA: Diagnosis not present

## 2020-09-20 DIAGNOSIS — J9610 Chronic respiratory failure, unspecified whether with hypoxia or hypercapnia: Secondary | ICD-10-CM | POA: Diagnosis not present

## 2020-09-20 DIAGNOSIS — R0602 Shortness of breath: Secondary | ICD-10-CM | POA: Diagnosis not present

## 2020-09-20 DIAGNOSIS — R0600 Dyspnea, unspecified: Secondary | ICD-10-CM | POA: Diagnosis not present

## 2020-09-21 DIAGNOSIS — J471 Bronchiectasis with (acute) exacerbation: Secondary | ICD-10-CM | POA: Diagnosis not present

## 2020-09-21 DIAGNOSIS — J479 Bronchiectasis, uncomplicated: Secondary | ICD-10-CM | POA: Diagnosis not present

## 2020-09-21 DIAGNOSIS — G4733 Obstructive sleep apnea (adult) (pediatric): Secondary | ICD-10-CM | POA: Diagnosis not present

## 2020-09-24 ENCOUNTER — Encounter
Admit: 2020-09-24 | Discharge: 2020-09-25 | Payer: MEDICARE | Attending: Hospice and Palliative Medicine | Primary: Hospice and Palliative Medicine

## 2020-09-24 DIAGNOSIS — J479 Bronchiectasis, uncomplicated: Principal | ICD-10-CM

## 2020-09-24 DIAGNOSIS — Z515 Encounter for palliative care: Principal | ICD-10-CM

## 2020-09-24 DIAGNOSIS — R5383 Other fatigue: Secondary | ICD-10-CM | POA: Diagnosis not present

## 2020-09-24 DIAGNOSIS — M549 Dorsalgia, unspecified: Secondary | ICD-10-CM | POA: Diagnosis not present

## 2020-09-24 DIAGNOSIS — R0600 Dyspnea, unspecified: Secondary | ICD-10-CM | POA: Diagnosis not present

## 2020-09-24 DIAGNOSIS — R059 Cough, unspecified: Secondary | ICD-10-CM | POA: Diagnosis not present

## 2020-09-24 DIAGNOSIS — R5382 Chronic fatigue, unspecified: Principal | ICD-10-CM

## 2020-09-24 DIAGNOSIS — R0602 Shortness of breath: Principal | ICD-10-CM

## 2020-09-24 DIAGNOSIS — J9611 Chronic respiratory failure with hypoxia: Principal | ICD-10-CM

## 2020-09-24 NOTE — Unmapped (Signed)
Outpatient Palliative Care Consult Note    Reason for Consult Request: Goals of Care / Decision Making, Patient and Family Support and advance care planning  Primary Care Provider:  Barbaraann Boys, MD  Referring physician: Dr. Rolm [name]    Assessment: Jordan [name] is a 65 y.o. male with bronchiectasis with chronic pseudomonas colonization and chronic hypoxemic respiratory failure requiring supplemental O2 who presents to palliative care for patient support, advance care planning and decision-making.       Plan:     1. Dyspnea/cough  - Currently on 2L [place], remains diligent with pulmonary clearance regimen  - Finishes pulmonary rehab at the end of June/early July --> has been beneficial, using rescue inhaler much less but still has chronic dyspnea with exertion.   - Will monitor    2. Fatigue  - Finishes pulmonary rehab at the end of June/early July --> Stable.    3. Chronic back pain  - On Norco 7.5-325 for many years (stable dose, uses ~3x/day)    4. Goals of care:  - Understanding of medical condition and prognosis: recognizes he has steadily decreasing PFTs though clinically has been relatively stable. Not a transplant candidate. He has previously discussed with Dr. Garner [name] re: prognosis and was told a range of 5-10 years --> felt encouraged by this.   - Goals and Priorities:     -- Get off O2    -- Reduce # of breathing treatments and amount of time these take (currently 4 hours), get off some meds    -- To be more active, travel with his wife    -- Ultimately hoping that therapy + future technology will give him many more years (and extend the life of his lungs)    -- Wants to see the milestones in grandkids' lives  - Worries: doesn't worry too much but shares that his biggest concerns include the cost of his meds/breathing treaments (pulmozyme, colistin/tobra) and how these have increased since he changed insurances.    4. Advance Care Planning  - Surrogate Decision-maker and/or Health care agent: HCDM (HCPOA): Jordan [name], Jordan [name] - [phone] - updated in chart  - Advance Directives: Provided [place] [facility] to patient today. He does have a living will and HCPOA paperwork and will bring to his next visit.  - Code status: not discussed at this visit, can address in the future.  - Treatment decisions: He has been told he is not a candidate for transplant but would be open to pursuing at another institution if it was ever an option.     5. Coping/Support:  - Feels at peace with his current situation, hopeful that rehab will provide him with improvements in dyspnea/cough/fatigue. Hopeful for life prolongation.   - Feels like things are in God's hands. 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 He tries to balance going on certain trips while at times declining other trips. Discussed potential for getting a set of extra supplies prior to travel in case anything breaks. May require some out of pocket cost though.     6. Care coordination  - Will plan for follow up visit to coordinate with Dr. Garner [name] next appointment after August appointment with her.    F/u: Seeing Dr. Garner [name] in August, will plan to schedule follow up when he sees  her next after this August visit.    ---------------------------------------    HPI:   Jordan [name] is a 65 y.o. male with history of bronchiectasis s/p RML and RLL 2/2 abscess with chronic pseudomonas colonization and chronic hypoxemic respiratory failure requiring supplemental O2 presenting to palliative care clinic for patient support and decision-making.     Visit today with patient and his wife Jordan [name].    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 He has had lung issues for many years and had a repeat lung resection in 2012. Followed with a local pulmonologist until 2014 when he was transferred to [facility] because he was going downhill quickly and needed to be hospitalized. This was the first time he heard the term bronchiectasis. He has been following with Dr. Garner [name] since 2014 and last saw her on 05/10/20. Diagnosed with local prostate cancer a few years ago and is s/p prostatectomy. Overall his lung disease has been steadily declining over the last several years, now on 2L O2. Underwent transplant evaluation recently at [facility] however deemed not to be a candidate due to prior lung resections. Got second opinion at [facility] for evaluation however they too did not feel he was a candidate.   - Reports that he was shocked to hear [facility] say he needed a transplant but that it would kill him --> didn't realize the severity of the situation.   - At peace with the fact that transplant is not an option. Feels it is in God's hands. Also understands that lung transplant comes with many risks and has the poorest success rate of all transplants and that if successful, they don't last forever.     States he recently started and at home pulmonary rehab program- has had 4 sessions so far and he is cautiously optimistic this will provide him with relief. He has learned a lot and says that the respiratory therapist has made a lot of promises so hoping this will help. Hopeful he will get off oxygen therapy so that he can have more freedoms in term of spending time with his family and traveling.     Symptom Review:  Pain: history of chronic back pain and multiple prior spinal surgeries, has been on Norco (7.5-325) for many years (uses ~3x/day). Pain level usually 2-3.  Fatigue: most burdensome symptom-sometimes falls asleep during the day. More energy during the day not great though.   Mobility: slow. SOB is a large part of it.  Sleep: no issues  Appetite: normal, no weight loss (gained 6-7lbs)  Nausea: once a month. Sometimes has emesis after coughing.   Bowel function: denies  Dyspnea: shortness of breath with activity- few steps and he is SOB, uses oxygen with ambulation and sleep (started last Sept) - uses 2L. Thinks extra weight makes it worse.    Cough: sometimes has bad coughing fits and has post-tussive emesis. Doesn't have significant coughing fits every day (major ones once a week).  Secretions: lots of sputum production with breathing treatments.   Mood: aggrevated/frustrated by breathing treatments. Pushes himself to do them. Denies depression/anxiety.    Interval history 7/5 KA: Doing okay. Has been going to pulmonary rehab and has learned some techniques that have been helpful but still with dyspnea with exertion; using rescue inhaler much less. Has mostly clear but sometimes yellow sputum. Uses oxygen if walking more than 200 yards. Spending time with family  and grandchildren is important to him and he wishes he could do more. Does feel like quality of life isn't exactly where he would want it but he tries and do what he can. Wishes he could travel more and be more active with his grandchildren.    REVIEW OF SYSTEMS:  A comprehensive review of 10 systems was negative except for pertinent positives noted in HPI.    Past Medical History:   Patient Active Problem List   Diagnosis   ??? Bronchiectasis (CMS-HCC)   ??? Esophagitis   ??? Pseudomonas aeruginosa infection   ??? Daytime somnolence   ??? Memory loss   ??? Hypersomnia with sleep apnea   ??? Essential hypertension   ??? Osteoarthrosis   ??? Dizziness   ??? Excessive daytime sleepiness   ??? Sleep disturbances   ??? Loculated pleural effusion   ??? Moderate Pharyngeal dysphagia   ??? IDA (iron deficiency anemia)   ??? Disequilibrium   ??? Gastroesophageal reflux disease without esophagitis   ??? Hoarseness   ??? Dyspnea on exertion   ??? Chronic respiratory failure with hypoxia (CMS-HCC)     Past Medical History:   Diagnosis Date   ??? Abscess of lung (CMS-HCC) 11/06/2010    CT Chest 10/22/10 12/05/2010 right thoracotomy resection of right middle lobe and resection of right lower lobe abscess    ??? Arthritis     back   ??? Biceps tendon tear 2013    left side   ??? Bronchiectasis (CMS-HCC)     chronic psuedomonas infection   ??? Degenerative joint disease of left acromioclavicular joint    ??? GERD (gastroesophageal reflux disease)    ??? Hypertension    ??? Obstructive sleep apnea on CPAP 03/14/13    AHI 33.5, on BiPAP 12/8 based on sleep study 09/2013   ??? Pneumonia 2012    Lung abcess    ??? PONV (postoperative nausea and vomiting)    ??? Prostate cancer (CMS-HCC) 09/09/2017   ??? Rotator cuff injury left   ??? Vertigo      Surgical History:    Past Surgical History:   Procedure Laterality Date   ??? APPENDECTOMY  1969   ??? BACK SURGERY     ??? BRONCHOSCOPY  10/19/2010    [facility]   ??? CARPAL TUNNEL RELEASE  1999   ??? LUNG REMOVAL, PARTIAL Right 10/2010    RML and RLL partially resected   ??? MENISCECTOMY  2011   ??? NECK SURGERY  1993, 1999, 2002   ??? PR GERD TST W/ MUCOS IMPEDE ELECTROD,>1HR N/A 07/06/2012    Procedure: ESOPHAGEAL FUNCTION TEST, GASTROESOPHAGEAL REFLUX TEST W/ NASAL CATHETER INTRALUMINAL IMPEDANCE ELECTRODE(S) PLACEMENT, RECORDING, ANALYSIS AND INTERPRETATION; PROLONGED;  Surgeon: None None;  [facility];  Service: Gastroenterology   ??? PR OPEN TREAT RIB FRACTURE W/INT FIXATION, UNILATERAL, 1-2 RIBS Right 03/19/2015    Procedure: OPEN TREATMENT OF RIB FRACTURE REQUIRING INTERNAL FIXATION, UNILATERAL; 1-2 RIBS;  Surgeon: Evert Kohl, MD;  Location: MAIN OR [facility];  Service: Cardiothoracic   ??? PR THORACOTOMY W/THERAP WEDGE RESEXN ADDL IPSILATRL Right 08/07/2013    Procedure: THORACOTOMY; WITH THERAPEUTIC LOBECTOMY OF RIGHT MIDDLE AND RIGHT LOWER LOBE RESECTION , EACH ADDITIONAL RESECTION, IPSILATERAL;  Surgeon: Alvester Chou, MD;  Location: MAIN OR [facility];  Service: Cardiothoracic   ??? TONSILLECTOMY  1965     Personal and Social History: Lives in [place] with wife. He has 2 children (son/daughter) and 5 grandchildren (13-7), all who live closeby. 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 Enjoys spending time with grandkids, traveling. Wife retired in Sept.     Advance Care Planning:   HCPOA:   HCDM (HCPOA): Jordan [name], Jordan [name] - [phone]  Natural surrogate decision maker: spouse  Living Will: have them, not in chart  -- Have nearly planned their funerals  ACP note: none on file    Family History:  Cancer-related family history is not on file.  He indicated that his mother is deceased. He indicated that his father is deceased. He indicated that both of his brothers are alive. He indicated that the status of his daughter is unknown. He indicated that the status of his son is unknown. He indicated that the status of his neg hx is unknown.    Medications prior to visit:   Current Outpatient Medications   Medication Instructions   ??? albuterol HFA 90 mcg/actuation inhaler 2 puffs, Inhalation, 4 times a day   ??? albuterol 2.5 mg, Nebulization, 2 times a day (standard)   ??? alcohol swabs (ALCOHOL PREP PADS) PadM Use as directed with inhaled antibiotics   ??? ascorbic acid (vitamin C) (VITAMIN C) 1,000 mg, Oral, Daily (standard)   ??? azithromycin (ZITHROMAX) 250 mg, Oral, Daily (standard)   ??? cholecalciferol (vitamin D3-50 mcg (2,000 unit)) 2,000 Units, Oral   ??? coenzyme Q10 10-300 mg, Oral   ??? colistimethate (COLYMYCIN) 150 mg injection Inject 2 mL sterile water for injection to mix colistin vial, then draw up 2 mL (150mg ) and inhale 2 times a day, 28 days on and 28 days off.   ??? dornase alfa (PULMOZYME) 1 mg/mL nebulizer solution Inhale 1 ampule (2.5 mg) daily. Use at least 30-60 minutes before airway clearance, or after airway clearance   ??? empty container Misc USE AS DIRECTED   ??? ferrous sulfate (IRON (FERROUS SULFATE)) 325 mg, Oral, 2 times a day, BID   ??? HYDROcodone-acetaminophen (NORCO) 7.5-325 mg per tablet 1 tablet, Oral, 3 times a day   ??? HYDROcodone-homatropine (HYCODAN) 5-1.5 mg/5 mL syrup 5 mL, Oral, Every 4 hours PRN   ??? montelukast (SINGULAIR) 10 mg, Oral, Nightly   ??? multivitamin-minerals-lutein Tab 1 tablet, Oral, Daily (standard)   ??? nebulizers (LC PLUS) Misc use with Colistin   ??? nebulizers (LC PLUS) Misc Use as directed with hypertonic saline 10%   ??? nebulizers Misc Use as directed with tobramycin   ??? needle, disp, 19 G (BD REGULAR BEVEL NEEDLES) 19 gauge x 1 1/2 Ndle Use as directed with inhaled Colistin   ??? omeprazole (PRILOSEC) 20 mg, Oral, 2 times a day   ??? sodium chloride (BD POSIFLUSH NORMAL SALINE 0.9) 0.9 % injection Inject 2mL of 0.9%NaCl into colistin vial & gently mix. After withdrawing colistin dose, add an additional 1mL of 0.9%NaCl to neb cup with the colistin dose.   ??? sodium chloride 10 % Nebu 5 mL, Nebulization, 2 times a day (standard)   ??? sterile water Soln Use 2mL to mix Colistin,then add additional 2mL to neb cup with 2mL of mixed colistin.Inhale twice daily 28 days on and 28 days off.   ??? SYMBICORT 160-4.5 mcg/actuation inhaler INHALE 2 PUFFS INTO THE LUNGS 2 TIMES A DAY.   ??? syringe with needle (BD LUER-LOK SYRINGE) 3 mL 20 gauge x 1 1/2 Syrg For use with inhaled antibiotic (Colistin)   ??? syringe with needle (BD LUER-LOK SYRINGE) 3 mL 20 gauge x 1 Syrg For use with inhaled antibiotic (Colistin)   ??? tiotropium bromide (  SPIRIVA RESPIMAT) 2.5 mcg/actuation inhalation mist Inhale 2 puffs daily.   ??? tobramycin, PF, ([name]) 300 mg/5 mL nebulizer solution 300 mg, Nebulization, Every 12 hours, 28 days on and 28 days off.   ??? zinc gluconate 50 mg, Oral     Allergies:   Allergies   Allergen Reactions   ??? Gentamicin Other (See Comments)     BALANCE ISSUES   ??? Tobramycin Other (See Comments)     ototoxicity   ??? Morphine Nausea And Vomiting     Palliative Performance Scale: 60% - Ambulation: Reduced / unable to do hobby or some housework, significant disease / Self-Care:Occasional assist as necessary / Intake:Normal or reduced / Level of Conscious: Full or confusion    PHYSICAL EXAM:   Vital signs for this encounter: There were no vitals taken for this visit.  GEN: Older man, sitting in chair, NAD  EYES: anicteric, no discharge  ENT: mask in place, not wearing O2 but has available   CARDIO: no heave, no edema  RESP: normal work of breathing, no accessory muscle use  GI: no distension   MSK: no sarcopenia, no joint swelling   SKIN: no rash of visible skin  NEURO: alert and oriented x 3, muscle strength grossly intact  PSYCH: mood and affect congruent    IMAGING:    CXR (05/02/20): Images not available for review. Per report, Visualized cardiac and mediastinal contours within normal limits allowing for mediastinal shift to the right. Circumferential right pleural fluid/thickening. Volume loss in the right lung consistent with known right lung resection. Irregular opacities throughout both lungs, right greater than left, consistent with known bronchiectasis.   ??  Chest CT (11/10/19): Images personally reviewed. Stable sequelae of right middle lobectomy and right lower lobe wedge resection. Stable consolidation in the medial posterior right upper lobe and right lung apex, bilateral diffuse extensive bronchiectasis and bronchial wall thickening and unchanged sequelae of chronic endobronchial infection.    I personally spent 35 minutes face-to-face and non-face-to-face in the care of this patient, which includes all pre, intra, and post visit time on the date of service.    Darrin Nipper, MD  [facility]

## 2020-09-25 DIAGNOSIS — R000 Tachycardia, unspecified: Secondary | ICD-10-CM | POA: Diagnosis not present

## 2020-09-25 DIAGNOSIS — R1033 Periumbilical pain: Secondary | ICD-10-CM | POA: Diagnosis not present

## 2020-09-25 DIAGNOSIS — J479 Bronchiectasis, uncomplicated: Secondary | ICD-10-CM | POA: Diagnosis not present

## 2020-09-26 DIAGNOSIS — J9610 Chronic respiratory failure, unspecified whether with hypoxia or hypercapnia: Secondary | ICD-10-CM | POA: Diagnosis not present

## 2020-09-26 DIAGNOSIS — R0600 Dyspnea, unspecified: Secondary | ICD-10-CM | POA: Diagnosis not present

## 2020-09-26 DIAGNOSIS — R0602 Shortness of breath: Secondary | ICD-10-CM | POA: Diagnosis not present

## 2020-09-26 DIAGNOSIS — J479 Bronchiectasis, uncomplicated: Secondary | ICD-10-CM | POA: Diagnosis not present

## 2020-09-27 DIAGNOSIS — R0600 Dyspnea, unspecified: Secondary | ICD-10-CM | POA: Diagnosis not present

## 2020-09-27 DIAGNOSIS — J479 Bronchiectasis, uncomplicated: Secondary | ICD-10-CM | POA: Diagnosis not present

## 2020-09-27 DIAGNOSIS — R0602 Shortness of breath: Secondary | ICD-10-CM | POA: Diagnosis not present

## 2020-09-27 DIAGNOSIS — J9610 Chronic respiratory failure, unspecified whether with hypoxia or hypercapnia: Secondary | ICD-10-CM | POA: Diagnosis not present

## 2020-09-30 DIAGNOSIS — J479 Bronchiectasis, uncomplicated: Secondary | ICD-10-CM | POA: Diagnosis not present

## 2020-09-30 DIAGNOSIS — J9610 Chronic respiratory failure, unspecified whether with hypoxia or hypercapnia: Secondary | ICD-10-CM | POA: Diagnosis not present

## 2020-09-30 DIAGNOSIS — G4733 Obstructive sleep apnea (adult) (pediatric): Secondary | ICD-10-CM | POA: Diagnosis not present

## 2020-09-30 DIAGNOSIS — R0600 Dyspnea, unspecified: Secondary | ICD-10-CM | POA: Diagnosis not present

## 2020-09-30 DIAGNOSIS — J471 Bronchiectasis with (acute) exacerbation: Secondary | ICD-10-CM | POA: Diagnosis not present

## 2020-09-30 DIAGNOSIS — R0602 Shortness of breath: Secondary | ICD-10-CM | POA: Diagnosis not present

## 2020-10-02 ENCOUNTER — Other Ambulatory Visit: Payer: Self-pay | Admitting: Family Medicine

## 2020-10-02 ENCOUNTER — Other Ambulatory Visit: Payer: Medicare Other

## 2020-10-02 DIAGNOSIS — R0600 Dyspnea, unspecified: Secondary | ICD-10-CM | POA: Diagnosis not present

## 2020-10-02 DIAGNOSIS — R0602 Shortness of breath: Secondary | ICD-10-CM | POA: Diagnosis not present

## 2020-10-02 DIAGNOSIS — J479 Bronchiectasis, uncomplicated: Secondary | ICD-10-CM | POA: Diagnosis not present

## 2020-10-02 DIAGNOSIS — R1033 Periumbilical pain: Secondary | ICD-10-CM

## 2020-10-02 DIAGNOSIS — J9610 Chronic respiratory failure, unspecified whether with hypoxia or hypercapnia: Secondary | ICD-10-CM | POA: Diagnosis not present

## 2020-10-04 DIAGNOSIS — J9610 Chronic respiratory failure, unspecified whether with hypoxia or hypercapnia: Secondary | ICD-10-CM | POA: Diagnosis not present

## 2020-10-04 DIAGNOSIS — R0602 Shortness of breath: Secondary | ICD-10-CM | POA: Diagnosis not present

## 2020-10-04 DIAGNOSIS — R0600 Dyspnea, unspecified: Secondary | ICD-10-CM | POA: Diagnosis not present

## 2020-10-04 DIAGNOSIS — J479 Bronchiectasis, uncomplicated: Secondary | ICD-10-CM | POA: Diagnosis not present

## 2020-10-07 DIAGNOSIS — R0602 Shortness of breath: Secondary | ICD-10-CM | POA: Diagnosis not present

## 2020-10-07 DIAGNOSIS — J479 Bronchiectasis, uncomplicated: Secondary | ICD-10-CM | POA: Diagnosis not present

## 2020-10-07 DIAGNOSIS — J9610 Chronic respiratory failure, unspecified whether with hypoxia or hypercapnia: Secondary | ICD-10-CM | POA: Diagnosis not present

## 2020-10-07 DIAGNOSIS — R0600 Dyspnea, unspecified: Secondary | ICD-10-CM | POA: Diagnosis not present

## 2020-10-09 DIAGNOSIS — R0602 Shortness of breath: Secondary | ICD-10-CM | POA: Diagnosis not present

## 2020-10-09 DIAGNOSIS — R0600 Dyspnea, unspecified: Secondary | ICD-10-CM | POA: Diagnosis not present

## 2020-10-09 DIAGNOSIS — J9610 Chronic respiratory failure, unspecified whether with hypoxia or hypercapnia: Secondary | ICD-10-CM | POA: Diagnosis not present

## 2020-10-09 DIAGNOSIS — J479 Bronchiectasis, uncomplicated: Secondary | ICD-10-CM | POA: Diagnosis not present

## 2020-10-10 NOTE — Unmapped (Signed)
[facility] Refill Coordination Note    Specialty Medication(s) to be Shipped:   CF/Pulmonary: -[name] Brand (tobramycin 300mg /86mL) inhalation solution    Other medication(s) to be shipped: [facility]     Jordan [name], DOB: 1955-11-12  Phone: [phone] (home)       All above HIPAA information was verified with patient.     Was a Nurse, learning disability used for this call? No    Completed refill call assessment today to schedule patient's medication shipment from the [facility] [phone]).  All relevant notes have been reviewed.     Specialty medication(s) and dose(s) confirmed: Regimen is correct and unchanged.   Changes to medications: Male reports no changes at this time.  Changes to insurance: No  New side effects reported not previously addressed with a pharmacist or physician: None reported  Questions for the pharmacist: No    Confirmed patient received a Conservation officer, historic buildings and a Surveyor, mining with first shipment. The patient will receive a drug information handout for each medication shipped and additional FDA Medication Guides as required.       DISEASE/MEDICATION-SPECIFIC INFORMATION        For CF patients: CF [facility] Active? No-not enrolled    SPECIALTY MEDICATION ADHERENCE     Medication Adherence    Patient reported X missed doses in the last month: 0  Specialty Medication: Tobramycin 300mg /58ml Neb solu  Patient is on additional specialty medications: No  Informant: patient  Reliability of informant: reliable  Reasons for non-adherence: no problems identified  Support network for adherence: family member  Confirmed plan for next specialty medication refill: delivery by pharmacy  Refills needed for supportive medications: not needed        Were doses missed due to medication being on hold? No    Tobramycin 300mg /46ml Neb solu: 0 days of medicine on hand **Cycle starts back on 10/21/2020**    REFERRAL TO PHARMACIST     Referral to the pharmacist: Not needed      [facility]     Shipping address confirmed in Epic.     Delivery Scheduled: Yes, Expected medication delivery date: 10/17/2020.     Medication will be delivered via UPS to the prescription address in Epic WAM.    Lorelei Pont [facility] Specialty Technician

## 2020-10-11 DIAGNOSIS — R0600 Dyspnea, unspecified: Secondary | ICD-10-CM | POA: Diagnosis not present

## 2020-10-11 DIAGNOSIS — J479 Bronchiectasis, uncomplicated: Secondary | ICD-10-CM | POA: Diagnosis not present

## 2020-10-11 DIAGNOSIS — J9610 Chronic respiratory failure, unspecified whether with hypoxia or hypercapnia: Secondary | ICD-10-CM | POA: Diagnosis not present

## 2020-10-11 DIAGNOSIS — R0602 Shortness of breath: Secondary | ICD-10-CM | POA: Diagnosis not present

## 2020-10-14 DIAGNOSIS — J9610 Chronic respiratory failure, unspecified whether with hypoxia or hypercapnia: Secondary | ICD-10-CM | POA: Diagnosis not present

## 2020-10-14 DIAGNOSIS — J479 Bronchiectasis, uncomplicated: Secondary | ICD-10-CM | POA: Diagnosis not present

## 2020-10-14 DIAGNOSIS — R0600 Dyspnea, unspecified: Secondary | ICD-10-CM | POA: Diagnosis not present

## 2020-10-14 DIAGNOSIS — R0602 Shortness of breath: Secondary | ICD-10-CM | POA: Diagnosis not present

## 2020-10-15 DIAGNOSIS — L570 Actinic keratosis: Secondary | ICD-10-CM | POA: Diagnosis not present

## 2020-10-15 DIAGNOSIS — L821 Other seborrheic keratosis: Secondary | ICD-10-CM | POA: Diagnosis not present

## 2020-10-15 DIAGNOSIS — D485 Neoplasm of uncertain behavior of skin: Secondary | ICD-10-CM | POA: Diagnosis not present

## 2020-10-15 DIAGNOSIS — L820 Inflamed seborrheic keratosis: Secondary | ICD-10-CM | POA: Diagnosis not present

## 2020-10-15 DIAGNOSIS — D225 Melanocytic nevi of trunk: Secondary | ICD-10-CM | POA: Diagnosis not present

## 2020-10-15 DIAGNOSIS — R000 Tachycardia, unspecified: Secondary | ICD-10-CM | POA: Insufficient documentation

## 2020-10-15 DIAGNOSIS — D1801 Hemangioma of skin and subcutaneous tissue: Secondary | ICD-10-CM | POA: Diagnosis not present

## 2020-10-15 DIAGNOSIS — Z85828 Personal history of other malignant neoplasm of skin: Secondary | ICD-10-CM | POA: Diagnosis not present

## 2020-10-15 DIAGNOSIS — L812 Freckles: Secondary | ICD-10-CM | POA: Diagnosis not present

## 2020-10-15 NOTE — Progress Notes (Signed)
Patient referred by Aura Dials, MD for tachycardia  Subjective:   [name], male    DOB: 08-23-1955, 65 y.o.   MRN: [id number]   Chief Complaint  Patient presents with   Tachycardia   Coronary Artery Disease     HPI  65 y.o. Caucasian male with hypertension, hyperlipidemia, OSA, bronchiectasis, now with irregular heart rhythm  Patient has had bronchiectasis since 2012.  He required supplemental oxygen 1 to 2 L, at night, and during exertion.  He has been working with pulmonary rehab where he walks up to a mile and a half, and does not require oxygen.  He has noticed episodes of tachycardia and possible A. fib on [employer] monitor he uses during pulmonary rehab.  He has occasionally felt palpitation episodes lasting only for 5 seconds or so.  He denies any chest pain.  He does not smoke, does not drink alcohol, drinks 1 cup of coffee every day.  He does not have known OSA.   Past Medical History:  Diagnosis Date   Bronchiectasis ([place])    Bronchiolitis    Dizziness and giddiness 07/26/2012   none recent   DJD (degenerative joint disease)    back and neck   Dyspnea    with walking long distances   Gastroesophageal reflux disease    History of kidney stones yrs ago   passed on own   HTN (hypertension)    OSA (obstructive sleep apnea)    none since 30 lb weight loss   Pneumonia last time 2017   Pneumonia with lung abscess ([facility])    PONV (postoperative nausea and vomiting)    likes scopolamine patch, trouble turning neck to left   Prostate cancer ([place]) dx Aug 12, 2017   Tear of left rotator cuff    healed on own     Past Surgical History:  Procedure Laterality Date   APPENDECTOMY  1969   BACK SURGERY     multiple, DJD, cervical fusion, plate removed   CARPAL TUNNEL RELEASE Right 1999   COLONOSCOPY  12/1980   bleeding from penis and anus after beach trip; colonoscopy and removed urinary stricture at same time   KNEE ARTHROSCOPY     left   LUNG SURGERY   2012,    removed right middle lobe,due to birth defect, removed half of right bottom lobe due to abcess   LUNG SURGERY  07/2013   removed bottom right middle and lower lobe   LYMPHADENECTOMY Bilateral 12/08/2017   Procedure: LYMPHADENECTOMY;  Surgeon: Alexis Frock, MD;  Location: [facility];  Service: Urology;  Laterality: Bilateral;   NECK SURGERY  [id number], 2002   OTHER SURGICAL HISTORY  2016   removed 4" of lower right rib   OTHER SURGICAL HISTORY     has had 8 PICC lines placed over the course of several years.   Right video-assisted thoracoscopic surgery,  12/05/2010   ROBOT ASSISTED LAPAROSCOPIC RADICAL PROSTATECTOMY N/A 12/08/2017   Procedure: XI ROBOTIC ASSISTED LAPAROSCOPIC RADICAL PROSTATECTOMY;  Surgeon: Alexis Frock, MD;  Location: [facility];  Service: Urology;  Laterality: N/A;   TONSILLECTOMY  1965   VIDEO BRONCHOSCOPY     Dr Arlyce Dice     Social History   Tobacco Use  Smoking Status Never  Smokeless Tobacco Never    Social History   Substance and Sexual Activity  Alcohol Use No   Comment: quit in 1994     Family History  Problem Relation Age of Onset   Stroke  Mother    Alzheimer's disease Mother    Transient ischemic attack Mother    Chorea Mother    Liver disease Father    Diabetes Father    Congestive Heart Failure Father    Other Other        sibling with possible right middle lobe issues   Ataxia Brother    Ataxia Maternal Aunt    Prostate cancer Maternal Grandfather    Prostate cancer Cousin    Prostate cancer Cousin    Prostate cancer Cousin      Current Outpatient Medications on File Prior to Visit  Medication Sig Dispense Refill   acidophilus (RISAQUAD) CAPS capsule Take 2 capsules by mouth daily. 60 capsule 0   albuterol (PROVENTIL HFA;VENTOLIN HFA) 108 (90 Base) MCG/ACT inhaler Inhale 2 puffs into the lungs every 4 (four) hours as needed for wheezing or shortness of breath.      albuterol (PROVENTIL) (2.5 MG/3ML) 0.083% nebulizer solution  Inhale 2.5 mg into the lungs 4 (four) times daily.      ascorbic acid (VITAMIN C) 250 MG CHEW Chew 1,000 mg by mouth daily.      azithromycin (ZITHROMAX) 250 MG tablet Take 1 tablet (250 mg total) by mouth daily.  0   budesonide-formoterol (SYMBICORT) 160-4.5 MCG/ACT inhaler Inhale 2 puffs into the lungs 2 (two) times daily.     Cholecalciferol 25 MCG (1000 UT) tablet Take 1,000 Units by mouth daily.     Colistimethate Sodium POWD Take 2 mLs by nebulization 2 (two) times daily. Use only every other month. 176m injection.     dornase alpha (PULMOZYME) 1 MG/ML nebulizer solution Take 2.5 mg by nebulization daily. Use at least 30-60 minutes before airway clearance or after airway clearance     doxylamine, Sleep, (UNISOM) 25 MG tablet Take 25 mg by mouth at bedtime.     ferrous sulfate 325 (65 FE) MG tablet Take 325 mg by mouth 2 (two) times daily.     fluticasone (FLONASE) 50 MCG/ACT nasal spray Place 2 sprays into both nostrils daily.     HYDROcodone-acetaminophen (NORCO) 7.5-325 MG tablet Take 1 tablet by mouth every 6 (six) hours as needed for moderate pain. (Patient taking differently: Take 1 tablet by mouth 3 (three) times daily. ) 20 tablet 0   HYDROcodone-homatropine (HYCODAN) 5-1.5 MG/5ML syrup Take 5 mLs by mouth every 6 (six) hours as needed for cough. (Patient taking differently: Take 5 mLs by mouth every 4 (four) hours as needed for cough. ) 240 mL 0   montelukast (SINGULAIR) 10 MG tablet Take 10 mg by mouth at bedtime.     Multiple Vitamin (MULTIVITAMIN WITH MINERALS) TABS tablet Take 1 tablet by mouth daily.     NON FORMULARY Use Acapella twice daily for airway clearance. Dx. 494.0. Mucous clearing device.     omeprazole (PRILOSEC) 20 MG capsule Take 20 mg by mouth 2 (two) times daily.      Respiratory Therapy Supplies (FLUTTER) DEVI Use several times daily as needed for congestion/thick mucus     Sodium Chloride 10 % NEBU Inhale 5 mLs into the lungs 2 (two) times daily.     tobramycin,  PF, ([name]) 300 MG/5ML nebulizer solution Take 300 mg by nebulization 2 (two) times daily. Use only every other month     Water For Irrigation, Sterile (STERILE WATER FOR IRRIGATION) 4 mLs daily. Mix 430mwith colistimethate powder to use for nebulization. 28 days on and 28 days off.     No  current facility-administered medications on file prior to visit.    Cardiovascular and other pertinent studies:  EKG 10/16/2020: Sinus rhythm 75 bpm Normal EKG   Recent labs: 07/09/2020: Glucose 85, BUN/Cr 27/1.15. EGFR 70. Na/K 140/4.9. Rest of the CMP normal H/H 13/41. MCV 92. Platelets 172 HbA1C N/A Chol 217, TG 187, HDL 43, LDL 140 TSH N/A  10/08/2019: Glucose 95, BUN/Cr 22/1.25. EGFR >60. Na/K 139/3.7.  H/H 12/37. MCV 94. Platelets 145    Review of Systems  Cardiovascular:  Positive for palpitations. Negative for chest pain, dyspnea on exertion, leg swelling and syncope.        Vitals:   10/16/20 0843  BP: 125/78  Pulse: 77  Temp: 98 F (36.7 C)  SpO2: 93%     Body mass index is 28.43 kg/m. Filed Weights   10/16/20 0843  Weight: 187 lb (84.8 kg)     Objective:   Physical Exam Vitals and nursing note reviewed.  Constitutional:      General: He is not in acute distress. Neck:     Vascular: No JVD.  Cardiovascular:     Rate and Rhythm: Normal rate and regular rhythm.     Heart sounds: Normal heart sounds. No murmur heard. Pulmonary:     Effort: Pulmonary effort is normal.     Breath sounds: Normal breath sounds. No wheezing or rales.  Musculoskeletal:     Right lower leg: No edema.     Left lower leg: No edema.        Assessment & Recommendations:   65 y.o. Caucasian male with hypertension, hyperlipidemia, OSA, bronchiectasis, now with irregular heart rhythm  Irregular heart rhythm: Reviewed single-lead telemetry strips from: Monitor.  He has episodes of narrow complex tachycardia in low 100s, probable sinus tachycardia, but cannot exclude atrial flutter.   I recommend echocardiogram and 2-week cardiac telemetry for further evaluation.  Further recommendations after the above testing  Thank you for referring the patient to [place]. Please feel free to contact with any questions.   Nigel Mormon, MD Pager: [phone] Office: [phone]

## 2020-10-16 ENCOUNTER — Ambulatory Visit: Payer: Medicare Other | Admitting: Cardiology

## 2020-10-16 ENCOUNTER — Encounter: Payer: Self-pay | Admitting: Cardiology

## 2020-10-16 ENCOUNTER — Inpatient Hospital Stay: Payer: Medicare Other

## 2020-10-16 ENCOUNTER — Other Ambulatory Visit: Payer: Self-pay

## 2020-10-16 VITALS — BP 125/78 | HR 77 | Temp 98.0°F | Ht 68.0 in | Wt 187.0 lb

## 2020-10-16 DIAGNOSIS — I499 Cardiac arrhythmia, unspecified: Secondary | ICD-10-CM | POA: Diagnosis not present

## 2020-10-16 DIAGNOSIS — R000 Tachycardia, unspecified: Secondary | ICD-10-CM

## 2020-10-16 DIAGNOSIS — R0600 Dyspnea, unspecified: Secondary | ICD-10-CM | POA: Diagnosis not present

## 2020-10-16 DIAGNOSIS — J9610 Chronic respiratory failure, unspecified whether with hypoxia or hypercapnia: Secondary | ICD-10-CM | POA: Diagnosis not present

## 2020-10-16 DIAGNOSIS — R0602 Shortness of breath: Secondary | ICD-10-CM | POA: Diagnosis not present

## 2020-10-16 DIAGNOSIS — J479 Bronchiectasis, uncomplicated: Secondary | ICD-10-CM | POA: Diagnosis not present

## 2020-10-16 MED FILL — TOBRAMYCIN 300 MG/5 ML IN 0.225 % SODIUM CHLORIDE FOR NEBULIZATION: RESPIRATORY_TRACT | 56 days supply | Qty: 280 | Fill #4

## 2020-10-16 MED FILL — LC PLUS MISC: 30 days supply | Qty: 1 | Fill #3

## 2020-10-17 DIAGNOSIS — R0602 Shortness of breath: Secondary | ICD-10-CM | POA: Diagnosis not present

## 2020-10-17 DIAGNOSIS — R0600 Dyspnea, unspecified: Secondary | ICD-10-CM | POA: Diagnosis not present

## 2020-10-17 DIAGNOSIS — J479 Bronchiectasis, uncomplicated: Secondary | ICD-10-CM | POA: Diagnosis not present

## 2020-10-17 DIAGNOSIS — J9610 Chronic respiratory failure, unspecified whether with hypoxia or hypercapnia: Secondary | ICD-10-CM | POA: Diagnosis not present

## 2020-10-18 DIAGNOSIS — J9610 Chronic respiratory failure, unspecified whether with hypoxia or hypercapnia: Secondary | ICD-10-CM | POA: Diagnosis not present

## 2020-10-18 DIAGNOSIS — R0600 Dyspnea, unspecified: Secondary | ICD-10-CM | POA: Diagnosis not present

## 2020-10-18 DIAGNOSIS — J479 Bronchiectasis, uncomplicated: Secondary | ICD-10-CM | POA: Diagnosis not present

## 2020-10-18 DIAGNOSIS — R0602 Shortness of breath: Secondary | ICD-10-CM | POA: Diagnosis not present

## 2020-10-21 ENCOUNTER — Telehealth: Payer: Self-pay | Admitting: Cardiology

## 2020-10-21 DIAGNOSIS — R0600 Dyspnea, unspecified: Secondary | ICD-10-CM | POA: Diagnosis not present

## 2020-10-21 DIAGNOSIS — J479 Bronchiectasis, uncomplicated: Secondary | ICD-10-CM | POA: Diagnosis not present

## 2020-10-21 DIAGNOSIS — J9610 Chronic respiratory failure, unspecified whether with hypoxia or hypercapnia: Secondary | ICD-10-CM | POA: Diagnosis not present

## 2020-10-21 DIAGNOSIS — R0602 Shortness of breath: Secondary | ICD-10-CM | POA: Diagnosis not present

## 2020-10-21 NOTE — Telephone Encounter (Signed)
8/1 Pt is scheduled to have a CT on 8/4, he was wondering if that was safe to do with the monitor on. Would like a call back to confirm. [facility]

## 2020-10-22 DIAGNOSIS — G4733 Obstructive sleep apnea (adult) (pediatric): Secondary | ICD-10-CM | POA: Diagnosis not present

## 2020-10-22 DIAGNOSIS — J471 Bronchiectasis with (acute) exacerbation: Secondary | ICD-10-CM | POA: Diagnosis not present

## 2020-10-22 DIAGNOSIS — J479 Bronchiectasis, uncomplicated: Secondary | ICD-10-CM | POA: Diagnosis not present

## 2020-10-22 NOTE — Telephone Encounter (Signed)
Called patient, he will remove the heart monitor for his CT, and will mail it in.

## 2020-10-22 NOTE — Telephone Encounter (Signed)
Yes

## 2020-10-22 NOTE — Telephone Encounter (Signed)
He will have had it for a week and 1 day by the time he has to get his CT, is it okay for it to be taken off for the test?

## 2020-10-23 DIAGNOSIS — R0602 Shortness of breath: Secondary | ICD-10-CM | POA: Diagnosis not present

## 2020-10-23 DIAGNOSIS — J479 Bronchiectasis, uncomplicated: Secondary | ICD-10-CM | POA: Diagnosis not present

## 2020-10-23 DIAGNOSIS — R0600 Dyspnea, unspecified: Secondary | ICD-10-CM | POA: Diagnosis not present

## 2020-10-23 DIAGNOSIS — J9610 Chronic respiratory failure, unspecified whether with hypoxia or hypercapnia: Secondary | ICD-10-CM | POA: Diagnosis not present

## 2020-10-24 ENCOUNTER — Ambulatory Visit
Admission: RE | Admit: 2020-10-24 | Discharge: 2020-10-24 | Disposition: A | Discharge status: Home / Self Care | Payer: Medicare Other | Source: Ambulatory Visit | Attending: Family Medicine | Admitting: Family Medicine

## 2020-10-24 DIAGNOSIS — N200 Calculus of kidney: Secondary | ICD-10-CM | POA: Diagnosis not present

## 2020-10-24 DIAGNOSIS — R1033 Periumbilical pain: Secondary | ICD-10-CM

## 2020-10-24 DIAGNOSIS — J479 Bronchiectasis, uncomplicated: Secondary | ICD-10-CM | POA: Diagnosis not present

## 2020-10-24 DIAGNOSIS — R918 Other nonspecific abnormal finding of lung field: Secondary | ICD-10-CM | POA: Diagnosis not present

## 2020-10-24 MED ORDER — IOPAMIDOL (ISOVUE-300) INJECTION 61%
100.0000 mL | Freq: Once | INTRAVENOUS | Status: AC | PRN
  Administered 2020-10-24: 100 mL via INTRAVENOUS

## 2020-10-25 DIAGNOSIS — R0600 Dyspnea, unspecified: Secondary | ICD-10-CM | POA: Diagnosis not present

## 2020-10-25 DIAGNOSIS — J9610 Chronic respiratory failure, unspecified whether with hypoxia or hypercapnia: Secondary | ICD-10-CM | POA: Diagnosis not present

## 2020-10-25 DIAGNOSIS — R0602 Shortness of breath: Secondary | ICD-10-CM | POA: Diagnosis not present

## 2020-10-25 DIAGNOSIS — J479 Bronchiectasis, uncomplicated: Secondary | ICD-10-CM | POA: Diagnosis not present

## 2020-10-28 DIAGNOSIS — J479 Bronchiectasis, uncomplicated: Secondary | ICD-10-CM | POA: Diagnosis not present

## 2020-10-28 DIAGNOSIS — J9610 Chronic respiratory failure, unspecified whether with hypoxia or hypercapnia: Secondary | ICD-10-CM | POA: Diagnosis not present

## 2020-10-28 DIAGNOSIS — I499 Cardiac arrhythmia, unspecified: Secondary | ICD-10-CM | POA: Diagnosis not present

## 2020-10-28 DIAGNOSIS — R000 Tachycardia, unspecified: Secondary | ICD-10-CM | POA: Diagnosis not present

## 2020-10-28 DIAGNOSIS — R0602 Shortness of breath: Secondary | ICD-10-CM | POA: Diagnosis not present

## 2020-10-28 DIAGNOSIS — R0600 Dyspnea, unspecified: Secondary | ICD-10-CM | POA: Diagnosis not present

## 2020-10-29 DIAGNOSIS — R0602 Shortness of breath: Secondary | ICD-10-CM | POA: Diagnosis not present

## 2020-10-29 DIAGNOSIS — R0600 Dyspnea, unspecified: Secondary | ICD-10-CM | POA: Diagnosis not present

## 2020-10-29 DIAGNOSIS — J9610 Chronic respiratory failure, unspecified whether with hypoxia or hypercapnia: Secondary | ICD-10-CM | POA: Diagnosis not present

## 2020-10-29 DIAGNOSIS — J479 Bronchiectasis, uncomplicated: Secondary | ICD-10-CM | POA: Diagnosis not present

## 2020-10-30 DIAGNOSIS — R0600 Dyspnea, unspecified: Secondary | ICD-10-CM | POA: Diagnosis not present

## 2020-10-30 DIAGNOSIS — J479 Bronchiectasis, uncomplicated: Secondary | ICD-10-CM | POA: Diagnosis not present

## 2020-10-30 DIAGNOSIS — R0602 Shortness of breath: Secondary | ICD-10-CM | POA: Diagnosis not present

## 2020-10-30 DIAGNOSIS — J9610 Chronic respiratory failure, unspecified whether with hypoxia or hypercapnia: Secondary | ICD-10-CM | POA: Diagnosis not present

## 2020-10-30 NOTE — Unmapped (Signed)
[facility]    Assessment:      Patient:Jordan [name] (12/17/1955)    Mr. Matulich is a 65 y.o. male who is seen for follow up of bronchiectasis with chronic Pseudomonas colonization and chronic hypoxemic respiratory failure requiring supplemental oxygen with exertion and sleep. No exacerbations since last visit. Notable improvement in exercise tolerance and FVC with pulmonary rehab.     Plan:      Problem List Items Addressed This Visit        Respiratory    Bronchiectasis (CMS-HCC) - Primary    Relevant Orders    AFB SMEAR    Flow volume loop    Chronic respiratory failure with hypoxia (CMS-HCC)       Other    Pseudomonas aeruginosa infection      Therapies:  ?? Patient was unable to meet with the respiratory therapist today. She will contact patient to follow up regarding home oxygen and his planned trip to [place] [employer] next month (driving there).  ?? No changes were made to airway clearance regimen.   ?? No changes were made to inhaled medications.  ?? Rather than reducing oxygen flow with activity as Deven asked, recommended increasing speed or incline intermittently.    Testing:  ??? Patient spontaneously expectorated sputum that was sent for bacterial and AFB cultures. These results will help to guide antibiotic therapy in the future during exacerbations.  ??? If he has future exacerbations, sputum should be sent for bacterial and AFB cultures. The bacterial culture should be processed like a cystic fibrosis sample given the overlapping pathogens. There are standing orders for these cultures in our system.    Referrals:  ??? None     Vaccinations:  ??? COVID-19: UTD  ??? Flu: Recommended in fall  ??? Pneumococcal: UTD  ??? Tdap: UTD    Cardale will return to clinic in 4 months for follow-up with pre-bronchodilator spirometry and sputum cultures. Will coordinate with follow up with Dr. Molli Hazard.  He will call or send me a message via MyChart if questions or concerns arise before this visit. Huxley is in agreement with the above plan.    I personally spent 47 minutes face-to-face and non-face-to-face in the care of this patient, which includes all pre, intra, and post visit time on the date of service.     Subjective:      HPI: [name] is a 65 y.o. male who is seen for follow up of bronchiectasis with PsA colonization and prior history of multiple lung resections.    09/27/18:  I last saw him on 05/17/18. He reached out at the end of May reporting symptoms c/w an exacerbation. I treated him with a two week course of Ciprofloxacin, which seemed to help.  Reports that he feels winded too much - if walks up incline or up a couple hundred feet. This has worsened over the past year. Has to stop and let lungs catch up.  Of note, he has been off testosterone supplementation for the past year after he was diagnosed with prostate cancer.    He received the [facility], which he is using with nebulized HTS 7% and Pulmozyme. After using the Vest for 6-8 weeks, he noticed improvement with more phlegm mobilization. Sometimes has to pause it to cough to get stuff up.  Plugging feeling happening a lot more, especially with the vest.  Most of the time, sputum is thinned out with Pulmozyme but still gets those plugs.  Had to stop eating ice cream or drinking milkshake - will cough will eating and that makes him vomit on occasion. Past few weeks, when eating will cough, no matter what he eats.  Happens almost once a day. Doesn't feel like things get stuck typically.      02/07/19:  Following his last visit, Kori reached out regarding portable oxygen.  We had considered the life 2000 but he does not qualify because he does not use supplemental oxygen.  He performed the 6-minute walk test prior to today's visit but did not desaturate.  He wonders if this is because the test was done in the morning and he doesn't have breathing issues in the morning.  Typically, breathing issues occur in afternoon.  During these episodes, he will have to use his inhaler and feels like he is suffocating.  He describes it as an uncomfortable feeling that limits his ability to do things with family.  If he is having trouble catching breath, oximetry is 92-93%. Has seen it in the 80s if comes in from outside and is short of breath.    For airway clearance, he is nebulizing 10% hypertonic saline and using his percussive vest for 20 minute sessions in AM and PM. Has to pause it to cough and get stuff up.  Coughing up more stuff since last visit and is waking up 1-2 times a night with cough.having coughing fits between clearance session.  What he expectorates is less of a mucous plug and mostly stringy sputum that is yellowish to light brown in color.  No hemoptysis.  On Saturday, he had a coughing fit that nearly triggered posttussive emesis.  He only expectorated about 3-4 globs of phlegm.  Nebulizing Pulmozyme daily.  Alternates [name] and colistin every other month.  On [name] this month and will start Colistin on Dec 1.    06/06/19:  Coughing more and more sputum.  Waking up 1-2 times per night coughing. Can cough once or just keep coughing.  Sputum is yellow in color without evidence of blood.  He is not expectorating green sputum currently. On colistin this month; alternates with [name]. Went through January off all inhaled antibiotics.  Oxygen levels in the low 90s.  Uses rescue inhaler if goes up stairs.  Regularly performing airway clearance with nebulized 10% hypertonic saline twice a day, percussive vest twice a day, and Pulmozyme daily. He last received IV antibiotics in January 2019 but has been treated with oral antibiotics on 04/19/18 and 08/17/18, 03/10/19.      11/10/19:  1-2 times per week can get sharp right sided chest pain - even with nipple line. Can happen with clearance or activity or rest. Never during sleep. Following his hospitalization, he first experienced it in June. Lasts only about 10 seconds. Not reproducible. Limited activity due to dyspnea. Regularly performing airway clearance with nebulized 10% hypertonic saline twice a day, percussive vest twice a day, and Pulmozyme daily. Remains on chronic azithromycin therapy.    02/13/20:  Showed up to [facility] with oxygen saturations of 87-88% after walking from parking lot (<100 yd) and up incline without supplemental oxygen.  Using 2L continuous flow with exertion and sleep.  Had sleep study done at [facility] on RA and was told that he should be fine with just the supplemental oxygen. I do not have this report. Using oxygen when goes to football game; needs long period to recover from activity.  Going up 20 foot incline with oxygen,  will have to stop and catch breath.     Wheezing more lately and endorses chest tightness. Has need to use albuterol as rescue 3-4 times per day.  Used albuterol in the car on the way here. Typically would be able to cough up large amount of mucus after PFTs but only coughed up small amount. Feels like mucus is stuck in lungs despite twice daily airway clearance with 10% HTS and Vest. Also using Pulmozyme once a day and Symbicort twice a day after clearance.  Using colistin this month. He ordered the [name] nebs to use next month even though insurance won't cover it. Feels like inhaled antibiotics definitely help.    05/10/20:   Saw [facility] for lung transplant evaluation.  Felt to be poor candidate due to prior surgeries and primary disease.  Candidacy closed at [facility]. Referral sent to [facility] and [place] on 2/15.  Based on labs and testing done there, getting Hep B vaccination series (not Hep A or Hep B immune).  Also getting DEXA that was ordered by Dr. Abigail Miyamoto.    Had PSG done at [facility] - no evidence of OSA but does have increased upper airway resistance.  He never sleeps on his back; only sleeps on side or stomach.   Fatigue is most burdensome symptom.    Regrets that he let the surgeon operate on his lung before he met me.  He completed pulmonary rehab at [facility] but they only had him on machine for ~30 minutes and then stretching.  No strength exercises. He is planning on joining a gym based on recommendations from [facility] transplant.  Was surprised that he didn't desaturate during his walk test at [facility].    07/02/20:  At last visit, referred him to [facility] for pulmonary rehab. Found to have osteoporosis in hips. Turned down for transplant at [facility] based on chart review.  No change in breathing or cough.  Every once in a while, has bad coughing spells.  Same sputum volume and color. Globby to stringy. Not dark brown or bloody.  Sputum is light brown to yellowish.  Breathing about the same.  Started on 1200 mg calcium for osteoporosis.  Starting telehealth pulmonary rehab - feels optimistic that it will help him do more.    10/31/20:  Heart monitor for pulm rehab - had 40 events of fast rate or afib.  Has felt heart fluttering.  Did event monitor for 8 days.  Getting echocardiogram on Wednesday and then seeing cardiologist the next week.     Was having sharp pains over right abdomen below ribs.  Once a month, now once every 2 weeks.  Respiratory Symptoms:   ??? Cough: productive of dark beige sputum.  Volume has lessened overall.  No chest congestion.  ??? Nocturnal awakenings: absent.  Perhaps once a month.  ??? Wheezing: absent.  ??? Chest tightness: absent.  ??? Rescue albuterol use: Not in months.  ??? Pleurisy: present seldom.  ??? Hemoptysis: absent.  ??? MMRC: 1 = I get short of breath when hurrying on level ground or walking up a slight hill.    Exacerbations:   ??? Number of exacerbations treated in the past year: 1  ??? Dates of exacerbations: 03/2018, 07/2018, 02/2019, 09/2019, 12/2019  ??? Number of hospitalizations for exacerbations in the past 2 years: 1  ??? Dates of hospitalizations: 01/2015, 03/2015, 01/2016, 03/2017, 09/2019    Pulmonary Therapies:   ??? Airway clearance: Pulmozyme daily, HTS 10% bid, percussive vest bid  ???  Inhaled antibiotics: Alternates [name] and Colistin.  ??? Inhalers: Symbicort 160/4.5, Spiriva Respimat  ??? Chronic antibiotics: azithromycin 250 mg daily  ??? Exercise: Walking 3 miles at 2.1 without stopping on the treadmill.  ??? Pulmonary Rehab: Telehealth pulmonary rehab July 2022  ??? Supplemental oxygen: 2L continuous and with sleep.  ??? NIPPV: No    Past Medical History:   Diagnosis Date   ??? Abscess of lung (CMS-HCC) 11/06/2010    CT Chest 10/22/10 12/05/2010 right thoracotomy resection of right middle lobe and resection of right lower lobe abscess    ??? Arthritis     back   ??? Biceps tendon tear 2013    left side   ??? Bronchiectasis (CMS-HCC)     chronic psuedomonas infection   ??? Degenerative joint disease of left acromioclavicular joint    ??? GERD (gastroesophageal reflux disease)    ??? Hypertension    ??? Obstructive sleep apnea on CPAP 03/14/13    AHI 33.5, on BiPAP 12/8 based on sleep study 09/2013   ??? Pneumonia 2012    Lung abcess    ??? PONV (postoperative nausea and vomiting)    ??? Prostate cancer (CMS-HCC) 09/09/2017   ??? Rotator cuff injury left   ??? Vertigo        Past Surgical History:   Procedure Laterality Date   ??? APPENDECTOMY  1969   ??? BACK SURGERY     ??? BRONCHOSCOPY  10/19/2010    [facility]   ??? CARPAL TUNNEL RELEASE  1999   ??? LUNG REMOVAL, PARTIAL Right 10/2010    RML and RLL partially resected   ??? MENISCECTOMY  2011   ??? NECK SURGERY  1993, 1999, 2002   ??? PR GERD TST W/ MUCOS IMPEDE ELECTROD,>1HR N/A 07/06/2012    Procedure: ESOPHAGEAL FUNCTION TEST, GASTROESOPHAGEAL REFLUX TEST W/ NASAL CATHETER INTRALUMINAL IMPEDANCE ELECTRODE(S) PLACEMENT, RECORDING, ANALYSIS AND INTERPRETATION; PROLONGED;  Surgeon: None None;  [facility];  Service: Gastroenterology   ??? PR OPEN TREAT RIB FRACTURE W/INT FIXATION, UNILATERAL, 1-2 RIBS Right 03/19/2015    Procedure: OPEN TREATMENT OF RIB FRACTURE REQUIRING INTERNAL FIXATION, UNILATERAL; 1-2 RIBS;  Surgeon: Evert Kohl, MD;  Location: MAIN OR [facility];  Service: Cardiothoracic   ??? PR THORACOTOMY W/THERAP WEDGE RESEXN ADDL IPSILATRL Right 08/07/2013    Procedure: THORACOTOMY; WITH THERAPEUTIC LOBECTOMY OF RIGHT MIDDLE AND RIGHT LOWER LOBE RESECTION , EACH ADDITIONAL RESECTION, IPSILATERAL;  Surgeon: Alvester Chou, MD;  Location: MAIN OR [facility];  Service: Cardiothoracic   ??? TONSILLECTOMY  1965       Family History   Problem Relation Age of Onset   ??? Alzheimer's disease Mother    ??? Stroke Mother    ??? Bronchiectasis  Brother         presumed   ??? Bronchiectasis  Brother         presumed   ??? Liver disease Father    ??? Diabetes Father    ??? Heart failure Father    ??? Bronchiectasis  Daughter    ??? Asthma Son         childhood asthma   ??? Clotting disorder Neg Hx    ??? Anesthesia problems Neg Hx        Social History     Tobacco Use   ??? Smoking status: Never Smoker   ??? Smokeless tobacco: Never Used   Substance Use Topics   ??? Alcohol use: No     Alcohol/week: 0.0 standard drinks   ???  Drug use: No       Allergies  Reviewed on 10/31/2020      Reactions Comments    Gentamicin Other (See Comments) BALANCE ISSUES    Tobramycin Other (See Comments) ototoxicity    Morphine Nausea And Vomiting         Current Outpatient Medications   Medication Sig Dispense Refill   ??? albuterol 2.5 mg /3 mL (0.083 %) nebulizer solution Inhale 3 mL (2.5 mg total) by nebulization Two (2) times a day. 540 mL 3   ??? albuterol HFA 90 mcg/actuation inhaler Inhale 2 puffs Four (4) times a day. 25.5 g 3   ??? alcohol swabs (ALCOHOL PREP PADS) PadM Use as directed with inhaled antibiotics 100 each 5   ??? ascorbic acid, vitamin C, (VITAMIN C) 1000 MG tablet Take 1,000 mg by mouth daily.     ??? azithromycin (ZITHROMAX) 250 MG tablet Take 1 tablet (250 mg total) by mouth daily. 90 tablet 3   ??? cholecalciferol, vitamin D3-50 mcg, 2,000 unit,, 50 mcg (2,000 unit) cap Take 2,000 Units by mouth.     ??? coenzyme Q10 300 mg cap Take 10-300 mg by mouth.     ??? colistimethate (COLYMYCIN) 150 mg injection Inject 2 mL sterile water for injection to mix colistin vial, then draw up 2 mL (150mg ) and inhale 2 times a day, 28 days on and 28 days off. 60 each 5   ??? dornase alfa (PULMOZYME) 1 mg/mL nebulizer solution Inhale 1 ampule (2.5 mg) daily. Use at least 30-60 minutes before airway clearance, or after airway clearance 225 mL 3   ??? empty container Misc USE AS DIRECTED 1 each 2   ??? ferrous sulfate 325 (65 FE) MG tablet Take 325 mg by mouth Two (2) times a day. BID     ??? HYDROcodone-acetaminophen (NORCO) 7.5-325 mg per tablet Take 1 tablet by mouth three (3) times a day (at 6am, noon and 6pm).     ??? HYDROcodone-homatropine (HYCODAN) 5-1.5 mg/5 mL syrup Take 5 mL by mouth every four (4) hours as needed for cough. 120 mL 0   ??? montelukast (SINGULAIR) 10 mg tablet Take 1 tablet (10 mg total) by mouth nightly. 90 tablet 3   ??? multivitamin-minerals-lutein Tab Take 1 tablet by mouth daily.      ??? nebulizers (LC PLUS) Misc use with Colistin 1 each 5   ??? nebulizers (LC PLUS) Misc Use as directed with hypertonic saline 10% 3 each 3   ??? nebulizers Misc Use as directed with tobramycin 1 each 5   ??? needle, disp, 19 G (BD REGULAR BEVEL NEEDLES) 19 gauge x 1 1/2 Ndle Use as directed with inhaled Colistin 220 each 0   ??? omeprazole (PRILOSEC) 20 MG capsule Take 20 mg by mouth Two (2) times a day.      ??? sodium chloride (BD POSIFLUSH NORMAL SALINE 0.9) 0.9 % injection Inject 2mL of 0.9%NaCl into colistin vial & gently mix. After withdrawing colistin dose, add an additional 1mL of 0.9%NaCl to neb cup with the colistin dose. 180 mL 11   ??? sodium chloride 10 % Nebu Inhale 5 mL by nebulization Two (2) times a day. 900 mL 3   ??? sterile water Soln Use 2mL to mix Colistin,then add additional 2mL to neb cup with 2mL of mixed colistin.Inhale twice daily 28 days on and 28 days off. 600 mL 5   ??? SYMBICORT 160-4.5 mcg/actuation inhaler INHALE 2 PUFFS INTO THE LUNGS 2 TIMES A DAY.  30.6 g 1   ??? syringe with needle (BD LUER-LOK SYRINGE) 3 mL 20 gauge x 1 1/2 Syrg For use with inhaled antibiotic (Colistin) 60 each 30   ??? syringe with needle (BD LUER-LOK SYRINGE) 3 mL 20 gauge x 1 Syrg For use with inhaled antibiotic (Colistin) 60 each 5   ??? tiotropium bromide (SPIRIVA RESPIMAT) 2.5 mcg/actuation inhalation mist Inhale 2 puffs daily. 12 g 3   ??? tobramycin, PF, ([name]) 300 mg/5 mL nebulizer solution Inhale 5 mL (300 mg total) by nebulization every twelve (12) hours. 28 days on and 28 days off. 280 mL 5   ??? zinc gluconate 50 mg (7 mg elemental zinc) tablet Take 50 mg by mouth.       No current facility-administered medications for this visit.     Physical Exam:   BP 121/72  - Pulse 78  - Temp 36.3 ??C (97.4 ??F)  - Ht 174 cm (5' 8.5)  - Wt 83 kg (183 lb)  - SpO2 98%  - BMI 27.42 kg/m??   Well appearing white male in no acute distress, in good spirits. Easy work of breathing without accessory muscle use. Speaking in full sentences.  Soft crackles appreciated over left lower lung field.  Decreased breath sounds over right mid and lower lung fields, consistent with prior resection.  Regular rate and rhythm with normal S1 and S2.  No murmurs, rubs, or gallops.  Clubbing unchanged from prior.  No lower extremity edema.    Diagnostic Review:   The following data were reviewed during this visit with key findings summarized below:  Pulmonary Function Testing:      Spirometry consistent with severe airway obstruction and suggestive of moderate restriction.  FVC improved as compared to prior. Normal inspiratory loop.   FVC (% predicted) FEV1 (% predicted) FEV1/FVC   11/10/2013 3.19 L (68%) 2.16 L (61%)  68%   02/23/2014 3.50 L (76%)  2.23 L (64%)  64%   07/12/2014  2.94 L (66%)  2.02 L (58%)  69%   01/15/2015  2.82 L (62%)  1.79 L (52%)  63%   02/08/2015  3.04 L (66%)  2.06 L (59%)  68%   04/02/2015  2.86 L (63%)  1.83 L (53%)  64%   12/10/2015  2.77 L (62%)  1.62 L (48%)  58%   01/27/2016  2.42 L (54%)  1.55 L (46%)  64%   03/24/2016  3.36 L (75%)  1.99 L (59%)  59%   07/21/2016  2.56 L (57%)  1.54 L (46%)  60%   12/29/2016  2.80 L (63%)  1.57 L (47%)  56%   04/06/2017  2.61 L (59%)  1.46 L (44%)  56%   07/06/2017  2.56 L (58%)  1.49 L (45%)  58%   10/12/2017  2.63 L (59%)  1.47 L (44%)  56%   01/18/2018  2.68 L (63%)  1.36 L (42%)  51%   05/17/2018 2.49 L (57%)  1.25 L (38%)  50%   09/26/2018 2.43 L (57%)  1.26 L (39%)  52%   01/26/2019 2.16 L (51%)  1.16 L (36%)  54%   06/06/2019 2.47 L (59%) 1.11 L (34%) 45%   11/10/19 2.14 L (50%) 1.20 L (37%) 56%   02/13/20 2.01 L (48%) 1.08 L (34%) 54%   05/02/20 2.29 L (54%) 1.11 L (34%) 48%   07/02/20 (pre) 2.11 L (51%) 1.17 L (37%) 56%   07/02/20 (post) 2.26 L (54%) [+7.2%]  1.22 L (38%) [+4.0%] 54%   10/31/20 2.46 L (59%) 1.17 L (37%) 48%     6-minute Walk (12/06/19): Heart Rate: Resting Heart Rate was 71 bpm. Maximum Heart Rate achieved 97 bpm. Patient did reach target heart rate (85-132 bpm).  Heart rate returned to 77 bpm after 2 minutes of recovery.  Oxygen Saturation: Resting O2 Saturation was 94% on RA. O2 saturation nadir was 87% at minute 2. O2 saturation improved to 96% with 2L via [place] at minute 3 and maintained saturations above 88% for remainder of walk. O2 saturation returned to 96% on room air after 2 minutes of recovery.  Distance: Total 6 minute walk distance was 320 m. Predicted 6 minute walk distance 548.9 m (LLN of 395.8 m). Percent of predicted 6 minute walk distance of 58%. Testing included one 5-minute pause. Distance obtained after placed on 2L and walked for an additional 6 minutes.  BORG: Resting BORG score was 1. Maximum BORG score was 5 at minute 2. Concluding BORG score was 0 after 2 minutes of recovery. Impression: Significant exercise induced oxygen desaturation (below 88%). Exertional hypoxemia improved with 2L supplemental oxygen. Walk distance is less than expected suggesting a cardio-respiratory defect -- clinical correlation suggested.  Recommendation: Supplemental oxygen required with exercise at 2L . Consider further evaluation with spirometry, lung volumes, respiratory muscle flow/force measurements, cardiopulmonary stress testing, and/or cardio/vascular studies.    6-Minute Walk (02/15/19): Resting Heart Rate was 91. Maximum Heart Rate achieved 119. Patient did reach target heart rate (86-133). Resting O2 Saturation was 98% on RA. Resting BORG score was 1. O2 saturation nadir was 91% at minute 6. Total 6 minute walk distance was 381 meters. Predicted 6 minute walk distance of 558 m (LLN of 470m). Percent of predicted 6 minute walk distance of 68%. Heart rate returned to 96 bpm after 2 minutes of recovery. O2 saturation returned to 96% on RA after 2 minutes of recovery. Maximum BORG score was 7 at minute 6. Concluding BORG score was 2 after 2 minutes of recovery. No evidence of resting or exercise induced hypoxemia. No supplemental oxygen is required at this time.    6-Minute Walk (01/26/19): Resting Heart Rate was 86. Maximum Heart Rate achieved 97. Patient did reach target heart rate (86-133). Resting O2 Saturation was 99% on RA. Resting BORG score was 1. O2 saturation nadir was 95% at minutes 3 and 6. Total 6 minute walk distance was 350 meters. Predicted 6 minute walk distance of 558 m (LLN of 428m). Percent of predicted 6 minute walk distance of 63%. Heart rate returned to 86 bpm after 2 minutes of recovery. O2 saturation returned to 96% on RA after 2 /minutes of recovery. Maximum BORG score was 6 at minutes 5 and 6. Concluding BORG score was 1 after 2 minutes of recovery. No evidence of resting or exercise induced hypoxemia. No supplemental oxygen is required at this time.    Cultures:  ?? Source Bacterial Culture AFB Smear AFB Culture   04/02/15 Sputum 2+ OPF; 1+ Probably mPsA Negative Negative   07/02/15 Sputum 4+ OPF; 3+ mPsA; 3+ sPsA; 1+ Steno Negative Negative   01/23/16 Sputum 2+ OPF; 1+ mPsA; 1+ sPsA - -   03/24/16 Sputum 3+ OPF; 3+ mPsA; 3+ sPsA Negative Overgrown   07/21/16 Sputum 4+ OPF; 4+ mPsA; 2+ sPsA Negative Negative   12/29/16 Sputum 4+ OPF; 3+ mPsA; 3+ sPsA Negative Actinomadura sp   04/06/17 Sputum 3+ OPF; 4+ mPsA Negative Negative   07/06/17 Sputum 4+ OPF;  3+ mPsA Negative Negative   10/12/17 Sputum 4+ OPF; 3+ mPsA Negative Negative   01/18/18 Sputum OPF Negative Negative   05/17/18 Sputum 4+ OPF; 3+ mPsA - -   09/26/18 Sputum 4+ OPF; 3+ mPsA Negative Negative   01/26/19 Sputum 4+ OPF; 2+ mPsA Negative Negative   02/15/19 Sputum 3+ OPF; 3+ mPsA Negative Negative   06/06/19 Sputum 3+ OPF; 3+ sPsA; 3+ mPsA Negative Negative   11/10/19 Sputum 2+ OPF; 3+ mPsA Negative Negative   02/13/20 Sputum OPF Negative Negative   07/02/20 Sputum 3+ OPF; 1+sPsA; 3+ mPsA negative negative   ??  Bronchiectasis Evaluation   IgG with subclasses:   Lab Results   Component Value Date/Time    IGGT 996 04/28/2012 06:21 AM    IGG1 402 04/28/2012 06:21 AM    IGG2 399 04/28/2012 06:21 AM    IGG3 148.0 (H) 04/28/2012 06:21 AM    IGG4 47.7 04/28/2012 06:21 AM     IgA (09/21/11): 283   IgM (09/21/11): 52   IgE:   Lab Results   Component Value Date/Time    IGE 16.6 02/13/2020 10:24 AM     Specific Titers: No results found for: TETANUSAB, TETGV, DIPHTERIAAB, DIPGV, S. pneumonia titers  HIV (05/02/20): Non-reactive  Autoimmune (05/02/20): ANA negative   ANCA: No results found for: ANCA, IFA, PR3ELISA, PR3QT, MPO, MPOQT  CBC-D (05/02/20): 6.1>14.8/44.3<156; ANC 4.1; ALC 1.2; AEC 0.17  Lab Results   Component Value Date/Time    WBC 6.0 02/13/2020 10:24 AM    WBC 5.4 04/19/2017 05:45 AM    WBC 5.2 04/18/2017 05:23 AM    WBC 7.9 08/13/2013 03:57 AM    WBC 9.4 08/12/2013 04:21 AM    WBC 11.7 (H) 08/11/2013 04:04 AM    HGB 14.4 02/13/2020 10:24 AM    HGB 14.8 03/24/2015 05:31 PM    HCT 43.0 02/13/2020 10:24 AM    HCT 30.8 (L) 08/13/2013 03:57 AM    PLT 167 02/13/2020 10:24 AM    PLT 292 08/13/2013 03:57 AM    NEUTROABS 3.7 02/13/2020 10:24 AM    NEUTROABS 3.2 04/19/2017 05:45 AM    NEUTROABS 3.1 04/18/2017 05:23 AM    NEUTROABS 4.9 06/21/2013 09:31 PM    NEUTROABS 4.6 06/21/2013 08:13 PM    NEUTROABS 4.1 04/26/2012 11:54 PM LYMPHSABS 1.4 02/13/2020 10:24 AM    LYMPHSABS 1.4 (L) 04/19/2017 05:45 AM    LYMPHSABS 1.4 (L) 04/18/2017 05:23 AM    LYMPHSABS 1.9 06/21/2013 09:31 PM    LYMPHSABS 1.8 06/21/2013 08:13 PM    LYMPHSABS 2.8 04/26/2012 11:54 PM    EOSABS 0.2 02/13/2020 10:24 AM    EOSABS 0.2 04/19/2017 05:45 AM    EOSABS 0.2 04/18/2017 05:23 AM    EOSABS 0.2 06/21/2013 09:31 PM    EOSABS 0.2 06/21/2013 08:13 PM    EOSABS 0.3 04/26/2012 11:54 PM    BASOSABS 0.0 02/13/2020 10:24 AM    BASOSABS 0.0 06/21/2013 09:31 PM     Alpha-1 level: No results found for: A1TRYP  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      IgE (05/23/18):  Imaging  CXR (05/02/20): Images not available for review. Per report, Visualized cardiac and mediastinal contours within normal limits allowing for mediastinal shift to the right. Circumferential right pleural fluid/thickening. Volume loss in the right lung consistent with known right lung resection. Irregular opacities throughout both lungs, right greater than left, consistent with known bronchiectasis.     Chest CT (11/10/19): Images personally reviewed. Stable sequelae of right middle lobectomy and right lower lobe wedge resection. Stable consolidation in the medial posterior right upper lobe and right lung apex, bilateral diffuse extensive bronchiectasis and bronchial wall thickening and unchanged sequelae of chronic endobronchial infection.    Chest CT (02/15/19): Images personally reviewed. Stable sequelae of right middle lobectomy and right lower lobe wedge resection.  Unchanged diffuse extensive bronchiectasis and bronchial wall thickening bilaterally.  Diffusely scattered tree-in-bud nodules in bilateral lung parenchyma with interval increase in tree-in-bud nodules in left lung; the constellation of these findings are consistent with sequelae of worsening chronic endobronchial infection.    Chest CT (03/31/16): Images personally reviewed. Status post right middle and right lower lobectomies. Tiny loculated basilar right hydropneumothorax, probably chronic. Moderate cylindrical and varicoid bronchiectasis throughout both lungs, with associated scattered mucoid impaction, tree-in-bud opacities and scattered peribronchovascular and subpleural consolidation in the right upper lobe. 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 Two-vessel coronary atherosclerosis. Nonobstructing right nephrolithiasis.    Modified Barium Swallow (01/17/19): Shallow laryngeal penetration observed with thin liquids and puree.  No aspiration identified with any of the tested consistencies.    Immunization History   Administered Date(s) Administered   ??? COVID-19 VACC,MRNA,(PFIZER)(PF)(IM) 04/20/2019, 05/18/2019, 12/01/2019, 05/03/2020   ??? Hepatitis A 05/13/2020   ??? Hepatitis B, Adult 05/13/2020, 06/11/2020   ??? INFLUENZA TIV (TRI) 49MO+ W/ PRESERV (IM) 11/13/2010, 01/07/2012, 12/09/2017, 11/30/2019   ??? INFLUENZA TIV (TRI) PF (IM) 02/20/2012, 12/09/2012   ??? Influenza Vaccine Quad (IIV4 PF) 47mo+ injectable 01/31/2016, 12/09/2017   ??? Influenza Virus Vaccine, unspecified formulation 12/25/2014, 12/01/2016   ??? Influenza Whole 12/21/2008   ??? PNEUMOCOCCAL POLYSACCHARIDE 23 10/22/2010, 02/07/2016, 10/07/2019   ??? Pneumococcal Conjugate 13-Valent 04/28/2013   ??? TdaP 03/22/2012   ??? ZOSTAVAX - ZOSTER VACCINE, LIVE, SQ 07/03/2014, 06/11/2017, 01/19/2018     Portions of this record have been created using [employer]. Dictation errors have been sought, but may not have been identified and corrected.    Viona Gilmore, MD  10/31/20  9:27 AM

## 2020-10-30 NOTE — Unmapped (Incomplete)
When exercising, increase speed or incline.  Keep oxygen the same flow.    Let me know what your cardiologist finds.    Symptoms of a bronchiectasis exacerbation: 48 hours of at least two of the following:  Increased cough  Change in volume or appearance of sputum  Increased sputum purulence  Worsening shortness of breath and/or exercise tolerance  Fatigue and/or malaise  Coughing up blood (hemoptysis)    If you are experiencing some of these symptoms:  Increase the frequency and/or intensity of your airway clearance  Try to submit a sputum sample for bacterial and AFB cultures (at [facility] or locally)  Reach out to me or your local physician.  If you need antibiotics, recommend treating for 14 days.      Please bring any new airway clearance equipment to your next visit to ensure that you are using and caring for it properly.    Information about getting your COVID-19 vaccine or COVID-19 treatment: yourshot.org    Interested in participating in trials for a COVID-19 vaccine or treatments for COVID-19? https://www.coronaviruspreventionnetwork.org    Thank you for allowing me to be a part of your care. Please call the clinic with any questions.    Viona Gilmore, MD, MPH  Pulmonary and Critical Care Medicine  [address]  [id number]  [place], [place] [address]    Thank you for your visit to the [facility] Pulmonary Clinics. You may receive a survey from [facility] regarding your visit today, and we are eager to use this feedback to improve your experience. Thank you for taking the time to fill it out.    Between appointments, you can reach [place] at these numbers:  For appointments: [phone]  For my nurse: [phone]  Fax: [phone]  For urgent issues after hours: Hospital Operator @ [phone] & ask for Pulmonary Fellow on call    For further information, check out the websites below:    [facility] for Bronchiectasis: ScienceMakers.nl    Impact Airway Clearance Education: http://www.impact-be.com    Videos from the patient session of the World Bronchiectasis Conference in [place] on October 03, 2016 can be watched here (TrustyNews.es).     Bronchiectasis Toolbox (information about airway clearance): DiningCalendar.de    Copy for Individuals with Bronchiectasis and/or NTM through the Bronchiectasis Registry and the COPD Foundation: https://www.mckee-young.org/    Information about Non-tuberculous Mycobacterial Infections:  https://www.ntminfo.org     Interested in clinical trials and other research opportunities?  www.clinicaltrials.gov    The Rare Diseases Clinical Research Network [facility]) Genetic Disorders of  Mucociliary Clearance Consortium [facility]) Contact Registry is a way for patients with disorders of mucociliary clearance (such as bronchiectasis) and their family members to learn about research studies they may be able to join. Participation is completely voluntary and you may choose to withdraw at any time. There is no cost to join the [employer]. For more information or to join the registry please go to the following website:  [email or web address]

## 2020-10-31 ENCOUNTER — Encounter: Admit: 2020-10-31 | Discharge: 2020-11-01 | Payer: MEDICARE

## 2020-10-31 ENCOUNTER — Encounter: Admit: 2020-10-31 | Discharge: 2020-11-01 | Payer: MEDICARE | Attending: Internal Medicine | Primary: Internal Medicine

## 2020-10-31 DIAGNOSIS — G4733 Obstructive sleep apnea (adult) (pediatric): Secondary | ICD-10-CM | POA: Diagnosis not present

## 2020-10-31 DIAGNOSIS — J479 Bronchiectasis, uncomplicated: Secondary | ICD-10-CM | POA: Diagnosis not present

## 2020-10-31 DIAGNOSIS — M810 Age-related osteoporosis without current pathological fracture: Secondary | ICD-10-CM | POA: Diagnosis not present

## 2020-10-31 DIAGNOSIS — J9611 Chronic respiratory failure with hypoxia: Secondary | ICD-10-CM | POA: Diagnosis not present

## 2020-10-31 DIAGNOSIS — J471 Bronchiectasis with (acute) exacerbation: Secondary | ICD-10-CM | POA: Diagnosis not present

## 2020-10-31 DIAGNOSIS — I10 Essential (primary) hypertension: Secondary | ICD-10-CM | POA: Diagnosis not present

## 2020-10-31 DIAGNOSIS — B965 Pseudomonas (aeruginosa) (mallei) (pseudomallei) as the cause of diseases classified elsewhere: Secondary | ICD-10-CM | POA: Diagnosis not present

## 2020-10-31 DIAGNOSIS — A498 Other bacterial infections of unspecified site: Secondary | ICD-10-CM | POA: Diagnosis not present

## 2020-11-04 ENCOUNTER — Other Ambulatory Visit: Payer: Medicare Other

## 2020-11-04 DIAGNOSIS — R0602 Shortness of breath: Secondary | ICD-10-CM | POA: Diagnosis not present

## 2020-11-04 DIAGNOSIS — J9610 Chronic respiratory failure, unspecified whether with hypoxia or hypercapnia: Secondary | ICD-10-CM | POA: Diagnosis not present

## 2020-11-04 DIAGNOSIS — R0600 Dyspnea, unspecified: Secondary | ICD-10-CM | POA: Diagnosis not present

## 2020-11-04 DIAGNOSIS — J479 Bronchiectasis, uncomplicated: Secondary | ICD-10-CM | POA: Diagnosis not present

## 2020-11-04 NOTE — Unmapped (Signed)
Called patient and left a message regarding oxygen for travel.

## 2020-11-06 ENCOUNTER — Other Ambulatory Visit: Payer: Self-pay

## 2020-11-06 ENCOUNTER — Ambulatory Visit: Payer: Medicare Other

## 2020-11-06 DIAGNOSIS — R0602 Shortness of breath: Secondary | ICD-10-CM | POA: Diagnosis not present

## 2020-11-06 DIAGNOSIS — J9610 Chronic respiratory failure, unspecified whether with hypoxia or hypercapnia: Secondary | ICD-10-CM | POA: Diagnosis not present

## 2020-11-06 DIAGNOSIS — R0600 Dyspnea, unspecified: Secondary | ICD-10-CM | POA: Diagnosis not present

## 2020-11-06 DIAGNOSIS — I499 Cardiac arrhythmia, unspecified: Secondary | ICD-10-CM

## 2020-11-06 DIAGNOSIS — I10 Essential (primary) hypertension: Secondary | ICD-10-CM | POA: Diagnosis not present

## 2020-11-06 DIAGNOSIS — J479 Bronchiectasis, uncomplicated: Secondary | ICD-10-CM | POA: Diagnosis not present

## 2020-11-06 DIAGNOSIS — R000 Tachycardia, unspecified: Secondary | ICD-10-CM

## 2020-11-07 DIAGNOSIS — I10 Essential (primary) hypertension: Secondary | ICD-10-CM | POA: Diagnosis not present

## 2020-11-07 DIAGNOSIS — E785 Hyperlipidemia, unspecified: Secondary | ICD-10-CM | POA: Diagnosis not present

## 2020-11-07 DIAGNOSIS — J479 Bronchiectasis, uncomplicated: Secondary | ICD-10-CM | POA: Diagnosis not present

## 2020-11-07 DIAGNOSIS — D509 Iron deficiency anemia, unspecified: Secondary | ICD-10-CM | POA: Diagnosis not present

## 2020-11-07 DIAGNOSIS — D649 Anemia, unspecified: Secondary | ICD-10-CM | POA: Diagnosis not present

## 2020-11-07 DIAGNOSIS — J471 Bronchiectasis with (acute) exacerbation: Secondary | ICD-10-CM | POA: Diagnosis not present

## 2020-11-07 DIAGNOSIS — G8929 Other chronic pain: Secondary | ICD-10-CM | POA: Diagnosis not present

## 2020-11-07 DIAGNOSIS — M159 Polyosteoarthritis, unspecified: Secondary | ICD-10-CM | POA: Diagnosis not present

## 2020-11-07 DIAGNOSIS — M810 Age-related osteoporosis without current pathological fracture: Secondary | ICD-10-CM | POA: Diagnosis not present

## 2020-11-07 DIAGNOSIS — J449 Chronic obstructive pulmonary disease, unspecified: Secondary | ICD-10-CM | POA: Diagnosis not present

## 2020-11-08 NOTE — Unmapped (Signed)
-----   Message from Elson Clan, MD sent at 11/08/2020  9:04 AM EDT -----  Regarding: Schedule patient visit  Hello,    Can someone reach out to Mr. Ellner to see if he would like to schedule a follow up visit with Palliative Care on 12/27. This is the same day that he sees his pulmonologist and the plan was to try and coordinate our follow up visits for the same day so he should be aware.    Thank you,  Darrin Nipper

## 2020-11-08 NOTE — Unmapped (Signed)
Talked to patient regarding his oxygen for a trip.  He says he is unsure what he wants to do and will call me back to let me know.  His trip is September 3rd.

## 2020-11-08 NOTE — Unmapped (Signed)
Can someone reach out to Mr. Markarian to see if he would like to schedule a follow up visit with Palliative Care on 12/27. This is the same day that he sees his pulmonologist and the plan was to try and coordinate our follow up visits for the same day so he should be aware.     Thank you,   Darrin Nipper      Called to schedule

## 2020-11-11 DIAGNOSIS — R0602 Shortness of breath: Secondary | ICD-10-CM | POA: Diagnosis not present

## 2020-11-11 DIAGNOSIS — J9610 Chronic respiratory failure, unspecified whether with hypoxia or hypercapnia: Secondary | ICD-10-CM | POA: Diagnosis not present

## 2020-11-11 DIAGNOSIS — R0600 Dyspnea, unspecified: Secondary | ICD-10-CM | POA: Diagnosis not present

## 2020-11-11 DIAGNOSIS — J479 Bronchiectasis, uncomplicated: Secondary | ICD-10-CM | POA: Diagnosis not present

## 2020-11-11 NOTE — Unmapped (Addendum)
Mychart message sent .  Needs appointment  ----- Message from Elson Clan, MD sent at 11/08/2020  9:04 AM EDT -----  Regarding: Schedule patient visit  Hello,    Can someone reach out to Mr. Buchanon to see if he would like to schedule a follow up visit with Palliative Care on 12/27. This is the same day that he sees his pulmonologist and the plan was to try and coordinate our follow up visits for the same day so he should be aware.    Thank you,  Darrin Nipper

## 2020-11-11 NOTE — Unmapped (Signed)
[facility] Clinical Assessment & Refill Coordination Note    Jordan [name], DOB: 12-30-1955  Phone: [phone] (home)     All above HIPAA information was verified with patient.     Was a Nurse, learning disability used for this call? No    Specialty Medication(s):   CF/Pulmonary: -[name] Brand (tobramycin 300mg /51mL) inhalation solution  -Nebulized colistin (150mg  vials) and supply kit     Current Outpatient Medications   Medication Sig Dispense Refill   ??? albuterol 2.5 mg /3 mL (0.083 %) nebulizer solution Inhale 3 mL (2.5 mg total) by nebulization Two (2) times a day. 540 mL 3   ??? albuterol HFA 90 mcg/actuation inhaler Inhale 2 puffs Four (4) times a day. 25.5 g 3   ??? alcohol swabs (ALCOHOL PREP PADS) PadM Use as directed with inhaled antibiotics 100 each 5   ??? ascorbic acid, vitamin C, (VITAMIN C) 1000 MG tablet Take 1,000 mg by mouth daily.     ??? azithromycin (ZITHROMAX) 250 MG tablet Take 1 tablet (250 mg total) by mouth daily. 90 tablet 3   ??? cholecalciferol, vitamin D3-50 mcg, 2,000 unit,, 50 mcg (2,000 unit) cap Take 2,000 Units by mouth.     ??? coenzyme Q10 300 mg cap Take 10-300 mg by mouth.     ??? colistimethate (COLYMYCIN) 150 mg injection Inject 2 mL sterile water for injection to mix colistin vial, then draw up 2 mL (150mg ) and inhale 2 times a day, 28 days on and 28 days off. 60 each 5   ??? dornase alfa (PULMOZYME) 1 mg/mL nebulizer solution Inhale 1 ampule (2.5 mg) daily. Use at least 30-60 minutes before airway clearance, or after airway clearance 225 mL 3   ??? empty container Misc USE AS DIRECTED 1 each 2   ??? ferrous sulfate 325 (65 FE) MG tablet Take 325 mg by mouth Two (2) times a day. BID     ??? HYDROcodone-acetaminophen (NORCO) 7.5-325 mg per tablet Take 1 tablet by mouth three (3) times a day (at 6am, noon and 6pm).     ??? HYDROcodone-homatropine (HYCODAN) 5-1.5 mg/5 mL syrup Take 5 mL by mouth every four (4) hours as needed for cough. 120 mL 0   ??? montelukast (SINGULAIR) 10 mg tablet Take 1 tablet (10 mg total) by mouth nightly. 90 tablet 3   ??? multivitamin-minerals-lutein Tab Take 1 tablet by mouth daily.      ??? nebulizers (LC PLUS) Misc use with Colistin 1 each 5   ??? nebulizers (LC PLUS) Misc Use as directed with hypertonic saline 10% 3 each 3   ??? nebulizers Misc Use as directed with tobramycin 1 each 5   ??? needle, disp, 19 G (BD REGULAR BEVEL NEEDLES) 19 gauge x 1 1/2 Ndle Use as directed with inhaled Colistin 220 each 0   ??? omeprazole (PRILOSEC) 20 MG capsule Take 20 mg by mouth Two (2) times a day.      ??? sodium chloride (BD POSIFLUSH NORMAL SALINE 0.9) 0.9 % injection Inject 2mL of 0.9%NaCl into colistin vial & gently mix. After withdrawing colistin dose, add an additional 1mL of 0.9%NaCl to neb cup with the colistin dose. 180 mL 11   ??? sodium chloride 10 % Nebu Inhale 5 mL by nebulization Two (2) times a day. 900 mL 3   ??? sterile water Soln Use 2mL to mix Colistin,then add additional 2mL to neb cup with 2mL of mixed colistin.Inhale twice daily 28 days on and 28 days  off. 600 mL 5   ??? SYMBICORT 160-4.5 mcg/actuation inhaler INHALE 2 PUFFS INTO THE LUNGS 2 TIMES A DAY. 30.6 g 1   ??? syringe with needle (BD LUER-LOK SYRINGE) 3 mL 20 gauge x 1 1/2 Syrg For use with inhaled antibiotic (Colistin) 60 each 30   ??? syringe with needle (BD LUER-LOK SYRINGE) 3 mL 20 gauge x 1 Syrg For use with inhaled antibiotic (Colistin) 60 each 5   ??? tiotropium bromide (SPIRIVA RESPIMAT) 2.5 mcg/actuation inhalation mist Inhale 2 puffs daily. 12 g 3   ??? tobramycin, PF, ([name]) 300 mg/5 mL nebulizer solution Inhale 5 mL (300 mg total) by nebulization every twelve (12) hours. 28 days on and 28 days off. 280 mL 5   ??? zinc gluconate 50 mg (7 mg elemental zinc) tablet Take 50 mg by mouth.       No current facility-administered medications for this visit.        Changes to medications: [name] reports no changes at this time.    Allergies   Allergen Reactions   ??? Gentamicin Other (See Comments)     BALANCE ISSUES ??? Tobramycin Other (See Comments)     ototoxicity   ??? Morphine Nausea And Vomiting       Changes to allergies: No    SPECIALTY MEDICATION ADHERENCE     Colistin 150 mg: 0 days of medicine on hand   Tobramycin 300/62mL: 7 days of medicine on hand     Medication Adherence    Patient reported X missed doses in the last month: 0  Specialty Medication: tobramycin 300mg /57mL  Patient is on additional specialty medications: Yes  Additional Specialty Medications: Colistin 150mg   Patient Reported Additional Medication X Missed Doses in the Last Month: 0  Any gaps in refill history greater than 2 weeks in the last 3 months: no  Informant: patient  Support network for adherence: family member          Specialty medication(s) dose(s) confirmed: Regimen is correct and unchanged.     Are there any concerns with adherence? No    Adherence counseling provided? Not needed    CLINICAL MANAGEMENT AND INTERVENTION      Clinical Benefit Assessment:    Do you feel the medicine is effective or helping your condition? Yes    Clinical Benefit counseling provided? Progress note from 8/11 shows evidence of clinical benefit    Adverse Effects Assessment:    Are you experiencing any side effects? No    Are you experiencing difficulty administering your medicine? No    Quality of Life Assessment:    Quality of Life    Rheumatology  Oncology  Dermatology  Cystic Fibrosis          How many days over the past month did your bronchiectasis  keep you from your normal activities? For example, brushing your teeth or getting up in the morning. 2-3 days per month    Have you discussed this with your provider? Not needed    Acute Infection Status:    Acute infections noted within Epic:  MDR Pseudomonas  Patient reported infection: None    Therapy Appropriateness:    Is therapy appropriate? Yes, therapy is appropriate and should be continued    DISEASE/MEDICATION-SPECIFIC INFORMATION      N/A    PATIENT SPECIFIC NEEDS     - Does the patient have any physical, cognitive, or cultural barriers? No    - Is the patient high risk? No    -  Does the patient require a Care Management Plan? No     - Does the patient require physician intervention or other additional services (i.e. nutrition, smoking cessation, social work)? No      SHIPPING     Specialty Medication(s) to be Shipped:   CF/Pulmonary: -Nebulized colistin (150mg  vials) and supply kit    Other medication(s) to be shipped: No additional medications requested for fill at this time     Denied refills for Spiriva and HTS 10%     Changes to insurance: No    Delivery Scheduled: Yes, Expected medication delivery date: 11/15/20.     Medication will be delivered via UPS to the confirmed prescription address in [facility].    The patient will receive a drug information handout for each medication shipped and additional FDA Medication Guides as required.  Verified that patient has previously received a Conservation officer, historic buildings and a Surveyor, mining.    The patient or caregiver noted above participated in the development of this care plan and knows that they can request review of or adjustments to the care plan at any time.      All of the patient's questions and concerns have been addressed.    Oliva Bustard   [facility] Specialty Pharmacist

## 2020-11-13 ENCOUNTER — Other Ambulatory Visit: Payer: Self-pay | Admitting: Cardiology

## 2020-11-13 ENCOUNTER — Other Ambulatory Visit: Payer: Self-pay

## 2020-11-13 DIAGNOSIS — I499 Cardiac arrhythmia, unspecified: Secondary | ICD-10-CM | POA: Diagnosis not present

## 2020-11-13 DIAGNOSIS — Z79899 Other long term (current) drug therapy: Secondary | ICD-10-CM | POA: Diagnosis not present

## 2020-11-13 DIAGNOSIS — R000 Tachycardia, unspecified: Secondary | ICD-10-CM | POA: Diagnosis not present

## 2020-11-13 DIAGNOSIS — E785 Hyperlipidemia, unspecified: Secondary | ICD-10-CM | POA: Diagnosis not present

## 2020-11-13 DIAGNOSIS — Z23 Encounter for immunization: Secondary | ICD-10-CM | POA: Diagnosis not present

## 2020-11-13 DIAGNOSIS — I10 Essential (primary) hypertension: Secondary | ICD-10-CM

## 2020-11-13 NOTE — Progress Notes (Signed)
Error

## 2020-11-14 ENCOUNTER — Ambulatory Visit: Payer: Medicare Other | Admitting: Cardiology

## 2020-11-14 MED FILL — COLISTIN (COLISTIMETHATE SODIUM) 150 MG SOLUTION FOR INJECTION: 30 days supply | Qty: 60 | Fill #2

## 2020-11-14 MED FILL — LC PLUS MISC: 30 days supply | Qty: 1 | Fill #4

## 2020-11-14 MED FILL — BD REGULAR BEVEL NEEDLES 21 GAUGE X 1 1/2": 30 days supply | Qty: 60 | Fill #3

## 2020-11-14 MED FILL — BD POSIFLUSH NORMAL SALINE 0.9 % INJECTION SYRINGE: 30 days supply | Qty: 180 | Fill #4

## 2020-11-14 MED FILL — BD LUER-LOK SYRINGE 3 ML 20 GAUGE X 1 1/2": 30 days supply | Qty: 60 | Fill #4

## 2020-11-15 DIAGNOSIS — R0600 Dyspnea, unspecified: Secondary | ICD-10-CM | POA: Diagnosis not present

## 2020-11-15 DIAGNOSIS — J479 Bronchiectasis, uncomplicated: Secondary | ICD-10-CM | POA: Diagnosis not present

## 2020-11-15 DIAGNOSIS — R0602 Shortness of breath: Secondary | ICD-10-CM | POA: Diagnosis not present

## 2020-11-15 DIAGNOSIS — J9610 Chronic respiratory failure, unspecified whether with hypoxia or hypercapnia: Secondary | ICD-10-CM | POA: Diagnosis not present

## 2020-11-17 ENCOUNTER — Ambulatory Visit: Admit: 2020-11-17 | Discharge: 2020-11-20 | Disposition: A | Discharge status: Home / Self Care | Payer: MEDICARE

## 2020-11-17 DIAGNOSIS — I455 Other specified heart block: Secondary | ICD-10-CM | POA: Diagnosis not present

## 2020-11-17 DIAGNOSIS — J984 Other disorders of lung: Secondary | ICD-10-CM | POA: Diagnosis not present

## 2020-11-17 DIAGNOSIS — R918 Other nonspecific abnormal finding of lung field: Secondary | ICD-10-CM | POA: Diagnosis not present

## 2020-11-17 DIAGNOSIS — R0602 Shortness of breath: Secondary | ICD-10-CM | POA: Diagnosis not present

## 2020-11-17 DIAGNOSIS — R059 Cough, unspecified: Secondary | ICD-10-CM | POA: Diagnosis not present

## 2020-11-17 DIAGNOSIS — D509 Iron deficiency anemia, unspecified: Secondary | ICD-10-CM | POA: Diagnosis not present

## 2020-11-17 DIAGNOSIS — B348 Other viral infections of unspecified site: Secondary | ICD-10-CM | POA: Diagnosis not present

## 2020-11-17 DIAGNOSIS — R112 Nausea with vomiting, unspecified: Secondary | ICD-10-CM | POA: Diagnosis not present

## 2020-11-17 DIAGNOSIS — Z1623 Resistance to quinolones and fluoroquinolones: Secondary | ICD-10-CM | POA: Diagnosis not present

## 2020-11-17 DIAGNOSIS — R7989 Other specified abnormal findings of blood chemistry: Secondary | ICD-10-CM | POA: Diagnosis not present

## 2020-11-17 DIAGNOSIS — B9789 Other viral agents as the cause of diseases classified elsewhere: Secondary | ICD-10-CM | POA: Diagnosis not present

## 2020-11-17 DIAGNOSIS — I444 Left anterior fascicular block: Secondary | ICD-10-CM | POA: Diagnosis not present

## 2020-11-17 DIAGNOSIS — K219 Gastro-esophageal reflux disease without esophagitis: Secondary | ICD-10-CM | POA: Diagnosis not present

## 2020-11-17 DIAGNOSIS — I10 Essential (primary) hypertension: Secondary | ICD-10-CM | POA: Diagnosis not present

## 2020-11-17 DIAGNOSIS — J470 Bronchiectasis with acute lower respiratory infection: Secondary | ICD-10-CM | POA: Diagnosis not present

## 2020-11-17 DIAGNOSIS — B965 Pseudomonas (aeruginosa) (mallei) (pseudomallei) as the cause of diseases classified elsewhere: Secondary | ICD-10-CM | POA: Diagnosis not present

## 2020-11-17 DIAGNOSIS — I4949 Other premature depolarization: Secondary | ICD-10-CM | POA: Diagnosis not present

## 2020-11-17 DIAGNOSIS — Z9981 Dependence on supplemental oxygen: Secondary | ICD-10-CM | POA: Diagnosis not present

## 2020-11-17 DIAGNOSIS — J9611 Chronic respiratory failure with hypoxia: Secondary | ICD-10-CM | POA: Diagnosis not present

## 2020-11-17 DIAGNOSIS — J471 Bronchiectasis with (acute) exacerbation: Secondary | ICD-10-CM | POA: Diagnosis not present

## 2020-11-17 DIAGNOSIS — Z20822 Contact with and (suspected) exposure to covid-19: Secondary | ICD-10-CM | POA: Diagnosis not present

## 2020-11-17 DIAGNOSIS — G4733 Obstructive sleep apnea (adult) (pediatric): Secondary | ICD-10-CM | POA: Diagnosis not present

## 2020-11-17 LAB — CBC W/ AUTO DIFF
BASOPHILS ABSOLUTE COUNT: 0 10*9/L (ref 0.0–0.1)
BASOPHILS RELATIVE PERCENT: 0.3 %
EOSINOPHILS ABSOLUTE COUNT: 0.2 10*9/L (ref 0.0–0.5)
EOSINOPHILS RELATIVE PERCENT: 1.7 %
HEMATOCRIT: 45.4 % (ref 39.0–48.0)
HEMOGLOBIN: 15.6 g/dL (ref 12.9–16.5)
LYMPHOCYTES ABSOLUTE COUNT: 1.4 10*9/L (ref 1.1–3.6)
LYMPHOCYTES RELATIVE PERCENT: 12.9 %
MEAN CORPUSCULAR HEMOGLOBIN CONC: 34.4 g/dL (ref 32.0–36.0)
MEAN CORPUSCULAR HEMOGLOBIN: 31.1 pg (ref 25.9–32.4)
MEAN CORPUSCULAR VOLUME: 90.6 fL (ref 77.6–95.7)
MEAN PLATELET VOLUME: 8.1 fL (ref 6.8–10.7)
MONOCYTES ABSOLUTE COUNT: 1.2 10*9/L — ABNORMAL HIGH (ref 0.3–0.8)
MONOCYTES RELATIVE PERCENT: 11.5 %
NEUTROPHILS ABSOLUTE COUNT: 7.8 10*9/L (ref 1.8–7.8)
NEUTROPHILS RELATIVE PERCENT: 73.6 %
PLATELET COUNT: 173 10*9/L (ref 150–450)
RED BLOOD CELL COUNT: 5.01 10*12/L (ref 4.26–5.60)
RED CELL DISTRIBUTION WIDTH: 12.3 % (ref 12.2–15.2)
WBC ADJUSTED: 10.6 10*9/L (ref 3.6–11.2)

## 2020-11-17 LAB — COMPREHENSIVE METABOLIC PANEL
ALBUMIN: 4.2 g/dL (ref 3.4–5.0)
ALKALINE PHOSPHATASE: 105 U/L (ref 46–116)
ALT (SGPT): 19 U/L (ref 10–49)
ANION GAP: 4 mmol/L — ABNORMAL LOW (ref 5–14)
AST (SGOT): 19 U/L (ref <=34)
BILIRUBIN TOTAL: 0.5 mg/dL (ref 0.3–1.2)
BLOOD UREA NITROGEN: 21 mg/dL (ref 9–23)
BUN / CREAT RATIO: 18
CALCIUM: 9.3 mg/dL (ref 8.7–10.4)
CHLORIDE: 109 mmol/L — ABNORMAL HIGH (ref 98–107)
CO2: 24 mmol/L (ref 20.0–31.0)
CREATININE: 1.19 mg/dL — ABNORMAL HIGH
EGFR CKD-EPI (2021) MALE: 67 mL/min/{1.73_m2} (ref >=60)
GLUCOSE RANDOM: 98 mg/dL (ref 70–179)
POTASSIUM: 4.7 mmol/L (ref 3.4–4.8)
PROTEIN TOTAL: 7.8 g/dL (ref 5.7–8.2)
SODIUM: 137 mmol/L (ref 135–145)

## 2020-11-17 MED ADMIN — albuterol 2.5 mg /3 mL (0.083 %) nebulizer solution 5 mg: 5 mg | RESPIRATORY_TRACT | Stop: 2020-11-17

## 2020-11-17 MED ADMIN — ipratropium (ATROVENT) 0.02 % nebulizer solution 500 mcg: 500 ug | RESPIRATORY_TRACT | Stop: 2020-11-17

## 2020-11-17 MED ADMIN — sodium chloride 0.9 % nebulizer solution 3 mL: 3 mL | RESPIRATORY_TRACT | Stop: 2020-11-17

## 2020-11-17 NOTE — Unmapped (Signed)
Pt has bronchiectasis and started having a really bad cough last night. Pt threw up a couple of times from coughing and then is also feeling weak

## 2020-11-17 NOTE — Unmapped (Signed)
[facility]  Emergency Department Medical Screening Examination     Subjective     Jordan [name] is a 65 y.o. male presenting for evaluation of Cough. Patient presents with 1 day of cough, multiple episodes of emesis, and weakness. He reports a history of bronchiectasis.     Abbreviated Review of Systems/Covid Screen  Constitutional: Negative for fever  Respiratory: Negative for cough. Negative for difficulty breathing.    Objective     ED Triage Vitals [11/17/20 1322]   Enc Vitals Group      BP 115/83      Heart Rate 108      SpO2 Pulse       Resp 22      Temp 36.7 ??C (98.1 ??F)      Temp src       SpO2 95 %        Focused Physical Exam  Constitutional: No acute distress.  Respiratory: Non-labored respirations.  Neurological: Clear speech. No gross focal neurologic deficits are appreciated.  ?  Assessment & Plan     Plan for rapid influenza/RSV/COVID PCR, lower respiratory culture, UA, and basic labs.     A medical screening exam has been performed. At the time of this evaluation, no emergency medical condition requiring immediate stabilization has been identified nor is there suspicion for imminent decompensation. Appropriate triage protocols will be implemented and a comprehensive ED evaluation with disposition will be completed by a healthcare provider when an appropriate ED location becomes available. The patient is aware that this is an initial encounter only and verbalizes understanding and agreement with the plan.     Emergency Department operations continue to be impacted by the COVID-19 pandemic.    Documentation assistance was provided by Marlyn Corporal, Scribe on November 17, 2020 at 1:29 PM for Dewitt Hoes, MD.      {*** NOTE TO PROVIDER: PLEASE ADD .EDPROVSCRIBEATTEST NOTING YOU AGREE WITH SCRIBE DOCUMENTATION}       Laurie-anne  Isabel Freese  November 17, 2020 1:22 PM

## 2020-11-18 LAB — URINALYSIS WITH CULTURE REFLEX
BACTERIA: NONE SEEN /HPF
BILIRUBIN UA: NEGATIVE
BLOOD UA: NEGATIVE
GLUCOSE UA: NEGATIVE
LEUKOCYTE ESTERASE UA: NEGATIVE
NITRITE UA: NEGATIVE
PH UA: 5.5 (ref 5.0–9.0)
RBC UA: 1 /HPF (ref <=3)
SPECIFIC GRAVITY UA: 1.032 — ABNORMAL HIGH (ref 1.003–1.030)
SQUAMOUS EPITHELIAL: <1 /HPF (ref 0–5)
UROBILINOGEN UA: <2
WBC UA: <1 /HPF (ref <=2)

## 2020-11-18 LAB — BASIC METABOLIC PANEL
ANION GAP: 9 mmol/L (ref 5–14)
BLOOD UREA NITROGEN: 29 mg/dL — ABNORMAL HIGH (ref 9–23)
BUN / CREAT RATIO: 28
CALCIUM: 8.6 mg/dL — ABNORMAL LOW (ref 8.7–10.4)
CHLORIDE: 104 mmol/L (ref 98–107)
CO2: 23 mmol/L (ref 20.0–31.0)
CREATININE: 1.05 mg/dL
EGFR CKD-EPI (2021) MALE: 78 mL/min/{1.73_m2} (ref >=60)
GLUCOSE RANDOM: 97 mg/dL (ref 70–179)
POTASSIUM: 3.4 mmol/L (ref 3.4–4.8)
SODIUM: 136 mmol/L (ref 135–145)

## 2020-11-18 LAB — MAGNESIUM: MAGNESIUM: 1.6 mg/dL (ref 1.6–2.6)

## 2020-11-18 LAB — CBC
HEMATOCRIT: 39.7 % (ref 39.0–48.0)
HEMOGLOBIN: 13.5 g/dL (ref 12.9–16.5)
MEAN CORPUSCULAR HEMOGLOBIN CONC: 34 g/dL (ref 32.0–36.0)
MEAN CORPUSCULAR HEMOGLOBIN: 31 pg (ref 25.9–32.4)
MEAN CORPUSCULAR VOLUME: 91.1 fL (ref 77.6–95.7)
MEAN PLATELET VOLUME: 8.4 fL (ref 6.8–10.7)
PLATELET COUNT: 143 10*9/L — ABNORMAL LOW (ref 150–450)
RED BLOOD CELL COUNT: 4.36 10*12/L (ref 4.26–5.60)
RED CELL DISTRIBUTION WIDTH: 12.4 % (ref 12.2–15.2)
WBC ADJUSTED: 7.7 10*9/L (ref 3.6–11.2)

## 2020-11-18 MED ADMIN — fluticasone furoate-vilanteroL (BREO ELLIPTA) 200-25 mcg/dose inhaler 1 puff: 1 | RESPIRATORY_TRACT | @ 12:00:00

## 2020-11-18 MED ADMIN — bismuth subsalicylate (PEPTO-BISMOL) oral suspension: 30 mL | ORAL | @ 23:00:00

## 2020-11-18 MED ADMIN — HYDROcodone-homatropine (HYCODAN) 5-1.5 mg/5 mL (5 mL) syrup 5 mL: 5 mL | ORAL | @ 19:00:00 | Stop: 2020-12-02

## 2020-11-18 MED ADMIN — tobramycin (PF) (TOBI) 300 mg/5 mL nebulizer solution 300 mg: 300 mg | RESPIRATORY_TRACT | @ 04:00:00 | Stop: 2020-11-18

## 2020-11-18 MED ADMIN — sodium chloride 7% nebulizer solution 4 mL: 4 mL | RESPIRATORY_TRACT | @ 16:00:00

## 2020-11-18 MED ADMIN — umeclidinium (INCRUSE ELLIPTA) 62.5 mcg/actuation inhaler 1 puff: 1 | RESPIRATORY_TRACT | @ 12:00:00

## 2020-11-18 MED ADMIN — cefepime (MAXIPIME) 2 g in sodium chloride 0.9 % (NS) 100 mL IVPB-connector bag: 2 g | INTRAVENOUS | @ 10:00:00 | Stop: 2020-11-18

## 2020-11-18 MED ADMIN — ciprofloxacin HCl (CIPRO) tablet 750 mg: 750 mg | ORAL | @ 13:00:00 | Stop: 2020-12-01

## 2020-11-18 MED ADMIN — dornase alfa (PULMOZYME) 1 mg/mL solution 2.5 mg: 2.5 mg | RESPIRATORY_TRACT | @ 12:00:00

## 2020-11-18 MED ADMIN — albuterol 2.5 mg /3 mL (0.083 %) nebulizer solution 2.5 mg: 2.5 mg | RESPIRATORY_TRACT | @ 12:00:00 | Stop: 2020-11-18

## 2020-11-18 MED ADMIN — magnesium sulfate 2gm/50mL IVPB: 2 g | INTRAVENOUS | @ 13:00:00 | Stop: 2020-11-18

## 2020-11-18 MED ADMIN — sodium chloride 7% nebulizer solution 4 mL: 4 mL | RESPIRATORY_TRACT | @ 12:00:00

## 2020-11-18 MED ADMIN — pantoprazole (PROTONIX) EC tablet 20 mg: 20 mg | ORAL | @ 13:00:00

## 2020-11-18 MED ADMIN — cefepime (MAXIPIME) 2 g in sodium chloride 0.9 % (NS) 100 mL IVPB-connector bag: 2 g | INTRAVENOUS | @ 02:00:00 | Stop: 2020-12-01

## 2020-11-18 MED ADMIN — HYDROcodone-acetaminophen (NORCO) 5-325 mg per tablet 1.5 tablet: 1.5 | ORAL | @ 07:00:00 | Stop: 2020-12-02

## 2020-11-18 MED ADMIN — sodium chloride 7% nebulizer solution 4 mL: 4 mL | RESPIRATORY_TRACT

## 2020-11-18 MED ADMIN — HYDROcodone-acetaminophen (NORCO) 5-325 mg per tablet 1.5 tablet: 1.5 | ORAL | @ 16:00:00 | Stop: 2020-12-02

## 2020-11-18 MED ADMIN — albuterol (PROVENTIL HFA;VENTOLIN HFA) 90 mcg/actuation inhaler 2 puff: 2 | RESPIRATORY_TRACT | @ 10:00:00 | Stop: 2020-11-18

## 2020-11-18 MED ADMIN — tobramycin (PF) (TOBI) 300 mg/5 mL nebulizer solution 300 mg: 300 mg | RESPIRATORY_TRACT | @ 12:00:00 | Stop: 2020-11-18

## 2020-11-18 MED ADMIN — montelukast (SINGULAIR) tablet 10 mg: 10 mg | ORAL | @ 02:00:00

## 2020-11-18 MED ADMIN — cholecalciferol (vitamin D3 25 mcg (1,000 units)) tablet 50 mcg: 50 ug | ORAL | @ 13:00:00

## 2020-11-18 MED ADMIN — albuterol 2.5 mg /3 mL (0.083 %) nebulizer solution 2.5 mg: 2.5 mg | RESPIRATORY_TRACT

## 2020-11-18 MED ADMIN — ferrous sulfate tablet 325 mg: 325 mg | ORAL | @ 13:00:00

## 2020-11-18 MED ADMIN — albuterol 2.5 mg /3 mL (0.083 %) nebulizer solution 2.5 mg: 2.5 mg | RESPIRATORY_TRACT | @ 16:00:00

## 2020-11-18 MED ADMIN — ascorbic acid (vitamin C) (VITAMIN C) tablet 1,000 mg: 1000 mg | ORAL | @ 13:00:00

## 2020-11-18 MED ADMIN — ciprofloxacin HCl (CIPRO) tablet 750 mg: 750 mg | ORAL | @ 04:00:00 | Stop: 2020-12-01

## 2020-11-18 MED ADMIN — azithromycin (ZITHROMAX) tablet 250 mg: 250 mg | ORAL | @ 13:00:00 | Stop: 2020-12-18

## 2020-11-18 MED ADMIN — albuterol 2.5 mg /3 mL (0.083 %) nebulizer solution 2.5 mg: 2.5 mg | RESPIRATORY_TRACT | @ 02:00:00

## 2020-11-18 MED ADMIN — bismuth subsalicylate (PEPTO-BISMOL) oral suspension: 30 mL | ORAL | @ 16:00:00

## 2020-11-18 MED ADMIN — cefTAZidime (FORTAZ) 2 g in sodium chloride 0.9 % (NS) 100 mL IVPB-MBP: 2 g | INTRAVENOUS | @ 19:00:00 | Stop: 2020-12-02

## 2020-11-18 NOTE — Unmapped (Signed)
Pt is on 2L/[place] his home regimen is doing the vest 20 min twice a day and doing the [place].  Pt has a stron productive cough of moderate amounts of thick light brown secretions pt states he has been coughing non stop no signs of respiratory distress noted

## 2020-11-18 NOTE — Unmapped (Addendum)
To do:  Repage pulm to ask for recs.    [ ]  14 total days of antibiotics, provided 10 additional days at discharge    Bronchiectasis Flare - Pseudomonas Colonization - Rhinovirus Infection:  Follows with Dr. Garner Nash. History of bronchiectasis with known Pseudomonas colonization. Presented with increased cough, sputum, change in sputum purulence, worsening dyspnea, and fatigue, overall consistent with a bronchiectasis flare. CXR similar to prior, corresponding with bronchiectasis. Found to have Rhinovirus as well, likely exacerbating factor. Recent LRCx (8/11) redemonstrating MDR Pseudomonas. Will proceed with bronchiectasis flare treatment per his outpatient pulmonologist, treating through levofloxacin resistance. Will also support through Rhinovirus infection. Continue home meds at this point and ceftazidime and ciprofloxacin.     Home regimen:  -- Continue Home azithromycin  -- Continue Home inhaled tobramycin, will switch to Colistin on Sept 1  -- Continue Home albuterol  -- Continue Home Incruse-Ellipta   -- Continue Home dornase alfa  -- Continue Home equivalent, Breo-ellipta  -- Continue Home montelukast  -- Continue Home NS 7% nebulized     Elevated Creatinine:  Creatinine elevated to 1.19 on admission, although appears his baseline is near this point. Suspect pre-renal etiology contributing to possible elevation. Downtrended to 1.05. Will CTM

## 2020-11-18 NOTE — Unmapped (Signed)
A&OX4. VSS. Pain controlled with current pain regimen. Droplet and contact precautions maintained. Plan of care reviewed with patient, questions answered.

## 2020-11-18 NOTE — Unmapped (Signed)
Internal Medicine (MEDW) History & Physical    Assessment & Plan:   Jordan [name] is a 65 y.o. male with PMHx of bronchiectasis s/p resection, chronic respiratory failure on 2L oxygen, GERD that presented to [facility] with bronchiectasis flare.     Principal Problem:    Bronchiectasis (CMS-HCC)  Active Problems:    Chronic respiratory failure with hypoxia (CMS-HCC)  Resolved Problems:    * No resolved hospital problems. *    Bronchiectasis Flare - Pseudomonas Colonization - Rhinovirus Infection:  Follows with Dr. Garner Nash. History of bronchiectasis with known Pseudomonas colonization. Presented with increased cough, sputum, change in sputum purulence, worsening dyspnea, and fatigue, overall consistent with a bronchiectasis flare. CXR similar to prior, corresponding with bronchiectasis. Found to have Rhinovirus as well, likely exacerbating factor. Recent LRCx (8/11) redemonstrating MDR Pseudomonas. Will proceed with bronchiectasis flare treatment per his outpatient pulmonologist, treating through levofloxacin resistance. Will also support through Rhinovirus infection.  -- Initiate cefepime 2g q8h   -- Initiate ciprofloxacin 750 mg BID  -- Home azithromycin  -- Home inhaled tobramycin, reportedly will transition to off month soon  -- Home albuterol  -- Home Incruse-Ellipta   -- Home dornase alfa  -- Home equivalent, Breo-ellipta  -- Home montelukast  -- Home NS 7% nebulized  -- Aerobika BID per RT  -- EKG for QTc given prolonging medications above  -- Consult Pulm in AM    Elevated Creatinine:  Creatinine elevated to 1.19 on admission, although appears his baseline is near this point. Suspect pre-renal etiology contributing to possible elevation.   -- Daily BMP    GERD:  -- Home ppi    Daily Checklist:  Diet: Regular Diet  DVT PPx: Lovenox 40mg  q24h  Electrolytes: Replete Potassium to >/= 3.6 and Magnesium to >/= 1.8  Code Status: Full Code    Chief Concern:   Bronchiectasis (CMS-HCC)    Subjective:   HPI:  Jordan [name] is a 65 y.o. male with bronchiectasis s/p resection, chronic respiratory failure on 2L oxygen, GERD that presented to [facility] with bronchiectasis flare.    Patient presenting with increased cough, increased sputum production with change in consistency (brownish),  dyspnea, and generalized fatigue.  He has not required any change in his oxygen requirement, with a home baseline of 2 L.  He has not had any hemoptysis.  He has not had any fevers, headaches, chest pain, abdominal pain, or lower extremity swelling.    He does state that he developed the symptoms after attending a public event yesterday morning.  He was engaged and walking across several fields and was involved with a number of unknown contacts.  Believes that this may have triggered his symptoms.  He has been having difficulty with sleeping due to his persistent cough.  Because of these continuing symptoms he decided to come to ED.    He does have a history of chronic bronchiectasis.  He follows with [facility] pulmonology, with Dr. Garner Nash.  It has been sometime since his last exacerbation.  He is known to be chronically colonized with Pseudomonas.  He has been consistent with his medication regimen.  He is currently taking inhaled tobramycin this month which he alternates.  He has been engaging with pulmonary rehab which he feels like has been doing well for him.    In the ED: Vital signs stable, on 2 L nasal cannula.  CMP with creatinine 1.19. CBC within normal limits. RPP positive for rhinovirus, negative for COVID-19    Designated  Healthcare Decision Maker:  Mr. Fayson currently has decisional capacity for healthcare decision-making and is able to designate a surrogate healthcare decision maker. Mr. Keenum designated healthcare decision maker(s) is/are Jordan [name] (the patient's spouse) as denoted by stated patient preference.    Allergies:  Gentamicin, Tobramycin, and Morphine    Medications:   Prior to Admission medications    Medication Dose, Route, Frequency   albuterol 2.5 mg /3 mL (0.083 %) nebulizer solution 2.5 mg, Nebulization, 2 times a day (standard)   albuterol HFA 90 mcg/actuation inhaler 2 puffs, Inhalation, 4 times a day   alcohol swabs (ALCOHOL PREP PADS) PadM Use as directed with inhaled antibiotics   ascorbic acid, vitamin C, (VITAMIN C) 1000 MG tablet 1,000 mg, Oral, Daily (standard)   azithromycin (ZITHROMAX) 250 MG tablet 250 mg, Oral, Daily (standard)   cholecalciferol, vitamin D3-50 mcg, 2,000 unit,, 50 mcg (2,000 unit) cap 2,000 Units, Oral   coenzyme Q10 300 mg cap 10-300 mg, Oral   colistimethate (COLYMYCIN) 150 mg injection Inject 2 mL sterile water for injection to mix colistin vial, then draw up 2 mL (150mg ) and inhale 2 times a day, 28 days on and 28 days off.   dornase alfa (PULMOZYME) 1 mg/mL nebulizer solution Inhale 1 ampule (2.5 mg) daily. Use at least 30-60 minutes before airway clearance, or after airway clearance   empty container Misc USE AS DIRECTED   ferrous sulfate 325 (65 FE) MG tablet 325 mg, Oral, 2 times a day, BID   HYDROcodone-acetaminophen (NORCO) 7.5-325 mg per tablet 1 tablet, Oral, 3 times a day   HYDROcodone-homatropine (HYCODAN) 5-1.5 mg/5 mL syrup 5 mL, Oral, Every 4 hours PRN   montelukast (SINGULAIR) 10 mg tablet 10 mg, Oral, Nightly   multivitamin-minerals-lutein Tab 1 tablet, Oral, Daily (standard)   nebulizers (LC PLUS) Misc use with Colistin   nebulizers (LC PLUS) Misc Use as directed with hypertonic saline 10%   nebulizers Misc Use as directed with tobramycin   needle, disp, 21 G (BD REGULAR BEVEL NEEDLES) 21 gauge x 1 1/2 Ndle Use as directed with inhaled Colistin   omeprazole (PRILOSEC) 20 MG capsule 20 mg, Oral, 2 times a day   sodium chloride (BD POSIFLUSH NORMAL SALINE 0.9) 0.9 % injection Inject 2mL of 0.9%NaCl into colistin vial & gently mix. After withdrawing colistin dose, add an additional 1mL of 0.9%NaCl to neb cup with the colistin dose.   sodium chloride 10 % Nebu 5 mL, Nebulization, 2 times a day (standard)   sterile water Soln Use 2mL to mix Colistin,then add additional 2mL to neb cup with 2mL of mixed colistin.Inhale twice daily 28 days on and 28 days off.   SYMBICORT 160-4.5 mcg/actuation inhaler INHALE 2 PUFFS INTO THE LUNGS 2 TIMES A DAY.   syringe with needle (BD LUER-LOK SYRINGE) 3 mL 20 gauge x 1 1/2 Syrg For use with inhaled antibiotic (Colistin)   syringe with needle (BD LUER-LOK SYRINGE) 3 mL 20 gauge x 1 Syrg For use with inhaled antibiotic (Colistin)   tiotropium bromide (SPIRIVA RESPIMAT) 2.5 mcg/actuation inhalation mist Inhale 2 puffs daily.   tobramycin, PF, ([name]) 300 mg/5 mL nebulizer solution 300 mg, Nebulization, Every 12 hours, 28 days on and 28 days off.   zinc gluconate 50 mg (7 mg elemental zinc) tablet 50 mg, Oral       Medical History:  Past Medical History:   Diagnosis Date   ??? Abscess of lung (CMS-HCC) 11/06/2010    CT Chest 10/22/10 12/05/2010 right  thoracotomy resection of right middle lobe and resection of right lower lobe abscess    ??? Arthritis     back   ??? Biceps tendon tear 2013    left side   ??? Bronchiectasis (CMS-HCC)     chronic psuedomonas infection   ??? Degenerative joint disease of left acromioclavicular joint    ??? GERD (gastroesophageal reflux disease)    ??? Hypertension    ??? Obstructive sleep apnea on CPAP 03/14/13    AHI 33.5, on BiPAP 12/8 based on sleep study 09/2013   ??? Pneumonia 2012    Lung abcess    ??? PONV (postoperative nausea and vomiting)    ??? Prostate cancer (CMS-HCC) 09/09/2017   ??? Rotator cuff injury left   ??? Vertigo        Surgical History:  Past Surgical History:   Procedure Laterality Date   ??? APPENDECTOMY  1969   ??? BACK SURGERY     ??? BRONCHOSCOPY  10/19/2010    [facility]   ??? CARPAL TUNNEL RELEASE  1999   ??? LUNG REMOVAL, PARTIAL Right 10/2010    RML and RLL partially resected   ??? MENISCECTOMY  2011   ??? NECK SURGERY  1993, 1999, 2002   ??? PR GERD TST W/ MUCOS IMPEDE ELECTROD,>1HR N/A 07/06/2012    Procedure: ESOPHAGEAL FUNCTION TEST, GASTROESOPHAGEAL REFLUX TEST W/ NASAL CATHETER INTRALUMINAL IMPEDANCE ELECTRODE(S) PLACEMENT, RECORDING, ANALYSIS AND INTERPRETATION; PROLONGED;  Surgeon: None None;  [facility];  Service: Gastroenterology   ??? PR OPEN TREAT RIB FRACTURE W/INT FIXATION, UNILATERAL, 1-2 RIBS Right 03/19/2015    Procedure: OPEN TREATMENT OF RIB FRACTURE REQUIRING INTERNAL FIXATION, UNILATERAL; 1-2 RIBS;  Surgeon: Evert Kohl, MD;  Location: MAIN OR [facility];  Service: Cardiothoracic   ??? PR THORACOTOMY W/THERAP WEDGE RESEXN ADDL IPSILATRL Right 08/07/2013    Procedure: THORACOTOMY; WITH THERAPEUTIC LOBECTOMY OF RIGHT MIDDLE AND RIGHT LOWER LOBE RESECTION , EACH ADDITIONAL RESECTION, IPSILATERAL;  Surgeon: Alvester Chou, MD;  Location: MAIN OR [facility];  Service: Cardiothoracic   ??? TONSILLECTOMY  1965       Family History:   Family History   Problem Relation Age of Onset   ??? Alzheimer's disease Mother    ??? Stroke Mother    ??? Bronchiectasis  Brother         presumed   ??? Bronchiectasis  Brother         presumed   ??? Liver disease Father    ??? Diabetes Father    ??? Heart failure Father    ??? Bronchiectasis  Daughter    ??? Asthma Son         childhood asthma   ??? Clotting disorder Neg Hx    ??? Anesthesia problems Neg Hx        Social History:  The patient lives with family    Social History     Tobacco Use   ??? Smoking status: Never Smoker   ??? Smokeless tobacco: Never Used   Substance Use Topics   ??? Alcohol use: No     Alcohol/week: 0.0 standard drinks   ??? Drug use: No        Review of Systems:  10 systems were reviewed and are negative unless otherwise mentioned in the HPI    Objective:   Physical Exam:  Temp:  [36.7 ??C (98.1 ??F)-36.9 ??C (98.4 ??F)] 36.8 ??C (98.2 ??F)  Heart Rate:  [80-108] 80  Resp:  [20-22] 20  BP: (115-132)/(78-85)  128/85  SpO2:  [94 %-95 %] 95 %    Gen: Chronically-ill appearing, fatigued, no acute distress, answers questions appropriately  Eyes: Sclera anicteric, EOMI, PERRLA,  HENT: atraumatic, normocephalic, MMM. OP w/o erythema or exudate   Neck: no cervical lymphadenopathy or thyromegaly, no JVD  Heart: RRR, S1, S2, no M/R/G, no chest wall tenderness  Lungs: CTAB, no crackles or wheezes, no use of accessory muscles, good air movement throughout, intermittent coughing with significant sputum production  Abdomen: Normoactive bowel sounds, soft, NTND, no rebound/guarding  Extremities: no clubbing, cyanosis, or edema: pulses are +2 in bilateral upper and lower extremities  Neuro: CN II-XI grossly intact. No focal deficits.  Skin:  No rashes, lesions on clothed exam  Psych: Alert, oriented, normal mood and affect.     Labs/Studies/Imaging:  Labs, Studies, Imaging from the last 24hrs per EMR and personally reviewed

## 2020-11-18 NOTE — Unmapped (Signed)
Care Management  Initial Transition Planning Assessment  Type of Residence: Mailing Address:  [address]  [place] [address]  Contacts: Accompanied by: Family member  Patient Phone Number: [phone]          Medical Provider(s): Barbaraann Boys, MD  Reason for Admission: Admitting Diagnosis:  No admission diagnoses are documented for this encounter.  Past Medical History:   has a past medical history of Abscess of lung (CMS-HCC) (11/06/2010), Arthritis, Biceps tendon tear (2013), Bronchiectasis (CMS-HCC), Degenerative joint disease of left acromioclavicular joint, GERD (gastroesophageal reflux disease), Hypertension, Obstructive sleep apnea on CPAP (03/14/13), Pneumonia (2012), PONV (postoperative nausea and vomiting), Prostate cancer (CMS-HCC) (09/09/2017), Rotator cuff injury (left), and Vertigo.  Past Surgical History:   has a past surgical history that includes Back surgery; Tonsillectomy (1965); Appendectomy (1969); pr gerd tst w/ mucos impede electrod,>1hr (N/A, 07/06/2012); Lung removal, partial (Right, 10/2010); Bronchoscopy (10/19/2010); Neck surgery (1993, 1999, 2002); Carpal tunnel release (1999); Meniscectomy (2011); pr thoracotomy w/therap wedge resexn addl ipsilatrl (Right, 08/07/2013); and pr open treat rib fracture w/int fixation, unilateral, 1-2 ribs (Right, 03/19/2015).   Previous admit date: 04/14/2017    Primary Insurance- Payor: Advertising copywriter MEDICARE ADV / Plan: Advertising copywriter MEDICARE ADV / Product Type: *No Product type* /   Secondary Insurance - None  Prescription Coverage - Medicare Adv  Preferred Pharmacy - [facility] [place], [place] - [address]  [facility] [place], [place] - [address] TO [address]  [facility] [place] - [address]  [id number] [place] [address]  [facility] [place] - [address]  [id number] - [place], [place] - [address] AT [address]    Transportation home: Private vehicle (wife to drive home)                    General  Care Manager assessed the patient by : In person interview with patient, Medical record review  Orientation Level: Oriented X4  Functional level prior to admission: Independent  Reason for referral: Discharge Planning    Contact/Decision Maker  Extended Emergency Contact Information  Primary Emergency Contact: Ellin Saba  Address: [address]           [place], [place] [address] [place]  Home Phone: [phone]  Mobile Phone: [phone]  Relation: Spouse    Legal Next of Kin / Guardian / POA / Advance Directives     HCDM (HCPOA): Suren, Payne - Spouse - [phone]    Advance Directive (Medical Treatment)  Does patient have an advance directive covering medical treatment?: Patient does not have advance directive covering medical treatment.  Reason patient does not have an advance directive covering medical treatment:: Patient does not wish to complete one at this time.    Health Care Decision Maker [HCDM] (Medical & Mental Health Treatment)  Healthcare Decision Maker: Patient does not wish to appoint a Health Care Decision Maker at this time  Information offered on HCDM, Medical & Mental Health advance directives:: Patient declined information.         Readmission Information    Have you been hospitalized in the last 30 days?: No       Patient Information  Lives with: Spouse/significant other  Type of Residence: Private residence        Location/Detail: [address], [place], [place] [address]    Support Systems/Concerns: Significant Other    Responsibilities/Dependents at home?: No    Home Care services in place prior to admission?: No    Equipment Currently Used at Home: oxygen (2L baseline use)  Current HME Agency (Name/Phone #): [facility]    Currently receiving outpatient dialysis?: No       Financial Information       Need for financial assistance?: No       Social Determinants of Health     Tobacco Use: Low Risk    ??? Smoking Tobacco Use: Never Smoker   ??? Smokeless Tobacco Use: Never Used   Alcohol Use: Not on file   Financial Resource Strain: Low Risk    ??? Difficulty of Paying Living Expenses: Not very hard   Food Insecurity: No Food Insecurity   ??? Worried About Running Out of Food in the Last Year: Never true   ??? Ran Out of Food in the Last Year: Never true   Transportation Needs: No Transportation Needs   ??? Lack of Transportation (Medical): No   ??? Lack of Transportation (Non-Medical): No   Physical Activity: Not on file   Stress: Not on file   Social Connections: Not on file   Intimate Partner Violence: Not on file   Depression: Not on file   Housing/Utilities: Low Risk    ??? Within the past 12 months, have you ever stayed: outside, in a car, in a tent, in an overnight shelter, or temporarily in someone else's home (i.e. couch-surfing)?: No   ??? Are you worried about losing your housing?: No   ??? Within the past 12 months, have you been unable to get utilities (heat, electricity) when it was really needed?: No   Substance Use: Not on file   Health Literacy: Not on file       Complex Discharge Information    Is patient identified as a difficult/complex discharge?: No      Discharge Needs Assessment  Concerns to be Addressed: no discharge needs identified    Clinical Risk Factors: > 65, Multiple Diagnoses (Chronic)    Barriers to taking medications: No    Prior overnight hospital stay or ED visit in last 90 days: No       Anticipated Changes Related to Illness: none    Equipment Needed After Discharge: none    Discharge Facility/Level of Care Needs:      Readmission  Risk of Unplanned Readmission Score:  %  Predictive Model Details          17% (Medium)  Factor Value    Calculated 11/18/2020 10:59 37% Number of active Rx orders 49    [place] Risk of Unplanned Readmission Model 10% ECG/EKG order present in last 6 months     10% Latest calcium low (8.6 mg/dL)     9% Latest BUN high (29 mg/dL)     7% Imaging order present in last 6 months     6% Number of ED visits in last six months 1     6% Age 15     5% Diagnosis of deficiency anemia present     5% Active anticoagulant Rx order present     3% Charlson Comorbidity Index 2     2% Future appointment scheduled     1% Active ulcer medication Rx order present     1% Current length  of stay 0.568 days      Readmitted Within the Last 30 Days? (No if blank)   Patient at risk for readmission?: No    Discharge Plan  Screen findings are: Care Manager reviewed the plan of the patient's care with the Multidisciplinary Team. No discharge planning needs identified at this time. Care Manager will continue to manage plan and monitor patient's progress with the team.    Expected Discharge Date: 11/19/2020    Expected Transfer from Critical Care:  (N/A)    Quality data for continuing care services shared with patient and/or representative?: N/A  Patient and/or family were provided with choice of facilities / services that are available and appropriate to meet post hospital care needs?: N/A       Initial Assessment complete?: Yes      Mauri Brooklyn, RN  November 18, 2020 11:07 AM

## 2020-11-18 NOTE — Unmapped (Addendum)
Admit 65 yo male to room 1222 from the ED.  Patient presented to the ED with a cough that started two nights ago and has become increasingly worse.  Patient also has a history of bronchiectasis.  Patient states hes been coughing hard enough to vomit and feels weaker than normal.  Will admit for IV antibiotics and to monitor overnight.    Problem: Adult Inpatient Plan of Care  Goal: Plan of Care Review  Outcome: Ongoing - Unchanged  Goal: Patient-Specific Goal (Individualized)  Outcome: Ongoing - Unchanged  Goal: Absence of Hospital-Acquired Illness or Injury  Outcome: Ongoing - Unchanged  Goal: Optimal Comfort and Wellbeing  Outcome: Ongoing - Unchanged  Goal: Readiness for Transition of Care  Outcome: Ongoing - Unchanged  Goal: Rounds/Family Conference  Outcome: Ongoing - Unchanged     Problem: Infection  Goal: Absence of Infection Signs and Symptoms  Outcome: Ongoing - Unchanged

## 2020-11-19 DIAGNOSIS — J471 Bronchiectasis with (acute) exacerbation: Secondary | ICD-10-CM | POA: Diagnosis not present

## 2020-11-19 DIAGNOSIS — B348 Other viral infections of unspecified site: Secondary | ICD-10-CM | POA: Diagnosis not present

## 2020-11-19 DIAGNOSIS — J9611 Chronic respiratory failure with hypoxia: Secondary | ICD-10-CM | POA: Diagnosis not present

## 2020-11-19 LAB — BASIC METABOLIC PANEL
ANION GAP: 8 mmol/L (ref 5–14)
BLOOD UREA NITROGEN: 21 mg/dL (ref 9–23)
BUN / CREAT RATIO: 21
CALCIUM: 8.2 mg/dL — ABNORMAL LOW (ref 8.7–10.4)
CHLORIDE: 105 mmol/L (ref 98–107)
CO2: 24 mmol/L (ref 20.0–31.0)
CREATININE: 1.01 mg/dL
EGFR CKD-EPI (2021) MALE: 82 mL/min/{1.73_m2} (ref >=60)
GLUCOSE RANDOM: 98 mg/dL (ref 70–179)
POTASSIUM: 3.5 mmol/L (ref 3.4–4.8)
SODIUM: 137 mmol/L (ref 135–145)

## 2020-11-19 LAB — CBC
HEMATOCRIT: 37.9 % — ABNORMAL LOW (ref 39.0–48.0)
HEMOGLOBIN: 12.9 g/dL (ref 12.9–16.5)
MEAN CORPUSCULAR HEMOGLOBIN CONC: 34 g/dL (ref 32.0–36.0)
MEAN CORPUSCULAR HEMOGLOBIN: 30.7 pg (ref 25.9–32.4)
MEAN CORPUSCULAR VOLUME: 90.3 fL (ref 77.6–95.7)
MEAN PLATELET VOLUME: 8.5 fL (ref 6.8–10.7)
PLATELET COUNT: 135 10*9/L — ABNORMAL LOW (ref 150–450)
RED BLOOD CELL COUNT: 4.2 10*12/L — ABNORMAL LOW (ref 4.26–5.60)
RED CELL DISTRIBUTION WIDTH: 12.7 % (ref 12.2–15.2)
WBC ADJUSTED: 6.1 10*9/L (ref 3.6–11.2)

## 2020-11-19 LAB — MAGNESIUM: MAGNESIUM: 2 mg/dL (ref 1.6–2.6)

## 2020-11-19 MED ADMIN — sodium chloride 7% nebulizer solution 4 mL: 4 mL | RESPIRATORY_TRACT | @ 21:00:00

## 2020-11-19 MED ADMIN — cefTAZidime (FORTAZ) 2 g in sodium chloride 0.9 % (NS) 100 mL IVPB-MBP: 2 g | INTRAVENOUS | @ 02:00:00 | Stop: 2020-12-02

## 2020-11-19 MED ADMIN — ampicillin-sulbactam (UNASYN) 3 g in sodium chloride 0.9 % (NS) 100 mL IVPB connector bag: 3 g | INTRAVENOUS | @ 13:00:00 | Stop: 2020-11-24

## 2020-11-19 MED ADMIN — enoxaparin (LOVENOX) syringe 40 mg: 40 mg | SUBCUTANEOUS | @ 02:00:00

## 2020-11-19 MED ADMIN — prochlorperazine (COMPAZINE) tablet 10 mg: 10 mg | ORAL | @ 13:00:00

## 2020-11-19 MED ADMIN — prochlorperazine (COMPAZINE) injection 5 mg: 5 mg | INTRAVENOUS | @ 01:00:00

## 2020-11-19 MED ADMIN — azithromycin (ZITHROMAX) tablet 250 mg: 250 mg | ORAL | @ 12:00:00 | Stop: 2020-12-18

## 2020-11-19 MED ADMIN — HYDROcodone-homatropine (HYCODAN) 5-1.5 mg/5 mL (5 mL) syrup 5 mL: 5 mL | ORAL | @ 07:00:00 | Stop: 2020-12-02

## 2020-11-19 MED ADMIN — HYDROcodone-acetaminophen (NORCO) 5-325 mg per tablet 1.5 tablet: 1.5 | ORAL | @ 21:00:00 | Stop: 2020-12-02

## 2020-11-19 MED ADMIN — albuterol 2.5 mg /3 mL (0.083 %) nebulizer solution 2.5 mg: 2.5 mg | RESPIRATORY_TRACT | @ 13:00:00

## 2020-11-19 MED ADMIN — HYDROcodone-homatropine (HYCODAN) 5-1.5 mg/5 mL (5 mL) syrup 5 mL: 5 mL | ORAL | @ 02:00:00 | Stop: 2020-12-02

## 2020-11-19 MED ADMIN — tobramycin (PF) (TOBI) 300 mg/5 mL nebulizer solution 300 mg: 300 mg | RESPIRATORY_TRACT | @ 02:00:00 | Stop: 2020-11-21

## 2020-11-19 MED ADMIN — ampicillin-sulbactam (UNASYN) 3 g in sodium chloride 0.9 % (NS) 100 mL IVPB connector bag: 3 g | INTRAVENOUS | @ 16:00:00 | Stop: 2020-11-24

## 2020-11-19 MED ADMIN — albuterol 2.5 mg /3 mL (0.083 %) nebulizer solution 2.5 mg: 2.5 mg | RESPIRATORY_TRACT | @ 16:00:00

## 2020-11-19 MED ADMIN — ciprofloxacin HCl (CIPRO) tablet 750 mg: 750 mg | ORAL | @ 02:00:00 | Stop: 2020-12-01

## 2020-11-19 MED ADMIN — tobramycin (PF) (TOBI) 300 mg/5 mL nebulizer solution 300 mg: 300 mg | RESPIRATORY_TRACT | @ 13:00:00 | Stop: 2020-11-21

## 2020-11-19 MED ADMIN — ascorbic acid (vitamin C) (VITAMIN C) tablet 1,000 mg: 1000 mg | ORAL | @ 12:00:00

## 2020-11-19 MED ADMIN — HYDROcodone-homatropine (HYCODAN) 5-1.5 mg/5 mL (5 mL) syrup 5 mL: 5 mL | ORAL | @ 15:00:00 | Stop: 2020-12-02

## 2020-11-19 MED ADMIN — cholecalciferol (vitamin D3 25 mcg (1,000 units)) tablet 50 mcg: 50 ug | ORAL | @ 12:00:00

## 2020-11-19 MED ADMIN — sodium chloride 7% nebulizer solution 4 mL: 4 mL | RESPIRATORY_TRACT | @ 12:00:00

## 2020-11-19 MED ADMIN — traZODone (DESYREL) tablet 50 mg: 50 mg | ORAL | @ 03:00:00

## 2020-11-19 MED ADMIN — ampicillin-sulbactam (UNASYN) 3 g in sodium chloride 0.9 % (NS) 100 mL IVPB connector bag: 3 g | INTRAVENOUS | @ 21:00:00 | Stop: 2020-11-24

## 2020-11-19 MED ADMIN — ferrous sulfate tablet 325 mg: 325 mg | ORAL | @ 19:00:00

## 2020-11-19 MED ADMIN — ferrous sulfate tablet 325 mg: 325 mg | ORAL | @ 12:00:00

## 2020-11-19 MED ADMIN — fluticasone furoate-vilanteroL (BREO ELLIPTA) 200-25 mcg/dose inhaler 1 puff: 1 | RESPIRATORY_TRACT | @ 13:00:00

## 2020-11-19 MED ADMIN — pantoprazole (PROTONIX) EC tablet 20 mg: 20 mg | ORAL | @ 12:00:00

## 2020-11-19 MED ADMIN — albuterol 2.5 mg /3 mL (0.083 %) nebulizer solution 2.5 mg: 2.5 mg | RESPIRATORY_TRACT | @ 21:00:00

## 2020-11-19 MED ADMIN — dornase alfa (PULMOZYME) 1 mg/mL solution 2.5 mg: 2.5 mg | RESPIRATORY_TRACT | @ 13:00:00

## 2020-11-19 MED ADMIN — umeclidinium (INCRUSE ELLIPTA) 62.5 mcg/actuation inhaler 1 puff: 1 | RESPIRATORY_TRACT | @ 13:00:00

## 2020-11-19 MED ADMIN — montelukast (SINGULAIR) tablet 10 mg: 10 mg | ORAL | @ 02:00:00

## 2020-11-19 MED ADMIN — HYDROcodone-acetaminophen (NORCO) 5-325 mg per tablet 1.5 tablet: 1.5 | ORAL | @ 12:00:00 | Stop: 2020-12-02

## 2020-11-19 MED ADMIN — cefTAZidime (FORTAZ) 2 g in sodium chloride 0.9 % (NS) 100 mL IVPB-MBP: 2 g | INTRAVENOUS | @ 10:00:00 | Stop: 2020-11-19

## 2020-11-19 MED ADMIN — sodium chloride 7% nebulizer solution 4 mL: 4 mL | RESPIRATORY_TRACT | @ 16:00:00

## 2020-11-19 MED ADMIN — HYDROcodone-acetaminophen (NORCO) 5-325 mg per tablet 1.5 tablet: 1.5 | ORAL | @ 02:00:00 | Stop: 2020-12-02

## 2020-11-19 NOTE — Unmapped (Signed)
Continue airway clearance as outlined in consult note.   Ampicillin/Sulbactam then switch to oral co-amoxiclav when nausea has improved.     Oralia Manis  Fellow in Pulmonary Medicine.

## 2020-11-19 NOTE — Unmapped (Addendum)
OCCUPATIONAL THERAPY  Evaluation (11/19/20 1231)    Patient Name:  Jordan [name]       Medical Record Number: [id number]   Date of Birth: 02/01/1956  Sex: Male          OT Treatment Diagnosis:  deconditioning    Assessment  Problem List: Decreased endurance, Shortness of breath  Assessment: Jordan [name] is a 65 y.o. male with PMHx of bronchiectasis s/p resection, chronic respiratory failure on 2L oxygen, GERD that presented to [facility] with bronchiectasis flare. Pt is close to functional baseline and is able to complete self-care and functional mobility with little difficulty. He is currently experiencing endurance deficits and SOB that affect his ability to complete his daily routine. Pt's wife states she is able to assist with ADLs as needed. No further OT is indicated at this time. Based on consideration of pt's occupational profile, assessment review, level of clinical decision making involved, and intervention plan, this pt is considered to be a moderate complexity case.  Today's Interventions: supine <> sit, sit <> stand, LB dressing, and functional mobility. OT educating pt re: energy conservation, pursed lip breathing, shower chair for bathing, post-acute recs, role of OT, and OT POC.    Activity Tolerance During Today's Session  Tolerated treatment well    Plan  Planned Frequency of Treatment:  D/C Services for: D/C Services    Post-Discharge Occupational Therapy Recommendations:   OT services not indicated   OT DME Recommendations: Shower chair with back -      GOALS:   Patient and Family Goals: none stated    Prognosis:  Good  Positive Indicators:  PLOF and support from spouse  Barriers to Discharge: Endurance deficits    Subjective  Current Status Pt recieved and left supine in bed with call bell and all needs in reach. RN aware  Prior Functional Status Pt previously completing all ADLs and IADLS independently without and AD. Pt drives    Medical Tests / Procedures: Reviewed in Epic       Patient / Caregiver reports: I feel pretty good    Past Medical History:   Diagnosis Date   ??? Abscess of lung (CMS-HCC) 11/06/2010    CT Chest 10/22/10 12/05/2010 right thoracotomy resection of right middle lobe and resection of right lower lobe abscess    ??? Arthritis     back   ??? Biceps tendon tear 2013    left side   ??? Bronchiectasis (CMS-HCC)     chronic psuedomonas infection   ??? Degenerative joint disease of left acromioclavicular joint    ??? GERD (gastroesophageal reflux disease)    ??? Hypertension    ??? Obstructive sleep apnea on CPAP 03/14/13    AHI 33.5, on BiPAP 12/8 based on sleep study 09/2013   ??? Pneumonia 2012    Lung abcess    ??? PONV (postoperative nausea and vomiting)    ??? Prostate cancer (CMS-HCC) 09/09/2017   ??? Rotator cuff injury left   ??? Vertigo     Social History     Tobacco Use   ??? Smoking status: Never Smoker   ??? Smokeless tobacco: Never Used   Substance Use Topics   ??? Alcohol use: No     Alcohol/week: 0.0 standard drinks      Past Surgical History:   Procedure Laterality Date   ??? APPENDECTOMY  1969   ??? BACK SURGERY     ??? BRONCHOSCOPY  10/19/2010    [facility]   ???  CARPAL TUNNEL RELEASE  1999   ??? LUNG REMOVAL, PARTIAL Right 10/2010    RML and RLL partially resected   ??? MENISCECTOMY  2011   ??? NECK SURGERY  1993, 1999, 2002   ??? PR GERD TST W/ MUCOS IMPEDE ELECTROD,>1HR N/A 07/06/2012    Procedure: ESOPHAGEAL FUNCTION TEST, GASTROESOPHAGEAL REFLUX TEST W/ NASAL CATHETER INTRALUMINAL IMPEDANCE ELECTRODE(S) PLACEMENT, RECORDING, ANALYSIS AND INTERPRETATION; PROLONGED;  Surgeon: None None;  Location: [facility];  Service: Gastroenterology   ??? PR OPEN TREAT RIB FRACTURE W/INT FIXATION, UNILATERAL, 1-2 RIBS Right 03/19/2015    Procedure: OPEN TREATMENT OF RIB FRACTURE REQUIRING INTERNAL FIXATION, UNILATERAL; 1-2 RIBS;  Surgeon: Evert Kohl, MD;  Location: MAIN OR [facility];  Service: Cardiothoracic   ??? PR THORACOTOMY W/THERAP WEDGE RESEXN ADDL IPSILATRL Right 08/07/2013    Procedure: THORACOTOMY; WITH THERAPEUTIC LOBECTOMY OF RIGHT MIDDLE AND RIGHT LOWER LOBE RESECTION , EACH ADDITIONAL RESECTION, IPSILATERAL;  Surgeon: Alvester Chou, MD;  Location: MAIN OR [facility];  Service: Cardiothoracic   ??? TONSILLECTOMY  1965    Family History   Problem Relation Age of Onset   ??? Alzheimer's disease Mother    ??? Stroke Mother    ??? Bronchiectasis  Brother         presumed   ??? Bronchiectasis  Brother         presumed   ??? Liver disease Father    ??? Diabetes Father    ??? Heart failure Father    ??? Bronchiectasis  Daughter    ??? Asthma Son         childhood asthma   ??? Clotting disorder Neg Hx    ??? Anesthesia problems Neg Hx         Gentamicin, Tobramycin, and Morphine     Objective Findings  Precautions / Restrictions  Falls precautions    Weight Bearing  Non-applicable    Required Braces or Orthoses  Non-applicable    Pain  Pt denies pain    Equipment / Environment  Patient not wearing mask for full session, Vascular access (PIV, TLC, Port-a-cath, PICC), Telemetry (2L)    Living Situation  Living Environment: House  Lives With: Spouse  Home Living: Two level home, Stairs to enter without rails, Able to Live on main level with bedroom/bathroom, Stairs to alternate level with rails, Tub/shower unit, Standard height toilet  Number of Stairs to Enter (outside): 2  Rail placement (inside): Bilateral rails  Number of Stairs to Alternate level (inside):  (full flight)  Equipment available at home: None     Cognition   Orientation Level:  Oriented x 4   Arousal/Alertness:  Appropriate responses to stimuli   Attention Span:  Appears intact   Memory:  Appears intact   Following Commands:  Follows all commands and directions without difficulty   Safety Judgment:  Good awareness of safety precautions   Awareness of Errors:  Good awareness of safety precautions   Problem Solving:  Able to problem solve independently    Vision / Hearing   Vision: Wears glasses all the time   Hearing: No deficit identified       Hand Function:  Right Hand Function: Right hand grip strength, ROM and coordination WNL  Left Hand Function: Left hand grip strength, ROM and coordination WNL    Skin Inspection:  Skin Inspection: Intact where visualized    ROM / Strength:  UE ROM/Strength: Left WFL, Right WFL  LE ROM/Strength: Left WFL, Right [facility]  Coordination:  Coordination: WFL    Sensation:  RUE Sensation: RUE intact  LUE Sensation: LUE intact  RLE Sensation: RLE intact  LLE Sensation: LLE intact    Balance:  independent for static and dynamic sitting and standing balance    Functional Mobility  Transfer Assistance Needed: No  Bed Mobility Assistance Needed: No  Ambulation: Pt completed functional mobility at moderate household distances without an AD requiring supervision for pursed lip breathing and rest breaks.    ADLs  ADLs: Modified Independent, Needs assistance with ADLs  ADLs - Needs Assistance: Bathing (anticipate SBA 2/2 endurance)    Vitals / Orthostatics  At Rest: SpO2: 88-92% on 2L O2 via [place]  With Activity: SpO2: Brief drop to mid 80's on 2L. Increased to >90% with pursed lip breathing on 2L O2 via [place]  Orthostatics: asymptomatic    Medical Staff Made Aware: Granville Lewis, RN updated and aware    Occupational Therapy Session Duration  OT Individual [mins]: 25  Reason for Co-treatment: To safely progress mobility       I attest that I have reviewed the above information.  Signed: Bonnita Nasuti, OT  Filed 11/19/2020

## 2020-11-19 NOTE — Unmapped (Signed)
Pulmonary Consult Service  Consult H&P      Primary Service:   Medicine - W  Primary Service Attending:  Donnal Moat, *  Reason for Consult:   Cough/Exacerbation of bronchiectasis    ASSESSMENT and PLAN     Jordan [name] is a 65 y.o. male with a PMHx of Bronchiectasis with chronic Pseudomonas colonization, chronic hypoxemic respiratory failure    Exacerbation of Bronchiectasis  Cough is more severe than usual exacerbation and associated with some post tussive emesis. He is very fatigued with the cough. Suggestive of viral induced exacerbation and rhinovirus/enterovirus in keeping with his symptoms. There is also the possibility that anaerobic infection could be a consideration now given his episodes of emesis and would advise treating for same. Ongoing airway clearance of paramount importance, vest maybe not the best option now given nausea. His WBC is normal and he is apyrexic.     -Would advise not treating for PsA at this time but rather anaerobic coverage, oral co-amoxiclav if tolerates with nausea (unasyn if does not tolerate oral)  - Continue airway clearance in below order:  1) albuterol neb 4x RT  2) HTS 10% (7% if 10% not available) 4x RT  3) vibralung 4x RT  4) Pulmozyme daily  5) Inhaled antibiotics 2x daily  6) Breo Ellipta and Incruse Ellipta inhaler daily  - As above would favor vibralung over vest for clearance now given nausea.   - Lower respiratory culture    Jordan [name] was seen, examined and discussed with  Dr. Judie Petit. Rolm Baptise   Thank you for involving [place] in his care. We look forward to following with you.  Please don't hesitate to page [place] with any questions or concerns at [phone] (pulmonary consult fellow).    Oralia Manis  Fellow in Pulmonary Medicine    HISTORY:     History of Present Illness:  Jordan [name] is a 65 y.o. male with PMHx of Bronchiectasis with chronic Pseudomonas colonization, chronic hypoxemic respiratory failure.     Jordan [name] reports waking on Sunday with significant coughing. This resulted in some emesis. He describes the cough as barking. His sputum volume is stable but slightly darker in color. He has not had any hemoptysis. He felt hot at home but has not had any fevers. He has not had any chest tightness or pleuritic chest pain. He feels fatigued. Today he has some more persistent nausea. He has had a number of episodes of emesis. No hematemesis.     Past Medical History:  Past Medical History:   Diagnosis Date    Abscess of lung (CMS-HCC) 11/06/2010    CT Chest 10/22/10 12/05/2010 right thoracotomy resection of right middle lobe and resection of right lower lobe abscess     Arthritis     back    Biceps tendon tear 2013    left side    Bronchiectasis (CMS-HCC)     chronic psuedomonas infection    Degenerative joint disease of left acromioclavicular joint     GERD (gastroesophageal reflux disease)     Hypertension     Obstructive sleep apnea on CPAP 03/14/13    AHI 33.5, on BiPAP 12/8 based on sleep study 09/2013    Pneumonia 2012    Lung abcess     PONV (postoperative nausea and vomiting)     Prostate cancer (CMS-HCC) 09/09/2017    Rotator cuff injury left    Vertigo      Past Surgical History:  Procedure Laterality Date    APPENDECTOMY  1969    BACK SURGERY      BRONCHOSCOPY  10/19/2010    [facility]    CARPAL TUNNEL RELEASE  1999    LUNG REMOVAL, PARTIAL Right 10/2010    RML and RLL partially resected    MENISCECTOMY  2011    NECK SURGERY  1993, 1999, 2002    PR GERD TST W/ MUCOS IMPEDE ELECTROD,>1HR N/A 07/06/2012    Procedure: ESOPHAGEAL FUNCTION TEST, GASTROESOPHAGEAL REFLUX TEST W/ NASAL CATHETER INTRALUMINAL IMPEDANCE ELECTRODE(S) PLACEMENT, RECORDING, ANALYSIS AND INTERPRETATION; PROLONGED;  Surgeon: None None;  Location: [facility];  Service: Gastroenterology    PR OPEN TREAT RIB FRACTURE W/INT FIXATION, UNILATERAL, 1-2 RIBS Right 03/19/2015    Procedure: OPEN TREATMENT OF RIB FRACTURE REQUIRING INTERNAL FIXATION, UNILATERAL; 1-2 RIBS; Surgeon: Evert Kohl, MD;  Location: MAIN OR [place];  Service: Cardiothoracic    PR THORACOTOMY W/THERAP WEDGE RESEXN ADDL IPSILATRL Right 08/07/2013    Procedure: THORACOTOMY; WITH THERAPEUTIC LOBECTOMY OF RIGHT MIDDLE AND RIGHT LOWER LOBE RESECTION , EACH ADDITIONAL RESECTION, IPSILATERAL;  Surgeon: Alvester Chou, MD;  Location: MAIN OR [facility];  Service: Cardiothoracic    TONSILLECTOMY  1965       Other History:  Family History   Problem Relation Age of Onset    Alzheimer's disease Mother     Stroke Mother     Bronchiectasis  Brother         presumed    Bronchiectasis  Brother         presumed    Liver disease Father     Diabetes Father     Heart failure Father     Bronchiectasis  Daughter     Asthma Son         childhood asthma    Clotting disorder Neg Hx     Anesthesia problems Neg Hx      Social History     Socioeconomic History    Marital status: Married     Spouse name: Gavin Pound    Number of children: 2   Occupational History     Employer: NOT EMPLOYED   Tobacco Use    Smoking status: Never Smoker    Smokeless tobacco: Never Used   Substance and Sexual Activity    Alcohol use: No     Alcohol/week: 0.0 standard drinks    Drug use: No   Social History Narrative    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     Social Determinants of Health     Financial Resource Strain: Low Risk     Difficulty of Paying Living Expenses: Not very hard   Food Insecurity: No Food Insecurity    Worried About Programme researcher, broadcasting/film/video in the Last Year: Never true    Ran Out of Food in the Last Year: Never true   Transportation Needs: No Transportation Needs    Lack of Transportation (Medical): No    Lack of Transportation (Non-Medical): No       Home Medications:  No current facility-administered medications on file prior to encounter.     Current Outpatient Medications on File Prior to Encounter   Medication Sig Dispense Refill    albuterol 2.5 mg /3 mL (0.083 %) nebulizer solution Inhale 3 mL (2.5 mg total) by nebulization Two (2) times a day. 540 mL 3    ascorbic acid, vitamin C, (VITAMIN  C) 1000 MG tablet Take 1,000 mg by mouth daily.      atorvastatin (LIPITOR) 10 MG tablet Take 10 mg by mouth daily.      cholecalciferol, vitamin D3-50 mcg, 2,000 unit,, 50 mcg (2,000 unit) cap Take 2,000 Units by mouth daily.      coenzyme Q10 300 mg cap Take 10-300 mg by mouth daily.      ferrous sulfate 325 (65 FE) MG tablet Take 325 mg by mouth Two (2) times a day. BID      HYDROcodone-acetaminophen (NORCO) 7.5-325 mg per tablet Take 1 tablet by mouth three (3) times a day (at 6am, noon and 6pm).      montelukast (SINGULAIR) 10 mg tablet Take 1 tablet (10 mg total) by mouth nightly. 90 tablet 3    multivitamin-minerals-lutein Tab Take 1 tablet by mouth daily.       omeprazole (PRILOSEC) 20 MG capsule Take 20 mg by mouth Two (2) times a day.       sodium chloride 10 % Nebu Inhale 5 mL by nebulization Two (2) times a day. 900 mL 3    SYMBICORT 160-4.5 mcg/actuation inhaler INHALE 2 PUFFS INTO THE LUNGS 2 TIMES A DAY. 30.6 g 1    tiotropium bromide (SPIRIVA RESPIMAT) 2.5 mcg/actuation inhalation mist Inhale 2 puffs daily. 12 g 3    tobramycin, PF, ([name]) 300 mg/5 mL nebulizer solution Inhale 5 mL (300 mg total) by nebulization every twelve (12) hours. 28 days on and 28 days off. 280 mL 5    zinc gluconate 50 mg (7 mg elemental zinc) tablet Take 50 mg by mouth.      albuterol HFA 90 mcg/actuation inhaler Inhale 2 puffs Four (4) times a day. (Patient taking differently: Inhale 2 puffs four (4) times a day as needed.) 25.5 g 3    alcohol swabs (ALCOHOL PREP PADS) PadM Use as directed with inhaled antibiotics 100 each 5    azithromycin (ZITHROMAX) 250 MG tablet Take 1 tablet (250 mg total) by mouth daily. 90 tablet 3    calcium carbonate (CALCIUM 600 ORAL) Take 1 tablet by mouth daily.      colistimethate (COLYMYCIN) 150 mg injection Inject 2 mL sterile water for injection to mix colistin vial, then draw up 2 mL (150mg ) and inhale 2 times a day, 28 days on and 28 days off. 60 each 5    dornase alfa (PULMOZYME) 1 mg/mL nebulizer solution Inhale 1 ampule (2.5 mg) daily. Use at least 30-60 minutes before airway clearance, or after airway clearance 225 mL 3    empty container Misc USE AS DIRECTED 1 each 2    nebulizers (LC PLUS) Misc use with Colistin 1 each 5    nebulizers (LC PLUS) Misc Use as directed with hypertonic saline 10% 3 each 3    nebulizers Misc Use as directed with tobramycin 1 each 5    needle, disp, 21 G (BD REGULAR BEVEL NEEDLES) 21 gauge x 1 1/2 Ndle Use as directed with inhaled Colistin 100 each 3    sodium chloride (BD POSIFLUSH NORMAL SALINE 0.9) 0.9 % injection Inject 2mL of 0.9%NaCl into colistin vial & gently mix. After withdrawing colistin dose, add an additional 1mL of 0.9%NaCl to neb cup with the colistin dose. 180 mL 11    sterile water Soln Use 2mL to mix Colistin,then add additional 2mL to neb cup with 2mL of mixed colistin.Inhale twice daily 28 days on and 28 days off. 600 mL 5  syringe with needle (BD LUER-LOK SYRINGE) 3 mL 20 gauge x 1 1/2 Syrg For use with inhaled antibiotic (Colistin) 60 each 30    syringe with needle (BD LUER-LOK SYRINGE) 3 mL 20 gauge x 1 Syrg For use with inhaled antibiotic (Colistin) 60 each 5       In-Hospital Medications:  Scheduled:   albuterol  2.5 mg Nebulization 4x Daily (RT)    ascorbic acid (vitamin C)  1,000 mg Oral Daily    azithromycin  250 mg Oral Q24H SCH    cefTAZidime  2 g Intravenous Q8H SCH    cholecalciferol (vitamin D3 25 mcg (1,000 units))  50 mcg Oral Daily    ciprofloxacin HCl  750 mg Oral Q12H SCH    dornase alfa  2.5 mg Inhalation Daily    enoxaparin (LOVENOX) injection  40 mg Subcutaneous Q24H    ferrous sulfate  325 mg Oral BID    fluticasone furoate-vilanteroL  1 puff Inhalation Daily (RT)    montelukast  10 mg Oral Nightly    pantoprazole  20 mg Oral Daily    sodium chloride 7%  4 mL Nebulization QID tobramycin (PF)  300 mg Nebulization Q12H    umeclidinium  1 puff Inhalation Daily (RT)     Continuous Infusions:    PRN Medications:  albuterol, bismuth subsalicylate, HYDROcodone-acetaminophen, HYDROcodone-homatropine, prochlorperazine, prochlorperazine    Allergies:  Allergies as of 11/17/2020 - Reviewed 11/17/2020   Allergen Reaction Noted    Gentamicin Other (See Comments) 07/28/2013    Tobramycin Other (See Comments) 11/18/2012    Morphine Nausea And Vomiting 07/05/2012         PHYSICAL EXAM:   BP 126/79  - Pulse 106  - Temp 37.1 ??C (98.8 ??F) (Oral)  - Resp 18  - Ht 174 cm (5' 8.5)  - Wt 80.1 kg (176 lb 9.4 oz)  - SpO2 90%  - BMI 26.46 kg/m??   General: Alert, well-appearing, and in no distress.  Eyes: Anicteric sclera, conjunctiva clear.  ENT:  Nares normal, septum midline, mucosa normal, no drainage or sinus tenderness.  Lymph: No cervical or supraclavicular adenopathy.  Lungs: Reduced excursion. Reduced air entry at the right base, crackles at the left base. Overall good air entry.  Cardiovascular: Regular rate and rhythm, S1, S2 normal, no murmur, click, rub or gallop appreciated.      LABORATORY and RADIOLOGY DATA:     Pertinent Laboratory Data:  Lab Results   Component Value Date    WBC 7.7 11/18/2020    HGB 13.5 11/18/2020    HCT 39.7 11/18/2020    PLT 143 (L) 11/18/2020       Lab Results   Component Value Date    NA 136 11/18/2020    K 3.4 11/18/2020    CL 104 11/18/2020    CO2 23.0 11/18/2020    BUN 29 (H) 11/18/2020    CREATININE 1.05 11/18/2020    GLU 97 11/18/2020    CALCIUM 8.6 (L) 11/18/2020    MG 1.6 11/18/2020    PHOS 3.7 01/22/2016       Lab Results   Component Value Date    BILITOT 0.5 11/17/2020    BILIDIR 0.40 04/02/2015    PROT 7.8 11/17/2020    ALBUMIN 4.2 11/17/2020    ALT 19 11/17/2020    AST 19 11/17/2020    ALKPHOS 105 11/17/2020    GGT 64 06/21/2013       Lab Results   Component Value Date  LABPROT 10.4 08/03/2013    INR 0.95 04/14/2017    APTT 32.6 07/25/2014       Pertinent Micro Data:  Microbiology Results (last day)       Procedure Component Value Date/Time Date/Time    AFB SMEAR [[id number]] Collected: 11/17/20 1931    Lab Status: Final result Specimen: SPUTUM EXPECTORATED Updated: 11/18/20 1502     AFB Smear NO ACID FAST BACILLI SEEN- 3 negative smears do not exclude pulmonary TB. If active pulmonary TB is suspected, continue airborne isolation until pulmonary disease is excluded by negative cultures.    Lower Respiratory Culture [[id number]] Collected: 11/17/20 1433    Lab Status: Preliminary result Specimen: Sputum from Lung, Combined Updated: 11/18/20 1035     Lower Respiratory Culture Culture in Progress     Gram Stain 10-25 PMNS/LPF      <10  Epithelial cells/LPF      1+ Smear Results Suggest Mixed oral flora      Acceptable for culture    Narrative:      Specimen Source: Lung, Combined    Respiratory Pathogen Panel with COVID-19 [[id number]]  (Abnormal) Collected: 11/17/20 1932    Lab Status: Final result Specimen: Nasopharyngeal Swab Updated: 11/17/20 2059     Adenovirus Not Detected     Coronavirus HKU1 Not Detected     Coronavirus NL63 Not Detected     Coronavirus 229E Not Detected     Coronavirus OC43 PCR Not Detected     Metapneumovirus Not Detected     Rhinovirus/Enterovirus Detected     Influenza A Not Detected     Influenza B Not Detected     Parainfluenza 1 Not Detected     Parainfluenza 2 Not Detected     Parainfluenza 3 Not Detected     Parainfluenza 4 Not Detected     RSV Not Detected     Bordetella pertussis Not Detected     Comment: If B. pertussis/parapertussis infection is suspected, the Bordetella pertussis/parapertussis Qualitative PCR test should be ordered.        Bordetella parapertussis Not Detected     Chlamydophila (Chlamydia) pneumoniae Not Detected     Mycoplasma pneumoniae Not Detected     SARS-CoV-2 PCR Not Detected    Narrative:      This result was obtained using the FDA-cleared [employer] Respiratory 2.1 Panel. Performance characteristics have been established and verified by the Clinical Molecular Microbiology Laboratory, [facility]. This assay does not distinguish between rhinovirus and enterovirus. Lower respiratory specimens will not be tested for Bordetella pertussis/parapertussis. For nasopharyngeal swabs, cross-reactivity may occur between B. pertussis and non-pertussis Bordetella species. All positive B. pertussis results will be automatically confirmed using our in-house PCR assay. Ct (cycle threshold) values for SARS-CoV-2 are not available for this test.    Blood Culture #2 [[id number]] Collected: 11/17/20 1949    Lab Status: In process Specimen: Blood from 1 Peripheral Draw Updated: 11/17/20 1958    Blood Culture #1 [[id number]] Collected: 11/17/20 1931    Lab Status: In process Specimen: Blood from 1 Peripheral Draw Updated: 11/17/20 1939    AFB culture [[id number]] Collected: 11/17/20 1931    Lab Status: In process Specimen: SPUTUM EXPECTORATED Updated: 11/17/20 1937             Pertinent Imaging Data:  Reviewed in [facility]

## 2020-11-19 NOTE — Unmapped (Signed)
A&OX4. Still endorsing productive cough. States its thick and brown in color. PRN cough medicine administered with some effect. C/O nausea and loss of appetite. Multiple PRN's given this shift, see flowsheet. O2 increased to 3L per N/C due to O2 dropping to 86% on 2L from persistent coughing. Continues on contact and droplet precautions. Plan of care reviewed with patient, questions answered.

## 2020-11-19 NOTE — Unmapped (Signed)
Problem: Adult Inpatient Plan of Care  Goal: Plan of Care Review  Outcome: Ongoing - Unchanged  Goal: Patient-Specific Goal (Individualized)  Outcome: Ongoing - Unchanged  Goal: Absence of Hospital-Acquired Illness or Injury  Outcome: Ongoing - Unchanged  Intervention: Identify and Manage Fall Risk  Recent Flowsheet Documentation  Taken 11/19/2020 0800 by Newt Lukes, RN  Safety Interventions:   environmental modification   fall reduction program maintained   lighting adjusted for tasks/safety   low bed   nonskid shoes/slippers when out of bed  Intervention: Prevent Infection  Recent Flowsheet Documentation  Taken 11/19/2020 0800 by Newt Lukes, RN  Infection Prevention: environmental surveillance performed  Goal: Optimal Comfort and Wellbeing  Outcome: Ongoing - Unchanged  Goal: Readiness for Transition of Care  Outcome: Ongoing - Unchanged  Goal: Rounds/Family Conference  Outcome: Ongoing - Unchanged     Problem: Infection  Goal: Absence of Infection Signs and Symptoms  Outcome: Ongoing - Unchanged  Intervention: Prevent or Manage Infection  Recent Flowsheet Documentation  Taken 11/19/2020 0800 by Newt Lukes, RN  Infection Management: aseptic technique maintained     Problem: Pain Chronic (Persistent)  Goal: Acceptable Pain Control and Functional Ability  Outcome: Ongoing - Unchanged

## 2020-11-19 NOTE — Unmapped (Signed)
Problem: Adult Inpatient Plan of Care  Goal: Plan of Care Review  Outcome: Progressing  Goal: Patient-Specific Goal (Individualized)  Outcome: Progressing  Goal: Absence of Hospital-Acquired Illness or Injury  Outcome: Progressing  Intervention: Identify and Manage Fall Risk  Recent Flowsheet Documentation  Taken 11/18/2020 0800 by Palma Holter Tehillah Cipriani, RN  Safety Interventions:   low bed   isolation precautions   infection management   commode/urinal/bedpan at bedside   nonskid shoes/slippers when out of bed  Intervention: Prevent and Manage VTE (Venous Thromboembolism) Risk  Recent Flowsheet Documentation  Taken 11/18/2020 0800 by Palma Holter Davit Vassar, RN  Activity Management: activity adjusted per tolerance  Intervention: Prevent Infection  Recent Flowsheet Documentation  Taken 11/18/2020 0800 by Palma Holter Dorthula Bier, RN  Infection Prevention:   environmental surveillance performed   hand hygiene promoted   rest/sleep promoted   single patient room provided  Goal: Optimal Comfort and Wellbeing  Outcome: Progressing  Goal: Readiness for Transition of Care  Outcome: Progressing  Goal: Rounds/Family Conference  Outcome: Progressing

## 2020-11-19 NOTE — Unmapped (Signed)
[facility]  Emergency Department Provider Note    ED Clinical Impression     Final diagnoses:   None       Initial Impression, ED Course, Assessment and Plan     Impression: This is a 65 yo with a history of bronchiectasis who presents with 2 days of worsening cough. + sick contacts. On 2L [place] O2 at baseline. Mild SOB.    Plan for RPP, sputum cultures, afb culture/smear, cultures, cxr, and labs.     Breathing tx for symptomatic relief. Plan for admission.      Additional Medical Decision Making     I have reviewed the vital signs and the nursing notes. Labs and radiology results that were available during my care of the patient were independently reviewed by me and considered in my medical decision making.     I independently visualized the EKG tracing.   I independently visualized the radiology images.   I reviewed the patient's prior medical records.   I discussed the case with the admitting provider.     Portions of this record have been created using Scientist, clinical (histocompatibility and immunogenetics). Dictation errors have been sought, but may not have been identified and corrected.  ____________________________________________       History     Chief Complaint  Cough      HPI   Jordan [name] is a 65 y.o. male with a history of bronchiectasis who presents with 2 days of worsening cough. + sick contacts. On 2L [place] O2 at baseline. Mild SOB. No N/V/D. No CP. Last exacerbation was last summer.       Past Medical History:   Diagnosis Date   ??? Abscess of lung (CMS-HCC) 11/06/2010    CT Chest 10/22/10 12/05/2010 right thoracotomy resection of right middle lobe and resection of right lower lobe abscess    ??? Arthritis     back   ??? Biceps tendon tear 2013    left side   ??? Bronchiectasis (CMS-HCC)     chronic psuedomonas infection   ??? Degenerative joint disease of left acromioclavicular joint    ??? GERD (gastroesophageal reflux disease)    ??? Hypertension    ??? Obstructive sleep apnea on CPAP 03/14/13    AHI 33.5, on BiPAP 12/8 based on sleep study 09/2013   ??? Pneumonia 2012    Lung abcess    ??? PONV (postoperative nausea and vomiting)    ??? Prostate cancer (CMS-HCC) 09/09/2017   ??? Rotator cuff injury left   ??? Vertigo        Patient Active Problem List   Diagnosis   ??? Bronchiectasis (CMS-HCC)   ??? Esophagitis   ??? Pseudomonas aeruginosa infection   ??? Daytime somnolence   ??? Memory loss   ??? Hypersomnia with sleep apnea   ??? Essential hypertension   ??? Osteoarthrosis   ??? Dizziness   ??? Excessive daytime sleepiness   ??? Sleep disturbances   ??? Loculated pleural effusion   ??? Moderate Pharyngeal dysphagia   ??? IDA (iron deficiency anemia)   ??? Disequilibrium   ??? Gastroesophageal reflux disease without esophagitis   ??? Hoarseness   ??? Dyspnea on exertion   ??? Chronic respiratory failure with hypoxia (CMS-HCC)       Past Surgical History:   Procedure Laterality Date   ??? APPENDECTOMY  1969   ??? BACK SURGERY     ??? BRONCHOSCOPY  10/19/2010    [facility]   ??? CARPAL TUNNEL RELEASE  1999   ??? LUNG REMOVAL, PARTIAL Right 10/2010    RML and RLL partially resected   ??? MENISCECTOMY  2011   ??? NECK SURGERY  1993, 1999, 2002   ??? PR GERD TST W/ MUCOS IMPEDE ELECTROD,>1HR N/A 07/06/2012    Procedure: ESOPHAGEAL FUNCTION TEST, GASTROESOPHAGEAL REFLUX TEST W/ NASAL CATHETER INTRALUMINAL IMPEDANCE ELECTRODE(S) PLACEMENT, RECORDING, ANALYSIS AND INTERPRETATION; PROLONGED;  Surgeon: None None;  [facility];  Service: Gastroenterology   ??? PR OPEN TREAT RIB FRACTURE W/INT FIXATION, UNILATERAL, 1-2 RIBS Right 03/19/2015    Procedure: OPEN TREATMENT OF RIB FRACTURE REQUIRING INTERNAL FIXATION, UNILATERAL; 1-2 RIBS;  Surgeon: Evert Kohl, MD;  Location: MAIN OR [facility];  Service: Cardiothoracic   ??? PR THORACOTOMY W/THERAP WEDGE RESEXN ADDL IPSILATRL Right 08/07/2013    Procedure: THORACOTOMY; WITH THERAPEUTIC LOBECTOMY OF RIGHT MIDDLE AND RIGHT LOWER LOBE RESECTION , EACH ADDITIONAL RESECTION, IPSILATERAL;  Surgeon: Alvester Chou, MD;  Location: MAIN OR [place]; Service: Cardiothoracic   ??? TONSILLECTOMY  1965         Current Facility-Administered Medications:   ???  albuterol (PROVENTIL HFA;VENTOLIN HFA) 90 mcg/actuation inhaler 2 puff, 2 puff, Inhalation, Q4H PRN, Stark Bray, MD  ???  albuterol 2.5 mg /3 mL (0.083 %) nebulizer solution 2.5 mg, 2.5 mg, Nebulization, 4x Daily (RT), Stark Bray, MD, 2.5 mg at 11/19/20 0831  ???  ampicillin-sulbactam (UNASYN) 3 g in sodium chloride 0.9 % (NS) 100 mL IVPB connector bag, 3 g, Intravenous, Q6H SCH, Camie Patience, MD, Stopped at 11/19/20 0945  ???  ascorbic acid (vitamin C) (VITAMIN C) tablet 1,000 mg, 1,000 mg, Oral, Daily, Nat Math, MD, 1,000 mg at 11/19/20 0750  ???  azithromycin [facility]) tablet 250 mg, 250 mg, Oral, Q24H SCH, Nat Math, MD, 250 mg at 11/19/20 1308  ???  bismuth subsalicylate (PEPTO-BISMOL) oral suspension, 30 mL, Oral, Q6H PRN, Stark Bray, MD, 30 mL at 11/18/20 1929  ???  cholecalciferol (vitamin D3 25 mcg (1,000 units)) tablet 50 mcg, 50 mcg, Oral, Daily, Nat Math, MD, 50 mcg at 11/19/20 0751  ???  dornase alfa (PULMOZYME) 1 mg/mL solution 2.5 mg, 2.5 mg, Inhalation, Daily, Truett Mainland, MD, 2.5 mg at 11/19/20 6578  ???  enoxaparin (LOVENOX) syringe 40 mg, 40 mg, Subcutaneous, Q24H, Nat Math, MD, 40 mg at 11/18/20 2200  ???  ferrous sulfate tablet 325 mg, 325 mg, Oral, BID, Nat Math, MD, 325 mg at 11/19/20 0750  ???  fluticasone furoate-vilanteroL (BREO ELLIPTA) 200-25 mcg/dose inhaler 1 puff, 1 puff, Inhalation, Daily (RT), Truett Mainland, MD, 1 puff at 11/19/20 0848  ???  HYDROcodone-acetaminophen (NORCO) 5-325 mg per tablet 1.5 tablet, 1.5 tablet, Oral, Q8H PRN, Nat Math, MD, 1.5 tablet at 11/19/20 0755  ???  HYDROcodone-homatropine (HYCODAN) 5-1.5 mg/5 mL (5 mL) syrup 5 mL, 5 mL, Oral, Q4H PRN, Stark Bray, MD, 5 mL at 11/19/20 0323  ???  montelukast (SINGULAIR) tablet 10 mg, 10 mg, Oral, Nightly, Nat Math, MD, 10 mg at 11/18/20 2200  ???  pantoprazole (PROTONIX) EC tablet 20 mg, 20 mg, Oral, Daily, Nat Math, MD, 20 mg at 11/19/20 0750  ???  prochlorperazine (COMPAZINE) injection 5 mg, 5 mg, Intravenous, Q6H PRN, Fredricka Bonine, MD, 5 mg at 11/18/20 2050  ???  prochlorperazine (COMPAZINE) tablet 10 mg, 10 mg, Oral, Q6H PRN, Fredricka Bonine, MD, 10 mg at 11/19/20 4696  ???  sodium chloride 7% nebulizer solution 4 mL, 4 mL, Nebulization, QID, Truett Mainland,  MD, 4 mL at 11/19/20 1610  ???  tobramycin (PF) ([name]) 300 mg/5 mL nebulizer solution 300 mg, 300 mg, Nebulization, Q12H, Fredricka Bonine, MD, 300 mg at 11/19/20 0831  ???  traZODone (DESYREL) tablet 50 mg, 50 mg, Oral, Nightly PRN, Nat Math, MD, 50 mg at 11/18/20 2238  ???  umeclidinium (INCRUSE ELLIPTA) 62.5 mcg/actuation inhaler 1 puff, 1 puff, Inhalation, Daily (RT), Truett Mainland, MD, 1 puff at 11/19/20 0848    Allergies  Gentamicin, Tobramycin, and Morphine    Family History   Problem Relation Age of Onset   ??? Alzheimer's disease Mother    ??? Stroke Mother    ??? Bronchiectasis  Brother         presumed   ??? Bronchiectasis  Brother         presumed   ??? Liver disease Father    ??? Diabetes Father    ??? Heart failure Father    ??? Bronchiectasis  Daughter    ??? Asthma Son         childhood asthma   ??? Clotting disorder Neg Hx    ??? Anesthesia problems Neg Hx        Social History  Social History     Tobacco Use   ??? Smoking status: Never Smoker   ??? Smokeless tobacco: Never Used   Substance Use Topics   ??? Alcohol use: No     Alcohol/week: 0.0 standard drinks   ??? Drug use: No       Review of Systems   Constitutional: Negative for fever.  Eyes: Negative for visual changes.  ENT: Negative for sore throat.  Cardiovascular: Negative for chest pain.  Respiratory: + for shortness of breath.  Gastrointestinal: Negative for abdominal pain, vomiting or diarrhea.  Genitourinary: Negative for dysuria.   Musculoskeletal: Negative for back pain.  Skin: Negative for rash.  Neurological: Negative for headaches, focal weakness or numbness.    Physical Exam     ED Triage Vitals   Enc Vitals Group      BP 11/17/20 1322 115/83      Heart Rate 11/17/20 1322 108      SpO2 Pulse --       Resp 11/17/20 1322 22      Temp 11/17/20 1322 36.7 ??C (98.1 ??F)      Temp Source 11/17/20 1643 Oral      SpO2 11/17/20 1322 95 %      Weight 11/18/20 0236 80.1 kg (176 lb 9.4 oz)      Height 11/18/20 0236 1.74 m (5' 8.5)      Head Circumference --       Peak Flow --       Pain Score --       Pain Loc --       Pain Edu? --       Excl. in GC? --        Constitutional: Alert and oriented. Well appearing and in no distress.  Eyes: Conjunctivae are normal.  ENT       Head: Normocephalic and atraumatic.       Nose: No congestion.       Mouth/Throat: Mucous membranes are moist.       Neck: No stridor.  Hematological/Lymphatic/Immunilogical: No masses  Cardiovascular: Normal rate, regular rhythm.   Respiratory: Mildly increased WOB. Frequent cough.   Gastrointestinal: Soft and nontender.   Genitourinary: Deferred  Musculoskeletal: Normal range of motion in all extremities.   Neurologic:  Normal speech and language. No gross focal neurologic deficits are appreciated.  Skin: Skin is warm, dry and intact. No rash noted.  Psychiatric: Mood and affect are normal. Speech and behavior are normal.      Radiology     XR Chest Portable   Final Result      Similar right lung volume loss. Streaky left lung base opacity, likely corresponding with bronchiectasis seen on prior CT.             Penni Bombard, MD  11/19/20 [phone]

## 2020-11-19 NOTE — Unmapped (Signed)
Patient was compliant with nebulized medication. Refused airway clearance (chest vest) at the time due to feeling nauseous. Patient produced a strong and productive cough, thick brown secretions. No distress noted.   Respiratory

## 2020-11-19 NOTE — Unmapped (Signed)
Internal Medicine (MEDW) Progress Note    Assessment & Plan:   Jordan [name] is a 65 y.o. male with PMHx of bronchiectasis s/p resection, chronic respiratory failure on 2L oxygen, GERD that presented to [facility] with bronchiectasis flare.     Principal Problem:    Bronchiectasis (CMS-HCC)  Active Problems:    Chronic respiratory failure with hypoxia (CMS-HCC)  Resolved Problems:    * No resolved hospital problems. *      Bronchiectasis Flare - Pseudomonas Colonization - Rhinovirus Infection:  Follows with Dr. Garner Nash. History of bronchiectasis with known Pseudomonas colonization. Presented with increased cough, sputum, change in sputum purulence, worsening dyspnea, and fatigue, overall consistent with a bronchiectasis flare. CXR similar to prior, corresponding with bronchiectasis. Found to have Rhinovirus as well, likely exacerbating factor. Recent LRCx (8/11) redemonstrating MDR Pseudomonas. Will proceed with bronchiectasis flare treatment per his outpatient pulmonologist, treating through levofloxacin resistance. Will also support through Rhinovirus infection.  -- Continue ceftazidime 2g q8h   -- Continue ciprofloxacin 750 mg BID  -- Continue Home azithromycin  -- Continue Home inhaled tobramycin, reportedly will transition to off month soon  -- Continue Home albuterol  -- Continue Home Incruse-Ellipta   -- Continue Home dornase alfa  -- Continue Home equivalent, Breo-ellipta  -- Continue Home montelukast  -- Continue Home NS 7% nebulized  -- Aerobika BID per RT  -- EKG for QTc given prolonging medications above  -- Consulted Pulm - will need to repage  ??  Elevated Creatinine:  Creatinine elevated to 1.19 on admission, although appears his baseline is near this point. Suspect pre-renal etiology contributing to possible elevation.   -- Daily BMP  ??  GERD:  -- Home ppi    Daily Checklist:  Diet: Regular Diet  DVT PPx: Lovenox 40mg  q24h  Electrolytes: Replete Potassium to >/= 3.6 and Magnesium to >/= 1.8  Code Status: Full Code  Dispo: Home    Team Contact Information:   Primary Team: Internal Medicine (MEDW)  Primary Resident: Stark Bray, MD  Resident's Pager: [phone] (Gen MedW Intern - Cyndee Brightly)    Interval History:   No acute events overnight. Patient continues to cough and asking for medications to help stop. Discussed with patient that since he has rhinovirus, it may just need to run course. Pulm paged. Will need to repage in the morning.     ROS: Denies headache, chest pain, shortness of breath, abdominal pain, nausea, vomiting.    Objective:   Temp:  [36.8 ??C (98.2 ??F)-37.1 ??C (98.8 ??F)] 37.1 ??C (98.8 ??F)  Heart Rate:  [80-106] 106  Resp:  [16-20] 18  BP: (100-132)/(55-85) 126/79  FiO2 (%):  [28 %] 28 %  SpO2:  [90 %-96 %] 90 %    Gen: Chronically-ill appearing, fatigued, no acute distress, answers questions appropriately  Eyes: Sclera anicteric, EOMI, PERRLA,  HENT: atraumatic, normocephalic, MMM. OP w/o erythema or exudate   Neck: no cervical lymphadenopathy or thyromegaly, no JVD  Heart: RRR, S1, S2, no M/R/G, no chest wall tenderness  Lungs: CTAB, no crackles or wheezes, no use of accessory muscles, good air movement throughout, intermittent coughing with significant sputum production  Abdomen: Normoactive bowel sounds, soft, NTND, no rebound/guarding  Extremities: no clubbing, cyanosis, or edema: pulses are +2 in bilateral upper and lower extremities  Neuro: CN II-XI grossly intact. No focal deficits.  Skin:  No rashes, lesions on clothed exam  Psych: Alert, oriented, normal mood and affect.     Labs/Studies: Labs and Studies from  the last 24hrs per EMR and Reviewed

## 2020-11-19 NOTE — Unmapped (Signed)
Internal Medicine (MEDW) Progress Note    Assessment & Plan:   Jordan [name] is a 65 y.o. male with PMHx of bronchiectasis s/p resection, chronic respiratory failure on 2L oxygen, GERD that presented to [facility] with bronchiectasis flare.     Principal Problem:    Bronchiectasis (CMS-HCC)  Active Problems:    Chronic respiratory failure with hypoxia (CMS-HCC)  Resolved Problems:    * No resolved hospital problems. *      Bronchiectasis Flare - Pseudomonas Colonization - Rhinovirus Infection:  Follows with Dr. Garner Nash. History of bronchiectasis with known Pseudomonas colonization. Presented with increased cough, sputum, change in sputum purulence, worsening dyspnea, and fatigue, overall consistent with a bronchiectasis flare. CXR similar to prior, corresponding with bronchiectasis. Found to have Rhinovirus as well, likely exacerbating factor. Recent LRCx (8/11) redemonstrating MDR Pseudomonas. Consulted pulmonology and appreciate recommendations for narrowing antibiotic coverage given that current exacerbation is likely driven by rhinovirus.  EKG with QTc of 445.   -- Discontinued ceftazidime   -- Discontinued ciprofloxacin   --Start Unasyn   -- Continue Home inhalers  -- Aerobika BID per RT  ??  Elevated Creatinine:  Creatinine elevated to 1.19 on admission, although appears his baseline is near this point. Suspect pre-renal etiology contributing to possible elevation.   -- Daily BMP  ??  GERD:  -- Home ppi    Daily Checklist:  Diet: Regular Diet  DVT PPx: Lovenox 40mg  q24h  Electrolytes: Replete Potassium to >/= 3.6 and Magnesium to >/= 1.8  Code Status: Full Code  Dispo: Home    Team Contact Information:   Primary Team: Internal Medicine (MEDW)  Primary Resident: Bernita Raisin, MD  Resident's Pager: [phone] (Gen MedW Intern - Cyndee Brightly)    Interval History:   No acute events overnight. Patient continues to cough. Has not had nausea today. Will change to augmentin tomorrow if nausea remains resolved.    ROS: Denies headache, chest pain, shortness of breath, abdominal pain, nausea, vomiting.    Objective:   Temp:  [36.5 ??C (97.7 ??F)-37.1 ??C (98.8 ??F)] 36.5 ??C (97.7 ??F)  Heart Rate:  [73-106] 73  Resp:  [16-18] 18  BP: (103-133)/(52-79) 116/74  FiO2 (%):  [28 %] 28 %  SpO2:  [86 %-100 %] 92 %    Gen: Chronically-ill appearing, fatigued, no acute distress, answers questions appropriately  Eyes: Sclera anicteric, EOMI, PERRLA,  HENT: atraumatic, normocephalic, MMM. OP w/o erythema or exudate   Neck: no cervical lymphadenopathy or thyromegaly, no JVD  Heart: RRR, S1, S2, no M/R/G, no chest wall tenderness  Lungs: CTA in upper lung fields bilaterally. Crackles noted in lower left lung field. No wheezes, no use of accessory muscles, intermittent coughing with sputum production  Abdomen: Normoactive bowel sounds, soft, NTND, no rebound/guarding  Extremities: no clubbing, cyanosis, or edema: pulses are +2 in bilateral upper and lower extremities  Neuro: CN II-XI grossly intact. No focal deficits.  Skin:  No rashes, lesions on clothed exam  Psych: Alert, oriented, normal mood and affect.     Labs/Studies: Labs and Studies from the last 24hrs per EMR and Reviewed

## 2020-11-19 NOTE — Unmapped (Signed)
PHYSICAL THERAPY  Evaluation (11/19/20 1232)          Patient Name:?? Jordan [name]????????   Medical Record Number: [id number]   Date of Birth: [birth year]  Sex: Male??  ??    Treatment Diagnosis: Baseline , no needs     Activity Tolerance: Tolerated treatment well     ASSESSMENT  Problem List: Decreased endurance (pt does have endurance deficits at baseline and has good awareness of his deficits.)      Assessment : Jordan [name] is a 65 y.o. male with PMHx of bronchiectasis s/p resection, chronic respiratory failure on 2L oxygen, GERD that presented to [facility] with bronchiectasis flare. Pt is close to his baseline mobility. Pt able to ambulate for PT evlauation. Pt does have some endurance deficits but this is also his baseline. Pt has good awareness and able to perform energy conservation techniques without cueing from therapist. Pt able to demonstrate safe ambulation without AD at this time. PT did enocurage pt to perform OOB mobility while here, sitting up in chair for meals and performing exercises. Pt at this time does not require acute PT needs. No follow up needs at this time. After a review of the personal factors, comorbidities, clinical presentation, and examination of the number of affected body systems, the patient presents as a low complexity case.       Today's Interventions: PT educated pt safety with mobility, PT role and PT POC. PT educated pt sit/stand exercises 1 set of 5 reps, standing marches and seated LAQs, AP's and marches. Pt also stating that he has been through pulmonary rehab and has good awarenes sof his deficits, has pulse ox monitor at home and good awareness of deep breathing, pursed lip breathing.                            PLAN  Planned Frequency of Treatment:?? D/C Services for: D/C Services       Planned Interventions:       Post-Discharge Physical Therapy Recommendations:?? PT services not indicated     PT DME Recommendations: None??????????       Goals:   Patient and Family Goals: None stated        Prognosis:?? Good  Positive Indicators: PLOF  Barriers to Discharge: Endurance deficits     SUBJECTIVE  Patient reports: I have been moving around the room a little bit  Current Functional Status: Pt received supine, left sitting EOB with pt's wife in room, call bell in reach and all needs met. PT updated RN.     Prior Functional Status: Independent with all mobility. Walks 30 minutes at a time but sometimes requires standing rest break d/t increased WOB and SOB. Does have pulse oximeter at home wears 2L at home at night and when going out for walks in community otherwise on RA.  Equipment available at home: None      Past Medical History:   Diagnosis Date   ??? Abscess of lung (CMS-HCC) 11/06/2010    CT Chest 10/22/10 12/05/2010 right thoracotomy resection of right middle lobe and resection of right lower lobe abscess    ??? Arthritis     back   ??? Biceps tendon tear 2013    left side   ??? Bronchiectasis (CMS-HCC)     chronic psuedomonas infection   ??? Degenerative joint disease of left acromioclavicular joint    ??? GERD (gastroesophageal reflux disease)    ??? Hypertension    ???  Obstructive sleep apnea on CPAP 03/14/13    AHI 33.5, on BiPAP 12/8 based on sleep study 09/2013   ??? Pneumonia 2012    Lung abcess    ??? PONV (postoperative nausea and vomiting)    ??? Prostate cancer (CMS-HCC) 09/09/2017   ??? Rotator cuff injury left   ??? Vertigo             Social History     Tobacco Use   ??? Smoking status: Never Smoker   ??? Smokeless tobacco: Never Used   Substance Use Topics   ??? Alcohol use: No     Alcohol/week: 0.0 standard drinks       Past Surgical History:   Procedure Laterality Date   ??? APPENDECTOMY  1969   ??? BACK SURGERY     ??? BRONCHOSCOPY  10/19/2010    [facility]   ??? CARPAL TUNNEL RELEASE  1999   ??? LUNG REMOVAL, PARTIAL Right 10/2010    RML and RLL partially resected   ??? MENISCECTOMY  2011   ??? NECK SURGERY  1993, 1999, 2002   ??? PR GERD TST W/ MUCOS IMPEDE ELECTROD,>1HR N/A 07/06/2012    Procedure: ESOPHAGEAL FUNCTION TEST, GASTROESOPHAGEAL REFLUX TEST W/ NASAL CATHETER INTRALUMINAL IMPEDANCE ELECTRODE(S) PLACEMENT, RECORDING, ANALYSIS AND INTERPRETATION; PROLONGED;  Surgeon: None None;  [facility]: [facility];  Service: Gastroenterology   ??? PR OPEN TREAT RIB FRACTURE W/INT FIXATION, UNILATERAL, 1-2 RIBS Right 03/19/2015    Procedure: OPEN TREATMENT OF RIB FRACTURE REQUIRING INTERNAL FIXATION, UNILATERAL; 1-2 RIBS;  Surgeon: Evert Kohl, MD;  Location: MAIN OR [facility];  Service: Cardiothoracic   ??? PR THORACOTOMY W/THERAP WEDGE RESEXN ADDL IPSILATRL Right 08/07/2013    Procedure: THORACOTOMY; WITH THERAPEUTIC LOBECTOMY OF RIGHT MIDDLE AND RIGHT LOWER LOBE RESECTION , EACH ADDITIONAL RESECTION, IPSILATERAL;  Surgeon: Alvester Chou, MD;  Location: MAIN OR [facility];  Service: Cardiothoracic   ??? TONSILLECTOMY  1965             Family History   Problem Relation Age of Onset   ??? Alzheimer's disease Mother    ??? Stroke Mother    ??? Bronchiectasis  Brother         presumed   ??? Bronchiectasis  Brother         presumed   ??? Liver disease Father    ??? Diabetes Father    ??? Heart failure Father    ??? Bronchiectasis  Daughter    ??? Asthma Son         childhood asthma   ??? Clotting disorder Neg Hx    ??? Anesthesia problems Neg Hx         Allergies: Gentamicin, Tobramycin, and Morphine                  Objective Findings  Precautions / Restrictions  Precautions: Falls precautions  Weight Bearing Status: Non-applicable  Required Braces or Orthoses: Non-applicable     Communication Preference: Verbal          Pain Comments: Denies pain  Medical Tests / Procedures: Reviewed  Equipment / Environment: Patient not wearing mask for full session, Vascular access (PIV, TLC, Port-a-cath, PICC), Telemetry (2L [place])     At Rest: SpO2 88-92% on 2L when supine  With Activity: SpO2 88-96% ambulating  Orthostatics: no signs/symptoms        Living Situation  Living Environment: House  Lives With: Spouse  Home Living: Two level home, Stairs to enter without rails, Able  to Live on main level with bedroom/bathroom, Stairs to alternate level with rails, Tub/shower unit, Standard height toilet  Number of Stairs to Enter (outside): 2  Rail placement (inside): Bilateral rails  Number of Stairs to Alternate level (inside):  (full flight)      Cognition: WFL  Orientation: Oriented x4  Visual/Perception: Within Functional Limits     Skin Inspection: Intact where visualized     Upper Extremities  UE ROM: Right WFL, Left WFL  UE Strength: Left WFL, Right WFL    Lower Extremities  LE ROM: Right WFL, Left WFL  LE Strength: Left WFL, Right WFL     Sensation: WFL  Balance: WFL  Posture: WFL      Bed Mobility: Supine to Sit  Supine to Sit assistance level: Independent     Transfers: Sit to Stand  Sit to Stand assistance level: Standby assist, set-up cues, supervision of patient - no hands on      Gait Level of Assistance: Standby assist, set-up cues, supervision of patient - no hands on  Gait Assistive Device: None  Gait Distance Ambulated (ft): 80 ft  Gait: Pt ambulates slow cadence but d/t energy conservation technique and stating he feels fatigued d/t decreased appetite.     Stairs: NT            Endurance: Pt with SOB and slight increased WOB with ambulating but able to take standing rest break.     Physical Therapy Session Duration  PT Individual [mins]: 23 (co eval with Bonnita Nasuti, OT)  Reason for Co-treatment: Poor activity tolerance, To safely progress mobility     Medical Staff Made Aware: Rn made aware     I attest that I have reviewed the above information.  Signed: Earl Lites, PT  Filed 11/19/2020

## 2020-11-20 LAB — CBC
HEMATOCRIT: 34.5 % — ABNORMAL LOW (ref 39.0–48.0)
HEMOGLOBIN: 11.8 g/dL — ABNORMAL LOW (ref 12.9–16.5)
MEAN CORPUSCULAR HEMOGLOBIN CONC: 34.3 g/dL (ref 32.0–36.0)
MEAN CORPUSCULAR HEMOGLOBIN: 31 pg (ref 25.9–32.4)
MEAN CORPUSCULAR VOLUME: 90.4 fL (ref 77.6–95.7)
MEAN PLATELET VOLUME: 8.7 fL (ref 6.8–10.7)
PLATELET COUNT: 134 10*9/L — ABNORMAL LOW (ref 150–450)
RED BLOOD CELL COUNT: 3.81 10*12/L — ABNORMAL LOW (ref 4.26–5.60)
RED CELL DISTRIBUTION WIDTH: 12.7 % (ref 12.2–15.2)
WBC ADJUSTED: 4.1 10*9/L (ref 3.6–11.2)

## 2020-11-20 LAB — BASIC METABOLIC PANEL
ANION GAP: 7 mmol/L (ref 5–14)
BLOOD UREA NITROGEN: 18 mg/dL (ref 9–23)
BUN / CREAT RATIO: 18
CALCIUM: 8.3 mg/dL — ABNORMAL LOW (ref 8.7–10.4)
CHLORIDE: 106 mmol/L (ref 98–107)
CO2: 25 mmol/L (ref 20.0–31.0)
CREATININE: 1 mg/dL
EGFR CKD-EPI (2021) MALE: 83 mL/min/{1.73_m2} (ref >=60)
GLUCOSE RANDOM: 106 mg/dL (ref 70–179)
POTASSIUM: 3.4 mmol/L (ref 3.4–4.8)
SODIUM: 138 mmol/L (ref 135–145)

## 2020-11-20 LAB — MAGNESIUM: MAGNESIUM: 1.9 mg/dL (ref 1.6–2.6)

## 2020-11-20 MED ADMIN — traZODone (DESYREL) tablet 50 mg: 50 mg | ORAL | @ 02:00:00

## 2020-11-20 MED ADMIN — ampicillin-sulbactam (UNASYN) 3 g in sodium chloride 0.9 % (NS) 100 mL IVPB connector bag: 3 g | INTRAVENOUS | @ 10:00:00 | Stop: 2020-11-20

## 2020-11-20 MED ADMIN — umeclidinium (INCRUSE ELLIPTA) 62.5 mcg/actuation inhaler 1 puff: 1 | RESPIRATORY_TRACT | @ 13:00:00 | Stop: 2020-11-20

## 2020-11-20 MED ADMIN — ampicillin-sulbactam (UNASYN) 3 g in sodium chloride 0.9 % (NS) 100 mL IVPB connector bag: 3 g | INTRAVENOUS | @ 04:00:00 | Stop: 2020-11-24

## 2020-11-20 MED ADMIN — fluticasone furoate-vilanteroL (BREO ELLIPTA) 200-25 mcg/dose inhaler 1 puff: 1 | RESPIRATORY_TRACT | @ 13:00:00 | Stop: 2020-11-20

## 2020-11-20 MED ADMIN — ferrous sulfate tablet 325 mg: 325 mg | ORAL | @ 15:00:00 | Stop: 2020-11-20

## 2020-11-20 MED ADMIN — albuterol 2.5 mg /3 mL (0.083 %) nebulizer solution 2.5 mg: 2.5 mg | RESPIRATORY_TRACT | @ 16:00:00 | Stop: 2020-11-20

## 2020-11-20 MED ADMIN — tobramycin (PF) (TOBI) 300 mg/5 mL nebulizer solution 300 mg: 300 mg | RESPIRATORY_TRACT | @ 02:00:00 | Stop: 2020-11-21

## 2020-11-20 MED ADMIN — pantoprazole (PROTONIX) EC tablet 20 mg: 20 mg | ORAL | @ 15:00:00 | Stop: 2020-11-20

## 2020-11-20 MED ADMIN — cholecalciferol (vitamin D3 25 mcg (1,000 units)) tablet 50 mcg: 50 ug | ORAL | @ 15:00:00 | Stop: 2020-11-20

## 2020-11-20 MED ADMIN — azithromycin (ZITHROMAX) tablet 250 mg: 250 mg | ORAL | @ 15:00:00 | Stop: 2020-11-20

## 2020-11-20 MED ADMIN — albuterol 2.5 mg /3 mL (0.083 %) nebulizer solution 2.5 mg: 2.5 mg | RESPIRATORY_TRACT | @ 02:00:00

## 2020-11-20 MED ADMIN — sodium chloride 7% nebulizer solution 4 mL: 4 mL | RESPIRATORY_TRACT | @ 12:00:00 | Stop: 2020-11-20

## 2020-11-20 MED ADMIN — ascorbic acid (vitamin C) (VITAMIN C) tablet 1,000 mg: 1000 mg | ORAL | @ 15:00:00 | Stop: 2020-11-20

## 2020-11-20 MED ADMIN — HYDROcodone-acetaminophen (NORCO) 5-325 mg per tablet 1.5 tablet: 1.5 | ORAL | @ 08:00:00 | Stop: 2020-11-20

## 2020-11-20 MED ADMIN — HYDROcodone-homatropine (HYCODAN) 5-1.5 mg/5 mL (5 mL) syrup 5 mL: 5 mL | ORAL | @ 15:00:00 | Stop: 2020-11-20

## 2020-11-20 MED ADMIN — enoxaparin (LOVENOX) syringe 40 mg: 40 mg | SUBCUTANEOUS | @ 01:00:00

## 2020-11-20 MED ADMIN — HYDROcodone-homatropine (HYCODAN) 5-1.5 mg/5 mL (5 mL) syrup 5 mL: 5 mL | ORAL | @ 08:00:00 | Stop: 2020-11-20

## 2020-11-20 MED ADMIN — dornase alfa (PULMOZYME) 1 mg/mL solution 2.5 mg: 2.5 mg | RESPIRATORY_TRACT | @ 12:00:00 | Stop: 2020-11-20

## 2020-11-20 MED ADMIN — sodium chloride 7% nebulizer solution 4 mL: 4 mL | RESPIRATORY_TRACT | @ 16:00:00 | Stop: 2020-11-20

## 2020-11-20 MED ADMIN — montelukast (SINGULAIR) tablet 10 mg: 10 mg | ORAL | @ 01:00:00

## 2020-11-20 MED ADMIN — albuterol 2.5 mg /3 mL (0.083 %) nebulizer solution 2.5 mg: 2.5 mg | RESPIRATORY_TRACT | @ 12:00:00 | Stop: 2020-11-20

## 2020-11-20 MED ADMIN — amoxicillin-clavulanate (AUGMENTIN) 875-125 mg per tablet 1 tablet: 875 mg | ORAL | @ 18:00:00 | Stop: 2020-11-20

## 2020-11-20 MED ADMIN — HYDROcodone-acetaminophen (NORCO) 5-325 mg per tablet 1.5 tablet: 1.5 | ORAL | @ 15:00:00 | Stop: 2020-11-20

## 2020-11-20 MED ADMIN — tobramycin (PF) (TOBI) 300 mg/5 mL nebulizer solution 300 mg: 300 mg | RESPIRATORY_TRACT | @ 14:00:00 | Stop: 2020-11-20

## 2020-11-20 NOTE — Unmapped (Signed)
Pulmonary Consult Service  Consult Follow Up      Primary Service:   Medicine - W  Primary Service Attending:  Donnal Moat, *  Reason for Consult:   Cough/Exacerbation of bronchiectasis    ASSESSMENT and PLAN     Jordan [name] is a 65 y.o. male with a PMHx of Bronchiectasis with chronic Pseudomonas colonization, chronic hypoxemic respiratory failure who presents with exacerbation of bronchiectasis 2/2 rhinovirus infection, improving with decreased coughing spasms and resolution of post-tussive emesis.     Exacerbation of Bronchiectasis  On presentation, cough was more severe than usual exacerbation and associated with some post tussive emesis. He is very fatigued with the cough. Suggestive of viral induced exacerbation and rhinovirus/enterovirus in keeping with his symptoms. There is also the possibility that anaerobic infection could be a consideration now given his episodes of emesis and would advise treating for same. Ongoing airway clearance of paramount importance, as his nausea has improved, reasonable to resume home vest. His WBC is normal and he is apyrexic. We discussed his plans to travel to [facility] for family vacation and limit over extending himself.   Of note, tobramycin cycles are 28 days at a time, thus patient has completed this month's tobramycin. Due to start colistin on 9/1 if remaining in the hospital.     - Continue Ampicillin/Sulbactam then switch to oral Augmentin when nausea has improved.   - f/u LRCx (growing OPF with additional speciation pending)  - Continue airway clearance as follows:  1) albuterol neb 4x RT  2) HTS 10% (7% if 10% not available) 4x RT  3) vibralung (or vest) 4x RT  4) Pulmozyme daily  5) Inhaled antibiotics 2x daily - colistin to start 9/1 AM   6) Breo Ellipta and Incruse Ellipta inhaler daily  - Can continue vibralung or vest per patient preference as emesis has improved.     Mr. Pallone was seen, examined and discussed with  Dr. Judie Petit. Rolm Baptise   Thank you for involving [place] in his care. We look forward to following with you.  Please don't hesitate to page [place] with any questions or concerns at [phone] (pulmonary consult fellow).      Dusti Tetro E. Talmadge Coventry, MD  Pulmonary & Critical Care Fellow  Pager: [phone]      HISTORY:     History of Present Illness:  Jordan [name] is a 65 y.o. male with PMHx of Bronchiectasis with chronic Pseudomonas colonization, chronic hypoxemic respiratory failure.     [name] reports waking on Sunday with significant coughing. This resulted in some emesis. He describes the cough as barking. His sputum volume is stable but slightly darker in color. He has not had any hemoptysis. He felt hot at home but has not had any fevers. He has not had any chest tightness or pleuritic chest pain. He feels fatigued. Today he has some more persistent nausea. He has had a number of episodes of emesis. No hematemesis.     Interval Hx:  Feeling much improved today. Was able to eat yesterday & today without nausea. No longer having post-tussive emesis. Coughing spasms are decreased. Making plans for his vacation. No fevers, no chest pain.       Past Medical History:  Past Medical History:   Diagnosis Date    Abscess of lung (CMS-HCC) 11/06/2010    CT Chest 10/22/10 12/05/2010 right thoracotomy resection of right middle lobe and resection of right lower lobe abscess     Arthritis  back    Biceps tendon tear 2013    left side    Bronchiectasis (CMS-HCC)     chronic psuedomonas infection    Degenerative joint disease of left acromioclavicular joint     GERD (gastroesophageal reflux disease)     Hypertension     Obstructive sleep apnea on CPAP 03/14/13    AHI 33.5, on BiPAP 12/8 based on sleep study 09/2013    Pneumonia 2012    Lung abcess     PONV (postoperative nausea and vomiting)     Prostate cancer (CMS-HCC) 09/09/2017    Rotator cuff injury left    Vertigo      Past Surgical History:   Procedure Laterality Date    APPENDECTOMY  1969    BACK SURGERY BRONCHOSCOPY  10/19/2010    [facility]    CARPAL TUNNEL RELEASE  1999    LUNG REMOVAL, PARTIAL Right 10/2010    RML and RLL partially resected    MENISCECTOMY  2011    NECK SURGERY  1993, 1999, 2002    PR GERD TST W/ MUCOS IMPEDE ELECTROD,>1HR N/A 07/06/2012    Procedure: ESOPHAGEAL FUNCTION TEST, GASTROESOPHAGEAL REFLUX TEST W/ NASAL CATHETER INTRALUMINAL IMPEDANCE ELECTRODE(S) PLACEMENT, RECORDING, ANALYSIS AND INTERPRETATION; PROLONGED;  Surgeon: None None;  Location: [facility];  Service: Gastroenterology    PR OPEN TREAT RIB FRACTURE W/INT FIXATION, UNILATERAL, 1-2 RIBS Right 03/19/2015    Procedure: OPEN TREATMENT OF RIB FRACTURE REQUIRING INTERNAL FIXATION, UNILATERAL; 1-2 RIBS;  Surgeon: Evert Kohl, MD;  Location: MAIN OR [place];  Service: Cardiothoracic    PR THORACOTOMY W/THERAP WEDGE RESEXN ADDL IPSILATRL Right 08/07/2013    Procedure: THORACOTOMY; WITH THERAPEUTIC LOBECTOMY OF RIGHT MIDDLE AND RIGHT LOWER LOBE RESECTION , EACH ADDITIONAL RESECTION, IPSILATERAL;  Surgeon: Alvester Chou, MD;  Location: MAIN OR [facility];  Service: Cardiothoracic    TONSILLECTOMY  1965       Other History:  Family History   Problem Relation Age of Onset    Alzheimer's disease Mother     Stroke Mother     Bronchiectasis  Brother         presumed    Bronchiectasis  Brother         presumed    Liver disease Father     Diabetes Father     Heart failure Father     Bronchiectasis  Daughter     Asthma Son         childhood asthma    Clotting disorder Neg Hx     Anesthesia problems Neg Hx      Social History     Socioeconomic History    Marital status: Married     Spouse name: Jordan [name]    Number of children: 2   Occupational History     Employer: NOT EMPLOYED   Tobacco Use    Smoking status: Never Smoker    Smokeless tobacco: Never Used   Substance and Sexual Activity    Alcohol use: No     Alcohol/week: 0.0 standard drinks    Drug use: No   Social History Narrative    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     Social Determinants of Health     Financial Resource Strain: Low Risk     Difficulty of Paying Living Expenses: Not very hard   Food Insecurity: No Food Insecurity    Worried About Running Out of  Food in the Last Year: Never true    Ran Out of Food in the Last Year: Never true   Transportation Needs: No Transportation Needs    Lack of Transportation (Medical): No    Lack of Transportation (Non-Medical): No       Home Medications:  No current facility-administered medications on file prior to encounter.     Current Outpatient Medications on File Prior to Encounter   Medication Sig Dispense Refill    albuterol 2.5 mg /3 mL (0.083 %) nebulizer solution Inhale 3 mL (2.5 mg total) by nebulization Two (2) times a day. 540 mL 3    ascorbic acid, vitamin C, (VITAMIN C) 1000 MG tablet Take 1,000 mg by mouth daily.      atorvastatin (LIPITOR) 10 MG tablet Take 10 mg by mouth daily.      cholecalciferol, vitamin D3-50 mcg, 2,000 unit,, 50 mcg (2,000 unit) cap Take 2,000 Units by mouth daily.      coenzyme Q10 300 mg cap Take 10-300 mg by mouth daily.      ferrous sulfate 325 (65 FE) MG tablet Take 325 mg by mouth Two (2) times a day. BID      HYDROcodone-acetaminophen (NORCO) 7.5-325 mg per tablet Take 1 tablet by mouth three (3) times a day (at 6am, noon and 6pm).      montelukast (SINGULAIR) 10 mg tablet Take 1 tablet (10 mg total) by mouth nightly. 90 tablet 3    multivitamin-minerals-lutein Tab Take 1 tablet by mouth daily.       omeprazole (PRILOSEC) 20 MG capsule Take 20 mg by mouth Two (2) times a day.       sodium chloride 10 % Nebu Inhale 5 mL by nebulization Two (2) times a day. 900 mL 3    SYMBICORT 160-4.5 mcg/actuation inhaler INHALE 2 PUFFS INTO THE LUNGS 2 TIMES A DAY. 30.6 g 1    tiotropium bromide (SPIRIVA RESPIMAT) 2.5 mcg/actuation inhalation mist Inhale 2 puffs daily. 12 g 3    tobramycin, PF, ([name]) 300 mg/5 mL nebulizer solution Inhale 5 mL (300 mg total) by nebulization every twelve (12) hours. 28 days on and 28 days off. 280 mL 5    zinc gluconate 50 mg (7 mg elemental zinc) tablet Take 50 mg by mouth.      albuterol HFA 90 mcg/actuation inhaler Inhale 2 puffs Four (4) times a day. (Patient taking differently: Inhale 2 puffs four (4) times a day as needed.) 25.5 g 3    alcohol swabs (ALCOHOL PREP PADS) PadM Use as directed with inhaled antibiotics 100 each 5    azithromycin (ZITHROMAX) 250 MG tablet Take 1 tablet (250 mg total) by mouth daily. 90 tablet 3    calcium carbonate (CALCIUM 600 ORAL) Take 1 tablet by mouth daily.      colistimethate (COLYMYCIN) 150 mg injection Inject 2 mL sterile water for injection to mix colistin vial, then draw up 2 mL (150mg ) and inhale 2 times a day, 28 days on and 28 days off. 60 each 5    dornase alfa (PULMOZYME) 1 mg/mL nebulizer solution Inhale 1 ampule (2.5 mg) daily. Use at least 30-60 minutes before airway clearance, or after airway clearance 225 mL 3    empty container Misc USE AS DIRECTED 1 each 2    nebulizers (LC PLUS) Misc use with Colistin 1 each 5    nebulizers (LC PLUS) Misc Use as directed with hypertonic saline 10% 3 each 3    nebulizers  Misc Use as directed with tobramycin 1 each 5    needle, disp, 21 G (BD REGULAR BEVEL NEEDLES) 21 gauge x 1 1/2 Ndle Use as directed with inhaled Colistin 100 each 3    sodium chloride (BD POSIFLUSH NORMAL SALINE 0.9) 0.9 % injection Inject 2mL of 0.9%NaCl into colistin vial & gently mix. After withdrawing colistin dose, add an additional 1mL of 0.9%NaCl to neb cup with the colistin dose. 180 mL 11    sterile water Soln Use 2mL to mix Colistin, then add additional 2mL to neb cup with 2mL of mixed colistin. Inhale twice daily 28 days on and 28 days off. 600 mL 5    syringe with needle (BD LUER-LOK SYRINGE) 3 mL 20 gauge x 1 1/2 Syrg For use with inhaled antibiotic (Colistin) 60 each 30    syringe with needle (BD LUER-LOK SYRINGE) 3 mL 20 gauge x 1 Syrg For use with inhaled antibiotic (Colistin) 60 each 5       In-Hospital Medications:  Scheduled:   albuterol  2.5 mg Nebulization 4x Daily (RT)    ampicillin-sulbactam  3 g Intravenous Q6H SCH    ascorbic acid (vitamin C)  1,000 mg Oral Daily    azithromycin  250 mg Oral Q24H SCH    cholecalciferol (vitamin D3 25 mcg (1,000 units))  50 mcg Oral Daily    [START ON 11/21/2020] colistimetate (COLISTIN) inhalation  150 mg Inhalation Q12H SCH    dornase alfa  2.5 mg Inhalation Daily    enoxaparin (LOVENOX) injection  40 mg Subcutaneous Q24H    ferrous sulfate  325 mg Oral BID    fluticasone furoate-vilanteroL  1 puff Inhalation Daily (RT)    montelukast  10 mg Oral Nightly    pantoprazole  20 mg Oral Daily    sodium chloride 7%  4 mL Nebulization QID    tobramycin (PF)  300 mg Nebulization Q12H    umeclidinium  1 puff Inhalation Daily (RT)     Continuous Infusions:    PRN Medications:  albuterol, bismuth subsalicylate, HYDROcodone-acetaminophen, HYDROcodone-homatropine, prochlorperazine, prochlorperazine, traZODone    Allergies:  Allergies as of 11/17/2020 - Reviewed 11/17/2020   Allergen Reaction Noted    Gentamicin Other (See Comments) 07/28/2013    Tobramycin Other (See Comments) 11/18/2012    Morphine Nausea And Vomiting 07/05/2012         PHYSICAL EXAM:   BP 102/64  - Pulse 86  - Temp 36.7 ??C (98.1 ??F) (Oral)  - Resp 17  - Ht 174 cm (5' 8.5)  - Wt 80.1 kg (176 lb 9.4 oz)  - SpO2 93%  - BMI 26.46 kg/m??   General: Alert, well-appearing, and in no distress. Occ coughing  Eyes: Anicteric sclera, conjunctiva clear.  ENT:  Nares normal, septum midline, mucosa normal, no drainage or sinus tenderness.  Lymph: No cervical or supraclavicular adenopathy.  Lungs: Reduced excursion. Reduced air entry at the right base, crackles at the left base. Overall good air entry. No wheezes. Comfortable WOB  Cardiovascular: Regular rate and rhythm, S1, S2 normal, no murmur, click, rub or gallop appreciated.      LABORATORY and RADIOLOGY DATA:     Pertinent Laboratory Data:  Lab Results   Component Value Date    WBC 4.1 11/20/2020    HGB 11.8 (L) 11/20/2020    HCT 34.5 (L) 11/20/2020    PLT 134 (L) 11/20/2020       Lab Results   Component Value Date    NA  138 11/20/2020    K 3.4 11/20/2020    CL 106 11/20/2020    CO2 25.0 11/20/2020    BUN 18 11/20/2020    CREATININE 1.00 11/20/2020    GLU 106 11/20/2020    CALCIUM 8.3 (L) 11/20/2020    MG 1.9 11/20/2020    PHOS 3.7 01/22/2016       Lab Results   Component Value Date    BILITOT 0.5 11/17/2020    BILIDIR 0.40 04/02/2015    PROT 7.8 11/17/2020    ALBUMIN 4.2 11/17/2020    ALT 19 11/17/2020    AST 19 11/17/2020    ALKPHOS 105 11/17/2020    GGT 64 06/21/2013       Lab Results   Component Value Date    LABPROT 10.4 08/03/2013    INR 0.95 04/14/2017    APTT 32.6 07/25/2014       Pertinent Micro Data:  Microbiology Results (last day)       Procedure Component Value Date/Time Date/Time    AFB SMEAR [[id number]] Collected: 11/17/20 1931    Lab Status: Final result Specimen: SPUTUM EXPECTORATED Updated: 11/18/20 1502     AFB Smear NO ACID FAST BACILLI SEEN- 3 negative smears do not exclude pulmonary TB. If active pulmonary TB is suspected, continue airborne isolation until pulmonary disease is excluded by negative cultures.    Lower Respiratory Culture [[id number]] Collected: 11/17/20 1433    Lab Status: Preliminary result Specimen: Sputum from Lung, Combined Updated: 11/18/20 1035     Lower Respiratory Culture Culture in Progress     Gram Stain 10-25 PMNS/LPF      <10  Epithelial cells/LPF      1+ Smear Results Suggest Mixed oral flora      Acceptable for culture    Narrative:      Specimen Source: Lung, Combined    Respiratory Pathogen Panel with COVID-19 [[id number]]  (Abnormal) Collected: 11/17/20 1932    Lab Status: Final result Specimen: Nasopharyngeal Swab Updated: 11/17/20 2059     Adenovirus Not Detected     Coronavirus HKU1 Not Detected     Coronavirus NL63 Not Detected     Coronavirus 229E Not Detected     Coronavirus OC43 PCR Not Detected     Metapneumovirus Not Detected     Rhinovirus/Enterovirus Detected     Influenza A Not Detected     Influenza B Not Detected     Parainfluenza 1 Not Detected     Parainfluenza 2 Not Detected     Parainfluenza 3 Not Detected     Parainfluenza 4 Not Detected     RSV Not Detected     Bordetella pertussis Not Detected     Comment: If B. pertussis/parapertussis infection is suspected, the Bordetella pertussis/parapertussis Qualitative PCR test should be ordered.        Bordetella parapertussis Not Detected     Chlamydophila (Chlamydia) pneumoniae Not Detected     Mycoplasma pneumoniae Not Detected     SARS-CoV-2 PCR Not Detected    Narrative:      This result was obtained using the FDA-cleared [employer] Respiratory 2.1 Panel. Performance characteristics have been established and verified by the Clinical Molecular Microbiology Laboratory, [facility]. This assay does not distinguish between rhinovirus and enterovirus. Lower respiratory specimens will not be tested for Bordetella pertussis/parapertussis. For nasopharyngeal swabs, cross-reactivity may occur between B. pertussis and non-pertussis Bordetella species. All positive B. pertussis results will be automatically confirmed using our in-house PCR assay. Ct (cycle  threshold) values for SARS-CoV-2 are not available for this test.    Blood Culture #2 [[id number]] Collected: 11/17/20 1949    Lab Status: In process Specimen: Blood from 1 Peripheral Draw Updated: 11/17/20 1958    Blood Culture #1 [[id number]] Collected: 11/17/20 1931    Lab Status: In process Specimen: Blood from 1 Peripheral Draw Updated: 11/17/20 1939    AFB culture [[id number]] Collected: 11/17/20 1931    Lab Status: In process Specimen: SPUTUM EXPECTORATED Updated: 11/17/20 1937             Pertinent Imaging Data:  Reviewed in [facility]

## 2020-11-20 NOTE — Unmapped (Signed)
Hospital follow up appointment has been scheduled with   PCP Dr. Henrine Screws at [facility], on 11/29/20 @3 :30pm  Phone [phone] / Fax [phone]

## 2020-11-20 NOTE — Unmapped (Signed)
Patient discharged to home. Reviewed discharge instructions, medications, prescriptions, and follow up appointments with patient and family. All questions and concerns were addressed. PIVs removed. AVS and all belongings with patient.

## 2020-11-20 NOTE — Unmapped (Signed)
Alert and oriented, vitals stable. Remains on Contact/Droplet precautions. Respiratory treatments administered by RT. IV antibiotics continued, Norco x1 for relief of backpain, cough medicine for relief of cough. Continuous pulse Ox as ordered. Denies needs at this time.    Problem: Adult Inpatient Plan of Care  Goal: Plan of Care Review  Outcome: Progressing  Goal: Patient-Specific Goal (Individualized)  Outcome: Progressing  Goal: Absence of Hospital-Acquired Illness or Injury  Outcome: Progressing  Intervention: Prevent and Manage VTE (Venous Thromboembolism) Risk  Recent Flowsheet Documentation  Taken 11/19/2020 2000 by Erskine Squibb, RN  Activity Management: activity adjusted per tolerance  Goal: Optimal Comfort and Wellbeing  Outcome: Progressing  Goal: Readiness for Transition of Care  Outcome: Progressing     Problem: Infection  Goal: Absence of Infection Signs and Symptoms  Outcome: Progressing     Problem: Pain Chronic (Persistent)  Goal: Acceptable Pain Control and Functional Ability  Outcome: Ongoing - Unchanged     Problem: Gas Exchange Impaired  Goal: Optimal Gas Exchange  Outcome: Progressing

## 2020-11-21 MED ORDER — AMOXICILLIN 875 MG-POTASSIUM CLAVULANATE 125 MG TABLET
ORAL_TABLET | Freq: Two times a day (BID) | ORAL | 0 refills | 10.00000 days | Status: CP
Start: 2020-11-21 — End: 2020-12-01

## 2020-11-21 NOTE — Unmapped (Signed)
Physician Discharge Summary [facility]  1 [facility] OBSERVATION [facility]  [address]  [place] [address]  Dept: [phone]  Loc: [phone]     Identifying Information:   Jordan [name]  1955-11-18  [id number]    Primary Care Physician: Barbaraann Boys, MD     Code Status: Full Code    Admit Date: 11/17/2020    Discharge Date: 11/20/2020     Discharge To: Home    Discharge Service: [facility] - General Medicine Floor Team (MEDW)     Discharge Attending Physician: No att. providers found    Discharge Diagnoses:   Principal Problem:    Bronchiectasis (CMS-HCC) POA: Yes  Active Problems:    Pseudomonas aeruginosa infection POA: Yes    Essential hypertension POA: Yes    IDA (iron deficiency anemia) POA: Yes    Dyspnea on exertion POA: Yes    Chronic respiratory failure with hypoxia (CMS-HCC) POA: Yes    Rhinovirus POA: Unknown  Resolved Problems:    * No resolved hospital problems. *      Hospital Course:     Bronchiectasis Flare - Pseudomonas Colonization - Rhinovirus Infection:  Follows with Dr. Garner Nash. History of bronchiectasis with known Pseudomonas colonization. Presented with increased cough, sputum, change in sputum purulence, worsening dyspnea, and fatigue, overall consistent with a bronchiectasis flare. CXR similar to prior, corresponding with bronchiectasis. Found to have Rhinovirus as well, likely exacerbating factor. Recent LRCx (8/11) redemonstrating MDR Pseudomonas. Consulted pulmonology and treated bronchiectasis flare with IV then oral antibiotics. Provided supportive care through Rhinovirus infection, as well as therapies for airway clearance including pulmonary vest and virbralung.     Elevated Creatinine:  Creatinine elevated to 1.19 on admission, although appears his baseline is near this point. Suspect pre-renal etiology contributing to possible elevation. Downtrended to 1.00 on day of discharge.      Touchbase with Outpatient Provider:  Warm Handoff: Completed on 11/20/20 by Theola Sequin, MD (resident)    Procedures:  None  ______________________________________________________________________  Discharge Medications:     Your Medication List      START taking these medications    amoxicillin-clavulanate 875-125 mg per tablet  Commonly known as: AUGMENTIN  Take 1 tablet by mouth Two (2) times a day for 10 days.  Start taking on: November 21, 2020        CHANGE how you take these medications    albuterol 90 mcg/actuation inhaler  Commonly known as: PROVENTIL HFA;VENTOLIN HFA  Inhale 2 puffs Four (4) times a day.  What changed:   ?? when to take this  ?? reasons to take this     albuterol 2.5 mg /3 mL (0.083 %) nebulizer solution  Inhale 3 mL (2.5 mg total) by nebulization Two (2) times a day.  What changed: Another medication with the same name was changed. Make sure you understand how and when to take each.        CONTINUE taking these medications    alcohol swabs Padm  Commonly known as: ALCOHOL PREP PADS  Use as directed with inhaled antibiotics     ascorbic acid (vitamin C) 1000 MG tablet  Commonly known as: VITAMIN C  Take 1,000 mg by mouth daily.     atorvastatin 10 MG tablet  Commonly known as: LIPITOR  Take 10 mg by mouth daily.     azithromycin 250 MG tablet  Commonly known as: ZITHROMAX  Take 1 tablet (250 mg total) by mouth daily.     BD LUER-LOK  SYRINGE 3 mL 20 gauge x 1 1/2 Syrg  Generic drug: syringe with needle  For use with inhaled antibiotic (Colistin)     BD LUER-LOK SYRINGE 3 mL 20 gauge x 1 Syrg  Generic drug: syringe with needle  For use with inhaled antibiotic (Colistin)     BD POSIFLUSH NORMAL SALINE 0.9 0.9 % injection  Generic drug: sodium chloride  Inject 2mL of 0.9%NaCl into colistin vial & gently mix. After withdrawing colistin dose, add an additional 1mL of 0.9%NaCl to neb cup with the colistin dose.     BD REGULAR BEVEL NEEDLES 21 gauge x 1 1/2 Ndle  Generic drug: needle (disp) 21 G  Use as directed with inhaled Colistin     CALCIUM 600 ORAL  Take 1 tablet by mouth daily. cholecalciferol (vitamin D3-50 mcg (2,000 unit)) 50 mcg (2,000 unit) Cap  Take 2,000 Units by mouth daily.     coenzyme Q10 300 mg Cap  Take 10-300 mg by mouth daily.     colistimethate 150 mg injection  Commonly known as: COLYMYCIN  Inject 2 mL sterile water for injection to mix colistin vial, then draw up 2 mL (150mg ) and inhale 2 times a day, 28 days on and 28 days off.     dornase alfa 1 mg/mL nebulizer solution  Commonly known as: PULMOZYME  Inhale 1 ampule (2.5 mg) daily. Use at least 30-60 minutes before airway clearance, or after airway clearance     empty container Misc  USE AS DIRECTED     ferrous sulfate 325 (65 FE) MG tablet  Take 325 mg by mouth Two (2) times a day. BID     HYDROcodone-acetaminophen 7.5-325 mg per tablet  Commonly known as: NORCO  Take 1 tablet by mouth three (3) times a day (at 6am, noon and 6pm).     LC PLUS Misc  Generic drug: nebulizers  use with Colistin     LC PLUS Misc  Generic drug: nebulizers  Use as directed with hypertonic saline 10%     LC PLUS Misc  Generic drug: nebulizers  Use as directed with tobramycin     montelukast 10 mg tablet  Commonly known as: SINGULAIR  Take 1 tablet (10 mg total) by mouth nightly.     multivitamin-minerals-lutein Tab  Take 1 tablet by mouth daily.     omeprazole 20 MG capsule  Commonly known as: PriLOSEC  Take 20 mg by mouth Two (2) times a day.     sodium chloride 10 % Nebu  Inhale 5 mL by nebulization Two (2) times a day.     SPIRIVA RESPIMAT 2.5 mcg/actuation inhalation mist  Generic drug: tiotropium bromide  Inhale 2 puffs daily.     sterile water Soln  Use 2mL to mix Colistin, then add additional 2mL to neb cup with 2mL of mixed colistin. Inhale twice daily 28 days on and 28 days off.     SYMBICORT 160-4.5 mcg/actuation inhaler  Generic drug: budesonide-formoteroL  INHALE 2 PUFFS INTO THE LUNGS 2 TIMES A DAY.     tobramycin (PF) 300 mg/5 mL nebulizer solution  Commonly known as: [name]  Inhale 5 mL (300 mg total) by nebulization every twelve (12) hours. 28 days on and 28 days off.     zinc gluconate 50 mg (7 mg elemental zinc) tablet  Take 50 mg by mouth.            Allergies:  Gentamicin, Tobramycin, and Morphine  ______________________________________________________________________  Pending Test Results:  Pending Labs     Order Current Status    AFB culture In process    Blood Culture #1 Preliminary result    Blood Culture #2 Preliminary result    Lower Respiratory Culture Preliminary result          Most Recent Labs:  All lab results last 24 hours -   Recent Results (from the past 24 hour(s))   Basic metabolic panel    Collection Time: 11/20/20  4:03 AM   Result Value Ref Range    Sodium 138 135 - 145 mmol/L    Potassium 3.4 3.4 - 4.8 mmol/L    Chloride 106 98 - 107 mmol/L    CO2 25.0 20.0 - 31.0 mmol/L    Anion Gap 7 5 - 14 mmol/L    BUN 18 9 - 23 mg/dL    Creatinine 6.64 4.03 - 1.10 mg/dL    BUN/Creatinine Ratio 18     eGFR CKD-EPI (2021) Male 83 >=60 mL/min/1.36m2    Glucose 106 70 - 179 mg/dL    Calcium 8.3 (L) 8.7 - 10.4 mg/dL   Magnesium Level    Collection Time: 11/20/20  4:03 AM   Result Value Ref Range    Magnesium 1.9 1.6 - 2.6 mg/dL   CBC    Collection Time: 11/20/20  4:03 AM   Result Value Ref Range    WBC 4.1 3.6 - 11.2 10*9/L    RBC 3.81 (L) 4.26 - 5.60 10*12/L    HGB 11.8 (L) 12.9 - 16.5 g/dL    HCT 47.4 (L) 25.9 - 48.0 %    MCV 90.4 77.6 - 95.7 fL    MCH 31.0 25.9 - 32.4 pg    MCHC 34.3 32.0 - 36.0 g/dL    RDW 56.3 87.5 - 64.3 %    MPV 8.7 6.8 - 10.7 fL    Platelet 134 (L) 150 - 450 10*9/L       Relevant Studies/Radiology:  ECG 12 Lead    Result Date: 11/18/2020  SINUS RHYTHM WITH OCCASIONAL PREMATURE VENTRICULAR BEATS INCOMPLETE RIGHT BUNDLE BRANCH BLOCK LEFT ANTERIOR FASCICULAR BLOCK ABNORMAL ECG WHEN COMPARED WITH ECG OF 19-Apr-2017 08:32, PREMATURE VENTRICULAR BEATS ARE NOW PRESENT INCOMPLETE RIGHT BUNDLE BRANCH BLOCK IS NOW PRESENT SEPTAL INFARCT  IS NOW PRESENT Confirmed by Freeman Caldron ([id number]) on 11/18/2020 8:43:31 AM    XR Chest Portable    Result Date: 11/18/2020  EXAM: XR CHEST PORTABLE DATE: 11/17/2020 8:28 PM ACCESSION: [id number] UN DICTATED: 11/17/2020 8:37 PM INTERPRETATION LOCATION: [facility] CLINICAL INDICATION: 65 year old male with cough.  COMPARISON: CXR 01/22/2016, chest CT 11/10/2019 TECHNIQUE: Portable Chest Radiograph. FINDINGS: Right lung volume loss, right pleural thickening and right tracheal deviation, similar to prior. Streaky opacity in the left lung base. No pleural effusion or pneumothorax. Stable cardiomediastinal silhouette.     Similar right lung volume loss. Streaky left lung base opacity, likely corresponding with bronchiectasis seen on prior CT.    ______________________________________________________________________  Discharge Instructions:   Activity Instructions     Activity as tolerated                     Follow Up instructions and Outpatient Referrals     Call MD for:  difficulty breathing, headache or visual disturbances      Call MD for:  persistent nausea or vomiting      Call MD for:  severe uncontrolled pain      Call MD for:  temperature >38.5 Celsius  Discharge instructions          Appointments which have been scheduled for you    Mar 18, 2021  9:45 AM  (Arrive by 9:30 AM)  RETURN PFT 15 with PFT 1  [facility] PULMONARY SPECIALTY FUNCT [place] [facility] REGION) [address]  [place] [address]  [phone]      Mar 18, 2021 10:00 AM  (Arrive by 9:45 AM)  RETURN BRONCHIECTSIS with Truett Mainland, MD  [facility] PULMONARY SPECIALTY CL [place] [facility]) [address]  [place] [address]  [phone]      Mar 18, 2021  1:00 PM  (Arrive by 12:45 PM)  RETURN PALLIATIVE CARE with Elson Clan, MD  [facility] INTERNAL MEDICINE [place]  [facility]) [address]  [place] [address]  [phone]      Additional instructions:    Hospital follow up appointment has been scheduled with   PCP Dr. Henrine Screws at [facility], on 11/29/20 @3 :30pm  Phone [phone] / Fax [phone]         ______________________________________________________________________  Discharge Day Services:  BP 125/67  - Pulse 95  - Temp 36.9 ??C (98.4 ??F) (Oral)  - Resp 17  - Ht 174 cm (5' 8.5)  - Wt 80.1 kg (176 lb 9.4 oz)  - SpO2 92%  - BMI 26.46 kg/m??     Pt seen on the day of discharge and determined appropriate for discharge.    Condition at Discharge: fair    Length of Discharge: I spent greater than 30 mins in the discharge of this patient.

## 2020-11-22 DIAGNOSIS — G4733 Obstructive sleep apnea (adult) (pediatric): Secondary | ICD-10-CM | POA: Diagnosis not present

## 2020-11-22 DIAGNOSIS — J479 Bronchiectasis, uncomplicated: Secondary | ICD-10-CM | POA: Diagnosis not present

## 2020-11-22 DIAGNOSIS — J471 Bronchiectasis with (acute) exacerbation: Secondary | ICD-10-CM | POA: Diagnosis not present

## 2020-11-29 ENCOUNTER — Ambulatory Visit (HOSPITAL_COMMUNITY): Payer: Medicare Other

## 2020-12-01 DIAGNOSIS — G4733 Obstructive sleep apnea (adult) (pediatric): Secondary | ICD-10-CM | POA: Diagnosis not present

## 2020-12-01 DIAGNOSIS — J479 Bronchiectasis, uncomplicated: Secondary | ICD-10-CM | POA: Diagnosis not present

## 2020-12-01 DIAGNOSIS — J471 Bronchiectasis with (acute) exacerbation: Secondary | ICD-10-CM | POA: Diagnosis not present

## 2020-12-03 DIAGNOSIS — R0600 Dyspnea, unspecified: Secondary | ICD-10-CM | POA: Diagnosis not present

## 2020-12-03 DIAGNOSIS — J479 Bronchiectasis, uncomplicated: Secondary | ICD-10-CM | POA: Diagnosis not present

## 2020-12-03 DIAGNOSIS — J9610 Chronic respiratory failure, unspecified whether with hypoxia or hypercapnia: Secondary | ICD-10-CM | POA: Diagnosis not present

## 2020-12-03 DIAGNOSIS — R0602 Shortness of breath: Secondary | ICD-10-CM | POA: Diagnosis not present

## 2020-12-03 DIAGNOSIS — J471 Bronchiectasis with (acute) exacerbation: Secondary | ICD-10-CM | POA: Diagnosis not present

## 2020-12-06 ENCOUNTER — Ambulatory Visit (HOSPITAL_COMMUNITY)
Admission: RE | Admit: 2020-12-06 | Discharge: 2020-12-06 | Disposition: A | Discharge status: Home / Self Care | Payer: Medicare Other | Source: Ambulatory Visit | Attending: Cardiology | Admitting: Cardiology

## 2020-12-06 ENCOUNTER — Other Ambulatory Visit: Payer: Self-pay

## 2020-12-06 DIAGNOSIS — I4891 Unspecified atrial fibrillation: Secondary | ICD-10-CM | POA: Diagnosis not present

## 2020-12-06 DIAGNOSIS — I499 Cardiac arrhythmia, unspecified: Secondary | ICD-10-CM

## 2020-12-06 DIAGNOSIS — J479 Bronchiectasis, uncomplicated: Principal | ICD-10-CM

## 2020-12-06 MED ORDER — TOBRAMYCIN 300 MG/5 ML IN 0.225 % SODIUM CHLORIDE FOR NEBULIZATION
Freq: Two times a day (BID) | RESPIRATORY_TRACT | 5 refills | 28.00000 days | Status: CP
Start: 2020-12-06 — End: 2021-12-06
  Filled 2020-12-17: qty 280, 28d supply, fill #0

## 2020-12-06 MED ORDER — LC PLUS MISC
5 refills | 0 days
Start: 2020-12-06 — End: ?

## 2020-12-09 DIAGNOSIS — A498 Other bacterial infections of unspecified site: Principal | ICD-10-CM

## 2020-12-09 DIAGNOSIS — J479 Bronchiectasis, uncomplicated: Principal | ICD-10-CM

## 2020-12-09 MED ORDER — LC PLUS MISC
5 refills | 0.00000 days | Status: CP
Start: 2020-12-09 — End: ?

## 2020-12-11 DIAGNOSIS — R0602 Shortness of breath: Secondary | ICD-10-CM | POA: Diagnosis not present

## 2020-12-11 DIAGNOSIS — J479 Bronchiectasis, uncomplicated: Secondary | ICD-10-CM | POA: Diagnosis not present

## 2020-12-11 DIAGNOSIS — R0600 Dyspnea, unspecified: Secondary | ICD-10-CM | POA: Diagnosis not present

## 2020-12-11 DIAGNOSIS — J9610 Chronic respiratory failure, unspecified whether with hypoxia or hypercapnia: Secondary | ICD-10-CM | POA: Diagnosis not present

## 2020-12-13 NOTE — Unmapped (Signed)
[facility] Refill Coordination Note    Specialty Medication(s) to be Shipped:   CF/Pulmonary: -[name] Brand (tobramycin 300mg /1mL) inhalation solution    Other medication(s) to be shipped: sodium chloride and LC Plus     Jordan [name], DOB: [birth year]  Phone: [phone] (home)       All above HIPAA information was verified with patient.     Was a Nurse, learning disability used for this call? No    Completed refill call assessment today to schedule patient's medication shipment from the [facility] [phone]).  All relevant notes have been reviewed.     Specialty medication(s) and dose(s) confirmed: Regimen is correct and unchanged.   Changes to medications: Himmat reports no changes at this time.  Changes to insurance: No  New side effects reported not previously addressed with a pharmacist or physician: None reported  Questions for the pharmacist: No    Confirmed patient received a Conservation officer, historic buildings and a Surveyor, mining with first shipment. The patient will receive a drug information handout for each medication shipped and additional FDA Medication Guides as required.       DISEASE/MEDICATION-SPECIFIC INFORMATION        For CF patients: CF [facility] Active? No-not enrolled    SPECIALTY MEDICATION ADHERENCE     Medication Adherence    Patient reported X missed doses in the last month: 0  Specialty Medication: Tobramycin 300mg /26ml neb solu  Patient is on additional specialty medications: No  Patient is on more than two specialty medications: No  Informant: patient  Reliability of informant: reliable  Reasons for non-adherence: no problems identified  Support network for adherence: family member  Confirmed plan for next specialty medication refill: delivery by pharmacy  Refills needed for supportive medications: not needed        Were doses missed due to medication being on hold? No    Tobramycin 300mg /4ml: 0 days of medicine on hand     REFERRAL TO PHARMACIST     Referral to the pharmacist: Not needed      [facility]     Shipping address confirmed in Epic.     Delivery Scheduled: Yes, Expected medication delivery date: 12/18/2020.     Medication will be delivered via UPS to the prescription address in Epic WAM.    Lorelei Pont [facility] Specialty Technician

## 2020-12-17 MED FILL — LC PLUS MISC: 30 days supply | Qty: 1 | Fill #0

## 2020-12-17 MED FILL — SODIUM CHLORIDE 10 % FOR NEBULIZATION: RESPIRATORY_TRACT | 90 days supply | Qty: 900 | Fill #2

## 2020-12-22 DIAGNOSIS — J479 Bronchiectasis, uncomplicated: Secondary | ICD-10-CM | POA: Diagnosis not present

## 2020-12-22 DIAGNOSIS — J471 Bronchiectasis with (acute) exacerbation: Secondary | ICD-10-CM | POA: Diagnosis not present

## 2020-12-22 DIAGNOSIS — G4733 Obstructive sleep apnea (adult) (pediatric): Secondary | ICD-10-CM | POA: Diagnosis not present

## 2020-12-23 ENCOUNTER — Encounter: Payer: Self-pay | Admitting: Cardiology

## 2020-12-23 ENCOUNTER — Ambulatory Visit: Payer: Medicare Other | Admitting: Cardiology

## 2020-12-23 ENCOUNTER — Other Ambulatory Visit: Payer: Self-pay

## 2020-12-23 VITALS — BP 134/79 | HR 92 | Temp 98.3°F | Resp 17 | Ht 68.0 in | Wt 181.0 lb

## 2020-12-23 DIAGNOSIS — I471 Supraventricular tachycardia, unspecified: Secondary | ICD-10-CM | POA: Insufficient documentation

## 2020-12-23 DIAGNOSIS — I77810 Thoracic aortic ectasia: Secondary | ICD-10-CM

## 2020-12-23 DIAGNOSIS — I10 Essential (primary) hypertension: Secondary | ICD-10-CM

## 2020-12-23 LAB — ECHOCARDIOGRAM COMPLETE
Area-P 1/2: 2.87 cm2
S' Lateral: 3.3 cm

## 2020-12-23 MED ORDER — DILTIAZEM HCL ER COATED BEADS 120 MG PO CP24: 120.0000 mg | ORAL_CAPSULE | Freq: Every day | ORAL | 3 refills | Status: DC

## 2020-12-23 NOTE — Progress Notes (Signed)
Patient referred by Aura Dials, MD for tachycardia  Subjective:   [name], male    DOB: [birth year], 65 y.o.   MRN: [id number]   Chief Complaint  Patient presents with   Follow-up   echo reults     HPI  65 y.o. Caucasian male with hypertension, hyperlipidemia, OSA, bronchiectasis, now with irregular heart rhythm  Patient is doing well, denies any complaints. Discussed echocardiogram and monitor results with the patient, details below.   Initial consultation HPI 09/2020: Patient has had bronchiectasis since 2012.  He required supplemental oxygen 1 to 2 L, at night, and during exertion.  He has been working with pulmonary rehab where he walks up to a mile and a half, and does not require oxygen.  He has noticed episodes of tachycardia and possible A. fib on Koala monitor he uses during pulmonary rehab.  He has occasionally felt palpitation episodes lasting only for 5 seconds or so.  He denies any chest pain.  He does not smoke, does not drink alcohol, drinks 1 cup of coffee every day.  He does not have known OSA.  Current Outpatient Medications on File Prior to Visit  Medication Sig Dispense Refill   acidophilus (RISAQUAD) CAPS capsule Take 2 capsules by mouth daily. 60 capsule 0   albuterol (PROVENTIL HFA;VENTOLIN HFA) 108 (90 Base) MCG/ACT inhaler Inhale 2 puffs into the lungs every 4 (four) hours as needed for wheezing or shortness of breath.      albuterol (PROVENTIL) (2.5 MG/3ML) 0.083% nebulizer solution Inhale 2.5 mg into the lungs 4 (four) times daily.      alendronate (FOSAMAX) 70 MG tablet Take 70 mg by mouth once a week.     ascorbic acid (VITAMIN C) 250 MG CHEW Chew 1,000 mg by mouth daily.      atorvastatin (LIPITOR) 10 MG tablet Take 10 mg by mouth daily.     azithromycin (ZITHROMAX) 250 MG tablet Take 1 tablet (250 mg total) by mouth daily.  0   budesonide-formoterol (SYMBICORT) 160-4.5 MCG/ACT inhaler Inhale 2 puffs into the lungs 2 (two) times daily.      calcium carbonate (CALCIUM 600) 600 MG TABS tablet Take 600 mg by mouth 2 (two) times daily with a meal.     Cholecalciferol 25 MCG (1000 UT) tablet Take 1,000 Units by mouth daily.     Colistimethate Sodium POWD Take 2 mLs by nebulization 2 (two) times daily. Use only every other month. 19m injection.     dornase alpha (PULMOZYME) 1 MG/ML nebulizer solution Take 2.5 mg by nebulization daily. Use at least 30-60 minutes before airway clearance or after airway clearance     doxylamine, Sleep, (UNISOM) 25 MG tablet Take 25 mg by mouth at bedtime.     ferrous sulfate 325 (65 FE) MG tablet Take 325 mg by mouth 2 (two) times daily.     HYDROcodone-acetaminophen (NORCO) 7.5-325 MG tablet Take 1 tablet by mouth every 6 (six) hours as needed for moderate pain. (Patient taking differently: Take 1 tablet by mouth 3 (three) times daily.) 20 tablet 0   HYDROcodone-homatropine (HYCODAN) 5-1.5 MG/5ML syrup Take 5 mLs by mouth every 6 (six) hours as needed for cough. (Patient taking differently: Take 5 mLs by mouth every 4 (four) hours as needed for cough.) 240 mL 0   montelukast (SINGULAIR) 10 MG tablet Take 10 mg by mouth at bedtime.     Multiple Vitamin (MULTIVITAMIN WITH MINERALS) TABS tablet Take 1 tablet by mouth daily.  NON FORMULARY Use Acapella twice daily for airway clearance. Dx. 494.0. Mucous clearing device.     omeprazole (PRILOSEC) 20 MG capsule Take 20 mg by mouth 2 (two) times daily.      Respiratory Therapy Supplies (FLUTTER) DEVI Use several times daily as needed for congestion/thick mucus     Sodium Chloride 10 % NEBU Inhale 5 mLs into the lungs 2 (two) times daily.     tobramycin, PF, ([name]) 300 MG/5ML nebulizer solution Take 300 mg by nebulization 2 (two) times daily. Use only every other month     Water For Irrigation, Sterile (STERILE WATER FOR IRRIGATION) 4 mLs daily. Mix 68m with colistimethate powder to use for nebulization. 28 days on and 28 days off.     zinc gluconate 50 MG  tablet Take 50 mg by mouth daily.     No current facility-administered medications on file prior to visit.    Cardiovascular and other pertinent studies:  Echocardiogram 12/06/2020:  1. Left ventricular ejection fraction, by estimation, is 55 to 60%. The  left ventricle has normal function. The left ventricle has no regional  wall motion abnormalities. Left ventricular diastolic parameters were  normal.   2. Right ventricular systolic function is normal. The right ventricular  size is normal.   3. The mitral valve is normal in structure. No evidence of mitral valve  regurgitation. No evidence of mitral stenosis.   4. The aortic valve is tricuspid. Aortic valve regurgitation is trivial.   5. Aortic dilatation noted. Aneurysm of the aortic root, measuring 39 mm.  Aneurysm of the ascending aorta, measuring 40 mm.   Mobile cardiac telemetry 8 days 10/16/2020 - 10/24/2020: Dominant rhythm: Sinus. HR 58-145 bpm. Avg HR 84 bpm, in sinus rhythm 18 episodes of SVT, fastest at 187 bpm for 6 beats, longest for 15 beats at 142 bpm. <1% isolated SVE,  couplet/triplets. 1 episodes of VT,  at 162 bpm for 5 beats. <1% isolated VE,  couplet/triplets. No atrial fibrillation/atrial flutter//high grade AV block, sinus pause >3sec noted. 2 patient triggered events, correlate with sinus rhythm, artifact.  EKG 10/16/2020: Sinus rhythm 75 bpm Normal EKG   Recent labs: 07/09/2020: Glucose 85, BUN/Cr 27/1.15. EGFR 70. Na/K 140/4.9. Rest of the CMP normal H/H 13/41. MCV 92. Platelets 172 HbA1C N/A Chol 217, TG 187, HDL 43, LDL 140 TSH N/A  10/08/2019: Glucose 95, BUN/Cr 22/1.25. EGFR >60. Na/K 139/3.7.  H/H 12/37. MCV 94. Platelets 145    Review of Systems  Cardiovascular:  Positive for palpitations. Negative for chest pain, dyspnea on exertion, leg swelling and syncope.        Vitals:   12/23/20 1204  BP: 134/79  Pulse: 92  Resp: 17  Temp: 98.3 F (36.8 C)  SpO2: 98%     Body mass  index is 27.52 kg/m. Filed Weights   12/23/20 1204  Weight: 181 lb (82.1 kg)     Objective:   Physical Exam Vitals and nursing note reviewed.  Constitutional:      General: He is not in acute distress. Neck:     Vascular: No JVD.  Cardiovascular:     Rate and Rhythm: Normal rate and regular rhythm.     Heart sounds: Normal heart sounds. No murmur heard. Pulmonary:     Effort: Pulmonary effort is normal.     Breath sounds: Normal breath sounds. No wheezing or rales.  Musculoskeletal:     Right lower leg: No edema.     Left lower leg: No  edema.        Assessment & Recommendations:   65 y.o. Caucasian male with hypertension, hyperlipidemia, OSA, bronchiectasis, PSVT, aortic root dilatation  PSVT: Short lasting episodes. Recommend diltiazem 120 mg daily.  Aortic root dilatation: 4.0 cm. Repeat echocardiogram in 1 year. Recommend diltiazem 120 mg for rate control  F/u in 1 year    Nigel Mormon, MD Pager: [phone] Office: [phone]

## 2020-12-23 NOTE — Addendum Note (Signed)
Encounter addended by: Nigel Mormon, MD on: 12/23/2020 8:39 AM  Actions taken: Charge Capture section accepted

## 2020-12-31 DIAGNOSIS — G4733 Obstructive sleep apnea (adult) (pediatric): Secondary | ICD-10-CM | POA: Diagnosis not present

## 2020-12-31 DIAGNOSIS — J479 Bronchiectasis, uncomplicated: Secondary | ICD-10-CM | POA: Diagnosis not present

## 2020-12-31 DIAGNOSIS — J471 Bronchiectasis with (acute) exacerbation: Secondary | ICD-10-CM | POA: Diagnosis not present

## 2021-01-15 NOTE — Unmapped (Signed)
[facility] Refill Coordination Note    Specialty Medication(s) to be Shipped:   CF/Pulmonary: -Nebulized colistin (150mg  vials) and supply kit    Other medication(s) to be shipped: neb cup  Bd luer lok syringe 3ml 20g  Bd posiflush normal saline  Bd regular bevel syringes  spiriva respimat     Jordan [name], DOB: 22-Sep-1955  Phone: [phone] (home)       All above HIPAA information was verified with patient.     Was a Nurse, learning disability used for this call? No    Completed refill call assessment today to schedule patient's medication shipment from the [facility] [phone]).  All relevant notes have been reviewed.     Specialty medication(s) and dose(s) confirmed: Regimen is correct and unchanged.   Changes to medications: Jordan [name] reports no changes at this time.  Changes to insurance: No  New side effects reported not previously addressed with a pharmacist or physician: None reported  Questions for the pharmacist: No    Confirmed patient received a Conservation officer, historic buildings and a Surveyor, mining with first shipment. The patient will receive a drug information handout for each medication shipped and additional FDA Medication Guides as required.       DISEASE/MEDICATION-SPECIFIC INFORMATION        N/A    SPECIALTY MEDICATION ADHERENCE     Medication Adherence    Specialty Medication: Colistimethate 150mg   Patient is on additional specialty medications: Yes  Additional Specialty Medications: Tobramycin 300mg /82ml neb solu   Patient Reported Additional Medication X Missed Doses in the Last Month: 0  Patient is on more than two specialty medications: No  Informant: patient  Reliability of informant: reliable  Reasons for non-adherence: no problems identified  Support network for adherence: family member  Confirmed plan for next specialty medication refill: delivery by pharmacy  Refills needed for supportive medications: not needed              Were doses missed due to medication being on hold? No    Colistimethate 150mg  inj  : 7 days of medicine on hand       REFERRAL TO PHARMACIST     Referral to the pharmacist: Not needed      [facility]     Shipping address confirmed in Epic.     Delivery Scheduled: Yes, Expected medication delivery date: 11/1.     Medication will be delivered via UPS to the prescription address in Epic WAM.    Jordan [name]   [facility] Specialty Technician

## 2021-01-20 MED FILL — BD LUER-LOK SYRINGE 3 ML 20 GAUGE X 1 1/2": 30 days supply | Qty: 60 | Fill #5

## 2021-01-20 MED FILL — SPIRIVA RESPIMAT 2.5 MCG/ACTUATION SOLUTION FOR INHALATION: RESPIRATORY_TRACT | 90 days supply | Qty: 12 | Fill #3

## 2021-01-20 MED FILL — BD POSIFLUSH NORMAL SALINE 0.9 % INJECTION SYRINGE: 30 days supply | Qty: 180 | Fill #5

## 2021-01-20 MED FILL — COLISTIN (COLISTIMETHATE SODIUM) 150 MG SOLUTION FOR INJECTION: 30 days supply | Qty: 60 | Fill #3

## 2021-01-20 MED FILL — LC PLUS MISC: 30 days supply | Qty: 1 | Fill #1

## 2021-01-20 MED FILL — BD REGULAR BEVEL NEEDLES 21 GAUGE X 1 1/2": 30 days supply | Qty: 60 | Fill #4

## 2021-01-22 DIAGNOSIS — M818 Other osteoporosis without current pathological fracture: Secondary | ICD-10-CM | POA: Diagnosis not present

## 2021-01-22 DIAGNOSIS — G4733 Obstructive sleep apnea (adult) (pediatric): Secondary | ICD-10-CM | POA: Diagnosis not present

## 2021-01-22 DIAGNOSIS — J479 Bronchiectasis, uncomplicated: Secondary | ICD-10-CM | POA: Diagnosis not present

## 2021-01-22 DIAGNOSIS — K219 Gastro-esophageal reflux disease without esophagitis: Secondary | ICD-10-CM | POA: Diagnosis not present

## 2021-01-22 DIAGNOSIS — Z9889 Other specified postprocedural states: Secondary | ICD-10-CM | POA: Diagnosis not present

## 2021-01-22 DIAGNOSIS — Z79899 Other long term (current) drug therapy: Secondary | ICD-10-CM | POA: Diagnosis not present

## 2021-01-22 DIAGNOSIS — E559 Vitamin D deficiency, unspecified: Secondary | ICD-10-CM | POA: Diagnosis not present

## 2021-01-22 DIAGNOSIS — J471 Bronchiectasis with (acute) exacerbation: Secondary | ICD-10-CM | POA: Diagnosis not present

## 2021-01-24 NOTE — Unmapped (Signed)
Left vm informing pt of Dr. Garner Nash' plan to send Rx for Cipro to pharmacy.

## 2021-01-24 NOTE — Unmapped (Signed)
Pt called RN to state that over the past couple days he has experienced an increase in chest congestion, cough, fatigue, and sputum is now dark brown in color.Denies chills/fever/loss of appetite. Pt requesting a course of Cipro be sent to pharmacy in effort to avoid hospitalization. No note, most recent sputum culture showed resistance to Cipro.  Advised pt that another sputum culture may be warranted & that I would attempt to contact Dr. Garner Nash, but that if his illness progresses to seek medical attention. Pt verbalized understanding. Routing message to Dr. Garner Nash.

## 2021-01-25 MED ORDER — CIPROFLOXACIN 750 MG TABLET
ORAL_TABLET | Freq: Two times a day (BID) | ORAL | 0 refills | 14 days | Status: CP
Start: 2021-01-25 — End: ?

## 2021-01-25 NOTE — Unmapped (Signed)
Addended by: Viona Gilmore on: 01/25/2021 12:38 PM     Modules accepted: Orders

## 2021-01-25 NOTE — Unmapped (Signed)
Sent script for Ciprofloxacin 750 mg bid x 14 days to local [facility] pharmacy.  Should continue inhaled [name] or colistin (whichever taking currently) and chronic azithromycin.

## 2021-02-05 DIAGNOSIS — J3089 Other allergic rhinitis: Principal | ICD-10-CM

## 2021-02-05 DIAGNOSIS — J479 Bronchiectasis, uncomplicated: Principal | ICD-10-CM

## 2021-02-05 MED ORDER — ALBUTEROL SULFATE 2.5 MG/3 ML (0.083 %) SOLUTION FOR NEBULIZATION
Freq: Two times a day (BID) | RESPIRATORY_TRACT | 3 refills | 88.00000 days | Status: CP
Start: 2021-02-05 — End: 2022-02-05

## 2021-02-07 DIAGNOSIS — J479 Bronchiectasis, uncomplicated: Principal | ICD-10-CM

## 2021-02-07 MED ORDER — SPIRIVA RESPIMAT 2.5 MCG/ACTUATION SOLUTION FOR INHALATION
Freq: Every day | RESPIRATORY_TRACT | 3 refills | 90.00000 days | Status: CP
Start: 2021-02-07 — End: 2022-02-07

## 2021-02-11 NOTE — Unmapped (Signed)
[facility] Refill Coordination Note    Specialty Medication(s) to be Shipped:   CF/Pulmonary: -[name] Brand (tobramycin 300mg /19mL) inhalation solution    Other medication(s) to be shipped: No additional medications requested for fill at this time          Jordan [name], DOB: 09/11/1955  Phone: [phone] (home)       All above HIPAA information was verified with patient.     Was a Nurse, learning disability used for this call? No    Completed refill call assessment today to schedule patient's medication shipment from the [facility] [phone]).  All relevant notes have been reviewed.     Specialty medication(s) and dose(s) confirmed: Regimen is correct and unchanged.   Changes to medications: Jordan [name] reports no changes at this time.  Changes to insurance: No  New side effects reported not previously addressed with a pharmacist or physician: None reported  Questions for the pharmacist: No    Confirmed patient received a Conservation officer, historic buildings and a Surveyor, mining with first shipment. The patient will receive a drug information handout for each medication shipped and additional FDA Medication Guides as required.       DISEASE/MEDICATION-SPECIFIC INFORMATION        N/A    SPECIALTY MEDICATION ADHERENCE     Medication Adherence    Patient reported X missed doses in the last month: 0  Specialty Medication: Tobramycin 300mg /10ml neb solu  Patient is on additional specialty medications: No  Additional Specialty Medications: Colistimethate 150mg   Patient is on more than two specialty medications: No  Informant: patient  Reliability of informant: reliable  Reasons for non-adherence: no problems identified  Support network for adherence: family member  Confirmed plan for next specialty medication refill: delivery by pharmacy  Refills needed for supportive medications: not needed              Were doses missed due to medication being on hold? No    tobramycin 300/2mg /ml: 0 days of medicine on hand will start back on 12/01 on colistimethate regimen        REFERRAL TO PHARMACIST     Referral to the pharmacist: Not needed      [facility]     Shipping address confirmed in Epic.     Delivery Scheduled: Yes, Expected medication delivery date: 11/30.     Medication will be delivered via UPS to the prescription address in Epic WAM.    Jordan [name]   [facility] Specialty Technician

## 2021-02-18 MED FILL — TOBRAMYCIN 300 MG/5 ML IN 0.225 % SODIUM CHLORIDE FOR NEBULIZATION: RESPIRATORY_TRACT | 56 days supply | Qty: 280 | Fill #1

## 2021-02-19 DIAGNOSIS — J479 Bronchiectasis, uncomplicated: Principal | ICD-10-CM

## 2021-02-19 DIAGNOSIS — J3089 Other allergic rhinitis: Principal | ICD-10-CM

## 2021-02-19 MED ORDER — AZITHROMYCIN 250 MG TABLET
ORAL_TABLET | 3 refills | 0 days
Start: 2021-02-19 — End: ?

## 2021-02-19 MED ORDER — MONTELUKAST 10 MG TABLET
ORAL_TABLET | 3 refills | 0.00000 days
Start: 2021-02-19 — End: ?

## 2021-02-20 DIAGNOSIS — R0989 Other specified symptoms and signs involving the circulatory and respiratory systems: Secondary | ICD-10-CM | POA: Diagnosis not present

## 2021-02-20 DIAGNOSIS — R051 Acute cough: Secondary | ICD-10-CM | POA: Diagnosis not present

## 2021-02-20 DIAGNOSIS — R059 Cough, unspecified: Secondary | ICD-10-CM | POA: Diagnosis not present

## 2021-02-20 DIAGNOSIS — R5383 Other fatigue: Secondary | ICD-10-CM | POA: Diagnosis not present

## 2021-02-20 DIAGNOSIS — Z03818 Encounter for observation for suspected exposure to other biological agents ruled out: Secondary | ICD-10-CM | POA: Diagnosis not present

## 2021-02-20 MED ORDER — MONTELUKAST 10 MG TABLET
ORAL_TABLET | 3 refills | 0 days | Status: CP
Start: 2021-02-20 — End: ?

## 2021-02-20 MED ORDER — AZITHROMYCIN 250 MG TABLET
ORAL_TABLET | ORAL | 3 refills | 0.00000 days | Status: CP
Start: 2021-02-20 — End: ?

## 2021-02-21 ENCOUNTER — Emergency Department (HOSPITAL_BASED_OUTPATIENT_CLINIC_OR_DEPARTMENT_OTHER): Payer: Medicare Other

## 2021-02-21 ENCOUNTER — Other Ambulatory Visit: Payer: Self-pay

## 2021-02-21 ENCOUNTER — Inpatient Hospital Stay (HOSPITAL_BASED_OUTPATIENT_CLINIC_OR_DEPARTMENT_OTHER)
Admission: EM | Admit: 2021-02-21 | Discharge: 2021-03-04 | DRG: 190 | Disposition: A | Discharge status: Home / Self Care | Payer: Medicare Other | Attending: Family Medicine | Admitting: Family Medicine

## 2021-02-21 ENCOUNTER — Encounter (HOSPITAL_BASED_OUTPATIENT_CLINIC_OR_DEPARTMENT_OTHER): Payer: Self-pay | Admitting: *Deleted

## 2021-02-21 DIAGNOSIS — Z833 Family history of diabetes mellitus: Secondary | ICD-10-CM

## 2021-02-21 DIAGNOSIS — D72829 Elevated white blood cell count, unspecified: Secondary | ICD-10-CM

## 2021-02-21 DIAGNOSIS — I10 Essential (primary) hypertension: Secondary | ICD-10-CM | POA: Diagnosis not present

## 2021-02-21 DIAGNOSIS — Z20822 Contact with and (suspected) exposure to covid-19: Secondary | ICD-10-CM | POA: Diagnosis not present

## 2021-02-21 DIAGNOSIS — C61 Malignant neoplasm of prostate: Secondary | ICD-10-CM | POA: Diagnosis present

## 2021-02-21 DIAGNOSIS — Z79899 Other long term (current) drug therapy: Secondary | ICD-10-CM | POA: Diagnosis not present

## 2021-02-21 DIAGNOSIS — Z2239 Carrier of other specified bacterial diseases: Secondary | ICD-10-CM

## 2021-02-21 DIAGNOSIS — R627 Adult failure to thrive: Secondary | ICD-10-CM | POA: Diagnosis not present

## 2021-02-21 DIAGNOSIS — R0602 Shortness of breath: Secondary | ICD-10-CM | POA: Diagnosis not present

## 2021-02-21 DIAGNOSIS — Z79891 Long term (current) use of opiate analgesic: Secondary | ICD-10-CM

## 2021-02-21 DIAGNOSIS — Z885 Allergy status to narcotic agent status: Secondary | ICD-10-CM

## 2021-02-21 DIAGNOSIS — Z9981 Dependence on supplemental oxygen: Secondary | ICD-10-CM | POA: Diagnosis not present

## 2021-02-21 DIAGNOSIS — Z1624 Resistance to multiple antibiotics: Secondary | ICD-10-CM | POA: Diagnosis present

## 2021-02-21 DIAGNOSIS — G8929 Other chronic pain: Secondary | ICD-10-CM | POA: Diagnosis present

## 2021-02-21 DIAGNOSIS — Z8679 Personal history of other diseases of the circulatory system: Secondary | ICD-10-CM

## 2021-02-21 DIAGNOSIS — K219 Gastro-esophageal reflux disease without esophagitis: Secondary | ICD-10-CM | POA: Diagnosis not present

## 2021-02-21 DIAGNOSIS — Z823 Family history of stroke: Secondary | ICD-10-CM | POA: Diagnosis not present

## 2021-02-21 DIAGNOSIS — Z7983 Long term (current) use of bisphosphonates: Secondary | ICD-10-CM

## 2021-02-21 DIAGNOSIS — Z881 Allergy status to other antibiotic agents status: Secondary | ICD-10-CM

## 2021-02-21 DIAGNOSIS — M1990 Unspecified osteoarthritis, unspecified site: Secondary | ICD-10-CM | POA: Diagnosis not present

## 2021-02-21 DIAGNOSIS — J479 Bronchiectasis, uncomplicated: Secondary | ICD-10-CM | POA: Diagnosis not present

## 2021-02-21 DIAGNOSIS — R042 Hemoptysis: Secondary | ICD-10-CM | POA: Diagnosis not present

## 2021-02-21 DIAGNOSIS — Z6827 Body mass index (BMI) 27.0-27.9, adult: Secondary | ICD-10-CM

## 2021-02-21 DIAGNOSIS — Z8249 Family history of ischemic heart disease and other diseases of the circulatory system: Secondary | ICD-10-CM

## 2021-02-21 DIAGNOSIS — E785 Hyperlipidemia, unspecified: Secondary | ICD-10-CM | POA: Diagnosis present

## 2021-02-21 DIAGNOSIS — J9621 Acute and chronic respiratory failure with hypoxia: Secondary | ICD-10-CM | POA: Diagnosis not present

## 2021-02-21 DIAGNOSIS — J471 Bronchiectasis with (acute) exacerbation: Secondary | ICD-10-CM | POA: Diagnosis not present

## 2021-02-21 DIAGNOSIS — Z902 Acquired absence of lung [part of]: Secondary | ICD-10-CM | POA: Diagnosis not present

## 2021-02-21 DIAGNOSIS — Z8042 Family history of malignant neoplasm of prostate: Secondary | ICD-10-CM

## 2021-02-21 DIAGNOSIS — G4733 Obstructive sleep apnea (adult) (pediatric): Secondary | ICD-10-CM | POA: Diagnosis present

## 2021-02-21 DIAGNOSIS — Z7951 Long term (current) use of inhaled steroids: Secondary | ICD-10-CM

## 2021-02-21 DIAGNOSIS — Z820 Family history of epilepsy and other diseases of the nervous system: Secondary | ICD-10-CM

## 2021-02-21 DIAGNOSIS — Z8546 Personal history of malignant neoplasm of prostate: Secondary | ICD-10-CM

## 2021-02-21 LAB — SEDIMENTATION RATE: Sed Rate: 58 mm/hr — ABNORMAL HIGH (ref 0–16)

## 2021-02-21 LAB — CBC WITH DIFFERENTIAL/PLATELET
Abs Immature Granulocytes: 0.03 10*3/uL (ref 0.00–0.07)
Basophils Absolute: 0 10*3/uL (ref 0.0–0.1)
Basophils Relative: 0 %
Eosinophils Absolute: 0 10*3/uL (ref 0.0–0.5)
Eosinophils Relative: 0 %
HCT: 45.1 % (ref 39.0–52.0)
Hemoglobin: 14.9 g/dL (ref 13.0–17.0)
Immature Granulocytes: 0 %
Lymphocytes Relative: 6 %
Lymphs Abs: 0.9 10*3/uL (ref 0.7–4.0)
MCH: 30.2 pg (ref 26.0–34.0)
MCHC: 33 g/dL (ref 30.0–36.0)
MCV: 91.5 fL (ref 80.0–100.0)
Monocytes Absolute: 1 10*3/uL (ref 0.1–1.0)
Monocytes Relative: 6 %
Neutro Abs: 13.5 10*3/uL — ABNORMAL HIGH (ref 1.7–7.7)
Neutrophils Relative %: 88 %
Platelets: 235 10*3/uL (ref 150–400)
RBC: 4.93 MIL/uL (ref 4.22–5.81)
RDW: 12.1 % (ref 11.5–15.5)
WBC: 15.4 10*3/uL — ABNORMAL HIGH (ref 4.0–10.5)
nRBC: 0 % (ref 0.0–0.2)

## 2021-02-21 LAB — RESPIRATORY PANEL BY PCR

## 2021-02-21 LAB — BASIC METABOLIC PANEL
Anion gap: 12 (ref 5–15)
BUN: 24 mg/dL — ABNORMAL HIGH (ref 8–23)
CO2: 23 mmol/L (ref 22–32)
Calcium: 10.4 mg/dL — ABNORMAL HIGH (ref 8.9–10.3)
Chloride: 103 mmol/L (ref 98–111)
Creatinine, Ser: 1.02 mg/dL (ref 0.61–1.24)
GFR, Estimated: >60 mL/min (ref >60)
Glucose, Bld: 116 mg/dL — ABNORMAL HIGH (ref 70–99)
Potassium: 4.2 mmol/L (ref 3.5–5.1)
Sodium: 138 mmol/L (ref 135–145)

## 2021-02-21 LAB — C-REACTIVE PROTEIN: CRP: 1.4 mg/dL — ABNORMAL HIGH (ref <1.0)

## 2021-02-21 LAB — TROPONIN I (HIGH SENSITIVITY): Troponin I (High Sensitivity): 3 ng/L (ref <18)

## 2021-02-21 MED ORDER — ENOXAPARIN SODIUM 40 MG/0.4ML IJ SOSY
40.0000 mg | PREFILLED_SYRINGE | INTRAMUSCULAR | Status: DC
  Administered 2021-02-21 – 2021-02-24 (×4): 40 mg via SUBCUTANEOUS
  Filled 2021-02-21 (×4): qty 0.4

## 2021-02-21 MED ORDER — SODIUM CHLORIDE 0.9 % IV SOLN
2.0000 g | Freq: Three times a day (TID) | INTRAVENOUS | Status: DC
  Administered 2021-02-21: 2 g via INTRAVENOUS
  Filled 2021-02-21 (×2): qty 2

## 2021-02-21 MED ORDER — SODIUM CHLORIDE 3 % IN NEBU
10.0000 mL | INHALATION_SOLUTION | Freq: Four times a day (QID) | RESPIRATORY_TRACT | Status: DC
  Filled 2021-02-21 (×2): qty 12

## 2021-02-21 MED ORDER — SODIUM CHLORIDE 0.9 % IV SOLN
2.0000 g | Freq: Three times a day (TID) | INTRAVENOUS | Status: DC
  Filled 2021-02-21 (×2): qty 2

## 2021-02-21 MED ORDER — SODIUM CHLORIDE 0.9 % IV SOLN
1.0000 g | Freq: Three times a day (TID) | INTRAVENOUS | Status: DC
  Administered 2021-02-21 – 2021-02-25 (×11): 1 g via INTRAVENOUS
  Filled 2021-02-21 (×14): qty 1

## 2021-02-21 MED ORDER — IPRATROPIUM-ALBUTEROL 0.5-2.5 (3) MG/3ML IN SOLN: 3.0000 mL | Freq: Four times a day (QID) | RESPIRATORY_TRACT | Status: DC | PRN

## 2021-02-21 MED ORDER — TOBRAMYCIN 300 MG/5ML IN NEBU
300.0000 mg | INHALATION_SOLUTION | Freq: Two times a day (BID) | RESPIRATORY_TRACT | Status: DC
  Administered 2021-02-21 – 2021-03-04 (×22): 300 mg via RESPIRATORY_TRACT
  Filled 2021-02-21 (×24): qty 5

## 2021-02-21 MED ORDER — ALBUTEROL SULFATE (2.5 MG/3ML) 0.083% IN NEBU
2.5000 mg | INHALATION_SOLUTION | Freq: Two times a day (BID) | RESPIRATORY_TRACT | Status: DC
  Administered 2021-02-21: 2.5 mg via RESPIRATORY_TRACT
  Filled 2021-02-21: qty 3

## 2021-02-21 MED ORDER — TOBRAMYCIN 300 MG/5ML IN NEBU
60.0000 mg | INHALATION_SOLUTION | Freq: Two times a day (BID) | RESPIRATORY_TRACT | Status: DC
  Filled 2021-02-21 (×2): qty 5

## 2021-02-21 MED ORDER — SODIUM CHLORIDE 3 % IN NEBU
4.0000 mL | INHALATION_SOLUTION | Freq: Four times a day (QID) | RESPIRATORY_TRACT | Status: DC
  Administered 2021-02-21 – 2021-02-22 (×4): 4 mL via RESPIRATORY_TRACT
  Filled 2021-02-21 (×6): qty 4

## 2021-02-21 NOTE — ED Notes (Signed)
Sign block broken in room, pt verbally agrees and understands he will receive a medical screening.

## 2021-02-21 NOTE — Treatment Plan (Signed)
Discussed with Dr. Armandina Gemma 65 yo with hx bronchiectasis with known pseudomonas colonization (follows with Dr. Olena Heckle at [facility]), chronic nocturnal oxygen requirement (2 L), HTN, osteoporosis, OSA, and multiple other medical issues presenting with increasing SOB, cough.  He was prescribed course of cipro and initially improved, but worsened once this was complete.  Due to progressive SOB, he's presenting to the ED.  Negative covid, flu, RSV yesterday at PCP (can't see these in our chart, will repeat covid/flu).  Case was discussed with Dr. Olena Heckle at [facility] who recommended admission with IV abx (ceftaz, tobra nebs, and hypertonic saline while admitted).  Dr. Olena Heckle requested [facility] to [facility] as no beds at [facility] for admission at this time, she's provided her number for reference [phone]).  Labs notable for leukocytosis, mild hypercalcemia.  Pending RVP, pending blood cultures, pending covid/influenza.  CXR with volume loss and partial opacification of right hemithorax, thought 2/2 surgical changes and chronic post infectious scarring.  Vitals notable for 2 L O2 requirement.  Will admit to obs/tele at [facility].  Will prob benefit from pulm consultation while here, though can also contact his pulmonologist as above.

## 2021-02-21 NOTE — ED Notes (Signed)
Patient arrives w/ CC of shob. Patient has a history of RML and RLL removal in 2015 due to an infection. He has a prior history of bronchiectasis. Patient is using O2 all the time due to feeling shob. Patient O2 sats are 95-96% on 2 L. Patient is shob at rest and on exertion. Endorses a productive cough. Per patient tested negative for COVID and the flu yesterday. BBS mostly clear w/ some fine crackles noted, but no other adventitious breath sounds noted. RT will continue to monitor patient and patient needs throughout ED encounter.

## 2021-02-21 NOTE — ED Triage Notes (Signed)
Pt tested for Covid yesterday, sent home with steroids, pt went back today and was tole to come here due to [facility]. Night sweats last night. Fine crackles noted. RLL and RML removed 2005.

## 2021-02-21 NOTE — ED Provider Notes (Signed)
[facility] EMERGENCY DEPT Provider Note   CSN: [id number] Arrival date & time: 02/21/21  1223     History Chief Complaint  Patient presents with   Shortness of Breath    [name] is a 65 y.o. male.  Presented to the emergency room with concern for increasing shortness of breath, cough.  Patient has been having issues over the past month.  Initially discussed this with his pulmonologist at [facility] and they prescribed a course of Cipro.  States that symptoms improved while taking ciprofloxacin but then got worse once the Cipro was stopped.  Over the last week or so has noted increased difficulty in breathing.  Normally only wears oxygen at night but lately has been having to use his oxygen all the time.  Went to primary care and was negative for COVID, flu, RSV.  Cough has had increased production, nonbloody.  Later in visit patient states that he noted slight tinge of blood in cough.  Patient has extensive history of chronic lung disease, bronchiectasis, followed closely by [facility] pulmonology.  HPI     Past Medical History:  Diagnosis Date   Bronchiectasis ([place])    Bronchiolitis    Dizziness and giddiness 07/26/2012   none recent   DJD (degenerative joint disease)    back and neck   Dyspnea    with walking long distances   Gastroesophageal reflux disease    History of kidney stones yrs ago   passed on own   HTN (hypertension)    OSA (obstructive sleep apnea)    none since 30 lb weight loss   Pneumonia last time 2017   Pneumonia with lung abscess (HCC)    PONV (postoperative nausea and vomiting)    likes scopolamine patch, trouble turning neck to left   Prostate cancer ([place]) dx Aug 12, 2017   Tear of left rotator cuff    healed on own    Patient Active Problem List   Diagnosis Date Noted   Aortic root dilatation ([place]) 12/23/2020   PSVT (paroxysmal supraventricular tachycardia) ([place]) 12/23/2020   Irregular heart beat 10/16/2020   Tachycardia 10/15/2020    Bronchiectasis with (acute) exacerbation ([place]) 10/06/2019   Renal insufficiency 10/06/2019   Bronchiectasis with acute exacerbation ([place]) 10/06/2019   Prostate cancer ([place]) 12/08/2017   Perirectal abscess 10/14/2017   Sepsis ([place]) 10/14/2017   Malignant neoplasm of prostate ([place]) 09/09/2017   Right-sided chest wall pain 06/02/2013   Hypersomnia with sleep apnea, unspecified 02/20/2013   Pneumonia due to Pseudomonas ([place]) 02/20/2013   Dizziness and giddiness 07/26/2012   Memory deficit 07/26/2012   Pneumonia with lung abscess (HCC)    DJD (degenerative joint disease)    Primary hypertension    Lung abscess ([place]) 11/06/2010   Bronchiectasis ([place]) 11/06/2010    Past Surgical History:  Procedure Laterality Date   APPENDECTOMY  1969   BACK SURGERY     multiple, DJD, cervical fusion, plate removed   CARPAL TUNNEL RELEASE Right 1999   COLONOSCOPY  12/1980   bleeding from penis and anus after beach trip; colonoscopy and removed urinary stricture at same time   KNEE ARTHROSCOPY     left   LUNG SURGERY  2012,    removed right middle lobe,due to birth defect, removed half of right bottom lobe due to abcess   LUNG SURGERY  07/2013   removed bottom right middle and lower lobe   LYMPHADENECTOMY Bilateral 12/08/2017   Procedure: LYMPHADENECTOMY;  Surgeon: Alexis Frock, MD;  Location:  [facility];  Service: Urology;  Laterality: Bilateral;   NECK SURGERY  [id number], 2002   OTHER SURGICAL HISTORY  2016   removed 4" of lower right rib   OTHER SURGICAL HISTORY     has had 8 PICC lines placed over the course of several years.   Right video-assisted thoracoscopic surgery,  12/05/2010   ROBOT ASSISTED LAPAROSCOPIC RADICAL PROSTATECTOMY N/A 12/08/2017   Procedure: XI ROBOTIC ASSISTED LAPAROSCOPIC RADICAL PROSTATECTOMY;  Surgeon: Alexis Frock, MD;  Location: [facility];  Service: Urology;  Laterality: N/A;   TONSILLECTOMY  1965   VIDEO BRONCHOSCOPY     Dr Arlyce Dice       Family History  Problem  Relation Age of Onset   Stroke Mother    Alzheimer's disease Mother    Transient ischemic attack Mother    Chorea Mother    Liver disease Father    Diabetes Father    Congestive Heart Failure Father    Other Other        sibling with possible right middle lobe issues   Ataxia Brother    Ataxia Maternal Aunt    Prostate cancer Maternal Grandfather    Prostate cancer Cousin    Prostate cancer Cousin    Prostate cancer Cousin     Social History   Tobacco Use   Smoking status: Never   Smokeless tobacco: Never  Vaping Use   Vaping Use: Never used  Substance Use Topics   Alcohol use: No    Comment: quit in 1994   Drug use: Never    Home Medications Prior to Admission medications   Medication Sig Start Date End Date Taking? Authorizing Provider  acidophilus (RISAQUAD) CAPS capsule Take 2 capsules by mouth daily. 10/09/19   Elgergawy, Silver Huguenin, MD  albuterol (PROVENTIL HFA;VENTOLIN HFA) 108 (90 Base) MCG/ACT inhaler Inhale 2 puffs into the lungs every 4 (four) hours as needed for wheezing or shortness of breath.     [provider]  albuterol (PROVENTIL) (2.5 MG/3ML) 0.083% nebulizer solution Inhale 2.5 mg into the lungs 4 (four) times daily.     [provider]  alendronate (FOSAMAX) 70 MG tablet Take 70 mg by mouth once a week. 08/01/20   [provider]  ascorbic acid (VITAMIN C) 250 MG CHEW Chew 1,000 mg by mouth daily.     [provider]  atorvastatin (LIPITOR) 10 MG tablet Take 10 mg by mouth daily. 09/16/20   [provider]  azithromycin (ZITHROMAX) 250 MG tablet Take 1 tablet (250 mg total) by mouth daily. 10/19/19   Elgergawy, Silver Huguenin, MD  budesonide-formoterol (SYMBICORT) 160-4.5 MCG/ACT inhaler Inhale 2 puffs into the lungs 2 (two) times daily.    [provider]  calcium carbonate (OS-CAL) 600 MG TABS tablet Take 600 mg by mouth 2 (two) times daily with a meal.    [provider]  Cholecalciferol 25 MCG (1000  UT) tablet Take 1,000 Units by mouth daily.    [provider]  Coenzyme Q10 300 MG CAPS Take 1 capsule by mouth daily. 04/23/20   [provider]  Colistimethate Sodium POWD Take 2 mLs by nebulization 2 (two) times daily. Use only every other month. 150mg  injection.    [provider]  diltiazem (CARDIZEM CD) 120 MG 24 hr capsule Take 1 capsule (120 mg total) by mouth daily. 12/23/20   Patwardhan, Reynold Bowen, MD  dornase alpha (PULMOZYME) 1 MG/ML nebulizer solution Take 2.5 mg by nebulization daily. Use at least 30-60  minutes before airway clearance or after airway clearance    [provider]  doxylamine, Sleep, (UNISOM) 25 MG tablet Take 25 mg by mouth at bedtime.    [provider]  ferrous sulfate 325 (65 FE) MG tablet Take 325 mg by mouth 2 (two) times daily.    [provider]  HYDROcodone-acetaminophen (NORCO) 7.5-325 MG tablet Take 1 tablet by mouth every 6 (six) hours as needed for moderate pain. Patient taking differently: Take 1 tablet by mouth 3 (three) times daily. 12/08/17   Debbrah Alar, PA-C  montelukast (SINGULAIR) 10 MG tablet Take 1 tablet by mouth daily. 12/21/19   [provider]  Multiple Vitamin (MULTIVITAMIN WITH MINERALS) TABS tablet Take 1 tablet by mouth daily.    [provider]  NON FORMULARY Use Acapella twice daily for airway clearance. Dx. 494.0. Mucous clearing device. 11/18/12   [provider]  omeprazole (PRILOSEC) 20 MG capsule Take 20 mg by mouth 2 (two) times daily.     [provider]  Respiratory Therapy Supplies (FLUTTER) DEVI Use several times daily as needed for congestion/thick mucus    [provider]  Sodium Chloride 10 % NEBU Inhale 5 mLs into the lungs 2 (two) times daily. 09/08/19   [provider]  SPIRIVA RESPIMAT 2.5 MCG/ACT AERS Inhale 1 puff into the lungs in the morning and at bedtime. 08/16/20   [provider]  tobramycin, PF, ([name])  300 MG/5ML nebulizer solution Take 300 mg by nebulization 2 (two) times daily. Use only every other month    [provider]  Water For Irrigation, Sterile (STERILE WATER FOR IRRIGATION) 4 mLs daily. Mix 69ml with colistimethate powder to use for nebulization. 28 days on and 28 days off.    [provider]  zinc gluconate 50 MG tablet Take 50 mg by mouth daily.    [provider]    Allergies    Gentamicin, Morphine, Morphine and related, and Tobramycin  Review of Systems   Review of Systems  Constitutional:  Positive for fatigue. Negative for chills and fever.  HENT:  Negative for ear pain and sore throat.   Eyes:  Negative for pain and visual disturbance.  Respiratory:  Positive for cough and shortness of breath.   Cardiovascular:  Negative for chest pain and palpitations.  Gastrointestinal:  Negative for abdominal pain and vomiting.  Genitourinary:  Negative for dysuria and hematuria.  Musculoskeletal:  Negative for arthralgias and back pain.  Skin:  Negative for color change and rash.  Neurological:  Negative for seizures and syncope.  All other systems reviewed and are negative.  Physical Exam Updated Vital Signs BP 127/73   Pulse 91   Temp 98.3 F (36.8 C) (Oral)   Resp 18   Ht 5\' 7"  (1.702 m)   Wt 79.8 kg   SpO2 95%   BMI 27.57 kg/m   Physical Exam Vitals and nursing note reviewed.  Constitutional:      General: He is not in acute distress.    Appearance: He is well-developed.  HENT:     Head: Normocephalic and atraumatic.  Eyes:     Conjunctiva/sclera: Conjunctivae normal.  Cardiovascular:     Rate and Rhythm: Normal rate and regular rhythm.     Heart sounds: No murmur heard. Pulmonary:     Effort: Pulmonary effort is normal. No respiratory distress.     Breath sounds: Normal breath sounds.  Abdominal:     Palpations: Abdomen is soft.  Tenderness: There is no abdominal tenderness.  Musculoskeletal:        General: No swelling.      Cervical back: Neck supple.  Skin:    General: Skin is warm and dry.     Capillary Refill: Capillary refill takes less than 2 seconds.  Neurological:     Mental Status: He is alert.  Psychiatric:        Mood and Affect: Mood normal.    ED Results / Procedures / Treatments   Labs (all labs ordered are listed, but only abnormal results are displayed) Labs Reviewed  CBC WITH DIFFERENTIAL/PLATELET - Abnormal; Notable for the following components:      Result Value   WBC 15.4 (*)    Neutro Abs 13.5 (*)    All other components within normal limits  BASIC METABOLIC PANEL - Abnormal; Notable for the following components:   Glucose, Bld 116 (*)    BUN 24 (*)    Calcium 10.4 (*)    All other components within normal limits  EXPECTORATED SPUTUM ASSESSMENT W GRAM STAIN, RFLX TO RESP C  RESPIRATORY PANEL BY PCR  CULTURE, BLOOD (ROUTINE X 2)  CULTURE, BLOOD (ROUTINE X 2)  C-REACTIVE PROTEIN  SEDIMENTATION RATE  TROPONIN I (HIGH SENSITIVITY)    EKG EKG Interpretation  Date/Time:  Friday February 21 2021 12:42:15 EST Ventricular Rate:  98 PR Interval:  212 QRS Duration: 104 QT Interval:  370 QTC Calculation: 473 R Axis:   -53 Text Interpretation: Sinus rhythm Borderline prolonged PR interval Probable left atrial enlargement Incomplete RBBB and LAFB Low voltage, precordial leads Confirmed by Regan Lemming ([id number]) on 02/21/2021 3:48:25 PM  Radiology DG Chest Portable 1 View  Result Date: 02/21/2021 CLINICAL DATA:  COVID-19. Shortness of breath. Right lower and middle lobe resection 7 years ago secondary to infection. EXAM: PORTABLE CHEST 1 VIEW COMPARISON:  10/05/2019 FINDINGS: Numerous leads and wires project over the chest. Similar tracheal deviation to the right secondary to volume loss in the right hemithorax. Pleuroparenchymal opacity throughout a majority of the right hemithorax is similar, consistent with the clinical history of partial pneumonectomy secondary to chronic  infection. No left-sided pleural effusion. No pneumothorax. Diffuse pulmonary interstitial thickening, without left-sided lobar consolidation. IMPRESSION: Similar appearance of volume loss and partial opacification of the right hemithorax, likely due to surgical changes and chronic post infectious scarring. No evidence of left-sided pneumonia. Electronically Signed   By: Abigail Miyamoto M.D.   On: 02/21/2021 14:06    Procedures Procedures   Medications Ordered in ED Medications  cefTAZidime (FORTAZ) 2 g in sodium chloride 0.9 % 100 mL IVPB (has no administration in time range)    ED Course  I have reviewed the triage vital signs and the nursing notes.  Pertinent labs & imaging results that were available during my care of the patient were reviewed by me and considered in my medical decision making (see chart for details).  Clinical Course as of 02/21/21 1605  Fri Feb 21, 2021  1538 ceftax 2q8; tob; HTS 10 q6; [phone] [RD]    Clinical Course User Index [RD] Lucrezia Starch, MD   MDM Rules/Calculators/A&P                           65 year old gentleman with history of bronchiectasis presents to ER with concern for worsening cough and shortness of breath.  Normally only uses oxygen at night, reports now requiring oxygen during the day.  On exam noted mild tachypnea but not in distress.  CXR without acute finding.  Lab work noted for leukocytosis.  Discussed patient's presentation with his pulmonologist, Dr. Olena Heckle at [facility].  She recommends admission, IV antibiotics at this time.  Specifically recommends ceftazidime IV and tobra nebs and hypertonic saline tx while admitted.  She requests admission to [facility] system as they do not have any beds now.  Dr. Quillian Quince happy to take any questions from admitting team and wished her cell number be in chart -  [phone].  Paged [facility] for admit.  Final Clinical Impression(s) / ED Diagnoses Final diagnoses:  Bronchiectasis with acute exacerbation  ([place])  Acute on chronic respiratory failure with hypoxia (HCC)  Leukocytosis, unspecified type    Rx / DC Orders ED Discharge Orders     None        Lucrezia Starch, MD 02/21/21 1605

## 2021-02-21 NOTE — ED Provider Notes (Signed)
  Physical Exam  BP 127/73   Pulse 91   Temp 98.3 F (36.8 C) (Oral)   Resp 18   Ht 5\' 7"  (1.702 m)   Wt 79.8 kg   SpO2 95%   BMI 27.57 kg/m     ED Course/Procedures   Clinical Course as of 02/21/21 1549  Fri Feb 21, 2021  1538 ceftax 2q8; tob; HTS 10 q6; [phone] [RD]    Clinical Course User Index [RD] Lucrezia Starch, MD    Procedures  MDM    65 year old male with a medical history significant for chronic bronchiectasis who presents to the emergency department with complaint of bronchiectasis exacerbation, shortness of breath.  Patient has a history of right middle lobe and right lower lobe removal due to infectious complications.  He has an extensive and longstanding history of bronchiectasis.  He is on O2 on 2 L chronically usually satting 95 to 96%.  He tested negative for COVID and influenza yesterday.  Presents with worsening respiratory symptoms today.  Please see previous emergency physician notes for prior MDM.  [facility] pulmonology falls and treats the patient and had recommended admission for IV ceftazidime and nebulized tobramycin.  Plan for [facility] admission following hospitalist consultation. Hospitalist medicine consulted and accepted the patient in admission.      Regan Lemming, MD 02/21/21 [phone]

## 2021-02-21 NOTE — H&P (Signed)
History and Physical    [name] KNL:[id number] DOB: December 26, 1955 DOA: 02/21/2021  PCP: Aura Dials, MD  Patient coming from: [facility]  I have personally briefly reviewed patient's old medical records in [place]  Chief Complaint: Shortness of breath  HPI: [name] is a 65 y.o. male with medical history significant for bronchiectasis with chronic Pseudomonas colonization and chronic hypoxemic respiratory failure requiring 2 L supplemental oxygen with exertion and sleep, prior history of right middle and lower lobe lung resections secondary to abscess, PSVT, aortic root dilatation, hypertension, hyperlipidemia, OSA here for worsening shortness of breath.   At baseline he wears 2L with exertion and sleep for bronchiectasis. Started to have worsening symptoms at the beginning of November. Completed Cipro with improvement but worse again after course ended. Has increase dyspnea ambulating short distances. More coughing. No fever but has night sweats. Saw PCP yesterday and had negative COVID, flu and RSV testing.   ED Course: He was afebrile, normotensive on baseline 2L via [place].  WBC of 15.4, hemoglobin of 14.9.  Sodium of 138, K of 4.2, creatinine of 1.02, BG of 116. Respiratory viral panel was negative.  ED physician discussed case with his primary pulmonologist at [facility] Dr. Quillian Quince and she recommended treatment with IV ceftazidime, tobramycin nebulizer and hypertonic saline nebulizers.  He was requested to be admitted with [facility] since there are currently no beds at [facility].  Review of Systems: Constitutional: No Weight Change, No Fever ENT/Mouth: No sore throat, No Rhinorrhea Eyes: No Eye Pain, No Vision Changes Cardiovascular: No Chest Pain, + SOB, No PND, + Dyspnea on Exertion, No Orthopnea, No Edema Respiratory: + Cough, No Sputum, No Wheezing Gastrointestinal: No Nausea, No Vomiting, No Diarrhea, No Constipation, No Pain Genitourinary: no Urinary  Incontinence Musculoskeletal: No Arthralgias, No Myalgias Skin: No Skin Lesions, No Pruritus, Neuro: no Weakness, No Numbness Psych: No Anxiety/Panic, No Depression, no decrease appetite Heme/Lymph: No Bruising, No Bleeding   Past Medical History:  Diagnosis Date   Bronchiectasis (HCC)    Bronchiolitis    Dizziness and giddiness 07/26/2012   none recent   DJD (degenerative joint disease)    back and neck   Dyspnea    with walking long distances   Gastroesophageal reflux disease    History of kidney stones yrs ago   passed on own   HTN (hypertension)    OSA (obstructive sleep apnea)    none since 30 lb weight loss   Pneumonia last time 2017   Pneumonia with lung abscess (HCC)    PONV (postoperative nausea and vomiting)    likes scopolamine patch, trouble turning neck to left   Prostate cancer ([place]) dx Aug 12, 2017   Tear of left rotator cuff    healed on own    Past Surgical History:  Procedure Laterality Date   APPENDECTOMY  1969   BACK SURGERY     multiple, DJD, cervical fusion, plate removed   CARPAL TUNNEL RELEASE Right 1999   COLONOSCOPY  12/1980   bleeding from penis and anus after beach trip; colonoscopy and removed urinary stricture at same time   KNEE ARTHROSCOPY     left   LUNG SURGERY  2012,    removed right middle lobe,due to birth defect, removed half of right bottom lobe due to abcess   LUNG SURGERY  07/2013   removed bottom right middle and lower lobe   LYMPHADENECTOMY Bilateral 12/08/2017   Procedure: LYMPHADENECTOMY;  Surgeon: Alexis Frock, MD;  Location: [facility];  Service: Urology;  Laterality: Bilateral;   NECK SURGERY  [id number], 2002   OTHER SURGICAL HISTORY  2016   removed 4" of lower right rib   OTHER SURGICAL HISTORY     has had 8 PICC lines placed over the course of several years.   Right video-assisted thoracoscopic surgery,  12/05/2010   ROBOT ASSISTED LAPAROSCOPIC RADICAL PROSTATECTOMY N/A 12/08/2017   Procedure: XI ROBOTIC ASSISTED  LAPAROSCOPIC RADICAL PROSTATECTOMY;  Surgeon: Alexis Frock, MD;  Location: [facility];  Service: Urology;  Laterality: N/A;   TONSILLECTOMY  1965   VIDEO BRONCHOSCOPY     Dr Arlyce Dice     reports that he has never smoked. He has never used smokeless tobacco. He reports that he does not drink alcohol and does not use drugs. Social History  Allergies  Allergen Reactions   Gentamicin Other (See Comments)    BALANCE ISSUES   Morphine Nausea And Vomiting   Morphine And Related Nausea And Vomiting   Tobramycin Nausea And Vomiting and Other (See Comments)    Ototoxicity, when administered in IV     Family History  Problem Relation Age of Onset   Stroke Mother    Alzheimer's disease Mother    Transient ischemic attack Mother    Chorea Mother    Liver disease Father    Diabetes Father    Congestive Heart Failure Father    Other Other        sibling with possible right middle lobe issues   Ataxia Brother    Ataxia Maternal Aunt    Prostate cancer Maternal Grandfather    Prostate cancer Cousin    Prostate cancer Cousin    Prostate cancer Cousin      Prior to Admission medications   Medication Sig Start Date End Date Taking? Authorizing Provider  acidophilus (RISAQUAD) CAPS capsule Take 2 capsules by mouth daily. 10/09/19   Elgergawy, Silver Huguenin, MD  albuterol (PROVENTIL HFA;VENTOLIN HFA) 108 (90 Base) MCG/ACT inhaler Inhale 2 puffs into the lungs every 4 (four) hours as needed for wheezing or shortness of breath.     [provider]  albuterol (PROVENTIL) (2.5 MG/3ML) 0.083% nebulizer solution Inhale 2.5 mg into the lungs 4 (four) times daily.     [provider]  alendronate (FOSAMAX) 70 MG tablet Take 70 mg by mouth once a week. 08/01/20   [provider]  ascorbic acid (VITAMIN C) 250 MG CHEW Chew 1,000 mg by mouth daily.     [provider]  atorvastatin (LIPITOR) 10 MG tablet Take 10 mg by mouth daily. 09/16/20   [provider]   azithromycin (ZITHROMAX) 250 MG tablet Take 1 tablet (250 mg total) by mouth daily. 10/19/19   Elgergawy, Silver Huguenin, MD  budesonide-formoterol (SYMBICORT) 160-4.5 MCG/ACT inhaler Inhale 2 puffs into the lungs 2 (two) times daily.    [provider]  calcium carbonate (OS-CAL) 600 MG TABS tablet Take 600 mg by mouth 2 (two) times daily with a meal.    [provider]  Cholecalciferol 25 MCG (1000 UT) tablet Take 1,000 Units by mouth daily.    [provider]  Coenzyme Q10 300 MG CAPS Take 1 capsule by mouth daily. 04/23/20   [provider]  Colistimethate Sodium POWD Take 2 mLs by nebulization 2 (two) times daily. Use only every other month. 150mg  injection.    [provider]  diltiazem (CARDIZEM CD) 120 MG 24 hr capsule Take 1 capsule (120 mg total)  by mouth daily. 12/23/20   Patwardhan, Reynold Bowen, MD  dornase alpha (PULMOZYME) 1 MG/ML nebulizer solution Take 2.5 mg by nebulization daily. Use at least 30-60 minutes before airway clearance or after airway clearance    [provider]  doxylamine, Sleep, (UNISOM) 25 MG tablet Take 25 mg by mouth at bedtime.    [provider]  ferrous sulfate 325 (65 FE) MG tablet Take 325 mg by mouth 2 (two) times daily.    [provider]  HYDROcodone-acetaminophen (NORCO) 7.5-325 MG tablet Take 1 tablet by mouth every 6 (six) hours as needed for moderate pain. Patient taking differently: Take 1 tablet by mouth 3 (three) times daily. 12/08/17   Debbrah Alar, PA-C  montelukast (SINGULAIR) 10 MG tablet Take 1 tablet by mouth daily. 12/21/19   [provider]  Multiple Vitamin (MULTIVITAMIN WITH MINERALS) TABS tablet Take 1 tablet by mouth daily.    [provider]  NON FORMULARY Use Acapella twice daily for airway clearance. Dx. 494.0. Mucous clearing device. 11/18/12   [provider]  omeprazole (PRILOSEC) 20 MG capsule Take 20 mg by mouth 2 (two) times daily.     [provider]  Respiratory Therapy Supplies (FLUTTER) DEVI Use several times daily as needed for congestion/thick mucus    [provider]  Sodium Chloride 10 % NEBU Inhale 5 mLs into the lungs 2 (two) times daily. 09/08/19   [provider]  SPIRIVA RESPIMAT 2.5 MCG/ACT AERS Inhale 1 puff into the lungs in the morning and at bedtime. 08/16/20   [provider]  tobramycin, PF, ([name]) 300 MG/5ML nebulizer solution Take 300 mg by nebulization 2 (two) times daily. Use only every other month    [provider]  Water For Irrigation, Sterile (STERILE WATER FOR IRRIGATION) 4 mLs daily. Mix 54ml with colistimethate powder to use for nebulization. 28 days on and 28 days off.    [provider]  zinc gluconate 50 MG tablet Take 50 mg by mouth daily.    [provider]    Physical Exam: Vitals:   02/21/21 1615 02/21/21 1934 02/21/21 1948 02/21/21 2050  BP: 118/78  (!) 122/93 127/78  Pulse: 79  92 89  Resp: 18  13 16   Temp:    98.3 F (36.8 C)  TempSrc:    Oral  SpO2: 97% 95% 95% 95%  Weight:      Height:        Constitutional: NAD, calm, comfortable, fatigued chronically ill-appearing elderly male sitting upright in bed on his cell phone Vitals:   02/21/21 1615 02/21/21 1934 02/21/21 1948 02/21/21 2050  BP: 118/78  (!) 122/93 127/78  Pulse: 79  92 89  Resp: 18  13 16   Temp:    98.3 F (36.8 C)  TempSrc:    Oral  SpO2: 97% 95% 95% 95%  Weight:      Height:       Eyes: lids and conjunctivae normal ENMT: Mucous membranes are moist.  Neck: normal, supple Respiratory: Good aeration throughout with bibasilar rhonchi but no wheezing or crackles or crackles.  normal respiratory effort on 2L via [place]. Labored respiration when talking although still able to sleep in full sentences. No accessory muscle use.  Cardiovascular: Regular rate and rhythm, no murmurs / rubs / gallops. No extremity edema.  Abdomen: no tenderness, no masses palpated.   Bowel sounds positive.  Musculoskeletal: no clubbing / cyanosis. No joint deformity upper and lower extremities. Normal muscle tone.  Skin: no rashes, lesions, ulcers.  Neurologic: CN 2-12 grossly intact.  Strength 5/5 in all 4.  Psychiatric: Normal judgment and insight. Alert and oriented x 3. Normal mood.    Labs on Admission: I have personally reviewed following labs and imaging studies  CBC: Recent Labs  Lab 02/21/21 1309  WBC 15.4*  NEUTROABS 13.5*  HGB 14.9  HCT 45.1  MCV 91.5  PLT 027   Basic Metabolic Panel: Recent Labs  Lab 02/21/21 1309  NA 138  K 4.2  CL 103  CO2 23  GLUCOSE 116*  BUN 24*  CREATININE 1.02  CALCIUM 10.4*   GFR: Estimated Creatinine Clearance: 72.1 mL/min (by C-G formula based on SCr of 1.02 mg/dL). Liver Function Tests: No results for input(s): AST, ALT, ALKPHOS, BILITOT, PROT, ALBUMIN in the last 168 hours. No results for input(s): LIPASE, AMYLASE in the last 168 hours. No results for input(s): AMMONIA in the last 168 hours. Coagulation Profile: No results for input(s): INR, PROTIME in the last 168 hours. Cardiac Enzymes: No results for input(s): CKTOTAL, CKMB, CKMBINDEX, TROPONINI in the last 168 hours. BNP (last 3 results) No results for input(s): PROBNP in the last 8760 hours. HbA1C: No results for input(s): HGBA1C in the last 72 hours. CBG: No results for input(s): GLUCAP in the last 168 hours. Lipid Profile: No results for input(s): CHOL, HDL, LDLCALC, TRIG, CHOLHDL, LDLDIRECT in the last 72 hours. Thyroid Function Tests: No results for input(s): TSH, T4TOTAL, FREET4, T3FREE, THYROIDAB in the last 72 hours. Anemia Panel: No results for input(s): VITAMINB12, FOLATE, FERRITIN, TIBC, IRON, RETICCTPCT in the last 72 hours. Urine analysis:    Component Value Date/Time   COLORURINE YELLOW 10/06/2019 0556   APPEARANCEUR CLEAR 10/06/2019 0556   LABSPEC 1.028 10/06/2019 0556   PHURINE 5.0 10/06/2019 0556   GLUCOSEU NEGATIVE  10/06/2019 0556   HGBUR NEGATIVE 10/06/2019 0556   BILIRUBINUR NEGATIVE 10/06/2019 0556   KETONESUR NEGATIVE 10/06/2019 0556   PROTEINUR NEGATIVE 10/06/2019 0556   UROBILINOGEN 0.2 12/03/2010 1212   NITRITE NEGATIVE 10/06/2019 0556   LEUKOCYTESUR NEGATIVE 10/06/2019 0556    Radiological Exams on Admission: DG Chest Portable 1 View  Result Date: 02/21/2021 CLINICAL DATA:  COVID-19. Shortness of breath. Right lower and middle lobe resection 7 years ago secondary to infection. EXAM: PORTABLE CHEST 1 VIEW COMPARISON:  10/05/2019 FINDINGS: Numerous leads and wires project over the chest. Similar tracheal deviation to the right secondary to volume loss in the right hemithorax. Pleuroparenchymal opacity throughout a majority of the right hemithorax is similar, consistent with the clinical history of partial pneumonectomy secondary to chronic infection. No left-sided pleural effusion. No pneumothorax. Diffuse pulmonary interstitial thickening, without left-sided lobar consolidation. IMPRESSION: Similar appearance of volume loss and partial opacification of the right hemithorax, likely due to surgical changes and chronic post infectious scarring. No evidence of left-sided pneumonia. Electronically Signed   By: Abigail Miyamoto M.D.   On: 02/21/2021 14:06      Assessment/Plan  Acute exacerbation of bronchiectasis -Has known Pseudomonas colonization and RML/RLL resection.  At baseline on 2 L use for nocturnal oxygen and exertion. -His primary pulmonologist at [facility] Dr. Olena Heckle who recommended treatment with IV ceftazidime-had sputum culture in August with susceptibility only to IV ceftazidime and IV Zosyn. -Continue tobramycin nebs, chronic saline nebulizer 10% was recommended but only 3% available here-will continue q6hr -continue home albuterol, Spiriva, Symbicort, Pulmozyme -obtain sputum culture for bacteria and AFB  -Dr. Olena Heckle at [facility] provided her number [phone]) for reference but  could benefit  from pulmonology consult here   Hx of PSVT Continue diltiazem  HLD Continue statin  Chronic pain Continue scheduled Norco   DVT prophylaxis:.Lovenox Code Status: Full Family Communication: Plan discussed with patient at bedside  disposition Plan: Home with at least 2 midnight stays  Consults called: Phone consult to [facility] primary pulmonologist Dr. Olena Heckle was made by ED physician Admission status: inpatient    Level of care: Telemetry  Status is: Observation  The patient remains OBS appropriate and will d/c before 2 midnights.        Orene Desanctis DO [facility]   If 7PM-7AM, please contact night-coverage [email or web address]   02/21/2021, 10:12 PM

## 2021-02-21 NOTE — ED Notes (Signed)
Report given to [facility] RT; Patient awaiting transport at this time to [facility].

## 2021-02-22 DIAGNOSIS — R0602 Shortness of breath: Secondary | ICD-10-CM | POA: Diagnosis not present

## 2021-02-22 DIAGNOSIS — M1990 Unspecified osteoarthritis, unspecified site: Secondary | ICD-10-CM | POA: Diagnosis present

## 2021-02-22 DIAGNOSIS — Z833 Family history of diabetes mellitus: Secondary | ICD-10-CM | POA: Diagnosis not present

## 2021-02-22 DIAGNOSIS — Z8042 Family history of malignant neoplasm of prostate: Secondary | ICD-10-CM | POA: Diagnosis not present

## 2021-02-22 DIAGNOSIS — J471 Bronchiectasis with (acute) exacerbation: Secondary | ICD-10-CM | POA: Diagnosis not present

## 2021-02-22 DIAGNOSIS — Z79891 Long term (current) use of opiate analgesic: Secondary | ICD-10-CM | POA: Diagnosis not present

## 2021-02-22 DIAGNOSIS — Z820 Family history of epilepsy and other diseases of the nervous system: Secondary | ICD-10-CM | POA: Diagnosis not present

## 2021-02-22 DIAGNOSIS — E785 Hyperlipidemia, unspecified: Secondary | ICD-10-CM | POA: Diagnosis not present

## 2021-02-22 DIAGNOSIS — G4733 Obstructive sleep apnea (adult) (pediatric): Secondary | ICD-10-CM | POA: Diagnosis present

## 2021-02-22 DIAGNOSIS — J9621 Acute and chronic respiratory failure with hypoxia: Secondary | ICD-10-CM | POA: Diagnosis not present

## 2021-02-22 DIAGNOSIS — R918 Other nonspecific abnormal finding of lung field: Secondary | ICD-10-CM | POA: Diagnosis not present

## 2021-02-22 DIAGNOSIS — R911 Solitary pulmonary nodule: Secondary | ICD-10-CM | POA: Diagnosis not present

## 2021-02-22 DIAGNOSIS — G8929 Other chronic pain: Secondary | ICD-10-CM | POA: Diagnosis present

## 2021-02-22 DIAGNOSIS — Z8679 Personal history of other diseases of the circulatory system: Secondary | ICD-10-CM | POA: Diagnosis not present

## 2021-02-22 DIAGNOSIS — K219 Gastro-esophageal reflux disease without esophagitis: Secondary | ICD-10-CM | POA: Diagnosis present

## 2021-02-22 DIAGNOSIS — J479 Bronchiectasis, uncomplicated: Secondary | ICD-10-CM | POA: Diagnosis not present

## 2021-02-22 DIAGNOSIS — Z902 Acquired absence of lung [part of]: Secondary | ICD-10-CM | POA: Diagnosis not present

## 2021-02-22 DIAGNOSIS — Z1624 Resistance to multiple antibiotics: Secondary | ICD-10-CM | POA: Diagnosis present

## 2021-02-22 DIAGNOSIS — Z823 Family history of stroke: Secondary | ICD-10-CM | POA: Diagnosis not present

## 2021-02-22 DIAGNOSIS — R042 Hemoptysis: Secondary | ICD-10-CM | POA: Diagnosis not present

## 2021-02-22 DIAGNOSIS — Z7983 Long term (current) use of bisphosphonates: Secondary | ICD-10-CM | POA: Diagnosis not present

## 2021-02-22 DIAGNOSIS — Z9981 Dependence on supplemental oxygen: Secondary | ICD-10-CM | POA: Diagnosis not present

## 2021-02-22 DIAGNOSIS — R627 Adult failure to thrive: Secondary | ICD-10-CM | POA: Diagnosis not present

## 2021-02-22 DIAGNOSIS — Z7951 Long term (current) use of inhaled steroids: Secondary | ICD-10-CM | POA: Diagnosis not present

## 2021-02-22 DIAGNOSIS — Z8249 Family history of ischemic heart disease and other diseases of the circulatory system: Secondary | ICD-10-CM | POA: Diagnosis not present

## 2021-02-22 DIAGNOSIS — I10 Essential (primary) hypertension: Secondary | ICD-10-CM | POA: Diagnosis present

## 2021-02-22 DIAGNOSIS — Z79899 Other long term (current) drug therapy: Secondary | ICD-10-CM | POA: Diagnosis not present

## 2021-02-22 DIAGNOSIS — Z8546 Personal history of malignant neoplasm of prostate: Secondary | ICD-10-CM | POA: Diagnosis not present

## 2021-02-22 DIAGNOSIS — Z20822 Contact with and (suspected) exposure to covid-19: Secondary | ICD-10-CM | POA: Diagnosis present

## 2021-02-22 LAB — BASIC METABOLIC PANEL
Anion gap: 6 (ref 5–15)
BUN: 31 mg/dL — ABNORMAL HIGH (ref 8–23)
CO2: 25 mmol/L (ref 22–32)
Calcium: 8.6 mg/dL — ABNORMAL LOW (ref 8.9–10.3)
Chloride: 105 mmol/L (ref 98–111)
Creatinine, Ser: 1.04 mg/dL (ref 0.61–1.24)
GFR, Estimated: >60 mL/min (ref >60)
Glucose, Bld: 90 mg/dL (ref 70–99)
Potassium: 3.9 mmol/L (ref 3.5–5.1)
Sodium: 136 mmol/L (ref 135–145)

## 2021-02-22 LAB — CBC
HCT: 41.7 % (ref 39.0–52.0)
Hemoglobin: 13.5 g/dL (ref 13.0–17.0)
MCH: 30.3 pg (ref 26.0–34.0)
MCHC: 32.4 g/dL (ref 30.0–36.0)
MCV: 93.7 fL (ref 80.0–100.0)
Platelets: 208 10*3/uL (ref 150–400)
RBC: 4.45 MIL/uL (ref 4.22–5.81)
RDW: 12.2 % (ref 11.5–15.5)
WBC: 11.4 10*3/uL — ABNORMAL HIGH (ref 4.0–10.5)
nRBC: 0 % (ref 0.0–0.2)

## 2021-02-22 LAB — EXPECTORATED SPUTUM ASSESSMENT W GRAM STAIN, RFLX TO RESP C

## 2021-02-22 MED ORDER — VITAMIN C 250 MG PO TABS
250.0000 mg | ORAL_TABLET | Freq: Every day | ORAL | Status: DC
  Administered 2021-02-22 – 2021-03-04 (×11): 250 mg via ORAL
  Filled 2021-02-22 (×11): qty 1

## 2021-02-22 MED ORDER — DORNASE ALFA 2.5 MG/2.5ML IN SOLN
2.5000 mg | Freq: Every day | RESPIRATORY_TRACT | Status: DC
  Administered 2021-02-22 – 2021-03-04 (×11): 2.5 mg via RESPIRATORY_TRACT
  Filled 2021-02-22 (×13): qty 2.5

## 2021-02-22 MED ORDER — DOXYLAMINE SUCCINATE (SLEEP) 25 MG PO TABS
25.0000 mg | ORAL_TABLET | Freq: Every day | ORAL | Status: DC
  Administered 2021-02-22 – 2021-03-03 (×11): 25 mg via ORAL
  Filled 2021-02-22 (×12): qty 1

## 2021-02-22 MED ORDER — CALCIUM CARBONATE 1250 (500 CA) MG PO TABS
1250.0000 mg | ORAL_TABLET | Freq: Two times a day (BID) | ORAL | Status: DC
  Administered 2021-02-22 – 2021-03-04 (×21): 1250 mg via ORAL
  Filled 2021-02-22 (×21): qty 1

## 2021-02-22 MED ORDER — HYDROCODONE-ACETAMINOPHEN 7.5-325 MG PO TABS: 1.0000 | ORAL_TABLET | Freq: Three times a day (TID) | ORAL | Status: DC

## 2021-02-22 MED ORDER — VITAMIN D 25 MCG (1000 UNIT) PO TABS
2000.0000 [IU] | ORAL_TABLET | Freq: Every day | ORAL | Status: DC
Start: 2021-02-22 — End: 2021-03-04
  Administered 2021-02-22 – 2021-03-04 (×11): 2000 [IU] via ORAL
  Filled 2021-02-22 (×11): qty 2

## 2021-02-22 MED ORDER — HYDROCODONE-ACETAMINOPHEN 7.5-325 MG PO TABS
1.0000 | ORAL_TABLET | Freq: Three times a day (TID) | ORAL | Status: DC
  Administered 2021-02-22 – 2021-03-04 (×32): 1 via ORAL
  Filled 2021-02-22 (×32): qty 1

## 2021-02-22 MED ORDER — COENZYME Q10 300 MG PO CAPS: 1.0000 | ORAL_CAPSULE | Freq: Every day | ORAL | Status: DC

## 2021-02-22 MED ORDER — UMECLIDINIUM BROMIDE 62.5 MCG/ACT IN AEPB
1.0000 | INHALATION_SPRAY | Freq: Every day | RESPIRATORY_TRACT | Status: DC
  Administered 2021-02-22 – 2021-02-28 (×7): 1 via RESPIRATORY_TRACT
  Filled 2021-02-22: qty 7

## 2021-02-22 MED ORDER — FERROUS SULFATE 325 (65 FE) MG PO TABS
325.0000 mg | ORAL_TABLET | Freq: Two times a day (BID) | ORAL | Status: DC
  Administered 2021-02-22 – 2021-03-04 (×21): 325 mg via ORAL
  Filled 2021-02-22 (×21): qty 1

## 2021-02-22 MED ORDER — ALBUTEROL SULFATE (2.5 MG/3ML) 0.083% IN NEBU
3.0000 mL | INHALATION_SOLUTION | Freq: Two times a day (BID) | RESPIRATORY_TRACT | Status: DC
  Administered 2021-02-22 – 2021-03-04 (×21): 3 mL via RESPIRATORY_TRACT
  Filled 2021-02-22 (×21): qty 3

## 2021-02-22 MED ORDER — ZINC GLUCONATE 50 MG PO TABS: 50.0000 mg | ORAL_TABLET | Freq: Every day | ORAL | Status: DC

## 2021-02-22 MED ORDER — ATORVASTATIN CALCIUM 10 MG PO TABS
10.0000 mg | ORAL_TABLET | Freq: Every day | ORAL | Status: DC
  Administered 2021-02-22 – 2021-03-04 (×11): 10 mg via ORAL
  Filled 2021-02-22 (×11): qty 1

## 2021-02-22 MED ORDER — TIOTROPIUM BROMIDE MONOHYDRATE 2.5 MCG/ACT IN AERS: 2.0000 | INHALATION_SPRAY | Freq: Every day | RESPIRATORY_TRACT | Status: DC

## 2021-02-22 MED ORDER — DILTIAZEM HCL ER COATED BEADS 120 MG PO CP24
120.0000 mg | ORAL_CAPSULE | Freq: Every day | ORAL | Status: DC
  Administered 2021-02-22 – 2021-03-04 (×11): 120 mg via ORAL
  Filled 2021-02-22 (×11): qty 1

## 2021-02-22 MED ORDER — PANTOPRAZOLE SODIUM 40 MG PO TBEC
40.0000 mg | DELAYED_RELEASE_TABLET | Freq: Every day | ORAL | Status: DC
  Administered 2021-02-22 – 2021-03-04 (×11): 40 mg via ORAL
  Filled 2021-02-22 (×11): qty 1

## 2021-02-22 MED ORDER — MONTELUKAST SODIUM 10 MG PO TABS
10.0000 mg | ORAL_TABLET | Freq: Every day | ORAL | Status: DC
  Administered 2021-02-22 – 2021-03-04 (×11): 10 mg via ORAL
  Filled 2021-02-22 (×11): qty 1

## 2021-02-22 MED ORDER — MOMETASONE FURO-FORMOTEROL FUM 200-5 MCG/ACT IN AERO
2.0000 | INHALATION_SPRAY | Freq: Two times a day (BID) | RESPIRATORY_TRACT | Status: DC
  Administered 2021-02-22 – 2021-03-04 (×21): 2 via RESPIRATORY_TRACT
  Filled 2021-02-22: qty 8.8

## 2021-02-22 MED ORDER — ALBUTEROL SULFATE (2.5 MG/3ML) 0.083% IN NEBU
3.0000 mL | INHALATION_SOLUTION | RESPIRATORY_TRACT | Status: DC
Start: 2021-02-22 — End: 2021-02-22

## 2021-02-22 MED ORDER — SODIUM CHLORIDE 3 % IN NEBU
4.0000 mL | INHALATION_SOLUTION | Freq: Two times a day (BID) | RESPIRATORY_TRACT | Status: DC
  Administered 2021-02-23 – 2021-02-25 (×5): 4 mL via RESPIRATORY_TRACT
  Filled 2021-02-22 (×6): qty 4

## 2021-02-22 NOTE — Progress Notes (Signed)
PHARMACIST - PHYSICIAN ORDER COMMUNICATION  CONCERNING: P&T Medication Policy on Herbal Medications  DESCRIPTION:  This patient's order for:  coenzyme Q10 300mg  and zinc gluconate  has been noted.  This product(s) is classified as an "herbal" or natural product. Due to a lack of definitive safety studies or FDA approval, nonstandard manufacturing practices, plus the potential risk of unknown drug-drug interactions while on inpatient medications, the Pharmacy and Therapeutics Committee does not permit the use of "herbal" or natural products of this type within [facility].   ACTION TAKEN: The pharmacy department is unable to verify this order at this time and your patient has been informed of this safety policy. Please reevaluate patient's clinical condition at discharge and address if the herbal or natural product(s) should be resumed at that time.   Dolly Rias RPh 02/22/2021, 12:34 AM

## 2021-02-22 NOTE — Assessment & Plan Note (Addendum)
Chronic bronchiectasis follows up with pulmonary outpatient. Has history of Pseudomonas colonization as well as right middle lobe and right lower lobe resection. At baseline uses 2 LPM at night and on exertion. Dr. Olena Heckle was consulted on admission who is patient's primary pulmonologist at [facility]. Recommended to treat the patient with IV Fortaz, continue tobramycin nebs, continue saline nebulization, albuterol, Spiriva, Symbicort, Pulmozyme. Sputum culture growing Pseudomonas sensitivities currently pending.  Prior history of MDR Pseudomonas. AFB culture also sent.  Results pending.

## 2021-02-22 NOTE — Assessment & Plan Note (Signed)
Continue Norco for pain control.

## 2021-02-22 NOTE — Assessment & Plan Note (Signed)
Continue statin. 

## 2021-02-22 NOTE — Progress Notes (Signed)
[facility] Progress Note  Patient: [name]    HYQ:[id number]  DOA: 02/21/2021    Date of Service: the patient was seen and examined on 02/22/2021  Brief hospital course: No notes on file  Assessment and Plan: * Bronchiectasis with acute exacerbation ([place]) Chronic bronchiectasis follows up with pulmonary outpatient. Has history of Pseudomonas colonization as well as right middle lobe and right lower lobe resection. At baseline uses 2 LPM at night and on exertion. Dr. Olena Heckle was consulted on admission who is patient's primary pulmonologist at [facility]. Recommended to treat the patient with IV Fortaz, continue tobramycin nebs, continue saline nebulization, albuterol, Spiriva, Symbicort, Pulmozyme. Follow-up on cultures. AFB culture also sent. Currently improving.  Monitor.  Chronic pain Continue Norco for pain control.  History of PSVT (paroxysmal supraventricular tachycardia) Continue Cardizem.  HLD (hyperlipidemia) Continue statin.    Body mass index is 27.57 kg/m.        Subjective: Continues to have shortness of breath and cough.  No nausea or vomiting.  No chest pain.  No diarrhea.  Objective: Vital signs were reviewed and unremarkable.  Exam: General: Appear in mild distress, no Rash; Oral Mucosa Clear, moist. no Abnormal Neck Mass Or lumps, Conjunctiva normal  Cardiovascular: S1 and S2 Present, no Murmur, Respiratory: increased respiratory effort, Bilateral Air entry present and bilateral  Crackles, no wheezes Abdomen: Bowel Sound present, Soft and no tenderness Extremities: trace Pedal edema Neurology: alert and oriented to time, place, and person affect appropriate. no new focal deficit Gait not checked due to patient safety concerns     Data Reviewed: My review of labs, imaging, notes and other tests shows no new significant findings.   Disposition:  Status is: Inpatient  Remains inpatient appropriate because: Ongoing requirement with IV  antibiotic due to resistant organism.   Family Communication: None at bedside  DVT Prophylaxis: enoxaparin (LOVENOX) injection 40 mg Start: 02/21/21 2200   Time spent: 35 minutes.   Author: Berle Mull  02/22/2021 5:14 PM  To reach On-call, see care teams to locate the attending and reach out via [email or web address]. Between 7PM-7AM, please contact night-coverage If you still have difficulty reaching the attending provider, please page the [facility] (Director on Call) for [facility] on amion for assistance.

## 2021-02-22 NOTE — Assessment & Plan Note (Signed)
Continue Cardizem.

## 2021-02-23 DIAGNOSIS — R042 Hemoptysis: Secondary | ICD-10-CM | POA: Diagnosis present

## 2021-02-23 DIAGNOSIS — J471 Bronchiectasis with (acute) exacerbation: Secondary | ICD-10-CM | POA: Diagnosis not present

## 2021-02-23 NOTE — Progress Notes (Signed)
[facility] Progress Note  Patient: [name]    [id number]  DOA: 02/21/2021    Date of Service: the patient was seen and examined on 02/23/2021  Brief hospital course: 12/2 admitted with bronchiectasis flareup.  Started on IV antibiotic.  Assessment and Plan: * Bronchiectasis with acute exacerbation ([place]) Chronic bronchiectasis follows up with pulmonary outpatient. Has history of Pseudomonas colonization as well as right middle lobe and right lower lobe resection. At baseline uses 2 LPM at night and on exertion. Dr. Olena Heckle was consulted on admission who is patient's primary pulmonologist at [facility]. Recommended to treat the patient with IV Fortaz, continue tobramycin nebs, continue saline nebulization, albuterol, Spiriva, Symbicort, Pulmozyme. Follow-up on cultures.  So far no growth. AFB culture also sent.  Results pending. Currently improving.  Monitor.  Cough with hemoptysis Patient reports some streaking of blood with cough.  Most likely this is irritation and injury rather than from hemoptysis.  No clots.  Monitor.  Continue Lovenox for DVT prophylaxis.  Chronic pain Continue Norco for pain control.  History of PSVT (paroxysmal supraventricular tachycardia) Continue Cardizem.  HLD (hyperlipidemia) Continue statin.   Body mass index is 27.57 kg/m.        Subjective: Continues to have some cough continues to have some streaking of blood with cough.  No nausea no vomiting.  No chest pain.  No abdominal pain.  No diarrhea.  Objective: Vital signs were reviewed and unremarkable.  Exam: General: Appear in mild distress, no Rash; Oral Mucosa Clear, moist. no Abnormal Neck Mass Or lumps, Conjunctiva normal  Cardiovascular: S1 and S2 Present, no Murmur, Respiratory: increased respiratory effort, Bilateral Air entry present and bilateral  Crackles, no wheezes Abdomen: Bowel Sound present, Soft and no tenderness Extremities: trace Pedal edema Neurology: alert and  oriented to time, place, and person affect appropriate. no new focal deficit Gait not checked due to patient safety concerns     Data Reviewed: There are no new results to review at this time.  Disposition:  Status is: Inpatient  Remains inpatient appropriate because: Infection with MDRO requiring IV antibiotic  Family Communication: None at bedside.  DVT Prophylaxis: enoxaparin (LOVENOX) injection 40 mg Start: 02/21/21 2200   Time spent: 35 minutes.   Author: Berle Mull  02/23/2021 3:53 PM  To reach On-call, see care teams to locate the attending and reach out via [email or web address]. Between 7PM-7AM, please contact night-coverage If you still have difficulty reaching the attending provider, please page the [facility] (Director on Call) for [facility] on amion for assistance.

## 2021-02-23 NOTE — Assessment & Plan Note (Addendum)
Patient had initially some streaking of blood with mucus.  Also 6 reports worsening blood in the sputum. Pulmonary consulted. Discontinue Lovenox. Patient will receive tranexamic acid nebulizer therapy. Hold hypertonic saline. Chest vest ordered as well. IV antibiotic changed from [place] to meropenem. N.p.o. after midnight in case he requires bronchoscopy. Type and screen. Aggressive cough therapy. Switch to telemetry.

## 2021-02-23 NOTE — Hospital Course (Addendum)
12/2 admitted with bronchiectasis flareup.  Started on IV antibiotic. 12/4 sputum culture grew Pseudomonas.  Sensitivity pending. 12/6 reports hemoptysis worsening, pulmonary consulted.  DVT prophylaxis discontinued.  TXA nebulizer therapy given.

## 2021-02-24 DIAGNOSIS — J471 Bronchiectasis with (acute) exacerbation: Secondary | ICD-10-CM | POA: Diagnosis not present

## 2021-02-24 LAB — CBC WITH DIFFERENTIAL/PLATELET
Abs Immature Granulocytes: 0.03 10*3/uL (ref 0.00–0.07)
Basophils Absolute: 0 10*3/uL (ref 0.0–0.1)
Basophils Relative: 1 %
Eosinophils Absolute: 0.3 10*3/uL (ref 0.0–0.5)
Eosinophils Relative: 4 %
HCT: 45.4 % (ref 39.0–52.0)
Hemoglobin: 14.8 g/dL (ref 13.0–17.0)
Immature Granulocytes: 0 %
Lymphocytes Relative: 19 %
Lymphs Abs: 1.4 10*3/uL (ref 0.7–4.0)
MCH: 30.2 pg (ref 26.0–34.0)
MCHC: 32.6 g/dL (ref 30.0–36.0)
MCV: 92.7 fL (ref 80.0–100.0)
Monocytes Absolute: 0.6 10*3/uL (ref 0.1–1.0)
Monocytes Relative: 9 %
Neutro Abs: 4.8 10*3/uL (ref 1.7–7.7)
Neutrophils Relative %: 67 %
Platelets: 204 10*3/uL (ref 150–400)
RBC: 4.9 MIL/uL (ref 4.22–5.81)
RDW: 12 % (ref 11.5–15.5)
WBC: 7.2 10*3/uL (ref 4.0–10.5)
nRBC: 0 % (ref 0.0–0.2)

## 2021-02-24 LAB — COMPREHENSIVE METABOLIC PANEL
ALT: 25 U/L (ref 0–44)
AST: 24 U/L (ref 15–41)
Albumin: 3.8 g/dL (ref 3.5–5.0)
Alkaline Phosphatase: 84 U/L (ref 38–126)
Anion gap: 12 (ref 5–15)
BUN: 23 mg/dL (ref 8–23)
CO2: 27 mmol/L (ref 22–32)
Calcium: 9.4 mg/dL (ref 8.9–10.3)
Chloride: 98 mmol/L (ref 98–111)
Creatinine, Ser: 0.96 mg/dL (ref 0.61–1.24)
GFR, Estimated: >60 mL/min (ref >60)
Glucose, Bld: 135 mg/dL — ABNORMAL HIGH (ref 70–99)
Potassium: 4.2 mmol/L (ref 3.5–5.1)
Sodium: 137 mmol/L (ref 135–145)
Total Bilirubin: 0.6 mg/dL (ref 0.3–1.2)
Total Protein: 7.8 g/dL (ref 6.5–8.1)

## 2021-02-24 NOTE — Progress Notes (Signed)
[facility] Progress Note  Patient: [name]    JSH:[id number]  DOA: 02/21/2021    Date of Service: the patient was seen and examined on 02/24/2021  Brief hospital course: 12/2 admitted with bronchiectasis flareup.  Started on IV antibiotic.  Assessment and Plan: * Bronchiectasis with acute exacerbation ([place]) Chronic bronchiectasis follows up with pulmonary outpatient. Has history of Pseudomonas colonization as well as right middle lobe and right lower lobe resection. At baseline uses 2 LPM at night and on exertion. Dr. Olena Heckle was consulted on admission who is patient's primary pulmonologist at [facility]. Recommended to treat the patient with IV Fortaz, continue tobramycin nebs, continue saline nebulization, albuterol, Spiriva, Symbicort, Pulmozyme. Sputum culture growing Pseudomonas sensitivities currently pending.  Prior history of MDR Pseudomonas. AFB culture also sent.  Results pending. Currently improving.  Monitor. Once sensitivities available we will discuss with patient's primary pulmonologist regarding duration of the antibiotics.  Although I suspect patient would not need more than 5 days worth of antibiotics.  Cough with hemoptysis Patient had some streaking of blood with mucus.  Currently improving. Most likely this is irritation and injury rather than from hemoptysis.  No clots.  Monitor.  Continue Lovenox for DVT prophylaxis.  Chronic pain Continue Norco for pain control.  History of PSVT (paroxysmal supraventricular tachycardia) Continue Cardizem.  HLD (hyperlipidemia) Continue statin.    Body mass index is 27.57 kg/m.        Subjective: Improving blood in his sputum.  No nausea no vomiting.  No chest pain abdominal pain.  Still fatigue and tired.  Objective: Vital signs were reviewed and unremarkable.  Exam: General: Appear in mild distress, no Rash; Oral Mucosa Clear, moist. no Abnormal Neck Mass Or lumps, Conjunctiva normal  Cardiovascular: S1 and  S2 Present, no Murmur, Respiratory: increased respiratory effort, Bilateral Air entry present and bilateral  Crackles, no wheezes Abdomen: Bowel Sound present, Soft and no tenderness Extremities: trace Pedal edema Neurology: alert and oriented to time, place, and person affect appropriate. no new focal deficit Gait not checked due to patient safety concerns     Data Reviewed: My review of labs, imaging, notes and other tests shows no new significant findings.   Disposition:  Status is: Inpatient  Remains inpatient appropriate because: Requiring IV antibiotics for multidrug-resistant Pseudomonas infection.   Family Communication: None at bedside.  DVT Prophylaxis: enoxaparin (LOVENOX) injection 40 mg Start: 02/21/21 2200   Time spent: 35 minutes.   Author: Berle Mull  02/24/2021 7:25 PM  To reach On-call, see care teams to locate the attending and reach out via [email or web address]. Between 7PM-7AM, please contact night-coverage If you still have difficulty reaching the attending provider, please page the [facility] (Director on Call) for [facility] on amion for assistance.

## 2021-02-25 ENCOUNTER — Inpatient Hospital Stay (HOSPITAL_COMMUNITY): Payer: Medicare Other

## 2021-02-25 DIAGNOSIS — Z8679 Personal history of other diseases of the circulatory system: Secondary | ICD-10-CM

## 2021-02-25 DIAGNOSIS — J471 Bronchiectasis with (acute) exacerbation: Secondary | ICD-10-CM | POA: Diagnosis not present

## 2021-02-25 DIAGNOSIS — E785 Hyperlipidemia, unspecified: Secondary | ICD-10-CM

## 2021-02-25 DIAGNOSIS — R627 Adult failure to thrive: Secondary | ICD-10-CM | POA: Diagnosis present

## 2021-02-25 DIAGNOSIS — R042 Hemoptysis: Secondary | ICD-10-CM

## 2021-02-25 LAB — TYPE AND SCREEN
ABO/RH(D): O POS
Antibody Screen: NEGATIVE

## 2021-02-25 LAB — CBC WITH DIFFERENTIAL/PLATELET
Abs Immature Granulocytes: 0.03 10*3/uL (ref 0.00–0.07)
Basophils Absolute: 0 10*3/uL (ref 0.0–0.1)
Basophils Relative: 0 %
Eosinophils Absolute: 0.2 10*3/uL (ref 0.0–0.5)
Eosinophils Relative: 1 %
HCT: 44.7 % (ref 39.0–52.0)
Hemoglobin: 15.1 g/dL (ref 13.0–17.0)
Immature Granulocytes: 0 %
Lymphocytes Relative: 9 %
Lymphs Abs: 1.2 10*3/uL (ref 0.7–4.0)
MCH: 30.8 pg (ref 26.0–34.0)
MCHC: 33.8 g/dL (ref 30.0–36.0)
MCV: 91 fL (ref 80.0–100.0)
Monocytes Absolute: 1.2 10*3/uL — ABNORMAL HIGH (ref 0.1–1.0)
Monocytes Relative: 9 %
Neutro Abs: 10.4 10*3/uL — ABNORMAL HIGH (ref 1.7–7.7)
Neutrophils Relative %: 81 %
Platelets: 201 10*3/uL (ref 150–400)
RBC: 4.91 MIL/uL (ref 4.22–5.81)
RDW: 11.9 % (ref 11.5–15.5)
WBC: 13 10*3/uL — ABNORMAL HIGH (ref 4.0–10.5)
nRBC: 0 % (ref 0.0–0.2)

## 2021-02-25 LAB — PROTIME-INR
INR: 0.9 (ref 0.8–1.2)
Prothrombin Time: 12.4 seconds (ref 11.4–15.2)

## 2021-02-25 MED ORDER — AZITHROMYCIN 250 MG PO TABS
250.0000 mg | ORAL_TABLET | Freq: Every day | ORAL | Status: DC
  Administered 2021-02-25 – 2021-03-04 (×8): 250 mg via ORAL
  Filled 2021-02-25 (×8): qty 1

## 2021-02-25 MED ORDER — HYDROCODONE BIT-HOMATROP MBR 5-1.5 MG/5ML PO SOLN: 5.0000 mL | Freq: Three times a day (TID) | ORAL | Status: DC

## 2021-02-25 MED ORDER — SODIUM CHLORIDE 0.9 % IV SOLN
1.0000 g | Freq: Three times a day (TID) | INTRAVENOUS | Status: DC
  Administered 2021-02-25 – 2021-03-03 (×16): 1 g via INTRAVENOUS
  Filled 2021-02-25 (×19): qty 1

## 2021-02-25 MED ORDER — ENSURE ENLIVE PO LIQD
237.0000 mL | Freq: Two times a day (BID) | ORAL | Status: DC
  Administered 2021-02-26 – 2021-03-04 (×11): 237 mL via ORAL

## 2021-02-25 MED ORDER — DEXTROMETHORPHAN POLISTIREX ER 30 MG/5ML PO SUER
30.0000 mg | Freq: Two times a day (BID) | ORAL | Status: DC
  Administered 2021-02-25 – 2021-03-04 (×14): 30 mg via ORAL
  Filled 2021-02-25 (×15): qty 5

## 2021-02-25 MED ORDER — HYDROCOD POLST-CPM POLST ER 10-8 MG/5ML PO SUER
5.0000 mL | Freq: Two times a day (BID) | ORAL | Status: DC | PRN
  Administered 2021-02-28 – 2021-03-01 (×2): 5 mL via ORAL
  Filled 2021-02-25 (×2): qty 5

## 2021-02-25 MED ORDER — TRANEXAMIC ACID FOR INHALATION
500.0000 mg | Freq: Once | RESPIRATORY_TRACT | Status: AC
  Administered 2021-02-25: 500 mg via RESPIRATORY_TRACT
  Filled 2021-02-25: qty 10

## 2021-02-25 NOTE — Consult Note (Signed)
NAME:  [name], MRN:  [id number], DOB:  [birth year], LOS: 3 ADMISSION DATE:  02/21/2021, CONSULTATION DATE:  02/25/21 REFERRING MD:  Posey Pronto CHIEF COMPLAINT:  bronchiectasis with hemoptysis    History of Present Illness:  [name] is a 65 y.o. M with PMH significant for chronic bronchiectasis and pseudomonas colonization on 2L home O2, RML and RLL lung resections secondary to abscess,  HTN, OSA, GERD, prostate Ca who is followed by [facility] pulmonology outpatient who began experiencing chest congestion, cough, fatigue and increased sputum starting early November.  He was given 14 day course of Cipro with continued inhaled Tobramycin and chronic Azithromycin.   Pt reports he felt slightly better for about 5 days and then noted progressively worsening dyspnea and coughing so presented to primary care where he was negative for covid-19, RSV and flu.  When he continued to worsen he presented to [facility] ED and again had a negative viral panel.   He was stable on 2L O2 and CXR with chronic volume loss and bibasilar bronchiectasis.  He noted blood streaked sputum and night sweats and was admitted to the hospitalist service.    His primary pulmonologist Dr. Olena Heckle was contacted who recommeded Ceftazidime, tobramycin nebs, albuterol, spiriva, symbicort and pulmozyme.  Last sputum culture in care everywhere from 11/17/20 grew MDR Pseudomonas susceptible to Ceftaz and Zosyn, AFB negative.  After admission on 12/6 blood tinged sputum worsened and PCCM was consulted.   Pt reports no worsened cough and dyspnea, remains on 2L [place], about 1/8 cup dark red sputum noted from all day.  Pertinent  Medical History   has a past medical history of Bronchiectasis ([place]), Bronchiolitis, Dizziness and giddiness (07/26/2012), DJD (degenerative joint disease), Dyspnea, Gastroesophageal reflux disease, History of kidney stones (yrs ago), HTN (hypertension), OSA (obstructive sleep apnea), Pneumonia (last time 2017), Pneumonia with  lung abscess ([place]), PONV (postoperative nausea and vomiting), Prostate cancer ([place]) (dx Aug 12, 2017), and Tear of left rotator cuff.   Significant Hospital Events: Including procedures, antibiotic start and stop dates in addition to other pertinent events   12/2 admit to [facility], started on [place] 12/6 worsened hemoptysis  Interim History / Subjective:  Pt reports that he feels slightly better since admission Increased maroon sputum today, see photo below No worsening dyspnea  Objective   Blood pressure 122/81, pulse 95, temperature 98.7 F (37.1 C), temperature source Oral, resp. rate 20, height 5\' 7"  (1.702 m), weight 79.8 kg, SpO2 92 %.        Intake/Output Summary (Last 24 hours) at 02/25/2021 1556 Last data filed at 02/25/2021 0600 Gross per 24 hour  Intake 447.71 ml  Output --  Net 447.71 ml   Filed Weights   02/21/21 1238  Weight: 79.8 kg   General:  thin, chronically ill-appearing M sitting up in chair in no acute distsress HEENT: MM pink/moist, [place] in place, sclera anicteric Neuro: awake, alert, oriented and moving all extremities CV: s1s2 rrr, no m/r/g PULM:  good air movement bilaterally without significant wheezing or rhonchi, coughing up dark bloody sputum, approximately 1/8 c all day GI: soft, bsx4 active  Extremities: warm/dry, no edema  Skin: no rashes or lesions       Resolved Hospital Problem list     Assessment & Plan:    Acute bronchiectasis flare Acute on chronic hypoxic respiratory failure Acute Hemoptysis  Pseudomonas colonization Followed by Dr. Olena Heckle at [facility], on chronic azithromycin and Colistin with recent 14 day course of Cipro  At  baseline on 2L qhs, now on 2L continuously  Dark maroon sputum, worsened since yesterday but small volume  -continue Ceftaz, inhaled Tobramycin, Spiriva, Dulera/Incruse and Pulmozyme -follow pseudomonas sensitivities  -repeat chest CT, flutter valve  -hold hypertonic saline nebs in the setting of  hemoptysis -continue chest vest -Respiratory culture growing pseudomonas with susceptibilities pending, continue to follow  -if hemoptysis were to worsen consider bronchoscopy to attempt to localize bleeding, NPO at midnight -rest of plan per primary team  Thank you for this consult, we will continue to follow with you   Best Practice (right click and "Reselect all SmartList Selections" daily)   Per primary  Labs   CBC: Recent Labs  Lab 02/21/21 1309 02/22/21 0502 02/24/21 1411  WBC 15.4* 11.4* 7.2  NEUTROABS 13.5*  --  4.8  HGB 14.9 13.5 14.8  HCT 45.1 41.7 45.4  MCV 91.5 93.7 92.7  PLT 235 208 294    Basic Metabolic Panel: Recent Labs  Lab 02/21/21 1309 02/22/21 0502 02/24/21 1411  NA 138 136 137  K 4.2 3.9 4.2  CL 103 105 98  CO2 23 25 27   GLUCOSE 116* 90 135*  BUN 24* 31* 23  CREATININE 1.02 1.04 0.96  CALCIUM 10.4* 8.6* 9.4   GFR: Estimated Creatinine Clearance: 76.7 mL/min (by C-G formula based on SCr of 0.96 mg/dL). Recent Labs  Lab 02/21/21 1309 02/22/21 0502 02/24/21 1411  WBC 15.4* 11.4* 7.2    Liver Function Tests: Recent Labs  Lab 02/24/21 1411  AST 24  ALT 25  ALKPHOS 84  BILITOT 0.6  PROT 7.8  ALBUMIN 3.8   No results for input(s): LIPASE, AMYLASE in the last 168 hours. No results for input(s): AMMONIA in the last 168 hours.  ABG    Component Value Date/Time   PHART 7.377 12/06/2010 0508   PCO2ART 47.4 (H) 12/06/2010 0508   PO2ART 85.0 12/06/2010 0508   HCO3 24.2 05/23/2018 1545   TCO2 27 12/06/2010 2022   ACIDBASEDEF 0.2 05/23/2018 1545   O2SAT 77.9 05/23/2018 1545     Coagulation Profile: No results for input(s): INR, PROTIME in the last 168 hours.  Cardiac Enzymes: No results for input(s): CKTOTAL, CKMB, CKMBINDEX, TROPONINI in the last 168 hours.  HbA1C: No results found for: HGBA1C  CBG: No results for input(s): GLUCAP in the last 168 hours.  Review of Systems:   Review of Systems  Constitutional:   Positive for malaise/fatigue. Negative for fever.  Respiratory:  Positive for cough, hemoptysis, sputum production and shortness of breath. Negative for wheezing.   Cardiovascular:  Negative for chest pain and leg swelling.  Gastrointestinal:  Negative for nausea.  Musculoskeletal:  Positive for back pain. Negative for myalgias.    Past Medical History:  He,  has a past medical history of Bronchiectasis ([place]), Bronchiolitis, Dizziness and giddiness (07/26/2012), DJD (degenerative joint disease), Dyspnea, Gastroesophageal reflux disease, History of kidney stones (yrs ago), HTN (hypertension), OSA (obstructive sleep apnea), Pneumonia (last time 2017), Pneumonia with lung abscess ([place]), PONV (postoperative nausea and vomiting), Prostate cancer ([place]) (dx Aug 12, 2017), and Tear of left rotator cuff.   Surgical History:   Past Surgical History:  Procedure Laterality Date   APPENDECTOMY  1969   BACK SURGERY     multiple, DJD, cervical fusion, plate removed   CARPAL TUNNEL RELEASE Right 1999   COLONOSCOPY  12/1980   bleeding from penis and anus after beach trip; colonoscopy and removed urinary stricture at same time   KNEE ARTHROSCOPY  left   LUNG SURGERY  2012,    removed right middle lobe,due to birth defect, removed half of right bottom lobe due to abcess   LUNG SURGERY  07/2013   removed bottom right middle and lower lobe   LYMPHADENECTOMY Bilateral 12/08/2017   Procedure: LYMPHADENECTOMY;  Surgeon: Alexis Frock, MD;  Location: [facility];  Service: Urology;  Laterality: Bilateral;   NECK SURGERY  [id number], 2002   OTHER SURGICAL HISTORY  2016   removed 4" of lower right rib   OTHER SURGICAL HISTORY     has had 8 PICC lines placed over the course of several years.   Right video-assisted thoracoscopic surgery,  12/05/2010   ROBOT ASSISTED LAPAROSCOPIC RADICAL PROSTATECTOMY N/A 12/08/2017   Procedure: XI ROBOTIC ASSISTED LAPAROSCOPIC RADICAL PROSTATECTOMY;  Surgeon: Alexis Frock, MD;   Location: [facility];  Service: Urology;  Laterality: N/A;   TONSILLECTOMY  1965   VIDEO BRONCHOSCOPY     Dr Arlyce Dice     Social History:   reports that he has never smoked. He has never used smokeless tobacco. He reports that he does not drink alcohol and does not use drugs.   Family History:  His family history includes Alzheimer's disease in his mother; Ataxia in his brother and maternal aunt; Chorea in his mother; Congestive Heart Failure in his father; Diabetes in his father; Liver disease in his father; Other in an other family member; Prostate cancer in his cousin, cousin, cousin, and maternal grandfather; Stroke in his mother; Transient ischemic attack in his mother.   Allergies Allergies  Allergen Reactions   Morphine Nausea And Vomiting   Morphine And Related Nausea And Vomiting   Tobramycin Nausea And Vomiting and Other (See Comments)    Ototoxicity, when administered in IV  02/22/21 pt reports no allergy/intolerance to current tobramycin nebulizing soln   Gentamicin Other (See Comments)    BALANCE ISSUES     Home Medications  Prior to Admission medications   Medication Sig Start Date End Date Taking? Authorizing Provider  acidophilus (RISAQUAD) CAPS capsule Take 2 capsules by mouth daily. 10/09/19  Yes Elgergawy, Silver Huguenin, MD  albuterol (PROVENTIL) (2.5 MG/3ML) 0.083% nebulizer solution Inhale 2.5 mg into the lungs 4 (four) times daily.    Yes [provider]  alendronate (FOSAMAX) 70 MG tablet Take 70 mg by mouth once a week. 08/01/20  Yes [provider]  ascorbic acid (VITAMIN C) 250 MG CHEW Chew 1,000 mg by mouth daily.    Yes [provider]  atorvastatin (LIPITOR) 10 MG tablet Take 10 mg by mouth daily. 09/16/20  Yes [provider]  azithromycin (ZITHROMAX) 250 MG tablet Take 1 tablet (250 mg total) by mouth daily. 10/19/19  Yes Elgergawy, Silver Huguenin, MD  budesonide-formoterol (SYMBICORT) 160-4.5 MCG/ACT inhaler Inhale 2 puffs into the lungs  2 (two) times daily.   Yes [provider]  calcium carbonate (OS-CAL) 600 MG TABS tablet Take 600 mg by mouth 2 (two) times daily with a meal.   Yes [provider]  Cholecalciferol 50 MCG (2000 UT) TABS Take 2,000 Units by mouth daily.   Yes [provider]  Coenzyme Q10 300 MG CAPS Take 1 capsule by mouth daily. 04/23/20  Yes [provider]  Colistimethate Sodium POWD Take 2 mLs by nebulization 2 (two) times daily. Use only every other month. 150mg  injection.   Yes [provider]  diltiazem (CARDIZEM CD) 120 MG 24 hr capsule Take 1 capsule (120 mg total) by mouth  daily. 12/23/20  Yes Patwardhan, Manish J, MD  dornase alpha (PULMOZYME) 1 MG/ML nebulizer solution Take 2.5 mg by nebulization daily. Use at least 30-60 minutes before airway clearance or after airway clearance   Yes [provider]  doxylamine, Sleep, (UNISOM) 25 MG tablet Take 25 mg by mouth at bedtime.   Yes [provider]  ferrous sulfate 325 (65 FE) MG tablet Take 325 mg by mouth 2 (two) times daily.   Yes [provider]  fluticasone (FLONASE) 50 MCG/ACT nasal spray Place 1 spray into both nostrils daily.   Yes [provider]  HYDROcodone-acetaminophen (NORCO) 7.5-325 MG tablet Take 1 tablet by mouth every 6 (six) hours as needed for moderate pain. Patient taking differently: Take 1 tablet by mouth 3 (three) times daily. 12/08/17  Yes Dancy, Amanda, PA-C  montelukast (SINGULAIR) 10 MG tablet Take 1 tablet by mouth daily. 12/21/19  Yes [provider]  Multiple Vitamin (MULTIVITAMIN WITH MINERALS) TABS tablet Take 1 tablet by mouth daily.   Yes [provider]  NON FORMULARY Use Acapella twice daily for airway clearance. Dx. 494.0. Mucous clearing device. 11/18/12  Yes [provider]  omeprazole (PRILOSEC) 20 MG capsule Take 20 mg by mouth 2 (two) times daily.    Yes [provider]  Sodium Chloride 10 % NEBU Inhale 5  mLs into the lungs 2 (two) times daily. 09/08/19  Yes [provider]  SPIRIVA RESPIMAT 2.5 MCG/ACT AERS Inhale 1 puff into the lungs in the morning and at bedtime. 08/16/20  Yes [provider]  tobramycin, PF, ([name]) 300 MG/5ML nebulizer solution Take 300 mg by nebulization 2 (two) times daily. Use only every other month   Yes [provider]  Water For Irrigation, Sterile (STERILE WATER FOR IRRIGATION) 4 mLs daily. Mix 25ml with colistimethate powder to use for nebulization. 28 days on and 28 days off.   Yes [provider]  zinc gluconate 50 MG tablet Take 50 mg by mouth daily.   Yes [provider]  albuterol (PROVENTIL HFA;VENTOLIN HFA) 108 (90 Base) MCG/ACT inhaler Inhale 2 puffs into the lungs every 4 (four) hours as needed for wheezing or shortness of breath.  Patient not taking: Reported on 02/22/2021    [provider]  Respiratory Therapy Supplies (FLUTTER) DEVI Use several times daily as needed for congestion/thick mucus    [provider]     Critical care time: n/a    Otilio Carpen Lorenza Winkleman, PA-C  Pulmonary & Critical care See Amion for pager If no response to pager , please call [phone] until 7pm After 7:00 pm call Elink  [phone]

## 2021-02-25 NOTE — Assessment & Plan Note (Signed)
Prior history. 

## 2021-02-25 NOTE — Progress Notes (Signed)
[facility] Progress Note  Patient: [name]    WUG:[id number]  DOA: 02/21/2021    Date of Service: the patient was seen and examined on 02/25/2021  Brief hospital course: 12/2 admitted with bronchiectasis flareup.  Started on IV antibiotic. 12/4 sputum culture grew Pseudomonas.  Sensitivity pending. 12/6 reports hemoptysis worsening, pulmonary consulted.  DVT prophylaxis discontinued.  TXA nebulizer therapy given.  Assessment and Plan: * Bronchiectasis with acute exacerbation ([place]) Chronic bronchiectasis follows up with pulmonary outpatient. Has history of Pseudomonas colonization as well as right middle lobe and right lower lobe resection. At baseline uses 2 LPM at night and on exertion. Dr. Olena Heckle was consulted on admission who is patient's primary pulmonologist at [facility]. Recommended to treat the patient with IV Fortaz, continue tobramycin nebs, continue saline nebulization, albuterol, Spiriva, Symbicort, Pulmozyme. Sputum culture growing Pseudomonas sensitivities currently pending.  Prior history of MDR Pseudomonas. AFB culture also sent.  Results pending.  Cough with hemoptysis Patient had initially some streaking of blood with mucus.  Also 6 reports worsening blood in the sputum. Pulmonary consulted. Discontinue Lovenox. Patient will receive tranexamic acid nebulizer therapy. Hold hypertonic saline. Chest vest ordered as well. IV antibiotic changed from [place] to meropenem. N.p.o. after midnight in case he requires bronchoscopy. Type and screen. Aggressive cough therapy. Switch to telemetry.  Chronic pain Continue Norco for pain control.  History of PSVT (paroxysmal supraventricular tachycardia) Continue Cardizem.  Failure to thrive in adult Secondary to ongoing recurrent infection and bronchiectasis at baseline. 10 pound weight loss last 1 month.  Now on regular diet. Will consult dietary as well.   HLD (hyperlipidemia) Continue statin.  Malignant  neoplasm of prostate [facility]) Prior history.    Subjective: Reports bright red blood with increased volume.  Also reports left-sided chest pain along with cough.  No nausea but had an episode of posttussive vomiting. No fever no chills.  Oral intake reduced today.  Objective: Tachypneic.  Tachycardic.  Exam: General: Appear in mild distress, no Rash; Oral Mucosa Clear, moist. no Abnormal Neck Mass Or lumps, Conjunctiva normal  Cardiovascular: S1 and S2 Present, no Murmur, Respiratory: increased respiratory effort, Bilateral Air entry present and bilateral  Crackles, no wheezes Abdomen: Bowel Sound present, Soft and no tenderness Extremities: trace Pedal edema Neurology: alert and oriented to time, place, and person affect appropriate. no new focal deficit Gait not checked due to patient safety concerns     Data Reviewed: Worsening leukocytosis, stable hemoglobin, normal INR.  Disposition:  Status is: Inpatient  Remains inpatient appropriate because: Now has hemoptysis requiring aggressive therapy and pulmonary consultation.  Antibiotics changed.   Family Communication: None at bedside.  DVT Prophylaxis: Place and maintain sequential compression device Start: 02/25/21 1538   Time spent: 35 minutes.   Author: Berle Mull  02/25/2021 7:50 PM  To reach On-call, see care teams to locate the attending and reach out via [email or web address]. Between 7PM-7AM, please contact night-coverage If you still have difficulty reaching the attending provider, please page the [facility] (Director on Call) for [facility] on amion for assistance.

## 2021-02-25 NOTE — Assessment & Plan Note (Addendum)
Secondary to ongoing recurrent infection and bronchiectasis at baseline. 10 pound weight loss last 1 month.  Now on regular diet. Will consult dietary as well.

## 2021-02-25 NOTE — Progress Notes (Signed)
Pt was yellow MEWS at 2200 for pulse 109 and RR 22. Pt was recovering from coughing. About half an hour later Pt reported to RN he vomited which is "usual" when he has bronchiectasis exacerbation. RN rechecked vitals at 2340 after patient finished getting ready for bed and had lay down for a couple minutes. Vitals were green MEWS at that time. RN will continue to monitor vitals.

## 2021-02-25 NOTE — Progress Notes (Signed)
Pharmacy Antibiotic Note  [name] is a 65 y.o. male admitted on 02/21/2021 with hemoptysis, chest congestion, cough, and fatigue.  Patient has a history of longstanding bronchiectasis and Pseudomonas colonization (follows up with [facility] pulm).  Pseudomonas in the past has been susceptible to ceftazidime, however with worsening hemoptysis CCM would like to broaden abx.  Pharmacy has been consulted for meropenem dosing.  Plan: Meropenem 1g IV q8 hours D/c ceftazidime Azithromycin per MD F/u clinical improvement, CCM recs, culture data  Height: 5\' 7"  (170.2 cm) Weight: 79.8 kg (176 lb) IBW/kg (Calculated) : 66.1  Temp (24hrs), Avg:98.2 F (36.8 C), Min:97.6 F (36.4 C), Max:98.7 F (37.1 C)  Recent Labs  Lab 02/21/21 1309 02/22/21 0502 02/24/21 1411 02/25/21 1817  WBC 15.4* 11.4* 7.2 13.0*  CREATININE 1.02 1.04 0.96  --     Estimated Creatinine Clearance: 76.7 mL/min (by C-G formula based on SCr of 0.96 mg/dL).    Allergies  Allergen Reactions   Morphine Nausea And Vomiting   Morphine And Related Nausea And Vomiting   Tobramycin Nausea And Vomiting and Other (See Comments)    Ototoxicity, when administered in IV  02/22/21 pt reports no allergy/intolerance to current tobramycin nebulizing soln   Gentamicin Other (See Comments)    BALANCE ISSUES    Antimicrobials this admission: Ceftazidime 12/2 >> 12/6 Meropenem 12/6 >>  Inhaled tobramycin 12/2 >>  Dose adjustments this admission:   Microbiology results: 12/2 BCx: ngtd 12/2 Sputum: Few Pseudomonas aeruginosa, few GPC   Thank you for allowing pharmacy to be a part of this patient's care.  Dimple Nanas, PharmD 02/25/2021 6:27 PM

## 2021-02-26 DIAGNOSIS — R627 Adult failure to thrive: Secondary | ICD-10-CM

## 2021-02-26 LAB — CULTURE, BLOOD (ROUTINE X 2)
Culture: NO GROWTH
Culture: NO GROWTH
Special Requests: ADEQUATE
Special Requests: ADEQUATE

## 2021-02-26 NOTE — Progress Notes (Signed)
PROGRESS NOTE    [name]  NID:[id number] DOB: May 26, 1955 DOA: 02/21/2021 PCP: Aura Dials, MD    Brief Narrative:  This 65 years old male with PMH significant for bronchiectasis with chronic colonization of Pseudomonas, kidney stones, hypertension, OSA, GERD, prostate cancer, s/p right middle and right lower lobe lobectomy due to pulmonary infections presented with worsening shortness of breath despite treatment with ciprofloxacin as an outpatient. He developed hemoptysis 5 days while being on antibiotics.  Pulmonology is consulted,  hemoptysis is improving with some adjustment in medications there is no plan for bronchoscopy at this time.  Assessment & Plan:   Principal Problem:   Bronchiectasis with acute exacerbation ([place]) Active Problems:   Malignant neoplasm of prostate (HCC)   History of PSVT (paroxysmal supraventricular tachycardia)   HLD (hyperlipidemia)   Chronic pain   Cough with hemoptysis   Failure to thrive in adult  Bronchiectasis with acute exacerbation: Patient has chronic bronchiectasis with chronic Pseudomonas colonization. Patient has been following up with Dr. Olena Heckle at [facility]( primary pulmonologist). Patient at baseline uses 2 L of supplemental oxygen at night or on exertion. Pulmonology recommended to continue IV Fortaz, tobramycin nebs and continue saline nebulization, albuterol, Spiriva, Symbicort and Pulmozyme. Sputum culture growing Pseudomonas sensitivities pending. AFB cultures also sent results pending.  Hemoptysis: > Improving Patient initially had some streaking of blood with mucus. The patient has worsening blood in the sputum. Pulmonology consulted. Medications adjusted.   Patient has received tranexamic acid nebulizer therapy IV antibiotic changed from [place] to meropenem. Aggressive Therapy. Pulmonology states there is no need for bronchoscopy at this time unless patient has recurrence.  Chronic pain: Continue Norco  History of  paroxysmal SVT: Continue Cardizem  Hyperlipidemia: Continue statins  Prior history of prostate cancer: Stable.  Failure to thrive in adult: Secondary to ongoing recurrent infection,  bronchiectasis at baseline. Continue regular diet dietary consult.    DVT prophylaxis: SCDs Code Status: Full code. Family Communication: Family at bedside Disposition Plan:  Status is: Inpatient  Remains inpatient appropriate because: Hemoptysis requiring pulmonology evaluation may require bronchoscopy.  Consultants:  Pulmonology  Procedures:   Antimicrobials:  Anti-infectives (From admission, onward)    Start     Dose/Rate Route Frequency Ordered Stop   02/25/21 1915  azithromycin (ZITHROMAX) tablet 250 mg        250 mg Oral Daily 02/25/21 1821     02/25/21 1830  meropenem (MERREM) 1 g in sodium chloride 0.9 % 100 mL IVPB        1 g 200 mL/hr over 30 Minutes Intravenous Every 8 hours 02/25/21 1823     02/22/21 0000  cefTAZidime (FORTAZ) 2 g in sodium chloride 0.9 % 100 mL IVPB  Status:  Discontinued        2 g 200 mL/hr over 30 Minutes Intravenous Every 8 hours 02/21/21 2048 02/21/21 2050   02/22/21 0000  cefTAZidime (FORTAZ) 1 g in sodium chloride 0.9 % 100 mL IVPB  Status:  Discontinued        1 g 200 mL/hr over 30 Minutes Intravenous Every 8 hours 02/21/21 2050 02/25/21 1818   02/21/21 2230  tobramycin (PF) ([name]) nebulizer solution 60 mg  Status:  Discontinued        60 mg Nebulization 2 times daily 02/21/21 2123 02/21/21 2214   02/21/21 2230  tobramycin (PF) ([name]) nebulizer solution 300 mg        300 mg Nebulization 2 times daily 02/21/21 2214     02/21/21 1515  cefTAZidime (FORTAZ) 2 g in sodium chloride 0.9 % 100 mL IVPB  Status:  Discontinued        2 g 200 mL/hr over 30 Minutes Intravenous Every 8 hours 02/21/21 1513 02/21/21 2047        Subjective: Patient was seen and examined at bedside.  Overnight events noted.   Patient reports hemoptysis is improving.  Denies any  chest pain and shortness of breath.   Patient is sitting comfortably on the chair.  Objective: Vitals:   02/25/21 2340 02/26/21 0459 02/26/21 1034 02/26/21 1300  BP: 106/62 (!) 100/59  111/64  Pulse: 91 72  92  Resp: 17 18  18   Temp: 99 F (37.2 C) 98.3 F (36.8 C)  98.1 F (36.7 C)  TempSrc:    Oral  SpO2: 95% 98% 98% 96%  Weight:      Height:        Intake/Output Summary (Last 24 hours) at 02/26/2021 1446 Last data filed at 02/26/2021 1351 Gross per 24 hour  Intake 1040 ml  Output --  Net 1040 ml   Filed Weights   02/21/21 1238  Weight: 79.8 kg    Examination:  General exam: Appears comfortable, not in any acute distress.  Deconditioned. Respiratory system: Clear to auscultation. Respiratory effort normal.  RR 15 Cardiovascular system: S1-S2 heard, regular rate and rhythm, no murmur. Gastrointestinal system: Abdomen is soft, nontender, nondistended, BS+ Central nervous system: Alert and oriented x 3 . No focal neurological deficits. Extremities: No edema, no cyanosis, no clubbing. Skin: No rashes, lesions or ulcers Psychiatry: Judgement and insight appear normal. Mood & affect appropriate.     Data Reviewed: I have personally reviewed following labs and imaging studies  CBC: Recent Labs  Lab 02/21/21 1309 02/22/21 0502 02/24/21 1411 02/25/21 1817  WBC 15.4* 11.4* 7.2 13.0*  NEUTROABS 13.5*  --  4.8 10.4*  HGB 14.9 13.5 14.8 15.1  HCT 45.1 41.7 45.4 44.7  MCV 91.5 93.7 92.7 91.0  PLT 235 208 204 283   Basic Metabolic Panel: Recent Labs  Lab 02/21/21 1309 02/22/21 0502 02/24/21 1411  NA 138 136 137  K 4.2 3.9 4.2  CL 103 105 98  CO2 23 25 27   GLUCOSE 116* 90 135*  BUN 24* 31* 23  CREATININE 1.02 1.04 0.96  CALCIUM 10.4* 8.6* 9.4   GFR: Estimated Creatinine Clearance: 76.7 mL/min (by C-G formula based on SCr of 0.96 mg/dL). Liver Function Tests: Recent Labs  Lab 02/24/21 1411  AST 24  ALT 25  ALKPHOS 84  BILITOT 0.6  PROT 7.8  ALBUMIN  3.8   No results for input(s): LIPASE, AMYLASE in the last 168 hours. No results for input(s): AMMONIA in the last 168 hours. Coagulation Profile: Recent Labs  Lab 02/25/21 1817  INR 0.9   Cardiac Enzymes: No results for input(s): CKTOTAL, CKMB, CKMBINDEX, TROPONINI in the last 168 hours. BNP (last 3 results) No results for input(s): PROBNP in the last 8760 hours. HbA1C: No results for input(s): HGBA1C in the last 72 hours. CBG: No results for input(s): GLUCAP in the last 168 hours. Lipid Profile: No results for input(s): CHOL, HDL, LDLCALC, TRIG, CHOLHDL, LDLDIRECT in the last 72 hours. Thyroid Function Tests: No results for input(s): TSH, T4TOTAL, FREET4, T3FREE, THYROIDAB in the last 72 hours. Anemia Panel: No results for input(s): VITAMINB12, FOLATE, FERRITIN, TIBC, IRON, RETICCTPCT in the last 72 hours. Sepsis Labs: No results for input(s): PROCALCITON, LATICACIDVEN in the last 168 hours.  Recent Results (  from the past 240 hour(s))  Respiratory (~20 pathogens) panel by PCR     Status: None   Collection Time: 02/21/21  3:49 PM   Specimen: Peripheral; Respiratory  Result Value Ref Range Status   Adenovirus NOT DETECTED NOT DETECTED Final   Coronavirus 229E NOT DETECTED NOT DETECTED Final    Comment: (NOTE) The Coronavirus on the Respiratory Panel, DOES NOT test for the novel  Coronavirus (2019 nCoV)    Coronavirus HKU1 NOT DETECTED NOT DETECTED Final   Coronavirus NL63 NOT DETECTED NOT DETECTED Final   Coronavirus OC43 NOT DETECTED NOT DETECTED Final   Metapneumovirus NOT DETECTED NOT DETECTED Final   Rhinovirus / Enterovirus NOT DETECTED NOT DETECTED Final   Influenza A NOT DETECTED NOT DETECTED Final   Influenza B NOT DETECTED NOT DETECTED Final   Parainfluenza Virus 1 NOT DETECTED NOT DETECTED Final   Parainfluenza Virus 2 NOT DETECTED NOT DETECTED Final   Parainfluenza Virus 3 NOT DETECTED NOT DETECTED Final   Parainfluenza Virus 4 NOT DETECTED NOT DETECTED  Final   Respiratory Syncytial Virus NOT DETECTED NOT DETECTED Final   Bordetella pertussis NOT DETECTED NOT DETECTED Final   Bordetella Parapertussis NOT DETECTED NOT DETECTED Final   Chlamydophila pneumoniae NOT DETECTED NOT DETECTED Final   Mycoplasma pneumoniae NOT DETECTED NOT DETECTED Final    Comment: Performed at [facility], [place]. [address], [place], [place] [address]  Blood culture (routine x 2)     Status: None   Collection Time: 02/21/21  3:49 PM   Specimen: BLOOD  Result Value Ref Range Status   Specimen Description   Final    BLOOD BLOOD RIGHT ARM Performed at [facility], [address], [place], [place] [address]    Special Requests   Final    Blood Culture adequate volume BOTTLES DRAWN AEROBIC AND ANAEROBIC Performed at [facility], [address], [place], [place] [address]    Culture   Final    NO GROWTH 5 DAYS Performed at [facility], [place] [address] [place], [place] [address]    Report Status 02/26/2021 FINAL  Final  Blood culture (routine x 2)     Status: None   Collection Time: 02/21/21  3:49 PM   Specimen: BLOOD  Result Value Ref Range Status   Specimen Description   Final    BLOOD BLOOD LEFT ARM Performed at [facility], [address], [place], [place] [address]    Special Requests   Final    BOTTLES DRAWN AEROBIC AND ANAEROBIC Blood Culture adequate volume Performed at [facility], [address], [place], [place] [address]    Culture   Final    NO GROWTH 5 DAYS Performed at [facility], [place] [address], [place], [place] [address]    Report Status 02/26/2021 FINAL  Final  Expectorated Sputum Assessment w Gram Stain, Rflx to Resp Cult     Status: None   Collection Time: 02/21/21 11:30 PM   Specimen: Expectorated Sputum  Result Value Ref Range Status   Specimen Description EXPECTORATED SPUTUM  Final   Special Requests NONE  Final   Sputum  evaluation   Final    THIS SPECIMEN IS ACCEPTABLE FOR SPUTUM CULTURE Performed at [facility], [place] [address], [place], [place] [address]    Report Status 02/22/2021 FINAL  Final  Culture, Respiratory w Gram Stain     Status: None (Preliminary result)   Collection Time: 02/21/21  11:30 PM  Result Value Ref Range Status   Specimen Description   Final    EXPECTORATED SPUTUM Performed at [place] [address], [place], [place] [address]    Special Requests   Final    NONE Reflexed from [id number] Performed at [facility], [place] [address], [place], [place] [address]    Gram Stain   Final    NO SQUAMOUS EPITHELIAL CELLS SEEN FEW WBC SEEN RARE GRAM NEGATIVE RODS FEW GRAM POSITIVE COCCI    Culture   Final    FEW PSEUDOMONAS AERUGINOSA Sent to [place] for further susceptibility testing. Performed at [facility], [place] [address], [place], [place] [address]    Report Status PENDING  Incomplete    Radiology Studies: CT CHEST WO CONTRAST  Result Date: 02/25/2021 CLINICAL DATA:  Hemoptysis.  Shortness of breath. EXAM: CT CHEST WITHOUT CONTRAST TECHNIQUE: Multidetector CT imaging of the chest was performed following the standard protocol without IV contrast. COMPARISON:  CT chest 03/31/2016 FINDINGS: Cardiovascular: Normal heart size. No significant pericardial effusion. The thoracic aorta is normal in caliber. Mild atherosclerotic plaque of the thoracic aorta. At least 3 vessel coronary artery calcifications. Mediastinum/Nodes: No gross hilar adenopathy, noting limited sensitivity for the detection of hilar adenopathy on this noncontrast study. No enlarged mediastinal or axillary lymph nodes. Thyroid gland, trachea, and esophagus demonstrate no significant findings. Small hiatal hernia. Lungs/Pleura: Surgical changes related to a right middle and lower lobectomy again noted. Interval development marked cystic and varicoid  bronchiectasis in the right upper lobe with largest caliber 5.1 cm. Superimposed increased right upper lobe consolidation. Interval development of diffuse varicoid left lung bronchiectasis. 4 mm left lower lobe pulmonary nodule. Couple punctate pulmonary nodules within the left lower lobe. The other scattered micronodules within right upper lobe. No pneumothorax. No pleural effusion. Upper Abdomen: 2 mm calcification within the right kidney. No acute abnormality. Musculoskeletal: No chest wall abnormality. No suspicious lytic or blastic osseous lesions. No acute displaced fracture. Multilevel degenerative changes of the spine. IMPRESSION: 1. Interval development of varicoid bronchiectasis in the right upper lobe measuring up to 5 cm. Superimposed increased right upper lobe consolidation that could represent a combination of infection/inflammation with markedly limited evaluation on this noncontrast study. 2. Interval development of diffuse varicoid left lung bronchiectasis. 3. Several nonspecific scattered pulmonary micronodules within the bilateral lungs. 4. Status post right middle and lower lobe lobectomy. Electronically Signed   By: Iven Finn M.D.   On: 02/25/2021 19:04     Scheduled Meds:  albuterol  3 mL Inhalation BID   atorvastatin  10 mg Oral Daily   azithromycin  250 mg Oral Daily   calcium carbonate  1,250 mg Oral BID WC   cholecalciferol  2,000 Units Oral Daily   dextromethorphan  30 mg Oral BID   diltiazem  120 mg Oral Daily   dornase alfa  2.5 mg Nebulization Daily   doxylamine (Sleep)  25 mg Oral QHS   feeding supplement  237 mL Oral BID BM   ferrous sulfate  325 mg Oral BID   HYDROcodone-acetaminophen  1 tablet Oral TID   mometasone-formoterol  2 puff Inhalation BID   montelukast  10 mg Oral Daily   pantoprazole  40 mg Oral Daily   tobramycin (PF)  300 mg Nebulization BID   umeclidinium bromide  1 puff Inhalation Daily   vitamin C  250 mg Oral Daily   Continuous  Infusions:  meropenem (MERREM) IV 1  g (02/26/21 1351)     LOS: 4 days    Time spent: 35 mins    Sallyanne Birkhead, MD [facility]   If 7PM-7AM, please contact night-coverage

## 2021-02-26 NOTE — Plan of Care (Signed)
  Problem: Education: Goal: Knowledge of General Education information will improve Description: Including pain rating scale, medication(s)/side effects and non-pharmacologic comfort measures 02/26/2021 1107 by Sanjuana Mae, RN Outcome: Progressing 02/26/2021 1107 by Sanjuana Mae, RN Outcome: Not Progressing

## 2021-02-26 NOTE — TOC Progression Note (Signed)
Transition of Care [facility]) - Progression Note    Patient Details  Name: [name] MRN: [id number] Date of Birth: [birth year]  Transition of Care [facility]) CM/SW Contact  Purcell Mouton, RN Phone Number: 02/26/2021, 2:17 PM  Clinical Narrative:    Pt from home with spouse. TOC will continue to follow.   Expected Discharge Plan: Home/Self Care Barriers to Discharge: No Barriers Identified  Expected Discharge Plan and Services Expected Discharge Plan: Home/Self Care       Living arrangements for the past 2 months: Single Family Home                                       Social Determinants of Health (SDOH) Interventions    Readmission Risk Interventions No flowsheet data found.

## 2021-02-26 NOTE — Progress Notes (Signed)
NAME:  [name], MRN:  [id number], DOB:  08/14/1955, LOS: 4 ADMISSION DATE:  02/21/2021, CONSULTATION DATE:  12/6 REFERRING MD:  Posey Pronto, CHIEF COMPLAINT:  dyspnea, hemoptysis   History of Present Illness:  65 y/o male with a complex history of bronchiectasis with chronic colonization of pseudomonas presented with worsening dyspnea despite treatment with cipro as an outpatient.  He developed hemoptysis 5 days into therapy and pulmonary was consulted.   Pertinent  Medical History  Bronchiectasis S/p RML and RLL lobectomy in years past due to pulmonary infections Kidney stones Hypertension OSA GERD PONV Prostate cancer  Significant Hospital Events: Including procedures, antibiotic start and stop dates in addition to other pertinent events   12/2 admit to [facility], started on ceftaz 12/6 worsening hemoptysis, given TXA neb 12/6 CT chest > RUL with severe bronchiectasis, cavitary changes; diffuse bronchiectasis bilaterally, s/p RML and RLL lobectomy; scattered nodules  Micro 12/2 sputum culture> pseudomonas  Interim History / Subjective:  No hemoptysis overnight Slept well Feels OK this morning  Objective   Blood pressure (!) 100/59, pulse 72, temperature 98.3 F (36.8 C), resp. rate 18, height 5\' 7"  (1.702 m), weight 79.8 kg, SpO2 98 %.        Intake/Output Summary (Last 24 hours) at 02/26/2021 1004 Last data filed at 02/26/2021 0900 Gross per 24 hour  Intake 700 ml  Output --  Net 700 ml   Filed Weights   02/21/21 1238  Weight: 79.8 kg    Examination:  General:  Sitting up on side of in bed HENT: NCAT OP clear PULM: bronchial breath sounds on right, clear on left, normal effort CV: RRR, no mgr GI: BS+, soft, nontender MSK: normal bulk and tone Neuro: awake, alert, no distress, MAEW   Resolved Hospital Problem list     Assessment & Plan:  Acute exacerbation of bronchiectasis due to pseudomonas Chronic pseudomonas colonization Hemoptysis> improved  12/7 Severe destruction of right upper lobe due to chronic infection  Discussion: Hemoptysis improved after holding hypertonic saline neb, giving TXA neb last night and changing antibiotics to meropenem.    Plan No plans for bronchoscopy However if hemoptysis worsens can re-consider Follow up sputum culture from 12/2, sensitivities Continue meropenem for now Continue tobi neb Continue azithromycin Would re-introduce hypertonic saline on 12/9 if no hemoptysis Continue therapy vest Continue pulmozyme Continue dulera Needs follow up in [facility] bronchiectasis clinic Push nutrition, protein shakes, etc Mobility efforts  Will follow  Best Practice (right click and "Reselect all SmartList Selections" daily)   Per [facility]   Labs   CBC: Recent Labs  Lab 02/21/21 1309 02/22/21 0502 02/24/21 1411 02/25/21 1817  WBC 15.4* 11.4* 7.2 13.0*  NEUTROABS 13.5*  --  4.8 10.4*  HGB 14.9 13.5 14.8 15.1  HCT 45.1 41.7 45.4 44.7  MCV 91.5 93.7 92.7 91.0  PLT 235 208 204 591    Basic Metabolic Panel: Recent Labs  Lab 02/21/21 1309 02/22/21 0502 02/24/21 1411  NA 138 136 137  K 4.2 3.9 4.2  CL 103 105 98  CO2 23 25 27   GLUCOSE 116* 90 135*  BUN 24* 31* 23  CREATININE 1.02 1.04 0.96  CALCIUM 10.4* 8.6* 9.4   GFR: Estimated Creatinine Clearance: 76.7 mL/min (by C-G formula based on SCr of 0.96 mg/dL). Recent Labs  Lab 02/21/21 1309 02/22/21 0502 02/24/21 1411 02/25/21 1817  WBC 15.4* 11.4* 7.2 13.0*    Liver Function Tests: Recent Labs  Lab 02/24/21 1411  AST 24  ALT  25  ALKPHOS 84  BILITOT 0.6  PROT 7.8  ALBUMIN 3.8   No results for input(s): LIPASE, AMYLASE in the last 168 hours. No results for input(s): AMMONIA in the last 168 hours.  ABG    Component Value Date/Time   PHART 7.377 12/06/2010 0508   PCO2ART 47.4 (H) 12/06/2010 0508   PO2ART 85.0 12/06/2010 0508   HCO3 24.2 05/23/2018 1545   TCO2 27 12/06/2010 2022   ACIDBASEDEF 0.2 05/23/2018 1545   O2SAT  77.9 05/23/2018 1545     Coagulation Profile: Recent Labs  Lab 02/25/21 1817  INR 0.9    Cardiac Enzymes: No results for input(s): CKTOTAL, CKMB, CKMBINDEX, TROPONINI in the last 168 hours.  HbA1C: No results found for: HGBA1C  CBG: No results for input(s): GLUCAP in the last 168 hours. Critical care time: n/a     Roselie Awkward, MD [place] [facility] Pager: [phone] Cell: [phone] After 7:00 pm call Elink  [phone]

## 2021-02-27 LAB — MAGNESIUM: Magnesium: 2 mg/dL (ref 1.7–2.4)

## 2021-02-27 LAB — BASIC METABOLIC PANEL
Anion gap: 7 (ref 5–15)
BUN: 31 mg/dL — ABNORMAL HIGH (ref 8–23)
CO2: 29 mmol/L (ref 22–32)
Calcium: 8.7 mg/dL — ABNORMAL LOW (ref 8.9–10.3)
Chloride: 100 mmol/L (ref 98–111)
Creatinine, Ser: 0.93 mg/dL (ref 0.61–1.24)
GFR, Estimated: >60 mL/min (ref >60)
Glucose, Bld: 101 mg/dL — ABNORMAL HIGH (ref 70–99)
Potassium: 3.8 mmol/L (ref 3.5–5.1)
Sodium: 136 mmol/L (ref 135–145)

## 2021-02-27 LAB — CBC
HCT: 43.1 % (ref 39.0–52.0)
Hemoglobin: 13.9 g/dL (ref 13.0–17.0)
MCH: 30.7 pg (ref 26.0–34.0)
MCHC: 32.3 g/dL (ref 30.0–36.0)
MCV: 95.1 fL (ref 80.0–100.0)
Platelets: 160 10*3/uL (ref 150–400)
RBC: 4.53 MIL/uL (ref 4.22–5.81)
RDW: 11.9 % (ref 11.5–15.5)
WBC: 7.3 10*3/uL (ref 4.0–10.5)
nRBC: 0 % (ref 0.0–0.2)

## 2021-02-27 LAB — PHOSPHORUS: Phosphorus: 2.9 mg/dL (ref 2.5–4.6)

## 2021-02-27 LAB — ACID FAST SMEAR (AFB, MYCOBACTERIA): Acid Fast Smear: NEGATIVE

## 2021-02-27 NOTE — Progress Notes (Signed)
NAME:  [name], MRN:  [id number], DOB:  [birth year], LOS: 5 ADMISSION DATE:  02/21/2021, CONSULTATION DATE:  12/6 REFERRING MD:  Posey Pronto, CHIEF COMPLAINT:  dyspnea, hemoptysis   History of Present Illness:  65 y/o male with a complex history of bronchiectasis with chronic colonization of pseudomonas presented with worsening dyspnea despite treatment with cipro as an outpatient.  He developed hemoptysis 5 days into therapy and pulmonary was consulted.   Pertinent  Medical History  Bronchiectasis S/p RML and RLL lobectomy in years past due to pulmonary infections Kidney stones Hypertension OSA GERD PONV Prostate cancer  Significant Hospital Events: Including procedures, antibiotic start and stop dates in addition to other pertinent events   12/2 admit to [facility], started on ceftaz 12/6 worsening hemoptysis, given TXA neb 12/6 CT chest > RUL with severe bronchiectasis, cavitary changes; diffuse bronchiectasis bilaterally, s/p RML and RLL lobectomy; scattered nodules  Micro 12/2 sputum culture> pseudomonas 12/8 fungus culture >  12/8 fugitell >  12/8 aspergillus Ag (galactomannan) >   Abx 12/2 ceftax > 12/6 12/6 meropenem >   Interim History / Subjective:   Had wine colored mucus yesterday Some bloody mucus this morning Breathing improved  Objective   Blood pressure 112/70, pulse 89, temperature 98.6 F (37 C), temperature source Oral, resp. rate 18, height 5\' 7"  (1.702 m), weight 79.8 kg, SpO2 92 %.        Intake/Output Summary (Last 24 hours) at 02/27/2021 1021 Last data filed at 02/27/2021 0900 Gross per 24 hour  Intake 1040 ml  Output --  Net 1040 ml   Filed Weights   02/21/21 1238  Weight: 79.8 kg    Examination:  General:  Resting comfortably in chair HENT: NCAT OP clear PULM: bronchial breath sounds on R, crackles on left, normal effort CV: RRR, no mgr GI: BS+, soft, nontender MSK: normal bulk and tone Neuro: awake, alert, no distress,  MAEW    Resolved Hospital Problem list     Assessment & Plan:  Acute exacerbation of bronchiectasis due to pseudomonas Chronic pseudomonas colonization Hemoptysis> recurrent again 12/8, non-massive Severe destruction of right upper lobe due to chronic infection  Discussion: Hemoptysis has picked up again despite holding hypertonic saline.  I think this is due to lung damage in his RUL from chronic infection, he may have a fungal infection there.  I also worry that his pseudomonas may be resistant to ceftaz.  As this is a new problem for him he needs to be treated in hospital until the hemoptysis goes a away or improves.  He is at risk for massive, life threatening hemoptysis until we see the bleeding improve.  Plan F/u sputum culture sensitivites Check for fungal infection: fungitell, galactomannan, fungus culture Continue meropenem Once sensitivities are back narrow antibiotics and insert PICC with plans to complete 14 day course If still coughing up blood by 12/12 would perform bronchoscopy and plan for IR guided embolization Continue tobi neb, azithro, therapy vest, pulmozym, dulera, protein shakes and mobility effort   Will follow  Wife updated bedside  Best Practice (right click and "Reselect all SmartList Selections" daily)   Per TR[facility]   Labs   CBC: Recent Labs  Lab 02/21/21 1309 02/22/21 0502 02/24/21 1411 02/25/21 1817 02/27/21 0419  WBC 15.4* 11.4* 7.2 13.0* 7.3  NEUTROABS 13.5*  --  4.8 10.4*  --   HGB 14.9 13.5 14.8 15.1 13.9  HCT 45.1 41.7 45.4 44.7 43.1  MCV 91.5 93.7 92.7 91.0 95.1  PLT  235 208 204 201 195    Basic Metabolic Panel: Recent Labs  Lab 02/21/21 1309 02/22/21 0502 02/24/21 1411 02/27/21 0419  NA 138 136 137 136  K 4.2 3.9 4.2 3.8  CL 103 105 98 100  CO2 23 25 27 29   GLUCOSE 116* 90 135* 101*  BUN 24* 31* 23 31*  CREATININE 1.02 1.04 0.96 0.93  CALCIUM 10.4* 8.6* 9.4 8.7*  MG  --   --   --  2.0  PHOS  --   --   --  2.9    GFR: Estimated Creatinine Clearance: 79.1 mL/min (by C-G formula based on SCr of 0.93 mg/dL). Recent Labs  Lab 02/22/21 0502 02/24/21 1411 02/25/21 1817 02/27/21 0419  WBC 11.4* 7.2 13.0* 7.3    Liver Function Tests: Recent Labs  Lab 02/24/21 1411  AST 24  ALT 25  ALKPHOS 84  BILITOT 0.6  PROT 7.8  ALBUMIN 3.8   No results for input(s): LIPASE, AMYLASE in the last 168 hours. No results for input(s): AMMONIA in the last 168 hours.  ABG    Component Value Date/Time   PHART 7.377 12/06/2010 0508   PCO2ART 47.4 (H) 12/06/2010 0508   PO2ART 85.0 12/06/2010 0508   HCO3 24.2 05/23/2018 1545   TCO2 27 12/06/2010 2022   ACIDBASEDEF 0.2 05/23/2018 1545   O2SAT 77.9 05/23/2018 1545     Coagulation Profile: Recent Labs  Lab 02/25/21 1817  INR 0.9    Cardiac Enzymes: No results for input(s): CKTOTAL, CKMB, CKMBINDEX, TROPONINI in the last 168 hours.  HbA1C: No results found for: HGBA1C  CBG: No results for input(s): GLUCAP in the last 168 hours. Critical care time: n/a     Roselie Awkward, MD [place] [facility] Pager: [phone] Cell: [phone] After 7:00 pm call Elink  [phone]

## 2021-02-27 NOTE — Progress Notes (Signed)
Pt had small sputum amount that was a diluted red color and thinner in consistency than previous days.

## 2021-02-27 NOTE — Plan of Care (Signed)
  Problem: Health Behavior/Discharge Planning: Goal: Ability to manage health-related needs will improve Outcome: Progressing   

## 2021-02-27 NOTE — Progress Notes (Signed)
PROGRESS NOTE    [name]  SHF:[id number] DOB: 1956/01/30 DOA: 02/21/2021 PCP: Aura Dials, MD    Brief Narrative:  This 65 years old male with PMH significant for bronchiectasis with chronic colonization of Pseudomonas, kidney stones, hypertension, OSA, GERD, prostate cancer, s/p right middle and right lower lobe lobectomy due to pulmonary infections presented with worsening shortness of breath despite treatment with ciprofloxacin as an outpatient. He developed hemoptysis 5 days while being on antibiotics.  Pulmonology is consulted,  hemoptysis is improving with some adjustment in medications there is no plan for bronchoscopy at this time.  Assessment & Plan:   Principal Problem:   Bronchiectasis with acute exacerbation ([place]) Active Problems:   Malignant neoplasm of prostate (HCC)   History of PSVT (paroxysmal supraventricular tachycardia)   HLD (hyperlipidemia)   Chronic pain   Cough with hemoptysis   Failure to thrive in adult  Bronchiectasis with acute exacerbation: Patient has chronic bronchiectasis with chronic Pseudomonas colonization. Patient has been following up with Dr. Olena Heckle at [facility]( primary pulmonologist). Patient at baseline uses 2 L of supplemental oxygen at night and on exertion. Pulmonology recommended to continue IV Fortaz, tobramycin nebs and continue saline nebulization, albuterol, Spiriva, Symbicort and Pulmozyme. Sputum culture growing Pseudomonas, sensitivities pending. AFB cultures also sent results pending.   Hemoptysis: > Improving Patient initially had some streaking of blood with mucus. The patient has worsening blood in the sputum. Pulmonology consulted. Medications adjusted.   Patient has received tranexamic acid nebulizer therapy IV antibiotic changed from [place] to meropenem. He needs to be treated in hospital until hemoptysis resolves. He is at risk for massive, life-threatening hemoptysis. Continue meropenem, total 14-day  course. Obtain Fungitell galactomannan and fungus culture. If he still has hemoptysis by 12/12 pulmonology considers bronchoscopy and plan for IR guided embolization.  Chronic pain: Continue Norco  History of paroxysmal SVT: Continue Cardizem  Hyperlipidemia: Continue statin  Prior history of prostate cancer: Stable.  Failure to thrive in adult: Secondary to ongoing recurrent infection,  bronchiectasis at baseline. Continue regular diet dietary consult.   DVT prophylaxis: SCDs Code Status: Full code. Family Communication: Family at bedside Disposition Plan:  Status is: Inpatient  Remains inpatient appropriate because: Hemoptysis requiring pulmonology evaluation may require bronchoscopy.  Consultants:  Pulmonology  Procedures:   Antimicrobials:  Anti-infectives (From admission, onward)    Start     Dose/Rate Route Frequency Ordered Stop   02/25/21 1915  azithromycin (ZITHROMAX) tablet 250 mg        250 mg Oral Daily 02/25/21 1821     02/25/21 1830  meropenem (MERREM) 1 g in sodium chloride 0.9 % 100 mL IVPB        1 g 200 mL/hr over 30 Minutes Intravenous Every 8 hours 02/25/21 1823     02/22/21 0000  cefTAZidime (FORTAZ) 2 g in sodium chloride 0.9 % 100 mL IVPB  Status:  Discontinued        2 g 200 mL/hr over 30 Minutes Intravenous Every 8 hours 02/21/21 2048 02/21/21 2050   02/22/21 0000  cefTAZidime (FORTAZ) 1 g in sodium chloride 0.9 % 100 mL IVPB  Status:  Discontinued        1 g 200 mL/hr over 30 Minutes Intravenous Every 8 hours 02/21/21 2050 02/25/21 1818   02/21/21 2230  tobramycin (PF) ([name]) nebulizer solution 60 mg  Status:  Discontinued        60 mg Nebulization 2 times daily 02/21/21 2123 02/21/21 2214   02/21/21 2230  tobramycin (PF) ([name]) nebulizer solution 300 mg        300 mg Nebulization 2 times daily 02/21/21 2214     02/21/21 1515  cefTAZidime (FORTAZ) 2 g in sodium chloride 0.9 % 100 mL IVPB  Status:  Discontinued        2 g 200 mL/hr over  30 Minutes Intravenous Every 8 hours 02/21/21 1513 02/21/21 2047        Subjective: Patient was seen and examined at bedside.  Overnight events noted.   Patient denies any chest pain and shortness of breath.  He still reports having blood in the sputum. Patient is sitting comfortably on the chair.  Objective: Vitals:   02/27/21 0425 02/27/21 1001 02/27/21 1036 02/27/21 1300  BP: 112/79 112/70    Pulse: 79 89    Resp: 19 18    Temp: 98.4 F (36.9 C) 98.6 F (37 C)    TempSrc:  Oral    SpO2: 91% 92% 93% 93%  Weight:      Height:        Intake/Output Summary (Last 24 hours) at 02/27/2021 1440 Last data filed at 02/27/2021 1300 Gross per 24 hour  Intake 1060 ml  Output --  Net 1060 ml   Filed Weights   02/21/21 1238  Weight: 79.8 kg    Examination:  General exam: Appears deconditioned, not in any acute distress.  Chronically ill looking Respiratory system: Breath sounds: Bronchial on right side,   RR 15 Cardiovascular system: S1-S2 heard, regular rate and rhythm, no murmur. Gastrointestinal system: Abdomen is soft, nontender, nondistended, BS+ Central nervous system: Alert and oriented x 3 . No focal neurological deficits. Extremities: No edema, no cyanosis, no clubbing. Skin: No rashes, lesions or ulcers Psychiatry: Judgement and insight appear normal. Mood & affect appropriate.     Data Reviewed: I have personally reviewed following labs and imaging studies  CBC: Recent Labs  Lab 02/21/21 1309 02/22/21 0502 02/24/21 1411 02/25/21 1817 02/27/21 0419  WBC 15.4* 11.4* 7.2 13.0* 7.3  NEUTROABS 13.5*  --  4.8 10.4*  --   HGB 14.9 13.5 14.8 15.1 13.9  HCT 45.1 41.7 45.4 44.7 43.1  MCV 91.5 93.7 92.7 91.0 95.1  PLT 235 208 204 201 034   Basic Metabolic Panel: Recent Labs  Lab 02/21/21 1309 02/22/21 0502 02/24/21 1411 02/27/21 0419  NA 138 136 137 136  K 4.2 3.9 4.2 3.8  CL 103 105 98 100  CO2 23 25 27 29   GLUCOSE 116* 90 135* 101*  BUN 24* 31* 23  31*  CREATININE 1.02 1.04 0.96 0.93  CALCIUM 10.4* 8.6* 9.4 8.7*  MG  --   --   --  2.0  PHOS  --   --   --  2.9   GFR: Estimated Creatinine Clearance: 79.1 mL/min (by C-G formula based on SCr of 0.93 mg/dL). Liver Function Tests: Recent Labs  Lab 02/24/21 1411  AST 24  ALT 25  ALKPHOS 84  BILITOT 0.6  PROT 7.8  ALBUMIN 3.8   No results for input(s): LIPASE, AMYLASE in the last 168 hours. No results for input(s): AMMONIA in the last 168 hours. Coagulation Profile: Recent Labs  Lab 02/25/21 1817  INR 0.9   Cardiac Enzymes: No results for input(s): CKTOTAL, CKMB, CKMBINDEX, TROPONINI in the last 168 hours. BNP (last 3 results) No results for input(s): PROBNP in the last 8760 hours. HbA1C: No results for input(s): HGBA1C in the last 72 hours. CBG: No results for  input(s): GLUCAP in the last 168 hours. Lipid Profile: No results for input(s): CHOL, HDL, LDLCALC, TRIG, CHOLHDL, LDLDIRECT in the last 72 hours. Thyroid Function Tests: No results for input(s): TSH, T4TOTAL, FREET4, T3FREE, THYROIDAB in the last 72 hours. Anemia Panel: No results for input(s): VITAMINB12, FOLATE, FERRITIN, TIBC, IRON, RETICCTPCT in the last 72 hours. Sepsis Labs: No results for input(s): PROCALCITON, LATICACIDVEN in the last 168 hours.  Recent Results (from the past 240 hour(s))  Respiratory (~20 pathogens) panel by PCR     Status: None   Collection Time: 02/21/21  3:49 PM   Specimen: Peripheral; Respiratory  Result Value Ref Range Status   Adenovirus NOT DETECTED NOT DETECTED Final   Coronavirus 229E NOT DETECTED NOT DETECTED Final    Comment: (NOTE) The Coronavirus on the Respiratory Panel, DOES NOT test for the novel  Coronavirus (2019 nCoV)    Coronavirus HKU1 NOT DETECTED NOT DETECTED Final   Coronavirus NL63 NOT DETECTED NOT DETECTED Final   Coronavirus OC43 NOT DETECTED NOT DETECTED Final   Metapneumovirus NOT DETECTED NOT DETECTED Final   Rhinovirus / Enterovirus NOT DETECTED  NOT DETECTED Final   Influenza A NOT DETECTED NOT DETECTED Final   Influenza B NOT DETECTED NOT DETECTED Final   Parainfluenza Virus 1 NOT DETECTED NOT DETECTED Final   Parainfluenza Virus 2 NOT DETECTED NOT DETECTED Final   Parainfluenza Virus 3 NOT DETECTED NOT DETECTED Final   Parainfluenza Virus 4 NOT DETECTED NOT DETECTED Final   Respiratory Syncytial Virus NOT DETECTED NOT DETECTED Final   Bordetella pertussis NOT DETECTED NOT DETECTED Final   Bordetella Parapertussis NOT DETECTED NOT DETECTED Final   Chlamydophila pneumoniae NOT DETECTED NOT DETECTED Final   Mycoplasma pneumoniae NOT DETECTED NOT DETECTED Final    Comment: Performed at [facility], [address], [place], [place] [address]  Blood culture (routine x 2)     Status: None   Collection Time: 02/21/21  3:49 PM   Specimen: BLOOD  Result Value Ref Range Status   Specimen Description   Final    BLOOD BLOOD RIGHT ARM Performed at [facility], [address], [place], [place] [address]    Special Requests   Final    Blood Culture adequate volume BOTTLES DRAWN AEROBIC AND ANAEROBIC Performed at [facility], [address], [place], [place] [address]    Culture   Final    NO GROWTH 5 DAYS Performed at [facility], [place] [address], [place], [place] [address]    Report Status 02/26/2021 FINAL  Final  Blood culture (routine x 2)     Status: None   Collection Time: 02/21/21  3:49 PM   Specimen: BLOOD  Result Value Ref Range Status   Specimen Description   Final    BLOOD BLOOD LEFT ARM Performed at [facility], [address], [place], [place] [address]    Special Requests   Final    BOTTLES DRAWN AEROBIC AND ANAEROBIC Blood Culture adequate volume Performed at [facility], [address] [place], [place] [address]    Culture   Final    NO GROWTH 5 DAYS Performed at [facility], [place] [address] [place],  [place] [address]    Report Status 02/26/2021 FINAL  Final  Expectorated Sputum Assessment w Gram Stain, Rflx to Resp Cult     Status: None   Collection Time: 02/21/21 11:30 PM   Specimen: Expectorated Sputum  Result Value Ref Range  Status   Specimen Description EXPECTORATED SPUTUM  Final   Special Requests NONE  Final   Sputum evaluation   Final    THIS SPECIMEN IS ACCEPTABLE FOR SPUTUM CULTURE Performed at [facility], [place] [address] [place], [place] [address]    Report Status 02/22/2021 FINAL  Final  Culture, Respiratory w Gram Stain     Status: None (Preliminary result)   Collection Time: 02/21/21 11:30 PM  Result Value Ref Range Status   Specimen Description   Final    EXPECTORATED SPUTUM Performed at [place] [address] [place], [place] [address]    Special Requests   Final    NONE Reflexed from [id number] Performed at [facility], [place] [address] [place], [place] [address]    Gram Stain   Final    NO SQUAMOUS EPITHELIAL CELLS SEEN FEW WBC SEEN RARE GRAM NEGATIVE RODS FEW GRAM POSITIVE COCCI    Culture   Final    FEW PSEUDOMONAS AERUGINOSA Sent to [place] for further susceptibility testing. Performed at [facility], [place] [address], [place], [place] [address]    Report Status PENDING  Incomplete  Acid Fast Smear (AFB)     Status: None   Collection Time: 02/22/21  1:00 AM   Specimen: Expectorated Sputum  Result Value Ref Range Status   AFB Specimen Processing Concentration  Final   Acid Fast Smear Negative  Final    Comment: (NOTE) Performed At: [facility] [place], [place] [id number] Rush Farmer MD [id number]    Source (AFB) EXPECTORATED SPUTUM  Final    Comment: Performed at [facility], [place] [address], [place], [place] [address]  Susceptibility, Aer + Anaerob     Status: Abnormal   Collection Time: 02/22/21  5:59 AM  Result Value Ref Range Status    Suscept, Aer + Anaerob Preliminary report (A)  Final    Comment: (NOTE) Performed At: [facility] [place], [place] [id number] Rush Farmer MD [id number]    Source of Sample [phone]  Final    Comment: Performed at [facility], [place] [address], [place], [place] [address]  Susceptibility Result     Status: Abnormal   Collection Time: 02/22/21  5:59 AM  Result Value Ref Range Status   Suscept Result 1 Comment (A)  Final    Comment: (NOTE) Pseudomonas aeruginosa Identification performed by account, not confirmed by this laboratory. Performed At: [facility] [place], [place] [id number] Rush Farmer MD NI:[id number]     Radiology Studies: CT CHEST WO CONTRAST  Result Date: 02/25/2021 CLINICAL DATA:  Hemoptysis.  Shortness of breath. EXAM: CT CHEST WITHOUT CONTRAST TECHNIQUE: Multidetector CT imaging of the chest was performed following the standard protocol without IV contrast. COMPARISON:  CT chest 03/31/2016 FINDINGS: Cardiovascular: Normal heart size. No significant pericardial effusion. The thoracic aorta is normal in caliber. Mild atherosclerotic plaque of the thoracic aorta. At least 3 vessel coronary artery calcifications. Mediastinum/Nodes: No gross hilar adenopathy, noting limited sensitivity for the detection of hilar adenopathy on this noncontrast study. No enlarged mediastinal or axillary lymph nodes. Thyroid gland, trachea, and esophagus demonstrate no significant findings. Small hiatal hernia. Lungs/Pleura: Surgical changes related to a right middle and lower lobectomy again noted. Interval development marked cystic and varicoid bronchiectasis in the right upper lobe with largest caliber 5.1 cm. Superimposed increased right upper lobe consolidation. Interval development of diffuse varicoid left lung bronchiectasis.  4 mm left lower lobe pulmonary nodule. Couple punctate pulmonary nodules within the left lower lobe. The other  scattered micronodules within right upper lobe. No pneumothorax. No pleural effusion. Upper Abdomen: 2 mm calcification within the right kidney. No acute abnormality. Musculoskeletal: No chest wall abnormality. No suspicious lytic or blastic osseous lesions. No acute displaced fracture. Multilevel degenerative changes of the spine. IMPRESSION: 1. Interval development of varicoid bronchiectasis in the right upper lobe measuring up to 5 cm. Superimposed increased right upper lobe consolidation that could represent a combination of infection/inflammation with markedly limited evaluation on this noncontrast study. 2. Interval development of diffuse varicoid left lung bronchiectasis. 3. Several nonspecific scattered pulmonary micronodules within the bilateral lungs. 4. Status post right middle and lower lobe lobectomy. Electronically Signed   By: Iven Finn M.D.   On: 02/25/2021 19:04     Scheduled Meds:  albuterol  3 mL Inhalation BID   atorvastatin  10 mg Oral Daily   azithromycin  250 mg Oral Daily   calcium carbonate  1,250 mg Oral BID WC   cholecalciferol  2,000 Units Oral Daily   dextromethorphan  30 mg Oral BID   diltiazem  120 mg Oral Daily   dornase alfa  2.5 mg Nebulization Daily   doxylamine (Sleep)  25 mg Oral QHS   feeding supplement  237 mL Oral BID BM   ferrous sulfate  325 mg Oral BID   HYDROcodone-acetaminophen  1 tablet Oral TID   mometasone-formoterol  2 puff Inhalation BID   montelukast  10 mg Oral Daily   pantoprazole  40 mg Oral Daily   tobramycin (PF)  300 mg Nebulization BID   umeclidinium bromide  1 puff Inhalation Daily   vitamin C  250 mg Oral Daily   Continuous Infusions:  meropenem (MERREM) IV 1 g (02/27/21 0618)     LOS: 5 days    Time spent: 35 mins    Trenita Hulme, MD [facility]   If 7PM-7AM, please contact night-coverage

## 2021-02-28 MED ORDER — REVEFENACIN 175 MCG/3ML IN SOLN
175.0000 ug | Freq: Every day | RESPIRATORY_TRACT | Status: DC
  Administered 2021-03-01 – 2021-03-04 (×4): 175 ug via RESPIRATORY_TRACT
  Filled 2021-02-28 (×5): qty 3

## 2021-02-28 NOTE — Progress Notes (Signed)
Pharmacy Antibiotic Note  [name] is a 65 y.o. male admitted on 02/21/2021 with hemoptysis, chest congestion, cough, and fatigue.  Patient has a history of longstanding bronchiectasis and Pseudomonas colonization (follows up with [facility] pulm).  Pseudomonas in the past has been susceptible to ceftazidime, however with worsening hemoptysis CCM would like to broaden abx.  Pharmacy has been consulted for meropenem dosing.  Day #4 Meropenem Afebrile WBC improved SCr stable, CrCl ~80 ml/min  Plan: Continue Meropenem 1g IV q8 hours Azithromycin per MD F/u clinical improvement, CCM recs, culture data  Height: 5\' 7"  (170.2 cm) Weight: 79.8 kg (176 lb) IBW/kg (Calculated) : 66.1  Temp (24hrs), Avg:98.6 F (37 C), Min:98.6 F (37 C), Max:98.7 F (37.1 C)  Recent Labs  Lab 02/21/21 1309 02/22/21 0502 02/24/21 1411 02/25/21 1817 02/27/21 0419  WBC 15.4* 11.4* 7.2 13.0* 7.3  CREATININE 1.02 1.04 0.96  --  0.93     Estimated Creatinine Clearance: 79.1 mL/min (by C-G formula based on SCr of 0.93 mg/dL).    Allergies  Allergen Reactions   Morphine Nausea And Vomiting   Morphine And Related Nausea And Vomiting   Tobramycin Nausea And Vomiting and Other (See Comments)    Ototoxicity, when administered in IV  02/22/21 pt reports no allergy/intolerance to current tobramycin nebulizing soln   Gentamicin Other (See Comments)    BALANCE ISSUES    Antimicrobials this admission: 12/2 Ceftaz >> 12/6 12/2 [name] nebs >> 12/6 Azith 250mg  po qd >> 12/6 Meropenem >>  Dose adjustments this admission:   Microbiology results: 12/2 BCx: NGF 12/2 Respiratory panel: neg 12/2 Sputum: few Pseudomonas 12/8 Sputum: - AFB - Fungus - Aspergillus  Thank you for allowing pharmacy to be a part of this patient's care.  Peggyann Juba, PharmD, BCPS Pharmacy: [phone] 02/28/2021 8:26 AM

## 2021-02-28 NOTE — Progress Notes (Addendum)
NAME:  [name], MRN:  [id number], DOB:  Sep 02, 1955, LOS: 6 ADMISSION DATE:  02/21/2021, CONSULTATION DATE:  12/6 REFERRING MD:  Posey Pronto, CHIEF COMPLAINT:  dyspnea, hemoptysis   History of Present Illness:  65 y/o male with a complex history of bronchiectasis with chronic colonization of pseudomonas presented with worsening dyspnea despite treatment with cipro as an outpatient.  He developed hemoptysis 5 days into therapy and pulmonary was consulted.   Pertinent  Medical History  Bronchiectasis S/p RML and RLL lobectomy in years past due to pulmonary infections Kidney stones Hypertension OSA GERD PONV Prostate cancer  Significant Hospital Events: Including procedures, antibiotic start and stop dates in addition to other pertinent events   12/2 admit to [facility], started on ceftaz 12/6 worsening hemoptysis, given TXA neb 12/6 CT chest > RUL with severe bronchiectasis, cavitary changes; diffuse bronchiectasis bilaterally, s/p RML and RLL lobectomy; scattered nodules Changed incruse to yupelri 12/9   Micro 12/2 sputum culture> pseudomonas>>> 12/2 BC x 2 neg  12/3 AFB neg smear >>> 12/8 fungus culture >  12/8 fugitell >  12/8 aspergillus Ag (galactomannan) >   Abx Torbramycin neb 12/2 >>> 12/2 fortaz 12/6 12/6 meropenem >  12/6 zmax>>>   Scheduled Meds:  albuterol  3 mL Inhalation BID   atorvastatin  10 mg Oral Daily   azithromycin  250 mg Oral Daily   calcium carbonate  1,250 mg Oral BID WC   cholecalciferol  2,000 Units Oral Daily   dextromethorphan  30 mg Oral BID   diltiazem  120 mg Oral Daily   dornase alfa  2.5 mg Nebulization Daily   doxylamine (Sleep)  25 mg Oral QHS   feeding supplement  237 mL Oral BID BM   ferrous sulfate  325 mg Oral BID   HYDROcodone-acetaminophen  1 tablet Oral TID   mometasone-formoterol  2 puff Inhalation BID   montelukast  10 mg Oral Daily   pantoprazole  40 mg Oral Daily   tobramycin (PF)  300 mg Nebulization BID   umeclidinium  bromide  1 puff Inhalation Daily   vitamin C  250 mg Oral Daily   Continuous Infusions:  meropenem (MERREM) IV 1 g (02/28/21 0512)   PRN Meds:.chlorpheniramine-HYDROcodone    Interim History / Subjective:  Up in room, much less hemoptysis over last sev days     Objective   Blood pressure (!) 100/58, pulse 79, temperature 98.7 F (37.1 C), temperature source Oral, resp. rate 17, height 5\' 7"  (1.702 m), weight 79.8 kg, SpO2 93 %.        Intake/Output Summary (Last 24 hours) at 02/28/2021 1034 Last data filed at 02/28/2021 4403 Gross per 24 hour  Intake 800 ml  Output --  Net 800 ml   Filed Weights   02/21/21 1238  Weight: 79.8 kg    Examination: Tmax 99 General appearance:    up in room nad   At Rest 02 sats  93% on 2lpm   No jvd Oropharynx clear,  mucosa nl Neck supple Lungs with mild exp > insp rhonchi bilaterally RRR no s3 or or sign murmur Abd soft/ benign/ po intake ok Extr warm with no edema or clubbing noted Neuro  Sensorium intact,  no apparent motor deficits       Resolved Hospital Problem list     Assessment & Plan:  Acute exacerbation of bronchiectasis due to pseudomonas Chronic pseudomonas colonization Hemoptysis> recurrent again 12/8, non-massive, better since then Severe destruction of right upper lobe due  to chronic infection     Plan F/u sputum culture sensitivites Check for fungal infection: fungitell, galactomannan, fungus culture Continue meropenem Once sensitivities are back will be able  narrow antibiotics and insert PICC with plans to complete 14 day course If still coughing up blood by 12/12 would perform bronchoscopy and plan for IR guided embolization if really flares in meantime Continue tobi neb, azithro, therapy vest, pulmozym, dulera, protein shakes and mobility effort  I reviewed Dr Anastasia Pall plans and recs with pt at bedside - it does look like he may be turning the corner but too soon to call for sure.    >>>   He is on  a dpi LAMA which actually may aggravate the cough and would prefer Yupelri in this setting so ordered it today   Best Practice (right click and "Reselect all SmartList Selections" daily)   Per [facility]   Labs   CBC: Recent Labs  Lab 02/21/21 1309 02/22/21 0502 02/24/21 1411 02/25/21 1817 02/27/21 0419  WBC 15.4* 11.4* 7.2 13.0* 7.3  NEUTROABS 13.5*  --  4.8 10.4*  --   HGB 14.9 13.5 14.8 15.1 13.9  HCT 45.1 41.7 45.4 44.7 43.1  MCV 91.5 93.7 92.7 91.0 95.1  PLT 235 208 204 201 559    Basic Metabolic Panel: Recent Labs  Lab 02/21/21 1309 02/22/21 0502 02/24/21 1411 02/27/21 0419  NA 138 136 137 136  K 4.2 3.9 4.2 3.8  CL 103 105 98 100  CO2 23 25 27 29   GLUCOSE 116* 90 135* 101*  BUN 24* 31* 23 31*  CREATININE 1.02 1.04 0.96 0.93  CALCIUM 10.4* 8.6* 9.4 8.7*  MG  --   --   --  2.0  PHOS  --   --   --  2.9   GFR: Estimated Creatinine Clearance: 79.1 mL/min (by C-G formula based on SCr of 0.93 mg/dL). Recent Labs  Lab 02/22/21 0502 02/24/21 1411 02/25/21 1817 02/27/21 0419  WBC 11.4* 7.2 13.0* 7.3    Liver Function Tests: Recent Labs  Lab 02/24/21 1411  AST 24  ALT 25  ALKPHOS 84  BILITOT 0.6  PROT 7.8  ALBUMIN 3.8   No results for input(s): LIPASE, AMYLASE in the last 168 hours. No results for input(s): AMMONIA in the last 168 hours.  ABG    Component Value Date/Time   PHART 7.377 12/06/2010 0508   PCO2ART 47.4 (H) 12/06/2010 0508   PO2ART 85.0 12/06/2010 0508   HCO3 24.2 05/23/2018 1545   TCO2 27 12/06/2010 2022   ACIDBASEDEF 0.2 05/23/2018 1545   O2SAT 77.9 05/23/2018 1545     Coagulation Profile: Recent Labs  Lab 02/25/21 1817  INR 0.9    Cardiac Enzymes: No results for input(s): CKTOTAL, CKMB, CKMBINDEX, TROPONINI in the last 168 hours.  HbA1C: No results found for: HGBA1C  CBG: No results for input(s): GLUCAP in the last 168 hours.   Christinia Gully, MD Pulmonary and [place]  [phone]   After 7:00 pm call Elink  [phone]

## 2021-02-28 NOTE — Progress Notes (Signed)
PROGRESS NOTE    [name]  JQB:[id number] DOB: 01-27-1956 DOA: 02/21/2021 PCP: Aura Dials, MD    Brief Narrative:  This 65 years old male with PMH significant for bronchiectasis with chronic colonization of Pseudomonas, kidney stones, hypertension, OSA, GERD, prostate cancer, s/p right middle and right lower lobe lobectomy due to pulmonary infections presented with worsening shortness of breath despite treatment with ciprofloxacin as an outpatient. He developed hemoptysis 5 days while being on antibiotics.  Pulmonology is consulted,  hemoptysis is improving with some adjustment in medications there is no plan for bronchoscopy at this time.  Assessment & Plan:   Principal Problem:   Bronchiectasis with acute exacerbation ([place]) Active Problems:   Malignant neoplasm of prostate (HCC)   History of PSVT (paroxysmal supraventricular tachycardia)   HLD (hyperlipidemia)   Chronic pain   Cough with hemoptysis   Failure to thrive in adult  Bronchiectasis with acute exacerbation: Patient has chronic bronchiectasis with chronic Pseudomonas colonization. Patient has been following up with Dr. Olena Heckle at [facility]( primary pulmonologist). Patient at baseline uses 2 L of supplemental oxygen at night and on exertion. Pulmonology recommended to continue IV meropenum, tobramycin nebs and continue saline nebulization, albuterol, Spiriva, Symbicort and Pulmozyme. Sputum culture growing Pseudomonas, sensitivities pending. AFB cultures also sent results pending.   Hemoptysis: > Improving Patient initially had some streaking of blood with mucus. The patient has worsening blood in the sputum. Pulmonology consulted. Medications adjusted.   Patient has received tranexamic acid nebulizer therapy IV antibiotic changed from [place] to meropenem. He needs to be treated in hospital until hemoptysis resolves. He is at risk for massive, life-threatening hemoptysis. Continue meropenem, total 14-day  course. Obtain Fungitell galactomannan and fungus culture. If he still has hemoptysis by 12/12 pulmonology considers bronchoscopy and plan for IR guided embolization.  Chronic pain: Continue Norco  History of paroxysmal SVT: Continue Cardizem  Hyperlipidemia: Continue statin  Prior history of prostate cancer: Stable.  Failure to thrive in adult: Secondary to ongoing recurrent infection,  bronchiectasis at baseline. Continue regular diet dietary consult.   DVT prophylaxis: SCDs Code Status: Full code. Family Communication: Family at bedside Disposition Plan:  Status is: Inpatient  Remains inpatient appropriate because: Hemoptysis requiring pulmonology evaluation may require bronchoscopy.  Consultants:  Pulmonology  Procedures:   Antimicrobials:  Anti-infectives (From admission, onward)    Start     Dose/Rate Route Frequency Ordered Stop   02/25/21 1915  azithromycin (ZITHROMAX) tablet 250 mg        250 mg Oral Daily 02/25/21 1821     02/25/21 1830  meropenem (MERREM) 1 g in sodium chloride 0.9 % 100 mL IVPB        1 g 200 mL/hr over 30 Minutes Intravenous Every 8 hours 02/25/21 1823     02/22/21 0000  cefTAZidime (FORTAZ) 2 g in sodium chloride 0.9 % 100 mL IVPB  Status:  Discontinued        2 g 200 mL/hr over 30 Minutes Intravenous Every 8 hours 02/21/21 2048 02/21/21 2050   02/22/21 0000  cefTAZidime (FORTAZ) 1 g in sodium chloride 0.9 % 100 mL IVPB  Status:  Discontinued        1 g 200 mL/hr over 30 Minutes Intravenous Every 8 hours 02/21/21 2050 02/25/21 1818   02/21/21 2230  tobramycin (PF) ([name]) nebulizer solution 60 mg  Status:  Discontinued        60 mg Nebulization 2 times daily 02/21/21 2123 02/21/21 2214   02/21/21 2230  tobramycin (PF) ([name]) nebulizer solution 300 mg        300 mg Nebulization 2 times daily 02/21/21 2214     02/21/21 1515  cefTAZidime (FORTAZ) 2 g in sodium chloride 0.9 % 100 mL IVPB  Status:  Discontinued        2 g 200 mL/hr over  30 Minutes Intravenous Every 8 hours 02/21/21 1513 02/21/21 2047        Subjective: Patient was seen and examined at bedside.  Overnight events noted.   Patient denies any chest pain or shortness of breath.  He still reports coughing up blood small amount. Patient is sitting comfortably on the chair.  Objective: Vitals:   02/27/21 2202 02/28/21 0512 02/28/21 0815 02/28/21 1252  BP: 116/73 (!) 100/58  105/80  Pulse: 95 79  84  Resp: 18 17  16   Temp: 98.6 F (37 C) 98.7 F (37.1 C)  98.9 F (37.2 C)  TempSrc: Oral Oral  Oral  SpO2: 97% 97% 93% 94%  Weight:      Height:        Intake/Output Summary (Last 24 hours) at 02/28/2021 1324 Last data filed at 02/28/2021 0512 Gross per 24 hour  Intake 440 ml  Output --  Net 440 ml   Filed Weights   02/21/21 1238  Weight: 79.8 kg    Examination:  General exam: Appears deconditioned, not in any acute distress.  Sitting comfortably in the chair. Respiratory system: Bronchial breath sounds on right side,  left side clear to auscultation.  RR 15 Cardiovascular system: S1-S2 heard, regular rate and rhythm, no murmur. Gastrointestinal system: Abdomen is soft, nontender, nondistended, BS+ Central nervous system: Alert and oriented x 3 . No focal neurological deficits. Extremities: No edema, no cyanosis, no clubbing. Skin: No rashes, lesions or ulcers Psychiatry: Judgement and insight appear normal. Mood & affect appropriate.     Data Reviewed: I have personally reviewed following labs and imaging studies  CBC: Recent Labs  Lab 02/22/21 0502 02/24/21 1411 02/25/21 1817 02/27/21 0419  WBC 11.4* 7.2 13.0* 7.3  NEUTROABS  --  4.8 10.4*  --   HGB 13.5 14.8 15.1 13.9  HCT 41.7 45.4 44.7 43.1  MCV 93.7 92.7 91.0 95.1  PLT 208 204 201 623   Basic Metabolic Panel: Recent Labs  Lab 02/22/21 0502 02/24/21 1411 02/27/21 0419  NA 136 137 136  K 3.9 4.2 3.8  CL 105 98 100  CO2 25 27 29   GLUCOSE 90 135* 101*  BUN 31* 23 31*   CREATININE 1.04 0.96 0.93  CALCIUM 8.6* 9.4 8.7*  MG  --   --  2.0  PHOS  --   --  2.9   GFR: Estimated Creatinine Clearance: 79.1 mL/min (by C-G formula based on SCr of 0.93 mg/dL). Liver Function Tests: Recent Labs  Lab 02/24/21 1411  AST 24  ALT 25  ALKPHOS 84  BILITOT 0.6  PROT 7.8  ALBUMIN 3.8   No results for input(s): LIPASE, AMYLASE in the last 168 hours. No results for input(s): AMMONIA in the last 168 hours. Coagulation Profile: Recent Labs  Lab 02/25/21 1817  INR 0.9   Cardiac Enzymes: No results for input(s): CKTOTAL, CKMB, CKMBINDEX, TROPONINI in the last 168 hours. BNP (last 3 results) No results for input(s): PROBNP in the last 8760 hours. HbA1C: No results for input(s): HGBA1C in the last 72 hours. CBG: No results for input(s): GLUCAP in the last 168 hours. Lipid Profile: No results for input(s):  CHOL, HDL, LDLCALC, TRIG, CHOLHDL, LDLDIRECT in the last 72 hours. Thyroid Function Tests: No results for input(s): TSH, T4TOTAL, FREET4, T3FREE, THYROIDAB in the last 72 hours. Anemia Panel: No results for input(s): VITAMINB12, FOLATE, FERRITIN, TIBC, IRON, RETICCTPCT in the last 72 hours. Sepsis Labs: No results for input(s): PROCALCITON, LATICACIDVEN in the last 168 hours.  Recent Results (from the past 240 hour(s))  Respiratory (~20 pathogens) panel by PCR     Status: None   Collection Time: 02/21/21  3:49 PM   Specimen: Peripheral; Respiratory  Result Value Ref Range Status   Adenovirus NOT DETECTED NOT DETECTED Final   Coronavirus 229E NOT DETECTED NOT DETECTED Final    Comment: (NOTE) The Coronavirus on the Respiratory Panel, DOES NOT test for the novel  Coronavirus (2019 nCoV)    Coronavirus HKU1 NOT DETECTED NOT DETECTED Final   Coronavirus NL63 NOT DETECTED NOT DETECTED Final   Coronavirus OC43 NOT DETECTED NOT DETECTED Final   Metapneumovirus NOT DETECTED NOT DETECTED Final   Rhinovirus / Enterovirus NOT DETECTED NOT DETECTED Final    Influenza A NOT DETECTED NOT DETECTED Final   Influenza B NOT DETECTED NOT DETECTED Final   Parainfluenza Virus 1 NOT DETECTED NOT DETECTED Final   Parainfluenza Virus 2 NOT DETECTED NOT DETECTED Final   Parainfluenza Virus 3 NOT DETECTED NOT DETECTED Final   Parainfluenza Virus 4 NOT DETECTED NOT DETECTED Final   Respiratory Syncytial Virus NOT DETECTED NOT DETECTED Final   Bordetella pertussis NOT DETECTED NOT DETECTED Final   Bordetella Parapertussis NOT DETECTED NOT DETECTED Final   Chlamydophila pneumoniae NOT DETECTED NOT DETECTED Final   Mycoplasma pneumoniae NOT DETECTED NOT DETECTED Final    Comment: Performed at [facility], [address], [place], [place] [address]  Blood culture (routine x 2)     Status: None   Collection Time: 02/21/21  3:49 PM   Specimen: BLOOD  Result Value Ref Range Status   Specimen Description   Final    BLOOD BLOOD RIGHT ARM Performed at [facility], [address], [place], [place] [address]    Special Requests   Final    Blood Culture adequate volume BOTTLES DRAWN AEROBIC AND ANAEROBIC Performed at [facility], [address] [place], [place] [address]    Culture   Final    NO GROWTH 5 DAYS Performed at [facility], [place] [address] [place], [place] [address]    Report Status 02/26/2021 FINAL  Final  Blood culture (routine x 2)     Status: None   Collection Time: 02/21/21  3:49 PM   Specimen: BLOOD  Result Value Ref Range Status   Specimen Description   Final    BLOOD BLOOD LEFT ARM Performed at [facility], [address], [place], [place] [address]    Special Requests   Final    BOTTLES DRAWN AEROBIC AND ANAEROBIC Blood Culture adequate volume Performed at [facility], [address], [place], [place] [address]    Culture   Final    NO GROWTH 5 DAYS Performed at [facility], [place] [address], [place], [place] [address]    Report  Status 02/26/2021 FINAL  Final  Expectorated Sputum Assessment w Gram Stain, Rflx to Resp Cult     Status: None   Collection Time: 02/21/21 11:30 PM   Specimen: Expectorated Sputum  Result Value Ref Range Status   Specimen Description EXPECTORATED SPUTUM  Final   Special Requests  NONE  Final   Sputum evaluation   Final    THIS SPECIMEN IS ACCEPTABLE FOR SPUTUM CULTURE Performed at [facility] [address] [place], [place] [address]    Report Status 02/22/2021 FINAL  Final  Culture, Respiratory w Gram Stain     Status: None (Preliminary result)   Collection Time: 02/21/21 11:30 PM  Result Value Ref Range Status   Specimen Description   Final    EXPECTORATED SPUTUM Performed at [facility] [address] [place], [place] [address]    Special Requests   Final    NONE Reflexed from [id number] Performed at [facility], [place] [address] [place], [place] [address]    Gram Stain   Final    NO SQUAMOUS EPITHELIAL CELLS SEEN FEW WBC SEEN RARE GRAM NEGATIVE RODS FEW GRAM POSITIVE COCCI    Culture   Final    FEW PSEUDOMONAS AERUGINOSA Sent to [facility] for further susceptibility testing. Performed at [facility], [place] [address], [place], [place] [address]    Report Status PENDING  Incomplete  Acid Fast Smear (AFB)     Status: None   Collection Time: 02/22/21  1:00 AM   Specimen: Expectorated Sputum  Result Value Ref Range Status   AFB Specimen Processing Concentration  Final   Acid Fast Smear Negative  Final    Comment: (NOTE) Performed At: [facility] [place], [place] [id number] Rush Farmer MD [id number]    Source (AFB) EXPECTORATED SPUTUM  Final    Comment: Performed at [facility], [place] [address], [place], [place] [address]  Susceptibility, Aer + Anaerob     Status: Abnormal   Collection Time: 02/22/21  5:59 AM  Result Value Ref Range Status   Suscept, Aer +  Anaerob Preliminary report (A)  Final    Comment: (NOTE) Performed At: [facility] [place], [place] [id number] Rush Farmer MD [id number]    Source of Sample [phone]  Final    Comment: Performed at [facility], [place] [address] [place],  [address]  Susceptibility Result     Status: Abnormal   Collection Time: 02/22/21  5:59 AM  Result Value Ref Range Status   Suscept Result 1 Comment (A)  Final    Comment: (NOTE) Pseudomonas aeruginosa Identification performed by account, not confirmed by this laboratory. Performed At: [facility] [place], [place] [id number] Rush Farmer MD [id number]     Radiology Studies: No results found.   Scheduled Meds:  albuterol  3 mL Inhalation BID   atorvastatin  10 mg Oral Daily   azithromycin  250 mg Oral Daily   calcium carbonate  1,250 mg Oral BID WC   cholecalciferol  2,000 Units Oral Daily   dextromethorphan  30 mg Oral BID   diltiazem  120 mg Oral Daily   dornase alfa  2.5 mg Nebulization Daily   doxylamine (Sleep)  25 mg Oral QHS   feeding supplement  237 mL Oral BID BM   ferrous sulfate  325 mg Oral BID   HYDROcodone-acetaminophen  1 tablet Oral TID   mometasone-formoterol  2 puff Inhalation BID   montelukast  10 mg Oral Daily   pantoprazole  40 mg Oral Daily   [START ON 03/01/2021] revefenacin  175 mcg Nebulization Daily   tobramycin (PF)  300 mg Nebulization BID   vitamin C  250 mg Oral Daily   Continuous Infusions:  meropenem (MERREM) IV 1 g (02/28/21 0512)     LOS: 6 days    Time spent: 25 mins    Eli Pattillo, MD [facility]   If 7PM-7AM, please contact night-coverage

## 2021-02-28 NOTE — Plan of Care (Signed)

## 2021-03-01 DIAGNOSIS — J471 Bronchiectasis with (acute) exacerbation: Secondary | ICD-10-CM | POA: Diagnosis not present

## 2021-03-01 DIAGNOSIS — R042 Hemoptysis: Secondary | ICD-10-CM | POA: Diagnosis not present

## 2021-03-01 DIAGNOSIS — J9621 Acute and chronic respiratory failure with hypoxia: Secondary | ICD-10-CM | POA: Diagnosis not present

## 2021-03-01 LAB — SUSCEPTIBILITY RESULT

## 2021-03-01 LAB — SUSCEPTIBILITY, AER + ANAEROB: Source of Sample: 8680

## 2021-03-01 LAB — FUNGITELL, SERUM: Fungitell Result: <31 pg/mL (ref <80)

## 2021-03-01 NOTE — Progress Notes (Signed)
PROGRESS NOTE    [name]  JIR:[id number] DOB: [birth year] DOA: 02/21/2021 PCP: Aura Dials, MD    Brief Narrative:  This 64 years old male with PMH significant for bronchiectasis with chronic colonization of Pseudomonas, kidney stones, hypertension, OSA, GERD, prostate cancer, s/p right middle and right lower lobe lobectomy due to pulmonary infections presented with worsening shortness of breath despite treatment with ciprofloxacin as an outpatient. He developed hemoptysis 5 days while being on antibiotics.  Pulmonology is consulted,  hemoptysis is improving with some adjustment in medications there is no plan for bronchoscopy at this time.  Assessment & Plan:   Principal Problem:   Bronchiectasis with acute exacerbation ([place]) Active Problems:   Malignant neoplasm of prostate (HCC)   History of PSVT (paroxysmal supraventricular tachycardia)   HLD (hyperlipidemia)   Chronic pain   Cough with hemoptysis   Failure to thrive in adult  Bronchiectasis with acute exacerbation: Patient has chronic bronchiectasis with chronic Pseudomonas colonization. Patient has been following up with Dr. Olena Heckle at [facility]( primary pulmonologist). Patient at baseline uses 2 L of supplemental oxygen at night and on exertion. Pulmonology recommended to continue IV meropenum, tobramycin nebs and continue saline nebulization, albuterol, Spiriva, Symbicort and Pulmozyme. Sputum culture growing Pseudomonas, sensitivities pending. AFB cultures also sent results pending.   Hemoptysis: > Improving Patient initially had some streaking of blood with mucus. The patient has worsening blood in the sputum. Pulmonology consulted. Medications adjusted.   Patient has received tranexamic acid nebulizer therapy IV antibiotic changed from [place] to meropenem. He needs to be treated in hospital until hemoptysis resolves. He is at risk for massive, life-threatening hemoptysis. Continue meropenem, total 14-day  course. Obtain Fungitell galactomannan and fungus culture. If he still has hemoptysis by 12/12 pulmonology considers bronchoscopy and plan for IR guided embolization.  Chronic pain: Continue Norco  History of paroxysmal SVT: Continue Cardizem  Hyperlipidemia: Continue statin  Prior history of prostate cancer: Stable.  Failure to thrive in adult: Secondary to ongoing recurrent infection,  bronchiectasis at baseline. Continue regular diet dietary consult.   DVT prophylaxis: SCDs Code Status: Full code. Family Communication: Family at bedside Disposition Plan:  Status is: Inpatient  Remains inpatient appropriate because: Hemoptysis requiring pulmonology evaluation may require bronchoscopy.  Consultants:  Pulmonology  Procedures:   Antimicrobials:  Anti-infectives (From admission, onward)    Start     Dose/Rate Route Frequency Ordered Stop   02/25/21 1915  azithromycin (ZITHROMAX) tablet 250 mg        250 mg Oral Daily 02/25/21 1821     02/25/21 1830  meropenem (MERREM) 1 g in sodium chloride 0.9 % 100 mL IVPB        1 g 200 mL/hr over 30 Minutes Intravenous Every 8 hours 02/25/21 1823     02/22/21 0000  cefTAZidime (FORTAZ) 2 g in sodium chloride 0.9 % 100 mL IVPB  Status:  Discontinued        2 g 200 mL/hr over 30 Minutes Intravenous Every 8 hours 02/21/21 2048 02/21/21 2050   02/22/21 0000  cefTAZidime (FORTAZ) 1 g in sodium chloride 0.9 % 100 mL IVPB  Status:  Discontinued        1 g 200 mL/hr over 30 Minutes Intravenous Every 8 hours 02/21/21 2050 02/25/21 1818   02/21/21 2230  tobramycin (PF) ([name]) nebulizer solution 60 mg  Status:  Discontinued        60 mg Nebulization 2 times daily 02/21/21 2123 02/21/21 2214   02/21/21 2230  tobramycin (PF) ([name]) nebulizer solution 300 mg        300 mg Nebulization 2 times daily 02/21/21 2214     02/21/21 1515  cefTAZidime (FORTAZ) 2 g in sodium chloride 0.9 % 100 mL IVPB  Status:  Discontinued        2 g 200 mL/hr over  30 Minutes Intravenous Every 8 hours 02/21/21 1513 02/21/21 2047        Subjective: Patient was seen and examined at bedside.  Overnight events noted.   He still reports coughing up blood small amount.  He denies any chest pain and shortness of breath. Patient is sitting comfortably on the chair.  Objective: Vitals:   03/01/21 0440 03/01/21 0833 03/01/21 0854 03/01/21 1326  BP: (!) 104/51   129/76  Pulse: 77   86  Resp: 19   19  Temp: 98.1 F (36.7 C)   (!) 97.4 F (36.3 C)  TempSrc: Oral     SpO2:  95% 98% 94%  Weight:      Height:        Intake/Output Summary (Last 24 hours) at 03/01/2021 1349 Last data filed at 02/28/2021 2310 Gross per 24 hour  Intake 200.07 ml  Output --  Net 200.07 ml    Filed Weights   02/21/21 1238  Weight: 79.8 kg    Examination:  General exam: Appears deconditioned, comfortable, not in any acute distress. Respiratory system: Bronchial breath sounds on right side,  left side clear to auscultation.  RR 15 Cardiovascular system: S1-S2 heard, regular rate and rhythm, no murmur. Gastrointestinal system: Abdomen is soft, nontender, nondistended, BS+ Central nervous system: Alert and oriented x 3 . No focal neurological deficits. Extremities: No edema, no cyanosis, no clubbing. Skin: No rashes, lesions or ulcers Psychiatry: Judgement and insight appear normal. Mood & affect appropriate.     Data Reviewed: I have personally reviewed following labs and imaging studies  CBC: Recent Labs  Lab 02/24/21 1411 02/25/21 1817 02/27/21 0419  WBC 7.2 13.0* 7.3  NEUTROABS 4.8 10.4*  --   HGB 14.8 15.1 13.9  HCT 45.4 44.7 43.1  MCV 92.7 91.0 95.1  PLT 204 201 426    Basic Metabolic Panel: Recent Labs  Lab 02/24/21 1411 02/27/21 0419  NA 137 136  K 4.2 3.8  CL 98 100  CO2 27 29  GLUCOSE 135* 101*  BUN 23 31*  CREATININE 0.96 0.93  CALCIUM 9.4 8.7*  MG  --  2.0  PHOS  --  2.9    GFR: Estimated Creatinine Clearance: 79.1 mL/min  (by C-G formula based on SCr of 0.93 mg/dL). Liver Function Tests: Recent Labs  Lab 02/24/21 1411  AST 24  ALT 25  ALKPHOS 84  BILITOT 0.6  PROT 7.8  ALBUMIN 3.8    No results for input(s): LIPASE, AMYLASE in the last 168 hours. No results for input(s): AMMONIA in the last 168 hours. Coagulation Profile: Recent Labs  Lab 02/25/21 1817  INR 0.9    Cardiac Enzymes: No results for input(s): CKTOTAL, CKMB, CKMBINDEX, TROPONINI in the last 168 hours. BNP (last 3 results) No results for input(s): PROBNP in the last 8760 hours. HbA1C: No results for input(s): HGBA1C in the last 72 hours. CBG: No results for input(s): GLUCAP in the last 168 hours. Lipid Profile: No results for input(s): CHOL, HDL, LDLCALC, TRIG, CHOLHDL, LDLDIRECT in the last 72 hours. Thyroid Function Tests: No results for input(s): TSH, T4TOTAL, FREET4, T3FREE, THYROIDAB in the last 72 hours.  Anemia Panel: No results for input(s): VITAMINB12, FOLATE, FERRITIN, TIBC, IRON, RETICCTPCT in the last 72 hours. Sepsis Labs: No results for input(s): PROCALCITON, LATICACIDVEN in the last 168 hours.  Recent Results (from the past 240 hour(s))  Respiratory (~20 pathogens) panel by PCR     Status: None   Collection Time: 02/21/21  3:49 PM   Specimen: Peripheral; Respiratory  Result Value Ref Range Status   Adenovirus NOT DETECTED NOT DETECTED Final   Coronavirus 229E NOT DETECTED NOT DETECTED Final    Comment: (NOTE) The Coronavirus on the Respiratory Panel, DOES NOT test for the novel  Coronavirus (2019 nCoV)    Coronavirus HKU1 NOT DETECTED NOT DETECTED Final   Coronavirus NL63 NOT DETECTED NOT DETECTED Final   Coronavirus OC43 NOT DETECTED NOT DETECTED Final   Metapneumovirus NOT DETECTED NOT DETECTED Final   Rhinovirus / Enterovirus NOT DETECTED NOT DETECTED Final   Influenza A NOT DETECTED NOT DETECTED Final   Influenza B NOT DETECTED NOT DETECTED Final   Parainfluenza Virus 1 NOT DETECTED NOT DETECTED  Final   Parainfluenza Virus 2 NOT DETECTED NOT DETECTED Final   Parainfluenza Virus 3 NOT DETECTED NOT DETECTED Final   Parainfluenza Virus 4 NOT DETECTED NOT DETECTED Final   Respiratory Syncytial Virus NOT DETECTED NOT DETECTED Final   Bordetella pertussis NOT DETECTED NOT DETECTED Final   Bordetella Parapertussis NOT DETECTED NOT DETECTED Final   Chlamydophila pneumoniae NOT DETECTED NOT DETECTED Final   Mycoplasma pneumoniae NOT DETECTED NOT DETECTED Final    Comment: Performed at [facility], [address] [place], [place] [address]  Blood culture (routine x 2)     Status: None   Collection Time: 02/21/21  3:49 PM   Specimen: BLOOD  Result Value Ref Range Status   Specimen Description   Final    BLOOD BLOOD RIGHT ARM Performed at [facility], [address], [place], [place] [address]    Special Requests   Final    Blood Culture adequate volume BOTTLES DRAWN AEROBIC AND ANAEROBIC Performed at [facility], [address] [place], [place] [address]    Culture   Final    NO GROWTH 5 DAYS Performed at [facility], [place] [address], [place], [place] [address]    Report Status 02/26/2021 FINAL  Final  Blood culture (routine x 2)     Status: None   Collection Time: 02/21/21  3:49 PM   Specimen: BLOOD  Result Value Ref Range Status   Specimen Description   Final    BLOOD BLOOD LEFT ARM Performed at [facility], [address], [place], [place] [address]    Special Requests   Final    BOTTLES DRAWN AEROBIC AND ANAEROBIC Blood Culture adequate volume Performed at [facility], [address], [place], [place] [address]    Culture   Final    NO GROWTH 5 DAYS Performed at [facility], [place] [address], [place], [place] [address]    Report Status 02/26/2021 FINAL  Final  Expectorated Sputum Assessment w Gram Stain, Rflx to Resp Cult     Status: None   Collection Time: 02/21/21  11:30 PM   Specimen: Expectorated Sputum  Result Value Ref Range Status   Specimen Description EXPECTORATED SPUTUM  Final   Special Requests NONE  Final   Sputum evaluation   Final    THIS SPECIMEN IS ACCEPTABLE FOR SPUTUM CULTURE Performed at [facility], [place]  [address] [place], [place] [address]    Report Status 02/22/2021 FINAL  Final  Culture, Respiratory w Gram Stain     Status: None (Preliminary result)   Collection Time: 02/21/21 11:30 PM  Result Value Ref Range Status   Specimen Description   Final    EXPECTORATED SPUTUM Performed at [place] [address], [place], [place] [address]    Special Requests   Final    NONE Reflexed from [id number] Performed at [facility], [place] [address], [place], [place] [address]    Gram Stain   Final    NO SQUAMOUS EPITHELIAL CELLS SEEN FEW WBC SEEN RARE GRAM NEGATIVE RODS FEW GRAM POSITIVE COCCI    Culture   Final    FEW PSEUDOMONAS AERUGINOSA Sent to [place] for further susceptibility testing. Performed at [facility], [place] [address] [place], [place] [address]    Report Status PENDING  Incomplete  Acid Fast Smear (AFB)     Status: None   Collection Time: 02/22/21  1:00 AM   Specimen: Expectorated Sputum  Result Value Ref Range Status   AFB Specimen Processing Concentration  Final   Acid Fast Smear Negative  Final    Comment: (NOTE) Performed At: [facility] [place], [place] [id number] Rush Farmer MD [id number]    Source (AFB) EXPECTORATED SPUTUM  Final    Comment: Performed at [facility], [place] [address], [place], [place] [address]  Susceptibility, Aer + Anaerob     Status: Abnormal   Collection Time: 02/22/21  5:59 AM  Result Value Ref Range Status   Suscept, Aer + Anaerob Final report (A)  Corrected    Comment: (NOTE) Performed At: [facility] [place], [place] [id number] Rush Farmer MD EV:[id number] CORRECTED ON 12/10 AT 2993: PREVIOUSLY REPORTED AS Preliminary report    Source of Sample 8,680  Final    Comment: Performed at [facility], [place] [address] [place], [place] [address]  Susceptibility Result     Status: Abnormal   Collection Time: 02/22/21  5:59 AM  Result Value Ref Range Status   Suscept Result 1 Comment (A)  Final    Comment: (NOTE) Pseudomonas aeruginosa Multi-Drug Resistant Organism Identification performed by account, not confirmed by this laboratory.    Antimicrobial Suscept Comment  Corrected    Comment: (NOTE)      ** S = Susceptible; I = Intermediate; R = Resistant **                   P = Positive; N = Negative            MICS are expressed in micrograms per mL   Antibiotic                 RSLT#1    RSLT#2    RSLT#3    RSLT#4 Amikacin                       R Cefepime                       S Ceftazidime                    S Ciprofloxacin                  I Gentamicin  R Imipenem                       S Levofloxacin                   I Meropenem                      S Piperacillin                   S Tobramycin                     I Performed At: [facility] [place], [place] [id number] Rush Farmer MD [id number]      Radiology Studies: No results found.   Scheduled Meds:  albuterol  3 mL Inhalation BID   atorvastatin  10 mg Oral Daily   azithromycin  250 mg Oral Daily   calcium carbonate  1,250 mg Oral BID WC   cholecalciferol  2,000 Units Oral Daily   dextromethorphan  30 mg Oral BID   diltiazem  120 mg Oral Daily   dornase alfa  2.5 mg Nebulization Daily   doxylamine (Sleep)  25 mg Oral QHS   feeding supplement  237 mL Oral BID BM   ferrous sulfate  325 mg Oral BID   HYDROcodone-acetaminophen  1 tablet Oral TID   mometasone-formoterol  2 puff Inhalation BID   montelukast  10 mg Oral Daily   pantoprazole  40 mg Oral Daily   revefenacin  175 mcg Nebulization  Daily   tobramycin (PF)  300 mg Nebulization BID   vitamin C  250 mg Oral Daily   Continuous Infusions:  meropenem (MERREM) IV 1 g (03/01/21 0607)     LOS: 7 days    Time spent: 25 mins    Eshan Trupiano, MD [facility]   If 7PM-7AM, please contact night-coverage

## 2021-03-01 NOTE — Progress Notes (Signed)
NAME:  [name], MRN:  [id number], DOB:  1955/06/24, LOS: 7 ADMISSION DATE:  02/21/2021, CONSULTATION DATE:  12/6 REFERRING MD:  Posey Pronto, CHIEF COMPLAINT:  dyspnea, hemoptysis   History of Present Illness:  65 y/o male with a complex history of bronchiectasis with chronic colonization of pseudomonas presented with worsening dyspnea despite treatment with cipro as an outpatient.  He developed hemoptysis 5 days into therapy and pulmonary was consulted.   Pertinent  Medical History  Bronchiectasis S/p RML and RLL lobectomy in years past due to pulmonary infections Kidney stones Hypertension OSA GERD PONV Prostate cancer  Significant Hospital Events: Including procedures, antibiotic start and stop dates in addition to other pertinent events   12/2 admit to [facility], started on ceftaz 12/6 worsening hemoptysis, given TXA neb 12/6 CT chest > RUL with severe bronchiectasis, cavitary changes; diffuse bronchiectasis bilaterally, s/p RML and RLL lobectomy; scattered nodules Changed incruse to yupelri 12/9   Micro 12/2 sputum culture> pseudomonas>>> s to meropenem 12/2 BC x 2 neg  12/3 AFB neg smear >>> 12/8 fungus culture >  12/8 fugitell >  12/8 aspergillus Ag (galactomannan) >   Abx Torbramycin neb 12/2 >>> 12/2 fortaz 12/6 12/6 meropenem >  12/6 zmax>>>   Scheduled Meds:  albuterol  3 mL Inhalation BID   atorvastatin  10 mg Oral Daily   azithromycin  250 mg Oral Daily   calcium carbonate  1,250 mg Oral BID WC   cholecalciferol  2,000 Units Oral Daily   dextromethorphan  30 mg Oral BID   diltiazem  120 mg Oral Daily   dornase alfa  2.5 mg Nebulization Daily   doxylamine (Sleep)  25 mg Oral QHS   feeding supplement  237 mL Oral BID BM   ferrous sulfate  325 mg Oral BID   HYDROcodone-acetaminophen  1 tablet Oral TID   mometasone-formoterol  2 puff Inhalation BID   montelukast  10 mg Oral Daily   pantoprazole  40 mg Oral Daily   revefenacin  175 mcg Nebulization Daily    tobramycin (PF)  300 mg Nebulization BID   vitamin C  250 mg Oral Daily   Continuous Infusions:  meropenem (MERREM) IV 1 g (03/01/21 1432)   PRN Meds:.chlorpheniramine-HYDROcodone    Interim History / Subjective:  Feeling much better, no more hemoptysis     Objective   Blood pressure 129/76, pulse 86, temperature (!) 97.4 F (36.3 C), resp. rate 19, height 5\' 7"  (1.702 m), weight 79.8 kg, SpO2 94 %.        Intake/Output Summary (Last 24 hours) at 03/01/2021 1443 Last data filed at 02/28/2021 2310 Gross per 24 hour  Intake 116.56 ml  Output --  Net 116.56 ml   Filed Weights   02/21/21 1238  Weight: 79.8 kg    Examination: Tmax 99.1 General appearance:   up on side of bed /nad  At Rest 02 sats  98% on 2lpm    No jvd Oropharynx clear,  mucosa nl Neck supple Lungs with min diffuse exp > insp rhonchi bilaterally RRR no s3 or or sign murmur Abd soft with nl  excursion  Extr warm with no edema or clubbing noted Neuro  Sensorium intact,  no apparent motor deficits       Resolved Hospital Problem list     Assessment & Plan:  Acute exacerbation of bronchiectasis due to pseudomonas Chronic pseudomonas colonization Hemoptysis> recurrent again 12/8, non-massive, better since then Severe destruction of right upper lobe due to chronic  infection    Plan Check for fungal infection: fungitell, galactomannan, fungus culture Continue meropenem as pseudomonas is sensitive but ? What is the most cost effective?  >>>Would like pharamacy's recs on most cost effective rx x 14 days course If still  significant hemoptysis recurs would perform bronchoscopy and plan for IR guided embolization if really flares in meantime Continue tobi neb, azithro, therapy vest, pulmozym, dulera, protein shakes and mobility effort  I reviewed Dr Anastasia Pall plans and recs with pt at bedside - it does look like he may be turning the corner but too soon to call for sure.    >>>   He is on a dpi LAMA  which actually may aggravate the cough and would prefer Yupelri in this setting so ordered it 12/9  Best Practice (right click and "Reselect all SmartList Selections" daily)   Per [facility]   Labs   CBC: Recent Labs  Lab 02/24/21 1411 02/25/21 1817 02/27/21 0419  WBC 7.2 13.0* 7.3  NEUTROABS 4.8 10.4*  --   HGB 14.8 15.1 13.9  HCT 45.4 44.7 43.1  MCV 92.7 91.0 95.1  PLT 204 201 945    Basic Metabolic Panel: Recent Labs  Lab 02/24/21 1411 02/27/21 0419  NA 137 136  K 4.2 3.8  CL 98 100  CO2 27 29  GLUCOSE 135* 101*  BUN 23 31*  CREATININE 0.96 0.93  CALCIUM 9.4 8.7*  MG  --  2.0  PHOS  --  2.9   GFR: Estimated Creatinine Clearance: 79.1 mL/min (by C-G formula based on SCr of 0.93 mg/dL). Recent Labs  Lab 02/24/21 1411 02/25/21 1817 02/27/21 0419  WBC 7.2 13.0* 7.3    Liver Function Tests: Recent Labs  Lab 02/24/21 1411  AST 24  ALT 25  ALKPHOS 84  BILITOT 0.6  PROT 7.8  ALBUMIN 3.8   No results for input(s): LIPASE, AMYLASE in the last 168 hours. No results for input(s): AMMONIA in the last 168 hours.  ABG    Component Value Date/Time   PHART 7.377 12/06/2010 0508   PCO2ART 47.4 (H) 12/06/2010 0508   PO2ART 85.0 12/06/2010 0508   HCO3 24.2 05/23/2018 1545   TCO2 27 12/06/2010 2022   ACIDBASEDEF 0.2 05/23/2018 1545   O2SAT 77.9 05/23/2018 1545     Coagulation Profile: Recent Labs  Lab 02/25/21 1817  INR 0.9    Cardiac Enzymes: No results for input(s): CKTOTAL, CKMB, CKMBINDEX, TROPONINI in the last 168 hours.  HbA1C: No results found for: HGBA1C  CBG: No results for input(s): GLUCAP in the last 168 hours.     Christinia Gully, MD [facility] [place] [phone]   After 7:00 pm call Elink  [phone]

## 2021-03-02 DIAGNOSIS — J471 Bronchiectasis with (acute) exacerbation: Secondary | ICD-10-CM | POA: Diagnosis not present

## 2021-03-02 NOTE — Progress Notes (Signed)
PROGRESS NOTE    [name]  FGH:[id number] DOB: 1955-08-28 DOA: 02/21/2021 PCP: Aura Dials, MD    Brief Narrative:  This 65 years old male with PMH significant for bronchiectasis with chronic colonization of Pseudomonas, kidney stones, hypertension, OSA, GERD, prostate cancer, s/p right middle and right lower lobe lobectomy due to pulmonary infections presented with worsening shortness of breath despite treatment with ciprofloxacin as an outpatient. He developed hemoptysis 5 days while being on antibiotics.  Pulmonology is consulted,  hemoptysis is improving with some adjustment in medications there is no plan for bronchoscopy at this time.  Assessment & Plan:   Principal Problem:   Bronchiectasis with acute exacerbation ([place]) Active Problems:   Malignant neoplasm of prostate (HCC)   History of PSVT (paroxysmal supraventricular tachycardia)   HLD (hyperlipidemia)   Chronic pain   Cough with hemoptysis   Failure to thrive in adult   Acute on chronic respiratory failure with hypoxia (HCC)  Bronchiectasis with acute exacerbation: Patient has chronic bronchiectasis with chronic Pseudomonas colonization. Patient has been following up with Dr. Olena Heckle at [facility]( primary pulmonologist). Patient at baseline uses 2 L of supplemental oxygen at night and on exertion. Pulmonology recommended to continue IV meropenum, tobramycin nebs and continue saline nebulization, albuterol, Spiriva, Symbicort and Pulmozyme. Sputum culture growing Pseudomonas, sensitivities pending. AFB cultures also sent results pending.  Hemoptysis: > Improving Patient initially had some streaking of blood with mucus. The patient has worsening blood in the sputum. Pulmonology consulted. Medications adjusted.   Patient has received tranexamic acid nebulizer therapy IV antibiotic changed from [place] to meropenem. He needs to be treated in hospital until hemoptysis resolves. He is at risk for massive,  life-threatening hemoptysis. Continue meropenem, total 14-day course. Obtain Fungitell galactomannan and fungus culture. If he still has hemoptysis by 12/12 pulmonology considers bronchoscopy and plan for IR guided embolization.  Chronic pain: Continue Norco  History of paroxysmal SVT: Continue Cardizem. HR controlled.  Hyperlipidemia: Continue statin  Prior history of prostate cancer: Stable.  Failure to thrive in adult: Secondary to ongoing recurrent infection,  bronchiectasis at baseline. Continue regular diet dietary consult.   DVT prophylaxis: SCDs Code Status: Full code. Family Communication: Family at bedside Disposition Plan:  Status is: Inpatient  Remains inpatient appropriate because: Hemoptysis requiring pulmonology evaluation may require bronchoscopy.  Consultants:  Pulmonology  Procedures:   Antimicrobials:  Anti-infectives (From admission, onward)    Start     Dose/Rate Route Frequency Ordered Stop   02/25/21 1915  azithromycin (ZITHROMAX) tablet 250 mg        250 mg Oral Daily 02/25/21 1821     02/25/21 1830  meropenem (MERREM) 1 g in sodium chloride 0.9 % 100 mL IVPB        1 g 200 mL/hr over 30 Minutes Intravenous Every 8 hours 02/25/21 1823     02/22/21 0000  cefTAZidime (FORTAZ) 2 g in sodium chloride 0.9 % 100 mL IVPB  Status:  Discontinued        2 g 200 mL/hr over 30 Minutes Intravenous Every 8 hours 02/21/21 2048 02/21/21 2050   02/22/21 0000  cefTAZidime (FORTAZ) 1 g in sodium chloride 0.9 % 100 mL IVPB  Status:  Discontinued        1 g 200 mL/hr over 30 Minutes Intravenous Every 8 hours 02/21/21 2050 02/25/21 1818   02/21/21 2230  tobramycin (PF) ([name]) nebulizer solution 60 mg  Status:  Discontinued        60 mg Nebulization 2  times daily 02/21/21 2123 02/21/21 2214   02/21/21 2230  tobramycin (PF) ([name]) nebulizer solution 300 mg        300 mg Nebulization 2 times daily 02/21/21 2214     02/21/21 1515  cefTAZidime (FORTAZ) 2 g in  sodium chloride 0.9 % 100 mL IVPB  Status:  Discontinued        2 g 200 mL/hr over 30 Minutes Intravenous Every 8 hours 02/21/21 1513 02/21/21 2047        Subjective: Patient was seen and examined at bedside.  Overnight events noted.   Patient denies any chest pain or shortness of breath.  He still reports having small amount of blood while coughing. Patient is sitting comfortably on the chair.  Objective: Vitals:   03/01/21 2100 03/02/21 0504 03/02/21 0904 03/02/21 1219  BP: 116/77 (!) 101/49  119/82  Pulse: 87 70  85  Resp: 18 18  17   Temp: 98.6 F (37 C) 98.6 F (37 C)  98.9 F (37.2 C)  TempSrc: Oral Oral    SpO2: 94% 96% 95% 94%  Weight:      Height:        Intake/Output Summary (Last 24 hours) at 03/02/2021 1507 Last data filed at 03/02/2021 0547 Gross per 24 hour  Intake 1000.13 ml  Output --  Net 1000.13 ml   Filed Weights   02/21/21 1238  Weight: 79.8 kg    Examination:  General exam: Appears comfortable, not in any acute distress. Respiratory system: Bronchial breath sounds on right side,  left side clear to auscultation.  RR 15 Cardiovascular system: S1-S2 heard, regular rate and rhythm, no murmur. Gastrointestinal system: Abdomen is soft, nontender, nondistended, BS+ Central nervous system: Alert and oriented x 3 . No focal neurological deficits. Extremities: No edema, no cyanosis, no clubbing. Skin: No rashes, lesions or ulcers Psychiatry: Judgement and insight appear normal. Mood & affect appropriate.     Data Reviewed: I have personally reviewed following labs and imaging studies  CBC: Recent Labs  Lab 02/24/21 1411 02/25/21 1817 02/27/21 0419  WBC 7.2 13.0* 7.3  NEUTROABS 4.8 10.4*  --   HGB 14.8 15.1 13.9  HCT 45.4 44.7 43.1  MCV 92.7 91.0 95.1  PLT 204 201 878   Basic Metabolic Panel: Recent Labs  Lab 02/24/21 1411 02/27/21 0419  NA 137 136  K 4.2 3.8  CL 98 100  CO2 27 29  GLUCOSE 135* 101*  BUN 23 31*  CREATININE 0.96  0.93  CALCIUM 9.4 8.7*  MG  --  2.0  PHOS  --  2.9   GFR: Estimated Creatinine Clearance: 79.1 mL/min (by C-G formula based on SCr of 0.93 mg/dL). Liver Function Tests: Recent Labs  Lab 02/24/21 1411  AST 24  ALT 25  ALKPHOS 84  BILITOT 0.6  PROT 7.8  ALBUMIN 3.8   No results for input(s): LIPASE, AMYLASE in the last 168 hours. No results for input(s): AMMONIA in the last 168 hours. Coagulation Profile: Recent Labs  Lab 02/25/21 1817  INR 0.9   Cardiac Enzymes: No results for input(s): CKTOTAL, CKMB, CKMBINDEX, TROPONINI in the last 168 hours. BNP (last 3 results) No results for input(s): PROBNP in the last 8760 hours. HbA1C: No results for input(s): HGBA1C in the last 72 hours. CBG: No results for input(s): GLUCAP in the last 168 hours. Lipid Profile: No results for input(s): CHOL, HDL, LDLCALC, TRIG, CHOLHDL, LDLDIRECT in the last 72 hours. Thyroid Function Tests: No results for input(s): TSH,  T4TOTAL, FREET4, T3FREE, THYROIDAB in the last 72 hours. Anemia Panel: No results for input(s): VITAMINB12, FOLATE, FERRITIN, TIBC, IRON, RETICCTPCT in the last 72 hours. Sepsis Labs: No results for input(s): PROCALCITON, LATICACIDVEN in the last 168 hours.  Recent Results (from the past 240 hour(s))  Respiratory (~20 pathogens) panel by PCR     Status: None   Collection Time: 02/21/21  3:49 PM   Specimen: Peripheral; Respiratory  Result Value Ref Range Status   Adenovirus NOT DETECTED NOT DETECTED Final   Coronavirus 229E NOT DETECTED NOT DETECTED Final    Comment: (NOTE) The Coronavirus on the Respiratory Panel, DOES NOT test for the novel  Coronavirus (2019 nCoV)    Coronavirus HKU1 NOT DETECTED NOT DETECTED Final   Coronavirus NL63 NOT DETECTED NOT DETECTED Final   Coronavirus OC43 NOT DETECTED NOT DETECTED Final   Metapneumovirus NOT DETECTED NOT DETECTED Final   Rhinovirus / Enterovirus NOT DETECTED NOT DETECTED Final   Influenza A NOT DETECTED NOT DETECTED  Final   Influenza B NOT DETECTED NOT DETECTED Final   Parainfluenza Virus 1 NOT DETECTED NOT DETECTED Final   Parainfluenza Virus 2 NOT DETECTED NOT DETECTED Final   Parainfluenza Virus 3 NOT DETECTED NOT DETECTED Final   Parainfluenza Virus 4 NOT DETECTED NOT DETECTED Final   Respiratory Syncytial Virus NOT DETECTED NOT DETECTED Final   Bordetella pertussis NOT DETECTED NOT DETECTED Final   Bordetella Parapertussis NOT DETECTED NOT DETECTED Final   Chlamydophila pneumoniae NOT DETECTED NOT DETECTED Final   Mycoplasma pneumoniae NOT DETECTED NOT DETECTED Final    Comment: Performed at [facility], [address] [place], [place] [address]  Blood culture (routine x 2)     Status: None   Collection Time: 02/21/21  3:49 PM   Specimen: BLOOD  Result Value Ref Range Status   Specimen Description   Final    BLOOD BLOOD RIGHT ARM Performed at [facility], [address] [place], [place] [address]    Special Requests   Final    Blood Culture adequate volume BOTTLES DRAWN AEROBIC AND ANAEROBIC Performed at [facility], [address] [place], [place] [address]    Culture   Final    NO GROWTH 5 DAYS Performed at [facility], [place] [address], [place], [place] [address]    Report Status 02/26/2021 FINAL  Final  Blood culture (routine x 2)     Status: None   Collection Time: 02/21/21  3:49 PM   Specimen: BLOOD  Result Value Ref Range Status   Specimen Description   Final    BLOOD BLOOD LEFT ARM Performed at [facility], [address], [place], [place] [address]    Special Requests   Final    BOTTLES DRAWN AEROBIC AND ANAEROBIC Blood Culture adequate volume Performed at [facility], [address] [place], [place] [address]    Culture   Final    NO GROWTH 5 DAYS Performed at [facility], [place] [address] [place], [place] [address]    Report Status 02/26/2021 FINAL  Final   Expectorated Sputum Assessment w Gram Stain, Rflx to Resp Cult     Status: None   Collection Time: 02/21/21 11:30 PM   Specimen: Expectorated Sputum  Result Value Ref Range Status   Specimen Description EXPECTORATED SPUTUM  Final   Special Requests NONE  Final   Sputum evaluation   Final    THIS SPECIMEN IS ACCEPTABLE FOR SPUTUM  CULTURE Performed at [facility], [place] [address] [place], [place] [address]    Report Status 02/22/2021 FINAL  Final  Culture, Respiratory w Gram Stain     Status: None (Preliminary result)   Collection Time: 02/21/21 11:30 PM  Result Value Ref Range Status   Specimen Description   Final    EXPECTORATED SPUTUM Performed at [place] [address], [place], [place] [address]    Special Requests   Final    NONE Reflexed from [id number] Performed at [facility], [place] [address], [place], [place] [address]    Gram Stain   Final    NO SQUAMOUS EPITHELIAL CELLS SEEN FEW WBC SEEN RARE GRAM NEGATIVE RODS FEW GRAM POSITIVE COCCI    Culture   Final    FEW PSEUDOMONAS AERUGINOSA Sent to [place] for further susceptibility testing. Performed at [facility], [place] [address], [place], [place] [address]    Report Status PENDING  Incomplete  Acid Fast Smear (AFB)     Status: None   Collection Time: 02/22/21  1:00 AM   Specimen: Expectorated Sputum  Result Value Ref Range Status   AFB Specimen Processing Concentration  Final   Acid Fast Smear Negative  Final    Comment: (NOTE) Performed At: [facility] [place], [place] [id number] Rush Farmer MD [id number]    Source (AFB) EXPECTORATED SPUTUM  Final    Comment: Performed at [facility], [place] [address] [place], [place] [address]  Susceptibility, Aer + Anaerob     Status: Abnormal   Collection Time: 02/22/21  5:59 AM  Result Value Ref Range Status   Suscept, Aer + Anaerob Final report (A)   Corrected    Comment: (NOTE) Performed At: [facility] [place], [place] [id number] Rush Farmer MD [id number] CORRECTED ON 12/10 AT 7793: PREVIOUSLY REPORTED AS Preliminary report    Source of Sample 8,680  Final    Comment: Performed at [facility], [place] [address], [place], [place] [address]  Susceptibility Result     Status: Abnormal   Collection Time: 02/22/21  5:59 AM  Result Value Ref Range Status   Suscept Result 1 Comment (A)  Final    Comment: (NOTE) Pseudomonas aeruginosa Multi-Drug Resistant Organism Identification performed by account, not confirmed by this laboratory.    Antimicrobial Suscept Comment  Corrected    Comment: (NOTE)      ** S = Susceptible; I = Intermediate; R = Resistant **                   P = Positive; N = Negative            MICS are expressed in micrograms per mL   Antibiotic                 RSLT#1    RSLT#2    RSLT#3    RSLT#4 Amikacin                       R Cefepime                       S Ceftazidime                    S Ciprofloxacin                  I Gentamicin  R Imipenem                       S Levofloxacin                   I Meropenem                      S Piperacillin                   S Tobramycin                     I Performed At: [facility] [place], [place] [id number] Rush Farmer MD [id number]      Radiology Studies: No results found.   Scheduled Meds:  albuterol  3 mL Inhalation BID   atorvastatin  10 mg Oral Daily   azithromycin  250 mg Oral Daily   calcium carbonate  1,250 mg Oral BID WC   cholecalciferol  2,000 Units Oral Daily   dextromethorphan  30 mg Oral BID   diltiazem  120 mg Oral Daily   dornase alfa  2.5 mg Nebulization Daily   doxylamine (Sleep)  25 mg Oral QHS   feeding supplement  237 mL Oral BID BM   ferrous sulfate  325 mg Oral BID   HYDROcodone-acetaminophen  1 tablet Oral TID   mometasone-formoterol  2 puff  Inhalation BID   montelukast  10 mg Oral Daily   pantoprazole  40 mg Oral Daily   revefenacin  175 mcg Nebulization Daily   tobramycin (PF)  300 mg Nebulization BID   vitamin C  250 mg Oral Daily   Continuous Infusions:  meropenem (MERREM) IV 1 g (03/02/21 1444)     LOS: 8 days    Time spent: 25 mins    Janett Kamath, MD [facility]   If 7PM-7AM, please contact night-coverage

## 2021-03-03 ENCOUNTER — Inpatient Hospital Stay: Payer: Self-pay

## 2021-03-03 DIAGNOSIS — J471 Bronchiectasis with (acute) exacerbation: Secondary | ICD-10-CM | POA: Diagnosis not present

## 2021-03-03 LAB — CULTURE, RESPIRATORY W GRAM STAIN: Gram Stain: NONE SEEN

## 2021-03-03 MED ORDER — SODIUM CHLORIDE 0.9 % IV SOLN
1.0000 g | Freq: Three times a day (TID) | INTRAVENOUS | Status: DC
  Administered 2021-03-03 – 2021-03-04 (×4): 1 g via INTRAVENOUS
  Filled 2021-03-03 (×5): qty 1

## 2021-03-03 MED ORDER — SODIUM CHLORIDE 0.9 % IV SOLN
2.0000 g | Freq: Three times a day (TID) | INTRAVENOUS | Status: DC
  Filled 2021-03-03 (×2): qty 2

## 2021-03-03 NOTE — Progress Notes (Addendum)
ID Pharmacy Note   [name]'s pseudomonas in his respiratory culture shows susceptibility to ceftazidime, cefepime , carbapenems and piperacillin-tazobactam.   For IV antibiotics, ceftazidime will be the most cost effective for [name] at about $8.19/day with home health but meropenem only $11/day. Will keep meropenem per Dr Valeta Harms.    Proctor Footman, PharmD, BCPS, BCIDP Infectious Diseases Clinical Pharmacist Phone: [phone] 03/03/2021 12:16 PM

## 2021-03-03 NOTE — Progress Notes (Signed)
PROGRESS NOTE    [name]  NLZ:[id number] DOB: [birth year] DOA: 02/21/2021 PCP: Aura Dials, MD    Brief Narrative:  This 65 years old male with PMH significant for bronchiectasis with chronic colonization of Pseudomonas, kidney stones, hypertension, OSA, GERD, prostate cancer, s/p right middle and right lower lobe lobectomy due to pulmonary infections presented with worsening shortness of breath despite treatment with ciprofloxacin as an outpatient. He developed hemoptysis 5 days while being on antibiotics.  Pulmonology is consulted,  hemoptysis is improving with some adjustment in medications there is no plan for bronchoscopy at this time.  Assessment & Plan:   Principal Problem:   Bronchiectasis with acute exacerbation ([place]) Active Problems:   Malignant neoplasm of prostate (HCC)   History of PSVT (paroxysmal supraventricular tachycardia)   HLD (hyperlipidemia)   Chronic pain   Cough with hemoptysis   Failure to thrive in adult   Acute on chronic respiratory failure with hypoxia (HCC)  Bronchiectasis with acute exacerbation: Patient has chronic bronchiectasis with chronic Pseudomonas colonization. Patient has been following up with Dr. Olena Heckle at [facility]( primary pulmonologist). Patient at baseline uses 2 L of supplemental oxygen at night and on exertion. Pulmonology recommended to continue IV meropenum, tobramycin nebs and continue saline nebulization, albuterol, Spiriva, Symbicort and Pulmozyme. Sputum culture growing Pseudomonas, sensitive to Fortaz and, meropenem. AFB cultures also sent results pending.  Hemoptysis: > Improving Patient initially had some streaking of blood with mucus. The patient has worsening blood in the sputum. Pulmonology consulted. Medications adjusted.   Patient has received tranexamic acid nebulizer therapy IV antibiotic changed from [place] to meropenem. He needs to be treated in hospital until hemoptysis resolves. He is at risk for massive,  life-threatening hemoptysis. Continue meropenem, total 21-day course. Obtain Fungitell galactomannan and fungus culture. If he still has hemoptysis by 12/12 pulmonology considers bronchoscopy and plan for IR guided embolization. Patient will need PICC line and 21 days of IV meropenem that needs to be arranged.  Chronic pain: Continue Norco  History of paroxysmal SVT: Continue Cardizem. HR controlled.  Hyperlipidemia: Continue statin  Prior history of prostate cancer: Stable.  Failure to thrive in adult: Secondary to ongoing recurrent infection,  bronchiectasis at baseline. Continue regular diet dietary consult.   DVT prophylaxis: SCDs Code Status: Full code. Family Communication: Family at bedside Disposition Plan:  Status is: Inpatient  Remains inpatient appropriate because: Hemoptysis, required pulmonary evaluation plan was for bronchoscopy but has improved. Now patient needs PICC line for long-term IV antibiotic 21 days of meropenem.   Consultants:  Pulmonology  Procedures:   Antimicrobials:  Anti-infectives (From admission, onward)    Start     Dose/Rate Route Frequency Ordered Stop   03/03/21 1400  cefTAZidime (FORTAZ) 2 g in sodium chloride 0.9 % 100 mL IVPB  Status:  Discontinued        2 g 200 mL/hr over 30 Minutes Intravenous Every 8 hours 03/03/21 1213 03/03/21 1256   03/03/21 1400  meropenem (MERREM) 1 g in sodium chloride 0.9 % 100 mL IVPB        1 g 200 mL/hr over 30 Minutes Intravenous Every 8 hours 03/03/21 1256     02/25/21 1915  azithromycin (ZITHROMAX) tablet 250 mg        250 mg Oral Daily 02/25/21 1821     02/25/21 1830  meropenem (MERREM) 1 g in sodium chloride 0.9 % 100 mL IVPB  Status:  Discontinued        1 g 200 mL/hr over  30 Minutes Intravenous Every 8 hours 02/25/21 1823 03/03/21 1213   02/22/21 0000  cefTAZidime (FORTAZ) 2 g in sodium chloride 0.9 % 100 mL IVPB  Status:  Discontinued        2 g 200 mL/hr over 30 Minutes Intravenous  Every 8 hours 02/21/21 2048 02/21/21 2050   02/22/21 0000  cefTAZidime (FORTAZ) 1 g in sodium chloride 0.9 % 100 mL IVPB  Status:  Discontinued        1 g 200 mL/hr over 30 Minutes Intravenous Every 8 hours 02/21/21 2050 02/25/21 1818   02/21/21 2230  tobramycin (PF) ([name]) nebulizer solution 60 mg  Status:  Discontinued        60 mg Nebulization 2 times daily 02/21/21 2123 02/21/21 2214   02/21/21 2230  tobramycin (PF) ([name]) nebulizer solution 300 mg        300 mg Nebulization 2 times daily 02/21/21 2214     02/21/21 1515  cefTAZidime (FORTAZ) 2 g in sodium chloride 0.9 % 100 mL IVPB  Status:  Discontinued        2 g 200 mL/hr over 30 Minutes Intravenous Every 8 hours 02/21/21 1513 02/21/21 2047        Subjective: Patient was seen and examined at bedside.  Overnight events noted.   Patient reports feeling much better.  He still has small amount of blood in the phlegm. Patient is sitting comfortably on the chair.  He was getting breathing treatment when seen.  Objective: Vitals:   03/03/21 0436 03/03/21 0859 03/03/21 0933 03/03/21 1301  BP: 114/74   (!) 120/97  Pulse: 76   84  Resp: 18 18  18   Temp: 98.1 F (36.7 C)   98.1 F (36.7 C)  TempSrc:    Oral  SpO2: 96%  94% 93%  Weight:      Height:       No intake or output data in the 24 hours ending 03/03/21 1341  Filed Weights   02/21/21 1238  Weight: 79.8 kg    Examination:  General exam: Appears comfortable, not in any acute distress.  Deconditioned. Respiratory system: Bronchial breath sounds on the right side, clear to auscultation left side. RR 15 Cardiovascular system: S1-S2 heard, regular rate and rhythm, no murmur. Gastrointestinal system: Abdomen is soft, nontender, nondistended, BS+ Central nervous system: Alert and oriented x 3 . No focal neurological deficits. Extremities: No edema, no cyanosis, no clubbing. Skin: No rashes, lesions or ulcers Psychiatry: Judgement and insight appear normal. Mood & affect  appropriate.     Data Reviewed: I have personally reviewed following labs and imaging studies  CBC: Recent Labs  Lab 02/24/21 1411 02/25/21 1817 02/27/21 0419  WBC 7.2 13.0* 7.3  NEUTROABS 4.8 10.4*  --   HGB 14.8 15.1 13.9  HCT 45.4 44.7 43.1  MCV 92.7 91.0 95.1  PLT 204 201 371   Basic Metabolic Panel: Recent Labs  Lab 02/24/21 1411 02/27/21 0419  NA 137 136  K 4.2 3.8  CL 98 100  CO2 27 29  GLUCOSE 135* 101*  BUN 23 31*  CREATININE 0.96 0.93  CALCIUM 9.4 8.7*  MG  --  2.0  PHOS  --  2.9   GFR: Estimated Creatinine Clearance: 79.1 mL/min (by C-G formula based on SCr of 0.93 mg/dL). Liver Function Tests: Recent Labs  Lab 02/24/21 1411  AST 24  ALT 25  ALKPHOS 84  BILITOT 0.6  PROT 7.8  ALBUMIN 3.8   No results for input(s):  LIPASE, AMYLASE in the last 168 hours. No results for input(s): AMMONIA in the last 168 hours. Coagulation Profile: Recent Labs  Lab 02/25/21 1817  INR 0.9   Cardiac Enzymes: No results for input(s): CKTOTAL, CKMB, CKMBINDEX, TROPONINI in the last 168 hours. BNP (last 3 results) No results for input(s): PROBNP in the last 8760 hours. HbA1C: No results for input(s): HGBA1C in the last 72 hours. CBG: No results for input(s): GLUCAP in the last 168 hours. Lipid Profile: No results for input(s): CHOL, HDL, LDLCALC, TRIG, CHOLHDL, LDLDIRECT in the last 72 hours. Thyroid Function Tests: No results for input(s): TSH, T4TOTAL, FREET4, T3FREE, THYROIDAB in the last 72 hours. Anemia Panel: No results for input(s): VITAMINB12, FOLATE, FERRITIN, TIBC, IRON, RETICCTPCT in the last 72 hours. Sepsis Labs: No results for input(s): PROCALCITON, LATICACIDVEN in the last 168 hours.  Recent Results (from the past 240 hour(s))  Respiratory (~20 pathogens) panel by PCR     Status: None   Collection Time: 02/21/21  3:49 PM   Specimen: Peripheral; Respiratory  Result Value Ref Range Status   Adenovirus NOT DETECTED NOT DETECTED Final    Coronavirus 229E NOT DETECTED NOT DETECTED Final    Comment: (NOTE) The Coronavirus on the Respiratory Panel, DOES NOT test for the novel  Coronavirus (2019 nCoV)    Coronavirus HKU1 NOT DETECTED NOT DETECTED Final   Coronavirus NL63 NOT DETECTED NOT DETECTED Final   Coronavirus OC43 NOT DETECTED NOT DETECTED Final   Metapneumovirus NOT DETECTED NOT DETECTED Final   Rhinovirus / Enterovirus NOT DETECTED NOT DETECTED Final   Influenza A NOT DETECTED NOT DETECTED Final   Influenza B NOT DETECTED NOT DETECTED Final   Parainfluenza Virus 1 NOT DETECTED NOT DETECTED Final   Parainfluenza Virus 2 NOT DETECTED NOT DETECTED Final   Parainfluenza Virus 3 NOT DETECTED NOT DETECTED Final   Parainfluenza Virus 4 NOT DETECTED NOT DETECTED Final   Respiratory Syncytial Virus NOT DETECTED NOT DETECTED Final   Bordetella pertussis NOT DETECTED NOT DETECTED Final   Bordetella Parapertussis NOT DETECTED NOT DETECTED Final   Chlamydophila pneumoniae NOT DETECTED NOT DETECTED Final   Mycoplasma pneumoniae NOT DETECTED NOT DETECTED Final    Comment: Performed at [facility], [address] [place], [place] [address]  Blood culture (routine x 2)     Status: None   Collection Time: 02/21/21  3:49 PM   Specimen: BLOOD  Result Value Ref Range Status   Specimen Description   Final    BLOOD BLOOD RIGHT ARM Performed at [facility], [address], [place], [place] [address]    Special Requests   Final    Blood Culture adequate volume BOTTLES DRAWN AEROBIC AND ANAEROBIC Performed at [facility], [address], [place], [place] [address]    Culture   Final    NO GROWTH 5 DAYS Performed at [facility], [place] [address] [place], [place] [address]    Report Status 02/26/2021 FINAL  Final  Blood culture (routine x 2)     Status: None   Collection Time: 02/21/21  3:49 PM   Specimen: BLOOD  Result Value Ref Range Status   Specimen Description   Final     BLOOD BLOOD LEFT ARM Performed at [facility], [address] [place], [place] [address]    Special Requests   Final    BOTTLES DRAWN AEROBIC AND ANAEROBIC Blood Culture adequate volume Performed at [facility], [place]  [place], [place], [place] [address]    Culture   Final    NO GROWTH 5 DAYS Performed at [facility], [place] [address] [place], [place] [address]    Report Status 02/26/2021 FINAL  Final  Expectorated Sputum Assessment w Gram Stain, Rflx to Resp Cult     Status: None   Collection Time: 02/21/21 11:30 PM   Specimen: Expectorated Sputum  Result Value Ref Range Status   Specimen Description EXPECTORATED SPUTUM  Final   Special Requests NONE  Final   Sputum evaluation   Final    THIS SPECIMEN IS ACCEPTABLE FOR SPUTUM CULTURE Performed at [facility], [place] [address], [place], [place] [address]    Report Status 02/22/2021 FINAL  Final  Culture, Respiratory w Gram Stain     Status: None (Preliminary result)   Collection Time: 02/21/21 11:30 PM  Result Value Ref Range Status   Specimen Description   Final    EXPECTORATED SPUTUM Performed at [place] [address] [place], [place] [address]    Special Requests   Final    NONE Reflexed from [id number] Performed at [facility], [place] [address], [place], [place] [address]    Gram Stain   Final    NO SQUAMOUS EPITHELIAL CELLS SEEN FEW WBC SEEN RARE GRAM NEGATIVE RODS FEW GRAM POSITIVE COCCI    Culture   Final    FEW PSEUDOMONAS AERUGINOSA Sent to [place] for further susceptibility testing. Performed at [facility], [place] [address], [place], [place] [address]    Report Status PENDING  Incomplete  Acid Fast Smear (AFB)     Status: None   Collection Time: 02/22/21  1:00 AM   Specimen: Expectorated Sputum  Result Value Ref Range Status   AFB Specimen Processing Concentration  Final   Acid Fast Smear  Negative  Final    Comment: (NOTE) Performed At: [facility] [place], [place] [id number] Rush Farmer MD QQ:[id number]    Source (AFB) EXPECTORATED SPUTUM  Final    Comment: Performed at [facility], [place] [address] [place], [place] [address]  Susceptibility, Aer + Anaerob     Status: Abnormal   Collection Time: 02/22/21  5:59 AM  Result Value Ref Range Status   Suscept, Aer + Anaerob Final report (A)  Corrected    Comment: (NOTE) Performed At: [facility] [place], [place] [id number] Rush Farmer MD TK:[id number] CORRECTED ON 12/10 AT 5732: PREVIOUSLY REPORTED AS Preliminary report    Source of Sample 8,680  Final    Comment: Performed at [facility], [place] [address] [place], [place] [address]  Susceptibility Result     Status: Abnormal   Collection Time: 02/22/21  5:59 AM  Result Value Ref Range Status   Suscept Result 1 Comment (A)  Final    Comment: (NOTE) Pseudomonas aeruginosa Multi-Drug Resistant Organism Identification performed by account, not confirmed by this laboratory.    Antimicrobial Suscept Comment  Corrected    Comment: (NOTE)      ** S = Susceptible; I = Intermediate; R = Resistant **                   P = Positive; N = Negative            MICS are expressed in micrograms per mL   Antibiotic  RSLT#1    RSLT#2    RSLT#3    RSLT#4 Amikacin                       R Cefepime                       S Ceftazidime                    S Ciprofloxacin                  I Gentamicin                     R Imipenem                       S Levofloxacin                   I Meropenem                      S Piperacillin                   S Tobramycin                     I Performed At: [facility] [place], [place] [id number] Rush Farmer MD XT:[id number]      Radiology Studies: [place] EKG SITE RITE  Result Date: 03/03/2021 If Site Rite image not  attached, placement could not be confirmed due to current cardiac rhythm.    Scheduled Meds:  albuterol  3 mL Inhalation BID   atorvastatin  10 mg Oral Daily   azithromycin  250 mg Oral Daily   calcium carbonate  1,250 mg Oral BID WC   cholecalciferol  2,000 Units Oral Daily   dextromethorphan  30 mg Oral BID   diltiazem  120 mg Oral Daily   dornase alfa  2.5 mg Nebulization Daily   doxylamine (Sleep)  25 mg Oral QHS   feeding supplement  237 mL Oral BID BM   ferrous sulfate  325 mg Oral BID   HYDROcodone-acetaminophen  1 tablet Oral TID   mometasone-formoterol  2 puff Inhalation BID   montelukast  10 mg Oral Daily   pantoprazole  40 mg Oral Daily   revefenacin  175 mcg Nebulization Daily   tobramycin (PF)  300 mg Nebulization BID   vitamin C  250 mg Oral Daily   Continuous Infusions:  meropenem (MERREM) IV       LOS: 9 days    Time spent: 25 mins    Shawna Clamp, MD [facility]   If 7PM-7AM, please contact night-coverage

## 2021-03-03 NOTE — Care Management Important Message (Signed)
Important Message  Patient Details IM Letter placed in Patients room. Name: [name] MRN: [id number] Date of Birth: [birth year]   Medicare Important Message Given:  Yes     Kerin Salen 03/03/2021, 12:14 PM

## 2021-03-03 NOTE — Progress Notes (Signed)
NAME:  [name], MRN:  [id number], DOB:  23-Jun-1955, LOS: 9 ADMISSION DATE:  02/21/2021, CONSULTATION DATE:  12/6 REFERRING MD:  Posey Pronto, CHIEF COMPLAINT:  dyspnea, hemoptysis   History of Present Illness:  65 y/o male with a complex history of bronchiectasis with chronic colonization of pseudomonas presented with worsening dyspnea despite treatment with cipro as an outpatient.  He developed hemoptysis 5 days into therapy and pulmonary was consulted.   Pertinent  Medical History  Bronchiectasis S/p RML and RLL lobectomy in years past due to pulmonary infections Kidney stones Hypertension OSA GERD PONV Prostate cancer  Significant Hospital Events: Including procedures, antibiotic start and stop dates in addition to other pertinent events   12/2 admit to [facility], started on ceftaz 12/6 worsening hemoptysis, given TXA neb 12/6 CT chest > RUL with severe bronchiectasis, cavitary changes; diffuse bronchiectasis bilaterally, s/p RML and RLL lobectomy; scattered nodules Changed incruse to yupelri 12/9   Micro 12/2 sputum culture> pseudomonas>>> s to meropenem 12/2 BC x 2 neg  12/3 AFB neg smear >>> 12/8 fungus culture >  12/8 fugitell >  12/8 aspergillus Ag (galactomannan) >   Abx Torbramycin neb 12/2 >>> 12/2 fortaz 12/6 12/6 meropenem >  12/6 zmax>>>   Scheduled Meds:  albuterol  3 mL Inhalation BID   atorvastatin  10 mg Oral Daily   azithromycin  250 mg Oral Daily   calcium carbonate  1,250 mg Oral BID WC   cholecalciferol  2,000 Units Oral Daily   dextromethorphan  30 mg Oral BID   diltiazem  120 mg Oral Daily   dornase alfa  2.5 mg Nebulization Daily   doxylamine (Sleep)  25 mg Oral QHS   feeding supplement  237 mL Oral BID BM   ferrous sulfate  325 mg Oral BID   HYDROcodone-acetaminophen  1 tablet Oral TID   mometasone-formoterol  2 puff Inhalation BID   montelukast  10 mg Oral Daily   pantoprazole  40 mg Oral Daily   revefenacin  175 mcg Nebulization Daily    tobramycin (PF)  300 mg Nebulization BID   vitamin C  250 mg Oral Daily   Continuous Infusions:  cefTAZidime (FORTAZ)  IV     PRN Meds:.chlorpheniramine-HYDROcodone    Interim History / Subjective:   Cough, w/ hourly sputum production Minimal to no blood present   Objective   Blood pressure 114/74, pulse 76, temperature 98.1 F (36.7 C), resp. rate 18, height 5\' 7"  (1.702 m), weight 79.8 kg, SpO2 94 %.        Intake/Output Summary (Last 24 hours) at 03/03/2021 1229 Last data filed at 03/02/2021 1320 Gross per 24 hour  Intake 240 ml  Output --  Net 240 ml   Filed Weights   02/21/21 1238  Weight: 79.8 kg    Examination: General appearance: 65 y.o., male, NAD, conversant  Eyes: anicteric sclerae, moist conjunctivae; no lid-lag; PERRLA, tracking appropriately HENT: NCAT; Lungs: BL rhonchi  CV: RRR, S1, S2 Abdomen: Soft, non-tender; non-distended, BS present  Extremities: No peripheral edema, radial and DP pulses present bilaterally  Neuro: Alert and oriented to person and place, no focal deficit      Resolved Hospital Problem list     Assessment & Plan:   Acute exacerbation of bronchiectasis due to pseudomonas Chronic pseudomonas colonization Hemoptysis> recurrent again 12/8, non-massive, better since then Severe destruction of right upper lobe due to chronic infection   Plan Fungal cx pending, fungitell negative  Meropenem X 21 days today  PICC line prior to DC  Observe for any additional hemoptysis  Both once he has a PICC line I think he can discharge  Already has follow up with Dr. Olena Heckle at [facility] 03/18/2021 Dc with home regimen of tobi, azithro, vest , dornase, dulera, protein shakes and continue to progress mobility.    Best Practice (right click and "Reselect all SmartList Selections" daily)   Per [facility]  Labs   CBC: Recent Labs  Lab 02/24/21 1411 02/25/21 1817 02/27/21 0419  WBC 7.2 13.0* 7.3  NEUTROABS 4.8 10.4*  --   HGB 14.8  15.1 13.9  HCT 45.4 44.7 43.1  MCV 92.7 91.0 95.1  PLT 204 201 594    Basic Metabolic Panel: Recent Labs  Lab 02/24/21 1411 02/27/21 0419  NA 137 136  K 4.2 3.8  CL 98 100  CO2 27 29  GLUCOSE 135* 101*  BUN 23 31*  CREATININE 0.96 0.93  CALCIUM 9.4 8.7*  MG  --  2.0  PHOS  --  2.9   GFR: Estimated Creatinine Clearance: 79.1 mL/min (by C-G formula based on SCr of 0.93 mg/dL). Recent Labs  Lab 02/24/21 1411 02/25/21 1817 02/27/21 0419  WBC 7.2 13.0* 7.3    Liver Function Tests: Recent Labs  Lab 02/24/21 1411  AST 24  ALT 25  ALKPHOS 84  BILITOT 0.6  PROT 7.8  ALBUMIN 3.8   No results for input(s): LIPASE, AMYLASE in the last 168 hours. No results for input(s): AMMONIA in the last 168 hours.  ABG    Component Value Date/Time   PHART 7.377 12/06/2010 0508   PCO2ART 47.4 (H) 12/06/2010 0508   PO2ART 85.0 12/06/2010 0508   HCO3 24.2 05/23/2018 1545   TCO2 27 12/06/2010 2022   ACIDBASEDEF 0.2 05/23/2018 1545   O2SAT 77.9 05/23/2018 1545     Coagulation Profile: Recent Labs  Lab 02/25/21 1817  INR 0.9    Cardiac Enzymes: No results for input(s): CKTOTAL, CKMB, CKMBINDEX, TROPONINI in the last 168 hours.  HbA1C: No results found for: HGBA1C  CBG: No results for input(s): GLUCAP in the last 168 hours.     Garner Nash, DO [facility] 03/03/2021 12:29 PM

## 2021-03-03 NOTE — Progress Notes (Signed)
PHARMACY CONSULT NOTE FOR:  OUTPATIENT  PARENTERAL ANTIBIOTIC THERAPY (OPAT)  Indication: Pseudomonas bronchiectasis  Regimen: Meropenem 1 gm IV Q 8 hours End date: 03/17/21 (21 days total meropenem)  IV antibiotic discharge orders are pended. To discharging provider:  please sign these orders via discharge navigator,  Select New Orders & click on the button choice - Manage This Unsigned Work.     Thank you for allowing pharmacy to be a part of this patient's care.  Curtis Footman, PharmD, BCPS, BCIDP Infectious Diseases Clinical Pharmacist Phone: [phone] 03/03/2021, 1:30 PM

## 2021-03-04 DIAGNOSIS — J9621 Acute and chronic respiratory failure with hypoxia: Secondary | ICD-10-CM | POA: Diagnosis not present

## 2021-03-04 MED ORDER — CHLORHEXIDINE GLUCONATE CLOTH 2 % EX PADS: 6.0000 | MEDICATED_PAD | Freq: Every day | CUTANEOUS | Status: DC

## 2021-03-04 MED ORDER — MEROPENEM IV (FOR PTA / DISCHARGE USE ONLY): 1.0000 g | Freq: Three times a day (TID) | INTRAVENOUS | 0 refills | Status: AC

## 2021-03-04 MED ORDER — SODIUM CHLORIDE 0.9% FLUSH: 10.0000 mL | Freq: Two times a day (BID) | INTRAVENOUS | Status: DC

## 2021-03-04 MED ORDER — SODIUM CHLORIDE 0.9% FLUSH
10.0000 mL | INTRAVENOUS | Status: DC | PRN
  Administered 2021-03-04: 10 mL

## 2021-03-04 MED ORDER — HEPARIN SOD (PORK) LOCK FLUSH 100 UNIT/ML IV SOLN
250.0000 [IU] | INTRAVENOUS | Status: AC | PRN
  Administered 2021-03-04: 250 [IU]
  Filled 2021-03-04: qty 2.5

## 2021-03-04 NOTE — Plan of Care (Signed)
°  Problem: Education: Goal: Knowledge of General Education information will improve Description: Including pain rating scale, medication(s)/side effects and non-pharmacologic comfort measures 03/04/2021 1346 by Zadie Rhine, RN Outcome: Adequate for Discharge 03/04/2021 1345 by Zadie Rhine, RN Outcome: Progressing   Problem: Health Behavior/Discharge Planning: Goal: Ability to manage health-related needs will improve 03/04/2021 1346 by Zadie Rhine, RN Outcome: Adequate for Discharge 03/04/2021 1345 by Zadie Rhine, RN Outcome: Progressing   Problem: Clinical Measurements: Goal: Ability to maintain clinical measurements within normal limits will improve 03/04/2021 1346 by Zadie Rhine, RN Outcome: Adequate for Discharge 03/04/2021 1345 by Zadie Rhine, RN Outcome: Progressing Goal: Will remain free from infection 03/04/2021 1346 by Zadie Rhine, RN Outcome: Adequate for Discharge 03/04/2021 1345 by Zadie Rhine, RN Outcome: Progressing Goal: Diagnostic test results will improve Outcome: Adequate for Discharge Goal: Respiratory complications will improve Outcome: Adequate for Discharge Goal: Cardiovascular complication will be avoided Outcome: Adequate for Discharge   Problem: Activity: Goal: Risk for activity intolerance will decrease Outcome: Adequate for Discharge   Problem: Nutrition: Goal: Adequate nutrition will be maintained Outcome: Adequate for Discharge   Problem: Coping: Goal: Level of anxiety will decrease Outcome: Adequate for Discharge   Problem: Elimination: Goal: Will not experience complications related to bowel motility Outcome: Adequate for Discharge Goal: Will not experience complications related to urinary retention Outcome: Adequate for Discharge   Problem: Pain Managment: Goal: General experience of comfort will improve Outcome: Adequate for Discharge   Problem: Safety: Goal: Ability to remain free  from injury will improve Outcome: Adequate for Discharge   Problem: Skin Integrity: Goal: Risk for impaired skin integrity will decrease Outcome: Adequate for Discharge

## 2021-03-04 NOTE — Plan of Care (Signed)

## 2021-03-04 NOTE — Discharge Instructions (Signed)
Advised to follow-up with primary care physician in 1 week. Advised to follow-up with Dr. Olena Heckle at [facility] pulmonology as scheduled. Patient is being discharged home with a PICC line. Advised to continue imipenem for 21 days. Home health services been arranged.

## 2021-03-04 NOTE — TOC Progression Note (Signed)
Transition of Care [facility]) - Progression Note    Patient Details  Name: [name] MRN: [id number] Date of Birth: May 22, 1955  [facility]) CM/SW Contact  Purcell Mouton, RN Phone Number: 03/04/2021, 12:56 PM  Clinical Narrative:    [facility] will follow pt for IV ABX.    Expected Discharge Plan: Home/Self Care Barriers to Discharge: No Barriers Identified  Expected Discharge Plan and Services Expected Discharge Plan: Home/Self Care       Living arrangements for the past 2 months: Single Family Home                                       Social Determinants of Health (SDOH) Interventions    Readmission Risk Interventions No flowsheet data found.

## 2021-03-04 NOTE — Discharge Summary (Addendum)
Physician Discharge Summary  [name] YJE:[id number] DOB: 10/30/1955 DOA: 02/21/2021  PCP: Aura Dials, MD  Admit date: 02/21/2021  Discharge date: 03/04/2021  Admitted From: Home.  Disposition:  Home.  Recommendations for Outpatient Follow-up:  Follow up with PCP in 1-2 weeks. Please obtain BMP/CBC in one week. Advised to follow-up with Dr. Olena Heckle at [facility] pulmonology as scheduled. Patient is being discharged home with  PICC line. Advised to continue meropenem for 14 days. Home health services been arranged.  Home Health:Home Health RN Equipment/Devices:Home Oxygen Therapy  Discharge Condition: Stable CODE STATUS:Full code Diet recommendation: Heart Healthy   Brief Summary / Hospital Course: This 65 years old male with PMH significant for bronchiectasis with chronic colonization of Pseudomonas, kidney stones, hypertension, OSA, GERD, prostate cancer, s/p right middle and right lower lobe lobectomy due to pulmonary infections presented with worsening shortness of breath despite treatment with ciprofloxacin as an outpatient. He has developed hemoptysis 5 days while being on antibiotics.  Patient was admitted for hemoptysis , Pulmonology consulted, Hemoptysis had improved with some adjustment in medications,  there was no plan for bronchoscopy at this time.  Patient was closely observed throughout the hospital course.  Patient was at high risk for life-threatening hemoptysis.  Antibiotics changed from [place] to meropenem.  Bronchoscopy was not attempted since patient was having improvement in hemoptysis.  Pulmonology recommended patient can be discharged on meropenem for 21 days.  Patient has received PICC line,  home health services been arranged.  Patient is being discharged home on imipenem for 21 days.  Patient has appointment with pulmonologist at [facility] Dr. Olena Heckle as scheduled.  He was managed for below problems.  Discharge Diagnoses:  Principal Problem:   Bronchiectasis  with acute exacerbation ([place]) Active Problems:   Malignant neoplasm of prostate (HCC)   History of PSVT (paroxysmal supraventricular tachycardia)   HLD (hyperlipidemia)   Chronic pain   Cough with hemoptysis   Failure to thrive in adult   Acute on chronic respiratory failure with hypoxia (HCC)  Bronchiectasis with acute exacerbation: Patient has chronic bronchiectasis with chronic Pseudomonas colonization. Patient has been following up with Dr. Olena Heckle at [facility]( primary pulmonologist). Patient at baseline uses 2 L of supplemental oxygen at night and on exertion. Pulmonology recommended to continue IV meropenum, tobramycin nebs and continue saline nebulization, albuterol, Spiriva, Symbicort and Pulmozyme. Sputum culture growing Pseudomonas, sensitive to Fortaz and, meropenem. AFB cultures also sent results pending.   Hemoptysis: > Improved. Patient initially had some streaking of blood with mucus. The patient has worsening blood in the sputum. Pulmonology consulted. Medications adjusted.   Patient has received tranexamic acid nebulizer therapy IV antibiotic changed from [place] to meropenem. He needs to be treated in hospital until hemoptysis resolves. He is at risk for massive, life-threatening hemoptysis. Continue meropenem, total 21-day course. Obtain Fungitell galactomannan and fungus culture. If he still has hemoptysis by 12/12 pulmonology considers bronchoscopy and plan for IR guided embolization. Patient will need PICC line and 21 days of IV meropenem that needs to be arranged.   Chronic pain: Continue Norco   History of paroxysmal SVT: Continue Cardizem. HR controlled.   Hyperlipidemia: Continue statin   Prior history of prostate cancer: Stable.   Failure to thrive in adult: Secondary to ongoing recurrent infection,  bronchiectasis at baseline. Improved.  Discharge Instructions  Discharge Instructions     [facility] pharmacist to adjust dose for  Vancomycin, Aminoglycosides and other anti-infective therapies as requested by physician.   Complete by: As  directed    [facility] to provide Cath Flo 214m   Complete by: As directed    Administer for PICC line occlusion and as ordered by physician for other access device issues.   Anaphylaxis Kit: Provided to treat any anaphylactic reaction to the medication being provided to the patient if First Dose or when requested by physician   Complete by: As directed    Epinephrine 174mml vial / amp: Administer 0.14m56m0.14ml45mubcutaneously once for moderate to severe anaphylaxis, nurse to call physician and pharmacy when reaction occurs and call 911 if needed for immediate care   Diphenhydramine 50mg70mIV vial: Administer 25-50mg 106mM PRN for first dose reaction, rash, itching, mild reaction, nurse to call physician and pharmacy when reaction occurs   Sodium Chloride 0.9% NS 500ml I514mdminister if needed for hypovolemic blood pressure drop or as ordered by physician after call to physician with anaphylactic reaction   Call MD for:  difficulty breathing, headache or visual disturbances   Complete by: As directed    Call MD for:  persistant dizziness or light-headedness   Complete by: As directed    Call MD for:  persistant nausea and vomiting   Complete by: As directed    Change dressing on IV access line weekly and PRN   Complete by: As directed    Diet - low sodium heart healthy   Complete by: As directed    Diet Carb Modified   Complete by: As directed    Discharge instructions   Complete by: As directed    Advised to follow-up with primary care physician in 1 week. [facility] Patient is being discharged home with a PICC line. Advised to continue imipenem for 21 days. Home health services been arranged.   Discharge wound care:   Complete by: As directed    Follow-up with PCP in 1 week.   Flush IV access with Sodium  Chloride 0.9% and Heparin 10 units/ml or 100 units/ml   Complete by: As directed    Home infusion instructions - [facility]   Complete by: As directed    Instructions: Flush IV access with Sodium Chloride 0.9% and Heparin 10units/ml or 100units/ml   Change dressing on IV access line: Weekly and PRN   Instructions Cath Flo 2mg: Ad51mister for PICC Line occlusion and as ordered by physician for other access device   [facility] pharmacist to adjust dose for: Vancomycin, Aminoglycosides and other anti-infective therapies as requested by physician   Increase activity slowly   Complete by: As directed    Method of administration may be changed at the discretion of home infusion pharmacist based upon assessment of the patient and/or caregivers ability to self-administer the medication ordered   Complete by: As directed       Allergies as of 03/04/2021       Reactions   Morphine Nausea And Vomiting   Morphine And Related Nausea And Vomiting   Tobramycin Nausea And Vomiting, Other (See Comments)   Ototoxicity, when administered in IV  02/22/21 pt reports no allergy/intolerance to current tobramycin nebulizing soln   Gentamicin Other (See Comments)   BALANCE ISSUES        Medication List     TAKE these medications    acidophilus Caps capsule Take 2 capsules by mouth daily.   albuterol (2.5 MG/3ML) 0.083% nebulizer solution Commonly known as: PROVENTIL Inhale 2.5 mg into the lungs 4 (four)  times daily.   albuterol 108 (90 Base) MCG/ACT inhaler Commonly known as: VENTOLIN HFA Inhale 2 puffs into the lungs every 4 (four) hours as needed for wheezing or shortness of breath.   alendronate 70 MG tablet Commonly known as: FOSAMAX Take 70 mg by mouth once a week.   ascorbic acid 250 MG Chew Commonly known as: VITAMIN C Chew 1,000 mg by mouth daily.   atorvastatin 10 MG tablet Commonly known as: LIPITOR Take 10 mg by mouth daily.   azithromycin 250 MG  tablet Commonly known as: ZITHROMAX Take 1 tablet (250 mg total) by mouth daily.   budesonide-formoterol 160-4.5 MCG/ACT inhaler Commonly known as: SYMBICORT Inhale 2 puffs into the lungs 2 (two) times daily.   calcium carbonate 600 MG Tabs tablet Commonly known as: OS-CAL Take 600 mg by mouth 2 (two) times daily with a meal.   Cholecalciferol 50 MCG (2000 UT) Tabs Take 2,000 Units by mouth daily.   Coenzyme Q10 300 MG Caps Take 1 capsule by mouth daily.   Colistimethate Sodium Powd Take 2 mLs by nebulization 2 (two) times daily. Use only every other month. 175m injection.   diltiazem 120 MG 24 hr capsule Commonly known as: CARDIZEM CD Take 1 capsule (120 mg total) by mouth daily.   dornase alfa 2.5 MG/2.5ML nebulizer solution Commonly known as: PULMOZYME Take 2.5 mg by nebulization daily. Use at least 30-60 minutes before airway clearance or after airway clearance   doxylamine (Sleep) 25 MG tablet Commonly known as: UNISOM Take 25 mg by mouth at bedtime.   ferrous sulfate 325 (65 FE) MG tablet Take 325 mg by mouth 2 (two) times daily.   fluticasone 50 MCG/ACT nasal spray Commonly known as: FLONASE Place 1 spray into both nostrils daily.   Flutter Devi Use several times daily as needed for congestion/thick mucus   HYDROcodone-acetaminophen 7.5-325 MG tablet Commonly known as: NORCO Take 1 tablet by mouth every 6 (six) hours as needed for moderate pain. What changed: when to take this   meropenem  IVPB Commonly known as: MERREM Inject 1 g into the vein every 8 (eight) hours for 14 days. Indication:  Pseudomonas bronchiectasis First Dose: Yes Last Day of Therapy:  03/17/21 Labs - Once weekly:  CBC/D and BMP, Labs - Every other week:  ESR and CRP Method of administration: Mini-Bag Plus / Gravity Method of administration may be changed at the discretion of home infusion pharmacist based upon assessment of the patient and/or caregiver's ability to self-administer  the medication ordered.   montelukast 10 MG tablet Commonly known as: SINGULAIR Take 1 tablet by mouth daily.   multivitamin with minerals Tabs tablet Take 1 tablet by mouth daily.   NON FORMULARY Use Acapella twice daily for airway clearance. Dx. 494.0. Mucous clearing device.   omeprazole 20 MG capsule Commonly known as: PRILOSEC Take 20 mg by mouth 2 (two) times daily.   Sodium Chloride 10 % Nebu Inhale 5 mLs into the lungs 2 (two) times daily.   Spiriva Respimat 2.5 MCG/ACT Aers Generic drug: Tiotropium Bromide Monohydrate Inhale 1 puff into the lungs in the morning and at bedtime.   sterile water for irrigation 4 mLs daily. Mix 43mwith colistimethate powder to use for nebulization. 28 days on and 28 days off.   tobramycin (PF) 300 MG/5ML nebulizer solution Commonly known as: [name] Take 300 mg by nebulization 2 (two) times daily. Use only every other month   zinc gluconate 50 MG tablet Take 50 mg by  mouth daily.               Discharge Care Instructions  (From admission, onward)           Start     Ordered   03/04/21 0000  Change dressing on IV access line weekly and PRN  (Home infusion instructions - [facility] )        03/04/21 1300   03/04/21 0000  Discharge wound care:       Comments: Follow-up with PCP in 1 week.   03/04/21 1312            Follow-up Information     [facility] Follow up.   Why: [facility] for IV Antibiotics. Please call [facility] for any questions [phone]        Aura Dials, MD Follow up in 1 week(s).   Specialty: Family Medicine Contact information: [place] [address] [phone]                Allergies  Allergen Reactions   Morphine Nausea And Vomiting   Morphine And Related Nausea And Vomiting   Tobramycin Nausea And Vomiting and Other (See Comments)    Ototoxicity, when administered in IV  02/22/21 pt reports no allergy/intolerance to current  tobramycin nebulizing soln   Gentamicin Other (See Comments)    BALANCE ISSUES    Consultations: Pulmonology   Procedures/Studies: CT CHEST WO CONTRAST  Result Date: 02/25/2021 CLINICAL DATA:  Hemoptysis.  Shortness of breath. EXAM: CT CHEST WITHOUT CONTRAST TECHNIQUE: Multidetector CT imaging of the chest was performed following the standard protocol without IV contrast. COMPARISON:  CT chest 03/31/2016 FINDINGS: Cardiovascular: Normal heart size. No significant pericardial effusion. The thoracic aorta is normal in caliber. Mild atherosclerotic plaque of the thoracic aorta. At least 3 vessel coronary artery calcifications. Mediastinum/Nodes: No gross hilar adenopathy, noting limited sensitivity for the detection of hilar adenopathy on this noncontrast study. No enlarged mediastinal or axillary lymph nodes. Thyroid gland, trachea, and esophagus demonstrate no significant findings. Small hiatal hernia. Lungs/Pleura: Surgical changes related to a right middle and lower lobectomy again noted. Interval development marked cystic and varicoid bronchiectasis in the right upper lobe with largest caliber 5.1 cm. Superimposed increased right upper lobe consolidation. Interval development of diffuse varicoid left lung bronchiectasis. 4 mm left lower lobe pulmonary nodule. Couple punctate pulmonary nodules within the left lower lobe. The other scattered micronodules within right upper lobe. No pneumothorax. No pleural effusion. Upper Abdomen: 2 mm calcification within the right kidney. No acute abnormality. Musculoskeletal: No chest wall abnormality. No suspicious lytic or blastic osseous lesions. No acute displaced fracture. Multilevel degenerative changes of the spine. IMPRESSION: 1. Interval development of varicoid bronchiectasis in the right upper lobe measuring up to 5 cm. Superimposed increased right upper lobe consolidation that could represent a combination of infection/inflammation with markedly limited  evaluation on this noncontrast study. 2. Interval development of diffuse varicoid left lung bronchiectasis. 3. Several nonspecific scattered pulmonary micronodules within the bilateral lungs. 4. Status post right middle and lower lobe lobectomy. Electronically Signed   By: Iven Finn M.D.   On: 02/25/2021 19:04   DG Chest Portable 1 View  Result Date: 02/21/2021 CLINICAL DATA:  COVID-19. Shortness of breath. Right lower and middle lobe resection 7 years ago secondary to infection. EXAM: PORTABLE CHEST 1 VIEW COMPARISON:  10/05/2019 FINDINGS: Numerous leads and wires project over the chest. Similar tracheal deviation to the right secondary to volume loss  in the right hemithorax. Pleuroparenchymal opacity throughout a majority of the right hemithorax is similar, consistent with the clinical history of partial pneumonectomy secondary to chronic infection. No left-sided pleural effusion. No pneumothorax. Diffuse pulmonary interstitial thickening, without left-sided lobar consolidation. IMPRESSION: Similar appearance of volume loss and partial opacification of the right hemithorax, likely due to surgical changes and chronic post infectious scarring. No evidence of left-sided pneumonia. Electronically Signed   By: Abigail Miyamoto M.D.   On: 02/21/2021 14:06   [place] EKG SITE RITE  Result Date: 03/03/2021 If Site Rite image not attached, placement could not be confirmed due to current cardiac rhythm.     Subjective: Patient was seen and examined at bedside.  Overnight events noted.  Patient reports feeling much improved.   Blood in the phlegm has resolved.  Patient wants to be discharged.  Patient has received PICC line. Home and services been arranged.  Discharge Exam: Vitals:   03/04/21 0722 03/04/21 1417  BP:  123/80  Pulse:  83  Resp:  18  Temp:  98.3 F (36.8 C)  SpO2: 99% 94%   Vitals:   03/03/21 2128 03/04/21 0416 03/04/21 0722 03/04/21 1417  BP:  (!) 102/54  123/80  Pulse:  70  83   Resp:  16  18  Temp:  98.1 F (36.7 C)  98.3 F (36.8 C)  TempSrc:    Oral  SpO2: 94% 98% 99% 94%  Weight:      Height:        General: Pt is alert, awake, not in acute distress. Cardiovascular: RRR, S1/S2 +, no rubs, no gallops Respiratory: CTA bilaterally, no wheezing, no rhonchi Abdominal: Soft, NT, ND, bowel sounds + Extremities: no edema, no cyanosis    The results of significant diagnostics from this hospitalization (including imaging, microbiology, ancillary and laboratory) are listed below for reference.     Microbiology: No results found for this or any previous visit (from the past 240 hour(s)).   Labs: BNP (last 3 results) No results for input(s): BNP in the last 8760 hours. Basic Metabolic Panel: Recent Labs  Lab 02/27/21 0419  NA 136  K 3.8  CL 100  CO2 29  GLUCOSE 101*  BUN 31*  CREATININE 0.93  CALCIUM 8.7*  MG 2.0  PHOS 2.9   Liver Function Tests: No results for input(s): AST, ALT, ALKPHOS, BILITOT, PROT, ALBUMIN in the last 168 hours. No results for input(s): LIPASE, AMYLASE in the last 168 hours. No results for input(s): AMMONIA in the last 168 hours. CBC: Recent Labs  Lab 02/25/21 1817 02/27/21 0419  WBC 13.0* 7.3  NEUTROABS 10.4*  --   HGB 15.1 13.9  HCT 44.7 43.1  MCV 91.0 95.1  PLT 201 160   Cardiac Enzymes: No results for input(s): CKTOTAL, CKMB, CKMBINDEX, TROPONINI in the last 168 hours. BNP: Invalid input(s): POCBNP CBG: No results for input(s): GLUCAP in the last 168 hours. D-Dimer No results for input(s): DDIMER in the last 72 hours. Hgb A1c No results for input(s): HGBA1C in the last 72 hours. Lipid Profile No results for input(s): CHOL, HDL, LDLCALC, TRIG, CHOLHDL, LDLDIRECT in the last 72 hours. Thyroid function studies No results for input(s): TSH, T4TOTAL, T3FREE, THYROIDAB in the last 72 hours.  Invalid input(s): FREET3 Anemia work up No results for input(s): VITAMINB12, FOLATE, FERRITIN, TIBC, IRON,  RETICCTPCT in the last 72 hours. Urinalysis    Component Value Date/Time   COLORURINE YELLOW 10/06/2019 [place] 10/06/2019 0556  LABSPEC 1.028 10/06/2019 0556   PHURINE 5.0 10/06/2019 0556   GLUCOSEU NEGATIVE 10/06/2019 0556   HGBUR NEGATIVE 10/06/2019 0556   BILIRUBINUR NEGATIVE 10/06/2019 0556   KETONESUR NEGATIVE 10/06/2019 0556   PROTEINUR NEGATIVE 10/06/2019 0556   UROBILINOGEN 0.2 12/03/2010 1212   NITRITE NEGATIVE 10/06/2019 0556   LEUKOCYTESUR NEGATIVE 10/06/2019 0556   Sepsis Labs Invalid input(s): PROCALCITONIN,  WBC,  LACTICIDVEN Microbiology No results found for this or any previous visit (from the past 240 hour(s)).   Time coordinating discharge: Over 30 minutes  SIGNED:   Shawna Clamp, MD  [facility] 03/04/2021, 2:56 PM Pager   If 7PM-7AM, please contact night-coverage

## 2021-03-04 NOTE — Progress Notes (Signed)
Discharged to home instruction reviewed with pt, pt acknowledged understanding instructions and home health care. SRP RN.

## 2021-03-04 NOTE — Progress Notes (Signed)
Peripherally Inserted Central Catheter Placement  The IV Nurse has discussed with the patient and/or persons authorized to consent for the patient, the purpose of this procedure and the potential benefits and risks involved with this procedure.  The benefits include less needle sticks, lab draws from the catheter, and the patient may be discharged home with the catheter. Risks include, but not limited to, infection, bleeding, blood clot (thrombus formation), and puncture of an artery; nerve damage and irregular heartbeat and possibility to perform a PICC exchange if needed/ordered by physician.  Alternatives to this procedure were also discussed.  Bard Power PICC patient education guide, fact sheet on infection prevention and patient information card has been provided to patient /or left at bedside.    PICC Placement Documentation  PICC Single Lumen 03/04/21 Left Brachial 46 cm 0 cm (Active)  Indication for Insertion or Continuance of Line Home intravenous therapies (PICC only) 03/04/21 1234  Exposed Catheter (cm) 0 cm 03/04/21 1234  Site Assessment Clean;Dry;Intact 03/04/21 1234  Line Status Flushed;Blood return noted;Saline locked 03/04/21 1234  Dressing Type Transparent 03/04/21 1234  Dressing Status Clean;Dry;Intact 03/04/21 1234  Antimicrobial disc in place? Yes 03/04/21 1234  Dressing Change Due 03/11/21 03/04/21 1234       Scotty Court 03/04/2021, 12:37 PM

## 2021-03-05 DIAGNOSIS — J9621 Acute and chronic respiratory failure with hypoxia: Secondary | ICD-10-CM | POA: Diagnosis not present

## 2021-03-06 LAB — ASPERGILLUS ANTIGEN, BAL/SERUM

## 2021-03-06 NOTE — Unmapped (Signed)
Called patient to verify some information about home IV antibiotics; PICC line- IV Meropenem TID through [facility]-  self-administered & teaching provided prior to discharge to home  12/14 [facility] home health RN came to home & will plan to come Mon to draw blood.    Spoke with [facility] who provided the following:  ID is following. CMCdiff, BMP weekly, sed rate CRP every other week (normally Mon or Tues). Labs will likely be sent to [facility], but I have asked to be added to the results fax, along with the home infusion company and the Infectious Disease folks who are following.  Routing to CPP & MD for awareness.

## 2021-03-07 MED ORDER — SYRINGE WITH NEEDLE 3 ML 20 GAUGE X 1 1/2"
30 refills | 0 days | Status: CP
Start: 2021-03-07 — End: ?

## 2021-03-10 DIAGNOSIS — J9690 Respiratory failure, unspecified, unspecified whether with hypoxia or hypercapnia: Secondary | ICD-10-CM | POA: Diagnosis not present

## 2021-03-10 DIAGNOSIS — J9621 Acute and chronic respiratory failure with hypoxia: Secondary | ICD-10-CM | POA: Diagnosis not present

## 2021-03-10 DIAGNOSIS — B371 Pulmonary candidiasis: Secondary | ICD-10-CM | POA: Diagnosis not present

## 2021-03-11 ENCOUNTER — Ambulatory Visit: Admit: 2021-03-11 | Discharge: 2021-03-12 | Payer: MEDICARE

## 2021-03-11 DIAGNOSIS — J479 Bronchiectasis, uncomplicated: Principal | ICD-10-CM

## 2021-03-12 DIAGNOSIS — E440 Moderate protein-calorie malnutrition: Secondary | ICD-10-CM | POA: Diagnosis not present

## 2021-03-12 DIAGNOSIS — J471 Bronchiectasis with (acute) exacerbation: Secondary | ICD-10-CM | POA: Diagnosis not present

## 2021-03-17 NOTE — Unmapped (Unsigned)
[facility]    Assessment:      Patient:Jordan [name] (08-22-1955)    [name] is a 65 y.o. male who is seen for follow up of bronchiectasis with chronic Pseudomonas colonization and chronic hypoxemic respiratory failure requiring supplemental oxygen with exertion and sleep. Recent admission to [facility] for worsening dyspnea despite oral antibiotics.  Then developed hemoptysis.     Plan:      Problem List Items Addressed This Visit        Respiratory    Bronchiectasis (CMS-HCC) - Primary    Chronic respiratory failure with hypoxia (CMS-HCC)   Therapies:  ??? {MLDNEWRTPLAN:82855:p}    Testing:  ??? {MLDSPUTUM:53428}  ??? If he has future exacerbations, sputum should be sent for bacterial and AFB cultures. The bacterial culture should be processed like a cystic fibrosis sample given the overlapping pathogens. There are standing orders for these cultures in our system.    Referrals:  ??? {MLD Referrals:53433}     Vaccinations:  ??? COVID-19: ***  ??? Flu: ***  ??? Pneumococcal: ***  ??? Tdap: ***    Takeru will return to clinic in *** months for follow-up with {MLDRTN:53454}.  He will call or send me a message via MyChart if questions or concerns arise before this visit. Taite is in agreement with the above plan.    Subjective:      HPI: [name] is a 65 y.o. male who is seen for follow up of bronchiectasis with PsA colonization and prior history of multiple lung resections.    09/27/18:  I last saw him on 05/17/18. He reached out at the end of May reporting symptoms c/w an exacerbation. I treated him with a two week course of Ciprofloxacin, which seemed to help.  Reports that he feels winded too much - if walks up incline or up a couple hundred feet. This has worsened over the past year. Has to stop and let lungs catch up.  Of note, he has been off testosterone supplementation for the past year after he was diagnosed with prostate cancer.    He received the [facility], which he is using with nebulized HTS 7% and Pulmozyme. After using the Vest for 6-8 weeks, he noticed improvement with more phlegm mobilization. Sometimes has to pause it to cough to get stuff up.  Plugging feeling happening a lot more, especially with the vest.  Most of the time, sputum is thinned out with Pulmozyme but still gets those plugs.    Had to stop eating ice cream or drinking milkshake - will cough will eating and that makes him vomit on occasion. Past few weeks, when eating will cough, no matter what he eats.  Happens almost once a day. Doesn't feel like things get stuck typically.      02/07/19:  Following his last visit, [name] reached out regarding portable oxygen.  We had considered the life 2000 but he does not qualify because he does not use supplemental oxygen.  He performed the 6-minute walk test prior to today's visit but did not desaturate.  He wonders if this is because the test was done in the morning and he doesn't have breathing issues in the morning.  Typically, breathing issues occur in afternoon.  During these episodes, he will have to use his inhaler and feels like he is suffocating.  He describes it as an uncomfortable feeling that limits his ability to do things with family.  If he is having trouble catching  breath, oximetry is 92-93%. Has seen it in the 80s if comes in from outside and is short of breath.    For airway clearance, he is nebulizing 10% hypertonic saline and using his percussive vest for 20 minute sessions in AM and PM. Has to pause it to cough and get stuff up.  Coughing up more stuff since last visit and is waking up 1-2 times a night with cough.having coughing fits between clearance session.  What he expectorates is less of a mucous plug and mostly stringy sputum that is yellowish to light brown in color.  No hemoptysis.  On Saturday, he had a coughing fit that nearly triggered posttussive emesis.  He only expectorated about 3-4 globs of phlegm.  Nebulizing Pulmozyme daily.  Alternates [name] and colistin every other month.  On [name] this month and will start Colistin on Jordan [name] 1.    06/06/19:  Coughing more and more sputum.  Waking up 1-2 times per night coughing. Can cough once or just keep coughing.  Sputum is yellow in color without evidence of blood.  He is not expectorating green sputum currently. On colistin this month; alternates with [name]. Went through January off all inhaled antibiotics.  Oxygen levels in the low 90s.  Uses rescue inhaler if goes up stairs.  Regularly performing airway clearance with nebulized 10% hypertonic saline twice a day, percussive vest twice a day, and Pulmozyme daily. He last received IV antibiotics in January 2019 but has been treated with oral antibiotics on 04/19/18 and 08/17/18, 03/10/19.      11/10/19:  1-2 times per week can get sharp right sided chest pain - even with nipple line. Can happen with clearance or activity or rest. Never during sleep. Following his hospitalization, he first experienced it in June. Lasts only about 10 seconds. Not reproducible. Limited activity due to dyspnea. Regularly performing airway clearance with nebulized 10% hypertonic saline twice a day, percussive vest twice a day, and Pulmozyme daily. Remains on chronic azithromycin therapy.    02/13/20:  Showed up to [facility] with oxygen saturations of 87-88% after walking from parking lot (<100 yd) and up incline without supplemental oxygen.  Using 2L continuous flow with exertion and sleep.  Had sleep study done at [facility] on RA and was told that he should be fine with just the supplemental oxygen. I do not have this report. Using oxygen when goes to football game; needs long period to recover from activity.  Going up 20 foot incline with oxygen, will have to stop and catch breath.     Wheezing more lately and endorses chest tightness. Has need to use albuterol as rescue 3-4 times per day.  Used albuterol in the car on the way here. Typically would be able to cough up large amount of mucus after PFTs but only coughed up small amount. Feels like mucus is stuck in lungs despite twice daily airway clearance with 10% HTS and Vest. Also using Pulmozyme once a day and Symbicort twice a day after clearance.  Using colistin this month. He ordered the [name] nebs to use next month even though insurance won't cover it. Feels like inhaled antibiotics definitely help.    05/10/20:   Saw [facility] for lung transplant evaluation.  Felt to be poor candidate due to prior surgeries and primary disease.  Candidacy closed at [facility]. Referral sent to [facility] and [place] on 2/15.  Based on labs and testing done there, getting Hep B vaccination series (not Hep  A or Hep B immune).  Also getting DEXA that was ordered by Dr. Abigail Miyamoto.    Had PSG done at [facility] - no evidence of OSA but does have increased upper airway resistance.  He never sleeps on his back; only sleeps on side or stomach.   Fatigue is most burdensome symptom.    Regrets that he let the surgeon operate on his lung before he met me.  He completed pulmonary rehab at [facility] but they only had him on machine for ~30 minutes and then stretching.  No strength exercises. He is planning on joining a gym based on recommendations from [facility] transplant.  Was surprised that he didn't desaturate during his walk test at [facility].    07/02/20:  At last visit, referred him to [facility] for pulmonary rehab. Found to have osteoporosis in hips. Turned down for transplant at [facility] based on chart review.  No change in breathing or cough.  Every once in a while, has bad coughing spells.  Same sputum volume and color. Globby to stringy. Not dark brown or bloody.  Sputum is light brown to yellowish.  Breathing about the same.  Started on 1200 mg calcium for osteoporosis.  Starting telehealth pulmonary rehab - feels optimistic that it will help him do more.    10/31/20:  Heart monitor for pulm rehab - had 40 events of fast rate or afib.  Has felt heart fluttering.  Did event monitor for 8 days.  Getting echocardiogram on Wednesday and then seeing cardiologist the next week.     Was having sharp pains over right abdomen below ribs.  Once a month, now once every 2 weeks.    03/18/21:  Since his last clinic visit, he was evaluated by cardiology at [facility] (October) and then was admitted to [facility] earlier this month due to hemoptysis that occurred while on ciprofloxacin for an exacerbation.  He also had worsening shortness of breath.  Was discharged home on 21 days of meropenem.  Did not undergo embolization or bronchoscopy.     ***   Respiratory Symptoms:   ??? Cough: productive of dark beige sputum.  Volume has lessened overall.  No chest congestion.  ??? Nocturnal awakenings: absent.  Perhaps once a month.  ??? Wheezing: absent.  ??? Chest tightness: absent.  ??? Rescue albuterol use: Not in months.  ??? Pleurisy: present seldom.  ??? Hemoptysis: ***  ??? MMRC: 1 = I get short of breath when hurrying on level ground or walking up a slight hill.    Exacerbations:   ??? Number of exacerbations treated in the past year: 2  ??? Dates of exacerbations: 03/2018, 07/2018, 02/2019, 09/2019, 12/2019, 10/2020, 01/2021  ??? Number of hospitalizations for exacerbations in the past 2 years: 3  ??? Dates of hospitalizations: 01/2015, 03/2015, 01/2016, 03/2017, 09/2019, 10/2020, 02/2021    Pulmonary Therapies:   ??? Airway clearance: Pulmozyme daily, HTS 10% bid, percussive vest bid  ??? Inhaled antibiotics: Alternates [name] and Colistin.  ??? Inhalers: Symbicort 160/4.5, Spiriva Respimat  ??? Chronic antibiotics: azithromycin 250 mg daily  ??? Exercise: Walking 3 miles at 2.1 without stopping on the treadmill.  ??? Pulmonary Rehab: Telehealth pulmonary rehab July 2022  ??? Supplemental oxygen: 2L continuous and with sleep.  ??? NIPPV: No    Past Medical History:   Diagnosis Date   ??? Abscess of lung (CMS-HCC) 11/06/2010    CT Chest 10/22/10 12/05/2010 right thoracotomy resection of right middle lobe and resection of right  lower lobe abscess ??? Arthritis     back   ??? Biceps tendon tear 2013    left side   ??? Bronchiectasis (CMS-HCC)     chronic psuedomonas infection   ??? Degenerative joint disease of left acromioclavicular joint    ??? GERD (gastroesophageal reflux disease)    ??? Hypertension    ??? Obstructive sleep apnea on CPAP 03/14/13    AHI 33.5, on BiPAP 12/8 based on sleep study 09/2013   ??? Pneumonia 2012    Lung abcess    ??? PONV (postoperative nausea and vomiting)    ??? Prostate cancer (CMS-HCC) 09/09/2017   ??? Rotator cuff injury left   ??? Vertigo        Past Surgical History:   Procedure Laterality Date   ??? APPENDECTOMY  1969   ??? BACK SURGERY     ??? BRONCHOSCOPY  10/19/2010    [facility]   ??? CARPAL TUNNEL RELEASE  1999   ??? LUNG REMOVAL, PARTIAL Right 10/2010    RML and RLL partially resected   ??? MENISCECTOMY  2011   ??? NECK SURGERY  1993, 1999, 2002   ??? PR GERD TST W/ MUCOS IMPEDE ELECTROD,>1HR N/A 07/06/2012    Procedure: ESOPHAGEAL FUNCTION TEST, GASTROESOPHAGEAL REFLUX TEST W/ NASAL CATHETER INTRALUMINAL IMPEDANCE ELECTRODE(S) PLACEMENT, RECORDING, ANALYSIS AND INTERPRETATION; PROLONGED;  Surgeon: None None;  Location: [facility];  Service: Gastroenterology   ??? PR OPEN TREAT RIB FRACTURE W/INT FIXATION, UNILATERAL, 1-2 RIBS Right 03/19/2015    Procedure: OPEN TREATMENT OF RIB FRACTURE REQUIRING INTERNAL FIXATION, UNILATERAL; 1-2 RIBS;  Surgeon: Evert Kohl, MD;  Location: MAIN OR [facility];  Service: Cardiothoracic   ??? PR THORACOTOMY W/THERAP WEDGE RESEXN ADDL IPSILATRL Right 08/07/2013    Procedure: THORACOTOMY; WITH THERAPEUTIC LOBECTOMY OF RIGHT MIDDLE AND RIGHT LOWER LOBE RESECTION , EACH ADDITIONAL RESECTION, IPSILATERAL;  Surgeon: Alvester Chou, MD;  Location: MAIN OR [facility];  Service: Cardiothoracic   ??? TONSILLECTOMY  1965       Family History   Problem Relation Age of Onset   ??? Alzheimer's disease Mother    ??? Stroke Mother    ??? Bronchiectasis  Brother         presumed   ??? Bronchiectasis  Brother         presumed ??? Liver disease Father    ??? Diabetes Father    ??? Heart failure Father    ??? Bronchiectasis  Daughter    ??? Asthma Son         childhood asthma   ??? Clotting disorder Neg Hx    ??? Anesthesia problems Neg Hx        Social History     Tobacco Use   ??? Smoking status: Never   ??? Smokeless tobacco: Never   Substance Use Topics   ??? Alcohol use: No     Alcohol/week: 0.0 standard drinks   ??? Drug use: No       Allergies  Reviewed on 02/11/2021      Reactions Comments    Gentamicin Other (See Comments) BALANCE ISSUES    Tobramycin Other (See Comments) ototoxicity    Morphine Nausea And Vomiting         Current Outpatient Medications   Medication Sig Dispense Refill   ??? albuterol 2.5 mg /3 mL (0.083 %) nebulizer solution INHALE 3 ML (2.5 MG TOTAL) BY NEBULIZATION TWO (2) TIMES A DAY. 525 mL 3   ??? albuterol HFA 90 mcg/actuation inhaler  Inhale 2 puffs Four (4) times a day. (Patient taking differently: Inhale 2 puffs four (4) times a day as needed.) 25.5 g 3   ??? alcohol swabs (ALCOHOL PREP PADS) PadM Use as directed with inhaled antibiotics 100 each 5   ??? ascorbic acid, vitamin C, (VITAMIN C) 1000 MG tablet Take 1,000 mg by mouth daily.     ??? atorvastatin (LIPITOR) 10 MG tablet Take 10 mg by mouth daily.     ??? azithromycin (ZITHROMAX) 250 MG tablet TAKE 1 TABLET BY MOUTH EVERY DAY 90 tablet 3   ??? calcium carbonate (CALCIUM 600 ORAL) Take 1 tablet by mouth daily.     ??? cholecalciferol, vitamin D3-50 mcg, 2,000 unit,, 50 mcg (2,000 unit) cap Take 2,000 Units by mouth daily.     ??? ciprofloxacin HCl (CIPRO) 750 MG tablet Take 1 tablet (750 mg total) by mouth Two (2) times a day. 28 tablet 0   ??? coenzyme Q10 300 mg cap Take 10-300 mg by mouth daily.     ??? colistimethate (COLYMYCIN) 150 mg injection Inject 2 mL sterile water for injection to mix colistin vial, then draw up 2 mL (150mg ) and inhale 2 times a day, 28 days on and 28 days off. 60 each 5   ??? dornase alfa (PULMOZYME) 1 mg/mL nebulizer solution Inhale 1 ampule (2.5 mg) daily. Use at least 30-60 minutes before airway clearance, or after airway clearance 225 mL 3   ??? empty container Misc USE AS DIRECTED 1 each 2   ??? ferrous sulfate 325 (65 FE) MG tablet Take 325 mg by mouth Two (2) times a day. BID     ??? HYDROcodone-acetaminophen (NORCO) 7.5-325 mg per tablet Take 1 tablet by mouth three (3) times a day (at 6am, noon and 6pm).     ??? montelukast (SINGULAIR) 10 mg tablet TAKE 1 TABLET BY MOUTH EVERY DAY AT NIGHT 90 tablet 3   ??? multivitamin-minerals-lutein Tab Take 1 tablet by mouth daily.      ??? nebulizers (LC PLUS) Misc Use as directed with tobramycin 1 each 5   ??? nebulizers (LC PLUS) Misc use with Colistin 1 each 5   ??? nebulizers (LC PLUS) Misc Use as directed with hypertonic saline 10% 3 each 3   ??? needle, disp, 21 G (BD REGULAR BEVEL NEEDLES) 21 gauge x 1 1/2 Ndle Use as directed with inhaled Colistin 100 each 3   ??? omeprazole (PRILOSEC) 20 MG capsule Take 20 mg by mouth Two (2) times a day.      ??? sodium chloride (BD POSIFLUSH NORMAL SALINE 0.9) 0.9 % injection Inject 2mL of 0.9%NaCl into colistin vial & gently mix. After withdrawing colistin dose, add an additional 1mL of 0.9%NaCl to neb cup with the colistin dose. 180 mL 11   ??? sodium chloride 10 % Nebu Inhale 5 mL by nebulization Two (2) times a day. 900 mL 3   ??? sterile water Soln Use 2mL to mix Colistin, then add additional 2mL to neb cup with 2mL of mixed colistin. Inhale twice daily 28 days on and 28 days off. 600 mL 5   ??? SYMBICORT 160-4.5 mcg/actuation inhaler INHALE 2 PUFFS INTO THE LUNGS 2 TIMES A DAY. 30.6 g 1   ??? syringe with needle (BD LUER-LOK SYRINGE) 3 mL 20 gauge x 1 1/2 Syrg For use with inhaled antibiotic (Colistin) 60 each 30   ??? syringe with needle (BD LUER-LOK SYRINGE) 3 mL 20 gauge x 1 Syrg For use with inhaled  antibiotic (Colistin) 60 each 5   ??? tiotropium bromide (SPIRIVA RESPIMAT) 2.5 mcg/actuation inhalation mist Inhale 2 puffs daily. 12 g 3   ??? tobramycin, PF, ([name]) 300 mg/5 mL nebulizer solution Inhale 5 mL (300 mg total) by nebulization every twelve (12) hours. 28 days on and 28 days off. 280 mL 5   ??? zinc gluconate 50 mg (7 mg elemental zinc) tablet Take 50 mg by mouth.       No current facility-administered medications for this visit.     Physical Exam:   There were no vitals taken for this visit.  Well appearing white male in no acute distress, in good spirits. Easy work of breathing without accessory muscle use. Speaking in full sentences.  Soft crackles appreciated over left lower lung field.  Decreased breath sounds over right mid and lower lung fields, consistent with prior resection.  Regular rate and rhythm with normal S1 and S2.  No murmurs, rubs, or gallops.  Clubbing unchanged from prior.  No lower extremity edema.    Diagnostic Review:   The following data were reviewed during this visit with key findings summarized below:  Pulmonary Function Testing:      Spirometry consistent with severe airway obstruction and suggestive of moderate restriction.  FVC improved as compared to prior. Normal inspiratory loop.   FVC (% predicted) FEV1 (% predicted) FEV1/FVC   11/10/2013 3.19 L (68%) 2.16 L (61%)  68%   02/23/2014 3.50 L (76%)  2.23 L (64%)  64%   07/12/2014  2.94 L (66%)  2.02 L (58%)  69%   01/15/2015  2.82 L (62%)  1.79 L (52%)  63%   02/08/2015  3.04 L (66%)  2.06 L (59%)  68%   04/02/2015  2.86 L (63%)  1.83 L (53%)  64%   12/10/2015  2.77 L (62%)  1.62 L (48%)  58%   01/27/2016  2.42 L (54%)  1.55 L (46%)  64%   03/24/2016  3.36 L (75%)  1.99 L (59%)  59%   07/21/2016  2.56 L (57%)  1.54 L (46%)  60%   12/29/2016  2.80 L (63%)  1.57 L (47%)  56%   04/06/2017  2.61 L (59%)  1.46 L (44%)  56%   07/06/2017  2.56 L (58%)  1.49 L (45%)  58%   10/12/2017  2.63 L (59%)  1.47 L (44%)  56%   01/18/2018  2.68 L (63%)  1.36 L (42%)  51%   05/17/2018 2.49 L (57%)  1.25 L (38%)  50%   09/26/2018 2.43 L (57%)  1.26 L (39%)  52%   01/26/2019 2.16 L (51%)  1.16 L (36%)  54%   06/06/2019 2.47 L (59%) 1.11 L (34%) 45%   11/10/19 2.14 L Review:   The following data were reviewed during this visit with key findings summarized below:  Pulmonary Function Testing:      Spirometry consistent with severe airway obstruction and suggestive of moderate restriction.  FVC improved as compared to prior. Normal inspiratory loop.   FVC (% predicted) FEV1 (% predicted) FEV1/FVC   11/10/2013 3.19 L (68%) 2.16 L (61%)  68%   02/23/2014 3.50 L (76%)  2.23 L (64%)  64%   07/12/2014  2.94 L (66%)  2.02 L (58%)  69%   01/15/2015  2.82 L (62%)  1.79 L (52%)  63%   02/08/2015  3.04 L (66%)  2.06 L (59%)  68%   04/02/2015  2.86  L (63%)  1.83 L (53%)  64%   12/10/2015  2.77 L (62%)  1.62 L (48%)  58%   01/27/2016  2.42 L (54%)  1.55 L (46%)  64%   03/24/2016  3.36 L (75%)  1.99 L (59%)  59%   07/21/2016  2.56 L (57%)  1.54 L (46%)  60%   12/29/2016  2.80 L (63%)  1.57 L (47%)  56%   04/06/2017  2.61 L (59%)  1.46 L (44%)  56%   07/06/2017  2.56 L (58%)  1.49 L (45%)  58%   10/12/2017  2.63 L (59%)  1.47 L (44%)  56%   01/18/2018  2.68 L (63%)  1.36 L (42%)  51%   05/17/2018 2.49 L (57%)  1.25 L (38%)  50%   09/26/2018 2.43 L (57%)  1.26 L (39%)  52%   01/26/2019 2.16 L (51%)  1.16 L (36%)  54%   06/06/2019 2.47 L (59%) 1.11 L (34%) 45%   11/10/19 2.14 L (50%) 1.20 L (37%) 56%   02/13/20 2.01 L (48%) 1.08 L (34%) 54%   05/02/20 2.29 L (54%) 1.11 L (34%) 48%   07/02/20 (pre) 2.11 L (51%) 1.17 L (37%) 56%   07/02/20 (post) 2.26 L (54%) [+7.2%] 1.22 L (38%) [+4.0%] 54%   10/31/20 2.46 L (59%) 1.17 L (37%) 48%   03/18/21        6-minute Walk (12/06/19): Heart Rate: Resting Heart Rate was 71 bpm. Maximum Heart Rate achieved 97 bpm. Patient did reach target heart rate (85-132 bpm).  Heart rate returned to 77 bpm after 2 minutes of recovery.  Oxygen Saturation: Resting O2 Saturation was 94% on RA. O2 saturation nadir was 87% at minute 2. O2 saturation improved to 96% with 2L via [place] at minute 3 and maintained saturations above 88% for remainder of walk. O2 saturation returned to 96% on room air after 2 6 minute walk distance of 558 m (LLN of 419m). Percent of predicted 6 minute walk distance of 68%. Heart rate returned to 96 bpm after 2 minutes of recovery. O2 saturation returned to 96% on RA after 2 minutes of recovery. Maximum BORG score was 7 at minute 6. Concluding BORG score was 2 after 2 minutes of recovery. No evidence of resting or exercise induced hypoxemia. No supplemental oxygen is required at this time.    6-Minute Walk (01/26/19): Resting Heart Rate was 86. Maximum Heart Rate achieved 97. Patient did reach target heart rate (86-133). Resting O2 Saturation was 99% on RA. Resting BORG score was 1. O2 saturation nadir was 95% at minutes 3 and 6. Total 6 minute walk distance was 350 meters. Predicted 6 minute walk distance of 558 m (LLN of 413m). Percent of predicted 6 minute walk distance of 63%. Heart rate returned to 86 bpm after 2 minutes of recovery. O2 saturation returned to 96% on RA after 2 /minutes of recovery. Maximum BORG score was 6 at minutes 5 and 6. Concluding BORG score was 1 after 2 minutes of recovery. No evidence of resting or exercise induced hypoxemia. No supplemental oxygen is required at this time.    Cultures:  ?? Source Bacterial Culture AFB Smear AFB Culture   04/02/15 Sputum 2+ OPF; 1+ Probably mPsA Negative Negative   07/02/15 Sputum 4+ OPF; 3+ mPsA; 3+ sPsA; 1+ Steno Negative Negative   01/23/16 Sputum 2+ OPF; 1+ mPsA; 1+ sPsA - -   03/24/16 Sputum 3+ OPF; 3+ mPsA; 3+ sPsA Negative  Overgrown   07/21/16 Sputum 4+ OPF; 4+ mPsA; 2+ sPsA Negative Negative   12/29/16 Sputum 4+ OPF; 3+ mPsA; 3+ sPsA Negative Actinomadura sp   04/06/17 Sputum 3+ OPF; 4+ mPsA Negative Negative   07/06/17 Sputum 4+ OPF; 3+ mPsA Negative Negative   10/12/17 Sputum 4+ OPF; 3+ mPsA Negative Negative   01/18/18 Sputum OPF Negative Negative   05/17/18 Sputum 4+ OPF; 3+ mPsA - -   09/26/18 Sputum 4+ OPF; 3+ mPsA Negative Negative   01/26/19 Sputum 4+ OPF; 2+ mPsA Negative Negative   02/15/19 Sputum 3+ OPF; 3+ mPsA Negative Negative   06/06/19 Sputum 3+ OPF; 3+ sPsA; 3+ mPsA Negative Negative   11/10/19 Sputum 2+ OPF; 3+ mPsA Negative Negative   02/13/20 Sputum OPF Negative Negative   07/02/20 Sputum 3+ OPF; 1+sPsA; 3+ mPsA negative negative   10/31/20 Sputum 4+ OPF; 2+ sPsA; 4+ mPsA negative negative   11/17/20 Sputum 3+ OPF; 3+ mPsA negative negative   02/21/21 Sputum Few PsA negative In process   ??  Bronchiectasis Evaluation   IgG with subclasses:   Lab Results   Component Value Date/Time    IGGT 996 04/28/2012 06:21 AM    IGG1 402 04/28/2012 06:21 AM    IGG2 399 04/28/2012 06:21 AM    IGG3 148.0 (H) 04/28/2012 06:21 AM    IGG4 47.7 04/28/2012 06:21 AM     IgA (09/21/11): 283   IgM (09/21/11): 52   IgE:   Lab Results   Component Value Date/Time    IGE 16.6 02/13/2020 10:24 AM     Specific Titers: No results found for: TETANUSAB, TETGV, DIPHTERIAAB, DIPGV, S. pneumonia titers  HIV (05/02/20): Non-reactive  Autoimmune (05/02/20): ANA negative   ANCA: No results found for: ANCA, IFA, PR3ELISA, PR3QT, MPO, MPOQT  CBC-D (05/02/20): 6.1>14.8/44.3<156; ANC 4.1; ALC 1.2; AEC 0.17  Lab Results   Component Value Date/Time    WBC 4.1 11/20/2020 04:03 AM    WBC 6.1 11/19/2020 03:39 AM    WBC 7.7 11/18/2020 03:29 AM    WBC 7.9 08/13/2013 03:57 AM    WBC 9.4 08/12/2013 04:21 AM    WBC 11.7 (H) 08/11/2013 04:04 AM    HGB 11.8 (L) 11/20/2020 04:03 AM    HGB 14.8 03/24/2015 05:31 PM    HCT 34.5 (L) 11/20/2020 04:03 AM    HCT 30.8 (L) 08/13/2013 03:57 AM    PLT 134 (L) 11/20/2020 04:03 AM    PLT 292 08/13/2013 03:57 AM    NEUTROABS 7.8 11/17/2020 02:31 PM    NEUTROABS 3.7 02/13/2020 10:24 AM    NEUTROABS 3.2 04/19/2017 05:45 AM    NEUTROABS 4.9 06/21/2013 09:31 PM    NEUTROABS 4.6 06/21/2013 08:13 PM    NEUTROABS 4.1 04/26/2012 11:54 PM    LYMPHSABS 1.4 11/17/2020 02:31 PM    LYMPHSABS 1.4 02/13/2020 10:24 AM    LYMPHSABS 1.4 (L) 04/19/2017 05:45 AM    LYMPHSABS 1.9 06/21/2013 09:31 PM    LYMPHSABS 1.8 06/21/2013 08:13 PM    LYMPHSABS 2.8 04/26/2012 11:54 PM    EOSABS 0.2 11/17/2020 02:31 PM    EOSABS 0.2 02/13/2020 10:24 AM    EOSABS 0.2 04/19/2017 05:45 AM    EOSABS 0.2 06/21/2013 09:31 PM    EOSABS 0.2 06/21/2013 08:13 PM    EOSABS 0.3 04/26/2012 11:54 PM    BASOSABS 0.0 11/17/2020 02:31 PM    BASOSABS 0.0 06/21/2013 09:31 PM     Alpha-1 level: No results found for: A1TRYP  Alpha-1 genotype (  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      IgE (05/23/18):               Imaging  Chest CT (02/25/21): Images personally reviewed.  Stable sequelae of right middle lobectomy and right lower lobe wedge resection. Stable consolidation in the medial posterior right upper lobe but increased consolidation of the right lung apex. Stable bilateral diffuse extensive bronchiectasis, bronchial wall thickening, and sequelae of chronic endobronchial infection.    CXR (05/02/20): Images not available for review. Per report, Visualized cardiac and mediastinal contours within normal limits allowing for mediastinal shift to the right. Circumferential right pleural fluid/thickening. Volume loss in the right lung consistent with known right lung resection. Irregular opacities throughout both lungs, right greater than left, consistent with known bronchiectasis.     Chest CT (11/10/19): Images personally reviewed. Stable sequelae of right middle lobectomy and right lower lobe wedge resection. Stable consolidation in the medial posterior right upper lobe and right lung apex, bilateral diffuse extensive bronchiectasis and bronchial wall thickening and unchanged sequelae of chronic endobronchial infection.    Chest CT (02/15/19): Images personally reviewed. Stable sequelae of right middle lobectomy and right lower lobe wedge resection.  Unchanged diffuse extensive bronchiectasis and bronchial wall thickening bilaterally.  Diffusely scattered tree-in-bud nodules in bilateral lung parenchyma with interval increase in tree-in-bud nodules in left lung; the constellation of these findings are consistent with sequelae of worsening chronic endobronchial infection.    Chest CT (03/31/16): Images personally reviewed. Status post right middle and right lower lobectomies. Tiny loculated basilar right hydropneumothorax, probably chronic. Moderate cylindrical and varicoid bronchiectasis throughout both lungs, with associated scattered mucoid impaction, tree-in-bud opacities and scattered peribronchovascular and subpleural consolidation in the right upper lobe. 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 Two-vessel coronary atherosclerosis. Nonobstructing right nephrolithiasis.    Modified Barium Swallow (01/17/19): Shallow laryngeal penetration observed with thin liquids and puree.  No aspiration identified with any of the tested consistencies.    Immunization History   Administered Date(s) Administered   ??? COVID-19 VACC,MRNA,(PFIZER)(PF) 04/20/2019, 05/18/2019, 12/01/2019, 05/03/2020   ??? Hepatitis A 05/13/2020   ??? Hepatitis B, Adult 05/13/2020, 06/11/2020   ??? INFLUENZA TIV (TRI) 55MO+ W/ PRESERV (IM) 11/13/2010, 01/07/2012, 12/09/2017, 11/30/2019   ??? INFLUENZA TIV (TRI) PF (IM) 02/20/2012, 12/09/2012   ??? Influenza Vaccine Quad (IIV4 PF) 70mo+ injectable 01/31/2016, 12/09/2017   ??? Influenza Virus Vaccine, unspecified formulation 12/25/2014, 12/01/2016   ??? Influenza Whole 12/21/2008   ??? PNEUMOCOCCAL POLYSACCHARIDE 23 10/22/2010, 02/07/2016, 10/07/2019   ??? Pneumococcal Conjugate 13-Valent 04/28/2013   ??? TdaP 03/22/2012   ??? ZOSTAVAX - ZOSTER VACCINE, LIVE, SQ 07/03/2014, 06/11/2017, 01/19/2018     Portions of this record have been created using [employer]. Dictation errors have been sought, but may not have been identified and corrected.    Viona Gilmore, MD  03/17/21  3:23 PM

## 2021-03-18 ENCOUNTER — Ambulatory Visit
Admit: 2021-03-18 | Discharge: 2021-03-18 | Payer: PRIVATE HEALTH INSURANCE | Attending: Internal Medicine | Primary: Internal Medicine

## 2021-03-18 ENCOUNTER — Ambulatory Visit: Admit: 2021-03-18 | Discharge: 2021-03-19 | Payer: PRIVATE HEALTH INSURANCE

## 2021-03-18 ENCOUNTER — Ambulatory Visit
Admit: 2021-03-18 | Discharge: 2021-03-19 | Payer: PRIVATE HEALTH INSURANCE | Attending: Registered" | Primary: Registered"

## 2021-03-18 ENCOUNTER — Ambulatory Visit
Admit: 2021-03-18 | Discharge: 2021-03-19 | Payer: PRIVATE HEALTH INSURANCE | Attending: Hospice and Palliative Medicine | Primary: Hospice and Palliative Medicine

## 2021-03-18 DIAGNOSIS — Z7983 Long term (current) use of bisphosphonates: Secondary | ICD-10-CM | POA: Diagnosis not present

## 2021-03-18 DIAGNOSIS — J479 Bronchiectasis, uncomplicated: Secondary | ICD-10-CM | POA: Diagnosis not present

## 2021-03-18 DIAGNOSIS — R042 Hemoptysis: Secondary | ICD-10-CM | POA: Diagnosis not present

## 2021-03-18 DIAGNOSIS — J9611 Chronic respiratory failure with hypoxia: Secondary | ICD-10-CM | POA: Diagnosis not present

## 2021-03-18 DIAGNOSIS — Z792 Long term (current) use of antibiotics: Secondary | ICD-10-CM | POA: Diagnosis not present

## 2021-03-18 DIAGNOSIS — Z9981 Dependence on supplemental oxygen: Secondary | ICD-10-CM | POA: Diagnosis not present

## 2021-03-18 DIAGNOSIS — Z833 Family history of diabetes mellitus: Secondary | ICD-10-CM | POA: Diagnosis not present

## 2021-03-18 DIAGNOSIS — M810 Age-related osteoporosis without current pathological fracture: Secondary | ICD-10-CM | POA: Diagnosis not present

## 2021-03-18 DIAGNOSIS — I10 Essential (primary) hypertension: Secondary | ICD-10-CM | POA: Diagnosis not present

## 2021-03-18 NOTE — Unmapped (Signed)
[facility]   Medical Nutrition Therapy Consultation       Visit Type:    Initial Assessment    Referral Reason: :  10 pounds weight loss over the past 2 months, poor appetite     Jordan [name] is a 65 y.o. male seen for medical nutrition therapy for unintentional weight loss. His active problem list, medication list, allergies, family history, and notes from last encounter were reviewed.     His interim medical history is significant for recent admission to hospital for worsening dyspnea.    Anthropometrics   Height: 175.3 cm (5' 9.02)   Weight: 80.1 kg (176 lb 9.4 oz)   Body mass index is 26.07 kg/m??.    Wt Readings from Last 5 Encounters:   03/18/21 80.1 kg (176 lb 9.4 oz)   03/18/21 83 kg (183 lb)   11/18/20 80.1 kg (176 lb 9.4 oz)   10/31/20 83 kg (183 lb)   07/02/20 83.9 kg (185 lb)        Usual body weight:. Patient reports that his weight has trended up since being admitted to the hospital recently, likely due to Ensure supplements. He says that he would like to gain some weight in order to have more energy and to have some wiggle room in case he loses weight while sick again. Question accuracy of documented weights. Patient reports that he weighed 168 pounds at his lowest, but thinks that he has gained a couple pounds back. He says that he would like to wait 175 pounds ??2 pounds.    Ideal Body Weight: 72.68 kg  Weight in (lb) to have BMI = 25: 169    Nutrition Risk Screening:   Food Insecurity: No Food Insecurity   ??? Worried About Programme researcher, broadcasting/film/video in the Last Year: Never true   ??? Ran Out of Food in the Last Year: Never true        Nutrition Focused Physical Exam:  Fat Areas Examined  Orbital: Mild loss  Upper Arm: Mild loss      Muscle Areas Examined  Temple: Mild loss  Clavicle: Mild loss  Acromion: Mild loss  Dorsal Hand: Mild loss  Patellar: Mild loss  Anterior Thigh: Mild loss  Posterior Calf: Mild loss       Malnutrition Screening:   Non-severe (Moderate) Protein-Calorie Malnutrition in the context of chronic illness (03/18/21 1141)    Biochemical Data, Medical Tests and Procedures:  All pertinent labs and imaging reviewed by Craig Staggers at 10:55 AM 03/18/2021. No pertinent labs at this time.       Medications and Vitamin/Mineral Supplementation:   All nutritionally pertinent medications reviewed on 03/18/2021.   No nutritionally-pertinent medications identified at this time.   He is taking nutrition supplements including     Current Outpatient Medications   Medication Sig Dispense Refill   ??? albuterol 2.5 mg /3 mL (0.083 %) nebulizer solution INHALE 3 ML (2.5 MG TOTAL) BY NEBULIZATION TWO (2) TIMES A DAY. 525 mL 3   ??? albuterol HFA 90 mcg/actuation inhaler Inhale 2 puffs Four (4) times a day. (Patient taking differently: Inhale 2 puffs four (4) times a day as needed.) 25.5 g 3   ??? alcohol swabs (ALCOHOL PREP PADS) PadM Use as directed with inhaled antibiotics 100 each 5   ??? alendronate (FOSAMAX) 70 MG tablet TAKE 1 TABLET BY MOUTH EVERY 7 DAYS FOR 12 DOSES     ??? ascorbic acid, vitamin C, (VITAMIN  C) 1000 MG tablet Take 1,000 mg by mouth daily.     ??? atorvastatin (LIPITOR) 10 MG tablet Take 10 mg by mouth daily.     ??? azithromycin (ZITHROMAX) 250 MG tablet TAKE 1 TABLET BY MOUTH EVERY DAY 90 tablet 3   ??? calcium carbonate (CALCIUM 600 ORAL) Take 2 tablets by mouth two (2) times a day. 1 in morning and 1 in the evening     ??? cholecalciferol, vitamin D3-50 mcg, 2,000 unit,, 50 mcg (2,000 unit) cap Take 2,000 Units by mouth daily.     ??? coenzyme Q10 300 mg cap Take 10-300 mg by mouth daily.     ??? colistimethate (COLYMYCIN) 150 mg injection Inject 2 mL sterile water for injection to mix colistin vial, then draw up 2 mL (150mg ) and inhale 2 times a day, 28 days on and 28 days off. 60 each 5   ??? dornase alfa (PULMOZYME) 1 mg/mL nebulizer solution Inhale 1 ampule (2.5 mg) daily. Use at least 30-60 minutes before airway clearance, or after airway clearance 225 mL 3   ??? empty container Misc USE AS DIRECTED 1 each 2   ??? ferrous sulfate 325 (65 FE) MG tablet Take 325 mg by mouth Two (2) times a day. BID     ??? HYDROcodone-acetaminophen (NORCO) 7.5-325 mg per tablet Take 1 tablet by mouth three (3) times a day (at 6am, noon and 6pm).     ??? montelukast (SINGULAIR) 10 mg tablet TAKE 1 TABLET BY MOUTH EVERY DAY AT NIGHT 90 tablet 3   ??? multivitamin-minerals-lutein Tab Take 1 tablet by mouth daily.      ??? nebulizers (LC PLUS) Misc Use as directed with tobramycin 1 each 5   ??? nebulizers (LC PLUS) Misc use with Colistin 1 each 5   ??? nebulizers (LC PLUS) Misc Use as directed with hypertonic saline 10% 3 each 3   ??? needle, disp, 21 G (BD REGULAR BEVEL NEEDLES) 21 gauge x 1 1/2 Ndle Use as directed with inhaled Colistin 100 each 3   ??? omeprazole (PRILOSEC) 20 MG capsule Take 20 mg by mouth Two (2) times a day.      ??? sodium chloride (BD POSIFLUSH NORMAL SALINE 0.9) 0.9 % injection Inject 2mL of 0.9%NaCl into colistin vial & gently mix. After withdrawing colistin dose, add an additional 1mL of 0.9%NaCl to neb cup with the colistin dose. 180 mL 11   ??? sodium chloride 10 % Nebu Inhale 5 mL by nebulization Two (2) times a day. 900 mL 3   ??? sterile water Soln Use 2mL to mix Colistin, then add additional 2mL to neb cup with 2mL of mixed colistin. Inhale twice daily 28 days on and 28 days off. 600 mL 5   ??? SYMBICORT 160-4.5 mcg/actuation inhaler INHALE 2 PUFFS INTO THE LUNGS 2 TIMES A DAY. 30.6 g 1   ??? syringe with needle (BD LUER-LOK SYRINGE) 3 mL 20 gauge x 1 1/2 Syrg For use with inhaled antibiotic (Colistin) 60 each 30   ??? syringe with needle (BD LUER-LOK SYRINGE) 3 mL 20 gauge x 1 Syrg For use with inhaled antibiotic (Colistin) 60 each 5   ??? tiotropium bromide (SPIRIVA RESPIMAT) 2.5 mcg/actuation inhalation mist Inhale 2 puffs daily. 12 g 3   ??? tobramycin, PF, ([name]) 300 mg/5 mL nebulizer solution Inhale 5 mL (300 mg total) by nebulization every twelve (12) hours. 28 days on and 28 days off. 280 mL 5   ??? zinc gluconate 50 mg (  7 mg elemental zinc) tablet Take 50 mg by mouth.       No current facility-administered medications for this visit.       Nutrition History:   Dietary Restrictions: Patient reports difficulty tolerating chocolate and certain types of dairy, although he said that he has no issues with yogurt or Ensure.     Hunger and Satiety: Poor appetite and limited intake.     Food Safety and Access: No to little issues noted.     Patient reports that he has a greatly decreased appetite since being in the hospital with pulmonary issues earlier this year. He said that he was started on 3 Ensure supplements per day at the hospital and tolerated that well, and has continued that since being at home. Patient reports that before the hospital he might of been able to eat 2 sausage biscuits or a full steak, but now he is only able to eat half of that it once. He says that he tolerates to eat significant amounts of food even if he enjoys them. Patient reports that he still sometimes gets hungry if he goes a long time without eating. He said that he had an Ensure this morning but has not had anything since then, and he has now developed an appetite. Patient says that he relies on alarms to help remind him of things that he needs to do during the day. Patient seemed very motivated to gain weight. When given a handout with potential strategies for weight gain. He showed interest in adding extra calories to foods, but ultimately decided that eating high calorie snacks, scheduling alarms to remind him to eat every 3 hours, and swapping out the water that he usually drinks 4 high-calorie beverages would be ideal for gaining weight. Patient reports that he usually drinks water or coffee but really enjoys orange juice; his wife added that she has been buying him orange juice recently to get him extra calories. Patient says that he has a bunch of peanut butter crackers at home and uses the Ensure supplements as a snack as well. He is also interested in getting breakfast bars at the grocery store. Dietitian recommended looking for breakfast bars that patient enjoys and which also contain adequate protein. Patient reports that he very rarely eats out, although he sometimes gets takeout food.    Eating Behaviors:  No concerns noted.     Physical Activity:  Physical activity level is sedentary with little to no exercise.     Daily Estimated Nutritional Needs:  Energy: 2000-2400 kcals [25-30 kcal/kg using last recorded weight, 80.1 kg (03/18/21 1137)]  Protein: 80-95 gm [1.0-1.2 gm/kg using last recorded weight, 80.1 kg (03/18/21 1137)]  Carbohydrate:   [no restriction]  Fluid:   [per MD team ]      Nutrition Goals & Evaluation      Superordinate goal(s):  Gain weight to reach goal weight of 175 lb  (New)    Intermediate goal(s):  Have more energy  (New)  Have wiggle room in case he loses weight during future illness  (New)    Subordinate goal(s)/actions:  Set phone alarm to eat small snack or meal every 3 hours  (New)  Swap out water/coffee for more high-calorie beverages like orange juice  (New)  Add high-calorie snacks like Ensure, peanut butter crackers (New)    Nutrition goals reviewed, and relevant barriers identified and addressed: none evident. He is evaluated to have excellent willingness and ability to achieve nutrition goals.  Nutrition Assessment       Patient would benefit from gradual weight gain to maintain BMI 23-29 due to age >65 years. Patient would benefit from being on higher end of this weight range to maximize energy and to maximize chances of maintaining normal BMI if he loses weight during future illness.     Nutrition Intervention        Nutrition Plan:   Set phone alarm to eat small snack or meal every 3 hours   Swap out water/coffee for more high-calorie beverages like orange juice    Add high-calorie snacks like Ensure, peanut butter crackers     Follow up will occur in 5 months, at next at next bronchiectasis appointment per pt request.       Food/Nutrition-related history, Anthropometric measurements, Biochemical data, medical tests, procedures and Nutrition-focused physical findings will be assessed at time of follow-up.       Recommendations for Clinical Team:  Continue excellent clinical care.       Time spent 20 minutes        Karie Chimera, MS, RD, LDN, CNSC  Pager # [phone]

## 2021-03-18 NOTE — Unmapped (Signed)
Patient says that his nebulizer isn't working well and it takes over an hour to complete his treatments.  He has replaced it about once a year.   He would like to order a Pari compressor but would like to wait until the new year as he will have new insurance starting then.

## 2021-03-18 NOTE — Unmapped (Addendum)
-   Set phone alarm to eat small snack or meal every 3 hours   - Swap out water/coffee for more high-calorie beverages like orange juice    - Add high-calorie snacks like Ensure, peanut butter crackers

## 2021-03-18 NOTE — Unmapped (Signed)
Patient was unable to perform Flow Volume Loop due to hemoptysis.

## 2021-03-19 NOTE — Unmapped (Signed)
[facility]    Assessment:      Patient:Jordan [name] (01-Aug-1955)    Jordan [name] is a 65 y.o. male who is seen for follow up of bronchiectasis with chronic Pseudomonas colonization and chronic hypoxemic respiratory failure requiring supplemental oxygen with exertion and sleep. Recent admission to [facility] for worsening dyspnea despite oral antibiotics.  Then developed hemoptysis.  CT not consistent with cavitary disease but I am concerned about an aspergilloma in the LUL.     Plan:      Problem List Items Addressed This Visit        Respiratory    Bronchiectasis (CMS-HCC) - Primary    Relevant Orders    Lower Respiratory Culture    AFB culture    AFB SMEAR (Completed)    CBC w/ Differential    Aspergillus Galactomannan Antigen, Serum    Chronic respiratory failure with hypoxia (CMS-HCC)       Other    Hemoptysis    Relevant Orders    CBC w/ Differential    Aspergillus Galactomannan Antigen, Serum   Therapies:  ??? Patient met with the respiratory therapist today to discuss nebulizer equipment.  ??? Would like to perform bronchoscopy with BAL for fungal and AFB cultures, galactomannan.  I will discuss timing of bronchoscopy with IP to ensure they are available should he have significant bleeding.  ??? Will maintain PICC line until after bronchoscopy so that we have IV access and can use it if need more antibiotics.  ??? Discussed what to do with inhaled therapies if having hemoptysis.   o Should hold all airway clearance.  o Once active bleeding stops for 24 hours, should start nebulizing albuterol mixed with 10% saline.  o If tolerates albuterol/HTS mixture for 24 hours, should then add Vest.  o If tolerates Vest for 24 hours, can increase HTS concentration back up to 10%  o If still no active bleeding, can resume Pulmozyme      Testing:  ??? Patient spontaneously expectorated sputum that was sent for bacterial and AFB cultures. These results will help to guide antibiotic therapy in the future during exacerbations.  ??? If he has future exacerbations, sputum should be sent for bacterial and AFB cultures. The bacterial culture should be processed like a cystic fibrosis sample given the overlapping pathogens. There are standing orders for these cultures in our system.  ??? Will get HH to draw repeat CBC-D and Aspergillus galactomannan.    Referrals:  ??? Bronchiectasis/NTM clinic Dietician met with him today.     Vaccinations:  ??? COVID-19: UTD  ??? Flu: UTD  ??? Pneumococcal: UTD  ??? Tdap: UTD    Jordan [name] will return to clinic in 3 months for follow-up with pre-bronchodilator spirometry and sputum cultures.  He will call or send me a message via MyChart if questions or concerns arise before this visit. Jordan [name] is in agreement with the above plan.    Subjective:      HPI: Jordan [name] is a 65 y.o. male who is seen for follow up of bronchiectasis with PsA colonization and prior history of multiple lung resections.    09/27/18:  I last saw him on 05/17/18. He reached out at the end of May reporting symptoms c/w an exacerbation. I treated him with a two week course of Ciprofloxacin, which seemed to help.  Reports that he feels winded too much - if walks up incline or up a couple hundred feet. This has worsened over the  past year. Has to stop and let lungs catch up.  Of note, he has been off testosterone supplementation for the past year after he was diagnosed with prostate cancer.    He received the [facility], which he is using with nebulized HTS 7% and Pulmozyme. After using the Vest for 6-8 weeks, he noticed improvement with more phlegm mobilization. Sometimes has to pause it to cough to get stuff up.  Plugging feeling happening a lot more, especially with the vest.  Most of the time, sputum is thinned out with Pulmozyme but still gets those plugs.    Had to stop eating ice cream or drinking milkshake - will cough will eating and that makes him vomit on occasion. Past few weeks, when eating will cough, no matter what he eats. Happens almost once a day. Doesn't feel like things get stuck typically.      02/07/19:  Following his last visit, Jordan [name] reached out regarding portable oxygen.  We had considered the life 2000 but he does not qualify because he does not use supplemental oxygen.  He performed the 6-minute walk test prior to today's visit but did not desaturate.  He wonders if this is because the test was done in the morning and he doesn't have breathing issues in the morning.  Typically, breathing issues occur in afternoon.  During these episodes, he will have to use his inhaler and feels like he is suffocating.  He describes it as an uncomfortable feeling that limits his ability to do things with family.  If he is having trouble catching breath, oximetry is 92-93%. Has seen it in the 80s if comes in from outside and is short of breath.    For airway clearance, he is nebulizing 10% hypertonic saline and using his percussive vest for 20 minute sessions in AM and PM. Has to pause it to cough and get stuff up.  Coughing up more stuff since last visit and is waking up 1-2 times a night with cough.having coughing fits between clearance session.  What he expectorates is less of a mucous plug and mostly stringy sputum that is yellowish to light brown in color.  No hemoptysis.  On Saturday, he had a coughing fit that nearly triggered posttussive emesis.  He only expectorated about 3-4 globs of phlegm.  Nebulizing Pulmozyme daily.  Alternates [name] and colistin every other month.  On [name] this month and will start Colistin on Dec 1.    06/06/19:  Coughing more and more sputum.  Waking up 1-2 times per night coughing. Can cough once or just keep coughing.  Sputum is yellow in color without evidence of blood.  He is not expectorating green sputum currently. On colistin this month; alternates with [name]. Went through January off all inhaled antibiotics.  Oxygen levels in the low 90s.  Uses rescue inhaler if goes up stairs.  Regularly performing airway clearance with nebulized 10% hypertonic saline twice a day, percussive vest twice a day, and Pulmozyme daily. He last received IV antibiotics in January 2019 but has been treated with oral antibiotics on 04/19/18 and 08/17/18, 03/10/19.      11/10/19:  1-2 times per week can get sharp right sided chest pain - even with nipple line. Can happen with clearance or activity or rest. Never during sleep. Following his hospitalization, he first experienced it in June. Lasts only about 10 seconds. Not reproducible. Limited activity due to dyspnea. Regularly performing airway clearance with nebulized 10% hypertonic saline twice a day, percussive  vest twice a day, and Pulmozyme daily. Remains on chronic azithromycin therapy.    02/13/20:  Showed up to [facility] with oxygen saturations of 87-88% after walking from parking lot (<100 yd) and up incline without supplemental oxygen.  Using 2L continuous flow with exertion and sleep.  Had sleep study done at [facility] on RA and was told that he should be fine with just the supplemental oxygen. I do not have this report. Using oxygen when goes to football game; needs long period to recover from activity.  Going up 20 foot incline with oxygen, will have to stop and catch breath.     Wheezing more lately and endorses chest tightness. Has need to use albuterol as rescue 3-4 times per day.  Used albuterol in the car on the way here. Typically would be able to cough up large amount of mucus after PFTs but only coughed up small amount. Feels like mucus is stuck in lungs despite twice daily airway clearance with 10% HTS and Vest. Also using Pulmozyme once a day and Symbicort twice a day after clearance.  Using colistin this month. He ordered the [name] nebs to use next month even though insurance won't cover it. Feels like inhaled antibiotics definitely help.    05/10/20:   Saw [facility] for lung transplant evaluation.  Felt to be poor candidate due to prior surgeries and primary disease.  Candidacy closed at [facility]. Referral sent to [facility] and [facility] on 2/15.  Based on labs and testing done there, getting Hep B vaccination series (not Hep A or Hep B immune).  Also getting DEXA that was ordered by Dr. Abigail Miyamoto.    Had PSG done at [facility] - no evidence of OSA but does have increased upper airway resistance.  He never sleeps on his back; only sleeps on side or stomach.   Fatigue is most burdensome symptom.    Regrets that he let the surgeon operate on his lung before he met me.  He completed pulmonary rehab at [facility] but they only had him on machine for ~30 minutes and then stretching.  No strength exercises. He is planning on joining a gym based on recommendations from [facility] transplant.  Was surprised that he didn't desaturate during his walk test at [facility].    07/02/20:  At last visit, referred him to [facility] for pulmonary rehab. Found to have osteoporosis in hips. Turned down for transplant at [facility] based on chart review.  No change in breathing or cough.  Every once in a while, has bad coughing spells.  Same sputum volume and color. Globby to stringy. Not dark brown or bloody.  Sputum is light brown to yellowish.  Breathing about the same.  Started on 1200 mg calcium for osteoporosis.  Starting telehealth pulmonary rehab - feels optimistic that it will help him do more.    10/31/20:  Heart monitor for pulm rehab - had 40 events of fast rate or afib.  Has felt heart fluttering.  Did event monitor for 8 days.  Getting echocardiogram on Wednesday and then seeing cardiologist the next week.     Was having sharp pains over right abdomen below ribs.  Once a month, now once every 2 weeks.    03/18/21:  Since his last clinic visit, he was evaluated by cardiology at [facility] (October) and then was admitted to [facility] earlier this month due to hemoptysis that occurred while on ciprofloxacin for an exacerbation.  He also  had worsening shortness of breath.  Was discharged home on 21 days of meropenem.  Did not undergo embolization or bronchoscopy.     Hasn't felt good since August.  No energy, lost weight. End Nov, asked for Cipro and for few days felt better. Then didn't feel better and was ordeal to see PCP.  WBC elevated in ER - they called me. Friday night/Saturday started seeing dark blood in sputum.  Monday night 8PM, got breathing treatment with bright red globs.  Large volume blood and phlegm. They were going to do bronchoscopy but then bleeding improved with change in abx, holding 10% saline. Was continued on percussive vest and Pulmozyme. Friday to Saturday, wine colored.  Then pinkish. Sunday night could barely see blood and then gone Monday.    Finished Meropenem last night (3 weeks total). Christmas eve morning, started seeing bright red streaks. Yesterday, coughed up bright red blood that was 50/50 with phlegm.  Today more wine colored.   Shortness of breath better with meropenem but no change in hemoptysis.    Respiratory Symptoms:   ??? Cough: productive of sputum.   ??? Nocturnal awakenings: absent.  Perhaps once a month.  ??? Wheezing: absent.  ??? Chest tightness: absent.  ??? Rescue albuterol use: Not in months.  ??? Pleurisy: absent.  ??? Hemoptysis: as above.  ??? MMRC: 1 = I get short of breath when hurrying on level ground or walking up a slight hill.    Exacerbations:   ??? Number of exacerbations treated in the past year: 2  ??? Dates of exacerbations: 03/2018, 07/2018, 02/2019, 09/2019, 12/2019, 10/2020, 01/2021  ??? Number of hospitalizations for exacerbations in the past 2 years: 3  ??? Dates of hospitalizations: 01/2015, 03/2015, 01/2016, 03/2017, 09/2019, 10/2020, 02/2021    Pulmonary Therapies:   ??? Airway clearance: Pulmozyme daily, HTS 10% bid, percussive vest bid. No HTS 10% since Saturday.   ??? Inhaled antibiotics: Alternates [name] and Colistin.- on [name] currently.  ??? Inhalers: Symbicort 160/4.5, Spiriva Respimat  ??? Chronic antibiotics: azithromycin 250 mg daily  ??? Exercise: Was walking 3 miles at 2.1 without stopping on the treadmill.  ??? Pulmonary Rehab: Telehealth pulmonary rehab July 2022  ??? Supplemental oxygen: 2L continuous and with sleep.  ??? NIPPV: No    ROS: Lost appetite, 10 pounds in past 2 months. Lost 5 pounds before hospital but has gained it back with Ensure tid. 1-2 nights in hopsital had night sweats.        Past Medical History:   Diagnosis Date   ??? Abscess of lung (CMS-HCC) 11/06/2010    CT Chest 10/22/10 12/05/2010 right thoracotomy resection of right middle lobe and resection of right lower lobe abscess    ??? Arthritis     back   ??? Biceps tendon tear 2013    left side   ??? Bronchiectasis (CMS-HCC)     chronic psuedomonas infection   ??? Degenerative joint disease of left acromioclavicular joint    ??? GERD (gastroesophageal reflux disease)    ??? Hypertension    ??? Obstructive sleep apnea on CPAP 03/14/13    AHI 33.5, on BiPAP 12/8 based on sleep study 09/2013   ??? Pneumonia 2012    Lung abcess    ??? PONV (postoperative nausea and vomiting)    ??? Prostate cancer (CMS-HCC) 09/09/2017   ??? Rotator cuff injury left   ??? Vertigo        Past Surgical History:   Procedure Laterality Date   ??? APPENDECTOMY  1969   ??? BACK SURGERY     ??? BRONCHOSCOPY  10/19/2010    [facility]   ??? CARPAL TUNNEL RELEASE  1999   ??? LUNG REMOVAL, PARTIAL Right 10/2010    RML and RLL partially resected   ??? MENISCECTOMY  2011   ??? NECK SURGERY  1993, 1999, 2002   ??? PR GERD TST W/ MUCOS IMPEDE ELECTROD,>1HR N/A 07/06/2012    Procedure: ESOPHAGEAL FUNCTION TEST, GASTROESOPHAGEAL REFLUX TEST W/ NASAL CATHETER INTRALUMINAL IMPEDANCE ELECTRODE(S) PLACEMENT, RECORDING, ANALYSIS AND INTERPRETATION; PROLONGED;  Surgeon: None None;  [facility];  Service: Gastroenterology   ??? PR OPEN TREAT RIB FRACTURE W/INT FIXATION, UNILATERAL, 1-2 RIBS Right 03/19/2015    Procedure: OPEN TREATMENT OF RIB FRACTURE REQUIRING INTERNAL FIXATION, UNILATERAL; 1-2 RIBS;  Surgeon: Evert Kohl, MD;  Location: MAIN OR [facility];  Service: Cardiothoracic   ??? PR THORACOTOMY W/THERAP WEDGE RESEXN ADDL IPSILATRL Right 08/07/2013    Procedure: THORACOTOMY; WITH THERAPEUTIC LOBECTOMY OF RIGHT MIDDLE AND RIGHT LOWER LOBE RESECTION , EACH ADDITIONAL RESECTION, IPSILATERAL;  Surgeon: Alvester Chou, MD;  Location: MAIN OR [facility];  Service: Cardiothoracic   ??? TONSILLECTOMY  1965       Family History   Problem Relation Age of Onset   ??? Alzheimer's disease Mother    ??? Stroke Mother    ??? Bronchiectasis  Brother         presumed   ??? Bronchiectasis  Brother         presumed   ??? Liver disease Father    ??? Diabetes Father    ??? Heart failure Father    ??? Bronchiectasis  Daughter    ??? Asthma Son         childhood asthma   ??? Clotting disorder Neg Hx    ??? Anesthesia problems Neg Hx        Social History     Tobacco Use   ??? Smoking status: Never   ??? Smokeless tobacco: Never   Substance Use Topics   ??? Alcohol use: No     Alcohol/week: 0.0 standard drinks   ??? Drug use: No       Allergies  Reviewed on 03/18/2021      Reactions Comments    Gentamicin Other (See Comments) BALANCE ISSUES    Tobramycin Other (See Comments) ototoxicity    Morphine Nausea And Vomiting         Current Outpatient Medications   Medication Sig Dispense Refill   ??? albuterol 2.5 mg /3 mL (0.083 %) nebulizer solution INHALE 3 ML (2.5 MG TOTAL) BY NEBULIZATION TWO (2) TIMES A DAY. 525 mL 3   ??? alcohol swabs (ALCOHOL PREP PADS) PadM Use as directed with inhaled antibiotics 100 each 5   ??? alendronate (FOSAMAX) 70 MG tablet TAKE 1 TABLET BY MOUTH EVERY 7 DAYS FOR 12 DOSES     ??? ascorbic acid, vitamin C, (VITAMIN C) 1000 MG tablet Take 1,000 mg by mouth daily.     ??? atorvastatin (LIPITOR) 10 MG tablet Take 10 mg by mouth daily.     ??? azithromycin (ZITHROMAX) 250 MG tablet TAKE 1 TABLET BY MOUTH EVERY DAY 90 tablet 3   ??? calcium carbonate (CALCIUM 600 ORAL) Take 2 tablets by mouth two (2) times a day. 1 in morning and 1 in the evening     ??? cholecalciferol, vitamin D3-50 mcg, 2,000 unit,, 50 mcg (2,000 unit) cap Take 2,000 Units by mouth daily.     ???  coenzyme Q10 300 mg cap Take 10-300 mg by mouth daily.     ??? colistimethate (COLYMYCIN) 150 mg injection Inject 2 mL sterile water for injection to mix colistin vial, then draw up 2 mL (150mg ) and inhale 2 times a day, 28 days on and 28 days off. 60 each 5   ??? dornase alfa (PULMOZYME) 1 mg/mL nebulizer solution Inhale 1 ampule (2.5 mg) daily. Use at least 30-60 minutes before airway clearance, or after airway clearance 225 mL 3   ??? empty container Misc USE AS DIRECTED 1 each 2   ??? ferrous sulfate 325 (65 FE) MG tablet Take 325 mg by mouth Two (2) times a day. BID     ??? HYDROcodone-acetaminophen (NORCO) 7.5-325 mg per tablet Take 1 tablet by mouth three (3) times a day (at 6am, noon and 6pm).     ??? montelukast (SINGULAIR) 10 mg tablet TAKE 1 TABLET BY MOUTH EVERY DAY AT NIGHT 90 tablet 3   ??? multivitamin-minerals-lutein Tab Take 1 tablet by mouth daily.      ??? nebulizers (LC PLUS) Misc Use as directed with tobramycin 1 each 5   ??? nebulizers (LC PLUS) Misc use with Colistin 1 each 5   ??? nebulizers (LC PLUS) Misc Use as directed with hypertonic saline 10% 3 each 3   ??? needle, disp, 21 G (BD REGULAR BEVEL NEEDLES) 21 gauge x 1 1/2 Ndle Use as directed with inhaled Colistin 100 each 3   ??? omeprazole (PRILOSEC) 20 MG capsule Take 20 mg by mouth Two (2) times a day.      ??? sodium chloride (BD POSIFLUSH NORMAL SALINE 0.9) 0.9 % injection Inject 2mL of 0.9%NaCl into colistin vial & gently mix. After withdrawing colistin dose, add an additional 1mL of 0.9%NaCl to neb cup with the colistin dose. 180 mL 11   ??? sodium chloride 10 % Nebu Inhale 5 mL by nebulization Two (2) times a day. 900 mL 3   ??? sterile water Soln Use 2mL to mix Colistin, then add additional 2mL to neb cup with 2mL of mixed colistin. Inhale twice daily 28 days on and 28 days off. 600 mL 5   ??? SYMBICORT 160-4.5 mcg/actuation inhaler INHALE 2 PUFFS INTO THE LUNGS 2 TIMES A DAY. 30.6 g 1   ??? syringe with needle (BD LUER-LOK SYRINGE) 3 mL 20 gauge x 1 1/2 Syrg For use with inhaled antibiotic (Colistin) 60 each 30   ??? syringe with needle (BD LUER-LOK SYRINGE) 3 mL 20 gauge x 1 Syrg For use with inhaled antibiotic (Colistin) 60 each 5   ??? tiotropium bromide (SPIRIVA RESPIMAT) 2.5 mcg/actuation inhalation mist Inhale 2 puffs daily. 12 g 3   ??? tobramycin, PF, ([name]) 300 mg/5 mL nebulizer solution Inhale 5 mL (300 mg total) by nebulization every twelve (12) hours. 28 days on and 28 days off. 280 mL 5   ??? zinc gluconate 50 mg (7 mg elemental zinc) tablet Take 50 mg by mouth.     ??? albuterol HFA 90 mcg/actuation inhaler Inhale 2 puffs Four (4) times a day. (Patient taking differently: Inhale 2 puffs four (4) times a day as needed.) 25.5 g 3     No current facility-administered medications for this visit.     Physical Exam:   BP 130/84 (BP Site: R Arm, BP Position: Sitting)  - Temp 36.2 ??C (97.2 ??F) (Temporal)  - Ht 175.3 cm (5' 9)  - SpO2 91% Comment: patient is on 2 liters of room  oxygen - BMI 26.08 kg/m??   Fatigued appearing white male in no acute distress. Easy work of breathing without accessory muscle use. Speaking in full sentences.  Diminished air entry over RUL as compared to LUL posteriorly.  Left lung fields relatively clear to auscultation with faint crackles again appreciated over left lower lung field.   Regular rate and rhythm with normal S1 and S2.  No murmurs, rubs, or gallops.  Clubbing unchanged from prior.  PICC line in RUE - dressing clean. No bleeding, redness, tenderness.    Diagnostic Review:   The following data were reviewed during this visit with key findings summarized below:  Pulmonary Function Testing:      Prior spirometry consistent with severe airway obstruction and suggestive of moderate restriction.  FVC improved as compared to prior. Normal inspiratory loop.  Unable to perform testing today because had hemoptysis.   FVC (% predicted) FEV1 (% predicted) FEV1/FVC 11/10/2013 3.19 L (68%) 2.16 L (61%)  68%   02/23/2014 3.50 L (76%)  2.23 L (64%)  64%   07/12/2014  2.94 L (66%)  2.02 L (58%)  69%   01/15/2015  2.82 L (62%)  1.79 L (52%)  63%   02/08/2015  3.04 L (66%)  2.06 L (59%)  68%   04/02/2015  2.86 L (63%)  1.83 L (53%)  64%   12/10/2015  2.77 L (62%)  1.62 L (48%)  58%   01/27/2016  2.42 L (54%)  1.55 L (46%)  64%   03/24/2016  3.36 L (75%)  1.99 L (59%)  59%   07/21/2016  2.56 L (57%)  1.54 L (46%)  60%   12/29/2016  2.80 L (63%)  1.57 L (47%)  56%   04/06/2017  2.61 L (59%)  1.46 L (44%)  56%   07/06/2017  2.56 L (58%)  1.49 L (45%)  58%   10/12/2017  2.63 L (59%)  1.47 L (44%)  56%   01/18/2018  2.68 L (63%)  1.36 L (42%)  51%   05/17/2018 2.49 L (57%)  1.25 L (38%)  50%   09/26/2018 2.43 L (57%)  1.26 L (39%)  52%   01/26/2019 2.16 L (51%)  1.16 L (36%)  54%   06/06/2019 2.47 L (59%) 1.11 L (34%) 45%   11/10/19 2.14 L (50%) 1.20 L (37%) 56%   02/13/20 2.01 L (48%) 1.08 L (34%) 54%   05/02/20 2.29 L (54%) 1.11 L (34%) 48%   07/02/20 (pre) 2.11 L (51%) 1.17 L (37%) 56%   07/02/20 (post) 2.26 L (54%) [+7.2%] 1.22 L (38%) [+4.0%] 54%   10/31/20 2.46 L (59%) 1.17 L (37%) 48%           6-minute Walk (12/06/19): Heart Rate: Resting Heart Rate was 71 bpm. Maximum Heart Rate achieved 97 bpm. Patient did reach target heart rate (85-132 bpm).  Heart rate returned to 77 bpm after 2 minutes of recovery.  Oxygen Saturation: Resting O2 Saturation was 94% on RA. O2 saturation nadir was 87% at minute 2. O2 saturation improved to 96% with 2L via [place] at minute 3 and maintained saturations above 88% for remainder of walk. O2 saturation returned to 96% on room air after 2 minutes of recovery.  Distance: Total 6 minute walk distance was 320 m. Predicted 6 minute walk distance 548.9 m (LLN of 395.8 m). Percent of predicted 6 minute walk distance of 58%. Testing included one 5-minute pause. Distance obtained after placed on 2L and  walked for an additional 6 minutes.  BORG: Resting BORG score was 1. Maximum BORG score was 5 at minute 2. Concluding BORG score was 0 after 2 minutes of recovery. Impression: Significant exercise induced oxygen desaturation (below 88%). Exertional hypoxemia improved with 2L supplemental oxygen. Walk distance is less than expected suggesting a cardio-respiratory defect -- clinical correlation suggested.  Recommendation: Supplemental oxygen required with exercise at 2L [place]. Consider further evaluation with spirometry, lung volumes, respiratory muscle flow/force measurements, cardiopulmonary stress testing, and/or cardio/vascular studies.    6-Minute Walk (02/15/19): Resting Heart Rate was 91. Maximum Heart Rate achieved 119. Patient did reach target heart rate (86-133). Resting O2 Saturation was 98% on RA. Resting BORG score was 1. O2 saturation nadir was 91% at minute 6. Total 6 minute walk distance was 381 meters. Predicted 6 minute walk distance of 558 m (LLN of 477m). Percent of predicted 6 minute walk distance of 68%. Heart rate returned to 96 bpm after 2 minutes of recovery. O2 saturation returned to 96% on RA after 2 minutes of recovery. Maximum BORG score was 7 at minute 6. Concluding BORG score was 2 after 2 minutes of recovery. No evidence of resting or exercise induced hypoxemia. No supplemental oxygen is required at this time.    6-Minute Walk (01/26/19): Resting Heart Rate was 86. Maximum Heart Rate achieved 97. Patient did reach target heart rate (86-133). Resting O2 Saturation was 99% on RA. Resting BORG score was 1. O2 saturation nadir was 95% at minutes 3 and 6. Total 6 minute walk distance was 350 meters. Predicted 6 minute walk distance of 558 m (LLN of 463m). Percent of predicted 6 minute walk distance of 63%. Heart rate returned to 86 bpm after 2 minutes of recovery. O2 saturation returned to 96% on RA after 2 /minutes of recovery. Maximum BORG score was 6 at minutes 5 and 6. Concluding BORG score was 1 after 2 minutes of recovery. No evidence of resting or exercise induced hypoxemia. No supplemental oxygen is required at this time.    Cultures:  ?? Source Bacterial Culture AFB Smear AFB Culture   04/02/15 Sputum 2+ OPF; 1+ Probably mPsA Negative Negative   07/02/15 Sputum 4+ OPF; 3+ mPsA; 3+ sPsA; 1+ Steno Negative Negative   01/23/16 Sputum 2+ OPF; 1+ mPsA; 1+ sPsA - -   03/24/16 Sputum 3+ OPF; 3+ mPsA; 3+ sPsA Negative Overgrown   07/21/16 Sputum 4+ OPF; 4+ mPsA; 2+ sPsA Negative Negative   12/29/16 Sputum 4+ OPF; 3+ mPsA; 3+ sPsA Negative Actinomadura sp   04/06/17 Sputum 3+ OPF; 4+ mPsA Negative Negative   07/06/17 Sputum 4+ OPF; 3+ mPsA Negative Negative   10/12/17 Sputum 4+ OPF; 3+ mPsA Negative Negative   01/18/18 Sputum OPF Negative Negative   05/17/18 Sputum 4+ OPF; 3+ mPsA - -   09/26/18 Sputum 4+ OPF; 3+ mPsA Negative Negative   01/26/19 Sputum 4+ OPF; 2+ mPsA Negative Negative   02/15/19 Sputum 3+ OPF; 3+ mPsA Negative Negative   06/06/19 Sputum 3+ OPF; 3+ sPsA; 3+ mPsA Negative Negative   11/10/19 Sputum 2+ OPF; 3+ mPsA Negative Negative   02/13/20 Sputum OPF Negative Negative   07/02/20 Sputum 3+ OPF; 1+sPsA; 3+ mPsA negative negative   10/31/20 Sputum 4+ OPF; 2+ sPsA; 4+ mPsA negative negative   11/17/20 Sputum 3+ OPF; 3+ mPsA negative negative   02/21/21 Sputum Few PsA negative In process   ??  Fungal culture 02/27/21: Pseudohyphae and yeasts.  Fungitell <31  Aspergillus Ag BAL/serum: could  not be performed because specimen too viscous for analysis.     Bronchiectasis Evaluation   IgG with subclasses:   Lab Results   Component Value Date/Time    IGGT 996 04/28/2012 06:21 AM    IGG1 402 04/28/2012 06:21 AM    IGG2 399 04/28/2012 06:21 AM    IGG3 148.0 (H) 04/28/2012 06:21 AM    IGG4 47.7 04/28/2012 06:21 AM     IgA (09/21/11): 283   IgM (09/21/11): 52   IgE:   Lab Results   Component Value Date/Time    IGE 16.6 02/13/2020 10:24 AM     Specific Titers: No results found for: TETANUSAB, TETGV, DIPHTERIAAB, DIPGV, S. pneumonia titers  HIV (05/02/20): Non-reactive  Autoimmune (05/02/20): ANA negative   ANCA: No results found for: ANCA, IFA, PR3ELISA, PR3QT, MPO, MPOQT  CBC-D (05/02/20): 6.1>14.8/44.3<156; ANC 4.1; ALC 1.2; AEC 0.17  Lab Results   Component Value Date/Time    WBC 4.1 11/20/2020 04:03 AM    WBC 6.1 11/19/2020 03:39 AM    WBC 7.7 11/18/2020 03:29 AM    WBC 7.9 08/13/2013 03:57 AM    WBC 9.4 08/12/2013 04:21 AM    WBC 11.7 (H) 08/11/2013 04:04 AM    HGB 11.8 (L) 11/20/2020 04:03 AM    HGB 14.8 03/24/2015 05:31 PM    HCT 34.5 (L) 11/20/2020 04:03 AM    HCT 30.8 (L) 08/13/2013 03:57 AM    PLT 134 (L) 11/20/2020 04:03 AM    PLT 292 08/13/2013 03:57 AM    NEUTROABS 7.8 11/17/2020 02:31 PM    NEUTROABS 3.7 02/13/2020 10:24 AM    NEUTROABS 3.2 04/19/2017 05:45 AM    NEUTROABS 4.9 06/21/2013 09:31 PM    NEUTROABS 4.6 06/21/2013 08:13 PM    NEUTROABS 4.1 04/26/2012 11:54 PM    LYMPHSABS 1.4 11/17/2020 02:31 PM    LYMPHSABS 1.4 02/13/2020 10:24 AM    LYMPHSABS 1.4 (L) 04/19/2017 05:45 AM    LYMPHSABS 1.9 06/21/2013 09:31 PM    LYMPHSABS 1.8 06/21/2013 08:13 PM    LYMPHSABS 2.8 04/26/2012 11:54 PM    EOSABS 0.2 11/17/2020 02:31 PM    EOSABS 0.2 02/13/2020 10:24 AM    EOSABS 0.2 04/19/2017 05:45 AM    EOSABS 0.2 06/21/2013 09:31 PM    EOSABS 0.2 06/21/2013 08:13 PM    EOSABS 0.3 04/26/2012 11:54 PM    BASOSABS 0.0 11/17/2020 02:31 PM    BASOSABS 0.0 06/21/2013 09:31 PM     Alpha-1 level: No results found for: A1TRYP  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      IgE (05/23/18):               Imaging  Chest CT (02/25/21): Images personally reviewed.  Stable sequelae of right middle lobectomy and right lower lobe wedge resection. 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 Stable bilateral diffuse extensive bronchiectasis, bronchial wall thickening, and sequelae of chronic endobronchial infection.        CXR (05/02/20): Images not available for review. Per report, Visualized cardiac  and mediastinal contours within normal limits allowing for mediastinal shift to the right. Circumferential right pleural fluid/thickening. Volume loss in the right lung consistent with known right lung resection. Irregular opacities throughout both lungs, right greater than left, consistent with known bronchiectasis.     Chest CT (11/10/19): Images personally reviewed. Stable sequelae of right middle lobectomy and right lower lobe wedge resection. Stable consolidation in the medial posterior right upper lobe and right lung apex, bilateral diffuse extensive bronchiectasis and bronchial wall thickening and unchanged sequelae of chronic endobronchial infection.    Chest CT (02/15/19): Images personally reviewed. Stable sequelae of right middle lobectomy and right lower lobe wedge resection.  Unchanged diffuse extensive bronchiectasis and bronchial wall thickening bilaterally.  Diffusely scattered tree-in-bud nodules in bilateral lung parenchyma with interval increase in tree-in-bud nodules in left lung; the constellation of these findings are consistent with sequelae of worsening chronic endobronchial infection.    Chest CT (03/31/16): Images personally reviewed. Status post right middle and right lower lobectomies. Tiny loculated basilar right hydropneumothorax, probably chronic. Moderate cylindrical and varicoid bronchiectasis throughout both lungs, with associated scattered mucoid impaction, tree-in-bud opacities and scattered peribronchovascular and subpleural consolidation in the right upper lobe. 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 Two-vessel coronary atherosclerosis. Nonobstructing right nephrolithiasis.    Modified Barium Swallow (01/17/19): Shallow laryngeal penetration observed with thin liquids and puree.  No aspiration identified with any of the tested consistencies.    Immunization History   Administered Date(s) Administered   ??? COVID-19 VAC,BIVALENT(43YR UP)BOOST,PFIZER 12/17/2020   ??? COVID-19 VAC,MRNA,TRIS(12Y UP)(PFIZER)(GRAY CAP) 05/03/2020   ??? COVID-19 VACC,MRNA,(PFIZER)(PF) 04/20/2019, 05/18/2019, 12/01/2019   ??? Hepatitis A 05/13/2020   ??? Hepatitis B, Adult 05/13/2020, 06/11/2020   ??? INFLUENZA TIV (TRI) 52MO+ W/ PRESERV (IM) 11/13/2010, 01/07/2012, 12/09/2017, 11/30/2019, 12/17/2020   ??? INFLUENZA TIV (TRI) PF (IM) 02/20/2012, 12/09/2012   ??? Influenza Vaccine Quad (IIV4 PF) 52mo+ injectable 01/31/2016, 12/09/2017   ??? Influenza Virus Vaccine, unspecified formulation 12/25/2014, 12/01/2016   ??? Influenza Whole 12/21/2008   ??? PNEUMOCOCCAL POLYSACCHARIDE 23 10/22/2010, 02/07/2016, 10/07/2019   ??? Pneumococcal Conjugate 13-Valent 04/28/2013   ??? TdaP 03/22/2012   ??? ZOSTAVAX - ZOSTER VACCINE, LIVE, SQ 07/03/2014, 06/11/2017, 01/19/2018     Portions of this record have been created using [employer]. Dictation errors have been sought, but may not have been identified and corrected.    Viona Gilmore, MD  03/18/21  10:00 AM

## 2021-03-20 NOTE — Unmapped (Signed)
Called [facility], no answer, left voicemail to return call back to clinic.

## 2021-03-20 NOTE — Unmapped (Signed)
Emailed lab orders to [facility] to email provided from them. Emailed to [email or web address]

## 2021-03-22 DIAGNOSIS — Z1152 Encounter for screening for COVID-19: Secondary | ICD-10-CM | POA: Diagnosis not present

## 2021-03-22 NOTE — Unmapped (Signed)
Lab orders for Aspergillus Galactomannan Antigen and CBC w diff, faxed to [facility] at [phone], and [facility] at [phone].  Spoke with nurse x 2 at [facility] who are arranging for lab collection, hopefully on 03/22/21.  Evidently [facility] did not receive lab orders emailed earlier this week.  Advised pt of same.

## 2021-03-24 DIAGNOSIS — Z1152 Encounter for screening for COVID-19: Secondary | ICD-10-CM | POA: Diagnosis not present

## 2021-03-24 DIAGNOSIS — N19 Unspecified kidney failure: Secondary | ICD-10-CM | POA: Diagnosis not present

## 2021-03-24 DIAGNOSIS — Z03818 Encounter for observation for suspected exposure to other biological agents ruled out: Secondary | ICD-10-CM | POA: Diagnosis not present

## 2021-03-25 NOTE — Unmapped (Signed)
Pt left a vm following up on a previous mychart message sent if his children would be able to wait with his wife in the waiting room for his procedure tomorrow. Mentioned wife was recently in the hospital and would like for them to be there for her when the provider comes out to give an update.    Routing to provider.

## 2021-03-25 NOTE — Unmapped (Signed)
Meredith from [facility] left a vm stating the lab was closed yesterday and she is going to get CBC lab today. Asked if provider would like to put the order as STAT since pt has a procedure tomorrow.     Routing to provider.

## 2021-03-26 ENCOUNTER — Ambulatory Visit: Admit: 2021-03-26 | Discharge: 2021-03-26 | Payer: PRIVATE HEALTH INSURANCE

## 2021-03-26 DIAGNOSIS — J479 Bronchiectasis, uncomplicated: Secondary | ICD-10-CM | POA: Diagnosis not present

## 2021-03-26 DIAGNOSIS — Z9981 Dependence on supplemental oxygen: Secondary | ICD-10-CM | POA: Diagnosis not present

## 2021-03-26 MED ADMIN — sodium chloride 10 % nebulizer solution 5 mL: 5 mL | RESPIRATORY_TRACT | @ 15:00:00 | Stop: 2021-03-26

## 2021-03-26 MED ADMIN — albuterol nebulizer solution 2.5 mg: 2.5 mg | RESPIRATORY_TRACT | @ 14:00:00 | Stop: 2021-03-26

## 2021-03-26 MED ADMIN — heparin, porcine (PF) 100 unit/mL injection: INTRAVENOUS | @ 16:00:00 | Stop: 2021-03-26

## 2021-03-26 NOTE — Unmapped (Signed)
Faxed prescription and efaxed note to [facility] for new nebulizer. Patient notified via MyChart.

## 2021-03-27 DIAGNOSIS — Z79899 Other long term (current) drug therapy: Principal | ICD-10-CM

## 2021-03-27 DIAGNOSIS — J479 Bronchiectasis, uncomplicated: Principal | ICD-10-CM

## 2021-03-27 DIAGNOSIS — B449 Aspergillosis, unspecified: Principal | ICD-10-CM

## 2021-03-27 MED ORDER — VORICONAZOLE 200 MG TABLET
ORAL_TABLET | Freq: Two times a day (BID) | ORAL | 3 refills | 90.00000 days | Status: CP
Start: 2021-03-27 — End: 2022-03-27

## 2021-03-27 MED ORDER — BUDESONIDE-FORMOTEROL HFA 80 MCG-4.5 MCG/ACTUATION AEROSOL INHALER
Freq: Two times a day (BID) | RESPIRATORY_TRACT | 11 refills | 31 days | Status: CP
Start: 2021-03-27 — End: 2022-03-27

## 2021-03-28 DIAGNOSIS — B449 Aspergillosis, unspecified: Principal | ICD-10-CM

## 2021-03-28 DIAGNOSIS — J479 Bronchiectasis, uncomplicated: Principal | ICD-10-CM

## 2021-03-28 NOTE — Unmapped (Signed)
Faxed orders for lab work to [facility] in [place]. confirmation of successful transmittal received.

## 2021-03-28 NOTE — Unmapped (Signed)
[facility] Special[facility] Pharm[facility] Refill Coordination Note    Specialty Medication(s) to be Shipped:   CF/Pulmonary: -Nebulized colistin (150mg  vials) and supply kit    Other medication(s) to be shipped: bd luer-lok syringe 20 gauge, bd posiflush normal saline 0.9%, bd regular bevel needles 21 gauge, lc plus     Jordan [name], DOB: [birth year]  Phone: [phone] (home)       All above HIPAA information was verified with patient.     Was a Nurse, learning disability used for this call? No    Completed refill call assessment today to schedule patient's medication shipment from the [facility] [phone]).  All relevant notes have been reviewed.     Specialty medication(s) and dose(s) confirmed: Regimen is correct and unchanged.   Changes to medications: Jordan [name] reports no changes at this time.  Changes to insurance: Yes: RX [employer]  New side effects reported not previously addressed with a pharmacist or physician: None reported  Questions for the pharmacist: No    Confirmed patient received a Conservation officer, historic buildings and a Surveyor, mining with first shipment. The patient will receive a drug information handout for each medication shipped and additional FDA Medication Guides as required.       DISEASE/MEDICATION-SPECIFIC INFORMATION        For CF patients: CF Healthwell [employer] Active? No-not enrolled    SPECIALTY MEDICATION ADHERENCE     Medication Adherence    Patient reported X missed doses in the last month: 0  Specialty Medication: Colistimethate 150mg  Inj  Patient is on additional specialty medications: No  Patient is on more than two specialty medications: No  Informant: patient  Reliability of informant: reliable  Reasons for non-adherence: no problems identified  Support network for adherence: family member  Confirmed plan for next specialty medication refill: delivery by pharmacy  Refills needed for supportive medications: not needed              Were doses missed due to medication being on hold? No    Colistimethate 150 mg: 10 days of medicine on hand       REFERRAL TO PHARMACIST     Referral to the pharmacist: Not needed      [facility]     Shipping address confirmed in Epic.     Delivery Scheduled: Yes, Expected medication delivery date: 04/01/21.     Medication will be delivered via UPS to the prescription address in Epic WAM.    Jordan [name]   [facility] Specialty Technician

## 2021-03-28 NOTE — Unmapped (Signed)
Fax sent for PICC removal, and confirmation of successful transmittal received.

## 2021-03-31 DIAGNOSIS — B449 Aspergillosis, unspecified: Principal | ICD-10-CM

## 2021-03-31 DIAGNOSIS — J479 Bronchiectasis, uncomplicated: Principal | ICD-10-CM

## 2021-03-31 MED ORDER — SODIUM CHLORIDE 0.9 % (FLUSH) INJECTION SYRINGE
11 refills | 0 days
Start: 2021-03-31 — End: ?

## 2021-03-31 MED ORDER — BD REGULAR BEVEL NEEDLES 21 GAUGE X 1 1/2"
3 refills | 0 days
Start: 2021-03-31 — End: ?

## 2021-03-31 MED ORDER — VORICONAZOLE 200 MG TABLET
ORAL_TABLET | Freq: Two times a day (BID) | ORAL | 3 refills | 90 days | Status: CP
Start: 2021-03-31 — End: ?

## 2021-03-31 NOTE — Unmapped (Signed)
Called patient to check on a couple things.    1. If the home health RN had taken out his PICC yet, or if they've made plans to remove it.  He said no, but that he was calling his home health RN later today & will discuss this with her then.  They should already have the removal order, but if not, I asked patient to have Sharyl Nimrod (home health RN) call me if she needs anything from that stand point.  2. His voriconazole has been sent to [facility] due to out of pocket cost.  Explained to patient & asked him to let us know if he had any challenges in getting the prescription & to let us know when he started it.  Reviewed the bloodwork protocols as well.    Patient updated that he still hadn't experienced any hemoptysis since last Tuesday.  Regarding any potentially needed embolization, he leaves that decision to Dr Garner Nash, as he trusts her judgment.    Patient verbalized understanding of plan outlined in conversation & will update me as needed.

## 2021-04-01 DIAGNOSIS — J479 Bronchiectasis, uncomplicated: Principal | ICD-10-CM

## 2021-04-01 MED ORDER — SODIUM CHLORIDE 0.9 % (FLUSH) INJECTION SYRINGE
11 refills | 0 days | Status: CP
Start: 2021-04-01 — End: ?
  Filled 2021-04-01: qty 60, 30d supply, fill #0
  Filled 2021-04-01: qty 180, 30d supply, fill #0

## 2021-04-01 MED ORDER — BD REGULAR BEVEL NEEDLES 21 GAUGE X 1 1/2"
INTRAMUSCULAR | 3 refills | 0.00000 days | Status: CP
Start: 2021-04-01 — End: ?
  Filled 2021-04-01: qty 60, 30d supply, fill #0

## 2021-04-01 MED FILL — COLISTIN (COLISTIMETHATE SODIUM) 150 MG SOLUTION FOR INJECTION: 30 days supply | Qty: 60 | Fill #4

## 2021-04-01 MED FILL — LC PLUS MISC: 30 days supply | Qty: 1 | Fill #2

## 2021-04-02 DIAGNOSIS — J479 Bronchiectasis, uncomplicated: Secondary | ICD-10-CM | POA: Diagnosis not present

## 2021-04-02 NOTE — Unmapped (Signed)
Please disregard note, myChart message sent to patient regarding voriconazole

## 2021-04-02 NOTE — Unmapped (Signed)
Ascertained that [facility] did not run the ordered Aspergillus Galactomannan Antigen from 1/2.  Called [facility] to request the lab be drawn from pt's PICC prior to being pulled today.  Verbal order given as well as order faxed and confirmation of successful transmittal received.    Spoke to patient about plan to get labs drawn prior to PICC removal.    Per [facility] rep, expect results in 7-10days.

## 2021-04-10 LAB — FUNGUS CULTURE WITH STAIN

## 2021-04-10 LAB — FUNGUS CULTURE RESULT

## 2021-04-10 LAB — FUNGAL ORGANISM REFLEX

## 2021-04-11 ENCOUNTER — Other Ambulatory Visit (HOSPITAL_COMMUNITY)
Admission: AD | Admit: 2021-04-11 | Discharge: 2021-04-11 | Disposition: A | Discharge status: Home / Self Care | Payer: PPO | Source: Ambulatory Visit | Attending: *Deleted | Admitting: *Deleted

## 2021-04-11 DIAGNOSIS — Z79899 Other long term (current) drug therapy: Secondary | ICD-10-CM | POA: Diagnosis not present

## 2021-04-11 LAB — HEPATIC FUNCTION PANEL
ALT: 28 U/L (ref 0–44)
AST: 21 U/L (ref 15–41)
Albumin: 3.4 g/dL — ABNORMAL LOW (ref 3.5–5.0)
Alkaline Phosphatase: 69 U/L (ref 38–126)
Bilirubin, Direct: <0.1 mg/dL (ref 0.0–0.2)
Total Bilirubin: 0.5 mg/dL (ref 0.3–1.2)
Total Protein: 7.1 g/dL (ref 6.5–8.1)

## 2021-04-11 NOTE — Unmapped (Signed)
[facility] Specialty Pharmacy Refill Coordination Note    Specialty Medication(s) to be Shipped:   CF/Pulmonary: -Generic inhaled tobramycin 300mg /11mL inhalation solution  Other medication(s) to be shipped: Sodium Chloride 10% neb solu & LC Plus neb cups   **Pt currently on Colistimethate til 04/22/21 & then will start tobramycin on 02/01/23[name], DOB: 02-07-1956  Phone: [phone] (home)     All above HIPAA information was verified with patient.     Was a Nurse, learning disability used for this call? No    Completed refill call assessment today to schedule patient's medication shipment from the [facility] [phone]).  All relevant notes have been reviewed.     Specialty medication(s) and dose(s) confirmed: Regimen is correct and unchanged.   Changes to medications: Zandyr reports no changes at this time.  Changes to insurance: No  New side effects reported not previously addressed with a pharmacist or physician: None reported  Questions for the pharmacist: No    Confirmed patient received a Conservation officer, historic buildings and a Surveyor, mining with first shipment. The patient will receive a drug information handout for each medication shipped and additional FDA Medication Guides as required.       DISEASE/MEDICATION-SPECIFIC INFORMATION        For CF patients: CF [employer]well [employer] Active? No-not enrolled    SPECIALTY MEDICATION ADHERENCE     Medication Adherence    Patient reported X missed doses in the last month: 0  Specialty Medication: Colistimethate 150mg   Patient is on additional specialty medications: Yes  Additional Specialty Medications: Tobramycin 300mg /23ml Neb solu  Patient Reported Additional Medication X Missed Doses in the Last Month: 0  Patient is on more than two specialty medications: No  Informant: patient  Reliability of informant: reliable  Reasons for non-adherence: no problems identified  Support network for adherence: family member  Confirmed plan for next specialty medication refill: delivery by pharmacy  Refills needed for supportive medications: not needed        Were doses missed due to medication being on hold? No    Toramycin 300mg /6ml neb solu : 0 days of medicine on hand (Start date: 04/23/21)    REFERRAL TO PHARMACIST     Referral to the pharmacist: Not needed    [facility]     Shipping address confirmed in Epic.     Delivery Scheduled: Yes, Expected medication delivery date: 04/22/2021.     Medication will be delivered via UPS to the prescription address in Epic WAM.    Stefen Juba P Wetzel [facility] [facility] Specialty Technician

## 2021-04-14 NOTE — Unmapped (Signed)
Called [facility] to check on pt's Voriconazole level that was to be drawn last week. Lab states the hepatic function panel was the only lab they had orders for. Verified that all lab orders had previously been sent together. Attempted to re-fax Vori levels orders multiple times- electronically and via actual fax machine, to 2 different numbers: [phone] & [phone] without success. Called lab [phone]) & attempted to ascertain an email address or if verbal order was needed. Lab member put orders in for vori & hfp to be drawn this Friday, however the serial labs do not seem to be in their system. Will attempt to further fax & will follow up.

## 2021-04-14 NOTE — Unmapped (Addendum)
[facility] Therapeutic Monitoring Pharmacy Note    Jordan [name] is a 66 y.o. male starting voriconazole.     Indication: Treatment of aspergilloma    Prior Dosing Information: Not applicable - new initiation    Goals:  Therapeutic Drug Levels  Trough level: 1-30mcg/mL  ?? >1 mcg/ml: okay for most indications  ?? >2 mcg/ml: consider for invasive mold infections (including aspergillosis), CNS  infections, elevated MICs, or disseminated infections    Additional Clinical Monitoring/Outcomes  Monitor QTc, renal function (SCr and UOP), and liver function (LFTs)    Results:  Voriconazole level: not drawn on 04/11/21    Longitudinal Dose Monitoring:  Date Dose Level  (mcg/mL) Key Drug Interactions   04/11/21 200mg  PO BID Not drawn  N/A (Symbicort dose reduced)     Wt Readings from Last 3 Encounters:   03/18/21 80.1 kg (176 lb 9.4 oz)   03/18/21 83 kg (183 lb)   11/18/20 80.1 kg (176 lb 9.4 oz)     Lab Results   Component Value Date    BILITOT 0.5 04/11/2021    ALBUMIN 3.4 (L) 04/11/2021    ALT 28 04/11/2021    AST 21 04/11/2021     Assessment/Plan:  Recommendation(s)  ??? Baseline QTC on 8/28/222, will continue to monitor periodically during treatment  ??? LFTs WNL, will request CMP to monitor renal function and electrolytes  ??? Pt to get voriconazole level this Friday, 04/18/21  ??? Continue current regimen of voriconazole 200 mg PO BID    Follow-up  ??? Next level should be ordered on 04/18/21, 1 hour prior to dose    Electronically signed by:  Alben Spittle, PharmD, BCACP, CPP  Clinical Pharmacist Practitioner  [facility]  [phone]

## 2021-04-15 ENCOUNTER — Ambulatory Visit: Admit: 2021-04-15 | Discharge: 2021-04-15 | Payer: PRIVATE HEALTH INSURANCE

## 2021-04-15 DIAGNOSIS — R918 Other nonspecific abnormal finding of lung field: Secondary | ICD-10-CM | POA: Diagnosis not present

## 2021-04-15 DIAGNOSIS — J479 Bronchiectasis, uncomplicated: Secondary | ICD-10-CM | POA: Diagnosis not present

## 2021-04-15 DIAGNOSIS — I771 Stricture of artery: Secondary | ICD-10-CM | POA: Diagnosis not present

## 2021-04-15 DIAGNOSIS — I7789 Other specified disorders of arteries and arterioles: Secondary | ICD-10-CM | POA: Diagnosis not present

## 2021-04-15 DIAGNOSIS — R590 Localized enlarged lymph nodes: Secondary | ICD-10-CM | POA: Diagnosis not present

## 2021-04-15 DIAGNOSIS — Z9889 Other specified postprocedural states: Secondary | ICD-10-CM | POA: Diagnosis not present

## 2021-04-15 DIAGNOSIS — R042 Hemoptysis: Secondary | ICD-10-CM | POA: Diagnosis not present

## 2021-04-15 MED ADMIN — iohexoL (OMNIPAQUE) 350 mg iodine/mL solution 75 mL: 75 mL | INTRAVENOUS | @ 21:00:00 | Stop: 2021-04-15

## 2021-04-15 NOTE — Unmapped (Signed)
Faxed orders for vori levels and HFP. Confirmation of successful transmittal rec'd.

## 2021-04-16 DIAGNOSIS — Z79899 Other long term (current) drug therapy: Principal | ICD-10-CM

## 2021-04-16 NOTE — Unmapped (Signed)
Faxed serial orders for comprehensive metabolic panel to [facility]. Confirmation of successful transmittal rec'd.

## 2021-04-17 DIAGNOSIS — D485 Neoplasm of uncertain behavior of skin: Secondary | ICD-10-CM | POA: Diagnosis not present

## 2021-04-17 DIAGNOSIS — D225 Melanocytic nevi of trunk: Secondary | ICD-10-CM | POA: Diagnosis not present

## 2021-04-17 DIAGNOSIS — L812 Freckles: Secondary | ICD-10-CM | POA: Diagnosis not present

## 2021-04-17 DIAGNOSIS — L570 Actinic keratosis: Secondary | ICD-10-CM | POA: Diagnosis not present

## 2021-04-17 DIAGNOSIS — Z85828 Personal history of other malignant neoplasm of skin: Secondary | ICD-10-CM | POA: Diagnosis not present

## 2021-04-17 DIAGNOSIS — D1801 Hemangioma of skin and subcutaneous tissue: Secondary | ICD-10-CM | POA: Diagnosis not present

## 2021-04-17 DIAGNOSIS — L820 Inflamed seborrheic keratosis: Secondary | ICD-10-CM | POA: Diagnosis not present

## 2021-04-17 DIAGNOSIS — C44329 Squamous cell carcinoma of skin of other parts of face: Secondary | ICD-10-CM | POA: Diagnosis not present

## 2021-04-17 LAB — ACID FAST CULTURE WITH REFLEXED SENSITIVITIES (MYCOBACTERIA): Acid Fast Culture: NEGATIVE

## 2021-04-17 NOTE — Unmapped (Signed)
Called patient to remind him of labs tomorrow. Reiterated that his Voriconazole level needs to be done then & discussed the importance of timing. Pt verbalized understanding and said he will reinforce tomorrow at lab about vori level in particular.

## 2021-04-18 ENCOUNTER — Other Ambulatory Visit (HOSPITAL_COMMUNITY)
Admission: RE | Admit: 2021-04-18 | Discharge: 2021-04-18 | Disposition: A | Discharge status: Home / Self Care | Payer: PPO | Source: Ambulatory Visit | Attending: *Deleted | Admitting: *Deleted

## 2021-04-18 DIAGNOSIS — Z79899 Other long term (current) drug therapy: Secondary | ICD-10-CM | POA: Diagnosis not present

## 2021-04-18 LAB — COMPREHENSIVE METABOLIC PANEL
ALT: 26 U/L (ref 0–44)
AST: 23 U/L (ref 15–41)
Albumin: 3.7 g/dL (ref 3.5–5.0)
Alkaline Phosphatase: 72 U/L (ref 38–126)
Anion gap: 6 (ref 5–15)
BUN: 25 mg/dL — ABNORMAL HIGH (ref 8–23)
CO2: 26 mmol/L (ref 22–32)
Calcium: 8.7 mg/dL — ABNORMAL LOW (ref 8.9–10.3)
Chloride: 106 mmol/L (ref 98–111)
Creatinine, Ser: 1.07 mg/dL (ref 0.61–1.24)
GFR, Estimated: >60 mL/min (ref >60)
Glucose, Bld: 92 mg/dL (ref 70–99)
Potassium: 4.6 mmol/L (ref 3.5–5.1)
Sodium: 138 mmol/L (ref 135–145)
Total Bilirubin: 0.4 mg/dL (ref 0.3–1.2)
Total Protein: 7.5 g/dL (ref 6.5–8.1)

## 2021-04-21 DIAGNOSIS — J479 Bronchiectasis, uncomplicated: Principal | ICD-10-CM

## 2021-04-21 MED ORDER — SODIUM CHLORIDE 10 % FOR NEBULIZATION
Freq: Two times a day (BID) | RESPIRATORY_TRACT | 3 refills | 90 days | Status: CP
Start: 2021-04-21 — End: ?
  Filled 2021-04-21: qty 900, 30d supply, fill #0

## 2021-04-21 MED FILL — LC PLUS MISC: 30 days supply | Qty: 2 | Fill #0

## 2021-04-21 MED FILL — TOBRAMYCIN 300 MG/5 ML IN 0.225 % SODIUM CHLORIDE FOR NEBULIZATION: RESPIRATORY_TRACT | 56 days supply | Qty: 280 | Fill #2

## 2021-04-22 NOTE — Unmapped (Signed)
Patient's voriconazole level yet again not drawn despite orders being faxed and confirmation of successful transmittal. Re faxed orders, as well as detailed instructions regarding collections:  Labs drawn on Fridays  04/25/21: CMP & Voriconazole level (obtain 1 hour prior to dose)  05/02/21: CMP  05/30/21:CMP (now every 4 weeks x12), Voriconazole level (obtain 1 hour prior to dose. now every 4 weeks x12)    Notified MD, CPP, and patient of same.

## 2021-04-24 NOTE — Unmapped (Addendum)
Called patient to notify that he still needs his voriconazole level drawn. I have sent detailed instructions to patient, and he will have that in hand when he goes tomorrow, in the event there are issues or questions.    Unrelated, patient states he was recently dx with [facility] on his face & plans to have it removed on 2/28.

## 2021-04-25 ENCOUNTER — Other Ambulatory Visit (HOSPITAL_COMMUNITY)
Admission: RE | Admit: 2021-04-25 | Discharge: 2021-04-25 | Disposition: A | Discharge status: Home / Self Care | Payer: PPO | Source: Ambulatory Visit | Attending: *Deleted | Admitting: *Deleted

## 2021-04-25 DIAGNOSIS — B449 Aspergillosis, unspecified: Secondary | ICD-10-CM | POA: Insufficient documentation

## 2021-04-25 DIAGNOSIS — Z79899 Other long term (current) drug therapy: Secondary | ICD-10-CM | POA: Diagnosis not present

## 2021-04-25 LAB — COMPREHENSIVE METABOLIC PANEL
ALT: 27 U/L (ref 0–44)
AST: 20 U/L (ref 15–41)
Albumin: 3.7 g/dL (ref 3.5–5.0)
Alkaline Phosphatase: 79 U/L (ref 38–126)
Anion gap: 7 (ref 5–15)
BUN: 23 mg/dL (ref 8–23)
CO2: 25 mmol/L (ref 22–32)
Calcium: 8.9 mg/dL (ref 8.9–10.3)
Chloride: 105 mmol/L (ref 98–111)
Creatinine, Ser: 1.09 mg/dL (ref 0.61–1.24)
GFR, Estimated: >60 mL/min (ref >60)
Glucose, Bld: 90 mg/dL (ref 70–99)
Potassium: 4.4 mmol/L (ref 3.5–5.1)
Sodium: 137 mmol/L (ref 135–145)
Total Bilirubin: 0.3 mg/dL (ref 0.3–1.2)
Total Protein: 7.3 g/dL (ref 6.5–8.1)

## 2021-04-28 NOTE — Unmapped (Signed)
[facility]ed [facility] to inquire about Voriconazole level. States it is a send out, it was collected on 2/3 & will be resulted by [employer].  Per [employer], TAT is 3-7 days for Vori level. None noted in their system as of current.

## 2021-04-29 LAB — VORICONAZOLE, SERUM: Voriconazole, Serum: 0.6 ug/mL

## 2021-04-30 DIAGNOSIS — B449 Aspergillosis, unspecified: Principal | ICD-10-CM

## 2021-04-30 DIAGNOSIS — Z79899 Other long term (current) drug therapy: Principal | ICD-10-CM

## 2021-04-30 MED ORDER — VORICONAZOLE 200 MG TABLET
ORAL_TABLET | Freq: Two times a day (BID) | ORAL | 3 refills | 60 days | Status: CP
Start: 2021-04-30 — End: ?

## 2021-04-30 NOTE — Unmapped (Signed)
[facility] Therapeutic Monitoring Pharmacy Note    Jordan [name] is a 66 y.o. male starting voriconazole.     Indication: Treatment of aspergilloma    Prior Dosing Information: Not applicable - new initiation    Goals:  Therapeutic Drug Levels  Trough level: 1-54mcg/mL  ?? >1 mcg/ml: okay for most indications  ?? >2 mcg/ml: consider for invasive mold infections (including aspergillosis), CNS  infections, elevated MICs, or disseminated infections     Additional Clinical Monitoring/Outcomes  Monitor QTc, renal function (SCr and UOP), and liver function (LFTs)    Results:  Voriconazole level: not drawn on 04/11/21    Longitudinal Dose Monitoring:  Date Dose Level  (mcg/mL) Key Drug Interactions   04/11/21 200mg  PO BID Not drawn  N/A (Symbicort dose reduced)   04/18/21 200mg  PO BID Not drawn N/A (Symbicort dose reduced)   04/25/21 200mg  PO BID 0.6 N/A (Symbicort dose reduced)     Wt Readings from Last 3 Encounters:   03/18/21 80.1 kg (176 lb 9.4 oz)   03/18/21 83 kg (183 lb)   11/18/20 80.1 kg (176 lb 9.4 oz)     Lab Results   Component Value Date    BILITOT 0.3 04/25/21    ALBUMIN 3.7 04/25/21    ALT 27 04/25/21    AST 20 04/25/21     Assessment/Plan:  Recommendation(s)  ??? Baseline QTC on 8/28/222, will continue to monitor periodically during treatment  ??? LFTs WNL. Creatinine stable  ??? Increase voriconazole to 300 mg PO BID    Follow-up  ??? Next level should be ordered 1 week after dose increase, 1 hour prior to dose    Electronically signed by:  Alben Spittle, PharmD, BCACP, CPP  Clinical Pharmacist Practitioner  [facility]  [phone]

## 2021-05-01 NOTE — Unmapped (Signed)
Fax sent for Voriconazole level to check ~ 1 week after increasing dose.  Confirmation of successful transmittal received.

## 2021-05-02 ENCOUNTER — Other Ambulatory Visit (HOSPITAL_COMMUNITY)
Admission: RE | Admit: 2021-05-02 | Discharge: 2021-05-02 | Disposition: A | Discharge status: Home / Self Care | Payer: PPO | Source: Ambulatory Visit | Attending: *Deleted | Admitting: *Deleted

## 2021-05-02 DIAGNOSIS — Z79899 Other long term (current) drug therapy: Secondary | ICD-10-CM | POA: Diagnosis not present

## 2021-05-02 DIAGNOSIS — Z01812 Encounter for preprocedural laboratory examination: Secondary | ICD-10-CM | POA: Insufficient documentation

## 2021-05-02 LAB — COMPREHENSIVE METABOLIC PANEL
ALT: 22 U/L (ref 0–44)
AST: 21 U/L (ref 15–41)
Albumin: 3.4 g/dL — ABNORMAL LOW (ref 3.5–5.0)
Alkaline Phosphatase: 83 U/L (ref 38–126)
Anion gap: 9 (ref 5–15)
BUN: 31 mg/dL — ABNORMAL HIGH (ref 8–23)
CO2: 25 mmol/L (ref 22–32)
Calcium: 8.8 mg/dL — ABNORMAL LOW (ref 8.9–10.3)
Chloride: 106 mmol/L (ref 98–111)
Creatinine, Ser: 1.26 mg/dL — ABNORMAL HIGH (ref 0.61–1.24)
GFR, Estimated: >60 mL/min (ref >60)
Glucose, Bld: 95 mg/dL (ref 70–99)
Potassium: 4.1 mmol/L (ref 3.5–5.1)
Sodium: 140 mmol/L (ref 135–145)
Total Bilirubin: 0.5 mg/dL (ref 0.3–1.2)
Total Protein: 7.1 g/dL (ref 6.5–8.1)

## 2021-05-06 NOTE — Unmapped (Unsigned)
[facility] Therapeutic Monitoring Pharmacy Note    Jordan [name] is a 66 y.o. male continuing voriconazole.     Indication: Treatment of aspergilloma    Prior Dosing Information: Current regimen 300mg  PO BID    Goals:  Therapeutic Drug Levels  Trough level: 1-65mcg/mL  ?? >1 mcg/ml: okay for most indications  ?? >2 mcg/ml: consider for invasive mold infections (including aspergillosis), CNS  infections, elevated MICs, or disseminated infections     Additional Clinical Monitoring/Outcomes  Monitor QTc, renal function (SCr and UOP), and liver function (LFTs)    Results:  Voriconazole level: not drawn on 04/11/21    Longitudinal Dose Monitoring:  Date Dose Level  (mcg/mL) Key Drug Interactions   04/11/21 200mg  PO BID Not drawn  N/A (Symbicort dose reduced)   04/18/21 200mg  PO BID Not drawn N/A (Symbicort dose reduced)   04/25/21 200mg  PO BID 0.6 N/A (Symbicort dose reduced)     Wt Readings from Last 3 Encounters:   03/18/21 80.1 kg (176 lb 9.4 oz)   03/18/21 83 kg (183 lb)   11/18/20 80.1 kg (176 lb 9.4 oz)     Lab Results   Component Value Date    BILITOT 0.3 04/25/21    ALBUMIN 3.7 04/25/21    ALT 27 04/25/21    AST 20 04/25/21     Assessment/Plan:  Recommendation(s)  ??? Baseline QTC on 8/28/222, will continue to monitor periodically during treatment  ??? LFTs WNL. Creatinine increased, likely due to being dehydrated.  ??? Pt will be getting voriconazole level at higher dose this week  ??? Continue voriconazole to 300 mg PO BID pending level    Follow-up  ??? Next level will take place this week    Electronically signed by:  Alben Spittle, PharmD, BCACP, CPP  Clinical Pharmacist Practitioner  [facility]  [phone]

## 2021-05-08 ENCOUNTER — Other Ambulatory Visit (HOSPITAL_COMMUNITY)
Admission: AD | Admit: 2021-05-08 | Discharge: 2021-05-08 | Disposition: A | Discharge status: Home / Self Care | Payer: PPO | Source: Ambulatory Visit | Attending: *Deleted | Admitting: *Deleted

## 2021-05-08 DIAGNOSIS — Z79899 Other long term (current) drug therapy: Secondary | ICD-10-CM | POA: Insufficient documentation

## 2021-05-08 DIAGNOSIS — B441 Other pulmonary aspergillosis: Secondary | ICD-10-CM | POA: Insufficient documentation

## 2021-05-08 DIAGNOSIS — Z5181 Encounter for therapeutic drug level monitoring: Secondary | ICD-10-CM | POA: Diagnosis not present

## 2021-05-10 LAB — VORICONAZOLE, SERUM: Voriconazole, Serum: 0.9 ug/mL

## 2021-05-12 DIAGNOSIS — B449 Aspergillosis, unspecified: Principal | ICD-10-CM

## 2021-05-12 MED ORDER — VORICONAZOLE 200 MG TABLET
ORAL_TABLET | Freq: Two times a day (BID) | ORAL | 3 refills | 30.00000 days | Status: CP
Start: 2021-05-12 — End: ?

## 2021-05-12 NOTE — Unmapped (Signed)
[facility] Therapeutic Monitoring Pharmacy Note    Jordan [name] is a 66 y.o. male continuing voriconazole. Last CMP completed 05/02/21. See Care Everywhere. Last Voriconazole level competed 05/08/21 after dose increase. See Media tab     Indication: Treatment of aspergilloma    Prior Dosing Information: Current regimen 300mg  PO BID - dose increase as of 04/30/21    Goals:  Therapeutic Drug Levels  Trough level: 1-57mcg/mL  ?? >1 mcg/ml: okay for most indications  ?? >2 mcg/ml: consider for invasive mold infections (including aspergillosis), CNS  infections, elevated MICs, or disseminated infections     Additional Clinical Monitoring/Outcomes  Monitor QTc, renal function (SCr and UOP), and liver function (LFTs)    Results:  Voriconazole level: 0.9 micrograms/mL 05/08/21    Longitudinal Dose Monitoring:  Date Dose Level  (mcg/mL) Key Drug Interactions   04/11/21 200mg  PO BID Not drawn  N/A (Symbicort dose reduced)   04/18/21 200mg  PO BID Not drawn N/A (Symbicort dose reduced)   04/25/21 200mg  PO BID 0.6 N/A (Symbicort dose reduced)   05/08/21 300mg  PO BID 0.9 N/A (Symbicort dose reduced)     Wt Readings from Last 3 Encounters:   03/18/21 80.1 kg (176 lb 9.4 oz)   03/18/21 83 kg (183 lb)   11/18/20 80.1 kg (176 lb 9.4 oz)     Lab Results   Component Value Date    BILITOT 0.3 04/25/21    ALBUMIN 3.7 04/25/21    ALT 27 04/25/21    AST 20 04/25/21        Lab Results   Component Value Date    CREATININE 1.26 05/02/21         Assessment/Plan:  Recommendation(s)  ??? Baseline QTC on 8/28/222, will continue to monitor periodically during treatment  ??? CMP collected on 05/02/21: LFTs WNL. Creatinine increased, likely due to being dehydrated.  ??? Voriconazole level continues to be SUBtherapeutic. Spoke with patient and he confirmed taking 300mg  BID since Wed Feb 8th. Level was drawn appropriately on 05/08/21. Denies any new medications since starting voriconazole. Denies any audio or visual hallucinations or changes to vision. Counseled on photosensitivity.  ??? Increase voriconazole to 400 mg PO BID, repeat level next week       Follow-up  ??? Next level will take place this week, 05/19/21 with CMP and vori level     Electronically signed by:  Alben Spittle, PharmD, BCACP, CPP  Clinical Pharmacist Practitioner  [facility] Adult Cystic Fibrosis/Pulmonary Clinic  [phone]

## 2021-05-13 NOTE — Unmapped (Signed)
Fax sent for lab work.  Confirmation of successful transmittal received.

## 2021-05-19 NOTE — Unmapped (Signed)
[facility] Refill Coordination Note    Specialty Medication(s) to be Shipped:   CF/Pulmonary: -Colistimethate 150mg  Inj    Other medication(s) to be shipped: Syringes, Saline, LC PLUS and Needles     Jordan [name], DOB: Sep 28, 1955  Phone: [phone] (home)       All above HIPAA information was verified with patient.     Was a Nurse, learning disability used for this call? No    Completed refill call assessment today to schedule patient's medication shipment from the [facility] [phone]).  All relevant notes have been reviewed.     Specialty medication(s) and dose(s) confirmed: Regimen is correct and unchanged.   Changes to medications: Jeb reports no changes at this time.  Changes to insurance: No  New side effects reported not previously addressed with a pharmacist or physician: None reported  Questions for the pharmacist: No    Confirmed patient received a Conservation officer, historic buildings and a Surveyor, mining with first shipment. The patient will receive a drug information handout for each medication shipped and additional FDA Medication Guides as required.       DISEASE/MEDICATION-SPECIFIC INFORMATION        N/A    SPECIALTY MEDICATION ADHERENCE     Medication Adherence    Patient reported X missed doses in the last month: 0  Specialty Medication: Tobramycin 300mg /41ml Neb solu  Patient is on additional specialty medications: Yes  Additional Specialty Medications: Colistimethate 150mg  Inj  Patient Reported Additional Medication X Missed Doses in the Last Month: 0  Patient is on more than two specialty medications: No  Informant: patient  Reliability of informant: reliable  Reasons for non-adherence: no problems identified  Support network for adherence: family member  Confirmed plan for next specialty medication refill: delivery by pharmacy  Refills needed for supportive medications: not needed              Were doses missed due to medication being on hold? No    Colistimethate 150mg  Inj: 0 days of medicine on hand.  New cycle start 05/21/21.     REFERRAL TO PHARMACIST     Referral to the pharmacist: Not needed      [facility]     Shipping address confirmed in Epic.     Delivery Scheduled: Yes, Expected medication delivery date: 05/21/21.     Medication will be delivered via UPS to the prescription address in Epic [place].    Jordan [name]   [facility] Specialty Technician

## 2021-05-20 ENCOUNTER — Other Ambulatory Visit (HOSPITAL_COMMUNITY)
Admission: RE | Admit: 2021-05-20 | Discharge: 2021-05-20 | Disposition: A | Discharge status: Home / Self Care | Payer: PPO | Source: Ambulatory Visit | Attending: *Deleted | Admitting: *Deleted

## 2021-05-20 DIAGNOSIS — C44329 Squamous cell carcinoma of skin of other parts of face: Secondary | ICD-10-CM | POA: Diagnosis not present

## 2021-05-20 DIAGNOSIS — B449 Aspergillosis, unspecified: Secondary | ICD-10-CM | POA: Insufficient documentation

## 2021-05-20 DIAGNOSIS — Z85828 Personal history of other malignant neoplasm of skin: Secondary | ICD-10-CM | POA: Diagnosis not present

## 2021-05-20 DIAGNOSIS — Z79899 Other long term (current) drug therapy: Secondary | ICD-10-CM | POA: Insufficient documentation

## 2021-05-20 LAB — COMPREHENSIVE METABOLIC PANEL
ALT: 18 U/L (ref 0–44)
AST: 20 U/L (ref 15–41)
Albumin: 3.9 g/dL (ref 3.5–5.0)
Alkaline Phosphatase: 79 U/L (ref 38–126)
Anion gap: 7 (ref 5–15)
BUN: 27 mg/dL — ABNORMAL HIGH (ref 8–23)
CO2: 25 mmol/L (ref 22–32)
Calcium: 8.8 mg/dL — ABNORMAL LOW (ref 8.9–10.3)
Chloride: 105 mmol/L (ref 98–111)
Creatinine, Ser: 1.19 mg/dL (ref 0.61–1.24)
GFR, Estimated: >60 mL/min (ref >60)
Glucose, Bld: 98 mg/dL (ref 70–99)
Potassium: 4.2 mmol/L (ref 3.5–5.1)
Sodium: 137 mmol/L (ref 135–145)
Total Bilirubin: 0.5 mg/dL (ref 0.3–1.2)
Total Protein: 7.5 g/dL (ref 6.5–8.1)

## 2021-05-20 MED FILL — BD REGULAR BEVEL NEEDLES 21 GAUGE X 1 1/2": 30 days supply | Qty: 60 | Fill #1

## 2021-05-20 MED FILL — COLISTIN (COLISTIMETHATE SODIUM) 150 MG SOLUTION FOR INJECTION: 30 days supply | Qty: 60 | Fill #5

## 2021-05-20 MED FILL — BD LUER-LOK SYRINGE 3 ML 21 GAUGE X 1": 30 days supply | Qty: 60 | Fill #1

## 2021-05-20 MED FILL — LC PLUS MISC: 30 days supply | Qty: 2 | Fill #1

## 2021-05-20 MED FILL — BD POSIFLUSH NORMAL SALINE 0.9 % INJECTION SYRINGE: 30 days supply | Qty: 180 | Fill #1

## 2021-05-22 LAB — VORICONAZOLE, SERUM: Voriconazole, Serum: 1.9 ug/mL

## 2021-05-22 NOTE — Unmapped (Signed)
Chester from [employer] left a vm stating he will be sending a fax to retrieve information to get approval for the patients medical equipment.

## 2021-05-26 NOTE — Unmapped (Signed)
Faxed requested CMN for oxygen to [facility].

## 2021-05-27 NOTE — Unmapped (Signed)
[facility] Therapeutic Monitoring Pharmacy Note    Jordan [name] is a 66 y.o. male continuing voriconazole. Last CMP completed 05/20/21. See Care Everywhere. Last Voriconazole level competed 05/20/21 after dose increase. See Media tab     Indication: Treatment of aspergilloma    Prior Dosing Information: Current regimen 400mg  PO BID - dose increase as of 05/12/21    Goals:  Therapeutic Drug Levels  Trough level: 1-21mcg/mL  ?? >1 mcg/ml: okay for most indications  ?? >2 mcg/ml: consider for invasive mold infections (including aspergillosis), CNS  infections, elevated MICs, or disseminated infections     Additional Clinical Monitoring/Outcomes  Monitor QTc, renal function (SCr and UOP), and liver function (LFTs)    Results:  Voriconazole level: 0.9 micrograms/mL 05/08/21    Longitudinal Dose Monitoring:  Date Dose Level  (mcg/mL) Key Drug Interactions   04/11/21 200mg  PO BID Not drawn  N/A (Symbicort dose reduced)   04/18/21 200mg  PO BID Not drawn N/A (Symbicort dose reduced)   04/25/21 200mg  PO BID 0.6 N/A (Symbicort dose reduced)   05/08/21 300mg  PO BID 0.9 N/A (Symbicort dose reduced)   05/20/21 400mg  PO BID 1.9 N/A (Symbicort dose reduced)     Wt Readings from Last 3 Encounters:   03/18/21 80.1 kg (176 lb 9.4 oz)   03/18/21 83 kg (183 lb)   11/18/20 80.1 kg (176 lb 9.4 oz)     Lab Results   Component Value Date    BILITOT 0.5 05/20/2021    ALBUMIN 3.9 05/20/2021    ALT 18 05/20/2021    AST 20 05/20/2021        Lab Results   Component Value Date    CREATININE 1.19 05/20/2021         Assessment/Plan:  Recommendation(s)  ??? Baseline QTC on 8/28/222  ??? CMP collected on 05/20/21: LFTs WNL. Creatinine improved.  ??? Voriconazole level very slightly SUBtherapeutic. Discussed current level with provider and will Continue voriconazole to 400 mg PO BID. Would like EKG now that pt has reached target dose.      Follow-up  ??? Next level will take place in 1 month    Electronically signed by:  Alben Spittle, PharmD, BCACP, CPP  Clinical Pharmacist Practitioner  [facility] Adult Cystic Fibrosis/Pulmonary Clinic  [phone]

## 2021-05-30 ENCOUNTER — Other Ambulatory Visit (HOSPITAL_COMMUNITY)
Admission: AD | Admit: 2021-05-30 | Discharge: 2021-05-30 | Disposition: A | Discharge status: Home / Self Care | Payer: PPO | Source: Ambulatory Visit | Attending: *Deleted | Admitting: *Deleted

## 2021-05-30 DIAGNOSIS — Z79899 Other long term (current) drug therapy: Secondary | ICD-10-CM | POA: Diagnosis not present

## 2021-05-30 DIAGNOSIS — B449 Aspergillosis, unspecified: Secondary | ICD-10-CM | POA: Insufficient documentation

## 2021-05-30 LAB — COMPREHENSIVE METABOLIC PANEL
ALT: 15 U/L (ref 0–44)
AST: 19 U/L (ref 15–41)
Albumin: 3.7 g/dL (ref 3.5–5.0)
Alkaline Phosphatase: 75 U/L (ref 38–126)
Anion gap: 7 (ref 5–15)
BUN: 26 mg/dL — ABNORMAL HIGH (ref 8–23)
CO2: 24 mmol/L (ref 22–32)
Calcium: 8.6 mg/dL — ABNORMAL LOW (ref 8.9–10.3)
Chloride: 106 mmol/L (ref 98–111)
Creatinine, Ser: 1.05 mg/dL (ref 0.61–1.24)
GFR, Estimated: >60 mL/min (ref >60)
Glucose, Bld: 92 mg/dL (ref 70–99)
Potassium: 4.6 mmol/L (ref 3.5–5.1)
Sodium: 137 mmol/L (ref 135–145)
Total Bilirubin: 0.3 mg/dL (ref 0.3–1.2)
Total Protein: 7.4 g/dL (ref 6.5–8.1)

## 2021-06-03 NOTE — Unmapped (Signed)
[facility] - INITIAL VISIT AND CONSULTATION    Patient Name: Jordan [name]  Patient Age: 66 y.o.  Encounter Date: 06/03/2021    REFERRING PHYSICIAN: Truett Mainland, MD  [address]  [address]  [place],  [place] [address]    PRIMARY CARE PROVIDER: Barbaraann Boys, MD      Subjective:     Chief Complaint: Hemoptysis    History of Present Illness:  Jordan [name] is a 66 y.o. male who is seen at the request of Beatriz Stallion* for treatment considerations in the setting of hemoptysis. He has a history of bronchiectasis with chronic Pseudomonas colonization and chronic hypoxemic respiratory failure requiring supplemental oxygen initially with exertion and sleep but now most of the time. Without O2 sats are 90% at rest. 94% on home O2. Recent admission to [facility] for worsening dyspnea despite oral antibiotics.  Subsequently developed hemoptysis which has stopped around mid-December. Was having significant hemoptysis during hospitalization. He is concerned about further bleeding and wishes to pursue treatment.    Review of Systems: Pertinent positives and negatives are noted in the HPI above. Review of systems otherwise negative. No other complaints.    Past Medical/Surgical History:  Past Medical History:   Diagnosis Date   ??? Abscess of lung (CMS-HCC) 11/06/2010    CT Chest 10/22/10 12/05/2010 right thoracotomy resection of right middle lobe and resection of right lower lobe abscess    ??? Arthritis     back   ??? Biceps tendon tear 2013    left side   ??? Bronchiectasis (CMS-HCC)     chronic psuedomonas infection   ??? Degenerative joint disease of left acromioclavicular joint    ??? GERD (gastroesophageal reflux disease)    ??? Hypertension    ??? Obstructive sleep apnea on CPAP 03/14/13    AHI 33.5, on BiPAP 12/8 based on sleep study 09/2013   ??? Pneumonia 2012    Lung abcess    ??? PONV (postoperative nausea and vomiting)    ??? Prostate cancer (CMS-HCC) 09/09/2017   ??? Rotator cuff injury left   ??? Vertigo Past Surgical History:   Procedure Laterality Date   ??? APPENDECTOMY  1969   ??? BACK SURGERY     ??? BRONCHOSCOPY  10/19/2010    [facility]   ??? CARPAL TUNNEL RELEASE  1999   ??? LUNG REMOVAL, PARTIAL Right 10/2010    RML and RLL partially resected   ??? MENISCECTOMY  2011   ??? NECK SURGERY  1993, 1999, 2002   ??? PR GERD TST W/ MUCOS IMPEDE ELECTROD,>1HR N/A 07/06/2012    Procedure: ESOPHAGEAL FUNCTION TEST, GASTROESOPHAGEAL REFLUX TEST W/ NASAL CATHETER INTRALUMINAL IMPEDANCE ELECTRODE(S) PLACEMENT, RECORDING, ANALYSIS AND INTERPRETATION; PROLONGED;  Surgeon: None None;  [facility];  Service: Gastroenterology   ??? PR OPEN TREAT RIB FRACTURE W/INT FIXATION, UNILATERAL, 1-2 RIBS Right 03/19/2015    Procedure: OPEN TREATMENT OF RIB FRACTURE REQUIRING INTERNAL FIXATION, UNILATERAL; 1-2 RIBS;  Surgeon: Evert Kohl, MD;  Location: MAIN OR [facility];  Service: Cardiothoracic   ??? PR THORACOTOMY W/THERAP WEDGE RESEXN ADDL IPSILATRL Right 08/07/2013    Procedure: THORACOTOMY; WITH THERAPEUTIC LOBECTOMY OF RIGHT MIDDLE AND RIGHT LOWER LOBE RESECTION , EACH ADDITIONAL RESECTION, IPSILATERAL;  Surgeon: Alvester Chou, MD;  Location: MAIN OR [facility];  Service: Cardiothoracic   ??? TONSILLECTOMY  1965       Family History:  Patient family history includes Alzheimer's disease in his mother;  Asthma in his son; Bronchiectasis  in his brother, brother, and daughter; Diabetes in his father; Heart failure in his father; Liver disease in his father; Stroke in his mother.    Social History:    Social History     Socioeconomic History   ??? Marital status: Married     Spouse name: Gavin Pound   ??? Number of children: 2   Occupational History     Employer: NOT EMPLOYED   Tobacco Use   ??? Smoking status: Never   ??? Smokeless tobacco: Never   Substance and Sexual Activity   ??? Alcohol use: No     Alcohol/week: 0.0 standard drinks   ??? 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     Social Determinants of Health     Financial Resource Strain: Low Risk    ??? Difficulty of Paying Living Expenses: Not very hard   Food Insecurity: No Food Insecurity   ??? Worried About Running Out of Food in the Last Year: Never true   ??? Ran Out of Food in the Last Year: Never true   Transportation Needs: No Transportation Needs   ??? Lack of Transportation (Medical): No   ??? Lack of Transportation (Non-Medical): No       Allergies:  Allergies   Allergen Reactions   ??? Gentamicin Other (See Comments)     BALANCE ISSUES   ??? Tobramycin Other (See Comments)     ototoxicity   ??? Morphine Nausea And Vomiting       Medications:    Current Outpatient Medications:   ???  albuterol 2.5 mg /3 mL (0.083 %) nebulizer solution, INHALE 3 ML (2.5 MG TOTAL) BY NEBULIZATION TWO (2) TIMES A DAY., Disp: 525 mL, Rfl: 3  ???  albuterol HFA 90 mcg/actuation inhaler, Inhale 2 puffs Four (4) times a day. (Patient taking differently: Inhale 2 puffs four (4) times a day as needed.), Disp: 25.5 g, Rfl: 3  ???  alcohol swabs (ALCOHOL PREP PADS) PadM, Use as directed with inhaled antibiotics, Disp: 100 each, Rfl: 5  ???  alendronate (FOSAMAX) 70 MG tablet, TAKE 1 TABLET BY MOUTH EVERY 7 DAYS FOR 12 DOSES, Disp: , Rfl:   ???  ascorbic acid, vitamin C, (VITAMIN C) 1000 MG tablet, Take 1,000 mg by mouth daily., Disp: , Rfl:   ???  atorvastatin (LIPITOR) 10 MG tablet, Take 10 mg by mouth daily., Disp: , Rfl:   ???  azithromycin (ZITHROMAX) 250 MG tablet, TAKE 1 TABLET BY MOUTH EVERY DAY, Disp: 90 tablet, Rfl: 3  ???  budesonide-formoteroL (SYMBICORT) 80-4.5 mcg/actuation inhaler, Inhale 2 puffs Two (2) times a day., Disp: 10.2 g, Rfl: 11  ???  calcium carbonate (CALCIUM 600 ORAL), Take 2 tablets by mouth two (2) times a day. 1 in morning and 1 in the evening, Disp: , Rfl:   ???  cholecalciferol, vitamin D3-50 mcg, 2,000 unit,, 50 mcg (2,000 unit) cap, Take 2,000 Units by mouth daily., Disp: , Rfl:   ??? coenzyme Q10 300 mg cap, Take 10-300 mg by mouth daily., Disp: , Rfl:   ???  colistimethate (COLYMYCIN) 150 mg injection, Inject 2 mL sterile water for injection to mix colistin vial, then draw up 2 mL (150mg ) and inhale 2 times a day, 28 days on and 28 days off., Disp: 60 each, Rfl: 5  ???  dornase alfa (PULMOZYME) 1  mg/mL nebulizer solution, Inhale 1 ampule (2.5 mg) daily. Use at least 30-60 minutes before airway clearance, or after airway clearance, Disp: 225 mL, Rfl: 3  ???  empty container Misc, USE AS DIRECTED, Disp: 1 each, Rfl: 2  ???  ferrous sulfate 325 (65 FE) MG tablet, Take 325 mg by mouth Two (2) times a day. BID, Disp: , Rfl:   ???  HYDROcodone-acetaminophen (NORCO) 7.5-325 mg per tablet, Take 1 tablet by mouth three (3) times a day (at 6am, noon and 6pm)., Disp: , Rfl:   ???  montelukast (SINGULAIR) 10 mg tablet, TAKE 1 TABLET BY MOUTH EVERY DAY AT NIGHT, Disp: 90 tablet, Rfl: 3  ???  multivitamin-minerals-lutein Tab, Take 1 tablet by mouth daily. , Disp: , Rfl:   ???  nebulizers (LC PLUS) Misc, Use as directed with tobramycin, Disp: 1 each, Rfl: 5  ???  nebulizers (LC PLUS) Misc, use with Colistin, Disp: 1 each, Rfl: 5  ???  nebulizers (LC PLUS) Misc, Use as directed with hypertonic saline 10%, Disp: 3 each, Rfl: 3  ???  needle, disp, 21 G (BD REGULAR BEVEL NEEDLES) 21 gauge x 1 1/2 Ndle, Use as directed with inhaled Colistin, Disp: 100 each, Rfl: 3  ???  omeprazole (PRILOSEC) 20 MG capsule, Take 20 mg by mouth Two (2) times a day. , Disp: , Rfl:   ???  sodium chloride (BD POSIFLUSH NORMAL SALINE 0.9) 0.9 % injection, Inject 2mL of 0.9%NaCl into colistin vial & gently mix. After withdrawing colistin dose, add an additional 1mL of 0.9%NaCl to neb cup with the colistin dose., Disp: 180 mL, Rfl: 11  ???  sodium chloride 10 % Nebu, Inhale 5 mL by nebulization Two (2) times a day. Discard remaining amount and use a new vial for each dose., Disp: 900 mL, Rfl: 3  ???  sterile water Soln, Use 2mL to mix Colistin, then add additional 2mL to neb cup with 2mL of mixed colistin. Inhale twice daily 28 days on and 28 days off., Disp: 600 mL, Rfl: 5  ???  syringe with needle (BD LUER-LOK SYRINGE) 3 mL 20 gauge x 1 1/2 Syrg, For use with inhaled antibiotic (Colistin), Disp: 60 each, Rfl: 30  ???  syringe with needle (BD LUER-LOK SYRINGE) 3 mL 21 gauge x 1 Syrg, For use with inhaled antibiotic (Colistin), Disp: 60 each, Rfl: 5  ???  tiotropium bromide (SPIRIVA RESPIMAT) 2.5 mcg/actuation inhalation mist, Inhale 2 puffs daily., Disp: 12 g, Rfl: 3  ???  tobramycin, PF, ([name]) 300 mg/5 mL nebulizer solution, Inhale 5 mL (300 mg total) by nebulization every twelve (12) hours. 28 days on and 28 days off., Disp: 280 mL, Rfl: 5  ???  voriconazole (VFEND) 200 MG tablet, Take 2 tablets (400 mg total) by mouth Two (2) times a day., Disp: 120 tablet, Rfl: 3  ???  zinc gluconate 50 mg (7 mg elemental zinc) tablet, Take 50 mg by mouth., Disp: , Rfl:      Objective:     Physical Exam  Deferred (virtual visit)    Pertinent Laboratory Values:   Lab Results   Component Value Date    WBC 4.1 11/20/2020    HGB 11.8 (L) 11/20/2020    HCT 34.5 (L) 11/20/2020    PLT 134 (L) 11/20/2020       Lab Results   Component Value Date    NA 138 11/20/2020    K 3.4 11/20/2020    CL 106 11/20/2020    CO2 25.0  11/20/2020    BUN 18 11/20/2020    CREATININE 1.2 04/15/2021    GLU 106 11/20/2020    CALCIUM 8.3 (L) 11/20/2020    MG 1.9 11/20/2020    PHOS 3.7 01/22/2016       Lab Results   Component Value Date    BILITOT 0.5 11/17/2020    BILIDIR 0.40 04/02/2015    PROT 7.8 11/17/2020    ALBUMIN 4.2 11/17/2020    ALT 19 11/17/2020    AST 19 11/17/2020    ALKPHOS 105 11/17/2020    GGT 64 06/21/2013       Lab Results   Component Value Date    LABPROT 10.4 08/03/2013    INR 0.95 04/14/2017    APTT 32.6 07/25/2014     Imaging Studies: CTA chest 04/15/2021 images personally reviewed and interpreted. Marked post-surgical volume loss and in the right lung with bronchiectatic changes. Compensatory hyperexpansion of the left lung. Dilated and tortuous right bronchial artery arises from the the proximal descending thoracic aorta from what appears to be an interchostobronchial trunk.        Assessment:   Jordan [name] is a 66 y.o. male who presents with bronchiectasis c/b hemoptysis.    Plan (Medical Decision Making):   I discussed the various treatment options for hemoptysis including embolization and surgery. Given the nature of the patient's disease, we feel that the he would be best treated with bronchial artery embolization at this time. The risks, benefits and alternatives were fully discussed including bleeding, infection, arterial injury (dissection/pseudoaneurysm), damage to adjacent structures/organs, non-target embolization including shunting (bronhial artery-to-PA and BA-to-PV) and spinal artery embolization.       All of the patient's questions answered to his satisfaction. After the review of risks and benefits outlined above he voiced a desire to proceed. The procedure will be scheduled at the earliest mutually agreeable and available date.        The patient reports they are currently: at home. I spent 35 minutes on the phone with the patient on the date of service. I spent an additional 15 minutes on pre- and post-visit activities on the date of service.     The patient was physically located in [place] or a state in which I am permitted to provide care. The patient and/or parent/guardian understood that s/he may incur co-pays and cost sharing, and agreed to the telemedicine visit. The visit was reasonable and appropriate under the circumstances given the patient's presentation at the time.    The patient and/or parent/guardian has been advised of the potential risks and limitations of this mode of treatment (including, but not limited to, the absence of in-person examination) and has agreed to be treated using telemedicine. The patient's/patient's family's questions regarding telemedicine have been answered.     If the visit was completed in an ambulatory setting, the patient and/or parent/guardian has also been advised to contact their provider???s office for worsening conditions, and seek emergency medical treatment and/or call 911 if the patient deems either necessary.        Synopsis:  --Bronchiectasis with hemoptysis (massive during recent admission)  --Plan for bronchial artery embolization with possible moderate sedation (possibly just Versed)  --Informed consent to be obtained in PRU on day of procedure    Georgian Co, MD, PhD  Assistant Professor of Radiology  [facility] [place]

## 2021-06-05 ENCOUNTER — Institutional Professional Consult (permissible substitution)
Admit: 2021-06-05 | Discharge: 2021-06-06 | Payer: PRIVATE HEALTH INSURANCE | Attending: Diagnostic Radiology | Primary: Diagnostic Radiology

## 2021-06-05 DIAGNOSIS — J479 Bronchiectasis, uncomplicated: Secondary | ICD-10-CM | POA: Diagnosis not present

## 2021-06-05 DIAGNOSIS — R042 Hemoptysis: Secondary | ICD-10-CM | POA: Diagnosis not present

## 2021-06-05 DIAGNOSIS — Z79899 Other long term (current) drug therapy: Principal | ICD-10-CM

## 2021-06-05 LAB — VORICONAZOLE, SERUM: Voriconazole, Serum: 1.8 ug/mL

## 2021-06-05 NOTE — Unmapped (Signed)
Fax sent for EKG.  Confirmation of successful transmittal received.    Updated pt via mychart message with regards to plan. Asked him to get EKG done within the next week or 2.

## 2021-06-06 DIAGNOSIS — J479 Bronchiectasis, uncomplicated: Principal | ICD-10-CM

## 2021-06-06 DIAGNOSIS — A498 Other bacterial infections of unspecified site: Principal | ICD-10-CM

## 2021-06-06 MED ORDER — COLISTIN (COLISTIMETHATE SODIUM) 150 MG SOLUTION FOR INJECTION
5 refills | 0 days | Status: CP
Start: 2021-06-06 — End: ?
  Filled 2021-07-14: qty 60, 30d supply, fill #0

## 2021-06-06 NOTE — Unmapped (Signed)
[facility] Therapeutic Monitoring Pharmacy Note    Aksh Swart is a 66 y.o. male continuing voriconazole. Last CMP completed 05/30/2021. See Care Everywhere. Last Voriconazole level competed 05/30/2021. See Media tab     Indication: Treatment of aspergilloma    Prior Dosing Information: Current regimen 400mg  PO BID - dose increase as of 05/12/21    Goals:  Therapeutic Drug Levels  Trough level: 1-6mcg/mL  ?? >1 mcg/ml: okay for most indications  ?? >2 mcg/ml: consider for invasive mold infections (including aspergillosis), CNS  infections, elevated MICs, or disseminated infections     Additional Clinical Monitoring/Outcomes  Monitor QTc, renal function (SCr and UOP), and liver function (LFTs)    Results:  Voriconazole level: 1.8 micrograms/mL 05/30/21    Longitudinal Dose Monitoring:  Date Dose Level  (mcg/mL) Key Drug Interactions   04/11/21 200mg  PO BID Not drawn  N/A (Symbicort dose reduced)   04/18/21 200mg  PO BID Not drawn N/A (Symbicort dose reduced)   04/25/21 200mg  PO BID 0.6 N/A (Symbicort dose reduced)   05/08/21 300mg  PO BID 0.9 N/A (Symbicort dose reduced)   05/20/21 400mg  PO BID 1.9 N/A (Symbicort dose reduced)   05/30/21 400mg  PO BID 1.8 N/A (Symbicort dose reduced)     Wt Readings from Last 3 Encounters:   03/18/21 80.1 kg (176 lb 9.4 oz)   03/18/21 83 kg (183 lb)   11/18/20 80.1 kg (176 lb 9.4 oz)     Lab Results   Component Value Date    BILITOT 0.3 05/30/2021    ALBUMIN 3.7 05/30/2021    ALT 15 05/30/2021    AST 19 05/30/2021       Lab Results   Component Value Date    CREATININE 1.19 05/20/2021    CREATININE 1.05 05/30/2021       Assessment/Plan:  Recommendation(s)  ??? Baseline QTC on 8/28/222  ??? CMP collected on 05/30/21: LFTs WNL. Creatinine improved.  ??? No new medications identified in fill history or on medication list  ??? Voriconazole level very slightly SUBtherapeutic. Discussed current level with provider and will Continue voriconazole to 400 mg (5 mg/kg twice daily) PO BID. Would like EKG now that pt has reached target dose, EKG order faxed by nurse coordinator 3/16 and notified patient to get in 1-2 weeks.    Follow-up   ??? Next level will take place in 1 month    Electronically signed by:  Prince Solian, PharmD, CPP  Clinical Pharmacist Practitioner  [facility] Adult Cystic Fibrosis/Pulmonary Clinic  [phone]

## 2021-06-06 NOTE — Unmapped (Signed)
Colymycin refill request  Last Visit Date: 03/18/2021  Next Visit Date: 07/01/2021

## 2021-06-09 NOTE — Unmapped (Signed)
[facility] Refill Coordination Note    Specialty Medication(s) to be Shipped:   CF/Pulmonary: -Generic inhaled tobramycin 300mg /70mL inhalation solution    Other medication(s) to be shipped: No additional medications requested for fill at this time     Jordan [name], DOB: 1956-02-21  Phone: [phone] (home)       All above HIPAA information was verified with patient.     Was a Nurse, learning disability used for this call? No    Completed refill call assessment today to schedule patient's medication shipment from the [facility] [phone]).  All relevant notes have been reviewed.     Specialty medication(s) and dose(s) confirmed: Regimen is correct and unchanged.   Changes to medications: Jordan [name] reports no changes at this time.  Changes to insurance: No  New side effects reported not previously addressed with a pharmacist or physician: None reported  Questions for the pharmacist: No    Confirmed patient received a Conservation officer, historic buildings and a Surveyor, mining with first shipment. The patient will receive a drug information handout for each medication shipped and additional FDA Medication Guides as required.       DISEASE/MEDICATION-SPECIFIC INFORMATION        For CF patients: CF [employer] Active? No-not enrolled    SPECIALTY MEDICATION ADHERENCE     Medication Adherence    Patient reported X missed doses in the last month: 0  Specialty Medication: Colistimethate 150mg  Inj  Patient is on additional specialty medications: Yes  Additional Specialty Medications: Tobramycin 300mg /69ml Neb Solu  Patient Reported Additional Medication X Missed Doses in the Last Month: 0  Patient is on more than two specialty medications: No  Any gaps in refill history greater than 2 weeks in the last 3 months: no  Demonstrates understanding of importance of adherence: yes  Informant: patient  Reliability of informant: reliable  Reasons for non-adherence: no problems identified  Support network for adherence: family member  Confirmed plan for next specialty medication refill: delivery by pharmacy  Refills needed for supportive medications: not needed              Were doses missed due to medication being on hold? No    tobramycin (PF) 300 mg/5 mL nebulizer solution : 0 days of medicine on hand       REFERRAL TO PHARMACIST     Referral to the pharmacist: Not needed      SHIPPING     Shipping address confirmed in Epic.     Delivery Scheduled: Yes, Expected medication delivery date: 06/19/21.     Medication will be delivered via UPS to the prescription address in Epic WAM.    Jordan [name]   [facility] Specialty Technician

## 2021-06-09 NOTE — Unmapped (Signed)
Fax sent for EKG.  Confirmation of successful transmittal received.

## 2021-06-11 ENCOUNTER — Ambulatory Visit: Payer: PPO

## 2021-06-12 ENCOUNTER — Other Ambulatory Visit: Payer: Self-pay

## 2021-06-12 ENCOUNTER — Ambulatory Visit: Payer: PPO | Admitting: Cardiology

## 2021-06-12 DIAGNOSIS — I471 Supraventricular tachycardia: Secondary | ICD-10-CM

## 2021-06-12 DIAGNOSIS — Z5181 Encounter for therapeutic drug level monitoring: Secondary | ICD-10-CM

## 2021-06-12 NOTE — Progress Notes (Signed)
ICD-10-CM   ?1. Encounter for therapeutic drug monitoring  Z51.81 EKG 12-Lead  ?  ?2. PSVT (paroxysmal supraventricular tachycardia) (HCC)  I47.1 EKG 12-Lead  ?  ? ? ?EKG 06/12/2021: Normal sinus rhythm with rate of 92 bpm, left atrial enlargement, left axis deviation, left anterior fascicular block.  Incomplete right bundle branch block.  No evidence of ischemia.  Normal QT interval. ?

## 2021-06-18 MED FILL — TOBRAMYCIN 300 MG/5 ML IN 0.225 % SODIUM CHLORIDE FOR NEBULIZATION: RESPIRATORY_TRACT | 56 days supply | Qty: 280 | Fill #3

## 2021-06-18 MED FILL — SPIRIVA RESPIMAT 2.5 MCG/ACTUATION SOLUTION FOR INHALATION: RESPIRATORY_TRACT | 90 days supply | Qty: 12 | Fill #0

## 2021-06-19 DIAGNOSIS — G8929 Other chronic pain: Secondary | ICD-10-CM | POA: Diagnosis not present

## 2021-06-19 DIAGNOSIS — M810 Age-related osteoporosis without current pathological fracture: Secondary | ICD-10-CM | POA: Diagnosis not present

## 2021-06-19 DIAGNOSIS — I10 Essential (primary) hypertension: Secondary | ICD-10-CM | POA: Diagnosis not present

## 2021-06-19 DIAGNOSIS — E785 Hyperlipidemia, unspecified: Secondary | ICD-10-CM | POA: Diagnosis not present

## 2021-06-25 ENCOUNTER — Other Ambulatory Visit (HOSPITAL_COMMUNITY)
Admission: RE | Admit: 2021-06-25 | Discharge: 2021-06-25 | Disposition: A | Discharge status: Home / Self Care | Payer: PPO | Attending: *Deleted | Admitting: *Deleted

## 2021-06-25 DIAGNOSIS — B449 Aspergillosis, unspecified: Secondary | ICD-10-CM | POA: Diagnosis not present

## 2021-06-25 DIAGNOSIS — Z79899 Other long term (current) drug therapy: Secondary | ICD-10-CM | POA: Diagnosis not present

## 2021-06-25 LAB — COMPREHENSIVE METABOLIC PANEL
ALT: 14 U/L (ref 0–44)
AST: 16 U/L (ref 15–41)
Albumin: 3.5 g/dL (ref 3.5–5.0)
Alkaline Phosphatase: 75 U/L (ref 38–126)
Anion gap: 7 (ref 5–15)
BUN: 21 mg/dL (ref 8–23)
CO2: 26 mmol/L (ref 22–32)
Calcium: 8.9 mg/dL (ref 8.9–10.3)
Chloride: 107 mmol/L (ref 98–111)
Creatinine, Ser: 1.11 mg/dL (ref 0.61–1.24)
GFR, Estimated: >60 mL/min (ref >60)
Glucose, Bld: 92 mg/dL (ref 70–99)
Potassium: 4.5 mmol/L (ref 3.5–5.1)
Sodium: 140 mmol/L (ref 135–145)
Total Bilirubin: 0.4 mg/dL (ref 0.3–1.2)
Total Protein: 7.1 g/dL (ref 6.5–8.1)

## 2021-06-26 LAB — VORICONAZOLE, SERUM: Voriconazole, Serum: 2.2 ug/mL

## 2021-06-30 NOTE — Unmapped (Signed)
[place] [facility]   Assessment:      Patient:Jordan [name] (24-Aug-1955)    [name] is a 66 y.o. male who is seen for follow up of bronchiectasis with chronic Pseudomonas colonization and chronic hypoxemic respiratory failure requiring supplemental oxygen with exertion and sleep. Recent admission to [facility] for worsening dyspnea despite oral antibiotics.  Then developed hemoptysis.  CT consistent with aspergilloma in RUL and Aspergillus Ag was elevated.  Also found to have tortuous bronchial artery supplying the RUL; scheduled for embolization.     Plan:      Problem List Items Addressed This Visit          Respiratory    Bronchiectasis (CMS-HCC) - Primary    Relevant Orders    AFB culture    Lower Respiratory Culture    Chronic respiratory failure with hypoxia (CMS-HCC)    Pulmonary aspergilloma (CMS-HCC)       Other    Hemoptysis    Pseudomonas aeruginosa infection    Relevant Orders    AFB culture    Lower Respiratory Culture   Therapies:  Patient met with the respiratory therapist today to discuss home oxygen equipment and the Life2000.  No changes were made to airway clearance regimen nor inhaled medications.   He continues to need and benefit from continuous home oxygen.  Pursuing Life2000 to help with work of breathing with activity to allow him to increase his activity.  After discussion with CPP, starting mirtazapine to help with appetite, mood, and sleep.    Testing:  Patient was unable to expectorate sputum today to be sent for bacterial and AFB cultures.  If he has future exacerbations, sputum should be sent for bacterial and AFB cultures. The bacterial culture should be processed like a cystic fibrosis sample given the overlapping pathogens. There are standing orders for these cultures in our system.  Due for repeat EKG and voriconazole labs in 1 month    Referrals:  I will reach out to thoracic surgery to plan what we can do if voriconazole and embolization fail.    Barbara will schedule an appointment with his local ENT to see if he has anything going on that would explain his dizziness/feeling off balance.  He saw bronchiectasis/[facility] dietician today.    Vaccinations:  COVID-19: UTD  Flu: UTD  Pneumococcal: UTD  Tdap: UTD    Jordan [name] will return to clinic in 3 months for follow-up with pre-bronchodilator spirometry and sputum cultures.  He will call or send me a message via MyChart if questions or concerns arise before this visit. Jordan [name] is in agreement with the above plan.     Subjective:      HPI: [name] is a 65 y.o. male who is seen for follow up of bronchiectasis with PsA colonization and prior history of multiple lung resections.    09/27/18:  I last saw him on 05/17/18. He reached out at the end of May reporting symptoms c/w an exacerbation. I treated him with a two week course of Ciprofloxacin, which seemed to help.  Reports that he feels winded too much - if walks up incline or up a couple hundred feet. This has worsened over the past year. Has to stop and let lungs catch up.  Of note, he has been off testosterone supplementation for the past year after he was diagnosed with prostate cancer.    He received the [facility], which he is using with nebulized HTS 7% and Pulmozyme. After  using the Vest for 6-8 weeks, he noticed improvement with more phlegm mobilization. Sometimes has to pause it to cough to get stuff up.  Plugging feeling happening a lot more, especially with the vest.  Most of the time, sputum is thinned out with Pulmozyme but still gets those plugs.    Had to stop eating ice cream or drinking milkshake - will cough will eating and that makes him vomit on occasion. Past few weeks, when eating will cough, no matter what he eats.  Happens almost once a day. Doesn't feel like things get stuck typically.      02/07/19:  Following his last visit, Jordan [name] reached out regarding portable oxygen.  We had considered the life 2000 but he does not qualify because he does not use supplemental oxygen.  He performed the 6-minute walk test prior to today's visit but did not desaturate.  He wonders if this is because the test was done in the morning and he doesn't have breathing issues in the morning.  Typically, breathing issues occur in afternoon.  During these episodes, he will have to use his inhaler and feels like he is suffocating.  He describes it as an uncomfortable feeling that limits his ability to do things with family.  If he is having trouble catching breath, oximetry is 92-93%. Has seen it in the 80s if comes in from outside and is short of breath.    For airway clearance, he is nebulizing 10% hypertonic saline and using his percussive vest for 20 minute sessions in AM and PM. Has to pause it to cough and get stuff up.  Coughing up more stuff since last visit and is waking up 1-2 times a night with cough.having coughing fits between clearance session.  What he expectorates is less of a mucous plug and mostly stringy sputum that is yellowish to light brown in color.  No hemoptysis.  On Saturday, he had a coughing fit that nearly triggered posttussive emesis.  He only expectorated about 3-4 globs of phlegm.  Nebulizing Pulmozyme daily.  Alternates [name] and colistin every other month.  On [name] this month and will start Colistin on Dec 1.    06/06/19:  Coughing more and more sputum.  Waking up 1-2 times per night coughing. Can cough once or just keep coughing.  Sputum is yellow in color without evidence of blood.  He is not expectorating green sputum currently. On colistin this month; alternates with [name]. Went through January off all inhaled antibiotics.  Oxygen levels in the low 90s.  Uses rescue inhaler if goes up stairs.  Regularly performing airway clearance with nebulized 10% hypertonic saline twice a day, percussive vest twice a day, and Pulmozyme daily. He last received IV antibiotics in January 2019 but has been treated with oral antibiotics on 04/19/18 and 08/17/18, 03/10/19. 11/10/19:  1-2 times per week can get sharp right sided chest pain - even with nipple line. Can happen with clearance or activity or rest. Never during sleep. Following his hospitalization, he first experienced it in June. Lasts only about 10 seconds. Not reproducible. Limited activity due to dyspnea. Regularly performing airway clearance with nebulized 10% hypertonic saline twice a day, percussive vest twice a day, and Pulmozyme daily. Remains on chronic azithromycin therapy.    02/13/20:  Showed up to [facility] with oxygen saturations of 87-88% after walking from parking lot (<100 yd) and up incline without supplemental oxygen.  Using 2L continuous flow with exertion and sleep.  Had sleep  study done at [facility] on RA and was told that he should be fine with just the supplemental oxygen. I do not have this report. Using oxygen when goes to football game; needs long period to recover from activity.  Going up 20 foot incline with oxygen, will have to stop and catch breath.     Wheezing more lately and endorses chest tightness. Has need to use albuterol as rescue 3-4 times per day.  Used albuterol in the car on the way here. Typically would be able to cough up large amount of mucus after PFTs but only coughed up small amount. Feels like mucus is stuck in lungs despite twice daily airway clearance with 10% HTS and Vest. Also using Pulmozyme once a day and Symbicort twice a day after clearance.  Using colistin this month. He ordered the [name] nebs to use next month even though insurance won't cover it. Feels like inhaled antibiotics definitely help.    05/10/20:   Saw [facility] for lung transplant evaluation.  Felt to be poor candidate due to prior surgeries and primary disease.  Candidacy closed at [facility]. Referral sent to [facility] and [place] on 2/15.  Based on labs and testing done there, getting Hep B vaccination series (not Hep A or Hep B immune).  Also getting DEXA that was ordered by Dr. Abigail Miyamoto.    Had PSG done at [facility] - no evidence of OSA but does have increased upper airway resistance.  He never sleeps on his back; only sleeps on side or stomach.   Fatigue is most burdensome symptom.    Regrets that he let the surgeon operate on his lung before he met me.  He completed pulmonary rehab at [facility] but they only had him on machine for ~30 minutes and then stretching.  No strength exercises. He is planning on joining a gym based on recommendations from [facility] transplant.  Was surprised that he didn't desaturate during his walk test at [facility].    07/02/20:  At last visit, referred him to [facility] for pulmonary rehab. Found to have osteoporosis in hips. Turned down for transplant at [facility] based on chart review.  No change in breathing or cough.  Every once in a while, has bad coughing spells.  Same sputum volume and color. Globby to stringy. Not dark brown or bloody.  Sputum is light brown to yellowish.  Breathing about the same.  Started on 1200 mg calcium for osteoporosis.  Starting telehealth pulmonary rehab - feels optimistic that it will help him do more.    10/31/20:  Heart monitor for pulm rehab - had 40 events of fast rate or afib.  Has felt heart fluttering.  Did event monitor for 8 days.  Getting echocardiogram on Wednesday and then seeing cardiologist the next week.     Was having sharp pains over right abdomen below ribs.  Once a month, now once every 2 weeks.    03/18/21:  Since his last clinic visit, he was evaluated by cardiology at [facility] (October) and then was admitted to [facility] earlier this month due to hemoptysis that occurred while on ciprofloxacin for an exacerbation.  He also had worsening shortness of breath.  Was discharged home on 21 days of meropenem.  Did not undergo embolization or bronchoscopy.     Hasn't felt good since August.  No energy, lost weight. End Nov, asked for Cipro and for few days felt better. Then didn't feel better and was ordeal  to see PCP.  WBC elevated in ER - they called me. Friday night/Saturday started seeing dark blood in sputum.  Monday night 8PM, got breathing treatment with bright red globs.  Large volume blood and phlegm. They were going to do bronchoscopy but then bleeding improved with change in abx, holding 10% saline. Was continued on percussive vest and Pulmozyme. Friday to Saturday, wine colored.  Then pinkish. Sunday night could barely see blood and then gone Monday.    Finished Meropenem last night (3 weeks total). Christmas eve morning, started seeing bright red streaks. Yesterday, coughed up bright red blood that was 50/50 with phlegm.  Today more wine colored.   Shortness of breath better with meropenem but no change in hemoptysis.    07/01/21:  After last visit, we attempted to perform bronchoscopy but due to hypoxemia, we canceled the procedure.  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  He also had a visit with Dr. Erven Colla in interventional radiology and is scheduled for bronchial artery embolization later this month.    Feels off balance but not dizzy/spinning.  This has been intensifying with increasing the voriconazole dose. Happening daily every time he stands up. No hearing changes. Head feels heavy.  No vision changes nor double visions. Sinuses up and down, bleed a little bit.  Some post-nasal drip.     Respiratory Symptoms:   Cough: productive of light brown to yellowish sputum unchanged volume.  Consistency varies time to time but overall the same. Several coughing spells that caused him to throw up over past month.  Not feeling congested in chest.  Nocturnal awakenings: increased over past month.    Wheezing: absent.  Chest tightness: absent.  Rescue albuterol use: Not in months.  Pleurisy: absent.  Hemoptysis: as above.  MMRC Stage: 3 = I stop for breath after walking about 100 yards or after a few minutes on level ground. Walks to bathroom on room air and sats drop to mid 80s but recover quickly.  Dyspnea unrelieved despite wearing oxygen; limiting his activity.     Exacerbations:   Number of exacerbations treated in the past year: 2  Dates of exacerbations: 03/2018, 07/2018, 02/2019, 09/2019, 12/2019, 10/2020, 01/2021  Number of hospitalizations for exacerbations in the past 2 years: 3  Dates of hospitalizations: 01/2015, 03/2015, 01/2016, 03/2017, 09/2019, 10/2020, 02/2021    Pulmonary Therapies:   Airway clearance: Pulmozyme daily, HTS 10% bid, percussive vest bid.   Inhaled antibiotics: Alternates [name] and Colistin.- on [name] currently.  Inhalers: Symbicort 160/4.5, Spiriva Respimat  Chronic antibiotics: azithromycin 250 mg daily, voriconazole 400 mg (started on 04/04/21)  Exercise: Was walking 3 miles at 2.1 without stopping on the treadmill.  Pulmonary Rehab: Telehealth pulmonary rehab July 2022  Supplemental oxygen: 2L continuous and with sleep. Sometimes bumps it up due to dyspnea, not desaturations.  NIPPV: No    ROS: Lost appetite and it hasn't come back. Having trouble sleeping and mood is down. No F/C/NS.     Past Medical History:   Diagnosis Date    Abscess of lung (CMS-HCC) 11/06/2010    CT Chest 10/22/10 12/05/2010 right thoracotomy resection of right middle lobe and resection of right lower lobe abscess     Arthritis     back    Biceps tendon tear 2013    left side    Bronchiectasis (CMS-HCC)     chronic psuedomonas infection    Degenerative  joint disease of left acromioclavicular joint     GERD (gastroesophageal reflux disease)     Hypertension     Obstructive sleep apnea on CPAP 03/14/13    AHI 33.5, on BiPAP 12/8 based on sleep study 09/2013    Pneumonia 2012    Lung abcess     PONV (postoperative nausea and vomiting)     Prostate cancer (CMS-HCC) 09/09/2017    Rotator cuff injury left    Vertigo        Past Surgical History:   Procedure Laterality Date    APPENDECTOMY  1969    BACK SURGERY      BRONCHOSCOPY  10/19/2010    [facility] CARPAL TUNNEL RELEASE  1999    LUNG REMOVAL, PARTIAL Right 10/2010    RML and RLL partially resected    MENISCECTOMY  2011    NECK SURGERY  1993, 1999, 2002    PR GERD TST W/ MUCOS IMPEDE ELECTROD,>1HR N/A 07/06/2012    Procedure: ESOPHAGEAL FUNCTION TEST, GASTROESOPHAGEAL REFLUX TEST W/ NASAL CATHETER INTRALUMINAL IMPEDANCE ELECTRODE(S) PLACEMENT, RECORDING, ANALYSIS AND INTERPRETATION; PROLONGED;  Surgeon: None None;  [facility]: [facility];  Service: Gastroenterology    PR OPEN TREAT RIB FRACTURE W/INT FIXATION, UNILATERAL, 1-2 RIBS Right 03/19/2015    Procedure: OPEN TREATMENT OF RIB FRACTURE REQUIRING INTERNAL FIXATION, UNILATERAL; 1-2 RIBS;  Surgeon: Evert Kohl, MD;  Location: MAIN OR [place];  Service: Cardiothoracic    PR THORACOTOMY W/THERAP WEDGE RESEXN ADDL IPSILATRL Right 08/07/2013    Procedure: THORACOTOMY; WITH THERAPEUTIC LOBECTOMY OF RIGHT MIDDLE AND RIGHT LOWER LOBE RESECTION , EACH ADDITIONAL RESECTION, IPSILATERAL;  Surgeon: Alvester Chou, MD;  Location: MAIN OR [place];  Service: Cardiothoracic    TONSILLECTOMY  1965       Family History   Problem Relation Age of Onset    Alzheimer's disease Mother     Stroke Mother     Bronchiectasis  Brother         presumed    Bronchiectasis  Brother         presumed    Liver disease Father     Diabetes Father     Heart failure Father     Bronchiectasis  Daughter     Asthma Son         childhood asthma    Clotting disorder Neg Hx     Anesthesia problems Neg Hx        Social History     Tobacco Use    Smoking status: Never    Smokeless tobacco: Never   Substance Use Topics    Alcohol use: No     Alcohol/week: 0.0 standard drinks    Drug use: No       Allergies  Reviewed on 07/01/2021        Reactions Comments    Gentamicin Other (See Comments) BALANCE ISSUES    Tobramycin Other (See Comments) ototoxicity    Morphine Nausea And Vomiting           Current Outpatient Medications   Medication Sig Dispense Refill    albuterol 2.5 mg /3 mL (0.083 %) nebulizer solution INHALE 3 ML (2.5 MG TOTAL) BY NEBULIZATION TWO (2) TIMES A DAY. 525 mL 3    atorvastatin (LIPITOR) 10 MG tablet Take 1 tablet (10 mg total) by mouth daily.      azithromycin (ZITHROMAX) 250 MG tablet TAKE 1 TABLET BY MOUTH EVERY DAY 90 tablet 3    budesonide-formoteroL (SYMBICORT) 80-4.5  mcg/actuation inhaler Inhale 2 puffs Two (2) times a day. 10.2 g 11    coenzyme Q10 300 mg cap Take 10-300 mg by mouth daily.      colistimethate (COLYMYCIN) 150 mg injection Inject 2 mL sterile water for injection to mix colistin vial, then draw up 2 mL (150mg ) and inhale 2 times a day, 28 days on and 28 days off. 60 each 5    HYDROcodone-acetaminophen (NORCO) 7.5-325 mg per tablet Take 1 tablet by mouth three (3) times a day (at 6am, noon and 6pm).      montelukast (SINGULAIR) 10 mg tablet TAKE 1 TABLET BY MOUTH EVERY DAY AT NIGHT 90 tablet 3    nebulizers (LC PLUS) Misc Use as directed with tobramycin 1 each 5    nebulizers (LC PLUS) Misc use with Colistin 1 each 5    nebulizers (LC PLUS) Misc Use as directed with hypertonic saline 10% 3 each 3    omeprazole (PRILOSEC) 20 MG capsule Take 1 capsule (20 mg total) by mouth Two (2) times a day.      sodium chloride (BD POSIFLUSH NORMAL SALINE 0.9) 0.9 % injection Inject 2mL of 0.9%NaCl into colistin vial & gently mix. After withdrawing colistin dose, add an additional 1mL of 0.9%NaCl to neb cup with the colistin dose. 180 mL 11    sodium chloride 10 % Nebu Inhale 5 mL by nebulization Two (2) times a day. Discard remaining amount and use a new vial for each dose. 900 mL 3    sterile water Soln Use 2mL to mix Colistin, then add additional 2mL to neb cup with 2mL of mixed colistin. Inhale twice daily 28 days on and 28 days off. 600 mL 5    tiotropium bromide (SPIRIVA RESPIMAT) 2.5 mcg/actuation inhalation mist Inhale 2 puffs daily. 12 g 3    tobramycin, PF, ([name]) 300 mg/5 mL nebulizer solution Inhale 5 mL (300 mg total) by nebulization every twelve (12) hours. 28 days on and 28 days off. 280 mL 5    voriconazole (VFEND) 200 MG tablet Take 2 tablets (400 mg total) by mouth Two (2) times a day. 120 tablet 3    albuterol HFA 90 mcg/actuation inhaler Inhale 2 puffs Four (4) times a day. (Patient taking differently: Inhale 2 puffs four (4) times a day as needed.) 25.5 g 3    alcohol swabs (ALCOHOL PREP PADS) PadM Use as directed with inhaled antibiotics 100 each 5    alendronate (FOSAMAX) 70 MG tablet TAKE 1 TABLET BY MOUTH EVERY 7 DAYS FOR 12 DOSES      ascorbic acid, vitamin C, (VITAMIN C) 1000 MG tablet Take 1,000 mg by mouth daily.      calcium carbonate (CALCIUM 600 ORAL) Take 2 tablets by mouth two (2) times a day. 1 in morning and 1 in the evening      cholecalciferol, vitamin D3-50 mcg, 2,000 unit,, 50 mcg (2,000 unit) cap Take 2,000 Units by mouth daily.      dornase alfa (PULMOZYME) 1 mg/mL nebulizer solution Inhale 1 ampule (2.5 mg) daily. Use at least 30-60 minutes before airway clearance, or after airway clearance 225 mL 3    empty container Misc USE AS DIRECTED 1 each 2    ferrous sulfate 325 (65 FE) MG tablet Take 325 mg by mouth Two (2) times a day. BID      multivitamin-minerals-lutein Tab Take 1 tablet by mouth daily.       needle, disp, 21 G (BD REGULAR  BEVEL NEEDLES) 21 gauge x 1 1/2 Ndle Use as directed with inhaled Colistin 100 each 3    syringe with needle (BD LUER-LOK SYRINGE) 3 mL 20 gauge x 1 1/2 Syrg For use with inhaled antibiotic (Colistin) 60 each 30    syringe with needle (BD LUER-LOK SYRINGE) 3 mL 21 gauge x 1 Syrg For use with inhaled antibiotic (Colistin) 60 each 5    zinc gluconate 50 mg (7 mg elemental zinc) tablet Take 50 mg by mouth.       No current facility-administered medications for this visit.     Physical Exam:   BP 119/69 (BP Site: L Arm, BP Position: Sitting, BP Cuff Size: Medium)  - Pulse 79  - Temp 36.8 ??C (98.2 ??F) (Temporal)  - Ht 174 cm (5' 8.5)  - Wt 81.2 kg (179 lb)  - SpO2 94% Comment: 2L - BMI 26.82 kg/m?? Fatigued appearing white male in no acute distress. Easy work of breathing without accessory muscle use. Speaking in full sentences.  Diminished air entry over RUL as compared to LUL posteriorly.  Crackles over entire left lung field.  Regular rate and rhythm with normal S1 and S2.  No murmurs, rubs, or gallops.  Clubbing unchanged from prior.    Diagnostic Review:   The following data were reviewed during this visit with key findings summarized below:  Pulmonary Function Testing:      Spirometry consistent with severe airway obstruction and suggestive of moderate restriction.  No significant change from prior.  Normal inspiratory loop.    FVC (% predicted) FEV1 (% predicted) FEV1/FVC   11/10/2013 3.19 L (68%) 2.16 L (61%)  68%   02/23/2014 3.50 L (76%)  2.23 L (64%)  64%   07/12/2014  2.94 L (66%)  2.02 L (58%)  69%   01/15/2015  2.82 L (62%)  1.79 L (52%)  63%   02/08/2015  3.04 L (66%)  2.06 L (59%)  68%   04/02/2015  2.86 L (63%)  1.83 L (53%)  64%   12/10/2015  2.77 L (62%)  1.62 L (48%)  58%   01/27/2016  2.42 L (54%)  1.55 L (46%)  64%   03/24/2016  3.36 L (75%)  1.99 L (59%)  59%   07/21/2016  2.56 L (57%)  1.54 L (46%)  60%   12/29/2016  2.80 L (63%)  1.57 L (47%)  56%   04/06/2017  2.61 L (59%)  1.46 L (44%)  56%   07/06/2017  2.56 L (58%)  1.49 L (45%)  58%   10/12/2017  2.63 L (59%)  1.47 L (44%)  56%   01/18/2018  2.68 L (63%)  1.36 L (42%)  51%   05/17/2018 2.49 L (57%)  1.25 L (38%)  50%   09/26/2018 2.43 L (57%)  1.26 L (39%)  52%   01/26/2019 2.16 L (51%)  1.16 L (36%)  54%   06/06/2019 2.47 L (59%) 1.11 L (34%) 45%   11/10/19 2.14 L (50%) 1.20 L (37%) 56%   02/13/20 2.01 L (48%) 1.08 L (34%) 54%   05/02/20 2.29 L (54%) 1.11 L (34%) 48%   07/02/20 (pre) 2.11 L (51%) 1.17 L (37%) 56%   07/02/20 (post) 2.26 L (54%) [+7.2%] 1.22 L (38%) [+4.0%] 54%   10/31/20 2.46 L (59%) 1.17 L (37%) 48%   07/01/21 2.33 L (56%) 1.13 L (36%) 48%     6-minute Walk (12/06/19): Heart Rate: Resting Heart Rate was 71  bpm. Maximum Heart Rate achieved 97 bpm. Patient did reach target heart rate (85-132 bpm).  Heart rate returned to 77 bpm after 2 minutes of recovery.  Oxygen Saturation: Resting O2 Saturation was 94% on RA. O2 saturation nadir was 87% at minute 2. O2 saturation improved to 96% with 2L via [place] at minute 3 and maintained saturations above 88% for remainder of walk. O2 saturation returned to 96% on room air after 2 minutes of recovery.  Distance: Total 6 minute walk distance was 320 m. Predicted 6 minute walk distance 548.9 m (LLN of 395.8 m). Percent of predicted 6 minute walk distance of 58%. Testing included one 5-minute pause. Distance obtained after placed on 2L and walked for an additional 6 minutes.  BORG: Resting BORG score was 1. Maximum BORG score was 5 at minute 2. Concluding BORG score was 0 after 2 minutes of recovery. Impression: Significant exercise induced oxygen desaturation (below 88%). Exertional hypoxemia improved with 2L supplemental oxygen. Walk distance is less than expected suggesting a cardio-respiratory defect -- clinical correlation suggested.  Recommendation: Supplemental oxygen required with exercise at 2L [place]. Consider further evaluation with spirometry, lung volumes, respiratory muscle flow/force measurements, cardiopulmonary stress testing, and/or cardio/vascular studies.    6-Minute Walk (02/15/19): Resting Heart Rate was 91. Maximum Heart Rate achieved 119. Patient did reach target heart rate (86-133). Resting O2 Saturation was 98% on RA. Resting BORG score was 1. O2 saturation nadir was 91% at minute 6. Total 6 minute walk distance was 381 meters. Predicted 6 minute walk distance of 558 m (LLN of 467m). Percent of predicted 6 minute walk distance of 68%. Heart rate returned to 96 bpm after 2 minutes of recovery. O2 saturation returned to 96% on RA after 2 minutes of recovery. Maximum BORG score was 7 at minute 6. Concluding BORG score was 2 after 2 minutes of recovery. No evidence of resting or exercise induced hypoxemia. No supplemental oxygen is required at this time.    6-Minute Walk (01/26/19): Resting Heart Rate was 86. Maximum Heart Rate achieved 97. Patient did reach target heart rate (86-133). Resting O2 Saturation was 99% on RA. Resting BORG score was 1. O2 saturation nadir was 95% at minutes 3 and 6. Total 6 minute walk distance was 350 meters. Predicted 6 minute walk distance of 558 m (LLN of 468m). Percent of predicted 6 minute walk distance of 63%. Heart rate returned to 86 bpm after 2 minutes of recovery. O2 saturation returned to 96% on RA after 2 /minutes of recovery. Maximum BORG score was 6 at minutes 5 and 6. Concluding BORG score was 1 after 2 minutes of recovery. No evidence of resting or exercise induced hypoxemia. No supplemental oxygen is required at this time.    Cultures:    Source Bacterial Culture AFB Smear AFB Culture   04/02/15 Sputum 2+ OPF; 1+ Probably mPsA Negative Negative   07/02/15 Sputum 4+ OPF; 3+ mPsA; 3+ sPsA; 1+ Steno Negative Negative   01/23/16 Sputum 2+ OPF; 1+ mPsA; 1+ sPsA - -   03/24/16 Sputum 3+ OPF; 3+ mPsA; 3+ sPsA Negative Overgrown   07/21/16 Sputum 4+ OPF; 4+ mPsA; 2+ sPsA Negative Negative   12/29/16 Sputum 4+ OPF; 3+ mPsA; 3+ sPsA Negative Actinomadura sp   04/06/17 Sputum 3+ OPF; 4+ mPsA Negative Negative   07/06/17 Sputum 4+ OPF; 3+ mPsA Negative Negative   10/12/17 Sputum 4+ OPF; 3+ mPsA Negative Negative   01/18/18 Sputum OPF Negative Negative   05/17/18 Sputum 4+ OPF; 3+  mPsA - -   09/26/18 Sputum 4+ OPF; 3+ mPsA Negative Negative   01/26/19 Sputum 4+ OPF; 2+ mPsA Negative Negative   02/15/19 Sputum 3+ OPF; 3+ mPsA Negative Negative   06/06/19 Sputum 3+ OPF; 3+ sPsA; 3+ mPsA Negative Negative   11/10/19 Sputum 2+ OPF; 3+ mPsA Negative Negative   02/13/20 Sputum OPF Negative Negative   07/02/20 Sputum 3+ OPF; 1+sPsA; 3+ mPsA negative negative   10/31/20 Sputum 4+ OPF; 2+ sPsA; 4+ mPsA negative negative   11/17/20 Sputum 3+ OPF; 3+ mPsA negative negative   02/21/21 Sputum Few PsA negative negative   02/27/21 Sputum Scant C albicans and light A fumigatus - -   03/18/21 Sputum 2+ OPF; 1+ mPsA negative negative   03/26/21 Sputum 4+ OPF; 3+ mPsA negative negative      Aspergillus Ag BAL/serum (04/02/21): 0.55 (@ [facility])     Bronchiectasis Evaluation   IgG with subclasses:   Lab Results   Component Value Date/Time    IGGT 996 04/28/2012 06:21 AM    IGG1 402 04/28/2012 06:21 AM    IGG2 399 04/28/2012 06:21 AM    IGG3 148.0 (H) 04/28/2012 06:21 AM    IGG4 47.7 04/28/2012 06:21 AM     IgA (09/21/11): 283   IgM (09/21/11): 52   IgE:   Lab Results   Component Value Date/Time    IGE 16.6 02/13/2020 10:24 AM     Specific Titers: No results found for: TETANUSAB, TETGV, DIPHTERIAAB, DIPGV, S. pneumonia titers  HIV (05/02/20): Non-reactive  Autoimmune (05/02/20): ANA negative   ANCA: No results found for: ANCA, IFA, PR3ELISA, PR3QT, MPO, MPOQT  CBC-D (05/02/20): 6.1>14.8/44.3<156; ANC 4.1; ALC 1.2; AEC 0.17  Lab Results   Component Value Date/Time    WBC 4.1 11/20/2020 04:03 AM    WBC 6.1 11/19/2020 03:39 AM    WBC 7.7 11/18/2020 03:29 AM    WBC 7.9 08/13/2013 03:57 AM    WBC 9.4 08/12/2013 04:21 AM    WBC 11.7 (H) 08/11/2013 04:04 AM    HGB 11.8 (L) 11/20/2020 04:03 AM    HGB 14.8 03/24/2015 05:31 PM    HCT 34.5 (L) 11/20/2020 04:03 AM    HCT 30.8 (L) 08/13/2013 03:57 AM    PLT 134 (L) 11/20/2020 04:03 AM    PLT 292 08/13/2013 03:57 AM    NEUTROABS 7.8 11/17/2020 02:31 PM    NEUTROABS 3.7 02/13/2020 10:24 AM    NEUTROABS 3.2 04/19/2017 05:45 AM    NEUTROABS 4.9 06/21/2013 09:31 PM    NEUTROABS 4.6 06/21/2013 08:13 PM    NEUTROABS 4.1 04/26/2012 11:54 PM    LYMPHSABS 1.4 11/17/2020 02:31 PM    LYMPHSABS 1.4 02/13/2020 10:24 AM    LYMPHSABS 1.4 (L) 04/19/2017 05:45 AM    LYMPHSABS 1.9 06/21/2013 09:31 PM    LYMPHSABS 1.8 06/21/2013 08:13 PM    LYMPHSABS 2.8 04/26/2012 11:54 PM    EOSABS 0.2 11/17/2020 02:31 PM    EOSABS 0.2 02/13/2020 10:24 AM    EOSABS 0.2 04/19/2017 05:45 AM    EOSABS 0.2 06/21/2013 09:31 PM    EOSABS 0.2 06/21/2013 08:13 PM    EOSABS 0.3 04/26/2012 11:54 PM    BASOSABS 0.0 11/17/2020 02:31 PM    BASOSABS 0.0 06/21/2013 09:31 PM     Alpha-1 level: No results found for: A1TRYP  Alpha-1 genotype (11/11/10): MM  CF Sweat test:   Lab Results   Component Value Date/Time    AMTLFT 195 06/17/2012 11:39 AM  AMTRT 254 06/17/2012 11:39 AM    SWCLL 6 06/17/2012 11:39 AM    SWCLR 7 06/17/2012 11:39 AM     PCD testing: nNO (07/28/13): bilateral mean of 239.95 nl/min, which is Normal.      IgE (05/23/18):               Imaging  CTA Chest (04/15/21): Images personally reviewed. Sequelae of right middle lobectomy and right lower lobe wedge resection. Chronic consolidation in the medial posterior right upper lobe and right lung apex with volume loss and architectural distortion of the right upper lobe. Compensatory hyperexpansion of the left lung with mosaic attenuation consistent with areas of air trapping. Extensive chronic bilateral bronchiectasis and bronchial thickening. Unchanged diffuse bilateral tree-in-bud nodularity.  Mildly enlarged and tortuous right bronchial artery, predominantly supplying the right upper lung, arising from the lateral descending aorta at the level of T6 (7:58, 4:38). No enlarged left-sided bronchial arteries. No CT evidence of intrapulmonary arteriovenous malformation.     Chest CT (02/25/21): Images personally reviewed.  Stable sequelae of right middle lobectomy and right lower lobe wedge resection. 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 Stable bilateral diffuse extensive bronchiectasis, bronchial wall thickening, and sequelae of chronic endobronchial infection.        CXR (05/02/20): Images not available for review. Per report, Visualized cardiac and mediastinal contours within normal limits allowing for mediastinal shift to the right. Circumferential right pleural fluid/thickening. Volume loss in the right lung consistent with known right lung resection. Irregular opacities throughout both lungs, right greater than left, consistent with known bronchiectasis.     Chest CT (11/10/19): Images personally reviewed. Stable sequelae of right middle lobectomy and right lower lobe wedge resection. Stable consolidation in the medial posterior right upper lobe and right lung apex, bilateral diffuse extensive bronchiectasis and bronchial wall thickening and unchanged sequelae of chronic endobronchial infection.    Chest CT (02/15/19): Images personally reviewed. Stable sequelae of right middle lobectomy and right lower lobe wedge resection.  Unchanged diffuse extensive bronchiectasis and bronchial wall thickening bilaterally.  Diffusely scattered tree-in-bud nodules in bilateral lung parenchyma with interval increase in tree-in-bud nodules in left lung; the constellation of these findings are consistent with sequelae of worsening chronic endobronchial infection.    Chest CT (03/31/16): Images personally reviewed. Status post right middle and right lower lobectomies. Tiny loculated basilar right hydropneumothorax, probably chronic. Moderate cylindrical and varicoid bronchiectasis throughout both lungs, with associated scattered mucoid impaction, tree-in-bud opacities and scattered peribronchovascular and subpleural consolidation in the right upper lobe. 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 Two-vessel coronary atherosclerosis. Nonobstructing right nephrolithiasis.    Modified Barium Swallow (01/17/19): Shallow laryngeal penetration observed with thin liquids and puree.  No aspiration identified with any of the tested consistencies.    Immunization History   Administered Date(s) Administered    COVID-19 VAC,BIVALENT(59YR UP)BOOST,PFIZER 12/17/2020    COVID-19 VAC,MRNA,TRIS(12Y UP)(PFIZER)(GRAY CAP) 05/03/2020    COVID-19 VACC,MRNA,(PFIZER)(PF) 04/20/2019, 05/18/2019, 12/01/2019    Hepatitis A 05/13/2020    Hepatitis B, Adult 05/13/2020, 06/11/2020    INFLUENZA TIV (TRI) 65MO+ W/ PRESERV (IM) 11/13/2010, 01/07/2012, 12/09/2017, 11/30/2019, 12/17/2020    INFLUENZA TIV (TRI) PF (IM) 02/20/2012, 12/09/2012    Influenza Vaccine Quad (IIV4 PF) 72mo+ injectable 01/31/2016, 12/09/2017  Influenza Virus Vaccine, unspecified formulation 12/25/2014, 12/01/2016    Influenza Whole 12/21/2008    PNEUMOCOCCAL POLYSACCHARIDE 23 10/22/2010, 02/07/2016, 10/07/2019    Pneumococcal Conjugate 13-Valent 04/28/2013    TdaP 03/22/2012    ZOSTAVAX - ZOSTER VACCINE, LIVE, SQ 07/03/2014, 06/11/2017, 01/19/2018     Portions of this record have been created using [employer]. Dictation errors have been sought, but may not have been identified and corrected.

## 2021-06-30 NOTE — Unmapped (Signed)
Azole Antifungal Therapeutic Monitoring Pharmacy Note    Jordan [name] is a 66 y.o. male continuing voriconazole. Last CMP and voriconazole level completed 06/25/2021. See Care Everywhere.     Indication:  Treatment of aspergilloma    Prior Dosing Information: Current regimen 400mg  PO BID  - dose increase as of 05/12/21    Goals:  Therapeutic Drug Levels  Trough level: 1-48mcg/mL  >1 mcg/ml: okay for most indications  >2 mcg/ml: consider for invasive mold infections (including aspergillosis), CNS  infections, elevated MICs, or disseminated infections     Additional Clinical Monitoring/Outcomes  Monitor QTc, renal function (SCr and UOP), and liver function (LFTs)    Results:  Voriconazole level: 2.2 micrograms/mL 06/25/21    Longitudinal Dose Monitoring:  Date Dose Level  (mcg/mL) Key Drug Interactions   04/11/21 200mg  PO BID Not drawn  N/A (Symbicort dose reduced)   04/18/21 200mg  PO BID Not drawn N/A (Symbicort dose reduced)   04/25/21 200mg  PO BID 0.6 N/A (Symbicort dose reduced)   05/08/21 300mg  PO BID 0.9 N/A (Symbicort dose reduced)   05/20/21 400mg  PO BID 1.9 N/A (Symbicort dose reduced)   05/30/21 400mg  PO BID 1.8 N/A (Symbicort dose reduced)   06/25/21 400mg  PO BID 2.2 N/A (Symbicort dose reduced)     Wt Readings from Last 3 Encounters:   03/18/21 80.1 kg (176 lb 9.4 oz)   03/18/21 83 kg (183 lb)   11/18/20 80.1 kg (176 lb 9.4 oz)     Lab Results   Component Value Date    BILITOT 0.4 06/25/2021    ALBUMIN 3.5 06/25/2021    ALT 14 06/25/2021    AST 16 06/25/2021       Lab Results   Component Value Date    CREATININE 1.19 05/20/2021    CREATININE 1.05 05/30/2021    CREATININE 1.11 06/25/2021       Assessment/Plan:  Recommendation(s)  Baseline QTC on 8/28/222. Repeat EKG on 06/12/21 showed QTcB of .  CMP collected on 06/25/21: LFTs WNL. Creatinine very slightly increased.  No new medications identified in fill history or on medication list  Voriconazole level therapeutic.   Continue voriconazole to 400 mg (5 mg/kg twice daily) PO BID.     Follow-up   Next level will take place in 1 month    Electronically signed by:  Alben Spittle, PharmD, BCACP, CPP  Clinical Pharmacist Practitioner  [facility] Adult Cystic Fibrosis/Pulmonary Clinic  [phone]

## 2021-07-01 ENCOUNTER — Ambulatory Visit
Admit: 2021-07-01 | Discharge: 2021-07-01 | Payer: PRIVATE HEALTH INSURANCE | Attending: Hospice and Palliative Medicine | Primary: Hospice and Palliative Medicine

## 2021-07-01 ENCOUNTER — Ambulatory Visit: Admit: 2021-07-01 | Discharge: 2021-07-01 | Payer: PRIVATE HEALTH INSURANCE

## 2021-07-01 ENCOUNTER — Ambulatory Visit
Admit: 2021-07-01 | Discharge: 2021-07-01 | Payer: PRIVATE HEALTH INSURANCE | Attending: Registered" | Primary: Registered"

## 2021-07-01 ENCOUNTER — Ambulatory Visit
Admit: 2021-07-01 | Discharge: 2021-07-01 | Payer: PRIVATE HEALTH INSURANCE | Attending: Internal Medicine | Primary: Internal Medicine

## 2021-07-01 DIAGNOSIS — J479 Bronchiectasis, uncomplicated: Principal | ICD-10-CM

## 2021-07-01 DIAGNOSIS — R042 Hemoptysis: Principal | ICD-10-CM

## 2021-07-01 DIAGNOSIS — J9611 Chronic respiratory failure with hypoxia: Secondary | ICD-10-CM | POA: Diagnosis not present

## 2021-07-01 DIAGNOSIS — B441 Other pulmonary aspergillosis: Secondary | ICD-10-CM | POA: Diagnosis not present

## 2021-07-01 DIAGNOSIS — Z72820 Sleep deprivation: Secondary | ICD-10-CM | POA: Diagnosis not present

## 2021-07-01 DIAGNOSIS — R2689 Other abnormalities of gait and mobility: Secondary | ICD-10-CM | POA: Diagnosis not present

## 2021-07-01 DIAGNOSIS — E43 Unspecified severe protein-calorie malnutrition: Secondary | ICD-10-CM | POA: Diagnosis not present

## 2021-07-01 DIAGNOSIS — A498 Other bacterial infections of unspecified site: Secondary | ICD-10-CM | POA: Diagnosis not present

## 2021-07-01 DIAGNOSIS — Z515 Encounter for palliative care: Secondary | ICD-10-CM | POA: Diagnosis not present

## 2021-07-01 DIAGNOSIS — J471 Bronchiectasis with (acute) exacerbation: Secondary | ICD-10-CM | POA: Diagnosis not present

## 2021-07-01 DIAGNOSIS — B449 Aspergillosis, unspecified: Principal | ICD-10-CM

## 2021-07-01 DIAGNOSIS — R630 Anorexia: Principal | ICD-10-CM

## 2021-07-01 DIAGNOSIS — R0602 Shortness of breath: Principal | ICD-10-CM

## 2021-07-01 MED ORDER — MIRTAZAPINE 15 MG TABLET
ORAL_TABLET | Freq: Every evening | ORAL | 3 refills | 90 days
Start: 2021-07-01 — End: 2021-07-31

## 2021-07-01 NOTE — Unmapped (Signed)
Adult Pulmonary Specialty Clinic Pharmacist Note     Met with patient to discuss concerns with equilibrium and starting mirtazipine for appetite, mood and sleep. Reports that he does not feel dizzy but feels unsteady, things look like they are moving (not spinning) but they are not. This happened back in 2014 but has not had any issues until he started on voriconazole. With each dose increase balance would worsen for a few days and then resolve. For the last few weeks the unsteadiness has been more and more consistent. Denies changes to vision or ringing in ears. The unsteady feeling comes and goes but most commonly when he stands. Currently eating half of what he use to eat and seems to be getting fuller sooner. Waking a few times a night to use the restroom.     Agree with pulmonologist in referral to ENT. If ENT is not able to identify a cause of unsteadiness could consider dose reduction of hydrocodone/acetaminophen as voriconazole can increase hydrocondone and may be contributing to feelings of unsteadiness. Advised that I would like to make one change at a time so we can identify what is and is not helpful. Discussed mirtazapine to help with appetite and sleep. Reviewed dosing and potential side effects including but not limited to increased weight gain, sedation, anticholinergic effects. Will get repeat EKG in 2-4 weeks.        Total time spent: 15 minutes     Electronically signed:  Alben Spittle, PharmD, BCACP, CPP  Clinical Pharmacist Practitioner  [facility] Adult Cystic Fibrosis/Pulmonary Clinic  [phone]

## 2021-07-01 NOTE — Unmapped (Signed)
Patient has been using oxygen all the time now and feels much more short of breath.  He is interested in trying the Life 2000.

## 2021-07-01 NOTE — Unmapped (Addendum)
Nutrition Plan:   - Continue supplemental Ensure between meals and setting alarms for meals/snacks. Don't force-feed if feeling overly full.

## 2021-07-01 NOTE — Unmapped (Signed)
Outpatient Palliative Care Consult Note    Reason for Consult Request: Goals of Care / Decision Making, Patient and Family Support and advance care planning  Primary Care Provider:  Barbaraann Boys, MD  Referring physician: Dr. Rolm Baptise    Assessment: Jordan [name] is a 66 y.o. male with bronchiectasis with chronic pseudomonas colonization and chronic hypoxemic respiratory failure requiring supplemental O2 who presents to palliative care for patient support, advance care planning and decision-making.       Plan:   # Imbalance: c/f side effect of voriconazole by patient. Planning to see ENT for further evaluation per Dr. Garner Nash.  - Counseled patient that mirtazapine is less likely to cause hangover effect compared to other sleep aides but to be mindful that he may feel a little more drowsy in the morning once he starts taking it and this may effect balance.   - Consider PT/OT if continues    # Poor appetite: fulls full easily; eating half portions, weight has improved since hospitalization and reports weight gain of 5-6 pounds since December. Drinking ensures and denies constipation.   - Counseling and education provide on potential causes of decreased appetite including underlying illness, respiratory effort and side effect of medications. It's reassuring that he is gaining/maintaing weight despite reports of eating less than his prior baseline. Mirtazapine has been recommended by Dr. Garner Nash which may help with appetite. Shared that it's more likely to help with sleep than appetite and to monitor for risk/benefit profile given very extensive medication list already.  - Starting mirtazapine per Dr. Garner Nash recommendation.    # Poor sleep: wakes up several times per night and not feeling fully rested.  - Agree with trial of mirtazapine which may also help with appetite.    # Dyspnea/cough: due to bronchiectasis, worsening in setting of hemoptysis in December thought possibly due to aspergilloma. This has been improving although not where it was at our last visit.    - Continue management as per Dr. Garner Nash- pulmonary therapies and will be getting new oxygen concentrator.  - No indication for opioids for refractory dyspnea at this time    # Goals of care:  - Understanding of medical condition and prognosis: Knows he has a chronic and progressive disease. He has been told he is not a lung transplant candidate.  - Goals and Priorities: Spend time with family and be able to go to his grandchildren's activities.    # Advance Care Planning  - Surrogate Decision-maker and/or Health care agent:   HCDM (HCPOA): Jordan [name], Jordan [name] - [phone] - updated in chart  - Advance Directives: He reports having an HCPOA and Living Will    5. Coping/Support:  - Strong family support and strong faith.    6. Care coordination  - Reviewed prior notes from Dr. Garner Nash and from hospitalization at [facility] in December 2022 found in EMR that showed recent issue with hemoptysis likely due to aspergilloma and now on voriconazole.      F/u: Will coordinate with his next visit with Dr. Garner Nash.  ---------------------------------------    Since last visit was hospitalized in December for hemoptysis. He was to undergo bronchoscopy but this was deferred as oxygen level was too low. 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 He has an upcoming appointment for bronchial artery embolization. He lost weight after he was hospitalized but has gained 5-6 pounds back and his weight has been stable. He doesn't have much appetite  though and gets easily full. He is not sleeping well; wakes up frequently through the night. He continues to spend time with family and go to his grandchildren's sporting events. Him and his wife are going to take some of his grandchildren to the science museum this week. He gets short of breath with exertion and needs to take breaks.     Palliative Performance Scale: 60% - Ambulation: Reduced / unable to do hobby or some housework, significant disease / Self-Care:Occasional assist as necessary / Intake:Normal or reduced / Level of Conscious: Full or confusion    PHYSICAL EXAM:   Vital signs for this encounter: There were no vitals taken for this visit.  Older man, appears tired, wearing supplemental O2 and not in distress, breathing appears unlabored and no accessory muscle use noted. Some muscle atrophy noted in upper arms and mild temporal wasting.       I personally spent 45 minutes face-to-face and non-face-to-face in the care of this patient, which includes all pre, intra, and post visit time on the date of service.    Darrin Nipper, MD  [facility] Palliative Care

## 2021-07-01 NOTE — Unmapped (Signed)
[facility] Outpatient Nutrition Services   Medical Nutrition Therapy Consultation       Visit Type:    Follow-up Assessment    Referral Reason: :  10 pounds weight loss over the past 2 months, poor appetite     Jordan [name] is a 66 y.o. male seen for medical nutrition therapy for unintentional weight loss. His active problem list, medication list, allergies, family history, and notes from last encounter were reviewed.     His interim medical history is significant for recent admission to hospital for worsening dyspnea.    Update 04/11: Patient reports that he has put alarms on his calendar to remind him to eat. He says that he frequently drinks Ensure but feels full 3 hours later. He also reports early satiety. Dr. Garner Nash mentioned that she is considering adding Remeron to promote appetite. He isn't able to eat as much as he used to in the past. He can eat about half as much as he used to. He says that his symptoms seem to be relatively stable and don't seem to have a precipitating factor.     Anthropometrics   Height: 174 cm (5' 8.5)   Weight: 81.2 kg (179 lb 0.2 oz)   Body mass index is 26.82 kg/m??.    Wt Readings from Last 5 Encounters:   07/01/21 81.2 kg (179 lb 0.2 oz)   07/01/21 81.2 kg (179 lb)   03/18/21 80.1 kg (176 lb 9.4 oz)   03/18/21 83 kg (183 lb)   11/18/20 80.1 kg (176 lb 9.4 oz)        Usual body weight:. Patient reports that his weight has trended up since being admitted to the hospital recently, likely due to Ensure supplements. He says that he would like to gain some weight in order to have more energy and to have some wiggle room in case he loses weight while sick again. Question accuracy of documented weights. Patient reports that he weighed 168 pounds at his lowest, but thinks that he has gained a couple pounds back. He says that he would like to weigh 175 pounds ??2 pounds.    Update 04/11: Patient reports weight has improved a little bit despite not eating as much as he would like. This is consistent with documented weights.     Ideal Body Weight: 71.29 kg  Weight in (lb) to have BMI = 25: 166.5    Nutrition Risk Screening:   Food Insecurity: No Food Insecurity    Worried About Programme researcher, broadcasting/film/video in the Last Year: Never true    Ran Out of Food in the Last Year: Never true        Nutrition Focused Physical Exam:  Deferred due to being in consult room.        Malnutrition Screening:   Pending physical exam.     Biochemical Data, Medical Tests and Procedures:  All pertinent labs and imaging reviewed by Craig Staggers at 10:55 AM 07/01/2021. No pertinent labs at this time.       Medications and Vitamin/Mineral Supplementation:   All nutritionally pertinent medications reviewed on 07/01/2021.   No nutritionally-pertinent medications identified at this time.   He is taking nutrition supplements including     Current Outpatient Medications   Medication Sig Dispense Refill    albuterol 2.5 mg /3 mL (0.083 %) nebulizer solution INHALE 3 ML (2.5 MG TOTAL) BY NEBULIZATION TWO (2) TIMES A DAY. 525 mL 3    albuterol HFA  90 mcg/actuation inhaler Inhale 2 puffs Four (4) times a day. (Patient taking differently: Inhale 2 puffs four (4) times a day as needed.) 25.5 g 3    alcohol swabs (ALCOHOL PREP PADS) PadM Use as directed with inhaled antibiotics 100 each 5    alendronate (FOSAMAX) 70 MG tablet TAKE 1 TABLET BY MOUTH EVERY 7 DAYS FOR 12 DOSES      ascorbic acid, vitamin C, (VITAMIN C) 1000 MG tablet Take 1,000 mg by mouth daily.      atorvastatin (LIPITOR) 10 MG tablet Take 1 tablet (10 mg total) by mouth daily.      azithromycin (ZITHROMAX) 250 MG tablet TAKE 1 TABLET BY MOUTH EVERY DAY 90 tablet 3    budesonide-formoteroL (SYMBICORT) 80-4.5 mcg/actuation inhaler Inhale 2 puffs Two (2) times a day. 10.2 g 11    calcium carbonate (CALCIUM 600 ORAL) Take 2 tablets by mouth two (2) times a day. 1 in morning and 1 in the evening      cholecalciferol, vitamin D3-50 mcg, 2,000 unit,, 50 mcg (2,000 unit) cap Take 2,000 Units by mouth daily.      coenzyme Q10 300 mg cap Take 10-300 mg by mouth daily.      colistimethate (COLYMYCIN) 150 mg injection Inject 2 mL sterile water for injection to mix colistin vial, then draw up 2 mL (150mg ) and inhale 2 times a day, 28 days on and 28 days off. 60 each 5    dornase alfa (PULMOZYME) 1 mg/mL nebulizer solution Inhale 1 ampule (2.5 mg) daily. Use at least 30-60 minutes before airway clearance, or after airway clearance 225 mL 3    empty container Misc USE AS DIRECTED 1 each 2    ferrous sulfate 325 (65 FE) MG tablet Take 325 mg by mouth Two (2) times a day. BID      HYDROcodone-acetaminophen (NORCO) 7.5-325 mg per tablet Take 1 tablet by mouth three (3) times a day (at 6am, noon and 6pm).      montelukast (SINGULAIR) 10 mg tablet TAKE 1 TABLET BY MOUTH EVERY DAY AT NIGHT 90 tablet 3    multivitamin-minerals-lutein Tab Take 1 tablet by mouth daily.       nebulizers (LC PLUS) Misc Use as directed with tobramycin 1 each 5    nebulizers (LC PLUS) Misc use with Colistin 1 each 5    nebulizers (LC PLUS) Misc Use as directed with hypertonic saline 10% 3 each 3    needle, disp, 21 G (BD REGULAR BEVEL NEEDLES) 21 gauge x 1 1/2 Ndle Use as directed with inhaled Colistin 100 each 3    omeprazole (PRILOSEC) 20 MG capsule Take 1 capsule (20 mg total) by mouth Two (2) times a day.      sodium chloride (BD POSIFLUSH NORMAL SALINE 0.9) 0.9 % injection Inject 2mL of 0.9%NaCl into colistin vial & gently mix. After withdrawing colistin dose, add an additional 1mL of 0.9%NaCl to neb cup with the colistin dose. 180 mL 11    sodium chloride 10 % Nebu Inhale 5 mL by nebulization Two (2) times a day. Discard remaining amount and use a new vial for each dose. 900 mL 3    sterile water Soln Use 2mL to mix Colistin, then add additional 2mL to neb cup with 2mL of mixed colistin. Inhale twice daily 28 days on and 28 days off. 600 mL 5    syringe with needle (BD LUER-LOK SYRINGE) 3 mL 20 gauge x 1 1/2 Syrg For use with  inhaled antibiotic (Colistin) 60 each 30    syringe with needle (BD LUER-LOK SYRINGE) 3 mL 21 gauge x 1 Syrg For use with inhaled antibiotic (Colistin) 60 each 5    tiotropium bromide (SPIRIVA RESPIMAT) 2.5 mcg/actuation inhalation mist Inhale 2 puffs daily. 12 g 3    tobramycin, PF, ([name]) 300 mg/5 mL nebulizer solution Inhale 5 mL (300 mg total) by nebulization every twelve (12) hours. 28 days on and 28 days off. 280 mL 5    voriconazole (VFEND) 200 MG tablet Take 2 tablets (400 mg total) by mouth Two (2) times a day. 120 tablet 3    zinc gluconate 50 mg (7 mg elemental zinc) tablet Take 50 mg by mouth.       No current facility-administered medications for this visit.       Nutrition History:   Dietary Restrictions: Patient reports difficulty tolerating chocolate and certain types of dairy, although he said that he has no issues with yogurt or Ensure.     Hunger and Satiety: Poor appetite and limited intake.     Food Safety and Access: No to little issues noted.     Patient reports that he has a greatly decreased appetite since being in the hospital with pulmonary issues earlier this year. He said that he was started on 3 Ensure supplements per day at the hospital and tolerated that well, and has continued that since being at home. Patient reports that before the hospital he might of been able to eat 2 sausage biscuits or a full steak, but now he is only able to eat half of that amount at once. He says that he he is unable to eat significant amount of food even when he enjoys them. Patient reports that he still sometimes gets hungry if he goes a long time without eating. He said that he had an Ensure this morning but has not had anything since then, and he has now developed an appetite. Patient says that he relies on alarms to help remind him of things that he needs to do during the day. Patient seemed very motivated to gain weight. When given a handout with potential strategies for weight gain. He showed interest in adding extra calories to foods, but ultimately decided that eating high calorie snacks, scheduling alarms to remind him to eat every 3 hours, and swapping out the water that he usually drinks for high-calorie beverages would be ideal for gaining weight. Patient reports that he usually drinks water or coffee but really enjoys orange juice; his wife added that she has been buying him orange juice recently to get him extra calories. Patient says that he has a bunch of peanut butter crackers at home and uses the Ensure supplements as a snack as well. He is also interested in getting breakfast bars at the grocery store. Dietitian recommended looking for breakfast bars that patient enjoys and which also contain adequate protein. Patient reports that he very rarely eats out, although he sometimes gets takeout food.    Eating Behaviors:  No concerns noted.     Physical Activity:  Physical activity level is sedentary with little to no exercise.     Daily Estimated Nutritional Needs:  Energy: 2030-2440 kcals [25-30 kcal/kg using last recorded weight, 81.2 kg (07/01/21 1126)]  Protein: 80-95 gm [1.0-1.2 gm/kg using last recorded weight, 81.2 kg (07/01/21 1126)]  Carbohydrate:   [no restriction]  Fluid:   [per MD team ]    Nutrition Goals & Evaluation  Superordinate goal(s):  Gain weight to reach goal weight of 175 lb  (Met)    Intermediate goal(s):  Have more energy  (Ongoing and Progressing)  Have wiggle room in case he loses weight during future illness  (Ongoing and Progressing)    Nutrition goals reviewed, and relevant barriers identified and addressed: none evident. He is evaluated to have excellent willingness and ability to achieve nutrition goals.     Nutrition Assessment       Patient mostly tolerating PO intake and likely eating enough to meet energy needs as evidenced by weight gain, but might benefit from an appetite stimulant to promote greater appetite and enjoyment of meals. Nutrition Intervention        Nutrition Plan:   - Consider addition of an appetite stimulant to promote greater enjoyment of meals. This was discussed with attending, patient and clinical pharmacist.  - Continue supplemental Ensure between meals and setting alarms for meals/snacks. Don't force-feed if feeling overly full.     Follow up will occur at next bronchiectasis appointment.      Food/Nutrition-related history, Anthropometric measurements, Biochemical data, medical tests, procedures and Nutrition-focused physical findings will be assessed at time of follow-up.       Recommendations for Clinical Team:  Continue excellent clinical care.  Consider addition of an appetite stimulant. See above. This was discussed with team who was amenable to considering it.       Time spent 15 minutes        Karie Chimera, MS, RD, LDN, CNSC  Pager # [phone]

## 2021-07-02 MED ORDER — MIRTAZAPINE 15 MG TABLET
ORAL_TABLET | Freq: Every evening | ORAL | 3 refills | 90 days | Status: CP
Start: 2021-07-02 — End: 2022-07-02

## 2021-07-03 NOTE — Unmapped (Addendum)
Fax sent for EKG.  Confirmation of successful transmittal received.

## 2021-07-04 NOTE — Unmapped (Signed)
Faxed prescription, PFTs and routed notes to [facility].  Patient notified via MyChart.

## 2021-07-04 NOTE — Unmapped (Signed)
Faxed signed, requested CMN for oxygen to [facility].

## 2021-07-07 NOTE — Unmapped (Signed)
Routed most recent clinic note to [facility] oxygen as requested.

## 2021-07-08 MED ORDER — EMPTY CONTAINER
0 refills | 0 days
Start: 2021-07-08 — End: ?

## 2021-07-08 NOTE — Unmapped (Signed)
[facility] Refill Coordination Note    Specialty Medication(s) to be Shipped:   CF/Pulmonary: -Nebulized colistin (150mg  vials) and supply kit  Other medication(s) to be shipped: BD Luer- Lok Syringes, BD Posiflush NS, BD Regular Needles, LC Plus neb cups      Jordan [name], DOB: 20-Jun-1955  Phone: [phone] (home)     All above HIPAA information was verified with patient.     Was a Nurse, learning disability used for this call? No    Completed refill call assessment today to schedule patient's medication shipment from the [facility] [phone]).  All relevant notes have been reviewed.     Specialty medication(s) and dose(s) confirmed: Regimen is correct and unchanged.   Changes to medications: [name] reports no changes at this time.  Changes to insurance: No  New side effects reported not previously addressed with a pharmacist or physician: None reported  Questions for the pharmacist: No    Confirmed patient received a Conservation officer, historic buildings and a Surveyor, mining with first shipment. The patient will receive a drug information handout for each medication shipped and additional FDA Medication Guides as required.       DISEASE/MEDICATION-SPECIFIC INFORMATION        For CF patients: CF [employer] Active? No-not enrolled    SPECIALTY MEDICATION ADHERENCE     Medication Adherence    Patient reported X missed doses in the last month: 0  Specialty Medication: Tobramycin 300mg /11ml Neb Solu  Patient is on additional specialty medications: Yes  Additional Specialty Medications: Colistimethate 150mg  Inj  Patient Reported Additional Medication X Missed Doses in the Last Month: 0  Patient is on more than two specialty medications: No  Informant: patient  Reliability of informant: reliable  Reasons for non-adherence: no problems identified  Support network for adherence: family member  Confirmed plan for next specialty medication refill: delivery by pharmacy  Refills needed for supportive medications: not needed        Were doses missed due to medication being on hold? No    Tobramycin 300mg /20ml Neb Solu: 0 days of medicine on hand   Colistimethate 150mg  Inj: 0 days of medicine on hand (Start on 07/21/21)    REFERRAL TO PHARMACIST     Referral to the pharmacist: Not needed    [facility]     Shipping address confirmed in Epic.     Delivery Scheduled: Yes, Expected medication delivery date: 07/15/2021.     Medication will be delivered via UPS to the prescription address in Epic WAM.    Anisia Leija P Wetzel [address] [facility] Specialty Technician

## 2021-07-09 NOTE — Unmapped (Signed)
VIR pre procedure prep call completed. Reviewed to [facility] at [facility] then proceed to [facility] for [facility] check-in.  Informed of no show/late cancellation policy. NPO guidelines reviewed. Pt OK to take sips of clear liquids with all AM meds. COVID screening completed. 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 Pt verbalized understanding. All questions answered.

## 2021-07-09 NOTE — Unmapped (Signed)
Adult Pulmonary Specialty Clinic Pharmacist Note     Purpose: Follow up on mychart message: 'Feet Swelling and Little more Sleepiness'    July 09, 2021 10:42 AM: Swelling in feet is a bit better today, able to see ankles. Just drank water yesterday. Denies changes to breathing or cough. No h/o heart failure. Has struggled with daytime sleepiness for some time but has been harder to wake up in the morning since starting mirtazapine. Started mirtazapine on the April 14th and has been taking about 30 mins before bed. Has not yet noticed any changes to appetite. Still waking up during the night but this is due to having to use the restroom s/p prostatectomy. Patient is comfortable with continuing the medication but wanted to make sure these changes were expected and not concerning.     Peripheral edema is not a common side effect with mirtazapine but it can happen. Voriconazole can increase systemic exposure of mirtazapine so suspect this is the cause of the peripheral edema. No h/o heart failure and no changes to breathing or cough so this seems unlikely to be the cause. Not taking mirtazapine at the same time as hydrocodone/acetaminophen but this combination could also be exacerbating sedative effects. Discussed that tolerance tends to develop to the sedating effects of mirtazapine. Do not want to increase dose of mirtazapine at this time given the voriconazole DDI and peripheral edema. Can consider taking mirtazapine a bit earlier in the evening to see if this lessens daytime sleepiness.     Plan to continue mirtazapine 15mg  once nightly for now. Patient to send follow up myChart message in 1-2 weeks and let us know how he is tolerating the medications.     Total time spent: 12 minutes     Electronically signed:  Alben Spittle, PharmD, BCACP, CPP  Clinical Pharmacist Practitioner  [facility] Adult Cystic Fibrosis/Pulmonary Clinic  [phone]     CC:   Rolm Baptise, MD  Alvino Blood, RN (NCF-Bronchiectasis Nurse Coordinator)

## 2021-07-11 ENCOUNTER — Ambulatory Visit: Admit: 2021-07-11 | Discharge: 2021-07-11 | Payer: PRIVATE HEALTH INSURANCE

## 2021-07-11 DIAGNOSIS — R042 Hemoptysis: Secondary | ICD-10-CM | POA: Diagnosis not present

## 2021-07-11 DIAGNOSIS — I10 Essential (primary) hypertension: Secondary | ICD-10-CM | POA: Diagnosis not present

## 2021-07-11 DIAGNOSIS — K219 Gastro-esophageal reflux disease without esophagitis: Secondary | ICD-10-CM | POA: Diagnosis not present

## 2021-07-11 DIAGNOSIS — J479 Bronchiectasis, uncomplicated: Principal | ICD-10-CM

## 2021-07-11 MED ADMIN — midazolam (VERSED) injection: INTRAVENOUS | @ 20:00:00 | Stop: 2021-07-11

## 2021-07-11 MED ADMIN — lidocaine (XYLOCAINE) 10 mg/mL (1 %) injection: SUBCUTANEOUS | @ 19:00:00 | Stop: 2021-07-11

## 2021-07-11 MED ADMIN — fentaNYL (PF) (SUBLIMAZE) injection: INTRAVENOUS | @ 19:00:00 | Stop: 2021-07-11

## 2021-07-11 MED ADMIN — iohexoL (OMNIPAQUE) 300 mg iodine/mL solution 75 mL: 75 mL | INTRA_ARTERIAL | @ 20:00:00 | Stop: 2021-07-11

## 2021-07-11 MED ADMIN — midazolam (VERSED) injection: INTRAVENOUS | @ 19:00:00 | Stop: 2021-07-11

## 2021-07-11 MED ADMIN — fentaNYL (PF) (SUBLIMAZE) injection: INTRAVENOUS | @ 20:00:00 | Stop: 2021-07-11

## 2021-07-11 NOTE — Unmapped (Signed)
[place] INTERVENTIONAL RADIOLOGY - Operative Note     VIR Post-Procedure Note    Procedure Name: bronchial artery embo    Pre-Op Diagnosis: hemoptysis     Post-Op Diagnosis: Same as pre-operative diagnosis    Time out: Prior to the procedure, a time out was performed with all team members present. During the time out, the patient, procedure and procedure site when applicable were verbally verified by the team members and Raquel James and [employer].      VIR Providers    Attending: Dr. Orlando Penner   Resident: Raquel James, MD  Description of procedure: Successful right bronchial artery embolization with particles. Right groin closure with angioseal.     Estimated Blood Loss: approximately <5 mL  Complications: None    See detailed procedure note with images in PACS [facility]).    The patient tolerated the procedure well without incident or complication and left the room in stable condition.    Raquel James, MD  07/11/2021 4:21 PM

## 2021-07-11 NOTE — Unmapped (Signed)
[place] INTERVENTIONAL RADIOLOGY - Pre Procedure H/P  Patient name: Jordan [name]  CSN: [id number]  MRN: [id number]  Date of Procedure:       Assessment/Plan:    Mr. Snoke is a 66 y.o. male who will undergo bronchial artery embolization  in Interventional Radiology.    Consent obtained in clinic by Emeterio Reeve, M.D. on 07/11/2021 and is accurate.  Consent was witnessed and scanned into the medical record.  Risks, benefits and alternatives including but not limited to bleeding, infection, and damage to adjacent structure were discussed in-depth with patient/patient's authorizing representative.   --The patient will accept blood products in an emergent situation.  --The patient does not have a Do Not Resuscitate order in effect.      HPI: Mr. Lothamer is a 66 y.o. male with a history of bronchiectasis, with a sequelae of hemoptysis, who presents for bronchial artery embolization.     Past Medical History:   Diagnosis Date    Abscess of lung (CMS-HCC) 11/06/2010    CT Chest 10/22/10 12/05/2010 right thoracotomy resection of right middle lobe and resection of right lower lobe abscess     Arthritis     back    Biceps tendon tear 2013    left side    Bronchiectasis (CMS-HCC)     chronic psuedomonas infection    Degenerative joint disease of left acromioclavicular joint     GERD (gastroesophageal reflux disease)     Hypertension     Obstructive sleep apnea on CPAP 03/14/13    AHI 33.5, on BiPAP 12/8 based on sleep study 09/2013    Pneumonia 2012    Lung abcess     PONV (postoperative nausea and vomiting)     Prostate cancer (CMS-HCC) 09/09/2017    Rotator cuff injury left    Vertigo        Past Surgical History:   Procedure Laterality Date    APPENDECTOMY  1969    BACK SURGERY      BRONCHOSCOPY  10/19/2010    [facility]    CARPAL TUNNEL RELEASE  1999    LUNG REMOVAL, PARTIAL Right 10/2010    RML and RLL partially resected    MENISCECTOMY  2011    NECK SURGERY  1993, 1999, 2002    PR GERD TST W/ MUCOS IMPEDE ELECTROD,>1HR N/A 07/06/2012    Procedure: ESOPHAGEAL FUNCTION TEST, GASTROESOPHAGEAL REFLUX TEST W/ NASAL CATHETER INTRALUMINAL IMPEDANCE ELECTRODE(S) PLACEMENT, RECORDING, ANALYSIS AND INTERPRETATION; PROLONGED;  Surgeon: None None;  [facility];  Service: Gastroenterology    PR OPEN TREAT RIB FRACTURE W/INT FIXATION, UNILATERAL, 1-2 RIBS Right 03/19/2015    Procedure: OPEN TREATMENT OF RIB FRACTURE REQUIRING INTERNAL FIXATION, UNILATERAL; 1-2 RIBS;  Surgeon: Evert Kohl, MD;  Location: MAIN OR [place];  Service: Cardiothoracic    PR THORACOTOMY W/THERAP WEDGE RESEXN ADDL IPSILATRL Right 08/07/2013    Procedure: THORACOTOMY; WITH THERAPEUTIC LOBECTOMY OF RIGHT MIDDLE AND RIGHT LOWER LOBE RESECTION , EACH ADDITIONAL RESECTION, IPSILATERAL;  Surgeon: Alvester Chou, MD;  Location: MAIN OR [facility];  Service: Cardiothoracic    TONSILLECTOMY  1965        Allergies:   Allergies   Allergen Reactions    Gentamicin Other (See Comments)     BALANCE ISSUES    Tobramycin Other (See Comments)     ototoxicity    Morphine Nausea And Vomiting       Medications:  No relevant medications, please see full medication list in Epic.  ASA Grade: ASA 3 - Patient with moderate systemic disease with functional limitations    PE:    There were no vitals filed for this visit.  General: male in NAD.  Airway assessment: Class 1 - Can visualize soft palate, fauces, uvula, and tonsillar pillars  Lungs: Respirations nonlabored on 2L on [place].

## 2021-07-14 MED FILL — BD POSIFLUSH NORMAL SALINE 0.9 % INJECTION SYRINGE: 30 days supply | Qty: 180 | Fill #2

## 2021-07-14 MED FILL — BD REGULAR BEVEL NEEDLES 21 GAUGE X 1 1/2": 30 days supply | Qty: 60 | Fill #2

## 2021-07-14 MED FILL — EMPTY CONTAINER: 120 days supply | Qty: 1 | Fill #0

## 2021-07-14 MED FILL — BD LUER-LOK SYRINGE 3 ML 21 GAUGE X 1": 30 days supply | Qty: 60 | Fill #2

## 2021-07-14 MED FILL — LC PLUS MISC: 30 days supply | Qty: 1 | Fill #0

## 2021-07-14 NOTE — Unmapped (Signed)
[facility] called stating they did not receive most recent progress note that was sent on 4/17.  Re-faxed & confirmation of successful transmittal rec'd

## 2021-07-17 ENCOUNTER — Ambulatory Visit: Payer: PPO | Admitting: Cardiology

## 2021-07-17 DIAGNOSIS — Z5181 Encounter for therapeutic drug level monitoring: Secondary | ICD-10-CM

## 2021-07-17 DIAGNOSIS — I10 Essential (primary) hypertension: Secondary | ICD-10-CM

## 2021-07-17 NOTE — Progress Notes (Signed)
Chief Complaint  ?Patient presents with  ? Nurse Visit - EKG  ? ?  ICD-10-CM   ?1. Primary hypertension  I10 EKG 12-Lead  ?  ? ?EKG 07/17/2021: Normal sinus rhythm with rate of 85 bpm, left axis deviation, left anterior fascicular block.  Incomplete right bundle branch block.  No evidence of ischemia.  No change from prior EKG. ?

## 2021-07-17 NOTE — Progress Notes (Signed)
EKG 07/17/2021: Normal sinus rhythm with rate of 85 bpm, left axis deviation, left anterior fascicular block.  Incomplete right bundle branch block.  No evidence of ischemia.  No change from prior EKG. Normal QT interval

## 2021-07-18 DIAGNOSIS — J479 Bronchiectasis, uncomplicated: Principal | ICD-10-CM

## 2021-07-18 MED ORDER — DORNASE ALFA 1 MG/ML SOLUTION FOR INHALATION
Freq: Every day | RESPIRATORY_TRACT | 3 refills | 90 days | Status: CP
Start: 2021-07-18 — End: 2022-07-18

## 2021-07-21 DIAGNOSIS — E785 Hyperlipidemia, unspecified: Secondary | ICD-10-CM | POA: Diagnosis not present

## 2021-07-21 DIAGNOSIS — M109 Gout, unspecified: Secondary | ICD-10-CM | POA: Diagnosis not present

## 2021-07-21 DIAGNOSIS — N400 Enlarged prostate without lower urinary tract symptoms: Secondary | ICD-10-CM | POA: Diagnosis not present

## 2021-07-21 DIAGNOSIS — I10 Essential (primary) hypertension: Secondary | ICD-10-CM | POA: Diagnosis not present

## 2021-07-21 DIAGNOSIS — M810 Age-related osteoporosis without current pathological fracture: Secondary | ICD-10-CM | POA: Diagnosis not present

## 2021-07-22 DIAGNOSIS — G4733 Obstructive sleep apnea (adult) (pediatric): Secondary | ICD-10-CM | POA: Diagnosis not present

## 2021-07-22 DIAGNOSIS — J471 Bronchiectasis with (acute) exacerbation: Secondary | ICD-10-CM | POA: Diagnosis not present

## 2021-07-22 DIAGNOSIS — J479 Bronchiectasis, uncomplicated: Secondary | ICD-10-CM | POA: Diagnosis not present

## 2021-07-23 DIAGNOSIS — G4733 Obstructive sleep apnea (adult) (pediatric): Secondary | ICD-10-CM | POA: Diagnosis not present

## 2021-07-23 DIAGNOSIS — E785 Hyperlipidemia, unspecified: Secondary | ICD-10-CM | POA: Diagnosis not present

## 2021-07-23 DIAGNOSIS — Z23 Encounter for immunization: Secondary | ICD-10-CM | POA: Diagnosis not present

## 2021-07-23 DIAGNOSIS — C61 Malignant neoplasm of prostate: Secondary | ICD-10-CM | POA: Diagnosis not present

## 2021-07-23 DIAGNOSIS — I10 Essential (primary) hypertension: Secondary | ICD-10-CM | POA: Diagnosis not present

## 2021-07-23 DIAGNOSIS — Z0000 Encounter for general adult medical examination without abnormal findings: Secondary | ICD-10-CM | POA: Diagnosis not present

## 2021-07-23 DIAGNOSIS — J479 Bronchiectasis, uncomplicated: Secondary | ICD-10-CM | POA: Diagnosis not present

## 2021-07-24 ENCOUNTER — Other Ambulatory Visit (HOSPITAL_COMMUNITY)
Admission: RE | Admit: 2021-07-24 | Discharge: 2021-07-24 | Disposition: A | Discharge status: Home / Self Care | Payer: PPO | Source: Ambulatory Visit | Attending: Cardiology | Admitting: Cardiology

## 2021-07-24 DIAGNOSIS — B449 Aspergillosis, unspecified: Secondary | ICD-10-CM | POA: Insufficient documentation

## 2021-07-24 DIAGNOSIS — I10 Essential (primary) hypertension: Secondary | ICD-10-CM | POA: Diagnosis not present

## 2021-07-24 DIAGNOSIS — J471 Bronchiectasis with (acute) exacerbation: Secondary | ICD-10-CM | POA: Diagnosis not present

## 2021-07-24 DIAGNOSIS — E785 Hyperlipidemia, unspecified: Secondary | ICD-10-CM | POA: Diagnosis not present

## 2021-07-24 DIAGNOSIS — G8929 Other chronic pain: Secondary | ICD-10-CM | POA: Diagnosis not present

## 2021-07-24 DIAGNOSIS — M159 Polyosteoarthritis, unspecified: Secondary | ICD-10-CM | POA: Diagnosis not present

## 2021-07-24 LAB — COMPREHENSIVE METABOLIC PANEL
ALT: 11 U/L (ref 0–44)
AST: 17 U/L (ref 15–41)
Albumin: 3.8 g/dL (ref 3.5–5.0)
Alkaline Phosphatase: 80 U/L (ref 38–126)
Anion gap: 7 (ref 5–15)
BUN: 19 mg/dL (ref 8–23)
CO2: 26 mmol/L (ref 22–32)
Calcium: 8.9 mg/dL (ref 8.9–10.3)
Chloride: 106 mmol/L (ref 98–111)
Creatinine, Ser: 0.97 mg/dL (ref 0.61–1.24)
GFR, Estimated: >60 mL/min (ref >60)
Glucose, Bld: 99 mg/dL (ref 70–99)
Potassium: 4.8 mmol/L (ref 3.5–5.1)
Sodium: 139 mmol/L (ref 135–145)
Total Bilirubin: 0.4 mg/dL (ref 0.3–1.2)
Total Protein: 7.7 g/dL (ref 6.5–8.1)

## 2021-07-28 LAB — VORICONAZOLE, SERUM: Voriconazole, Serum: 1.7 ug/mL

## 2021-07-29 NOTE — Unmapped (Unsigned)
[facility] Therapeutic Monitoring Pharmacy Note    Jordan [name] is a 66 y.o. male continuing voriconazole. Last CMP and voriconazole level completed 07/24/2021. See Care Everywhere.     Indication:  Treatment of aspergilloma    Prior Dosing Information: Current regimen 400mg  PO BID  - dose increase as of 05/12/21    Goals:  Therapeutic Drug Levels  Trough level: 1-28mcg/mL  >1 mcg/ml: okay for most indications  >2 mcg/ml: consider for invasive mold infections (including aspergillosis), CNS  infections, elevated MICs, or disseminated infections     Additional Clinical Monitoring/Outcomes  Monitor QTc, renal function (SCr and UOP), and liver function (LFTs)    Results:  Voriconazole level: 1.7 micrograms/mL 07/24/21    Longitudinal Dose Monitoring:  Date Dose Level  (mcg/mL) Key Drug Interactions   04/11/21 200mg  PO BID Not drawn  N/A (Symbicort dose reduced)   04/18/21 200mg  PO BID Not drawn N/A (Symbicort dose reduced)   04/25/21 200mg  PO BID 0.6 N/A (Symbicort dose reduced)   05/08/21 300mg  PO BID 0.9 N/A (Symbicort dose reduced)   05/20/21 400mg  PO BID 1.9 N/A (Symbicort dose reduced)   05/30/21 400mg  PO BID 1.8 N/A (Symbicort dose reduced)   06/25/21 400mg  PO BID 2.2 N/A (Symbicort dose reduced)   07/24/21 400mg  PO BID 1.7 N/A (Symbicort dose reduced)     Wt Readings from Last 3 Encounters:   07/11/21 79.4 kg (175 lb)   07/01/21 81.2 kg (179 lb 0.2 oz)   07/01/21 81.2 kg (179 lb)     Lab Results   Component Value Date    BILITOT 0.4 07/24/2021    ALBUMIN 3.8 07/24/2021    ALT 11 07/24/2021    AST 17 07/24/2021       Lab Results   Component Value Date    CREATININE 1.19 05/20/2021    CREATININE 1.05 05/30/2021    CREATININE 1.11 06/25/2021    CREATININE 0.97 07/24/2021       Assessment/Plan:  Recommendation(s)  Baseline QTC on 8/28/222. Repeat EKG on 06/12/21 showed QTcB of .  CMP collected on 07/24/21: LFTs WNL. Creatinine improved.  No new medications identified in fill history or on medication list  Voriconazole level slightly subtherapeutic.   Continue voriconazole to 400 mg (5 mg/kg twice daily) PO BID.     Follow-up   Next level will take place in 1 month    Electronically signed by:  Alben Spittle, PharmD, BCACP, CPP  Clinical Pharmacist Practitioner  [facility]  [phone]

## 2021-07-29 NOTE — Unmapped (Signed)
Routed the most recent note to [employer] per request for oxygen renewal.

## 2021-07-31 ENCOUNTER — Institutional Professional Consult (permissible substitution)
Admit: 2021-07-31 | Discharge: 2021-08-01 | Payer: PRIVATE HEALTH INSURANCE | Attending: Diagnostic Radiology | Primary: Diagnostic Radiology

## 2021-07-31 DIAGNOSIS — R042 Hemoptysis: Principal | ICD-10-CM

## 2021-07-31 NOTE — Unmapped (Signed)
[facility] - FOLLOW UP VISIT    Patient Name: Jordan [name]  Patient Age: 66 y.o.  Encounter Date: 07/31/2021    REFERRING PHYSICIAN: Braulio Conte, MD  [address] [place],  [place] [address]  PRIMARY CARE PROVIDER: Barbaraann Boys, MD      Subjective:     Chief Complaint: Follow-up visit    History of Present Illness:  Jordan [name] is a 66 y.o. male who returns to clinic today. He is s/p BAE for hemoptysis on 07/11/2021. After the procedure he had one small volume bleeding episode, but has had none since. He has no complaints or concerns at this time.     Review of Systems: Pertinent positives and negatives are noted in the HPI above. No other complaints.    Past Medical/Surgical History:  Past Medical History:   Diagnosis Date    Abscess of lung (CMS-HCC) 11/06/2010    CT Chest 10/22/10 12/05/2010 right thoracotomy resection of right middle lobe and resection of right lower lobe abscess     Arthritis     back    Biceps tendon tear 2013    left side    Bronchiectasis (CMS-HCC)     chronic psuedomonas infection    Degenerative joint disease of left acromioclavicular joint     GERD (gastroesophageal reflux disease)     Hypertension     Obstructive sleep apnea on CPAP 03/14/13    AHI 33.5, on BiPAP 12/8 based on sleep study 09/2013    Pneumonia 2012    Lung abcess     PONV (postoperative nausea and vomiting)     Prostate cancer (CMS-HCC) 09/09/2017    Rotator cuff injury left    Vertigo        Past Surgical History:   Procedure Laterality Date    APPENDECTOMY  1969    BACK SURGERY      BRONCHOSCOPY  10/19/2010    [facility]    CARPAL TUNNEL RELEASE  1999    IR EMBOLIZATION HEMORRHAGE ART OR VEN  LYMPHATIC EXTRAVASATION  07/11/2021    IR EMBOLIZATION HEMORRHAGE ART OR VEN  LYMPHATIC EXTRAVASATION 07/11/2021 Braulio Conte, MD IMG VIR [facility]&V [facility]    LUNG REMOVAL, PARTIAL Right 10/2010    RML and RLL partially resected    MENISCECTOMY  2011    NECK SURGERY  1993, 1999, 2002    PR GERD TST W/ MUCOS IMPEDE ELECTROD,>1HR N/A 07/06/2012    Procedure: ESOPHAGEAL FUNCTION TEST, GASTROESOPHAGEAL REFLUX TEST W/ NASAL CATHETER INTRALUMINAL IMPEDANCE ELECTRODE(S) PLACEMENT, RECORDING, ANALYSIS AND INTERPRETATION; PROLONGED;  Surgeon: None None;  [facility];  Service: Gastroenterology    PR OPEN TREAT RIB FRACTURE W/INT FIXATION, UNILATERAL, 1-2 RIBS Right 03/19/2015    Procedure: OPEN TREATMENT OF RIB FRACTURE REQUIRING INTERNAL FIXATION, UNILATERAL; 1-2 RIBS;  Surgeon: Evert Kohl, MD;  Location: MAIN OR [facility];  Service: Cardiothoracic    PR THORACOTOMY W/THERAP WEDGE RESEXN ADDL IPSILATRL Right 08/07/2013    Procedure: THORACOTOMY; WITH THERAPEUTIC LOBECTOMY OF RIGHT MIDDLE AND RIGHT LOWER LOBE RESECTION , EACH ADDITIONAL RESECTION, IPSILATERAL;  Surgeon: Alvester Chou, MD;  Location: MAIN OR [facility];  Service: Cardiothoracic    TONSILLECTOMY  1965       Family History:  Patient family history includes Alzheimer's disease in his mother; Asthma in his son; Bronchiectasis  in his brother, brother, and daughter; Diabetes in his father; Heart failure in his father; Liver disease in his father;  Stroke in his mother.    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     Social Determinants of Health     Financial Resource Strain: Low Risk     Difficulty of Paying Living Expenses: Not very hard   Food Insecurity: No Food Insecurity    Worried About Programme researcher, broadcasting/film/video in the Last Year: Never true    Ran Out of Food in the Last Year: Never true   Transportation Needs: No Transportation Needs    Lack of Transportation (Medical): No    Lack of Transportation (Non-Medical): No       Allergies:  Allergies   Allergen Reactions    Gentamicin Other (See Comments)     BALANCE ISSUES    Tobramycin Other (See Comments)     ototoxicity    Morphine Nausea And Vomiting       Medications:    Current Outpatient Medications:     albuterol 2.5 mg /3 mL (0.083 %) nebulizer solution, INHALE 3 ML (2.5 MG TOTAL) BY NEBULIZATION TWO (2) TIMES A DAY., Disp: 525 mL, Rfl: 3    albuterol HFA 90 mcg/actuation inhaler, Inhale 2 puffs Four (4) times a day. (Patient taking differently: Inhale 2 puffs four (4) times a day as needed.), Disp: 25.5 g, Rfl: 3    alcohol swabs (ALCOHOL PREP PADS) PadM, Use as directed with inhaled antibiotics, Disp: 100 each, Rfl: 5    alendronate (FOSAMAX) 70 MG tablet, TAKE 1 TABLET BY MOUTH EVERY 7 DAYS FOR 12 DOSES, Disp: , Rfl:     ascorbic acid, vitamin C, (VITAMIN C) 1000 MG tablet, Take 1,000 mg by mouth daily., Disp: , Rfl:     atorvastatin (LIPITOR) 10 MG tablet, Take 1 tablet (10 mg total) by mouth daily., Disp: , Rfl:     azithromycin (ZITHROMAX) 250 MG tablet, TAKE 1 TABLET BY MOUTH EVERY DAY, Disp: 90 tablet, Rfl: 3    budesonide-formoteroL (SYMBICORT) 80-4.5 mcg/actuation inhaler, Inhale 2 puffs Two (2) times a day., Disp: 10.2 g, Rfl: 11    calcium carbonate (CALCIUM 600 ORAL), Take 2 tablets by mouth two (2) times a day. 1 in morning and 1 in the evening, Disp: , Rfl:     cholecalciferol, vitamin D3-50 mcg, 2,000 unit,, 50 mcg (2,000 unit) cap, Take 2,000 Units by mouth daily., Disp: , Rfl:     coenzyme Q10 300 mg cap, Take 10-300 mg by mouth daily., Disp: , Rfl:     colistimethate (COLYMYCIN) 150 mg injection, Inject 2 mL sterile water for injection to mix colistin vial, then draw up 2 mL (150mg ) and inhale 2 times a day, 28 days on and 28 days off., Disp: 60 each, Rfl: 5    dornase alfa (PULMOZYME) 1 mg/mL nebulizer solution, Inhale 1 ampule (2.5 mg) daily. Use at least 30-60 minutes before airway clearance, or after airway clearance, Disp:  225 mL, Rfl: 3    empty container (SHARPS CONTAINER) Misc, USE AS DIRECTED, Disp: 1 each, Rfl: 0    empty container Misc, USE AS DIRECTED, Disp: 1 each, Rfl: 2    ferrous sulfate 325 (65 FE) MG tablet, Take 325 mg by mouth Two (2) times a day. BID, Disp: , Rfl:     HYDROcodone-acetaminophen (NORCO) 7.5-325 mg per tablet, Take 1 tablet by mouth three (3) times a day (at 6am, noon and 6pm)., Disp: , Rfl:     mirtazapine (REMERON) 15 MG tablet, Take 1 tablet (15 mg total) by mouth nightly., Disp: 90 tablet, Rfl: 3    montelukast (SINGULAIR) 10 mg tablet, TAKE 1 TABLET BY MOUTH EVERY DAY AT NIGHT, Disp: 90 tablet, Rfl: 3    multivitamin-minerals-lutein Tab, Take 1 tablet by mouth daily. , Disp: , Rfl:     nebulizers (LC PLUS) Misc, Use with inhaled medications, Disp: 1 each, Rfl: 5    needle, disp, 21 G (BD REGULAR BEVEL NEEDLES) 21 gauge x 1 1/2 Ndle, Use as directed with inhaled Colistin, Disp: 100 each, Rfl: 3    omeprazole (PRILOSEC) 20 MG capsule, Take 1 capsule (20 mg total) by mouth Two (2) times a day., Disp: , Rfl:     sodium chloride (BD POSIFLUSH NORMAL SALINE 0.9) 0.9 % injection, Inject 2mL of 0.9%NaCl into colistin vial & gently mix. After withdrawing colistin dose, add an additional 1mL of 0.9%NaCl to neb cup with the colistin dose., Disp: 180 mL, Rfl: 11    sodium chloride 10 % Nebu, Inhale 5 mL by nebulization Two (2) times a day. Discard remaining amount and use a new vial for each dose., Disp: 900 mL, Rfl: 3    sterile water Soln, Use 2mL to mix Colistin, then add additional 2mL to neb cup with 2mL of mixed colistin. Inhale twice daily 28 days on and 28 days off., Disp: 600 mL, Rfl: 5    syringe with needle (BD LUER-LOK SYRINGE) 3 mL 20 gauge x 1 1/2 Syrg, For use with inhaled antibiotic (Colistin), Disp: 60 each, Rfl: 30    syringe with needle (BD LUER-LOK SYRINGE) 3 mL 21 gauge x 1 Syrg, For use with inhaled antibiotic (Colistin), Disp: 60 each, Rfl: 5    tiotropium bromide (SPIRIVA RESPIMAT) 2.5 mcg/actuation inhalation mist, Inhale 2 puffs daily., Disp: 12 g, Rfl: 3    tobramycin, PF, ([name]) 300 mg/5 mL nebulizer solution, Inhale 5 mL (300 mg total) by nebulization every twelve (12) hours. 28 days on and 28 days off., Disp: 280 mL, Rfl: 5    voriconazole (VFEND) 200 MG tablet, Take 2 tablets (400 mg total) by mouth Two (2) times a day., Disp: 120 tablet, Rfl: 3    zinc gluconate 50 mg (7 mg elemental zinc) tablet, Take 50 mg by mouth., Disp: , Rfl:      Objective:     Physical Exam  Deferred (virtual visit)    Pertinent Laboratory Values:   Lab Results   Component Value Date    WBC 4.1 11/20/2020    HGB 11.8 (L) 11/20/2020    HCT 34.5 (L) 11/20/2020    PLT 134 (L) 11/20/2020       Lab Results   Component Value Date    NA 138 11/20/2020    K 3.4 11/20/2020    CL 106 11/20/2020    CO2 25.0 11/20/2020    BUN 18 11/20/2020    CREATININE 1.2 04/15/2021    GLU  106 11/20/2020    CALCIUM 8.3 (L) 11/20/2020    MG 1.9 11/20/2020    PHOS 3.7 01/22/2016       Lab Results   Component Value Date    BILITOT 0.5 11/17/2020    BILIDIR 0.40 04/02/2015    PROT 7.8 11/17/2020    ALBUMIN 4.2 11/17/2020    ALT 19 11/17/2020    AST 19 11/17/2020    ALKPHOS 105 11/17/2020    GGT 64 06/21/2013       Lab Results   Component Value Date    LABPROT 10.4 08/03/2013    INR 0.95 04/14/2017    APTT 32.6 07/25/2014       Assessment:   RODEL GLASPY is a 66 y.o. male who presents for follow-up after R BAE on 07/11/2021.    Plan (Medical Decision Making):   Doing well after embolization. Can follow-up with me/Dr. Garner Nash if symptoms recur.     Georgian Co, MD, PhD  Assistant Professor of Radiology  Division of Vascular and Interventional Radiology  [place] [facility]        The patient reports they are currently: at home. I spent 5 minutes on the phone with the patient on the date of service. I spent an additional 5 minutes on pre- and post-visit activities on the date of service.     The patient was physically located in [place] or a state in which I am permitted to provide care. The patient and/or parent/guardian understood that s/he may incur co-pays and cost sharing, and agreed to the telemedicine visit. The visit was reasonable and appropriate under the circumstances given the patient's presentation at the time.    The patient and/or parent/guardian has been advised of the potential risks and limitations of this mode of treatment (including, but not limited to, the absence of in-person examination) and has agreed to be treated using telemedicine. The patient's/patient's family's questions regarding telemedicine have been answered.     If the visit was completed in an ambulatory setting, the patient and/or parent/guardian has also been advised to contact their provider???s office for worsening conditions, and seek emergency medical treatment and/or call 911 if the patient deems either necessary.

## 2021-08-07 NOTE — Unmapped (Signed)
[facility] Refill Coordination Note    Specialty Medication(s) to be Shipped:   CF/Pulmonary/Asthma: -Generic inhaled tobramycin 300mg /28mL inhalation solution  Other medication(s) to be shipped: LC Plus neb cups & Sodium Chloride 10% neb solu    **Pt currently on Colistimethate & will start 08/21/21 on TobramycinLubertha [name], DOB: [birth year]  Phone: [phone] (home)     All above HIPAA information was verified with patient.     Was a Nurse, learning disability used for this call? No    Completed refill call assessment today to schedule patient's medication shipment from the [facility] [phone]).  All relevant notes have been reviewed.     Specialty medication(s) and dose(s) confirmed: Regimen is correct and unchanged.   Changes to medications: Jordan [name] reports no changes at this time.  Changes to insurance: No  New side effects reported not previously addressed with a pharmacist or physician: None reported  Questions for the pharmacist: No    Confirmed patient received a Conservation officer, historic buildings and a Surveyor, mining with first shipment. The patient will receive a drug information handout for each medication shipped and additional FDA Medication Guides as required.       DISEASE/MEDICATION-SPECIFIC INFORMATION        For CF patients: CF [employer] [facility] Active? No-not enrolled    SPECIALTY MEDICATION ADHERENCE     Medication Adherence    Patient reported X missed doses in the last month: 0  Specialty Medication: Colistimethate 150mg  Inj  Patient is on additional specialty medications: Yes  Additional Specialty Medications: Tobramycin PF 300mg /28ml Neb Solu   Patient Reported Additional Medication X Missed Doses in the Last Month: 0  Patient is on more than two specialty medications: No  Informant: patient  Reliability of informant: reliable  Reasons for non-adherence: no problems identified  Support network for adherence: family member          Were doses missed due to medication being on hold? No    Tobramycin 300mg /78ml neb solu: 0 days of medicine on hand (Start 08/21/21)     REFERRAL TO PHARMACIST     Referral to the pharmacist: Not needed    [facility]     Shipping address confirmed in Epic.     Delivery Scheduled: Yes, Expected medication delivery date: 08/14/2021.     Medication will be delivered via UPS to the prescription address in Epic WAM.    Jordan [name] P Wetzel [place] [facility] Specialty Technician

## 2021-08-08 NOTE — Unmapped (Signed)
Faxed prescription for [employer] to [facility]. Patient notified via MyChart.

## 2021-08-13 MED FILL — TOBRAMYCIN 300 MG/5 ML IN 0.225 % SODIUM CHLORIDE FOR NEBULIZATION: RESPIRATORY_TRACT | 56 days supply | Qty: 280 | Fill #4

## 2021-08-13 MED FILL — LC PLUS MISC: 30 days supply | Qty: 1 | Fill #1

## 2021-08-13 MED FILL — SODIUM CHLORIDE 10 % FOR NEBULIZATION: RESPIRATORY_TRACT | 30 days supply | Qty: 900 | Fill #1

## 2021-08-15 DIAGNOSIS — J471 Bronchiectasis with (acute) exacerbation: Secondary | ICD-10-CM | POA: Diagnosis not present

## 2021-08-22 DIAGNOSIS — G4733 Obstructive sleep apnea (adult) (pediatric): Secondary | ICD-10-CM | POA: Diagnosis not present

## 2021-08-22 DIAGNOSIS — J471 Bronchiectasis with (acute) exacerbation: Secondary | ICD-10-CM | POA: Diagnosis not present

## 2021-08-22 DIAGNOSIS — J479 Bronchiectasis, uncomplicated: Secondary | ICD-10-CM | POA: Diagnosis not present

## 2021-09-03 ENCOUNTER — Other Ambulatory Visit (HOSPITAL_COMMUNITY)
Admission: RE | Admit: 2021-09-03 | Discharge: 2021-09-03 | Disposition: A | Discharge status: Home / Self Care | Payer: PPO | Source: Other Acute Inpatient Hospital | Attending: Pharmacist | Admitting: Pharmacist

## 2021-09-03 DIAGNOSIS — B441 Other pulmonary aspergillosis: Secondary | ICD-10-CM | POA: Insufficient documentation

## 2021-09-04 LAB — VORICONAZOLE, SERUM: Voriconazole, Serum: 2.1 ug/mL

## 2021-09-09 DIAGNOSIS — J479 Bronchiectasis, uncomplicated: Principal | ICD-10-CM

## 2021-09-09 DIAGNOSIS — A498 Other bacterial infections of unspecified site: Principal | ICD-10-CM

## 2021-09-09 LAB — MISC LABCORP TEST (SEND OUT): Labcorp test code: 9985

## 2021-09-09 MED ORDER — ALCOHOL SWABS
5 refills | 0 days | Status: CP
Start: 2021-09-09 — End: 2022-09-09
  Filled 2021-09-11: qty 100, 50d supply, fill #0

## 2021-09-09 NOTE — Unmapped (Signed)
Jordan [name] Shared [name] Clinical Assessment & Refill Coordination Note    Jordan [name], DOB: 08/29/1955  Phone: [phone] (home)     All above HIPAA information was verified with patient.     Was a Nurse, learning disability used for this call? No    Specialty Medication(s):   CF/Pulmonary/Asthma: -Generic inhaled tobramycin 300mg /90mL inhalation solution  -Nebulized colistin (150mg  vials) and supply kit     Current Outpatient Medications   Medication Sig Dispense Refill    albuterol 2.5 mg /3 mL (0.083 %) nebulizer solution INHALE 3 ML (2.5 MG TOTAL) BY NEBULIZATION TWO (2) TIMES A DAY. 525 mL 3    albuterol HFA 90 mcg/actuation inhaler Inhale 2 puffs Four (4) times a day. (Patient taking differently: Inhale 2 puffs four (4) times a day as needed.) 25.5 g 3    alcohol swabs (ALCOHOL PREP PADS) PadM Use as directed with inhaled antibiotics 100 each 5    alendronate (FOSAMAX) 70 MG tablet TAKE 1 TABLET BY MOUTH EVERY 7 DAYS FOR 12 DOSES      ascorbic acid, vitamin C, (VITAMIN C) 1000 MG tablet Take 1,000 mg by mouth daily.      atorvastatin (LIPITOR) 10 MG tablet Take 1 tablet (10 mg total) by mouth daily.      azithromycin (ZITHROMAX) 250 MG tablet TAKE 1 TABLET BY MOUTH EVERY DAY 90 tablet 3    budesonide-formoteroL (SYMBICORT) 80-4.5 mcg/actuation inhaler Inhale 2 puffs Two (2) times a day. 10.2 g 11    calcium carbonate (CALCIUM 600 ORAL) Take 2 tablets by mouth two (2) times a day. 1 in morning and 1 in the evening      cholecalciferol, vitamin D3-50 mcg, 2,000 unit,, 50 mcg (2,000 unit) cap Take 2,000 Units by mouth daily.      coenzyme Q10 300 mg cap Take 10-300 mg by mouth daily.      colistimethate (COLYMYCIN) 150 mg injection Inject 2 mL sterile water for injection to mix colistin vial, then draw up 2 mL (150mg ) and inhale 2 times a day, 28 days on and 28 days off. 60 each 5    dornase alfa (PULMOZYME) 1 mg/mL nebulizer solution Inhale 1 ampule (2.5 mg) daily. Use at least 30-60 minutes before airway clearance, or after airway clearance 225 mL 3    empty container (SHARPS CONTAINER) Misc USE AS DIRECTED 1 each 0    empty container Misc USE AS DIRECTED 1 each 2    ferrous sulfate 325 (65 FE) MG tablet Take 325 mg by mouth Two (2) times a day. BID      HYDROcodone-acetaminophen (NORCO) 7.5-325 mg per tablet Take 1 tablet by mouth three (3) times a day (at 6am, noon and 6pm).      mirtazapine (REMERON) 15 MG tablet Take 1 tablet (15 mg total) by mouth nightly. 90 tablet 3    montelukast (SINGULAIR) 10 mg tablet TAKE 1 TABLET BY MOUTH EVERY DAY AT NIGHT 90 tablet 3    multivitamin-minerals-lutein Tab Take 1 tablet by mouth daily.       nebulizers (LC PLUS) Misc Use with inhaled medications 1 each 5    needle, disp, 21 G (BD REGULAR BEVEL NEEDLES) 21 gauge x 1 1/2 Ndle Use as directed with inhaled Colistin 100 each 3    omeprazole (PRILOSEC) 20 MG capsule Take 1 capsule (20 mg total) by mouth Two (2) times a day.      sodium chloride (BD POSIFLUSH NORMAL SALINE 0.9) 0.9 %  injection Inject 2mL of 0.9%NaCl into colistin vial & gently mix. After withdrawing colistin dose, add an additional 1mL of 0.9%NaCl to neb cup with the colistin dose. 180 mL 11    sodium chloride 10 % Nebu Measure and Inhale 5 mL by nebulization Two (2) times a day. Discard remaining amount and use a new vial for each dose. 900 mL 3    sterile water Soln Use 2mL to mix Colistin, then add additional 2mL to neb cup with 2mL of mixed colistin. Inhale twice daily 28 days on and 28 days off. 600 mL 5    syringe with needle (BD LUER-LOK SYRINGE) 3 mL 20 gauge x 1 1/2 Syrg For use with inhaled antibiotic (Colistin) 60 each 30    syringe with needle (BD LUER-LOK SYRINGE) 3 mL 21 gauge x 1 Syrg For use with inhaled antibiotic (Colistin) 60 each 5    tiotropium bromide (SPIRIVA RESPIMAT) 2.5 mcg/actuation inhalation mist Inhale 2 puffs daily. 12 g 3    tobramycin, PF, ([name]) 300 mg/5 mL nebulizer solution Inhale 5 mL (300 mg total) by nebulization every twelve (12) hours. 28 days on and 28 days off. 280 mL 5    voriconazole (VFEND) 200 MG tablet Take 2 tablets (400 mg total) by mouth Two (2) times a day. 120 tablet 3    zinc gluconate 50 mg (7 mg elemental zinc) tablet Take 50 mg by mouth.       No current facility-administered medications for this visit.        Changes to medications: [name] reports no changes at this time.    Allergies   Allergen Reactions    Gentamicin Other (See Comments)     BALANCE ISSUES    Tobramycin Other (See Comments)     ototoxicity    Morphine Nausea And Vomiting       Changes to allergies: No    SPECIALTY MEDICATION ADHERENCE     tobramycin 300  mg/68mL : 8 days of medicine on hand (will finish cycle on 6/28)  Colistin 150 mg: 0 days of medicine on hand     Medication Adherence    Patient reported X missed doses in the last month: 0  Specialty Medication: Colistin 150mg  BID - alternates monthly with tobi  Patient is on additional specialty medications: Yes  Additional Specialty Medications: Tobramycin 300 mg/66mL Q12h - alternates monthly with colistin  Patient Reported Additional Medication X Missed Doses in the Last Month: 0  Patient is on more than two specialty medications: No  Any gaps in refill history greater than 2 weeks in the last 3 months: no  Demonstrates understanding of importance of adherence: yes  Informant: patient  Support network for adherence: family member          Specialty medication(s) dose(s) confirmed: Regimen is correct and unchanged.     Are there any concerns with adherence? No    Adherence counseling provided? Not needed    CLINICAL MANAGEMENT AND INTERVENTION      Clinical Benefit Assessment:    Do you feel the medicine is effective or helping your condition? Yes    Clinical Benefit counseling provided? Not needed    Adverse Effects Assessment:    Are you experiencing any side effects? No    Are you experiencing difficulty administering your medicine? No    Quality of Life Assessment:    Quality of Life    Rheumatology  Oncology  Dermatology  Cystic Fibrosis  How many days over the past month did your bronchiectasis  keep you from your normal activities? For example, brushing your teeth or getting up in the morning. Mr. Kurtenbach is recovering from a recent infection but is overall doing well    Have you discussed this with your provider? Yes    Acute Infection Status:    Acute infections noted within Epic:  No active infections  Patient reported infection:  bronchitis, completed 10 day treatment of Cipro - patient reported to provider    Therapy Appropriateness:    Is therapy appropriate and patient progressing towards therapeutic goals? Yes, therapy is appropriate and should be continued    DISEASE/MEDICATION-SPECIFIC INFORMATION      N/A    PATIENT SPECIFIC NEEDS     Does the patient have any physical, cognitive, or cultural barriers? No    Is the patient high risk? No    Does the patient require a Care Management Plan? No     SOCIAL DETERMINANTS OF HEALTH     At the [name], we have learned that life circumstances - like trouble affording food, housing, utilities, or transportation can affect the health of many of our patients.   That is why we wanted to ask: are you currently experiencing any life circumstances that are negatively impacting your health and/or quality of life? No    Social Determinants of Psychologist, prison and probation services Strain: Low Risk     Difficulty of Paying Living Expenses: Not very hard   Internet Connectivity: Not on file   Food Insecurity: No Food Insecurity    Worried About Programme researcher, broadcasting/film/video in the Last Year: Never true    Ran Out of Food in the Last Year: Never true   Tobacco Use: Low Risk     Smoking Tobacco Use: Never    Smokeless Tobacco Use: Never    Passive Exposure: Not on file   Housing/Utilities: Low Risk     Within the past 12 months, have you ever stayed: outside, in a car, in a tent, in an overnight shelter, or temporarily in someone else's home (i.e. couch-surfing)?: No    Are you worried about losing your housing?: No    Within the past 12 months, have you been unable to get utilities (heat, electricity) when it was really needed?: No   Alcohol Use: Not on file   Transportation Needs: No Transportation Needs    Lack of Transportation (Medical): No    Lack of Transportation (Non-Medical): No   Substance Use: Not on file   Health Literacy: Not on file   Physical Activity: Not on file   Interpersonal Safety: Not on file   Stress: Not on file   Intimate Partner Violence: Not on file   Depression: Not on file   Social Connections: Not on file       Would you be willing to receive help with any of the needs that you have identified today? Not applicable       SHIPPING     Specialty Medication(s) to be Shipped:   CF/Pulmonary/Asthma: -Nebulized colistin (150mg  vials) and supply kit    Other medication(s) to be shipped:  Spiriva Respimat and neb cup     Changes to insurance: No    Delivery Scheduled: Yes, Expected medication delivery date: 09/12/21.     Medication will be delivered via UPS to the confirmed prescription address in [facility].    The patient will receive a drug  information handout for each medication shipped and additional FDA Medication Guides as required.  Verified that patient has previously received a Conservation officer, historic buildings and a Surveyor, mining.    The patient or caregiver noted above participated in the development of this care plan and knows that they can request review of or adjustments to the care plan at any time.      All of the patient's questions and concerns have been addressed.    Oliva Bustard   [facility] Specialty Pharmacist

## 2021-09-11 MED ORDER — BD LUER-LOK SYRINGE 3 ML 21 GAUGE X 1"
5 refills | 0 days | Status: CP
Start: 2021-09-11 — End: ?
  Filled 2021-09-11: qty 60, 30d supply, fill #0

## 2021-09-11 MED FILL — SPIRIVA RESPIMAT 2.5 MCG/ACTUATION SOLUTION FOR INHALATION: RESPIRATORY_TRACT | 90 days supply | Qty: 12 | Fill #1

## 2021-09-11 MED FILL — BD REGULAR BEVEL NEEDLES 21 GAUGE X 1 1/2": 30 days supply | Qty: 60 | Fill #3

## 2021-09-11 MED FILL — BD POSIFLUSH NORMAL SALINE 0.9 % INJECTION SYRINGE: 30 days supply | Qty: 180 | Fill #3

## 2021-09-11 MED FILL — LC PLUS MISC: 30 days supply | Qty: 1 | Fill #2

## 2021-09-11 MED FILL — COLISTIN (COLISTIMETHATE SODIUM) 150 MG SOLUTION FOR INJECTION: 30 days supply | Qty: 60 | Fill #1

## 2021-09-12 DIAGNOSIS — M7989 Other specified soft tissue disorders: Secondary | ICD-10-CM | POA: Diagnosis not present

## 2021-09-12 DIAGNOSIS — R200 Anesthesia of skin: Secondary | ICD-10-CM | POA: Diagnosis not present

## 2021-09-12 NOTE — Unmapped (Signed)
Azole Antifungal Therapeutic Monitoring Pharmacy Note    Jordan [name] is a 66 y.o. male continuing voriconazole. Last CMP and voriconazole level completed 09/03/21. See Care Everywhere.     Indication:  Treatment of aspergilloma    Prior Dosing Information: Current regimen 400mg  PO BID  - dose increase as of 05/12/21    Goals:  Therapeutic Drug Levels  Trough level: 1-45mcg/mL  >1 mcg/ml: okay for most indications  >2 mcg/ml: consider for invasive mold infections (including aspergillosis), CNS  infections, elevated MICs, or disseminated infections     Additional Clinical Monitoring/Outcomes  Monitor QTc, renal function (SCr and UOP), and liver function (LFTs)    Results:  Voriconazole level: 2.1 micrograms/mL 09/03/21    Longitudinal Dose Monitoring:  Date Dose Level  (mcg/mL) Key Drug Interactions   04/11/21 200mg  PO BID Not drawn  N/A (Symbicort dose reduced)   04/18/21 200mg  PO BID Not drawn N/A (Symbicort dose reduced)   04/25/21 200mg  PO BID 0.6 N/A (Symbicort dose reduced)   05/08/21 300mg  PO BID 0.9 N/A (Symbicort dose reduced)   05/20/21 400mg  PO BID 1.9 N/A (Symbicort dose reduced)   05/30/21 400mg  PO BID 1.8 N/A (Symbicort dose reduced)   06/25/21 400mg  PO BID 2.2 N/A (Symbicort dose reduced)   07/24/21 400mg  PO BID 1.7 N/A (Symbicort dose reduced)   09/03/21 400mg  PO BID 2.1 N/A (Symbicort dose reduced)      Wt Readings from Last 3 Encounters:   07/11/21 79.4 kg (175 lb)   07/01/21 81.2 kg (179 lb 0.2 oz)   07/01/21 81.2 kg (179 lb)     Lab Results   Component Value Date    BILITOT <0.2 09/03/2021    ALBUMIN 4.3 09/03/2021    ALT 12 09/03/2021    AST 20 09/03/2021       Lab Results   Component Value Date    CREATININE 1.19 05/20/2021    CREATININE 1.05 05/30/2021    CREATININE 1.11 06/25/2021    CREATININE 0.97 07/24/2021    CREATININE 1.01 09/03/2021       Assessment/Plan:  Recommendation(s)  Baseline QTC on 8/28/222. Repeat EKG on 06/12/21 showed QTcB of .  CMP collected on 09/03/21: LFTs WNL. Creatinine slightly increased.  No new medications identified in fill history or on medication list  Voriconazole level subtherapeutic.     Continue voriconazole to 400 mg (5 mg/kg twice daily) PO BID.     Follow-up   Next level will take place in 1 month    Electronically signed by:  Alben Spittle, PharmD, BCACP, CPP  Clinical Pharmacist Practitioner  [facility]  [phone]

## 2021-09-15 DIAGNOSIS — J479 Bronchiectasis, uncomplicated: Secondary | ICD-10-CM | POA: Diagnosis not present

## 2021-09-15 DIAGNOSIS — J449 Chronic obstructive pulmonary disease, unspecified: Secondary | ICD-10-CM | POA: Diagnosis not present

## 2021-09-15 DIAGNOSIS — G4733 Obstructive sleep apnea (adult) (pediatric): Secondary | ICD-10-CM | POA: Diagnosis not present

## 2021-09-17 NOTE — Unmapped (Signed)
Called patient to see if he had gotten a Life 2000 yet and if he had any questions.  He has had it for about a week.  He had tried to sleep with it a couple of times and didn't like doing that.  Ilet him know that we got it for him for mobile ventilation so that he could do more and not feel so SOB.  He said he does find that it helps him feel better when he is being active. No other questions at this time.

## 2021-09-19 NOTE — Unmapped (Signed)
Called patient to discuss Life 2000.  He doesn't really use it in the house only when he goes out.  It is very helpful and he is able to do more since he started using it.  No other questions at this time.

## 2021-09-21 DIAGNOSIS — J471 Bronchiectasis with (acute) exacerbation: Secondary | ICD-10-CM | POA: Diagnosis not present

## 2021-09-21 DIAGNOSIS — J479 Bronchiectasis, uncomplicated: Secondary | ICD-10-CM | POA: Diagnosis not present

## 2021-09-21 DIAGNOSIS — G4733 Obstructive sleep apnea (adult) (pediatric): Secondary | ICD-10-CM | POA: Diagnosis not present

## 2021-09-29 ENCOUNTER — Ambulatory Visit: Admit: 2021-09-29 | Discharge: 2021-09-30

## 2021-09-29 DIAGNOSIS — A498 Other bacterial infections of unspecified site: Secondary | ICD-10-CM | POA: Diagnosis not present

## 2021-09-29 DIAGNOSIS — J479 Bronchiectasis, uncomplicated: Secondary | ICD-10-CM | POA: Diagnosis not present

## 2021-10-06 ENCOUNTER — Other Ambulatory Visit (HOSPITAL_COMMUNITY)
Admission: RE | Admit: 2021-10-06 | Discharge: 2021-10-06 | Disposition: A | Discharge status: Home / Self Care | Payer: PPO | Source: Ambulatory Visit | Attending: Cardiology | Admitting: Cardiology

## 2021-10-06 DIAGNOSIS — B449 Aspergillosis, unspecified: Secondary | ICD-10-CM | POA: Diagnosis not present

## 2021-10-06 LAB — CBC WITH DIFFERENTIAL/PLATELET
Abs Immature Granulocytes: 0.08 10*3/uL — ABNORMAL HIGH (ref 0.00–0.07)
Basophils Absolute: 0 10*3/uL (ref 0.0–0.1)
Basophils Relative: 0 %
Eosinophils Absolute: 0.2 10*3/uL (ref 0.0–0.5)
Eosinophils Relative: 3 %
HCT: 45.2 % (ref 39.0–52.0)
Hemoglobin: 14.8 g/dL (ref 13.0–17.0)
Immature Granulocytes: 1 %
Lymphocytes Relative: 22 %
Lymphs Abs: 1.6 10*3/uL (ref 0.7–4.0)
MCH: 30.1 pg (ref 26.0–34.0)
MCHC: 32.7 g/dL (ref 30.0–36.0)
MCV: 91.9 fL (ref 80.0–100.0)
Monocytes Absolute: 0.5 10*3/uL (ref 0.1–1.0)
Monocytes Relative: 7 %
Neutro Abs: 4.8 10*3/uL (ref 1.7–7.7)
Neutrophils Relative %: 67 %
Platelets: 172 10*3/uL (ref 150–400)
RBC: 4.92 MIL/uL (ref 4.22–5.81)
RDW: 11.8 % (ref 11.5–15.5)
WBC: 7.2 10*3/uL (ref 4.0–10.5)
nRBC: 0 % (ref 0.0–0.2)

## 2021-10-06 NOTE — Unmapped (Signed)
[facility] Refill Coordination Note    Specialty Medication(s) to be Shipped:   CF/Pulmonary/Asthma: -Generic inhaled tobramycin 300mg /64mL inhalation solution    Other medication(s) to be shipped: No additional medications requested for fill at this time** Patient alternates tobramycin and colistimethate every other monthFRANCESCO [name], DOB: [birth year]  Phone: [phone] (home)       All above HIPAA information was verified with patient.     Was a Nurse, learning disability used for this call? No    Completed refill call assessment today to schedule patient's medication shipment from the [facility] [phone]).  All relevant notes have been reviewed.     Specialty medication(s) and dose(s) confirmed: Regimen is correct and unchanged.   Changes to medications: Jordan [name] reports no changes at this time.  Changes to insurance: No  New side effects reported not previously addressed with a pharmacist or physician: None reported  Questions for the pharmacist: No    Confirmed patient received a Conservation officer, historic buildings and a Surveyor, mining with first shipment. The patient will receive a drug information handout for each medication shipped and additional FDA Medication Guides as required.       DISEASE/MEDICATION-SPECIFIC INFORMATION        For CF patients: CF [employer] Active? No-not enrolled    SPECIALTY MEDICATION ADHERENCE     Medication Adherence    Patient reported X missed doses in the last month: 0  Specialty Medication: colistimethate 150 mg injection (COLYMYCIN)  Patient is on additional specialty medications: Yes  Additional Specialty Medications: tobramycin (PF) 300 mg/5 mL nebulizer solution ([name])  Patient Reported Additional Medication X Missed Doses in the Last Month: 0  Patient is on more than two specialty medications: No  Informant: patient  Reliability of informant: reliable  Provider-estimated medication adherence level: good  Reasons for non-adherence: adverse effects  Support network for adherence: family member              Were doses missed due to medication being on hold? No    tobramycin (PF) 300 mg/5 mL nebulizer solution ([name])  : 7-10 days of medicine on hand        REFERRAL TO PHARMACIST     Referral to the pharmacist: Not needed      [facility]     Shipping address confirmed in Epic.     Delivery Scheduled: Yes, Expected medication delivery date: 10/14/21.     Medication will be delivered via UPS to the prescription address in Epic [facility].    Jordan [name] Shared [facility] Specialty Technician

## 2021-10-08 DIAGNOSIS — L812 Freckles: Secondary | ICD-10-CM | POA: Diagnosis not present

## 2021-10-08 DIAGNOSIS — L820 Inflamed seborrheic keratosis: Secondary | ICD-10-CM | POA: Diagnosis not present

## 2021-10-08 DIAGNOSIS — Z85828 Personal history of other malignant neoplasm of skin: Secondary | ICD-10-CM | POA: Diagnosis not present

## 2021-10-08 DIAGNOSIS — L853 Xerosis cutis: Secondary | ICD-10-CM | POA: Diagnosis not present

## 2021-10-08 DIAGNOSIS — L821 Other seborrheic keratosis: Secondary | ICD-10-CM | POA: Diagnosis not present

## 2021-10-08 DIAGNOSIS — L570 Actinic keratosis: Secondary | ICD-10-CM | POA: Diagnosis not present

## 2021-10-08 DIAGNOSIS — D1801 Hemangioma of skin and subcutaneous tissue: Secondary | ICD-10-CM | POA: Diagnosis not present

## 2021-10-10 ENCOUNTER — Other Ambulatory Visit: Payer: Self-pay | Admitting: Cardiology

## 2021-10-10 DIAGNOSIS — I77810 Thoracic aortic ectasia: Secondary | ICD-10-CM

## 2021-10-10 DIAGNOSIS — I471 Supraventricular tachycardia: Secondary | ICD-10-CM

## 2021-10-10 LAB — VORICONAZOLE, SERUM: Voriconazole, Serum: <0.3 ug/mL

## 2021-10-13 DIAGNOSIS — J479 Bronchiectasis, uncomplicated: Principal | ICD-10-CM

## 2021-10-13 MED FILL — TOBRAMYCIN 300 MG/5 ML IN 0.225 % SODIUM CHLORIDE FOR NEBULIZATION: RESPIRATORY_TRACT | 56 days supply | Qty: 280 | Fill #5

## 2021-10-13 NOTE — Unmapped (Signed)
Jordan [name] Bronchiectasis/[facility]   Assessment:      Patient:Jordan [name] (11-May-1955)    Jordan [name] is a 66 y.o. male who is seen for follow up of bronchiectasis with chronic Pseudomonas colonization and chronic hypoxemic respiratory failure requiring supplemental oxygen with exertion and sleep. After admission to [facility] for worsening dyspnea despite oral antibiotics, developed hemoptysis. 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  Also found to have tortuous bronchial artery supplying the RUL; s/p BAE 07/11/21. Developed peripheral neuropathy which we thought was potentially due to the voriconazole.  However, it has not improved with cessation and he has now developed lower extremity edema.     Plan:      Problem List Items Addressed This Visit          Respiratory    Bronchiectasis (CMS-HCC)    Relevant Orders    CT Chest Wo Contrast (Completed)    Flow volume loop    Chronic respiratory failure with hypoxia (CMS-HCC)     Other Visit Diagnoses       Chronic pulmonary aspergillosis (CMS-HCC)    -  Primary    Relevant Orders    CT Chest Wo Contrast (Completed)    Long-term use of high-risk medication        Relevant Orders    Comprehensive Metabolic Panel (Completed)    Peripheral polyneuropathy        Relevant Orders    Vitamin B12 Level (Completed)    B-type Natriuretic Peptide (Completed)    Serum Protein Electrophoresis and Immunofixation (Completed)    TSH (Completed)    T4, free (Completed)    Total Protein for SPE (Completed)    Localized edema        Relevant Orders    B-type Natriuretic Peptide (Completed)    Other specified mononeuropathies of bilateral lower limbs        Relevant Orders    TSH (Completed)    Ambulatory referral to Neurology    Hereditary and idiopathic neuropathy, unspecified        Relevant Orders    T4, free (Completed)    Ambulatory referral to Neurology        Therapies:  Patient did not need to meet with the respiratory therapist today.  No changes were made to airway clearance regimen.   The following changes were made to the inhaled medications: Having him stop the colistin a little bit early.  I am concerned about the possibility of systemic absorption of the colistin; it can have neurologic side effects.  Instead, he will start his [name] nebs and we will check a level about an hour or 2 after a dose to see if he is systemically absorbing.  He will keep his voriconazole dosage at 100 mg daily for now.  I see no reason to continue tapering him since it seems less likely the voriconazole is causing his new symptoms.    Testing:  Patient was unable to expectorate sputum today to be sent for bacterial and AFB cultures.  If he has future exacerbations, sputum should be sent for bacterial and AFB cultures. The bacterial culture should be processed like a cystic fibrosis sample given the overlapping pathogens. There are standing orders for these cultures in our system.  Blood work drawn to further evaluate his peripheral neuropathy.  This included B12, serum protein electrophoresis, TSH, free T4.  BNP drawn to assess for volume overload as cause of lower extremity edema.  CMP drawn for monitoring while on voriconazole.  Repeat noncontrast chest CT to be done at [facility] to assess for interval change of his aspergilloma..  As noted above, getting random tobramycin level locally 1-2 hours after [name] neb to assess for systemic absorption.    Referrals:  Referred to neurology at [facility] for evaluation and management of peripheral neuropathy.  I am reaching out to our speech therapist regarding utility of additional therapy to strengthen his swallowing.    Vaccinations:  COVID-19: Up to date  Flu: Up to date  Pneumococcal: Up to date  Tdap: Up to date    Jordan [name] will return to clinic in 3 months for follow-up with pre-bronchodilator spirometry and sputum cultures.  He will call or send me a message via MyChart if questions or concerns arise before this visit. Jordan [name] is in agreement with the above plan.     Subjective:      HPI: Jordan [name] is a 66 y.o. male who is seen for follow up of bronchiectasis with PsA colonization and prior history of multiple lung resections.    09/27/18:  I last saw him on 05/17/18. He reached out at the end of May reporting symptoms c/w an exacerbation. I treated him with a two week course of Ciprofloxacin, which seemed to help.  Reports that he feels winded too much - if walks up incline or up a couple hundred feet. This has worsened over the past year. Has to stop and let lungs catch up.  Of note, he has been off testosterone supplementation for the past year after he was diagnosed with prostate cancer.    He received the [facility], which he is using with nebulized HTS 7% and Pulmozyme. After using the Vest for 6-8 weeks, he noticed improvement with more phlegm mobilization. Sometimes has to pause it to cough to get stuff up.  Plugging feeling happening a lot more, especially with the vest.  Most of the time, sputum is thinned out with Pulmozyme but still gets those plugs.    Had to stop eating ice cream or drinking milkshake - will cough will eating and that makes him vomit on occasion. Past few weeks, when eating will cough, no matter what he eats.  Happens almost once a day. Doesn't feel like things get stuck typically.      02/07/19:  Following his last visit, Jordan [name] reached out regarding portable oxygen.  We had considered the life 2000 but he does not qualify because he does not use supplemental oxygen.  He performed the 6-minute walk test prior to today's visit but did not desaturate.  He wonders if this is because the test was done in the morning and he doesn't have breathing issues in the morning.  Typically, breathing issues occur in afternoon.  During these episodes, he will have to use his inhaler and feels like he is suffocating.  He describes it as an uncomfortable feeling that limits his ability to do things with family. If he is having trouble catching breath, oximetry is 92-93%. Has seen it in the 80s if comes in from outside and is short of breath.    For airway clearance, he is nebulizing 10% hypertonic saline and using his percussive vest for 20 minute sessions in AM and PM. Has to pause it to cough and get stuff up.  Coughing up more stuff since last visit and is waking up 1-2 times a night with cough.having coughing fits between clearance session.  What he expectorates is  less of a mucous plug and mostly stringy sputum that is yellowish to light brown in color.  No hemoptysis.  On Saturday, he had a coughing fit that nearly triggered posttussive emesis.  He only expectorated about 3-4 globs of phlegm.  Nebulizing Pulmozyme daily.  Alternates [name] and colistin every other month.  On [name] this month and will start Colistin on Dec 1.    06/06/19:  Coughing more and more sputum.  Waking up 1-2 times per night coughing. Can cough once or just keep coughing.  Sputum is yellow in color without evidence of blood.  He is not expectorating green sputum currently. On colistin this month; alternates with [name]. Went through January off all inhaled antibiotics.  Oxygen levels in the low 90s.  Uses rescue inhaler if goes up stairs.  Regularly performing airway clearance with nebulized 10% hypertonic saline twice a day, percussive vest twice a day, and Pulmozyme daily. He last received IV antibiotics in January 2019 but has been treated with oral antibiotics on 04/19/18 and 08/17/18, 03/10/19.      11/10/19:  1-2 times per week can get sharp right sided chest pain - even with nipple line. Can happen with clearance or activity or rest. Never during sleep. Following his hospitalization, he first experienced it in June. Lasts only about 10 seconds. Not reproducible. Limited activity due to dyspnea. Regularly performing airway clearance with nebulized 10% hypertonic saline twice a day, percussive vest twice a day, and Pulmozyme daily. Remains on chronic azithromycin therapy.    02/13/20:  Showed up to [facility] with oxygen saturations of 87-88% after walking from parking lot (<100 yd) and up incline without supplemental oxygen.  Using 2L continuous flow with exertion and sleep.  Had sleep study done at [facility] on RA and was told that he should be fine with just the supplemental oxygen. I do not have this report. Using oxygen when goes to football game; needs long period to recover from activity.  Going up 20 foot incline with oxygen, will have to stop and catch breath.     Wheezing more lately and endorses chest tightness. Has need to use albuterol as rescue 3-4 times per day.  Used albuterol in the car on the way here. Typically would be able to cough up large amount of mucus after PFTs but only coughed up small amount. Feels like mucus is stuck in lungs despite twice daily airway clearance with 10% HTS and Vest. Also using Pulmozyme once a day and Symbicort twice a day after clearance.  Using colistin this month. He ordered the [name] nebs to use next month even though insurance won't cover it. Feels like inhaled antibiotics definitely help.    05/10/20:   Saw [facility] for lung transplant evaluation.  Felt to be poor candidate due to prior surgeries and primary disease.  Candidacy closed at [facility]. Referral sent to [facility] on 2/15.  Based on labs and testing done there, getting Hep B vaccination series (not Hep A or Hep B immune).  Also getting DEXA that was ordered by Dr. Abigail Miyamoto.    Had PSG done at [facility] - no evidence of OSA but does have increased upper airway resistance.  He never sleeps on his back; only sleeps on side or stomach.   Fatigue is most burdensome symptom.    Regrets that he let the surgeon operate on his lung before he met me.  He completed pulmonary rehab at [facility] but they only had  him on machine for ~30 minutes and then stretching.  No strength exercises. He is planning on joining a gym based on recommendations from [facility] transplant.  Was surprised that he didn't desaturate during his walk test at [facility].    07/02/20:  At last visit, referred him to [facility] for pulmonary rehab. Found to have osteoporosis in hips. Turned down for transplant at [facility] based on chart review.  No change in breathing or cough.  Every once in a while, has bad coughing spells.  Same sputum volume and color. Globby to stringy. Not dark brown or bloody.  Sputum is light brown to yellowish.  Breathing about the same.  Started on 1200 mg calcium for osteoporosis.  Starting telehealth pulmonary rehab - feels optimistic that it will help him do more.    10/31/20:  Heart monitor for pulm rehab - had 40 events of fast rate or afib.  Has felt heart fluttering.  Did event monitor for 8 days.  Getting echocardiogram on Wednesday and then seeing cardiologist the next week.     Was having sharp pains over right abdomen below ribs.  Once a month, now once every 2 weeks.    03/18/21:  Since his last clinic visit, he was evaluated by cardiology at [facility] (October) and then was admitted to [facility] earlier this month due to hemoptysis that occurred while on ciprofloxacin for an exacerbation.  He also had worsening shortness of breath.  Was discharged home on 21 days of meropenem.  Did not undergo embolization or bronchoscopy.     Hasn't felt good since August.  No energy, lost weight. End Nov, asked for Cipro and for few days felt better. Then didn't feel better and was ordeal to see PCP.  WBC elevated in ER - they called me. Friday night/Saturday started seeing dark blood in sputum.  Monday night 8PM, got breathing treatment with bright red globs.  Large volume blood and phlegm. They were going to do bronchoscopy but then bleeding improved with change in abx, holding 10% saline. Was continued on percussive vest and Pulmozyme. Friday to Saturday, wine colored.  Then pinkish. Sunday night could barely see blood and then gone Monday.    Finished Meropenem last night (3 weeks total). Christmas eve morning, started seeing bright red streaks. Yesterday, coughed up bright red blood that was 50/50 with phlegm.  Today more wine colored.   Shortness of breath better with meropenem but no change in hemoptysis.    07/01/21:  After last visit, we attempted to perform bronchoscopy but due to hypoxemia, we canceled the procedure.  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  He also had a visit with Dr. Erven Colla in interventional radiology and is scheduled for bronchial artery embolization later this month.    Feels off balance but not dizzy/spinning.  This has been intensifying with increasing the voriconazole dose. Happening daily every time he stands up. No hearing changes. Head feels heavy.  No vision changes nor double visions. Sinuses up and down, bleed a little bit.  Some post-nasal drip.     10/14/21:  At last visit, pursued Life2000 for his work of breathing. Started mirtazapine for appetite, mood, and sleep. Underwent BAE on 07/11/21. Tapering off voriconazole due to development of peripheral neuropathy and lower extremity swelling.  Had been on voriconazole for about 5 months.    Swelling started slightly in April and  has progressed. Neuropathy started in tips of toes a few weeks ago but now at ankles.  More shortness of breath when laying flat.  Has woken up at night short of breath.  Work of breathing outside is better on L2K.    Respiratory Symptoms:   Cough: more bad coughing spells - can be when doing breathing treatments, eating, or just sitting. Sputum brown and slightly darker.  No green. Sunday had a couple of times of thin coat of bright red blood covering sputum.  Not feeling more congested in chest.  Nocturnal awakenings: increased from last visit.    Wheezing: absent.  Chest tightness: absent.  Rescue albuterol use: Not in months.  Pleurisy: sporadic once every other day over prior resection.  Hemoptysis: as above.  MMRC Stage: 3 = I stop for breath after walking about 100 yards or after a few minutes on level ground. Walks to bathroom on room air and sats drop to mid 80s but recover quickly.  Dyspnea unrelieved despite wearing oxygen; limiting his activity.     Exacerbations:   Number of exacerbations treated in the past year: 3  Dates of exacerbations: 03/2018, 07/2018, 02/2019, 09/2019, 12/2019, 10/2020, 01/2021, 07/2021  Number of hospitalizations for exacerbations in the past 2 years: 3  Dates of hospitalizations: 01/2015, 03/2015, 01/2016, 03/2017, 09/2019, 10/2020, 02/2021    Pulmonary Therapies:   Airway clearance: Pulmozyme daily, HTS 10% bid, percussive vest bid.   Inhaled antibiotics: Alternates [name] and Colistin.- on Colistin currently.  Inhalers: Symbicort 160/4.5, Spiriva Respimat  Chronic antibiotics: azithromycin 250 mg daily. Down to voriconazole 100 mg (04/04/21-present)  Exercise: Was walking 3 miles at 2.1 without stopping on the treadmill.  Pulmonary Rehab: Telehealth pulmonary rehab July 2022  Supplemental oxygen: 2L continuous when out of house and with sleep. Using L2K and finds it helpful. ometimes bumps it up due to dyspnea, not desaturations.  NIPPV: No    ROS: Lost appetite and it hasn't come back. Having trouble sleeping and mood is down. No F/C/NS.     Past Medical History:   Diagnosis Date    Abscess of lung (CMS-HCC) 11/06/2010    CT Chest 10/22/10 12/05/2010 right thoracotomy resection of right middle lobe and resection of right lower lobe abscess     Arthritis     back    Biceps tendon tear 2013    left side    Bronchiectasis (CMS-HCC)     chronic psuedomonas infection    Degenerative joint disease of left acromioclavicular joint     GERD (gastroesophageal reflux disease)     Hypertension     Obstructive sleep apnea on CPAP 03/14/13    AHI 33.5, on BiPAP 12/8 based on sleep study 09/2013    Pneumonia 2012    Lung abcess     PONV (postoperative nausea and vomiting)     Prostate cancer (CMS-HCC) 09/09/2017    Rotator cuff injury left    Vertigo        Past Surgical History:   Procedure Laterality Date    APPENDECTOMY  1969    BACK SURGERY      BRONCHOSCOPY  10/19/2010    [facility]    CARPAL TUNNEL RELEASE  1999    IR EMBOLIZATION HEMORRHAGE ART OR VEN  LYMPHATIC EXTRAVASATION  07/11/2021    IR EMBOLIZATION HEMORRHAGE ART OR VEN  LYMPHATIC EXTRAVASATION 07/11/2021 Braulio Conte, MD IMG VIR H&V [facility]    LUNG REMOVAL, PARTIAL Right 10/2010    RML and  RLL partially resected    MENISCECTOMY  2011    NECK SURGERY  1993, 1999, 2002    PR GERD TST W/ MUCOS IMPEDE ELECTROD,>1HR N/A 07/06/2012    Procedure: ESOPHAGEAL FUNCTION TEST, GASTROESOPHAGEAL REFLUX TEST W/ NASAL CATHETER INTRALUMINAL IMPEDANCE ELECTRODE(S) PLACEMENT, RECORDING, ANALYSIS AND INTERPRETATION; PROLONGED;  Surgeon: None None;  Location: [facility];  Service: Gastroenterology    PR OPEN TREAT RIB FRACTURE W/INT FIXATION, UNILATERAL, 1-2 RIBS Right 03/19/2015    Procedure: OPEN TREATMENT OF RIB FRACTURE REQUIRING INTERNAL FIXATION, UNILATERAL; 1-2 RIBS;  Surgeon: Evert Kohl, MD;  Location: MAIN OR [place];  Service: Cardiothoracic    PR THORACOTOMY W/THERAP WEDGE RESEXN ADDL IPSILATRL Right 08/07/2013    Procedure: THORACOTOMY; WITH THERAPEUTIC LOBECTOMY OF RIGHT MIDDLE AND RIGHT LOWER LOBE RESECTION , EACH ADDITIONAL RESECTION, IPSILATERAL;  Surgeon: Alvester Chou, MD;  Location: MAIN OR [place];  Service: Cardiothoracic    TONSILLECTOMY  1965       Family History   Problem Relation Age of Onset    Alzheimer's disease Mother     Stroke Mother     Bronchiectasis  Brother         presumed    Bronchiectasis  Brother         presumed    Liver disease Father     Diabetes Father     Heart failure Father     Bronchiectasis  Daughter     Asthma Son         childhood asthma    Clotting disorder Neg Hx     Anesthesia problems Neg Hx        Social History Tobacco Use    Smoking status: Never    Smokeless tobacco: Never   Substance Use Topics    Alcohol use: No     Alcohol/week: 0.0 standard drinks    Drug use: No       Allergies  Reviewed on 10/06/2021        Reactions Comments    Gentamicin Other (See Comments) BALANCE ISSUES    Tobramycin Other (See Comments) ototoxicity    Morphine Nausea And Vomiting           Current Outpatient Medications   Medication Sig Dispense Refill    albuterol 2.5 mg /3 mL (0.083 %) nebulizer solution INHALE 3 ML (2.5 MG TOTAL) BY NEBULIZATION TWO (2) TIMES A DAY. 525 mL 3    albuterol HFA 90 mcg/actuation inhaler Inhale 2 puffs Four (4) times a day. (Patient taking differently: Inhale 2 puffs four (4) times a day as needed.) 25.5 g 3    alcohol swabs (ALCOHOL PREP PADS) PadM Use as directed with inhaled antibiotics 100 each 5    alendronate (FOSAMAX) 70 MG tablet TAKE 1 TABLET BY MOUTH EVERY 7 DAYS FOR 12 DOSES      ascorbic acid, vitamin C, (VITAMIN C) 1000 MG tablet Take 1,000 mg by mouth daily.      atorvastatin (LIPITOR) 10 MG tablet Take 1 tablet (10 mg total) by mouth daily.      azithromycin (ZITHROMAX) 250 MG tablet TAKE 1 TABLET BY MOUTH EVERY DAY 90 tablet 3    budesonide-formoteroL (SYMBICORT) 80-4.5 mcg/actuation inhaler Inhale 2 puffs Two (2) times a day. 10.2 g 11    calcium carbonate (CALCIUM 600 ORAL) Take 2 tablets by mouth two (2) times a day. 1 in morning and 1 in the evening      cholecalciferol, vitamin D3-50 mcg, 2,000 unit,,  50 mcg (2,000 unit) cap Take 2,000 Units by mouth daily.      coenzyme Q10 300 mg cap Take 10-300 mg by mouth daily.      colistimethate (COLYMYCIN) 150 mg injection Inject 2 mL sterile water for injection to mix colistin vial, then draw up 2 mL (150mg ) and inhale 2 times a day, 28 days on and 28 days off. 60 each 5    dornase alfa (PULMOZYME) 1 mg/mL nebulizer solution Inhale 1 ampule (2.5 mg) daily. Use at least 30-60 minutes before airway clearance, or after airway clearance 225 mL 3 empty container (SHARPS CONTAINER) Misc USE AS DIRECTED 1 each 0    empty container Misc USE AS DIRECTED 1 each 2    ferrous sulfate 325 (65 FE) MG tablet Take 325 mg by mouth Two (2) times a day. BID      HYDROcodone-acetaminophen (NORCO) 7.5-325 mg per tablet Take 1 tablet by mouth three (3) times a day (at 6am, noon and 6pm).      mirtazapine (REMERON) 15 MG tablet Take 1 tablet (15 mg total) by mouth nightly. 90 tablet 3    montelukast (SINGULAIR) 10 mg tablet TAKE 1 TABLET BY MOUTH EVERY DAY AT NIGHT 90 tablet 3    multivitamin-minerals-lutein Tab Take 1 tablet by mouth daily.       nebulizers (LC PLUS) Misc Use with inhaled medications 1 each 5    needle, disp, 21 G (BD REGULAR BEVEL NEEDLES) 21 gauge x 1 1/2 Ndle Use as directed with inhaled Colistin 100 each 3    omeprazole (PRILOSEC) 20 MG capsule Take 1 capsule (20 mg total) by mouth Two (2) times a day.      sodium chloride (BD POSIFLUSH NORMAL SALINE 0.9) 0.9 % injection Inject 2mL of 0.9%NaCl into colistin vial & gently mix. After withdrawing colistin dose, add an additional 1mL of 0.9%NaCl to neb cup with the colistin dose. 180 mL 11    sodium chloride 10 % Nebu Measure and Inhale 5 mL by nebulization Two (2) times a day. Discard remaining amount and use a new vial for each dose. 900 mL 3    sterile water Soln Use 2mL to mix Colistin, then add additional 2mL to neb cup with 2mL of mixed colistin. Inhale twice daily 28 days on and 28 days off. 600 mL 5    syringe with needle (BD LUER-LOK SYRINGE) 3 mL 20 gauge x 1 1/2 Syrg For use with inhaled antibiotic (Colistin) 60 each 30    syringe with needle (BD LUER-LOK SYRINGE) 3 mL 21 gauge x 1 Syrg For use with inhaled antibiotic (Colistin) 60 each 5    tiotropium bromide (SPIRIVA RESPIMAT) 2.5 mcg/actuation inhalation mist Inhale 2 puffs daily. 12 g 3    tobramycin, PF, ([name]) 300 mg/5 mL nebulizer solution Inhale 5 mL (300 mg total) by nebulization every twelve (12) hours. 28 days on and 28 days off. 280 mL 5    voriconazole (VFEND) 200 MG tablet Take 2 tablets (400 mg total) by mouth Two (2) times a day. 120 tablet 3    zinc gluconate 50 mg (7 mg elemental zinc) tablet Take 50 mg by mouth.       No current facility-administered medications for this visit.     Physical Exam:   BP 125/61  - Pulse 97  - Temp 36.9 ??C (98.5 ??F) (Temporal)  - Resp 18  - Ht 174 cm (5' 8.5)  - Wt 78.9 kg (174 lb)  -  SpO2 91% Comment: patient on special portable air - BMI 26.07 kg/m??   Fatigued appearing white male in no acute distress. Easy work of breathing without accessory muscle use. Speaking in full sentences.  Diminished air entry over RUL as compared to LUL posteriorly.  Crackles over entire left lung field.  Regular rate and rhythm with normal S1 and S2.  No murmurs, rubs, or gallops.  Clubbing unchanged from prior. 2+ pitting edema of bilateral lower extremities to at least mid-shin.    Diagnostic Review:   The following data were reviewed during this visit with key findings summarized below:  Pulmonary Function Testing:      Spirometry consistent with severe airway obstruction and suggestive of moderate restriction.  No significant change from prior.  Normal inspiratory loop.    FVC (% predicted) FEV1 (% predicted) FEV1/FVC   11/10/2013 3.19 L (68%) 2.16 L (61%)  68%   02/23/2014 3.50 L (76%)  2.23 L (64%)  64%   07/12/2014  2.94 L (66%)  2.02 L (58%)  69%   01/15/2015  2.82 L (62%)  1.79 L (52%)  63%   02/08/2015  3.04 L (66%)  2.06 L (59%)  68%   04/02/2015  2.86 L (63%)  1.83 L (53%)  64%   12/10/2015  2.77 L (62%)  1.62 L (48%)  58%   01/27/2016  2.42 L (54%)  1.55 L (46%)  64%   03/24/2016  3.36 L (75%)  1.99 L (59%)  59%   07/21/2016  2.56 L (57%)  1.54 L (46%)  60%   12/29/2016  2.80 L (63%)  1.57 L (47%)  56%   04/06/2017  2.61 L (59%)  1.46 L (44%)  56%   07/06/2017  2.56 L (58%)  1.49 L (45%)  58%   10/12/2017  2.63 L (59%)  1.47 L (44%)  56%   01/18/2018  2.68 L (63%)  1.36 L (42%)  51%   05/17/2018 2.49 L (57%)  1.25 L (38%)  50% 09/26/2018 2.43 L (57%)  1.26 L (39%)  52%   01/26/2019 2.16 L (51%)  1.16 L (36%)  54%   06/06/2019 2.47 L (59%) 1.11 L (34%) 45%   11/10/19 2.14 L (50%) 1.20 L (37%) 56%   02/13/20 2.01 L (48%) 1.08 L (34%) 54%   05/02/20 2.29 L (54%) 1.11 L (34%) 48%   07/02/20 (pre) 2.11 L (51%) 1.17 L (37%) 56%   07/02/20 (post) 2.26 L (54%) [+7.2%] 1.22 L (38%) [+4.0%] 54%   10/31/20 2.46 L (59%) 1.17 L (37%) 48%   07/01/21 2.33 L (56%) 1.13 L (36%) 48%   09/29/21 1.92 L (47%) 1.01 L (32%) 53%     6-minute Walk (12/06/19): Heart Rate: Resting Heart Rate was 71 bpm. Maximum Heart Rate achieved 97 bpm. Patient did reach target heart rate (85-132 bpm).  Heart rate returned to 77 bpm after 2 minutes of recovery.  Oxygen Saturation: Resting O2 Saturation was 94% on RA. O2 saturation nadir was 87% at minute 2. O2 saturation improved to 96% with 2L via [place] at minute 3 and maintained saturations above 88% for remainder of walk. O2 saturation returned to 96% on room air after 2 minutes of recovery.  Distance: Total 6 minute walk distance was 320 m. Predicted 6 minute walk distance 548.9 m (LLN of 395.8 m). Percent of predicted 6 minute walk distance of 58%. Testing included one 5-minute pause. Distance obtained after placed on 2L and walked  for an additional 6 minutes.  BORG: Resting BORG score was 1. Maximum BORG score was 5 at minute 2. Concluding BORG score was 0 after 2 minutes of recovery. Impression: Significant exercise induced oxygen desaturation (below 88%). Exertional hypoxemia improved with 2L supplemental oxygen. Walk distance is less than expected suggesting a cardio-respiratory defect -- clinical correlation suggested.  Recommendation: Supplemental oxygen required with exercise at 2L . Consider further evaluation with spirometry, lung volumes, respiratory muscle flow/force measurements, cardiopulmonary stress testing, and/or cardio/vascular studies.    6-Minute Walk (02/15/19): Resting Heart Rate was 91. Maximum Heart Rate achieved 119. Patient did reach target heart rate (86-133). Resting O2 Saturation was 98% on RA. Resting BORG score was 1. O2 saturation nadir was 91% at minute 6. Total 6 minute walk distance was 381 meters. Predicted 6 minute walk distance of 558 m (LLN of 493m). Percent of predicted 6 minute walk distance of 68%. Heart rate returned to 96 bpm after 2 minutes of recovery. O2 saturation returned to 96% on RA after 2 minutes of recovery. Maximum BORG score was 7 at minute 6. Concluding BORG score was 2 after 2 minutes of recovery. No evidence of resting or exercise induced hypoxemia. No supplemental oxygen is required at this time.    6-Minute Walk (01/26/19): Resting Heart Rate was 86. Maximum Heart Rate achieved 97. Patient did reach target heart rate (86-133). Resting O2 Saturation was 99% on RA. Resting BORG score was 1. O2 saturation nadir was 95% at minutes 3 and 6. Total 6 minute walk distance was 350 meters. Predicted 6 minute walk distance of 558 m (LLN of 484m). Percent of predicted 6 minute walk distance of 63%. Heart rate returned to 86 bpm after 2 minutes of recovery. O2 saturation returned to 96% on RA after 2 /minutes of recovery. Maximum BORG score was 6 at minutes 5 and 6. Concluding BORG score was 1 after 2 minutes of recovery. No evidence of resting or exercise induced hypoxemia. No supplemental oxygen is required at this time.    Cultures:    Source Bacterial Culture AFB Smear AFB Culture   04/02/15 Sputum 2+ OPF; 1+ Probably mPsA Negative Negative   07/02/15 Sputum 4+ OPF; 3+ mPsA; 3+ sPsA; 1+ Steno Negative Negative   01/23/16 Sputum 2+ OPF; 1+ mPsA; 1+ sPsA - -   03/24/16 Sputum 3+ OPF; 3+ mPsA; 3+ sPsA Negative Overgrown   07/21/16 Sputum 4+ OPF; 4+ mPsA; 2+ sPsA Negative Negative   12/29/16 Sputum 4+ OPF; 3+ mPsA; 3+ sPsA Negative Actinomadura sp   04/06/17 Sputum 3+ OPF; 4+ mPsA Negative Negative   07/06/17 Sputum 4+ OPF; 3+ mPsA Negative Negative   10/12/17 Sputum 4+ OPF; 3+ mPsA Negative Negative   01/18/18 Sputum OPF Negative Negative   05/17/18 Sputum 4+ OPF; 3+ mPsA - -   09/26/18 Sputum 4+ OPF; 3+ mPsA Negative Negative   01/26/19 Sputum 4+ OPF; 2+ mPsA Negative Negative   02/15/19 Sputum 3+ OPF; 3+ mPsA Negative Negative   06/06/19 Sputum 3+ OPF; 3+ sPsA; 3+ mPsA Negative Negative   11/10/19 Sputum 2+ OPF; 3+ mPsA Negative Negative   02/13/20 Sputum OPF Negative Negative   07/02/20 Sputum 3+ OPF; 1+sPsA; 3+ mPsA negative negative   10/31/20 Sputum 4+ OPF; 2+ sPsA; 4+ mPsA negative negative   11/17/20 Sputum 3+ OPF; 3+ mPsA negative negative   02/21/21 Sputum Few PsA negative negative   02/27/21 Sputum Scant C albicans and light A fumigatus - -   03/18/21 Sputum 2+ OPF;  1+ mPsA negative negative   03/26/21 Sputum 4+ OPF; 3+ mPsA negative negative   09/29/21 Sputum 4+ OPF; 4+ sPsA; 4+ mPsA negative NGTD      Aspergillus Ag BAL/serum (04/02/21): 0.55 (@ [facility])     Bronchiectasis Evaluation   IgG with subclasses:   Lab Results   Component Value Date/Time    IGGT 996 04/28/2012 06:21 AM    IGG1 402 04/28/2012 06:21 AM    IGG2 399 04/28/2012 06:21 AM    IGG3 148.0 (H) 04/28/2012 06:21 AM    IGG4 47.7 04/28/2012 06:21 AM     IgA (09/21/11): 283   IgM (09/21/11): 52   IgE:   Lab Results   Component Value Date/Time    IGE 16.6 02/13/2020 10:24 AM     Specific Titers: No results found for: TETANUSAB, TETGV, DIPHTERIAAB, DIPGV, S. pneumonia titers  HIV (05/02/20): Non-reactive  Autoimmune (05/02/20): ANA negative   ANCA: No results found for: ANCA, IFA, PR3ELISA, PR3QT, MPO, MPOQT  CBC-D (05/02/20): 6.1>14.8/44.3<156; ANC 4.1; ALC 1.2; AEC 0.17  Lab Results   Component Value Date/Time    WBC 4.1 11/20/2020 04:03 AM    WBC 6.1 11/19/2020 03:39 AM    WBC 7.7 11/18/2020 03:29 AM    WBC 7.9 08/13/2013 03:57 AM    WBC 9.4 08/12/2013 04:21 AM    WBC 11.7 (H) 08/11/2013 04:04 AM    HGB 11.8 (L) 11/20/2020 04:03 AM    HGB 14.8 03/24/2015 05:31 PM    HCT 34.5 (L) 11/20/2020 04:03 AM    HCT 30.8 (L) 08/13/2013 03:57 AM    PLT 134 (L) 11/20/2020 04:03 AM    PLT 292 08/13/2013 03:57 AM    NEUTROABS 7.8 11/17/2020 02:31 PM    NEUTROABS 3.7 02/13/2020 10:24 AM    NEUTROABS 3.2 04/19/2017 05:45 AM    NEUTROABS 4.9 06/21/2013 09:31 PM    NEUTROABS 4.6 06/21/2013 08:13 PM    NEUTROABS 4.1 04/26/2012 11:54 PM    LYMPHSABS 1.4 11/17/2020 02:31 PM    LYMPHSABS 1.4 02/13/2020 10:24 AM    LYMPHSABS 1.4 (L) 04/19/2017 05:45 AM    LYMPHSABS 1.9 06/21/2013 09:31 PM    LYMPHSABS 1.8 06/21/2013 08:13 PM    LYMPHSABS 2.8 04/26/2012 11:54 PM    EOSABS 0.2 11/17/2020 02:31 PM    EOSABS 0.2 02/13/2020 10:24 AM    EOSABS 0.2 04/19/2017 05:45 AM    EOSABS 0.2 06/21/2013 09:31 PM    EOSABS 0.2 06/21/2013 08:13 PM    EOSABS 0.3 04/26/2012 11:54 PM    BASOSABS 0.0 11/17/2020 02:31 PM    BASOSABS 0.0 06/21/2013 09:31 PM     Alpha-1 level: No results found for: A1TRYP  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      IgE (05/23/18):               Imaging  CTA Chest (04/15/21): Images personally reviewed. Sequelae of right middle lobectomy and right lower lobe wedge resection. Chronic consolidation in the medial posterior right upper lobe and right lung apex with volume loss and architectural distortion of the right upper lobe. Compensatory hyperexpansion of the left lung with mosaic attenuation  consistent with areas of air trapping. Extensive chronic bilateral bronchiectasis and bronchial thickening. Unchanged diffuse bilateral tree-in-bud nodularity.  Mildly enlarged and tortuous right bronchial artery, predominantly supplying the right upper lung, arising from the lateral descending aorta at the level of T6 (7:58, 4:38). No enlarged left-sided bronchial arteries. No CT evidence of intrapulmonary arteriovenous malformation.     Chest CT (02/25/21): Images personally reviewed.  Stable sequelae of right middle lobectomy and right lower lobe wedge resection. 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 Stable bilateral diffuse extensive bronchiectasis, bronchial wall thickening, and sequelae of chronic endobronchial infection.        CXR (05/02/20): Images not available for review. Per report, Visualized cardiac and mediastinal contours within normal limits allowing for mediastinal shift to the right. Circumferential right pleural fluid/thickening. Volume loss in the right lung consistent with known right lung resection. Irregular opacities throughout both lungs, right greater than left, consistent with known bronchiectasis.     Chest CT (11/10/19): Images personally reviewed. Stable sequelae of right middle lobectomy and right lower lobe wedge resection. Stable consolidation in the medial posterior right upper lobe and right lung apex, bilateral diffuse extensive bronchiectasis and bronchial wall thickening and unchanged sequelae of chronic endobronchial infection.    Chest CT (02/15/19): Images personally reviewed. Stable sequelae of right middle lobectomy and right lower lobe wedge resection.  Unchanged diffuse extensive bronchiectasis and bronchial wall thickening bilaterally.  Diffusely scattered tree-in-bud nodules in bilateral lung parenchyma with interval increase in tree-in-bud nodules in left lung; the constellation of these findings are consistent with sequelae of worsening chronic endobronchial infection.    Chest CT (03/31/16): Images personally reviewed. Status post right middle and right lower lobectomies. Tiny loculated basilar right hydropneumothorax, probably chronic. Moderate cylindrical and varicoid bronchiectasis throughout both lungs, with associated scattered mucoid impaction, tree-in-bud opacities and scattered peribronchovascular and subpleural consolidation in the right upper lobe. 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 Two-vessel coronary atherosclerosis. Nonobstructing right nephrolithiasis.    Modified Barium Swallow (01/17/19): Shallow laryngeal penetration observed with thin liquids and puree.  No aspiration identified with any of the tested consistencies.    Immunization History   Administered Date(s) Administered    COVID-19 VAC,BIVALENT(18YR UP),PFIZER 12/17/2020    COVID-19 VAC,MRNA,TRIS(12Y UP)(PFIZER)(GRAY CAP) 05/03/2020    COVID-19 VACC,MRNA,(PFIZER)(PF) 04/20/2019, 05/18/2019, 12/01/2019    HEPATITIS B VACCINE ADULT,IM(ENERGIX B, RECOMBIVAX) 05/13/2020, 06/11/2020, 11/13/2020    Hepatitis A 05/13/2020, 11/13/2020    INFLUENZA TIV (TRI) 3MO+ W/ PRESERV (IM) 11/13/2010, 01/07/2012, 12/09/2017, 11/30/2019, 12/17/2020    INFLUENZA TIV (TRI) PF (IM) 02/20/2012, 12/09/2012    Influenza Vaccine Quad (IIV4 PF) 36mo+ injectable 01/31/2016, 12/09/2017    Influenza Virus Vaccine, unspecified formulation 12/25/2014, 12/01/2016    Influenza Whole 12/21/2008    PNEUMOCOCCAL POLYSACCHARIDE 23 10/22/2010, 02/07/2016, 10/07/2019    Pneumococcal Conjugate 13-Valent 04/28/2013    TdaP 03/22/2012, 07/23/2021    ZOSTAVAX - ZOSTER VACCINE, LIVE, SQ 07/03/2014, 06/11/2017, 01/19/2018     Portions of this record have been created using [employer]. Dictation errors have been sought, but may not have been identified and corrected.

## 2021-10-13 NOTE — Unmapped (Incomplete)
Symptoms of a bronchiectasis exacerbation: 48 hours of at least two of the following:  Increased cough  Change in volume or appearance of sputum  Increased sputum purulence  Worsening shortness of breath and/or exercise tolerance  Fatigue and/or malaise  Coughing up blood (hemoptysis)    If you are experiencing some of these symptoms:  Increase the frequency and/or intensity of your airway clearance  Try to submit a sputum sample for bacterial and AFB cultures (at [facility] or locally)  Reach out to me or your local physician.  If you need antibiotics, recommend treating for 14 days.      Please bring any new airway clearance equipment to your next visit to ensure that you are using and caring for it properly.    Information about getting your COVID-19 vaccine or COVID-19 treatment: yourshot.org    Interested in participating in trials for a COVID-19 vaccine or treatments for COVID-19? https://www.coronaviruspreventionnetwork.org    Thank you for allowing me to be a part of your care. Please call the clinic with any questions.    Viona Gilmore, MD, MPH  Pulmonary and Critical Care Medicine  [address]  [id number]  [place], [place] [address]    Thank you for your visit to the [facility]. You may receive a survey from [facility] regarding your visit today, and we are eager to use this feedback to improve your experience. Thank you for taking the time to fill it out.    Between appointments, you can reach [place] at these numbers:  For appointments: [phone]  For my nurse, Alvino Blood: [phone]  Fax: [phone]  For urgent issues after hours: Hospital Operator @ [phone] & ask for Pulmonary Fellow on call    My [facility] Chart is for non-urgent messages. This means you have a simple medical question that does not require an immediate response.     If you need immediate attention, call 911.     Responses may take up to 3 business days. Your message will be read by your provider or another medical team member who may respond on your provider???s behalf.    Some questions cannot be answered through messages in My [facility] Chart. Depending on your question, your provider???s office may ask you to schedule an appointment.     Information sent through My [facility] Chart will become part of your medical record.    For further information, check out the websites below:    [facility] Bronchiectasis/NTM Care and Research Center: [email or web address]    Impact Airway Clearance Education: http://www.impact-be.com    Videos from the patient session of the World Bronchiectasis Conference in [place] on October 03, 2016 can be watched here (TrustyNews.es).     Bronchiectasis Toolbox (information about airway clearance): DiningCalendar.de    Copy for Individuals with Bronchiectasis and/or NTM through the Bronchiectasis Registry and the COPD Foundation: https://www.mckee-young.org/    Information about Non-tuberculous Mycobacterial Infections:  https://www.ntminfo.org     Interested in clinical trials and other research opportunities?  www.clinicaltrials.gov    The Rare Diseases Clinical Research Network [facility]) Genetic Disorders of Mucociliary Clearance Consortium [facility]) Contact Registry is a way for patients with disorders of mucociliary clearance (such as bronchiectasis) and their family members to learn about research studies they may be able to join. Participation is completely voluntary and you may choose to withdraw at any time. There is no cost to join the [employer]. For more information or to join the registry please go to  the following website:  [email or web address]

## 2021-10-14 ENCOUNTER — Telehealth
Admit: 2021-10-14 | Discharge: 2021-10-14 | Payer: PRIVATE HEALTH INSURANCE | Attending: Hospice and Palliative Medicine | Primary: Hospice and Palliative Medicine

## 2021-10-14 ENCOUNTER — Ambulatory Visit
Admit: 2021-10-14 | Discharge: 2021-10-14 | Payer: PRIVATE HEALTH INSURANCE | Attending: Internal Medicine | Primary: Internal Medicine

## 2021-10-14 DIAGNOSIS — J479 Bronchiectasis, uncomplicated: Principal | ICD-10-CM

## 2021-10-14 DIAGNOSIS — Z79899 Other long term (current) drug therapy: Secondary | ICD-10-CM | POA: Diagnosis not present

## 2021-10-14 DIAGNOSIS — J9611 Chronic respiratory failure with hypoxia: Secondary | ICD-10-CM | POA: Diagnosis not present

## 2021-10-14 DIAGNOSIS — G629 Polyneuropathy, unspecified: Secondary | ICD-10-CM | POA: Diagnosis not present

## 2021-10-14 DIAGNOSIS — R053 Chronic cough: Secondary | ICD-10-CM | POA: Diagnosis not present

## 2021-10-14 DIAGNOSIS — R600 Localized edema: Secondary | ICD-10-CM | POA: Diagnosis not present

## 2021-10-14 DIAGNOSIS — Z515 Encounter for palliative care: Secondary | ICD-10-CM | POA: Diagnosis not present

## 2021-10-14 DIAGNOSIS — G5783 Other specified mononeuropathies of bilateral lower limbs: Secondary | ICD-10-CM | POA: Diagnosis not present

## 2021-10-14 DIAGNOSIS — R2689 Other abnormalities of gait and mobility: Secondary | ICD-10-CM | POA: Diagnosis not present

## 2021-10-14 DIAGNOSIS — G609 Hereditary and idiopathic neuropathy, unspecified: Secondary | ICD-10-CM | POA: Diagnosis not present

## 2021-10-14 DIAGNOSIS — B441 Other pulmonary aspergillosis: Secondary | ICD-10-CM | POA: Diagnosis not present

## 2021-10-14 LAB — COMPREHENSIVE METABOLIC PANEL
ALBUMIN: 3.7 g/dL (ref 3.4–5.0)
ALKALINE PHOSPHATASE: 94 U/L (ref 46–116)
ALT (SGPT): 12 U/L (ref 10–49)
ANION GAP: 6 mmol/L (ref 5–14)
AST (SGOT): 20 U/L (ref <=34)
BILIRUBIN TOTAL: 0.3 mg/dL (ref 0.3–1.2)
BLOOD UREA NITROGEN: 23 mg/dL (ref 9–23)
BUN / CREAT RATIO: 25
CALCIUM: 9.4 mg/dL (ref 8.7–10.4)
CHLORIDE: 104 mmol/L (ref 98–107)
CO2: 28.9 mmol/L (ref 20.0–31.0)
CREATININE: 0.93 mg/dL
EGFR CKD-EPI (2021) MALE: 90 mL/min/{1.73_m2} (ref >=60)
GLUCOSE RANDOM: 87 mg/dL (ref 70–179)
POTASSIUM: 4.9 mmol/L — ABNORMAL HIGH (ref 3.4–4.8)
PROTEIN TOTAL: 7.4 g/dL (ref 5.7–8.2)
SODIUM: 139 mmol/L (ref 135–145)

## 2021-10-14 LAB — TSH: THYROID STIMULATING HORMONE: 1.334 u[IU]/mL (ref 0.550–4.780)

## 2021-10-14 LAB — T4, FREE: FREE T4: 1.19 ng/dL (ref 0.89–1.76)

## 2021-10-14 LAB — B-TYPE NATRIURETIC PEPTIDE: B-TYPE NATRIURETIC PEPTIDE: 36.71 pg/mL (ref <=100)

## 2021-10-14 LAB — VITAMIN B12: VITAMIN B-12: >2000 pg/mL — ABNORMAL HIGH (ref 211–911)

## 2021-10-14 MED ORDER — DORNASE ALFA 1 MG/ML SOLUTION FOR INHALATION
Freq: Every day | RESPIRATORY_TRACT | 3 refills | 90 days | Status: CP
Start: 2021-10-14 — End: 2022-10-14

## 2021-10-14 NOTE — Unmapped (Signed)
Adult Pulmonary Specialty Clinic Pharmacist Note      Re-routed medication orders for Jordan [name]     1. Bronchiectasis without complication (CMS-HCC)  Pulmozyme sent to [facility] per MAP team request    Pharmacy sent to:  [facility]    Electronically signed by:  Alben Spittle, PharmD, BCACP, CPP  Clinical Pharmacist Practitioner  [facility]  [phone]

## 2021-10-14 NOTE — Unmapped (Signed)
Outpatient Palliative Care Consult Note    Reason for Consult Request: Goals of Care / Decision Making, Patient and Family Support and advance care planning  Primary Care Provider:  Barbaraann Boys, MD  Referring physician: Dr. Rolm Baptise    Assessment: Jordan [name] is a 66 y.o. male with bronchiectasis with chronic pseudomonas colonization and chronic hypoxemic respiratory failure requiring supplemental O2 who presents to palliative care for patient support, advance care planning and decision-making.       Plan:   # Imbalance/Numbness bottom of feet/LE swelling: initially thought was s/t voriconazole and he is being weaned off of it. However, his symptoms continue to progress and are worsening. Dr. Garner Nash has started initial work up and referring to neurology for further evaluation.  - Will monitor    # Poor appetite/Weight Loss/Poor Sleep: improved since last visit in April.  - Continue mirtazapine 15 mg PO nightly    # Dyspnea/cough: Worsening. Due to bronchiectasis  - Continue management as per Dr. Garner Nash- pulmonary therapies and will be getting new oxygen concentrator.  - Start hydrocodone-homatropine cough syrup every 8 hrs prn severe cough-> discussed with Dr. Garner Nash and confirms okay to add    # Goals of care:  - Understanding of medical condition and prognosis: Knows he has a chronic and progressive disease. He has been told he is not a lung transplant candidate.  - Goals and Priorities: Spend time with family and be able to go to his grandchildren's activities.    # Advance Care Planning  - Surrogate Decision-maker and/or Health care agent:   HCDM (HCPOA): Jordan, [name] - Spouse [phone]   - Advance Directives: He reports having an HCPOA and Living Will    5. Coping/Support:  - Strong family support and strong faith.  - He shared the multiple symptoms he has been navigating recently and work up that Dr. Garner Nash has proposed. In some ways doing better- weight improved, eating more, but overall has progressive symptoms that are concerning and can limit what he does. Family and his faith continue to provide him strength and support. He continues to try and do things that allow him to be with family like go to grandchildren's sporting events as he can tolerate.  -Palliative care visit today included focused interview, active listening, therapeutic use of silence, offering support, sharing empathy and summarization of today's discussion.    6. Care coordination  - Discussed with Dr. Garner Nash use of opioids for refractory cough and she is in agreement.      F/u: Return in about 6 weeks (around 11/25/2021) for Video Visit.    ---------------------------------------  Interval History:  Reports progressive numbness in b/l feet and now up legs. Also with LE swelling. Worsening cough with episodes of throwing up. Recent PFTs show decline in lung function. He is weaning off of voriconazole.    Palliative Performance Scale: 60% - Ambulation: Reduced / unable to do hobby or some housework, significant disease / Self-Care:Occasional assist as necessary / Intake:Normal or reduced / Level of Conscious: Full or confusion    PHYSICAL EXAM:   Vital signs for this encounter: There were no vitals taken for this visit.  Older man, appears tired, wearing supplemental O2 and not in distress, breathing appears unlabored and no accessory muscle use noted. Linear thought process.        The patient reports they are currently: at home. I spent 24 minutes on the real-time audio and video with the patient on the date  of service. I spent an additional 15 minutes on pre- and post-visit activities on the date of service.     The patient was physically located in [place] or a state in which I am permitted to provide care. The patient and/or parent/guardian understood that s/he may incur co-pays and cost sharing, and agreed to the telemedicine visit. The visit was reasonable and appropriate under the circumstances given the patient's presentation at the time.    The patient and/or parent/guardian has been advised of the potential risks and limitations of this mode of treatment (including, but not limited to, the absence of in-person examination) and has agreed to be treated using telemedicine. The patient's/patient's family's questions regarding telemedicine have been answered.     If the visit was completed in an ambulatory setting, the patient and/or parent/guardian has also been advised to contact their provider???s office for worsening conditions, and seek emergency medical treatment and/or call 911 if the patient deems either necessary.      Darrin Nipper, MD  [facility]

## 2021-10-14 NOTE — Unmapped (Signed)
Continuing to take voriconazole 1/2 tablet daily since Friday (7/21). Pt tried to stop after 1 pill but could not sleep so restarted at 1/2 pill. Since restarting voriconazole no trouble sleeping. Did not have trouble sleeping prior to voriconazole.    Patient continues to endorse peripheral edema and neuropathy. Right (1-2+ pitting edema) worse than L. Bottom of toes are numb bilaterally and numbness is working its way up his foot/ankle. PCP reports good pulses bilaterally. Pt started voriconazole in Jan and thinks the swelling started in April and the neuropathy started 2-3 weeks after the swelling started. Daughter feels swelling has gotten a little worse since tapering off the voriconazole.     Pt has been on diltiazem for dilated aorta since at least Oct 2022 but dose is unchanged.     Neuropathy could be related to voriconazole; however, it seems unusual that the neuropathy would be worsening despite tapering off. Advised patient to check the bottoms of his feet to check for wounds and to be careful walking barefoot. Unlikely that diltiazem would just now be causing edema. Discussed with provider and referring to neurology for work up.    Alben Spittle, PharmD, BCACP, CPP  Clinical Pharmacist Practitioner  [facility] Adult Cystic Fibrosis/Pulmonary Clinic  [phone]

## 2021-10-15 DIAGNOSIS — G4733 Obstructive sleep apnea (adult) (pediatric): Secondary | ICD-10-CM | POA: Diagnosis not present

## 2021-10-15 DIAGNOSIS — J479 Bronchiectasis, uncomplicated: Secondary | ICD-10-CM | POA: Diagnosis not present

## 2021-10-15 DIAGNOSIS — J449 Chronic obstructive pulmonary disease, unspecified: Secondary | ICD-10-CM | POA: Diagnosis not present

## 2021-10-15 NOTE — Unmapped (Signed)
Opened in error

## 2021-10-16 LAB — SERUM PROTEIN ELECTROPHORESIS AND IMMUNOFIXATION
ALBUMIN (SPE): 4 g/dL (ref 3.5–5.0)
ALPHA-1 GLOBULIN: 0.3 g/dL (ref 0.2–0.5)
ALPHA-2 GLOBULIN: 0.9 g/dL (ref 0.5–1.1)
BETA-1 GLOBULIN: 0.4 g/dL (ref 0.3–0.6)
BETA-2 GLOBULIN: 0.4 g/dL (ref 0.2–0.6)
GAMMAGLOBULIN: 1.2 g/dL (ref 0.5–1.5)
PROTEIN TOTAL (SPECIAL CHEM): 7.1 g/dL

## 2021-10-22 DIAGNOSIS — J471 Bronchiectasis with (acute) exacerbation: Secondary | ICD-10-CM | POA: Diagnosis not present

## 2021-10-22 DIAGNOSIS — J479 Bronchiectasis, uncomplicated: Secondary | ICD-10-CM | POA: Diagnosis not present

## 2021-10-22 DIAGNOSIS — G4733 Obstructive sleep apnea (adult) (pediatric): Secondary | ICD-10-CM | POA: Diagnosis not present

## 2021-10-31 DIAGNOSIS — Z79899 Other long term (current) drug therapy: Principal | ICD-10-CM

## 2021-11-04 DIAGNOSIS — Z79899 Other long term (current) drug therapy: Secondary | ICD-10-CM | POA: Diagnosis not present

## 2021-11-05 LAB — TOBRAMYCIN LEVEL, TROUGH: TOBRAMYCIN TROUGH, SERUM: 0.6 ug/mL (ref 0.5–2.0)

## 2021-11-10 MED ORDER — EMPTY CONTAINER
0 refills | 0 days | Status: CP
Start: 2021-11-10 — End: ?

## 2021-11-10 NOTE — Unmapped (Signed)
[facility] Refill Coordination Note    Specialty Medication(s) to be Shipped:   CF/Pulmonary/Asthma: Colistimethate    Other medication(s) to be shipped: syringes, saline, needles, sodium chloride, sharps container     [name], DOB: 07/11/1955  Phone: [phone] (home)       All above HIPAA information was verified with patient.     Was a Nurse, learning disability used for this call? No    Completed refill call assessment today to schedule patient's medication shipment from the [facility] [phone]).  All relevant notes have been reviewed.     Specialty medication(s) and dose(s) confirmed: Regimen is correct and unchanged.   Changes to medications: Jubei reports no changes at this time.  Changes to insurance: No  New side effects reported not previously addressed with a pharmacist or physician: None reported  Questions for the pharmacist: No    Confirmed patient received a Conservation officer, historic buildings and a Surveyor, mining with first shipment. The patient will receive a drug information handout for each medication shipped and additional FDA Medication Guides as required.       DISEASE/MEDICATION-SPECIFIC INFORMATION        For patients on injectable medications: Patient currently has 0 doses left.  Next injection is scheduled for 9/1.    SPECIALTY MEDICATION ADHERENCE     Medication Adherence    Patient reported X missed doses in the last month: 0  Specialty Medication: colistimethate 150 mg injection (COLYMYCIN)  Patient is on additional specialty medications: No  Additional Specialty Medications: tobramycin (PF) 300 mg/5 mL nebulizer solution ([name])  Patient is on more than two specialty medications: No  Any gaps in refill history greater than 2 weeks in the last 3 months: no  Demonstrates understanding of importance of adherence: yes  Informant: patient          Support network for adherence: family member                  Were doses missed due to medication being on hold? No    Colistimethate 150mg /ml: Patient has 0 days of medication on hand     REFERRAL TO PHARMACIST     Referral to the pharmacist: Not needed      [facility]     Shipping address confirmed in Epic.     Delivery Scheduled: Yes, Expected medication delivery date: 8/25.     Medication will be delivered via UPS to the prescription address in Epic WAM.    Olga Millers   [facility] Specialty Technician

## 2021-11-12 MED ORDER — FUROSEMIDE 20 MG TABLET
ORAL_TABLET | Freq: Every day | ORAL | 0 refills | 30 days | Status: CP
Start: 2021-11-12 — End: 2021-12-12

## 2021-11-13 MED FILL — EMPTY CONTAINER: 120 days supply | Qty: 1 | Fill #0

## 2021-11-13 MED FILL — BD REGULAR BEVEL NEEDLES 21 GAUGE X 1 1/2": 30 days supply | Qty: 60 | Fill #4

## 2021-11-13 MED FILL — BD POSIFLUSH NORMAL SALINE 0.9 % INJECTION SYRINGE: 30 days supply | Qty: 180 | Fill #4

## 2021-11-13 MED FILL — BD LUER-LOK SYRINGE 3 ML 21 GAUGE X 1": 30 days supply | Qty: 60 | Fill #1

## 2021-11-13 MED FILL — SODIUM CHLORIDE 10 % FOR NEBULIZATION: RESPIRATORY_TRACT | 30 days supply | Qty: 900 | Fill #2

## 2021-11-13 MED FILL — COLISTIN (COLISTIMETHATE SODIUM) 150 MG SOLUTION FOR INJECTION: 30 days supply | Qty: 60 | Fill #2

## 2021-11-14 ENCOUNTER — Ambulatory Visit: Admit: 2021-11-14 | Discharge: 2021-11-15 | Payer: PRIVATE HEALTH INSURANCE

## 2021-11-14 DIAGNOSIS — R531 Weakness: Secondary | ICD-10-CM | POA: Diagnosis not present

## 2021-11-14 DIAGNOSIS — J479 Bronchiectasis, uncomplicated: Secondary | ICD-10-CM | POA: Diagnosis not present

## 2021-11-14 DIAGNOSIS — B441 Other pulmonary aspergillosis: Secondary | ICD-10-CM | POA: Diagnosis not present

## 2021-11-14 DIAGNOSIS — R918 Other nonspecific abnormal finding of lung field: Secondary | ICD-10-CM | POA: Diagnosis not present

## 2021-11-14 DIAGNOSIS — R600 Localized edema: Secondary | ICD-10-CM | POA: Diagnosis not present

## 2021-11-14 LAB — BASIC METABOLIC PANEL
ANION GAP: 9 mmol/L (ref 5–14)
BLOOD UREA NITROGEN: 20 mg/dL (ref 9–23)
BUN / CREAT RATIO: 17
CALCIUM: 9.2 mg/dL (ref 8.7–10.4)
CHLORIDE: 106 mmol/L (ref 98–107)
CO2: 25.7 mmol/L (ref 20.0–31.0)
CREATININE: 1.17 mg/dL — ABNORMAL HIGH
EGFR CKD-EPI (2021) MALE: 68 mL/min/{1.73_m2} (ref >=60)
GLUCOSE RANDOM: 108 mg/dL (ref 70–179)
POTASSIUM: 4.3 mmol/L (ref 3.4–4.8)
SODIUM: 141 mmol/L (ref 135–145)

## 2021-11-14 LAB — MAGNESIUM: MAGNESIUM: 1.6 mg/dL (ref 1.6–2.6)

## 2021-11-15 DIAGNOSIS — J479 Bronchiectasis, uncomplicated: Secondary | ICD-10-CM | POA: Diagnosis not present

## 2021-11-15 DIAGNOSIS — G4733 Obstructive sleep apnea (adult) (pediatric): Secondary | ICD-10-CM | POA: Diagnosis not present

## 2021-11-15 DIAGNOSIS — J449 Chronic obstructive pulmonary disease, unspecified: Secondary | ICD-10-CM | POA: Diagnosis not present

## 2021-11-20 NOTE — Unmapped (Addendum)
Stopping voriconazole.  Repeat chest CT in 6 months.    Checking labs and getting CT with contrast of abdomen and pelvis.    Referral to endocrinology for osteoporosis.    Keep tabs on how you are feeling off Pulmozyme.    Get flu shot, COVID-19 booster, and RSV shot.    Symptoms of a bronchiectasis exacerbation: 48 hours of at least two of the following:  Increased cough  Change in volume or appearance of sputum  Increased sputum purulence  Worsening shortness of breath and/or exercise tolerance  Fatigue and/or malaise  Coughing up blood (hemoptysis)    If you are experiencing some of these symptoms:  Increase the frequency and/or intensity of your airway clearance  Try to submit a sputum sample for bacterial and AFB cultures (at [facility] or locally)  Reach out to me or your local physician.  If you need antibiotics, recommend treating for 14 days.      Please bring any new airway clearance equipment to your next visit to ensure that you are using and caring for it properly.    Information about getting your COVID-19 vaccine or COVID-19 treatment: yourshot.org    Interested in participating in trials for a COVID-19 vaccine or treatments for COVID-19? https://www.coronaviruspreventionnetwork.org    Thank you for allowing me to be a part of your care. Please call the clinic with any questions.    Viona Gilmore, MD, MPH  Pulmonary and Critical Care Medicine  [address]  [id number]  [place], [place] [address]    Thank you for your visit to the [facility]. You may receive a survey from [facility] regarding your visit today, and we are eager to use this feedback to improve your experience. Thank you for taking the time to fill it out.    Between appointments, you can reach [place] at these numbers:  For appointments: [phone]  For my nurse, Alvino Blood: [phone]  Fax: [phone]  For urgent issues after hours: Hospital Operator @ [phone] & ask for Pulmonary Fellow on call    My [facility] Chart is for non-urgent messages. This means you have a simple medical question that does not require an immediate response.     If you need immediate attention, call 911.     Responses may take up to 3 business days. Your message will be read by your provider or another medical team member who may respond on your provider???s behalf.    Some questions cannot be answered through messages in My [facility] Chart. Depending on your question, your provider???s office may ask you to schedule an appointment.     Information sent through My [facility] Chart will become part of your medical record.    For further information, check out the websites below:    [facility] Bronchiectasis/NTM Care and Research Center: [email or web address]    Impact Airway Clearance Education: http://www.impact-be.com    Be Clear with Bronchiectasis by Toniann Ket, MPH (health educator and patient): [email or web address]    Videos from the patient session of the World Bronchiectasis Conference in [place] on October 03, 2016 can be watched here (TrustyNews.es).     Bronchiectasis Toolbox (information about airway clearance): DiningCalendar.de    Copy for Individuals with Bronchiectasis and/or NTM through the Bronchiectasis Registry and the COPD Foundation: https://www.mckee-young.org/    Exercise program by [facility] [place]: http://www.brown.com/    Information about Non-tuberculous Mycobacterial Infections:  https://www.ntminfo.org     Interested in clinical trials and other  research opportunities?  www.clinicaltrials.gov    The Rare Diseases Clinical Research Network [facility]) Genetic Disorders of Mucociliary Clearance Consortium [facility]) Contact Registry is a way for patients with disorders of mucociliary clearance (such as bronchiectasis) and their family members to learn about research studies they may be able to join. Participation is completely voluntary and you may choose to withdraw at any time. There is no cost to join the [employer]. For more information or to join the registry please go to the following website:  [email or web address]

## 2021-11-20 NOTE — Unmapped (Signed)
[facility]   Assessment:      Patient:Jordan [name] (06-Apr-1955)    Jordan [name] is a 66 y.o. male who is seen for follow up of bronchiectasis with chronic Pseudomonas colonization and chronic hypoxemic respiratory failure requiring supplemental oxygen with exertion and sleep. After admission to [facility] for worsening dyspnea despite oral antibiotics, developed hemoptysis. 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  Also found to have tortuous bronchial artery supplying the RUL; s/p BAE 07/11/21. Developed peripheral neuropathy which we thought was potentially due to the voriconazole.  However, it has not improved with cessation and he has now developed lower extremity edema.     Plan:      Problem List Items Addressed This Visit    None  Therapies:  {MLDNEWRTPLAN:82855:p}    Testing:  {MLDSPUTUM:53428}  If he has future exacerbations, sputum should be sent for bacterial and AFB cultures. The bacterial culture should be processed like a cystic fibrosis sample given the overlapping pathogens. There are standing orders for these cultures in our system.  {MLDTesting:96924}    Referrals:  {MLDReferrals:96923}    Vaccinations:  COVID-19: {MLDVAX:96925}  Flu: {MLDVAX:96925}  Pneumococcal: {MLDVAX:96925}  Tdap: {MLDVAX:96925}  RSV: {MLDVAX:96925}    Jordan [name] will return to clinic in *** months for follow-up with {MLDRTN:53454}.  He will call or send me a message via MyChart if questions or concerns arise before this visit. Jordan [name] is in agreement with the above plan.     Subjective:      HPI: Jordan [name] is a 66 y.o. male who is seen for follow up of bronchiectasis with PsA colonization and prior history of multiple lung resections.    09/27/18:  I last saw him on 05/17/18. He reached out at the end of May reporting symptoms c/w an exacerbation. I treated him with a two week course of Ciprofloxacin, which seemed to help.  Reports that he feels winded too much - if walks up incline or up a couple hundred feet. This has worsened over the past year. Has to stop and let lungs catch up.  Of note, he has been off testosterone supplementation for the past year after he was diagnosed with prostate cancer.    He received the [facility], which he is using with nebulized HTS 7% and Pulmozyme. After using the Vest for 6-8 weeks, he noticed improvement with more phlegm mobilization. Sometimes has to pause it to cough to get stuff up.  Plugging feeling happening a lot more, especially with the vest.  Most of the time, sputum is thinned out with Pulmozyme but still gets those plugs.    Had to stop eating ice cream or drinking milkshake - will cough will eating and that makes him vomit on occasion. Past few weeks, when eating will cough, no matter what he eats.  Happens almost once a day. Doesn't feel like things get stuck typically.      02/07/19:  Following his last visit, Jordan [name] reached out regarding portable oxygen.  We had considered the life 2000 but he does not qualify because he does not use supplemental oxygen.  He performed the 6-minute walk test prior to today's visit but did not desaturate.  He wonders if this is because the test was done in the morning and he doesn't have breathing issues in the morning.  Typically, breathing issues occur in afternoon.  During these episodes, he will have to use his inhaler and feels like he  is suffocating.  He describes it as an uncomfortable feeling that limits his ability to do things with family.  If he is having trouble catching breath, oximetry is 92-93%. Has seen it in the 80s if comes in from outside and is short of breath.    For airway clearance, he is nebulizing 10% hypertonic saline and using his percussive vest for 20 minute sessions in AM and PM. Has to pause it to cough and get stuff up.  Coughing up more stuff since last visit and is waking up 1-2 times a night with cough.having coughing fits between clearance session.  What he expectorates is less of a mucous plug and mostly stringy sputum that is yellowish to light brown in color.  No hemoptysis.  On Saturday, he had a coughing fit that nearly triggered posttussive emesis.  He only expectorated about 3-4 globs of phlegm.  Nebulizing Pulmozyme daily.  Alternates [name] and colistin every other month.  On [name] this month and will start Colistin on Dec 1.    06/06/19:  Coughing more and more sputum.  Waking up 1-2 times per night coughing. Can cough once or just keep coughing.  Sputum is yellow in color without evidence of blood.  He is not expectorating green sputum currently. On colistin this month; alternates with [name]. Went through January off all inhaled antibiotics.  Oxygen levels in the low 90s.  Uses rescue inhaler if goes up stairs.  Regularly performing airway clearance with nebulized 10% hypertonic saline twice a day, percussive vest twice a day, and Pulmozyme daily. He last received IV antibiotics in January 2019 but has been treated with oral antibiotics on 04/19/18 and 08/17/18, 03/10/19.      11/10/19:  1-2 times per week can get sharp right sided chest pain - even with nipple line. Can happen with clearance or activity or rest. Never during sleep. Following his hospitalization, he first experienced it in June. Lasts only about 10 seconds. Not reproducible. Limited activity due to dyspnea. Regularly performing airway clearance with nebulized 10% hypertonic saline twice a day, percussive vest twice a day, and Pulmozyme daily. Remains on chronic azithromycin therapy.    02/13/20:  Showed up to [facility] with oxygen saturations of 87-88% after walking from parking lot (<100 yd) and up incline without supplemental oxygen.  Using 2L continuous flow with exertion and sleep.  Had sleep study done at [facility] on RA and was told that he should be fine with just the supplemental oxygen. I do not have this report. Using oxygen when goes to football game; needs long period to recover from activity.  Going up 20 foot incline with oxygen, will have to stop and catch breath.     Wheezing more lately and endorses chest tightness. Has need to use albuterol as rescue 3-4 times per day.  Used albuterol in the car on the way here. Typically would be able to cough up large amount of mucus after PFTs but only coughed up small amount. Feels like mucus is stuck in lungs despite twice daily airway clearance with 10% HTS and Vest. Also using Pulmozyme once a day and Symbicort twice a day after clearance.  Using colistin this month. He ordered the [name] nebs to use next month even though insurance won't cover it. Feels like inhaled antibiotics definitely help.    05/10/20:   Saw [facility] for lung transplant evaluation.  Felt to be poor candidate due to prior surgeries and primary disease.  Candidacy closed  at [facility]. Referral sent to [facility] and [place] on 2/15.  Based on labs and testing done there, getting Hep B vaccination series (not Hep A or Hep B immune).  Also getting DEXA that was ordered by Dr. Abigail Miyamoto.    Had PSG done at [facility] - no evidence of OSA but does have increased upper airway resistance.  He never sleeps on his back; only sleeps on side or stomach.   Fatigue is most burdensome symptom.    Regrets that he let the surgeon operate on his lung before he met me.  He completed pulmonary rehab at [facility] but they only had him on machine for ~30 minutes and then stretching.  No strength exercises. He is planning on joining a gym based on recommendations from [facility] transplant.  Was surprised that he didn't desaturate during his walk test at [facility].    07/02/20:  At last visit, referred him to [facility] for pulmonary rehab. Found to have osteoporosis in hips. Turned down for transplant at [facility] based on chart review.  No change in breathing or cough.  Every once in a while, has bad coughing spells.  Same sputum volume and color. Globby to stringy. Not dark brown or bloody.  Sputum is light brown to yellowish.  Breathing about the same.  Started on 1200 mg calcium for osteoporosis.  Starting telehealth pulmonary rehab - feels optimistic that it will help him do more.    10/31/20:  Heart monitor for pulm rehab - had 40 events of fast rate or afib.  Has felt heart fluttering.  Did event monitor for 8 days.  Getting echocardiogram on Wednesday and then seeing cardiologist the next week.     Was having sharp pains over right abdomen below ribs.  Once a month, now once every 2 weeks.    03/18/21:  Since his last clinic visit, he was evaluated by cardiology at [facility] (October) and then was admitted to [facility] earlier this month due to hemoptysis that occurred while on ciprofloxacin for an exacerbation.  He also had worsening shortness of breath.  Was discharged home on 21 days of meropenem.  Did not undergo embolization or bronchoscopy.     Hasn't felt good since August.  No energy, lost weight. End Nov, asked for Cipro and for few days felt better. Then didn't feel better and was ordeal to see PCP.  WBC elevated in ER - they called me. Friday night/Saturday started seeing dark blood in sputum.  Monday night 8PM, got breathing treatment with bright red globs.  Large volume blood and phlegm. They were going to do bronchoscopy but then bleeding improved with change in abx, holding 10% saline. Was continued on percussive vest and Pulmozyme. Friday to Saturday, wine colored.  Then pinkish. Sunday night could barely see blood and then gone Monday.    Finished Meropenem last night (3 weeks total). Christmas eve morning, started seeing bright red streaks. Yesterday, coughed up bright red blood that was 50/50 with phlegm.  Today more wine colored.   Shortness of breath better with meropenem but no change in hemoptysis.    07/01/21:  After last visit, we attempted to perform bronchoscopy but due to hypoxemia, we canceled the procedure.  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  He also had a visit with Dr. Erven Colla in interventional radiology and is scheduled for bronchial artery embolization later this month.  Feels off balance but not dizzy/spinning.  This has been intensifying with increasing the voriconazole dose. Happening daily every time he stands up. No hearing changes. Head feels heavy.  No vision changes nor double visions. Sinuses up and down, bleed a little bit.  Some post-nasal drip.     10/14/21:  At last visit, pursued Life2000 for his work of breathing. Started mirtazapine for appetite, mood, and sleep. Underwent BAE on 07/11/21. Tapering off voriconazole due to development of peripheral neuropathy and lower extremity swelling.  Had been on voriconazole for about 5 months.    Swelling started slightly in April and has progressed. Neuropathy started in tips of toes a few weeks ago but now at ankles.  More shortness of breath when laying flat.  Has woken up at night short of breath.  Work of breathing outside is better on L2K.    11/21/21:  At last visit, reporting ascending peripheral neuropathy and more rapidly progressive lower extremity edema. Planned to continue voriconazole 100 mg daily and repeat chest CT to see if any evolution of his mycetoma. No lab evidence that he is absorbing his inhaled antibiotics.    Since last visit, ***    Respiratory Symptoms:   Cough: more bad coughing spells - can be when doing breathing treatments, eating, or just sitting. Sputum brown and slightly darker.  No green. Sunday had a couple of times of thin coat of bright red blood covering sputum.  Not feeling more congested in chest.  Nocturnal awakenings: increased from last visit.    Wheezing: absent.  Chest tightness: absent.  Rescue albuterol use: Not in months.  Pleurisy: sporadic once every other day over prior resection.  Hemoptysis: as above.  MMRC Stage: 3 = I stop for breath after walking about 100 yards or after a few minutes on level ground.     Exacerbations:   Number of exacerbations treated in the past year: 3  Dates of exacerbations: 03/2018, 07/2018, 02/2019, 09/2019, 12/2019, 10/2020, 01/2021, 07/2021  Number of hospitalizations for exacerbations in the past 2 years: 3  Dates of hospitalizations: 01/2015, 03/2015, 01/2016, 03/2017, 09/2019, 10/2020, 02/2021    Pulmonary Therapies:   Airway clearance: Pulmozyme daily, HTS 10% bid, percussive vest bid.   Inhaled antibiotics: Alternates [name] and Colistin.- on Colistin currently.  Inhalers: Symbicort 160/4.5, Spiriva Respimat  Chronic antibiotics: azithromycin 250 mg daily. Down to voriconazole 100 mg (04/04/21-present)  Exercise: Was walking 3 miles at 2.1 without stopping on the treadmill.  Pulmonary Rehab: Telehealth pulmonary rehab July 2022  Supplemental oxygen: 2L continuous when out of house and with sleep. Using L2K and finds it helpful.  NIPPV: No    ROS: Lost appetite and it hasn't come back. Having trouble sleeping and mood is down. No F/C/NS.     Past Medical History:   Diagnosis Date    Abscess of lung (CMS-HCC) 11/06/2010    CT Chest 10/22/10 12/05/2010 right thoracotomy resection of right middle lobe and resection of right lower lobe abscess     Arthritis     back    Biceps tendon tear 2013    left side    Bronchiectasis (CMS-HCC)     chronic psuedomonas infection    Degenerative joint disease of left acromioclavicular joint     GERD (gastroesophageal reflux disease)     Hypertension     Obstructive sleep apnea on CPAP 03/14/13    AHI 33.5, on BiPAP 12/8 based on sleep study 09/2013    Pneumonia 2012  Lung abcess     PONV (postoperative nausea and vomiting)     Prostate cancer (CMS-HCC) 09/09/2017    Rotator cuff injury left    Vertigo        Past Surgical History:   Procedure Laterality Date    APPENDECTOMY  1969    BACK SURGERY      BRONCHOSCOPY  10/19/2010    [facility]    CARPAL TUNNEL RELEASE  1999    IR EMBOLIZATION HEMORRHAGE ART OR VEN  LYMPHATIC since embolization. Nothing major.  MMRC Stage: 3 = I stop for breath after walking about 100 yards or after a few minutes on level ground. Finds L2K helpful when outside.  Finds that he is able to get so immersed in what he is doing that the L2K starts beeping because either battery or oxygen tank is low.    Exacerbations:   Number of exacerbations treated in the past year: 2  Dates of exacerbations: 03/2018, 07/2018, 02/2019, 09/2019, 12/2019, 10/2020, 01/2021, 07/2021  Number of hospitalizations for exacerbations in the past 2 years: 2  Dates of hospitalizations: 01/2015, 03/2015, 01/2016, 03/2017, 09/2019, 10/2020, 02/2021    Pulmonary Therapies:   Airway clearance: Pulmozyme daily (Waiting on refill - ran out last Wednesday), HTS 10% bid, percussive vest bid.   Inhaled antibiotics: Alternates [name] and Colistin.- restarting Colistin today.  Inhalers: Symbicort 160/4.5, Spiriva Respimat  Chronic antibiotics: azithromycin 250 mg daily. Down to voriconazole 100 mg (04/04/21-present)  Exercise: Was walking 3 miles at 2.1 without stopping on the treadmill.  Pulmonary Rehab: Telehealth pulmonary rehab July 2022  Supplemental oxygen: 2L continuous when out of house and with sleep. Using L2K and finds it helpful.  NIPPV: No    ROS: Weight gain which he attributes to fluid and increased appetite. No F/C/NS.     Past Medical History:   Diagnosis Date    Abscess of lung (CMS-HCC) 11/06/2010    CT Chest 10/22/10 12/05/2010 right thoracotomy resection of right middle lobe and resection of right lower lobe abscess     Arthritis     back    Biceps tendon tear 2013    left side    Bronchiectasis (CMS-HCC)     chronic psuedomonas infection    Degenerative joint disease of left acromioclavicular joint     GERD (gastroesophageal reflux disease)     Hypertension     Obstructive sleep apnea on CPAP 03/14/13    AHI 33.5, on BiPAP 12/8 based on sleep study 09/2013    Pneumonia 2012    Lung abcess     PONV (postoperative nausea and vomiting) Morphine Nausea And Vomiting           Current Outpatient Medications   Medication Sig Dispense Refill    albuterol 2.5 mg /3 mL (0.083 %) nebulizer solution INHALE 3 ML (2.5 MG TOTAL) BY NEBULIZATION TWO (2) TIMES A DAY. 525 mL 3    albuterol HFA 90 mcg/actuation inhaler Inhale 2 puffs Four (4) times a day. (Patient taking differently: Inhale 2 puffs four (4) times a day as needed.) 25.5 g 3    alcohol swabs (ALCOHOL PREP PADS) PadM Use as directed with inhaled antibiotics 100 each 5    alendronate (FOSAMAX) 70 MG tablet TAKE 1 TABLET BY MOUTH EVERY 7 DAYS FOR 12 DOSES      ascorbic acid, vitamin C, (VITAMIN C) 1000 MG tablet Take 1 tablet (1,000 mg total) by mouth daily.      atorvastatin (LIPITOR) 10  MG tablet Take 1 tablet (10 mg total) by mouth daily.      azithromycin (ZITHROMAX) 250 MG tablet TAKE 1 TABLET BY MOUTH EVERY DAY 90 tablet 3    budesonide-formoteroL (SYMBICORT) 80-4.5 mcg/actuation inhaler Inhale 2 puffs Two (2) times a day. 10.2 g 11    calcium carbonate (CALCIUM 600 ORAL) Take 2 tablets by mouth two (2) times a day. 1 in morning and 1 in the evening      cholecalciferol, vitamin D3-50 mcg, 2,000 unit,, 50 mcg (2,000 unit) cap Take 1 capsule (50 mcg total) by mouth daily.      coenzyme Q10 300 mg cap Take 10-300 mg by mouth daily.      colistimethate (COLYMYCIN) 150 mg injection Inject 2 mL sterile water for injection to mix colistin vial, then draw up 2 mL (150mg ) and inhale 2 times a day, 28 days on and 28 days off. 60 each 5    dornase alfa (PULMOZYME) 1 mg/mL nebulizer solution Inhale 1 ampule (2.5 mg) daily. Use at least 30-60 minutes before airway clearance, or after airway clearance 225 mL 3    empty container (SHARPS CONTAINER) Misc USE AS DIRECTED 1 each 0    empty container Misc USE AS DIRECTED 1 each 2    ferrous sulfate 325 (65 FE) MG tablet Take 1 tablet (325 mg total) by mouth Two (2) times a day. BID      furosemide (LASIX) 20 MG tablet Take 1 tablet (20 mg total) by mouth daily. 30 tablet 0    HYDROcodone-acetaminophen (NORCO) 7.5-325 mg per tablet Take 1 tablet by mouth three (3) times a day (at 6am, noon and 6pm).      mirtazapine (REMERON) 15 MG tablet Take 1 tablet (15 mg total) by mouth nightly. 90 tablet 3    montelukast (SINGULAIR) 10 mg tablet TAKE 1 TABLET BY MOUTH EVERY DAY AT NIGHT 90 tablet 3    multivitamin-minerals-lutein Tab Take 1 tablet by mouth daily.       nebulizers (LC PLUS) Misc Use with inhaled medications 1 each 5    needle, disp, 21 G (BD REGULAR BEVEL NEEDLES) 21 gauge x 1 1/2 Ndle Use as directed with inhaled Colistin 100 each 3    omeprazole (PRILOSEC) 20 MG capsule Take 1 capsule (20 mg total) by mouth Two (2) times a day.      sodium chloride (BD POSIFLUSH NORMAL SALINE 0.9) 0.9 % injection Inject 2mL of 0.9%NaCl into colistin vial & gently mix. After withdrawing colistin dose, add an additional 1mL of 0.9%NaCl to neb cup with the colistin dose. 180 mL 11    sodium chloride 10 % Nebu Measure and Inhale 5 mL by nebulization Two (2) times a day. Discard remaining amount and use a new vial for each dose. 900 mL 3    sterile water Soln Use 2mL to mix Colistin, then add additional 2mL to neb cup with 2mL of mixed colistin. Inhale twice daily 28 days on and 28 days off. 600 mL 5    syringe with needle (BD LUER-LOK SYRINGE) 3 mL 20 gauge x 1 1/2 Syrg For use with inhaled antibiotic (Colistin) 60 each 30    syringe with needle (BD LUER-LOK SYRINGE) 3 mL 21 gauge x 1 Syrg For use with inhaled antibiotic (Colistin) 60 each 5    tiotropium bromide (SPIRIVA RESPIMAT) 2.5 mcg/actuation inhalation mist Inhale 2 puffs daily. 12 g 3    tobramycin, PF, ([name]) 300 mg/5 mL nebulizer solution Inhale  5 mL (300 mg total) by nebulization every twelve (12) hours. 28 days on and 28 days off. 280 mL 5    voriconazole (VFEND) 200 MG tablet Take 2 tablets (400 mg total) by mouth Two (2) times a day. (Patient taking differently: Take 1 tablet (200 mg total) by mouth in the morning. Half a tablet once a day.) 120 tablet 3    zinc gluconate 50 mg (7 mg elemental zinc) tablet Take 1 tablet (50 mg total) by mouth.       No current facility-administered medications for this visit.     Physical Exam:   There were no vitals taken for this visit.  Fatigued appearing white male in no acute distress. Easy work of breathing without accessory muscle use. Speaking in full sentences.  Diminished air entry over RUL as compared to LUL posteriorly.  Crackles over entire left lung field.  Regular rate and rhythm with normal S1 and S2.  No murmurs, rubs, or gallops.  Clubbing unchanged from prior. 2+ pitting edema of bilateral lower extremities to at least mid-shin.    Diagnostic Review:   The following data were reviewed during this visit with key findings summarized below:  Pulmonary Function Testing:      Spirometry consistent with severe airway obstruction and suggestive of moderate restriction.  No significant change from prior.  Normal inspiratory loop.    FVC (% predicted) FEV1 (% predicted) FEV1/FVC   11/10/2013 3.19 L (68%) 2.16 L (61%)  68%   02/23/2014 3.50 L (76%)  2.23 L (64%)  64%   07/12/2014  2.94 L (66%)  2.02 L (58%)  69%   01/15/2015  2.82 L (62%)  1.79 L (52%)  63%   02/08/2015  3.04 L (66%)  2.06 L (59%)  68%   04/02/2015  2.86 L (63%)  1.83 L (53%)  64%   12/10/2015  2.77 L (62%)  1.62 L (48%)  58%   01/27/2016  2.42 L (54%)  1.55 L (46%)  64%   03/24/2016  3.36 L (75%)  1.99 L (59%)  59%   07/21/2016  2.56 L (57%)  1.54 L (46%)  60%   12/29/2016  2.80 L (63%)  1.57 L (47%)  56%   04/06/2017  2.61 L (59%)  1.46 L (44%)  56%   07/06/2017  2.56 L (58%)  1.49 L (45%)  58%   10/12/2017  2.63 L (59%)  1.47 L (44%)  56%   01/18/2018  2.68 L (63%)  1.36 L (42%)  51%   05/17/2018 2.49 L (57%)  1.25 L (38%)  50%   09/26/2018 2.43 L (57%)  1.26 L (39%)  52%   01/26/2019 2.16 L (51%)  1.16 L (36%)  54%   06/06/2019 2.47 L (59%) 1.11 L (34%) 45%   11/10/19 2.14 L (50%) 1.20 L (37%) 56%   02/13/20 2.01 L (48%) 1.08 L (34%) 54%   05/02/20 2.29 L (54%) 1.11 L (34%) 48%   07/02/20 (pre) 2.11 L (51%) 1.17 L (37%) 56%   07/02/20 (post) 2.26 L (54%) [+7.2%] 1.22 L (38%) [+4.0%] 54%   10/31/20 2.46 L (59%) 1.17 L (37%) 48%   07/01/21 2.33 L (56%) 1.13 L (36%) 48%   09/29/21 1.92 L (47%) 1.01 L (32%) 53%   11/21/21        6-minute Walk (12/06/19): Heart Rate: Resting Heart Rate was 71 bpm. Maximum Heart Rate achieved 97 bpm. Patient did reach target heart rate (  85-132 bpm).  Heart rate returned to 77 bpm after 2 minutes of recovery.  Oxygen Saturation: Resting O2 Saturation was 94% on RA. O2 saturation nadir was 87% at minute 2. O2 saturation improved to 96% with 2L via [place] at minute 3 and maintained saturations above 88% for remainder of walk. O2 saturation returned to 96% on room air after 2 minutes of recovery.  Distance: Total 6 minute walk distance was 320 m. Predicted 6 minute walk distance 548.9 m (LLN of 395.8 m). Percent of predicted 6 minute walk distance of 58%. Testing included one 5-minute pause. Distance obtained after placed on 2L and walked for an additional 6 minutes.  BORG: Resting BORG score was 1. Maximum BORG score was 5 at minute 2. Concluding BORG score was 0 after 2 minutes of recovery. Impression: Significant exercise induced oxygen desaturation (below 88%). Exertional hypoxemia improved with 2L supplemental oxygen. Walk distance is less than expected suggesting a cardio-respiratory defect -- clinical correlation suggested.  Recommendation: Supplemental oxygen required with exercise at 2L . Consider further evaluation with spirometry, lung volumes, respiratory muscle flow/force measurements, cardiopulmonary stress testing, and/or cardio/vascular studies.    6-Minute Walk (02/15/19): Resting Heart Rate was 91. Maximum Heart Rate achieved 119. Patient did reach target heart rate (86-133). Resting O2 Saturation was 98% on RA. Resting BORG score was 1. O2 saturation nadir was 91% at minute 6. Total 6 minute walk distance was 381 meters. Predicted 6 minute walk distance of 558 m (LLN of 43m). Percent of predicted 6 minute walk distance of 68%. Heart rate returned to 96 bpm after 2 minutes of recovery. O2 saturation returned to 96% on RA after 2 minutes of recovery. Maximum BORG score was 7 at minute 6. Concluding BORG score was 2 after 2 minutes of recovery. No evidence of resting or exercise induced hypoxemia. No supplemental oxygen is required at this time.    6-Minute Walk (01/26/19): Resting Heart Rate was 86. Maximum Heart Rate achieved 97. Patient did reach target heart rate (86-133). Resting O2 Saturation was 99% on RA. Resting BORG score was 1. O2 saturation nadir was 95% at minutes 3 and 6. Total 6 minute walk distance was 350 meters. Predicted 6 minute walk distance of 558 m (LLN of 420m). Percent of predicted 6 minute walk distance of 63%. Heart rate returned to 86 bpm after 2 minutes of recovery. O2 saturation returned to 96% on RA after 2 /minutes of recovery. Maximum BORG score was 6 at minutes 5 and 6. Concluding BORG score was 1 after 2 minutes of recovery. No evidence of resting or exercise induced hypoxemia. No supplemental oxygen is required at this time.    Cultures:    Source Bacterial Culture AFB Smear AFB Culture   04/02/15 Sputum 2+ OPF; 1+ Probably mPsA Negative Negative   07/02/15 Sputum 4+ OPF; 3+ mPsA; 3+ sPsA; 1+ Steno Negative Negative   01/23/16 Sputum 2+ OPF; 1+ mPsA; 1+ sPsA - -   03/24/16 Sputum 3+ OPF; 3+ mPsA; 3+ sPsA Negative Overgrown   07/21/16 Sputum 4+ OPF; 4+ mPsA; 2+ sPsA Negative Negative   12/29/16 Sputum 4+ OPF; 3+ mPsA; 3+ sPsA Negative Actinomadura sp   04/06/17 Sputum 3+ OPF; 4+ mPsA Negative Negative   07/06/17 Sputum 4+ OPF; 3+ mPsA Negative Negative   10/12/17 Sputum 4+ OPF; 3+ mPsA Negative Negative   01/18/18 Sputum OPF Negative Negative   05/17/18 Sputum 4+ OPF; 3+ mPsA - -   09/26/18 Sputum 4+ OPF; 3+ mPsA Negative Negative  01/26/19 Sputum 4+ OPF; 2+ mPsA Negative Negative   02/15/19 Sputum 3+ OPF; 3+ mPsA Negative Negative   06/06/19 Sputum 3+ OPF; 3+ sPsA; 3+ mPsA Negative Negative   11/10/19 Sputum 2+ OPF; 3+ mPsA Negative Negative   02/13/20 Sputum OPF Negative Negative   07/02/20 Sputum 3+ OPF; 1+sPsA; 3+ mPsA negative negative   10/31/20 Sputum 4+ OPF; 2+ sPsA; 4+ mPsA negative negative   11/17/20 Sputum 3+ OPF; 3+ mPsA negative negative   02/21/21 Sputum Few PsA negative negative   02/27/21 Sputum Scant C albicans and light A fumigatus - -   03/18/21 Sputum 2+ OPF; 1+ mPsA negative negative   03/26/21 Sputum 4+ OPF; 3+ mPsA negative negative   09/29/21 Sputum 4+ OPF; 4+ sPsA; 4+ mPsA negative NGTD      Aspergillus Ag BAL/serum (04/02/21): 0.55 (@ [facility])     Bronchiectasis Evaluation   IgG with subclasses:   Lab Results   Component Value Date/Time    IGGT 996 04/28/2012 06:21 AM    IGG1 402 04/28/2012 06:21 AM    IGG2 399 04/28/2012 06:21 AM    IGG3 148.0 (H) 04/28/2012 06:21 AM    IGG4 47.7 04/28/2012 06:21 AM     IgA (09/21/11): 283   IgM (09/21/11): 52   IgE:   Lab Results   Component Value Date/Time    IGE 16.6 02/13/2020 10:24 AM     Specific Titers: No results found for: TETANUSAB, TETGV, DIPHTERIAAB, DIPGV, S. pneumonia titers  HIV (05/02/20): Non-reactive  Autoimmune (05/02/20): ANA negative   ANCA: No results found for: ANCA, IFA, PR3ELISA, PR3QT, MPO, MPOQT  CBC-D (05/02/20): 6.1>14.8/44.3<156; ANC 4.1; ALC 1.2; AEC 0.17  Lab Results   Component Value Date/Time    WBC 4.1 11/20/2020 04:03 AM    WBC 6.1 11/19/2020 03:39 AM    WBC 7.7 11/18/2020 03:29 AM    WBC 7.9 08/13/2013 03:57 AM    WBC 9.4 08/12/2013 04:21 AM    WBC 11.7 (H) 08/11/2013 04:04 AM    HGB 11.8 (L) 11/20/2020 04:03 AM    HGB 14.8 03/24/2015 05:31 PM    HCT 34.5 (L) 11/20/2020 04:03 AM    HCT 30.8 (L) 08/13/2013 03:57 AM    PLT 134 (L) 11/20/2020 04:03 AM    PLT 292 08/13/2013 03:57 AM    NEUTROABS 7.8 11/17/2020 02:31 PM    NEUTROABS 3.7 02/13/2020 10:24 AM    NEUTROABS 3.2 04/19/2017 05:45 AM    NEUTROABS 4.9 06/21/2013 09:31 PM    NEUTROABS 4.6 06/21/2013 08:13 PM    NEUTROABS 4.1 04/26/2012 11:54 PM    LYMPHSABS 1.4 11/17/2020 02:31 PM    LYMPHSABS 1.4 02/13/2020 10:24 AM    LYMPHSABS 1.4 (L) 04/19/2017 05:45 AM    LYMPHSABS 1.9 06/21/2013 09:31 PM    LYMPHSABS 1.8 06/21/2013 08:13 PM    LYMPHSABS 2.8 04/26/2012 11:54 PM    EOSABS 0.2 11/17/2020 02:31 PM    EOSABS 0.2 02/13/2020 10:24 AM    EOSABS 0.2 04/19/2017 05:45 AM    EOSABS 0.2 06/21/2013 09:31 PM    EOSABS 0.2 06/21/2013 08:13 PM    EOSABS 0.3 04/26/2012 11:54 PM    BASOSABS 0.0 11/17/2020 02:31 PM    BASOSABS 0.0 06/21/2013 09:31 PM     Alpha-1 level: No results found for: A1TRYP  Alpha-1 genotype (11/11/10): MM  CF Sweat test:   Lab Results   Component Value Date/Time    AMTLFT 195 06/17/2012 11:39 AM    AMTRT 254 06/17/2012  11:39 AM    SWCLL 6 06/17/2012 11:39 AM    SWCLR 7 06/17/2012 11:39 AM     PCD testing: nNO (07/28/13): bilateral mean of 239.95 nl/min, which is Normal.      IgE (05/23/18):               Imaging  Chest CT (11/14/21): Images personally reviewed. I agree with radiology interpretation that he continues to have extensive cavitary consolidation in right apex with areas of dependent soft tissue within medial component of cavity; findings unchanged and c/w mycetoma. Ill-defined nodular consolidation in right posterior lung. S/p resection of right lower lobe. Unchanged extensive bronchiectasis. Mosaic attenuation c/w gas trapping 2/2 small airways disease.     CTA Chest (04/15/21): Images personally reviewed. Sequelae of right middle lobectomy and right lower lobe wedge resection. Chronic consolidation in the medial posterior right upper lobe and right lung apex with volume loss and architectural distortion of the right upper lobe. Compensatory hyperexpansion of the left lung with mosaic attenuation consistent with areas of air trapping. Extensive chronic bilateral bronchiectasis and bronchial thickening. Unchanged diffuse bilateral tree-in-bud nodularity.  Mildly enlarged and tortuous right bronchial artery, predominantly supplying the right upper lung, arising from the lateral descending aorta at the level of T6 (7:58, 4:38). No enlarged left-sided bronchial arteries. No CT evidence of intrapulmonary arteriovenous malformation.     Chest CT (02/25/21): Images personally reviewed.  Stable sequelae of right middle lobectomy and right lower lobe wedge resection. 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 Stable bilateral diffuse extensive bronchiectasis, bronchial wall thickening, and sequelae of chronic endobronchial infection.        CXR (05/02/20): Images not available for review. Per report, Visualized cardiac and mediastinal contours within normal limits allowing for mediastinal shift to the right. Circumferential right pleural fluid/thickening. Volume loss in the right lung consistent with known right lung resection. Irregular opacities throughout both lungs, right greater than left, consistent with known bronchiectasis.     Chest CT (11/10/19): Images personally reviewed. Stable sequelae of right middle lobectomy and right lower lobe wedge resection. Stable consolidation in the medial posterior right upper lobe and right lung apex, bilateral diffuse extensive bronchiectasis and bronchial wall thickening and unchanged sequelae of chronic endobronchial infection.    Chest CT (02/15/19): Images personally reviewed. Stable sequelae of right middle lobectomy and right lower lobe wedge resection.  Unchanged diffuse extensive bronchiectasis and bronchial wall thickening bilaterally.  Diffusely scattered tree-in-bud nodules in bilateral lung parenchyma with interval increase in tree-in-bud nodules in left lung; the constellation of these findings are consistent with sequelae of worsening chronic endobronchial infection.    Chest CT (03/31/16): Images personally reviewed. Status post right middle and right lower lobectomies. Tiny loculated basilar right hydropneumothorax, probably chronic. Moderate cylindrical and varicoid bronchiectasis throughout both lungs, with associated scattered mucoid impaction, tree-in-bud opacities and scattered peribronchovascular and subpleural consolidation in the right upper lobe. 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 Two-vessel coronary atherosclerosis. Nonobstructing right nephrolithiasis.    Modified Barium Swallow (01/17/19): Shallow laryngeal penetration observed with thin liquids and puree.  No aspiration identified with any of the tested consistencies.    Immunization History   Administered Date(s) Administered    COVID-19 VAC,BIVALENT(47YR UP),PFIZER 12/17/2020    COVID-19  VAC,MRNA,TRIS(12Y UP)(PFIZER)(GRAY CAP) 12/01/2019, 05/03/2020    COVID-19 VACC,MRNA,(PFIZER)(PF) 04/20/2019, 05/13/2019, 05/18/2019, 12/01/2019    HEPATITIS B VACCINE ADULT,IM(ENERGIX B, RECOMBIVAX) 05/13/2020, 06/11/2020, 11/13/2020    Hepatitis A 05/13/2020, 11/13/2020    INFLUENZA TIV (TRI) 69MO+ W/ PRESERV (IM) 11/13/2010, 01/07/2012, 12/09/2017, 11/30/2019, 12/17/2020    INFLUENZA TIV (TRI) PF (IM) 02/20/2012, 12/09/2012    Influenza Vaccine Quad (IIV4 PF) 28mo+ injectable 01/31/2016, 12/09/2017    Influenza Virus Vaccine, unspecified formulation 12/25/2014, 12/01/2016    Influenza Whole 12/21/2008    PNEUMOCOCCAL POLYSACCHARIDE 23 10/22/2010, 02/07/2016, 10/07/2019    Pneumococcal Conjugate 13-Valent 04/28/2013    TdaP 03/22/2012, 07/23/2021    ZOSTAVAX - ZOSTER VACCINE, LIVE, SQ 07/03/2014, 06/11/2017, 01/19/2018     Portions of this record have been created using [employer]. Dictation errors have been sought, but may not have been identified and corrected. Quad (IIV4 PF) 78mo+ injectable 01/31/2016, 12/09/2017    Influenza Virus Vaccine, unspecified formulation 12/25/2014, 12/01/2016    Influenza Whole 12/21/2008    PNEUMOCOCCAL POLYSACCHARIDE 23 10/22/2010, 02/07/2016, 10/07/2019    Pneumococcal Conjugate 13-Valent 04/28/2013    TdaP 03/22/2012, 07/23/2021    ZOSTAVAX - ZOSTER VACCINE, LIVE, SQ 07/03/2014, 06/11/2017, 01/19/2018     Portions of this record have been created using [employer]. Dictation errors have been sought, but may not have been identified and corrected.

## 2021-11-21 ENCOUNTER — Ambulatory Visit: Admit: 2021-11-21 | Discharge: 2021-11-22 | Payer: PRIVATE HEALTH INSURANCE

## 2021-11-21 ENCOUNTER — Ambulatory Visit
Admit: 2021-11-21 | Discharge: 2021-11-22 | Payer: PRIVATE HEALTH INSURANCE | Attending: Internal Medicine | Primary: Internal Medicine

## 2021-11-21 DIAGNOSIS — B441 Other pulmonary aspergillosis: Principal | ICD-10-CM

## 2021-11-21 DIAGNOSIS — J479 Bronchiectasis, uncomplicated: Secondary | ICD-10-CM | POA: Diagnosis not present

## 2021-11-21 DIAGNOSIS — Z7983 Long term (current) use of bisphosphonates: Secondary | ICD-10-CM | POA: Diagnosis not present

## 2021-11-21 DIAGNOSIS — R600 Localized edema: Secondary | ICD-10-CM | POA: Diagnosis not present

## 2021-11-21 DIAGNOSIS — I10 Essential (primary) hypertension: Secondary | ICD-10-CM | POA: Diagnosis not present

## 2021-11-21 DIAGNOSIS — G629 Polyneuropathy, unspecified: Secondary | ICD-10-CM | POA: Diagnosis not present

## 2021-11-21 DIAGNOSIS — G609 Hereditary and idiopathic neuropathy, unspecified: Secondary | ICD-10-CM | POA: Diagnosis not present

## 2021-11-21 DIAGNOSIS — R799 Abnormal finding of blood chemistry, unspecified: Secondary | ICD-10-CM | POA: Diagnosis not present

## 2021-11-21 DIAGNOSIS — M810 Age-related osteoporosis without current pathological fracture: Secondary | ICD-10-CM | POA: Diagnosis not present

## 2021-11-21 DIAGNOSIS — Z79899 Other long term (current) drug therapy: Secondary | ICD-10-CM | POA: Diagnosis not present

## 2021-11-21 DIAGNOSIS — I890 Lymphedema, not elsewhere classified: Secondary | ICD-10-CM | POA: Diagnosis not present

## 2021-11-21 DIAGNOSIS — J9611 Chronic respiratory failure with hypoxia: Secondary | ICD-10-CM | POA: Diagnosis not present

## 2021-11-21 DIAGNOSIS — A498 Other bacterial infections of unspecified site: Secondary | ICD-10-CM | POA: Diagnosis not present

## 2021-11-21 DIAGNOSIS — Z7951 Long term (current) use of inhaled steroids: Secondary | ICD-10-CM | POA: Diagnosis not present

## 2021-11-21 DIAGNOSIS — T50905A Adverse effect of unspecified drugs, medicaments and biological substances, initial encounter: Secondary | ICD-10-CM | POA: Diagnosis not present

## 2021-11-21 DIAGNOSIS — Z792 Long term (current) use of antibiotics: Secondary | ICD-10-CM | POA: Diagnosis not present

## 2021-11-21 LAB — BASIC METABOLIC PANEL
ANION GAP: 5 mmol/L (ref 5–14)
BLOOD UREA NITROGEN: 21 mg/dL (ref 9–23)
BUN / CREAT RATIO: 19
CALCIUM: 9.1 mg/dL (ref 8.7–10.4)
CHLORIDE: 106 mmol/L (ref 98–107)
CO2: 28.8 mmol/L (ref 20.0–31.0)
CREATININE: 1.1 mg/dL
EGFR CKD-EPI (2021) MALE: 74 mL/min/{1.73_m2} (ref >=60)
GLUCOSE RANDOM: 92 mg/dL (ref 70–179)
POTASSIUM: 4.3 mmol/L (ref 3.4–4.8)
SODIUM: 140 mmol/L (ref 135–145)

## 2021-11-21 LAB — MAGNESIUM: MAGNESIUM: 1.6 mg/dL (ref 1.6–2.6)

## 2021-11-21 LAB — CYSTATIN C
CYSTATIN C: 1.37 mg/L — ABNORMAL HIGH (ref 0.64–1.23)
EGFR CKD-EPI (2012) CYSTATIN C MALE: 50 mL/min/{1.73_m2} — ABNORMAL LOW (ref >=60)

## 2021-11-21 MED ORDER — FUROSEMIDE 20 MG TABLET
ORAL_TABLET | Freq: Every day | ORAL | 2 refills | 30 days | Status: CP
Start: 2021-11-21 — End: 2022-02-19

## 2021-11-22 DIAGNOSIS — J479 Bronchiectasis, uncomplicated: Secondary | ICD-10-CM | POA: Diagnosis not present

## 2021-11-22 DIAGNOSIS — G4733 Obstructive sleep apnea (adult) (pediatric): Secondary | ICD-10-CM | POA: Diagnosis not present

## 2021-11-22 DIAGNOSIS — J471 Bronchiectasis with (acute) exacerbation: Secondary | ICD-10-CM | POA: Diagnosis not present

## 2021-11-25 ENCOUNTER — Telehealth
Admit: 2021-11-25 | Discharge: 2021-11-26 | Payer: PRIVATE HEALTH INSURANCE | Attending: Hospice and Palliative Medicine | Primary: Hospice and Palliative Medicine

## 2021-11-25 DIAGNOSIS — B441 Other pulmonary aspergillosis: Principal | ICD-10-CM

## 2021-11-25 DIAGNOSIS — J479 Bronchiectasis, uncomplicated: Principal | ICD-10-CM

## 2021-11-25 DIAGNOSIS — R2689 Other abnormalities of gait and mobility: Principal | ICD-10-CM

## 2021-11-25 DIAGNOSIS — J9611 Chronic respiratory failure with hypoxia: Secondary | ICD-10-CM | POA: Diagnosis not present

## 2021-11-25 DIAGNOSIS — Z515 Encounter for palliative care: Secondary | ICD-10-CM | POA: Diagnosis not present

## 2021-11-25 DIAGNOSIS — R053 Chronic cough: Secondary | ICD-10-CM | POA: Diagnosis not present

## 2021-11-25 DIAGNOSIS — R0602 Shortness of breath: Principal | ICD-10-CM

## 2021-11-25 NOTE — Unmapped (Signed)
Outpatient Palliative Care Consult Note    Reason for Consult Request: Goals of Care / Decision Making, Patient and Family Support and advance care planning  Primary Care Provider:  Barbaraann Boys, MD  Referring physician: Dr. Rolm Baptise    Assessment: Jordan [name] is a 66 y.o. male with bronchiectasis with chronic pseudomonas colonization and chronic hypoxemic respiratory failure requiring supplemental O2 who presents to palliative care for patient support, advance care planning and decision-making.       Plan:   # Imbalance/Numbness bottom of feet/LE swelling: worsening   - started lasix per Dr. Garner Nash  - Awaiting neurology appointment    # Poor appetite/Weight Loss/Poor Sleep: Stable.  - Continue mirtazapine 15 mg PO nightly    # Dyspnea/cough: Worsening. Due to bronchiectasis  - Continue management as per Dr. Garner Nash- pulmonary therapies and will be getting new oxygen concentrator.    # Goals of care:  - Understanding of medical condition and prognosis: Knows he has a chronic and progressive disease. He has been told he is not a lung transplant candidate.  - Goals and Priorities: Spend time with family and be able to go to his grandchildren's activities.    # Advance Care Planning  - Surrogate Decision-maker and/or Health care agent:   HCDM (HCPOA): Jordan, [name] - Spouse [phone]   - Advance Directives: He reports having an HCPOA and Living Will    5. Coping/Support:  - Strong family support and strong faith.  - Explored coping and support today given worsening symptoms without diagnosis. He reports some frustrations and concerns. He trusts in Dr. Garner Nash and is grateful that she is helping to connect him with [facility] providers that can hopefully help determine what is going on. He is well supported by family and his faith. Palliative care visit today included focused interview, active listening, therapeutic use of silence, offering support, sharing empathy and summarization of today's discussion as well as goals of care.    6. Care coordination      F/u: Return in about 3 months (around 02/24/2022) for Video Visit.    ---------------------------------------  Interval History:  Reports progressive numbness in b/l feet and now up legs. Also with LE swelling. Worsening cough with episodes of throwing up. Recent PFTs show decline in lung function. He is weaning off of voriconazole.    Palliative Performance Scale: 60% - Ambulation: Reduced / unable to do hobby or some housework, significant disease / Self-Care:Occasional assist as necessary / Intake:Normal or reduced / Level of Conscious: Full or confusion    PHYSICAL EXAM:   Vital signs for this encounter: There were no vitals taken for this visit.  Older man, sitting in chair, NAD. Wearing supplemental O2. Normal work of breathing, no accessory muscle use. Linear thought process. Engages in conversation.        The patient reports they are currently: at home. I spent 18 minutes on the real-time audio and video with the patient on the date of service. I spent an additional 10 minutes on pre- and post-visit activities on the date of service.     The patient was physically located in [place] or a state in which I am permitted to provide care. The patient and/or parent/guardian understood that s/he may incur co-pays and cost sharing, and agreed to the telemedicine visit. The visit was reasonable and appropriate under the circumstances given the patient's presentation at the time.    The patient and/or parent/guardian has been advised of the  potential risks and limitations of this mode of treatment (including, but not limited to, the absence of in-person examination) and has agreed to be treated using telemedicine. The patient's/patient's family's questions regarding telemedicine have been answered.     If the visit was completed in an ambulatory setting, the patient and/or parent/guardian has also been advised to contact their provider???s office for worsening conditions, and seek emergency medical treatment and/or call 911 if the patient deems either necessary.      Darrin Nipper, MD  [name] Palliative [facility]

## 2021-12-03 DIAGNOSIS — Z79899 Other long term (current) drug therapy: Principal | ICD-10-CM

## 2021-12-03 DIAGNOSIS — R1310 Dysphagia, unspecified: Principal | ICD-10-CM

## 2021-12-03 NOTE — Unmapped (Signed)
-----   Message from Edwin Cap, CCC-SLP sent at 11/25/2021  9:45 AM EDT -----  Regarding: Swallowing Program  I am scheduling patient for swallowing evaluation and training. Will be set for Sept 14th at 10:00 a.m. If you wouldn't mind, I just need an amb order for clinical swallowing evaluation. As soon as that is received, will go onto my outpatient schedule.    Thanks.    Edwin Cap

## 2021-12-05 ENCOUNTER — Ambulatory Visit: Admit: 2021-12-05 | Discharge: 2021-12-06 | Payer: PRIVATE HEALTH INSURANCE

## 2021-12-05 DIAGNOSIS — Z8546 Personal history of malignant neoplasm of prostate: Secondary | ICD-10-CM | POA: Diagnosis not present

## 2021-12-05 DIAGNOSIS — N200 Calculus of kidney: Secondary | ICD-10-CM | POA: Diagnosis not present

## 2021-12-05 DIAGNOSIS — I890 Lymphedema, not elsewhere classified: Secondary | ICD-10-CM | POA: Diagnosis not present

## 2021-12-05 DIAGNOSIS — R600 Localized edema: Secondary | ICD-10-CM | POA: Diagnosis not present

## 2021-12-05 DIAGNOSIS — G609 Hereditary and idiopathic neuropathy, unspecified: Secondary | ICD-10-CM | POA: Diagnosis not present

## 2021-12-05 DIAGNOSIS — J479 Bronchiectasis, uncomplicated: Secondary | ICD-10-CM | POA: Diagnosis not present

## 2021-12-05 MED ADMIN — iohexoL (OMNIPAQUE) 350 mg iodine/mL solution 100 mL: 100 mL | INTRAVENOUS | @ 16:00:00 | Stop: 2021-12-05

## 2021-12-09 DIAGNOSIS — J479 Bronchiectasis, uncomplicated: Principal | ICD-10-CM

## 2021-12-09 MED ORDER — TOBRAMYCIN 300 MG/5 ML IN 0.225 % SODIUM CHLORIDE FOR NEBULIZATION
Freq: Two times a day (BID) | RESPIRATORY_TRACT | 5 refills | 28 days | Status: CP
Start: 2021-12-09 — End: 2022-12-09
  Filled 2021-12-16: qty 280, 28d supply, fill #0

## 2021-12-10 DIAGNOSIS — R600 Localized edema: Principal | ICD-10-CM

## 2021-12-10 MED ORDER — FUROSEMIDE 20 MG TABLET
ORAL_TABLET | Freq: Every day | ORAL | 2 refills | 0 days
Start: 2021-12-10 — End: ?

## 2021-12-11 MED ORDER — FUROSEMIDE 20 MG TABLET
ORAL_TABLET | Freq: Every day | ORAL | 2 refills | 30 days | Status: CP
Start: 2021-12-11 — End: ?

## 2021-12-12 ENCOUNTER — Other Ambulatory Visit (HOSPITAL_COMMUNITY): Admit: 2021-12-12 | Payer: PPO

## 2021-12-12 ENCOUNTER — Other Ambulatory Visit (HOSPITAL_COMMUNITY)
Admission: RE | Admit: 2021-12-12 | Discharge: 2021-12-12 | Disposition: A | Discharge status: Home / Self Care | Payer: PPO | Source: Ambulatory Visit | Attending: *Deleted | Admitting: *Deleted

## 2021-12-12 DIAGNOSIS — Z79899 Other long term (current) drug therapy: Secondary | ICD-10-CM | POA: Insufficient documentation

## 2021-12-12 DIAGNOSIS — B449 Aspergillosis, unspecified: Secondary | ICD-10-CM | POA: Insufficient documentation

## 2021-12-12 LAB — CBC WITH DIFFERENTIAL/PLATELET
Abs Immature Granulocytes: 0.02 10*3/uL (ref 0.00–0.07)
Basophils Absolute: 0 10*3/uL (ref 0.0–0.1)
Basophils Relative: 1 %
Eosinophils Absolute: 0.3 10*3/uL (ref 0.0–0.5)
Eosinophils Relative: 5 %
HCT: 41.8 % (ref 39.0–52.0)
Hemoglobin: 14.1 g/dL (ref 13.0–17.0)
Immature Granulocytes: 0 %
Lymphocytes Relative: 21 %
Lymphs Abs: 1.2 10*3/uL (ref 0.7–4.0)
MCH: 31.1 pg (ref 26.0–34.0)
MCHC: 33.7 g/dL (ref 30.0–36.0)
MCV: 92.1 fL (ref 80.0–100.0)
Monocytes Absolute: 0.6 10*3/uL (ref 0.1–1.0)
Monocytes Relative: 10 %
Neutro Abs: 3.5 10*3/uL (ref 1.7–7.7)
Neutrophils Relative %: 63 %
Platelets: 164 10*3/uL (ref 150–400)
RBC: 4.54 MIL/uL (ref 4.22–5.81)
RDW: 11.9 % (ref 11.5–15.5)
WBC: 5.6 10*3/uL (ref 4.0–10.5)
nRBC: 0 % (ref 0.0–0.2)

## 2021-12-12 NOTE — Unmapped (Signed)
[facility] Refill Coordination Note    Specialty Medication(s) to be Shipped:   CF/Pulmonary/Asthma: -[name] Brand (tobramycin 300mg /74mL) inhalation solution  Pt denied all other spec meds  Other medication(s) to be shipped: No additional medications requested for fill at this time     Jordan [name], DOB: [birth year]  Phone: [phone] (home)       All above HIPAA information was verified with patient.     Was a Nurse, learning disability used for this call? No    Completed refill call assessment today to schedule patient's medication shipment from the [facility] [phone]).  All relevant notes have been reviewed.     Specialty medication(s) and dose(s) confirmed: Regimen is correct and unchanged.   Changes to medications: Julienne reports no changes at this time.  Changes to insurance: No  New side effects reported not previously addressed with a pharmacist or physician: None reported  Questions for the pharmacist: No    Confirmed patient received a Conservation officer, historic buildings and a Surveyor, mining with first shipment. The patient will receive a drug information handout for each medication shipped and additional FDA Medication Guides as required.       DISEASE/MEDICATION-SPECIFIC INFORMATION        N/A    SPECIALTY MEDICATION ADHERENCE     Medication Adherence    Specialty Medication: tobramycin (PF) 300 mg/5 mL nebulizer solution ([name])  Patient is on additional specialty medications: Yes  Additional Specialty Medications: colistimethate 150 mg injection (COLYMYCIN) denied   Patient is on more than two specialty medications: Yes  Specialty Medication: dornase alfa 1 mg/mL nebulizer solution (PULMOZYME) denied  Informant: patient  Reliability of informant: reliable            Support network for adherence: family member                    Were doses missed due to medication being on hold? No    tobramycin 300/5 mg/ml: 10 days of medicine on hand     REFERRAL TO PHARMACIST     Referral to the pharmacist: Not needed      [facility]     Shipping address confirmed in Epic.     Delivery Scheduled: Yes, Expected medication delivery date: 12/17/21.     Medication will be delivered via UPS to the prescription address in Epic WAM.    Quintella Reichert   [facility] Specialty Technician

## 2021-12-16 DIAGNOSIS — J449 Chronic obstructive pulmonary disease, unspecified: Secondary | ICD-10-CM | POA: Diagnosis not present

## 2021-12-16 DIAGNOSIS — G4733 Obstructive sleep apnea (adult) (pediatric): Secondary | ICD-10-CM | POA: Diagnosis not present

## 2021-12-16 DIAGNOSIS — J479 Bronchiectasis, uncomplicated: Secondary | ICD-10-CM | POA: Diagnosis not present

## 2021-12-17 NOTE — Unmapped (Signed)
[facility] SOLIN has been contacted in regards to their refill of colistimethate 150 mg injection (COLYMYCIN). At this time, they have declined refill due to  alternates monthly between tobramycin and colistimethate. Tobramycin filled 12/16/21. Tolulope Lucky will not need refill of colistimethate until November 1,2023 . Refill assessment call date has been updated per the patient's request.

## 2021-12-18 LAB — VORICONAZOLE, SERUM: Voriconazole, Serum: <0.3 ug/mL

## 2021-12-21 NOTE — Unmapped (Signed)
DIVISION OF CARDIOLOGY  [facility] [place]        Date of Service: 12/22/2021      PCP: Referring Provider:   Irving Copas, MD  [address] [place] [address]  Phone: [phone]  Fax: [phone] Truett Mainland, MD  [address]  [address]  [place],  [place] [address]  Phone: [phone]  Fax: [phone]     Assessment and Plan:   # Paroxysmal Supraventricular Tachycardia  # Palpitations  # Possible Afib vs. MAT  Jordan [name] is a 67 year old M with extensive pulmonary disease; bronchiectasis, chronic pseudomonal colonization, asperilloma, chronic hypoxic respiratory failure. CV risk factors include HTN, OSA, age, male gender.  He has a prior dx of PSVT (AFib) late 2022 while in pulm rehab wearing a monitor.  He is on Dilt 120 ER for BP control and not rhythm control to his knowledge.  With significant lung disease and chronic hypoxic respiratory failure, there is likely underlying pulmonary HTN and resultant atrial stretch. This can certainly trigger SVTs as well as MAT. Unclear what the rhythm is as it has not been documented on ECG or Zio.     Will evaluation with both TTE (structural evaluation) and a Zio patch to assess burden fo PSVT and to obtain better characterization of PSVT. If AV blockade is not tolerated or ineffective in manageing symptoms, we can refer to our EP group for consideration of evaluation.     At this point in time, if this is discovered to be AFib, anticoagulation is not benign in Jordan [name]. He has has a history of PA embolization Dec 2022 and would likely be at elevated risk of bleeding as he regorws collaterals and has ongoing progression of his lung disease. After shared decision making, we agreed to hold off on anticoagulation is this were to be AF in the setting of an elevated bleed risk with recent bleed within the year and a low Chads 2 Vasc of 2 (age, HTN). It is very possibly that this SVT found during pulm rehab monitoring was MAT instead of Afib. Zio will help delineate this.   Plan:  -TTE  -Zio  -Continue Dilt 120 ER for HTN and addition of AV nodal blockade.     # Lower extremity Edema  # Shortness of breath on exertion  # Chronic hypoxic respiratory failure on 2L at night and w/ activity  # Severe bronchiectasis and pseudomonal colonization.   # Likely R heart disease and Pulmonary HTN.   Chronic lower extremity edema that has worsened over the last 5 months steadily. He has a 12 lb wt gain and intermittent PND as well. With his severe pulmonary disease and chronic hypoxic respiratory failure, I have elevated concern for Right heart disease. We will obtain a TTE and BNP today to assess. No signs of L heart disease, notes he was previously quite active and never endorsed any chest tightness or chest pain. Could consider RHC if TTE has concerning findings to assess for pre vs. Post-capillary vs. Mixed pulmHTN.  I counseled that his R heart is likely under significant stress with his CHRF and severe bronchiectasis.  It will be imperative to keep him stable from a lung point of view to prevent any exacerbations or cause further damage to the R heart.    Plan:  -TTE  -BNP and BMP.   -Continue lasix 20. If 3+ lbs a day or 5 lbs in a  week, double for 2-3 days and assess response.   -Counseled on 2L water restriction an 2g Na / decreasing sodium in diet.     Primary prevention parameters:  - The 10-year ASCVD risk score (Arnett DK, et al., 2019) is: 21%    Values used to calculate the score:      Age: 2 years      Sex: Male      Is Non-Hispanic African American: No      Diabetic: No      Tobacco smoker: No      Systolic Blood Pressure: 138 mmHg      Is BP treated: Yes      HDL Cholesterol: 46 mg/dL      Total Cholesterol: 217 mg/dL    Note: For patients with SBP <90 or >200, Total Cholesterol <130 or >320, HDL <20 or >100 which are outside of the allowable range, the calculator will use these upper or lower values to calculate the patient???s risk score.    - No results found for: LDL    - No results found for: A1C    -   Lab Results   Component Value Date    CREATININE 1.10 11/21/2021       No follow-ups on file.      Subjective:        Reason for Consultation: PSVT    History of Present Illness: Jordan [name] is a very pleasant 66 y.o. male with a history of HTN, OSA, brochiectasis c/b chronic pseudomonas colonization and chronic hypoxic respiratory failure req 2L O2 with activity, history of RML lobectomy/RLL wedge recection, RUL aspergillooma .The patient is seen at the request of Truett Mainland for evaluation of PSVT (Afib) which was discovered late 2022 during pulmonary rehab.    Jordan [name] follows with Dr. Garner Nash (Pulmonology), who started him on lasix for LE swelling. He recently has been reporting progressive numbness in b/l feet and now up to his legs of unclear etiology. Feels his edema has been slowly worsening as well and may correlate with his slowly progressive respiratory status decline as well.        RPA embolization in December 2022 for hemoptysis.       Cardiology is asked to see Jordan [name] for a history of paroxysmal SVT.  Unclear which SVT this is as it has not been captured on Zio or ECG.  Recent CT-A Chest 03/2021 demonstrated extensive bronchiectasis and signs of ongoing endobronchial infection.  Per chart review, PSVT managed with Dilt ER 120mg . While in pulmonary rehab 8/11, notes of 40 events of fast rate or afib.      RX: Atorvastatin 10, Lasix 20 every day,     Wore a heart monitor in upulm rehab. Showed AFib.   Echo showed dilated aorta  October 3rd. Dilt ER 120.     LE swelling began in April of 2023. Both legs, the L one is much worse than the right. All 10 of the toes are partially numb and the balls of the feet are numb as well. Numbness    No HF or CHF. He ises 2-3 thin pillows at night and has not changed them recently. +PND.  With oxygen he is able to do yard work. Even with O2 he has to takes breaks and has it down to 87%. 2L at home with activity. No CP or Chest heaviness.     He is unaware that he is in afib. He would see the  pulm rehab monitor tell him he was in Afib.  His most recent hospital stays he was not in AFib and his heart rate was always below 90s. No Paplps, or pre-syncoal features.      Cardiovascular History:  PSVT, unclear type. Likely AFib vs. MAT.     Fam Hx:  Father - CHF (late 78s).   Mother - AFib    I have independently reviewed of all the diagnostic studies. I have reviewed the old medical records from [facility] prior to this clinic visit.     Cardiovascular Studies Date Comments     ECG 12/22/2021 NSR. LAFB.   Echo     Stress test     Cardiac catheterization     [id number] Genotype     Electrophysiology      Cardiovascular Surgery     Peripheral Vascular Studies     I have personally reviewed the recent imaging studies on this patient.    Past medical history:  Patient Active Problem List   Diagnosis    Bronchiectasis (CMS-HCC)    Esophagitis    Pseudomonas aeruginosa infection    Daytime somnolence    Memory loss    Hypersomnia with sleep apnea    Essential hypertension    Osteoarthrosis    Dizziness    Excessive daytime sleepiness    Sleep disturbances    Loculated pleural effusion    Moderate Pharyngeal dysphagia    IDA (iron deficiency anemia)    Disequilibrium    Gastroesophageal reflux disease without esophagitis    Hoarseness    Dyspnea on exertion    Chronic respiratory failure with hypoxia (CMS-HCC)    Hemoptysis    Pulmonary aspergilloma (CMS-HCC)       Medications:   Patient's Medications   New Prescriptions    No medications on file   Previous Medications    ALBUTEROL 2.5 MG /3 ML (0.083 %) NEBULIZER SOLUTION    INHALE 3 ML (2.5 MG TOTAL) BY NEBULIZATION TWO (2) TIMES A DAY.    ALBUTEROL HFA 90 MCG/ACTUATION INHALER    Inhale 2 puffs Four (4) times a day.    ALCOHOL SWABS (ALCOHOL PREP PADS) PADM    Use as directed with inhaled antibiotics    ALENDRONATE (FOSAMAX) 70 MG TABLET    TAKE 1 TABLET BY MOUTH EVERY 7 DAYS FOR 12 DOSES    ASCORBIC ACID, VITAMIN C, (VITAMIN C) 1000 MG TABLET    Take 1 tablet (1,000 mg total) by mouth daily.    ATORVASTATIN (LIPITOR) 10 MG TABLET    Take 1 tablet (10 mg total) by mouth daily.    AZITHROMYCIN (ZITHROMAX) 250 MG TABLET    TAKE 1 TABLET BY MOUTH EVERY DAY    BUDESONIDE-FORMOTEROL (SYMBICORT) 80-4.5 MCG/ACTUATION INHALER    Inhale 2 puffs Two (2) times a day.    CALCIUM CARBONATE (CALCIUM 600 ORAL)    Take 2 tablets by mouth two (2) times a day. 1 in morning and 1 in the evening    CHOLECALCIFEROL, VITAMIN D3-50 MCG, 2,000 UNIT,, 50 MCG (2,000 UNIT) CAP    Take 1 capsule (50 mcg total) by mouth daily.    COENZYME Q10 300 MG CAP    Take 10-300 mg by mouth daily.    COLISTIMETHATE (COLYMYCIN) 150 MG INJECTION    Inject 2 mL sterile water for injection to mix colistin vial, then draw up 2 mL (150mg ) and inhale 2 times a day, 28 days on and 28 days off.  DORNASE ALFA (PULMOZYME) 1 MG/ML NEBULIZER SOLUTION    Inhale 1 ampule (2.5 mg) daily. Use at least 30-60 minutes before airway clearance, or after airway clearance    EMPTY CONTAINER (SHARPS CONTAINER) MISC    USE AS DIRECTED    EMPTY CONTAINER MISC    USE AS DIRECTED    FERROUS SULFATE 325 (65 FE) MG TABLET    Take 1 tablet (325 mg total) by mouth Two (2) times a day. BID    FUROSEMIDE (LASIX) 20 MG TABLET    TAKE 1 TABLET(20 MG) BY MOUTH DAILY    HYDROCODONE-ACETAMINOPHEN (NORCO) 7.5-325 MG PER TABLET    Take 1 tablet by mouth three (3) times a day (at 6am, noon and 6pm).    MIRTAZAPINE (REMERON) 15 MG TABLET    Take 1 tablet (15 mg total) by mouth nightly.    MONTELUKAST (SINGULAIR) 10 MG TABLET    TAKE 1 TABLET BY MOUTH EVERY DAY AT NIGHT    MULTIVITAMIN-MINERALS-LUTEIN TAB    Take 1 tablet by mouth daily.     NEBULIZERS (LC PLUS) MISC    Use with inhaled medications    NEEDLE, DISP, 21 G (BD REGULAR BEVEL NEEDLES) 21 GAUGE X 1 1/2 NDLE    Use as directed with inhaled Colistin    OMEPRAZOLE (PRILOSEC) 20 MG CAPSULE    Take 1 capsule (20 mg total) by mouth Two (2) times a day.    SODIUM CHLORIDE (BD POSIFLUSH NORMAL SALINE 0.9) 0.9 % INJECTION    Inject 2mL of 0.9%NaCl into colistin vial & gently mix. After withdrawing colistin dose, add an additional 1mL of 0.9%NaCl to neb cup with the colistin dose.    SODIUM CHLORIDE 10 % NEBU    Measure and Inhale 5 mL by nebulization Two (2) times a day. Discard remaining amount and use a new vial for each dose.    STERILE WATER SOLN    Use 2mL to mix Colistin, then add additional 2mL to neb cup with 2mL of mixed colistin. Inhale twice daily 28 days on and 28 days off.    SYRINGE WITH NEEDLE (BD LUER-LOK SYRINGE) 3 ML 20 GAUGE X 1 1/2 SYRG    For use with inhaled antibiotic (Colistin)    SYRINGE WITH NEEDLE (BD LUER-LOK SYRINGE) 3 ML 21 GAUGE X 1 SYRG    For use with inhaled antibiotic (Colistin)    TIOTROPIUM BROMIDE (SPIRIVA RESPIMAT) 2.5 MCG/ACTUATION INHALATION MIST    Inhale 2 puffs daily.    TOBRAMYCIN, PF, ([name]) 300 MG/5 ML NEBULIZER SOLUTION    Inhale the contents of 1 ampule (300 mg total) by nebulization every twelve (12) hours. 28 days on and 28 days off.    ZINC GLUCONATE 50 MG (7 MG ELEMENTAL ZINC) TABLET    Take 1 tablet (50 mg total) by mouth.   Modified Medications    No medications on file   Discontinued Medications    No medications on file       Allergies:  Allergies   Allergen Reactions    Gentamicin Other (See Comments)     BALANCE ISSUES    Tobramycin Other (See Comments)     ototoxicity    Morphine Nausea And Vomiting       Social History:  He  reports that he has never smoked. He has never used smokeless tobacco. He reports that he does not drink alcohol and does not use drugs.    Family History:  His family history includes Alzheimer's  disease in his mother; Asthma in his son; Bronchiectasis  in his brother, brother, and daughter; Diabetes in his father; Heart failure in his father; Liver disease in his father; Stroke in his mother.    Review of Systems  10 systems were reviewed and negative except as noted in HPI.      Objective:       Physical Exam  There were no vitals taken for this visit.   Wt Readings from Last 3 Encounters:   11/21/21 86.1 kg (189 lb 12.8 oz)   10/14/21 78.9 kg (174 lb)   07/11/21 79.4 kg (175 lb)       General:  Alert, no distress.   Eyes:  Intact, sclerae anicteric.   Neck: No carotid bruit.    Respiratory:   CTAB bilaterally with normal WOB.   Cardiovascular:  No carotid bruit, JVD 5 cm at 90 degrees, RRR without m/r/g.  1+ edema bilaterally.       Most recent labs   Lab Results   Component Value Date    Sodium 140 11/21/2021    Sodium Whole Blood 137 03/24/2015    Potassium 4.3 11/21/2021    Potassium, Bld 3.9 03/24/2015    Chloride 106 11/21/2021    Chloride 93 (L) 08/13/2013    CO2 28.8 11/21/2021    CO2 32 (H) 08/13/2013    BUN 21 11/21/2021    BUN 24 (H) 08/13/2013    Creatinine Whole Blood, POC 1.2 04/15/2021    Creatinine Whole Blood, POC 0.4 (L) 06/12/2014    Creatinine 1.10 11/21/2021    Magnesium 1.6 11/21/2021    Magnesium 2.0 08/13/2013     Lab Results   Component Value Date    HGB 11.8 (L) 11/20/2020    Hemoglobin 14.8 03/24/2015    MCV 90.4 11/20/2020    MCV 88 08/13/2013    Platelet 134 (L) 11/20/2020    Platelet 292 08/13/2013     Lab Results   Component Value Date    TSH 1.334 10/14/2021    INR 0.95 04/14/2017    INR 1.0 08/03/2013          A total of 50 minutes was spent on this visit reviewing previous notes, counseling the patient on this clinical conditions, ordering tests , adjusting meds, and documenting the findings in the note.       Patient note was created using dragon dictation. Any errors in syntax or proofreading may not have been identified and edited on initial review prior to signing this note.

## 2021-12-22 ENCOUNTER — Other Ambulatory Visit: Payer: PPO

## 2021-12-22 ENCOUNTER — Ambulatory Visit: Admit: 2021-12-22 | Discharge: 2021-12-22 | Payer: PRIVATE HEALTH INSURANCE

## 2021-12-22 DIAGNOSIS — R609 Edema, unspecified: Principal | ICD-10-CM

## 2021-12-22 DIAGNOSIS — G4733 Obstructive sleep apnea (adult) (pediatric): Secondary | ICD-10-CM | POA: Diagnosis not present

## 2021-12-22 DIAGNOSIS — I471 PSVT (paroxysmal supraventricular tachycardia): Principal | ICD-10-CM

## 2021-12-22 DIAGNOSIS — J479 Bronchiectasis, uncomplicated: Secondary | ICD-10-CM | POA: Diagnosis not present

## 2021-12-22 DIAGNOSIS — R0602 Shortness of breath: Secondary | ICD-10-CM | POA: Diagnosis not present

## 2021-12-22 DIAGNOSIS — J471 Bronchiectasis with (acute) exacerbation: Secondary | ICD-10-CM | POA: Diagnosis not present

## 2021-12-22 DIAGNOSIS — Z79899 Other long term (current) drug therapy: Secondary | ICD-10-CM | POA: Diagnosis not present

## 2021-12-22 LAB — BASIC METABOLIC PANEL
ANION GAP: 6 mmol/L (ref 5–14)
BLOOD UREA NITROGEN: 18 mg/dL (ref 9–23)
BUN / CREAT RATIO: 19
CALCIUM: 8.9 mg/dL (ref 8.7–10.4)
CHLORIDE: 105 mmol/L (ref 98–107)
CO2: 29 mmol/L (ref 20.0–31.0)
CREATININE: 0.93 mg/dL
EGFR CKD-EPI (2021) MALE: 90 mL/min/{1.73_m2} (ref >=60)
GLUCOSE RANDOM: 88 mg/dL (ref 70–179)
POTASSIUM: 4 mmol/L (ref 3.4–4.8)
SODIUM: 140 mmol/L (ref 135–145)

## 2021-12-22 LAB — B-TYPE NATRIURETIC PEPTIDE: B-TYPE NATRIURETIC PEPTIDE: 52 pg/mL (ref <=100)

## 2021-12-22 NOTE — Unmapped (Signed)
It was nice meeting you today!    Please continue taking your lasix 20 mg daily. We will get a heart ultrasound to look for signs of  heart failure.    We will check your blood work today as well.     No need for a heart monitor. Continue your Diltiazem 120 ER. No indication for Anticoagulation at this time, the risk of bleeding is quite high and outweighs the potential benefit.

## 2021-12-23 DIAGNOSIS — Z79899 Other long term (current) drug therapy: Principal | ICD-10-CM

## 2021-12-23 LAB — MAGNESIUM: MAGNESIUM: 1.5 mg/dL — ABNORMAL LOW (ref 1.6–2.6)

## 2021-12-24 DIAGNOSIS — I471 Supraventricular tachycardia, unspecified: Secondary | ICD-10-CM | POA: Diagnosis not present

## 2021-12-29 NOTE — Unmapped (Signed)
I reviewed the history, physical findings and diagnostic studies, discussed the findings, assessment and plan with the Resident and agree with the recommendation as documented in the Resident's note.  - Thekla Colborn H Hellon Vaccarella, MD, FACC

## 2021-12-30 ENCOUNTER — Ambulatory Visit: Admit: 2021-12-30 | Discharge: 2021-12-31 | Payer: PRIVATE HEALTH INSURANCE

## 2021-12-30 DIAGNOSIS — R609 Edema, unspecified: Secondary | ICD-10-CM | POA: Diagnosis not present

## 2021-12-30 DIAGNOSIS — I471 Supraventricular tachycardia, unspecified: Secondary | ICD-10-CM | POA: Diagnosis not present

## 2021-12-30 DIAGNOSIS — I351 Nonrheumatic aortic (valve) insufficiency: Secondary | ICD-10-CM | POA: Diagnosis not present

## 2021-12-30 DIAGNOSIS — R0602 Shortness of breath: Secondary | ICD-10-CM | POA: Diagnosis not present

## 2021-12-31 ENCOUNTER — Ambulatory Visit: Payer: PPO | Admitting: Cardiology

## 2022-01-02 DIAGNOSIS — R1310 Dysphagia, unspecified: Principal | ICD-10-CM

## 2022-01-05 ENCOUNTER — Ambulatory Visit: Admit: 2022-01-05 | Discharge: 2022-01-05 | Payer: PRIVATE HEALTH INSURANCE

## 2022-01-05 DIAGNOSIS — G609 Hereditary and idiopathic neuropathy, unspecified: Principal | ICD-10-CM

## 2022-01-05 DIAGNOSIS — G5783 Other specified mononeuropathies of bilateral lower limbs: Secondary | ICD-10-CM | POA: Diagnosis not present

## 2022-01-05 DIAGNOSIS — M5417 Radiculopathy, lumbosacral region: Secondary | ICD-10-CM | POA: Diagnosis not present

## 2022-01-05 DIAGNOSIS — E0840 Diabetes mellitus due to underlying condition with diabetic neuropathy, unspecified: Secondary | ICD-10-CM | POA: Diagnosis not present

## 2022-01-05 DIAGNOSIS — G629 Polyneuropathy, unspecified: Secondary | ICD-10-CM | POA: Diagnosis not present

## 2022-01-05 DIAGNOSIS — R1310 Dysphagia, unspecified: Secondary | ICD-10-CM | POA: Diagnosis not present

## 2022-01-05 LAB — HEMOGLOBIN A1C
ESTIMATED AVERAGE GLUCOSE: 105 mg/dL
HEMOGLOBIN A1C: 5.3 % (ref 4.8–5.6)

## 2022-01-05 NOTE — Unmapped (Signed)
Outpatient Neurology Consult Note     MMNT [address]  [facility] [place]  [address]  [place] [address]  [phone]    Date: 01/05/22  Patient Name: Jordan [name]  MRN: [id number]  PCP: Irving Copas, MD      ASSESSMENT        Mr. Guia is a pleasant 66 year old man that presents today with complaints of progressive numbness and pain in both of his feet, L>R. His symptoms first appeared in April beginning with the numbness and pain in his toes and has progressed to include the balls of his feet and his ankles as well. He does not have weakness or loss of functional mobility. He did have a prolonged  treated with Voriconazole for a fungal lung infection from January of this year through July. His symptoms have not worsened since July. On exam, he does not have any motor weakness but does have reduced pinprick sensation, reduced vibration sense, and reduced temperature sensation. His achilles reflexes are absent but all other reflexes are intact. 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            PLAN       We discussed the possibility that he has a toxic neuropathy from the use of Voriconazole but we will rule out other causes as well. He will get an EMG/NCS to assess for the presence of neuropathy. We will also get additional labs today to look for underlying causes of neuropathy. We discussed possibly using Gabapentin to help with the symptoms but we will hold off for now. Patient reports that he doesn't feel like he needs medication at this time. We also discussed performing a skin biopsy to assess for small fiber neuropathy. We discussed that if his symptoms worsen the patient should let us know. Otherwise, we will follow up as needed.     Today, I personally spent 70 minutes face-to-face and non-face-to-face in the care of this patient, which includes all pre, intra, and post visit time on the date of service.      The patient was told to call back at [phone] in 2-3 weeks to discuss the results of testing and need for further visits or consultation.     Orders Placed This Encounter   Procedures    Epidermal Nerve Fiber Density    Vitamin B6    Encephalopathy, Autoimmune/Paraneoplastic Eval, Serum    Hemoglobin A1c            HISTORY       Chief Concern: MIRON Jordan [name] is a 66 y.o. male who comes for consultation and evaluation from Abigail Miyamoto, Maryjane Hurter, MD regarding NEW NEUROMUSCULAR  .    History of Present Illness: The history is obtained from the medical record, patient, and family including his wife, who is here with patient today.    Patient reports that back in April, he started experiencing swelling in both of his feet, L>R. About a month later he started experiencing numbness in his toes, L>R. He reports buzzing pain with ambulation in the toes. He denies weakness in his feet. It did not affect his ability to walk or carry out his daily activities. This was bothering him all day, in the morning his feet were still swollen and numbness and buzzing pain were there as well. These  symptoms were stable for a couple of months. The numbness started getting worse in June, he was experiencing numbness in the balls of his feet as well. Then in July, the numbness had reached his ankles. The numbness in his ankles comes and goes but the numbness in the toes and balls of his feet is constant. The buzzing pain in his toes is constant, comes and goes in the balls of his feet and ankles. The symptoms in his feet continue to be worse in the left and the right. The numbness does wake him up at night and when this occurs he gives it a 5 or 6 on a scale of 0-10 for severity. He has not tried taking any medications for the pain. His symptoms have not progressed since July. Patient reports that the symptoms are minor on the right compared to the left. He is still able to walk without assistive devices, just slower than he was but he reports this is more due to his progressive lung disease. Denies muscle cramps or spasms in his feet. He reports that this problem does not cause any functional problems, just very aggravating.    He does recall that he was placed on Voriconazole in January for a fungal infection in his lungs and this was tapered off by July. His physician was concerned that this was causing the numbness and pain in his feet.    His pulmonologist started him on Lasix 20 mg daily on August 23rd to try and help with the swelling. He did notice a slight improvement in swelling but no improvement in the numbness and pain. He continues to take the Lasix 20 mg daily. He had an echo done recently as well due to concern for right sided heart dysfunction and this was normal. He also noticed in August that his 4th and 5th toes on his left foot starting curving inward.    Does have a history of back pain both in his neck and lower back, had surgeries for both of these areas in the past, cannot recall the year but says when I was young.     Outside records and prior workup has been reviewed (see Media section).     Past Medical History: He  has a past medical history of Abscess of lung (CMS-HCC) (11/06/2010), Arthritis, Biceps tendon tear (2013), Bronchiectasis (CMS-HCC), Degenerative joint disease of left acromioclavicular joint, GERD (gastroesophageal reflux disease), Hypertension, Obstructive sleep apnea on CPAP (03/14/13), Pneumonia (2012), PONV (postoperative nausea and vomiting), Prostate cancer (CMS-HCC) (09/09/2017), Rotator cuff injury (left), and Vertigo.  Sepsis in 2019 (rectal growth lanced, developed a bloodstream infection, required a hospital stay for 4 days)    Surgical History:  - Perirectal abscess removal 2019  - Prostatectomy 2019  - Several back surgeries  - Tonsilectomy, younger age  - Appendectomy  - Partial right lobectomy 2014  - Additional right lobectomy 2015    Family History: His family history includes Alzheimer's disease in his mother; Asthma in his son; Bronchiectasis  in his brother, brother, and daughter; Diabetes in his father; Heart failure in his father; Liver disease in his father; Stroke in his mother.    Social history:  - Does not work, has been disability since 2011 - retired from being a Consulting civil engineer  - Lives at home with his wife  - Does not drink alcohol  - Never smoked  - Does not do drugs    He  reports that he has never smoked. He has  never used smokeless tobacco. He reports that he does not drink alcohol and does not use drugs.     Medications: He is on the following medications: has a current medication list which includes the following prescription(s): albuterol, albuterol, alcohol swabs, alendronate, ascorbic acid (vitamin c), atorvastatin, azithromycin, budesonide-formoterol, calcium carbonate, coenzyme q10, colistimethate, dornase alfa, empty container, empty container, ferrous sulfate, furosemide, hydrocodone-acetaminophen, mirtazapine, montelukast, lc plus, bd regular bevel needles, omeprazole, oxygen-air delivery systems, sodium chloride, sodium chloride, sterile water, syringe with needle, bd luer-lok syringe, spiriva respimat, tobramycin (pf), zinc gluconate, cholecalciferol (vitamin d3-50 mcg (2,000 unit)), and multivitamin-minerals-lutein.    Medication Reconciliation: In conjunction with entering the patient's initial or transfer medications, I performed medication reconciliation based on information available to me at the time.    Allergies: He is allergic to gentamicin, tobramycin, and morphine.    Review of Systems: 11+ review of systems was reviewed. Please see scanned document for details. Significant positives and negatives are detailed in the HPI.     Data Review:     Labs:    12/22/21 - BMP, magnesium unremarklable    10/14/21 - SPEP, TSH, free T4, BNP all unremarkable, CMP significant for elevated potassium (4.9), Vitamin B12 elevated (>2000)    Imaging:    02/04/1998 - Myelogram Lumbar Spine - mild disc bulge L2-L5, Prior right laminotomy in L5-S1, mild disc bulge in L5-S1    05/30/1998- MRI Lumbar Spine - mild disc bulging L2-L5, L5-S1 with prior R sided hemilaminectomy, right sided facet joint degenerative joint changes. Mild bilateral foraminal narrowing, R>L, encroachment of exiting L5 nerve root but not compressed.                EXAMINATION     Vital Signs :His height is 174 cm (5' 8.5) and weight is 86.4 kg (190 lb 6.4 oz). His blood pressure is 133/67 and his pulse is 83.   Pain score-       01/05/22 0833   PainSc: 2    /10    Physical Examination:  General: Well developed, well-nourished in no acute distress.  HEENT: normocephalic, atraumatic, no oral lesions or tongue lacerations, mucous membranes are moist  CV: RRR with no murmer appreciated.  Lungs: CTA bilaterally, no significant wheezes, rhonchi, or rales.  Ext: No edema, nontender, no ulcer. Warm, well perfused. There is no evidence of pes   cavus or hammer toes.   Skin: No significant rashes.  Neck: Supple.     Neurological Examination:    Mental Status:   Patient is awake, alert, and appropriately oriented. Attentive to examiner, cooperative with exam. Language is fluent with normal comprehension and repetition. Fund of knowledge is normal. Memory is intact. Patient is able to give details of his own history.  Speech is normal without any evidence of dysarthria.    Cranial nerves:    II: Visual fields are full. Pupils are equal, round and reactive to light.  III, IV and VI: extraocular movements are full. There is no nystagmus.  V: Facial sensation is intact and symmetric.  VII: The face is strong and symmetric.  VIII: Hearing is grossly intact.  IX and X: Soft palate elevates symmetrically in the midline.  XI: Shoulder shrug and sternocleidomastoids are strong.  XII: Tongue is midline, normal movement, no atrophy or fasciculations.    Motor Examination:   Muscle tone is normal.  There is no evidence of atrophy or fasciculations.    Strength evaluation:      MUSCLE Right (  MRC Grade) Left (MRC  Grade) Right (lbs) Left (lbs)   Neck Flexor 5      Neck Extensor 5      Arm abductor 5 5     Elbow extensor 5 5     Elbow flexor  5 5     Wrist extensor 5 5     Wrist flexor 5 5     Finger flexor  5 5     Finger extensor 5 5     Finger abduction 5 5     Thumb abduction 5 5     Hip flexor  4+ 4+     Hip extensor  5 5     Knee extensor  5 5     Knee flexor  5 5     Ank Dorsiflexor  5 5     Ank Plantarflexor  5 5     Toe Extensors       Toe Flexors       Other       Reflexes:    Reflexes Right Left Comments   Biceps 2+ 2+    Triceps 2+ 2+    Brachioradialis 2+ 2+    Patella 2+ 2+ brisk   Achilles 0 0    Plantar Response Flexor Flexor      Sensory:  Pinprick sensation is reduced on both feet and toes - 10% on his R great toe and 50% on L great toe  Temperature sensation is reduced in the feet  Vibration sense R great toe severely reduced, absent in R 5th toe, L great toe mildy reduced, L 5th toe absent, present in ankles, knees, and fingers  Proprioception is normal at the toes.  There is no sensory level noted.    Coordination and Gait:  Finger to nose is intact without dysmetria or tremors. Rapid alternating movements are intact without dysrhythmia. Fine motor control is normal.     Romberg sign is absent.    Gait is wide-based, favoring left side, slow arm swing. Tandem gait is absent. He is not able to rise on toes and heels.  He is not able to get up from a low chair without difficulty.       ATTENDING ADDENDUM       I personally took the key elements of the history and performed the pertinent elements of the physical examination after the patient was presented by NP Johnney Ou.  The note above was edited by me and insertions were made when needed in the history, examination, final assessment and management plan.  I made the medical decisions and formulated the assessment and plan noted above.    This 66 year old man who has a long history of bronchiectasis (status post lobectomy the right lung), on long-term oxygen with repeated infections, recently with voriconazole (January to July) noted paresthesias in his feet left worse than right starting in April.   Discomfort in the outer 2 toes of the left foot is also reported. Examination shows distal pinprick loss to the ankle, distal vibratory loss at the toes, absent reflexes at the ankle, and relatively preserved strength.  He also has swelling in his left foot.  He appears to have predominantly sensory neuropathy in a length dependent fashion likely induced by voriconazole toxicity although other causes will be excluded.  Additionally, superimposed lumbosacral radiculopathy on the left cannot be excluded.  Nerve conduction study and skin biopsy are planned to determine the presence and severity of the large and  small fiber sensory neuropathy.  Work-up for other causes of neuropathy is also being done as noted above.  Imaging studies of the lumbar spine may be needed if there is evidence of radiculopathy on EMG.  ?     Please note that portions of this note may be dictated using Dragon natural speaking voice recognition software.  Variances in spelling and vocabulary are possible and unintentional.

## 2022-01-06 NOTE — Unmapped (Signed)
[facility]  Clinical Neurophysiology Laboratory  [place], [place]         Patient: Jordan [name] Date of Birth: 1955-06-09  [facility] #: [id number] Handedness: Right  Sex: Male      Visit Date: 01/05/2022 1:00 PM  Age: 66 Years  Attending: Gasper Sells, MD   Req Provider: Gasper Sells, MD   EMG Tech: Blanche East   Current Height: 5 feet 7 inch  Reason for referral: This 66 year old man is being evaluated for paresthesias in his feet.    Motor NCS      Nerve / Sites Muscle Latency Ref. Amplitude Ref. Dur. Distance Lat Diff Velocity Ref.     ms ms mV mV ms cm ms m/s m/s   R COMMON FIBULAR (PERON) - EDB      Ankle EDB 4.42 ?5.60 2.4 ?2.0 6.38 7.5         B. Fib Head EDB 11.85  2.1  6.58 31.5 7.44 42.4 ?38.0      A. Fib Head EDB 14.56  2.3  7.44 12 2.71 44.3 ?38.0   L COMMON FIBULAR (PERON) - EDB      Ankle EDB 4.56 ?5.60 1.6 ?2.0 6.88 7.5         B. Fib Head EDB 12.31  1.5  8.00 30 7.75 38.7 ?38.0      A. Fib Head EDB 14.56  1.4  7.67 10 2.25 44.4 ?38.0   R Tibial - AH      Ankle AH 4.69 ?5.70 6.9 ?2.8 6.27 7.5         Knee AH 15.60  6.6  7.50 45 10.92 41.2 ?41.0   L Tibial - AH      Ankle AH 5.58 ?5.70 8.0 ?2.8 7.08 7.5         Knee AH 15.58  5.9  8.40 45 10.00 45.0 ?41.0       F  Wave      Nerve F min Ref.    ms ms   R COMMON FIBULAR (PERON) - EDB 54.3 ?56.0   R Tibial - AH 57.1 ?56.0   L COMMON FIBULAR (PERON) - EDB 60.7 ?56.0   L Tibial - AH 55.4 ?56.0       Sensory NCS      Nerve / Sites Rec. Site Peak Lat Ref. PP Amp Ref. Distance Vel.     ms ms ??V ??V cm m/s   R Sural - (Antidromic)      Calf Ankle NR ?4.20 NR ?5.0 14 NR   L Sural - (Antidromic)      Calf Ankle NR ?4.20 NR ?5.0 14 NR   R SUP FIBULAR (PERON) - Ankle      Lat leg Ankle 3.35 ?3.40 5.9 ?5.0 10 38       EMG Summary Table     Spontaneous MUP Recruitment Comment   Muscle Nerve Roots Fib PSW Fasc Other Amp Dur. PPP Pattern Other   L. Tibialis anterior Deep peroneal (Fibular) L4-L5 None None None None 1+ 1+ 1+ Reduced None   L. Gastrocnemius (Medial head) Tibial S1-S2 None None None None 1+ 1+ 1+ Reduced None   L. Gastrocnemius (Lateral head) Tibial S1-S2 None None None None 1+ 1+ 1+ Reduced None   L. Vastus medialis Femoral L2-L4 None None None None N N N Normal None   L. Tensor fasciae latae Superior gluteal L4-S1 None None None None 1+ 1+ 1+ Reduced None   R. Tibialis anterior  Deep peroneal (Fibular) L4-L5 None None None None N N N Normal None   R. Gastrocnemius (Medial head) Tibial S1-S2 None None None None N N N Normal None       Summary:  After identifying the patient in the waiting room and reviewing all appropriately available medical records, the patient was taken back to the examination room where the procedure was explained, the sites of examination were noted, the patient's questions were answered, and the patient's verbal consent for the procedure was obtained. Studies were performed at [facility] using a radiant warmer and [employer] Synergy EMG system.    Bilateral sural sensory responses are absent.    Motor conduction study shows mild reduction of CMAP amplitude in the left common fibular nerve.    Needle EMG shows chronic neurogenic changes in left L5-S1 innervated muscles.    Conclusions:  This study shows the presence of mild length dependent predominantly sensory axonal  neuropathy.  Additional left-sided lumbosacral radiculopathy (L5-S1) of chronic nature is also noted.  Left lumbosacral plexopathy cannot be excluded.        - - - - - - - - - - - - -   Resident or Fellow    As the attending physician, my signature affirms that I personally participated in the clinical assessment of this patient, performed or reviewed all electrodiagnostic procedures, and prepared or reviewed the conclusions of this report.      - - - - - - - - - - - -  Attending Electromyographer    Gasper Sells, MD   Carmon Ginsberg. Distinguished Professor  Stage manager, Division of Neuromuscular Medicine

## 2022-01-12 LAB — VITAMIN B6: VITAMIN B6: 10 ug/L

## 2022-01-13 ENCOUNTER — Ambulatory Visit: Admit: 2022-01-13 | Discharge: 2022-01-14 | Payer: PRIVATE HEALTH INSURANCE

## 2022-01-13 DIAGNOSIS — M810 Age-related osteoporosis without current pathological fracture: Secondary | ICD-10-CM | POA: Diagnosis not present

## 2022-01-13 NOTE — Unmapped (Signed)
Assessment/Plan:     The patient has the following risk factors for osteoporosis: chronic glucocorticoid use. Differential diagnosis includes primary vs secondary causes of osteoporosis. Prior lab evaluation showed normal PTH, TSH, creatinine, Lfts, vitamin D levels.     We discussed the importance of adequate calcium intake (~1200 mg daily) and he currently meets this requirement. Discussed weight bearing exercise for bone health. We will recheck his vit D level today.     Medical management was discussed and includes continuing alendronate vs switching to reclast. He has been on alendronate now for 1.5 years. I have no DEXA data to review. Will order follow up DEXA study. Also discussed given his history of esophagitis, dysphagia, that I prefer avoiding oral bisphosphonate long term. Would prefer reclast IV infusion once  yearly. I will send this note to his care team to confirm okay. The risks and benefits were explained. Risks of bisphosphonates include hypocalcemia, bone pain, flu-like symptoms with infusions, osteonecrosis of the jaw, and atypical femur fracture. Will wait until lab and DEXA results return before considering switching to reclast.     All questions were answered and patient agrees with plan.     Return in about 3 months (around 04/15/2022).      Thank you for referring your patient to our endocrine clinic for evaluation. Please do not hesitate to contact me with any questions.     Thompson Grayer, MD  [facility] at [facility]  Phone: [phone]   Fax: [phone]    This note was completed with the assistance of voice recognition software.  Please excuse grammatical errors.      Referring Provider: Truett Mainland     Primary Provider: Irving Copas, MD    Reason For Consultation: osteoporosis      Subjective:     History of Present Illness:  Jordan [name] is a 66 year old male with history of HTN, bronchiectasis, pulmonary aspergillosis, GERD, memory loss, dizziness, hemoptysis seen at the request of Dr. Garner Nash in consultation for evaluation of osteoporosis. History was obtained from the patient and review of the electronic medical record.     Diagnosis: 07/2020. Was on steroids for many years for his lung conditions. Now off oral steroids. He is on symbicort. Has lost 3-4 inches in height. Was previously followed by endocrinology locally in [facility] and wants to have his doctors under one roof. Has been on alendronate 70 mg weekly since last year. Tolerating this well. He plans to have a swallow study given trouble with swallowing pills. Has had 3 neck operations. Has history of aspiration pneumonia in the past.     DEXA history: not available to review    Fragility fractures: no  Other fractures history: no    Family history of osteoporosis or hip fracture: no    History of:  Thyroid disease: no  Elevated calcium levels: no  Multiple Myeloma: no  Cancer: no  Heartburn or reflux: yes but well controlled on omeprazole 20 mg daily  Kidney stones: yes, many years ago.   CVD: no. Has dilated aorta and on cardizem.   Radiation treatment: no    Dental work coming up? No, sees dentist every 4 months.     Exercise: on oxygen.     Diet: yogurt daily.   ETOH: no  Tobacco: no    Supplements:   Calcium: citracal 650 mg twice a day.   Vitamin D: no  MVI: one daily  The patient is taking the following:  Proton pump inhibitor: YES  Aromatase inhibitor: no  Corticosteroid: none now except symbicort  Heparin: no       Past Medical History:   Diagnosis Date    Abscess of lung (CMS-HCC) 11/06/2010    CT Chest 10/22/10 12/05/2010 right thoracotomy resection of right middle lobe and resection of right lower lobe abscess     Arthritis     back    Biceps tendon tear 2013    left side    Bronchiectasis (CMS-HCC)     chronic psuedomonas infection    Degenerative joint disease of left acromioclavicular joint     GERD (gastroesophageal reflux disease)     Hypertension     Obstructive sleep apnea on CPAP 03/14/13    AHI 33.5, on BiPAP 12/8 based on sleep study 09/2013    Pneumonia 2012    Lung abcess     PONV (postoperative nausea and vomiting)     Prostate cancer (CMS-HCC) 09/09/2017    Rotator cuff injury left    Vertigo         Past Surgical History:   Procedure Laterality Date    APPENDECTOMY  1969    BACK SURGERY      BRONCHOSCOPY  10/19/2010    [facility]    CARPAL TUNNEL RELEASE  1999    IR EMBOLIZATION HEMORRHAGE ART OR VEN  LYMPHATIC EXTRAVASATION  07/11/2021    IR EMBOLIZATION HEMORRHAGE ART OR VEN  LYMPHATIC EXTRAVASATION 07/11/2021 Braulio Conte, MD IMG VIR H&[facility]    LUNG REMOVAL, PARTIAL Right 10/2010    RML and RLL partially resected    MENISCECTOMY  2011    NECK SURGERY  1993, 1999, 2002    PR GERD TST W/ MUCOS IMPEDE ELECTROD,>1HR N/A 07/06/2012    Procedure: ESOPHAGEAL FUNCTION TEST, GASTROESOPHAGEAL REFLUX TEST W/ NASAL CATHETER INTRALUMINAL IMPEDANCE ELECTRODE(S) PLACEMENT, RECORDING, ANALYSIS AND INTERPRETATION; PROLONGED;  Surgeon: None None;  [facility];  Service: Gastroenterology    PR OPEN TREAT RIB FRACTURE W/INT FIXATION, UNILATERAL, 1-2 RIBS Right 03/19/2015    Procedure: OPEN TREATMENT OF RIB FRACTURE REQUIRING INTERNAL FIXATION, UNILATERAL; 1-2 RIBS;  Surgeon: Evert Kohl, MD;  Location: MAIN OR [facility];  Service: Cardiothoracic    PR THORACOTOMY W/THERAP WEDGE RESEXN ADDL IPSILATRL Right 08/07/2013    Procedure: THORACOTOMY; WITH THERAPEUTIC LOBECTOMY OF RIGHT MIDDLE AND RIGHT LOWER LOBE RESECTION , EACH ADDITIONAL RESECTION, IPSILATERAL;  Surgeon: Alvester Chou, MD;  Location: MAIN OR [place];  Service: Cardiothoracic    TONSILLECTOMY  1965         Current Outpatient Medications:     dilTIAZem (CARDIZEM CD) 120 MG 24 hr capsule, Take 1 capsule (120 mg total) by mouth daily., Disp: , Rfl:     L. acidophilus/Bifid. animalis 32 billion cell cap, Take 2 capsules by mouth daily., Disp: , Rfl:     albuterol 2.5 mg /3 mL (0.083 %) nebulizer solution, INHALE 3 ML (2.5 MG TOTAL) BY NEBULIZATION TWO (2) TIMES A DAY., Disp: 525 mL, Rfl: 3    albuterol HFA 90 mcg/actuation inhaler, Inhale 2 puffs Four (4) times a day. (Patient taking differently: Inhale 2 puffs four (4) times a day as needed.), Disp: 25.5 g, Rfl: 3    alcohol swabs (ALCOHOL PREP PADS) PadM, Use as directed with inhaled antibiotics, Disp: 100 each, Rfl: 5    alendronate (FOSAMAX) 70 MG tablet, TAKE 1 TABLET BY MOUTH EVERY 7 DAYS FOR 12  DOSES, Disp: , Rfl:     ascorbic acid, vitamin C, (VITAMIN C) 1000 MG tablet, Take 1 tablet (1,000 mg total) by mouth daily., Disp: , Rfl:     atorvastatin (LIPITOR) 10 MG tablet, Take 1 tablet (10 mg total) by mouth daily., Disp: , Rfl:     azithromycin (ZITHROMAX) 250 MG tablet, TAKE 1 TABLET BY MOUTH EVERY DAY, Disp: 90 tablet, Rfl: 3    budesonide-formoteroL (SYMBICORT) 80-4.5 mcg/actuation inhaler, Inhale 2 puffs Two (2) times a day., Disp: 10.2 g, Rfl: 11    calcium carbonate (CALCIUM 600 ORAL), Take 2 tablets by mouth two (2) times a day. 1 in morning and 1 in the evening, Disp: , Rfl:     coenzyme Q10 300 mg cap, Take 10-300 mg by mouth daily., Disp: , Rfl:     colistimethate (COLYMYCIN) 150 mg injection, Inject 2 mL sterile water for injection to mix colistin vial, then draw up 2 mL (150mg ) and inhale 2 times a day, 28 days on and 28 days off., Disp: 60 each, Rfl: 5    dornase alfa (PULMOZYME) 1 mg/mL nebulizer solution, Inhale 1 ampule (2.5 mg) daily. Use at least 30-60 minutes before airway clearance, or after airway clearance, Disp: 225 mL, Rfl: 3    empty container (SHARPS CONTAINER) Misc, USE AS DIRECTED, Disp: 1 each, Rfl: 0    empty container Misc, USE AS DIRECTED, Disp: 1 each, Rfl: 2    ferrous sulfate 325 (65 FE) MG tablet, Take 1 tablet (325 mg total) by mouth Two (2) times a day. BID, Disp: , Rfl:     fluticasone propionate (FLONASE) 50 mcg/actuation nasal spray, 1 spray into each nostril daily., Disp: , Rfl: furosemide (LASIX) 20 MG tablet, TAKE 1 TABLET(20 MG) BY MOUTH DAILY, Disp: 30 tablet, Rfl: 2    HYDROcodone-acetaminophen (NORCO) 7.5-325 mg per tablet, Take 1 tablet by mouth three (3) times a day (at 6am, noon and 6pm)., Disp: , Rfl:     mirtazapine (REMERON) 15 MG tablet, Take 1 tablet (15 mg total) by mouth nightly., Disp: 90 tablet, Rfl: 3    montelukast (SINGULAIR) 10 mg tablet, TAKE 1 TABLET BY MOUTH EVERY DAY AT NIGHT, Disp: 90 tablet, Rfl: 3    multivitamin-minerals-lutein Tab, Take 1 tablet by mouth daily.  (Patient not taking: Reported on 12/22/2021), Disp: , Rfl:     nebulizers (LC PLUS) Misc, Use with inhaled medications, Disp: 1 each, Rfl: 5    needle, disp, 21 G (BD REGULAR BEVEL NEEDLES) 21 gauge x 1 1/2 Ndle, Use as directed with inhaled Colistin, Disp: 100 each, Rfl: 3    omeprazole (PRILOSEC) 20 MG capsule, Take 1 capsule (20 mg total) by mouth Two (2) times a day., Disp: , Rfl:     OXYGEN-AIR DELIVERY SYSTEMS MISC, Inhale 2 mL., Disp: , Rfl:     sodium chloride (BD POSIFLUSH NORMAL SALINE 0.9) 0.9 % injection, Inject 2mL of 0.9%NaCl into colistin vial & gently mix. After withdrawing colistin dose, add an additional 1mL of 0.9%NaCl to neb cup with the colistin dose., Disp: 180 mL, Rfl: 11    sodium chloride 10 % Nebu, Measure and Inhale 5 mL by nebulization Two (2) times a day. Discard remaining amount and use a new vial for each dose., Disp: 900 mL, Rfl: 3    sterile water Soln, Use 2mL to mix Colistin, then add additional 2mL to neb cup with 2mL of mixed colistin. Inhale twice daily 28 days on and 28 days  off., Disp: 600 mL, Rfl: 5    syringe with needle (BD LUER-LOK SYRINGE) 3 mL 20 gauge x 1 1/2 Syrg, For use with inhaled antibiotic (Colistin), Disp: 60 each, Rfl: 30    syringe with needle (BD LUER-LOK SYRINGE) 3 mL 21 gauge x 1 Syrg, For use with inhaled antibiotic (Colistin), Disp: 60 each, Rfl: 5    tiotropium bromide (SPIRIVA RESPIMAT) 2.5 mcg/actuation inhalation mist, Inhale 2 puffs daily., Disp: 12 g, Rfl: 3    tobramycin, PF, ([name]) 300 mg/5 mL nebulizer solution, Inhale the contents of 1 ampule (300 mg total) by nebulization every twelve (12) hours. 28 days on and 28 days off., Disp: 280 mL, Rfl: 5    zinc gluconate 50 mg (7 mg elemental zinc) tablet, Take 1 tablet (50 mg total) by mouth., Disp: , Rfl:      Allergies   Allergen Reactions    Gentamicin Other (See Comments)     BALANCE ISSUES    Tobramycin Other (See Comments)     ototoxicity    Morphine Nausea And Vomiting       Family History   Problem Relation Age of Onset    Alzheimer's disease Mother     Stroke Mother     Bronchiectasis  Brother         presumed    Bronchiectasis  Brother         presumed    Liver disease Father     Diabetes Father     Heart failure Father     Bronchiectasis  Daughter     Asthma Son         childhood asthma    Clotting disorder Neg Hx     Anesthesia problems Neg Hx        Social History     Social History Narrative    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       Review of Systems reviewed and otherwise negative except as noted in HPI.    Objective:     Physical Exam:  BP 116/70  - Pulse 77  - Temp 36.5 ??C (97.7 ??F) (Temporal)  - Wt 84.2 kg (185 lb 9.6 oz)  - BMI 27.81 kg/m??   General appearance - alert, well appearing, and in no distress, wears nasal cannula.   Eyes - No lid lag or stare, no proptosis, EOM's intact.  Mouth - mucous membranes moist  Neck - supple, thyroid normal in size without nodules or tenderness  Lymphatics - no cervical or supraclavicular adenopathy appreciated   Neurological - no hand tremors  Extremities - no lower extremity edema  Skin - warm, dry, no visible rashes or abnormal pigmentation noted  Psych - Normal mood, appropriate affect  Muskuloskeletal - No kyphosis or spine tenderness    Lab Review:    Lab Results   Component Value Date    CALCIUM 8.9 12/22/2021    CALCIUM 9.1 11/21/2021    CALCIUM 9.2 11/14/2021    PHOS 3.7 01/22/2016    PHOS 4.1 06/23/2013    PHOS 4.0 06/21/2013    CREATININE 0.93 12/22/2021    CREATININE 1.10 11/21/2021    CREATININE 1.17 (H) 11/14/2021    ALBUMIN 3.7 10/14/2021    ALBUMIN 4.0 10/14/2021    ALBUMIN 4.2 11/17/2020       TSH (uIU/mL)   Date Value   10/14/2021 1.334     Free T4 (ng/dL)  Date Value   10/14/2021 1.19         Radiology:   No results found for this or any previous visit.

## 2022-01-13 NOTE — Unmapped (Signed)
It was a pleasure seeing you today at our [facility] Endocrinology clinic.   If any testing was ordered today, the results will most likely appear in your mychart account even before I see them. Once all your results are available, I will contact you within 1-2 weeks with an explanation. Please contact me if you do not hear from me by then.   Please make sure you have access to mychart since this is how I will communicate with you.  If you do not have mychart, I will send you a letter which may take 1-2 weeks to arrive.      Remember to call us or schedule an appointment with any urgent issues. An appointment is preferred if a discussion is needed.   Please allow a few days for mychart messages to be read and answered for non-urgent matters.     If you are seen for diabetes, please bring your glucometer and all devices to every visit.   All refill requests must be submitted 14 days in advance to allow enough time to be processed.     Labs todays.   Schedule bone density study.   We plan to consider switching from fosamax (alendronate) oral medication to IV Reclast infusion once a year to avoid irritating your GI tract.

## 2022-01-14 NOTE — Unmapped (Signed)
Addended by: Mallie Darting on: 01/14/2022 01:56 PM     Modules accepted: Level of Service

## 2022-01-15 DIAGNOSIS — J449 Chronic obstructive pulmonary disease, unspecified: Secondary | ICD-10-CM | POA: Diagnosis not present

## 2022-01-15 DIAGNOSIS — G4733 Obstructive sleep apnea (adult) (pediatric): Secondary | ICD-10-CM | POA: Diagnosis not present

## 2022-01-15 DIAGNOSIS — J479 Bronchiectasis, uncomplicated: Secondary | ICD-10-CM | POA: Diagnosis not present

## 2022-01-15 MED ORDER — NEBULIZERS
Freq: Every day | 0 refills | 0 days
Start: 2022-01-15 — End: ?

## 2022-01-15 NOTE — Unmapped (Signed)
[facility] Pharm[facility] Refill Coordination Note    Specialty Medication(s) to be Shipped:   CF/Pulmonary/Asthma: colistimethate    Other medication(s) to be shipped:  BD Regular Bevel needles 21G, BD Luer-Lok syringe 21G, BD Posiflush normal saline 0.9%, LC Plus, alcohol prep pads**alternates tobramycin and colistimethate**     Jordan [name], DOB: [birth year]  Phone: [phone] (home)       All above HIPAA information was verified with patient.     Was a Nurse, learning disability used for this call? No    Completed refill call assessment today to schedule patient's medication shipment from the [facility] [phone]).  All relevant notes have been reviewed.     Specialty medication(s) and dose(s) confirmed: Regimen is correct and unchanged.   Changes to medications: Jordan [name] reports no changes at this time.  Changes to insurance: No  New side effects reported not previously addressed with a pharmacist or physician: None reported  Questions for the pharmacist: No    Confirmed patient received a Conservation officer, historic buildings and a Surveyor, mining with first shipment. The patient will receive a drug information handout for each medication shipped and additional FDA Medication Guides as required.       DISEASE/MEDICATION-SPECIFIC INFORMATION        N/A    SPECIALTY MEDICATION ADHERENCE     Medication Adherence    Patient reported X missed doses in the last month: 0  Specialty Medication: colistimethate 150 mg injection (COLYMYCIN)  Patient is on additional specialty medications: Yes  Additional Specialty Medications: dornase alfa 1 mg/mL nebulizer solution (PULMOZYME)  Patient Reported Additional Medication X Missed Doses in the Last Month: 0  Patient is on more than two specialty medications: Yes  Specialty Medication: tobramycin (PF) 300 mg/5 mL nebulizer solution ([name])  Patient Reported Additional Medication X Missed Doses in the Last Month: 0  Any gaps in refill history greater than 2 weeks in the last 3 months: no  Demonstrates understanding of importance of adherence: yes  Informant: patient  Reliability of informant: reliable  Provider-estimated medication adherence level: good  Patient is at risk for Non-Adherence: No  Reasons for non-adherence: no problems identified            Support network for adherence: family member                    Were doses missed due to medication being on hold? No    colistimethate 150 mg injection (COLYMYCIN)  : 0 days of medicine on hand   tobramycin (PF) 300 mg/5 mL nebulizer solution ([name])  : 0 days of medicine on hand       REFERRAL TO PHARMACIST     Referral to the pharmacist: Not needed      [facility]     Shipping address confirmed in Epic.     Delivery Scheduled: Yes, Expected medication delivery date: 01/20/22.     Medication will be delivered via UPS to the prescription address in Epic WAM.    Jordan [name] [facility] Specialty Technician

## 2022-01-16 DIAGNOSIS — M818 Other osteoporosis without current pathological fracture: Principal | ICD-10-CM

## 2022-01-19 DIAGNOSIS — J479 Bronchiectasis, uncomplicated: Principal | ICD-10-CM

## 2022-01-19 DIAGNOSIS — C61 Malignant neoplasm of prostate: Secondary | ICD-10-CM | POA: Diagnosis not present

## 2022-01-19 DIAGNOSIS — J3089 Other allergic rhinitis: Principal | ICD-10-CM

## 2022-01-19 MED ORDER — MONTELUKAST 10 MG TABLET
ORAL_TABLET | 3 refills | 0 days
Start: 2022-01-19 — End: ?

## 2022-01-19 MED ORDER — AZITHROMYCIN 250 MG TABLET
ORAL_TABLET | 3 refills | 0 days
Start: 2022-01-19 — End: ?

## 2022-01-19 MED FILL — LC PLUS MISC: 30 days supply | Qty: 1 | Fill #0

## 2022-01-19 MED FILL — ALCOHOL PREP PADS: 50 days supply | Qty: 100 | Fill #1

## 2022-01-19 MED FILL — BD REGULAR BEVEL NEEDLES 21 GAUGE X 1 1/2": 30 days supply | Qty: 60 | Fill #5

## 2022-01-19 MED FILL — COLISTIN (COLISTIMETHATE SODIUM) 150 MG SOLUTION FOR INJECTION: 30 days supply | Qty: 60 | Fill #3

## 2022-01-19 MED FILL — BD POSIFLUSH NORMAL SALINE 0.9 % INJECTION SYRINGE: 30 days supply | Qty: 180 | Fill #5

## 2022-01-19 MED FILL — BD LUER-LOK SYRINGE 3 ML 21 GAUGE X 1": 30 days supply | Qty: 60 | Fill #2

## 2022-01-20 LAB — VITAMIN D 25 HYDROXY: VITAMIN D, TOTAL (25OH): 50 ng/mL (ref 20.0–80.0)

## 2022-01-20 MED ORDER — MONTELUKAST 10 MG TABLET
ORAL_TABLET | 3 refills | 0 days | Status: CP
Start: 2022-01-20 — End: ?

## 2022-01-20 MED ORDER — AZITHROMYCIN 250 MG TABLET
ORAL_TABLET | 3 refills | 0 days | Status: CP
Start: 2022-01-20 — End: ?

## 2022-01-22 DIAGNOSIS — G4733 Obstructive sleep apnea (adult) (pediatric): Secondary | ICD-10-CM | POA: Diagnosis not present

## 2022-01-22 DIAGNOSIS — J479 Bronchiectasis, uncomplicated: Secondary | ICD-10-CM | POA: Diagnosis not present

## 2022-01-22 DIAGNOSIS — J471 Bronchiectasis with (acute) exacerbation: Secondary | ICD-10-CM | POA: Diagnosis not present

## 2022-01-23 DIAGNOSIS — G629 Polyneuropathy, unspecified: Secondary | ICD-10-CM | POA: Diagnosis not present

## 2022-01-23 DIAGNOSIS — C61 Malignant neoplasm of prostate: Secondary | ICD-10-CM | POA: Diagnosis not present

## 2022-01-23 DIAGNOSIS — J479 Bronchiectasis, uncomplicated: Secondary | ICD-10-CM | POA: Diagnosis not present

## 2022-01-23 DIAGNOSIS — M810 Age-related osteoporosis without current pathological fracture: Secondary | ICD-10-CM | POA: Diagnosis not present

## 2022-01-26 DIAGNOSIS — C61 Malignant neoplasm of prostate: Secondary | ICD-10-CM | POA: Diagnosis not present

## 2022-01-26 DIAGNOSIS — N5201 Erectile dysfunction due to arterial insufficiency: Secondary | ICD-10-CM | POA: Diagnosis not present

## 2022-01-26 DIAGNOSIS — N393 Stress incontinence (female) (male): Secondary | ICD-10-CM | POA: Diagnosis not present

## 2022-01-28 ENCOUNTER — Ambulatory Visit: Payer: PPO | Admitting: Cardiology

## 2022-02-03 ENCOUNTER — Ambulatory Visit: Admit: 2022-02-03 | Discharge: 2022-02-04 | Payer: PRIVATE HEALTH INSURANCE

## 2022-02-03 DIAGNOSIS — Z7952 Long term (current) use of systemic steroids: Secondary | ICD-10-CM | POA: Diagnosis not present

## 2022-02-03 DIAGNOSIS — Z13820 Encounter for screening for osteoporosis: Secondary | ICD-10-CM | POA: Diagnosis not present

## 2022-02-03 DIAGNOSIS — M810 Age-related osteoporosis without current pathological fracture: Secondary | ICD-10-CM | POA: Diagnosis not present

## 2022-02-09 NOTE — Unmapped (Signed)
[facility] Pharm[facility] Refill Coordination Note    Specialty Medication(s) to be Shipped:   CF/Pulmonary/Asthma: -[name] Brand (tobramycin 300mg /2mL) inhalation solution    Other medication(s) to be shipped: No additional medications requested for fill at this time     Jordan [name], DOB: Mar 31, 1955  Phone: [phone] (home)       All above HIPAA information was verified with patient.     Was a Nurse, learning disability used for this call? No    Completed refill call assessment today to schedule patient's medication shipment from the [facility] [phone]).  All relevant notes have been reviewed.     Specialty medication(s) and dose(s) confirmed: Regimen is correct and unchanged.   Changes to medications: [name] reports no changes at this time.  Changes to insurance: No  New side effects reported not previously addressed with a pharmacist or physician: None reported  Questions for the pharmacist: No    Confirmed patient received a Conservation officer, historic buildings and a Surveyor, mining with first shipment. The patient will receive a drug information handout for each medication shipped and additional FDA Medication Guides as required.       DISEASE/MEDICATION-SPECIFIC INFORMATION        For CF patients: CF [employer] Active? No-not enrolled    SPECIALTY MEDICATION ADHERENCE     Medication Adherence    Patient reported X missed doses in the last month: 0  Specialty Medication: tobramycin (PF) 300 mg/5 mL nebulizer solution ([name])  Patient is on additional specialty medications: No            Support network for adherence: family member                    Were doses missed due to medication being on hold? No    tobramycin 300/5 mg/ml: 0 days of medicine on hand           REFERRAL TO PHARMACIST     Referral to the pharmacist: Not needed      [facility]     Shipping address confirmed in Epic.     Delivery Scheduled: Yes, Expected medication delivery date: 02/18/22.     Medication will be delivered via UPS to the prescription address in Epic WAM.    Unk Lightning   [facility] Specialty Technician

## 2022-02-15 DIAGNOSIS — G4733 Obstructive sleep apnea (adult) (pediatric): Secondary | ICD-10-CM | POA: Diagnosis not present

## 2022-02-15 DIAGNOSIS — J449 Chronic obstructive pulmonary disease, unspecified: Secondary | ICD-10-CM | POA: Diagnosis not present

## 2022-02-15 DIAGNOSIS — J479 Bronchiectasis, uncomplicated: Secondary | ICD-10-CM | POA: Diagnosis not present

## 2022-02-17 MED FILL — TOBRAMYCIN 300 MG/5 ML IN 0.225 % SODIUM CHLORIDE FOR NEBULIZATION: RESPIRATORY_TRACT | 28 days supply | Qty: 280 | Fill #1

## 2022-02-21 DIAGNOSIS — J471 Bronchiectasis with (acute) exacerbation: Secondary | ICD-10-CM | POA: Diagnosis not present

## 2022-02-21 DIAGNOSIS — J479 Bronchiectasis, uncomplicated: Secondary | ICD-10-CM | POA: Diagnosis not present

## 2022-02-21 DIAGNOSIS — G4733 Obstructive sleep apnea (adult) (pediatric): Secondary | ICD-10-CM | POA: Diagnosis not present

## 2022-02-26 ENCOUNTER — Ambulatory Visit: Admit: 2022-02-26 | Discharge: 2022-02-27 | Payer: PRIVATE HEALTH INSURANCE

## 2022-02-26 DIAGNOSIS — M818 Other osteoporosis without current pathological fracture: Secondary | ICD-10-CM | POA: Diagnosis not present

## 2022-02-26 DIAGNOSIS — Z79899 Other long term (current) drug therapy: Secondary | ICD-10-CM | POA: Diagnosis not present

## 2022-02-26 MED ADMIN — acetaminophen (TYLENOL) tablet 650 mg: 650 mg | ORAL | @ 21:00:00 | Stop: 2022-02-26

## 2022-02-26 MED ADMIN — zoledronic acid-mannitol&water (RECLAST) 5 mg/100 mL infusion 5 mg: 5 mg | INTRAVENOUS | @ 22:00:00 | Stop: 2022-02-26

## 2022-02-27 NOTE — Unmapped (Signed)
Pt presents for Reclast infusion.  VSS, premeds administered.  Pt aware of potential reaction/side effects, call bell within reach.    Calcium = 8.9 (12/22/2021)  Creatinine = 0.93 (12/22/2021)  Creatinine Clearance = 88.39    1631 Reclast started  1651 Reclast complete.  Pt tolerated without complication, VSS.  IV flushed per policy.     1721 Pt tolerated post infusion observation without any ill effects. IV d/c'd, gauze and coban applied.  Pt left clinic in no acute distress.

## 2022-03-03 DIAGNOSIS — J479 Bronchiectasis, uncomplicated: Principal | ICD-10-CM

## 2022-03-05 DIAGNOSIS — R600 Localized edema: Principal | ICD-10-CM

## 2022-03-05 MED ORDER — FUROSEMIDE 20 MG TABLET
ORAL_TABLET | Freq: Every day | ORAL | 2 refills | 30 days
Start: 2022-03-05 — End: ?

## 2022-03-09 DIAGNOSIS — J479 Bronchiectasis, uncomplicated: Principal | ICD-10-CM

## 2022-03-09 MED ORDER — EMPTY CONTAINER
2 refills | 0 days
Start: 2022-03-09 — End: ?

## 2022-03-09 MED ORDER — BD REGULAR BEVEL NEEDLES 21 GAUGE X 1 1/2"
3 refills | 0 days
Start: 2022-03-09 — End: ?

## 2022-03-09 MED ORDER — SPIRIVA RESPIMAT 2.5 MCG/ACTUATION SOLUTION FOR INHALATION
Freq: Every day | RESPIRATORY_TRACT | 3 refills | 90 days
Start: 2022-03-09 — End: 2023-03-09

## 2022-03-09 NOTE — Unmapped (Addendum)
[facility] Pharm[facility] Refill Coordination Note    Specialty Medication(s) to be Shipped:   CF/Pulmonary/Asthma: colistimethate    Other medication(s) to be shipped: Transport planner, BD Regular Bevel needles 21G, BD Luer-Lok syringe 21G, BD Posiflush normal saline 0.9%, LC Plus, Spiriva, Sodium Chloride 10% neb solution**alternates tobramycin and colistimethateDEIONDRE [name], DOB: 10-Aug-1955  Phone: [phone] (home)       All above HIPAA information was verified with patient.     Was a Nurse, learning disability used for this call? No    Completed refill call assessment today to schedule patient's medication shipment from the [facility] [phone]).  All relevant notes have been reviewed.     Specialty medication(s) and dose(s) confirmed: Regimen is correct and unchanged.   Changes to medications: Jordan [name] reports no changes at this time.  Changes to insurance: No  New side effects reported not previously addressed with a pharmacist or physician: None reported  Questions for the pharmacist: No    Confirmed patient received a Conservation officer, historic buildings and a Surveyor, mining with first shipment. The patient will receive a drug information handout for each medication shipped and additional FDA Medication Guides as required.       DISEASE/MEDICATION-SPECIFIC INFORMATION        N/A    SPECIALTY MEDICATION ADHERENCE     Medication Adherence    Patient reported X missed doses in the last month: 0  Specialty Medication: Colistin  Patient is on additional specialty medications: No  Any gaps in refill history greater than 2 weeks in the last 3 months: no  Demonstrates understanding of importance of adherence: yes  Informant: patient  Reliability of informant: reliable          Support network for adherence: family member      Confirmed plan for next specialty medication refill: delivery by pharmacy  Refills needed for supportive medications: yes, ordered or provider notified              Were doses missed due to medication being on hold? No    colistimethate 150 mg injection (COLYMYCIN)  : 0 days of medicine on hand         REFERRAL TO PHARMACIST     Referral to the pharmacist: Not needed      [facility]     Shipping address confirmed in Epic.     Delivery Scheduled: Yes, Expected medication delivery date: 12/21.     Medication will be delivered via UPS to the prescription address in Epic WAM.    Valere Dross   [facility] Specialty Technician

## 2022-03-10 MED ORDER — BD REGULAR BEVEL NEEDLES 21 GAUGE X 1 1/2"
3 refills | 0 days | Status: CP
Start: 2022-03-10 — End: ?
  Filled 2022-03-11: qty 60, 30d supply, fill #0

## 2022-03-10 MED ORDER — SPIRIVA RESPIMAT 2.5 MCG/ACTUATION SOLUTION FOR INHALATION
Freq: Every day | RESPIRATORY_TRACT | 3 refills | 90 days | Status: CP
Start: 2022-03-10 — End: 2023-03-10
  Filled 2022-03-11: qty 12, 90d supply, fill #0

## 2022-03-11 MED FILL — COLISTIN (COLISTIMETHATE SODIUM) 150 MG SOLUTION FOR INJECTION: 30 days supply | Qty: 60 | Fill #4

## 2022-03-11 MED FILL — EMPTY CONTAINER: 120 days supply | Qty: 1 | Fill #0

## 2022-03-11 MED FILL — LC PLUS MISC: 30 days supply | Qty: 1 | Fill #1

## 2022-03-11 MED FILL — BD POSIFLUSH NORMAL SALINE 0.9 % INJECTION SYRINGE: 30 days supply | Qty: 180 | Fill #6

## 2022-03-11 MED FILL — SODIUM CHLORIDE 10 % FOR NEBULIZATION: RESPIRATORY_TRACT | 30 days supply | Qty: 900 | Fill #3

## 2022-03-11 MED FILL — BD LUER-LOK SYRINGE 3 ML 21 GAUGE X 1": 30 days supply | Qty: 60 | Fill #3

## 2022-03-13 ENCOUNTER — Ambulatory Visit
Admit: 2022-03-13 | Discharge: 2022-03-14 | Payer: PRIVATE HEALTH INSURANCE | Attending: Speech-Language Pathologist | Primary: Speech-Language Pathologist

## 2022-03-13 DIAGNOSIS — K219 Gastro-esophageal reflux disease without esophagitis: Secondary | ICD-10-CM | POA: Diagnosis not present

## 2022-03-13 DIAGNOSIS — Z885 Allergy status to narcotic agent status: Secondary | ICD-10-CM | POA: Diagnosis not present

## 2022-03-13 DIAGNOSIS — G4733 Obstructive sleep apnea (adult) (pediatric): Secondary | ICD-10-CM | POA: Diagnosis not present

## 2022-03-13 DIAGNOSIS — Z9989 Dependence on other enabling machines and devices: Secondary | ICD-10-CM | POA: Diagnosis not present

## 2022-03-13 DIAGNOSIS — I10 Essential (primary) hypertension: Secondary | ICD-10-CM | POA: Diagnosis not present

## 2022-03-13 DIAGNOSIS — Z79899 Other long term (current) drug therapy: Secondary | ICD-10-CM | POA: Diagnosis not present

## 2022-03-13 DIAGNOSIS — Z888 Allergy status to other drugs, medicaments and biological substances status: Secondary | ICD-10-CM | POA: Diagnosis not present

## 2022-03-13 DIAGNOSIS — R1310 Dysphagia, unspecified: Secondary | ICD-10-CM | POA: Diagnosis not present

## 2022-03-16 NOTE — Unmapped (Signed)
[facility] ADULT SPEECH THERAPY [place]  OUTPATIENT SPEECH PATHOLOGY  03/13/2022             Patient Name: Jordan [name]  Date of Birth:11-05-1955  Session Number: 1  Diagnosis: @DX @     Date of Evaluation: 03/13/22     Referred by: Dr. Romana Juniper  Reason for Referral: Re-evaluation, Swallow Evaluation    Assessment: SLP donned surgical mask, eye shields and gloves. Patient demonstrated normal oral and peripheral structures.Scars are noted at anterior neck constistent with report of neck surgery which apparently includes a remote history of ACDF.  Patient presents with normal oral and peripheral examination.  Lingual mobility is good.  Laryngeal motion, briskness, and elevation normal to palpation.  Voice is perceptually normal.  Patient has not lost significant weight and has not had any diagnosed aspiration pneumonia for extended period.  He does report some difficulty swallowing solid foods but is on a regular diet.  He is not losing significant weight at this time.  Additionally, patient presents with cough primarily after swallowing thin liquids.  He is supplemental O2 dependent, which places him in higher risk category regarding thin liquid aspiration.  No signs of aspiration were noted today with p.o. intake of pur??e, regular solid, thin liquid single and multiple discrete sips.  Additionally, patient does not report solid food stasis at this time.  Though, clinical evaluation limited to cursory boluses..  Given history of dysphagia, SLP discussed and obtained agreement with patient to start general oropharyngeal strategies and exercises.  This includes EMST establishment per manufacturer recommendation contained within device presented to patient.  Patient is to seek approval from pulmonologist for starting this program in earnest.  He was then placed on truncated Shaker exercises and Masako maneuver.  Patient demonstrated 100% accuracy with all repeated exercises.  He has learned chin down posture for aiding thin liquid swallows.  Diet consistency alterations are necessary.  Patient agrees to home program as set with additions for counseling with pulmonology or PCP.  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 He was seen by SLP for modified barium swallow study 01/17/2019 results of modified barium swallow show laryngeal penetration only but no aspiration.  PMH remains at high risk for microaspiration. He endorsed some coughing with intake. He is otherwise eating a regular consistency diet and thin liquids. No alternate means of nutrition in place.    Communication Preference: Verbal         Barriers to Learning: No Barriers   Hearing Exceptions: No hearing aid                            Services patient receives: SLP         Prior treatment for referral reason: No  Pain?: No      Precautions: Aspiration         Prior Function: Independent                     Past Medical History:   Diagnosis Date    Abscess of lung (CMS-HCC) 11/06/2010    CT Chest 10/22/10 12/05/2010 right thoracotomy resection of right middle lobe and resection of right lower lobe abscess     Arthritis     back    Biceps tendon tear 2013    left side    Bronchiectasis (CMS-HCC)     chronic psuedomonas infection    Degenerative joint disease of left acromioclavicular joint     GERD (gastroesophageal reflux disease) Hypertension     Obstructive sleep apnea on CPAP 03/14/13    AHI 33.5, on BiPAP 12/8 based on sleep study 09/2013    Pneumonia 2012    Lung abcess     PONV (postoperative nausea and vomiting)     Prostate cancer (CMS-HCC) 09/09/2017    Rotator cuff injury left    Vertigo       Family History   Problem Relation Age of Onset    Alzheimer's disease Mother     Stroke Mother     Bronchiectasis  Brother         presumed    Bronchiectasis  Brother         presumed    Liver disease Father     Diabetes Father     Heart failure Father     Bronchiectasis  Daughter     Asthma Son         childhood asthma    Clotting disorder Neg Hx     Anesthesia problems Neg Hx      Past Surgical History:   Procedure Laterality Date    APPENDECTOMY  1969    BACK SURGERY      BRONCHOSCOPY  10/19/2010    [facility]    CARPAL TUNNEL RELEASE  1999    IR EMBOLIZATION HEMORRHAGE ART OR VEN  LYMPHATIC EXTRAVASATION  07/11/2021    IR EMBOLIZATION HEMORRHAGE ART OR VEN  LYMPHATIC EXTRAVASATION 07/11/2021 Jordan Conte, MD IMG VIR [facility]    LUNG REMOVAL, PARTIAL Right 10/2010    RML and RLL partially resected    MENISCECTOMY  2011    NECK SURGERY  1993, 1999, 2002    PR GERD TST W/ MUCOS IMPEDE ELECTROD,>1HR N/A 07/06/2012    Procedure: ESOPHAGEAL FUNCTION TEST, GASTROESOPHAGEAL REFLUX TEST W/ NASAL CATHETER INTRALUMINAL IMPEDANCE ELECTRODE(S) PLACEMENT, RECORDING, ANALYSIS AND INTERPRETATION; PROLONGED;  Surgeon: None None;  [facility];  Service: Gastroenterology    PR OPEN TREAT RIB FRACTURE W/INT FIXATION, UNILATERAL, 1-2 RIBS Right 03/19/2015    Procedure: OPEN TREATMENT OF RIB FRACTURE REQUIRING INTERNAL FIXATION, UNILATERAL; 1-2 RIBS;  Surgeon: Evert Kohl, MD;  Location: MAIN OR [facility];  Service: Cardiothoracic    PR THORACOTOMY W/THERAP WEDGE RESEXN ADDL IPSILATRL Right 08/07/2013    Procedure: THORACOTOMY; WITH THERAPEUTIC LOBECTOMY OF RIGHT MIDDLE AND RIGHT LOWER LOBE RESECTION , EACH ADDITIONAL RESECTION, IPSILATERAL;  Surgeon: Alvester Chou, MD;  Location: MAIN OR [facility];  Service: Cardiothoracic    TONSILLECTOMY  1965      Allergies   Allergen Reactions    Gentamicin Other (See Comments)     BALANCE ISSUES    Tobramycin Other (See Comments)     ototoxicity    Morphine Nausea  And Vomiting     Social History     Tobacco Use    Smoking status: Never    Smokeless tobacco: Never   Substance Use Topics    Alcohol use: No     Alcohol/week: 0.0 standard drinks of alcohol      Current Outpatient Medications   Medication Sig Dispense Refill    albuterol 2.5 mg /3 mL (0.083 %) nebulizer solution INHALE 3 ML (2.5 MG TOTAL) BY NEBULIZATION TWO (2) TIMES A DAY. 525 mL 3    albuterol HFA 90 mcg/actuation inhaler Inhale 2 puffs Four (4) times a day. (Patient taking differently: Inhale 2 puffs four (4) times a day as needed.) 25.5 g 3    alcohol swabs (ALCOHOL PREP PADS) PadM Use as directed with inhaled antibiotics 100 each 5    alendronate (FOSAMAX) 70 MG tablet TAKE 1 TABLET BY MOUTH EVERY 7 DAYS FOR 12 DOSES      ascorbic acid, vitamin C, (VITAMIN C) 1000 MG tablet Take 1 tablet (1,000 mg total) by mouth daily.      atorvastatin (LIPITOR) 10 MG tablet Take 1 tablet (10 mg total) by mouth daily.      azithromycin (ZITHROMAX) 250 MG tablet TAKE 1 TABLET BY MOUTH EVERY DAY 90 tablet 3    budesonide-formoteroL (SYMBICORT) 80-4.5 mcg/actuation inhaler Inhale 2 puffs Two (2) times a day. 10.2 g 11    calcium carbonate (CALCIUM 600 ORAL) Take 2 tablets by mouth two (2) times a day. 1 in morning and 1 in the evening      coenzyme Q10 300 mg cap Take 10-300 mg by mouth daily.      colistimethate (COLYMYCIN) 150 mg injection Inject 2 mL sterile water for injection to mix colistin vial, then draw up 2 mL (150mg ) and inhale 2 times a day, 28 days on and 28 days off. 60 each 5    dilTIAZem (CARDIZEM CD) 120 MG 24 hr capsule Take 1 capsule (120 mg total) by mouth daily.      dornase alfa (PULMOZYME) 1 mg/mL nebulizer solution Inhale 1 ampule (2.5 mg) daily. Use at least 30-60 minutes before airway clearance, or after airway clearance 225 mL 3    empty container (SHARPS CONTAINER) Misc USE AS DIRECTED 1 each 0    empty container Misc USE AS DIRECTED 1 each 2    empty container Misc Use as directed to dispose of needles. When full, make sure lid is closed tightly then dispose of container in trash. 1 each 2    ferrous sulfate 325 (65 FE) MG tablet Take 1 tablet (325 mg total) by mouth Two (2) times a day. BID      fluticasone propionate (FLONASE) 50 mcg/actuation nasal spray 1 spray into each nostril daily.      furosemide (LASIX) 20 MG tablet TAKE 1 TABLET(20 MG) BY MOUTH DAILY 30 tablet 2    HYDROcodone-acetaminophen (NORCO) 7.5-325 mg per tablet Take 1 tablet by mouth three (3) times a day (at 6am, noon and 6pm).      L. acidophilus/Bifid. animalis 32 billion cell cap Take 2 capsules by mouth daily.      montelukast (SINGULAIR) 10 mg tablet TAKE 1 TABLET BY MOUTH EVERY DAY AT NIGHT 90 tablet 3    multivitamin-minerals-lutein Tab Take 1 tablet by mouth daily.  (Patient not taking: Reported on 12/22/2021)      nebulizers (LC PLUS) Misc Use with inhaled medications 1 each 5    nebulizers Misc use with  nebulized medications 12 each 0    needle, disp, 21 G (BD REGULAR BEVEL NEEDLES) 21 gauge x 1 1/2 Ndle Use as directed with inhaled Colistin 100 each 3    omeprazole (PRILOSEC) 20 MG capsule Take 1 capsule (20 mg total) by mouth Two (2) times a day.      OXYGEN-AIR DELIVERY SYSTEMS MISC Inhale 2 mL.      sodium chloride (BD POSIFLUSH NORMAL SALINE 0.9) 0.9 % injection Inject 2mL of 0.9%NaCl into colistin vial & gently mix. After withdrawing colistin dose, add an additional 1mL of 0.9%NaCl to neb cup with the colistin dose. 180 mL 11    sodium chloride 10 % Nebu Measure and Inhale 5 mL by nebulization Two (2) times a day. Discard remaining amount and use a new vial for each dose. 900 mL 3    sterile water Soln Use 2mL to mix Colistin, then add additional 2mL to neb cup with 2mL of mixed colistin. Inhale twice daily 28 days on and 28 days off. 600 mL 5    syringe with needle (BD LUER-LOK SYRINGE) 3 mL 20 gauge x 1 1/2 Syrg For use with inhaled antibiotic (Colistin) 60 each 30    syringe with needle (BD LUER-LOK SYRINGE) 3 mL 21 gauge x 1 Syrg For use with inhaled antibiotic (Colistin) 60 each 5    tiotropium bromide (SPIRIVA RESPIMAT) 2.5 mcg/actuation inhalation mist Inhale 2 puffs daily. 12 g 3    tobramycin, PF, ([name]) 300 mg/5 mL nebulizer solution Inhale the contents of 1 ampule (300 mg total) by nebulization every twelve (12) hours. 28 days on and 28 days off. 280 mL 5    zinc gluconate 50 mg (7 mg elemental zinc) tablet Take 1 tablet (50 mg total) by mouth.       No current facility-administered medications for this encounter.         OBJECTIVE  Baseline Assessment  Temperature Spikes Noted: No  History of Intubation: No  Behavior/Cognition: Alert, Cooperative, Pleasant mood  Vision: Functional for self-feeding  Positioning : Upright in chair  Vocal Quality: Normal  Volitional Swallow: Within Functional Limits    Oral/Motor  Labial ROM: Within Functional Limits  Labial Symmetry: Within Functional Limits  Labial Strength: Within Functional Limits  Lingual ROM: Within Functional Limits  Lingual Symmetry: Within Functional Limits  Lingual Strength: Within Functional Limits  Lingual Sensation: Within Functional Limits  Velum: Within Functional Limits  Mandible: Within Functional Limits  Facial ROM: Within Functional Limits  Facial Symmetry: Within Functional Limits  Facial Strength: Within Functional Limits  Facial Sensation: Within Functional Limits  Vocal Intensity: Within Functional Limits  Apraxia: None present  Dysarthria: None present  Intelligibility: Intelligible  Breath Support: Adequate for speech  Dentition: Adequate  Oral Mechanism : normal oral and peripheral    Bolus Trials  Bolus Trials: puree, solid, thin liquid    Duration of Visit: 45    I attest that I have reviewed the above information.  Signed: Edwin Cap, CCC-SLP  03/13/2022 4:50 PM

## 2022-03-17 DIAGNOSIS — G4733 Obstructive sleep apnea (adult) (pediatric): Secondary | ICD-10-CM | POA: Diagnosis not present

## 2022-03-17 DIAGNOSIS — J479 Bronchiectasis, uncomplicated: Secondary | ICD-10-CM | POA: Diagnosis not present

## 2022-03-17 DIAGNOSIS — J449 Chronic obstructive pulmonary disease, unspecified: Secondary | ICD-10-CM | POA: Diagnosis not present

## 2022-03-24 DIAGNOSIS — J471 Bronchiectasis with (acute) exacerbation: Secondary | ICD-10-CM | POA: Diagnosis not present

## 2022-03-24 DIAGNOSIS — J479 Bronchiectasis, uncomplicated: Secondary | ICD-10-CM | POA: Diagnosis not present

## 2022-03-24 DIAGNOSIS — G4733 Obstructive sleep apnea (adult) (pediatric): Secondary | ICD-10-CM | POA: Diagnosis not present

## 2022-03-26 DIAGNOSIS — J479 Bronchiectasis, uncomplicated: Principal | ICD-10-CM

## 2022-03-26 MED ORDER — SYMBICORT 80 MCG-4.5 MCG/ACTUATION HFA AEROSOL INHALER
3 refills | 0 days
Start: 2022-03-26 — End: ?

## 2022-03-26 NOTE — Unmapped (Signed)
Initiated for ordering purposes

## 2022-03-27 DIAGNOSIS — J479 Bronchiectasis, uncomplicated: Principal | ICD-10-CM

## 2022-03-27 MED ORDER — CIPROFLOXACIN 750 MG TABLET
ORAL_TABLET | Freq: Two times a day (BID) | ORAL | 0 refills | 14 days | Status: CP
Start: 2022-03-27 — End: ?

## 2022-03-27 NOTE — Unmapped (Signed)
Initiated for ordering purposes

## 2022-04-01 MED ORDER — SYMBICORT 80 MCG-4.5 MCG/ACTUATION HFA AEROSOL INHALER
3 refills | 0 days | Status: CP
Start: 2022-04-01 — End: ?

## 2022-04-08 NOTE — Unmapped (Signed)
[facility] Refill Coordination Note    Specialty Medication(s) to be Shipped:   CF/Pulmonary/Asthma: -Generic inhaled tobramycin 300mg /44mL inhalation solution    Other medication(s) to be shipped: No additional medications requested for fill at this time     Jordan [name], DOB: 1955-10-13  Phone: [phone] (home)       All above HIPAA information was verified with patient.     Was a Nurse, learning disability used for this call? No    Completed refill call assessment today to schedule patient's medication shipment from the [facility] [phone]).  All relevant notes have been reviewed.     Specialty medication(s) and dose(s) confirmed: Regimen is correct and unchanged.   Changes to medications: Jordan [name] reports no changes at this time.  Changes to insurance: No  New side effects reported not previously addressed with a pharmacist or physician: None reported  Questions for the pharmacist: No    Confirmed patient received a Conservation officer, historic buildings and a Surveyor, mining with first shipment. The patient will receive a drug information handout for each medication shipped and additional FDA Medication Guides as required.       DISEASE/MEDICATION-SPECIFIC INFORMATION        For CF patients: CF [employer] [facility] Active? No-not enrolled    SPECIALTY MEDICATION ADHERENCE     Medication Adherence    Patient reported X missed doses in the last month: 0  Specialty Medication: tobramycin (PF) 300 mg/5 mL nebulizer solution ([name])  Patient is on additional specialty medications: Yes  Additional Specialty Medications: colistimethate 150 mg injection (COLYMYCIN)            Support network for adherence: family member                    Were doses missed due to medication being on hold? No    tobramycin (PF) 300 mg/5 mL nebulizer solution ([name])  : 0 days of medicine on hand   colistimethate 150 mg injection (COLYMYCIN)  : 10 days of medicine on hand   dornase alfa 1 mg/mL nebulizer solution (PULMOZYME) 30: days of medicine on hand       REFERRAL TO PHARMACIST     Referral to the pharmacist: Not needed      SHIPPING     Shipping address confirmed in Epic.     Delivery Scheduled: Yes, Expected medication delivery date: 04/15/22.     Medication will be delivered via UPS to the prescription address in Epic WAM.    Jordan [name]' W Jordan [name] Shared [facility] Specialty Technician

## 2022-04-09 DIAGNOSIS — N400 Enlarged prostate without lower urinary tract symptoms: Secondary | ICD-10-CM | POA: Diagnosis not present

## 2022-04-09 DIAGNOSIS — G8929 Other chronic pain: Secondary | ICD-10-CM | POA: Diagnosis not present

## 2022-04-09 DIAGNOSIS — M159 Polyosteoarthritis, unspecified: Secondary | ICD-10-CM | POA: Diagnosis not present

## 2022-04-09 DIAGNOSIS — J479 Bronchiectasis, uncomplicated: Secondary | ICD-10-CM | POA: Diagnosis not present

## 2022-04-09 DIAGNOSIS — E785 Hyperlipidemia, unspecified: Secondary | ICD-10-CM | POA: Diagnosis not present

## 2022-04-13 ENCOUNTER — Ambulatory Visit: Admit: 2022-04-13 | Discharge: 2022-04-14 | Payer: PRIVATE HEALTH INSURANCE

## 2022-04-13 DIAGNOSIS — I4891 Unspecified atrial fibrillation: Secondary | ICD-10-CM | POA: Diagnosis not present

## 2022-04-13 DIAGNOSIS — L812 Freckles: Secondary | ICD-10-CM | POA: Diagnosis not present

## 2022-04-13 DIAGNOSIS — I10 Essential (primary) hypertension: Secondary | ICD-10-CM | POA: Diagnosis not present

## 2022-04-13 DIAGNOSIS — L853 Xerosis cutis: Secondary | ICD-10-CM | POA: Diagnosis not present

## 2022-04-13 DIAGNOSIS — J479 Bronchiectasis, uncomplicated: Secondary | ICD-10-CM | POA: Diagnosis not present

## 2022-04-13 DIAGNOSIS — R609 Edema, unspecified: Secondary | ICD-10-CM | POA: Diagnosis not present

## 2022-04-13 DIAGNOSIS — Z85828 Personal history of other malignant neoplasm of skin: Secondary | ICD-10-CM | POA: Diagnosis not present

## 2022-04-13 DIAGNOSIS — D485 Neoplasm of uncertain behavior of skin: Secondary | ICD-10-CM | POA: Diagnosis not present

## 2022-04-13 DIAGNOSIS — R002 Palpitations: Secondary | ICD-10-CM | POA: Diagnosis not present

## 2022-04-13 DIAGNOSIS — D1801 Hemangioma of skin and subcutaneous tissue: Secondary | ICD-10-CM | POA: Diagnosis not present

## 2022-04-13 DIAGNOSIS — R0602 Shortness of breath: Secondary | ICD-10-CM | POA: Diagnosis not present

## 2022-04-13 DIAGNOSIS — Z9981 Dependence on supplemental oxygen: Secondary | ICD-10-CM | POA: Diagnosis not present

## 2022-04-13 DIAGNOSIS — G4733 Obstructive sleep apnea (adult) (pediatric): Secondary | ICD-10-CM | POA: Diagnosis not present

## 2022-04-13 DIAGNOSIS — R0609 Other forms of dyspnea: Secondary | ICD-10-CM | POA: Diagnosis not present

## 2022-04-13 DIAGNOSIS — J9611 Chronic respiratory failure with hypoxia: Secondary | ICD-10-CM | POA: Diagnosis not present

## 2022-04-13 DIAGNOSIS — D0461 Carcinoma in situ of skin of right upper limb, including shoulder: Secondary | ICD-10-CM | POA: Diagnosis not present

## 2022-04-13 DIAGNOSIS — I471 Supraventricular tachycardia, unspecified: Secondary | ICD-10-CM | POA: Diagnosis not present

## 2022-04-13 DIAGNOSIS — L821 Other seborrheic keratosis: Secondary | ICD-10-CM | POA: Diagnosis not present

## 2022-04-13 DIAGNOSIS — L570 Actinic keratosis: Secondary | ICD-10-CM | POA: Diagnosis not present

## 2022-04-13 NOTE — Unmapped (Signed)
Patient has been ordered a Zio    Patient given instructions and education regarding Zio.   Printed material provided to patient.   Verbalized understanding.   Interpretor used? No.  Start date: 04/13/22  Removal date: 04/27/22  SN: [id number]

## 2022-04-13 NOTE — Unmapped (Signed)
DIVISION OF CARDIOLOGY  [facility] [place]        Date of Service: 04/13/2022      PCP: Referring Provider:   Irving Copas, MD  [address] [place] [address]  Phone: [phone]  Fax: [phone] Rudene Anda, MD  [address]  [address] [place],  [place] [address]  Phone: [phone]  Fax: [phone]     Assessment and Plan:   # Paroxysmal Supraventricular Tachycardia  # Palpitations  # Possible Afib vs. MAT  Jordan [name] is a 67 year old M with extensive pulmonary disease; bronchiectasis, chronic pseudomonal colonization, asperilloma, chronic hypoxic respiratory failure. He feels his disease is progressing with worsening breathlessness and coughing.  CV risk factors include HTN, OSA, age, male gender.  He has a prior dx of PSVT (AFib) late 2022 while in pulm rehab wearing a monitor.  He is on Dilt 120 ER for BP control and not rhythm control to his knowledge.      TTE reassuringly showed a preserved L and R heart.  As such, I do not see an need for a standing diuretic.     We will proceed with a Zio patch to assess burden of AF or other SVTs.     At this point in time, if this SVT is discovered to be AFib, anticoagulation is not benign in Jordan [name]. He has has a history of PA embolization Dec 2022 and would likely be at elevated risk of bleeding as he re-grows collaterals and has ongoing progression of his lung disease. After shared decision making, we agreed to hold off on anticoagulation. If this were to be AF in the setting of an elevated bleed risk with recent bleed within the year and a low Chads 2 Vasc of 2 (age, HTN) - we agreed to hold [facility] for the time being. If AF is found, we spoke about a possible Watchman procedure, wihich he is ammendable to to help decrease his risk of CVA.     Plan:  -Zio patch +/- possible Watchman if needed   -Continue Dilt 120 ER for HTN and addition of AV nodal blockade.   -Plan to hold and re-assess fluid status off Lasix. No echocardiographic signs that it is needed.    # Lower extremity Edema  # Shortness of breath on exertion  # Chronic hypoxic respiratory failure on 2L at night and w/ activity  # Severe bronchiectasis and pseudomonal colonization.   # Likely R heart disease and Pulmonary HTN.   Notes improvement in his lower extremity edema. Weights have been stable. With his severe pulmonary disease and chronic hypoxic respiratory failure, I suspected elevated R heart pressures or signs of R heart strain. However, TTE 01/12/2022 without any significnat R heart strain. This is quite reassuring, as such, I question the need for a daily diuretic (without L or R heart disease at this time).   Plan:  -Trial 7 days off of Lasix and daily weights.    -Reassess at that time.      Primary prevention parameters:  - The 10-year ASCVD risk score (Arnett DK, et al., 2019) is: 14.6%    Values used to calculate the score:      Age: 58 years      Sex: Male      Is Non-Hispanic African American: No      Diabetic: No      Tobacco smoker: No  Systolic Blood Pressure: 106 mmHg      Is BP treated: Yes      HDL Cholesterol: 46 mg/dL      Total Cholesterol: 217 mg/dL    Note: For patients with SBP <90 or >200, Total Cholesterol <130 or >320, HDL <20 or >100 which are outside of the allowable range, the calculator will use these upper or lower values to calculate the patient???s risk score.    - No results found for: LDL    -   Lab Results   Component Value Date    A1C 5.3 01/05/2022       -   Lab Results   Component Value Date    CREATININE 0.93 12/22/2021       No follow-ups on file.      Subjective:        Reason for Consultation: PSVT    History of Present Illness: Jordan [name] is a very pleasant 67 y.o. male with a history of HTN, OSA, brochiectasis c/b chronic pseudomonas colonization and chronic hypoxic respiratory failure req 2L O2 with activity, history of RML lobectomy/RLL wedge recection, RUL aspergillooma .The patient is seen at the request of Rudene Anda for evaluation of PSVT (Afib) which was discovered late 2022 during pulmonary rehab.    RPA embolization in December 2022 for hemoptysis.       Cardiology is asked to see Jordan [name] for a history of paroxysmal SVT.  Unclear which SVT this is as it has not been captured on Zio or ECG.  CT-A Chest 03/2021 demonstrated extensive bronchiectasis and signs of ongoing endobronchial infection.  Per chart review, PSVT managed with Dilt ER 120mg . While in pulmonary rehab 8/11, notes of 40 events of fast rate or afib.      October 3rd. Dilt ER 120.     No HF or CHF. He uses 2-3 thin pillows at night and has not changed them recently.  With oxygen he is able to do yard work. Even with O2 he has to takes breaks and has it down to 87%. 2L at home , 4L with activity. No CP or Chest heaviness.     He is unaware that he is in afib. He would see the pulm rehab monitor tell him he was in Afib.  His most recent hospital stays he was not in AFib and his heart rate was always below 90s. No Paplps, or pre-syncoal features.      Interval:    -Lots more coughing and congestion. No new fever. Feeling weaker than normal. Off Voriconazole now per Dr. Garner Nash. Recently started on Mirtazapine back in April.  Legs are doing okay. Improved edema.   -Neuropathy in toes.   -Get around No physical labor. Back and to the doctor. Goes to church once a week. Walks on his own, has to stop quite frequently.   -On standing quickly, sometimes is lightheaded.  Usually with position change.   -No palpitations.   -O2 2L at rest and 4 with activity, lately the 4 is not helping as much as it used to.   -Dr. Garner Nash would like to defer the lasix to cardiology.     Cardiovascular History:  PSVT, unclear type. Likely AFib vs. MAT.     Fam Hx:  Father - CHF (late 62s).   Mother - AFib    I have independently reviewed of all the diagnostic studies. I have reviewed the old medical records from [facility] prior to this clinic visit.  Cardiovascular Studies Date Comments     ECG 12/22/2021 NSR. LAFB.   Echo 01/12/2022 Summary    1. Technically difficult study due to chest wall/lung interference.  No  parasternal windows available.    2. The left ventricular systolic function is normal, LVEF is visually  estimated at 55%.    3. There is mild aortic regurgitation.    4. The right ventricle is normal in size, with normal systolic function.   Stress test     Cardiac catheterization     [id number] Genotype     Electrophysiology      Cardiovascular Surgery     Peripheral Vascular Studies     I have personally reviewed the recent imaging studies on this patient.    Past medical history:  Patient Active Problem List   Diagnosis    Bronchiectasis (CMS-HCC)    Esophagitis    Pseudomonas aeruginosa infection    Daytime somnolence    Memory loss    Hypersomnia with sleep apnea    Essential hypertension    Osteoarthrosis    Dizziness    Excessive daytime sleepiness    Sleep disturbances    Loculated pleural effusion    Moderate Pharyngeal dysphagia    IDA (iron deficiency anemia)    Disequilibrium    Gastroesophageal reflux disease without esophagitis    Hoarseness    Dyspnea on exertion    Chronic respiratory failure with hypoxia (CMS-HCC)    Hemoptysis    Pulmonary aspergilloma (CMS-HCC)    Sensory neuropathy    Lumbosacral radiculopathy    Other osteoporosis without current pathological fracture       Medications:   Patient's Medications   New Prescriptions    No medications on file   Previous Medications    ALBUTEROL 2.5 MG /3 ML (0.083 %) NEBULIZER SOLUTION    INHALE 3 ML (2.5 MG TOTAL) BY NEBULIZATION TWO (2) TIMES A DAY.    ALBUTEROL HFA 90 MCG/ACTUATION INHALER    Inhale 2 puffs Four (4) times a day.    ALCOHOL SWABS (ALCOHOL PREP PADS) PADM    Use as directed with inhaled antibiotics    ALENDRONATE (FOSAMAX) 70 MG TABLET    TAKE 1 TABLET BY MOUTH EVERY 7 DAYS FOR 12 DOSES    ASCORBIC ACID, VITAMIN C, (VITAMIN C) 1000 MG TABLET    Take 1 tablet (1,000 mg total) by mouth daily.    ATORVASTATIN (LIPITOR) 10 MG TABLET    Take 1 tablet (10 mg total) by mouth daily.    AZITHROMYCIN (ZITHROMAX) 250 MG TABLET    TAKE 1 TABLET BY MOUTH EVERY DAY    BUDESONIDE-FORMOTEROL (SYMBICORT) 80-4.5 MCG/ACTUATION INHALER    INHALE 2 PUFFS TWO TIMES A DAY.    CALCIUM CARBONATE (CALCIUM 600 ORAL)    Take 2 tablets by mouth two (2) times a day. 1 in morning and 1 in the evening    CIPROFLOXACIN HCL (CIPRO) 750 MG TABLET    Take 1 tablet (750 mg total) by mouth two (2) times a day.    COENZYME Q10 300 MG CAP    Take 10-300 mg by mouth daily.    COLISTIMETHATE (COLYMYCIN) 150 MG INJECTION    Inject 2 mL sterile water for injection to mix colistin vial, then draw up 2 mL (150mg ) and inhale 2 times a day, 28 days on and 28 days off.    DILTIAZEM (CARDIZEM CD) 120 MG 24 HR CAPSULE    Take 1 capsule (120 mg  total) by mouth daily.    DORNASE ALFA (PULMOZYME) 1 MG/ML NEBULIZER SOLUTION    Inhale 1 ampule (2.5 mg) daily. Use at least 30-60 minutes before airway clearance, or after airway clearance    EMPTY CONTAINER (SHARPS CONTAINER) MISC    USE AS DIRECTED    EMPTY CONTAINER MISC    USE AS DIRECTED    EMPTY CONTAINER MISC    Use as directed to dispose of needles. When full, make sure lid is closed tightly then dispose of container in trash.    FERROUS SULFATE 325 (65 FE) MG TABLET    Take 1 tablet (325 mg total) by mouth Two (2) times a day. BID    FLUTICASONE PROPIONATE (FLONASE) 50 MCG/ACTUATION NASAL SPRAY    1 spray into each nostril daily.    FUROSEMIDE (LASIX) 20 MG TABLET    TAKE 1 TABLET(20 MG) BY MOUTH DAILY    HYDROCODONE-ACETAMINOPHEN (NORCO) 7.5-325 MG PER TABLET    Take 1 tablet by mouth three (3) times a day (at 6am, noon and 6pm).    L. ACIDOPHILUS/BIFID. ANIMALIS 32 BILLION CELL CAP    Take 2 capsules by mouth daily.    MONTELUKAST (SINGULAIR) 10 MG TABLET    TAKE 1 TABLET BY MOUTH EVERY DAY AT NIGHT    MULTIVITAMIN-MINERALS-LUTEIN TAB    Take 1 tablet by mouth daily. NEBULIZERS (LC PLUS) MISC    Use with inhaled medications    NEBULIZERS MISC    use with nebulized medications    NEEDLE, DISP, 21 G (BD REGULAR BEVEL NEEDLES) 21 GAUGE X 1 1/2 NDLE    Use as directed with inhaled Colistin    OMEPRAZOLE (PRILOSEC) 20 MG CAPSULE    Take 1 capsule (20 mg total) by mouth Two (2) times a day.    OXYGEN-AIR DELIVERY SYSTEMS MISC    Inhale 2 mL.    SODIUM CHLORIDE (BD POSIFLUSH NORMAL SALINE 0.9) 0.9 % INJECTION    Inject 2mL of 0.9%NaCl into colistin vial & gently mix. After withdrawing colistin dose, add an additional 1mL of 0.9%NaCl to neb cup with the colistin dose.    SODIUM CHLORIDE 10 % NEBU    Measure and Inhale 5 mL by nebulization Two (2) times a day. Discard remaining amount and use a new vial for each dose.    STERILE WATER SOLN    Use 2mL to mix Colistin, then add additional 2mL to neb cup with 2mL of mixed colistin. Inhale twice daily 28 days on and 28 days off.    SYRINGE WITH NEEDLE (BD LUER-LOK SYRINGE) 3 ML 20 GAUGE X 1 1/2 SYRG    For use with inhaled antibiotic (Colistin)    SYRINGE WITH NEEDLE (BD LUER-LOK SYRINGE) 3 ML 21 GAUGE X 1 SYRG    For use with inhaled antibiotic (Colistin)    TIOTROPIUM BROMIDE (SPIRIVA RESPIMAT) 2.5 MCG/ACTUATION INHALATION MIST    Inhale 2 puffs daily.    TOBRAMYCIN, PF, ([name]) 300 MG/5 ML NEBULIZER SOLUTION    Inhale the contents of 1 ampule (300 mg total) by nebulization every twelve (12) hours. 28 days on and 28 days off.    ZINC GLUCONATE 50 MG (7 MG ELEMENTAL ZINC) TABLET    Take 1 tablet (50 mg total) by mouth.   Modified Medications    No medications on file   Discontinued Medications    No medications on file       Allergies:  Allergies   Allergen Reactions  Gentamicin Other (See Comments)     BALANCE ISSUES    Tobramycin Other (See Comments)     ototoxicity    Morphine Nausea And Vomiting       Social History:  He  reports that he has never smoked. He has never used smokeless tobacco. He reports that he does not drink alcohol and does not use drugs.    Family History:  His family history includes Alzheimer's disease in his mother; Asthma in his son; Bronchiectasis  in his brother, brother, and daughter; Diabetes in his father; Heart failure in his father; Liver disease in his father; Stroke in his mother.    Review of Systems  10 systems were reviewed and negative except as noted in HPI.      Objective:       Physical Exam  There were no vitals taken for this visit.   Wt Readings from Last 3 Encounters:   02/26/22 82.1 kg (181 lb)   01/13/22 84.2 kg (185 lb 9.6 oz)   01/05/22 86.4 kg (190 lb 6.4 oz)       General:  Alert, no distress.   Eyes:  Intact, sclerae anicteric.   Neck: No carotid bruit.    Respiratory:   CTAB bilaterally with normal WOB.   Cardiovascular:  No carotid bruit, JVD 5 cm at 90 degrees, RRR without m/r/g.  1+ edema bilaterally.       Most recent labs   Lab Results   Component Value Date    Sodium 140 12/22/2021    Sodium Whole Blood 137 03/24/2015    Potassium 4.0 12/22/2021    Potassium, Bld 3.9 03/24/2015    Chloride 105 12/22/2021    Chloride 93 (L) 08/13/2013    CO2 29.0 12/22/2021    CO2 32 (H) 08/13/2013    BUN 18 12/22/2021    BUN 24 (H) 08/13/2013    Creatinine Whole Blood, POC 1.2 04/15/2021    Creatinine Whole Blood, POC 0.4 (L) 06/12/2014    Creatinine 0.93 12/22/2021    Magnesium 1.5 (L) 12/22/2021    Magnesium 2.0 08/13/2013     Lab Results   Component Value Date    HGB 11.8 (L) 11/20/2020    Hemoglobin 14.8 03/24/2015    MCV 90.4 11/20/2020    MCV 88 08/13/2013    Platelet 134 (L) 11/20/2020    Platelet 292 08/13/2013     Lab Results   Component Value Date    Hemoglobin A1C 5.3 01/05/2022    TSH 1.334 10/14/2021    INR 0.95 04/14/2017    INR 1.0 08/03/2013          A total of 35 minutes was spent on this visit reviewing previous notes, counseling the patient on this clinical conditions, ordering tests , adjusting meds, and documenting the findings in the note.       Patient note was created using dragon dictation. Any errors in syntax or proofreading may not have been identified and edited on initial review prior to signing this note.

## 2022-04-14 ENCOUNTER — Ambulatory Visit: Admit: 2022-04-14 | Discharge: 2022-04-15 | Payer: PRIVATE HEALTH INSURANCE

## 2022-04-14 ENCOUNTER — Ambulatory Visit
Admit: 2022-04-14 | Discharge: 2022-04-15 | Payer: PRIVATE HEALTH INSURANCE | Attending: Internal Medicine | Primary: Internal Medicine

## 2022-04-14 DIAGNOSIS — J479 Bronchiectasis, uncomplicated: Principal | ICD-10-CM

## 2022-04-14 DIAGNOSIS — J9611 Chronic respiratory failure with hypoxia: Secondary | ICD-10-CM | POA: Diagnosis not present

## 2022-04-14 DIAGNOSIS — J471 Bronchiectasis with (acute) exacerbation: Secondary | ICD-10-CM | POA: Diagnosis not present

## 2022-04-14 DIAGNOSIS — A498 Other bacterial infections of unspecified site: Secondary | ICD-10-CM | POA: Diagnosis not present

## 2022-04-14 MED FILL — TOBRAMYCIN 300 MG/5 ML IN 0.225 % SODIUM CHLORIDE FOR NEBULIZATION: RESPIRATORY_TRACT | 28 days supply | Qty: 280 | Fill #2

## 2022-04-14 NOTE — Unmapped (Unsigned)
[place] [facility]   Assessment:      Patient:Jordan [name] ([birth year])    Jordan [name] is a 67 y.o. male who is seen for follow up of bronchiectasis with chronic Pseudomonas colonization and chronic hypoxemic respiratory failure requiring supplemental oxygen with exertion and sleep. After admission to [facility] for worsening dyspnea despite oral antibiotics, developed hemoptysis. 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  Also found to have tortuous bronchial artery supplying the RUL; s/p BAE 07/11/21. Developed peripheral neuropathy initially postulated to be due to voriconazole.  However, after it failed to improve with dose reduction and he developed lower extremity edema, was concerned that it was due to absorption of colistin.  This was ruled out by his tobramycin level after nebulizing [name].  Normal albumin/protein levels and lack of response to lasix suggests potential venous obstruction in abd/pelvis.      Plan:      Problem List Items Addressed This Visit          Respiratory    Bronchiectasis (CMS-HCC) - Primary    Chronic respiratory failure with hypoxia (CMS-HCC)       Other    Pseudomonas aeruginosa infection     Therapies:  Patient did not need to meet with the respiratory therapist today.  No changes were made to inhaled medications nor airway clearance regimen.  He will let me know if he notices any changes to his breathing being off the Pulmozyme.  If no difference off it, would be reasonable to stop it to decrease treatment burden.  Stopping voriconazole since no change in size of mycetoma on subtherapeutic voriconazole and had side effects on higher doses.  Continuing current lasix dosing since reporting benefit with nocturia.  Wouldn't increase dose since hasn't seemed to improve edema despite noticing diuretic response.    Testing:  Patient was unable to expectorate sputum today to be sent for bacterial and AFB cultures.  If he has future exacerbations, sputum should be sent for bacterial and AFB cultures. The bacterial culture should be processed like a cystic fibrosis sample given the overlapping pathogens. There are standing orders for these cultures in our system.  Non-contrast chest CT for re-evaluation of mycetoma to be done in 6 months.  Checking BMP, Magnesium, Cystatin C - most recent Cr of 1.17 .  CT abdomen/pelvis with contrast to be done at [facility].    Referrals:  Referred to endocrinology at [facility] for evaluation and management of osteoporosis.  His local endocrinologist office is moving across town, and he would like to move his care to [name].    Vaccinations:  COVID-19: Recommended in fall  Flu: Recommended in fall  Pneumococcal: Up to date  Tdap: Up to date  RSV: Recommended in fall    Duel will return to clinic in 3 months for follow-up with pre-bronchodilator spirometry and sputum cultures.  He will call or send me a message via MyChart if questions or concerns arise before this visit. Jordan [name] is in agreement with the above plan.     Subjective:      HPI: Jordan [name] is a 67 y.o. male who is seen for follow up of bronchiectasis with PsA colonization and prior history of multiple lung resections.    09/27/18:  I last saw him on 05/17/18. He reached out at the end of May reporting symptoms c/w an exacerbation. I treated him with a two week course of Ciprofloxacin, which seemed to help.  Reports  that he feels winded too much - if walks up incline or up a couple hundred feet. This has worsened over the past year. Has to stop and let lungs catch up.  Of note, he has been off testosterone supplementation for the past year after he was diagnosed with prostate cancer.    He received the [facility], which he is using with nebulized HTS 7% and Pulmozyme. After using the Vest for 6-8 weeks, he noticed improvement with more phlegm mobilization. Sometimes has to pause it to cough to get stuff up.  Plugging feeling happening a lot more, especially with the vest.  Most of the time, sputum is thinned out with Pulmozyme but still gets those plugs.    Had to stop eating ice cream or drinking milkshake - will cough will eating and that makes him vomit on occasion. Past few weeks, when eating will cough, no matter what he eats.  Happens almost once a day. Doesn't feel like things get stuck typically.      02/07/19:  Following his last visit, Jordan [name] reached out regarding portable oxygen.  We had considered the life 2000 but he does not qualify because he does not use supplemental oxygen.  He performed the 6-minute walk test prior to today's visit but did not desaturate.  He wonders if this is because the test was done in the morning and he doesn't have breathing issues in the morning.  Typically, breathing issues occur in afternoon.  During these episodes, he will have to use his inhaler and feels like he is suffocating.  He describes it as an uncomfortable feeling that limits his ability to do things with family.  If he is having trouble catching breath, oximetry is 92-93%. Has seen it in the 80s if comes in from outside and is short of breath.    For airway clearance, he is nebulizing 10% hypertonic saline and using his percussive vest for 20 minute sessions in AM and PM. Has to pause it to cough and get stuff up.  Coughing up more stuff since last visit and is waking up 1-2 times a night with cough.having coughing fits between clearance session.  What he expectorates is less of a mucous plug and mostly stringy sputum that is yellowish to light brown in color.  No hemoptysis.  On Saturday, he had a coughing fit that nearly triggered posttussive emesis.  He only expectorated about 3-4 globs of phlegm.  Nebulizing Pulmozyme daily.  Alternates [name] and colistin every other month.  On [name] this month and will start Colistin on Dec 1.    06/06/19:  Coughing more and more sputum.  Waking up 1-2 times per night coughing. Can cough once or just keep coughing.  Sputum is yellow in color without evidence of blood.  He is not expectorating green sputum currently. On colistin this month; alternates with [name]. Went through January off all inhaled antibiotics.  Oxygen levels in the low 90s.  Uses rescue inhaler if goes up stairs.  Regularly performing airway clearance with nebulized 10% hypertonic saline twice a day, percussive vest twice a day, and Pulmozyme daily. He last received IV antibiotics in January 2019 but has been treated with oral antibiotics on 04/19/18 and 08/17/18, 03/10/19.      11/10/19:  1-2 times per week can get sharp right sided chest pain - even with nipple line. Can happen with clearance or activity or rest. Never during sleep. Following his hospitalization, he first experienced it in June. Lasts only  about 10 seconds. Not reproducible. Limited activity due to dyspnea. Regularly performing airway clearance with nebulized 10% hypertonic saline twice a day, percussive vest twice a day, and Pulmozyme daily. Remains on chronic azithromycin therapy.    02/13/20:  Showed up to [facility] with oxygen saturations of 87-88% after walking from parking lot (<100 yd) and up incline without supplemental oxygen.  Using 2L continuous flow with exertion and sleep.  Had sleep study done at [facility] on RA and was told that he should be fine with just the supplemental oxygen. I do not have this report. Using oxygen when goes to football game; needs long period to recover from activity.  Going up 20 foot incline with oxygen, will have to stop and catch breath.     Wheezing more lately and endorses chest tightness. Has need to use albuterol as rescue 3-4 times per day.  Used albuterol in the car on the way here. Typically would be able to cough up large amount of mucus after PFTs but only coughed up small amount. Feels like mucus is stuck in lungs despite twice daily airway clearance with 10% HTS and Vest. Also using Pulmozyme once a day and Symbicort twice a day after clearance.  Using colistin this month. He ordered the [name] nebs to use next month even though insurance won't cover it. Feels like inhaled antibiotics definitely help.    05/10/20:   Saw [facility] for lung transplant evaluation.  Felt to be poor candidate due to prior surgeries and primary disease.  Candidacy closed at [facility]. Referral sent to [facility] and [facility] on 2/15.  Based on labs and testing done there, getting Hep B vaccination series (not Hep A or Hep B immune).  Also getting DEXA that was ordered by Dr. Abigail Miyamoto.    Had PSG done at [facility] - no evidence of OSA but does have increased upper airway resistance.  He never sleeps on his back; only sleeps on side or stomach.   Fatigue is most burdensome symptom.    Regrets that he let the surgeon operate on his lung before he met me.  He completed pulmonary rehab at [facility] but they only had him on machine for ~30 minutes and then stretching.  No strength exercises. He is planning on joining a gym based on recommendations from [facility] transplant.  Was surprised that he didn't desaturate during his walk test at [facility].    07/02/20:  At last visit, referred him to [facility] for pulmonary rehab. Found to have osteoporosis in hips. Turned down for transplant at [facility] based on chart review.  No change in breathing or cough.  Every once in a while, has bad coughing spells.  Same sputum volume and color. Globby to stringy. Not dark brown or bloody.  Sputum is light brown to yellowish.  Breathing about the same.  Started on 1200 mg calcium for osteoporosis.  Starting telehealth pulmonary rehab - feels optimistic that it will help him do more.    10/31/20:  Heart monitor for pulm rehab - had 40 events of fast rate or afib.  Has felt heart fluttering.  Did event monitor for 8 days.  Getting echocardiogram on Wednesday and then seeing cardiologist the next week.     Was having sharp pains over right abdomen below ribs.  Once a month, now once every 2 weeks.    03/18/21:  Since his last clinic visit, he was evaluated by cardiology at [facility] (October) and  then was admitted to [facility] earlier this month due to hemoptysis that occurred while on ciprofloxacin for an exacerbation.  He also had worsening shortness of breath.  Was discharged home on 21 days of meropenem.  Did not undergo embolization or bronchoscopy.     Hasn't felt good since August.  No energy, lost weight. End Nov, asked for Cipro and for few days felt better. Then didn't feel better and was ordeal to see PCP.  WBC elevated in ER - they called me. Friday night/Saturday started seeing dark blood in sputum.  Monday night 8PM, got breathing treatment with bright red globs.  Large volume blood and phlegm. They were going to do bronchoscopy but then bleeding improved with change in abx, holding 10% saline. Was continued on percussive vest and Pulmozyme. Friday to Saturday, wine colored.  Then pinkish. Sunday night could barely see blood and then gone Monday.    Finished Meropenem last night (3 weeks total). Christmas eve morning, started seeing bright red streaks. Yesterday, coughed up bright red blood that was 50/50 with phlegm.  Today more wine colored.   Shortness of breath better with meropenem but no change in hemoptysis.    07/01/21:  After last visit, we attempted to perform bronchoscopy but due to hypoxemia, we canceled the procedure.  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  He also had a visit with Dr. Erven Colla in interventional radiology and is scheduled for bronchial artery embolization later this month.    Feels off balance but not dizzy/spinning.  This has been intensifying with increasing the voriconazole dose. Happening daily every time he stands up. No hearing changes. Head feels heavy.  No vision changes nor double visions. Sinuses up and down, bleed a little bit.  Some post-nasal drip. 10/14/21:  At last visit, pursued Life[employer] for his work of breathing. Started mirtazapine for appetite, mood, and sleep. Underwent BAE on 07/11/21. Tapering off voriconazole due to development of peripheral neuropathy and lower extremity swelling.  Had been on voriconazole for about 5 months.    Swelling started slightly in April and has progressed. Neuropathy started in tips of toes a few weeks ago but now at ankles.  More shortness of breath when laying flat.  Has woken up at night short of breath.  Work of breathing outside is better on L2K.    11/21/21:  At last visit, reporting ascending peripheral neuropathy and more rapidly progressive lower extremity edema. Planned to continue voriconazole 100 mg daily and repeat chest CT to see if any evolution of his mycetoma. No lab evidence that he is absorbing his inhaled antibiotics.    Since last visit, swelling seems to have increased.  Lasix seemed to help for 2 days but then no difference aside from no longer waking up night to urinate.  Neuropathy to the ankles bilaterally.  Feeling more swollen in legs and even abdomen feels     04/14/22:  At last visit, stopped voriconazole, ordered CT abd/pelvis, referred to endocrinology. Since then, he saw speech therapy for swallowing exercises.  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 Additional left-sided lumbosacral radiculopathy (L5-S1) of chronic nature is also noted.  Didn't notice as much improvement with Cipro.  Lots of coughing spells.  Washed out feeling.  Neg COVID tests.  No fever.  Sputum darker brown.  Varies.   Using 4L when walks.  Tank of  oxygen lasting about an hour.  Feels wet and gurgling.    Respiratory Symptoms:   Cough: coughing up more globs.  When first lays down, has to cough several times even after clearing sputum.same frequency of bad coughing spells.  Light brown.   Nocturnal awakenings: twice times this week.   Wheezing: absent.  Chest tightness: sometimes.  Rescue albuterol use: 2-3 times in past week but better after coughing up stuff.  Pleurisy: sporadic once every other day over prior resection.  Hemoptysis: Two little spots since embolization. Nothing major.  MMRC Stage: 3 = I stop for breath after walking about 100 yards or after a few minutes on level ground. Finds L2K helpful when outside.  Finds that he is able to get so immersed in what he is doing that the L2K starts beeping because either battery or oxygen tank is low.    Exacerbations:   Number of exacerbations treated in the past year: 2  Dates of exacerbations: 03/2018, 07/2018, 02/2019, 09/2019, 12/2019, 10/2020, 01/2021, 07/2021, 03/2022  Number of hospitalizations for exacerbations in the past 2 years: 2  Dates of hospitalizations: 01/2015, 03/2015, 01/2016, 03/2017, 09/2019, 10/2020, 02/2021    Pulmonary Therapies:   Airway clearance: Pulmozyme daily, HTS 10% bid, percussive vest bid.   Inhaled antibiotics: Alternates [name] and Colistin.- on Colistin today.  Inhalers: Symbicort 160/4.5, Spiriva Respimat  Chronic antibiotics: azithromycin 250 mg daily.   Exercise: Was walking 3 miles at 2.1 without stopping on the treadmill.  Pulmonary Rehab: Telehealth pulmonary rehab July 2022  Supplemental oxygen: 2L continuous when out of house and with sleep. Using L2K and finds it helpful. Increased to 4L with activity since L2K not working.  NIPPV: No    ROS: . No F/C/NS. WEight stablized and less edema now that off the mirtazidpime.    Past Medical History:   Diagnosis Date    Abscess of lung (CMS-HCC) 11/06/2010    CT Chest 10/22/10 12/05/2010 right thoracotomy resection of right middle lobe and resection of right lower lobe abscess     Arthritis     back    Biceps tendon tear 2013    left side    Bronchiectasis (CMS-HCC)     chronic psuedomonas infection    Degenerative joint disease of left acromioclavicular joint     GERD (gastroesophageal reflux disease)     Hypertension     Obstructive sleep apnea on CPAP 03/14/13    AHI 33.5, on BiPAP 12/8 based on sleep study 09/2013    Pneumonia 2012    Lung abcess     PONV (postoperative nausea and vomiting)     Prostate cancer (CMS-HCC) 09/09/2017    Rotator cuff injury left    Vertigo        Past Surgical History:   Procedure Laterality Date    APPENDECTOMY  1969    BACK SURGERY      BRONCHOSCOPY  10/19/2010    [facility]    CARPAL TUNNEL RELEASE  1999    IR EMBOLIZATION HEMORRHAGE ART OR VEN  LYMPHATIC EXTRAVASATION  07/11/2021    IR EMBOLIZATION HEMORRHAGE ART OR VEN  LYMPHATIC EXTRAVASATION 07/11/2021 Braulio Conte, MD IMG VIR H&V [facility]    LUNG REMOVAL, PARTIAL Right 10/2010    RML and RLL partially resected    MENISCECTOMY  2011    NECK SURGERY  1993, 1999, 2002    PR GERD TST W/ MUCOS IMPEDE ELECTROD,>1HR N/A 07/06/2012    Procedure: ESOPHAGEAL FUNCTION TEST, GASTROESOPHAGEAL  REFLUX TEST W/ NASAL CATHETER INTRALUMINAL IMPEDANCE ELECTRODE(S) PLACEMENT, RECORDING, ANALYSIS AND INTERPRETATION; PROLONGED;  Surgeon: None None;  [facility];  Service: Gastroenterology    PR OPEN TREAT RIB FRACTURE W/INT FIXATION, UNILATERAL, 1-2 RIBS Right 03/19/2015    Procedure: OPEN TREATMENT OF RIB FRACTURE REQUIRING INTERNAL FIXATION, UNILATERAL; 1-2 RIBS;  Surgeon: Evert Kohl, MD;  Location: MAIN OR [place];  Service: Cardiothoracic    PR THORACOTOMY W/THERAP WEDGE RESEXN ADDL IPSILATRL Right 08/07/2013    Procedure: THORACOTOMY; WITH THERAPEUTIC LOBECTOMY OF RIGHT MIDDLE AND RIGHT LOWER LOBE RESECTION , EACH ADDITIONAL RESECTION, IPSILATERAL;  Surgeon: Alvester Chou, MD;  Location: MAIN OR [place];  Service: Cardiothoracic    TONSILLECTOMY  1965       Family History   Problem Relation Age of Onset    Alzheimer's disease Mother     Stroke Mother     Bronchiectasis  Brother         presumed    Bronchiectasis  Brother         presumed    Liver disease Father     Diabetes Father     Heart failure Father     Bronchiectasis  Daughter Asthma Son         childhood asthma    Clotting disorder Neg Hx     Anesthesia problems Neg Hx        Social History     Tobacco Use    Smoking status: Never    Smokeless tobacco: Never   Substance Use Topics    Alcohol use: No     Alcohol/week: 0.0 standard drinks of alcohol    Drug use: No       Allergies  Reviewed on 04/14/2022        Reactions Comments    Gentamicin Other (See Comments) BALANCE ISSUES    Tobramycin Other (See Comments) ototoxicity    Morphine Nausea And Vomiting           Current Outpatient Medications   Medication Sig Dispense Refill    alcohol swabs (ALCOHOL PREP PADS) PadM Use as directed with inhaled antibiotics 100 each 5    ascorbic acid, vitamin C, (VITAMIN C) 1000 MG tablet Take 1 tablet (1,000 mg total) by mouth daily.      atorvastatin (LIPITOR) 10 MG tablet Take 1 tablet (10 mg total) by mouth daily.      azithromycin (ZITHROMAX) 250 MG tablet TAKE 1 TABLET BY MOUTH EVERY DAY 90 tablet 3    budesonide-formoterol (SYMBICORT) 80-4.5 mcg/actuation inhaler INHALE 2 PUFFS TWO TIMES A DAY. 30.6 g 3    calcium carbonate (CALCIUM 600 ORAL) Take 2 tablets by mouth two (2) times a day. 1 in morning and 1 in the evening      ciprofloxacin HCl (CIPRO) 750 MG tablet Take 1 tablet (750 mg total) by mouth two (2) times a day. 28 tablet 0    coenzyme Q10 300 mg cap Take 10-300 mg by mouth daily.      colistimethate (COLYMYCIN) 150 mg injection Inject 2 mL sterile water for injection to mix colistin vial, then draw up 2 mL (150mg ) and inhale 2 times a day, 28 days on and 28 days off. 60 each 5    dilTIAZem (CARDIZEM CD) 120 MG 24 hr capsule Take 1 capsule (120 mg total) by mouth daily.      dornase alfa (PULMOZYME) 1 mg/mL nebulizer solution Inhale 1 ampule (2.5 mg) daily. Use at least 30-60  minutes before airway clearance, or after airway clearance 225 mL 3    empty container (SHARPS CONTAINER) Misc USE AS DIRECTED 1 each 0    empty container Misc USE AS DIRECTED 1 each 2    empty container Misc Use as directed to dispose of needles. When full, make sure lid is closed tightly then dispose of container in trash. 1 each 2    ferrous sulfate 325 (65 FE) MG tablet Take 1 tablet (325 mg total) by mouth Two (2) times a day. BID      fluticasone propionate (FLONASE) 50 mcg/actuation nasal spray 1 spray into each nostril daily.      HYDROcodone-acetaminophen (NORCO) 7.5-325 mg per tablet Take 1 tablet by mouth three (3) times a day (at 6am, noon and 6pm).      L. acidophilus/Bifid. animalis 32 billion cell cap Take 2 capsules by mouth daily.      montelukast (SINGULAIR) 10 mg tablet TAKE 1 TABLET BY MOUTH EVERY DAY AT NIGHT 90 tablet 3    multivitamin-minerals-lutein Tab Take 1 tablet by mouth daily.      nebulizers (LC PLUS) Misc Use with inhaled medications 1 each 5    nebulizers Misc use with nebulized medications 12 each 0    needle, disp, 21 G (BD REGULAR BEVEL NEEDLES) 21 gauge x 1 1/2 Ndle Use as directed with inhaled Colistin 100 each 3    omeprazole (PRILOSEC) 20 MG capsule Take 1 capsule (20 mg total) by mouth Two (2) times a day.      OXYGEN-AIR DELIVERY SYSTEMS MISC Inhale 2 mL.      sodium chloride (BD POSIFLUSH NORMAL SALINE 0.9) 0.9 % injection Inject 2mL of 0.9%NaCl into colistin vial & gently mix. After withdrawing colistin dose, add an additional 1mL of 0.9%NaCl to neb cup with the colistin dose. 180 mL 11    sodium chloride 10 % Nebu Measure and Inhale 5 mL by nebulization Two (2) times a day. Discard remaining amount and use a new vial for each dose. 900 mL 3    sterile water Soln Use 2mL to mix Colistin, then add additional 2mL to neb cup with 2mL of mixed colistin. Inhale twice daily 28 days on and 28 days off. 600 mL 5    syringe with needle (BD LUER-LOK SYRINGE) 3 mL 20 gauge x 1 1/2 Syrg For use with inhaled antibiotic (Colistin) 60 each 30    syringe with needle (BD LUER-LOK SYRINGE) 3 mL 21 gauge x 1 Syrg For use with inhaled antibiotic (Colistin) 60 each 5    tiotropium bromide (SPIRIVA RESPIMAT) 2.5 mcg/actuation inhalation mist Inhale 2 puffs daily. 12 g 3    tobramycin, PF, ([name]) 300 mg/5 mL nebulizer solution Inhale the contents of 1 ampule (300 mg total) by nebulization every twelve (12) hours. 28 days on and 28 days off. 280 mL 5    zinc gluconate 50 mg (7 mg elemental zinc) tablet Take 1 tablet (50 mg total) by mouth.      zoledronic acid-mannitol&water (RECLAST) 5 mg/100 mL PgBk       albuterol 2.5 mg /3 mL (0.083 %) nebulizer solution INHALE 3 ML (2.5 MG TOTAL) BY NEBULIZATION TWO (2) TIMES A DAY. 525 mL 3    albuterol HFA 90 mcg/actuation inhaler Inhale 2 puffs Four (4) times a day. (Patient taking differently: Inhale 2 puffs four (4) times a day as needed.) 25.5 g 3    furosemide (LASIX) 20 MG tablet TAKE 1 TABLET(20 MG)  BY MOUTH DAILY 30 tablet 2     No current facility-administered medications for this visit.     Physical Exam:   BP 121/73  - Pulse 89  - Temp 36.2 ??C (97.2 ??F) (Temporal)  - Resp 18  - Wt 82.6 kg (182 lb)  - SpO2 95% Comment: patient is on 2 liters of room oxygen - BMI 27.27 kg/m??   Mildly fatigued appearing white male in no acute distress. Easy work of breathing without accessory muscle use. Coarse crackles and end inspiratory squeaks throughout lung fields.  Diminished breath founds over RLL. Speaking in full sentences.  Regular rate and rhythm with normal S1 and S2.  No murmurs, rubs, or gallops.  Clubbing unchanged from prior. 2+ pitting edema of bilateral lower extremities to just below the knee.  No edema over posterior thighs.    Diagnostic Review:   The following data were reviewed during this visit with key findings summarized below:  Pulmonary Function Testing:      Spirometry consistent with severe airway obstruction and suggestive of moderate restriction.  FVC has improved from prior but FEV1 is stable.  Normal inspiratory loop.    FVC (% predicted) FEV1 (% predicted) FEV1/FVC   11/10/2013 3.19 L (68%) 2.16 L (61%)  68%   02/23/2014 3.50 L (76%) 2.23 L (64%)  64%   07/12/2014  2.94 L (66%)  2.02 L (58%)  69%   01/15/2015  2.82 L (62%)  1.79 L (52%)  63%   02/08/2015  3.04 L (66%)  2.06 L (59%)  68%   04/02/2015  2.86 L (63%)  1.83 L (53%)  64%   12/10/2015  2.77 L (62%)  1.62 L (48%)  58%   01/27/2016  2.42 L (54%)  1.55 L (46%)  64%   03/24/2016  3.36 L (75%)  1.99 L (59%)  59%   07/21/2016  2.56 L (57%)  1.54 L (46%)  60%   12/29/2016  2.80 L (63%)  1.57 L (47%)  56%   04/06/2017  2.61 L (59%)  1.46 L (44%)  56%   07/06/2017  2.56 L (58%)  1.49 L (45%)  58%   10/12/2017  2.63 L (59%)  1.47 L (44%)  56%   01/18/2018  2.68 L (63%)  1.36 L (42%)  51%   05/17/2018 2.49 L (57%)  1.25 L (38%)  50%   09/26/2018 2.43 L (57%)  1.26 L (39%)  52%   01/26/2019 2.16 L (51%)  1.16 L (36%)  54%   06/06/2019 2.47 L (59%) 1.11 L (34%) 45%   11/10/19 2.14 L (50%) 1.20 L (37%) 56%   02/13/20 2.01 L (48%) 1.08 L (34%) 54%   05/02/20 2.29 L (54%) 1.11 L (34%) 48%   07/02/20 (pre) 2.11 L (51%) 1.17 L (37%) 56%   07/02/20 (post) 2.26 L (54%) [+7.2%] 1.22 L (38%) [+4.0%] 54%   10/31/20 2.46 L (59%) 1.17 L (37%) 48%   07/01/21 2.33 L (56%) 1.13 L (36%) 48%   09/29/21 1.92 L (47%) 1.01 L (32%) 53%   11/21/21 2.10 L (51%) 1.03 L (33%) 49%   04/14/22 2.09 L (54%) 1.05 L (35%) 50%     6-minute Walk (12/06/19): Heart Rate: Resting Heart Rate was 71 bpm. Maximum Heart Rate achieved 97 bpm. Patient did reach target heart rate (85-132 bpm).  Heart rate returned to 77 bpm after 2 minutes of recovery.  Oxygen Saturation: Resting O2 Saturation was 94% on RA.  O2 saturation nadir was 87% at minute 2. O2 saturation improved to 96% with 2L via [place] at minute 3 and maintained saturations above 88% for remainder of walk. O2 saturation returned to 96% on room air after 2 minutes of recovery.  Distance: Total 6 minute walk distance was 320 m. Predicted 6 minute walk distance 548.9 m (LLN of 395.8 m). Percent of predicted 6 minute walk distance of 58%. Testing included one 5-minute pause. Distance obtained after placed on 2L and walked for an additional 6 minutes.  BORG: Resting BORG score was 1. Maximum BORG score was 5 at minute 2. Concluding BORG score was 0 after 2 minutes of recovery. Impression: Significant exercise induced oxygen desaturation (below 88%). Exertional hypoxemia improved with 2L supplemental oxygen. Walk distance is less than expected suggesting a cardio-respiratory defect -- clinical correlation suggested.  Recommendation: Supplemental oxygen required with exercise at 2L [place]. Consider further evaluation with spirometry, lung volumes, respiratory muscle flow/force measurements, cardiopulmonary stress testing, and/or cardio/vascular studies.    6-Minute Walk (02/15/19): Resting Heart Rate was 91. Maximum Heart Rate achieved 119. Patient did reach target heart rate (86-133). Resting O2 Saturation was 98% on RA. Resting BORG score was 1. O2 saturation nadir was 91% at minute 6. Total 6 minute walk distance was 381 meters. Predicted 6 minute walk distance of 558 m (LLN of 424m). Percent of predicted 6 minute walk distance of 68%. Heart rate returned to 96 bpm after 2 minutes of recovery. O2 saturation returned to 96% on RA after 2 minutes of recovery. Maximum BORG score was 7 at minute 6. Concluding BORG score was 2 after 2 minutes of recovery. No evidence of resting or exercise induced hypoxemia. No supplemental oxygen is required at this time.    6-Minute Walk (01/26/19): Resting Heart Rate was 86. Maximum Heart Rate achieved 97. Patient did reach target heart rate (86-133). Resting O2 Saturation was 99% on RA. Resting BORG score was 1. O2 saturation nadir was 95% at minutes 3 and 6. Total 6 minute walk distance was 350 meters. Predicted 6 minute walk distance of 558 m (LLN of 462m). Percent of predicted 6 minute walk distance of 63%. Heart rate returned to 86 bpm after 2 minutes of recovery. O2 saturation returned to 96% on RA after 2 /minutes of recovery. Maximum BORG score was 6 at minutes 5 and 6. Concluding BORG score was 1 after 2 minutes of recovery. No evidence of resting or exercise induced hypoxemia. No supplemental oxygen is required at this time.    Cultures:    Source Bacterial Culture AFB Smear AFB Culture   04/02/15 Sputum 2+ OPF; 1+ Probably mPsA Negative Negative   07/02/15 Sputum 4+ OPF; 3+ mPsA; 3+ sPsA; 1+ Steno Negative Negative   01/23/16 Sputum 2+ OPF; 1+ mPsA; 1+ sPsA - -   03/24/16 Sputum 3+ OPF; 3+ mPsA; 3+ sPsA Negative Overgrown   07/21/16 Sputum 4+ OPF; 4+ mPsA; 2+ sPsA Negative Negative   12/29/16 Sputum 4+ OPF; 3+ mPsA; 3+ sPsA Negative Actinomadura sp   04/06/17 Sputum 3+ OPF; 4+ mPsA Negative Negative   07/06/17 Sputum 4+ OPF; 3+ mPsA Negative Negative   10/12/17 Sputum 4+ OPF; 3+ mPsA Negative Negative   01/18/18 Sputum OPF Negative Negative   05/17/18 Sputum 4+ OPF; 3+ mPsA - -   09/26/18 Sputum 4+ OPF; 3+ mPsA Negative Negative   01/26/19 Sputum 4+ OPF; 2+ mPsA Negative Negative   02/15/19 Sputum 3+ OPF; 3+ mPsA Negative Negative   06/06/19 Sputum  3+ OPF; 3+ sPsA; 3+ mPsA Negative Negative   11/10/19 Sputum 2+ OPF; 3+ mPsA Negative Negative   02/13/20 Sputum OPF Negative Negative   07/02/20 Sputum 3+ OPF; 1+sPsA; 3+ mPsA negative negative   10/31/20 Sputum 4+ OPF; 2+ sPsA; 4+ mPsA negative negative   11/17/20 Sputum 3+ OPF; 3+ mPsA negative negative   02/21/21 Sputum Few PsA negative negative   02/27/21 Sputum Scant C albicans and light A fumigatus - -   03/18/21 Sputum 2+ OPF; 1+ mPsA negative negative   03/26/21 Sputum 4+ OPF; 3+ mPsA negative negative   09/29/21 Sputum 4+ OPF; 4+ sPsA; 4+ mPsA negative negative      Aspergillus Ag BAL/serum (04/02/21): 0.55 (@ [facility])     Bronchiectasis Evaluation   IgG with subclasses:   Lab Results   Component Value Date/Time    IGGT 996 04/28/2012 06:21 AM    IGG1 402 04/28/2012 06:21 AM    IGG2 399 04/28/2012 06:21 AM    IGG3 148.0 (H) 04/28/2012 06:21 AM    IGG4 47.7 04/28/2012 06:21 AM     IgA (09/21/11): 283   IgM (09/21/11): 52   IgE:   Lab Results   Component Value Date/Time    IGE 16.6 02/13/2020 10:24 AM     Specific Titers: No results found for: TETANUSAB, TETGV, DIPHTERIAAB, DIPGV, S. pneumonia titers  HIV (05/02/20): Non-reactive  Autoimmune (05/02/20): ANA negative   ANCA: No results found for: ANCA, IFA, PR3ELISA, PR3QT, MPO, MPOQT  CBC-D (05/02/20): 6.1>14.8/44.3<156; ANC 4.1; ALC 1.2; AEC 0.17  Lab Results   Component Value Date/Time    WBC 4.1 11/20/2020 04:03 AM    WBC 6.1 11/19/2020 03:39 AM    WBC 7.7 11/18/2020 03:29 AM    WBC 7.9 08/13/2013 03:57 AM    WBC 9.4 08/12/2013 04:21 AM    WBC 11.7 (H) 08/11/2013 04:04 AM    HGB 11.8 (L) 11/20/2020 04:03 AM    HGB 14.8 03/24/2015 05:31 PM    HCT 34.5 (L) 11/20/2020 04:03 AM    HCT 30.8 (L) 08/13/2013 03:57 AM    PLT 134 (L) 11/20/2020 04:03 AM    PLT 292 08/13/2013 03:57 AM    NEUTROABS 7.8 11/17/2020 02:31 PM    NEUTROABS 3.7 02/13/2020 10:24 AM    NEUTROABS 3.2 04/19/2017 05:45 AM    NEUTROABS 4.9 06/21/2013 09:31 PM    NEUTROABS 4.6 06/21/2013 08:13 PM    NEUTROABS 4.1 04/26/2012 11:54 PM    LYMPHSABS 1.4 11/17/2020 02:31 PM    LYMPHSABS 1.4 02/13/2020 10:24 AM    LYMPHSABS 1.4 (L) 04/19/2017 05:45 AM    LYMPHSABS 1.9 06/21/2013 09:31 PM    LYMPHSABS 1.8 06/21/2013 08:13 PM    LYMPHSABS 2.8 04/26/2012 11:54 PM    EOSABS 0.2 11/17/2020 02:31 PM    EOSABS 0.2 02/13/2020 10:24 AM    EOSABS 0.2 04/19/2017 05:45 AM    EOSABS 0.2 06/21/2013 09:31 PM    EOSABS 0.2 06/21/2013 08:13 PM    EOSABS 0.3 04/26/2012 11:54 PM    BASOSABS 0.0 11/17/2020 02:31 PM    BASOSABS 0.0 06/21/2013 09:31 PM     Alpha-1 level: No results found for: A1TRYP  Alpha-1 genotype (11/11/10): MM  CF Sweat test:   Lab Results   Component Value Date/Time    AMTLFT 195 06/17/2012 11:39 AM    AMTRT 254 06/17/2012 11:39 AM    SWCLL 6 06/17/2012 11:39 AM    SWCLR 7 06/17/2012 11:39 AM  PCD testing: nNO (07/28/13): bilateral mean of 239.95 nl/min, which is Normal.      IgE (05/23/18):               Imaging  Chest CT (11/14/21): Images personally reviewed. I agree with radiology interpretation that he continues to have extensive cavitary consolidation in right apex with areas of dependent soft tissue within medial component of cavity; findings unchanged and c/w mycetoma. Ill-defined nodular consolidation in right posterior lung. S/p resection of right lower lobe. Unchanged extensive bronchiectasis. Mosaic attenuation c/w gas trapping 2/2 small airways disease.     CTA Chest (04/15/21): Images personally reviewed. Sequelae of right middle lobectomy and right lower lobe wedge resection. Chronic consolidation in the medial posterior right upper lobe and right lung apex with volume loss and architectural distortion of the right upper lobe. Compensatory hyperexpansion of the left lung with mosaic attenuation consistent with areas of air trapping. Extensive chronic bilateral bronchiectasis and bronchial thickening. Unchanged diffuse bilateral tree-in-bud nodularity.  Mildly enlarged and tortuous right bronchial artery, predominantly supplying the right upper lung, arising from the lateral descending aorta at the level of T6 (7:58, 4:38). No enlarged left-sided bronchial arteries. No CT evidence of intrapulmonary arteriovenous malformation.     Chest CT (02/25/21): Images personally reviewed.  Stable sequelae of right middle lobectomy and right lower lobe wedge resection. 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 Stable bilateral diffuse extensive bronchiectasis, bronchial wall thickening, and sequelae of chronic endobronchial infection.        CXR (05/02/20): Images not available for review. Per report, Visualized cardiac and mediastinal contours within normal limits allowing for mediastinal shift to the right. Circumferential right pleural fluid/thickening. Volume loss in the right lung consistent with known right lung resection. Irregular opacities throughout both lungs, right greater than left, consistent with known bronchiectasis.     Chest CT (11/10/19): Images personally reviewed. Stable sequelae of right middle lobectomy and right lower lobe wedge resection. Stable consolidation in the medial posterior right upper lobe and right lung apex, bilateral diffuse extensive bronchiectasis and bronchial wall thickening and unchanged sequelae of chronic endobronchial infection.    Chest CT (02/15/19): Images personally reviewed. Stable sequelae of right middle lobectomy and right lower lobe wedge resection.  Unchanged diffuse extensive bronchiectasis and bronchial wall thickening bilaterally.  Diffusely scattered tree-in-bud nodules in bilateral lung parenchyma with interval increase in tree-in-bud nodules in left lung; the constellation of these findings are consistent with sequelae of worsening chronic endobronchial infection.    Chest CT (03/31/16): Images personally reviewed. Status post right middle and right lower lobectomies. Tiny loculated basilar right hydropneumothorax, probably chronic. Moderate cylindrical and varicoid bronchiectasis throughout both lungs, with associated scattered mucoid impaction, tree-in-bud opacities and scattered peribronchovascular and subpleural consolidation in the right upper lobe. 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 Two-vessel coronary atherosclerosis. Nonobstructing right nephrolithiasis.    Modified Barium Swallow (01/17/19): Shallow laryngeal penetration observed with thin liquids and puree.  No aspiration identified with any of the tested consistencies.    Immunization History   Administered Date(s) Administered    COVID-19 VAC,BIVALENT(32YR UP),PFIZER 12/17/2020    COVID-19 VAC,MRNA,TRIS(12Y UP)(PFIZER)(GRAY CAP) 12/01/2019, 05/03/2020, 01/07/2022    COVID-19 VACC,MRNA,(PFIZER)(PF) 04/20/2019, 05/13/2019, 05/18/2019, 12/01/2019    HEPATITIS B VACCINE ADULT,IM(ENERGIX  B, RECOMBIVAX) 05/13/2020, 06/11/2020, 11/13/2020    Hepatitis A (Adult) 05/13/2020, 11/13/2020    INFLUENZA QUAD ADJUVANTED 27YR UP(FLUAD) 11/25/2021    INFLUENZA TIV (TRI) 51MO+ W/ PRESERV (IM) 11/13/2010, 01/07/2012, 12/09/2017, 11/30/2019, 12/17/2020    INFLUENZA TIV (TRI) PF (IM) 02/20/2012, 12/09/2012    Influenza Vaccine Quad(IM)6 MO-Adult(PF) 01/31/2016, 12/09/2017    Influenza Virus Vaccine, unspecified formulation 12/25/2014, 12/01/2016    Influenza Whole 12/21/2008    PNEUMOCOCCAL POLYSACCHARIDE 23-VALENT 10/22/2010, 02/07/2016, 10/07/2019    Pneumococcal Conjugate 13-Valent 04/28/2013    RSV VACCINE,ADJUVANTED(PF)(AREXVY) 01/07/2022    TdaP 03/22/2012, 07/23/2021    ZOSTAVAX - ZOSTER VACCINE, LIVE, SQ 07/03/2014, 06/11/2017, 01/19/2018     Portions of this record have been created using Dragon dictation software. Dictation errors have been sought, but may not have been identified and corrected.

## 2022-04-14 NOTE — Unmapped (Addendum)
Working on home IV antibiotics for you since orals didn't work.    Gershon Cull calling about the Life2000.  Call your oxygen company about bigger tanks.    Symptoms of a bronchiectasis exacerbation: 48 hours of at least two of the following:  Increased cough  Change in volume or appearance of sputum  Increased sputum purulence  Worsening shortness of breath and/or exercise tolerance  Fatigue and/or malaise  Coughing up blood (hemoptysis)    If you are experiencing some of these symptoms:  Increase the frequency and/or intensity of your airway clearance  Try to submit a sputum sample for bacterial and AFB cultures (at [facility] or locally)  Reach out to me or your local physician.  If you need antibiotics, recommend treating for 14 days.      Please bring any new airway clearance equipment to your next visit to ensure that you are using and caring for it properly.    Thank you for allowing me to be a part of your care. Please call the clinic with any questions.    Viona Gilmore, MD, MPH  Pulmonary and Critical Care Medicine  [address]  [id number]  [place], [place] [address]    Thank you for your visit to the [facility]. You may receive a survey from [facility] regarding your visit today, and we are eager to use this feedback to improve your experience. Thank you for taking the time to fill it out.    Between appointments, you can reach [place] at these numbers:  For appointments: [phone]  For my nurse, Alvino Blood: [phone]  Fax: [phone]  For urgent issues after hours: Hospital Operator @ [phone] & ask for Pulmonary Fellow on call    My [facility] Chart is for non-urgent messages. This means you have a simple medical question that does not require an immediate response.     If you need immediate attention, call 911.     Responses may take up to 3 business days. Your message will be read by your provider or another medical team member who may respond on your provider???s behalf.    Some questions cannot be answered through messages in My [facility] Chart. Depending on your question, your provider???s office may ask you to schedule an appointment.     Information sent through My [facility] Chart will become part of your medical record.    For further information, check out the websites below:    [facility] Bronchiectasis/NTM Care and Research Center: [email or web address]    Impact Airway Clearance Education: http://www.impact-be.com    Be Clear with Bronchiectasis by Toniann Ket, MPH (health educator and patient): https://www.letsbecleartoday.[email or web address]/    Videos from the patient session of the World Bronchiectasis Conference in [place] on October 03, 2016 can be watched here (TrustyNews.es).     Bronchiectasis Toolbox (information about airway clearance): DiningCalendar.de    Copy for Individuals with Bronchiectasis and/or NTM through the Bronchiectasis Registry and the COPD Foundation: https://www.mckee-young.org/    Exercise program by [facility] [place]: http://www.brown.com/    Information about Non-tuberculous Mycobacterial Infections:  https://www.ntminfo.org     Interested in clinical trials and other research opportunities?  www.clinicaltrials.gov    The Rare Diseases Clinical Research Network [facility]) Genetic Disorders of Mucociliary Clearance Consortium [facility]) Contact Registry is a way for patients with disorders of mucociliary clearance (such as bronchiectasis) and their family members to learn about research studies they may be able to join. Participation is completely voluntary and you  may choose to withdraw at any time. There is no cost to join the [employer]. For more information or to join the registry please go to the following website:  [email or web address]

## 2022-04-15 DIAGNOSIS — J479 Bronchiectasis, uncomplicated: Principal | ICD-10-CM

## 2022-04-15 NOTE — Unmapped (Signed)
Left message for [employer] regarding the Life 2000.  Patient did not get his Life2000 from them but from a DME company.    Contacted David at [employer] and he remembers the patient will reach out to patient's local store and get back to me.

## 2022-04-17 DIAGNOSIS — G4733 Obstructive sleep apnea (adult) (pediatric): Secondary | ICD-10-CM | POA: Diagnosis not present

## 2022-04-17 DIAGNOSIS — J479 Bronchiectasis, uncomplicated: Secondary | ICD-10-CM | POA: Diagnosis not present

## 2022-04-17 DIAGNOSIS — J449 Chronic obstructive pulmonary disease, unspecified: Secondary | ICD-10-CM | POA: Diagnosis not present

## 2022-04-18 MED ORDER — AMOXICILLIN 875 MG-POTASSIUM CLAVULANATE 125 MG TABLET
ORAL_TABLET | Freq: Two times a day (BID) | ORAL | 0 refills | 14 days | Status: CP
Start: 2022-04-18 — End: 2022-05-02

## 2022-04-18 NOTE — Unmapped (Signed)
Addended by: Viona Gilmore on: 04/18/2022 01:45 PM     Modules accepted: Orders

## 2022-04-19 NOTE — Unmapped (Signed)
Attending Physician Attestation:      I confirm that I interviewed and examined the patient, reviewed all the relevant lab and diagnostic data, and we agreed upon a plan of treatment. I agree with the findings, assessment, and plan as documented by Dr. Maryruth Hancock.    Vella Raring, MD

## 2022-04-20 DIAGNOSIS — J479 Bronchiectasis, uncomplicated: Principal | ICD-10-CM

## 2022-04-24 DIAGNOSIS — G4733 Obstructive sleep apnea (adult) (pediatric): Secondary | ICD-10-CM | POA: Diagnosis not present

## 2022-04-24 DIAGNOSIS — J479 Bronchiectasis, uncomplicated: Secondary | ICD-10-CM | POA: Diagnosis not present

## 2022-04-24 DIAGNOSIS — J471 Bronchiectasis with (acute) exacerbation: Secondary | ICD-10-CM | POA: Diagnosis not present

## 2022-04-29 NOTE — Unmapped (Signed)
Left voice mail asking patient for update regarding how he's feeling nearing end of Augmentin course. Requested pt either call back or send mychart message with update.

## 2022-05-04 DIAGNOSIS — R002 Palpitations: Secondary | ICD-10-CM | POA: Diagnosis not present

## 2022-05-07 DIAGNOSIS — R002 Palpitations: Secondary | ICD-10-CM | POA: Diagnosis not present

## 2022-05-12 DIAGNOSIS — J069 Acute upper respiratory infection, unspecified: Secondary | ICD-10-CM | POA: Diagnosis not present

## 2022-05-12 DIAGNOSIS — J449 Chronic obstructive pulmonary disease, unspecified: Secondary | ICD-10-CM | POA: Diagnosis not present

## 2022-05-18 DIAGNOSIS — J479 Bronchiectasis, uncomplicated: Principal | ICD-10-CM

## 2022-05-18 DIAGNOSIS — G4733 Obstructive sleep apnea (adult) (pediatric): Secondary | ICD-10-CM | POA: Diagnosis not present

## 2022-05-18 DIAGNOSIS — J449 Chronic obstructive pulmonary disease, unspecified: Secondary | ICD-10-CM | POA: Diagnosis not present

## 2022-05-19 ENCOUNTER — Ambulatory Visit: Admit: 2022-05-19 | Discharge: 2022-05-20 | Payer: PRIVATE HEALTH INSURANCE

## 2022-05-19 DIAGNOSIS — M810 Age-related osteoporosis without current pathological fracture: Principal | ICD-10-CM

## 2022-05-19 DIAGNOSIS — J479 Bronchiectasis, uncomplicated: Principal | ICD-10-CM

## 2022-05-19 MED ORDER — SODIUM CHLORIDE 0.9 % (FLUSH) INJECTION SYRINGE
11 refills | 0 days | Status: CP
Start: 2022-05-19 — End: ?
  Filled 2022-05-22: qty 180, 30d supply, fill #0

## 2022-05-19 MED ORDER — SODIUM CHLORIDE 10 % FOR NEBULIZATION
Freq: Two times a day (BID) | RESPIRATORY_TRACT | 3 refills | 90 days | Status: CP
Start: 2022-05-19 — End: ?
  Filled 2022-05-22: qty 900, 30d supply, fill #0

## 2022-05-19 NOTE — Unmapped (Signed)
[facility] Refill Coordination Note    Specialty Medication(s) to be Shipped:   CF/Pulmonary/Asthma: -Nebulized colistin (150mg  vials) and supply kit    Other medication(s) to be shipped:  HTS 10%, neb cups, alcohol pads, syringes and needles and saline flushes     Jordan [name], DOB: [birth year]  Phone: [phone] (home)       All above HIPAA information was verified with patient.     Was a Nurse, learning disability used for this call? No    Completed refill call assessment today to schedule patient's medication shipment from the [facility] [phone]).  All relevant notes have been reviewed.     Specialty medication(s) and dose(s) confirmed: Regimen is correct and unchanged.   Changes to medications: Kyndell reports no changes at this time.  Changes to insurance: No  New side effects reported not previously addressed with a pharmacist or physician: None reported  Questions for the pharmacist: No    Confirmed patient received a Conservation officer, historic buildings and a Surveyor, mining with first shipment. The patient will receive a drug information handout for each medication shipped and additional FDA Medication Guides as required.       DISEASE/MEDICATION-SPECIFIC INFORMATION        N/A    SPECIALTY MEDICATION ADHERENCE     Medication Adherence    Patient reported X missed doses in the last month: 0  Specialty Medication: colistimethate 150 mg injection (COLYMYCIN)  Patient is on additional specialty medications: Yes  Additional Specialty Medications: tobramycin (PF) 300 mg/5 mL nebulizer solution ([name])  Patient Reported Additional Medication X Missed Doses in the Last Month: 0  Patient is on more than two specialty medications: No  Any gaps in refill history greater than 2 weeks in the last 3 months: no  Demonstrates understanding of importance of adherence: yes  Informant: patient  Support network for adherence: family member            Were doses missed due to medication being on hold? No    Tobramycin 300  mg/64mL : 2 days of medicine on hand   Colistin 150 mg: 0 days of medicine on hand (starts next cycle this Friday (3/1)    REFERRAL TO PHARMACIST     Referral to the pharmacist: Not needed      [facility]     Shipping address confirmed in Epic.     Patient was notified of new phone menu : No    Delivery Scheduled: Yes, Expected medication delivery date: 05/22/22.     Medication will be delivered via UPS to the prescription address in Epic WAM.    Jordan [name], PharmD   [facility] Specialty Pharmacist

## 2022-05-19 NOTE — Unmapped (Signed)
Assessment/Plan:   The patient has osteoporosis at the hip. No fracture history. The patient has the following risk factors for osteoporosis: chronic glucocorticoid use. Differential diagnosis includes primary vs secondary causes of osteoporosis. Prior lab evaluation showed normal PTH, TSH, creatinine, Lfts, vitamin D levels.     We discussed the importance of adequate calcium intake (~1200 mg daily) and he currently meets this requirement. Discussed weight bearing exercise for bone health. Vit D level recently normal.     Medical management was discussed and includes continuing alendronate vs switching to reclast. He has been on alendronate now for 1.5 years. His DEXA was stable compared to 1 year prior. Thus we preferred to switch to reclast and his pulmonologist confirmed this was fine. He received this on 02/26/2022 and tolerated treatment well. Will continue current regimen and repeat DEXA in 01/2024.     All questions were answered and patient agrees with plan.     Return in about 9 months (around 02/15/2023).      Thompson Grayer, MD  [facility] at [facility]  Phone: [phone]   Fax: [phone]    This note was completed with the assistance of voice recognition software.  Please excuse grammatical errors.          Subjective:     History of Present Illness:  Jordan [name] is a 67 year old male with history of HTN, bronchiectasis, pulmonary aspergillosis, GERD, memory loss, dizziness, hemoptysis seen at the request of Dr. Garner Nash in consultation for evaluation of osteoporosis. History was obtained from the patient and review of the electronic medical record.     Diagnosis: 07/2020. Was on steroids for many years for his lung conditions. Now off oral steroids. He is on symbicort. Has lost 3-4 inches in height. Was previously followed by endocrinology locally in [facility] and wants to have his doctors under one roof. Has been on alendronate 70 mg weekly but had trouble swallowing pills. DEXA was stable on treatment. At his last visit, we switched to reclast and he received his first dose 01/29/2022.     Today, he reports feeling well. Tolerated reclast well. Denies jaw, hip pain. Denies interim falls, fracture, kidney stone.     DEXA history: not available to review    Fragility fractures: no  Other fractures history: no    Family history of osteoporosis or hip fracture: no    History of:  Thyroid disease: no  Elevated calcium levels: no  Multiple Myeloma: no  Cancer: no  Heartburn or reflux: yes but well controlled on omeprazole 20 mg daily  Kidney stones: yes, many years ago.   CVD: no. Has dilated aorta and on cardizem.   Radiation treatment: no    Dental work coming up? No, sees dentist every 4 months.     Exercise: on oxygen.     Diet: yogurt daily.   ETOH: no  Tobacco: no    Supplements:   Calcium: citracal 650 mg twice a day.   Vitamin D: no  MVI: one daily    The patient is taking the following:  Proton pump inhibitor: YES  Aromatase inhibitor: no  Corticosteroid: none now except symbicort  Heparin: no    I have reviewed the patient's medical history, medications, allergies, family and social history in detail and updated the computerized patient record.      Social History     Social History Narrative    Was working part-time as an Art gallery manager with [employer]  due to his medical condition but recently obtain disability.       Review of Systems reviewed and otherwise negative except as noted in HPI.    Objective:     Physical Exam:  BP 145/80  - Pulse 92  - Ht 174 cm (5' 8.5)  - Wt 83 kg (183 lb)  - BMI 27.42 kg/m??   General appearance - alert, well appearing, and in no distress, wears nasal cannula.     Lab Review:    Lab Results   Component Value Date    CALCIUM 8.9 12/22/2021    CALCIUM 9.1 11/21/2021    CALCIUM 9.2 11/14/2021    PHOS 3.7 01/22/2016    PHOS 4.1 06/23/2013    PHOS 4.0 06/21/2013    CREATININE 0.93 12/22/2021    CREATININE 1.10 11/21/2021    CREATININE 1.17 (H) 11/14/2021    VITDTOTAL 50.0 01/13/2022    ALBUMIN 3.7 10/14/2021    ALBUMIN 4.0 10/14/2021    ALBUMIN 4.2 11/17/2020       TSH (uIU/mL)   Date Value   10/14/2021 1.334     Free T4 (ng/dL)   Date Value   91/47/8295 1.19

## 2022-05-22 MED FILL — EMPTY CONTAINER: 90 days supply | Qty: 1 | Fill #1

## 2022-05-22 MED FILL — ALCOHOL PREP PADS: 50 days supply | Qty: 100 | Fill #2

## 2022-05-22 MED FILL — LC PLUS MISC: 30 days supply | Qty: 2 | Fill #2

## 2022-05-22 MED FILL — BD REGULAR BEVEL NEEDLES 21 GAUGE X 1 1/2": 30 days supply | Qty: 60 | Fill #1

## 2022-05-22 MED FILL — BD LUER-LOK SYRINGE 3 ML 21 GAUGE X 1": 30 days supply | Qty: 60 | Fill #4

## 2022-05-22 MED FILL — COLISTIN (COLISTIMETHATE SODIUM) 150 MG SOLUTION FOR INJECTION: 30 days supply | Qty: 60 | Fill #5

## 2022-05-23 DIAGNOSIS — G4733 Obstructive sleep apnea (adult) (pediatric): Secondary | ICD-10-CM | POA: Diagnosis not present

## 2022-05-23 DIAGNOSIS — J479 Bronchiectasis, uncomplicated: Secondary | ICD-10-CM | POA: Diagnosis not present

## 2022-05-23 DIAGNOSIS — J471 Bronchiectasis with (acute) exacerbation: Secondary | ICD-10-CM | POA: Diagnosis not present

## 2022-05-27 DIAGNOSIS — B441 Other pulmonary aspergillosis: Principal | ICD-10-CM

## 2022-05-27 DIAGNOSIS — J479 Bronchiectasis, uncomplicated: Principal | ICD-10-CM

## 2022-06-05 NOTE — Unmapped (Signed)
Faxed signed requested prescription and notes to [facility]

## 2022-06-09 DIAGNOSIS — J3089 Other allergic rhinitis: Principal | ICD-10-CM

## 2022-06-09 DIAGNOSIS — J479 Bronchiectasis, uncomplicated: Principal | ICD-10-CM

## 2022-06-09 MED ORDER — ALBUTEROL SULFATE 2.5 MG/3 ML (0.083 %) SOLUTION FOR NEBULIZATION
Freq: Two times a day (BID) | RESPIRATORY_TRACT | 3 refills | 88 days | Status: CP
Start: 2022-06-09 — End: 2023-06-09

## 2022-06-10 DIAGNOSIS — J479 Bronchiectasis, uncomplicated: Principal | ICD-10-CM

## 2022-06-10 DIAGNOSIS — A498 Other bacterial infections of unspecified site: Principal | ICD-10-CM

## 2022-06-10 MED ORDER — COLISTIN (COLISTIMETHATE SODIUM) 150 MG SOLUTION FOR INJECTION
5 refills | 0 days | Status: CP
Start: 2022-06-10 — End: ?
  Filled 2022-07-22: qty 60, 30d supply, fill #0

## 2022-06-11 NOTE — Unmapped (Signed)
[facility] let me know they have spoken with the patient and have arranged to bring him the larger tanks tomorrow.

## 2022-06-12 NOTE — Unmapped (Signed)
Jordan [name] declined the next shipment of Colistimethate due to only taking this medication on odd month's, he currently taking Tobramycin (even month's).

## 2022-06-15 ENCOUNTER — Ambulatory Visit: Admit: 2022-06-15 | Discharge: 2022-06-16 | Payer: PRIVATE HEALTH INSURANCE

## 2022-06-15 DIAGNOSIS — J479 Bronchiectasis, uncomplicated: Secondary | ICD-10-CM | POA: Diagnosis not present

## 2022-06-15 DIAGNOSIS — J984 Other disorders of lung: Secondary | ICD-10-CM | POA: Diagnosis not present

## 2022-06-15 DIAGNOSIS — I2510 Atherosclerotic heart disease of native coronary artery without angina pectoris: Secondary | ICD-10-CM | POA: Diagnosis not present

## 2022-06-15 DIAGNOSIS — B441 Other pulmonary aspergillosis: Secondary | ICD-10-CM | POA: Diagnosis not present

## 2022-06-15 MED FILL — TOBRAMYCIN 300 MG/5 ML IN 0.225 % SODIUM CHLORIDE FOR NEBULIZATION: RESPIRATORY_TRACT | 28 days supply | Qty: 280 | Fill #3

## 2022-06-16 DIAGNOSIS — J479 Bronchiectasis, uncomplicated: Secondary | ICD-10-CM | POA: Diagnosis not present

## 2022-06-16 DIAGNOSIS — J449 Chronic obstructive pulmonary disease, unspecified: Secondary | ICD-10-CM | POA: Diagnosis not present

## 2022-06-16 DIAGNOSIS — G4733 Obstructive sleep apnea (adult) (pediatric): Secondary | ICD-10-CM | POA: Diagnosis not present

## 2022-06-23 DIAGNOSIS — J479 Bronchiectasis, uncomplicated: Secondary | ICD-10-CM | POA: Diagnosis not present

## 2022-06-23 DIAGNOSIS — E440 Moderate protein-calorie malnutrition: Secondary | ICD-10-CM | POA: Diagnosis not present

## 2022-06-23 DIAGNOSIS — J9611 Chronic respiratory failure with hypoxia: Secondary | ICD-10-CM | POA: Diagnosis not present

## 2022-06-23 DIAGNOSIS — C61 Malignant neoplasm of prostate: Secondary | ICD-10-CM | POA: Diagnosis not present

## 2022-06-23 DIAGNOSIS — J471 Bronchiectasis with (acute) exacerbation: Secondary | ICD-10-CM | POA: Diagnosis not present

## 2022-06-23 DIAGNOSIS — G4733 Obstructive sleep apnea (adult) (pediatric): Secondary | ICD-10-CM | POA: Diagnosis not present

## 2022-06-23 DIAGNOSIS — I700 Atherosclerosis of aorta: Secondary | ICD-10-CM | POA: Diagnosis not present

## 2022-06-23 DIAGNOSIS — G894 Chronic pain syndrome: Secondary | ICD-10-CM | POA: Diagnosis not present

## 2022-07-07 NOTE — Unmapped (Signed)
[facility] Clinical Assessment & Refill Coordination Note    Jordan [name], DOB: 1956-03-06  Phone: [phone] (home)     All above HIPAA information was verified with patient.     Was a Nurse, learning disability used for this call? No    Specialty Medication(s):   CF/Pulmonary/Asthma: -Generic inhaled tobramycin 300mg /78mL inhalation solution  -Nebulized colistin (150mg  vials) and supply kit     Current Outpatient Medications   Medication Sig Dispense Refill    albuterol 2.5 mg /3 mL (0.083 %) nebulizer solution Inhale 3 mL (2.5 mg total) by nebulization two (2) times a day. 525 mL 3    albuterol HFA 90 mcg/actuation inhaler Inhale 2 puffs Four (4) times a day. (Patient taking differently: Inhale 2 puffs four (4) times a day as needed.) 25.5 g 3    alcohol swabs (ALCOHOL PREP PADS) PadM Use as directed with inhaled antibiotics 100 each 5    ascorbic acid, vitamin C, (VITAMIN C) 1000 MG tablet Take 1 tablet (1,000 mg total) by mouth daily.      atorvastatin (LIPITOR) 10 MG tablet Take 1 tablet (10 mg total) by mouth daily.      azithromycin (ZITHROMAX) 250 MG tablet TAKE 1 TABLET BY MOUTH EVERY DAY 90 tablet 3    budesonide-formoterol (SYMBICORT) 80-4.5 mcg/actuation inhaler INHALE 2 PUFFS TWO TIMES A DAY. 30.6 g 3    calcium carbonate (CALCIUM 600 ORAL) Take 2 tablets by mouth two (2) times a day. 1 in morning and 1 in the evening      ciprofloxacin HCl (CIPRO) 750 MG tablet Take 1 tablet (750 mg total) by mouth two (2) times a day. 28 tablet 0    coenzyme Q10 300 mg cap Take 10-300 mg by mouth daily.      colistimethate (COLYMYCIN) 150 mg injection Inject 2 mL sterile water for injection to mix colistin vial, then draw up 2 mL (150mg ) and inhale 2 times a day, 28 days on and 28 days off. 60 each 5    dilTIAZem (CARDIZEM CD) 120 MG 24 hr capsule Take 1 capsule (120 mg total) by mouth daily.      dornase alfa (PULMOZYME) 1 mg/mL nebulizer solution Inhale 1 ampule (2.5 mg) daily. Use at least 30-60 minutes before airway clearance, or after airway clearance 225 mL 3    empty container (SHARPS CONTAINER) Misc USE AS DIRECTED 1 each 0    empty container Misc USE AS DIRECTED 1 each 2    empty container Misc Use as directed to dispose of needles. When full, make sure lid is closed tightly then dispose of container in trash. 1 each 2    ferrous sulfate 325 (65 FE) MG tablet Take 1 tablet (325 mg total) by mouth Two (2) times a day. BID      fluticasone propionate (FLONASE) 50 mcg/actuation nasal spray 1 spray into each nostril daily.      HYDROcodone-acetaminophen (NORCO) 7.5-325 mg per tablet Take 1 tablet by mouth three (3) times a day (at 6am, noon and 6pm).      L. acidophilus/Bifid. animalis 32 billion cell cap Take 2 capsules by mouth daily.      montelukast (SINGULAIR) 10 mg tablet TAKE 1 TABLET BY MOUTH EVERY DAY AT NIGHT 90 tablet 3    multivitamin-minerals-lutein Tab Take 1 tablet by mouth daily.      nebulizers (LC PLUS) Misc Use with inhaled medications 1 each 5    nebulizers Misc use with  nebulized medications 12 each 0    needle, disp, 21 G (BD REGULAR BEVEL NEEDLES) 21 gauge x 1 1/2 Ndle Use as directed with inhaled Colistin 100 each 3    omeprazole (PRILOSEC) 20 MG capsule Take 1 capsule (20 mg total) by mouth Two (2) times a day.      OXYGEN-AIR DELIVERY SYSTEMS MISC Inhale 2 mL.      sodium chloride (BD POSIFLUSH NORMAL SALINE 0.9) 0.9 % injection Inject 2mL of 0.9%NaCl into colistin vial & gently mix. After withdrawing colistin dose, add an additional 1mL of 0.9%NaCl to neb cup with the colistin dose. 180 mL 11    sodium chloride 10 % Nebu Measure and Inhale 5 mL by nebulization Two (2) times a day. Discard remaining amount and use a new vial for each dose. 900 mL 3    sterile water Soln Use 2mL to mix Colistin, then add additional 2mL to neb cup with 2mL of mixed colistin. Inhale twice daily 28 days on and 28 days off. 600 mL 5    syringe with needle (BD LUER-LOK SYRINGE) 3 mL 20 gauge x 1 1/2 Syrg For use with inhaled antibiotic (Colistin) 60 each 30    syringe with needle (BD LUER-LOK SYRINGE) 3 mL 21 gauge x 1 Syrg For use with inhaled antibiotic (Colistin) 60 each 5    tiotropium bromide (SPIRIVA RESPIMAT) 2.5 mcg/actuation inhalation mist Inhale 2 puffs daily. 12 g 3    tobramycin, PF, ([name]) 300 mg/5 mL nebulizer solution Inhale the contents of 1 ampule (300 mg total) by nebulization every twelve (12) hours. 28 days on and 28 days off. 280 mL 5    zinc gluconate 50 mg (7 mg elemental zinc) tablet Take 1 tablet (50 mg total) by mouth.      zoledronic acid-mannitol&water (RECLAST) 5 mg/100 mL PgBk        No current facility-administered medications for this visit.        Changes to medications: Jordan [name] reports no changes at this time.    Allergies   Allergen Reactions    Gentamicin Other (See Comments)     BALANCE ISSUES    Tobramycin Other (See Comments)     ototoxicity    Mirtazapine Other (See Comments)     Lower extremity swelling    Morphine Nausea And Vomiting       Changes to allergies: No    SPECIALTY MEDICATION ADHERENCE     Tobramycin 300  mg/32mL : 12 days of medicine on hand   Colistin 150 mg: 0 days of medicine on hand (will start next cycle 07/22/22)    Medication Adherence    Patient reported X missed doses in the last month: 0  Specialty Medication: Colistin 150mg  BID - alternates every other (odd) month  Patient is on additional specialty medications: Yes  Additional Specialty Medications: Tobramycin 300 mg/56mL Q12 - alternates every other (even) month  Patient Reported Additional Medication X Missed Doses in the Last Month: 0  Patient is on more than two specialty medications: No  Any gaps in refill history greater than 2 weeks in the last 3 months: no  Demonstrates understanding of importance of adherence: yes  Informant: patient  Support network for adherence: family member          Specialty medication(s) dose(s) confirmed: Regimen is correct and unchanged.     Are there any concerns with adherence? No    Adherence counseling provided? Not needed    CLINICAL MANAGEMENT AND  INTERVENTION      Clinical Benefit Assessment:    Do you feel the medicine is effective or helping your condition? Yes    Clinical Benefit counseling provided? Progress note from 1/23 shows evidence of clinical benefit    Adverse Effects Assessment:    Are you experiencing any side effects? No    Are you experiencing difficulty administering your medicine? No    Quality of Life Assessment:    Quality of Life    Rheumatology  Oncology  Dermatology  Cystic Fibrosis          How many days over the past month did your pseudomonas lung infection  keep you from your normal activities? For example, brushing your teeth or getting up in the morning. Patient declined to answer    Have you discussed this with your provider? Not needed    Acute Infection Status:    Acute infections noted within Epic:  MDR Pseudomonas  Patient reported infection: None    Therapy Appropriateness:    Is therapy appropriate and patient progressing towards therapeutic goals? Yes, therapy is appropriate and should be continued    DISEASE/MEDICATION-SPECIFIC INFORMATION      N/A    Other Infectious Disease: Not Applicable    PATIENT SPECIFIC NEEDS     Does the patient have any physical, cognitive, or cultural barriers? No    Is the patient high risk? No    Did the patient require a clinical intervention? No    Does the patient require physician intervention or other additional services (i.e., nutrition, smoking cessation, social work)? No    SOCIAL DETERMINANTS OF HEALTH     At the [facility], we have learned that life circumstances - like trouble affording food, housing, utilities, or transportation can affect the health of many of our patients.   That is why we wanted to ask: are you currently experiencing any life circumstances that are negatively impacting your health and/or quality of life? Patient declined to answer    Social Determinants of Health     Financial Resource Strain: Low Risk  (01/22/2021)    Received from [facility]    Overall Financial Resource Strain (CARDIA)     Difficulty of Paying Living Expenses: Not hard at all   Internet Connectivity: Not on file   Food Insecurity: No Food Insecurity (01/22/2021)    Received from [facility]    Hunger Vital Sign     Worried About Running Out of Food in the Last Year: Never true     Ran Out of Food in the Last Year: Never true   Tobacco Use: Low Risk  (05/19/2022)    Patient History     Smoking Tobacco Use: Never     Smokeless Tobacco Use: Never     Passive Exposure: Not on file   Housing/Utilities: Low Risk  (11/18/2020)    Housing/Utilities     Within the past 12 months, have you ever stayed: outside, in a car, in a tent, in an overnight shelter, or temporarily in someone else's home (i.e. couch-surfing)?: No     Are you worried about losing your housing?: No     Within the past 12 months, have you been unable to get utilities (heat, electricity) when it was really needed?: No   Alcohol Use: Not At Risk (01/22/2021)    Received from [facility]    AUDIT-C     Frequency of Alcohol Consumption: Never     Average Number of  Drinks: Patient does not drink     Frequency of Binge Drinking: Never   Transportation Needs: No Transportation Needs (01/22/2021)    Received from [facility] - Transportation     Lack of Transportation (Medical): No     Lack of Transportation (Non-Medical): No   Substance Use: Not on file   Health Literacy: Not on file   Physical Activity: Inactive (01/22/2021)    Received from [facility]    Exercise Vital Sign     Days of Exercise per Week: 0 days     Minutes of Exercise per Session: 0 min   Interpersonal Safety: Not on file   Stress: No Stress Concern Present (01/22/2021)    Received from [facility] - Occupational Stress Questionnaire     Feeling of Stress : Not at all   Intimate Partner Violence: Unknown (06/24/2021) Received from [facility]    HITS     Physically Hurt: Not on file     Insult or Talk Down To: Not on file     Threaten Physical Harm: Not on file     Scream or Curse: Not on file   Depression: Not at risk (06/26/2020)    Received from [facility]    Depression     Depression Screening: 0   Social Connections: Unknown (08/01/2021)    Received from [facility]    Social Network     Social Network: Not on file       Would you be willing to receive help with any of the needs that you have identified today? Not applicable       SHIPPING     Specialty Medication(s) to be Shipped:   CF/Pulmonary/Asthma: -Nebulized colistin (150mg  vials) and supply kit    Other medication(s) to be shipped:  syringes, needles, normal saline flushes, alcohol pads, and neb cup     Changes to insurance: No    Delivery Scheduled: Yes, Expected medication delivery date: 07/15/22.     Medication will be delivered via UPS to the confirmed prescription address in [facility].    The patient will receive a drug information handout for each medication shipped and additional FDA Medication Guides as required.  Verified that patient has previously received a Conservation officer, historic buildings and a Surveyor, mining.    The patient or caregiver noted above participated in the development of this care plan and knows that they can request review of or adjustments to the care plan at any time.      All of the patient's questions and concerns have been addressed.    Oliva Bustard, PharmD   [facility] Specialty Pharmacist

## 2022-07-08 DIAGNOSIS — J3089 Other allergic rhinitis: Principal | ICD-10-CM

## 2022-07-08 DIAGNOSIS — J479 Bronchiectasis, uncomplicated: Principal | ICD-10-CM

## 2022-07-08 MED ORDER — ALBUTEROL SULFATE 2.5 MG/3 ML (0.083 %) SOLUTION FOR NEBULIZATION
Freq: Three times a day (TID) | RESPIRATORY_TRACT | 3 refills | 90 days | Status: CP
Start: 2022-07-08 — End: 2023-07-08

## 2022-07-13 DIAGNOSIS — L570 Actinic keratosis: Secondary | ICD-10-CM | POA: Diagnosis not present

## 2022-07-13 DIAGNOSIS — Z85828 Personal history of other malignant neoplasm of skin: Secondary | ICD-10-CM | POA: Diagnosis not present

## 2022-07-13 DIAGNOSIS — L820 Inflamed seborrheic keratosis: Secondary | ICD-10-CM | POA: Diagnosis not present

## 2022-07-13 DIAGNOSIS — J479 Bronchiectasis, uncomplicated: Principal | ICD-10-CM

## 2022-07-14 NOTE — Unmapped (Signed)
Jordan [name] 's colistimethate shipment will be delayed as a result of a different copay.      I have reached out to the patient  at [phone] and left a voicemail message.  We will wait for a call back from the patient to reschedule the delivery.  We have not confirmed the new delivery date.

## 2022-07-17 DIAGNOSIS — G894 Chronic pain syndrome: Secondary | ICD-10-CM | POA: Diagnosis not present

## 2022-07-17 DIAGNOSIS — J449 Chronic obstructive pulmonary disease, unspecified: Secondary | ICD-10-CM | POA: Diagnosis not present

## 2022-07-17 DIAGNOSIS — E785 Hyperlipidemia, unspecified: Secondary | ICD-10-CM | POA: Diagnosis not present

## 2022-07-17 DIAGNOSIS — M810 Age-related osteoporosis without current pathological fracture: Secondary | ICD-10-CM | POA: Diagnosis not present

## 2022-07-17 DIAGNOSIS — G4733 Obstructive sleep apnea (adult) (pediatric): Secondary | ICD-10-CM | POA: Diagnosis not present

## 2022-07-17 DIAGNOSIS — J479 Bronchiectasis, uncomplicated: Secondary | ICD-10-CM | POA: Diagnosis not present

## 2022-07-22 DIAGNOSIS — E782 Mixed hyperlipidemia: Secondary | ICD-10-CM | POA: Diagnosis not present

## 2022-07-22 DIAGNOSIS — C61 Malignant neoplasm of prostate: Secondary | ICD-10-CM | POA: Diagnosis not present

## 2022-07-22 DIAGNOSIS — E559 Vitamin D deficiency, unspecified: Secondary | ICD-10-CM | POA: Diagnosis not present

## 2022-07-22 DIAGNOSIS — E538 Deficiency of other specified B group vitamins: Secondary | ICD-10-CM | POA: Diagnosis not present

## 2022-07-22 DIAGNOSIS — I10 Essential (primary) hypertension: Secondary | ICD-10-CM | POA: Diagnosis not present

## 2022-07-22 MED FILL — ALCOHOL PREP PADS: 50 days supply | Qty: 100 | Fill #3

## 2022-07-22 MED FILL — BD POSIFLUSH NORMAL SALINE 0.9 % INJECTION SYRINGE: 30 days supply | Qty: 180 | Fill #1

## 2022-07-22 MED FILL — LC PLUS MISC: 30 days supply | Qty: 2 | Fill #3

## 2022-07-22 MED FILL — BD REGULAR BEVEL NEEDLES 21 GAUGE X 1 1/2": 30 days supply | Qty: 60 | Fill #2

## 2022-07-22 MED FILL — BD LUER-LOK SYRINGE 3 ML 21 GAUGE X 1": 30 days supply | Qty: 60 | Fill #5

## 2022-07-22 NOTE — Unmapped (Signed)
Jordan [name] 's colistimethate shipment will be sent out  as a result of copay is now approved by patient/caregiver.      I have reached out to the patient  at [phone] and communicated the delivery change. We will reschedule the medication for the delivery date that the patient agreed upon.  We have confirmed the delivery date as 5/2, via ups.

## 2022-07-23 DIAGNOSIS — J471 Bronchiectasis with (acute) exacerbation: Secondary | ICD-10-CM | POA: Diagnosis not present

## 2022-07-23 DIAGNOSIS — J479 Bronchiectasis, uncomplicated: Secondary | ICD-10-CM | POA: Diagnosis not present

## 2022-07-23 DIAGNOSIS — G4733 Obstructive sleep apnea (adult) (pediatric): Secondary | ICD-10-CM | POA: Diagnosis not present

## 2022-07-28 DIAGNOSIS — E291 Testicular hypofunction: Secondary | ICD-10-CM | POA: Diagnosis not present

## 2022-07-28 DIAGNOSIS — E782 Mixed hyperlipidemia: Secondary | ICD-10-CM | POA: Diagnosis not present

## 2022-07-28 DIAGNOSIS — I471 Supraventricular tachycardia, unspecified: Secondary | ICD-10-CM | POA: Diagnosis not present

## 2022-07-28 DIAGNOSIS — K760 Fatty (change of) liver, not elsewhere classified: Secondary | ICD-10-CM | POA: Diagnosis not present

## 2022-07-28 DIAGNOSIS — G609 Hereditary and idiopathic neuropathy, unspecified: Secondary | ICD-10-CM | POA: Diagnosis not present

## 2022-07-28 DIAGNOSIS — Z0000 Encounter for general adult medical examination without abnormal findings: Secondary | ICD-10-CM | POA: Diagnosis not present

## 2022-07-28 DIAGNOSIS — I10 Essential (primary) hypertension: Secondary | ICD-10-CM | POA: Diagnosis not present

## 2022-07-28 DIAGNOSIS — J479 Bronchiectasis, uncomplicated: Secondary | ICD-10-CM | POA: Diagnosis not present

## 2022-07-28 DIAGNOSIS — I7090 Unspecified atherosclerosis: Secondary | ICD-10-CM | POA: Diagnosis not present

## 2022-07-28 DIAGNOSIS — E46 Unspecified protein-calorie malnutrition: Secondary | ICD-10-CM | POA: Diagnosis not present

## 2022-07-28 DIAGNOSIS — E559 Vitamin D deficiency, unspecified: Secondary | ICD-10-CM | POA: Diagnosis not present

## 2022-08-03 NOTE — Unmapped (Signed)
[facility] [facility]   Assessment:      Patient:Jordan [name] Resides ([birth year])    Jordan [name] is a 67 y.o. male who is seen for follow up of bronchiectasis with chronic Pseudomonas colonization and chronic hypoxemic respiratory failure requiring supplemental oxygen with exertion and sleep. After admission to [facility] for worsening dyspnea despite oral antibiotics, developed hemoptysis. 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  Also found to have tortuous bronchial artery supplying the RUL; s/p BAE 07/11/21. Developed peripheral neuropathy initially postulated to be due to voriconazole though failed to improve with dose reduction.  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       Plan:      Problem List Items Addressed This Visit          Respiratory    Bronchiectasis (CMS-HCC)    Chronic respiratory failure with hypoxia (CMS-HCC) - Primary    Pulmonary aspergilloma (CMS-HCC)       Other    Pseudomonas aeruginosa infection   Therapies:  Patient did not need to meet with the respiratory therapist today.  No changes were made to inhaled medications nor airway clearance regimen.  We discussed the anticipated results from the phase 3 trial of brensocatib and given his continued exacerbation frequency.    Testing:  Patient spontaneously expectorated sputum that was sent for bacterial and AFB cultures. These results will help to guide antibiotic therapy in the future during exacerbations. Unfortunately, had insufficient volume for AFB culture.  If he has future exacerbations, sputum should be sent for bacterial and AFB cultures. The bacterial culture should be processed like a cystic fibrosis sample given the overlapping pathogens. There are standing orders for these cultures in our system.  No labs or imaging needed at this time.    Referrals:  Screening colonoscopy here at [facility] per his preference.    Vaccinations:  COVID-19: Plans to receive elsewhere .  Flu: Up to date  Pneumococcal: Up to date  Tdap: Up to date  RSV: Up to date    Lovell will return to clinic in 3 months for follow-up with pre-bronchodilator spirometry and sputum cultures.  He will call or send me a message via MyChart if questions or concerns arise before this visit. Jordan [name] is in agreement with the above plan.     Subjective:      HPI: Jordan [name] is a 67 y.o. male who is seen for follow up of bronchiectasis with PsA colonization and prior history of multiple lung resections.    09/27/18:  I last saw him on 05/17/18. He reached out at the end of May reporting symptoms c/w an exacerbation. I treated him with a two week course of Ciprofloxacin, which seemed to help.  Reports that he feels winded too much - if walks up incline or up a couple hundred feet. This has worsened over the past year. Has to stop and let lungs catch up.  Of note, he has been off testosterone supplementation for the past year after he was diagnosed with prostate cancer.    He received the [name], which he is using with nebulized HTS 7% and Pulmozyme. After using the Vest for 6-8 weeks, he noticed improvement with more phlegm mobilization. Sometimes has to pause it to cough to get stuff up.  Plugging feeling happening a lot more, especially with the vest.  Most of the time, sputum is thinned out with Pulmozyme but still gets those plugs.  Had to stop eating ice cream or drinking milkshake - will cough will eating and that makes him vomit on occasion. Past few weeks, when eating will cough, no matter what he eats.  Happens almost once a day. Doesn't feel like things get stuck typically.      02/07/19:  Following his last visit, Suraj reached out regarding portable oxygen.  We had considered the life 2000 but he does not qualify because he does not use supplemental oxygen.  He performed the 6-minute walk test prior to today's visit but did not desaturate.  He wonders if this is because the test was done in the morning and he doesn't have breathing issues in the morning.  Typically, breathing issues occur in afternoon.  During these episodes, he will have to use his inhaler and feels like he is suffocating.  He describes it as an uncomfortable feeling that limits his ability to do things with family.  If he is having trouble catching breath, oximetry is 92-93%. Has seen it in the 80s if comes in from outside and is short of breath.    For airway clearance, he is nebulizing 10% hypertonic saline and using his percussive vest for 20 minute sessions in AM and PM. Has to pause it to cough and get stuff up.  Coughing up more stuff since last visit and is waking up 1-2 times a night with cough.having coughing fits between clearance session.  What he expectorates is less of a mucous plug and mostly stringy sputum that is yellowish to light brown in color.  No hemoptysis.  On Saturday, he had a coughing fit that nearly triggered posttussive emesis.  He only expectorated about 3-4 globs of phlegm.  Nebulizing Pulmozyme daily.  Alternates [name] and colistin every other month.  On [name] this month and will start Colistin on Dec 1.    06/06/19:  Coughing more and more sputum.  Waking up 1-2 times per night coughing. Can cough once or just keep coughing.  Sputum is yellow in color without evidence of blood.  He is not expectorating green sputum currently. On colistin this month; alternates with [name]. Went through January off all inhaled antibiotics.  Oxygen levels in the low 90s.  Uses rescue inhaler if goes up stairs.  Regularly performing airway clearance with nebulized 10% hypertonic saline twice a day, percussive vest twice a day, and Pulmozyme daily. He last received IV antibiotics in January 2019 but has been treated with oral antibiotics on 04/19/18 and 08/17/18, 03/10/19.      11/10/19:  1-2 times per week can get sharp right sided chest pain - even with nipple line. Can happen with clearance or activity or rest. Never during sleep. Following his hospitalization, he first experienced it in June. Lasts only about 10 seconds. Not reproducible. Limited activity due to dyspnea. Regularly performing airway clearance with nebulized 10% hypertonic saline twice a day, percussive vest twice a day, and Pulmozyme daily. Remains on chronic azithromycin therapy.    02/13/20:  Showed up to [facility] with oxygen saturations of 87-88% after walking from parking lot (<100 yd) and up incline without supplemental oxygen.  Using 2L continuous flow with exertion and sleep.  Had sleep study done at [facility] on RA and was told that he should be fine with just the supplemental oxygen. I do not have this report. Using oxygen when goes to football game; needs long period to recover from activity.  Going up 20 foot incline with oxygen,  will have to stop and catch breath.     Wheezing more lately and endorses chest tightness. Has need to use albuterol as rescue 3-4 times per day.  Used albuterol in the car on the way here. Typically would be able to cough up large amount of mucus after PFTs but only coughed up small amount. Feels like mucus is stuck in lungs despite twice daily airway clearance with 10% HTS and Vest. Also using Pulmozyme once a day and Symbicort twice a day after clearance.  Using colistin this month. He ordered the [name] nebs to use next month even though insurance won't cover it. Feels like inhaled antibiotics definitely help.    05/10/20:   Saw [facility] for lung transplant evaluation.  Felt to be poor candidate due to prior surgeries and primary disease.  Candidacy closed at [facility]. Referral sent to [facility] on 2/15.  Based on labs and testing done there, getting Hep B vaccination series (not Hep A or Hep B immune).  Also getting DEXA that was ordered by Dr. Abigail Miyamoto.    Had PSG done at [facility] - no evidence of OSA but does have increased upper airway resistance.  He never sleeps on his back; only sleeps on side or stomach.   Fatigue is most burdensome symptom.    Regrets that he let the surgeon operate on his lung before he met me.  He completed pulmonary rehab at [facility] but they only had him on machine for ~30 minutes and then stretching.  No strength exercises. He is planning on joining a gym based on recommendations from [facility] transplant.  Was surprised that he didn't desaturate during his walk test at [facility].    07/02/20:  At last visit, referred him to [facility] for pulmonary rehab. Found to have osteoporosis in hips. Turned down for transplant at [facility] based on chart review.  No change in breathing or cough.  Every once in a while, has bad coughing spells.  Same sputum volume and color. Globby to stringy. Not dark brown or bloody.  Sputum is light brown to yellowish.  Breathing about the same.  Started on 1200 mg calcium for osteoporosis.  Starting telehealth pulmonary rehab - feels optimistic that it will help him do more.    10/31/20:  Heart monitor for pulm rehab - had 40 events of fast rate or afib.  Has felt heart fluttering.  Did event monitor for 8 days.  Getting echocardiogram on Wednesday and then seeing cardiologist the next week.     Was having sharp pains over right abdomen below ribs.  Once a month, now once every 2 weeks.    03/18/21:  Since his last clinic visit, he was evaluated by cardiology at [facility] (October) and then was admitted to [facility] earlier this month due to hemoptysis that occurred while on ciprofloxacin for an exacerbation.  He also had worsening shortness of breath.  Was discharged home on 21 days of meropenem.  Did not undergo embolization or bronchoscopy.     Hasn't felt good since August.  No energy, lost weight. End Nov, asked for Cipro and for few days felt better. Then didn't feel better and was ordeal to see PCP.  WBC elevated in ER - they called me. Friday night/Saturday started seeing dark blood in sputum.  Monday night 8PM, got breathing treatment with bright red globs.  Large volume blood and phlegm. They were going to do bronchoscopy but then bleeding improved with change  in abx, holding 10% saline. Was continued on percussive vest and Pulmozyme. Friday to Saturday, wine colored.  Then pinkish. Sunday night could barely see blood and then gone Monday.    Finished Meropenem last night (3 weeks total). Christmas eve morning, started seeing bright red streaks. Yesterday, coughed up bright red blood that was 50/50 with phlegm.  Today more wine colored.   Shortness of breath better with meropenem but no change in hemoptysis.    07/01/21:  After last visit, we attempted to perform bronchoscopy but due to hypoxemia, we canceled the procedure.  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  He also had a visit with Dr. Erven Colla in interventional radiology and is scheduled for bronchial artery embolization later this month.    Feels off balance but not dizzy/spinning.  This has been intensifying with increasing the voriconazole dose. Happening daily every time he stands up. No hearing changes. Head feels heavy.  No vision changes nor double visions. Sinuses up and down, bleed a little bit.  Some post-nasal drip.     10/14/21:  At last visit, pursued Life2000 for his work of breathing. Started mirtazapine for appetite, mood, and sleep. Underwent BAE on 07/11/21. Tapering off voriconazole due to development of peripheral neuropathy and lower extremity swelling.  Had been on voriconazole for about 5 months.    Swelling started slightly in April and has progressed. Neuropathy started in tips of toes a few weeks ago but now at ankles.  More shortness of breath when laying flat.  Has woken up at night short of breath.  Work of breathing outside is better on L2K.    11/21/21:  At last visit, reporting ascending peripheral neuropathy and more rapidly progressive lower extremity edema. Planned to continue voriconazole 100 mg daily and repeat chest CT to see if any evolution of his mycetoma. No lab evidence that he is absorbing his inhaled antibiotics.    Since last visit, swelling seems to have increased.  Lasix seemed to help for 2 days but then no difference aside from no longer waking up night to urinate.  Neuropathy to the ankles bilaterally.  Feeling more swollen in legs and even abdomen feels     04/14/22:  At last visit, stopped voriconazole, ordered CT abd/pelvis, referred to endocrinology. Since then, he saw speech therapy for swallowing exercises.  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 Additional left-sided lumbosacral radiculopathy (L5-S1) of chronic nature is also noted.    Since that visit, reached out reporting symptoms of exacerbation. He dropped a sputum at [facility] for culture but there is nothing in their system showing a sample was received/process.  Prescribed Cipro but didn't feel like he got as much improvement from it as usual.  Feeling washed out. Neg COVID tests.  No fever.     08/04/22:  At last visit, wasn't feeling 100% after oral/inhaled antibiotics and we discussed potentially treating with IV antibiotics as outpatient. Having periods of time when coughing spells worsen and then improve again.  Did treat him with Augmentin in early February.  Has been having difficulty with L2K unit at home.      Waiting for L2K to work - 2 months. Got new regulator and then stopped working.  Spoke with someone there and since it works on [employer] research associate, told its the regulator.  [employer] is sending someone from [place] to prove that  its the regulator.  If oxygen briefly in home with activity, drops to mid-80s.    Respiratory Symptoms:   Cough: Since last visit, having bad spells - last night, had one and then threw up.  Sputum is medium brown.  Nocturnal awakenings: 1-2 times per month   Wheezing: absent.  Chest tightness: absent.  Rescue albuterol use: 2-3 times in past week but better after coughing up stuff.  Pleurisy: sporadic once every other day over prior resection.  Hemoptysis: Nothing since embolization.  MMRC Stage: 3 = I stop for breath after walking about 100 yards or after a few minutes on level ground. Finds L2K helpful when outside.  Finds that he is able to get so immersed in what he is doing that the L2K starts beeping because either battery or oxygen tank is low.    Exacerbations:   Number of exacerbations treated in the past year: 3  Dates of exacerbations: 03/2018, 07/2018, 02/2019, 09/2019, 12/2019, 10/2020, 01/2021, 07/2021, 03/2022, 04/2022  Number of hospitalizations for exacerbations in the past 2 years: 2  Dates of hospitalizations: 01/2015, 03/2015, 01/2016, 03/2017, 09/2019, 10/2020, 02/2021    Pulmonary Therapies:   Airway clearance: Pulmozyme daily, HTS 10% bid, percussive vest bid.   Inhaled antibiotics: Alternates [name] and Colistin monthly - on Colistin currently.  Inhalers: Symbicort 160/4.5, Spiriva Respimat  Chronic antibiotics: azithromycin 250 mg daily.   Exercise: Was walking 3 miles at 2.1 without stopping on the treadmill.  Pulmonary Rehab: Telehealth pulmonary rehab July 2022  Supplemental oxygen: 2L continuous at rest and with sleep. Increased to 4-5L with activity since L2K not working - regulator part broke.  NIPPV: No    ROS: No F/C/NS. Weight stablized and less edema now that off the mirtazipime.    Past Medical History:   Diagnosis Date    Abscess of lung (CMS-HCC) 11/06/2010    CT Chest 10/22/10 12/05/2010 right thoracotomy resection of right middle lobe and resection of right lower lobe abscess     Arthritis     back    Biceps tendon tear 2013    left side    Bronchiectasis (CMS-HCC)     chronic psuedomonas infection    Degenerative joint disease of left acromioclavicular joint     GERD (gastroesophageal reflux disease)     Hypertension     Obstructive sleep apnea on CPAP 03/14/13    AHI 33.5, on BiPAP 12/8 based on sleep study 09/2013    Pneumonia 2012 Lung abcess     PONV (postoperative nausea and vomiting)     Prostate cancer (CMS-HCC) 09/09/2017    Rotator cuff injury left    Vertigo        Past Surgical History:   Procedure Laterality Date    APPENDECTOMY  1969    BACK SURGERY      BRONCHOSCOPY  10/19/2010    [facility]    CARPAL TUNNEL RELEASE  1999    IR EMBOLIZATION HEMORRHAGE ART OR VEN  LYMPHATIC EXTRAVASATION  07/11/2021    IR EMBOLIZATION HEMORRHAGE ART OR VEN  LYMPHATIC EXTRAVASATION 07/11/2021 Braulio Conte, MD IMG VIR H&V [facility]    LUNG REMOVAL, PARTIAL Right 10/2010    RML and RLL partially resected    MENISCECTOMY  2011    NECK SURGERY  1993, 1999, 2002    PR GERD TST W/ MUCOS IMPEDE ELECTROD,>1HR N/A 07/06/2012    Procedure: ESOPHAGEAL FUNCTION TEST, GASTROESOPHAGEAL REFLUX TEST W/ NASAL CATHETER INTRALUMINAL IMPEDANCE ELECTRODE(S) PLACEMENT, RECORDING, ANALYSIS AND  INTERPRETATION; PROLONGED;  Surgeon: None None;  [facility]: [facility];  Service: Gastroenterology    PR OPEN TREAT RIB FRACTURE W/INT FIXATION, UNILATERAL, 1-2 RIBS Right 03/19/2015    Procedure: OPEN TREATMENT OF RIB FRACTURE REQUIRING INTERNAL FIXATION, UNILATERAL; 1-2 RIBS;  Surgeon: Evert Kohl, MD;  Location: MAIN OR [facility];  Service: Cardiothoracic    PR THORACOTOMY W/THERAP WEDGE RESEXN ADDL IPSILATRL Right 08/07/2013    Procedure: THORACOTOMY; WITH THERAPEUTIC LOBECTOMY OF RIGHT MIDDLE AND RIGHT LOWER LOBE RESECTION , EACH ADDITIONAL RESECTION, IPSILATERAL;  Surgeon: Alvester Chou, MD;  Location: MAIN OR [facility];  Service: Cardiothoracic    TONSILLECTOMY  1965       Family History   Problem Relation Age of Onset    Alzheimer's disease Mother     Stroke Mother     Bronchiectasis  Brother         presumed    Bronchiectasis  Brother         presumed    Liver disease Father     Diabetes Father     Heart failure Father     Bronchiectasis  Daughter     Asthma Son         childhood asthma    Clotting disorder Neg Hx     Anesthesia problems Neg Hx        Social History     Tobacco Use    Smoking status: Never    Smokeless tobacco: Never   Substance Use Topics    Alcohol use: No     Alcohol/week: 0.0 standard drinks of alcohol    Drug use: No       Allergies  Reviewed on 08/04/2022        Reactions Comments    Gentamicin Other (See Comments) BALANCE ISSUES    Tobramycin Other (See Comments) ototoxicity    Mirtazapine Other (See Comments) Lower extremity swelling    Morphine Nausea And Vomiting           Current Outpatient Medications   Medication Sig Dispense Refill    albuterol 2.5 mg /3 mL (0.083 %) nebulizer solution Inhale 3 mL by nebulization Three (3) times a day. 810 mL 3    alcohol swabs (ALCOHOL PREP PADS) PadM Use as directed with inhaled antibiotics 100 each 5    ascorbic acid, vitamin C, (VITAMIN C) 1000 MG tablet Take 1 tablet (1,000 mg total) by mouth daily.      atorvastatin (LIPITOR) 10 MG tablet Take 1 tablet (10 mg total) by mouth daily.      azithromycin (ZITHROMAX) 250 MG tablet TAKE 1 TABLET BY MOUTH EVERY DAY 90 tablet 3    budesonide-formoterol (SYMBICORT) 80-4.5 mcg/actuation inhaler INHALE 2 PUFFS TWO TIMES A DAY. 30.6 g 3    calcium carbonate (CALCIUM 600 ORAL) Take 2 tablets by mouth two (2) times a day. 1 in morning and 1 in the evening      ciprofloxacin HCl (CIPRO) 750 MG tablet Take 1 tablet (750 mg total) by mouth two (2) times a day. 28 tablet 0    coenzyme Q10 300 mg cap Take 10-300 mg by mouth daily.      colistimethate (COLYMYCIN) 150 mg injection Inject 2 mL sterile water for injection to mix colistin vial, then draw up 2 mL (150mg ) and inhale 2 times a day, 28 days on and 28 days off. 60 each 5    dilTIAZem (CARDIZEM CD) 120 MG 24 hr capsule Take 1 capsule (120  mg total) by mouth daily.      dornase alfa (PULMOZYME) 1 mg/mL nebulizer solution Inhale 1 ampule (2.5 mg) daily. Use at least 30-60 minutes before airway clearance, or after airway clearance 225 mL 3    empty container (SHARPS CONTAINER) Misc USE AS DIRECTED 1 each 0    empty container Misc USE AS DIRECTED 1 each 2    empty container Misc Use as directed to dispose of needles. When full, make sure lid is closed tightly then dispose of container in trash. 1 each 2    ferrous sulfate 325 (65 FE) MG tablet Take 1 tablet (325 mg total) by mouth Two (2) times a day. BID      fluticasone propionate (FLONASE) 50 mcg/actuation nasal spray 1 spray into each nostril daily.      HYDROcodone-acetaminophen (NORCO) 7.5-325 mg per tablet Take 1 tablet by mouth three (3) times a day (at 6am, noon and 6pm).      L. acidophilus/Bifid. animalis 32 billion cell cap Take 2 capsules by mouth daily.      montelukast (SINGULAIR) 10 mg tablet TAKE 1 TABLET BY MOUTH EVERY DAY AT NIGHT 90 tablet 3    multivitamin-minerals-lutein Tab Take 1 tablet by mouth daily.      nebulizers (LC PLUS) Misc Use with inhaled medications 1 each 5    nebulizers Misc use with nebulized medications 12 each 0    needle, disp, 21 G (BD REGULAR BEVEL NEEDLES) 21 gauge x 1 1/2 Ndle Use as directed with inhaled Colistin 100 each 3    omeprazole (PRILOSEC) 20 MG capsule Take 1 capsule (20 mg total) by mouth Two (2) times a day.      OXYGEN-AIR DELIVERY SYSTEMS MISC Inhale 2 mL.      sodium chloride (BD POSIFLUSH NORMAL SALINE 0.9) 0.9 % injection Inject 2mL of 0.9%NaCl into colistin vial & gently mix. After withdrawing colistin dose, add an additional 1mL of 0.9%NaCl to neb cup with the colistin dose. 180 mL 11    sodium chloride 10 % Nebu Measure and Inhale 5 mL by nebulization Two (2) times a day. Discard remaining amount and use a new vial for each dose. 900 mL 3    sterile water Soln Use 2mL to mix Colistin, then add additional 2mL to neb cup with 2mL of mixed colistin. Inhale twice daily 28 days on and 28 days off. 600 mL 5    syringe with needle (BD LUER-LOK SYRINGE) 3 mL 20 gauge x 1 1/2 Syrg For use with inhaled antibiotic (Colistin) 60 each 30    syringe with needle (BD LUER-LOK SYRINGE) 3 mL 21 gauge x 1 Syrg For use with inhaled antibiotic (Colistin) 60 each 5    tiotropium bromide (SPIRIVA RESPIMAT) 2.5 mcg/actuation inhalation mist Inhale 2 puffs daily. 12 g 3    tobramycin, PF, ([name]) 300 mg/5 mL nebulizer solution Inhale the contents of 1 ampule (300 mg total) by nebulization every twelve (12) hours. 28 days on and 28 days off. 280 mL 5    zinc gluconate 50 mg (7 mg elemental zinc) tablet Take 1 tablet (50 mg total) by mouth.      zoledronic acid-mannitol&water (RECLAST) 5 mg/100 mL PgBk       albuterol HFA 90 mcg/actuation inhaler Inhale 2 puffs Four (4) times a day. (Patient taking differently: Inhale 2 puffs four (4) times a day as needed.) 25.5 g 3     No current facility-administered medications for this visit.  Physical Exam:   BP 128/70 (BP Site: L Arm, BP Position: Sitting, BP Cuff Size: Medium)  - Pulse 78  - Wt 81.6 kg (180 lb)  - SpO2 92%  - BMI 26.97 kg/m??   Fatigued appearing white male in no acute distress. Easy work of breathing without accessory muscle use. Coarse crackles throughout lung fields.  Diminished breath founds over RLL. Expiratory wheeze over RUL. Speaking in full sentences.  Regular rate and rhythm with normal S1 and S2.  No murmurs, rubs, or gallops.  Clubbing unchanged from prior.    Diagnostic Review:   The following data were reviewed during this visit with key findings summarized below:  Pulmonary Function Testing:      Spirometry consistent with severe airway obstruction and suggestive of moderate restriction.  Measures are stable from prior but below those from about 18 months ago. Normal inspiratory loop.    FVC (% predicted) FEV1 (% predicted) FEV1/FVC   11/10/2013 3.19 L (68%) 2.16 L (61%)  68%   02/23/2014 3.50 L (76%)  2.23 L (64%)  64%   07/12/2014  2.94 L (66%)  2.02 L (58%)  69%   01/15/2015  2.82 L (62%)  1.79 L (52%)  63%   02/08/2015  3.04 L (66%)  2.06 L (59%)  68%   04/02/2015  2.86 L (63%)  1.83 L (53%)  64%   12/10/2015  2.77 L (62%)  1.62 L (48%)  58%   01/27/2016 2.42 L (54%)  1.55 L (46%)  64%   03/24/2016  3.36 L (75%)  1.99 L (59%)  59%   07/21/2016  2.56 L (57%)  1.54 L (46%)  60%   12/29/2016  2.80 L (63%)  1.57 L (47%)  56%   04/06/2017  2.61 L (59%)  1.46 L (44%)  56%   07/06/2017  2.56 L (58%)  1.49 L (45%)  58%   10/12/2017  2.63 L (59%)  1.47 L (44%)  56%   01/18/2018  2.68 L (63%)  1.36 L (42%)  51%   05/17/2018 2.49 L (57%)  1.25 L (38%)  50%   09/26/2018 2.43 L (57%)  1.26 L (39%)  52%   01/26/2019 2.16 L (51%)  1.16 L (36%)  54%   06/06/2019 2.47 L (59%) 1.11 L (34%) 45%   11/10/19 2.14 L (50%) 1.20 L (37%) 56%   02/13/20 2.01 L (48%) 1.08 L (34%) 54%   05/02/20 2.29 L (54%) 1.11 L (34%) 48%   07/02/20 (pre) 2.11 L (51%) 1.17 L (37%) 56%   07/02/20 (post) 2.26 L (54%) [+7.2%] 1.22 L (38%) [+4.0%] 54%   10/31/20 2.46 L (59%) 1.17 L (37%) 48%   07/01/21 2.33 L (56%) 1.13 L (36%) 48%   09/29/21 1.92 L (47%) 1.01 L (32%) 53%   11/21/21 2.10 L (51%) 1.03 L (33%) 49%   04/14/22 2.09 L (54%) 1.05 L (35%) 50%   08/04/22 1.98 L (51%) 1.00 L (34%) 50%     6-minute Walk (12/06/19): Heart Rate: Resting Heart Rate was 71 bpm. Maximum Heart Rate achieved 97 bpm. Patient did reach target heart rate (85-132 bpm).  Heart rate returned to 77 bpm after 2 minutes of recovery.  Oxygen Saturation: Resting O2 Saturation was 94% on RA. O2 saturation nadir was 87% at minute 2. O2 saturation improved to 96% with 2L via [place] at minute 3 and maintained saturations above 88% for remainder of walk. O2 saturation returned to 96% on room  air after 2 minutes of recovery.  Distance: Total 6 minute walk distance was 320 m. Predicted 6 minute walk distance 548.9 m (LLN of 395.8 m). Percent of predicted 6 minute walk distance of 58%. Testing included one 5-minute pause. Distance obtained after placed on 2L and walked for an additional 6 minutes.  BORG: Resting BORG score was 1. Maximum BORG score was 5 at minute 2. Concluding BORG score was 0 after 2 minutes of recovery. Impression: Significant exercise induced oxygen desaturation (below 88%). Exertional hypoxemia improved with 2L supplemental oxygen. Walk distance is less than expected suggesting a cardio-respiratory defect -- clinical correlation suggested.  Recommendation: Supplemental oxygen required with exercise at 2L [place]. Consider further evaluation with spirometry, lung volumes, respiratory muscle flow/force measurements, cardiopulmonary stress testing, and/or cardio/vascular studies.    6-Minute Walk (02/15/19): Resting Heart Rate was 91. Maximum Heart Rate achieved 119. Patient did reach target heart rate (86-133). Resting O2 Saturation was 98% on RA. Resting BORG score was 1. O2 saturation nadir was 91% at minute 6. Total 6 minute walk distance was 381 meters. Predicted 6 minute walk distance of 558 m (LLN of 438m). Percent of predicted 6 minute walk distance of 68%. Heart rate returned to 96 bpm after 2 minutes of recovery. O2 saturation returned to 96% on RA after 2 minutes of recovery. Maximum BORG score was 7 at minute 6. Concluding BORG score was 2 after 2 minutes of recovery. No evidence of resting or exercise induced hypoxemia. No supplemental oxygen is required at this time.    6-Minute Walk (01/26/19): Resting Heart Rate was 86. Maximum Heart Rate achieved 97. Patient did reach target heart rate (86-133). Resting O2 Saturation was 99% on RA. Resting BORG score was 1. O2 saturation nadir was 95% at minutes 3 and 6. Total 6 minute walk distance was 350 meters. Predicted 6 minute walk distance of 558 m (LLN of 489m). Percent of predicted 6 minute walk distance of 63%. Heart rate returned to 86 bpm after 2 minutes of recovery. O2 saturation returned to 96% on RA after 2 /minutes of recovery. Maximum BORG score was 6 at minutes 5 and 6. Concluding BORG score was 1 after 2 minutes of recovery. No evidence of resting or exercise induced hypoxemia. No supplemental oxygen is required at this time.    Cultures:    Source Bacterial Culture AFB Smear AFB Culture   04/02/15 Sputum 2+ OPF; 1+ Probably mPsA Negative Negative   07/02/15 Sputum 4+ OPF; 3+ mPsA; 3+ sPsA; 1+ Steno Negative Negative   01/23/16 Sputum 2+ OPF; 1+ mPsA; 1+ sPsA - -   03/24/16 Sputum 3+ OPF; 3+ mPsA; 3+ sPsA Negative Overgrown   07/21/16 Sputum 4+ OPF; 4+ mPsA; 2+ sPsA Negative Negative   12/29/16 Sputum 4+ OPF; 3+ mPsA; 3+ sPsA Negative Actinomadura sp   04/06/17 Sputum 3+ OPF; 4+ mPsA Negative Negative   07/06/17 Sputum 4+ OPF; 3+ mPsA Negative Negative   10/12/17 Sputum 4+ OPF; 3+ mPsA Negative Negative   01/18/18 Sputum OPF Negative Negative   05/17/18 Sputum 4+ OPF; 3+ mPsA - -   09/26/18 Sputum 4+ OPF; 3+ mPsA Negative Negative   01/26/19 Sputum 4+ OPF; 2+ mPsA Negative Negative   02/15/19 Sputum 3+ OPF; 3+ mPsA Negative Negative   06/06/19 Sputum 3+ OPF; 3+ sPsA; 3+ mPsA Negative Negative   11/10/19 Sputum 2+ OPF; 3+ mPsA Negative Negative   02/13/20 Sputum OPF Negative Negative   07/02/20 Sputum 3+ OPF; 1+sPsA; 3+ mPsA negative negative  10/31/20 Sputum 4+ OPF; 2+ sPsA; 4+ mPsA negative negative   11/17/20 Sputum 3+ OPF; 3+ mPsA negative negative   02/21/21 Sputum Few PsA negative negative   02/27/21 Sputum Scant C albicans and light A fumigatus - -   03/18/21 Sputum 2+ OPF; 1+ mPsA negative negative   03/26/21 Sputum 4+ OPF; 3+ mPsA negative negative   09/29/21 Sputum 4+ OPF; 4+ sPsA; 4+ mPsA negative negative   04/14/22 Sputum 4+ OPF; 1+ mPsA negative negative      Aspergillus Ag BAL/serum (04/02/21): 0.55 (@ [facility])     Bronchiectasis Evaluation   IgG with subclasses:   Lab Results   Component Value Date/Time    IGGT 996 04/28/2012 06:21 AM    IGG1 402 04/28/2012 06:21 AM    IGG2 399 04/28/2012 06:21 AM    IGG3 148.0 (H) 04/28/2012 06:21 AM    IGG4 47.7 04/28/2012 06:21 AM     IgA (09/21/11): 283   IgM (09/21/11): 52   IgE:   Lab Results   Component Value Date/Time    IGE 16.6 02/13/2020 10:24 AM     Specific Titers: No results found for: TETANUSAB, TETGV, DIPHTERIAAB, DIPGV, S. pneumonia titers  HIV (05/02/20): Non-reactive  Autoimmune (05/02/20): ANA negative   ANCA: No results found for: ANCA, IFA, PR3ELISA, PR3QT, MPO, MPOQT  CBC-D (05/02/20): 6.1>14.8/44.3<156; ANC 4.1; ALC 1.2; AEC 0.17  Lab Results   Component Value Date/Time    WBC 4.1 11/20/2020 04:03 AM    WBC 6.1 11/19/2020 03:39 AM    WBC 7.7 11/18/2020 03:29 AM    WBC 7.9 08/13/2013 03:57 AM    WBC 9.4 08/12/2013 04:21 AM    WBC 11.7 (H) 08/11/2013 04:04 AM    HGB 11.8 (L) 11/20/2020 04:03 AM    HGB 14.8 03/24/2015 05:31 PM    HCT 34.5 (L) 11/20/2020 04:03 AM    HCT 30.8 (L) 08/13/2013 03:57 AM    PLT 134 (L) 11/20/2020 04:03 AM    PLT 292 08/13/2013 03:57 AM    NEUTROABS 7.8 11/17/2020 02:31 PM    NEUTROABS 3.7 02/13/2020 10:24 AM    NEUTROABS 3.2 04/19/2017 05:45 AM    NEUTROABS 4.9 06/21/2013 09:31 PM    NEUTROABS 4.6 06/21/2013 08:13 PM    NEUTROABS 4.1 04/26/2012 11:54 PM    LYMPHSABS 1.4 11/17/2020 02:31 PM    LYMPHSABS 1.4 02/13/2020 10:24 AM    LYMPHSABS 1.4 (L) 04/19/2017 05:45 AM    LYMPHSABS 1.9 06/21/2013 09:31 PM    LYMPHSABS 1.8 06/21/2013 08:13 PM    LYMPHSABS 2.8 04/26/2012 11:54 PM    EOSABS 0.2 11/17/2020 02:31 PM    EOSABS 0.2 02/13/2020 10:24 AM    EOSABS 0.2 04/19/2017 05:45 AM    EOSABS 0.2 06/21/2013 09:31 PM    EOSABS 0.2 06/21/2013 08:13 PM    EOSABS 0.3 04/26/2012 11:54 PM    BASOSABS 0.0 11/17/2020 02:31 PM    BASOSABS 0.0 06/21/2013 09:31 PM     Alpha-1 level: No results found for: A1TRYP  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      IgE (05/23/18):  Imaging  Chest CT (06/15/22): Images personally reviewed.  Minimal change from prior CT though there is a slightly smaller focus of soft tissue/consolidation, possibly reflective of mycetoma, within complex cavitary lesion in the apex of the right lung.  New small focus of endobronchial debris within the cystic airspaces of the lingula.    Chest CT (11/14/21): Images personally reviewed. I agree with radiology interpretation that he continues to have extensive cavitary consolidation in right apex with areas of dependent soft tissue within medial component of cavity; findings unchanged and c/w mycetoma. Ill-defined nodular consolidation in right posterior lung. S/p resection of right lower lobe. Unchanged extensive bronchiectasis. Mosaic attenuation c/w gas trapping 2/2 small airways disease.     CTA Chest (04/15/21): Images personally reviewed. Sequelae of right middle lobectomy and right lower lobe wedge resection. Chronic consolidation in the medial posterior right upper lobe and right lung apex with volume loss and architectural distortion of the right upper lobe. Compensatory hyperexpansion of the left lung with mosaic attenuation consistent with areas of air trapping. Extensive chronic bilateral bronchiectasis and bronchial thickening. Unchanged diffuse bilateral tree-in-bud nodularity.  Mildly enlarged and tortuous right bronchial artery, predominantly supplying the right upper lung, arising from the lateral descending aorta at the level of T6 (7:58, 4:38). No enlarged left-sided bronchial arteries. No CT evidence of intrapulmonary arteriovenous malformation.     Chest CT (02/25/21): Images personally reviewed.  Stable sequelae of right middle lobectomy and right lower lobe wedge resection. 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 Stable bilateral diffuse extensive bronchiectasis, bronchial wall thickening, and sequelae of chronic endobronchial infection.        CXR (05/02/20): Images not available for review. Per report, Visualized cardiac and mediastinal contours within normal limits allowing for mediastinal shift to the right. Circumferential right pleural fluid/thickening. Volume loss in the right lung consistent with known right lung resection. Irregular opacities throughout both lungs, right greater than left, consistent with known bronchiectasis.     Chest CT (11/10/19): Images personally reviewed. Stable sequelae of right middle lobectomy and right lower lobe wedge resection. Stable consolidation in the medial posterior right upper lobe and right lung apex, bilateral diffuse extensive bronchiectasis and bronchial wall thickening and unchanged sequelae of chronic endobronchial infection.    Chest CT (02/15/19): Images personally reviewed. Stable sequelae of right middle lobectomy and right lower lobe wedge resection.  Unchanged diffuse extensive bronchiectasis and bronchial wall thickening bilaterally.  Diffusely scattered tree-in-bud nodules in bilateral lung parenchyma with interval increase in tree-in-bud nodules in left lung; the constellation of these findings are consistent with sequelae of worsening chronic endobronchial infection.    Chest CT (03/31/16): Images personally reviewed. Status post right middle and right lower lobectomies. Tiny loculated basilar right hydropneumothorax, probably chronic. Moderate cylindrical and varicoid bronchiectasis throughout both lungs, with associated scattered mucoid impaction, tree-in-bud opacities and scattered peribronchovascular and subpleural consolidation in the right upper lobe. 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 Two-vessel coronary atherosclerosis. Nonobstructing right nephrolithiasis.    Modified Barium Swallow (01/17/19): Shallow laryngeal penetration observed with thin liquids and puree.  No aspiration identified with any of the tested consistencies.    Immunization History   Administered Date(s) Administered    COVID-19 VAC,BIVALENT(83YR UP),PFIZER 12/17/2020    COVID-19 VAC,MRNA,TRIS(12Y  UP)(PFIZER)(GRAY CAP) 12/01/2019, 05/03/2020    COVID-19 VACC,MRNA,(PFIZER)(PF) 04/20/2019, 05/13/2019, 05/18/2019, 12/01/2019    Covid-19 Vac, (69yr+) (Comirnaty) [employer]  01/07/2022    HEPATITIS B VACCINE ADULT,IM(ENERGIX B, RECOMBIVAX) 05/13/2020, 06/11/2020, 11/13/2020    Hepatitis A (Adult) 05/13/2020, 11/13/2020    INFLUENZA QUAD ADJUVANTED 33YR UP(FLUAD) 11/25/2021    INFLUENZA TIV (TRI) 88MO+ W/ PRESERV (IM) 11/13/2010, 01/07/2012, 12/09/2017, 11/30/2019, 12/17/2020    INFLUENZA TIV (TRI) PF (IM) 02/20/2012, 12/09/2012    Influenza Vaccine Quad(IM)6 MO-Adult(PF) 01/31/2016, 12/09/2017    Influenza Virus Vaccine, unspecified formulation 12/25/2014, 12/01/2016    Influenza Whole 12/21/2008    PNEUMOCOCCAL POLYSACCHARIDE 23-VALENT 10/22/2010, 02/07/2016, 10/07/2019    Pneumococcal Conjugate 13-Valent 04/28/2013    RSV VACCINE,ADJUVANTED(PF)(AREXVY) 01/07/2022    TdaP 03/22/2012, 07/23/2021    ZOSTAVAX - ZOSTER VACCINE, LIVE, SQ 07/03/2014, 06/11/2017, 01/19/2018     Portions of this record have been created using Dragon dictation software. Dictation errors have been sought, but may not have been identified and corrected.

## 2022-08-04 ENCOUNTER — Ambulatory Visit: Admit: 2022-08-04 | Discharge: 2022-08-04 | Payer: PRIVATE HEALTH INSURANCE

## 2022-08-04 ENCOUNTER — Ambulatory Visit
Admit: 2022-08-04 | Discharge: 2022-08-04 | Payer: PRIVATE HEALTH INSURANCE | Attending: Internal Medicine | Primary: Internal Medicine

## 2022-08-04 DIAGNOSIS — J9611 Chronic respiratory failure with hypoxia: Secondary | ICD-10-CM | POA: Diagnosis not present

## 2022-08-04 DIAGNOSIS — B441 Other pulmonary aspergillosis: Secondary | ICD-10-CM | POA: Diagnosis not present

## 2022-08-04 DIAGNOSIS — Z1211 Encounter for screening for malignant neoplasm of colon: Secondary | ICD-10-CM | POA: Diagnosis not present

## 2022-08-04 DIAGNOSIS — A498 Other bacterial infections of unspecified site: Secondary | ICD-10-CM | POA: Diagnosis not present

## 2022-08-04 DIAGNOSIS — J479 Bronchiectasis, uncomplicated: Secondary | ICD-10-CM | POA: Diagnosis not present

## 2022-08-14 NOTE — Unmapped (Signed)
[facility] Pharm[facility] Refill Coordination Note    Specialty Medication(s) to be Shipped:   CF/Pulmonary/Asthma: -Generic inhaled tobramycin 300mg /45mL inhalation solution    Denied colistimethate as not due until July (28 days on, 28 days off)    Other medication(s) to be shipped: Jordan [name], DOB: 07/09/1955  Phone: [phone] (home)       All above HIPAA information was verified with patient.     Was a Nurse, learning disability used for this call? No    Completed refill call assessment today to schedule patient's medication shipment from the [facility] [phone]).  All relevant notes have been reviewed.     Specialty medication(s) and dose(s) confirmed: Regimen is correct and unchanged.   Changes to medications: Jordan [name] reports no changes at this time.  Changes to insurance: No  New side effects reported not previously addressed with a pharmacist or physician: None reported  Questions for the pharmacist: No    Confirmed patient received a Conservation officer, historic buildings and a Surveyor, mining with first shipment. The patient will receive a drug information handout for each medication shipped and additional FDA Medication Guides as required.       DISEASE/MEDICATION-SPECIFIC INFORMATION        N/A    SPECIALTY MEDICATION ADHERENCE     Medication Adherence    Patient reported X missed doses in the last month: 0  Specialty Medication: tobramycin pf 300mg /74ml  Support network for adherence: family member              Were doses missed due to medication being on hold? No    tobramycin pf 300mg /32ml   : 7 days of medicine on hand       REFERRAL TO PHARMACIST     Referral to the pharmacist: Not needed      [facility]     Shipping address confirmed in Epic.       Delivery Scheduled: Yes, Expected medication delivery date: 5/30.     Medication will be delivered via UPS to the prescription address in Epic WAM.    Jordan [name]   [facility] Specialty Technician

## 2022-08-16 DIAGNOSIS — G4733 Obstructive sleep apnea (adult) (pediatric): Secondary | ICD-10-CM | POA: Diagnosis not present

## 2022-08-16 DIAGNOSIS — J449 Chronic obstructive pulmonary disease, unspecified: Secondary | ICD-10-CM | POA: Diagnosis not present

## 2022-08-16 DIAGNOSIS — J479 Bronchiectasis, uncomplicated: Secondary | ICD-10-CM | POA: Diagnosis not present

## 2022-08-19 MED FILL — TOBRAMYCIN 300 MG/5 ML IN 0.225 % SODIUM CHLORIDE FOR NEBULIZATION: RESPIRATORY_TRACT | 28 days supply | Qty: 280 | Fill #4

## 2022-08-19 MED FILL — SPIRIVA RESPIMAT 2.5 MCG/ACTUATION SOLUTION FOR INHALATION: RESPIRATORY_TRACT | 90 days supply | Qty: 12 | Fill #1

## 2022-08-21 NOTE — Unmapped (Signed)
Fax signed updated oxygen CMN and current clinic notes as requested for patient's oxygen

## 2022-08-27 NOTE — Unmapped (Signed)
Adult Pulmonary Specialty Clinic Pharmacist Note     Purpose: To clarify albuterol script     August 27, 2022 3:06 PM: Phone call to patient. Patient explained that he has always taken 2 vials of albuterol per dose. Denies tachycardia or feeling jittery. Will try using 1 vial of albuterol through the weekend and let me know if he notices any difference on Monday.       Total time spent: 10 minutes     Electronically signed:  Alben Spittle, PharmD, BCACP, CPP  Clinical Pharmacist Practitioner  [facility] Adult Cystic Fibrosis/Pulmonary Clinic  [phone]     CC:   Alvino Blood, RN (NCF-Bronchiectasis Nurse Coordinator)

## 2022-09-01 MED ORDER — ALBUTEROL SULFATE 2.5 MG/3 ML (0.083 %) SOLUTION FOR NEBULIZATION
Freq: Three times a day (TID) | RESPIRATORY_TRACT | 3 refills | 90 days | Status: CP
Start: 2022-09-01 — End: 2023-09-01

## 2022-09-04 MED ORDER — ALBUTEROL SULFATE 2.5 MG/3 ML (0.083 %) SOLUTION FOR NEBULIZATION
Freq: Three times a day (TID) | RESPIRATORY_TRACT | 3 refills | 90 days | Status: CP
Start: 2022-09-04 — End: 2023-09-04

## 2022-09-07 DIAGNOSIS — J479 Bronchiectasis, uncomplicated: Principal | ICD-10-CM

## 2022-09-09 MED ORDER — BD LUER-LOK SYRINGE 3 ML 21 GAUGE X 1"
5 refills | 0 days | Status: CP
Start: 2022-09-09 — End: ?
  Filled 2022-09-17: qty 60, 30d supply, fill #0

## 2022-09-09 NOTE — Unmapped (Signed)
[facility] Refill Coordination Note    Specialty Medication(s) to be Shipped:   CF/Pulmonary/Asthma: colistimethate 150 mg injection (COLYMYCIN)    Other medication(s) to be shipped:  BD luer lok, BD regular bevel, BD Posiflush, alcohol pads     Jordan [name], DOB: 06/22/1955  Phone: [phone] (home)       All above HIPAA information was verified with patient.     Was a Nurse, learning disability used for this call? No    Completed refill call assessment today to schedule patient's medication shipment from the [facility] [phone]).  All relevant notes have been reviewed.     Specialty medication(s) and dose(s) confirmed: Regimen is correct and unchanged.   Changes to medications: Jordan [name] reports no changes at this time.  Changes to insurance: No  New side effects reported not previously addressed with a pharmacist or physician: None reported  Questions for the pharmacist: No    Confirmed patient received a Conservation officer, historic buildings and a Surveyor, mining with first shipment. The patient will receive a drug information handout for each medication shipped and additional FDA Medication Guides as required.       DISEASE/MEDICATION-SPECIFIC INFORMATION        For CF patients: CF [facility] Active? Yes    SPECIALTY MEDICATION ADHERENCE     Medication Adherence    Patient reported X missed doses in the last month: 0  Specialty Medication: tobramycin (PF) 300 mg/5 mL nebulizer solution ([name])  Patient is on additional specialty medications: No  Patient is on more than two specialty medications: No  Any gaps in refill history greater than 2 weeks in the last 3 months: no  Demonstrates understanding of importance of adherence: yes  Informant: patient  Reliability of informant: reliable  Provider-estimated medication adherence level: good  Patient is at risk for Non-Adherence: No  Reasons for non-adherence: no problems identified  Support network for adherence: family member              Were doses missed due to medication being on hold? No    colistimethate 150 mg injection (COLYMYCIN)  : 0 days of medicine on hand        REFERRAL TO PHARMACIST     Referral to the pharmacist: Not needed      [facility]     Shipping address confirmed in Epic.       Delivery Scheduled: Yes, Expected medication delivery date: 09/18/22.     Medication will be delivered via UPS to the prescription address in Epic WAM.    Jordan [name]' W Jordan [name] Shared [facility] Pharm[facility] Specialty Technician

## 2022-09-16 DIAGNOSIS — J479 Bronchiectasis, uncomplicated: Secondary | ICD-10-CM | POA: Diagnosis not present

## 2022-09-16 DIAGNOSIS — J449 Chronic obstructive pulmonary disease, unspecified: Secondary | ICD-10-CM | POA: Diagnosis not present

## 2022-09-16 DIAGNOSIS — G4733 Obstructive sleep apnea (adult) (pediatric): Secondary | ICD-10-CM | POA: Diagnosis not present

## 2022-09-17 ENCOUNTER — Ambulatory Visit: Admit: 2022-09-17 | Discharge: 2022-09-18 | Payer: PRIVATE HEALTH INSURANCE

## 2022-09-17 DIAGNOSIS — M5417 Radiculopathy, lumbosacral region: Secondary | ICD-10-CM | POA: Diagnosis not present

## 2022-09-17 DIAGNOSIS — G629 Polyneuropathy, unspecified: Secondary | ICD-10-CM | POA: Diagnosis not present

## 2022-09-17 MED ORDER — GABAPENTIN 300 MG CAPSULE
ORAL_CAPSULE | Freq: Three times a day (TID) | ORAL | 3 refills | 90.00000 days | Status: CP
Start: 2022-09-17 — End: 2022-09-17

## 2022-09-17 MED ORDER — ALCOHOL SWABS
5 refills | 0 days
Start: 2022-09-17 — End: ?

## 2022-09-17 MED FILL — COLISTIN (COLISTIMETHATE SODIUM) 150 MG SOLUTION FOR INJECTION: 30 days supply | Qty: 60 | Fill #1

## 2022-09-17 MED FILL — BD POSIFLUSH NORMAL SALINE 0.9 % INJECTION SYRINGE: 30 days supply | Qty: 180 | Fill #2

## 2022-09-17 MED FILL — ALCOHOL PREP PADS: 50 days supply | Qty: 100 | Fill #0

## 2022-09-17 MED FILL — BD REGULAR BEVEL NEEDLES 21 GAUGE X 1 1/2": 30 days supply | Qty: 60 | Fill #3

## 2022-09-17 NOTE — Unmapped (Signed)
Outpatient Neurology Consult Note     MMNT [address]  [facility]OLOG[facility]IN[facility] [address] [place]  [address]  [place] [address]  [phone]    Date: 09/18/22  Patient Name: Jordan [name]   MRN: [id number]  PCP: Jordan Copas, MD    ASSESSMENT       Jordan [name] is a 67 year old man who has paresthesias in his feet in the setting of antifungal medication (voriconazole) although discontinuation of this medication has not necessarily taking away his neuropathic symptoms.  Component of superimposed lumbosacral radiculopathy cannot be excluded.  He appears stable since his last visit however.       PLAN       I recommended continuing physical therapy.  I discussed that his antifungal medication may have caused some of the symptoms although superimposed issues from the back may be playing a part and his right greater than left paresthesias.  He is not interested in further workup of his spine since he has already had several surgeries.  I encouraged him to follow-up and periodic intervals and contact me by chart in case his symptoms were to get worse.      Today, I personally spent 40 minutes with the patient, which includes all pre, intra, and post visit time on the date of service.        I discussed the he has a current medication list which includes the following prescription(s): albuterol, albuterol, ascorbic acid (vitamin c), atorvastatin, azithromycin, symbicort, calcium carbonate, coenzyme q10, colistimethate, diltiazem, dornase alfa, ferrous sulfate, fluticasone propionate, hydrocodone-acetaminophen, montelukast, multivitamin-minerals-lutein, omeprazole, oxygen-air delivery systems, spiriva respimat, zinc gluconate, zoledronic acid-mannitol&water, alcohol swabs, ciprofloxacin hcl, empty container, empty container, empty container, l. acidophilus/bifid. animalis, lc plus, nebulizers, bd regular bevel needles, sodium chloride, sodium chloride, sterile water, syringe with needle, bd luer-lok syringe, and tobramycin (pf).    I discussed possible side effects and interactions of these medications.         HISTORY     Chief Concern: Jordan [name] is a 67 y.o. man who is seen for follow-up of peripheral neuropathy.    History of Present Illness: Since him last visit, he feels that the neuropathy symptoms have worsened.  The bottom of his feet are numb.  Numbness now a sense up to halfway up to her lower leg, almost to the knee.  Sometimes at nighttime there is pain which wakes him up.  Initially, it was thought that his neuropathy may have been caused by the antifungal medication voriconazole but he discontinued this in August 2023 and his symptoms are still getting worse.  Swelling has subsided however.  He thinks that the swelling was related to mirtazapine.  He has occasional difficulties with balance that he gets up too quickly.  He has been on hydrocodone for low back issues for many years.  He has had 3 low back and 3 neck surgeries.  He is also being treated for bronchiectasis and is status post right lower lobe lobectomy.      Other than as noted above and in the chart, there is no change in the past medical, surgical, family, or social history since the last visit.    Past Medical History: He  has a past medical history of Abscess of lung (CMS-HCC) (11/06/2010), Arthritis, Biceps tendon tear (2013), Bronchiectasis (CMS-HCC), Degenerative joint disease of left acromioclavicular joint, GERD (gastroesophageal reflux disease), Hypertension, Obstructive sleep apnea on CPAP (03/14/13), Pneumonia (2012), PONV (postoperative nausea and vomiting),  Prostate cancer (CMS-HCC) (09/09/2017), Rotator cuff injury (left), and Vertigo.    Medications:   He is on the following medications: has a current medication list which includes the following prescription(s): albuterol, albuterol, ascorbic acid (vitamin c), atorvastatin, azithromycin, symbicort, calcium carbonate, coenzyme q10, colistimethate, diltiazem, dornase alfa, ferrous sulfate, fluticasone propionate, hydrocodone-acetaminophen, montelukast, multivitamin-minerals-lutein, omeprazole, oxygen-air delivery systems, spiriva respimat, zinc gluconate, zoledronic acid-mannitol&water, alcohol swabs, ciprofloxacin hcl, empty container, empty container, empty container, l. acidophilus/bifid. animalis, lc plus, nebulizers, bd regular bevel needles, sodium chloride, sodium chloride, sterile water, syringe with needle, bd luer-lok syringe, and tobramycin (pf).    Allergies: He is allergic to gentamicin, tobramycin, mirtazapine, and morphine.       EXAMINATION         Physical Examination:    Vital Signs :Vitals signs :His height is 174 cm (5' 8.5) and weight is 80.7 kg (178 lb). His blood pressure is 123/68 and his pulse is 80.       Neurological examination:    Mental Status testing is normal. Language and speech are normal.    Cranial Nerve exam:    Extra ocular muscles are full, face is strong and symmetric. Gag is intact. Tongue is strong. Neck flexion and extension are strong.    Motor examination: Muscle bulk and muscle tone are normal.    Strength evaluation:      Strength evaluation Upper Extremities (MRC/lbs.):        SA EF  EE  WE  WF  FF FE ThAb FAb   Right 5 5 5 5 5 5 5 5 5    Left 5 5 5 5 5 5 5 5 5    R lbs            L lbs                 Strength evaluation Lower Extremities (MRC):        HF KE KF DF PF Toe E Toe F   Right 5 5 5 5 5      Left 5 5 5 5 5      R- lbs          L- lbs              Reflexes:    Reflexes Right Left   Biceps 2+ 2+   Triceps 2+ 2+   Brachioradialis 2+ 2+   Patella 2+ 2+   Achilles 0 0   Plantar Response Flexor Flexor     Sensory Examination:  Pinprick sensation is normal also reduced in the bottom of his feet up to metatarsophalangeal joint.  Vibration is absent at the great toe and present at the medial malleolus (right side worse than the left).  Proprioception is normal.    Coordination and Gait:  Gait is narrow-based and steady, with normal arm swing. Tandem gait is intact. Patient is able to rise on toes and heels and is able to get up from a low chair without difficulty.     Please note that portions of this note may be dictated using Dragon natural speaking voice recognition software.  Variances in spelling and vocabulary are possible and unintentional.

## 2022-09-22 DIAGNOSIS — J471 Bronchiectasis with (acute) exacerbation: Secondary | ICD-10-CM | POA: Diagnosis not present

## 2022-09-22 DIAGNOSIS — J479 Bronchiectasis, uncomplicated: Secondary | ICD-10-CM | POA: Diagnosis not present

## 2022-09-22 DIAGNOSIS — G4733 Obstructive sleep apnea (adult) (pediatric): Secondary | ICD-10-CM | POA: Diagnosis not present

## 2022-10-01 DIAGNOSIS — J9611 Chronic respiratory failure with hypoxia: Principal | ICD-10-CM

## 2022-10-01 DIAGNOSIS — J479 Bronchiectasis, uncomplicated: Principal | ICD-10-CM

## 2022-10-05 DIAGNOSIS — J479 Bronchiectasis, uncomplicated: Principal | ICD-10-CM

## 2022-10-05 NOTE — Unmapped (Signed)
[facility] Refill Coordination Note    Specialty Medication(s) to be Shipped:   CF/Pulmonary/Asthma: tobramycin 300mg /70ml    Other medication(s) to be shipped:  [employer]OK     Jordan [name], DOB: March 06, 1956  Phone: [phone] (home)       All above HIPAA information was verified with patient.     Was a Nurse, learning disability used for this call? No    Completed refill call assessment today to schedule patient's medication shipment from the [facility] [phone]).  All relevant notes have been reviewed.     Specialty medication(s) and dose(s) confirmed: Regimen is correct and unchanged.   Changes to medications: Lawsen reports no changes at this time.  Changes to insurance: No  New side effects reported not previously addressed with a pharmacist or physician: None reported  Questions for the pharmacist: No    Confirmed patient received a Conservation officer, historic buildings and a Surveyor, mining with first shipment. The patient will receive a drug information handout for each medication shipped and additional FDA Medication Guides as required.       DISEASE/MEDICATION-SPECIFIC INFORMATION        For CF patients: CF [facility] Active? No-not enrolled    SPECIALTY MEDICATION ADHERENCE     Medication Adherence    Patient reported X missed doses in the last month: 0  Specialty Medication: tobramycin (PF) 300 mg/5 mL nebulizer solution ([name])  Patient is on additional specialty medications: No  Patient is on more than two specialty medications: No  Any gaps in refill history greater than 2 weeks in the last 3 months: no  Demonstrates understanding of importance of adherence: yes  Informant: patient  Reliability of informant: reliable  Provider-estimated medication adherence level: good  Patient is at risk for Non-Adherence: No  Reasons for non-adherence: no problems identified  Support network for adherence: family member  Confirmed plan for next specialty medication refill: delivery by pharmacy  Refills needed for supportive medications: not needed          Refill Coordination    Has the Patients' Contact Information Changed: No  Is the Shipping Address Different: No         Were doses missed due to medication being on hold? No    Tobramycin  300/5 mg/ml: 0 days of medicine on hand-pt will start 10/22/22       REFERRAL TO PHARMACIST     Referral to the pharmacist: Not needed      [facility]     Shipping address confirmed in Epic.       Delivery Scheduled: Yes, Expected medication delivery date: 07/25.     Medication will be delivered via UPS to the prescription address in Epic WAM.    Antonietta Barcelona   [facility] Specialty Technician

## 2022-10-12 DIAGNOSIS — Z85828 Personal history of other malignant neoplasm of skin: Secondary | ICD-10-CM | POA: Diagnosis not present

## 2022-10-12 DIAGNOSIS — L821 Other seborrheic keratosis: Secondary | ICD-10-CM | POA: Diagnosis not present

## 2022-10-12 DIAGNOSIS — L812 Freckles: Secondary | ICD-10-CM | POA: Diagnosis not present

## 2022-10-12 DIAGNOSIS — L853 Xerosis cutis: Secondary | ICD-10-CM | POA: Diagnosis not present

## 2022-10-12 DIAGNOSIS — D1801 Hemangioma of skin and subcutaneous tissue: Secondary | ICD-10-CM | POA: Diagnosis not present

## 2022-10-12 DIAGNOSIS — L570 Actinic keratosis: Secondary | ICD-10-CM | POA: Diagnosis not present

## 2022-10-13 MED ORDER — TRELEGY ELLIPTA 200 MCG-62.5 MCG-25 MCG POWDER FOR INHALATION
RESPIRATORY_TRACT | 3 refills | 0 days | Status: CP
Start: 2022-10-13 — End: ?

## 2022-10-14 MED FILL — TOBRAMYCIN 300 MG/5 ML IN 0.225 % SODIUM CHLORIDE FOR NEBULIZATION: RESPIRATORY_TRACT | 56 days supply | Qty: 280 | Fill #5

## 2022-10-14 MED FILL — LC PLUS MISC: 30 days supply | Qty: 1 | Fill #4

## 2022-10-16 DIAGNOSIS — J449 Chronic obstructive pulmonary disease, unspecified: Secondary | ICD-10-CM | POA: Diagnosis not present

## 2022-10-16 DIAGNOSIS — G4733 Obstructive sleep apnea (adult) (pediatric): Secondary | ICD-10-CM | POA: Diagnosis not present

## 2022-10-16 DIAGNOSIS — J479 Bronchiectasis, uncomplicated: Secondary | ICD-10-CM | POA: Diagnosis not present

## 2022-10-23 ENCOUNTER — Ambulatory Visit
Admit: 2022-10-23 | Discharge: 2022-10-24 | Payer: PRIVATE HEALTH INSURANCE | Attending: Internal Medicine | Primary: Internal Medicine

## 2022-10-23 ENCOUNTER — Ambulatory Visit: Admit: 2022-10-23 | Discharge: 2022-10-24 | Payer: PRIVATE HEALTH INSURANCE

## 2022-10-23 DIAGNOSIS — Z8546 Personal history of malignant neoplasm of prostate: Secondary | ICD-10-CM | POA: Diagnosis not present

## 2022-10-23 DIAGNOSIS — M479 Spondylosis, unspecified: Secondary | ICD-10-CM | POA: Diagnosis not present

## 2022-10-23 DIAGNOSIS — Z79899 Other long term (current) drug therapy: Secondary | ICD-10-CM | POA: Diagnosis not present

## 2022-10-23 DIAGNOSIS — Z792 Long term (current) use of antibiotics: Secondary | ICD-10-CM | POA: Diagnosis not present

## 2022-10-23 DIAGNOSIS — J4550 Severe persistent asthma, uncomplicated: Secondary | ICD-10-CM | POA: Diagnosis not present

## 2022-10-23 DIAGNOSIS — B449 Aspergillosis, unspecified: Secondary | ICD-10-CM | POA: Diagnosis not present

## 2022-10-23 DIAGNOSIS — K219 Gastro-esophageal reflux disease without esophagitis: Secondary | ICD-10-CM | POA: Diagnosis not present

## 2022-10-23 DIAGNOSIS — M5417 Radiculopathy, lumbosacral region: Secondary | ICD-10-CM | POA: Diagnosis not present

## 2022-10-23 DIAGNOSIS — Z7951 Long term (current) use of inhaled steroids: Secondary | ICD-10-CM | POA: Diagnosis not present

## 2022-10-23 DIAGNOSIS — J471 Bronchiectasis with (acute) exacerbation: Secondary | ICD-10-CM | POA: Diagnosis not present

## 2022-10-23 DIAGNOSIS — G4733 Obstructive sleep apnea (adult) (pediatric): Secondary | ICD-10-CM | POA: Diagnosis not present

## 2022-10-23 DIAGNOSIS — A498 Other bacterial infections of unspecified site: Secondary | ICD-10-CM | POA: Diagnosis not present

## 2022-10-23 DIAGNOSIS — G629 Polyneuropathy, unspecified: Secondary | ICD-10-CM | POA: Diagnosis not present

## 2022-10-23 DIAGNOSIS — J479 Bronchiectasis, uncomplicated: Secondary | ICD-10-CM | POA: Diagnosis not present

## 2022-10-23 DIAGNOSIS — Z9981 Dependence on supplemental oxygen: Secondary | ICD-10-CM | POA: Diagnosis not present

## 2022-10-23 DIAGNOSIS — I10 Essential (primary) hypertension: Secondary | ICD-10-CM | POA: Diagnosis not present

## 2022-10-23 DIAGNOSIS — M810 Age-related osteoporosis without current pathological fracture: Secondary | ICD-10-CM | POA: Diagnosis not present

## 2022-10-23 DIAGNOSIS — J9611 Chronic respiratory failure with hypoxia: Secondary | ICD-10-CM | POA: Diagnosis not present

## 2022-10-23 MED ORDER — TRELEGY ELLIPTA 200 MCG-62.5 MCG-25 MCG POWDER FOR INHALATION
RESPIRATORY_TRACT | 3 refills | 0 days | Status: CP
Start: 2022-10-23 — End: ?

## 2022-10-23 NOTE — Unmapped (Addendum)
Sent Trelegy to [facility].    Planning on admission for IV antibiotics and aggressive airway clearance.    Symptoms of a bronchiectasis exacerbation: 48 hours of at least two of the following:  Increased cough  Change in volume or appearance of sputum  Increased sputum purulence  Worsening shortness of breath and/or exercise tolerance  Fatigue and/or malaise  Coughing up blood (hemoptysis)    If you are experiencing some of these symptoms:  Increase the frequency and/or intensity of your airway clearance  Try to submit a sputum sample for bacterial and AFB cultures (at [facility] or locally)  Reach out to me or your local physician.  If you need antibiotics, recommend treating for 14 days.      Please bring any new airway clearance equipment to your next visit to ensure that you are using and caring for it properly.    Thank you for allowing me to be a part of your care. Please call the clinic with any questions.    Viona Gilmore, MD, MPH  Pulmonary and Critical Care Medicine  [address]  [id number]  [place], [place] [address]    Thank you for your visit to the [facility] Pulmonary Clinics. You may receive a survey from [facility] regarding your visit today, and we are eager to use this feedback to improve your experience. Thank you for taking the time to fill it out.    Between appointments, you can reach [place] at these numbers:  For appointments: [phone]  For my nurse, Alvino Blood: [phone]  Fax: [phone]  For urgent issues after hours: Hospital Operator @ [phone] & ask for Pulmonary Fellow on call    My [facility] Chart is for non-urgent messages. This means you have a simple medical question that does not require an immediate response.     If you need immediate attention, call 911.     Responses may take up to 3 business days. Your message will be read by your provider or another medical team member who may respond on your provider???s behalf.    Some questions cannot be answered through messages in My [facility] Chart. Depending on your question, your provider???s office may ask you to schedule an appointment.     Information sent through My [facility] Chart will become part of your medical record.    For further information, check out the websites below:    General Information:  [facility] Bronchiectasis/NTM Care and Research Center: [email or web address]  Information about bronchiectasis (BE): Speak Up In BE - Page for People With Bronchiectasis ([email or web address])   Information about Non-tuberculous Mycobacterial Infections:  https://www.ntminfo.org   Videos from the patient session of the World Bronchiectasis Conference in [place] on October 03, 2016 can be watched here (TrustyNews.es).   Online Community for Individuals with Bronchiectasis and/or NTM through the Bronchiectasis Registry and the COPD Foundation: https://www.mckee-young.org/    Airway Clearance, Exercise, and Care:  Impact Airway Clearance Education: http://www.impact-be.com  Bronchiectasis Toolbox (information about airway clearance): DiningCalendar.de  Exercise program by [facility] [place]: http://www.brown.com/  Be Clear with Bronchiectasis by Toniann Ket, MPH (health educator and patient): https://www.letsbecleartoday.com/    Research Opportunities:  Interested in our bronchiectasis and NTM clinical trials: https://go.ToyArticles.ca      Interested in other clinical trials and research opportunities?  www.clinicaltrials.gov  The Rare Diseases Clinical Research Network [facility]) Genetic Disorders of Mucociliary Clearance Consortium [facility]) Contact Registry is a way for patients with disorders of mucociliary clearance (such as bronchiectasis)  and their family members to learn about research studies they may be able to join. Participation is completely voluntary and you may choose to withdraw at any time. There is no cost to join the [employer]. For more information or to join the registry please go to the following website:  [email or web address]

## 2022-10-23 NOTE — Unmapped (Signed)
[facility]   Assessment:      Patient:Jordan [name] Resides (1955-07-02)    Jordan [name] is a 67 y.o. male who is seen for follow up of bronchiectasis with chronic Pseudomonas colonization and chronic hypoxemic respiratory failure requiring supplemental oxygen with exertion and sleep. After admission to [facility] for worsening dyspnea despite oral antibiotics, developed hemoptysis. 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  Also found to have tortuous bronchial artery supplying the RUL; s/p BAE 07/11/21. Developed peripheral neuropathy initially postulated to be due to voriconazole though failed to improve with dose reduction.  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      Bronchiectasis Severity Index Score: Calculated on 10/23/22.    Age 51-69 = 2   BMI 18.5 or higher = 0   FEV1 % predicted 30-49% = 2   Hospitalized for severe exacerbation in past 2 years Yes = 5   Exacerbations in previous year 0-2 = 0   mMRC dyspnea score 3 = I stop for breath after walking about 100 yards or after a few minutes on level ground.   Chronic Pseudomonas colonization (at least 2 positive cultures at least 3 months apart within 1 year) Yes = 3   Colonization with other potential pathogenic bacteria (at least 2 positive cultures at least 3 months apart within 1 year) * No = 0   Radiologic Severity 3 or more lobes or cystic brx = 1       Total Score 13     BSI score of 9+ = Severe bronchiectasis (1 year outcomes 7.6 - 10.5% mortality, 16.7 - 52.6% hospitalization rate; 4 year outcomes 9.9 - 29.2% mortality, 41.2 - 80.4% hospitalization)     * NTM is not included.    [name] et al. The Bronchiectasis Severity Index: An International Derivation and Validation Study. AJRCCM, 2013; 189(5): [phone].)      Plan:      Problem List Items Addressed This Visit          Respiratory    Bronchiectasis (CMS-HCC) - Primary     Therapies:  Patient did not need to meet with the respiratory therapist today.  The following changes were made to the inhaled medications: We ordered Trelegy via [facility] shared [facility] center.   We discussed the anticipated results from the phase 3 trial of brensocatib and given his continued exacerbation frequency.    Testing:  Patient spontaneously expectorated sputum that was sent for bacterial and AFB cultures. These results will help to guide antibiotic therapy in the future during exacerbations.   If he has future exacerbations, sputum should be sent for bacterial and AFB cultures. The bacterial culture should be processed like a cystic fibrosis sample given the overlapping pathogens. There are standing orders for these cultures in our system.  No labs or imaging needed at this time.    Referrals:  Screening colonoscopy here at [facility] per his preference.    Vaccinations:  COVID-19: Plans to receive elsewhere .  Flu: Up to date  Pneumococcal: Up to date  Tdap: Up to date  RSV: Up to date    Taavi will return to clinic in 3 months for follow-up with pre-bronchodilator spirometry and sputum cultures.  He will call or send me a message via MyChart if questions or concerns arise before this visit. Deone is in agreement with the above plan.     Subjective:      HPI: Mr.  [name] is a 67 y.o. male who is seen for follow up of bronchiectasis with PsA colonization and prior history of multiple lung resections.    09/27/18:  I last saw him on 05/17/18. He reached out at the end of May reporting symptoms c/w an exacerbation. I treated him with a two week course of Ciprofloxacin, which seemed to help.  Reports that he feels winded too much - if walks up incline or up a couple hundred feet. This has worsened over the past year. Has to stop and let lungs catch up.  Of note, he has been off testosterone supplementation for the past year after he was diagnosed with prostate cancer.    He received the [facility], which he is using with nebulized HTS 7% and Pulmozyme. After using the Vest for 6-8 weeks, he noticed improvement with more phlegm mobilization. Sometimes has to pause it to cough to get stuff up.  Plugging feeling happening a lot more, especially with the vest.  Most of the time, sputum is thinned out with Pulmozyme but still gets those plugs.    Had to stop eating ice cream or drinking milkshake - will cough will eating and that makes him vomit on occasion. Past few weeks, when eating will cough, no matter what he eats.  Happens almost once a day. Doesn't feel like things get stuck typically.      02/07/19:  Following his last visit, [place] reached out regarding portable oxygen.  We had considered the life 2000 but he does not qualify because he does not use supplemental oxygen.  He performed the 6-minute walk test prior to today's visit but did not desaturate.  He wonders if this is because the test was done in the morning and he doesn't have breathing issues in the morning.  Typically, breathing issues occur in afternoon.  During these episodes, he will have to use his inhaler and feels like he is suffocating.  He describes it as an uncomfortable feeling that limits his ability to do things with family.  If he is having trouble catching breath, oximetry is 92-93%. Has seen it in the 80s if comes in from outside and is short of breath.    For airway clearance, he is nebulizing 10% hypertonic saline and using his percussive vest for 20 minute sessions in AM and PM. Has to pause it to cough and get stuff up.  Coughing up more stuff since last visit and is waking up 1-2 times a night with cough.having coughing fits between clearance session.  What he expectorates is less of a mucous plug and mostly stringy sputum that is yellowish to light brown in color.  No hemoptysis.  On Saturday, he had a coughing fit that nearly triggered posttussive emesis.  He only expectorated about 3-4 globs of phlegm.  Nebulizing Pulmozyme daily.  Alternates [name] and colistin every other month.  On [name] this month and will start Colistin on Dec 1.    06/06/19:  Coughing more and more sputum.  Waking up 1-2 times per night coughing. Can cough once or just keep coughing.  Sputum is yellow in color without evidence of blood.  He is not expectorating green sputum currently. On colistin this month; alternates with [name]. Went through January off all inhaled antibiotics.  Oxygen levels in the low 90s.  Uses rescue inhaler if goes up stairs.  Regularly performing airway clearance with nebulized 10% hypertonic saline twice a day, percussive vest twice a day, and Pulmozyme daily. He  last received IV antibiotics in January 2019 but has been treated with oral antibiotics on 04/19/18 and 08/17/18, 03/10/19.      11/10/19:  1-2 times per week can get sharp right sided chest pain - even with nipple line. Can happen with clearance or activity or rest. Never during sleep. Following his hospitalization, he first experienced it in June. Lasts only about 10 seconds. Not reproducible. Limited activity due to dyspnea. Regularly performing airway clearance with nebulized 10% hypertonic saline twice a day, percussive vest twice a day, and Pulmozyme daily. Remains on chronic azithromycin therapy.    02/13/20:  Showed up to [facility] with oxygen saturations of 87-88% after walking from parking lot (<100 yd) and up incline without supplemental oxygen.  Using 2L continuous flow with exertion and sleep.  Had sleep study done at [facility] on RA and was told that he should be fine with just the supplemental oxygen. I do not have this report. Using oxygen when goes to football game; needs long period to recover from activity.  Going up 20 foot incline with oxygen, will have to stop and catch breath.     Wheezing more lately and endorses chest tightness. Has need to use albuterol as rescue 3-4 times per day.  Used albuterol in the car on the way here. Typically would be able to cough up large amount of mucus after PFTs but only coughed up small amount. Feels like mucus is stuck in lungs despite twice daily airway clearance with 10% HTS and Vest. Also using Pulmozyme once a day and Symbicort twice a day after clearance.  Using colistin this month. He ordered the [name] nebs to use next month even though insurance won't cover it. Feels like inhaled antibiotics definitely help.    05/10/20:   Saw [facility] for lung transplant evaluation.  Felt to be poor candidate due to prior surgeries and primary disease.  Candidacy closed at [facility]. Referral sent to [facility] and [facility] on 2/15.  Based on labs and testing done there, getting Hep B vaccination series (not Hep A or Hep B immune).  Also getting DEXA that was ordered by Dr. Abigail Miyamoto.    Had PSG done at [facility] - no evidence of OSA but does have increased upper airway resistance.  He never sleeps on his back; only sleeps on side or stomach.   Fatigue is most burdensome symptom.    Regrets that he let the surgeon operate on his lung before he met me.  He completed pulmonary rehab at [facility] but they only had him on machine for ~30 minutes and then stretching.  No strength exercises. He is planning on joining a gym based on recommendations from [facility] transplant.  Was surprised that he didn't desaturate during his walk test at [facility]    07/02/20:  At last visit, referred him to [facility] for pulmonary rehab. Found to have osteoporosis in hips. Turned down for transplant at [facility] based on chart review.  No change in breathing or cough.  Every once in a while, has bad coughing spells.  Same sputum volume and color. Globby to stringy. Not dark brown or bloody.  Sputum is light brown to yellowish.  Breathing about the same.  Started on 1200 mg calcium for osteoporosis.  Starting telehealth pulmonary rehab - feels optimistic that it will help him do more.    10/31/20:  Heart monitor for pulm rehab - had 40 events of fast rate or afib.  Has  felt heart fluttering.  Did event monitor for 8 days. Getting echocardiogram on Wednesday and then seeing cardiologist the next week.     Was having sharp pains over right abdomen below ribs.  Once a month, now once every 2 weeks.    03/18/21:  Since his last clinic visit, he was evaluated by cardiology at [facility] (October) and then was admitted to [facility] earlier this month due to hemoptysis that occurred while on ciprofloxacin for an exacerbation.  He also had worsening shortness of breath.  Was discharged home on 21 days of meropenem.  Did not undergo embolization or bronchoscopy.     Hasn't felt good since August.  No energy, lost weight. End Nov, asked for Cipro and for few days felt better. Then didn't feel better and was ordeal to see PCP.  WBC elevated in ER - they called me. Friday night/Saturday started seeing dark blood in sputum.  Monday night 8PM, got breathing treatment with bright red globs.  Large volume blood and phlegm. They were going to do bronchoscopy but then bleeding improved with change in abx, holding 10% saline. Was continued on percussive vest and Pulmozyme. Friday to Saturday, wine colored.  Then pinkish. Sunday night could barely see blood and then gone Monday.    Finished Meropenem last night (3 weeks total). Christmas eve morning, started seeing bright red streaks. Yesterday, coughed up bright red blood that was 50/50 with phlegm.  Today more wine colored.   Shortness of breath better with meropenem but no change in hemoptysis.    07/01/21:  After last visit, we attempted to perform bronchoscopy but due to hypoxemia, we canceled the procedure.  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  He also had a visit with Dr. Erven Colla in interventional radiology and is scheduled for bronchial artery embolization later this month.    Feels off balance but not dizzy/spinning.  This has been intensifying with increasing the voriconazole dose. Happening daily every time he stands up. No hearing changes. Head feels heavy.  No vision changes nor double visions. Sinuses up and down, bleed a little bit.  Some post-nasal drip.     10/14/21:  At last visit, pursued [employer] for his work of breathing. Started mirtazapine for appetite, mood, and sleep. Underwent BAE on 07/11/21. Tapering off voriconazole due to development of peripheral neuropathy and lower extremity swelling.  Had been on voriconazole for about 5 months.    Swelling started slightly in April and has progressed. Neuropathy started in tips of toes a few weeks ago but now at ankles.  More shortness of breath when laying flat.  Has woken up at night short of breath.  Work of breathing outside is better on L2K.    11/21/21:  At last visit, reporting ascending peripheral neuropathy and more rapidly progressive lower extremity edema. Planned to continue voriconazole 100 mg daily and repeat chest CT to see if any evolution of his mycetoma. No lab evidence that he is absorbing his inhaled antibiotics.    Since last visit, swelling seems to have increased.  Lasix seemed to help for 2 days but then no difference aside from no longer waking up night to urinate.  Neuropathy to the ankles bilaterally.  Feeling more swollen in legs and even abdomen feels     04/14/22:  At last visit, stopped voriconazole, ordered CT abd/pelvis, referred to endocrinology. Since then, he saw speech therapy  for swallowing exercises.  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 Additional left-sided lumbosacral radiculopathy (L5-S1) of chronic nature is also noted.    Since that visit, reached out reporting symptoms of exacerbation. He dropped a sputum at [facility] for culture but there is nothing in their system showing a sample was received/process.  Prescribed Cipro but didn't feel like he got as much improvement from it as usual.  Feeling washed out. Neg COVID tests.  No fever.     08/04/22:  At last visit, wasn't feeling 100% after oral/inhaled antibiotics and we discussed potentially treating with IV antibiotics as outpatient. Having periods of time when coughing spells worsen and then improve again.  Did treat him with Augmentin in early February.  Has been having difficulty with L2K unit at home.      Waiting for L2K to work - 2 months. Got new regulator and then stopped working.  Spoke with someone there and since it works on Clinical research associate, told its the regulator.  [employer] is sending someone from [place] to prove that its the regulator.  If oxygen briefly in home with activity, drops to mid-80s.    10/23/22:  At last visit, made no changes to his airway clearance regimen nor medications; placed referral for colonoscopy to be done at [facility]. Since then, he feels he has had more frequent spells of chest tightness, wheezing, and difficulty expectorating sputum. For example, he had a hard time with this around the time of evening nebulizer treatment a week ago and last night. He also noted desaturation with O2 off of his nose with 20-50 feet ambulation inside his house. It slowly went back to low 90s once settled in and putting nasal cannula back. He denies hemoptysis or fever, chills around this episode.     Respiratory Symptoms:   Cough: Since last visit, having bad spells a little more frequently- about a week ago and last night. White sputum  Nocturnal awakenings: 1-2 times per month  Wheezing: absent.  Chest tightness: absent.  Rescue albuterol use: 2-3 times in past week but better after coughing up stuff.  Pleurisy: sporadic once every other day over prior resection. It's been about every 2 weeks currently as of 10/23/22  Hemoptysis: Nothing since embolization.  MMRC Stage: 3 = I stop for breath after walking about 100 yards or after a few minutes on level ground. L2K's battery issues have been resolved and has been using it.     Exacerbations:   Number of exacerbations treated in the past year: 2  Dates of exacerbations: 03/2018, 07/2018, 02/2019, 09/2019, 12/2019, 10/2020, 01/2021, 07/2021, 03/2022, 04/2022  Number of hospitalizations for exacerbations in the past 2 years: 0  Dates of hospitalizations: 01/2015, 03/2015, 01/2016, 03/2017, 09/2019, 10/2020, 02/2021    Pulmonary Therapies:   Airway clearance: Pulmozyme daily, HTS 10% bid, percussive vest bid.   Inhaled antibiotics: Alternates [name] and Colistin monthly - on Colistin in odd number of months and [name] in even number of months.   Inhalers: Symbicort 160/4.5, Spiriva Respimat. Currently running out of Symbicort for the past one week. We've sent Trelegy to [facility] while he works on paper work via [employer].  Chronic antibiotics: azithromycin 250 mg daily.   Exercise: Was walking 3 miles at 2.1 without stopping on the treadmill.  Pulmonary Rehab: Telehealth pulmonary rehab July 2022  Supplemental oxygen: 2L continuous at rest and with sleep. Increased to 4-5L with activity  NIPPV: No  ROS: No F/C/NS. Weight stablized and less edema now that off the mirtazipime.    Past Medical History:   Diagnosis Date    Abscess of lung (CMS-HCC) 11/06/2010    CT Chest 10/22/10 12/05/2010 right thoracotomy resection of right middle lobe and resection of right lower lobe abscess     Arthritis     back    Biceps tendon tear 2013    left side    Bronchiectasis (CMS-HCC)     chronic psuedomonas infection    Degenerative joint disease of left acromioclavicular joint     GERD (gastroesophageal reflux disease)     Hypertension     Obstructive sleep apnea on CPAP 03/14/13    AHI 33.5, on BiPAP 12/8 based on sleep study 09/2013    Pneumonia 2012    Lung abcess     PONV (postoperative nausea and vomiting)     Prostate cancer (CMS-HCC) 09/09/2017    Rotator cuff injury left    Vertigo        Past Surgical History:   Procedure Laterality Date    APPENDECTOMY  1969    BACK SURGERY      BRONCHOSCOPY  10/19/2010    [facility]    CARPAL TUNNEL RELEASE  1999    IR EMBOLIZATION HEMORRHAGE ART OR VEN LYMPHATIC EXTRAVASATION  07/11/2021    IR EMBOLIZATION HEMORRHAGE ART OR VEN  LYMPHATIC EXTRAVASATION 07/11/2021 Braulio Conte, MD IMG VIR [facility]    LUNG REMOVAL, PARTIAL Right 10/2010    RML and RLL partially resected    MENISCECTOMY  2011    NECK SURGERY  1993, 1999, 2002    PR GERD TST W/ MUCOS IMPEDE ELECTROD,>1HR N/A 07/06/2012    Procedure: ESOPHAGEAL FUNCTION TEST, GASTROESOPHAGEAL REFLUX TEST W/ NASAL CATHETER INTRALUMINAL IMPEDANCE ELECTRODE(S) PLACEMENT, RECORDING, ANALYSIS AND INTERPRETATION; PROLONGED;  Surgeon: None None;  Location: [facility];  Service: Gastroenterology    PR OPEN TREAT RIB FRACTURE W/INT FIXATION, UNILATERAL, 1-2 RIBS Right 03/19/2015    Procedure: OPEN TREATMENT OF RIB FRACTURE REQUIRING INTERNAL FIXATION, UNILATERAL; 1-2 RIBS;  Surgeon: Evert Kohl, MD;  Location: MAIN OR [facility];  Service: Cardiothoracic    PR THORACOTOMY W/THERAP WEDGE RESEXN ADDL IPSILATRL Right 08/07/2013    Procedure: THORACOTOMY; WITH THERAPEUTIC LOBECTOMY OF RIGHT MIDDLE AND RIGHT LOWER LOBE RESECTION , EACH ADDITIONAL RESECTION, IPSILATERAL;  Surgeon: Alvester Chou, MD;  Location: MAIN OR [place];  Service: Cardiothoracic    TONSILLECTOMY  1965       Family History   Problem Relation Age of Onset    Alzheimer's disease Mother     Stroke Mother     Bronchiectasis  Brother         presumed    Bronchiectasis  Brother         presumed    Liver disease Father     Diabetes Father     Heart failure Father     Bronchiectasis  Daughter     Asthma Son         childhood asthma    Clotting disorder Neg Hx     Anesthesia problems Neg Hx        Social History     Tobacco Use    Smoking status: Never    Smokeless tobacco: Never   Substance Use Topics    Alcohol use: No     Alcohol/week: 0.0 standard drinks of alcohol    Drug use: No  Allergies  Reviewed on 10/23/2022        Reactions Comments    Gentamicin Other (See Comments) BALANCE ISSUES    Tobramycin Other (See Comments) ototoxicity    Mirtazapine Other (See Comments) Lower extremity swelling    Morphine Nausea And Vomiting           Current Outpatient Medications   Medication Sig Dispense Refill    albuterol 2.5 mg /3 mL (0.083 %) nebulizer solution Inhale 3 mL by nebulization Three (3) times a day. 810 mL 3    alcohol swabs (ALCOHOL PREP PADS) PadM Use as directed with inhaled antibiotics 100 each 5    ascorbic acid, vitamin C, (VITAMIN C) 1000 MG tablet Take 1 tablet (1,000 mg total) by mouth daily.      atorvastatin (LIPITOR) 10 MG tablet Take 1 tablet (10 mg total) by mouth daily.      azithromycin (ZITHROMAX) 250 MG tablet TAKE 1 TABLET BY MOUTH EVERY DAY 90 tablet 3    calcium carbonate (CALCIUM 600 ORAL) Take 2 tablets by mouth two (2) times a day. 1 in morning and 1 in the evening      coenzyme Q10 300 mg cap Take 10-300 mg by mouth daily.      colistimethate (COLYMYCIN) 150 mg injection Inject 2 mL sterile water for injection to mix colistin vial, then draw up 2 mL (150mg ) and inhale 2 times a day, 28 days on and 28 days off. 60 each 5    dilTIAZem (CARDIZEM CD) 120 MG 24 hr capsule Take 1 capsule (120 mg total) by mouth daily.      empty container (SHARPS CONTAINER) Misc USE AS DIRECTED 1 each 0    empty container Misc USE AS DIRECTED 1 each 2    empty container Misc Use as directed to dispose of needles. When full, make sure lid is closed tightly then dispose of container in trash. 1 each 2    ferrous sulfate 325 (65 FE) MG tablet Take 1 tablet (325 mg total) by mouth Two (2) times a day. BID      fluticasone propionate (FLONASE) 50 mcg/actuation nasal spray 1 spray into each nostril daily.      HYDROcodone-acetaminophen (NORCO) 7.5-325 mg per tablet Take 1 tablet by mouth three (3) times a day (at 6am, noon and 6pm).      montelukast (SINGULAIR) 10 mg tablet TAKE 1 TABLET BY MOUTH EVERY DAY AT NIGHT 90 tablet 3    multivitamin-minerals-lutein Tab Take 1 tablet by mouth daily.      nebulizers (LC PLUS) Misc Use with inhaled medications 1 each 5    nebulizers Misc use with nebulized medications 12 each 0    needle, disp, 21 G (BD REGULAR BEVEL NEEDLES) 21 gauge x 1 1/2 Ndle Use as directed with inhaled Colistin 100 each 3    omeprazole (PRILOSEC) 20 MG capsule Take 1 capsule (20 mg total) by mouth Two (2) times a day.      OXYGEN-AIR DELIVERY SYSTEMS MISC Inhale 2 mL.      sodium chloride (BD POSIFLUSH NORMAL SALINE 0.9) 0.9 % injection Inject 2mL of 0.9%NaCl into colistin vial & gently mix. After withdrawing colistin dose, add an additional 1mL of 0.9%NaCl to neb cup with the colistin dose. 180 mL 11    sodium chloride 10 % Nebu Measure and Inhale 5 mL by nebulization Two (2) times a day. Discard remaining amount and use a new vial for each dose. 900 mL 3  sterile water Soln Use 2mL to mix Colistin, then add additional 2mL to neb cup with 2mL of mixed colistin. Inhale twice daily 28 days on and 28 days off. 600 mL 5    syringe with needle (BD LUER-LOK SYRINGE) 3 mL 20 gauge x 1 1/2 Syrg For use with inhaled antibiotic (Colistin) 60 each 30    syringe with needle (BD LUER-LOK SYRINGE) 3 mL 21 gauge x 1 Syrg For use with inhaled antibiotic (Colistin) 60 each 5    tobramycin, PF, ([name]) 300 mg/5 mL nebulizer solution Inhale the contents of 1 ampule (300 mg total) by nebulization every twelve (12) hours. 28 days on and 28 days off. 280 mL 5    zinc gluconate 50 mg (7 mg elemental zinc) tablet Take 1 tablet (50 mg total) by mouth.      zoledronic acid-mannitol&water (RECLAST) 5 mg/100 mL PgBk       albuterol HFA 90 mcg/actuation inhaler Inhale 2 puffs Four (4) times a day. (Patient taking differently: Inhale 2 puffs four (4) times a day as needed.) 25.5 g 3    dornase alfa (PULMOZYME) 1 mg/mL nebulizer solution Inhale 1 ampule (2.5 mg) daily. Use at least 30-60 minutes before airway clearance, or after airway clearance 225 mL 3    fluticasone-umeclidin-vilanter (TRELEGY ELLIPTA) 200-62.5-25 mcg DsDv Inhale 1 puff  in the morning. (Patient not taking: Reported on 10/23/2022) 180 each 3     No current facility-administered medications for this visit.     Physical Exam:   BP 125/69 (BP Site: L Arm, BP Position: Sitting, BP Cuff Size: Small)  - Pulse 87  - Wt 79.8 kg (176 lb)  - SpO2 94%  - BMI 26.37 kg/m??   Fatigued appearing white male in no acute distress. Easy work of breathing without accessory muscle use. Coarse crackles throughout lung fields.  Diminished breath founds over RLL. No expiratory wheezes. Speaking in full sentences.  Regular rate and rhythm with normal S1 and S2.  No murmurs, rubs, or gallops.  Clubbing unchanged from prior.    Diagnostic Review:   The following data were reviewed during this visit with key findings summarized below:  Pulmonary Function Testing:      Spirometry consistent with severe airway obstruction and suggestive of moderate restriction.  Measures are stable from prior but below those from about 18 months ago. Normal inspiratory loop.    FVC (% predicted) FEV1 (% predicted) FEV1/FVC   11/10/2013 3.19 L (68%) 2.16 L (61%)  68%   02/23/2014 3.50 L (76%)  2.23 L (64%)  64%   07/12/2014  2.94 L (66%)  2.02 L (58%)  69%   01/15/2015  2.82 L (62%)  1.79 L (52%)  63%   02/08/2015  3.04 L (66%)  2.06 L (59%)  68%   04/02/2015  2.86 L (63%)  1.83 L (53%)  64%   12/10/2015  2.77 L (62%)  1.62 L (48%)  58%   01/27/2016  2.42 L (54%)  1.55 L (46%)  64%   03/24/2016  3.36 L (75%)  1.99 L (59%)  59%   07/21/2016  2.56 L (57%)  1.54 L (46%)  60%   12/29/2016  2.80 L (63%)  1.57 L (47%)  56%   04/06/2017  2.61 L (59%)  1.46 L (44%)  56%   07/06/2017  2.56 L (58%)  1.49 L (45%)  58%   10/12/2017  2.63 L (59%)  1.47 L (44%)  56%  01/18/2018  2.68 L (63%)  1.36 L (42%)  51%   05/17/2018 2.49 L (57%)  1.25 L (38%)  50%   09/26/2018 2.43 L (57%)  1.26 L (39%)  52%   01/26/2019 2.16 L (51%)  1.16 L (36%)  54%   06/06/2019 2.47 L (59%) 1.11 L (34%) 45%   11/10/19 2.14 L (50%) 1.20 L (37%) 56%   02/13/20 2.01 L (48%) 1.08 L (34%) 54% 05/02/20 2.29 L (54%) 1.11 L (34%) 48%   07/02/20 (pre) 2.11 L (51%) 1.17 L (37%) 56%   07/02/20 (post) 2.26 L (54%) [+7.2%] 1.22 L (38%) [+4.0%] 54%   10/31/20 2.46 L (59%) 1.17 L (37%) 48%   07/01/21 2.33 L (56%) 1.13 L (36%) 48%   09/29/21 1.92 L (47%) 1.01 L (32%) 53%   11/21/21 2.10 L (51%) 1.03 L (33%) 49%   04/14/22 2.09 L (54%) 1.05 L (35%) 50%   08/04/22 1.98 L (51%) 1.00 L (34%) 50%   10/23/22 2.07 L (533%) 0.95 L (32%) 46%     6-minute Walk (12/06/19): Heart Rate: Resting Heart Rate was 71 bpm. Maximum Heart Rate achieved 97 bpm. Patient did reach target heart rate (85-132 bpm).  Heart rate returned to 77 bpm after 2 minutes of recovery.  Oxygen Saturation: Resting O2 Saturation was 94% on RA. O2 saturation nadir was 87% at minute 2. O2 saturation improved to 96% with 2L via [place] at minute 3 and maintained saturations above 88% for remainder of walk. O2 saturation returned to 96% on room air after 2 minutes of recovery.  Distance: Total 6 minute walk distance was 320 m. Predicted 6 minute walk distance 548.9 m (LLN of 395.8 m). Percent of predicted 6 minute walk distance of 58%. Testing included one 5-minute pause. Distance obtained after placed on 2L and walked for an additional 6 minutes.  BORG: Resting BORG score was 1. Maximum BORG score was 5 at minute 2. Concluding BORG score was 0 after 2 minutes of recovery. Impression: Significant exercise induced oxygen desaturation (below 88%). Exertional hypoxemia improved with 2L supplemental oxygen. Walk distance is less than expected suggesting a cardio-respiratory defect -- clinical correlation suggested.  Recommendation: Supplemental oxygen required with exercise at 2L [place]. Consider further evaluation with spirometry, lung volumes, respiratory muscle flow/force measurements, cardiopulmonary stress testing, and/or cardio/vascular studies.    6-Minute Walk (02/15/19): Resting Heart Rate was 91. Maximum Heart Rate achieved 119. Patient did reach target heart rate (86-133). Resting O2 Saturation was 98% on RA. Resting BORG score was 1. O2 saturation nadir was 91% at minute 6. Total 6 minute walk distance was 381 meters. Predicted 6 minute walk distance of 558 m (LLN of 425m). Percent of predicted 6 minute walk distance of 68%. Heart rate returned to 96 bpm after 2 minutes of recovery. O2 saturation returned to 96% on RA after 2 minutes of recovery. Maximum BORG score was 7 at minute 6. Concluding BORG score was 2 after 2 minutes of recovery. No evidence of resting or exercise induced hypoxemia. No supplemental oxygen is required at this time.    6-Minute Walk (01/26/19): Resting Heart Rate was 86. Maximum Heart Rate achieved 97. Patient did reach target heart rate (86-133). Resting O2 Saturation was 99% on RA. Resting BORG score was 1. O2 saturation nadir was 95% at minutes 3 and 6. Total 6 minute walk distance was 350 meters. Predicted 6 minute walk distance of 558 m (LLN of 468m). Percent of predicted 6 minute walk distance  of 63%. Heart rate returned to 86 bpm after 2 minutes of recovery. O2 saturation returned to 96% on RA after 2 /minutes of recovery. Maximum BORG score was 6 at minutes 5 and 6. Concluding BORG score was 1 after 2 minutes of recovery. No evidence of resting or exercise induced hypoxemia. No supplemental oxygen is required at this time.    Cultures:    Source Bacterial Culture AFB Smear AFB Culture   04/02/15 Sputum 2+ OPF; 1+ Probably mPsA Negative Negative   07/02/15 Sputum 4+ OPF; 3+ mPsA; 3+ sPsA; 1+ Steno Negative Negative   01/23/16 Sputum 2+ OPF; 1+ mPsA; 1+ sPsA - -   03/24/16 Sputum 3+ OPF; 3+ mPsA; 3+ sPsA Negative Overgrown   07/21/16 Sputum 4+ OPF; 4+ mPsA; 2+ sPsA Negative Negative   12/29/16 Sputum 4+ OPF; 3+ mPsA; 3+ sPsA Negative Actinomadura sp   04/06/17 Sputum 3+ OPF; 4+ mPsA Negative Negative   07/06/17 Sputum 4+ OPF; 3+ mPsA Negative Negative   10/12/17 Sputum 4+ OPF; 3+ mPsA Negative Negative   01/18/18 Sputum OPF Negative Negative 05/17/18 Sputum 4+ OPF; 3+ mPsA - -   09/26/18 Sputum 4+ OPF; 3+ mPsA Negative Negative   01/26/19 Sputum 4+ OPF; 2+ mPsA Negative Negative   02/15/19 Sputum 3+ OPF; 3+ mPsA Negative Negative   06/06/19 Sputum 3+ OPF; 3+ sPsA; 3+ mPsA Negative Negative   11/10/19 Sputum 2+ OPF; 3+ mPsA Negative Negative   02/13/20 Sputum OPF Negative Negative   07/02/20 Sputum 3+ OPF; 1+sPsA; 3+ mPsA negative negative   10/31/20 Sputum 4+ OPF; 2+ sPsA; 4+ mPsA negative negative   11/17/20 Sputum 3+ OPF; 3+ mPsA negative negative   02/21/21 Sputum Few PsA negative negative   02/27/21 Sputum Scant C albicans and light A fumigatus - -   03/18/21 Sputum 2+ OPF; 1+ mPsA negative negative   03/26/21 Sputum 4+ OPF; 3+ mPsA negative negative   09/29/21 Sputum 4+ OPF; 4+ sPsA; 4+ mPsA negative negative   04/14/22 Sputum 4+ OPF; 1+ mPsA negative negative   08/04/22 Sputum OPF - -      Aspergillus Ag BAL/serum (04/02/21): 0.55 (@ [facility])    Bronchiectasis Evaluation   IgG with subclasses:   Lab Results   Component Value Date/Time    IGGT 996 04/28/2012 06:21 AM    IGG1 402 04/28/2012 06:21 AM    IGG2 399 04/28/2012 06:21 AM    IGG3 148.0 (H) 04/28/2012 06:21 AM    IGG4 47.7 04/28/2012 06:21 AM     IgA (09/21/11): 283   IgM (09/21/11): 52   IgE: (02/13/20): 16.6  Lab Results   Component Value Date/Time    IGE 16.6 02/13/2020 10:24 AM     Specific Titers: No results found for: TETANUSAB, TETGV, DIPHTERIAAB, DIPGV, S. pneumonia titers  HIV (05/02/20): Non-reactive  Autoimmune (05/02/20): ANA negative   ANCA: No results found for: ANCA, IFA, PR3ELISA, PR3QT, MPO, MPOQT  CBC-D (05/02/20): 6.1>14.8/44.3<156; ANC 4.1; ALC 1.2; AEC 0.17  Lab Results   Component Value Date/Time    WBC 4.1 11/20/2020 04:03 AM    WBC 6.1 11/19/2020 03:39 AM    WBC 7.7 11/18/2020 03:29 AM    WBC 7.9 08/13/2013 03:57 AM    WBC 9.4 08/12/2013 04:21 AM    WBC 11.7 (H) 08/11/2013 04:04 AM    HGB 11.8 (L) 11/20/2020 04:03 AM    HGB 14.8 03/24/2015 05:31 PM    HCT 34.5 (L) 11/20/2020 04:03 AM    HCT 30.8 (L) 08/13/2013  03:57 AM    PLT 134 (L) 11/20/2020 04:03 AM    PLT 292 08/13/2013 03:57 AM    NEUTROABS 7.8 11/17/2020 02:31 PM    NEUTROABS 3.7 02/13/2020 10:24 AM    NEUTROABS 3.2 04/19/2017 05:45 AM    NEUTROABS 4.9 06/21/2013 09:31 PM    NEUTROABS 4.6 06/21/2013 08:13 PM    NEUTROABS 4.1 04/26/2012 11:54 PM    LYMPHSABS 1.4 11/17/2020 02:31 PM    LYMPHSABS 1.4 02/13/2020 10:24 AM    LYMPHSABS 1.4 (L) 04/19/2017 05:45 AM    LYMPHSABS 1.9 06/21/2013 09:31 PM    LYMPHSABS 1.8 06/21/2013 08:13 PM    LYMPHSABS 2.8 04/26/2012 11:54 PM    EOSABS 0.2 11/17/2020 02:31 PM    EOSABS 0.2 02/13/2020 10:24 AM    EOSABS 0.2 04/19/2017 05:45 AM    EOSABS 0.2 06/21/2013 09:31 PM    EOSABS 0.2 06/21/2013 08:13 PM    EOSABS 0.3 04/26/2012 11:54 PM    BASOSABS 0.0 11/17/2020 02:31 PM    BASOSABS 0.0 06/21/2013 09:31 PM     Alpha-1 level: No results found for: A1TRYP  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      IgE (05/23/18):         Imaging  Chest CT (06/15/22): Images personally reviewed.  Minimal change from prior CT though there is a slightly smaller focus of soft tissue/consolidation, possibly reflective of mycetoma, within complex cavitary lesion in the apex of the right lung.  New small focus of endobronchial debris within the cystic airspaces of the lingula.    Chest CT (11/14/21): Images personally reviewed. I agree with radiology interpretation that he continues to have extensive cavitary consolidation in right apex with areas of dependent soft tissue within medial component of cavity; findings unchanged and c/w mycetoma. Ill-defined nodular consolidation in right posterior lung. S/p resection of right lower lobe. Unchanged extensive bronchiectasis. Mosaic attenuation c/w gas trapping 2/2 small airways disease.     CTA Chest (04/15/21): Images personally reviewed. Sequelae of right middle lobectomy and right lower lobe wedge resection. Chronic consolidation in the medial posterior right upper lobe and right lung apex with volume loss and architectural distortion of the right upper lobe. Compensatory hyperexpansion of the left lung with mosaic attenuation consistent with areas of air trapping. Extensive chronic bilateral bronchiectasis and bronchial thickening. Unchanged diffuse bilateral tree-in-bud nodularity.  Mildly enlarged and tortuous right bronchial artery, predominantly supplying the right upper lung, arising from the lateral descending aorta at the level of T6 (7:58, 4:38). No enlarged left-sided bronchial arteries. No CT evidence of intrapulmonary arteriovenous malformation.     Chest CT (02/25/21): Images personally reviewed.  Stable sequelae of right middle lobectomy and right lower lobe wedge resection. 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 Stable bilateral diffuse extensive bronchiectasis, bronchial wall thickening, and sequelae of chronic endobronchial infection.        CXR (05/02/20): Images not available for review. Per report, Visualized cardiac and mediastinal contours within normal limits allowing for mediastinal shift to the right. Circumferential right  pleural fluid/thickening. Volume loss in the right lung consistent with known right lung resection. Irregular opacities throughout both lungs, right greater than left, consistent with known bronchiectasis.     Chest CT (11/10/19): Images personally reviewed. Stable sequelae of right middle lobectomy and right lower lobe wedge resection. Stable consolidation in the medial posterior right upper lobe and right lung apex, bilateral diffuse extensive bronchiectasis and bronchial wall thickening and unchanged sequelae of chronic endobronchial infection.    Chest CT (02/15/19): Images personally reviewed. Stable sequelae of right middle lobectomy and right lower lobe wedge resection.  Unchanged diffuse extensive bronchiectasis and bronchial wall thickening bilaterally.  Diffusely scattered tree-in-bud nodules in bilateral lung parenchyma with interval increase in tree-in-bud nodules in left lung; the constellation of these findings are consistent with sequelae of worsening chronic endobronchial infection.    Chest CT (03/31/16): Images personally reviewed. Status post right middle and right lower lobectomies. Tiny loculated basilar right hydropneumothorax, probably chronic. Moderate cylindrical and varicoid bronchiectasis throughout both lungs, with associated scattered mucoid impaction, tree-in-bud opacities and scattered peribronchovascular and subpleural consolidation in the right upper lobe. 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 Two-vessel coronary atherosclerosis. Nonobstructing right nephrolithiasis.    Modified Barium Swallow (01/17/19): Shallow laryngeal penetration observed with thin liquids and puree.  No aspiration identified with any of the tested consistencies.    Immunization History   Administered Date(s) Administered    COVID-19 VAC,BIVALENT(56YR UP),PFIZER 12/17/2020    COVID-19 VAC,MRNA,TRIS(12Y UP)(PFIZER)(GRAY CAP) 12/01/2019, 05/03/2020, 08/13/2022    COVID-19 VACC,MRNA,(PFIZER)(PF) 04/20/2019, 05/13/2019, 05/18/2019, 12/01/2019    Covid-19 Vac, (4yr+) (Comirnaty) [employer]  01/07/2022, 08/13/2022    HEPATITIS B VACCINE ADULT,IM(ENERGIX B, RECOMBIVAX) 05/13/2020, 06/11/2020, 11/13/2020    Hepatitis A (Adult) 05/13/2020, 11/13/2020    INFLUENZA QUAD ADJUVANTED 57YR UP(FLUAD) 11/25/2021    INFLUENZA TIV (TRI) 81MO+ W/ PRESERV (IM) 11/13/2010, 01/07/2012, 12/09/2017, 11/30/2019, 12/17/2020    INFLUENZA TIV (TRI) PF (IM) 02/20/2012, 12/09/2012    Influenza Vaccine Quad(IM)6 MO-Adult(PF) 01/31/2016, 12/09/2017    Influenza Virus Vaccine, unspecified formulation 12/25/2014, 12/01/2016    Influenza Whole 12/21/2008    PNEUMOCOCCAL POLYSACCHARIDE 23-VALENT 10/22/2010, 02/07/2016, 10/07/2019    Pneumococcal Conjugate 13-Valent 04/28/2013    RSV VACCINE,ADJUVANTED(PF)(AREXVY) 01/07/2022    TdaP 03/22/2012, 07/23/2021    ZOSTAVAX - ZOSTER VACCINE, LIVE, SQ 07/03/2014, 06/11/2017, 01/19/2018     Portions of this record have been created using Dragon dictation software. Dictation errors have been sought, but may not have been identified and corrected.

## 2022-10-30 NOTE — Unmapped (Signed)
Addended by: Viona Gilmore on: 10/29/2022 05:36 PM     Modules accepted: Level of Service

## 2022-10-30 NOTE — Unmapped (Signed)
Admission Date Requested: 11/07/2022    Reason for Admission: Bronchiectasis exacerbation in setting of chronic hypoxemic respiratory failure and PsA colonization.    Antibiotics:  Based on prior culture data (OPF, PsA) and allergy profile, favor treating with Zosyn IV, Cipro 750 mg oral, and [name] nebs.  Will need single lumen PICC line for 2-3 weeks of IV antibiotics. Duration to be determined based on clinical response and improvement in inflammatory markers.  Continue chronic azithromycin for anti-inflammatory effect.    Other medications:  Trelegy to be converted to Duonebs (pre-treatment before hypertonic saline), budesonide 0.5 mg bid, and arformoterol 15 mcg bid.    Airway clearance: Needs aggressive airway clearance 4 times per day.  Nebulized hypertonic saline 10%; pre-treatment with albuterol/ipratropium.  For mechanical clearance, prefer Metaneb and Vibralung.  Continue daily Pulmozyme    Consults:  Pulmonology  PICC    Labs:  On admission, CBC-D, CMP, CRP, ESR, sputum for bacterial and AFB cultures (NTM screening). If patient is unable to spontaneously expectorate a sample, please induce with hypertonic saline.   Weekly CRP and ESR to trend (including as outpatient).  [facility] Guideline for Monitoring Outpatient Intravenous and Oral Antimicrobial Therapy recommended monitoring labs ([email or web address]    Imaging:  Defer to admitting provider.    Other Testing:  Patient does not need PFTs performed while in the hospital.  EKG after a few days of Cipro and azithromycin    [facility]):  Patient is not a candidate for [facility] due to distance.    Dispo:   Patient has a follow-up appointment with me scheduled for 01/26/23 already.  Patient does not need to remain in the hospital for the entire course of IV antibiotics.   Please reach out to [facility] Bronchiectasis/NTM Care and [facility] team (myself, Alvino Blood RN, Eliberto Ivory CPP) prior to discharge to ensure that home infusion orders are correct and that we are comfortable with patient discharging home on IVs at that time.  We will follow monitoring labs.  Please use [email or web address] for home infusion orders. We will follow monitoring labs. Does not need OPAT.  Do not discharge Friday through Sunday or on a holiday as no outpatient clinical staff available to assist if problems arise with home health/home infusion.     I am available to assist the primary team with his care during hospitalization.

## 2022-11-02 DIAGNOSIS — J479 Bronchiectasis, uncomplicated: Principal | ICD-10-CM

## 2022-11-07 ENCOUNTER — Ambulatory Visit: Admit: 2022-11-07 | Payer: PRIVATE HEALTH INSURANCE

## 2022-11-07 ENCOUNTER — Ambulatory Visit
Admit: 2022-11-07 | Discharge: 2022-11-17 | Disposition: A | Discharge status: Home / Self Care | Payer: PRIVATE HEALTH INSURANCE | Admitting: Internal Medicine

## 2022-11-07 DIAGNOSIS — Z902 Acquired absence of lung [part of]: Secondary | ICD-10-CM | POA: Diagnosis not present

## 2022-11-07 DIAGNOSIS — Z885 Allergy status to narcotic agent status: Secondary | ICD-10-CM | POA: Diagnosis not present

## 2022-11-07 DIAGNOSIS — Z79899 Other long term (current) drug therapy: Secondary | ICD-10-CM | POA: Diagnosis not present

## 2022-11-07 DIAGNOSIS — J9612 Chronic respiratory failure with hypercapnia: Secondary | ICD-10-CM | POA: Diagnosis not present

## 2022-11-07 DIAGNOSIS — J9621 Acute and chronic respiratory failure with hypoxia: Secondary | ICD-10-CM | POA: Diagnosis not present

## 2022-11-07 DIAGNOSIS — J479 Bronchiectasis, uncomplicated: Secondary | ICD-10-CM | POA: Diagnosis not present

## 2022-11-07 DIAGNOSIS — Z792 Long term (current) use of antibiotics: Secondary | ICD-10-CM | POA: Diagnosis not present

## 2022-11-07 DIAGNOSIS — I10 Essential (primary) hypertension: Secondary | ICD-10-CM | POA: Diagnosis not present

## 2022-11-07 DIAGNOSIS — J9811 Atelectasis: Secondary | ICD-10-CM | POA: Diagnosis not present

## 2022-11-07 DIAGNOSIS — I77819 Aortic ectasia, unspecified site: Secondary | ICD-10-CM | POA: Diagnosis not present

## 2022-11-07 DIAGNOSIS — Z9981 Dependence on supplemental oxygen: Secondary | ICD-10-CM | POA: Diagnosis not present

## 2022-11-07 DIAGNOSIS — Z8546 Personal history of malignant neoplasm of prostate: Secondary | ICD-10-CM | POA: Diagnosis not present

## 2022-11-07 DIAGNOSIS — A498 Other bacterial infections of unspecified site: Secondary | ICD-10-CM | POA: Diagnosis not present

## 2022-11-07 DIAGNOSIS — Z7983 Long term (current) use of bisphosphonates: Secondary | ICD-10-CM | POA: Diagnosis not present

## 2022-11-07 DIAGNOSIS — J9601 Acute respiratory failure with hypoxia: Secondary | ICD-10-CM | POA: Diagnosis not present

## 2022-11-07 DIAGNOSIS — Z888 Allergy status to other drugs, medicaments and biological substances status: Secondary | ICD-10-CM | POA: Diagnosis not present

## 2022-11-07 DIAGNOSIS — J471 Bronchiectasis with (acute) exacerbation: Secondary | ICD-10-CM | POA: Diagnosis not present

## 2022-11-07 DIAGNOSIS — Z881 Allergy status to other antibiotic agents status: Secondary | ICD-10-CM | POA: Diagnosis not present

## 2022-11-07 DIAGNOSIS — J449 Chronic obstructive pulmonary disease, unspecified: Secondary | ICD-10-CM | POA: Diagnosis not present

## 2022-11-07 DIAGNOSIS — Z825 Family history of asthma and other chronic lower respiratory diseases: Secondary | ICD-10-CM | POA: Diagnosis not present

## 2022-11-07 DIAGNOSIS — K5900 Constipation, unspecified: Secondary | ICD-10-CM | POA: Diagnosis not present

## 2022-11-07 DIAGNOSIS — J9611 Chronic respiratory failure with hypoxia: Secondary | ICD-10-CM | POA: Diagnosis not present

## 2022-11-07 DIAGNOSIS — B965 Pseudomonas (aeruginosa) (mallei) (pseudomallei) as the cause of diseases classified elsewhere: Secondary | ICD-10-CM | POA: Diagnosis not present

## 2022-11-07 DIAGNOSIS — G4733 Obstructive sleep apnea (adult) (pediatric): Secondary | ICD-10-CM | POA: Diagnosis not present

## 2022-11-07 DIAGNOSIS — G629 Polyneuropathy, unspecified: Secondary | ICD-10-CM | POA: Diagnosis not present

## 2022-11-07 DIAGNOSIS — Z833 Family history of diabetes mellitus: Secondary | ICD-10-CM | POA: Diagnosis not present

## 2022-11-07 DIAGNOSIS — Z823 Family history of stroke: Secondary | ICD-10-CM | POA: Diagnosis not present

## 2022-11-07 DIAGNOSIS — J984 Other disorders of lung: Secondary | ICD-10-CM | POA: Diagnosis not present

## 2022-11-07 DIAGNOSIS — K219 Gastro-esophageal reflux disease without esophagitis: Secondary | ICD-10-CM | POA: Diagnosis not present

## 2022-11-07 DIAGNOSIS — J9622 Acute and chronic respiratory failure with hypercapnia: Secondary | ICD-10-CM | POA: Diagnosis not present

## 2022-11-07 DIAGNOSIS — Z79891 Long term (current) use of opiate analgesic: Secondary | ICD-10-CM | POA: Diagnosis not present

## 2022-11-07 LAB — CBC W/ AUTO DIFF
BASOPHILS ABSOLUTE COUNT: 0.1 10*9/L (ref 0.0–0.1)
BASOPHILS RELATIVE PERCENT: 1.2 %
EOSINOPHILS ABSOLUTE COUNT: 0.5 10*9/L (ref 0.0–0.5)
EOSINOPHILS RELATIVE PERCENT: 6.6 %
HEMATOCRIT: 39.6 % (ref 39.0–48.0)
HEMOGLOBIN: 13.4 g/dL (ref 12.9–16.5)
LYMPHOCYTES ABSOLUTE COUNT: 1.7 10*9/L (ref 1.1–3.6)
LYMPHOCYTES RELATIVE PERCENT: 23.7 %
MEAN CORPUSCULAR HEMOGLOBIN CONC: 33.7 g/dL (ref 32.0–36.0)
MEAN CORPUSCULAR HEMOGLOBIN: 31.2 pg (ref 25.9–32.4)
MEAN CORPUSCULAR VOLUME: 92.5 fL (ref 77.6–95.7)
MEAN PLATELET VOLUME: 8.5 fL (ref 6.8–10.7)
MONOCYTES ABSOLUTE COUNT: 0.7 10*9/L (ref 0.3–0.8)
MONOCYTES RELATIVE PERCENT: 10.1 %
NEUTROPHILS ABSOLUTE COUNT: 4.1 10*9/L (ref 1.8–7.8)
NEUTROPHILS RELATIVE PERCENT: 58.4 %
NUCLEATED RED BLOOD CELLS: 0 /100{WBCs} (ref <=4)
PLATELET COUNT: 195 10*9/L (ref 150–450)
RED BLOOD CELL COUNT: 4.28 10*12/L (ref 4.26–5.60)
RED CELL DISTRIBUTION WIDTH: 12.5 % (ref 12.2–15.2)
WBC ADJUSTED: 7.1 10*9/L (ref 3.6–11.2)

## 2022-11-07 LAB — COMPREHENSIVE METABOLIC PANEL
ALBUMIN: 3.4 g/dL (ref 3.4–5.0)
ALKALINE PHOSPHATASE: 84 U/L (ref 46–116)
ALT (SGPT): 12 U/L (ref 10–49)
ANION GAP: 5 mmol/L (ref 5–14)
AST (SGOT): 13 U/L (ref <=34)
BILIRUBIN TOTAL: 0.4 mg/dL (ref 0.3–1.2)
BLOOD UREA NITROGEN: 20 mg/dL (ref 9–23)
BUN / CREAT RATIO: 23
CALCIUM: 8.9 mg/dL (ref 8.7–10.4)
CHLORIDE: 107 mmol/L (ref 98–107)
CO2: 29 mmol/L (ref 20.0–31.0)
CREATININE: 0.88 mg/dL
EGFR CKD-EPI (2021) MALE: >90 mL/min/{1.73_m2} (ref >=60)
GLUCOSE RANDOM: 97 mg/dL (ref 70–179)
POTASSIUM: 4.2 mmol/L (ref 3.5–5.1)
PROTEIN TOTAL: 6.8 g/dL (ref 5.7–8.2)
SODIUM: 141 mmol/L (ref 135–145)

## 2022-11-07 LAB — SEDIMENTATION RATE: ERYTHROCYTE SEDIMENTATION RATE: 52 mm/h — ABNORMAL HIGH (ref 0–20)

## 2022-11-07 LAB — C-REACTIVE PROTEIN: C-REACTIVE PROTEIN: 12 mg/L — ABNORMAL HIGH (ref <=10.0)

## 2022-11-07 MED ADMIN — HYDROcodone-acetaminophen (NORCO) 5-325 mg per tablet 1.5 tablet: 1.5 | ORAL | @ 18:00:00 | Stop: 2022-11-21

## 2022-11-07 MED ADMIN — ciprofloxacin HCl (CIPRO) tablet 750 mg: 750 mg | ORAL | @ 22:00:00 | Stop: 2022-11-14

## 2022-11-07 MED ADMIN — fluticasone propionate (FLONASE) 50 mcg/actuation nasal spray 1 spray: 1 | NASAL | @ 17:00:00

## 2022-11-07 MED ADMIN — piperacillin-tazobactam (ZOSYN) IVPB (premix) 4.5 g: 4.5 g | INTRAVENOUS | @ 17:00:00 | Stop: 2022-11-14

## 2022-11-07 MED ADMIN — sodium chloride 10 % NEBULIZER solution 5 mL: 5 mL | RESPIRATORY_TRACT | @ 18:00:00

## 2022-11-07 MED ADMIN — sodium chloride 10 % NEBULIZER solution 5 mL: 5 mL | RESPIRATORY_TRACT | @ 21:00:00

## 2022-11-07 MED ADMIN — ipratropium-albuterol (DUO-NEB) 0.5-2.5 mg/3 mL nebulizer solution 3 mL: 3 mL | RESPIRATORY_TRACT | @ 18:00:00

## 2022-11-07 MED ADMIN — tobramycin (PF) (TOBI) 300 mg/5 mL nebulizer solution 300 mg: 300 mg | RESPIRATORY_TRACT | @ 18:00:00 | Stop: 2022-11-14

## 2022-11-07 MED ADMIN — multivitamin with folic acid 400 mcg tablet 1 tablet: 1 | ORAL | @ 17:00:00

## 2022-11-07 MED ADMIN — ipratropium-albuterol (DUO-NEB) 0.5-2.5 mg/3 mL nebulizer solution 3 mL: 3 mL | RESPIRATORY_TRACT | @ 21:00:00

## 2022-11-07 MED ADMIN — piperacillin-tazobactam (ZOSYN) IVPB (premix) 4.5 g: 4.5 g | INTRAVENOUS | @ 21:00:00 | Stop: 2022-11-14

## 2022-11-07 MED ADMIN — pantoprazole (Protonix) EC tablet 20 mg: 20 mg | ORAL | @ 17:00:00

## 2022-11-07 NOTE — Unmapped (Signed)
[facility] Medicine   History and Physical       Assessment and Plan     Jordan [name] is a 67 y.o. male who is presenting to [facility] with Acute exacerbation of bronchiectasis (CMS-HCC), in the setting of the following pertinent/contributing co-morbidities: chronic hypoxemic respiratory failure, hypertension.      Acute exacerbation of bronchiectasis (CMS-HCC)   Acute on chronic hypoxemic respiratory failure  Chronic illness with severe exacerbation or progression that has a significant risk of morbidity without appropriate treatment. Follows with Dr. Gerri Spore [facility]) for bronchiectasis complicated by chronic MDR pseudomonas colonization. On 2L O2 via [place] at rest and exertion. Increasing dyspnea and production of purulent sputum in the last month. Denies fevers, chills, N/V. Of note, has a history of aspergilloma in RUL and tortuous bronchial artery s/p BAE 4/23. Developed peripheral neuropathy possibly 2/2 voriconazole. Stable/improved off treatment. See note from Dr. Garner Nash 10/29/22 for admission treatment recommendations.   - Consult Pulmonology for ongoing management while admitted  - Start Zosyn IV, Ciprofloxacin 750mg  po BID, Tobramycin nebs  - Continue home Azithromycin daily for anti-inflammatory affect  - Home Singulair nightly   - Hold home trelegy (hasn't been able to start this due to insurance/financial issues)  - Start Duonebs QID (pre-treatment for hypertonic saline)  - Budesonide 0.5mg  BID  - Arformoterol BID  - Airway clearance QID (RT)   - Hypertonic saline 10% nebs   - mechanical clearance: Metaneb and Vibralung   - Daily Pulmozyme  - Will need PICC line for anticipated 2-3 weeks of abx; ordered  - CRP and ESR weekly; continue on discharge  - Lower respiratory culture  - AFB culture (NTM screening)  - CXR to establish baseline  - EKG on hospital day 3 for QT monitoring on Cipro and Azithro  - Continuous O2 monitoring      Secondary/Additional Active Problems:    Essential hypertension - Aortic Root Dilation   BP well controlled.  - home diltiazem      Constipation  - Requesting colace. Monitor for Bms and intensify if needed    GERD  - home protonix      Prophylaxis  -None, low risk    Diet  -Nutrition Therapy Regular/House    Code Status / HCDM   -Full Code, Discussed with patient at the time of admission   -  HCDM (HCPOA): [name], [name] - [phone]    Anticipated Medically Ready for Discharge: Anticipated in 5+ Days    Significant Comorbid Conditions:         Issues Impacting Complexity of Management:  -Intensive monitoring of drug toxicity from Zosyn with scheduled BMP    Medical Decision Making: Reviewed records from the following unique sources: pulmonology note, prior clinic notes.    I personally spent greater than 55 minutes face-to-face and non-face-to-face in the care of this patient, which includes all pre, intra, and post visit time on the date of service.  All documented time was specific to the E/M visit and does not include any procedures that may have been performed.    HPI      Jordan [name] is a 67 y.o. male who is presenting to [facility] with Acute exacerbation of bronchiectasis (CMS-HCC).    Patient presented as a direct admit for bronchiectasis exacerbation.  Follows with Dr. Garner Nash with [facility] pulmonology.  Reporting increased dyspnea on exertion and worsening hypoxemia with increased purulent sputum production over the last month.  Typically wears 2  L nasal cannula at rest and with exertion.  Will occasionally take his oxygen off for several minutes while ambulating in the kitchen, noting his O2 sat has intermittently been in the low 80s during this time with symptomatic dyspnea.  Denies fevers, chills, nausea/vomiting.  Feels otherwise well.  Reports good medication adherence.       Med Rec Confidence   I reviewed the Medication List. The current list is Accurate    Physical Exam   Temp:  [36.3 ??C (97.3 ??F)] 36.3 ??C (97.3 ??F)  Heart Rate:  [77-82] 82  Resp:  [17-18] 18  BP: (123)/(71) 123/71  SpO2:  [91 %-93 %] 92 %  Body mass index is 25.89 kg/m??.  GEN:  NAD, sitting on the edge of bed  EYES: EOMI  ENT: MMM  CV: RRR, no m/r/g  PULM: crackles in bilateral bases, L>R. End expiratory wheezes in bilateral mid lung fields.   ABD: soft, NT/ND, +BS  EXT: trace edema in bilateral ankles  NEURO: No focal deficits  PSYCH: A+Ox3, appropriate  GU: Co CVA tenderness   MSK: No spinal tenderness          ___________________________________________________________________    Medications     Prior to Admission medications    Medication Dose, Route, Frequency   albuterol 2.5 mg /3 mL (0.083 %) nebulizer solution 3 mL, Nebulization, 3 times a day (standard)   alcohol swabs (ALCOHOL PREP PADS) PadM Use as directed with inhaled antibiotics   ascorbic acid, vitamin C, (VITAMIN C) 1000 MG tablet 1,000 mg, Oral, Daily (standard)   atorvastatin (LIPITOR) 10 MG tablet 10 mg, Oral, Daily (standard)   azithromycin (ZITHROMAX) 250 MG tablet TAKE 1 TABLET BY MOUTH EVERY DAY   calcium carbonate (CALCIUM 600 ORAL) 2 tablets, Oral, 2 times a day, 1 in morning and 1 in the evening   colistimethate (COLYMYCIN) 150 mg injection Inject 2 mL sterile water for injection to mix colistin vial, then draw up 2 mL (150mg ) and inhale 2 times a day, 28 days on and 28 days off.   dilTIAZem (CARDIZEM CD) 120 MG 24 hr capsule 1 capsule, Oral, Daily (standard)   empty container (SHARPS CONTAINER) Misc USE AS DIRECTED   empty container Misc USE AS DIRECTED   empty container Misc Use as directed to dispose of needles. When full, make sure lid is closed tightly then dispose of container in trash.   ferrous sulfate 325 (65 FE) MG tablet 325 mg, Oral, 2 times a day, BID   fluticasone propionate (FLONASE) 50 mcg/actuation nasal spray 1 spray, Nasal, Daily (standard)   HYDROcodone-acetaminophen (NORCO) 7.5-325 mg per tablet 1 tablet, Oral, 3 times a day   montelukast (SINGULAIR) 10 mg tablet TAKE 1 TABLET BY MOUTH EVERY DAY AT NIGHT multivitamin-minerals-lutein Tab 1 tablet, Oral, Daily (standard)   nebulizers (LC PLUS) Misc Use with inhaled medications   nebulizers Misc use with nebulized medications   needle, disp, 21 G (BD REGULAR BEVEL NEEDLES) 21 gauge x 1 1/2 Ndle Use as directed with inhaled Colistin   omeprazole (PRILOSEC) 20 MG capsule 20 mg, Oral, 2 times a day   tobramycin, PF, ([name]) 300 mg/5 mL nebulizer solution Inhale the contents of 1 ampule (300 mg total) by nebulization every twelve (12) hours. 28 days on and 28 days off.   zinc gluconate 50 mg (7 mg elemental zinc) tablet 50 mg, Oral   zoledronic acid-mannitol&water (RECLAST) 5 mg/100 mL PgBk    albuterol HFA 90 mcg/actuation inhaler 2  puffs, Inhalation, 4 times a day  Patient taking differently: Inhale 2 puffs four (4) times a day as needed.   coenzyme Q10 300 mg cap 10-300 mg, Daily (standard)  Patient not taking: Reported on 11/07/2022   dornase alfa (PULMOZYME) 1 mg/mL nebulizer solution Inhale 1 ampule (2.5 mg) daily. Use at least 30-60 minutes before airway clearance, or after airway clearance   fluticasone-umeclidin-vilanter (TRELEGY ELLIPTA) 200-62.5-25 mcg DsDv 1 puff, Inhalation, Daily  Patient not taking: Reported on 11/07/2022   OXYGEN-AIR DELIVERY SYSTEMS MISC 2 mL, Inhalation   sodium chloride (BD POSIFLUSH NORMAL SALINE 0.9) 0.9 % injection Inject 2mL of 0.9%NaCl into colistin vial & gently mix. After withdrawing colistin dose, add an additional 1mL of 0.9%NaCl to neb cup with the colistin dose.   sodium chloride 10 % Nebu Measure and Inhale 5 mL by nebulization Two (2) times a day. Discard remaining amount and use a new vial for each dose.   sterile water Soln Use 2mL to mix Colistin, then add additional 2mL to neb cup with 2mL of mixed colistin. Inhale twice daily 28 days on and 28 days off.   syringe with needle (BD LUER-LOK SYRINGE) 3 mL 20 gauge x 1 1/2 Syrg For use with inhaled antibiotic (Colistin)   syringe with needle (BD LUER-LOK SYRINGE) 3 mL 21 gauge x 1 Syrg For use with inhaled antibiotic (Colistin)       Allergies   Gentamicin, Tobramycin, Mirtazapine, and Morphine     Medical History     Past Medical History:   Diagnosis Date    Abscess of lung (CMS-HCC) 11/06/2010    CT Chest 10/22/10 12/05/2010 right thoracotomy resection of right middle lobe and resection of right lower lobe abscess     Arthritis     back    Biceps tendon tear 2013    left side    Bronchiectasis (CMS-HCC)     chronic psuedomonas infection    Degenerative joint disease of left acromioclavicular joint     GERD (gastroesophageal reflux disease)     Hypertension     Obstructive sleep apnea on CPAP 03/14/13    AHI 33.5, on BiPAP 12/8 based on sleep study 09/2013    Pneumonia 2012    Lung abcess     PONV (postoperative nausea and vomiting)     Prostate cancer (CMS-HCC) 09/09/2017    Rotator cuff injury left    Vertigo        Social History   Tobacco use:   reports that he has never smoked. He has never used smokeless tobacco.  Alcohol use:   reports no history of alcohol use.  Drug use:  reports no history of drug use.    Family History     Family History   Problem Relation Age of Onset    Alzheimer's disease Mother     Stroke Mother     Bronchiectasis  Brother         presumed    Bronchiectasis  Brother         presumed    Liver disease Father     Diabetes Father     Heart failure Father     Bronchiectasis  Daughter     Asthma Son         childhood asthma    Clotting disorder Neg Hx     Anesthesia problems Neg Hx        Surgical History     Past Surgical History:   Procedure  Laterality Date    APPENDECTOMY  1969    BACK SURGERY      BRONCHOSCOPY  10/19/2010    [facility]    CARPAL TUNNEL RELEASE  1999    IR EMBOLIZATION HEMORRHAGE ART OR VEN  LYMPHATIC EXTRAVASATION  07/11/2021    IR EMBOLIZATION HEMORRHAGE ART OR VEN  LYMPHATIC EXTRAVASATION 07/11/2021 Braulio Conte, MD IMG VIR H&V [facility]    LUNG REMOVAL, PARTIAL Right 10/2010    RML and RLL partially resected    MENISCECTOMY 2011    NECK SURGERY  1993, 1999, 2002    PR GERD TST W/ MUCOS IMPEDE ELECTROD,>1HR N/A 07/06/2012    Procedure: ESOPHAGEAL FUNCTION TEST, GASTROESOPHAGEAL REFLUX TEST W/ NASAL CATHETER INTRALUMINAL IMPEDANCE ELECTRODE(S) PLACEMENT, RECORDING, ANALYSIS AND INTERPRETATION; PROLONGED;  Surgeon: None None;  [facility];  Service: Gastroenterology    PR OPEN TREAT RIB FRACTURE W/INT FIXATION, UNILATERAL, 1-2 RIBS Right 03/19/2015    Procedure: OPEN TREATMENT OF RIB FRACTURE REQUIRING INTERNAL FIXATION, UNILATERAL; 1-2 RIBS;  Surgeon: Evert Kohl, MD;  Location: MAIN OR [facility];  Service: Cardiothoracic    PR THORACOTOMY W/THERAP WEDGE RESEXN ADDL IPSILATRL Right 08/07/2013    Procedure: THORACOTOMY; WITH THERAPEUTIC LOBECTOMY OF RIGHT MIDDLE AND RIGHT LOWER LOBE RESECTION , EACH ADDITIONAL RESECTION, IPSILATERAL;  Surgeon: Alvester Chou, MD;  Location: MAIN OR [facility];  Service: Cardiothoracic    TONSILLECTOMY  [phone]

## 2022-11-07 NOTE — Unmapped (Signed)
General Pulmonary Team Initial Consult Note     Date of Service: 11/07/2022  Requesting Physician: Hubbard Robinson, MD   Requesting Service: Med Undesignated (MDX)  Reason for consultation: Comprehensive evaluation of  bronchiectasis exacerbation in the setting of chronic hypoxemic respiratory failure and PsA colonization .    Hospital Problems:  Principal Problem:    Acute exacerbation of bronchiectasis (CMS-HCC)  Active Problems:    Essential hypertension    Chronic respiratory failure with hypoxia (CMS-HCC)      HPI: Jordan [name] is a 67 y.o. male with bronchiectasis with chronic hypoxemic respiratory failure, Pseudomonas colonization, and prior history of lung resections.  I saw him in clinic on 10/23/2022 when he reported more frequent spells of chest tightness, wheezing, and difficulty expectorating sputum.  Based on this, we agreed to schedule an admission for IV antibiotics and aggressive airway clearance using alternative modalities to those that he uses at home.  No interval changes since I saw him in clinic aside from [place] and [place].  He had a good time.    Problems addressed during this consult include bronchiectasis exacerbation in the setting of chronic hypoxemic respiratory failure and Pseudomonas colonization. Based on these problems, the patient has moderate risk of morbidity/mortality which is commensurate w their risk of management options described below in the recommendations.    Assessment      Interval Events: None       Impression: The patient has made no clinical progress since last encounter. I personally reviewed most recent pertinent labs, imaging and micro data, from 11/07/2022, which are noted in recommendations..        Recommendations   Antibiotics:  Based on prior culture data (OPF, PsA) and allergy profile, favor treating with Zosyn IV, Cipro 750 mg oral, and [name] nebs.  Will need single lumen PICC line for 2-3 weeks of IV antibiotics. Duration to be determined based on clinical response and improvement in inflammatory markers.  Continue chronic azithromycin for anti-inflammatory effect.     Other medications:  Trelegy converted to Duonebs (pre-treatment before hypertonic saline), budesonide 0.5 mg bid, and arformoterol 15 mcg bid.     Airway clearance: Needs aggressive airway clearance 4 times per day.  Nebulized hypertonic saline 10%; pre-treatment with albuterol/ipratropium.  For mechanical clearance, prefer Metaneb and Vibralung.  Continue daily Pulmozyme     Labs:  Admission labs done. CBC-D notable for elevated absolute eosinophils to 0.5. CMP WNL.  CRP elevated to 12.0 and ESR elevated to 52.   Sputum for bacterial and AFB cultures (NTM screening) collected.  Weekly CRP and ESR to trend (including as outpatient).  [facility] Guideline for Monitoring Outpatient Intravenous and Oral Antimicrobial Therapy recommended monitoring labs ([email or web address]     Imaging:  CXR as noted below.     Other Testing:  Patient does not need PFTs performed while in the hospital.  EKG after a few days of Cipro and azithromycin     [facility]):  Patient is not a candidate for [facility] due to distance.     Dispo:   Patient has a follow-up appointment with me scheduled for 01/26/23.  Patient does not need to remain in the hospital for the entire course of IV antibiotics but I would prefer that he spend around a week in the hospital to intensify his airway clearance using alternative devices.   Please reach out to [facility] Bronchiectasis/NTM Care and [facility] team (  myself, Alvino Blood RN, Eliberto Ivory CPP) prior to discharge to ensure that home infusion orders are correct and that we are comfortable with patient discharging home on IVs at that time.  We will follow monitoring labs.  Please use DOTPULMIVDISCHARGE for home infusion orders. We will follow monitoring labs. Does not need OPAT.  Do not discharge Friday through Sunday or on a holiday as no outpatient clinical staff available to assist if problems arise with home health/home infusion.     The recommendations outlined in this note were discussed w the primary team direct (in-person). Please do not hesitate to page [phone] [facility] consult) with questions. We appreciate the opportunity to assist in the care of this patient. We look forward to following with you.    Attestation      I directly provided consultative services as documented in this note and I personally spent 65 minutes face-to-face and non face-to-face in the care of this patient, which includes all pre, intra and post visit time on the date of service including examining the patient, evaluating/interpreting objective clinical data, coordinating care with the entire multidisciplinary care team and developing a comprehensive management plan as outlined above. All documented time was specific to the E/M visit and does not include any procedures that may have been performed.      Viona Gilmore, MD    Subjective & Objective     Vitals - past 24 hours  Temp:  [36.3 ??C (97.3 ??F)] 36.3 ??C (97.3 ??F)  Heart Rate:  [77] 77  Resp:  [17] 17  BP: (123)/(71) 123/71  SpO2:  [91 %] 91 % Intake/Output  No intake/output data recorded.      Pertinent exam findings:   General appearance - well appearing, well nourished, and in no distress  Eyes - EOMI, PERRLA, anicteric sclerae, pink conjunctiva  Mouth - moist mucous membranes, no pharyngeal erythema or exudates  Neck - Trachea midline, normal neck movement  Lymphatics - not examined  Heart - regular rate and rhythm, normal S1/S2, no gallops, rubs, or murmurs  Chest -absent breath sounds over the right middle and lower lobe, corresponding with prior resection. Inspiratory and expiratory crackles over left mid and lower lung fields with extension to the mid axillary line.  Easy work of breathing without accessory muscle use.  Speaking in full sentences without difficulty.  Abdomen - soft, nontender, nondistended, no masses or organomegaly  not examined  Extremities -trace pitting lower extremity edema extending about 2 fingerbreadths above the ankles.  Clubbing of fingers.  No cyanosis.  Skin - normal coloration and turgor, no rashes, no suspicious skin lesions noted  Neurological - alert, oriented, normal speech, no focal findings or movement disorder noted    Relevant Imaging:  - CXR on 11/07/2022 revealed stable architectural distortion, volume loss, and bronchiectasis in the right upper lobe as well as stable extensive bronchiectasis and bronchial wall thickening of the left lung.    Arterial Blood Gas:   No results for input(s): SPECTYPEART, PHART, PCO2ART, PO2ART, HCO3ART, BEART, O2SATART in the last 24 hours.     Venous Blood Gas:   No results for input(s): PHVEN, PCO2VEN, PO2VEN, HCO3VEN, BEVEN, O2SATVEN in the last 24 hours.     Cultures:  Lower Respiratory Culture (no units)   Date Value   10/23/2022 2+ Mucoid Pseudomonas aeruginosa (A)   10/23/2022 4+ Oropharyngeal Flora Isolated     No results found for: WBC       Other Labs:  Lab Results   Component Value Date    WBC 4.1 11/20/2020    HGB 11.8 (L) 11/20/2020    HCT 34.5 (L) 11/20/2020    PLT 134 (L) 11/20/2020     Lab Results   Component Value Date    NA 140 12/22/2021    K 4.0 12/22/2021    CL 105 12/22/2021    CO2 29.0 12/22/2021    BUN 18 12/22/2021    CREATININE 0.93 12/22/2021    GLU 88 12/22/2021    CALCIUM 8.9 12/22/2021    MG 1.5 (L) 12/22/2021    PHOS 3.7 01/22/2016     Lab Results   Component Value Date    BILITOT 0.3 10/14/2021    BILIDIR 0.40 04/02/2015    PROT 7.4 10/14/2021    PROT 7.1 10/14/2021    ALBUMIN 3.7 10/14/2021    ALBUMIN 4.0 10/14/2021    ALT 12 10/14/2021 AST 20 10/14/2021    ALKPHOS 94 10/14/2021    GGT 64 06/21/2013     Lab Results   Component Value Date    LABPROT 10.4 08/03/2013    INR 0.95 04/14/2017    APTT 32.6 07/25/2014       Allergies & Home Medications   Personally reviewed in Epic    Continuous Infusions:       Scheduled Medications:    [START ON 11/08/2022] ascorbic acid (vitamin C)  1,000 mg Oral Daily    [START ON 11/08/2022] atorvastatin  10 mg Oral Daily    [START ON 11/08/2022] azithromycin  250 mg Oral Daily    calcium carbonate  1,200 mg elem calcium Oral BID    ciprofloxacin HCl  750 mg Oral daily    coenzyme Q10  10-300 mg Oral Daily    [START ON 11/08/2022] dilTIAZem  120 mg Oral Daily    docusate sodium  100 mg Oral Nightly    dornase alfa  2.5 mg Inhalation Daily    enoxaparin (LOVENOX) injection  40 mg Subcutaneous Q24H    fluticasone propionate  1 spray Each Nare Daily    HYDROcodone-acetaminophen  1 tablet Oral TID    ipratropium-albuterol  3 mL Nebulization 4x Daily (RT)    montelukast  10 mg Oral Nightly    multivitamins (ADULT)  1 tablet Oral Daily    pantoprazole  20 mg Oral Daily    piperacillin-tazobactam (ZOSYN) IV (intermittent)  4.5 g Intravenous Q6H    sodium chloride  5 mL Nebulization 4x Daily (RT)    tobramycin (PF)  300 mg Nebulization BID (RT)       PRN medications:  aluminum-magnesium hydroxide-simethicone, guaiFENesin, melatonin, senna

## 2022-11-08 DIAGNOSIS — J9611 Chronic respiratory failure with hypoxia: Secondary | ICD-10-CM | POA: Diagnosis not present

## 2022-11-08 DIAGNOSIS — B965 Pseudomonas (aeruginosa) (mallei) (pseudomallei) as the cause of diseases classified elsewhere: Secondary | ICD-10-CM | POA: Diagnosis not present

## 2022-11-08 DIAGNOSIS — J471 Bronchiectasis with (acute) exacerbation: Secondary | ICD-10-CM | POA: Diagnosis not present

## 2022-11-08 LAB — CBC
HEMATOCRIT: 39 % (ref 39.0–48.0)
HEMOGLOBIN: 13.1 g/dL (ref 12.9–16.5)
MEAN CORPUSCULAR HEMOGLOBIN CONC: 33.5 g/dL (ref 32.0–36.0)
MEAN CORPUSCULAR HEMOGLOBIN: 31.1 pg (ref 25.9–32.4)
MEAN CORPUSCULAR VOLUME: 92.8 fL (ref 77.6–95.7)
MEAN PLATELET VOLUME: 8.4 fL (ref 6.8–10.7)
PLATELET COUNT: 164 10*9/L (ref 150–450)
RED BLOOD CELL COUNT: 4.2 10*12/L — ABNORMAL LOW (ref 4.26–5.60)
RED CELL DISTRIBUTION WIDTH: 12.5 % (ref 12.2–15.2)
WBC ADJUSTED: 5.9 10*9/L (ref 3.6–11.2)

## 2022-11-08 LAB — BASIC METABOLIC PANEL
ANION GAP: 3 mmol/L — ABNORMAL LOW (ref 5–14)
BLOOD UREA NITROGEN: 18 mg/dL (ref 9–23)
BUN / CREAT RATIO: 18
CALCIUM: 8.9 mg/dL (ref 8.7–10.4)
CHLORIDE: 107 mmol/L (ref 98–107)
CO2: 30.9 mmol/L (ref 20.0–31.0)
CREATININE: 1.02 mg/dL
EGFR CKD-EPI (2021) MALE: 80 mL/min/{1.73_m2} (ref >=60)
GLUCOSE RANDOM: 84 mg/dL (ref 70–179)
POTASSIUM: 4.2 mmol/L (ref 3.5–5.1)
SODIUM: 141 mmol/L (ref 135–145)

## 2022-11-08 MED ADMIN — ipratropium-albuterol (DUO-NEB) 0.5-2.5 mg/3 mL nebulizer solution 3 mL: 3 mL | RESPIRATORY_TRACT | @ 12:00:00

## 2022-11-08 MED ADMIN — budesonide (PULMICORT) nebulizer solution 0.5 mg: .5 mg | RESPIRATORY_TRACT | @ 01:00:00

## 2022-11-08 MED ADMIN — dilTIAZem (CARDIZEM CD) 24 hr capsule 120 mg: 120 mg | ORAL | @ 12:00:00

## 2022-11-08 MED ADMIN — ipratropium-albuterol (DUO-NEB) 0.5-2.5 mg/3 mL nebulizer solution 3 mL: 3 mL | RESPIRATORY_TRACT | @ 20:00:00

## 2022-11-08 MED ADMIN — tobramycin (PF) (TOBI) 300 mg/5 mL nebulizer solution 300 mg: 300 mg | RESPIRATORY_TRACT | @ 12:00:00 | Stop: 2022-11-14

## 2022-11-08 MED ADMIN — multivitamin with folic acid 400 mcg tablet 1 tablet: 1 | ORAL | @ 12:00:00

## 2022-11-08 MED ADMIN — ipratropium-albuterol (DUO-NEB) 0.5-2.5 mg/3 mL nebulizer solution 3 mL: 3 mL | RESPIRATORY_TRACT | @ 01:00:00

## 2022-11-08 MED ADMIN — arformoterol (BROVANA) nebulizer solution 15 mcg/2 mL: 15 ug | RESPIRATORY_TRACT | @ 01:00:00

## 2022-11-08 MED ADMIN — ascorbic acid (vitamin C) (VITAMIN C) tablet 1,000 mg: 1000 mg | ORAL | @ 12:00:00

## 2022-11-08 MED ADMIN — calcium carbonate tablet 600 mg elem calcium: 600 mg | ORAL | @ 02:00:00

## 2022-11-08 MED ADMIN — montelukast (SINGULAIR) tablet 10 mg: 10 mg | ORAL | @ 02:00:00

## 2022-11-08 MED ADMIN — piperacillin-tazobactam (ZOSYN) IVPB (premix) 4.5 g: 4.5 g | INTRAVENOUS | @ 17:00:00 | Stop: 2022-11-14

## 2022-11-08 MED ADMIN — atorvastatin (LIPITOR) tablet 10 mg: 10 mg | ORAL | @ 12:00:00

## 2022-11-08 MED ADMIN — azithromycin (ZITHROMAX) tablet 250 mg: 250 mg | ORAL | @ 12:00:00 | Stop: 2022-11-15

## 2022-11-08 MED ADMIN — docusate sodium (COLACE) capsule 100 mg: 100 mg | ORAL | @ 02:00:00

## 2022-11-08 MED ADMIN — sodium chloride 10 % NEBULIZER solution 5 mL: 5 mL | RESPIRATORY_TRACT | @ 20:00:00

## 2022-11-08 MED ADMIN — piperacillin-tazobactam (ZOSYN) IVPB (premix) 4.5 g: 4.5 g | INTRAVENOUS | @ 22:00:00 | Stop: 2022-11-14

## 2022-11-08 MED ADMIN — sodium chloride 10 % NEBULIZER solution 5 mL: 5 mL | RESPIRATORY_TRACT | @ 16:00:00

## 2022-11-08 MED ADMIN — calcium carbonate tablet 600 mg elem calcium: 600 mg | ORAL | @ 12:00:00

## 2022-11-08 MED ADMIN — piperacillin-tazobactam (ZOSYN) IVPB (premix) 4.5 g: 4.5 g | INTRAVENOUS | @ 12:00:00 | Stop: 2022-11-14

## 2022-11-08 MED ADMIN — budesonide (PULMICORT) nebulizer solution 0.5 mg: .5 mg | RESPIRATORY_TRACT | @ 12:00:00

## 2022-11-08 MED ADMIN — HYDROcodone-acetaminophen (NORCO) 5-325 mg per tablet 1.5 tablet: 1.5 | ORAL | @ 17:00:00 | Stop: 2022-11-21

## 2022-11-08 MED ADMIN — sodium chloride 10 % NEBULIZER solution 5 mL: 5 mL | RESPIRATORY_TRACT | @ 12:00:00

## 2022-11-08 MED ADMIN — fluticasone propionate (FLONASE) 50 mcg/actuation nasal spray 1 spray: 1 | NASAL | @ 12:00:00

## 2022-11-08 MED ADMIN — dornase alfa (PULMOZYME) 1 mg/mL solution 2.5 mg: 2.5 mg | RESPIRATORY_TRACT | @ 12:00:00

## 2022-11-08 MED ADMIN — pantoprazole (Protonix) EC tablet 20 mg: 20 mg | ORAL | @ 12:00:00

## 2022-11-08 MED ADMIN — tobramycin (PF) (TOBI) 300 mg/5 mL nebulizer solution 300 mg: 300 mg | RESPIRATORY_TRACT | @ 01:00:00 | Stop: 2022-11-14

## 2022-11-08 MED ADMIN — ciprofloxacin HCl (CIPRO) tablet 750 mg: 750 mg | ORAL | @ 11:00:00 | Stop: 2022-11-14

## 2022-11-08 MED ADMIN — arformoterol (BROVANA) nebulizer solution 15 mcg/2 mL: 15 ug | RESPIRATORY_TRACT | @ 12:00:00

## 2022-11-08 MED ADMIN — ipratropium-albuterol (DUO-NEB) 0.5-2.5 mg/3 mL nebulizer solution 3 mL: 3 mL | RESPIRATORY_TRACT | @ 16:00:00

## 2022-11-08 MED ADMIN — HYDROcodone-acetaminophen (NORCO) 5-325 mg per tablet 1.5 tablet: 1.5 | ORAL | @ 11:00:00 | Stop: 2022-11-21

## 2022-11-08 MED ADMIN — sodium chloride 10 % NEBULIZER solution 5 mL: 5 mL | RESPIRATORY_TRACT | @ 01:00:00

## 2022-11-08 MED ADMIN — ciprofloxacin HCl (CIPRO) tablet 750 mg: 750 mg | ORAL | @ 22:00:00 | Stop: 2022-11-14

## 2022-11-08 MED ADMIN — piperacillin-tazobactam (ZOSYN) IVPB (premix) 4.5 g: 4.5 g | INTRAVENOUS | @ 06:00:00 | Stop: 2022-11-14

## 2022-11-08 MED ADMIN — HYDROcodone-acetaminophen (NORCO) 5-325 mg per tablet 1.5 tablet: 1.5 | ORAL | @ 02:00:00 | Stop: 2022-11-21

## 2022-11-08 NOTE — Unmapped (Signed)
No acute changes, vitals within normal limits. IV abx given per order. RT following for breathing treatments. Pt tolerating diet. Safety and fall precautions, will continue plan of care.     Problem: Adult Inpatient Plan of Care  Goal: Absence of Hospital-Acquired Illness or Injury  Intervention: Identify and Manage Fall Risk  Recent Flowsheet Documentation  Taken 11/08/2022 0200 by Kendrick Fries, RN  Safety Interventions:   low bed   fall reduction program maintained  Taken 11/07/2022 2200 by Kendrick Fries, RN  Safety Interventions:   low bed   fall reduction program maintained  Taken 11/07/2022 2000 by Kendrick Fries, RN  Safety Interventions:   low bed   fall reduction program maintained  Intervention: Prevent Skin Injury  Recent Flowsheet Documentation  Taken 11/08/2022 0200 by Kendrick Fries, RN  Positioning for Skin: Right  Taken 11/08/2022 0000 by Kendrick Fries, RN  Positioning for Skin: Supine/Back  Taken 11/07/2022 2000 by Kendrick Fries, RN  Positioning for Skin: Sitting in Chair     Problem: Fall Injury Risk  Goal: Absence of Fall and Fall-Related Injury  Intervention: Promote Injury-Free Environment  Recent Flowsheet Documentation  Taken 11/08/2022 0200 by Kendrick Fries, RN  Safety Interventions:   low bed   fall reduction program maintained  Taken 11/07/2022 2200 by Kendrick Fries, RN  Safety Interventions:   low bed   fall reduction program maintained  Taken 11/07/2022 2000 by Kendrick Fries, RN  Safety Interventions:   low bed   fall reduction program maintained

## 2022-11-08 NOTE — Unmapped (Signed)
Problem: Gas Exchange Impaired  Goal: Optimal Gas Exchange  Outcome: Ongoing - Unchanged   Pt received all scheduled breathing treatments and AirwayClearence Metaneb and Vibralung. Pt expectorated significant amount of sputum after each treatment tapered down after my final airway clearance with Virbalung and Metaneb.

## 2022-11-08 NOTE — Unmapped (Signed)
[place] Hospitalist Daily Progress Note     LOS: 1 day     Assessment/Plan:  Principal Problem:    Acute exacerbation of bronchiectasis (CMS-HCC)  Active Problems:    Essential hypertension    Chronic respiratory failure with hypoxia (CMS-HCC)         Acute exacerbation of bronchiectasis (CMS-HCC)   Acute on chronic hypoxemic respiratory failure  Follows with Dr. Garner Nash from Pulmonary Medicine for bronchiectasis. History of MDR Pseudomonas colonization. On 2L O2 via [place] at baseline. Presented with increasing dyspnea and production of purulent sputum over the last month. 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 Developed peripheral neuropathy possibly 2/2 voriconazole. Stable/improved off treatment. No complaints this morning. Ready for PICC placement tomorrow.    - Pulmonology following  - Ciprofloxacin 750mg  po BID, Zosyn IV, Tobramycin nebs  - Continue home Azithromycin daily for anti-inflammatory affect  - Singulair qhs   - Hold home trelegy (hasn't been able to start this due to insurance/financial issues)  - Arformoterol BID  - Budesonide 0.5mg  BID  - Duonebs QID (pre-treatment for hypertonic saline)  - Airway clearance QID (RT)              - Hypertonic saline 10% nebs              - mechanical clearance: Metaneb and Vibralung              - Daily Pulmozyme  - Will need PICC line for anticipated 2-3 weeks of abx; ordered  - CRP and ESR weekly; continue on discharge  - EKG on hospital day 3 for QT monitoring on Cipro and Azithro  - FU sputum culture and AFB culture  - Continuous O2 monitoring    Essential hypertension - Aortic Root Dilation   BP acceptable  - Diltiazem 120 every day  - Atorvastatin 10 qhs      Constipation  - Colace qhs, as per his request.   - Monitor for BMs and intensify if needed     GERD  - Pantoprazole    - Norco q8h     DVT prophylaxis. Ambulatory      Disposition: Floor. DC to home after approximately 1 week hospitalization. Please page the [facility] pager at [phone] with questions.      Pending labs:   Pending Labs       Order Current Status    Basic metabolic panel Collected (11/08/22 0654)    CBC Collected (11/08/22 0654)    AFB SMEAR In process    AFB culture In process    Lower Respiratory Culture Preliminary result            Subjective:   Slept OK last night. No worsening cough, SOB, or sputum production this morning.      Objective:   Physical Exam:    GEN: NAD, sitting up to chair.    RESPIRATORY: Breathing pattern was normal and the chest moved symmetrically.  Lung sounds were diminished on the right.    CARDIOVASCULAR: Heart rate and rhythm were normal.  S1 and S2 were normal and there were no extra sounds or murmurs.   ABDOMEN: The abdomen was normal in contour.  Bowel sounds were present.  Palpation detected no tenderness.   GENITOURINARY - foley absent.    NEUROLOGIC: Mental status was normal.   The patient was normally coordinated in the arms  and legs.  PSYCHIATRIC: The patient was oriented to person, place, time, and circumstance. Speech was normal. Mood and affect were normal     Vital signs in last 24 hours:  Temp:  [35.9 ??C (96.6 ??F)-36.3 ??C (97.3 ??F)] 36.3 ??C (97.3 ??F)  Heart Rate:  [69-87] 69  Resp:  [17-18] 18  BP: (116-123)/(65-97) 120/97  MAP (mmHg):  [82-92] 82  SpO2:  [91 %-100 %] 100 %    Intake/Output last 24 hours:    Intake/Output Summary (Last 24 hours) at 11/08/2022 0454  Last data filed at 11/07/2022 1750  Gross per 24 hour   Intake 440 ml   Output --   Net 440 ml       Medications:   Scheduled Meds:   arformoterol  15 mcg Nebulization BID (RT)    ascorbic acid (vitamin C)  1,000 mg Oral Daily    atorvastatin  10 mg Oral Daily    azithromycin  250 mg Oral Daily    budesonide (PULMICORT) nebulizer solution  0.5 mg Nebulization BID (RT)    calcium carbonate  600 mg elem calcium Oral BID    ciprofloxacin HCl  750 mg Oral BID    dilTIAZem  120 mg Oral Daily    docusate sodium  100 mg Oral Nightly    dornase alfa  2.5 mg Inhalation Daily    fluticasone propionate  1 spray Each Nare Daily    HYDROcodone-acetaminophen  1.5 tablet Oral Q8H    ipratropium-albuterol  3 mL Nebulization 4x Daily (RT)    montelukast  10 mg Oral Nightly    multivitamins (ADULT)  1 tablet Oral Daily    pantoprazole  20 mg Oral Daily    piperacillin-tazobactam (ZOSYN) IV (intermittent)  4.5 g Intravenous Q6H    sodium chloride  5 mL Nebulization 4x Daily (RT)    tobramycin (PF)  300 mg Nebulization BID (RT)     Continuous Infusions:    D Linford Arnold, MD MD  [facility].

## 2022-11-08 NOTE — Unmapped (Signed)
General Pulmonary Team Follow Up Consult Note     Date of Service: 11/08/2022  Requesting Physician: Hubbard Robinson, MD   Requesting Service: Med Undesignated (MDX)  Reason for consultation: Comprehensive evaluation of  bronchiectasis exacerbation in the setting of chronic hypoxemic respiratory failure and PsA colonization .    Hospital Problems:  Principal Problem:    Acute exacerbation of bronchiectasis (CMS-HCC)  Active Problems:    Essential hypertension    Chronic respiratory failure with hypoxia (CMS-HCC)      HPI: Jordan [name] is a 67 y.o. male with bronchiectasis with chronic hypoxemic respiratory failure, Pseudomonas colonization, and prior history of lung resections.  I saw him in clinic on 10/23/2022 when he reported more frequent spells of chest tightness, wheezing, and difficulty expectorating sputum.  Based on this, we agreed to schedule an admission for IV antibiotics and aggressive airway clearance using alternative modalities to those that he uses at home.  No interval changes since I saw him in clinic aside from [place].  He had a good time.    Problems addressed during this consult include bronchiectasis exacerbation in the setting of chronic hypoxemic respiratory failure and Pseudomonas colonization. Based on these problems, the patient has moderate risk of morbidity/mortality which is commensurate w their risk of management options described below in the recommendations.    Assessment      Interval Events:  Performed airway clearance with Vibralung and Metaneb overnight and today.  Feels like he is bringing up more mucus though still has chest congestion. Mucus is lightening up in color.       Impression: The patient has made minimal clinical progress since last encounter. I personally reviewed most recent pertinent labs, imaging and micro data, from 11/08/2022, which are noted in recommendations..        Recommendations Antibiotics:  Based on prior culture data (OPF, PsA) and allergy profile, continue treating with Zosyn IV, Cipro 750 mg oral, and [name] nebs.  Will need single lumen PICC line for 2-3 weeks of IV antibiotics. Duration to be determined based on clinical response and improvement in inflammatory markers.  Continue chronic azithromycin for anti-inflammatory effect.     Other medications:  Trelegy converted to Duonebs (pre-treatment before hypertonic saline), budesonide 0.5 mg bid, and arformoterol 15 mcg bid.     Airway clearance: Needs aggressive airway clearance 4 times per day.  Nebulized hypertonic saline 10%; pre-treatment with albuterol/ipratropium.  For mechanical clearance, continue Metaneb and Vibralung. He will let me know if he likes the metaneb enough to pursue similar device for home use.  Continue daily Pulmozyme     Labs:  Admission labs done. CBC-D notable for elevated absolute eosinophils to 0.5. CMP WNL.  CRP elevated to 12.0 and ESR elevated to 52.   Sputum for bacterial and AFB cultures (NTM screening) in process.  Weekly CRP and ESR to trend (including as outpatient).  [facility] Guideline for Monitoring Outpatient Intravenous and Oral Antimicrobial Therapy recommended monitoring labs ([email or web address])     Imaging:  CXR as noted below.     Other Testing:  Patient does not need PFTs performed while in the hospital.  EKG after a few days of Cipro and azithromycin     [facility]):  Patient is not a candidate for [facility] due to distance.     Dispo:   Patient has a follow-up appointment with me scheduled for 01/26/23.  Patient does  not need to remain in the hospital for the entire course of IV antibiotics but I would prefer that he spend around a week in the hospital to intensify his airway clearance using alternative devices.   Please reach out to [facility] Bronchiectasis/NTM Care and [facility] team (myself, Alvino Blood RN, Eliberto Ivory CPP) prior to discharge to ensure that home infusion orders are correct and that we are comfortable with patient discharging home on IVs at that time.  We will follow monitoring labs.  Please use [email or web address] for home infusion orders. We will follow monitoring labs. Does not need OPAT.  Do not discharge Friday through Sunday or on a holiday as no outpatient clinical staff available to assist if problems arise with home health/home infusion.     The recommendations outlined in this note were discussed w the primary team via epic chat. Please do not hesitate to page [phone] [facility] consult) with questions. We appreciate the opportunity to assist in the care of this patient. We look forward to following with you.    Attestation      I directly provided consultative services as documented in this note and I personally spent 40 minutes face-to-face and non face-to-face in the care of this patient, which includes all pre, intra and post visit time on the date of service including examining the patient, evaluating/interpreting objective clinical data, coordinating care with the entire multidisciplinary care team and developing a comprehensive management plan as outlined above. All documented time was specific to the E/M visit and does not include any procedures that may have been performed.      Viona Gilmore, MD    Subjective & Objective     Vitals - past 24 hours  Temp:  [35.9 ??C (96.6 ??F)-36.3 ??C (97.3 ??F)] 36.3 ??C (97.3 ??F)  Heart Rate:  [69-87] 69  Resp:  [17-18] 18  BP: (116-123)/(65-97) 120/97  SpO2:  [91 %-100 %] 100 % Intake/Output  I/O last 3 completed shifts:  In: 440 [P.O.:240; IV Piggyback:200]  Out: -       Pertinent exam findings:   General appearance - well appearing, well nourished, and in no distress  Eyes - EOMI, PERRLA, anicteric sclerae, pink conjunctiva  Mouth - moist mucous membranes, no pharyngeal erythema or exudates  Neck - Trachea midline, normal neck movement  Lymphatics - not examined  Heart - regular rate and rhythm, normal S1/S2, no gallops, rubs, or murmurs  Chest -absent breath sounds over the right middle and lower lobe, corresponding with prior resection.  Inspiratory and expiratory crackles over left mid and lower lung fields with extension to the mid axillary line - improved over left mid/upper lobe.  Easy work of breathing without accessory muscle use.  Speaking in full sentences without difficulty.  Abdomen - not examined  Extremities -trace pitting lower extremity edema extending about 2 fingerbreadths above the ankles.  Clubbing of fingers.  No cyanosis.  Skin - normal coloration and turgor, no rashes, no suspicious skin lesions noted  Neurological - alert, oriented, normal speech, no focal findings or movement disorder noted    Relevant Imaging:  - CXR on 11/07/2022 revealed stable architectural distortion, volume loss, and bronchiectasis in the right upper lobe as well as stable extensive bronchiectasis and bronchial wall thickening of the left lung.    Arterial Blood Gas:   No results for input(s): SPECTYPEART, PHART, PCO2ART, PO2ART, HCO3ART, BEART, O2SATART in the last 24 hours.     Venous Blood Gas:  No results for input(s): PHVEN, PCO2VEN, PO2VEN, HCO3VEN, BEVEN, O2SATVEN in the last 24 hours.     Cultures:  Lower Respiratory Culture (no units)   Date Value   10/23/2022 2+ Mucoid Pseudomonas aeruginosa (A)   10/23/2022 4+ Oropharyngeal Flora Isolated     WBC (10*9/L)   Date Value   11/08/2022 5.9          Other Labs:  Lab Results   Component Value Date    WBC 5.9 11/08/2022    HGB 13.1 11/08/2022    HCT 39.0 11/08/2022    PLT 164 11/08/2022     Lab Results   Component Value Date    NA 141 11/08/2022    K 4.2 11/08/2022    CL 107 11/08/2022    CO2 30.9 11/08/2022    BUN 18 11/08/2022    CREATININE 1.02 11/08/2022    GLU 84 11/08/2022    CALCIUM 8.9 11/08/2022    MG 1.5 (L) 12/22/2021    PHOS 3.7 01/22/2016     Lab Results   Component Value Date    BILITOT 0.4 11/07/2022    BILIDIR 0.40 04/02/2015    PROT 6.8 11/07/2022    ALBUMIN 3.4 11/07/2022    ALT 12 11/07/2022    AST 13 11/07/2022    ALKPHOS 84 11/07/2022    GGT 64 06/21/2013     Lab Results   Component Value Date    LABPROT 10.4 08/03/2013    INR 0.95 04/14/2017    APTT 32.6 07/25/2014       Allergies & Home Medications   Personally reviewed in Epic    Continuous Infusions:       Scheduled Medications:    arformoterol  15 mcg Nebulization BID (RT)    ascorbic acid (vitamin C)  1,000 mg Oral Daily    atorvastatin  10 mg Oral Daily    azithromycin  250 mg Oral Daily    budesonide (PULMICORT) nebulizer solution  0.5 mg Nebulization BID (RT)    calcium carbonate  600 mg elem calcium Oral BID    ciprofloxacin HCl  750 mg Oral BID    dilTIAZem  120 mg Oral Daily    docusate sodium  100 mg Oral Nightly    dornase alfa  2.5 mg Inhalation Daily    fluticasone propionate  1 spray Each Nare Daily    HYDROcodone-acetaminophen  1.5 tablet Oral Q8H    ipratropium-albuterol  3 mL Nebulization 4x Daily (RT)    montelukast  10 mg Oral Nightly    multivitamins (ADULT)  1 tablet Oral Daily    pantoprazole  20 mg Oral Daily    piperacillin-tazobactam (ZOSYN) IV (intermittent)  4.5 g Intravenous Q6H    sodium chloride  5 mL Nebulization 4x Daily (RT)    tobramycin (PF)  300 mg Nebulization BID (RT)       PRN medications:  aluminum-magnesium hydroxide-simethicone, guaiFENesin, melatonin, senna

## 2022-11-09 DIAGNOSIS — K5900 Constipation, unspecified: Secondary | ICD-10-CM | POA: Diagnosis not present

## 2022-11-09 DIAGNOSIS — J9621 Acute and chronic respiratory failure with hypoxia: Secondary | ICD-10-CM | POA: Diagnosis not present

## 2022-11-09 DIAGNOSIS — I10 Essential (primary) hypertension: Secondary | ICD-10-CM | POA: Diagnosis not present

## 2022-11-09 DIAGNOSIS — J471 Bronchiectasis with (acute) exacerbation: Secondary | ICD-10-CM | POA: Diagnosis not present

## 2022-11-09 LAB — BASIC METABOLIC PANEL
ANION GAP: 5 mmol/L (ref 5–14)
BLOOD UREA NITROGEN: 18 mg/dL (ref 9–23)
BUN / CREAT RATIO: 16
CALCIUM: 9.1 mg/dL (ref 8.7–10.4)
CHLORIDE: 106 mmol/L (ref 98–107)
CO2: 31.3 mmol/L — ABNORMAL HIGH (ref 20.0–31.0)
CREATININE: 1.12 mg/dL
EGFR CKD-EPI (2021) MALE: 72 mL/min/{1.73_m2} (ref >=60)
GLUCOSE RANDOM: 88 mg/dL (ref 70–179)
POTASSIUM: 4 mmol/L (ref 3.5–5.1)
SODIUM: 142 mmol/L (ref 135–145)

## 2022-11-09 LAB — CBC
HEMATOCRIT: 36.9 % — ABNORMAL LOW (ref 39.0–48.0)
HEMOGLOBIN: 12.3 g/dL — ABNORMAL LOW (ref 12.9–16.5)
MEAN CORPUSCULAR HEMOGLOBIN CONC: 33.5 g/dL (ref 32.0–36.0)
MEAN CORPUSCULAR HEMOGLOBIN: 30.8 pg (ref 25.9–32.4)
MEAN CORPUSCULAR VOLUME: 92 fL (ref 77.6–95.7)
MEAN PLATELET VOLUME: 8 fL (ref 6.8–10.7)
PLATELET COUNT: 169 10*9/L (ref 150–450)
RED BLOOD CELL COUNT: 4.01 10*12/L — ABNORMAL LOW (ref 4.26–5.60)
RED CELL DISTRIBUTION WIDTH: 12.6 % (ref 12.2–15.2)
WBC ADJUSTED: 5.9 10*9/L (ref 3.6–11.2)

## 2022-11-09 MED ADMIN — sodium chloride 10 % NEBULIZER solution 5 mL: 5 mL | RESPIRATORY_TRACT

## 2022-11-09 MED ADMIN — multivitamin with folic acid 400 mcg tablet 1 tablet: 1 | ORAL | @ 14:00:00

## 2022-11-09 MED ADMIN — sodium chloride (NS) 0.9 % flush 10 mL: 10 mL | INTRAVENOUS | @ 22:00:00

## 2022-11-09 MED ADMIN — budesonide (PULMICORT) nebulizer solution 0.5 mg: .5 mg | RESPIRATORY_TRACT | @ 01:00:00

## 2022-11-09 MED ADMIN — sodium chloride 10 % NEBULIZER solution 5 mL: 5 mL | RESPIRATORY_TRACT | @ 20:00:00

## 2022-11-09 MED ADMIN — ipratropium-albuterol (DUO-NEB) 0.5-2.5 mg/3 mL nebulizer solution 3 mL: 3 mL | RESPIRATORY_TRACT | @ 12:00:00

## 2022-11-09 MED ADMIN — tobramycin (PF) (TOBI) 300 mg/5 mL nebulizer solution 300 mg: 300 mg | RESPIRATORY_TRACT | @ 01:00:00 | Stop: 2022-11-14

## 2022-11-09 MED ADMIN — HYDROcodone-acetaminophen (NORCO) 5-325 mg per tablet 1.5 tablet: 1.5 | ORAL | @ 01:00:00 | Stop: 2022-11-21

## 2022-11-09 MED ADMIN — pantoprazole (Protonix) EC tablet 20 mg: 20 mg | ORAL | @ 14:00:00

## 2022-11-09 MED ADMIN — ipratropium-albuterol (DUO-NEB) 0.5-2.5 mg/3 mL nebulizer solution 3 mL: 3 mL | RESPIRATORY_TRACT | @ 20:00:00

## 2022-11-09 MED ADMIN — montelukast (SINGULAIR) tablet 10 mg: 10 mg | ORAL | @ 01:00:00

## 2022-11-09 MED ADMIN — atorvastatin (LIPITOR) tablet 10 mg: 10 mg | ORAL | @ 14:00:00

## 2022-11-09 MED ADMIN — HYDROcodone-acetaminophen (NORCO) 5-325 mg per tablet 1.5 tablet: 1.5 | ORAL | @ 18:00:00 | Stop: 2022-11-21

## 2022-11-09 MED ADMIN — budesonide (PULMICORT) nebulizer solution 0.5 mg: .5 mg | RESPIRATORY_TRACT | @ 12:00:00

## 2022-11-09 MED ADMIN — calcium carbonate tablet 600 mg elem calcium: 600 mg | ORAL | @ 14:00:00

## 2022-11-09 MED ADMIN — fluticasone propionate (FLONASE) 50 mcg/actuation nasal spray 1 spray: 1 | NASAL | @ 14:00:00

## 2022-11-09 MED ADMIN — azithromycin (ZITHROMAX) tablet 250 mg: 250 mg | ORAL | @ 14:00:00 | Stop: 2022-11-15

## 2022-11-09 MED ADMIN — dilTIAZem (CARDIZEM CD) 24 hr capsule 120 mg: 120 mg | ORAL | @ 14:00:00

## 2022-11-09 MED ADMIN — ipratropium-albuterol (DUO-NEB) 0.5-2.5 mg/3 mL nebulizer solution 3 mL: 3 mL | RESPIRATORY_TRACT | @ 15:00:00

## 2022-11-09 MED ADMIN — arformoterol (BROVANA) nebulizer solution 15 mcg/2 mL: 15 ug | RESPIRATORY_TRACT | @ 12:00:00

## 2022-11-09 MED ADMIN — HYDROcodone-acetaminophen (NORCO) 5-325 mg per tablet 1.5 tablet: 1.5 | ORAL | @ 10:00:00 | Stop: 2022-11-21

## 2022-11-09 MED ADMIN — sodium chloride 10 % NEBULIZER solution 5 mL: 5 mL | RESPIRATORY_TRACT | @ 15:00:00

## 2022-11-09 MED ADMIN — dornase alfa (PULMOZYME) 1 mg/mL solution 2.5 mg: 2.5 mg | RESPIRATORY_TRACT | @ 12:00:00

## 2022-11-09 MED ADMIN — docusate sodium (COLACE) capsule 100 mg: 100 mg | ORAL | @ 01:00:00

## 2022-11-09 MED ADMIN — tobramycin (PF) (TOBI) 300 mg/5 mL nebulizer solution 300 mg: 300 mg | RESPIRATORY_TRACT | @ 12:00:00 | Stop: 2022-11-14

## 2022-11-09 MED ADMIN — piperacillin-tazobactam (ZOSYN) IVPB (premix) 4.5 g: 4.5 g | INTRAVENOUS | @ 10:00:00 | Stop: 2022-11-14

## 2022-11-09 MED ADMIN — sodium chloride 10 % NEBULIZER solution 5 mL: 5 mL | RESPIRATORY_TRACT | @ 12:00:00

## 2022-11-09 MED ADMIN — arformoterol (BROVANA) nebulizer solution 15 mcg/2 mL: 15 ug | RESPIRATORY_TRACT

## 2022-11-09 MED ADMIN — ciprofloxacin HCl (CIPRO) tablet 750 mg: 750 mg | ORAL | @ 10:00:00 | Stop: 2022-11-14

## 2022-11-09 MED ADMIN — ascorbic acid (vitamin C) (VITAMIN C) tablet 1,000 mg: 1000 mg | ORAL | @ 14:00:00

## 2022-11-09 MED ADMIN — ciprofloxacin HCl (CIPRO) tablet 750 mg: 750 mg | ORAL | @ 22:00:00 | Stop: 2022-11-14

## 2022-11-09 MED ADMIN — piperacillin-tazobactam (ZOSYN) IVPB (premix) 4.5 g: 4.5 g | INTRAVENOUS | @ 22:00:00 | Stop: 2022-11-14

## 2022-11-09 MED ADMIN — piperacillin-tazobactam (ZOSYN) IVPB (premix) 4.5 g: 4.5 g | INTRAVENOUS | @ 17:00:00 | Stop: 2022-11-14

## 2022-11-09 MED ADMIN — sodium chloride (NS) 0.9 % flush 10 mL: 10 mL | INTRAVENOUS | @ 17:00:00

## 2022-11-09 MED ADMIN — ipratropium-albuterol (DUO-NEB) 0.5-2.5 mg/3 mL nebulizer solution 3 mL: 3 mL | RESPIRATORY_TRACT

## 2022-11-09 MED ADMIN — calcium carbonate tablet 600 mg elem calcium: 600 mg | ORAL | @ 01:00:00

## 2022-11-09 NOTE — Unmapped (Signed)
Problem: Gas Exchange Impaired  Goal: Optimal Gas Exchange  11/09/2022 0540 by Hilarie Fredrickson, RRT  Outcome: Progressing  11/09/2022 0540 by Hilarie Fredrickson, RRT  Outcome: Progressing     Problem: Airway Clearance Ineffective  Goal: Effective Airway Clearance  Outcome: Progressing   Received and remains on 3L , inhaled mediactions given as ordered without adverse reaction, metaneb and vibralung performed for airway clearance with moderate amount of thick tan secretions, no complications or distress noted this shift.

## 2022-11-09 NOTE — Unmapped (Signed)
Problem: Adult Inpatient Plan of Care  Goal: Plan of Care Review  11/08/2022 1817 by Tristan Schroeder, RN  Outcome: Progressing  Flowsheets (Taken 11/08/2022 1817)  Plan of Care Reviewed With: patient  Note: Pt admitted with bronchiectasis. He is receiving neb tx's as well IV antibiotics. PICC line to be placed tomorrow. Will continue with POC.    11/08/2022 1817 by Tristan Schroeder, RN  Outcome: Progressing  Flowsheets (Taken 11/08/2022 1817)  Plan of Care Reviewed With: patient  Goal: Patient-Specific Goal (Individualized)  11/08/2022 1817 by Tristan Schroeder, RN  Outcome: Progressing  11/08/2022 1817 by Tristan Schroeder, RN  Outcome: Progressing  Goal: Absence of Hospital-Acquired Illness or Injury  11/08/2022 1817 by Tristan Schroeder, RN  Outcome: Progressing  11/08/2022 1817 by Tristan Schroeder, RN  Outcome: Progressing  Goal: Optimal Comfort and Wellbeing  11/08/2022 1817 by Tristan Schroeder, RN  Outcome: Progressing  11/08/2022 1817 by Tristan Schroeder, RN  Outcome: Progressing  Goal: Readiness for Transition of Care  11/08/2022 1817 by Tristan Schroeder, RN  Outcome: Progressing  11/08/2022 1817 by Tristan Schroeder, RN  Outcome: Progressing  Goal: Rounds/Family Conference  11/08/2022 1817 by Tristan Schroeder, RN  Outcome: Progressing  11/08/2022 1817 by Tristan Schroeder, RN  Outcome: Progressing     Problem: Infection  Goal: Absence of Infection Signs and Symptoms  11/08/2022 1817 by Tristan Schroeder, RN  Outcome: Progressing  11/08/2022 1817 by Tristan Schroeder, RN  Outcome: Progressing     Problem: Fall Injury Risk  Goal: Absence of Fall and Fall-Related Injury  11/08/2022 1817 by Tristan Schroeder, RN  Outcome: Progressing  11/08/2022 1817 by Tristan Schroeder, RN  Outcome: Progressing     Problem: Pain Acute  Goal: Optimal Pain Control and Function  11/08/2022 1817 by Tristan Schroeder, RN  Outcome: Progressing  11/08/2022 1817 by Tristan Schroeder, RN  Outcome: Progressing     Problem: Skin Injury Risk Increased  Goal: Skin Health and Integrity  11/08/2022 1817 by Tristan Schroeder, RN  Outcome: Progressing  11/08/2022 1817 by Tristan Schroeder, RN  Outcome: Progressing     Problem: Gas Exchange Impaired  Goal: Optimal Gas Exchange  11/08/2022 1817 by Tristan Schroeder, RN  Outcome: Progressing  11/08/2022 1817 by Tristan Schroeder, RN  Outcome: Progressing

## 2022-11-09 NOTE — Unmapped (Signed)
Daily Progress Note    Assessment/Plan:    Principal Problem:    Acute exacerbation of bronchiectasis (CMS-HCC)  Active Problems:    Essential hypertension    Chronic respiratory failure with hypoxia (CMS-HCC)                 Jordan [name] is a 67 y.o. male who presented to [facility] with Acute exacerbation of bronchiectasis (CMS-HCC).    Acute exacerbation of bronchiectasis (CMS-HCC)   Acute on chronic hypoxemic respiratory failure  Follows with Dr. Garner Nash from Pulmonary Medicine for bronchiectasis. History of MDR Pseudomonas colonization. On 2L O2 via [place] at baseline. Presented with increasing dyspnea and production of purulent sputum over the last month. 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 Developed peripheral neuropathy possibly 2/2 voriconazole. Stable/improved off treatment. No complaints this morning. Ready for PICC placement tomorrow.    - pulmonology following  - Ciprofloxacin 750mg  po BID, Zosyn IV, Tobramycin nebs  - continue home Azithromycin daily for anti-inflammatory affect  - Singulair qhs   - hold home trelegy (hasn't been able to start this due to insurance/financial issues)  - Arformoterol BID  - Budesonide 0.5mg  BID  - Duonebs QID (pre-treatment for hypertonic saline)  - airway clearance QID (RT)              - Hypertonic saline 10% nebs              - mechanical clearance: Metaneb and Vibralung              - Daily Pulmozyme  - PICC line for anticipated 2-3 weeks of abx; ordered  - CRP and ESR weekly; continue on discharge  - EKG on hospital day 3 for QT monitoring on Cipro and Azithro  - sputum culture and AFB culture pending  - continuous O2 monitoring  - follow-up appointment with me scheduled for 01/26/23   - see pulm consult note for dispo recommendations     Essential hypertension - Aortic Root Dilation   BP acceptable  - Diltiazem 120 every day  - Atorvastatin 10 qhs      Constipation  - Colace qhs, as per his request.   - monitor for BMs and intensify if needed     GERD  - Pantoprazole     DVT prophylaxis  - pt is ambulatory       Code Status / HCDM   -Full Code, Discussed with patient at the time of admission   -  HCDM (HCPOA): [place], Acre Spouse - [phone]     Disposition: Floor. DC to home after approximately 1 week hospitalization.       Please page the [facility]) pager at [phone] with questions.    I personally spent 35 minutes face-to-face and non-face-to-face in the care of this patient, which includes all pre, intra, and post visit time on the date of service.  All documented time was specific to the E/M visit and does not include any procedures that may have been performed.   __________________________________________________________________    Subjective:  Reports an episode of desaturation overnight while going to the bathroom.  DOE but no cp.  Rusty colored sputum without any hemoptysis.  Tolerating airway clearance therapy.    Labs/Studies:  Recent Results (from the past 24 hour(s))   Basic metabolic panel    Collection Time: 11/09/22  6:53 AM   Result Value Ref Range  Sodium 142 135 - 145 mmol/L    Potassium 4.0 3.5 - 5.1 mmol/L    Chloride 106 98 - 107 mmol/L    CO2 31.3 (H) 20.0 - 31.0 mmol/L    Anion Gap 5 5 - 14 mmol/L    BUN 18 9 - 23 mg/dL    Creatinine 2.95 6.21 - 1.18 mg/dL    BUN/Creatinine Ratio 16     eGFR CKD-EPI (2021) Male 71 >=60 mL/min/1.72m2    Glucose 88 70 - 179 mg/dL    Calcium 9.1 8.7 - 30.8 mg/dL   CBC    Collection Time: 11/09/22  6:53 AM   Result Value Ref Range    WBC 5.9 3.6 - 11.2 10*9/L    RBC 4.01 (L) 4.26 - 5.60 10*12/L    HGB 12.3 (L) 12.9 - 16.5 g/dL    HCT 65.7 (L) 84.6 - 48.0 %    MCV 92.0 77.6 - 95.7 fL    MCH 30.8 25.9 - 32.4 pg    MCHC 33.5 32.0 - 36.0 g/dL    RDW 96.2 95.2 - 84.1 %    MPV 8.0 6.8 - 10.7 fL    Platelet 169 150 - 450 10*9/L     Objective:  Temp:  [36.3 ??C (97.4 ??F)-37.1 ??C (98.8 ??F)] 36.3 ??C (97.4 ??F)  Heart Rate:  [77-86] 80  Resp: [18] 18  BP: (111-118)/(56-72) 118/72  SpO2:  [90 %-98 %] 94 %    GEN: pleasant elderly male sitting in a chair in NAD  HEENT: EOMI/MMM  CV: RRR no m/r/g  PULM: no breath sounds in RLL/RML with scattered rhonchi in RUL.  Left lung clear   ABD: soft, NT/ND, +BS  EXT: No edema  NEURO: alert and oriented x 3. No focal neuro deficits.

## 2022-11-09 NOTE — Unmapped (Signed)
Pt. Complained about moderate all over body pain. Norco administered as ordered and was effective.  Pt. Getting breathing treatments from RT.  Using 2 L O2 via [name] like at home.  Continuous pulsox in place. No calls from telemetry, except for a battery change.  Tolerating food and fluids. Ambulating to the bathroom. Voiding and continent a BM this shift.  Alert and oriented x 4. IV antibiotics administered as ordered.  PIV flushed and maintained. All safety measures maintained.  PICC line placement today.   Problem: Adult Inpatient Plan of Care  Goal: Plan of Care Review  Outcome: Progressing  Flowsheets (Taken 11/09/2022 0228)  Progress: improving  Outcome Evaluation: discharge home with home health  Plan of Care Reviewed With: patient  Goal: Patient-Specific Goal (Individualized)  Outcome: Progressing  Flowsheets (Taken 11/09/2022 0228)  Patient/Family-Specific Goals (Include Timeframe): Pt. will have a continuous pulsox above 90% from 7p-7a.  Individualized Care Needs: Breathing treatments, IV antibiotics, contact precautions, monitor labs/VS.  Anxieties, Fears or Concerns: Concerned about not being able to get a PICC line.  Goal: Absence of Hospital-Acquired Illness or Injury  Outcome: Progressing  Intervention: Identify and Manage Fall Risk  Recent Flowsheet Documentation  Taken 11/08/2022 2000 by Rhina Brackett, RN  Safety Interventions:   fall reduction program maintained   low bed  Intervention: Prevent Skin Injury  Recent Flowsheet Documentation  Taken 11/09/2022 0200 by Rhina Brackett, RN  Positioning for Skin: Supine/Back  Taken 11/09/2022 0000 by Rhina Brackett, RN  Positioning for Skin: Supine/Back  Taken 11/08/2022 2200 by Rhina Brackett, RN  Positioning for Skin: Supine/Back  Taken 11/08/2022 2100 by Rhina Brackett, RN  Positioning for Skin: Sitting in Chair  Taken 11/08/2022 2000 by Rhina Brackett, RN  Positioning for Skin: Sitting in Chair  Intervention: Prevent and Manage VTE (Venous Thromboembolism) Risk  Recent Flowsheet Documentation  Taken 11/09/2022 0200 by Rhina Brackett, RN  Anti-Embolism Intervention: (low risk) Other (Comment)  Taken 11/09/2022 0000 by Rhina Brackett, RN  Anti-Embolism Intervention: (low risk) Other (Comment)  Taken 11/08/2022 2200 by Rhina Brackett, RN  Anti-Embolism Intervention: (low risk) Other (Comment)  Taken 11/08/2022 2000 by Rhina Brackett, RN  Anti-Embolism Intervention: (low risk) Other (Comment)  Goal: Optimal Comfort and Wellbeing  Outcome: Progressing  Goal: Readiness for Transition of Care  Outcome: Progressing  Goal: Rounds/Family Conference  Outcome: Progressing     Problem: Infection  Goal: Absence of Infection Signs and Symptoms  Outcome: Progressing  Intervention: Prevent or Manage Infection  Recent Flowsheet Documentation  Taken 11/08/2022 2000 by Rhina Brackett, RN  Isolation Precautions: contact precautions maintained     Problem: Fall Injury Risk  Goal: Absence of Fall and Fall-Related Injury  Outcome: Progressing  Intervention: Promote Injury-Free Environment  Recent Flowsheet Documentation  Taken 11/08/2022 2000 by Rhina Brackett, RN  Safety Interventions:   fall reduction program maintained   low bed     Problem: Pain Acute  Goal: Optimal Pain Control and Function  Outcome: Progressing     Problem: Skin Injury Risk Increased  Goal: Skin Health and Integrity  Outcome: Progressing  Intervention: Optimize Skin Protection  Recent Flowsheet Documentation  Taken 11/09/2022 0200 by Rhina Brackett, RN  Pressure Reduction Techniques: frequent weight shift encouraged  Taken 11/09/2022 0000 by Rhina Brackett, RN  Pressure Reduction Techniques: frequent weight shift encouraged  Taken 11/08/2022 2200 by Rhina Brackett, RN  Pressure Reduction Techniques: frequent weight shift encouraged  Taken 11/08/2022 2100 by Rhina Brackett, RN  Pressure Reduction Techniques: frequent weight shift encouraged  Taken 11/08/2022 2000 by Rhina Brackett, RN  Pressure Reduction Techniques: frequent weight shift encouraged     Problem: Gas Exchange Impaired  Goal: Optimal Gas Exchange  Outcome: Progressing

## 2022-11-09 NOTE — Unmapped (Signed)
Care Management  Initial Transition Planning Assessment    CM met with patient in pt room.  Pt/visitors were not wearing hospital provided masks for the duration of the interaction.   CM was wearing hospital provided surgical mask.  CM was not within 6 foot of the patient/visitors during this interaction.     Type of Residence: Mailing Address:  [address]  [place] [address]  Contacts: Accompanied by: Family member  Patient Phone Number: [phone] (home)           Medical Provider(s): Irving Copas, MD  Reason for Admission: Admitting Diagnosis:  Bronchiectasis exacerbation  Past Medical History:   has a past medical history of Abscess of lung (CMS-HCC) (11/06/2010), Arthritis, Biceps tendon tear (2013), Bronchiectasis (CMS-HCC), Degenerative joint disease of left acromioclavicular joint, GERD (gastroesophageal reflux disease), Hypertension, Obstructive sleep apnea on CPAP (03/14/13), Pneumonia (2012), PONV (postoperative nausea and vomiting), Prostate cancer (CMS-HCC) (09/09/2017), Rotator cuff injury (left), and Vertigo.  Past Surgical History:   has a past surgical history that includes Back surgery; Tonsillectomy (1965); Appendectomy (1969); pr gerd tst w/ mucos impede electrod,>1hr (N/A, 07/06/2012); Lung removal, partial (Right, 10/2010); Bronchoscopy (10/19/2010); Neck surgery (1993, 1999, 2002); Carpal tunnel release (1999); Meniscectomy (2011); pr thoracotomy w/therap wedge resexn addl ipsilatrl (Right, 08/07/2013); pr open treat rib fracture w/int fixation, unilateral, 1-2 ribs (Right, 03/19/2015); and IR Embolization Hemorrhage Art Or Ven  Lymphatic Extravasation (07/11/2021).   Previous admit date: 11/17/2020    Primary Insurance- Payor: Manufacturing engineer / Plan: HEALTH TEAM ADVANTAGE / Product Type: *No Product type* /   Secondary Insurance - None  Prescription Coverage -   Preferred Pharmacy - [facility] [place], [place] - [address]  [facility] [place], [place] - [address]  [facility] [place] - [address]  [id number] [place] - [address]  [facility] [place] - [address]  [id number] - [place], [place] - [address] AT [address]    Transportation home: Education administrator assessed the patient by : In person interview with patient  Orientation Level: Oriented X4  Functional level prior to admission: Independent  Reason for referral: Discharge Planning, Home Health    Contact/Decision Maker  Extended Emergency Contact Information  Primary Emergency Contact: Jordan [name]  Address: [address]           [place], [place] [address] [place]  Home Phone: [phone]  Mobile Phone: [phone]  Relation: Spouse    Legal Next of Kin / Guardian / POA / Advance Directives     HCDM (HCPOA): Karmell, [place] - Spouse - [phone]    Advance Directive (Medical Treatment)  Does patient have an advance directive covering medical treatment?: Patient has advance directive covering medical treatment, copy not in chart.  Advance directive covering medical treatment not in Chart:: Copy requested from family    Health Care Decision Maker [HCDM] (Medical & Mental Health Treatment)  Healthcare Decision Maker: HCDM documented in the HCDM/Contact Info section.  Information offered on HCDM, Medical & Mental Health advance directives:: Patient declined information.    Advance Directive (Mental Health  Treatment)  Does patient have an advance directive covering mental health treatment?: Patient does not have advance directive covering mental health treatment.  Reason patient does not have an advance directive covering mental health treatment:: HCDM documented in the HCDM/Contact Info section.    Readmission Information    Have you been hospitalized in the last 30 days?: No        Patient Information  Lives with: Spouse/significant other    Type of Residence: Private residence             Support Systems/Concerns: Spouse, Family Members, Friends/Neighbors    Responsibilities/Dependents at home?: No    Home Care services in place prior to admission?: No          Outpatient/Community Resources in place prior to admission: Clinic       Equipment Currently Used at Home: oxygen, respiratory supplies  Current HME Agency (Name/Phone #): [facility] [place] provided via [employer]    Currently receiving outpatient dialysis?: No       Financial Information       Need for financial assistance?: No       Social Determinants of Health  Social Determinants of Health were addressed in provider documentation.  Please refer to patient history.  Social Determinants of Health     Financial Resource Strain: Low Risk  (11/09/2022)    Overall Financial Resource Strain (CARDIA)     Difficulty of Paying Living Expenses: Not hard at all   Internet Connectivity: Not on file   Food Insecurity: No Food Insecurity (11/09/2022)    Hunger Vital Sign     Worried About Running Out of Food in the Last Year: Never true     Ran Out of Food in the Last Year: Never true   Tobacco Use: Low Risk  (10/23/2022)    Patient History     Smoking Tobacco Use: Never     Smokeless Tobacco Use: Never     Passive Exposure: Not on file   Housing/Utilities: Low Risk  (11/09/2022)    Housing/Utilities     Within the past 12 months, have you ever stayed: outside, in a car, in a tent, in an overnight shelter, or temporarily in someone else's home (i.e. couch-surfing)?: No     Are you worried about losing your housing?: No     Within the past 12 months, have you been unable to get utilities (heat, electricity) when it was really needed?: No   Alcohol Use: Not At Risk (01/22/2021)    Received from [facility], [facility]    AUDIT-C     Frequency of Alcohol Consumption: Never     Average Number of Drinks: Patient does not drink     Frequency of Binge Drinking: Never   Transportation Needs: No Transportation Needs (11/09/2022)    PRAPARE - Transportation     Lack of Transportation (Medical): No     Lack of Transportation (Non-Medical): No   Substance Use: Not on file   Health Literacy: Not on file   Physical Activity: Inactive (01/22/2021)    [facility] from [facility], [facility]    Exercise Vital Sign     Days of Exercise per Week: 0 days     Minutes of Exercise per Session: 0 min   Interpersonal Safety: Unknown (11/09/2022)    Interpersonal Safety     Unsafe Where You Currently Live: Not on file     Physically Hurt by Anyone: Not on file     Abused by  Anyone: Not on file   Stress: No Stress Concern Present (01/22/2021)    Received from [facility], [facility] - Occupational Stress Questionnaire     Feeling of Stress : Not at all   Intimate Partner Violence: Unknown (06/24/2021)    Received from [facility], [facility]    HITS     Physically Hurt: Not on file     Insult or Talk Down To: Not on file     Threaten Physical Harm: Not on file     Scream or Curse: Not on file   Depression: Not at risk (06/26/2020)    Received from [facility], [facility]    Depression     Depression Screening: 0   Social Connections: Unknown (08/01/2021)    Received from [facility], [facility]    Social Network     Social Network: Not on file       Complex Discharge Information    Is patient identified as a difficult/complex discharge?: No                                                               Interventions:       Discharge Needs Assessment  Concerns to be Addressed: discharge planning    Clinical Risk Factors: > 65, Multiple Diagnoses (Chronic), New Diagnosis    Barriers to taking medications: No    Prior overnight hospital stay or ED visit in last 90 days: No              Anticipated Changes Related to Illness: none    Equipment Needed After Discharge: none    Discharge Facility/Level of Care Needs: other (see comments) (Home with Home Infusion)    Readmission  Risk of Unplanned Readmission Score: UNPLANNED READMISSION SCORE: 9.39%  Predictive Model Details          9% (Low)  Factor Value    Calculated 11/09/2022 12:03 47% Number of active inpatient medication orders 38    [place] Risk of Unplanned Readmission Model 12% Imaging order present in last 6 months     11% Latest hemoglobin low (12.3 g/dL)     16% Age 64     8% Diagnosis of deficiency anemia present     4% Charlson Comorbidity Index 2     4% Current length of stay 2.046 days     3% Future appointment scheduled     2% Active ulcer inpatient medication order present      Readmitted Within the Last 30 Days? (No if blank)   Patient at risk for readmission?: No    Discharge Plan  Screen findings are: Discharge planning needs identified or anticipated (Comment). (Home with Home Infusion)    Expected Discharge Date: 11/11/2022    Expected Transfer from Critical Care:  (N/A)    Quality data for continuing care services shared with patient and/or representative?: Yes  Patient and/or family were provided with choice of facilities / services that are available and appropriate to meet post hospital care needs?: Yes   List choices in order highest to lowest preferred, if applicable. : [facility] for home infusion    Initial Assessment complete?: Yes

## 2022-11-09 NOTE — Unmapped (Signed)
PICC LINE INSERTION PROCEDURE NOTE    Indications:  Antibiotic Therapy and Long Term Therapy    Consent/Time Out:    Risks, benefits and alternatives discussed with patient.  Written Consent was obtained prior to the procedure and is detailed in the medical record.  Prior to the start of the procedure, a time out was taken and the identity of the patient was confirmed via name, medical record number and  date of birth.  The availability of the correct equipment was verified.    Procedure Details:  The vein was identified and measured for appropriate catheter length. Maximum sterile techniques including PPE were utilized per protocol.  Sterile field prepared with necessary supplies and equipment. Insertion site was prepped with chlorhexidine solution and allowed to dry. Lidocaine 2 mL subcutaneously and intradermally administered to insertion site.  The catheter was primed with normal saline.  A 4 FR Single lumen was inserted to the R Basilic vein with 1 insertion attempt(s).  Catheter aspirated, 3 mL blood return present.  The catheter was then flushed with 10 mL of normal saline.   Insertion site cleansed, and sterile dressing applied per manufacturer guidelines. The Central Line Checklist was referenced.  The catheter was inserted without difficulty by [place] Elleanor Guyett, BS, BSN, VA-BC.    Findings:  Manufacturer:  [employer]  Lot #:  [id number]  CT Injectable (power):  Yes  Arm Circumference:  28.5  cm  Total catheter length:  42 cm.    External length:  0 cm.  Catheter trimmed:  yes  Port reserved:  No    Vein Size:   Right arm basilic vein compressible:  Yes, measurement:   (see image)    Tip Placement Verification:   [employer] and [employer]lock    Workup Time:    60 minutes    Recommendations:  PICC brochure given to patient with teaching instructions  Refer to head of bed sign      See images below:

## 2022-11-10 DIAGNOSIS — J9611 Chronic respiratory failure with hypoxia: Secondary | ICD-10-CM | POA: Diagnosis not present

## 2022-11-10 DIAGNOSIS — J9621 Acute and chronic respiratory failure with hypoxia: Secondary | ICD-10-CM | POA: Diagnosis not present

## 2022-11-10 DIAGNOSIS — J471 Bronchiectasis with (acute) exacerbation: Secondary | ICD-10-CM | POA: Diagnosis not present

## 2022-11-10 DIAGNOSIS — I10 Essential (primary) hypertension: Secondary | ICD-10-CM | POA: Diagnosis not present

## 2022-11-10 DIAGNOSIS — B965 Pseudomonas (aeruginosa) (mallei) (pseudomallei) as the cause of diseases classified elsewhere: Secondary | ICD-10-CM | POA: Diagnosis not present

## 2022-11-10 DIAGNOSIS — K5900 Constipation, unspecified: Secondary | ICD-10-CM | POA: Diagnosis not present

## 2022-11-10 LAB — BASIC METABOLIC PANEL
ANION GAP: 2 mmol/L — ABNORMAL LOW (ref 5–14)
BLOOD UREA NITROGEN: 17 mg/dL (ref 9–23)
BUN / CREAT RATIO: 16
CALCIUM: 9.3 mg/dL (ref 8.7–10.4)
CHLORIDE: 107 mmol/L (ref 98–107)
CO2: 31.2 mmol/L — ABNORMAL HIGH (ref 20.0–31.0)
CREATININE: 1.04 mg/dL
EGFR CKD-EPI (2021) MALE: 78 mL/min/{1.73_m2} (ref >=60)
GLUCOSE RANDOM: 94 mg/dL (ref 70–179)
POTASSIUM: 3.7 mmol/L (ref 3.4–4.8)
SODIUM: 140 mmol/L (ref 135–145)

## 2022-11-10 LAB — CBC
HEMATOCRIT: 37.6 % — ABNORMAL LOW (ref 39.0–48.0)
HEMOGLOBIN: 12.7 g/dL — ABNORMAL LOW (ref 12.9–16.5)
MEAN CORPUSCULAR HEMOGLOBIN CONC: 33.8 g/dL (ref 32.0–36.0)
MEAN CORPUSCULAR HEMOGLOBIN: 31.4 pg (ref 25.9–32.4)
MEAN CORPUSCULAR VOLUME: 92.7 fL (ref 77.6–95.7)
MEAN PLATELET VOLUME: 8 fL (ref 6.8–10.7)
PLATELET COUNT: 167 10*9/L (ref 150–450)
RED BLOOD CELL COUNT: 4.06 10*12/L — ABNORMAL LOW (ref 4.26–5.60)
RED CELL DISTRIBUTION WIDTH: 12.7 % (ref 12.2–15.2)
WBC ADJUSTED: 6.9 10*9/L (ref 3.6–11.2)

## 2022-11-10 MED ADMIN — ciprofloxacin HCl (CIPRO) tablet 750 mg: 750 mg | ORAL | @ 10:00:00 | Stop: 2022-11-14

## 2022-11-10 MED ADMIN — tobramycin (PF) (TOBI) 300 mg/5 mL nebulizer solution 300 mg: 300 mg | RESPIRATORY_TRACT | Stop: 2022-11-14

## 2022-11-10 MED ADMIN — tobramycin (PF) (TOBI) 300 mg/5 mL nebulizer solution 300 mg: 300 mg | RESPIRATORY_TRACT | @ 11:00:00 | Stop: 2022-11-14

## 2022-11-10 MED ADMIN — piperacillin-tazobactam (ZOSYN) IVPB (premix) 4.5 g: 4.5 g | INTRAVENOUS | @ 16:00:00 | Stop: 2022-11-14

## 2022-11-10 MED ADMIN — sodium chloride (NS) 0.9 % flush 10 mL: 10 mL | INTRAVENOUS | @ 08:00:00

## 2022-11-10 MED ADMIN — dilTIAZem (CARDIZEM CD) 24 hr capsule 120 mg: 120 mg | ORAL | @ 13:00:00

## 2022-11-10 MED ADMIN — azithromycin (ZITHROMAX) tablet 250 mg: 250 mg | ORAL | @ 13:00:00 | Stop: 2022-11-15

## 2022-11-10 MED ADMIN — budesonide (PULMICORT) nebulizer solution 0.5 mg: .5 mg | RESPIRATORY_TRACT | @ 11:00:00

## 2022-11-10 MED ADMIN — arformoterol (BROVANA) nebulizer solution 15 mcg/2 mL: 15 ug | RESPIRATORY_TRACT | @ 11:00:00

## 2022-11-10 MED ADMIN — pantoprazole (Protonix) EC tablet 20 mg: 20 mg | ORAL | @ 13:00:00

## 2022-11-10 MED ADMIN — multivitamin with folic acid 400 mcg tablet 1 tablet: 1 | ORAL | @ 13:00:00

## 2022-11-10 MED ADMIN — calcium carbonate tablet 600 mg elem calcium: 600 mg | ORAL | @ 02:00:00

## 2022-11-10 MED ADMIN — ascorbic acid (vitamin C) (VITAMIN C) tablet 1,000 mg: 1000 mg | ORAL | @ 13:00:00

## 2022-11-10 MED ADMIN — sodium chloride 10 % NEBULIZER solution 5 mL: 5 mL | RESPIRATORY_TRACT | @ 01:00:00

## 2022-11-10 MED ADMIN — arformoterol (BROVANA) nebulizer solution 15 mcg/2 mL: 15 ug | RESPIRATORY_TRACT

## 2022-11-10 MED ADMIN — fluticasone propionate (FLONASE) 50 mcg/actuation nasal spray 1 spray: 1 | NASAL | @ 13:00:00

## 2022-11-10 MED ADMIN — sodium chloride 10 % NEBULIZER solution 5 mL: 5 mL | RESPIRATORY_TRACT

## 2022-11-10 MED ADMIN — sodium chloride (NS) 0.9 % flush 10 mL: 10 mL | INTRAVENOUS | @ 16:00:00

## 2022-11-10 MED ADMIN — piperacillin-tazobactam (ZOSYN) IVPB (premix) 4.5 g: 4.5 g | INTRAVENOUS | @ 10:00:00 | Stop: 2022-11-14

## 2022-11-10 MED ADMIN — HYDROcodone-acetaminophen (NORCO) 5-325 mg per tablet 1.5 tablet: 1.5 | ORAL | @ 10:00:00 | Stop: 2022-11-21

## 2022-11-10 MED ADMIN — ipratropium-albuterol (DUO-NEB) 0.5-2.5 mg/3 mL nebulizer solution 3 mL: 3 mL | RESPIRATORY_TRACT | @ 01:00:00

## 2022-11-10 MED ADMIN — ipratropium-albuterol (DUO-NEB) 0.5-2.5 mg/3 mL nebulizer solution 3 mL: 3 mL | RESPIRATORY_TRACT | @ 19:00:00

## 2022-11-10 MED ADMIN — arformoterol (BROVANA) nebulizer solution 15 mcg/2 mL: 15 ug | RESPIRATORY_TRACT | @ 01:00:00

## 2022-11-10 MED ADMIN — HYDROcodone-acetaminophen (NORCO) 5-325 mg per tablet 1.5 tablet: 1.5 | ORAL | @ 02:00:00 | Stop: 2022-11-21

## 2022-11-10 MED ADMIN — HYDROcodone-acetaminophen (NORCO) 5-325 mg per tablet 1.5 tablet: 1.5 | ORAL | @ 18:00:00 | Stop: 2022-11-21

## 2022-11-10 MED ADMIN — sodium chloride 10 % NEBULIZER solution 5 mL: 5 mL | RESPIRATORY_TRACT | @ 19:00:00

## 2022-11-10 MED ADMIN — dornase alfa (PULMOZYME) 1 mg/mL solution 2.5 mg: 2.5 mg | RESPIRATORY_TRACT | @ 11:00:00

## 2022-11-10 MED ADMIN — calcium carbonate tablet 600 mg elem calcium: 600 mg | ORAL | @ 13:00:00

## 2022-11-10 MED ADMIN — sodium chloride 10 % NEBULIZER solution 5 mL: 5 mL | RESPIRATORY_TRACT | @ 15:00:00

## 2022-11-10 MED ADMIN — piperacillin-tazobactam (ZOSYN) IVPB (premix) 4.5 g: 4.5 g | INTRAVENOUS | @ 22:00:00 | Stop: 2022-11-14

## 2022-11-10 MED ADMIN — ipratropium-albuterol (DUO-NEB) 0.5-2.5 mg/3 mL nebulizer solution 3 mL: 3 mL | RESPIRATORY_TRACT | @ 15:00:00

## 2022-11-10 MED ADMIN — tobramycin (PF) (TOBI) 300 mg/5 mL nebulizer solution 300 mg: 300 mg | RESPIRATORY_TRACT | @ 01:00:00 | Stop: 2022-11-14

## 2022-11-10 MED ADMIN — budesonide (PULMICORT) nebulizer solution 0.5 mg: .5 mg | RESPIRATORY_TRACT | @ 01:00:00

## 2022-11-10 MED ADMIN — atorvastatin (LIPITOR) tablet 10 mg: 10 mg | ORAL | @ 13:00:00

## 2022-11-10 MED ADMIN — guaiFENesin (ROBITUSSIN) oral syrup: 200 mg | ORAL | @ 04:00:00

## 2022-11-10 MED ADMIN — montelukast (SINGULAIR) tablet 10 mg: 10 mg | ORAL | @ 02:00:00

## 2022-11-10 MED ADMIN — piperacillin-tazobactam (ZOSYN) IVPB (premix) 4.5 g: 4.5 g | INTRAVENOUS | @ 04:00:00 | Stop: 2022-11-14

## 2022-11-10 MED ADMIN — ipratropium-albuterol (DUO-NEB) 0.5-2.5 mg/3 mL nebulizer solution 3 mL: 3 mL | RESPIRATORY_TRACT | @ 11:00:00

## 2022-11-10 MED ADMIN — ciprofloxacin HCl (CIPRO) tablet 750 mg: 750 mg | ORAL | @ 22:00:00 | Stop: 2022-11-14

## 2022-11-10 MED ADMIN — sodium chloride 10 % NEBULIZER solution 5 mL: 5 mL | RESPIRATORY_TRACT | @ 11:00:00

## 2022-11-10 MED ADMIN — budesonide (PULMICORT) nebulizer solution 0.5 mg: .5 mg | RESPIRATORY_TRACT

## 2022-11-10 MED ADMIN — ipratropium-albuterol (DUO-NEB) 0.5-2.5 mg/3 mL nebulizer solution 3 mL: 3 mL | RESPIRATORY_TRACT

## 2022-11-10 NOTE — Unmapped (Signed)
Patient remains on 3 lpm O2 nasal canula. All scheduled breathing treatments done. Metaneb and vibralung done for airway clearance. Patient has productive cough after breathing treatments

## 2022-11-10 NOTE — Unmapped (Signed)
Daily Progress Note    Assessment/Plan:    Principal Problem:    Acute exacerbation of bronchiectasis (CMS-HCC)  Active Problems:    Essential hypertension    Chronic respiratory failure with hypoxia (CMS-HCC)                 Jordan [name] is a 67 y.o. male who presented to [facility] with Acute exacerbation of bronchiectasis (CMS-HCC).    Acute exacerbation of bronchiectasis (CMS-HCC)   Acute on chronic hypoxemic respiratory failure  Follows with Dr. Garner Nash from Pulmonary Medicine for bronchiectasis. History of MDR Pseudomonas colonization. On 2L O2 via [place] at baseline. Presented with increasing dyspnea and production of purulent sputum over the last month. 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 Developed peripheral neuropathy possibly 2/2 voriconazole. Stable/improved off treatment. No complaints this morning. Ready for PICC placement tomorrow.    - pulmonology following  - Ciprofloxacin 750mg  po BID, Zosyn IV, Tobramycin nebs  - continue home Azithromycin daily for anti-inflammatory affect  - Singulair qhs   - hold home trelegy (hasn't been able to start this due to insurance/financial issues)  - Arformoterol BID  - Budesonide 0.5mg  BID  - Duonebs QID (pre-treatment for hypertonic saline)  - airway clearance QID (RT)              - Hypertonic saline 10% nebs              - mechanical clearance: Metaneb and Vibralung              - Daily Pulmozyme  - PICC line for anticipated 2-3 weeks of abx; ordered  - CRP and ESR weekly; continue on discharge  - EKG on hospital day 3 for QT monitoring on Cipro and Azithro was normal   - sputum culture with 1+ mucoid Pseudomonas and AFB culture pending  - continuous O2 monitoring  - follow-up appointment with me scheduled for 01/26/23   - see pulm consult note for dispo recommendations     Essential hypertension - Aortic Root Dilation   BP acceptable  - Diltiazem 120 every day  - Atorvastatin 10 qhs      Constipation  - Colace qhs, as per his request.   - monitor for BMs and intensify if needed     GERD  - Pantoprazole     DVT prophylaxis  - pt is ambulatory       Code Status / HCDM   -Full Code, Discussed with patient at the time of admission   -  HCDM (HCPOA): Yandell, [place] Spouse - [phone]     Disposition: Floor. DC to home after approximately 1 week hospitalization.       Please page the [facility]) pager at [phone] with questions.  __________________________________________________________________    Subjective:  Reports an episode of prolonged coughing spell overnight.  DOE but no cp.  Rusty colored sputum without any hemoptysis.  Tolerating airway clearance therapy.    Labs/Studies:  Recent Results (from the past 24 hour(s))   Basic metabolic panel    Collection Time: 11/10/22  5:57 AM   Result Value Ref Range    Sodium 140 135 - 145 mmol/L    Potassium 3.7 3.4 - 4.8 mmol/L    Chloride 107 98 - 107 mmol/L    CO2 31.2 (H) 20.0 - 31.0 mmol/L    Anion Gap 2 (L) 5 - 14 mmol/L  BUN 17 9 - 23 mg/dL    Creatinine 1.61 0.96 - 1.18 mg/dL    BUN/Creatinine Ratio 16     eGFR CKD-EPI (2021) Male 77 >=60 mL/min/1.4m2    Glucose 94 70 - 179 mg/dL    Calcium 9.3 8.7 - 04.5 mg/dL   CBC    Collection Time: 11/10/22  5:57 AM   Result Value Ref Range    WBC 6.9 3.6 - 11.2 10*9/L    RBC 4.06 (L) 4.26 - 5.60 10*12/L    HGB 12.7 (L) 12.9 - 16.5 g/dL    HCT 40.9 (L) 81.1 - 48.0 %    MCV 92.7 77.6 - 95.7 fL    MCH 31.4 25.9 - 32.4 pg    MCHC 33.8 32.0 - 36.0 g/dL    RDW 91.4 78.2 - 95.6 %    MPV 8.0 6.8 - 10.7 fL    Platelet 167 150 - 450 10*9/L   ECG 12 Lead    Collection Time: 11/10/22 10:11 AM   Result Value Ref Range    EKG Systolic BP  mmHg    EKG Diastolic BP  mmHg    EKG Ventricular Rate 86 BPM    EKG Atrial Rate 86 BPM    EKG P-R Interval 198 ms    EKG QRS Duration 94 ms    EKG Q-T Interval 390 ms    EKG QTC Calculation 466 ms    EKG Calculated P Axis 59 degrees    EKG Calculated R Axis -62 degrees    EKG Calculated T Axis 54 degrees    QTC Fredericia 440 ms     Objective:  Temp:  [36.5 ??C (97.7 ??F)-36.7 ??C (98 ??F)] 36.6 ??C (97.9 ??F)  Heart Rate:  [78-96] 78  Resp:  [16-18] 17  BP: (127-137)/(66-70) 134/68  SpO2:  [81 %-99 %] 94 %    GEN: pleasant elderly male sitting in a chair in NAD  HEENT: EOMI/MMM  CV: RRR no m/r/g  PULM: no breath sounds in RLL/RML with scattered rhonchi in RUL.  Left lung clear   ABD: soft, NT/ND, +BS  EXT: No edema  NEURO: alert and oriented x 3. No focal neuro deficits.

## 2022-11-10 NOTE — Unmapped (Signed)
Pt. Complaining of moderate generalized pain. Norco administered as ordered and was effective. Breathing treatments administered by RT as ordered. O2 administered as ordered. Continuous pulsox in place as ordered. No calls from telemetry. Pt. Requested robitussin for cough, which was effective. Tolerating food and fluids. Ambulating to the bathroom. Voiding. BM x 3 this shift. Alert and oriented x 4. IV antibiotics administered as ordered.  Single lumen PICC flushes well and has good blood return.  All safety measures maintained. Possible discharge 11/13/22.   Problem: Adult Inpatient Plan of Care  Goal: Plan of Care Review  Outcome: Progressing  Flowsheets (Taken 11/10/2022 0202)  Progress: improving  Outcome Evaluation: discharge home with home health  Plan of Care Reviewed With: patient  Goal: Patient-Specific Goal (Individualized)  Outcome: Progressing  Flowsheets (Taken 11/10/2022 0202)  Patient/Family-Specific Goals (Include Timeframe): Pt. will have tolerable pain from 7p-7a.  Individualized Care Needs: Breathing treatments, IV antibiotics, O2, pain control, PICC line care, contact precautions, monitor labs/VS  Anxieties, Fears or Concerns: Wants to go home  Goal: Absence of Hospital-Acquired Illness or Injury  Outcome: Progressing  Intervention: Identify and Manage Fall Risk  Recent Flowsheet Documentation  Taken 11/09/2022 2000 by Rhina Brackett, RN  Safety Interventions:   fall reduction program maintained   low bed  Intervention: Prevent Skin Injury  Recent Flowsheet Documentation  Taken 11/10/2022 0200 by Rhina Brackett, RN  Positioning for Skin: Supine/Back  Taken 11/10/2022 0000 by Rhina Brackett, RN  Positioning for Skin: Supine/Back  Taken 11/09/2022 2200 by Rhina Brackett, RN  Positioning for Skin: Supine/Back  Taken 11/09/2022 2130 by Rhina Brackett, RN  Positioning for Skin: Sitting in Chair  Taken 11/09/2022 2000 by Rhina Brackett, RN  Positioning for Skin: Sitting in Chair  Intervention: Prevent and Manage VTE (Venous Thromboembolism) Risk  Recent Flowsheet Documentation  Taken 11/10/2022 0200 by Rhina Brackett, RN  Anti-Embolism Intervention: (low risk) Other (Comment)  Taken 11/10/2022 0000 by Rhina Brackett, RN  Anti-Embolism Intervention: (low risk) Other (Comment)  Taken 11/09/2022 2200 by Rhina Brackett, RN  Anti-Embolism Intervention: (low risk) Other (Comment)  Taken 11/09/2022 2000 by Rhina Brackett, RN  Anti-Embolism Intervention: (low risk) Other (Comment)  Goal: Optimal Comfort and Wellbeing  Outcome: Progressing  Goal: Readiness for Transition of Care  Outcome: Progressing  Goal: Rounds/Family Conference  Outcome: Progressing     Problem: Infection  Goal: Absence of Infection Signs and Symptoms  Outcome: Progressing  Intervention: Prevent or Manage Infection  Recent Flowsheet Documentation  Taken 11/09/2022 2000 by Rhina Brackett, RN  Isolation Precautions: contact precautions maintained     Problem: Fall Injury Risk  Goal: Absence of Fall and Fall-Related Injury  Outcome: Progressing  Intervention: Promote Injury-Free Environment  Recent Flowsheet Documentation  Taken 11/09/2022 2000 by Rhina Brackett, RN  Safety Interventions:   fall reduction program maintained   low bed     Problem: Pain Acute  Goal: Optimal Pain Control and Function  Outcome: Progressing     Problem: Skin Injury Risk Increased  Goal: Skin Health and Integrity  Outcome: Progressing  Intervention: Optimize Skin Protection  Recent Flowsheet Documentation  Taken 11/10/2022 0200 by Rhina Brackett, RN  Pressure Reduction Techniques: frequent weight shift encouraged  Taken 11/10/2022 0000 by Rhina Brackett, RN  Pressure Reduction Techniques: frequent weight shift encouraged  Taken 11/09/2022 2200 by Rhina Brackett, RN  Pressure Reduction Techniques: frequent weight shift encouraged  Taken 11/09/2022 2130 by Kathy Breach,  Jodelle Red, RN  Pressure Reduction Techniques: frequent weight shift encouraged  Taken 11/09/2022 2000 by Rhina Brackett, RN  Pressure Reduction Techniques: frequent weight shift encouraged     Problem: Gas Exchange Impaired  Goal: Optimal Gas Exchange  Outcome: Progressing     Problem: Artificial Airway  Goal: Effective Communication  Outcome: Progressing  Goal: Optimal Device Function  Outcome: Progressing  Goal: Absence of Device-Related Skin or Tissue Injury  Outcome: Progressing     Problem: Airway Clearance Ineffective  Goal: Effective Airway Clearance  Outcome: Progressing

## 2022-11-10 NOTE — Unmapped (Signed)
Problem: Adult Inpatient Plan of Care  Goal: Plan of Care Review  Outcome: Ongoing - Unchanged  Goal: Patient-Specific Goal (Individualized)  Outcome: Ongoing - Unchanged     Problem: Airway Clearance Ineffective  Goal: Effective Airway Clearance  Outcome: Ongoing - Unchanged  Intervention: Promote Airway Secretion Clearance  Recent Flowsheet Documentation  Taken 11/09/2022 2110 by Sharlotte Alamo, RRT  Breathing Techniques/Airway Clearance: deep/controlled cough encouraged  Taken 11/09/2022 2049 by Sharlotte Alamo, RRT  Breathing Techniques/Airway Clearance: deep/controlled cough encouraged   Pt remained stable on this shift, scheduled breathing treatments given, Metaneb and vibralung airway clearance devices used without any adverse effects. Pt coughed out copious amounts of thick secretions post Metaneb and Vibralung airway clearance therapies.

## 2022-11-10 NOTE — Unmapped (Signed)
Pt A&Ox4. VSS. Breathing treatments done throughout the shift with RT. Pt up ab lib and ambulating to BR. Contact precautions maintained. Pain managed well with scheduled meds. No complaints of nausea. Tolerating diet well. Tele remains on and in place with no calls during shift. All safety measures remain in place. Call bell in reach. Will continue plan of care.   Problem: Adult Inpatient Plan of Care  Goal: Plan of Care Review  Outcome: Progressing  Goal: Patient-Specific Goal (Individualized)  Outcome: Progressing  Goal: Absence of Hospital-Acquired Illness or Injury  Outcome: Progressing  Intervention: Identify and Manage Fall Risk  Recent Flowsheet Documentation  Taken 11/10/2022 1600 by Charlann Lange, RN  Safety Interventions:   fall reduction program maintained   lighting adjusted for tasks/safety   low bed  Taken 11/10/2022 1400 by Charlann Lange, RN  Safety Interventions:   fall reduction program maintained   lighting adjusted for tasks/safety   low bed  Taken 11/10/2022 1200 by Charlann Lange, RN  Safety Interventions:   fall reduction program maintained   lighting adjusted for tasks/safety   low bed  Taken 11/10/2022 1000 by Charlann Lange, RN  Safety Interventions:   fall reduction program maintained   lighting adjusted for tasks/safety   low bed  Taken 11/10/2022 0800 by Charlann Lange, RN  Safety Interventions:   fall reduction program maintained   lighting adjusted for tasks/safety   low bed  Intervention: Prevent Skin Injury  Recent Flowsheet Documentation  Taken 11/10/2022 1600 by Charlann Lange, RN  Positioning for Skin: Supine/Back  Taken 11/10/2022 1400 by Charlann Lange, RN  Positioning for Skin: Supine/Back  Taken 11/10/2022 1200 by Charlann Lange, RN  Positioning for Skin: Supine/Back  Taken 11/10/2022 1000 by Charlann Lange, RN  Positioning for Skin: Supine/Back  Taken 11/10/2022 0900 by Charlann Lange, RN  Positioning for Skin: Sitting in Chair  Taken 11/10/2022 0800 by Charlann Lange, RN  Positioning for Skin: Supine/Back  Goal: Optimal Comfort and Wellbeing  Outcome: Progressing  Goal: Readiness for Transition of Care  Outcome: Progressing  Goal: Rounds/Family Conference  Outcome: Progressing     Problem: Infection  Goal: Absence of Infection Signs and Symptoms  Outcome: Progressing  Intervention: Prevent or Manage Infection  Recent Flowsheet Documentation  Taken 11/10/2022 0800 by Marylou Mccoy A, RN  Isolation Precautions: contact precautions maintained     Problem: Fall Injury Risk  Goal: Absence of Fall and Fall-Related Injury  Outcome: Progressing  Intervention: Promote Scientist, clinical (histocompatibility and immunogenetics) Documentation  Taken 11/10/2022 1600 by Charlann Lange, RN  Safety Interventions:   fall reduction program maintained   lighting adjusted for tasks/safety   low bed  Taken 11/10/2022 1400 by Charlann Lange, RN  Safety Interventions:   fall reduction program maintained   lighting adjusted for tasks/safety   low bed  Taken 11/10/2022 1200 by Charlann Lange, RN  Safety Interventions:   fall reduction program maintained   lighting adjusted for tasks/safety   low bed  Taken 11/10/2022 1000 by Charlann Lange, RN  Safety Interventions:   fall reduction program maintained   lighting adjusted for tasks/safety   low bed  Taken 11/10/2022 0800 by Charlann Lange, RN  Safety Interventions:   fall reduction program maintained   lighting adjusted for tasks/safety   low bed     Problem: Pain Acute  Goal: Optimal Pain Control and Function  Outcome: Progressing  Problem: Skin Injury Risk Increased  Goal: Skin Health and Integrity  Outcome: Progressing  Intervention: Optimize Skin Protection  Recent Flowsheet Documentation  Taken 11/10/2022 1600 by Marylou Mccoy A, RN  Pressure Reduction Techniques: frequent weight shift encouraged  Taken 11/10/2022 1400 by Marylou Mccoy A, RN  Pressure Reduction Techniques: frequent weight shift encouraged  Taken 11/10/2022 1200 by Marylou Mccoy A, RN  Pressure Reduction Techniques: frequent weight shift encouraged  Taken 11/10/2022 1000 by Marylou Mccoy A, RN  Pressure Reduction Techniques: frequent weight shift encouraged  Taken 11/10/2022 0900 by Marylou Mccoy A, RN  Pressure Reduction Techniques: frequent weight shift encouraged  Taken 11/10/2022 0800 by Marylou Mccoy A, RN  Pressure Reduction Techniques: frequent weight shift encouraged     Problem: Gas Exchange Impaired  Goal: Optimal Gas Exchange  Outcome: Progressing     Problem: Artificial Airway  Goal: Effective Communication  Outcome: Progressing  Goal: Optimal Device Function  Outcome: Progressing  Goal: Absence of Device-Related Skin or Tissue Injury  Outcome: Progressing     Problem: Airway Clearance Ineffective  Goal: Effective Airway Clearance  Outcome: Progressing

## 2022-11-10 NOTE — Unmapped (Signed)
General Pulmonary Team Follow Up Consult Note     Date of Service: 11/10/2022  Requesting Physician: Jacqualin Combes, MD   Requesting Service: Med Undesignated (MDX)  Reason for consultation: Comprehensive evaluation of  bronchiectasis exacerbation in the setting of chronic hypoxemic respiratory failure and PsA colonization .    Hospital Problems:  Principal Problem:    Acute exacerbation of bronchiectasis (CMS-HCC)  Active Problems:    Essential hypertension    Chronic respiratory failure with hypoxia (CMS-HCC)      HPI: Jordan [name] is a 67 y.o. male with bronchiectasis with chronic hypoxemic respiratory failure, Pseudomonas colonization, and prior history of lung resections.  I saw him in clinic on 10/23/2022 when he reported more frequent spells of chest tightness, wheezing, and difficulty expectorating sputum.  Based on this, we agreed to schedule an admission for IV antibiotics and aggressive airway clearance using alternative modalities to those that he uses at home.  No interval changes since I saw him in clinic aside from [place] and [place].  He had a good time.    Problems addressed during this consult include bronchiectasis exacerbation in the setting of chronic hypoxemic respiratory failure and Pseudomonas colonization. Based on these problems, the patient has moderate risk of morbidity/mortality which is commensurate w their risk of management options described below in the recommendations.    Assessment      Interval Events:  Had bad coughing spell last night after laying down in bed on left side.  Lasted for 10-15 minutes and almost caused him to vomit.  Eventually resolved with Robitussin.  Continues to bring up large volumes of sputum after airway clearance.       Impression: The patient has made minimal clinical progress since last encounter. I personally reviewed most recent pertinent labs, imaging and micro data, from 11/10/2022, which are noted in recommendations.         Recommendations   Antibiotics:  Based on prior culture data (OPF, PsA) and allergy profile, continue treating with Zosyn IV, Cipro 750 mg oral, and [name] nebs.  Single lumen PICC line placed for 2-3 weeks of IV antibiotics. Duration to be determined based on clinical response and improvement in inflammatory markers.  Continue chronic azithromycin for anti-inflammatory effect.     Other medications:  Trelegy converted to Duonebs (pre-treatment before hypertonic saline), budesonide 0.5 mg bid, and arformoterol 15 mcg bid.     Airway clearance: Needs aggressive airway clearance 4 times per day.  Nebulized hypertonic saline 10%; pre-treatment with albuterol/ipratropium.  For mechanical clearance, continue Metaneb and Vibralung. He will let me know if he likes the metaneb enough to pursue similar device for home use.  Continue daily Pulmozyme.  I suggested that he also try laying with right side up after nebulizing to facilitate drainage from his right upper lobe.     Labs:  Admission labs done. CBC-D notable for elevated absolute eosinophils to 0.5. CMP WNL.  CRP elevated to 12.0 and ESR elevated to 52.   Sputum for bacterial and AFB cultures (NTM screening) in process. Lower respiratory culture preliminary with 2+ OPF and 1+ mPsA.  Weekly CRP and ESR to trend (including as outpatient).  [facility] Guideline for Monitoring Outpatient Intravenous and Oral Antimicrobial Therapy recommended monitoring labs ([email or web address])     Imaging:  CXR as noted below.     Other Testing:  Patient does not need PFTs performed while in the hospital.  EKG after a few  days of Cipro and azithromycin - done today and WNL.     [facility]):  Patient is not a candidate for [facility] due to distance.     Dispo:   Patient has a follow-up appointment with me scheduled for 01/26/23.  Patient does not need to remain in the hospital for the entire course of IV antibiotics but I would prefer that he spend around a week in the hospital to intensify his airway clearance using alternative devices.   Please reach out to [facility] Bronchiectasis/NTM Care and [facility] team (myself, Alvino Blood RN, Eliberto Ivory CPP) prior to discharge to ensure that home infusion orders are correct and that we are comfortable with patient discharging home on IVs at that time.  We will follow monitoring labs.  Please use [email or web address] for home infusion orders. We will follow monitoring labs. Does not need OPAT.  Do not discharge Friday through Sunday or on a holiday as no outpatient clinical staff available to assist if problems arise with home health/home infusion.     The recommendations outlined in this note were discussed w the primary team direct (in-person). Please do not hesitate to page [phone] [facility] consult) with questions. We appreciate the opportunity to assist in the care of this patient. We look forward to following with you.    Attestation      I directly provided consultative services as documented in this note and I personally spent 40 minutes face-to-face and non face-to-face in the care of this patient, which includes all pre, intra and post visit time on the date of service including examining the patient, evaluating/interpreting objective clinical data, coordinating care with the entire multidisciplinary care team and developing a comprehensive management plan as outlined above. All documented time was specific to the E/M visit and does not include any procedures that may have been performed.      Viona Gilmore, MD    Subjective & Objective     Vitals - past 24 hours  Temp:  [36.5 ??C (97.7 ??F)-36.7 ??C (98 ??F)] 36.6 ??C (97.9 ??F)  Heart Rate:  [78-96] 78  Resp:  [16-18] 17  BP: (127-137)/(66-70) 134/68  SpO2:  [81 %-99 %] 94 % Intake/Output  I/O last 3 completed shifts:  In: 1035 [P.O.:835; IV Piggyback:200]  Out: 3 [Blood:3]      Pertinent exam findings:   General appearance - well appearing, well nourished, and in no distress  Eyes - EOMI, PERRLA, anicteric sclerae, pink conjunctiva  Mouth - moist mucous membranes, no pharyngeal erythema or exudates  Neck - Trachea midline, normal neck movement  Lymphatics - not examined  Heart - regular rate and rhythm, normal S1/S2, no gallops, rubs, or murmurs  Chest -absent breath sounds over the right middle and lower lobe, corresponding with prior resection.  Inspiratory and expiratory crackles over left mid and lower lung fields with extension to the mid axillary line - improved over left mid/upper lobe.  Easy work of breathing without accessory muscle use.  Speaking in full sentences without difficulty.  Abdomen - not examined  Extremities -trace pitting lower extremity edema extending about 2 fingerbreadths above the ankles.  Clubbing of fingers.  No cyanosis.  Skin - normal coloration and turgor, no rashes, no suspicious skin lesions noted  Neurological - alert, oriented, normal speech, no focal findings or movement disorder noted    Relevant Imaging:  - CXR on 11/07/2022 revealed stable architectural distortion, volume loss, and bronchiectasis in the  right upper lobe as well as stable extensive bronchiectasis and bronchial wall thickening of the left lung.    Arterial Blood Gas:   No results for input(s): SPECTYPEART, PHART, PCO2ART, PO2ART, HCO3ART, BEART, O2SATART in the last 24 hours.     Venous Blood Gas:   No results for input(s): PHVEN, PCO2VEN, PO2VEN, HCO3VEN, BEVEN, O2SATVEN in the last 24 hours.     Cultures:  Lower Respiratory Culture (no units)   Date Value   11/07/2022 1+ Mucoid Pseudomonas aeruginosa (A)   11/07/2022 2+ Oropharyngeal Flora Isolated     WBC (10*9/L)   Date Value   11/10/2022 6.9          Other Labs:  Lab Results   Component Value Date    WBC 6.9 11/10/2022    HGB 12.7 (L) 11/10/2022    HCT 37.6 (L) 11/10/2022    PLT 167 11/10/2022     Lab Results   Component Value Date    NA 140 11/10/2022    K 3.7 11/10/2022    CL 107 11/10/2022    CO2 31.2 (H) 11/10/2022    BUN 17 11/10/2022    CREATININE 1.04 11/10/2022    GLU 94 11/10/2022    CALCIUM 9.3 11/10/2022    MG 1.5 (L) 12/22/2021    PHOS 3.7 01/22/2016     Lab Results   Component Value Date    BILITOT 0.4 11/07/2022    BILIDIR 0.40 04/02/2015    PROT 6.8 11/07/2022    ALBUMIN 3.4 11/07/2022    ALT 12 11/07/2022    AST 13 11/07/2022    ALKPHOS 84 11/07/2022    GGT 64 06/21/2013     Lab Results   Component Value Date    LABPROT 10.4 08/03/2013    INR 0.95 04/14/2017    APTT 32.6 07/25/2014       Allergies & Home Medications   Personally reviewed in Epic    Continuous Infusions:       Scheduled Medications:    arformoterol  15 mcg Nebulization BID (RT)    ascorbic acid (vitamin C)  1,000 mg Oral Daily    atorvastatin  10 mg Oral Daily    azithromycin  250 mg Oral Daily    budesonide (PULMICORT) nebulizer solution  0.5 mg Nebulization BID (RT)    calcium carbonate  600 mg elem calcium Oral BID    ciprofloxacin HCl  750 mg Oral BID    dilTIAZem  120 mg Oral Daily    docusate sodium  100 mg Oral Nightly    dornase alfa  2.5 mg Inhalation Daily    fluticasone propionate  1 spray Each Nare Daily    HYDROcodone-acetaminophen  1.5 tablet Oral Q8H    ipratropium-albuterol  3 mL Nebulization 4x Daily (RT)    montelukast  10 mg Oral Nightly    multivitamins (ADULT)  1 tablet Oral Daily    pantoprazole  20 mg Oral Daily    piperacillin-tazobactam (ZOSYN) IV (intermittent)  4.5 g Intravenous Q6H    sodium chloride  10 mL Intravenous Q8H    sodium chloride  5 mL Nebulization 4x Daily (RT)    tobramycin (PF)  300 mg Nebulization BID (RT)       PRN medications:  aluminum-magnesium hydroxide-simethicone, guaiFENesin, melatonin, senna

## 2022-11-10 NOTE — Unmapped (Signed)
PICC line place at bedside today per orders with patient tolerating well. Pain controlled with scheduled medications as ordered. Voiding independently. Patient remains on 3 liter via nasal cannula maintaining O2 > 94% with no called from telemetry during this shift. Continuous pulse Ox remains in place per orders.      Problem: Adult Inpatient Plan of Care  Goal: Plan of Care Review  Outcome: Progressing  Goal: Patient-Specific Goal (Individualized)  Outcome: Progressing  Goal: Absence of Hospital-Acquired Illness or Injury  Outcome: Progressing  Intervention: Identify and Manage Fall Risk  Recent Flowsheet Documentation  Taken 11/09/2022 0940 by Michail Sermon I, RN  Safety Interventions:   fall reduction program maintained   low bed   nonskid shoes/slippers when out of bed  Intervention: Prevent Skin Injury  Recent Flowsheet Documentation  Taken 11/09/2022 0940 by Manning Charity, RN  Positioning for Skin: Sitting in Chair  Goal: Optimal Comfort and Wellbeing  Outcome: Progressing  Goal: Readiness for Transition of Care  Outcome: Progressing  Goal: Rounds/Family Conference  Outcome: Progressing     Problem: Infection  Goal: Absence of Infection Signs and Symptoms  Outcome: Progressing  Intervention: Prevent or Manage Infection  Recent Flowsheet Documentation  Taken 11/09/2022 0940 by Michail Sermon I, RN  Isolation Precautions: contact precautions maintained     Problem: Fall Injury Risk  Goal: Absence of Fall and Fall-Related Injury  Outcome: Progressing  Intervention: Promote Scientist, clinical (histocompatibility and immunogenetics) Documentation  Taken 11/09/2022 0940 by Michail Sermon I, RN  Safety Interventions:   fall reduction program maintained   low bed   nonskid shoes/slippers when out of bed     Problem: Pain Acute  Goal: Optimal Pain Control and Function  Outcome: Progressing     Problem: Skin Injury Risk Increased  Goal: Skin Health and Integrity  Outcome: Progressing  Intervention: Optimize Skin Protection  Recent Flowsheet Documentation  Taken 11/09/2022 0940 by Creig Landin I, RN  Pressure Reduction Techniques: frequent weight shift encouraged     Problem: Gas Exchange Impaired  Goal: Optimal Gas Exchange  Outcome: Progressing     Problem: Artificial Airway  Goal: Effective Communication  Outcome: Progressing  Goal: Optimal Device Function  Outcome: Progressing  Goal: Absence of Device-Related Skin or Tissue Injury  Outcome: Progressing     Problem: Airway Clearance Ineffective  Goal: Effective Airway Clearance  Outcome: Progressing

## 2022-11-11 DIAGNOSIS — K5900 Constipation, unspecified: Secondary | ICD-10-CM | POA: Diagnosis not present

## 2022-11-11 DIAGNOSIS — J471 Bronchiectasis with (acute) exacerbation: Secondary | ICD-10-CM | POA: Diagnosis not present

## 2022-11-11 DIAGNOSIS — J9611 Chronic respiratory failure with hypoxia: Secondary | ICD-10-CM | POA: Diagnosis not present

## 2022-11-11 DIAGNOSIS — I10 Essential (primary) hypertension: Secondary | ICD-10-CM | POA: Diagnosis not present

## 2022-11-11 DIAGNOSIS — B965 Pseudomonas (aeruginosa) (mallei) (pseudomallei) as the cause of diseases classified elsewhere: Secondary | ICD-10-CM | POA: Diagnosis not present

## 2022-11-11 DIAGNOSIS — J9621 Acute and chronic respiratory failure with hypoxia: Secondary | ICD-10-CM | POA: Diagnosis not present

## 2022-11-11 LAB — CBC
HEMATOCRIT: 37.1 % — ABNORMAL LOW (ref 39.0–48.0)
HEMOGLOBIN: 12.4 g/dL — ABNORMAL LOW (ref 12.9–16.5)
MEAN CORPUSCULAR HEMOGLOBIN CONC: 33.4 g/dL (ref 32.0–36.0)
MEAN CORPUSCULAR HEMOGLOBIN: 31.1 pg (ref 25.9–32.4)
MEAN CORPUSCULAR VOLUME: 93.1 fL (ref 77.6–95.7)
MEAN PLATELET VOLUME: 7.9 fL (ref 6.8–10.7)
PLATELET COUNT: 167 10*9/L (ref 150–450)
RED BLOOD CELL COUNT: 3.99 10*12/L — ABNORMAL LOW (ref 4.26–5.60)
RED CELL DISTRIBUTION WIDTH: 12.3 % (ref 12.2–15.2)
WBC ADJUSTED: 6.5 10*9/L (ref 3.6–11.2)

## 2022-11-11 LAB — BASIC METABOLIC PANEL
ANION GAP: 3 mmol/L — ABNORMAL LOW (ref 5–14)
BLOOD UREA NITROGEN: 18 mg/dL (ref 9–23)
BUN / CREAT RATIO: 18
CALCIUM: 9.2 mg/dL (ref 8.7–10.4)
CHLORIDE: 107 mmol/L (ref 98–107)
CO2: 30.6 mmol/L (ref 20.0–31.0)
CREATININE: 0.99 mg/dL
EGFR CKD-EPI (2021) MALE: 83 mL/min/{1.73_m2} (ref >=60)
GLUCOSE RANDOM: 90 mg/dL (ref 70–179)
POTASSIUM: 3.4 mmol/L (ref 3.4–4.8)
SODIUM: 141 mmol/L (ref 135–145)

## 2022-11-11 MED ADMIN — ipratropium-albuterol (DUO-NEB) 0.5-2.5 mg/3 mL nebulizer solution 3 mL: 3 mL | RESPIRATORY_TRACT | @ 11:00:00

## 2022-11-11 MED ADMIN — dilTIAZem (CARDIZEM CD) 24 hr capsule 120 mg: 120 mg | ORAL | @ 13:00:00

## 2022-11-11 MED ADMIN — ipratropium-albuterol (DUO-NEB) 0.5-2.5 mg/3 mL nebulizer solution 3 mL: 3 mL | RESPIRATORY_TRACT | @ 20:00:00

## 2022-11-11 MED ADMIN — montelukast (SINGULAIR) tablet 10 mg: 10 mg | ORAL | @ 02:00:00

## 2022-11-11 MED ADMIN — piperacillin-tazobactam (ZOSYN) IVPB (premix) 4.5 g: 4.5 g | INTRAVENOUS | @ 16:00:00 | Stop: 2022-11-18

## 2022-11-11 MED ADMIN — piperacillin-tazobactam (ZOSYN) IVPB (premix) 4.5 g: 4.5 g | INTRAVENOUS | @ 04:00:00 | Stop: 2022-11-18

## 2022-11-11 MED ADMIN — fluticasone propionate (FLONASE) 50 mcg/actuation nasal spray 1 spray: 1 | NASAL | @ 13:00:00

## 2022-11-11 MED ADMIN — multivitamin with folic acid 400 mcg tablet 1 tablet: 1 | ORAL | @ 13:00:00

## 2022-11-11 MED ADMIN — dornase alfa (PULMOZYME) 1 mg/mL solution 2.5 mg: 2.5 mg | RESPIRATORY_TRACT | @ 11:00:00

## 2022-11-11 MED ADMIN — arformoterol (BROVANA) nebulizer solution 15 mcg/2 mL: 15 ug | RESPIRATORY_TRACT | @ 11:00:00

## 2022-11-11 MED ADMIN — azithromycin (ZITHROMAX) tablet 250 mg: 250 mg | ORAL | @ 13:00:00 | Stop: 2022-11-18

## 2022-11-11 MED ADMIN — HYDROcodone-acetaminophen (NORCO) 5-325 mg per tablet 1.5 tablet: 1.5 | ORAL | @ 20:00:00 | Stop: 2022-11-21

## 2022-11-11 MED ADMIN — ipratropium-albuterol (DUO-NEB) 0.5-2.5 mg/3 mL nebulizer solution 3 mL: 3 mL | RESPIRATORY_TRACT | @ 15:00:00

## 2022-11-11 MED ADMIN — tobramycin (PF) (TOBI) 300 mg/5 mL nebulizer solution 300 mg: 300 mg | RESPIRATORY_TRACT | @ 11:00:00 | Stop: 2022-11-18

## 2022-11-11 MED ADMIN — sodium chloride (NS) 0.9 % flush 10 mL: 10 mL | INTRAVENOUS | @ 03:00:00

## 2022-11-11 MED ADMIN — ciprofloxacin HCl (CIPRO) tablet 750 mg: 750 mg | ORAL | @ 22:00:00 | Stop: 2022-11-18

## 2022-11-11 MED ADMIN — sodium chloride (NS) 0.9 % flush 10 mL: 10 mL | INTRAVENOUS | @ 08:00:00

## 2022-11-11 MED ADMIN — ciprofloxacin HCl (CIPRO) tablet 750 mg: 750 mg | ORAL | @ 10:00:00 | Stop: 2022-11-18

## 2022-11-11 MED ADMIN — HYDROcodone-acetaminophen (NORCO) 5-325 mg per tablet 1.5 tablet: 1.5 | ORAL | @ 10:00:00 | Stop: 2022-11-21

## 2022-11-11 MED ADMIN — calcium carbonate tablet 600 mg elem calcium: 600 mg | ORAL | @ 02:00:00

## 2022-11-11 MED ADMIN — sodium chloride 10 % NEBULIZER solution 5 mL: 5 mL | RESPIRATORY_TRACT | @ 20:00:00

## 2022-11-11 MED ADMIN — piperacillin-tazobactam (ZOSYN) IVPB (premix) 4.5 g: 4.5 g | INTRAVENOUS | @ 10:00:00 | Stop: 2022-11-18

## 2022-11-11 MED ADMIN — budesonide (PULMICORT) nebulizer solution 0.5 mg: .5 mg | RESPIRATORY_TRACT | @ 11:00:00

## 2022-11-11 MED ADMIN — pantoprazole (Protonix) EC tablet 20 mg: 20 mg | ORAL | @ 13:00:00 | Stop: 2022-11-11

## 2022-11-11 MED ADMIN — ascorbic acid (vitamin C) (VITAMIN C) tablet 1,000 mg: 1000 mg | ORAL | @ 13:00:00

## 2022-11-11 MED ADMIN — sodium chloride 10 % NEBULIZER solution 5 mL: 5 mL | RESPIRATORY_TRACT | @ 11:00:00

## 2022-11-11 MED ADMIN — HYDROcodone-acetaminophen (NORCO) 5-325 mg per tablet 1.5 tablet: 1.5 | ORAL | @ 16:00:00 | Stop: 2022-11-11

## 2022-11-11 MED ADMIN — sodium chloride 10 % NEBULIZER solution 5 mL: 5 mL | RESPIRATORY_TRACT | @ 15:00:00

## 2022-11-11 MED ADMIN — piperacillin-tazobactam (ZOSYN) IVPB (premix) 4.5 g: 4.5 g | INTRAVENOUS | @ 22:00:00 | Stop: 2022-11-18

## 2022-11-11 MED ADMIN — guaiFENesin (ROBITUSSIN) oral syrup: 200 mg | ORAL | @ 02:00:00

## 2022-11-11 MED ADMIN — atorvastatin (LIPITOR) tablet 10 mg: 10 mg | ORAL | @ 13:00:00

## 2022-11-11 MED ADMIN — HYDROcodone-acetaminophen (NORCO) 5-325 mg per tablet 1.5 tablet: 1.5 | ORAL | @ 02:00:00 | Stop: 2022-11-21

## 2022-11-11 MED ADMIN — calcium carbonate tablet 600 mg elem calcium: 600 mg | ORAL | @ 13:00:00

## 2022-11-11 MED ADMIN — sodium chloride (NS) 0.9 % flush 10 mL: 10 mL | INTRAVENOUS | @ 16:00:00

## 2022-11-11 NOTE — Unmapped (Signed)
 Patient remains on 3 lpm O2 nasal canula. All scheduled breathing treatments done. Metaneb and vibralung done for airway clearance. Patient has productive cough after breathing treatments

## 2022-11-11 NOTE — Unmapped (Signed)
Problem: Airway Clearance Ineffective  Goal: Effective Airway Clearance  Outcome: Ongoing - Unchanged     Patient did well throughout shift. Treatments and AWC completed per order. Patient used both vibralung and metaneb for [facility], strong cough with moderate amount of thick secretions noted. No acute events of note at this time.

## 2022-11-11 NOTE — Unmapped (Signed)
General Pulmonary Team Follow Up Consult Note     Date of Service: 11/11/2022  Requesting Physician: Jacqualin Combes, MD   Requesting Service: Med Undesignated (MDX)  Reason for consultation: Comprehensive evaluation of  bronchiectasis exacerbation in the setting of chronic hypoxemic respiratory failure and PsA colonization .    Hospital Problems:  Principal Problem:    Acute exacerbation of bronchiectasis (CMS-HCC)  Active Problems:    Essential hypertension    Chronic respiratory failure with hypoxia (CMS-HCC)      HPI: Jordan [name] is a 67 y.o. male with bronchiectasis with chronic hypoxemic respiratory failure, Pseudomonas colonization, and prior history of lung resections.  I saw him in clinic on 10/23/2022 when he reported more frequent spells of chest tightness, wheezing, and difficulty expectorating sputum.  Based on this, we agreed to schedule an admission for IV antibiotics and aggressive airway clearance using alternative modalities to those that he uses at home.  No interval changes since I saw him in clinic aside [place].  He had a good time.    Problems addressed during this consult include bronchiectasis exacerbation in the setting of chronic hypoxemic respiratory failure and Pseudomonas colonization. Based on these problems, the patient has moderate risk of morbidity/mortality which is commensurate w their risk of management options described below in the recommendations.    Assessment      Interval Events:  Got up this morning with a cramp in his right calf.  Tried to stand and gently stretch it but felt 2 pops in the most distal aspect of the calf.  Is able to walk gingerly.  Chest does feel less congested than when he came in.  Is wondering if the intensity of the airway clearance devices was decreased because it does not feel as aggressive as early on in admission.       Impression: The patient has made significant clinical progress since last encounter. I personally reviewed most recent pertinent labs, imaging and micro data, from 11/11/2022, which are noted in recommendations.         Recommendations   Antibiotics:  Based on prior culture data (OPF, PsA) and allergy profile, continue treating with Zosyn IV, Cipro 750 mg oral, and [name] nebs.  Single lumen PICC line placed for 2-3 weeks of IV antibiotics. Duration to be determined based on clinical response and improvement in inflammatory markers.  Continue chronic azithromycin for anti-inflammatory effect.     Other medications:  Trelegy converted to Duonebs (pre-treatment before hypertonic saline), budesonide 0.5 mg bid, and arformoterol 15 mcg bid.     Airway clearance: Continue aggressive airway clearance 4 times per day.  Nebulized hypertonic saline 10%; pre-treatment with albuterol/ipratropium.  For mechanical clearance, continue Metaneb and Vibralung. He will let me know if he likes the metaneb enough to pursue similar device for home use.  Continue daily Pulmozyme.  I suggested that he also try laying with right side up after nebulizing to facilitate drainage from his right upper lobe.     Labs:  Admission labs done. CBC-D notable for elevated absolute eosinophils to 0.5. CMP WNL.  CRP elevated to 12.0 and ESR elevated to 52.   Sputum for bacterial and AFB cultures (NTM screening) in process. Lower respiratory culture preliminary with 2+ OPF and 1+ mPsA.  Weekly CRP and ESR to trend (including as outpatient).  [facility] Guideline for Monitoring Outpatient Intravenous and Oral Antimicrobial Therapy recommended monitoring labs ([email or web address]  Consult:  If he continues to have right calf pain, consider physical therapy consultation     Imaging:  CXR as noted below.     Other Testing:  Patient does not need PFTs performed while in the hospital.  EKG after a few days of Cipro and azithromycin - done today and WNL.     Dispo:   Patient has a follow-up appointment with me scheduled for 01/26/23.  Patient does not need to remain in the hospital for the entire course of IV antibiotics but I would prefer that he spend around a week in the hospital to intensify his airway clearance using alternative devices.   Please reach out to [facility] Bronchiectasis/NTM Care and [facility] team (myself, Alvino Blood RN, Eliberto Ivory CPP) prior to discharge to ensure that home infusion orders are correct and that we are comfortable with patient discharging home on IVs at that time.  We will follow monitoring labs.  Please use [email or web address] for home infusion orders. We will follow monitoring labs. Does not need OPAT.  Do not discharge Friday through Sunday or on a holiday as no outpatient clinical staff available to assist if problems arise with home health/home infusion.   I provided him a form for the pharmacy assistance program to assist with the co-pay cost of his Trelegy.    The recommendations outlined in this note were discussed w the primary team direct (in-person). Please do not hesitate to page [phone] [facility] consult) with questions. We appreciate the opportunity to assist in the care of this patient. We look forward to following with you.    Attestation      I directly provided consultative services as documented in this note and I personally spent 35 minutes face-to-face and non face-to-face in the care of this patient, which includes all pre, intra and post visit time on the date of service including examining the patient, evaluating/interpreting objective clinical data, coordinating care with the entire multidisciplinary care team and developing a comprehensive management plan as outlined above. All documented time was specific to the E/M visit and does not include any procedures that may have been performed.      Viona Gilmore, MD    Subjective & Objective     Vitals - past 24 hours  Temp:  [36.1 ??C (97 ??F)-36.4 ??C (97.6 ??F)] 36.1 ??C (97 ??F)  Heart Rate:  [68-88] 68  Resp:  [17-18] 18  BP: (118-133)/(66-76) 118/66  SpO2:  [93 %-98 %] 97 % Intake/Output  I/O last 3 completed shifts:  In: 360 [P.O.:360]  Out: -       Pertinent exam findings:   General appearance - well appearing, well nourished, and in no distress  Eyes - EOMI, PERRLA, anicteric sclerae, pink conjunctiva  Mouth - moist mucous membranes, no pharyngeal erythema or exudates  Neck - Trachea midline, normal neck movement  Lymphatics - not examined  Heart - regular rate and rhythm, normal S1/S2, no gallops, rubs, or murmurs  Chest -absent breath sounds over the right middle and lower lobe, corresponding with prior resection.  Inspiratory and expiratory crackles over left mid and lower lung fields with extension to the mid axillary line - improved over left mid/upper lobe.  Easy work of breathing without accessory muscle use.  Speaking in full sentences without difficulty.  Abdomen - not examined  Extremities -slight fullness of right calf extending down to the ankle.  Tender to palpation.  No bruising.  Clubbing of fingers.  No  cyanosis.  Skin - normal coloration and turgor, no rashes, no suspicious skin lesions noted  Neurological - alert, oriented, normal speech, no focal findings or movement disorder noted    Relevant Imaging:  - CXR on 11/07/2022 revealed stable architectural distortion, volume loss, and bronchiectasis in the right upper lobe as well as stable extensive bronchiectasis and bronchial wall thickening of the left lung.    Arterial Blood Gas:   No results for input(s): SPECTYPEART, PHART, PCO2ART, PO2ART, HCO3ART, BEART, O2SATART in the last 24 hours.     Venous Blood Gas:   No results for input(s): PHVEN, PCO2VEN, PO2VEN, HCO3VEN, BEVEN, O2SATVEN in the last 24 hours.     Cultures:  Lower Respiratory Culture (no units)   Date Value   11/07/2022 1+ Mucoid Pseudomonas aeruginosa (A)   11/07/2022 2+ Oropharyngeal Flora Isolated     WBC (10*9/L)   Date Value   11/11/2022 6.5          Other Labs:  Lab Results   Component Value Date    WBC 6.5 11/11/2022    HGB 12.4 (L) 11/11/2022    HCT 37.1 (L) 11/11/2022    PLT 167 11/11/2022     Lab Results   Component Value Date    NA 141 11/11/2022    K 3.4 11/11/2022    CL 107 11/11/2022    CO2 30.6 11/11/2022    BUN 18 11/11/2022    CREATININE 0.99 11/11/2022    GLU 90 11/11/2022    CALCIUM 9.2 11/11/2022    MG 1.5 (L) 12/22/2021    PHOS 3.7 01/22/2016     Lab Results   Component Value Date    BILITOT 0.4 11/07/2022    BILIDIR 0.40 04/02/2015    PROT 6.8 11/07/2022    ALBUMIN 3.4 11/07/2022    ALT 12 11/07/2022    AST 13 11/07/2022    ALKPHOS 84 11/07/2022    GGT 64 06/21/2013     Lab Results   Component Value Date    LABPROT 10.4 08/03/2013    INR 0.95 04/14/2017    APTT 32.6 07/25/2014       Allergies & Home Medications   Personally reviewed in Epic    Continuous Infusions:       Scheduled Medications:    arformoterol  15 mcg Nebulization BID (RT)    ascorbic acid (vitamin C)  1,000 mg Oral Daily    atorvastatin  10 mg Oral Daily    azithromycin  250 mg Oral Daily    budesonide (PULMICORT) nebulizer solution  0.5 mg Nebulization BID (RT)    calcium carbonate  600 mg elem calcium Oral BID    ciprofloxacin HCl  750 mg Oral BID    dilTIAZem  120 mg Oral Daily    docusate sodium  100 mg Oral Nightly    dornase alfa  2.5 mg Inhalation Daily    fluticasone propionate  1 spray Each Nare Daily    HYDROcodone-acetaminophen  1.5 tablet Oral Q8H    ipratropium-albuterol  3 mL Nebulization 4x Daily (RT)    montelukast  10 mg Oral Nightly    multivitamins (ADULT)  1 tablet Oral Daily    pantoprazole  20 mg Oral BID    piperacillin-tazobactam (ZOSYN) IV (intermittent)  4.5 g Intravenous Q6H    sodium chloride  10 mL Intravenous Q8H    sodium chloride  5 mL Nebulization 4x Daily (RT)    tobramycin (PF)  300 mg Nebulization BID (RT)  PRN medications:  alum-mag-simeth, guaiFENesin, melatonin, senna

## 2022-11-11 NOTE — Unmapped (Signed)
Pt. Complaining of moderate generalized pain. Norco administered as ordered and is effective. O2 via [place] at 2 L.  Continuous pulsox as ordered. No calls from telemetry. Breathing treatments as ordered. Tolerating food and fluids. Ambulating to the bathroom.  Voiding. Frequent bowel movements. Hold bowel medications.  Alert and oriented x 4.  Single lumen PICC line flushes well with good blood return. IV antibiotics administered as ordered. All safety measures maintained.  Possible discharge 11/13/22.  Problem: Adult Inpatient Plan of Care  Goal: Plan of Care Review  Outcome: Progressing  Flowsheets (Taken 11/11/2022 0730)  Progress: improving  Outcome Evaluation: discharge home with home health  Plan of Care Reviewed With: patient  Goal: Patient-Specific Goal (Individualized)  Outcome: Progressing  Flowsheets (Taken 11/11/2022 0730)  Patient/Family-Specific Goals (Include Timeframe): Pt. will have tolerable pain from 7p-7a.  Individualized Care Needs: Breathig treatments, IV antibiotics, O2, pain control, PICC line care, contact precautions, monitor labs/VS  Anxieties, Fears or Concerns: Wants to go homr  Goal: Absence of Hospital-Acquired Illness or Injury  Outcome: Progressing  Intervention: Identify and Manage Fall Risk  Recent Flowsheet Documentation  Taken 11/11/2022 0400 by Rhina Brackett, RN  Safety Interventions:   fall reduction program maintained   low bed  Taken 11/10/2022 2000 by Rhina Brackett, RN  Safety Interventions:   fall reduction program maintained   low bed  Intervention: Prevent Skin Injury  Recent Flowsheet Documentation  Taken 11/11/2022 0600 by Rhina Brackett, RN  Positioning for Skin: Left  Taken 11/11/2022 0400 by Rhina Brackett, RN  Positioning for Skin: Supine/Back  Taken 11/11/2022 0200 by Rhina Brackett, RN  Positioning for Skin: Supine/Back  Taken 11/11/2022 0000 by Rhina Brackett, RN  Positioning for Skin: Left  Taken 11/10/2022 2230 by Rhina Brackett, RN  Positioning for Skin: Sitting in Chair  Taken 11/10/2022 2200 by Rhina Brackett, RN  Positioning for Skin: Sitting in Chair  Taken 11/10/2022 2000 by Rhina Brackett, RN  Positioning for Skin: Sitting in Chair  Intervention: Prevent and Manage VTE (Venous Thromboembolism) Risk  Recent Flowsheet Documentation  Taken 11/11/2022 0600 by Rhina Brackett, RN  Anti-Embolism Intervention: (low risk) Other (Comment)  Taken 11/11/2022 0400 by Rhina Brackett, RN  Anti-Embolism Intervention: (low risk) Other (Comment)  Taken 11/11/2022 0200 by Rhina Brackett, RN  Anti-Embolism Intervention: (low risk) Other (Comment)  Taken 11/11/2022 0000 by Rhina Brackett, RN  Anti-Embolism Intervention: (low risk) Other (Comment)  Taken 11/10/2022 2200 by Rhina Brackett, RN  Anti-Embolism Intervention: (low risk) Other (Comment)  Taken 11/10/2022 2000 by Rhina Brackett, RN  Anti-Embolism Intervention: (low risk) Other (Comment)  Goal: Optimal Comfort and Wellbeing  Outcome: Progressing  Goal: Readiness for Transition of Care  Outcome: Progressing  Goal: Rounds/Family Conference  Outcome: Progressing     Problem: Fall Injury Risk  Goal: Absence of Fall and Fall-Related Injury  Outcome: Progressing  Intervention: Promote Injury-Free Environment  Recent Flowsheet Documentation  Taken 11/11/2022 0400 by Rhina Brackett, RN  Safety Interventions:   fall reduction program maintained   low bed  Taken 11/10/2022 2000 by Rhina Brackett, RN  Safety Interventions:   fall reduction program maintained   low bed     Problem: Pain Acute  Goal: Optimal Pain Control and Function  Outcome: Progressing     Problem: Skin Injury Risk Increased  Goal: Skin Health and Integrity  Outcome: Progressing  Intervention: Optimize Skin Protection  Recent Flowsheet Documentation  Taken 11/11/2022 0600 by Rhina Brackett, RN  Pressure Reduction Techniques: frequent weight shift encouraged  Taken 11/11/2022 0400 by Rhina Brackett, RN  Pressure Reduction Techniques: frequent weight shift encouraged  Taken 11/11/2022 0200 by Rhina Brackett, RN  Pressure Reduction Techniques: frequent weight shift encouraged  Taken 11/11/2022 0000 by Rhina Brackett, RN  Pressure Reduction Techniques: frequent weight shift encouraged     Problem: Gas Exchange Impaired  Goal: Optimal Gas Exchange  Outcome: Progressing     Problem: Artificial Airway  Goal: Effective Communication  Outcome: Progressing  Goal: Optimal Device Function  Outcome: Progressing  Goal: Absence of Device-Related Skin or Tissue Injury  Outcome: Progressing     Problem: Airway Clearance Ineffective  Goal: Effective Airway Clearance  Outcome: Progressing

## 2022-11-11 NOTE — Unmapped (Signed)
Daily Progress Note    Assessment/Plan:    Principal Problem:    Acute exacerbation of bronchiectasis (CMS-HCC)  Active Problems:    Essential hypertension    Chronic respiratory failure with hypoxia (CMS-HCC)                 Jordan [name] is a 67 y.o. male who presented to [facility] with Acute exacerbation of bronchiectasis (CMS-HCC).    Acute exacerbation of bronchiectasis (CMS-HCC)   Acute on chronic hypoxemic respiratory failure  Follows with Dr. Garner Nash from Pulmonary Medicine for bronchiectasis. History of MDR Pseudomonas colonization. On 2L O2 via [place] at baseline. Presented with increasing dyspnea and production of purulent sputum over the last month. 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 Developed peripheral neuropathy possibly 2/2 voriconazole. Stable/improved off treatment. No complaints this morning. Ready for PICC placement tomorrow.    - pulmonology following  - Ciprofloxacin 750mg  po BID, Zosyn IV, Tobramycin nebs  - continue home Azithromycin daily for anti-inflammatory affect  - Singulair qhs   - hold home trelegy (hasn't been able to start this due to insurance/financial issues)  - Arformoterol BID  - Budesonide 0.5mg  BID  - Duonebs QID (pre-treatment for hypertonic saline)  - airway clearance QID (RT)              - Hypertonic saline 10% nebs              - mechanical clearance: Metaneb and Vibralung              - Daily Pulmozyme  - PICC line for anticipated 2-3 weeks of abx; ordered  - CRP and ESR weekly; continue on discharge  - EKG on hospital day 3 for QT monitoring on Cipro and Azithro was normal   - sputum culture with 1+ mucoid Pseudomonas and AFB culture pending  - continuous O2 monitoring  - follow-up appointment with me scheduled for 01/26/23   - see pulm consult note for dispo recommendations     Essential hypertension - Aortic Root Dilation   BP acceptable  - Diltiazem 120 every day  - Atorvastatin 10 qhs      Constipation  - Colace qhs, as per his request.   - monitor for BMs and intensify if needed     GERD  - Pantoprazole     DVT prophylaxis  - pt is ambulatory       Code Status / HCDM   -Full Code, Discussed with patient at the time of admission   -  HCDM (HCPOA): Janthony, Behar Spouse - [phone]     Disposition: Floor. DC to home after approximately 1 week hospitalization.       Please page the [facility]) pager at [phone] with questions.  __________________________________________________________________    Subjective:  No acute issues overnight.  This am reports a cramp in his right leg and felt two pops after putting weight on it.  Currently reports soreness below his left calf.  No edema or erythema noted.  DOE but no cp. Tolerating airway clearance therapy.    Labs/Studies:  Recent Results (from the past 24 hour(s))   ECG 12 Lead    Collection Time: 11/10/22 10:11 AM   Result Value Ref Range    EKG Systolic BP  mmHg    EKG Diastolic BP  mmHg    EKG Ventricular Rate 86 BPM    EKG Atrial Rate 86  BPM    EKG P-R Interval 198 ms    EKG QRS Duration 94 ms    EKG Q-T Interval 390 ms    EKG QTC Calculation 466 ms    EKG Calculated P Axis 59 degrees    EKG Calculated R Axis -62 degrees    EKG Calculated T Axis 54 degrees    QTC Fredericia 440 ms   Basic metabolic panel    Collection Time: 11/11/22  6:17 AM   Result Value Ref Range    Sodium 141 135 - 145 mmol/L    Potassium 3.4 3.4 - 4.8 mmol/L    Chloride 107 98 - 107 mmol/L    CO2 30.6 20.0 - 31.0 mmol/L    Anion Gap 3 (L) 5 - 14 mmol/L    BUN 18 9 - 23 mg/dL    Creatinine 0.98 1.19 - 1.18 mg/dL    BUN/Creatinine Ratio 18     eGFR CKD-EPI (2021) Male 83 >=60 mL/min/1.38m2    Glucose 90 70 - 179 mg/dL    Calcium 9.2 8.7 - 14.7 mg/dL   CBC    Collection Time: 11/11/22  6:17 AM   Result Value Ref Range    WBC 6.5 3.6 - 11.2 10*9/L    RBC 3.99 (L) 4.26 - 5.60 10*12/L    HGB 12.4 (L) 12.9 - 16.5 g/dL    HCT 82.9 (L) 56.2 - 48.0 %    MCV 93.1 77.6 - 95.7 fL MCH 31.1 25.9 - 32.4 pg    MCHC 33.4 32.0 - 36.0 g/dL    RDW 13.0 86.5 - 78.4 %    MPV 7.9 6.8 - 10.7 fL    Platelet 167 150 - 450 10*9/L     Objective:  Temp:  [36.1 ??C (97 ??F)-36.4 ??C (97.6 ??F)] 36.1 ??C (97 ??F)  Heart Rate:  [68-88] 68  Resp:  [17-18] 18  BP: (118-133)/(66-76) 118/66  SpO2:  [93 %-98 %] 95 %    GEN: pleasant elderly male sitting in a chair in NAD  HEENT: EOMI/MMM  CV: RRR no m/r/g  PULM: no breath sounds in RLL/RML with scattered rhonchi in RUL.  Left lung clear   ABD: soft, NT/ND, +BS  EXT: No edema. Tenderness to palpation below left calf  NEURO: alert and oriented x 3. No focal neuro deficits.

## 2022-11-11 NOTE — Unmapped (Signed)
Jordan [name] is a 67 y.o. male who is presenting to [facility] with Acute exacerbation of bronchiectasis (CMS-HCC), in the setting of the following pertinent/contributing co-morbidities: chronic hypoxemic respiratory failure, hypertension.     Acute exacerbation of bronchiectasis (CMS-HCC)   Acute on chronic hypoxemic respiratory failure  Follows with Dr. Garner Nash from Pulmonary Medicine for bronchiectasis. History of MDR Pseudomonas colonization. On 2L O2 via [place] at baseline. Presented with increasing dyspnea and production of purulent sputum over the last month. 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 Developed peripheral neuropathy possibly 2/2 voriconazole. Stable/improved off treatment. No complaints this morning. Ready for PICC placement tomorrow.  Started on Ciprofloxacin 750mg  po BID, Zosyn IV, Tobramycin nebs per pulmonary recs and we continued his  home Azithromycin daily for anti-inflammatory affect.  His home trelegy (hasn't been able to start this due to insurance/financial issues) was substituted with duonebs and he received aggressive airway clearance QID with RT including hypertonic saline, metaneb, and vibralung.  In addition her received daily Pulmozyme.  PICC line for anticipated 2-3 weeks of abx was placed.  He will need  CRP and ESR weekly  on discharge.  His sputum culture was notable for 1+ mucoid Pseudomonas and AFB culture was pending at discharge ***.  Follow-up appointment with pulmonary scheduled for 01/26/23.     Essential hypertension - Aortic Root Dilation   BP acceptable  - Diltiazem 120 every day  - Atorvastatin 10 qhs      Constipation  - Colace qhs, as per his request.   - monitor for BMs and intensify if needed     GERD  - Pantoprazole

## 2022-11-12 DIAGNOSIS — J471 Bronchiectasis with (acute) exacerbation: Secondary | ICD-10-CM | POA: Diagnosis not present

## 2022-11-12 DIAGNOSIS — B965 Pseudomonas (aeruginosa) (mallei) (pseudomallei) as the cause of diseases classified elsewhere: Secondary | ICD-10-CM | POA: Diagnosis not present

## 2022-11-12 DIAGNOSIS — I10 Essential (primary) hypertension: Secondary | ICD-10-CM | POA: Diagnosis not present

## 2022-11-12 DIAGNOSIS — J9611 Chronic respiratory failure with hypoxia: Secondary | ICD-10-CM | POA: Diagnosis not present

## 2022-11-12 DIAGNOSIS — K5900 Constipation, unspecified: Secondary | ICD-10-CM | POA: Diagnosis not present

## 2022-11-12 DIAGNOSIS — J9621 Acute and chronic respiratory failure with hypoxia: Secondary | ICD-10-CM | POA: Diagnosis not present

## 2022-11-12 LAB — CBC
HEMATOCRIT: 36.2 % — ABNORMAL LOW (ref 39.0–48.0)
HEMOGLOBIN: 12.2 g/dL — ABNORMAL LOW (ref 12.9–16.5)
MEAN CORPUSCULAR HEMOGLOBIN CONC: 33.7 g/dL (ref 32.0–36.0)
MEAN CORPUSCULAR HEMOGLOBIN: 31.3 pg (ref 25.9–32.4)
MEAN CORPUSCULAR VOLUME: 92.9 fL (ref 77.6–95.7)
MEAN PLATELET VOLUME: 7.8 fL (ref 6.8–10.7)
PLATELET COUNT: 154 10*9/L (ref 150–450)
RED BLOOD CELL COUNT: 3.9 10*12/L — ABNORMAL LOW (ref 4.26–5.60)
RED CELL DISTRIBUTION WIDTH: 12.6 % (ref 12.2–15.2)
WBC ADJUSTED: 6.7 10*9/L (ref 3.6–11.2)

## 2022-11-12 LAB — BASIC METABOLIC PANEL
ANION GAP: 3 mmol/L — ABNORMAL LOW (ref 5–14)
BLOOD UREA NITROGEN: 19 mg/dL (ref 9–23)
BUN / CREAT RATIO: 18
CALCIUM: 9.2 mg/dL (ref 8.7–10.4)
CHLORIDE: 106 mmol/L (ref 98–107)
CO2: 31.7 mmol/L — ABNORMAL HIGH (ref 20.0–31.0)
CREATININE: 1.08 mg/dL
EGFR CKD-EPI (2021) MALE: 75 mL/min/{1.73_m2} (ref >=60)
GLUCOSE RANDOM: 88 mg/dL (ref 70–179)
POTASSIUM: 3.6 mmol/L (ref 3.4–4.8)
SODIUM: 141 mmol/L (ref 135–145)

## 2022-11-12 MED ADMIN — ipratropium-albuterol (DUO-NEB) 0.5-2.5 mg/3 mL nebulizer solution 3 mL: 3 mL | RESPIRATORY_TRACT | @ 01:00:00

## 2022-11-12 MED ADMIN — sodium chloride 10 % NEBULIZER solution 5 mL: 5 mL | RESPIRATORY_TRACT | @ 17:00:00

## 2022-11-12 MED ADMIN — calcium carbonate tablet 600 mg elem calcium: 600 mg | ORAL | @ 02:00:00

## 2022-11-12 MED ADMIN — HYDROcodone-acetaminophen (NORCO) 5-325 mg per tablet 1.5 tablet: 1.5 | ORAL | @ 10:00:00 | Stop: 2022-11-21

## 2022-11-12 MED ADMIN — ciprofloxacin HCl (CIPRO) tablet 750 mg: 750 mg | ORAL | @ 22:00:00 | Stop: 2022-11-18

## 2022-11-12 MED ADMIN — montelukast (SINGULAIR) tablet 10 mg: 10 mg | ORAL | @ 02:00:00

## 2022-11-12 MED ADMIN — budesonide (PULMICORT) nebulizer solution 0.5 mg: .5 mg | RESPIRATORY_TRACT | @ 12:00:00

## 2022-11-12 MED ADMIN — pantoprazole (Protonix) EC tablet 20 mg: 20 mg | ORAL | @ 14:00:00

## 2022-11-12 MED ADMIN — dilTIAZem (CARDIZEM CD) 24 hr capsule 120 mg: 120 mg | ORAL | @ 14:00:00

## 2022-11-12 MED ADMIN — calcium carbonate tablet 600 mg elem calcium: 600 mg | ORAL | @ 14:00:00

## 2022-11-12 MED ADMIN — atorvastatin (LIPITOR) tablet 10 mg: 10 mg | ORAL | @ 14:00:00

## 2022-11-12 MED ADMIN — sodium chloride (NS) 0.9 % flush 10 mL: 10 mL | INTRAVENOUS | @ 15:00:00

## 2022-11-12 MED ADMIN — guaiFENesin (ROBITUSSIN) oral syrup: 200 mg | ORAL | @ 02:00:00

## 2022-11-12 MED ADMIN — piperacillin-tazobactam (ZOSYN) IVPB (premix) 4.5 g: 4.5 g | INTRAVENOUS | @ 10:00:00 | Stop: 2022-11-18

## 2022-11-12 MED ADMIN — ipratropium-albuterol (DUO-NEB) 0.5-2.5 mg/3 mL nebulizer solution 3 mL: 3 mL | RESPIRATORY_TRACT | @ 12:00:00

## 2022-11-12 MED ADMIN — sodium chloride (NS) 0.9 % flush 10 mL: 10 mL | INTRAVENOUS | @ 02:00:00

## 2022-11-12 MED ADMIN — sodium chloride 10 % NEBULIZER solution 5 mL: 5 mL | RESPIRATORY_TRACT | @ 01:00:00

## 2022-11-12 MED ADMIN — HYDROcodone-acetaminophen (NORCO) 5-325 mg per tablet 1.5 tablet: 1.5 | ORAL | @ 18:00:00 | Stop: 2022-11-21

## 2022-11-12 MED ADMIN — piperacillin-tazobactam (ZOSYN) IVPB (premix) 4.5 g: 4.5 g | INTRAVENOUS | @ 16:00:00 | Stop: 2022-11-18

## 2022-11-12 MED ADMIN — tobramycin (PF) (TOBI) 300 mg/5 mL nebulizer solution 300 mg: 300 mg | RESPIRATORY_TRACT | @ 01:00:00 | Stop: 2022-11-18

## 2022-11-12 MED ADMIN — ipratropium-albuterol (DUO-NEB) 0.5-2.5 mg/3 mL nebulizer solution 3 mL: 3 mL | RESPIRATORY_TRACT

## 2022-11-12 MED ADMIN — sodium chloride 10 % NEBULIZER solution 5 mL: 5 mL | RESPIRATORY_TRACT | @ 12:00:00

## 2022-11-12 MED ADMIN — arformoterol (BROVANA) nebulizer solution 15 mcg/2 mL: 15 ug | RESPIRATORY_TRACT | @ 01:00:00

## 2022-11-12 MED ADMIN — fluticasone propionate (FLONASE) 50 mcg/actuation nasal spray 1 spray: 1 | NASAL | @ 14:00:00

## 2022-11-12 MED ADMIN — pantoprazole (Protonix) EC tablet 20 mg: 20 mg | ORAL | @ 02:00:00

## 2022-11-12 MED ADMIN — ipratropium-albuterol (DUO-NEB) 0.5-2.5 mg/3 mL nebulizer solution 3 mL: 3 mL | RESPIRATORY_TRACT | @ 17:00:00

## 2022-11-12 MED ADMIN — dornase alfa (PULMOZYME) 1 mg/mL solution 2.5 mg: 2.5 mg | RESPIRATORY_TRACT | @ 12:00:00

## 2022-11-12 MED ADMIN — piperacillin-tazobactam (ZOSYN) IVPB (premix) 4.5 g: 4.5 g | INTRAVENOUS | @ 22:00:00 | Stop: 2022-11-18

## 2022-11-12 MED ADMIN — tobramycin (PF) (TOBI) 300 mg/5 mL nebulizer solution 300 mg: 300 mg | RESPIRATORY_TRACT | @ 12:00:00 | Stop: 2022-11-18

## 2022-11-12 MED ADMIN — ciprofloxacin HCl (CIPRO) tablet 750 mg: 750 mg | ORAL | @ 10:00:00 | Stop: 2022-11-18

## 2022-11-12 MED ADMIN — arformoterol (BROVANA) nebulizer solution 15 mcg/2 mL: 15 ug | RESPIRATORY_TRACT | @ 12:00:00

## 2022-11-12 MED ADMIN — piperacillin-tazobactam (ZOSYN) IVPB (premix) 4.5 g: 4.5 g | INTRAVENOUS | @ 04:00:00 | Stop: 2022-11-18

## 2022-11-12 MED ADMIN — ascorbic acid (vitamin C) (VITAMIN C) tablet 1,000 mg: 1000 mg | ORAL | @ 14:00:00

## 2022-11-12 MED ADMIN — budesonide (PULMICORT) nebulizer solution 0.5 mg: .5 mg | RESPIRATORY_TRACT | @ 01:00:00

## 2022-11-12 MED ADMIN — ipratropium-albuterol (DUO-NEB) 0.5-2.5 mg/3 mL nebulizer solution 3 mL: 3 mL | RESPIRATORY_TRACT | @ 20:00:00

## 2022-11-12 MED ADMIN — azithromycin (ZITHROMAX) tablet 250 mg: 250 mg | ORAL | @ 14:00:00 | Stop: 2022-11-18

## 2022-11-12 MED ADMIN — sodium chloride 10 % NEBULIZER solution 5 mL: 5 mL | RESPIRATORY_TRACT | @ 20:00:00

## 2022-11-12 MED ADMIN — HYDROcodone-acetaminophen (NORCO) 5-325 mg per tablet 1.5 tablet: 1.5 | ORAL | @ 02:00:00 | Stop: 2022-11-21

## 2022-11-12 MED ADMIN — multivitamin with folic acid 400 mcg tablet 1 tablet: 1 | ORAL | @ 14:00:00

## 2022-11-12 NOTE — Unmapped (Signed)
Home health has been set up for through the agency listed below. The Home health agency will be contacting you to set up a time for them to come see you in your home within 2 days of your discharge.  If you have not heard from them prior to 11/14/22 or you have any questions about home health, please contact them at the phone number listed below.    [facility] - Admitted Since 11/07/2022       Service Provider Selected Services Address Phone Fax Patient Preferred    [facility] [address] [place], [place] [address] [phone] --          [facility]  - Admitted Since 11/07/2022       Service Provider Selected Services Address Phone Fax Patient Preferred    [facility] [address] [place] [address] [phone] --

## 2022-11-12 NOTE — Unmapped (Signed)
Patient remains on 3 lpm O2 nasal canula. Scheduled breathing treatments and airway clearance done. Continues to have productive cough with metaneb airway clearance

## 2022-11-12 NOTE — Unmapped (Signed)
Problem: Gas Exchange Impaired  Goal: Optimal Gas Exchange  Outcome: Progressing     Patient has done well throughout shift. Treatments and AWC completed per order. No acute events of note at this time.

## 2022-11-12 NOTE — Unmapped (Signed)
General Pulmonary Team Follow Up Consult Note     Date of Service: 11/12/2022  Requesting Physician: Jacqualin Combes, MD   Requesting Service: Med Undesignated (MDX)  Reason for consultation: Comprehensive evaluation of  bronchiectasis exacerbation in the setting of chronic hypoxemic respiratory failure and PsA colonization .    Hospital Problems:  Principal Problem:    Acute exacerbation of bronchiectasis (CMS-HCC)  Active Problems:    Essential hypertension    Chronic respiratory failure with hypoxia (CMS-HCC)      HPI: Jordan [name] is a 67 y.o. male with bronchiectasis with chronic hypoxemic respiratory failure, Pseudomonas colonization, and prior history of lung resections.  I saw him in clinic on 10/23/2022 when he reported more frequent spells of chest tightness, wheezing, and difficulty expectorating sputum.  Based on this, we agreed to schedule an admission for IV antibiotics and aggressive airway clearance using alternative modalities to those that he uses at home.  No interval changes since I saw him in clinic aside from [place] and [place].  He had a good time.    Problems addressed during this consult include bronchiectasis exacerbation in the setting of chronic hypoxemic respiratory failure and Pseudomonas colonization. Based on these problems, the patient has moderate risk of morbidity/mortality which is commensurate w their risk of management options described below in the recommendations.    Assessment      Interval Events:  Calf is feeling better today - up and walking using walker.  Continues to cough up sputum with his airway clearance treatments.  Chest is feeling clear.  States that his breathing feels so good, he feels as if he could walk to the bathroom and back without his oxygen.  Is amenable to staying beyond Monday if I recommended.  Also open to PFTs prior to discharge to have a sense of lung function prior to discharge.       Impression: The patient has made significant clinical progress since last encounter. I personally reviewed most recent pertinent labs, imaging and micro data, from 11/12/2022, which are noted in recommendations.         Recommendations   Antibiotics:  Based on prior culture data (OPF, PsA) and allergy profile, continue treating with Zosyn IV, Cipro 750 mg oral, and [name] nebs.  Single lumen PICC line placed for 2-3 weeks of IV antibiotics. Duration to be determined based on clinical response and improvement in inflammatory markers.  Continue chronic azithromycin for anti-inflammatory effect.     Other medications:  Trelegy converted to Duonebs (pre-treatment before hypertonic saline), budesonide 0.5 mg bid, and arformoterol 15 mcg bid.     Airway clearance: Continue aggressive airway clearance 4 times per day.  Nebulized hypertonic saline 10%; pre-treatment with albuterol/ipratropium.  For mechanical clearance, continue Metaneb and Vibralung. He will let me know if he likes the metaneb enough to pursue similar device for home use.  Continue daily Pulmozyme.  Postural drainage     Labs:  Admission labs done. CBC-D notable for elevated absolute eosinophils to 0.5. CMP WNL.  CRP elevated to 12.0 and ESR elevated to 52.   Sputum for bacterial and AFB cultures (NTM screening) in process. Lower respiratory culture preliminary with 2+ OPF and 1+ mPsA.  Weekly CRP and ESR to trend (including as outpatient). Due on Saturday.  [facility] Guideline for Monitoring Outpatient Intravenous and Oral Antimicrobial Therapy recommended monitoring labs ([email or web address]    Consult:  If he continues to have right calf  pain or it worsens, consider physical therapy consultation     Imaging:  CXR as noted below.     Other Testing:  Planning on flow volume loop on Tuesday prior to discharge.  EKG after a few days of Cipro and azithromycin - done 11/10/22 and WNL.     Dispo:   Patient has a follow-up appointment with me scheduled for 01/26/23.  Anticipate discharge on Tuesday, August 27 after PFTs.    Please reach out to [facility] Bronchiectasis/NTM Care and [facility] team (myself, Alvino Blood RN, Eliberto Ivory CPP) prior to discharge to ensure that home infusion orders are correct and that we are comfortable with patient discharging home on IVs at that time.  We will follow monitoring labs.  Please use DOTPULMIVDISCHARGE for home infusion orders. We will follow monitoring labs. Does not need OPAT.  Do not discharge Friday through Sunday or on a holiday as no outpatient clinical staff available to assist if problems arise with home health/home infusion.   I provided him a form for the pharmacy assistance program to assist with the co-pay cost of his Trelegy.    The recommendations outlined in this note were discussed w the primary team direct (in-person). Please do not hesitate to page [phone] [facility] consult) with questions. We appreciate the opportunity to assist in the care of this patient. We look forward to following with you.    Attestation      I directly provided consultative services as documented in this note and I personally spent 35 minutes face-to-face and non face-to-face in the care of this patient, which includes all pre, intra and post visit time on the date of service including examining the patient, evaluating/interpreting objective clinical data, coordinating care with the entire multidisciplinary care team and developing a comprehensive management plan as outlined above. All documented time was specific to the E/M visit and does not include any procedures that may have been performed.      Viona Gilmore, MD    Subjective & Objective     Vitals - past 24 hours  Temp:  [36.2 ??C (97.2 ??F)-36.4 ??C (97.5 ??F)] 36.3 ??C (97.3 ??F)  Heart Rate:  [72-92] 78  Resp:  [18] 18  BP: (111-114)/(63-66) 114/63  SpO2:  [94 %-100 %] 100 % Intake/Output  I/O last 3 completed shifts:  In: 1280 [P.O.:980; IV Piggyback:300]  Out: -       Pertinent exam findings:   General appearance - well appearing, well nourished, and in no distress  Eyes - EOMI, PERRLA, anicteric sclerae, pink conjunctiva  Mouth - moist mucous membranes, no pharyngeal erythema or exudates  Neck - Trachea midline, normal neck movement  Lymphatics - not examined  Heart - regular rate and rhythm, normal S1/S2, no gallops, rubs, or murmurs  Chest -absent breath sounds over the right middle and lower lobe, corresponding with prior resection.  Inspiratory and expiratory crackles over left lower lung field. Minimal crackles over left mid/upper lung fields.  Improved air entry over right upper lobe.  Easy work of breathing without accessory muscle use.  Speaking in full sentences without difficulty.  Abdomen - not examined  Extremities -Clubbing of fingers.  No cyanosis.  Skin - normal coloration and turgor, no rashes, no suspicious skin lesions noted  Neurological - alert, oriented, normal speech, no focal findings or movement disorder noted    Relevant Imaging:  - CXR on 11/07/2022 revealed stable architectural distortion, volume loss, and bronchiectasis in the right  upper lobe as well as stable extensive bronchiectasis and bronchial wall thickening of the left lung.    Arterial Blood Gas:   No results for input(s): SPECTYPEART, PHART, PCO2ART, PO2ART, HCO3ART, BEART, O2SATART in the last 24 hours.     Venous Blood Gas:   No results for input(s): PHVEN, PCO2VEN, PO2VEN, HCO3VEN, BEVEN, O2SATVEN in the last 24 hours.     Cultures:  Lower Respiratory Culture (no units)   Date Value   11/07/2022 1+ Mucoid Pseudomonas aeruginosa (A)   11/07/2022 2+ Oropharyngeal Flora Isolated     WBC (10*9/L)   Date Value   11/12/2022 6.7          Other Labs:  Lab Results   Component Value Date WBC 6.7 11/12/2022    HGB 12.2 (L) 11/12/2022    HCT 36.2 (L) 11/12/2022    PLT 154 11/12/2022     Lab Results   Component Value Date    NA 141 11/12/2022    K 3.6 11/12/2022    CL 106 11/12/2022    CO2 31.7 (H) 11/12/2022    BUN 19 11/12/2022    CREATININE 1.08 11/12/2022    GLU 88 11/12/2022    CALCIUM 9.2 11/12/2022    MG 1.5 (L) 12/22/2021    PHOS 3.7 01/22/2016     Lab Results   Component Value Date    BILITOT 0.4 11/07/2022    BILIDIR 0.40 04/02/2015    PROT 6.8 11/07/2022    ALBUMIN 3.4 11/07/2022    ALT 12 11/07/2022    AST 13 11/07/2022    ALKPHOS 84 11/07/2022    GGT 64 06/21/2013     Lab Results   Component Value Date    LABPROT 10.4 08/03/2013    INR 0.95 04/14/2017    APTT 32.6 07/25/2014       Allergies & Home Medications   Personally reviewed in Epic    Continuous Infusions:       Scheduled Medications:    arformoterol  15 mcg Nebulization BID (RT)    ascorbic acid (vitamin C)  1,000 mg Oral Daily    atorvastatin  10 mg Oral Daily    azithromycin  250 mg Oral Daily    budesonide (PULMICORT) nebulizer solution  0.5 mg Nebulization BID (RT)    calcium carbonate  600 mg elem calcium Oral BID    ciprofloxacin HCl  750 mg Oral BID    dilTIAZem  120 mg Oral Daily    docusate sodium  100 mg Oral Nightly    dornase alfa  2.5 mg Inhalation Daily    fluticasone propionate  1 spray Each Nare Daily    HYDROcodone-acetaminophen  1.5 tablet Oral Q8H    ipratropium-albuterol  3 mL Nebulization 4x Daily (RT)    montelukast  10 mg Oral Nightly    multivitamins (ADULT)  1 tablet Oral Daily    pantoprazole  20 mg Oral BID    piperacillin-tazobactam (ZOSYN) IV (intermittent)  4.5 g Intravenous Q6H    sodium chloride  10 mL Intravenous Q8H    sodium chloride  5 mL Nebulization 4x Daily (RT)    tobramycin (PF)  300 mg Nebulization BID (RT)       PRN medications:  alum-mag-simeth, guaiFENesin, melatonin, senna

## 2022-11-12 NOTE — Unmapped (Signed)
No acute changes overnight. A&Ox4, remains in 3L via [place], afebrile and VSS during this shift. Falls and injuries free this shift. Received IV antibiotic and tolerated well. Pain was well controlled by Norco. No respiratory distress noted. Isolations in place. Plan of care continue.    Problem: Adult Inpatient Plan of Care  Goal: Plan of Care Review  Outcome: Progressing  Flowsheets (Taken 11/12/2022 0456)  Progress: improving  Plan of Care Reviewed With: patient  Goal: Patient-Specific Goal (Individualized)  Outcome: Progressing  Flowsheets (Taken 11/12/2022 0456)  Patient/Family-Specific Goals (Include Timeframe): Patient will remain free of falls and injuries during this shift 7p-7a  Individualized Care Needs: Breathing treatments, IV abxs, O2, pain control, falls/safety precautions, monitor labs and VS  Anxieties, Fears or Concerns: Wants to go home  Goal: Absence of Hospital-Acquired Illness or Injury  Intervention: Identify and Manage Fall Risk  Recent Flowsheet Documentation  Taken 11/12/2022 0400 by Hillery Aldo, RN  Safety Interventions:   fall reduction program maintained   low bed   nonskid shoes/slippers when out of bed   isolation precautions  Taken 11/12/2022 0200 by Hillery Aldo, RN  Safety Interventions:   fall reduction program maintained   isolation precautions   low bed   nonskid shoes/slippers when out of bed  Taken 11/12/2022 0000 by Hillery Aldo, RN  Safety Interventions:   fall reduction program maintained   isolation precautions   low bed   nonskid shoes/slippers when out of bed  Taken 11/11/2022 2200 by Hillery Aldo, RN  Safety Interventions:   fall reduction program maintained   isolation precautions   low bed   nonskid shoes/slippers when out of bed  Taken 11/11/2022 2000 by Hillery Aldo, RN  Safety Interventions:   fall reduction program maintained   isolation precautions   low bed   nonskid shoes/slippers when out of bed  Intervention: Prevent Skin Injury  Recent Flowsheet Documentation  Taken 11/12/2022 0400 by Hillery Aldo, RN  Positioning for Skin: Right  Taken 11/12/2022 0200 by Hillery Aldo, RN  Positioning for Skin: Right  Taken 11/12/2022 0000 by Hillery Aldo, RN  Positioning for Skin: Left  Taken 11/11/2022 2200 by Hillery Aldo, RN  Positioning for Skin: Sitting in Chair  Taken 11/11/2022 2000 by Hillery Aldo, RN  Positioning for Skin: Sitting in Chair  Intervention: Prevent and Manage VTE (Venous Thromboembolism) Risk  Recent Flowsheet Documentation  Taken 11/12/2022 0400 by Hillery Aldo, RN  Anti-Embolism Intervention: (low riak per MD) Other (Comment)  Taken 11/12/2022 0200 by Hillery Aldo, RN  Anti-Embolism Intervention: (low riak per MD) Other (Comment)  Taken 11/12/2022 0000 by Hillery Aldo, RN  Anti-Embolism Intervention: (low riak per MD) Other (Comment)  Taken 11/11/2022 2200 by Hillery Aldo, RN  Anti-Embolism Intervention: (low riak per MD) Other (Comment)  Taken 11/11/2022 2000 by Hillery Aldo, RN  Anti-Embolism Intervention: (low riak per MD) Other (Comment)     Problem: Infection  Goal: Absence of Infection Signs and Symptoms  Outcome: Progressing  Intervention: Prevent or Manage Infection  Recent Flowsheet Documentation  Taken 11/12/2022 0400 by Hillery Aldo, RN  Isolation Precautions: contact precautions maintained  Taken 11/12/2022 0200 by Hillery Aldo, RN  Isolation Precautions: contact precautions maintained  Taken 11/12/2022 0000 by Hillery Aldo, RN  Isolation Precautions: contact precautions maintained  Taken 11/11/2022 2200 by Hillery Aldo, RN  Isolation Precautions: contact precautions maintained  Taken 11/11/2022 2000 by Hillery Aldo, RN  Isolation Precautions: contact precautions maintained     Problem: Fall Injury Risk  Goal: Absence of Fall and Fall-Related Injury  Outcome: Progressing  Intervention: Promote Scientist, clinical (histocompatibility and immunogenetics) Documentation  Taken 11/12/2022 0400 by Hillery Aldo, RN  Safety Interventions:   fall reduction program maintained   low bed   nonskid shoes/slippers when out of bed   isolation precautions  Taken 11/12/2022 0200 by Hillery Aldo, RN  Safety Interventions:   fall reduction program maintained   isolation precautions   low bed   nonskid shoes/slippers when out of bed  Taken 11/12/2022 0000 by Hillery Aldo, RN  Safety Interventions:   fall reduction program maintained   isolation precautions   low bed   nonskid shoes/slippers when out of bed  Taken 11/11/2022 2200 by Hillery Aldo, RN  Safety Interventions:   fall reduction program maintained   isolation precautions   low bed   nonskid shoes/slippers when out of bed  Taken 11/11/2022 2000 by Hillery Aldo, RN  Safety Interventions:   fall reduction program maintained   isolation precautions   low bed   nonskid shoes/slippers when out of bed

## 2022-11-12 NOTE — Unmapped (Signed)
Patient had complaints of RLE pain with Dr. Lanetta Inch aware and new orders received. Patient ambulating with use of walker to bathroom.     Problem: Adult Inpatient Plan of Care  Goal: Plan of Care Review  Outcome: Progressing  Goal: Patient-Specific Goal (Individualized)  Outcome: Progressing  Goal: Absence of Hospital-Acquired Illness or Injury  Outcome: Progressing  Intervention: Identify and Manage Fall Risk  Recent Flowsheet Documentation  Taken 11/11/2022 0916 by Michail Sermon I, RN  Safety Interventions:   fall reduction program maintained   nonskid shoes/slippers when out of bed  Intervention: Prevent Skin Injury  Recent Flowsheet Documentation  Taken 11/11/2022 1800 by Michail Sermon I, RN  Positioning for Skin: Sitting in Chair  Taken 11/11/2022 1600 by Michail Sermon I, RN  Positioning for Skin: Sitting in Chair  Taken 11/11/2022 1400 by Michail Sermon I, RN  Positioning for Skin: Sitting in Chair  Taken 11/11/2022 1200 by Michail Sermon I, RN  Positioning for Skin: Sitting in Chair  Taken 11/11/2022 1045 by Michail Sermon I, RN  Positioning for Skin: Sitting in Chair  Taken 11/11/2022 0916 by Michail Sermon I, RN  Positioning for Skin: Sitting in Chair  Goal: Optimal Comfort and Wellbeing  Outcome: Progressing  Goal: Readiness for Transition of Care  Outcome: Progressing  Goal: Rounds/Family Conference  Outcome: Progressing     Problem: Infection  Goal: Absence of Infection Signs and Symptoms  Outcome: Progressing     Problem: Fall Injury Risk  Goal: Absence of Fall and Fall-Related Injury  Outcome: Progressing  Intervention: Promote Injury-Free Environment  Recent Flowsheet Documentation  Taken 11/11/2022 0916 by Michail Sermon I, RN  Safety Interventions:   fall reduction program maintained   nonskid shoes/slippers when out of bed     Problem: Pain Acute  Goal: Optimal Pain Control and Function  Outcome: Progressing     Problem: Skin Injury Risk Increased  Goal: Skin Health and Integrity  Outcome: Progressing  Intervention: Optimize Skin Protection  Recent Flowsheet Documentation  Taken 11/11/2022 1600 by Amani Marseille I, RN  Pressure Reduction Techniques: frequent weight shift encouraged  Taken 11/11/2022 1400 by Xia Stohr I, RN  Pressure Reduction Techniques: frequent weight shift encouraged  Taken 11/11/2022 1200 by Ilia Engelbert I, RN  Pressure Reduction Techniques: frequent weight shift encouraged  Taken 11/11/2022 1045 by Anahit Klumb I, RN  Pressure Reduction Techniques: frequent weight shift encouraged  Taken 11/11/2022 0916 by Vrishank Moster, Pauletta Browns I, RN  Activity Management: up in chair  Pressure Reduction Techniques: frequent weight shift encouraged     Problem: Gas Exchange Impaired  Goal: Optimal Gas Exchange  Outcome: Progressing     Problem: Artificial Airway  Goal: Effective Communication  Outcome: Progressing  Goal: Optimal Device Function  Outcome: Progressing  Goal: Absence of Device-Related Skin or Tissue Injury  Outcome: Progressing     Problem: Airway Clearance Ineffective  Goal: Effective Airway Clearance  Outcome: Progressing  Intervention: Promote Airway Secretion Clearance  Recent Flowsheet Documentation  Taken 11/11/2022 0916 by Michail Sermon I, RN  Activity Management: up in chair

## 2022-11-12 NOTE — Unmapped (Signed)
Pt A.o x 4. Able to express needs. Spends most of time in recliner. Took meds without difficulty. No co pain or discomfort. Maintain sats with 3L of o2. VSS. Had a bath during the shift. Independent wit walker in room. Maintaining contact precautions. Pt c/o swelling on right foot after shower. Nurse recommended elevating legs and pt satisfied with that.  Safety measures maintained. Call bell within reach.      Problem: Adult Inpatient Plan of Care  Goal: Plan of Care Review  Outcome: Progressing  Goal: Patient-Specific Goal (Individualized)  Outcome: Progressing  Goal: Absence of Hospital-Acquired Illness or Injury  Outcome: Progressing  Intervention: Identify and Manage Fall Risk  Recent Flowsheet Documentation  Taken 11/12/2022 1000 by Lucendia Herrlich, Kellie Moor, RN  Safety Interventions:   fall reduction program maintained   environmental modification  Taken 11/12/2022 0800 by Lucendia Herrlich, Kellie Moor, RN  Safety Interventions:   fall reduction program maintained   environmental modification  Goal: Optimal Comfort and Wellbeing  Outcome: Progressing  Goal: Readiness for Transition of Care  Outcome: Progressing  Goal: Rounds/Family Conference  Outcome: Progressing     Problem: Infection  Goal: Absence of Infection Signs and Symptoms  Outcome: Progressing     Problem: Fall Injury Risk  Goal: Absence of Fall and Fall-Related Injury  Outcome: Progressing  Intervention: Promote Scientist, clinical (histocompatibility and immunogenetics) Documentation  Taken 11/12/2022 1000 by Lucendia Herrlich, Kellie Moor, RN  Safety Interventions:   fall reduction program maintained   environmental modification  Taken 11/12/2022 0800 by Lucendia Herrlich, Kellie Moor, RN  Safety Interventions:   fall reduction program maintained   environmental modification     Problem: Pain Acute  Goal: Optimal Pain Control and Function  Outcome: Progressing     Problem: Skin Injury Risk Increased  Goal: Skin Health and Integrity  Outcome: Progressing     Problem: Gas Exchange Impaired  Goal: Optimal Gas Exchange  Outcome: Progressing     Problem: Artificial Airway  Goal: Effective Communication  Outcome: Progressing  Goal: Optimal Device Function  Outcome: Progressing  Goal: Absence of Device-Related Skin or Tissue Injury  Outcome: Progressing     Problem: Airway Clearance Ineffective  Goal: Effective Airway Clearance  Outcome: Progressing

## 2022-11-12 NOTE — Unmapped (Signed)
Daily Progress Note    Assessment/Plan:    Principal Problem:    Acute exacerbation of bronchiectasis (CMS-HCC)  Active Problems:    Essential hypertension    Chronic respiratory failure with hypoxia (CMS-HCC)                 Jordan [name] is a 67 y.o. male who presented to [facility] with Acute exacerbation of bronchiectasis (CMS-HCC).    Acute exacerbation of bronchiectasis (CMS-HCC)   Acute on chronic hypoxemic respiratory failure  Follows with Dr. Garner Nash from Pulmonary Medicine for bronchiectasis. History of MDR Pseudomonas colonization. On 2L O2 via [place] at baseline. Presented with increasing dyspnea and production of purulent sputum over the last month. 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 Developed peripheral neuropathy possibly 2/2 voriconazole. Stable/improved off treatment. No complaints this morning. Ready for PICC placement tomorrow.    - pulmonology following  - Ciprofloxacin 750mg  po BID, Zosyn IV, Tobramycin nebs  - continue home Azithromycin daily for anti-inflammatory affect  - Singulair qhs   - hold home trelegy (hasn't been able to start this due to insurance/financial issues)  - Arformoterol BID  - Budesonide 0.5mg  BID  - Duonebs QID (pre-treatment for hypertonic saline)  - airway clearance QID (RT)              - Hypertonic saline 10% nebs              - mechanical clearance: Metaneb and Vibralung              - Daily Pulmozyme  - PICC line for anticipated 2-3 weeks of abx; ordered  - CRP and ESR weekly; continue on discharge  - EKG on hospital day 3 for QT monitoring on Cipro and Azithro was normal   - sputum culture with 1+ mucoid Pseudomonas and AFB culture pending  - continuous O2 monitoring  - follow-up appointment with me scheduled for 01/26/23   - see pulm consult note for dispo recommendations     Essential hypertension - Aortic Root Dilation   BP acceptable  - Diltiazem 120 every day  - Atorvastatin 10 qhs      Constipation  - Colace qhs, as per his request.   - monitor for BMs and intensify if needed     GERD  - Pantoprazole     DVT prophylaxis  - pt is ambulatory       Code Status / HCDM   -Full Code, Discussed with patient at the time of admission   -  HCDM (HCPOA): Danyell, Haser Spouse - [phone]     Disposition: Floor. DC to home approximately early next week       Please page the [facility]) pager at [phone] with questions.  __________________________________________________________________    Subjective:  No acute issues overnight.  This am reports improved R calf pain and is able to ambulate.  DOE but no cp. Tolerating airway clearance therapy.    Labs/Studies:  Recent Results (from the past 24 hour(s))   Basic metabolic panel    Collection Time: 11/12/22  6:13 AM   Result Value Ref Range    Sodium 141 135 - 145 mmol/L    Potassium 3.6 3.4 - 4.8 mmol/L    Chloride 106 98 - 107 mmol/L    CO2 31.7 (H) 20.0 - 31.0 mmol/L    Anion Gap 3 (L) 5 - 14 mmol/L  BUN 19 9 - 23 mg/dL    Creatinine 5.40 9.81 - 1.18 mg/dL    BUN/Creatinine Ratio 18     eGFR CKD-EPI (2021) Male 74 >=60 mL/min/1.18m2    Glucose 88 70 - 179 mg/dL    Calcium 9.2 8.7 - 19.1 mg/dL   CBC    Collection Time: 11/12/22  6:13 AM   Result Value Ref Range    WBC 6.7 3.6 - 11.2 10*9/L    RBC 3.90 (L) 4.26 - 5.60 10*12/L    HGB 12.2 (L) 12.9 - 16.5 g/dL    HCT 47.8 (L) 29.5 - 48.0 %    MCV 92.9 77.6 - 95.7 fL    MCH 31.3 25.9 - 32.4 pg    MCHC 33.7 32.0 - 36.0 g/dL    RDW 62.1 30.8 - 65.7 %    MPV 7.8 6.8 - 10.7 fL    Platelet 154 150 - 450 10*9/L     Objective:  Temp:  [36.2 ??C (97.2 ??F)-36.4 ??C (97.5 ??F)] 36.3 ??C (97.3 ??F)  Heart Rate:  [72-92] 78  Resp:  [18] 18  BP: (111-114)/(63-66) 114/63  SpO2:  [94 %-100 %] 100 %    GEN: pleasant elderly male sitting in a chair in NAD  HEENT: EOMI/MMM  CV: RRR no m/r/g  PULM: no breath sounds in RLL/RML with scattered rhonchi in RUL.  Left lung clear   ABD: soft, NT/ND, +BS  EXT: No edema.   NEURO: alert and oriented x 3. No focal neuro deficits.

## 2022-11-13 DIAGNOSIS — J479 Bronchiectasis, uncomplicated: Principal | ICD-10-CM

## 2022-11-13 DIAGNOSIS — B965 Pseudomonas (aeruginosa) (mallei) (pseudomallei) as the cause of diseases classified elsewhere: Secondary | ICD-10-CM | POA: Diagnosis not present

## 2022-11-13 DIAGNOSIS — J471 Bronchiectasis with (acute) exacerbation: Secondary | ICD-10-CM | POA: Diagnosis not present

## 2022-11-13 DIAGNOSIS — J9611 Chronic respiratory failure with hypoxia: Secondary | ICD-10-CM | POA: Diagnosis not present

## 2022-11-13 MED ORDER — TOBRAMYCIN 300 MG/5 ML IN 0.225 % SODIUM CHLORIDE FOR NEBULIZATION
Freq: Two times a day (BID) | RESPIRATORY_TRACT | 5 refills | 28 days | Status: CP
Start: 2022-11-13 — End: 2023-11-13
  Filled 2022-12-21: qty 280, 28d supply, fill #0

## 2022-11-13 MED ADMIN — tobramycin (PF) (TOBI) 300 mg/5 mL nebulizer solution 300 mg: 300 mg | RESPIRATORY_TRACT | Stop: 2022-11-18

## 2022-11-13 MED ADMIN — calcium carbonate tablet 600 mg elem calcium: 600 mg | ORAL | @ 02:00:00

## 2022-11-13 MED ADMIN — docusate sodium (COLACE) capsule 100 mg: 100 mg | ORAL | @ 02:00:00

## 2022-11-13 MED ADMIN — pantoprazole (Protonix) EC tablet 20 mg: 20 mg | ORAL | @ 02:00:00

## 2022-11-13 MED ADMIN — piperacillin-tazobactam (ZOSYN) IVPB (premix) 4.5 g: 4.5 g | INTRAVENOUS | @ 10:00:00 | Stop: 2022-11-18

## 2022-11-13 MED ADMIN — dornase alfa (PULMOZYME) 1 mg/mL solution 2.5 mg: 2.5 mg | RESPIRATORY_TRACT | @ 12:00:00

## 2022-11-13 MED ADMIN — atorvastatin (LIPITOR) tablet 10 mg: 10 mg | ORAL | @ 12:00:00

## 2022-11-13 MED ADMIN — ipratropium-albuterol (DUO-NEB) 0.5-2.5 mg/3 mL nebulizer solution 3 mL: 3 mL | RESPIRATORY_TRACT | @ 12:00:00

## 2022-11-13 MED ADMIN — dilTIAZem (CARDIZEM CD) 24 hr capsule 120 mg: 120 mg | ORAL | @ 12:00:00

## 2022-11-13 MED ADMIN — sodium chloride 10 % NEBULIZER solution 5 mL: 5 mL | RESPIRATORY_TRACT

## 2022-11-13 MED ADMIN — tobramycin (PF) (TOBI) 300 mg/5 mL nebulizer solution 300 mg: 300 mg | RESPIRATORY_TRACT | @ 12:00:00 | Stop: 2022-11-18

## 2022-11-13 MED ADMIN — budesonide (PULMICORT) nebulizer solution 0.5 mg: .5 mg | RESPIRATORY_TRACT

## 2022-11-13 MED ADMIN — sodium chloride 10 % NEBULIZER solution 5 mL: 5 mL | RESPIRATORY_TRACT | @ 20:00:00

## 2022-11-13 MED ADMIN — ciprofloxacin HCl (CIPRO) tablet 750 mg: 750 mg | ORAL | @ 10:00:00 | Stop: 2022-11-18

## 2022-11-13 MED ADMIN — pantoprazole (Protonix) EC tablet 20 mg: 20 mg | ORAL | @ 12:00:00

## 2022-11-13 MED ADMIN — HYDROcodone-acetaminophen (NORCO) 5-325 mg per tablet 1.5 tablet: 1.5 | ORAL | @ 02:00:00 | Stop: 2022-11-21

## 2022-11-13 MED ADMIN — piperacillin-tazobactam (ZOSYN) IVPB (premix) 4.5 g: 4.5 g | INTRAVENOUS | @ 17:00:00 | Stop: 2022-11-18

## 2022-11-13 MED ADMIN — HYDROcodone-acetaminophen (NORCO) 5-325 mg per tablet 1.5 tablet: 1.5 | ORAL | @ 17:00:00 | Stop: 2022-11-21

## 2022-11-13 MED ADMIN — ipratropium-albuterol (DUO-NEB) 0.5-2.5 mg/3 mL nebulizer solution 3 mL: 3 mL | RESPIRATORY_TRACT

## 2022-11-13 MED ADMIN — arformoterol (BROVANA) nebulizer solution 15 mcg/2 mL: 15 ug | RESPIRATORY_TRACT | @ 12:00:00

## 2022-11-13 MED ADMIN — arformoterol (BROVANA) nebulizer solution 15 mcg/2 mL: 15 ug | RESPIRATORY_TRACT

## 2022-11-13 MED ADMIN — budesonide (PULMICORT) nebulizer solution 0.5 mg: .5 mg | RESPIRATORY_TRACT | @ 12:00:00

## 2022-11-13 MED ADMIN — sodium chloride 10 % NEBULIZER solution 5 mL: 5 mL | RESPIRATORY_TRACT | @ 12:00:00

## 2022-11-13 MED ADMIN — azithromycin (ZITHROMAX) tablet 250 mg: 250 mg | ORAL | @ 12:00:00 | Stop: 2022-11-18

## 2022-11-13 MED ADMIN — ciprofloxacin HCl (CIPRO) tablet 750 mg: 750 mg | ORAL | @ 22:00:00 | Stop: 2022-11-18

## 2022-11-13 MED ADMIN — sodium chloride (NS) 0.9 % flush 10 mL: 10 mL | INTRAVENOUS | @ 02:00:00

## 2022-11-13 MED ADMIN — fluticasone propionate (FLONASE) 50 mcg/actuation nasal spray 1 spray: 1 | NASAL | @ 12:00:00

## 2022-11-13 MED ADMIN — multivitamin with folic acid 400 mcg tablet 1 tablet: 1 | ORAL | @ 12:00:00

## 2022-11-13 MED ADMIN — piperacillin-tazobactam (ZOSYN) IVPB (premix) 4.5 g: 4.5 g | INTRAVENOUS | @ 22:00:00 | Stop: 2022-11-18

## 2022-11-13 MED ADMIN — ipratropium-albuterol (DUO-NEB) 0.5-2.5 mg/3 mL nebulizer solution 3 mL: 3 mL | RESPIRATORY_TRACT | @ 20:00:00

## 2022-11-13 MED ADMIN — calcium carbonate tablet 600 mg elem calcium: 600 mg | ORAL | @ 12:00:00

## 2022-11-13 MED ADMIN — ipratropium-albuterol (DUO-NEB) 0.5-2.5 mg/3 mL nebulizer solution 3 mL: 3 mL | RESPIRATORY_TRACT | @ 16:00:00

## 2022-11-13 MED ADMIN — guaiFENesin (ROBITUSSIN) oral syrup: 200 mg | ORAL | @ 02:00:00

## 2022-11-13 MED ADMIN — sodium chloride 10 % NEBULIZER solution 5 mL: 5 mL | RESPIRATORY_TRACT | @ 16:00:00

## 2022-11-13 MED ADMIN — montelukast (SINGULAIR) tablet 10 mg: 10 mg | ORAL | @ 02:00:00

## 2022-11-13 MED ADMIN — piperacillin-tazobactam (ZOSYN) IVPB (premix) 4.5 g: 4.5 g | INTRAVENOUS | @ 04:00:00 | Stop: 2022-11-18

## 2022-11-13 MED ADMIN — HYDROcodone-acetaminophen (NORCO) 5-325 mg per tablet 1.5 tablet: 1.5 | ORAL | @ 10:00:00 | Stop: 2022-11-21

## 2022-11-13 MED ADMIN — ascorbic acid (vitamin C) (VITAMIN C) tablet 1,000 mg: 1000 mg | ORAL | @ 12:00:00

## 2022-11-13 MED ADMIN — sodium chloride (NS) 0.9 % flush 10 mL: 10 mL | INTRAVENOUS | @ 17:00:00

## 2022-11-13 NOTE — Unmapped (Signed)
No acute issues this shift, IV antibiotics given through right upper arm PICC. PICC clean dry and intact, flushed x1, dressing change due 8/26. Will continue with POC   Problem: Adult Inpatient Plan of Care  Goal: Plan of Care Review  Outcome: Progressing  Flowsheets (Taken 11/13/2022 1444)  Plan of Care Reviewed With: patient  Goal: Patient-Specific Goal (Individualized)  Outcome: Progressing  Goal: Absence of Hospital-Acquired Illness or Injury  Outcome: Progressing  Goal: Optimal Comfort and Wellbeing  Outcome: Progressing  Goal: Readiness for Transition of Care  Outcome: Progressing  Goal: Rounds/Family Conference  Outcome: Progressing     Problem: Infection  Goal: Absence of Infection Signs and Symptoms  Outcome: Progressing     Problem: Fall Injury Risk  Goal: Absence of Fall and Fall-Related Injury  Outcome: Progressing     Problem: Pain Acute  Goal: Optimal Pain Control and Function  Outcome: Progressing     Problem: Skin Injury Risk Increased  Goal: Skin Health and Integrity  Outcome: Progressing     Problem: Gas Exchange Impaired  Goal: Optimal Gas Exchange  Outcome: Progressing     Problem: Artificial Airway  Goal: Effective Communication  Outcome: Progressing  Goal: Optimal Device Function  Outcome: Progressing  Goal: Absence of Device-Related Skin or Tissue Injury  Outcome: Progressing     Problem: Airway Clearance Ineffective  Goal: Effective Airway Clearance  Outcome: Progressing

## 2022-11-13 NOTE — Unmapped (Signed)
[facility] Refill Coordination Note    Specialty Medication(s) to be Shipped:   CF/Pulmonary/Asthma: colistimethate 150 mg injection (COLYMYCIN)    Other medication(s) to be shipped:  BD luer lok, BD regular bevel, BD Posiflush, alcohol pads     Jordan [name], DOB: 1955-09-15  Phone: [phone] (home)       All above HIPAA information was verified with patient.     Was a Nurse, learning disability used for this call? No    Completed refill call assessment today to schedule patient's medication shipment from the [facility] [phone]).  All relevant notes have been reviewed.     Specialty medication(s) and dose(s) confirmed: Regimen is correct and unchanged.   Changes to medications: Kam reports no changes at this time.  Changes to insurance: No  New side effects reported not previously addressed with a pharmacist or physician: None reported  Questions for the pharmacist: No    Confirmed patient received a Conservation officer, historic buildings and a Surveyor, mining with first shipment. The patient will receive a drug information handout for each medication shipped and additional FDA Medication Guides as required.       DISEASE/MEDICATION-SPECIFIC INFORMATION        For CF patients: CF [facility] Active? Yes    SPECIALTY MEDICATION ADHERENCE     Medication Adherence    Patient reported X missed doses in the last month: 0  Specialty Medication: colistimethate 150 mg injection (COLYMYCIN)  Patient is on additional specialty medications: No  Informant: patient  Support network for adherence: family member              Were doses missed due to medication being on hold? No    colistimethate 150 mg injection (COLYMYCIN)  : at least 8 days of medicine on hand (Patient says he believes he has enough to last until September 1)       REFERRAL TO PHARMACIST     Referral to the pharmacist: Not needed      [facility]     Shipping address confirmed in Epic.       Delivery Scheduled: Yes, Expected medication delivery date: 11/18/22.     Medication will be delivered via UPS to the prescription address in Epic WAM.    Alwyn Pea   [facility] Specialty Technician

## 2022-11-13 NOTE — Unmapped (Signed)
General Pulmonary Team Follow Up Consult Note     Date of Service: 11/13/2022  Requesting Physician: Hubbard Robinson, MD   Requesting Service: Med Undesignated (MDX)  Reason for consultation: Comprehensive evaluation of  bronchiectasis exacerbation in the setting of chronic hypoxemic respiratory failure and PsA colonization .    Hospital Problems:  Principal Problem:    Acute exacerbation of bronchiectasis (CMS-HCC)  Active Problems:    Essential hypertension    Chronic respiratory failure with hypoxia (CMS-HCC)      HPI: Jordan [name] is a 67 y.o. male with bronchiectasis with chronic hypoxemic respiratory failure, Pseudomonas colonization, and prior history of lung resections.  I saw him in clinic on 10/23/2022 when he reported more frequent spells of chest tightness, wheezing, and difficulty expectorating sputum.  Based on this, we agreed to schedule an admission for IV antibiotics and aggressive airway clearance using alternative modalities to those that he uses at home.  No interval changes since I saw him in clinic aside from [place] and [place].  He had a good time.    Problems addressed during this consult include bronchiectasis exacerbation in the setting of chronic hypoxemic respiratory failure and Pseudomonas colonization. Based on these problems, the patient has moderate risk of morbidity/mortality which is commensurate w their risk of management options described below in the recommendations.    Assessment      Interval Events:  No overnight events. Feels like he is coughing less though still clearing sputum. Overall improving.        Impression: The patient has made significant clinical progress since last encounter. I personally reviewed most recent pertinent labs, imaging and micro data, from 11/13/2022, which are noted in recommendations.         Recommendations   Antibiotics:  Based on prior culture data (OPF, PsA) and allergy profile, continue treating with Zosyn IV, Cipro 750 mg oral, and [name] nebs.  Single lumen PICC line placed for 2-3 weeks of IV antibiotics. Duration to be determined based on clinical response and improvement in inflammatory markers.  Continue chronic azithromycin for anti-inflammatory effect.     Other medications:  Trelegy converted to Duonebs (pre-treatment before hypertonic saline), budesonide 0.5 mg bid, and arformoterol 15 mcg bid.     Airway clearance: Continue aggressive airway clearance 4 times per day.  Nebulized hypertonic saline 10%; pre-treatment with albuterol/ipratropium.  For mechanical clearance, continue Metaneb and Vibralung. He will let me know if he likes the metaneb enough to pursue similar device for home use.  Continue daily Pulmozyme.  Postural drainage     Labs:  Admission labs done. CBC-D notable for elevated absolute eosinophils to 0.5. CMP WNL.  CRP elevated to 12.0 and ESR elevated to 52.   Sputum for bacterial and AFB cultures (NTM screening) in process. Lower respiratory culture preliminary with 2+ OPF and 1+ mPsA.  Weekly CRP and ESR to trend (including as outpatient). Due on Saturday.  [facility] Guideline for Monitoring Outpatient Intravenous and Oral Antimicrobial Therapy recommended monitoring labs ([email or web address]    Consult:  If he continues to have right calf pain or it worsens, consider physical therapy consultation     Imaging:  CXR as noted below.     Other Testing:  Flow volume loop on Tuesday prior to discharge.  EKG after a few days of Cipro and azithromycin - done 11/10/22 and WNL.     Dispo:   Patient has a follow-up appointment with  me scheduled for 01/26/23.  Anticipate discharge on Tuesday, August 27 after PFTs.    Please reach out to [facility] Bronchiectasis/NTM Care and [facility] team (myself, Alvino Blood RN, Eliberto Ivory CPP) prior to discharge to ensure that home infusion orders are correct and that we are comfortable with patient discharging home on IVs at that time.  We will follow monitoring labs.  Please use [email or web address] for home infusion orders. We will follow monitoring labs. Does not need OPAT.  Do not discharge Friday through Sunday or on a holiday as no outpatient clinical staff available to assist if problems arise with home health/home infusion.   I provided him a form for the pharmacy assistance program to assist with the co-pay cost of his Trelegy.    The recommendations outlined in this note were discussed w the primary team via epic chat. Please do not hesitate to page [phone] [facility] consult) with questions. We appreciate the opportunity to assist in the care of this patient. We look forward to following with you.    Attestation      I directly provided consultative services as documented in this note and I personally spent 35 minutes face-to-face and non face-to-face in the care of this patient, which includes all pre, intra and post visit time on the date of service including examining the patient, evaluating/interpreting objective clinical data, coordinating care with the entire multidisciplinary care team and developing a comprehensive management plan as outlined above. All documented time was specific to the E/M visit and does not include any procedures that may have been performed.      Viona Gilmore, MD    Subjective & Objective     Vitals - past 24 hours  Temp:  [36.2 ??C (97.2 ??F)-36.8 ??C (98.2 ??F)] 36.2 ??C (97.2 ??F)  Heart Rate:  [72-97] 72  Resp:  [18] 18  BP: (108-132)/(59-66) 108/59  SpO2:  [95 %-100 %] 95 % Intake/Output  I/O last 3 completed shifts:  In: 680 [P.O.:380; IV Piggyback:300]  Out: 500 [Urine:500]      Pertinent exam findings:   General appearance - well appearing, well nourished, and in no distress  Eyes - EOMI, PERRLA, anicteric sclerae, pink conjunctiva  Mouth - moist mucous membranes, no pharyngeal erythema or exudates  Neck - Trachea midline, normal neck movement  Lymphatics - not examined  Heart - regular rate and rhythm, normal S1/S2, no gallops, rubs, or murmurs  Chest -absent breath sounds over the right middle and lower lobe, corresponding with prior resection.  Reduced inspiratory and expiratory crackles over left lower lung field. Minimal crackles over left mid/upper lung fields.  Improved air entry over right upper lobe.  Easy work of breathing without accessory muscle use.  Speaking in full sentences without difficulty.  Abdomen - not examined  Extremities - Minimal swelling of posterior right calf with mild residual tenderness.  Clubbing of fingers.  No cyanosis.  Skin - normal coloration and turgor, no rashes, no suspicious skin lesions noted  Neurological - alert, oriented, normal speech, no focal findings or movement disorder noted    Relevant Imaging:  - CXR on 11/07/2022 revealed stable architectural distortion, volume loss, and bronchiectasis in the right upper lobe as well as stable extensive bronchiectasis and bronchial wall thickening of the left lung.    Arterial Blood Gas:   No results for input(s): SPECTYPEART, PHART, PCO2ART, PO2ART, HCO3ART, BEART, O2SATART in the last 24 hours.     Venous Blood Gas:  No results for input(s): PHVEN, PCO2VEN, PO2VEN, HCO3VEN, BEVEN, O2SATVEN in the last 24 hours.     Cultures:  Lower Respiratory Culture (no units)   Date Value   11/07/2022 1+ Mucoid Pseudomonas aeruginosa (A)   11/07/2022 2+ Oropharyngeal Flora Isolated   11/07/2022 1+ Smooth Pseudomonas aeruginosa (A)     WBC (10*9/L)   Date Value   11/12/2022 6.7          Other Labs:  Lab Results   Component Value Date    WBC 6.7 11/12/2022    HGB 12.2 (L) 11/12/2022    HCT 36.2 (L) 11/12/2022    PLT 154 11/12/2022     Lab Results   Component Value Date    NA 141 11/12/2022    K 3.6 11/12/2022    CL 106 11/12/2022    CO2 31.7 (H) 11/12/2022    BUN 19 11/12/2022    CREATININE 1.08 11/12/2022    GLU 88 11/12/2022    CALCIUM 9.2 11/12/2022    MG 1.5 (L) 12/22/2021    PHOS 3.7 01/22/2016     Lab Results   Component Value Date    BILITOT 0.4 11/07/2022    BILIDIR 0.40 04/02/2015    PROT 6.8 11/07/2022    ALBUMIN 3.4 11/07/2022    ALT 12 11/07/2022    AST 13 11/07/2022    ALKPHOS 84 11/07/2022    GGT 64 06/21/2013     Lab Results   Component Value Date    LABPROT 10.4 08/03/2013    INR 0.95 04/14/2017    APTT 32.6 07/25/2014       Allergies & Home Medications   Personally reviewed in Epic    Continuous Infusions:       Scheduled Medications:    arformoterol  15 mcg Nebulization BID (RT)    ascorbic acid (vitamin C)  1,000 mg Oral Daily    atorvastatin  10 mg Oral Daily    azithromycin  250 mg Oral Daily    budesonide (PULMICORT) nebulizer solution  0.5 mg Nebulization BID (RT)    calcium carbonate  600 mg elem calcium Oral BID    ciprofloxacin HCl  750 mg Oral BID    dilTIAZem  120 mg Oral Daily    docusate sodium  100 mg Oral Nightly    dornase alfa  2.5 mg Inhalation Daily    fluticasone propionate  1 spray Each Nare Daily    HYDROcodone-acetaminophen  1.5 tablet Oral Q8H    ipratropium-albuterol  3 mL Nebulization 4x Daily (RT)    montelukast  10 mg Oral Nightly    multivitamins (ADULT)  1 tablet Oral Daily    pantoprazole  20 mg Oral BID    piperacillin-tazobactam (ZOSYN) IV (intermittent)  4.5 g Intravenous Q6H    sodium chloride  10 mL Intravenous Q8H    sodium chloride  5 mL Nebulization 4x Daily (RT)    tobramycin (PF)  300 mg Nebulization BID (RT)       PRN medications:  alum-mag-simeth, guaiFENesin, melatonin, senna

## 2022-11-13 NOTE — Unmapped (Signed)
[facility] Hospitalist Daily Progress Note     LOS: 6 days     Assessment/Plan:  Principal Problem:    Acute exacerbation of bronchiectasis (CMS-HCC)  Active Problems:    Essential hypertension    Chronic respiratory failure with hypoxia (CMS-HCC)         Acute exacerbation of bronchiectasis (CMS-HCC)   Acute on chronic hypoxemic respiratory failure  Follows with Dr. Garner Nash from Pulmonary Medicine for bronchiectasis. History of MDR Pseudomonas colonization. On 2L O2 via [place] at baseline. Presented with increasing dyspnea and production of purulent sputum over the last month. 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 Developed peripheral neuropathy possibly 2/2 voriconazole. Stable/improved off treatment. Sputum growing both smooth and mucoid Pseudomonas.  Both are only sensitive to zosyn. Clinically he is improving.     - Pulmonology following  - Ciprofloxacin 750mg  po BID, Zosyn IV, Tobramycin nebs  - Continue home Azithromycin daily for anti-inflammatory affect  - Singulair qhs   - Hold home trelegy (hasn't been able to start this due to insurance/financial issues)  - Arformoterol BID  - Budesonide 0.5mg  BID  - Duonebs QID (pre-treatment for hypertonic saline)  - Airway clearance QID (RT)              - Hypertonic saline 10% nebs              - Mechanical clearance: Metaneb and Vibralung              - Daily Pulmozyme  - CRP and ESR weekly; continue on DC  - FU sputum culture and AFB culture  - Continuous O2 monitoring    Essential hypertension - Aortic Root Dilation   BP remains acceptable  - Diltiazem 120 qd  - Atorvastatin 10 qhs      Constipation  - Colace qhs, as per his request.   - Monitor for BMs and intensify if needed     GERD  - Pantoprazole    - Norco q8h     DVT prophylaxis. Ambulatory      Disposition: Floor. DC to home early next week      Please page the [facility]) pager at [phone] with questions.      Pending labs: Pending Labs       Order Current Status    AFB culture Preliminary result    Lower Respiratory Culture Preliminary result            Subjective:   Overall feeling better compared to admission. No new complaints this morning.  Notes increased SpO2.     Objective:   Physical Exam:    GEN: NAD, sitting up to chair, watching TV.    RESPIRATORY: Breathing pattern was normal and the chest moved symmetrically.  Lung sounds were diminished on the right.    CARDIOVASCULAR: Heart rate and rhythm were normal.  S1 and S2 were normal and there were no extra sounds or murmurs.   ABDOMEN: The abdomen was normal in contour.  Bowel sounds were present.  Palpation detected no tenderness.   NEUROLOGIC: Mental status was normal.   The patient was normally coordinated in the arms  and legs.   PSYCHIATRIC: The patient was oriented to person, place, time, and circumstance. Speech was normal. Mood and affect were normal     Vital signs in last 24 hours:  Temp:  [36.3 ??C (97.3 ??F)-36.8 ??C (98.2 ??F)] 36.8 ??C (98.2 ??F)  Heart Rate:  [78-97] 97  Resp:  [18] 18  BP: (114-132)/(63-66) 123/66  MAP (mmHg):  [83-88] 87  SpO2:  [95 %-100 %] 100 %    Intake/Output last 24 hours:    Intake/Output Summary (Last 24 hours) at 11/13/2022 0749  Last data filed at 11/13/2022 0602  Gross per 24 hour   Intake 300 ml   Output 500 ml   Net -200 ml       Medications:   Scheduled Meds:   arformoterol  15 mcg Nebulization BID (RT)    ascorbic acid (vitamin C)  1,000 mg Oral Daily    atorvastatin  10 mg Oral Daily    azithromycin  250 mg Oral Daily    budesonide (PULMICORT) nebulizer solution  0.5 mg Nebulization BID (RT)    calcium carbonate  600 mg elem calcium Oral BID    ciprofloxacin HCl  750 mg Oral BID    dilTIAZem  120 mg Oral Daily    docusate sodium  100 mg Oral Nightly    dornase alfa  2.5 mg Inhalation Daily    fluticasone propionate  1 spray Each Nare Daily    HYDROcodone-acetaminophen  1.5 tablet Oral Q8H    ipratropium-albuterol  3 mL Nebulization 4x Daily (RT)    montelukast  10 mg Oral Nightly    multivitamins (ADULT)  1 tablet Oral Daily    pantoprazole  20 mg Oral BID    piperacillin-tazobactam (ZOSYN) IV (intermittent)  4.5 g Intravenous Q6H    sodium chloride  10 mL Intravenous Q8H    sodium chloride  5 mL Nebulization 4x Daily (RT)    tobramycin (PF)  300 mg Nebulization BID (RT)     Continuous Infusions:    D Linford Arnold, MD MD  [facility] Service.

## 2022-11-13 NOTE — Unmapped (Signed)
Patient was compliant with all breathing treatments via Metaneb and Vibralung for airway clearance.  Remains stable on 3L [place].    Problem: Airway Clearance Ineffective  Goal: Effective Airway Clearance  Outcome: Progressing

## 2022-11-13 NOTE — Unmapped (Signed)
A&Ox4 and VSS during this shift. IV antibiotic con't per ordered and tolerated well. Falls and injuries free. Pt is currently sleeping. Plan of care ongoing.      Problem: Adult Inpatient Plan of Care  Goal: Plan of Care Review  Outcome: Progressing  Flowsheets (Taken 11/13/2022 0446)  Progress: improving  Plan of Care Reviewed With: patient  Goal: Patient-Specific Goal (Individualized)  Outcome: Progressing  Goal: Absence of Hospital-Acquired Illness or Injury  Intervention: Identify and Manage Fall Risk  Recent Flowsheet Documentation  Taken 11/13/2022 0400 by Hillery Aldo, RN  Safety Interventions:   fall reduction program maintained   low bed   isolation precautions   nonskid shoes/slippers when out of bed  Taken 11/13/2022 0200 by Hillery Aldo, RN  Safety Interventions:   fall reduction program maintained   isolation precautions   low bed   nonskid shoes/slippers when out of bed  Taken 11/13/2022 0000 by Hillery Aldo, RN  Safety Interventions:   fall reduction program maintained   low bed   isolation precautions   nonskid shoes/slippers when out of bed  Taken 11/12/2022 2200 by Hillery Aldo, RN  Safety Interventions:   fall reduction program maintained   isolation precautions   low bed   nonskid shoes/slippers when out of bed  Taken 11/12/2022 2000 by Hillery Aldo, RN  Safety Interventions:   fall reduction program maintained   isolation precautions   low bed   nonskid shoes/slippers when out of bed  Intervention: Prevent Skin Injury  Recent Flowsheet Documentation  Taken 11/13/2022 0400 by Hillery Aldo, RN  Positioning for Skin: Supine/Back  Taken 11/13/2022 0200 by Hillery Aldo, RN  Positioning for Skin: Right  Taken 11/13/2022 0000 by Hillery Aldo, RN  Positioning for Skin: Left  Taken 11/12/2022 2200 by Hillery Aldo, RN  Positioning for Skin: Standing  Taken 11/12/2022 2000 by Hillery Aldo, RN  Positioning for Skin: Supine/Back  Intervention: Prevent and Manage VTE (Venous Thromboembolism) Risk  Recent Flowsheet Documentation  Taken 11/13/2022 0400 by Hillery Aldo, RN  Anti-Embolism Intervention: (low risk per MD) Other (Comment)  Taken 11/13/2022 0200 by Hillery Aldo, RN  Anti-Embolism Intervention: (low risk per MD) Other (Comment)  Taken 11/13/2022 0000 by Hillery Aldo, RN  Anti-Embolism Intervention: (low risk per MD) Other (Comment)  Taken 11/12/2022 2200 by Hillery Aldo, RN  Anti-Embolism Intervention: (low risk per MD) Other (Comment)  Taken 11/12/2022 2000 by Hillery Aldo, RN  Anti-Embolism Intervention: (low risk per MD) Other (Comment)     Problem: Infection  Goal: Absence of Infection Signs and Symptoms  Outcome: Progressing  Intervention: Prevent or Manage Infection  Recent Flowsheet Documentation  Taken 11/13/2022 0400 by Hillery Aldo, RN  Isolation Precautions: contact precautions maintained  Taken 11/13/2022 0200 by Hillery Aldo, RN  Isolation Precautions: contact precautions maintained  Taken 11/13/2022 0000 by Hillery Aldo, RN  Isolation Precautions: contact precautions maintained  Taken 11/12/2022 2200 by Hillery Aldo, RN  Isolation Precautions: contact precautions maintained  Taken 11/12/2022 2000 by Hillery Aldo, RN  Isolation Precautions: contact precautions maintained

## 2022-11-13 NOTE — Unmapped (Signed)
Problem: Adult Inpatient Plan of Care  Goal: Plan of Care Review  Outcome: Progressing     Problem: Airway Clearance Ineffective  Goal: Effective Airway Clearance  Intervention: Promote Airway Secretion Clearance  Recent Flowsheet Documentation  Taken 11/12/2022 1959 by Adriana Simas C  Breathing Techniques/Airway Clearance: deep/controlled cough encouraged   Pt stable through night and received all txs via airway clearance devices metaneb and vibralung. Will continue to monitor.

## 2022-11-14 DIAGNOSIS — J9611 Chronic respiratory failure with hypoxia: Secondary | ICD-10-CM | POA: Diagnosis not present

## 2022-11-14 DIAGNOSIS — J471 Bronchiectasis with (acute) exacerbation: Secondary | ICD-10-CM | POA: Diagnosis not present

## 2022-11-14 LAB — C-REACTIVE PROTEIN: C-REACTIVE PROTEIN: 18 mg/L — ABNORMAL HIGH (ref <=10.0)

## 2022-11-14 LAB — SEDIMENTATION RATE: ERYTHROCYTE SEDIMENTATION RATE: 51 mm/h — ABNORMAL HIGH (ref 0–20)

## 2022-11-14 MED ADMIN — montelukast (SINGULAIR) tablet 10 mg: 10 mg | ORAL | @ 01:00:00

## 2022-11-14 MED ADMIN — sodium chloride (NS) 0.9 % flush 10 mL: 10 mL | INTRAVENOUS | @ 23:00:00

## 2022-11-14 MED ADMIN — budesonide (PULMICORT) nebulizer solution 0.5 mg: .5 mg | RESPIRATORY_TRACT | @ 12:00:00

## 2022-11-14 MED ADMIN — piperacillin-tazobactam (ZOSYN) IVPB (premix) 4.5 g: 4.5 g | INTRAVENOUS | @ 21:00:00 | Stop: 2022-11-18

## 2022-11-14 MED ADMIN — atorvastatin (LIPITOR) tablet 10 mg: 10 mg | ORAL | @ 13:00:00

## 2022-11-14 MED ADMIN — sodium chloride 10 % NEBULIZER solution 5 mL: 5 mL | RESPIRATORY_TRACT | @ 16:00:00

## 2022-11-14 MED ADMIN — guaiFENesin (ROBITUSSIN) oral syrup: 200 mg | ORAL | @ 01:00:00

## 2022-11-14 MED ADMIN — azithromycin (ZITHROMAX) tablet 250 mg: 250 mg | ORAL | @ 13:00:00 | Stop: 2022-11-18

## 2022-11-14 MED ADMIN — arformoterol (BROVANA) nebulizer solution 15 mcg/2 mL: 15 ug | RESPIRATORY_TRACT | @ 12:00:00

## 2022-11-14 MED ADMIN — pantoprazole (Protonix) EC tablet 20 mg: 20 mg | ORAL | @ 13:00:00

## 2022-11-14 MED ADMIN — ciprofloxacin HCl (CIPRO) tablet 750 mg: 750 mg | ORAL | @ 10:00:00 | Stop: 2022-11-18

## 2022-11-14 MED ADMIN — tobramycin (PF) (TOBI) 300 mg/5 mL nebulizer solution 300 mg: 300 mg | RESPIRATORY_TRACT | @ 12:00:00 | Stop: 2022-11-18

## 2022-11-14 MED ADMIN — calcium carbonate tablet 600 mg elem calcium: 600 mg | ORAL | @ 01:00:00

## 2022-11-14 MED ADMIN — budesonide (PULMICORT) nebulizer solution 0.5 mg: .5 mg | RESPIRATORY_TRACT

## 2022-11-14 MED ADMIN — HYDROcodone-acetaminophen (NORCO) 5-325 mg per tablet 1.5 tablet: 1.5 | ORAL | @ 10:00:00 | Stop: 2022-11-21

## 2022-11-14 MED ADMIN — sodium chloride 10 % NEBULIZER solution 5 mL: 5 mL | RESPIRATORY_TRACT | @ 12:00:00

## 2022-11-14 MED ADMIN — fluticasone propionate (FLONASE) 50 mcg/actuation nasal spray 1 spray: 1 | NASAL | @ 13:00:00

## 2022-11-14 MED ADMIN — ipratropium-albuterol (DUO-NEB) 0.5-2.5 mg/3 mL nebulizer solution 3 mL: 3 mL | RESPIRATORY_TRACT

## 2022-11-14 MED ADMIN — sodium chloride (NS) 0.9 % flush 10 mL: 10 mL | INTRAVENOUS | @ 07:00:00

## 2022-11-14 MED ADMIN — calcium carbonate tablet 600 mg elem calcium: 600 mg | ORAL | @ 13:00:00

## 2022-11-14 MED ADMIN — ipratropium-albuterol (DUO-NEB) 0.5-2.5 mg/3 mL nebulizer solution 3 mL: 3 mL | RESPIRATORY_TRACT | @ 16:00:00

## 2022-11-14 MED ADMIN — dornase alfa (PULMOZYME) 1 mg/mL solution 2.5 mg: 2.5 mg | RESPIRATORY_TRACT | @ 12:00:00

## 2022-11-14 MED ADMIN — dilTIAZem (CARDIZEM CD) 24 hr capsule 120 mg: 120 mg | ORAL | @ 13:00:00

## 2022-11-14 MED ADMIN — ipratropium-albuterol (DUO-NEB) 0.5-2.5 mg/3 mL nebulizer solution 3 mL: 3 mL | RESPIRATORY_TRACT | @ 20:00:00

## 2022-11-14 MED ADMIN — arformoterol (BROVANA) nebulizer solution 15 mcg/2 mL: 15 ug | RESPIRATORY_TRACT

## 2022-11-14 MED ADMIN — ascorbic acid (vitamin C) (VITAMIN C) tablet 1,000 mg: 1000 mg | ORAL | @ 13:00:00

## 2022-11-14 MED ADMIN — tobramycin (PF) (TOBI) 300 mg/5 mL nebulizer solution 300 mg: 300 mg | RESPIRATORY_TRACT | Stop: 2022-11-18

## 2022-11-14 MED ADMIN — piperacillin-tazobactam (ZOSYN) IVPB (premix) 4.5 g: 4.5 g | INTRAVENOUS | @ 04:00:00 | Stop: 2022-11-18

## 2022-11-14 MED ADMIN — piperacillin-tazobactam (ZOSYN) IVPB (premix) 4.5 g: 4.5 g | INTRAVENOUS | @ 16:00:00 | Stop: 2022-11-18

## 2022-11-14 MED ADMIN — HYDROcodone-acetaminophen (NORCO) 5-325 mg per tablet 1.5 tablet: 1.5 | ORAL | @ 18:00:00 | Stop: 2022-11-21

## 2022-11-14 MED ADMIN — pantoprazole (Protonix) EC tablet 20 mg: 20 mg | ORAL | @ 01:00:00

## 2022-11-14 MED ADMIN — piperacillin-tazobactam (ZOSYN) IVPB (premix) 4.5 g: 4.5 g | INTRAVENOUS | @ 10:00:00 | Stop: 2022-11-18

## 2022-11-14 MED ADMIN — multivitamin with folic acid 400 mcg tablet 1 tablet: 1 | ORAL | @ 13:00:00

## 2022-11-14 MED ADMIN — ipratropium-albuterol (DUO-NEB) 0.5-2.5 mg/3 mL nebulizer solution 3 mL: 3 mL | RESPIRATORY_TRACT | @ 12:00:00

## 2022-11-14 MED ADMIN — sodium chloride 10 % NEBULIZER solution 5 mL: 5 mL | RESPIRATORY_TRACT | @ 20:00:00

## 2022-11-14 MED ADMIN — sodium chloride (NS) 0.9 % flush 10 mL: 10 mL | INTRAVENOUS | @ 16:00:00

## 2022-11-14 MED ADMIN — HYDROcodone-acetaminophen (NORCO) 5-325 mg per tablet 1.5 tablet: 1.5 | ORAL | @ 01:00:00 | Stop: 2022-11-21

## 2022-11-14 MED ADMIN — sodium chloride (NS) 0.9 % flush 10 mL: 10 mL | INTRAVENOUS | @ 01:00:00

## 2022-11-14 MED ADMIN — ciprofloxacin HCl (CIPRO) tablet 750 mg: 750 mg | ORAL | @ 21:00:00 | Stop: 2022-11-18

## 2022-11-14 NOTE — Unmapped (Signed)
[facility] Hospitalist Daily Progress Note     LOS: 7 days     Assessment/Plan:  Principal Problem:    Acute exacerbation of bronchiectasis (CMS-HCC)  Active Problems:    Essential hypertension    Chronic respiratory failure with hypoxia (CMS-HCC)         Acute exacerbation of bronchiectasis (CMS-HCC)   Acute on chronic hypoxemic respiratory failure  Follows with Dr. Garner Nash from Pulmonary Medicine for bronchiectasis. History of MDR Pseudomonas colonization. On 2L O2 via [place] at baseline. Presented with increasing dyspnea and production of purulent sputum over the last month. 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 Developed peripheral neuropathy possibly 2/2 voriconazole, that was stable/improved off treatment. Sputum culture growing both smooth and mucoid Pseudomonas.  Both are only sensitive to zosyn.  ESR down to 51, and CRP up  to 18.   - Pulmonology following  - Ciprofloxacin 750mg  po BID, Zosyn IV, Tobramycin nebs  - Continue home Azithromycin daily for anti-inflammatory affect  - Singulair qhs   - Hold home trelegy (hasn't been able to start this due to insurance/financial issues)  - Arformoterol BID  - Budesonide 0.5mg  BID  - Duonebs QID (pre-treatment for hypertonic saline)  - Airway clearance QID (RT)              - Hypertonic saline 10% nebs              - Mechanical clearance: Metaneb and Vibralung              - Daily Pulmozyme  - CRP and ESR weekly; continue on DC  - FU AFB culture  - Continuous O2 monitoring    Essential hypertension - Aortic Root Dilation   BP remains acceptable  - Diltiazem 120 qd  - Atorvastatin 10 qhs      Constipation  - Colace qhs,   - Monitor for BMs and intensify if needed     GERD  - Pantoprazole    - Norco q8h     DVT prophylaxis. Ambulatory      Disposition: Floor. DC to home early next week      Please page the [facility]) pager at [phone] with questions.      Pending labs:   Pending Labs Order Current Status    AFB culture Preliminary result            Subjective:   Feeling well enough this morning.  Going to watch Golf today. Breathing similar to yesterday.     Objective:   Physical Exam:    GEN: NAD, sitting up to chair.   RESPIRATORY: Breathing pattern was normal and the chest moved symmetrically.  Lung sounds were diminished on the right.    CARDIOVASCULAR: Heart rate and rhythm were normal.  S1 and S2 were normal and there were no extra sounds or murmurs.   ABDOMEN: The abdomen was normal in contour.  Bowel sounds were present.  Palpation detected no tenderness.   NEUROLOGIC: Mental status was normal.   The patient was normally coordinated in the arms  and legs.   PSYCHIATRIC: The patient was oriented to person, place, time, and circumstance. Speech was normal. Mood and affect were normal     Vital signs in last 24 hours:  Temp:  [36.2 ??C (97.2 ??F)-36.5 ??C (97.7 ??F)] 36.4 ??C (97.5 ??F)  Heart Rate:  [72-85] 85  Resp:  [18] 18  BP: (108-115)/(57-62)  108/62  MAP (mmHg):  [78-80] 80  SpO2:  [95 %-98 %] 98 %    Intake/Output last 24 hours:    Intake/Output Summary (Last 24 hours) at 11/14/2022 0730  Last data filed at 11/14/2022 0981  Gross per 24 hour   Intake 1380 ml   Output 475 ml   Net 905 ml       Medications:   Scheduled Meds:   arformoterol  15 mcg Nebulization BID (RT)    ascorbic acid (vitamin C)  1,000 mg Oral Daily    atorvastatin  10 mg Oral Daily    azithromycin  250 mg Oral Daily    budesonide (PULMICORT) nebulizer solution  0.5 mg Nebulization BID (RT)    calcium carbonate  600 mg elem calcium Oral BID    ciprofloxacin HCl  750 mg Oral BID    dilTIAZem  120 mg Oral Daily    docusate sodium  100 mg Oral Nightly    dornase alfa  2.5 mg Inhalation Daily    fluticasone propionate  1 spray Each Nare Daily    HYDROcodone-acetaminophen  1.5 tablet Oral Q8H    ipratropium-albuterol  3 mL Nebulization 4x Daily (RT)    montelukast  10 mg Oral Nightly    multivitamins (ADULT)  1 tablet Oral Daily pantoprazole  20 mg Oral BID    piperacillin-tazobactam (ZOSYN) IV (intermittent)  4.5 g Intravenous Q6H    sodium chloride  10 mL Intravenous Q8H    sodium chloride  5 mL Nebulization 4x Daily (RT)    tobramycin (PF)  300 mg Nebulization BID (RT)     Continuous Infusions:    D Linford Arnold, MD MD  [facility].

## 2022-11-14 NOTE — Unmapped (Signed)
Problem: Adult Inpatient Plan of Care  Goal: Plan of Care Review  Outcome: Progressing  Goal: Patient-Specific Goal (Individualized)  Outcome: Progressing  Goal: Absence of Hospital-Acquired Illness or Injury  Outcome: Progressing  Intervention: Identify and Manage Fall Risk  Recent Flowsheet Documentation  Taken 11/14/2022 0000 by Johnn Hai, RN  Safety Interventions:   low bed   fall reduction program maintained   isolation precautions   commode/urinal/bedpan at bedside   nonskid shoes/slippers when out of bed   room near unit station  Taken 11/13/2022 2200 by Johnn Hai, RN  Safety Interventions:   low bed   fall reduction program maintained   room near unit station   nonskid shoes/slippers when out of bed  Taken 11/13/2022 2030 by Johnn Hai, RN  Safety Interventions:   low bed   fall reduction program maintained   room near unit station   nonskid shoes/slippers when out of bed   commode/urinal/bedpan at bedside  Intervention: Prevent Skin Injury  Recent Flowsheet Documentation  Taken 11/14/2022 0000 by Johnn Hai, RN  Positioning for Skin: Left  Taken 11/13/2022 2200 by Johnn Hai, RN  Positioning for Skin: Sitting in Chair  Taken 11/13/2022 2030 by Johnn Hai, RN  Positioning for Skin: Sitting in Chair  Skin Protection: incontinence pads utilized  Goal: Optimal Comfort and Wellbeing  Outcome: Progressing  Goal: Readiness for Transition of Care  Outcome: Progressing  Goal: Rounds/Family Conference  Outcome: Progressing     Problem: Infection  Goal: Absence of Infection Signs and Symptoms  Outcome: Progressing  Intervention: Prevent or Manage Infection  Recent Flowsheet Documentation  Taken 11/14/2022 0000 by Johnn Hai, RN  Isolation Precautions: contact precautions maintained  Taken 11/13/2022 2200 by Johnn Hai, RN  Isolation Precautions: contact precautions maintained     Problem: Fall Injury Risk  Goal: Absence of Fall and Fall-Related Injury  Outcome: Progressing  Intervention: Promote Scientist, clinical (histocompatibility and immunogenetics) Documentation  Taken 11/14/2022 0000 by Johnn Hai, RN  Safety Interventions:   low bed   fall reduction program maintained   isolation precautions   commode/urinal/bedpan at bedside   nonskid shoes/slippers when out of bed   room near unit station  Taken 11/13/2022 2200 by Johnn Hai, RN  Safety Interventions:   low bed   fall reduction program maintained   room near unit station   nonskid shoes/slippers when out of bed  Taken 11/13/2022 2030 by Johnn Hai, RN  Safety Interventions:   low bed   fall reduction program maintained   room near unit station   nonskid shoes/slippers when out of bed   commode/urinal/bedpan at bedside     Problem: Pain Acute  Goal: Optimal Pain Control and Function  Outcome: Progressing     Problem: Skin Injury Risk Increased  Goal: Skin Health and Integrity  Outcome: Progressing  Intervention: Optimize Skin Protection  Recent Flowsheet Documentation  Taken 11/14/2022 0000 by Johnn Hai, RN  Activity Management: up ad lib  Pressure Reduction Techniques: frequent weight shift encouraged  Head of Bed [facility]) Positioning: HOB at 20-30 degrees  Taken 11/13/2022 2200 by Johnn Hai, RN  Activity Management: up in chair  Pressure Reduction Techniques:   frequent weight shift encouraged   pressure points protected  Pressure Reduction Devices:   pressure-redistributing mattress utilized   positioning supports utilized  Taken 11/13/2022 2030 by Johnn Hai, RN  Activity Management: up in chair  Pressure Reduction Techniques:   frequent weight shift encouraged   pressure points protected  Pressure  Reduction Devices: positioning supports utilized  Skin Protection: incontinence pads utilized     Problem: Journalist, newspaper Impaired  Goal: Optimal Gas Exchange  Outcome: Progressing  Intervention: Optimize Oxygenation and Ventilation  Recent Flowsheet Documentation  Taken 11/14/2022 0000 by Johnn Hai, RN  Head of Bed (HOB) Positioning: HOB at 20-30 degrees     Problem: Artificial Airway  Goal: Effective Communication  Outcome: Progressing  Goal: Optimal Device Function  Outcome: Progressing  Goal: Absence of Device-Related Skin or Tissue Injury  Outcome: Progressing     Problem: Airway Clearance Ineffective  Goal: Effective Airway Clearance  Outcome: Progressing  Intervention: Promote Airway Secretion Clearance  Recent Flowsheet Documentation  Taken 11/14/2022 0000 by Johnn Hai, RN  Activity Management: up ad lib  Taken 11/13/2022 2200 by Johnn Hai, RN  Activity Management: up in chair  Taken 11/13/2022 2030 by Johnn Hai, RN  Activity Management: up in chair

## 2022-11-14 NOTE — Unmapped (Signed)
General Pulmonary Team Follow Up Consult Note     Date of Service: 11/14/2022  Requesting Physician: Hubbard Robinson, MD   Requesting Service: Med Undesignated (MDX)  Reason for consultation: Comprehensive evaluation of  bronchiectasis exacerbation in the setting of chronic hypoxemic respiratory failure and PsA colonization .    Hospital Problems:  Principal Problem:    Acute exacerbation of bronchiectasis (CMS-HCC)  Active Problems:    Essential hypertension    Chronic respiratory failure with hypoxia (CMS-HCC)      HPI: Jordan [name] is a 67 y.o. male with bronchiectasis with chronic hypoxemic respiratory failure, Pseudomonas colonization, and prior history of lung resections.  Seen in clinic on 10/23/2022 when he reported more frequent spells of chest tightness, wheezing, and difficulty expectorating sputum.  Based on this, was scheduled for admission for IV antibiotics and aggressive airway clearance using alternative modalities to those that he uses at home.  No interval changes since clinic appointment aside [place].  He had a good time.    Problems addressed during this consult include bronchiectasis exacerbation in the setting of chronic hypoxemic respiratory failure and Pseudomonas colonization. Based on these problems, the patient has moderate risk of morbidity/mortality which is commensurate w their risk of management options described below in the recommendations.    Assessment      Interval Events:  No overnight events. Feels like he is coughing less though still clearing sputum. Overall improving.        Impression: The patient has made significant clinical progress since last encounter. I personally reviewed most recent pertinent labs, imaging and micro data, from 11/14/2022, which are noted in recommendations.         Recommendations   Antibiotics:  Based on prior culture data (OPF, PsA) and allergy profile, continue treating with Zosyn IV, Cipro 750 mg oral, and [name] nebs.  Single lumen PICC line placed for 2-3 weeks of IV antibiotics. Duration to be determined based on clinical response and improvement in inflammatory markers.  Continue chronic azithromycin for anti-inflammatory effect.     Other medications:  Trelegy converted to Duonebs (pre-treatment before hypertonic saline), budesonide 0.5 mg bid, and arformoterol 15 mcg bid.     Airway clearance: Continue aggressive airway clearance 4 times per day.  Nebulized hypertonic saline 10%; pre-treatment with albuterol/ipratropium.  For mechanical clearance, continue Metaneb and Vibralung. He will let me know if he likes the metaneb enough to pursue similar device for home use.  Continue daily Pulmozyme.  Postural drainage     Labs:  Admission labs done. CBC-D notable for elevated absolute eosinophils to 0.5. CMP WNL.  CRP elevated to 12.0 and ESR elevated to 52.   Sputum for bacterial and AFB cultures (NTM screening) in process. Lower respiratory culture preliminary with 2+ OPF and 1+ mPsA.  Weekly CRP and ESR to trend (including as outpatient). Due on Saturday.  [facility] Guideline for Monitoring Outpatient Intravenous and Oral Antimicrobial Therapy recommended monitoring labs ([email or web address])    Consult:  If he continues to have right calf pain or it worsens, consider physical therapy consultation     Imaging:  CXR as noted below.     Other Testing:  Flow volume loop on Tuesday prior to discharge.  EKG after a few days of Cipro and azithromycin - done 11/10/22 and WNL.     Dispo:   Patient has a follow-up appointment with me scheduled for 01/26/23.  Anticipate discharge  on Tuesday, August 27 after PFTs.    Please reach out to [facility] Bronchiectasis/NTM Care and [facility] team (myself, Alvino Blood RN, Eliberto Ivory CPP) prior to discharge to ensure that home infusion orders are correct and that we are comfortable with patient discharging home on IVs at that time.  We will follow monitoring labs.  Please use DOTPULMIVDISCHARGE for home infusion orders. We will follow monitoring labs. Does not need OPAT.  Do not discharge Friday through Sunday or on a holiday as no outpatient clinical staff available to assist if problems arise with home health/home infusion.   I provided him a form for the pharmacy assistance program to assist with the co-pay cost of his Trelegy.    The recommendations outlined in this note were discussed w the primary team via epic chat. Please do not hesitate to page [phone] [facility] consult) with questions. We appreciate the opportunity to assist in the care of this patient. We look forward to following with you.    Attestation      I directly provided consultative services as documented in this note and I personally spent 35 minutes face-to-face and non face-to-face in the care of this patient, which includes all pre, intra and post visit time on the date of service including examining the patient, evaluating/interpreting objective clinical data, coordinating care with the entire multidisciplinary care team and developing a comprehensive management plan as outlined above. All documented time was specific to the E/M visit and does not include any procedures that may have been performed.      Guido Sander, MD    Subjective & Objective     Vitals - past 24 hours  Temp:  [36.2 ??C (97.2 ??F)-36.5 ??C (97.7 ??F)] 36.4 ??C (97.5 ??F)  Heart Rate:  [72-85] 85  Resp:  [18] 18  BP: (108-115)/(57-62) 108/62  SpO2:  [95 %-98 %] 98 % Intake/Output  I/O last 3 completed shifts:  In: 1380 [P.O.:1080; IV Piggyback:300]  Out: 975 [Urine:975]      Pertinent exam findings:   General appearance - well appearing, well nourished, and in no distress  Eyes - EOMI, PERRLA, anicteric sclerae, pink conjunctiva  Mouth - moist mucous membranes, no pharyngeal erythema or exudates  Neck - Trachea midline, normal neck movement  Lymphatics - not examined  Heart - regular rate and rhythm, normal S1/S2, no gallops, rubs, or murmurs  Chest -absent breath sounds over the right middle and lower lobe, corresponding with prior resection.  Reduced inspiratory and expiratory crackles over left lower lung field. Minimal crackles over left mid/upper lung fields.  Improved air entry over right upper lobe.  Easy work of breathing without accessory muscle use.  Speaking in full sentences without difficulty.  Abdomen - not examined  Extremities - Minimal swelling of posterior right calf with mild residual tenderness.  Clubbing of fingers.  No cyanosis.  Skin - normal coloration and turgor, no rashes, no suspicious skin lesions noted  Neurological - alert, oriented, normal speech, no focal findings or movement disorder noted    Relevant Imaging:  - CXR on 11/07/2022 revealed stable architectural distortion, volume loss, and bronchiectasis in the right upper lobe as well as stable extensive bronchiectasis and bronchial wall thickening of the left lung.    Arterial Blood Gas:   No results for input(s): SPECTYPEART, PHART, PCO2ART, PO2ART, HCO3ART, BEART, O2SATART in the last 24 hours.     Venous Blood Gas:   No results for input(s): PHVEN, PCO2VEN, PO2VEN, HCO3VEN,  BEVEN, O2SATVEN in the last 24 hours.     Cultures:  Lower Respiratory Culture (no units)   Date Value   11/07/2022 1+ Mucoid Pseudomonas aeruginosa (A)   11/07/2022 2+ Oropharyngeal Flora Isolated   11/07/2022 1+ Smooth Pseudomonas aeruginosa (A)     WBC (10*9/L)   Date Value   11/12/2022 6.7          Other Labs:  Lab Results   Component Value Date    WBC 6.7 11/12/2022    HGB 12.2 (L) 11/12/2022    HCT 36.2 (L) 11/12/2022    PLT 154 11/12/2022     Lab Results   Component Value Date    NA 141 11/12/2022    K 3.6 11/12/2022    CL 106 11/12/2022    CO2 31.7 (H) 11/12/2022    BUN 19 11/12/2022    CREATININE 1.08 11/12/2022    GLU 88 11/12/2022    CALCIUM 9.2 11/12/2022    MG 1.5 (L) 12/22/2021    PHOS 3.7 01/22/2016     Lab Results   Component Value Date    BILITOT 0.4 11/07/2022    BILIDIR 0.40 04/02/2015    PROT 6.8 11/07/2022    ALBUMIN 3.4 11/07/2022    ALT 12 11/07/2022    AST 13 11/07/2022    ALKPHOS 84 11/07/2022    GGT 64 06/21/2013     Lab Results   Component Value Date    LABPROT 10.4 08/03/2013    INR 0.95 04/14/2017    APTT 32.6 07/25/2014       Allergies & Home Medications   Personally reviewed in Epic    Continuous Infusions:       Scheduled Medications:    arformoterol  15 mcg Nebulization BID (RT)    ascorbic acid (vitamin C)  1,000 mg Oral Daily    atorvastatin  10 mg Oral Daily    azithromycin  250 mg Oral Daily    budesonide (PULMICORT) nebulizer solution  0.5 mg Nebulization BID (RT)    calcium carbonate  600 mg elem calcium Oral BID    ciprofloxacin HCl  750 mg Oral BID    dilTIAZem  120 mg Oral Daily    docusate sodium  100 mg Oral Nightly    dornase alfa  2.5 mg Inhalation Daily    fluticasone propionate  1 spray Each Nare Daily    HYDROcodone-acetaminophen  1.5 tablet Oral Q8H    ipratropium-albuterol  3 mL Nebulization 4x Daily (RT)    montelukast  10 mg Oral Nightly    multivitamins (ADULT)  1 tablet Oral Daily    pantoprazole  20 mg Oral BID    piperacillin-tazobactam (ZOSYN) IV (intermittent)  4.5 g Intravenous Q6H    sodium chloride  10 mL Intravenous Q8H    sodium chloride  5 mL Nebulization 4x Daily (RT)    tobramycin (PF)  300 mg Nebulization BID (RT)       PRN medications:  alum-mag-simeth, guaiFENesin, melatonin, senna

## 2022-11-14 NOTE — Unmapped (Signed)
Problem: Gas Exchange Impaired  Goal: Optimal Gas Exchange  Outcome: Ongoing - Unchanged     Problem: Airway Clearance Ineffective  Goal: Effective Airway Clearance  Outcome: Ongoing - Unchanged  Intervention: Promote Airway Secretion Clearance  Recent Flowsheet Documentation  Taken 11/13/2022 1955 by Con Memos L, RRT  Breathing Techniques/Airway Clearance: deep/controlled cough encouraged  Taken 11/13/2022 1940 by Con Memos L, RRT  Breathing Techniques/Airway Clearance: deep/controlled cough encouraged     Problem: Airway Clearance Ineffective  Goal: Effective Airway Clearance  Outcome: Ongoing - Unchanged  Intervention: Promote Airway Secretion Clearance  Recent Flowsheet Documentation  Taken 11/13/2022 1955 by Con Memos L, RRT  Breathing Techniques/Airway Clearance: deep/controlled cough encouraged  Taken 11/13/2022 1940 by Con Memos L, RRT  Breathing Techniques/Airway Clearance: deep/controlled cough encouraged      All airway clearance and neb tx given as scheduled. No adverse reaction or respiratory distress noted

## 2022-11-14 NOTE — Unmapped (Signed)
Pt resting in bedside chair.  No report of pain or distress, [place] in place.  PICC flushes and draws without issue.  Call bell in reach.      Problem: Adult Inpatient Plan of Care  Goal: Plan of Care Review  Outcome: Progressing  Goal: Absence of Hospital-Acquired Illness or Injury  Intervention: Identify and Manage Fall Risk  Recent Flowsheet Documentation  Taken 11/14/2022 0800 by Marja Kays, RN  Safety Interventions:   low bed   fall reduction program maintained  Intervention: Prevent Skin Injury  Recent Flowsheet Documentation  Taken 11/14/2022 1000 by Marja Kays, RN  Positioning for Skin: Supine/Back  Taken 11/14/2022 0800 by Marja Kays, RN  Positioning for Skin: Supine/Back     Problem: Infection  Goal: Absence of Infection Signs and Symptoms  Outcome: Progressing  Intervention: Prevent or Manage Infection  Recent Flowsheet Documentation  Taken 11/14/2022 0800 by Marja Kays, RN  Isolation Precautions: contact precautions maintained     Problem: Fall Injury Risk  Goal: Absence of Fall and Fall-Related Injury  Outcome: Progressing  Intervention: Promote Injury-Free Environment  Recent Flowsheet Documentation  Taken 11/14/2022 0800 by Marja Kays, RN  Safety Interventions:   low bed   fall reduction program maintained     Problem: Skin Injury Risk Increased  Goal: Skin Health and Integrity  Outcome: Progressing  Intervention: Optimize Skin Protection  Recent Flowsheet Documentation  Taken 11/14/2022 1000 by Marja Kays, RN  Activity Management: up in chair  Pressure Reduction Techniques: frequent weight shift encouraged  Taken 11/14/2022 0800 by Marja Kays, RN  Activity Management: up in chair  Pressure Reduction Techniques: frequent weight shift encouraged

## 2022-11-15 DIAGNOSIS — J9611 Chronic respiratory failure with hypoxia: Secondary | ICD-10-CM | POA: Diagnosis not present

## 2022-11-15 DIAGNOSIS — B965 Pseudomonas (aeruginosa) (mallei) (pseudomallei) as the cause of diseases classified elsewhere: Secondary | ICD-10-CM | POA: Diagnosis not present

## 2022-11-15 DIAGNOSIS — J471 Bronchiectasis with (acute) exacerbation: Secondary | ICD-10-CM | POA: Diagnosis not present

## 2022-11-15 MED ADMIN — arformoterol (BROVANA) nebulizer solution 15 mcg/2 mL: 15 ug | RESPIRATORY_TRACT | @ 12:00:00

## 2022-11-15 MED ADMIN — sodium chloride 10 % NEBULIZER solution 5 mL: 5 mL | RESPIRATORY_TRACT | @ 12:00:00

## 2022-11-15 MED ADMIN — guaiFENesin (ROBITUSSIN) oral syrup: 200 mg | ORAL | @ 03:00:00

## 2022-11-15 MED ADMIN — ciprofloxacin HCl (CIPRO) tablet 750 mg: 750 mg | ORAL | @ 22:00:00 | Stop: 2022-11-18

## 2022-11-15 MED ADMIN — sodium chloride 10 % NEBULIZER solution 5 mL: 5 mL | RESPIRATORY_TRACT | @ 21:00:00

## 2022-11-15 MED ADMIN — fluticasone propionate (FLONASE) 50 mcg/actuation nasal spray 1 spray: 1 | NASAL | @ 15:00:00

## 2022-11-15 MED ADMIN — ipratropium-albuterol (DUO-NEB) 0.5-2.5 mg/3 mL nebulizer solution 3 mL: 3 mL | RESPIRATORY_TRACT | @ 12:00:00

## 2022-11-15 MED ADMIN — HYDROcodone-acetaminophen (NORCO) 5-325 mg per tablet 1.5 tablet: 1.5 | ORAL | @ 18:00:00 | Stop: 2022-11-21

## 2022-11-15 MED ADMIN — sodium chloride (NS) 0.9 % flush 10 mL: 10 mL | INTRAVENOUS | @ 22:00:00

## 2022-11-15 MED ADMIN — pantoprazole (Protonix) EC tablet 20 mg: 20 mg | ORAL | @ 13:00:00

## 2022-11-15 MED ADMIN — HYDROcodone-acetaminophen (NORCO) 5-325 mg per tablet 1.5 tablet: 1.5 | ORAL | @ 02:00:00 | Stop: 2022-11-21

## 2022-11-15 MED ADMIN — ciprofloxacin HCl (CIPRO) tablet 750 mg: 750 mg | ORAL | @ 10:00:00 | Stop: 2022-11-18

## 2022-11-15 MED ADMIN — tobramycin (PF) (TOBI) 300 mg/5 mL nebulizer solution 300 mg: 300 mg | RESPIRATORY_TRACT | @ 12:00:00 | Stop: 2022-11-18

## 2022-11-15 MED ADMIN — HYDROcodone-acetaminophen (NORCO) 5-325 mg per tablet 1.5 tablet: 1.5 | ORAL | @ 10:00:00 | Stop: 2022-11-21

## 2022-11-15 MED ADMIN — calcium carbonate tablet 600 mg elem calcium: 600 mg | ORAL | @ 13:00:00

## 2022-11-15 MED ADMIN — azithromycin (ZITHROMAX) tablet 250 mg: 250 mg | ORAL | @ 13:00:00 | Stop: 2022-11-18

## 2022-11-15 MED ADMIN — piperacillin-tazobactam (ZOSYN) IVPB (premix) 4.5 g: 4.5 g | INTRAVENOUS | @ 22:00:00 | Stop: 2022-11-18

## 2022-11-15 MED ADMIN — sodium chloride 10 % NEBULIZER solution 5 mL: 5 mL | RESPIRATORY_TRACT | @ 16:00:00

## 2022-11-15 MED ADMIN — budesonide (PULMICORT) nebulizer solution 0.5 mg: .5 mg | RESPIRATORY_TRACT | @ 12:00:00

## 2022-11-15 MED ADMIN — ipratropium-albuterol (DUO-NEB) 0.5-2.5 mg/3 mL nebulizer solution 3 mL: 3 mL | RESPIRATORY_TRACT | @ 21:00:00

## 2022-11-15 MED ADMIN — pantoprazole (Protonix) EC tablet 20 mg: 20 mg | ORAL

## 2022-11-15 MED ADMIN — montelukast (SINGULAIR) tablet 10 mg: 10 mg | ORAL

## 2022-11-15 MED ADMIN — piperacillin-tazobactam (ZOSYN) IVPB (premix) 4.5 g: 4.5 g | INTRAVENOUS | @ 10:00:00 | Stop: 2022-11-18

## 2022-11-15 MED ADMIN — dornase alfa (PULMOZYME) 1 mg/mL solution 2.5 mg: 2.5 mg | RESPIRATORY_TRACT | @ 12:00:00

## 2022-11-15 MED ADMIN — sodium chloride (NS) 0.9 % flush 10 mL: 10 mL | INTRAVENOUS | @ 06:00:00

## 2022-11-15 MED ADMIN — piperacillin-tazobactam (ZOSYN) IVPB (premix) 4.5 g: 4.5 g | INTRAVENOUS | @ 03:00:00 | Stop: 2022-11-18

## 2022-11-15 MED ADMIN — ipratropium-albuterol (DUO-NEB) 0.5-2.5 mg/3 mL nebulizer solution 3 mL: 3 mL | RESPIRATORY_TRACT | @ 16:00:00

## 2022-11-15 MED ADMIN — calcium carbonate tablet 600 mg elem calcium: 600 mg | ORAL

## 2022-11-15 MED ADMIN — sodium chloride (NS) 0.9 % flush 10 mL: 10 mL | INTRAVENOUS | @ 15:00:00

## 2022-11-15 MED ADMIN — multivitamin with folic acid 400 mcg tablet 1 tablet: 1 | ORAL | @ 13:00:00

## 2022-11-15 MED ADMIN — atorvastatin (LIPITOR) tablet 10 mg: 10 mg | ORAL | @ 13:00:00

## 2022-11-15 MED ADMIN — sodium chloride 10 % NEBULIZER solution 5 mL: 5 mL | RESPIRATORY_TRACT

## 2022-11-15 MED ADMIN — ascorbic acid (vitamin C) (VITAMIN C) tablet 1,000 mg: 1000 mg | ORAL | @ 13:00:00

## 2022-11-15 MED ADMIN — dilTIAZem (CARDIZEM CD) 24 hr capsule 120 mg: 120 mg | ORAL | @ 13:00:00

## 2022-11-15 MED ADMIN — piperacillin-tazobactam (ZOSYN) IVPB (premix) 4.5 g: 4.5 g | INTRAVENOUS | @ 15:00:00 | Stop: 2022-11-18

## 2022-11-15 NOTE — Unmapped (Signed)
General Pulmonary Team Follow Up Consult Note     Date of Service: 11/15/2022  Requesting Physician: Hubbard Robinson, MD   Requesting Service: Med Undesignated (MDX)  Reason for consultation: Comprehensive evaluation of  bronchiectasis exacerbation in the setting of chronic hypoxemic respiratory failure and PsA colonization .    Hospital Problems:  Principal Problem:    Acute exacerbation of bronchiectasis (CMS-HCC)  Active Problems:    Essential hypertension    Chronic respiratory failure with hypoxia (CMS-HCC)      HPI: Jordan [name] is a 67 y.o. male with bronchiectasis with chronic hypoxemic respiratory failure, Pseudomonas colonization, and prior history of lung resections.  Seen in clinic on 10/23/2022 when he reported more frequent spells of chest tightness, wheezing, and difficulty expectorating sputum.  Based on this, was scheduled for admission for IV antibiotics and aggressive airway clearance using alternative modalities to those that he uses at home.  No interval changes since clinic appointment aside [place].  He had a good time.    Problems addressed during this consult include bronchiectasis exacerbation in the setting of chronic hypoxemic respiratory failure and Pseudomonas colonization. Based on these problems, the patient has moderate risk of morbidity/mortality which is commensurate w their risk of management options described below in the recommendations.    Assessment      Interval Events:  No overnight events. Feels like he is coughing less though still clearing sputum. Overall improving.        Impression: The patient has made significant clinical progress since last encounter. I personally reviewed most recent pertinent labs, imaging and micro data, from 11/15/2022, which are noted in recommendations.         Recommendations   Antibiotics:  Based on prior culture data (OPF, PsA) and allergy profile, continue treating with Zosyn IV, Cipro 750 mg oral, and [name] nebs.  Single lumen PICC line placed for 2-3 weeks of IV antibiotics. Duration to be determined based on clinical response and improvement in inflammatory markers.  Continue chronic azithromycin for anti-inflammatory effect.     Other medications:  Trelegy converted to Duonebs (pre-treatment before hypertonic saline), budesonide 0.5 mg bid, and arformoterol 15 mcg bid.     Airway clearance: Continue aggressive airway clearance 4 times per day.  Nebulized hypertonic saline 10%; pre-treatment with albuterol/ipratropium.  For mechanical clearance, continue Metaneb and Vibralung.   Continue daily Pulmozyme.  Postural drainage     Labs:  Admission labs done. CBC-D notable for elevated absolute eosinophils to 0.5. CMP WNL.  CRP elevated to 12.0 --> 18.0 and ESR elevated to 52--> 51.  Sputum for bacterial and AFB cultures (NTM screening) in process. Lower respiratory culture preliminary with 2+ OPF and 1+ mPsA.  Weekly CRP and ESR to trend (including as outpatient). (Last 8/24, due ~8/31).  [facility] Guideline for Monitoring Outpatient Intravenous and Oral Antimicrobial Therapy recommended monitoring labs ([email or web address]    Consult:  If he continues to have right calf pain or it worsens, consider physical therapy consultation     Imaging:  CXR as noted below.     Other Testing:  Flow volume loop on Tuesday prior to discharge.  EKG after a few days of Cipro and azithromycin - done 11/10/22 and WNL.     Dispo:   Patient has a follow-up appointment with me scheduled for 01/26/23.  Anticipate discharge on Tuesday, August 27 after PFTs.    Please reach out to [facility]  [facility]ct[facility] team (myself, Alvino Blood RN, Eliberto Ivory CPP) prior to discharge to ensure that home infusion orders are correct and that we are comfortable with patient discharging home on IVs at that time.  We will follow monitoring labs.  Please use [email or web address] for home infusion orders. We will follow monitoring labs. Does not need OPAT.  Do not discharge Friday through Sunday or on a holiday as no outpatient clinical staff available to assist if problems arise with home health/home infusion.   I provided him a form for the pharmacy assistance program to assist with the co-pay cost of his Trelegy.    The recommendations outlined in this note were discussed w the primary team via epic chat. Please do not hesitate to page [phone] [facility] consult) with questions. We appreciate the opportunity to assist in the care of this patient. We look forward to following with you.    Attestation      I directly provided consultative services as documented in this note and I personally spent 35 minutes face-to-face and non face-to-face in the care of this patient, which includes all pre, intra and post visit time on the date of service including examining the patient, evaluating/interpreting objective clinical data, coordinating care with the entire multidisciplinary care team and developing a comprehensive management plan as outlined above. All documented time was specific to the E/M visit and does not include any procedures that may have been performed.      Guido Sander, MD    Subjective & Objective     Vitals - past 24 hours  Temp:  [36.1 ??C (97 ??F)-36.9 ??C (98.5 ??F)] 36.3 ??C (97.3 ??F)  Heart Rate:  [68-92] 68  Resp:  [18] 18  BP: (116-126)/(62-68) 126/62  SpO2:  [92 %-100 %] 92 % Intake/Output  I/O last 3 completed shifts:  In: 660 [P.O.:360; IV Piggyback:300]  Out: 475 [Urine:475]      Pertinent exam findings:   General appearance - well appearing, well nourished, and in no distress  Eyes - EOMI, PERRLA, anicteric sclerae, pink conjunctiva  Mouth - moist mucous membranes, no pharyngeal erythema or exudates  Neck - Trachea midline, normal neck movement  Lymphatics - not examined  Heart - regular rate and rhythm, normal S1/S2, no gallops, rubs, or murmurs  Chest -absent breath sounds over the right middle and lower lobe, corresponding with prior resection.  Reduced inspiratory and expiratory crackles over left lower lung field. Minimal crackles over left mid/upper lung fields.  Improved air entry over right upper lobe.  Easy work of breathing without accessory muscle use.  Speaking in full sentences without difficulty.  Abdomen - not examined  Extremities - Minimal swelling of posterior right calf with mild residual tenderness.  Clubbing of fingers.  No cyanosis.  Skin - normal coloration and turgor, no rashes, no suspicious skin lesions noted  Neurological - alert, oriented, normal speech, no focal findings or movement disorder noted    Relevant Imaging:  - CXR on 11/07/2022 revealed stable architectural distortion, volume loss, and bronchiectasis in the right upper lobe as well as stable extensive bronchiectasis and bronchial wall thickening of the left lung.    Arterial Blood Gas:   No results for input(s): SPECTYPEART, PHART, PCO2ART, PO2ART, HCO3ART, BEART, O2SATART in the last 24 hours.     Venous Blood Gas:   No results for input(s): PHVEN, PCO2VEN, PO2VEN, HCO3VEN, BEVEN, O2SATVEN in the last 24 hours.     Cultures:  Lower  Respiratory Culture (no units)   Date Value   11/07/2022 1+ Mucoid Pseudomonas aeruginosa (A)   11/07/2022 2+ Oropharyngeal Flora Isolated   11/07/2022 1+ Smooth Pseudomonas aeruginosa (A)     WBC (10*9/L)   Date Value   11/12/2022 6.7          Other Labs:  Lab Results   Component Value Date    WBC 6.7 11/12/2022    HGB 12.2 (L) 11/12/2022    HCT 36.2 (L) 11/12/2022    PLT 154 11/12/2022     Lab Results   Component Value Date    NA 141 11/12/2022    K 3.6 11/12/2022    CL 106 11/12/2022    CO2 31.7 (H) 11/12/2022    BUN 19 11/12/2022    CREATININE 1.08 11/12/2022    GLU 88 11/12/2022    CALCIUM 9.2 11/12/2022    MG 1.5 (L) 12/22/2021    PHOS 3.7 01/22/2016     Lab Results   Component Value Date    BILITOT 0.4 11/07/2022    BILIDIR 0.40 04/02/2015    PROT 6.8 11/07/2022    ALBUMIN 3.4 11/07/2022    ALT 12 11/07/2022    AST 13 11/07/2022    ALKPHOS 84 11/07/2022    GGT 64 06/21/2013     Lab Results   Component Value Date    LABPROT 10.4 08/03/2013    INR 0.95 04/14/2017    APTT 32.6 07/25/2014       Allergies & Home Medications   Personally reviewed in Epic    Continuous Infusions:       Scheduled Medications:    arformoterol  15 mcg Nebulization BID (RT)    ascorbic acid (vitamin C)  1,000 mg Oral Daily    atorvastatin  10 mg Oral Daily    azithromycin  250 mg Oral Daily    budesonide (PULMICORT) nebulizer solution  0.5 mg Nebulization BID (RT)    calcium carbonate  600 mg elem calcium Oral BID    ciprofloxacin HCl  750 mg Oral BID    dilTIAZem  120 mg Oral Daily    docusate sodium  100 mg Oral Nightly    dornase alfa  2.5 mg Inhalation Daily    fluticasone propionate  1 spray Each Nare Daily    HYDROcodone-acetaminophen  1.5 tablet Oral Q8H    ipratropium-albuterol  3 mL Nebulization 4x Daily (RT)    montelukast  10 mg Oral Nightly    multivitamins (ADULT)  1 tablet Oral Daily    pantoprazole  20 mg Oral BID    piperacillin-tazobactam (ZOSYN) IV (intermittent)  4.5 g Intravenous Q6H    sodium chloride  10 mL Intravenous Q8H    sodium chloride  5 mL Nebulization 4x Daily (RT)    tobramycin (PF)  300 mg Nebulization BID (RT)       PRN medications:  alum-mag-simeth, guaiFENesin, melatonin, senna

## 2022-11-15 NOTE — Unmapped (Signed)
[facility] Hospitalist Daily Progress Note     LOS: 8 days     Assessment/Plan:  Principal Problem:    Acute exacerbation of bronchiectasis (CMS-HCC)  Active Problems:    Essential hypertension    Chronic respiratory failure with hypoxia (CMS-HCC)         Acute exacerbation of bronchiectasis (CMS-HCC)   Acute on chronic hypoxemic respiratory failure  Follows with Dr. Garner Nash from Pulmonary Medicine for bronchiectasis. History of MDR Pseudomonas colonization. On 2L O2 via [place] at baseline. Presented with increasing dyspnea and production of purulent sputum over the last month. 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 Developed peripheral neuropathy possibly 2/2 voriconazole, that was stable/improved off treatment. Sputum culture growing both smooth and mucoid Pseudomonas.  Both are only sensitive to zosyn.  ESR down to 51, and CRP up  to 18 on 8/24.   - Pulmonology following  - Ciprofloxacin 750mg  po BID, Zosyn IV, Tobramycin nebs  - Continue home Azithromycin daily for anti-inflammatory affect  - Singulair qhs   - Hold home trelegy (hasn't been able to start this due to insurance/financial issues)  - Arformoterol BID  - Budesonide 0.5mg  BID  - Duonebs QID (pre-treatment for hypertonic saline)  - Airway clearance QID (RT)              - Hypertonic saline 10% nebs              - Mechanical clearance: Metaneb and Vibralung              - Daily Pulmozyme  - CRP and ESR weekly; continue on DC  - FU AFB culture  - Continuous O2 monitoring    Essential hypertension - Aortic Root Dilation   BP acceptable  - Diltiazem 120 qd  - Atorvastatin 10 qhs      Constipation  - Colace qhs,   - Monitor for BMs and intensify if needed     GERD  - Pantoprazole    - Norco q8h     DVT prophylaxis. Ambulatory      Disposition: Floor. DC to home 8/26     Please page the [facility]) pager at [phone] with questions.      Pending labs:   Pending Labs       Order Current Status    AFB culture Preliminary result            Subjective:   Notes more swelling in his left foot after injuring left calf a couple of days ago. No new respiratory issues.      Objective:   Physical Exam:    GEN: NAD, sitting up to chair.    RESPIRATORY: Breathing pattern was normal and the chest moved symmetrically.  Lung sounds were diminished on the right.    CARDIOVASCULAR: Heart rate and rhythm were normal.  S1 and S2 were normal and there were no extra sounds or murmurs.   ABDOMEN: The abdomen was normal in contour.  Bowel sounds were present.  Palpation detected no tenderness.   NEUROLOGIC: Mental status was normal.   The patient was normally coordinated in the arms  and legs.   PSYCHIATRIC: The patient was oriented to person, place, time, and circumstance. Speech was normal. Mood and affect were normal     Vital signs in last 24 hours:  Temp:  [36.1 ??C (97 ??F)-36.9 ??C (98.5 ??F)] 36.2 ??C (97.2 ??F)  Heart Rate:  [  76-92] 92  Resp:  [18] 18  BP: (116-123)/(63-68) 123/67  MAP (mmHg):  [82-89] 89  SpO2:  [94 %-100 %] 99 %    Intake/Output last 24 hours:  No intake or output data in the 24 hours ending 11/15/22 7628      Medications:   Scheduled Meds:   arformoterol  15 mcg Nebulization BID (RT)    ascorbic acid (vitamin C)  1,000 mg Oral Daily    atorvastatin  10 mg Oral Daily    azithromycin  250 mg Oral Daily    budesonide (PULMICORT) nebulizer solution  0.5 mg Nebulization BID (RT)    calcium carbonate  600 mg elem calcium Oral BID    ciprofloxacin HCl  750 mg Oral BID    dilTIAZem  120 mg Oral Daily    docusate sodium  100 mg Oral Nightly    dornase alfa  2.5 mg Inhalation Daily    fluticasone propionate  1 spray Each Nare Daily    HYDROcodone-acetaminophen  1.5 tablet Oral Q8H    ipratropium-albuterol  3 mL Nebulization 4x Daily (RT)    montelukast  10 mg Oral Nightly    multivitamins (ADULT)  1 tablet Oral Daily    pantoprazole  20 mg Oral BID    piperacillin-tazobactam (ZOSYN) IV (intermittent)  4.5 g Intravenous Q6H    sodium chloride  10 mL Intravenous Q8H    sodium chloride  5 mL Nebulization 4x Daily (RT)    tobramycin (PF)  300 mg Nebulization BID (RT)     Continuous Infusions:    D Linford Arnold, MD MD  [facility] Service.

## 2022-11-15 NOTE — Unmapped (Incomplete)
Pt admitted for acute exacerbation of bronchiectasis. Pain managed by scheduled Norco. PRN Robitussin given for cough. Voiding spontaneously. Ambulating independently. Tolerating PO intake. Tele monitoring continued. VSS. Fall and safety precautions in place. POC ongoing.       Problem: Adult Inpatient Plan of Care  Goal: Plan of Care Review  Outcome: Progressing  Goal: Patient-Specific Goal (Individualized)  Outcome: Progressing  Goal: Absence of Hospital-Acquired Illness or Injury  Outcome: Progressing  Intervention: Identify and Manage Fall Risk  Recent Flowsheet Documentation  Taken 11/15/2022 0212 by Almedia Balls, RN  Safety Interventions:   fall reduction program maintained   lighting adjusted for tasks/safety   low bed   nonskid shoes/slippers when out of bed   isolation precautions  Taken 11/15/2022 0000 by Jarryd Gratz, Quitman Livings, RN  Safety Interventions:   fall reduction program maintained   isolation precautions   low bed   lighting adjusted for tasks/safety   nonskid shoes/slippers when out of bed  Taken 11/14/2022 2210 by Khiry Pasquariello, [place]man Livings, RN  Safety Interventions:   fall reduction program maintained   lighting adjusted for tasks/safety   low bed   isolation precautions   nonskid shoes/slippers when out of bed  Taken 11/14/2022 2029 by Justinn Welter, Quitman Livings, RN  Safety Interventions:   fall reduction program maintained   lighting adjusted for tasks/safety   low bed   nonskid shoes/slippers when out of bed   isolation precautions  Intervention: Prevent Skin Injury  Recent Flowsheet Documentation  Taken 11/15/2022 0212 by Almedia Balls, RN  Positioning for Skin: Left  Taken 11/15/2022 0000 by Almedia Balls, RN  Positioning for Skin: Left  Taken 11/14/2022 2210 by Almedia Balls, RN  Positioning for Skin: Sitting in Chair  Taken 11/14/2022 2029 by Cortina Vultaggio, Q[place]man Livings, RN  Positioning for Skin: Sitting in Chair  Intervention: Prevent and Manage VTE (Venous Thromboembolism) Risk  Recent Flowsheet Documentation  Taken 11/15/2022 0212 by Shanah Guimaraes, [place]man Livings, RN  Anti-Embolism Intervention: (low risk) Other (Comment)  Taken 11/15/2022 0000 by Haneen Bernales, RN  Anti-Embolism Intervention: (low risk) Other (Comment)  Taken 11/14/2022 2210 by Kimberlye Dilger, RN  Anti-Embolism Intervention: (low risk) Other (Comment)  Taken 11/14/2022 2029 by Marvelene Stoneberg, RN  Anti-Embolism Intervention: (low risk) Other (Comment)  Goal: Optimal Comfort and Wellbeing  Outcome: Progressing  Goal: Readiness for Transition of Care  Outcome: Progressing  Goal: Rounds/Family Conference  Outcome: Progressing     Problem: Infection  Goal: Absence of Infection Signs and Symptoms  Outcome: Progressing  Intervention: Prevent or Manage Infection  Recent Flowsheet Documentation  Taken 11/15/2022 0212 by Jemina Scahill, Quitman Livings, RN  Isolation Precautions: contact precautions maintained  Taken 11/15/2022 0000 by Skipper Dacosta, Quitman Livings, RN  Isolation Precautions: contact precautions maintained  Taken 11/14/2022 2029 by Himani Corona, Quitman Livings, RN  Isolation Precautions: contact precautions maintained     Problem: Fall Injury Risk  Goal: Absence of Fall and Fall-Related Injury  Outcome: Progressing  Intervention: Promote Injury-Free Environment  Recent Flowsheet Documentation  Taken 11/15/2022 0212 by Almedia Balls, RN  Safety Interventions:   fall reduction program maintained   lighting adjusted for tasks/safety   low bed   nonskid shoes/slippers when out of bed   isolation precautions  Taken 11/15/2022 0000 by Dennard Vezina, Q[place]man Livings, RN  Safety Interventions:   fall reduction program maintained   isolation precautions   low bed   lighting adjusted for tasks/safety   nonskid shoes/slippers when out of bed  Taken 11/14/2022 2210 by Sonam Huelsmann, Quitman Livings, RN  Safety Interventions:   fall reduction program maintained  lighting adjusted for tasks/safety   low bed   isolation precautions   nonskid shoes/slippers when out of bed  Taken 11/14/2022 2029 by Kirstyn Lean, Quitman Livings, RN  Safety Interventions:   fall reduction program maintained lighting adjusted for tasks/safety   low bed   nonskid shoes/slippers when out of bed   isolation precautions     Problem: Pain Acute  Goal: Optimal Pain Control and Function  Outcome: Progressing     Problem: Skin Injury Risk Increased  Goal: Skin Health and Integrity  Outcome: Progressing  Intervention: Optimize Skin Protection  Recent Flowsheet Documentation  Taken 11/15/2022 0212 by Everrett Lacasse, [place]man Livings, RN  Pressure Reduction Techniques: frequent weight shift encouraged  Taken 11/15/2022 0000 by Labria Wos, RN  Pressure Reduction Techniques: frequent weight shift encouraged  Taken 11/14/2022 2210 by Brenya Taulbee, RN  Pressure Reduction Techniques: frequent weight shift encouraged  Taken 11/14/2022 2029 by Dailin Sosnowski, RN  Pressure Reduction Techniques: frequent weight shift encouraged     Problem: Gas Exchange Impaired  Goal: Optimal Gas Exchange  Outcome: Progressing     Problem: Artificial Airway  Goal: Effective Communication  Outcome: Progressing  Goal: Optimal Device Function  Outcome: Progressing  Goal: Absence of Device-Related Skin or Tissue Injury  Outcome: Progressing     Problem: Airway Clearance Ineffective  Goal: Effective Airway Clearance  Outcome: Progressing     Problem: Breathing Pattern Ineffective  Goal: Effective Breathing Pattern  Outcome: Progressing     Problem: Airway Clearance Ineffective  Goal: Effective Airway Clearance  Outcome: Progressing

## 2022-11-16 DIAGNOSIS — J479 Bronchiectasis, uncomplicated: Secondary | ICD-10-CM | POA: Diagnosis not present

## 2022-11-16 DIAGNOSIS — J471 Bronchiectasis with (acute) exacerbation: Secondary | ICD-10-CM | POA: Diagnosis not present

## 2022-11-16 DIAGNOSIS — G4733 Obstructive sleep apnea (adult) (pediatric): Secondary | ICD-10-CM | POA: Diagnosis not present

## 2022-11-16 DIAGNOSIS — B965 Pseudomonas (aeruginosa) (mallei) (pseudomallei) as the cause of diseases classified elsewhere: Secondary | ICD-10-CM | POA: Diagnosis not present

## 2022-11-16 DIAGNOSIS — J9611 Chronic respiratory failure with hypoxia: Secondary | ICD-10-CM | POA: Diagnosis not present

## 2022-11-16 DIAGNOSIS — J449 Chronic obstructive pulmonary disease, unspecified: Secondary | ICD-10-CM | POA: Diagnosis not present

## 2022-11-16 MED ADMIN — tobramycin (PF) (TOBI) 300 mg/5 mL nebulizer solution 300 mg: 300 mg | RESPIRATORY_TRACT | @ 12:00:00 | Stop: 2022-11-18

## 2022-11-16 MED ADMIN — ipratropium-albuterol (DUO-NEB) 0.5-2.5 mg/3 mL nebulizer solution 3 mL: 3 mL | RESPIRATORY_TRACT | @ 17:00:00

## 2022-11-16 MED ADMIN — budesonide (PULMICORT) nebulizer solution 0.5 mg: .5 mg | RESPIRATORY_TRACT | @ 12:00:00

## 2022-11-16 MED ADMIN — piperacillin-tazobactam (ZOSYN) IVPB (premix) 4.5 g: 4.5 g | INTRAVENOUS | @ 03:00:00 | Stop: 2022-11-18

## 2022-11-16 MED ADMIN — piperacillin-tazobactam (ZOSYN) IVPB (premix) 4.5 g: 4.5 g | INTRAVENOUS | @ 23:00:00 | Stop: 2022-11-18

## 2022-11-16 MED ADMIN — multivitamin with folic acid 400 mcg tablet 1 tablet: 1 | ORAL | @ 13:00:00

## 2022-11-16 MED ADMIN — dilTIAZem (CARDIZEM CD) 24 hr capsule 120 mg: 120 mg | ORAL | @ 13:00:00

## 2022-11-16 MED ADMIN — arformoterol (BROVANA) nebulizer solution 15 mcg/2 mL: 15 ug | RESPIRATORY_TRACT | @ 12:00:00

## 2022-11-16 MED ADMIN — ipratropium-albuterol (DUO-NEB) 0.5-2.5 mg/3 mL nebulizer solution 3 mL: 3 mL | RESPIRATORY_TRACT | @ 12:00:00

## 2022-11-16 MED ADMIN — atorvastatin (LIPITOR) tablet 10 mg: 10 mg | ORAL | @ 13:00:00

## 2022-11-16 MED ADMIN — calcium carbonate tablet 600 mg elem calcium: 600 mg | ORAL | @ 13:00:00

## 2022-11-16 MED ADMIN — ipratropium-albuterol (DUO-NEB) 0.5-2.5 mg/3 mL nebulizer solution 3 mL: 3 mL | RESPIRATORY_TRACT | @ 21:00:00

## 2022-11-16 MED ADMIN — ciprofloxacin HCl (CIPRO) tablet 750 mg: 750 mg | ORAL | @ 11:00:00 | Stop: 2022-11-18

## 2022-11-16 MED ADMIN — piperacillin-tazobactam (ZOSYN) IVPB (premix) 4.5 g: 4.5 g | INTRAVENOUS | @ 11:00:00 | Stop: 2022-11-18

## 2022-11-16 MED ADMIN — tobramycin (PF) (TOBI) 300 mg/5 mL nebulizer solution 300 mg: 300 mg | RESPIRATORY_TRACT | @ 01:00:00 | Stop: 2022-11-18

## 2022-11-16 MED ADMIN — arformoterol (BROVANA) nebulizer solution 15 mcg/2 mL: 15 ug | RESPIRATORY_TRACT | @ 01:00:00

## 2022-11-16 MED ADMIN — HYDROcodone-acetaminophen (NORCO) 5-325 mg per tablet 1.5 tablet: 1.5 | ORAL | @ 02:00:00 | Stop: 2022-11-21

## 2022-11-16 MED ADMIN — ipratropium-albuterol (DUO-NEB) 0.5-2.5 mg/3 mL nebulizer solution 3 mL: 3 mL | RESPIRATORY_TRACT

## 2022-11-16 MED ADMIN — ipratropium-albuterol (DUO-NEB) 0.5-2.5 mg/3 mL nebulizer solution 3 mL: 3 mL | RESPIRATORY_TRACT | @ 01:00:00

## 2022-11-16 MED ADMIN — montelukast (SINGULAIR) tablet 10 mg: 10 mg | ORAL | @ 02:00:00

## 2022-11-16 MED ADMIN — sodium chloride 10 % NEBULIZER solution 5 mL: 5 mL | RESPIRATORY_TRACT | @ 17:00:00

## 2022-11-16 MED ADMIN — guaiFENesin (ROBITUSSIN) oral syrup: 200 mg | ORAL | @ 03:00:00

## 2022-11-16 MED ADMIN — dornase alfa (PULMOZYME) 1 mg/mL solution 2.5 mg: 2.5 mg | RESPIRATORY_TRACT | @ 12:00:00

## 2022-11-16 MED ADMIN — calcium carbonate tablet 600 mg elem calcium: 600 mg | ORAL | @ 02:00:00

## 2022-11-16 MED ADMIN — sodium chloride 10 % NEBULIZER solution 5 mL: 5 mL | RESPIRATORY_TRACT | @ 12:00:00

## 2022-11-16 MED ADMIN — piperacillin-tazobactam (ZOSYN) IVPB (premix) 4.5 g: 4.5 g | INTRAVENOUS | @ 17:00:00 | Stop: 2022-11-18

## 2022-11-16 MED ADMIN — fluticasone propionate (FLONASE) 50 mcg/actuation nasal spray 1 spray: 1 | NASAL | @ 13:00:00

## 2022-11-16 MED ADMIN — azithromycin (ZITHROMAX) tablet 250 mg: 250 mg | ORAL | @ 13:00:00 | Stop: 2022-11-18

## 2022-11-16 MED ADMIN — pantoprazole (Protonix) EC tablet 20 mg: 20 mg | ORAL | @ 13:00:00

## 2022-11-16 MED ADMIN — pantoprazole (Protonix) EC tablet 20 mg: 20 mg | ORAL | @ 02:00:00

## 2022-11-16 MED ADMIN — budesonide (PULMICORT) nebulizer solution 0.5 mg: .5 mg | RESPIRATORY_TRACT | @ 01:00:00

## 2022-11-16 MED ADMIN — HYDROcodone-acetaminophen (NORCO) 5-325 mg per tablet 1.5 tablet: 1.5 | ORAL | @ 19:00:00 | Stop: 2022-11-21

## 2022-11-16 MED ADMIN — sodium chloride 10 % NEBULIZER solution 5 mL: 5 mL | RESPIRATORY_TRACT | @ 01:00:00

## 2022-11-16 MED ADMIN — ascorbic acid (vitamin C) (VITAMIN C) tablet 1,000 mg: 1000 mg | ORAL | @ 13:00:00

## 2022-11-16 MED ADMIN — sodium chloride 10 % NEBULIZER solution 5 mL: 5 mL | RESPIRATORY_TRACT | @ 21:00:00

## 2022-11-16 MED ADMIN — HYDROcodone-acetaminophen (NORCO) 5-325 mg per tablet 1.5 tablet: 1.5 | ORAL | @ 11:00:00 | Stop: 2022-11-21

## 2022-11-16 MED ADMIN — melatonin tablet 3 mg: 3 mg | ORAL | @ 02:00:00

## 2022-11-16 MED ADMIN — arformoterol (BROVANA) nebulizer solution 15 mcg/2 mL: 15 ug | RESPIRATORY_TRACT

## 2022-11-16 MED ADMIN — sodium chloride 10 % NEBULIZER solution 5 mL: 5 mL | RESPIRATORY_TRACT

## 2022-11-16 MED ADMIN — ciprofloxacin HCl (CIPRO) tablet 750 mg: 750 mg | ORAL | @ 23:00:00 | Stop: 2022-11-18

## 2022-11-16 NOTE — Unmapped (Signed)
No acute issues this shift, patient received breathing treatments as scheduled. VSS. Possible discharge tomorrow with home health. Will continue with POC   Problem: Adult Inpatient Plan of Care  Goal: Plan of Care Review  Outcome: Progressing  Flowsheets (Taken 11/16/2022 1628)  Plan of Care Reviewed With:   patient   spouse  Goal: Patient-Specific Goal (Individualized)  Outcome: Progressing  Goal: Absence of Hospital-Acquired Illness or Injury  Outcome: Progressing  Intervention: Prevent Skin Injury  Recent Flowsheet Documentation  Taken 11/16/2022 0915 by Lenetta Quaker, RN  Positioning for Skin: Supine/Back  Device Skin Pressure Protection: absorbent pad utilized/changed  Skin Protection: tubing/devices free from skin contact  Intervention: Prevent and Manage VTE (Venous Thromboembolism) Risk  Recent Flowsheet Documentation  Taken 11/16/2022 0915 by Lenetta Quaker, RN  Anti-Embolism Device Type: TED, Knee High  Anti-Embolism Intervention: Refused  Goal: Optimal Comfort and Wellbeing  Outcome: Progressing  Goal: Readiness for Transition of Care  Outcome: Progressing  Goal: Rounds/Family Conference  Outcome: Progressing     Problem: Infection  Goal: Absence of Infection Signs and Symptoms  Outcome: Progressing     Problem: Fall Injury Risk  Goal: Absence of Fall and Fall-Related Injury  Outcome: Progressing     Problem: Pain Acute  Goal: Optimal Pain Control and Function  Outcome: Progressing     Problem: Skin Injury Risk Increased  Goal: Skin Health and Integrity  Outcome: Progressing  Intervention: Optimize Skin Protection  Recent Flowsheet Documentation  Taken 11/16/2022 0915 by Lenetta Quaker, RN  Pressure Reduction Techniques: frequent weight shift encouraged  Pressure Reduction Devices: pressure-redistributing mattress utilized  Skin Protection: tubing/devices free from skin contact     Problem: Gas Exchange Impaired  Goal: Optimal Gas Exchange  Outcome: Progressing     Problem: Artificial Airway  Goal: Effective Communication  Outcome: Progressing  Goal: Optimal Device Function  Outcome: Progressing  Goal: Absence of Device-Related Skin or Tissue Injury  Outcome: Progressing  Intervention: Maintain Skin and Tissue Health  Recent Flowsheet Documentation  Taken 11/16/2022 0915 by Lenetta Quaker, RN  Device Skin Pressure Protection: absorbent pad utilized/changed     Problem: Airway Clearance Ineffective  Goal: Effective Airway Clearance  Outcome: Progressing  Intervention: Promote Airway Secretion Clearance  Recent Flowsheet Documentation  Taken 11/16/2022 0915 by Lenetta Quaker, RN  Breathing Techniques/Airway Clearance: (RT breathing treatments) deep/controlled cough encouraged     Problem: Breathing Pattern Ineffective  Goal: Effective Breathing Pattern  Outcome: Progressing  Intervention: Promote Improved Breathing Pattern  Recent Flowsheet Documentation  Taken 11/16/2022 0915 by Lenetta Quaker, RN  Breathing Techniques/Airway Clearance: (RT breathing treatments) deep/controlled cough encouraged     Problem: Airway Clearance Ineffective  Goal: Effective Airway Clearance  Outcome: Progressing  Intervention: Promote Airway Secretion Clearance  Recent Flowsheet Documentation  Taken 11/16/2022 0915 by Lenetta Quaker, RN  Breathing Techniques/Airway Clearance: (RT breathing treatments) deep/controlled cough encouraged

## 2022-11-16 NOTE — Unmapped (Signed)
[facility]ist Daily Progress Note     LOS: 9 days     Assessment/Plan:  Principal Problem:    Acute exacerbation of bronchiectasis (CMS-HCC)  Active Problems:    Essential hypertension    Chronic respiratory failure with hypoxia (CMS-HCC)         Acute exacerbation of bronchiectasis (CMS-HCC)   Acute on chronic hypoxemic respiratory failure  Follows with Dr. Garner Nash from Pulmonary Medicine for bronchiectasis. History of MDR Pseudomonas colonization. On 2L O2 via [place] at baseline. Presented with increasing dyspnea and production of purulent sputum over the last month. 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 Developed peripheral neuropathy possibly 2/2 voriconazole, that was stable/improved off treatment. Sputum culture growing both smooth and mucoid Pseudomonas.  Both are only sensitive to zosyn.  ESR down to 51, and CRP up  to 18 on 8/24. Pulmonary exam unchanged this morning compared to yesterday.    - Pulmonology following  - PFTs tomorrow prior to discharge.   - Ciprofloxacin 750mg  po BID, Zosyn IV, Tobramycin nebs  - Continue home Azithromycin daily for anti-inflammatory affect  - Singulair qhs   - Hold home trelegy (hasn't been able to start this due to insurance/financial issues)  - Arformoterol BID  - Budesonide 0.5mg  BID  - Duonebs QID (pre-treatment for hypertonic saline)  - Airway clearance QID (RT)              - Hypertonic saline 10% nebs              - Mechanical clearance: Metaneb and Vibralung              - Daily Pulmozyme  - CRP and ESR weekly; continue on DC  - FU AFB culture  - Continuous O2 monitoring    Essential hypertension - Aortic Root Dilation   BP acceptable  - Diltiazem 120 qd  - Atorvastatin 10 qhs      Constipation  - Colace qhs,   - Monitor for BMs and intensify if needed     GERD  - Pantoprazole    - Norco q8h     DVT prophylaxis. Ambulatory      Disposition: Floor. DC to home 8/26     Please page the [facility]) pager at [phone] with questions.      Pending labs:   Pending Labs       Order Current Status    AFB culture Preliminary result            Subjective:   Sitting up to chair on Vibralung.   Feeling well, looking forward to discharge tomorrow.     Objective:   Physical Exam:    GEN: NAD, sitting up to chair.    RESPIRATORY: Breathing pattern was normal and the chest moved symmetrically.  Lung sounds were diminished on the right. No wheezing or rhonchi.    CARDIOVASCULAR: Heart rate and rhythm were normal.  S1 and S2 were normal and there were no extra sounds or murmurs.   ABDOMEN: The abdomen was normal in contour.  Bowel sounds were present.  Palpation detected no tenderness.   NEUROLOGIC: Mental status was normal.   The patient was normally coordinated in the arms  and legs.   PSYCHIATRIC: The patient was oriented to person, place, time, and circumstance. Speech was normal. Mood and affect were normal     Vital signs in last 24 hours:  Temp:  [  36.3 ??C (97.3 ??F)-36.7 ??C (98 ??F)] 36.3 ??C (97.3 ??F)  Heart Rate:  [68-96] 96  Resp:  [18] 18  BP: (108-126)/(59-82) 108/69  MAP (mmHg):  [82-85] 83  SpO2:  [92 %-99 %] 99 %    Intake/Output last 24 hours:    Intake/Output Summary (Last 24 hours) at 11/16/2022 1610  Last data filed at 11/16/2022 0630  Gross per 24 hour   Intake 610 ml   Output 500 ml   Net 110 ml         Medications:   Scheduled Meds:   arformoterol  15 mcg Nebulization BID (RT)    ascorbic acid (vitamin C)  1,000 mg Oral Daily    atorvastatin  10 mg Oral Daily    azithromycin  250 mg Oral Daily    budesonide (PULMICORT) nebulizer solution  0.5 mg Nebulization BID (RT)    calcium carbonate  600 mg elem calcium Oral BID    ciprofloxacin HCl  750 mg Oral BID    dilTIAZem  120 mg Oral Daily    docusate sodium  100 mg Oral Nightly    dornase alfa  2.5 mg Inhalation Daily    fluticasone propionate  1 spray Each Nare Daily    HYDROcodone-acetaminophen  1.5 tablet Oral Q8H    ipratropium-albuterol 3 mL Nebulization 4x Daily (RT)    montelukast  10 mg Oral Nightly    multivitamins (ADULT)  1 tablet Oral Daily    pantoprazole  20 mg Oral BID    piperacillin-tazobactam (ZOSYN) IV (intermittent)  4.5 g Intravenous Q6H    sodium chloride  10 mL Intravenous Q8H    sodium chloride  5 mL Nebulization 4x Daily (RT)    tobramycin (PF)  300 mg Nebulization BID (RT)     Continuous Infusions:    D Linford Arnold, MD MD  [facility].

## 2022-11-16 NOTE — Unmapped (Signed)
General Pulmonary Team Follow Up Consult Note     Date of Service: 11/16/2022  Requesting Physician: Hubbard Robinson, MD   Requesting Service: Med Undesignated (MDX)  Reason for consultation: Comprehensive evaluation of  bronchiectasis exacerbation in the setting of chronic hypoxemic respiratory failure and PsA colonization .    Hospital Problems:  Principal Problem:    Acute exacerbation of bronchiectasis (CMS-HCC)  Active Problems:    Essential hypertension    Chronic respiratory failure with hypoxia (CMS-HCC)      HPI: Jordan [name] is a 67 y.o. male with bronchiectasis with chronic hypoxemic respiratory failure, Pseudomonas colonization, and prior history of lung resections.  Seen in clinic on 10/23/2022 when he reported more frequent spells of chest tightness, wheezing, and difficulty expectorating sputum.  Based on this, was scheduled for admission for IV antibiotics and aggressive airway clearance using alternative modalities to those that he uses at home.  No interval changes since clinic appointment aside from [place] and [place].  He had a good time.    Problems addressed during this consult include bronchiectasis exacerbation in the setting of chronic hypoxemic respiratory failure and Pseudomonas colonization. Based on these problems, the patient has moderate risk of morbidity/mortality which is commensurate w their risk of management options described below in the recommendations.    Assessment      Interval Events: No events, continues to feel improved.        Impression: The patient has made significant clinical progress since last encounter. I personally reviewed most recent pertinent labs, imaging and micro data, from 11/16/2022, which are noted in recommendations.         Recommendations   Antibiotics:  Based on prior culture data (OPF, PsA) and allergy profile, continue treating with Zosyn IV, Cipro 750 mg oral, and [name] nebs.  Single lumen PICC line placed for 2-3 weeks of IV antibiotics. Plan for PFTs tomorrow 8/27 to see if ok to discharge  Continue chronic azithromycin for anti-inflammatory effect.     Other medications:  Trelegy converted to Duonebs (pre-treatment before hypertonic saline), budesonide 0.5 mg bid, and arformoterol 15 mcg bid.     Airway clearance: Continue aggressive airway clearance 4 times per day.  Nebulized hypertonic saline 10%; pre-treatment with albuterol/ipratropium.  For mechanical clearance, continue Metaneb and Vibralung.   Continue daily Pulmozyme.  Postural drainage     Labs:  Admission labs done. CBC-D notable for elevated absolute eosinophils to 0.5. CMP WNL.  CRP elevated to 12.0 --> 18.0 and ESR elevated to 52--> 51.  Sputum for bacterial and AFB cultures (NTM screening) in process. Lower respiratory culture preliminary with 2+ OPF and 1+ mPsA.  Weekly CRP and ESR to trend (including as outpatient). (Last 8/24, due ~8/31).  [facility] Guideline for Monitoring Outpatient Intravenous and Oral Antimicrobial Therapy recommended monitoring labs ([email or web address]    Consult:  If he continues to have right calf pain or it worsens, consider physical therapy consultation     Imaging:  CXR as noted below.     Other Testing:  Flow volume loop on Tuesday prior to discharge.  EKG after a few days of Cipro and azithromycin - done 11/10/22 and WNL.     Dispo:   Patient has a follow-up appointment with Dr. Garner Nash scheduled for 01/26/23.  Anticipate discharge on Tuesday, August 27 after PFTs.    Please reach out to [facility] Bronchiectasis/NTM Care and [facility] team (myself, Alvino Blood RN, Lequita Halt  Beatriz Stallion CPP) prior to discharge to ensure that home infusion orders are correct and that we are comfortable with patient discharging home on IVs at that time.  We will follow monitoring labs.  Please use [email or web address] for home infusion orders. We will follow monitoring labs. Does not need OPAT.  Do not discharge Friday through Sunday or on a holiday as no outpatient clinical staff available to assist if problems arise with home health/home infusion.   I provided him a form for the pharmacy assistance program to assist with the co-pay cost of his Trelegy.    The recommendations outlined in this note were discussed w the primary team via epic chat. Please do not hesitate to page [phone] [facility] consult) with questions. We appreciate the opportunity to assist in the care of this patient. We look forward to following with you.    Attestation      I directly provided consultative services as documented in this note and I personally spent 35 minutes face-to-face and non face-to-face in the care of this patient, which includes all pre, intra and post visit time on the date of service including examining the patient, evaluating/interpreting objective clinical data, coordinating care with the entire multidisciplinary care team and developing a comprehensive management plan as outlined above. All documented time was specific to the E/M visit and does not include any procedures that may have been performed.      Pablo Ledger, MD    Subjective & Objective     Vitals - past 24 hours  Temp:  [36.3 ??C (97.3 ??F)-36.7 ??C (98 ??F)] 36.4 ??C (97.5 ??F)  Heart Rate:  [85-96] 87  Resp:  [18] 18  BP: (108-119)/(58-69) 117/58  SpO2:  [94 %-99 %] 94 % Intake/Output  I/O last 3 completed shifts:  In: 1560 [P.O.:860; IV Piggyback:700]  Out: 500 [Urine:500]      Pertinent exam findings:   General appearance - well appearing, well nourished, and in no distress  Heart - regular rate and rhythm, normal S1/S2, no gallops, rubs, or murmurs  Chest -absent breath sounds over the right middle and lower lobe, corresponding with prior resection.  Reduced inspiratory and expiratory crackles over left lower lung field. Minimal crackles over left mid/upper lung fields.  Improved air entry over right upper lobe.  Easy work of breathing without accessory muscle use.  Speaking in full sentences without difficulty.  Abdomen - not examined  Extremities - Minimal swelling of posterior right calf with mild residual tenderness.  Clubbing of fingers.  No cyanosis.  Skin - normal coloration and turgor, no rashes, no suspicious skin lesions noted  Neurological - alert, oriented, normal speech, no focal findings or movement disorder noted    Relevant Imaging:  - CXR on 11/07/2022 revealed stable architectural distortion, volume loss, and bronchiectasis in the right upper lobe as well as stable extensive bronchiectasis and bronchial wall thickening of the left lung.    Arterial Blood Gas:   No results for input(s): SPECTYPEART, PHART, PCO2ART, PO2ART, HCO3ART, BEART, O2SATART in the last 24 hours.     Venous Blood Gas:   No results for input(s): PHVEN, PCO2VEN, PO2VEN, HCO3VEN, BEVEN, O2SATVEN in the last 24 hours.     Cultures:  Lower Respiratory Culture (no units)   Date Value   11/07/2022 1+ Mucoid Pseudomonas aeruginosa (A)   11/07/2022 2+ Oropharyngeal Flora Isolated   11/07/2022 1+ Smooth Pseudomonas aeruginosa (A)     WBC (10*9/L)   Date Value   11/12/2022  6.7          Other Labs:  Lab Results   Component Value Date    WBC 6.7 11/12/2022    HGB 12.2 (L) 11/12/2022    HCT 36.2 (L) 11/12/2022    PLT 154 11/12/2022     Lab Results   Component Value Date    NA 141 11/12/2022    K 3.6 11/12/2022    CL 106 11/12/2022    CO2 31.7 (H) 11/12/2022    BUN 19 11/12/2022    CREATININE 1.08 11/12/2022    GLU 88 11/12/2022    CALCIUM 9.2 11/12/2022    MG 1.5 (L) 12/22/2021    PHOS 3.7 01/22/2016     Lab Results   Component Value Date    BILITOT 0.4 11/07/2022    BILIDIR 0.40 04/02/2015    PROT 6.8 11/07/2022    ALBUMIN 3.4 11/07/2022    ALT 12 11/07/2022    AST 13 11/07/2022    ALKPHOS 84 11/07/2022    GGT 64 06/21/2013     Lab Results   Component Value Date    LABPROT 10.4 08/03/2013    INR 0.95 04/14/2017    APTT 32.6 07/25/2014       Allergies & Home Medications   Personally reviewed in Epic    Continuous Infusions:       Scheduled Medications:    arformoterol  15 mcg Nebulization BID (RT)    ascorbic acid (vitamin C)  1,000 mg Oral Daily    atorvastatin  10 mg Oral Daily    azithromycin  250 mg Oral Daily    budesonide (PULMICORT) nebulizer solution  0.5 mg Nebulization BID (RT)    calcium carbonate  600 mg elem calcium Oral BID    ciprofloxacin HCl  750 mg Oral BID    dilTIAZem  120 mg Oral Daily    docusate sodium  100 mg Oral Nightly    dornase alfa  2.5 mg Inhalation Daily    fluticasone propionate  1 spray Each Nare Daily    HYDROcodone-acetaminophen  1.5 tablet Oral Q8H    ipratropium-albuterol  3 mL Nebulization 4x Daily (RT)    montelukast  10 mg Oral Nightly    multivitamins (ADULT)  1 tablet Oral Daily    pantoprazole  20 mg Oral BID    piperacillin-tazobactam (ZOSYN) IV (intermittent)  4.5 g Intravenous Q6H    sodium chloride  10 mL Intravenous Q8H    sodium chloride  5 mL Nebulization 4x Daily (RT)    tobramycin (PF)  300 mg Nebulization BID (RT)       PRN medications:  alum-mag-simeth, guaiFENesin, melatonin, senna

## 2022-11-16 NOTE — Unmapped (Signed)
Pt A&Ox4. VSS on 2L [place]. PRN given for cough. Pt admitted 8/17 for acute exacerbation of bronchiectasis. Universal safety and falls precautions in place. PICC in place. Regular diet in place. Voiding. BM: 8/25. No outstanding needs.     Problem: Adult Inpatient Plan of Care  Goal: Plan of Care Review  Outcome: Ongoing - Unchanged  Goal: Patient-Specific Goal (Individualized)  Outcome: Ongoing - Unchanged  Goal: Absence of Hospital-Acquired Illness or Injury  Outcome: Ongoing - Unchanged  Intervention: Identify and Manage Fall Risk  Recent Flowsheet Documentation  Taken 11/15/2022 2000 by Lenora Boys, RN  Safety Interventions:   lighting adjusted for tasks/safety   low bed  Intervention: Prevent and Manage VTE (Venous Thromboembolism) Risk  Recent Flowsheet Documentation  Taken 11/16/2022 0000 by Lenora Boys, RN  Anti-Embolism Device Type: SCD, Knee  Anti-Embolism Intervention: Refused  Anti-Embolism Device Location: BLE  Taken 11/15/2022 2200 by Lenora Boys, RN  Anti-Embolism Device Type: SCD, Knee  Anti-Embolism Intervention: Refused  Anti-Embolism Device Location: BLE  Taken 11/15/2022 2000 by Lenora Boys, RN  Anti-Embolism Device Type: SCD, Knee  Anti-Embolism Intervention: Refused  Anti-Embolism Device Location: BLE  Goal: Optimal Comfort and Wellbeing  Outcome: Ongoing - Unchanged  Goal: Readiness for Transition of Care  Outcome: Ongoing - Unchanged  Goal: Rounds/Family Conference  Outcome: Ongoing - Unchanged     Problem: Infection  Goal: Absence of Infection Signs and Symptoms  Outcome: Ongoing - Unchanged     Problem: Fall Injury Risk  Goal: Absence of Fall and Fall-Related Injury  Outcome: Ongoing - Unchanged  Intervention: Promote Injury-Free Environment  Recent Flowsheet Documentation  Taken 11/15/2022 2000 by Lenora Boys, RN  Safety Interventions:   lighting adjusted for tasks/safety   low bed     Problem: Pain Acute  Goal: Optimal Pain Control and Function  Outcome: Ongoing - Unchanged     Problem: Skin Injury Risk Increased  Goal: Skin Health and Integrity  Outcome: Ongoing - Unchanged     Problem: Gas Exchange Impaired  Goal: Optimal Gas Exchange  Outcome: Ongoing - Unchanged     Problem: Artificial Airway  Goal: Effective Communication  Outcome: Ongoing - Unchanged  Goal: Optimal Device Function  Outcome: Ongoing - Unchanged  Goal: Absence of Device-Related Skin or Tissue Injury  Outcome: Ongoing - Unchanged     Problem: Airway Clearance Ineffective  Goal: Effective Airway Clearance  Outcome: Ongoing - Unchanged     Problem: Airway Clearance Ineffective  Goal: Effective Airway Clearance  Outcome: Ongoing - Unchanged     Problem: Breathing Pattern Ineffective  Goal: Effective Breathing Pattern  Outcome: Ongoing - Unchanged

## 2022-11-16 NOTE — Unmapped (Signed)
mild pain reported, managed by [facility]. Adequate UOP, pt voiding, 3 BM. Tolerating regular diet.  Fall and safety precautions maintained, plan of care ongoing.    Problem: Adult Inpatient Plan of Care  Goal: Plan of Care Review  Outcome: Progressing  Goal: Patient-Specific Goal (Individualized)  Outcome: Progressing  Goal: Absence of Hospital-Acquired Illness or Injury  Outcome: Progressing  Intervention: Identify and Manage Fall Risk  Recent Flowsheet Documentation  Taken 11/15/2022 1600 by Gretta Began, RN  Safety Interventions: fall reduction program maintained  Taken 11/15/2022 1415 by Gretta Began, RN  Safety Interventions: fall reduction program maintained  Taken 11/15/2022 1200 by Gretta Began, RN  Safety Interventions: fall reduction program maintained  Taken 11/15/2022 1000 by Manson Passey, Bonni Neuser M, RN  Safety Interventions: fall reduction program maintained  Taken 11/15/2022 0800 by Manson Passey, Ura Hausen M, RN  Safety Interventions: fall reduction program maintained  Intervention: Prevent Skin Injury  Recent Flowsheet Documentation  Taken 11/15/2022 1600 by Gretta Began, RN  Positioning for Skin: Sitting in Chair  Taken 11/15/2022 1415 by Gretta Began, RN  Positioning for Skin: Sitting in Chair  Taken 11/15/2022 1200 by Manson Passey, Shahara Hartsfield M, RN  Positioning for Skin: Sitting in Chair  Taken 11/15/2022 1000 by Manson Passey, Shenelle Klas M, RN  Positioning for Skin: Sitting in Chair  Taken 11/15/2022 0800 by Manson Passey, Vere Diantonio M, RN  Positioning for Skin: Sitting in Chair  Goal: Optimal Comfort and Wellbeing  Outcome: Progressing  Goal: Readiness for Transition of Care  Outcome: Progressing  Goal: Rounds/Family Conference  Outcome: Progressing     Problem: Infection  Goal: Absence of Infection Signs and Symptoms  Outcome: Progressing  Intervention: Prevent or Manage Infection  Recent Flowsheet Documentation  Taken 11/15/2022 1200 by Gretta Began, RN  Isolation Precautions: contact precautions maintained  Taken 11/15/2022 1000 by Gretta Began, RN  Isolation Precautions: contact precautions maintained  Taken 11/15/2022 0800 by Gretta Began, RN  Isolation Precautions: contact precautions maintained     Problem: Fall Injury Risk  Goal: Absence of Fall and Fall-Related Injury  Outcome: Progressing  Intervention: Promote Scientist, clinical (histocompatibility and immunogenetics) Documentation  Taken 11/15/2022 1600 by Manson Passey, Markell Schrier M, RN  Safety Interventions: fall reduction program maintained  Taken 11/15/2022 1415 by Gretta Began, RN  Safety Interventions: fall reduction program maintained  Taken 11/15/2022 1200 by Manson Passey, Iyania Denne M, RN  Safety Interventions: fall reduction program maintained  Taken 11/15/2022 1000 by Manson Passey, Jaidence Geisler M, RN  Safety Interventions: fall reduction program maintained  Taken 11/15/2022 0800 by Manson Passey, Bostyn Bogie M, RN  Safety Interventions: fall reduction program maintained     Problem: Pain Acute  Goal: Optimal Pain Control and Function  Outcome: Progressing     Problem: Skin Injury Risk Increased  Goal: Skin Health and Integrity  Outcome: Progressing  Intervention: Optimize Skin Protection  Recent Flowsheet Documentation  Taken 11/15/2022 1600 by Gretta Began, RN  Pressure Reduction Techniques: frequent weight shift encouraged  Taken 11/15/2022 1415 by Gretta Began, RN  Pressure Reduction Techniques: frequent weight shift encouraged  Taken 11/15/2022 1200 by Manson Passey, Brixton Franko M, RN  Pressure Reduction Techniques: frequent weight shift encouraged  Taken 11/15/2022 1000 by Manson Passey, Rudi Bunyard M, RN  Pressure Reduction Techniques: frequent weight shift encouraged  Taken 11/15/2022 0800 by Manson Passey, Jamilyn Pigeon M, RN  Pressure Reduction Techniques: frequent weight shift encouraged     Problem: Gas Exchange Impaired  Goal: Optimal Gas Exchange  Outcome: Progressing     Problem: Artificial Airway  Goal: Effective Communication  Outcome: Progressing  Goal: Optimal Device Function  Outcome: Progressing  Goal: Absence of Device-Related Skin or Tissue Injury  Outcome: Progressing Problem: Airway Clearance Ineffective  Goal: Effective Airway Clearance  Outcome: Progressing     Problem: Breathing Pattern Ineffective  Goal: Effective Breathing Pattern  Outcome: Progressing     Problem: Airway Clearance Ineffective  Goal: Effective Airway Clearance  Outcome: Progressing

## 2022-11-17 DIAGNOSIS — J9611 Chronic respiratory failure with hypoxia: Secondary | ICD-10-CM | POA: Diagnosis not present

## 2022-11-17 DIAGNOSIS — I10 Essential (primary) hypertension: Secondary | ICD-10-CM | POA: Diagnosis not present

## 2022-11-17 DIAGNOSIS — K5900 Constipation, unspecified: Secondary | ICD-10-CM | POA: Diagnosis not present

## 2022-11-17 DIAGNOSIS — J471 Bronchiectasis with (acute) exacerbation: Secondary | ICD-10-CM | POA: Diagnosis not present

## 2022-11-17 DIAGNOSIS — J9622 Acute and chronic respiratory failure with hypercapnia: Secondary | ICD-10-CM | POA: Diagnosis not present

## 2022-11-17 DIAGNOSIS — J9621 Acute and chronic respiratory failure with hypoxia: Secondary | ICD-10-CM | POA: Diagnosis not present

## 2022-11-17 DIAGNOSIS — B965 Pseudomonas (aeruginosa) (mallei) (pseudomallei) as the cause of diseases classified elsewhere: Secondary | ICD-10-CM | POA: Diagnosis not present

## 2022-11-17 DIAGNOSIS — J479 Bronchiectasis, uncomplicated: Secondary | ICD-10-CM | POA: Diagnosis not present

## 2022-11-17 MED ORDER — TRELEGY ELLIPTA 200 MCG-62.5 MCG-25 MCG POWDER FOR INHALATION
Freq: Every day | RESPIRATORY_TRACT | 3 refills | 0 days | Status: CN
Start: 2022-11-17 — End: ?

## 2022-11-17 MED ORDER — CIPROFLOXACIN 750 MG TABLET
ORAL_TABLET | Freq: Two times a day (BID) | ORAL | 0 refills | 14 days | Status: CP
Start: 2022-11-17 — End: 2022-12-01
  Filled 2022-11-17: qty 28, 14d supply, fill #0

## 2022-11-17 MED ORDER — FLUTICASONE FUROATE 100 MCG-VILANTEROL 25 MCG/DOSE INHALATION POWDER
Freq: Every day | RESPIRATORY_TRACT | 12 refills | 30 days | Status: CP
Start: 2022-11-17 — End: ?

## 2022-11-17 MED ORDER — BUDESONIDE-FORMOTEROL HFA 160 MCG-4.5 MCG/ACTUATION AEROSOL INHALER
Freq: Two times a day (BID) | RESPIRATORY_TRACT | 0 refills | 31 days | Status: CP
Start: 2022-11-17 — End: 2022-11-17

## 2022-11-17 MED ADMIN — calcium carbonate tablet 600 mg elem calcium: 600 mg | ORAL | @ 13:00:00 | Stop: 2022-11-17

## 2022-11-17 MED ADMIN — budesonide (PULMICORT) nebulizer solution 0.5 mg: .5 mg | RESPIRATORY_TRACT

## 2022-11-17 MED ADMIN — piperacillin-tazobactam (ZOSYN) IVPB (premix) 4.5 g: 4.5 g | INTRAVENOUS | @ 17:00:00 | Stop: 2022-11-17

## 2022-11-17 MED ADMIN — atorvastatin (LIPITOR) tablet 10 mg: 10 mg | ORAL | @ 13:00:00 | Stop: 2022-11-17

## 2022-11-17 MED ADMIN — dornase alfa (PULMOZYME) 1 mg/mL solution 2.5 mg: 2.5 mg | RESPIRATORY_TRACT | @ 12:00:00 | Stop: 2022-11-17

## 2022-11-17 MED ADMIN — tobramycin (PF) (TOBI) 300 mg/5 mL nebulizer solution 300 mg: 300 mg | RESPIRATORY_TRACT | Stop: 2022-11-18

## 2022-11-17 MED ADMIN — sodium chloride (NS) 0.9 % flush 10 mL: 10 mL | INTRAVENOUS | @ 07:00:00 | Stop: 2022-11-17

## 2022-11-17 MED ADMIN — ascorbic acid (vitamin C) (VITAMIN C) tablet 1,000 mg: 1000 mg | ORAL | @ 13:00:00 | Stop: 2022-11-17

## 2022-11-17 MED ADMIN — ciprofloxacin HCl (CIPRO) tablet 750 mg: 750 mg | ORAL | @ 10:00:00 | Stop: 2022-11-17

## 2022-11-17 MED ADMIN — ipratropium-albuterol (DUO-NEB) 0.5-2.5 mg/3 mL nebulizer solution 3 mL: 3 mL | RESPIRATORY_TRACT | @ 12:00:00 | Stop: 2022-11-17

## 2022-11-17 MED ADMIN — piperacillin-tazobactam (ZOSYN) IVPB (premix) 4.5 g: 4.5 g | INTRAVENOUS | @ 10:00:00 | Stop: 2022-11-17

## 2022-11-17 MED ADMIN — pantoprazole (Protonix) EC tablet 20 mg: 20 mg | ORAL | @ 13:00:00 | Stop: 2022-11-17

## 2022-11-17 MED ADMIN — piperacillin-tazobactam (ZOSYN) IVPB (premix) 4.5 g: 4.5 g | INTRAVENOUS | @ 04:00:00 | Stop: 2022-11-17

## 2022-11-17 MED ADMIN — fluticasone propionate (FLONASE) 50 mcg/actuation nasal spray 1 spray: 1 | NASAL | @ 13:00:00 | Stop: 2022-11-17

## 2022-11-17 MED ADMIN — montelukast (SINGULAIR) tablet 10 mg: 10 mg | ORAL | @ 01:00:00

## 2022-11-17 MED ADMIN — dilTIAZem (CARDIZEM CD) 24 hr capsule 120 mg: 120 mg | ORAL | @ 13:00:00 | Stop: 2022-11-17

## 2022-11-17 MED ADMIN — pantoprazole (Protonix) EC tablet 20 mg: 20 mg | ORAL | @ 01:00:00

## 2022-11-17 MED ADMIN — multivitamin with folic acid 400 mcg tablet 1 tablet: 1 | ORAL | @ 13:00:00 | Stop: 2022-11-17

## 2022-11-17 MED ADMIN — HYDROcodone-acetaminophen (NORCO) 5-325 mg per tablet 1.5 tablet: 1.5 | ORAL | @ 10:00:00 | Stop: 2022-11-17

## 2022-11-17 MED ADMIN — budesonide (PULMICORT) nebulizer solution 0.5 mg: .5 mg | RESPIRATORY_TRACT | @ 12:00:00 | Stop: 2022-11-17

## 2022-11-17 MED ADMIN — HYDROcodone-acetaminophen (NORCO) 5-325 mg per tablet 1.5 tablet: 1.5 | ORAL | @ 01:00:00 | Stop: 2022-11-21

## 2022-11-17 MED ADMIN — calcium carbonate tablet 600 mg elem calcium: 600 mg | ORAL | @ 01:00:00

## 2022-11-17 MED ADMIN — sodium chloride 10 % NEBULIZER solution 5 mL: 5 mL | RESPIRATORY_TRACT | @ 12:00:00 | Stop: 2022-11-17

## 2022-11-17 MED ADMIN — arformoterol (BROVANA) nebulizer solution 15 mcg/2 mL: 15 ug | RESPIRATORY_TRACT | @ 12:00:00 | Stop: 2022-11-17

## 2022-11-17 MED ADMIN — tobramycin (PF) (TOBI) 300 mg/5 mL nebulizer solution 300 mg: 300 mg | RESPIRATORY_TRACT | @ 12:00:00 | Stop: 2022-11-17

## 2022-11-17 MED ADMIN — azithromycin (ZITHROMAX) tablet 250 mg: 250 mg | ORAL | @ 13:00:00 | Stop: 2022-11-17

## 2022-11-17 MED FILL — BD REGULAR BEVEL NEEDLES 21 GAUGE X 1 1/2": 30 days supply | Qty: 60 | Fill #4

## 2022-11-17 MED FILL — BD LUER-LOK SYRINGE 3 ML 21 GAUGE X 1": 30 days supply | Qty: 60 | Fill #1

## 2022-11-17 MED FILL — ALCOHOL PREP PADS: 50 days supply | Qty: 100 | Fill #1

## 2022-11-17 MED FILL — COLISTIN (COLISTIMETHATE SODIUM) 150 MG SOLUTION FOR INJECTION: 30 days supply | Qty: 60 | Fill #2

## 2022-11-17 MED FILL — BD POSIFLUSH NORMAL SALINE 0.9 % INJECTION SYRINGE: 30 days supply | Qty: 180 | Fill #3

## 2022-11-17 MED FILL — BREO ELLIPTA 100 MCG-25 MCG/DOSE POWDER FOR INHALATION: RESPIRATORY_TRACT | 30 days supply | Qty: 60 | Fill #0

## 2022-11-17 NOTE — Unmapped (Signed)
General Pulmonary Team Follow Up Consult Note     Date of Service: 11/17/2022  Requesting Physician: Docia Furl, MD   Requesting Service: Med Undesignated (MDX)  Reason for consultation: Comprehensive evaluation of  bronchiectasis exacerbation in the setting of chronic hypoxemic respiratory failure and PsA colonization .    Hospital Problems:  Principal Problem:    Acute exacerbation of bronchiectasis (CMS-HCC)  Active Problems:    Essential hypertension    Chronic respiratory failure with hypoxia (CMS-HCC)      HPI: Jordan [name] is a 67 y.o. male with bronchiectasis with chronic hypoxemic respiratory failure, Pseudomonas colonization, and prior history of lung resections.  Seen in clinic on 10/23/2022 when he reported more frequent spells of chest tightness, wheezing, and difficulty expectorating sputum.  Based on this, was scheduled for admission for IV antibiotics and aggressive airway clearance using alternative modalities to those that he uses at home.  No interval changes since clinic appointment aside from [place].  He had a good time.    Problems addressed during this consult include bronchiectasis exacerbation in the setting of chronic hypoxemic respiratory failure and Pseudomonas colonization. Based on these problems, the patient has moderate risk of morbidity/mortality which is commensurate w their risk of management options described below in the recommendations.    Assessment      Interval Events: No events, continues to feel improved. PFTs today with some improvement, plan for discharge home to complete IV antibiotics with zosyn.       Impression: The patient has made significant clinical progress since last encounter. I personally reviewed most recent pertinent labs, imaging and micro data, from 11/17/2022, which are noted in recommendations.         Recommendations   Antibiotics:  Based on prior culture data (OPF, PsA) and allergy profile, continue treating with Zosyn IV, Cipro 750 mg oral, and [name] nebs.  Single lumen PICC line placed for 2-3 weeks of IV antibiotics.   Continue chronic azithromycin for anti-inflammatory effect.     Other medications:  Trelegy converted to Duonebs (pre-treatment before hypertonic saline), budesonide 0.5 mg bid, and arformoterol 15 mcg bid. He is unable to get Trelegy for financial reasons so discharged back on Symbicort and Spiriva (on previously).     Airway clearance: Continue aggressive airway clearance 4 times per day.  Nebulized hypertonic saline 10%; pre-treatment with albuterol/ipratropium.  For mechanical clearance, continue Metaneb and Vibralung.   Continue daily Pulmozyme.  Postural drainage     Labs:  Admission labs done. CBC-D notable for elevated absolute eosinophils to 0.5. CMP WNL.  CRP elevated to 12.0 --> 18.0 and ESR elevated to 52--> 51.  Sputum for bacterial and AFB cultures (NTM screening) in process. Lower respiratory culture preliminary with 2+ OPF and 1+ mPsA.  Weekly CRP and ESR to trend (including as outpatient). (Last 8/24, due ~8/31).  [facility] Guideline for Monitoring Outpatient Intravenous and Oral Antimicrobial Therapy recommended monitoring labs ([email or web address])       Dispo:   Patient has a follow-up appointment with Dr. Garner Nash scheduled for 01/26/23.  Discharge on Tuesday, August 27   Please reach out to [facility] Bronchiectasis/NTM Care and [facility] team (myself, Alvino Blood RN, Eliberto Ivory CPP) prior to discharge to ensure that home infusion orders are correct and that we are comfortable with patient discharging home on IVs at that time.  We will follow monitoring labs.  Please use DOTPULMIVDISCHARGE for home  infusion orders. We will follow monitoring labs. Does not need OPAT.  Do not discharge Friday through Sunday or on a holiday as no outpatient clinical staff available to assist if problems arise with home health/home infusion.   I provided him a form for the pharmacy assistance program to assist with the co-pay cost of his Trelegy.    The recommendations outlined in this note were discussed w the primary team direct (in-person). Please do not hesitate to page [phone] [facility] consult) with questions. We appreciate the opportunity to assist in the care of this patient. We look forward to following with you.    Attestation      I directly provided consultative services as documented in this note and I personally spent 35 minutes face-to-face and non face-to-face in the care of this patient, which includes all pre, intra and post visit time on the date of service including examining the patient, evaluating/interpreting objective clinical data, coordinating care with the entire multidisciplinary care team and developing a comprehensive management plan as outlined above. All documented time was specific to the E/M visit and does not include any procedures that may have been performed.      Pablo Ledger, MD    Subjective & Objective     Vitals - past 24 hours  Temp:  [36.3 ??C (97.4 ??F)-36.6 ??C (97.9 ??F)] 36.3 ??C (97.4 ??F)  Heart Rate:  [79-101] 79  Resp:  [16-20] 20  BP: (115-141)/(60-72) 141/72  SpO2:  [92 %-98 %] 98 % Intake/Output  I/O last 3 completed shifts:  In: 2255 [P.O.:1555; IV Piggyback:700]  Out: 500 [Urine:500]      Pertinent exam findings:   General appearance - well appearing, well nourished, and in no distress  Heart - regular rate and rhythm, normal S1/S2, no gallops, rubs, or murmurs  Chest -absent breath sounds over the right middle and lower lobe, corresponding with prior resection.  Few crackles at left base but clear in right and left upper  Abdomen - not examined  Extremities - Clubbing of fingers.  No cyanosis.  Skin - normal coloration and turgor, no rashes, no suspicious skin lesions noted  Neurological - alert, oriented, normal speech, no focal findings or movement disorder noted    Relevant Imaging:  - CXR on 11/07/2022 revealed stable architectural distortion, volume loss, and bronchiectasis in the right upper lobe as well as stable extensive bronchiectasis and bronchial wall thickening of the left lung.    Arterial Blood Gas:   No results for input(s): SPECTYPEART, PHART, PCO2ART, PO2ART, HCO3ART, BEART, O2SATART in the last 24 hours.     Venous Blood Gas:   No results for input(s): PHVEN, PCO2VEN, PO2VEN, HCO3VEN, BEVEN, O2SATVEN in the last 24 hours.     Cultures:  Lower Respiratory Culture (no units)   Date Value   11/07/2022 1+ Mucoid Pseudomonas aeruginosa (A)   11/07/2022 2+ Oropharyngeal Flora Isolated   11/07/2022 1+ Smooth Pseudomonas aeruginosa (A)     WBC (10*9/L)   Date Value   11/12/2022 6.7          Other Labs:  Lab Results   Component Value Date    WBC 6.7 11/12/2022    HGB 12.2 (L) 11/12/2022    HCT 36.2 (L) 11/12/2022    PLT 154 11/12/2022     Lab Results   Component Value Date    NA 141 11/12/2022    K 3.6 11/12/2022    CL 106 11/12/2022    CO2 31.7 (H)  11/12/2022    BUN 19 11/12/2022    CREATININE 1.08 11/12/2022    GLU 88 11/12/2022    CALCIUM 9.2 11/12/2022    MG 1.5 (L) 12/22/2021    PHOS 3.7 01/22/2016     Lab Results   Component Value Date    BILITOT 0.4 11/07/2022    BILIDIR 0.40 04/02/2015    PROT 6.8 11/07/2022    ALBUMIN 3.4 11/07/2022    ALT 12 11/07/2022    AST 13 11/07/2022    ALKPHOS 84 11/07/2022    GGT 64 06/21/2013     Lab Results   Component Value Date    LABPROT 10.4 08/03/2013    INR 0.95 04/14/2017    APTT 32.6 07/25/2014       Allergies & Home Medications   Personally reviewed in Epic    Continuous Infusions:       Scheduled Medications:    arformoterol  15 mcg Nebulization BID (RT)    ascorbic acid (vitamin C)  1,000 mg Oral Daily    atorvastatin  10 mg Oral Daily azithromycin  250 mg Oral Daily    budesonide (PULMICORT) nebulizer solution  0.5 mg Nebulization BID (RT)    calcium carbonate  600 mg elem calcium Oral BID    ciprofloxacin HCl  750 mg Oral BID    dilTIAZem  120 mg Oral Daily    docusate sodium  100 mg Oral Nightly    dornase alfa  2.5 mg Inhalation Daily    fluticasone propionate  1 spray Each Nare Daily    HYDROcodone-acetaminophen  1.5 tablet Oral Q8H    ipratropium-albuterol  3 mL Nebulization 4x Daily (RT)    montelukast  10 mg Oral Nightly    multivitamins (ADULT)  1 tablet Oral Daily    pantoprazole  20 mg Oral BID    piperacillin-tazobactam (ZOSYN) IV (intermittent)  4.5 g Intravenous Q6H    sodium chloride  10 mL Intravenous Q8H    sodium chloride  5 mL Nebulization 4x Daily (RT)    tobramycin (PF)  300 mg Nebulization BID (RT)       PRN medications:  alum-mag-simeth, guaiFENesin, melatonin, senna

## 2022-11-17 NOTE — Unmapped (Signed)
Pt is AX4 on 2 L [place]. Ambulates independently. Contact precautions maintained. Back pain managed with scheduled NORCO. PICC dressing and IV tubing changed. Pt is tolerating reg diet. Safety measures in place. Will continue to monitor.   Problem: Adult Inpatient Plan of Care  Goal: Plan of Care Review  Outcome: Progressing  Goal: Patient-Specific Goal (Individualized)  Outcome: Progressing  Goal: Absence of Hospital-Acquired Illness or Injury  Outcome: Progressing  Intervention: Identify and Manage Fall Risk  Recent Flowsheet Documentation  Taken 11/17/2022 0600 by Gwenlyn Saran, RN  Safety Interventions:   fall reduction program maintained   low bed  Taken 11/17/2022 0400 by Gwenlyn Saran, RN  Safety Interventions:   fall reduction program maintained   low bed  Taken 11/17/2022 0200 by Gwenlyn Saran, RN  Safety Interventions:   fall reduction program maintained   low bed  Taken 11/17/2022 0000 by Gwenlyn Saran, RN  Safety Interventions:   fall reduction program maintained   low bed  Taken 11/16/2022 2200 by Gwenlyn Saran, RN  Safety Interventions:   fall reduction program maintained   low bed  Taken 11/16/2022 2000 by Gwenlyn Saran, RN  Safety Interventions:   fall reduction program maintained   low bed  Intervention: Prevent Skin Injury  Recent Flowsheet Documentation  Taken 11/16/2022 2000 by Gwenlyn Saran, RN  Positioning for Skin: Sitting in Chair  Goal: Optimal Comfort and Wellbeing  Outcome: Progressing  Goal: Readiness for Transition of Care  Outcome: Progressing  Goal: Rounds/Family Conference  Outcome: Progressing     Problem: Infection  Goal: Absence of Infection Signs and Symptoms  Outcome: Progressing  Intervention: Prevent or Manage Infection  Recent Flowsheet Documentation  Taken 11/17/2022 0600 by Gwenlyn Saran, RN  Isolation Precautions: contact precautions maintained  Taken 11/17/2022 0400 by Gwenlyn Saran, RN  Isolation Precautions: contact precautions maintained  Taken 11/17/2022 0200 by Gwenlyn Saran, RN  Isolation Precautions: contact precautions maintained  Taken 11/17/2022 0000 by Gwenlyn Saran, RN  Isolation Precautions: contact precautions maintained  Taken 11/16/2022 2200 by Gwenlyn Saran, RN  Isolation Precautions: contact precautions maintained  Taken 11/16/2022 2000 by Gwenlyn Saran, RN  Isolation Precautions: contact precautions maintained     Problem: Fall Injury Risk  Goal: Absence of Fall and Fall-Related Injury  Outcome: Progressing  Intervention: Promote Injury-Free Environment  Recent Flowsheet Documentation  Taken 11/17/2022 0600 by Gwenlyn Saran, RN  Safety Interventions:   fall reduction program maintained   low bed  Taken 11/17/2022 0400 by Gwenlyn Saran, RN  Safety Interventions:   fall reduction program maintained   low bed  Taken 11/17/2022 0200 by Gwenlyn Saran, RN  Safety Interventions:   fall reduction program maintained   low bed  Taken 11/17/2022 0000 by Gwenlyn Saran, RN  Safety Interventions:   fall reduction program maintained   low bed  Taken 11/16/2022 2200 by Gwenlyn Saran, RN  Safety Interventions:   fall reduction program maintained   low bed  Taken 11/16/2022 2000 by Gwenlyn Saran, RN  Safety Interventions:   fall reduction program maintained   low bed     Problem: Pain Acute  Goal: Optimal Pain Control and Function  Outcome: Progressing     Problem: Gas Exchange Impaired  Goal: Optimal Gas Exchange  Outcome: Progressing  Intervention: Optimize Oxygenation and Ventilation  Recent Flowsheet Documentation  Taken 11/16/2022 2000 by Gwenlyn Saran, RN  Head of Bed (  HOB) Positioning: HOB at 30 degrees     Problem: Artificial Airway  Goal: Effective Communication  Outcome: Progressing  Goal: Optimal Device Function  Outcome: Progressing  Intervention: Optimize Device Care and Function  Recent Flowsheet Documentation  Taken 11/16/2022 2000 by Gwenlyn Saran, RN  Oral Care: teeth brushed  Goal: Absence of Device-Related Skin or Tissue Injury  Outcome: Progressing     Problem: Airway Clearance Ineffective  Goal: Effective Airway Clearance  Outcome: Progressing  Intervention: Promote Airway Secretion Clearance  Recent Flowsheet Documentation  Taken 11/16/2022 2000 by Gwenlyn Saran, RN  Activity Management: ambulated in room     Problem: Airway Clearance Ineffective  Goal: Effective Airway Clearance  Outcome: Progressing  Intervention: Promote Airway Secretion Clearance  Recent Flowsheet Documentation  Taken 11/16/2022 2000 by Gwenlyn Saran, RN  Activity Management: ambulated in room     Problem: Breathing Pattern Ineffective  Goal: Effective Breathing Pattern  Outcome: Progressing  Intervention: Promote Improved Breathing Pattern  Recent Flowsheet Documentation  Taken 11/16/2022 2000 by Gwenlyn Saran, RN  Head of Bed [facility]) Positioning: HOB at 30 degrees

## 2022-11-17 NOTE — Unmapped (Signed)
Pt received all scheduled treatments without adverse reactions with AWC. Everything rinsed and placed out to dry.

## 2022-11-17 NOTE — Unmapped (Signed)
Physician Discharge Summary HBR  3 BT1 HBR  [address]  [place] [address]  Dept: [phone]  Loc: [phone]     Identifying Information:   Jordan [name]  [birth year]  [id number]    Primary Care Physician: Irving Copas, MD   Code Status: Full Code    Admit Date: 11/07/2022    Discharge Date: 11/17/2022     Discharge To: Home    Discharge Service: HBR - HBC: Hospitalist Service #2     Discharge Attending Physician: Docia Furl, MD    Discharge Diagnoses:  Principal Problem:    Acute exacerbation of bronchiectasis (CMS-HCC) (POA: Yes)  Active Problems:    Essential hypertension (POA: Yes)    Chronic respiratory failure with hypoxia (CMS-HCC) (POA: Yes)  Resolved Problems:    * No resolved hospital problems. *      Outpatient Provider Follow Up Issues:   -Follow up with Dr. Garner Nash for duration of ABX therapy    Hospital Course:   JENRI HUNDT is a 67 y.o. male who is presenting to [facility] with Acute exacerbation of bronchiectasis (CMS-HCC), in the setting of the following pertinent/contributing co-morbidities: chronic hypoxemic respiratory failure, hypertension.     Acute exacerbation of bronchiectasis (CMS-HCC)   Acute on chronic hypoxemic respiratory failure  Follows with Dr. Garner Nash from Pulmonary Medicine for bronchiectasis. History of MDR Pseudomonas colonization. On 2L O2 via [place] at baseline. Presented with increasing dyspnea and production of purulent sputum over the last month. 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 Developed peripheral neuropathy possibly 2/2 voriconazole. Stable/improved off treatment. No complaints this morning.  Started on Ciprofloxacin 750mg  po BID, Zosyn IV, Tobramycin nebs per pulmonary recs and we continued his  home Azithromycin daily for anti-inflammatory affect.  His home trelegy (hasn't been able to start this due to insurance/financial issues) was substituted with duonebs and he received aggressive airway clearance QID with RT including hypertonic saline, metaneb, and vibralung.  In addition her received daily Pulmozyme.  PICC line for anticipated 2-3 weeks of abx was placed.  He will need  CRP and ESR weekly  on discharge.  His sputum culture was notable for 1+ mucoid Pseudomonas and AFB culture was pending at discharge.  Follow-up appointment with pulmonary scheduled for 01/26/23. Will substitute Spiriva/Symbicort while awaiting Trelegy approval      Essential hypertension - Aortic Root Dilation   BP acceptable  - Diltiazem 120 every day  - Atorvastatin 10 qhs      Constipation  - Colace qhs, as per his request.   - monitor for BMs and intensify if needed     GERD  - Pantoprazole    Procedures:     No admission procedures for hospital encounter.  ______________________________________________________________________  Discharge Medications:     Your Medication List        STOP taking these medications      coenzyme Q10 300 mg Cap     TRELEGY ELLIPTA 200-62.5-25 mcg Dsdv  Generic drug: fluticasone-umeclidin-vilanter            START taking these medications      budesonide-formoterol 160-4.5 mcg/actuation inhaler  Commonly known as: SYMBICORT  Inhale 2 puffs  in the morning and 2 puffs in the evening.     ciprofloxacin HCl 750 MG tablet  Commonly known as: CIPRO  Take 1 tablet (750 mg total) by mouth two (2) times a day for 14 days.  piperacillin-tazobactam 4.5 gram/100 mL Pgbk  Commonly known as: ZOSYN  Infuse 100 mL (4.5 g total) into a venous catheter every six (6) hours for 14 days.            CHANGE how you take these medications      albuterol 2.5 mg /3 mL (0.083 %) nebulizer solution  Inhale 3 mL by nebulization Three (3) times a day.  What changed: Another medication with the same name was changed. Make sure you understand how and when to take each.     albuterol 90 mcg/actuation inhaler  Commonly known as: PROVENTIL HFA;VENTOLIN HFA  Inhale 2 puffs four (4) times a day as needed.  What changed:   when to take this  reasons to take this            CONTINUE taking these medications      ALCOHOL PREP PADS Padm  Generic drug: alcohol swabs  Use as directed with inhaled antibiotics     ascorbic acid (vitamin C) 1000 MG tablet  Commonly known as: VITAMIN C  Take 1 tablet (1,000 mg total) by mouth daily.     atorvastatin 10 MG tablet  Commonly known as: LIPITOR  Take 1 tablet (10 mg total) by mouth daily.     azithromycin 250 MG tablet  Commonly known as: ZITHROMAX  TAKE 1 TABLET BY MOUTH EVERY DAY     BD POSIFLUSH NORMAL SALINE 0.9 injection  Generic drug: sodium chloride  Inject 2mL of 0.9%NaCl into colistin vial & gently mix. After withdrawing colistin dose, add an additional 1mL of 0.9%NaCl to neb cup with the colistin dose.     BD REGULAR BEVEL NEEDLES 21 gauge x 1 1/2 Ndle  Generic drug: needle (disp) 21 G  Use as directed with inhaled Colistin     CALCIUM 600 ORAL  Take 2 tablets by mouth two (2) times a day. 1 in morning and 1 in the evening     cholecalciferol (vitamin D3-50 mcg (2,000 unit)) 50 mcg (2,000 unit) tablet  Take 1 tablet (50 mcg total) by mouth daily.     colistimethate 150 mg injection  Commonly known as: COLYMYCIN  Inject 2 mL sterile water for injection to mix colistin vial, then draw up 2 mL (150mg ) and inhale 2 times a day, 28 days on and 28 days off.     dilTIAZem 120 MG 24 hr capsule  Commonly known as: CARDIZEM CD  Take 1 capsule (120 mg total) by mouth daily.     dornase alfa 1 mg/mL nebulizer solution  Commonly known as: PULMOZYME  Inhale 1 ampule (2.5 mg) daily. Use at least 30-60 minutes before airway clearance, or after airway clearance     empty container Misc  USE AS DIRECTED     empty container Misc  Commonly known as: sharps container  USE AS DIRECTED     empty container Misc  Use as directed to dispose of needles. When full, make sure lid is closed tightly then dispose of container in trash.     ferrous sulfate 325 (65 FE) MG tablet  Take 1 tablet (325 mg total) by mouth Two (2) times a day. BID     fluticasone propionate 50 mcg/actuation nasal spray  Commonly known as: FLONASE  1 spray into each nostril daily.     HYDROcodone-acetaminophen 7.5-325 mg per tablet  Commonly known as: NORCO  Take 1 tablet by mouth three (3) times a day (at 6am, noon and 6pm).  LC PLUS Misc  Generic drug: nebulizers  Use with inhaled medications     LC PLUS Misc  Generic drug: nebulizers  use with nebulized medications     montelukast 10 mg tablet  Commonly known as: SINGULAIR  TAKE 1 TABLET BY MOUTH EVERY DAY AT NIGHT     multivitamin-minerals-lutein Tab  Take 1 tablet by mouth daily.     NON FORMULARY  Take 650 mg by mouth two (2) times a day. Brand: Citracal 650mg      omeprazole 20 MG capsule  Commonly known as: PriLOSEC  Take 1 capsule (20 mg total) by mouth Two (2) times a day.     OXYGEN-AIR DELIVERY SYSTEMS MISC  Inhale 2 mL.     sodium chloride 10 % Nebu  Measure and Inhale 5 mL by nebulization Two (2) times a day. Discard remaining amount and use a new vial for each dose.     SPIRIVA RESPIMAT 2.5 mcg/actuation inhalation mist  Generic drug: tiotropium bromide  Inhale 2 puffs two (2) times a day.     sterile water Soln  Use 2mL to mix Colistin, then add additional 2mL to neb cup with 2mL of mixed colistin. Inhale twice daily 28 days on and 28 days off.     syringe with needle 3 mL 20 gauge x 1 1/2 Syrg  Commonly known as: BD LUER-LOK SYRINGE  For use with inhaled antibiotic (Colistin)     BD LUER-LOK SYRINGE 3 mL 21 gauge x 1 Syrg  Generic drug: syringe with needle  For use with inhaled antibiotic (Colistin)     tobramycin (PF) 300 mg/5 mL nebulizer solution  Commonly known as: [name]  Inhale the contents of 1 ampule (300 mg total) by nebulization every twelve (12) hours. 28 days on and 28 days off.     zinc gluconate 50 mg (7 mg elemental zinc) tablet  Take 1 tablet (50 mg total) by mouth.     zoledronic acid-mannitol&water 5 mg/100 mL Pgbk  Commonly known as: RECLAST  Infusion              Allergies:  Gentamicin, Tobramycin, Mirtazapine, and Morphine  ______________________________________________________________________  Pending Test Results (if blank, then none):  Pending Labs       Order Current Status    AFB culture Preliminary result            Most Recent Labs:  All lab results last 24 hours -   Recent Results (from the past 24 hour(s))   Spirometry    Collection Time: 11/17/22  9:13 AM   Result Value Ref Range    FVC PRE 2.04 (L) 2.89 - 4.88 L    FEV1 PRE 1.04 (L) 2.16 - 3.70 L    FEV1/FVC PRE 51.04 (L) 64.08 - 87.68 %    FEF25-75% PRE 0.32 (L) 1.01 - 4.12 L/s    ISOFEF25-75 PRE 0.32 L/s    FIVC PRE 1.81 (L) 2.89 - 4.88 L    FEF50% PRE 0.43 (L) 1.97 - 6.32 L/s    FIF50% PRED 2.34 L/s    PEF PRE 4.04 (L) 6.02 - 10.46 L/s    PIF PRE 2.84 (L) 3.81 - 10.93 L/s    FET PRE 11.32 sec    FET100% Change 11.30 sec    QMV/HQI69 pre 24.04 %    EOTT PRE 11.32 sec    Vol extrap pre 0.06 L    Grade FVC A19 PRE 1.00     Grade FEV1 A19  PRE 1.00     FEV1Q PRE 2.09      FVC LLN 2.89     FVC Predicted 3.87     FVC PreZ-Score -3.08     FVC % Predicted PRE 53 % %    FEV1 LLN 2.16     FEV1 Predicted 2.96     FEV1 PreZ-Score -3.74     FEV1 % Predicted PRE 35 % %    FEV1/FVC LLN 64     FEV1/FVC Predicted 77     FEV1/FVC PreZ-Score -2.87     FEV1/FVC % Predicted PRE 66 % %    FEF25-75% LLN 1.01     FEF25-75% Predicted 2.30     FEF25-75% PreZ-Score -3.06     FEF25-75% % Predicted PRE 14 % %    FVCIN LLN 2.89     FIVC Predicted 3.87     FVCIN PreZ-Score -3.49     FIVC % Predicted PRE 47 % %    FEF50% LLN 1.97     FEF50% Predicted 4.15     FEF50% PreZ-Score -2.82     FEF50% % Predicted PRE 10 % %    PEF LLN 6.02     PEF Predicted 8.24     PEF PreZ-Score -3.11     PEF % Predicted PRE 49 % %    PIF LLN 3.81     PIF Predicted 7.37     PIF PreZ-Score -0.23     PIF % Predicted PRE 39 % %    FVC PreZ-Score -3.08     FEV1 PreZ-Score -3.74     FEV1/FVC PreZ-Score -3     FVC %CHANGE -1.0 %    FEV1 %CHANGE 9.7 %       Relevant Studies/Radiology (if blank, then none):  ECG 12 Lead    Result Date: 11/10/2022  NORMAL SINUS RHYTHM LEFT ANTERIOR FASCICULAR BLOCK ABNORMAL ECG WHEN COMPARED WITH ECG OF 22-Dec-2021 13:29, NO SIGNIFICANT CHANGE WAS FOUND    XR Chest Portable    Result Date: 11/07/2022  EXAM: XR CHEST PORTABLE ACCESSION: [id number] UN CLINICAL INDICATION: PNEUMONIA  TECHNIQUE: Single View AP Chest Radiograph. COMPARISON: Chest CT dated 06/15/2022 FINDINGS: Stable architectural distortion, volume loss and bronchiectasis in right upper lobe and unchanged right basilar subsegmental atelectasis. Stable extensive bronchiectasis and bronchial wall thickening in left mid and lower lungs. No new consolidation. Probable chronic small right pleural effusion. No pneumothorax. Partially obscured cardiomediastinal silhouette. Stable rightward mediastinal deviation.     Stable architectural distortion, volume loss and bronchiectasis in right upper lobe and right basilar subsegmental atelectasis. Stable extensive bronchiectasis and bronchial wall thickening in the left lung. No new consolidation.   ______________________________________________________________________  Discharge Instructions:           Other Instructions       Discharge instructions      You were admitted to the hospital for a bronchiectasis flare. You have made improvements onf IV antibiotics and will be discharging you on and IV antibiotics called Zosyn and oral antibiotics called Ciprofloxacin. You will follow up with Dr. Garner Nash to determine the duration of treatment although likely 2-3 weeks. Return to care if worsening fevers, chills , shortness of breath.            Follow Up instructions and Outpatient Referrals     Ambulatory Referral to Home Infusion      Reason for referral: Home Infusion    Performing location?: External    Home Health Requested Disciplines: Nursing     **  Please contact your service pharmacist for assistance with discharge home health infusion monitoring.      Discharge instructions          Appointments which have been scheduled for you      Jan 26, 2023 9:40 AM  (Arrive by 9:25 AM)  PFT with PFT 1  [facility] PULMONARY SPECIALTY FUNCT [place]) [address]  [place] [address] through [address]  [place] [address]  [phone]   If you have inhaled breathing medications, please do not use it 4 hours prior to this breathing test.  Please wear comfortable clothing and well-fitting , closed toe shoes in the even that a 6-minute walk is part of your test.  If you have fallen in the past month please inform your respiratory therapist at the start of your appointment.    While you may bring a guest to your appointment, they will not be able to be present in the testing area.         Jan 26, 2023 10:00 AM  (Arrive by 9:45 AM)  RETURN BRONCHIECTSIS with Truett Mainland, MD  [facility] PULMONARY SPECIALTY CL [place] [facility]) [address]  [facility] 1 through 4  [place] [address]  [phone]        Feb 22, 2023 11:00 AM  (Arrive by 10:35 AM)  RETURN  NEUROMUSCULAR with Di Kindle, ACNP  [facility] NEUROLOGY CLINIC [place] [address] [place] [facility]) [address]  [address]  [place] [address]  [phone]             ______________________________________________________________________  Discharge Day Services:  BP 141/72  - Pulse 79  - Temp 36.3 ??C (97.4 ??F) (Temporal)  - Resp 20  - Ht 172.7 cm (5' 8)  - Wt 78.4 kg (172 lb 12.8 oz)  - SpO2 98%  - BMI 26.27 kg/m??   Pt seen on the day of discharge and determined appropriate for discharge.    Condition at Discharge: good    Length of Discharge: I spent greater than 30 mins in the discharge of this patient.

## 2022-11-18 DIAGNOSIS — M47817 Spondylosis without myelopathy or radiculopathy, lumbosacral region: Secondary | ICD-10-CM | POA: Diagnosis not present

## 2022-11-18 DIAGNOSIS — M47813 Spondylosis without myelopathy or radiculopathy, cervicothoracic region: Secondary | ICD-10-CM | POA: Diagnosis not present

## 2022-11-18 DIAGNOSIS — Z8701 Personal history of pneumonia (recurrent): Secondary | ICD-10-CM | POA: Diagnosis not present

## 2022-11-18 DIAGNOSIS — I10 Essential (primary) hypertension: Secondary | ICD-10-CM | POA: Diagnosis not present

## 2022-11-18 DIAGNOSIS — Z452 Encounter for adjustment and management of vascular access device: Secondary | ICD-10-CM | POA: Diagnosis not present

## 2022-11-18 DIAGNOSIS — M19012 Primary osteoarthritis, left shoulder: Secondary | ICD-10-CM | POA: Diagnosis not present

## 2022-11-18 DIAGNOSIS — M47815 Spondylosis without myelopathy or radiculopathy, thoracolumbar region: Secondary | ICD-10-CM | POA: Diagnosis not present

## 2022-11-18 DIAGNOSIS — Z902 Acquired absence of lung [part of]: Secondary | ICD-10-CM | POA: Diagnosis not present

## 2022-11-18 DIAGNOSIS — M47814 Spondylosis without myelopathy or radiculopathy, thoracic region: Secondary | ICD-10-CM | POA: Diagnosis not present

## 2022-11-18 DIAGNOSIS — Z792 Long term (current) use of antibiotics: Secondary | ICD-10-CM | POA: Diagnosis not present

## 2022-11-18 DIAGNOSIS — Z8546 Personal history of malignant neoplasm of prostate: Secondary | ICD-10-CM | POA: Diagnosis not present

## 2022-11-18 DIAGNOSIS — M47818 Spondylosis without myelopathy or radiculopathy, sacral and sacrococcygeal region: Secondary | ICD-10-CM | POA: Diagnosis not present

## 2022-11-18 DIAGNOSIS — I77819 Aortic ectasia, unspecified site: Secondary | ICD-10-CM | POA: Diagnosis not present

## 2022-11-18 DIAGNOSIS — Z9981 Dependence on supplemental oxygen: Secondary | ICD-10-CM | POA: Diagnosis not present

## 2022-11-18 DIAGNOSIS — J471 Bronchiectasis with (acute) exacerbation: Secondary | ICD-10-CM | POA: Diagnosis not present

## 2022-11-18 DIAGNOSIS — M47812 Spondylosis without myelopathy or radiculopathy, cervical region: Secondary | ICD-10-CM | POA: Diagnosis not present

## 2022-11-18 DIAGNOSIS — K219 Gastro-esophageal reflux disease without esophagitis: Secondary | ICD-10-CM | POA: Diagnosis not present

## 2022-11-18 DIAGNOSIS — M47816 Spondylosis without myelopathy or radiculopathy, lumbar region: Secondary | ICD-10-CM | POA: Diagnosis not present

## 2022-11-18 DIAGNOSIS — J9621 Acute and chronic respiratory failure with hypoxia: Secondary | ICD-10-CM | POA: Diagnosis not present

## 2022-11-18 NOTE — Unmapped (Signed)
Adult Pulmonary Specialty Clinic Pharmacist Note     Purpose: Clarify lab orders     November 18, 2022 8:42 AM: Phone call to [facility] Phone: [phone]. Patient is not serviced at this branch. Need to call [phone]    November 18, 2022 8:44 AM: Phone call to [facility] Phone: [phone]. Spoke with pharmacist, Amy and provided lab orders as below:    Collect on MONDAYS/TUESDAYS of each week:  CBC + diff  Comprehensive Metabolic Panel   C-reactive protein  ESR    Amy will pass this along to the nursing agency.    Total time spent: 5 minutes     Electronically signed:  Alben Spittle, PharmD, BCACP, CPP  Clinical Pharmacist Practitioner  [facility] Adult Cystic Fibrosis/Pulmonary Clinic  [phone]

## 2022-11-19 DIAGNOSIS — J471 Bronchiectasis with (acute) exacerbation: Secondary | ICD-10-CM | POA: Diagnosis not present

## 2022-11-20 DIAGNOSIS — S86111A Strain of other muscle(s) and tendon(s) of posterior muscle group at lower leg level, right leg, initial encounter: Secondary | ICD-10-CM | POA: Diagnosis not present

## 2022-11-20 NOTE — Unmapped (Signed)
Adult Pulmonary Specialty Clinic Pharmacist Note     Purpose: Clarify end of therapy    November 20, 2022 10:26 AM: Phone call to [facility] Phone: [phone]. Spoke with representative and updated end of therapy to be 9/7 and pull PICC line. Offered for our team to send orders to pull PICC and she said this was not neccessary and that she would pass this along to nursing.     Total time spent: 5 minutes     Electronically signed:  Alben Spittle, PharmD, BCACP, CPP  Clinical Pharmacist Practitioner  [facility] Adult Cystic Fibrosis/Pulmonary Clinic  [phone]

## 2022-11-21 ENCOUNTER — Other Ambulatory Visit: Payer: Self-pay | Admitting: Cardiology

## 2022-11-21 DIAGNOSIS — I471 Supraventricular tachycardia, unspecified: Secondary | ICD-10-CM

## 2022-11-21 DIAGNOSIS — I77810 Thoracic aortic ectasia: Secondary | ICD-10-CM

## 2022-11-23 DIAGNOSIS — G4733 Obstructive sleep apnea (adult) (pediatric): Secondary | ICD-10-CM | POA: Diagnosis not present

## 2022-11-23 DIAGNOSIS — J471 Bronchiectasis with (acute) exacerbation: Secondary | ICD-10-CM | POA: Diagnosis not present

## 2022-11-23 DIAGNOSIS — J479 Bronchiectasis, uncomplicated: Secondary | ICD-10-CM | POA: Diagnosis not present

## 2022-11-24 DIAGNOSIS — J471 Bronchiectasis with (acute) exacerbation: Secondary | ICD-10-CM | POA: Diagnosis not present

## 2022-11-24 DIAGNOSIS — J9621 Acute and chronic respiratory failure with hypoxia: Secondary | ICD-10-CM | POA: Diagnosis not present

## 2022-11-24 DIAGNOSIS — J479 Bronchiectasis, uncomplicated: Secondary | ICD-10-CM | POA: Diagnosis not present

## 2022-11-24 DIAGNOSIS — Z792 Long term (current) use of antibiotics: Principal | ICD-10-CM

## 2022-11-24 NOTE — Unmapped (Signed)
Outpatient Lab Results from 11/24/22 * no drug levels    Outpatient IV Antimicrobial Therapy:  Start Date: 11/07/22  End Date: 11/28/22  Line: PICC  Referring pulmonologist: Rolm Baptise, MD    IV Antibiotics Ordered:  Piperacillin-tazobactam (Zosyn) 4.5g IV every 6 hours    Oral/Inhaled Antibiotics:  Ciprofloxacin 750mg  by mouth twice a day   (x14 days; end ~ 9/10)  Inhaled tobramycin 300mg  via nebulizer twice a day (QOM)  Inhaled colistin 150mg  via nebulizer twice a day (QOM)  Azithromycin 250 mg oral every day       11/24/22      Reviewed with patient: (via mychart message)  Nausea/Vomiting/Diarrhea: Denies  Importance of hydration   Importance of reporting any new or worsening symptoms    PLAN     Overall patient tolerating treatment. Has not had previous labs since discharge from hospital. He states he feels ok, is just tired from keeping up with the treatments & not sleeping well.  Discussed end date with MD & ok to end 9/7; PICC removal orders sent to home health & home infusion, as well as notified patient of plan.  Lab results pending.      Note forwarded to CPP Alben Spittle, CPP    Welton Flakes, RN

## 2022-11-25 NOTE — Unmapped (Signed)
Bronchiectasis Clinic Pharmacist Note   Purpose: Outpatient Home IV Antibiotic Lab Monitoring  Results from 9/3. See [facility]    Outpatient IV Antimicrobial Therapy:  Start Date: 8/17  End Date: 9/7  Line: PICC  Referring pulmonologist: Rolm Baptise, MD    IV Antibiotics Ordered:  Piperacillin-tazobactam (Zosyn) 4.5g IV every 6 hours    Oral/Inhaled Antibiotics:  Ciprofloxacin 750mg  by mouth twice a day       Recommendations at this time unless clinically indicated otherwise:  Renal function improved (Cr 1.08 > 0.96). Dosing remains appropriate  General labs are  WNL/stable .   Continue Piperacillin-tazobactam (Zosyn) 4.5g IV every 6 hours    Next Labs:   No further follow up labs were ordered as patient due to complete day 21 of IV antibiotics on 9/7, will order labs if therapy extended       Electronically signed:  Alben Spittle, PharmD, BCACP, CPP  Clinical Pharmacist Practitioner  [facility] Adult Cystic Fibrosis/Pulmonary Clinic

## 2022-11-26 NOTE — Unmapped (Signed)
Left voice mail inquiring about how he's feeling & if Jordan [name] has set up a date/time to remove PICC (due to end intravenous antibiotics therapy 9/7). No need for return call, but did request patient let me know via call or mychart message once PICC is removed.

## 2022-11-27 DIAGNOSIS — J479 Bronchiectasis, uncomplicated: Principal | ICD-10-CM

## 2022-11-27 MED ORDER — DORNASE ALFA 1 MG/ML SOLUTION FOR INHALATION
Freq: Every day | RESPIRATORY_TRACT | 11 refills | 30 days | Status: CP
Start: 2022-11-27 — End: 2023-11-27

## 2022-11-30 DIAGNOSIS — J479 Bronchiectasis, uncomplicated: Principal | ICD-10-CM

## 2022-11-30 NOTE — Unmapped (Signed)
Verified with patient that PICC line was removed s/p completing intravenous antibiotics therapy.

## 2022-12-01 DIAGNOSIS — J471 Bronchiectasis with (acute) exacerbation: Secondary | ICD-10-CM | POA: Diagnosis not present

## 2022-12-01 MED ORDER — DILTIAZEM CD 120 MG CAPSULE,EXTENDED RELEASE 24 HR
ORAL_CAPSULE | Freq: Every day | ORAL | 3 refills | 90 days | Status: CP
Start: 2022-12-01 — End: ?

## 2022-12-11 NOTE — Unmapped (Signed)
[facility] Refill Coordination Note    Specialty Medication(s) to be Shipped:   CF/Pulmonary/Asthma: tobramycin pf 300mg /86ml neb sol    Other medication(s) to be shipped: sod chlor 10% neb sol     LANKFORD PARI, DOB: 16-Aug-1955  Phone: [phone] (home)       All above HIPAA information was verified with patient.     Was a Nurse, learning disability used for this call? No    Completed refill call assessment today to schedule patient's medication shipment from the [facility]  [phone]).  All relevant notes have been reviewed.     Specialty medication(s) and dose(s) confirmed: Regimen is correct and unchanged.   Changes to medications: Jebidiah reports no changes at this time.  Changes to insurance: No  New side effects reported not previously addressed with a pharmacist or physician: None reported  Questions for the pharmacist: No    Confirmed patient received a Conservation officer, historic buildings and a Surveyor, mining with first shipment. The patient will receive a drug information handout for each medication shipped and additional FDA Medication Guides as required.       DISEASE/MEDICATION-SPECIFIC INFORMATION        N/A    SPECIALTY MEDICATION ADHERENCE     Medication Adherence    Patient reported X missed doses in the last month: 0  Specialty Medication: tobramycin (PF) 300 mg/5 mL nebulizer solution ([name])  Support network for adherence: family member              Were doses missed due to medication being on hold? No    tobramycin pf 300mg /66ml neb sol  : 0 doses of medicine on hand       REFERRAL TO PHARMACIST     Referral to the pharmacist: Not needed      [facility]     Shipping address confirmed in Epic.       Delivery Scheduled: Yes, Expected medication delivery date: 10/1.     Medication will be delivered via UPS to the prescription address in Epic WAM.    [name]   [facility]  Specialty Technician

## 2022-12-17 DIAGNOSIS — J479 Bronchiectasis, uncomplicated: Secondary | ICD-10-CM | POA: Diagnosis not present

## 2022-12-17 DIAGNOSIS — G4733 Obstructive sleep apnea (adult) (pediatric): Secondary | ICD-10-CM | POA: Diagnosis not present

## 2022-12-17 DIAGNOSIS — J449 Chronic obstructive pulmonary disease, unspecified: Secondary | ICD-10-CM | POA: Diagnosis not present

## 2022-12-21 MED FILL — SODIUM CHLORIDE 10 % FOR NEBULIZATION: RESPIRATORY_TRACT | 30 days supply | Qty: 900 | Fill #1

## 2022-12-21 NOTE — Unmapped (Signed)
Spoke to representative @ Dr Recardo Evangelist office & requested labs from 11/24/22 & 12/01/22 be faxed to Scot Jun @ [phone]

## 2022-12-23 DIAGNOSIS — J479 Bronchiectasis, uncomplicated: Secondary | ICD-10-CM | POA: Diagnosis not present

## 2022-12-23 DIAGNOSIS — J471 Bronchiectasis with (acute) exacerbation: Secondary | ICD-10-CM | POA: Diagnosis not present

## 2022-12-23 DIAGNOSIS — G4733 Obstructive sleep apnea (adult) (pediatric): Secondary | ICD-10-CM | POA: Diagnosis not present

## 2022-12-28 DIAGNOSIS — J479 Bronchiectasis, uncomplicated: Principal | ICD-10-CM

## 2023-01-11 ENCOUNTER — Ambulatory Visit: Admit: 2023-01-11 | Discharge: 2023-01-12

## 2023-01-11 DIAGNOSIS — I10 Essential (primary) hypertension: Secondary | ICD-10-CM | POA: Diagnosis not present

## 2023-01-11 NOTE — Unmapped (Signed)
It was nice seeing you.     We will not make any medication changes today. (Dilt 120 daily). No need for any lasix today.     Please let me know if you develop any lower extremity swelling or palpitation.

## 2023-01-11 NOTE — Unmapped (Unsigned)
DIVISION OF CARDIOLOGY  [facility] [place]        Date of Service: 01/11/2023    PCP: Referring Provider:   Irving Copas, MD  [address] [place] [address]  Phone: [phone]  Fax: [phone] Docia Furl, MD  [address]  [place],  [place] [address]  Phone: [phone]  Fax: [phone]     Assessment and Plan:   # Paroxysmal Supraventricular Tachycardia  # Palpitations - Resolved.   Mr. Feinstein is a 67 year old M with extensive pulmonary disease; bronchiectasis, chronic pseudomonal colonization, asperilloma, chronic hypoxic respiratory failure. He feels his disease is progressing with worsening breathlessness and coughing.  He uses 2L O2 on exertion and while sleeping. CV risk factors include HTN, OSA, age, male gender.  He has a prior dx of PSVT (AFib) late 2022 while in pulm rehab wearing a monitor.  Zio patch 10/2022 without any evidence of AFib. He is on Dilt 120 ER for BP control and suppression of SVTs / palpitations.      Has a high bleed risk and has had PA artery embolization-  as such, AC in Mr. Mcginley would not be benign. If AF is found, we spoke about a possible Watchman procedure, wihich he is ammendable to to help decrease his risk of CVA.     TTE reassuringly showed a preserved L and R heart.  As such, I do not see an need for a standing diuretic.     Plan:  -Continue Dilt 120 ER for HTN and addition of AV nodal blockade (SVTs).   -If AF is ever detected, consider Watchman given elevated bleed risk.     # Lower extremity Edema - resolved  # Chronic shortness of breath on exertion  # Chronic hypoxic respiratory failure on 2L at night and w/ activity  # Severe bronchiectasis and pseudomonal colonization.   Notes improvement in his lower extremity edema. Weights have been stable. TTE 01/12/2022 without any significnat R heart strain. This is quite reassuring, as such, I question the need for a daily diuretic (without L or R heart disease at this time).   Plan:  -Continue off Lasix.      Primary prevention parameters:  - The 10-year ASCVD risk score (Arnett DK, et al., 2019) is: 19.1%    Values used to calculate the score:      Age: 50 years      Sex: Male      Is Non-Hispanic African American: No      Diabetic: No      Tobacco smoker: No      Systolic Blood Pressure: 125 mmHg      Is BP treated: Yes      HDL Cholesterol: 46 mg/dL      Total Cholesterol: 217 mg/dL    Note: For patients with SBP <90 or >200, Total Cholesterol <130 or >320, HDL <20 or >100 which are outside of the allowable range, the calculator will use these upper or lower values to calculate the patient???s risk score.    - No results found for: LDL    -   Lab Results   Component Value Date    A1C 5.3 01/05/2022     -   Lab Results   Component Value Date    CREATININE 1.08 11/12/2022     No follow-ups on file.    Subjective:        Reason for Consultation: PSVT  History of Present Illness: Mr. Delapp is a very pleasant 67 y.o. male with a history of HTN, OSA, brochiectasis c/b chronic pseudomonas colonization and chronic hypoxic respiratory failure req 2L O2 with activity, history of RML lobectomy/RLL wedge recection, RUL aspergillooma .The patient is seen at the request of Docia Furl for evaluation of PSVT (Afib) which was discovered late 2022 during pulmonary rehab.    RPA embolization in December 2022 for hemoptysis.       Cardiology is asked to see Mr. Arbon for a history of paroxysmal SVT.  Unclear which SVT this is as it has not been captured on Zio or ECG.  CT-A Chest 03/2021 demonstrated extensive bronchiectasis and signs of ongoing endobronchial infection.  Per chart review, PSVT managed with Dilt ER 120mg . While in pulmonary rehab 8/11, notes of 40 events of fast rate or afib.      October 3rd. Dilt ER 120.     No HF or CHF. He uses 2-3 thin pillows at night and has not changed them recently.  With oxygen he is able to do yard work. Even with O2 he has to takes breaks and has it down to 87%. 2L at home , 4L with activity. No CP or Chest heaviness.     He is unaware that he is in afib. He would see the pulm rehab monitor tell him he was in Afib.  His most recent hospital stays he was not in AFib and his heart rate was always below 90s. No Paplps, or pre-syncoal features.      Interval:  -No palpitations. He is not terribly active.   -Hospitalized 11 days for bronchiectasis flare.   -He is able to sleep at night. 2L O2 at night.   -No PND or orthopnea since his hospitalization.  -No LE edema. Stabilized off lasix.  After stopping his appetite stimulant  -BP is good 118/56 and stable at home as well. (115-120 SBP).   -Felt lightheaded early last week, after eating a meal, it went away.     Cardiovascular History:  PSVT, unclear type. Likely AFib vs. MAT.     Fam Hx:  Father - CHF (late 81s).   Mother - AFib    I have independently reviewed of all the diagnostic studies. I have reviewed the old medical records from [facility] prior to this clinic visit.     Cardiovascular Studies Date Comments     ECG 12/22/2021 NSR. LAFB.   Echo 01/12/2022 Summary    1. Technically difficult study due to chest wall/lung interference.  No  parasternal windows available.    2. The left ventricular systolic function is normal, LVEF is visually  estimated at 55%.    3. There is mild aortic regurgitation.    4. The right ventricle is normal in size, with normal systolic function.   Stress test     Cardiac catheterization     CYP2C19 Genotype     Electrophysiology      Cardiovascular Surgery     Peripheral Vascular Studies     I have personally reviewed the recent imaging studies on this patient.    Past medical history:  Patient Active Problem List   Diagnosis    Bronchiectasis (CMS-HCC)    Esophagitis    Pseudomonas aeruginosa infection    Daytime somnolence    Memory loss    Hypersomnia with sleep apnea    Essential hypertension    Osteoarthrosis    Dizziness    Excessive daytime  sleepiness    Sleep disturbances    Loculated pleural effusion    Moderate Pharyngeal dysphagia    IDA (iron deficiency anemia)    Disequilibrium    Gastroesophageal reflux disease without esophagitis    Hoarseness    Dyspnea on exertion    Chronic respiratory failure with hypoxia (CMS-HCC)    Hemoptysis    Pulmonary aspergilloma (CMS-HCC)    Sensory neuropathy    Lumbosacral radiculopathy    Other osteoporosis without current pathological fracture    Acute exacerbation of bronchiectasis (CMS-HCC)       Medications:   Patient's Medications   New Prescriptions    No medications on file   Previous Medications    ALBUTEROL 2.5 MG /3 ML (0.083 %) NEBULIZER SOLUTION    Inhale 3 mL by nebulization Three (3) times a day.    ALBUTEROL HFA 90 MCG/ACTUATION INHALER    Inhale 2 puffs four (4) times a day as needed.    ALCOHOL SWABS (ALCOHOL PREP PADS) PADM    Use as directed with inhaled antibiotics    ASCORBIC ACID, VITAMIN C, (VITAMIN C) 1000 MG TABLET    Take 1 tablet (1,000 mg total) by mouth daily.    ATORVASTATIN (LIPITOR) 10 MG TABLET    Take 1 tablet (10 mg total) by mouth daily.    AZITHROMYCIN (ZITHROMAX) 250 MG TABLET    TAKE 1 TABLET BY MOUTH EVERY DAY    CALCIUM CARBONATE (CALCIUM 600 ORAL)    Take 2 tablets by mouth two (2) times a day. 1 in morning and 1 in the evening    CHOLECALCIFEROL, VITAMIN D3-50 MCG, 2,000 UNIT,, 50 MCG (2,000 UNIT) TABLET    Take 1 tablet (50 mcg total) by mouth daily.    COLISTIMETHATE (COLYMYCIN) 150 MG INJECTION    Inject 2 mL sterile water for injection to mix colistin vial, then draw up 2 mL (150mg ) and inhale 2 times a day, 28 days on and 28 days off.    DILTIAZEM (CARDIZEM CD) 120 MG 24 HR CAPSULE    Take 1 capsule (120 mg total) by mouth daily.    DORNASE ALFA (PULMOZYME) 1 MG/ML NEBULIZER SOLUTION    Inhale 1 ampule (2.5 mg) daily. Use at least 30-60 minutes before airway clearance, or after airway clearance    EMPTY CONTAINER (SHARPS CONTAINER) MISC    USE AS DIRECTED    EMPTY CONTAINER MISC USE AS DIRECTED    EMPTY CONTAINER MISC    Use as directed to dispose of needles. When full, make sure lid is closed tightly then dispose of container in trash.    FERROUS SULFATE 325 (65 FE) MG TABLET    Take 1 tablet (325 mg total) by mouth Two (2) times a day. BID    FLUTICASONE FUROATE-VILANTEROL (BREO ELLIPTA) 100-25 MCG/DOSE INHALER    Inhale 1 puff daily. 1 puff daily    FLUTICASONE PROPIONATE (FLONASE) 50 MCG/ACTUATION NASAL SPRAY    1 spray into each nostril daily.    HYDROCODONE-ACETAMINOPHEN (NORCO) 7.5-325 MG PER TABLET    Take 1 tablet by mouth three (3) times a day (at 6am, noon and 6pm).    MONTELUKAST (SINGULAIR) 10 MG TABLET    TAKE 1 TABLET BY MOUTH EVERY DAY AT NIGHT    MULTIVITAMIN-MINERALS-LUTEIN TAB    Take 1 tablet by mouth daily.    NEBULIZERS (LC PLUS) MISC    Use with inhaled medications    NEBULIZERS MISC    use with nebulized  medications    NEEDLE, DISP, 21 G (BD REGULAR BEVEL NEEDLES) 21 GAUGE X 1 1/2 NDLE    Use as directed with inhaled Colistin    NON FORMULARY    Take 650 mg by mouth two (2) times a day. Brand: Citracal 650mg     OMEPRAZOLE (PRILOSEC) 20 MG CAPSULE    Take 1 capsule (20 mg total) by mouth Two (2) times a day.    OXYGEN-AIR DELIVERY SYSTEMS MISC    Inhale 2 mL.    SODIUM CHLORIDE (BD POSIFLUSH NORMAL SALINE 0.9) 0.9 % INJECTION    Inject 2mL of 0.9%NaCl into colistin vial & gently mix. After withdrawing colistin dose, add an additional 1mL of 0.9%NaCl to neb cup with the colistin dose.    SODIUM CHLORIDE 10 % NEBU    Measure and Inhale 5 mL by nebulization Two (2) times a day. Discard remaining amount and use a new vial for each dose.    STERILE WATER SOLN    Use 2mL to mix Colistin, then add additional 2mL to neb cup with 2mL of mixed colistin. Inhale twice daily 28 days on and 28 days off.    SYRINGE WITH NEEDLE (BD LUER-LOK SYRINGE) 3 ML 20 GAUGE X 1 1/2 SYRG    For use with inhaled antibiotic (Colistin)    SYRINGE WITH NEEDLE (BD LUER-LOK SYRINGE) 3 ML 21 GAUGE X 1 SYRG    For use with inhaled antibiotic (Colistin)    TIOTROPIUM BROMIDE (SPIRIVA RESPIMAT) 2.5 MCG/ACTUATION INHALATION MIST    Inhale 2 puffs two (2) times a day.    TOBRAMYCIN, PF, ([name]) 300 MG/5 ML NEBULIZER SOLUTION    Inhale the contents of 1 ampule (300 mg total) by nebulization every twelve (12) hours. 28 days on and 28 days off.    ZINC GLUCONATE 50 MG (7 MG ELEMENTAL ZINC) TABLET    Take 1 tablet (50 mg total) by mouth.    ZOLEDRONIC ACID-MANNITOL&WATER (RECLAST) 5 MG/100 ML PGBK    Infusion   Modified Medications    No medications on file   Discontinued Medications    No medications on file       Allergies:  Allergies   Allergen Reactions    Gentamicin Other (See Comments)     BALANCE ISSUES    Tobramycin Other (See Comments)     ototoxicity    Mirtazapine Other (See Comments)     Lower extremity swelling    Morphine Nausea And Vomiting       Social History:  He  reports that he has never smoked. He has never used smokeless tobacco. He reports that he does not drink alcohol and does not use drugs.    Family History:  His family history includes Alzheimer's disease in his mother; Asthma in his son; Bronchiectasis  in his brother, brother, and daughter; Diabetes in his father; Heart failure in his father; Liver disease in his father; Stroke in his mother.    Review of Systems  10 systems were reviewed and negative except as noted in HPI.      Objective:       Physical Exam  There were no vitals taken for this visit.   Wt Readings from Last 3 Encounters:   11/07/22 78.4 kg (172 lb 12.8 oz)   10/23/22 79.8 kg (176 lb)   09/17/22 80.7 kg (178 lb)     General:  Alert, no distress. Wife at side.    Eyes:  Intact, sclerae anicteric.  Neck: No carotid bruit.    Respiratory:   CTAB bilaterally with normal WOB.   Cardiovascular:  No carotid bruit, JVD 5 cm at 90 degrees, RRR without m/r/g.  1+ edema bilaterally.     Most recent labs   Lab Results   Component Value Date    Sodium 141 11/12/2022    Sodium Whole Blood 137 03/24/2015    Potassium 3.6 11/12/2022    Potassium, Bld 3.9 03/24/2015    Chloride 106 11/12/2022    Chloride 93 (L) 08/13/2013    CO2 31.7 (H) 11/12/2022    CO2 32 (H) 08/13/2013    BUN 19 11/12/2022    BUN 24 (H) 08/13/2013    Creatinine Whole Blood, POC 1.2 04/15/2021    Creatinine Whole Blood, POC 0.4 (L) 06/12/2014    Creatinine 1.08 11/12/2022    Magnesium 1.5 (L) 12/22/2021    Magnesium 2.0 08/13/2013     Lab Results   Component Value Date    HGB 12.2 (L) 11/12/2022    Hemoglobin 14.8 03/24/2015    MCV 92.9 11/12/2022    MCV 88 08/13/2013    Platelet 154 11/12/2022    Platelet 292 08/13/2013     Lab Results   Component Value Date    Hemoglobin A1C 5.3 01/05/2022    TSH 1.334 10/14/2021    INR 0.95 04/14/2017    INR 1.0 08/03/2013          A total of 25 minutes was spent on this visit reviewing previous notes, counseling the patient on this clinical conditions, ordering tests , adjusting meds, and documenting the findings in the note.     Patient note was created using dragon dictation. Any errors in syntax or proofreading may not have been identified and edited on initial review prior to signing this note.      Pecola Leisure, M.D.  [employer] General Cardiology Fellow, PGY-5 08/13/2013     Lab Results   Component Value Date    Hemoglobin A1C 5.3 01/05/2022    TSH 1.334 10/14/2021    INR 0.95 04/14/2017    INR 1.0 08/03/2013          A total of 35 minutes was spent on this visit reviewing previous notes, counseling the patient on this clinical conditions, ordering tests , adjusting meds, and documenting the findings in the note.       Patient note was created using dragon dictation. Any errors in syntax or proofreading may not have been identified and edited on initial review prior to signing this note.

## 2023-01-13 NOTE — Unmapped (Signed)
[name] Refill Coordination Note    Specialty Medication(s) to be Shipped:   CF/Pulmonary/Asthma: colistimethate 150 mg injection (COLYMYCIN)    Other medication(s) to be shipped:  alcohol prep pads, BD Luer-Lok syringe  3ml, BD Posiflush, BD Regular bevel needle, empty container     Jordan [name], DOB: 1955-08-14  Phone: [phone] (home)       All above HIPAA information was verified with patient.     Was a Nurse, learning disability used for this call? No    Completed refill call assessment today to schedule patient's medication shipment from the [name]  [phone]).  All relevant notes have been reviewed.     Specialty medication(s) and dose(s) confirmed: Regimen is correct and unchanged.   Changes to medications: Jordan [name] reports no changes at this time.  Changes to insurance: No  New side effects reported not previously addressed with a pharmacist or physician: None reported  Questions for the pharmacist: No    Confirmed patient received a Conservation officer, historic buildings and a Surveyor, mining with first shipment. The patient will receive a drug information handout for each medication shipped and additional FDA Medication Guides as required.       DISEASE/MEDICATION-SPECIFIC INFORMATION        For CF patients: CF [facility] Active? Yes    SPECIALTY MEDICATION ADHERENCE     Medication Adherence    Patient reported X missed doses in the last month: 0  Specialty Medication: colistimethate 150 mg injection (COLYMYCIN)  Patient is on additional specialty medications: No  Patient is on more than two specialty medications: No  Any gaps in refill history greater than 2 weeks in the last 3 months: no  Demonstrates understanding of importance of adherence: yes  Informant: patient  Reliability of informant: reliable  Provider-estimated medication adherence level: good  Patient is at risk for Non-Adherence: No  Reasons for non-adherence: no problems identified  Support network for adherence: family member              Were doses missed due to medication being on hold? No    colistimethate 150 mg injection (COLYMYCIN)  : 0 days of medicine on hand       REFERRAL TO PHARMACIST     Referral to the pharmacist: Not needed      Jordan [name]     Shipping address confirmed in Epic.       Delivery Scheduled: Yes, Expected medication delivery date: 01/19/23.     Medication will be delivered via UPS to the prescription address in Epic WAM.    [name]  Specialty Technician

## 2023-01-16 DIAGNOSIS — J479 Bronchiectasis, uncomplicated: Secondary | ICD-10-CM | POA: Diagnosis not present

## 2023-01-16 DIAGNOSIS — J449 Chronic obstructive pulmonary disease, unspecified: Secondary | ICD-10-CM | POA: Diagnosis not present

## 2023-01-16 DIAGNOSIS — G4733 Obstructive sleep apnea (adult) (pediatric): Secondary | ICD-10-CM | POA: Diagnosis not present

## 2023-01-17 NOTE — Unmapped (Signed)
Attending Physician Attestation:      I confirm that I interviewed and examined the patient, reviewed all the relevant lab and diagnostic data, and we agreed upon a plan of treatment. I agree with the findings, assessment, and plan as documented by Dr. Maryruth Hancock.    Vella Raring, MD

## 2023-01-18 DIAGNOSIS — C61 Malignant neoplasm of prostate: Secondary | ICD-10-CM | POA: Diagnosis not present

## 2023-01-18 MED FILL — ALCOHOL PREP PADS: 50 days supply | Qty: 100 | Fill #2

## 2023-01-18 MED FILL — EMPTY CONTAINER: 90 days supply | Qty: 1 | Fill #2

## 2023-01-18 MED FILL — COLISTIN (COLISTIMETHATE SODIUM) 150 MG SOLUTION FOR INJECTION: 30 days supply | Qty: 60 | Fill #3

## 2023-01-18 MED FILL — BD LUER-LOK SYRINGE 3 ML 21 GAUGE X 1": 30 days supply | Qty: 60 | Fill #2

## 2023-01-18 MED FILL — BD POSIFLUSH NORMAL SALINE 0.9 % INJECTION SYRINGE: 30 days supply | Qty: 180 | Fill #4

## 2023-01-18 MED FILL — BD REGULAR BEVEL NEEDLES 21 GAUGE X 1 1/2": 30 days supply | Qty: 60 | Fill #5

## 2023-01-21 NOTE — Unmapped (Signed)
Pt called that Dr Concepcion Elk next available appt in Feb 21st and pt is due for reclast infusion and wanted to see if MD has any availability around Dec.  Routed to Dr Concepcion Elk.

## 2023-01-23 DIAGNOSIS — J479 Bronchiectasis, uncomplicated: Secondary | ICD-10-CM | POA: Diagnosis not present

## 2023-01-23 DIAGNOSIS — G4733 Obstructive sleep apnea (adult) (pediatric): Secondary | ICD-10-CM | POA: Diagnosis not present

## 2023-01-23 DIAGNOSIS — J471 Bronchiectasis with (acute) exacerbation: Secondary | ICD-10-CM | POA: Diagnosis not present

## 2023-01-25 DIAGNOSIS — N393 Stress incontinence (female) (male): Secondary | ICD-10-CM | POA: Diagnosis not present

## 2023-01-25 DIAGNOSIS — C61 Malignant neoplasm of prostate: Secondary | ICD-10-CM | POA: Diagnosis not present

## 2023-01-25 DIAGNOSIS — N5201 Erectile dysfunction due to arterial insufficiency: Secondary | ICD-10-CM | POA: Diagnosis not present

## 2023-01-25 DIAGNOSIS — J479 Bronchiectasis, uncomplicated: Principal | ICD-10-CM

## 2023-01-26 ENCOUNTER — Ambulatory Visit
Admit: 2023-01-26 | Discharge: 2023-01-29 | Disposition: A | Discharge status: Home / Self Care | Payer: PRIVATE HEALTH INSURANCE

## 2023-01-26 ENCOUNTER — Ambulatory Visit
Admit: 2023-01-26 | Discharge: 2023-01-26 | Payer: PRIVATE HEALTH INSURANCE | Attending: Internal Medicine | Primary: Internal Medicine

## 2023-01-26 ENCOUNTER — Ambulatory Visit: Admit: 2023-01-26 | Discharge: 2023-01-26 | Payer: PRIVATE HEALTH INSURANCE

## 2023-01-26 DIAGNOSIS — J479 Bronchiectasis, uncomplicated: Principal | ICD-10-CM

## 2023-01-26 DIAGNOSIS — Z7952 Long term (current) use of systemic steroids: Secondary | ICD-10-CM | POA: Diagnosis not present

## 2023-01-26 DIAGNOSIS — Z79891 Long term (current) use of opiate analgesic: Secondary | ICD-10-CM | POA: Diagnosis not present

## 2023-01-26 DIAGNOSIS — B9789 Other viral agents as the cause of diseases classified elsewhere: Secondary | ICD-10-CM | POA: Diagnosis not present

## 2023-01-26 DIAGNOSIS — R0902 Hypoxemia: Secondary | ICD-10-CM | POA: Diagnosis not present

## 2023-01-26 DIAGNOSIS — Z792 Long term (current) use of antibiotics: Secondary | ICD-10-CM | POA: Diagnosis not present

## 2023-01-26 DIAGNOSIS — B965 Pseudomonas (aeruginosa) (mallei) (pseudomallei) as the cause of diseases classified elsewhere: Secondary | ICD-10-CM | POA: Diagnosis not present

## 2023-01-26 DIAGNOSIS — I10 Essential (primary) hypertension: Secondary | ICD-10-CM | POA: Diagnosis not present

## 2023-01-26 DIAGNOSIS — Z79899 Other long term (current) drug therapy: Secondary | ICD-10-CM | POA: Diagnosis not present

## 2023-01-26 DIAGNOSIS — Z9981 Dependence on supplemental oxygen: Secondary | ICD-10-CM | POA: Diagnosis not present

## 2023-01-26 DIAGNOSIS — I471 Supraventricular tachycardia, unspecified: Secondary | ICD-10-CM | POA: Diagnosis not present

## 2023-01-26 DIAGNOSIS — K219 Gastro-esophageal reflux disease without esophagitis: Secondary | ICD-10-CM | POA: Diagnosis not present

## 2023-01-26 DIAGNOSIS — J471 Bronchiectasis with (acute) exacerbation: Secondary | ICD-10-CM | POA: Diagnosis not present

## 2023-01-26 DIAGNOSIS — D509 Iron deficiency anemia, unspecified: Secondary | ICD-10-CM | POA: Diagnosis not present

## 2023-01-26 DIAGNOSIS — E785 Hyperlipidemia, unspecified: Secondary | ICD-10-CM | POA: Diagnosis not present

## 2023-01-26 DIAGNOSIS — J9611 Chronic respiratory failure with hypoxia: Secondary | ICD-10-CM | POA: Diagnosis not present

## 2023-01-26 DIAGNOSIS — G8929 Other chronic pain: Secondary | ICD-10-CM | POA: Diagnosis not present

## 2023-01-26 DIAGNOSIS — B441 Other pulmonary aspergillosis: Secondary | ICD-10-CM | POA: Diagnosis not present

## 2023-01-26 DIAGNOSIS — Z1152 Encounter for screening for COVID-19: Secondary | ICD-10-CM | POA: Diagnosis not present

## 2023-01-26 DIAGNOSIS — E611 Iron deficiency: Principal | ICD-10-CM

## 2023-01-26 DIAGNOSIS — R5383 Other fatigue: Principal | ICD-10-CM

## 2023-01-26 LAB — C-REACTIVE PROTEIN: C-REACTIVE PROTEIN: 8 mg/L (ref <=10.0)

## 2023-01-26 LAB — COMPREHENSIVE METABOLIC PANEL
ALBUMIN: 3.5 g/dL (ref 3.4–5.0)
ALKALINE PHOSPHATASE: 75 U/L (ref 46–116)
ALT (SGPT): 8 U/L — ABNORMAL LOW (ref 10–49)
ANION GAP: 6 mmol/L (ref 5–14)
AST (SGOT): 9 U/L (ref <=34)
BILIRUBIN TOTAL: 0.3 mg/dL (ref 0.3–1.2)
BLOOD UREA NITROGEN: 18 mg/dL (ref 9–23)
BUN / CREAT RATIO: 17
CALCIUM: 9.5 mg/dL (ref 8.7–10.4)
CHLORIDE: 107 mmol/L (ref 98–107)
CO2: 27.2 mmol/L (ref 20.0–31.0)
CREATININE: 1.05 mg/dL
EGFR CKD-EPI (2021) MALE: 77 mL/min/{1.73_m2} (ref >=60)
GLUCOSE RANDOM: 89 mg/dL (ref 70–179)
POTASSIUM: 4.4 mmol/L (ref 3.4–4.8)
PROTEIN TOTAL: 7.1 g/dL (ref 5.7–8.2)
SODIUM: 140 mmol/L (ref 135–145)

## 2023-01-26 LAB — CBC W/ AUTO DIFF
BASOPHILS ABSOLUTE COUNT: 0.1 10*9/L (ref 0.0–0.1)
BASOPHILS RELATIVE PERCENT: 1 %
EOSINOPHILS ABSOLUTE COUNT: 0.4 10*9/L (ref 0.0–0.5)
EOSINOPHILS RELATIVE PERCENT: 6 %
HEMATOCRIT: 40.3 % (ref 39.0–48.0)
HEMOGLOBIN: 13.5 g/dL (ref 12.9–16.5)
LYMPHOCYTES ABSOLUTE COUNT: 1.5 10*9/L (ref 1.1–3.6)
LYMPHOCYTES RELATIVE PERCENT: 22.7 %
MEAN CORPUSCULAR HEMOGLOBIN CONC: 33.4 g/dL (ref 32.0–36.0)
MEAN CORPUSCULAR HEMOGLOBIN: 30.3 pg (ref 25.9–32.4)
MEAN CORPUSCULAR VOLUME: 90.6 fL (ref 77.6–95.7)
MEAN PLATELET VOLUME: 8.2 fL (ref 6.8–10.7)
MONOCYTES ABSOLUTE COUNT: 0.6 10*9/L (ref 0.3–0.8)
MONOCYTES RELATIVE PERCENT: 9.8 %
NEUTROPHILS ABSOLUTE COUNT: 3.9 10*9/L (ref 1.8–7.8)
NEUTROPHILS RELATIVE PERCENT: 60.5 %
PLATELET COUNT: 188 10*9/L (ref 150–450)
RED BLOOD CELL COUNT: 4.45 10*12/L (ref 4.26–5.60)
RED CELL DISTRIBUTION WIDTH: 12.7 % (ref 12.2–15.2)
WBC ADJUSTED: 6.5 10*9/L (ref 3.6–11.2)

## 2023-01-26 LAB — IRON & TIBC
IRON SATURATION: 42 % (ref 20–55)
IRON: 92 ug/dL
TOTAL IRON BINDING CAPACITY: 220 ug/dL — ABNORMAL LOW (ref 250–425)

## 2023-01-26 LAB — SEDIMENTATION RATE: ERYTHROCYTE SEDIMENTATION RATE: 46 mm/h — ABNORMAL HIGH (ref 0–20)

## 2023-01-26 LAB — PRO-BNP: PRO-BNP: 721 pg/mL — ABNORMAL HIGH (ref <=300.0)

## 2023-01-26 LAB — FERRITIN: FERRITIN: 533.2 ng/mL — ABNORMAL HIGH

## 2023-01-26 MED ORDER — SPIRIVA RESPIMAT 2.5 MCG/ACTUATION SOLUTION FOR INHALATION
Freq: Every day | RESPIRATORY_TRACT | 3 refills | 0 days | Status: SS
Start: 2023-01-26 — End: ?

## 2023-01-26 MED ADMIN — arformoterol (BROVANA) nebulizer solution 15 mcg/2 mL: 15 ug | RESPIRATORY_TRACT | @ 21:00:00

## 2023-01-26 MED ADMIN — budesonide (PULMICORT) nebulizer solution 0.5 mg: .5 mg | RESPIRATORY_TRACT | @ 21:00:00

## 2023-01-26 MED ADMIN — sodium chloride 7% NEBULIZER solution 4 mL: 4 mL | RESPIRATORY_TRACT | @ 21:00:00

## 2023-01-26 MED ADMIN — predniSONE (DELTASONE) tablet 40 mg: 40 mg | ORAL | @ 20:00:00 | Stop: 2023-01-31

## 2023-01-26 MED ADMIN — ipratropium-albuterol (DUO-NEB) 0.5-2.5 mg/3 mL nebulizer solution 3 mL: 3 mL | RESPIRATORY_TRACT | @ 21:00:00

## 2023-01-26 MED ADMIN — cefTAZidime (FORTAZ) 2 g in sodium chloride 0.9 % (NS) 100 mL IVPB-MBP: 2 g | INTRAVENOUS | @ 20:00:00 | Stop: 2023-02-02

## 2023-01-26 NOTE — Unmapped (Addendum)
[facility] Medicine   History and Physical       Assessment and Plan     Jordan [name] is a 67 y.o. male who is presenting to [facility] with bronchiectasis exacerbation in setting of chronic hypoxic history failure, chronic bronchiectasis, history of pulmonary aspergillosis, history of pulmonary MDRO Pseudomonas      Bronchiectasis exacerbation  History of pulmonary aspergillosis  History of MDRO Pseudomonas aeruginosa respiratory infection  Chronic hypoxic respiratory failure  Illness that poses a threat to life or bodily function without appropriate treatment  -Presented to pulmonology clinic on 11/05 after having progressive increasing dyspnea, cough with sputum production over the last couple weeks, due to concerns of bronchiectasis exacerbation, patient was directly admitted to hospital  -Note history of hemoptysis status post right upper lobe arterial embolization  Plan:  -No clinical signs of volume overload and low suspicion for heart failure -- reviewed previous echo, hold off on new echo at this time  -Labs obtained in clinic including CBC, CMP, ESR and CRP on 01/26/23 reviewed and remarkable for elevated ESR. Continue to monitor daily labs including CBC and BMP for now  -Check AFB smear, lower respiratory culture, MRSA nares, can consider repeat aspergillosis antigen serum  -Airway clearance with hypertonic nebs 7% q6 hours, vibrachest, duonebs, pulmicort and brovana  -Pulmozyme per Pulmonology   -Consider metanebs   -Check CXR, consider CT chest given RUL aspergillosis history and bronchiectasis  -EKG, telemetry for now  -Continuous oxygen monitoring, consider HFNC for humidification if additional airway clearance is needed  -Continue home oxygen requirements of 2 L, patient currently on baseline oxygen requirement  -Start prednisone 40 mg qdaily   -Continue Singulair nightly  -Respiratory culture from 10/2022 with both mucoid and smooth PsA -- both susceptibble to ceftazidime and zosyn but fluorquinolone and tobramycin resistant -- will place on ceftazidime   -Tobramycin nebs BID continued (note allergy to IV formulation, not nebulizer, note PsA culture resistant however for different indication)  -Continue chronic Azithromycin daily   -Monitor for signs of recurrent hemoptysis, note he has previous history of embolization of right upper lobe vasculature      Secondary/Additional Active Problems:    Essential hypertension   Paroxysmal supraventricular tachycardia  Aortic Root Dilation   Plan:  - Diltiazem 120 every day    Hyperlipidemia?  Plan:  Continue atorvastatin for now, check lipid panel/ASCVD risk    GERD  -Protonix      Prophylaxis  -Ambulation    Diet  -Full diet    Code Status / HCDM  -Full Code, Discussed with patient at the time of admission   -  HCDM (HCPOA): Ryo, Garr - [phone]    Anticipated Medically Ready for Discharge: Anticipated in 2-4 Days      Issues Impacting Complexity of Management:  -The patient is at high risk of complications from bronchiectasis exacerbation-history of pulmonary aspergillosis and hemoptysis    I personally spent greater than 75 minutes face-to-face and non-face-to-face in the care of this patient, which includes all pre, intra, and post visit time on the date of service.  All documented time was specific to the E/M visit and does not include any procedures that may have been performed.    HPI      Jordan [name] is a 67 y.o. male who is presenting to [facility] with bronchiectasis exacerbation.    Past medical history of pulmonary aspergillosis, pulmonary arterial bleed, chronic bronchiectasis, chronic hypoxic respiratory failure who presented to pulmonology  clinic on January 26, 2023 for routine visit.  Of note patient over the last 2 weeks has had progressive dyspnea on exertion, increasing cough sputum production.  States that sputum production is changing color to brown from yellow.  Denies any hemoptysis.  CT of the 5 pleuritic-like chest pain infrequently whenever he coughs.  Denies any pleuritic chest pain with deep inspiration.  Denies any orthopnea, paroxysmal nocturnal dyspnea.  Denies any lower extremity swelling.  Denies any diaphoresis, fevers, night sweats.  States he is chronically on azithromycin however has not been on any recent prednisone courses or antibiotics otherwise.  He is taking Pulmozyme as prescribed daily.      Med Rec Confidence   I reviewed the Medication List. The current list is Accurate    Physical Exam   Heart Rate:  [80] 80  BP: (134)/(74) 134/74  SpO2:  [94 %] 94 %  There is no height or weight on file to calculate BMI.    General - NAD  Eyes - PERRLA, EOM intact, conjunctive anicteric   Head and Neck - No noticeable or palpable swelling, redness or rash around throat or on face   Lymph Nodes - No lymphadenopathy    Cardiovascular - RRR no m/r/g, no JVD, no carotid bruits, no edema  Lungs - Clear to auscltation, no use of acessory muscles, very mild wheezing bilaterally  Skin - No rashes, skin warm and dry, no erythematous areas      Psychiatry -  Alert and oriented x 4  Abdomen - Normal bowel sounds, abdomen soft and nontender    Extremities - no edema, notable clubbing    Musculo-Skeletal - 5/5 strength, normal range of motion, no swollen or erythematous  joints.    Neurological - Alert and oriented x 4, CN 2-12 grossly intact

## 2023-01-26 NOTE — Unmapped (Addendum)
Labs today.    Admission for steroids and clearance and antibiotics.    Spiriva sent to [facility].    Symptoms of a bronchiectasis exacerbation: 48 hours of at least two of the following:  Increased cough  Change in volume or appearance of sputum  Increased sputum purulence  Worsening shortness of breath and/or exercise tolerance  Fatigue and/or malaise  Coughing up blood (hemoptysis)    If you are experiencing some of these symptoms:  Increase the frequency and/or intensity of your airway clearance  Try to submit a sputum sample for bacterial and AFB cultures (at [facility] or locally)  Reach out to me or your local physician.  If you need antibiotics, recommend treating for 14 days.      Please bring any new airway clearance equipment to your next visit to ensure that you are using and caring for it properly.    Thank you for allowing me to be a part of your care. Please call the clinic with any questions.    Viona Gilmore, MD, MPH  Pulmonary and Critical Care Medicine  [address]  [id number]  [place], [place] [address]    Thank you for your visit to the [facility]. You may receive a survey from [facility] regarding your visit today, and we are eager to use this feedback to improve your experience. Thank you for taking the time to fill it out.    Between appointments, you can reach [place] at these numbers:  For appointments: [phone]  For my nurse, Alvino Blood: [phone]  Fax: [phone]  For urgent issues after hours: Hospital Operator @ [phone] & ask for Pulmonary Fellow on call    My [facility] Chart is for non-urgent messages. This means you have a simple medical question that does not require an immediate response.     If you need immediate attention, call 911.     Responses may take up to 3 business days. Your message will be read by your provider or another medical team member who may respond on your provider???s behalf.    Some questions cannot be answered through messages in My [facility] Chart. Depending on your question, your provider???s office may ask you to schedule an appointment.     Information sent through My [facility] Chart will become part of your medical record.    For further information, check out the websites below:    General Information:  [facility] Bronchiectasis/NTM Care and Research Center: [email or web address]  Information about bronchiectasis (BE): Speak Up In BE - Page for People With Bronchiectasis ([email or web address])   Information about Non-tuberculous Mycobacterial Infections:  https://www.ntminfo.org   Videos from the patient session of the World Bronchiectasis Conference in [place] on October 03, 2016 can be watched here (TrustyNews.es).   Online Community for Individuals with Bronchiectasis and/or NTM through the Bronchiectasis Registry and the COPD Foundation: https://www.mckee-young.org/    Airway Clearance, Exercise, and Care:  Impact Airway Clearance Education: http://www.impact-be.com  Bronchiectasis Toolbox (information about airway clearance): DiningCalendar.de  Exercise program by [facility] [place]: http://www.brown.com/  Be Clear with Bronchiectasis by Toniann Ket, MPH (health educator and patient): https://www.letsbecleartoday.com/    Research Opportunities:  Interested in our bronchiectasis and NTM clinical trials: https://go.ToyArticles.ca      Interested in other clinical trials and research opportunities?  www.clinicaltrials.gov  The Rare Diseases Clinical Research Network [facility]) Genetic Disorders of Mucociliary Clearance Consortium [facility]) Contact Registry is a way for patients with disorders of mucociliary clearance (such  as bronchiectasis) and their family members to learn about research studies they may be able to join. Participation is completely voluntary and you may choose to withdraw at any time. There is no cost to join the [employer]. For more information or to join the registry please go to the following website:  [email or web address]

## 2023-01-26 NOTE — Unmapped (Signed)
Patient directly admitted this shift for bronchiectasis. VSS on 2LO2. Oriented to unit and plan of care. Pt is alert and oriented x4. No complaints or signs/symptoms of pain noted. Pt seen by OT this shift. Tolerating IV antibiotics. Taking pills whole. Independent in room and continent of bowel and bladder. Universal fall precautions in place.     12hr chart check completed.     6CIT Score for Cognitive Screening:  Complete for all new admissions, scoring found on the back of the Kardex  If a patient has known dementia, no need to complete the 6CIT  [] Patient has dementia  [x] Normal 6CIT (<8)    [] Significant 6CIT (8 or higher)  [] Needs to be completed    CAM Delirium Screening Tool  Be sure to ask questions to gauge orientation and attention during your assessment  Feature Present? Scoring: CAM Positive if:   Acute onset (different from baseline) with Fluctuating course [] Yes    [x] No   Yes to BOTH of these AND.Marland KitchenMarland Kitchen   Inattention (days of the week or months of the year backward) [] Yes    [x] No    Disorganized thinking [] Yes    [x] No   Yes to at least   ONE of these   Altered level of consciousness  [] Yes    [x] No    CAM []  Positive  [x] Negative    CAM-S: Symptom Severity Score  Complete on all patients >=67 years old, including in patients with dementia  Do not complete if the patient is known to be non-verbal due to their dementia  Feature  Severity Score  Question   ACUTE ONSET  & FLUCTUATING  COURSE [x] No (0)    [] Yes (1)    Is there evidence of an acute change in mental status from the patient's baseline? Did the patient's behavior fluctuate at any point during the interview for any of the 10 features?   2. INATTENTION [x] No (0)    [] Yes, mild (1)  [] Yes, marked (2)  Did the patient have difficulty focusing attention, for example being easily distractible, or having difficulty keeping track of what was being said?   3. DISORGANIZED  THINKING [x] No (0)    [] Yes, mild (1)  [] Yes, marked (2)  Was the patient's thinking disorganized or incoherent, such as rambling or irrelevant conversation, unclear or illogical flow or of ideas, unpredictable switching from subject to subject?   4. ALTERED LEVEL  OF  CONSCIOUSNESS [x] Normal (0)    [] Mild: vigilant or lethargic (1)  [] Marked: stupor or coma (2)    Overall, how would you rate the patient's level of consciousness?   -Alert (normal)   -Vigilant  -Lethargic  -Stupor  -Coma  -Uncertain   5. DISORIENTATION [x] No (0)    [] Yes, mild (1)  [] Yes, marked (2)  Was the patient disoriented at any time during the interview, such as thinking he/she was somewhere other than the hospital, using the wrong bed, or misjudging the time of day?   6. MEMORY  IMPAIRMENT [x] No (0)    [] Yes, mild (1)  [] Yes, marked (2)    Did the patient demonstrate any memory problems during the interview, such as inability to remember events in the hospital or difficulty remembering instructions?   7. PERCEPTUAL  DISTURBANCES [x] No (0)    [] Yes, mild (1)  [] Yes, marked (2)  Did the patient have any evidence of perceptual disturbances, for example, hallucinations, illusions, or misinterpretations (such as thinking something was moving when it was not)?   8. PSYCHOMOTOR  AGITATION [x] No (0)    [] Yes, mild (1)  [] Yes, marked (2)  At any time during the interview, did the patient have an unusually increased level of motor activity, such as restlessness, picking at bedclothes, tapping fingers, or making frequent sudden changes of position?   9. PSYCHOMOTOR  RETARDATION [x] No (0)    [] Yes, mild (1)  [] Yes, marked (2)  At any time during the interview, did the patient have an unusually decreased level of motor activity, such as sluggishness, staring into space, staying in one position for a long time, or moving very slowly?   10. ALTERED  SLEEP-WAKE  CYCLE  [x] No (0)    [] Yes, mild (1)  [] Yes, marked (2)  Did the patient have evidence of disturbance of the sleep-wake cycle, such as excessive daytime sleepiness with insomnia at   night?   Long Form   SEVERITY SCORE:  Severity Score (add rows 1-10)  Total (0-19):  0   Scoring the CAM-S: Rate each symptom of delirium listed in the CAM instrument as absent (0), mild (1), marked (2). Acute onset or fluctuation is rated as absent (0) or present (1). Summarize these scores into a composite.         Problem: Adult Inpatient Plan of Care  Goal: Plan of Care Review  Outcome: Ongoing - Unchanged  Goal: Patient-Specific Goal (Individualized)  Outcome: Ongoing - Unchanged  Goal: Absence of Hospital-Acquired Illness or Injury  Outcome: Ongoing - Unchanged  Goal: Optimal Comfort and Wellbeing  Outcome: Ongoing - Unchanged  Goal: Readiness for Transition of Care  Outcome: Ongoing - Unchanged  Goal: Rounds/Family Conference  Outcome: Ongoing - Unchanged

## 2023-01-26 NOTE — Unmapped (Signed)
[facility]   Assessment:      Patient:Jordan [name] Resides (01/24/1956)    Jordan [name] is a 67 y.o. male seen for follow up of bronchiectasis with chronic Pseudomonas colonization and chronic hypoxemic respiratory failure requiring supplemental oxygen with exertion and sleep.  He is reporting several weeks of increased cough and fatigue though only slight change in sputum production.  I wonder if some of this presentation is due to the absence of his Spiriva for a month.  Jordan [name] has low reserve for any disruption of his respiratory homeostasis.  I asked him whether he preferred trial of prednisone 40 mg daily for 5 days and resuming his Spiriva versus being admitted for intensification of airway clearance and antibiotics.  Although he was not excited about being admitted, he agreed that it was probably the best option for him.    Bronchiectasis Severity Index Score: Calculated on 10/23/22.    Age 73-69 = 2   BMI 18.5 or higher = 0   FEV1 % predicted 30-49% = 2   Hospitalized for severe exacerbation in past 2 years Yes = 5   Exacerbations in previous year 0-2 = 0   mMRC dyspnea score 3 = I stop for breath after walking about 100 yards or after a few minutes on level ground.   Chronic Pseudomonas colonization (at least 2 positive cultures at least 3 months apart within 1 year) Yes = 3   Colonization with other potential pathogenic bacteria (at least 2 positive cultures at least 3 months apart within 1 year) * No = 0   Radiologic Severity 3 or more lobes or cystic brx = 1       Total Score 16     BSI score of 9+ = Severe bronchiectasis (1 year outcomes 7.6 - 10.5% mortality, 16.7 - 52.6% hospitalization rate; 4 year outcomes 9.9 - 29.2% mortality, 41.2 - 80.4% hospitalization)     * NTM is not included.    Jordan [name] et al. The Bronchiectasis Severity Index: An International Derivation and Validation Study. AJRCCM, 2013; 189(5): [phone].)      Plan:      Problem List Items Addressed This Visit          Respiratory    Bronchiectasis (CMS-HCC) (Chronic)    Relevant Medications    tiotropium bromide (SPIRIVA RESPIMAT) 2.5 mcg/actuation inhalation mist    Other Relevant Orders    Comprehensive Metabolic Panel (Completed)    CBC w/ Differential (Completed)    Ferritin (Completed)    Iron & TIBC (Completed)    C-reactive protein (Completed)    Sedimentation Rate (Completed)    AFB SMEAR    Chronic respiratory failure with hypoxia (CMS-HCC) - Primary    Relevant Medications    tiotropium bromide (SPIRIVA RESPIMAT) 2.5 mcg/actuation inhalation mist       Other    IDA (iron deficiency anemia)    Relevant Orders    Iron & TIBC (Completed)     Other Visit Diagnoses       Fatigue, unspecified type        Relevant Orders    CBC w/ Differential (Completed)    Ferritin (Completed)    Iron & TIBC (Completed)    C-reactive protein (Completed)    Sedimentation Rate (Completed)    Iron deficiency        Relevant Orders    Ferritin (Completed)        Therapies:  Patient did not need to  meet with the respiratory therapist today.  No changes were made to inhaled medications.  Sent a refill of his Spiriva to [facility].  In the new calendar year, we can discuss switching him over to Trelegy.  Admission plan noted in separate note.    Testing:  Patient spontaneously expectorated sputum that was sent for bacterial and AFB cultures. These results will help to guide antibiotic therapy.  The lower respiratory culture was not processed but I have reached out to the lab tech requesting that they run it regardless as is noted on the comments on the order.  If he has future exacerbations, sputum should be sent for bacterial and AFB cultures. The bacterial culture should be processed like a cystic fibrosis sample given the overlapping pathogens. There are standing orders for these cultures in our system.  Blood work including CBC-D, CMP, ESR, CRP, iron panel, ferritin drawn in clinic.    Referrals:  None.    Vaccinations:  COVID-19: Up to date  Flu: Up to date  Pneumococcal: Up to date  Tdap: Up to date  RSV: Up to date    Jordan [name] will return to clinic in 3 months for follow-up with pre-bronchodilator spirometry and sputum cultures.  He will call or send me a message via MyChart if questions or concerns arise before this visit. Jordan [name] is in agreement with the above plan.     Subjective:      HPI: Jordan [name] is a 67 y.o. male who is seen for follow up of bronchiectasis with PsA colonization and prior history of multiple lung resections.    09/27/18:  I last saw him on 05/17/18. He reached out at the end of May reporting symptoms c/w an exacerbation. I treated him with a two week course of Ciprofloxacin, which seemed to help.  Reports that he feels winded too much - if walks up incline or up a couple hundred feet. This has worsened over the past year. Has to stop and let lungs catch up.  Of note, he has been off testosterone supplementation for the past year after he was diagnosed with prostate cancer.    He received the [facility], which he is using with nebulized HTS 7% and Pulmozyme. After using the Vest for 6-8 weeks, he noticed improvement with more phlegm mobilization. Sometimes has to pause it to cough to get stuff up.  Plugging feeling happening a lot more, especially with the vest.  Most of the time, sputum is thinned out with Pulmozyme but still gets those plugs.    Had to stop eating ice cream or drinking milkshake - will cough will eating and that makes him vomit on occasion. Past few weeks, when eating will cough, no matter what he eats.  Happens almost once a day. Doesn't feel like things get stuck typically.      02/07/19:  Following his last visit, Jordan [name] reached out regarding portable oxygen.  We had considered the life 2000 but he does not qualify because he does not use supplemental oxygen.  He performed the 6-minute walk test prior to today's visit but did not desaturate. He wonders if this is because the test was done in the morning and he doesn't have breathing issues in the morning.  Typically, breathing issues occur in afternoon.  During these episodes, he will have to use his inhaler and feels like he is suffocating.  He describes it as an uncomfortable feeling that limits his ability to do things  with family.  If he is having trouble catching breath, oximetry is 92-93%. Has seen it in the 80s if comes in from outside and is short of breath.    For airway clearance, he is nebulizing 10% hypertonic saline and using his percussive vest for 20 minute sessions in AM and PM. Has to pause it to cough and get stuff up.  Coughing up more stuff since last visit and is waking up 1-2 times a night with cough.having coughing fits between clearance session.  What he expectorates is less of a mucous plug and mostly stringy sputum that is yellowish to light brown in color.  No hemoptysis.  On Saturday, he had a coughing fit that nearly triggered posttussive emesis.  He only expectorated about 3-4 globs of phlegm.  Nebulizing Pulmozyme daily.  Alternates [name] and colistin every other month.  On [name] this month and will start Colistin on Dec 1.    06/06/19:  Coughing more and more sputum.  Waking up 1-2 times per night coughing. Can cough once or just keep coughing.  Sputum is yellow in color without evidence of blood.  He is not expectorating green sputum currently. On colistin this month; alternates with [name]. Went through January off all inhaled antibiotics.  Oxygen levels in the low 90s.  Uses rescue inhaler if goes up stairs.  Regularly performing airway clearance with nebulized 10% hypertonic saline twice a day, percussive vest twice a day, and Pulmozyme daily. He last received IV antibiotics in January 2019 but has been treated with oral antibiotics on 04/19/18 and 08/17/18, 03/10/19.      11/10/19:  1-2 times per week can get sharp right sided chest pain - even with nipple line. Can happen with clearance or activity or rest. Never during sleep. Following his hospitalization, he first experienced it in June. Lasts only about 10 seconds. Not reproducible. Limited activity due to dyspnea. Regularly performing airway clearance with nebulized 10% hypertonic saline twice a day, percussive vest twice a day, and Pulmozyme daily. Remains on chronic azithromycin therapy.    02/13/20:  Showed up to [facility] with oxygen saturations of 87-88% after walking from parking lot (<100 yd) and up incline without supplemental oxygen.  Using 2L continuous flow with exertion and sleep.  Had sleep study done at [facility] on RA and was told that he should be fine with just the supplemental oxygen. I do not have this report. Using oxygen when goes to football game; needs long period to recover from activity.  Going up 20 foot incline with oxygen, will have to stop and catch breath.     Wheezing more lately and endorses chest tightness. Has need to use albuterol as rescue 3-4 times per day.  Used albuterol in the car on the way here. Typically would be able to cough up large amount of mucus after PFTs but only coughed up small amount. Feels like mucus is stuck in lungs despite twice daily airway clearance with 10% HTS and Vest. Also using Pulmozyme once a day and Symbicort twice a day after clearance.  Using colistin this month. He ordered the [name] nebs to use next month even though insurance won't cover it. Feels like inhaled antibiotics definitely help.    05/10/20:   Saw [facility] for lung transplant evaluation.  Felt to be poor candidate due to prior surgeries and primary disease.  Candidacy closed at [facility]. Referral sent to [facility] and [place] on 2/15.  Based on labs and testing  done there, getting Hep B vaccination series (not Hep A or Hep B immune).  Also getting DEXA that was ordered by Dr. Abigail Miyamoto.    Had PSG done at [facility] - no evidence of OSA but does have increased upper airway resistance.  He never sleeps on his back; only sleeps on side or stomach.   Fatigue is most burdensome symptom.    Regrets that he let the surgeon operate on his lung before he met me.  He completed pulmonary rehab at [facility] but they only had him on machine for ~30 minutes and then stretching.  No strength exercises. He is planning on joining a gym based on recommendations from [facility] transplant.  Was surprised that he didn't desaturate during his walk test at [facility].    07/02/20:  At last visit, referred him to [facility] for pulmonary rehab. Found to have osteoporosis in hips. Turned down for transplant at [facility] based on chart review.  No change in breathing or cough.  Every once in a while, has bad coughing spells.  Same sputum volume and color. Globby to stringy. Not dark brown or bloody.  Sputum is light brown to yellowish.  Breathing about the same.  Started on 1200 mg calcium for osteoporosis.  Starting telehealth pulmonary rehab - feels optimistic that it will help him do more.    10/31/20:  Heart monitor for pulm rehab - had 40 events of fast rate or afib.  Has felt heart fluttering.  Did event monitor for 8 days.  Getting echocardiogram on Wednesday and then seeing cardiologist the next week. Was having sharp pains over right abdomen below ribs.  Once a month, now once every 2 weeks.    03/18/21:  Since his last clinic visit, he was evaluated by cardiology at [facility] (October) and then was admitted to [facility] earlier this month due to hemoptysis that occurred while on ciprofloxacin for an exacerbation.  He also had worsening shortness of breath.  Was discharged home on 21 days of meropenem.  Did not undergo embolization or bronchoscopy.     Hasn't felt good since August.  No energy, lost weight. End Nov, asked for Cipro and for few days felt better. Then didn't feel better and was ordeal to see PCP.  WBC elevated in ER - they called me. Friday night/Saturday started seeing dark blood in sputum.  Monday night 8PM, got breathing treatment with bright red globs.  Large volume blood and phlegm. They were going to do bronchoscopy but then bleeding improved with change in abx, holding 10% saline. Was continued on percussive vest and Pulmozyme. Friday to Saturday, wine colored.  Then pinkish. Sunday night could barely see blood and then gone Monday.    Finished Meropenem last night (3 weeks total). Christmas eve morning, started seeing bright red streaks. Yesterday, coughed up bright red blood that was 50/50 with phlegm.  Today more wine colored.   Shortness of breath better with meropenem but no change in hemoptysis.    07/01/21:  After last visit, we attempted to perform bronchoscopy but due to hypoxemia, we canceled the procedure.  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  He also had a visit with Dr. Erven Colla in interventional radiology and is scheduled for bronchial artery embolization later this month.    Feels off balance but not dizzy/spinning.  This has been intensifying with increasing the voriconazole dose. Happening daily  every time he stands up. No hearing changes. Head feels heavy.  No vision changes nor double visions. Sinuses up and down, bleed a little bit.  Some post-nasal drip.     10/14/21:  At last visit, pursued [employer] for his work of breathing. Started mirtazapine for appetite, mood, and sleep. Underwent BAE on 07/11/21. Tapering off voriconazole due to development of peripheral neuropathy and lower extremity swelling.  Had been on voriconazole for about 5 months.    Swelling started slightly in April and has progressed. Neuropathy started in tips of toes a few weeks ago but now at ankles.  More shortness of breath when laying flat.  Has woken up at night short of breath.  Work of breathing outside is better on L2K.    11/21/21:  At last visit, reporting ascending peripheral neuropathy and more rapidly progressive lower extremity edema. Planned to continue voriconazole 100 mg daily and repeat chest CT to see if any evolution of his mycetoma. No lab evidence that he is absorbing his inhaled antibiotics.    Since last visit, swelling seems to have increased.  Lasix seemed to help for 2 days but then no difference aside from no longer waking up night to urinate.  Neuropathy to the ankles bilaterally.  Feeling more swollen in legs and even abdomen feels     04/14/22:  At last visit, stopped voriconazole, ordered CT abd/pelvis, referred to endocrinology. Since then, he saw speech therapy for swallowing exercises.  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 Additional left-sided lumbosacral radiculopathy (L5-S1) of chronic nature is also noted.    Since that visit, reached out reporting symptoms of exacerbation. He dropped a sputum at [facility] for culture but there is nothing in their system showing a sample was received/process.  Prescribed Cipro but didn't feel like he got as much improvement from it as usual.  Feeling washed out. Neg COVID tests.  No fever.     08/04/22:  At last visit, wasn't feeling 100% after oral/inhaled antibiotics and we discussed potentially treating with IV antibiotics as outpatient. Having periods of time when coughing spells worsen and then improve again.  Did treat him with Augmentin in early February.  Has been having difficulty with L2K unit at home.      Waiting for L2[employer] to work - 2 months. Got new regulator and then stopped working.  Spoke with someone there and since it works on Clinical research associate, told its the regulator.  [employer] is sending someone from [place] to prove that its the regulator.  If oxygen briefly in home with activity, drops to mid-80s.    10/23/22:  At last visit, made no changes to his airway clearance regimen nor medications; placed referral for colonoscopy to be done at [facility]. Since then, he feels he has had more frequent spells of chest tightness, wheezing, and difficulty expectorating sputum. For example, he had a hard time with this around the time of evening nebulizer treatment a week ago and last night. He also noted desaturation with O2 off of his nose with 20-50 feet ambulation inside his house. It slowly went back to low 90s once settled in and putting nasal cannula back. He denies hemoptysis or fever, chills around this episode.     01/26/23:  At last visit, planned for scheduled admission for bronchiectasis tune up.  Completed 3 weeks of IV Zosyn, oral Cipro, and inhaled [name].  Was feeling much better after treatment.  Worsening cough, sputum, and  fatigue over past few weeks.  Has felt so poorly that he felt the need to take a COVID test (was negative).  Coughing was so bad last night that his wife stated if we were not coming to see Dr. Garner Nash tomorrow, I would tell you to go to [facility].  Significant rhinorrhea but no sore throat.  Body aches but no fever.     Respiratory Symptoms:   Cough: Over past 3-4 weeks, cough has increased but markedly worse over past few day.  Sputum a little thicker and darker.  Not feeling like drowning in sputum but coughing/fatigue is burdensome.   Nocturnal awakenings: 1-2 times per month  Wheezing: intermittent - could feel it last night.  Chest tightness: absent.  Rescue albuterol use: 2-3 times each day for shortness of breath, which is increased from prior.  Pleurisy:  Last week when taking breathing treatments, had 2 separate instances of sharp stabbing pain behind the right scapula.    Hemoptysis: Nothing since embolization.  MMRC Stage: 4 = I am too breathless to leave the house or I am breathless when dressing.     Exacerbations:   Number of exacerbations treated in the past year: 4  Dates of exacerbations: 03/2018, 07/2018, 02/2019, 09/2019, 12/2019, 10/2020, 01/2021, 07/2021, 03/2022, 04/2022, 07/2022, 10/2022  Number of hospitalizations for exacerbations in the past 2 years: 2  Dates of hospitalizations: 01/2015, 03/2015, 01/2016, 03/2017, 09/2019, 10/2020, 02/2021, 10/2022    Pulmonary Therapies:   Airway clearance: Pulmozyme daily, HTS 10% bid, percussive vest bid.   Inhaled antibiotics: Alternates [name] and Colistin monthly - on Colistin in odd number of months and [name] in even number of months.   Inhalers: Symbicort 160/4.5, Spiriva Respimat.  He held on filling the Trelegy because he had enough Symbicort at home.  Has been out of Spiriva for about a month.  Chronic antibiotics: azithromycin 250 mg daily.   Exercise: Was walking 3 miles at 2.1 without stopping on the treadmill.  Pulmonary Rehab: Telehealth pulmonary rehab July 2022  Supplemental oxygen: 2L continuous at rest and with sleep. Increased to 4-5L with activity. Has not changed  NIPPV: No    Past Medical History:   Diagnosis Date    Abscess of lung (CMS-HCC) 11/06/2010    CT Chest 10/22/10 12/05/2010 right thoracotomy resection of right middle lobe and resection of right lower lobe abscess     Arthritis     back    Biceps tendon tear 2013    left side    Bronchiectasis (CMS-HCC)     chronic psuedomonas infection    Degenerative joint disease of left acromioclavicular joint     GERD (gastroesophageal reflux disease)     Hypertension     Obstructive sleep apnea on CPAP 03/14/13    AHI 33.5, on BiPAP 12/8 based on sleep study 09/2013    Pneumonia 2012    Lung abcess     PONV (postoperative nausea and vomiting)     Prostate cancer (CMS-HCC) 09/09/2017    Rotator cuff injury left    Vertigo        Past Surgical History:   Procedure Laterality Date    APPENDECTOMY  1969    BACK SURGERY      BRONCHOSCOPY  10/19/2010    [facility]    CARPAL TUNNEL RELEASE  1999    IR EMBOLIZATION HEMORRHAGE ART OR VEN  LYMPHATIC EXTRAVASATION  07/11/2021    IR EMBOLIZATION HEMORRHAGE ART OR VEN  LYMPHATIC EXTRAVASATION 07/11/2021  Braulio Conte, MD IMG VIR [facility]    LUNG REMOVAL, PARTIAL Right 10/2010    RML and RLL partially resected    MENISCECTOMY  2011 NECK SURGERY  1993, 1999, 2002    PR GERD TST W/ MUCOS IMPEDE ELECTROD,>1HR N/A 07/06/2012    Procedure: ESOPHAGEAL FUNCTION TEST, GASTROESOPHAGEAL REFLUX TEST W/ NASAL CATHETER INTRALUMINAL IMPEDANCE ELECTRODE(S) PLACEMENT, RECORDING, ANALYSIS AND INTERPRETATION; PROLONGED;  Surgeon: None None;  [facility];  Service: Gastroenterology    PR OPEN TREAT RIB FRACTURE W/INT FIXATION, UNILATERAL, 1-2 RIBS Right 03/19/2015    Procedure: OPEN TREATMENT OF RIB FRACTURE REQUIRING INTERNAL FIXATION, UNILATERAL; 1-2 RIBS;  Surgeon: Evert Kohl, MD;  Location: MAIN OR [place];  Service: Cardiothoracic    PR THORACOTOMY W/THERAP WEDGE RESEXN ADDL IPSILATRL Right 08/07/2013    Procedure: THORACOTOMY; WITH THERAPEUTIC LOBECTOMY OF RIGHT MIDDLE AND RIGHT LOWER LOBE RESECTION , EACH ADDITIONAL RESECTION, IPSILATERAL;  Surgeon: Alvester Chou, MD;  Location: MAIN OR ;  Service: Cardiothoracic    TONSILLECTOMY  1965       Family History   Problem Relation Age of Onset    Alzheimer's disease Mother     Stroke Mother     Bronchiectasis  Brother         presumed    Bronchiectasis  Brother         presumed    Liver disease Father     Diabetes Father     Heart failure Father     Bronchiectasis  Daughter     Asthma Son         childhood asthma    Clotting disorder Neg Hx     Anesthesia problems Neg Hx        Social History     Tobacco Use    Smoking status: Never    Smokeless tobacco: Never   Substance Use Topics    Alcohol use: No     Alcohol/week: 0.0 standard drinks of alcohol    Drug use: No       Allergies  Reviewed on 01/26/2023        Reactions Comments    Gentamicin Other (See Comments) BALANCE ISSUES    Tobramycin Other (See Comments) ototoxicity    Mirtazapine Other (See Comments) Lower extremity swelling    Morphine Nausea And Vomiting           Current Outpatient Medications   Medication Sig Dispense Refill    albuterol 2.5 mg /3 mL (0.083 %) nebulizer solution Inhale 3 mL by nebulization Three (3) times a day. 810 mL 3    albuterol HFA 90 mcg/actuation inhaler Inhale 2 puffs four (4) times a day as needed.      alcohol swabs (ALCOHOL PREP PADS) PadM Use as directed with inhaled antibiotics 100 each 5    ascorbic acid, vitamin C, (VITAMIN C) 1000 MG tablet Take 1 tablet (1,000 mg total) by mouth daily.      atorvastatin (LIPITOR) 10 MG tablet Take 1 tablet (10 mg total) by mouth daily.      azithromycin (ZITHROMAX) 250 MG tablet TAKE 1 TABLET BY MOUTH EVERY DAY 90 tablet 3    calcium carbonate (CALCIUM 600 ORAL) Take 2 tablets by mouth two (2) times a day. 1 in morning and 1 in the evening      cholecalciferol, vitamin D3-50 mcg, 2,000 unit,, 50 mcg (2,000 unit) tablet Take 1 tablet (50 mcg total) by mouth daily.  colistimethate (COLYMYCIN) 150 mg injection Inject 2 mL sterile water for injection to mix colistin vial, then draw up 2 mL (150mg ) and inhale 2 times a day, 28 days on and 28 days off. 60 each 5    dilTIAZem (CARDIZEM CD) 120 MG 24 hr capsule Take 1 capsule (120 mg total) by mouth daily. 90 capsule 3    dornase alfa (PULMOZYME) 1 mg/mL nebulizer solution Inhale 1 ampule (2.5 mg) daily. Use at least 30-60 minutes before airway clearance, or after airway clearance 75 mL 11    empty container (SHARPS CONTAINER) Misc USE AS DIRECTED 1 each 0    empty container Misc USE AS DIRECTED 1 each 2    empty container Misc Use as directed to dispose of needles. When full, make sure lid is closed tightly then dispose of container in trash. 1 each 2    ferrous sulfate 325 (65 FE) MG tablet Take 1 tablet (325 mg total) by mouth Two (2) times a day. BID      fluticasone furoate-vilanterol (BREO ELLIPTA) 100-25 mcg/dose inhaler Inhale 1 puff daily. 1 puff daily 60 each 12    fluticasone propionate (FLONASE) 50 mcg/actuation nasal spray 1 spray into each nostril daily.      HYDROcodone-acetaminophen (NORCO) 7.5-325 mg per tablet Take 1 tablet by mouth three (3) times a day (at 6am, noon and 6pm).      montelukast (SINGULAIR) 10 mg tablet TAKE 1 TABLET BY MOUTH EVERY DAY AT NIGHT 90 tablet 3    multivitamin-minerals-lutein Tab Take 1 tablet by mouth daily.      nebulizers (LC PLUS) Misc Use with inhaled medications 1 each 5    nebulizers Misc use with nebulized medications 12 each 0    needle, disp, 21 G (BD REGULAR BEVEL NEEDLES) 21 gauge x 1 1/2 Ndle Use as directed with inhaled Colistin 100 each 3    NON FORMULARY Take 650 mg by mouth two (2) times a day. Brand: Citracal 650mg       omeprazole (PRILOSEC) 20 MG capsule Take 1 capsule (20 mg total) by mouth Two (2) times a day.      OXYGEN-AIR DELIVERY SYSTEMS MISC Inhale 2 mL.      sodium chloride (BD POSIFLUSH NORMAL SALINE 0.9) 0.9 % injection Inject 2mL of 0.9%NaCl into colistin vial & gently mix. After withdrawing colistin dose, add an additional 1mL of 0.9%NaCl to neb cup with the colistin dose. 180 mL 11    sodium chloride 10 % Nebu Measure and Inhale 5 mL by nebulization Two (2) times a day. Discard remaining amount and use a new vial for each dose. 900 mL 3    sterile water Soln Use 2mL to mix Colistin, then add additional 2mL to neb cup with 2mL of mixed colistin. Inhale twice daily 28 days on and 28 days off. 600 mL 5    syringe with needle (BD LUER-LOK SYRINGE) 3 mL 20 gauge x 1 1/2 Syrg For use with inhaled antibiotic (Colistin) 60 each 30    syringe with needle (BD LUER-LOK SYRINGE) 3 mL 21 gauge x 1 Syrg For use with inhaled antibiotic (Colistin) 60 each 5    tobramycin, PF, ([name]) 300 mg/5 mL nebulizer solution Inhale the contents of 1 ampule (300 mg total) by nebulization every twelve (12) hours. 28 days on and 28 days off. 280 mL 5    zinc gluconate 50 mg (7 mg elemental zinc) tablet Take 1 tablet (50 mg total) by mouth.  zoledronic acid-mannitol&water (RECLAST) 5 mg/100 mL PgBk Infusion       No current facility-administered medications for this visit.     Physical Exam:   BP 134/74 (BP Site: L Arm, BP Position: Sitting, BP Cuff Size: Small)  - Pulse 80  - Ht 170.5 cm (5' 7.13)  - Wt 78.5 kg (173 lb)  - SpO2 94%  - BMI 26.99 kg/m??   Significantly fatigued but non-toxic appearing white male. Easy work of breathing without accessory muscle use. Bronchial breath sounds over upper lobes with decreased air movement over the right upper lobe as compared to left.  Crackles over mid to lower lung fields, increased from what I recall during his hospitalization.  No expiratory wheezes. Speaking in full sentences.  Regular rate and rhythm with normal S1 and S2.  No murmurs, rubs, or gallops.  Clubbing unchanged from prior.    Diagnostic Review:   The following data were reviewed during this visit with key findings summarized below:  Pulmonary Function Testing:  Spirometry consistent with moderate airway obstruction and suggestive of moderate restriction.  Measures are stable from prior. Normal inspiratory loop.    FVC (% predicted) FEV1 (% predicted) FEV1/FVC   11/10/2013 3.19 L (68%) 2.16 L (61%)  68%   02/23/2014 3.50 L (76%)  2.23 L (64%)  64%   07/12/2014  2.94 L (66%)  2.02 L (58%)  69%   01/15/2015  2.82 L (62%)  1.79 L (52%)  63%   02/08/2015  3.04 L (66%)  2.06 L (59%)  68%   04/02/2015  2.86 L (63%)  1.83 L (53%)  64%   12/10/2015  2.77 L (62%)  1.62 L (48%)  58%   01/27/2016  2.42 L (54%)  1.55 L (46%)  64%   03/24/2016  3.36 L (75%)  1.99 L (59%)  59%   07/21/2016  2.56 L (57%)  1.54 L (46%)  60%   12/29/2016  2.80 L (63%)  1.57 L (47%)  56%   04/06/2017  2.61 L (59%)  1.46 L (44%)  56%   07/06/2017  2.56 L (58%)  1.49 L (45%)  58%   10/12/2017  2.63 L (59%)  1.47 L (44%)  56%   01/18/2018  2.68 L (63%)  1.36 L (42%)  51%   05/17/2018 2.49 L (57%)  1.25 L (38%)  50%   09/26/2018 2.43 L (57%)  1.26 L (39%)  52%   01/26/2019 2.16 L (51%)  1.16 L (36%)  54%   06/06/2019 2.47 L (59%) 1.11 L (34%) 45%   11/10/19 2.14 L (50%) 1.20 L (37%) 56%   02/13/20 2.01 L (48%) 1.08 L (34%) 54%   05/02/20 2.29 L (54%) 1.11 L (34%) 48%   07/02/20 (pre) 2.11 L (51%) 1.17 L (37%) 56%   07/02/20 (post) 2.26 L (54%) [+7.2%] 1.22 L (38%) [+4.0%] 54%   10/31/20 2.46 L (59%) 1.17 L (37%) 48%   07/01/21 2.33 L (56%) 1.13 L (36%) 48%   09/29/21 1.92 L (47%) 1.01 L (32%) 53%   11/21/21 2.10 L (51%) 1.03 L (33%) 49%   04/14/22 2.09 L (54%) 1.05 L (35%) 50%   08/04/22 1.98 L (51%) 1.00 L (34%) 50%   10/23/22 2.07 L (53%) 0.95 L (32%) 46%   11/17/22 2.04 L (53%) 1.04 L (35%) 51%   01/26/23 1.97 L (54%) 0.97 L (35%) 50%     6-minute Walk (12/06/19): Heart Rate: Resting Heart Rate was  71 bpm. Maximum Heart Rate achieved 97 bpm. Patient did reach target heart rate (85-132 bpm).  Heart rate returned to 77 bpm after 2 minutes of recovery.  Oxygen Saturation: Resting O2 Saturation was 94% on RA. O2 saturation nadir was 87% at minute 2. O2 saturation improved to 96% with 2L via [place] at minute 3 and maintained saturations above 88% for remainder of walk. O2 saturation returned to 96% on room air after 2 minutes of recovery.  Distance: Total 6 minute walk distance was 320 m. Predicted 6 minute walk distance 548.9 m (LLN of 395.8 m). Percent of predicted 6 minute walk distance of 58%. Testing included one 5-minute pause. Distance obtained after placed on 2L and walked for an additional 6 minutes.  BORG: Resting BORG score was 1. Maximum BORG score was 5 at minute 2. Concluding BORG score was 0 after 2 minutes of recovery. Impression: Significant exercise induced oxygen desaturation (below 88%). Exertional hypoxemia improved with 2L supplemental oxygen. Walk distance is less than expected suggesting a cardio-respiratory defect -- clinical correlation suggested.  Recommendation: Supplemental oxygen required with exercise at 2L [place]. Consider further evaluation with spirometry, lung volumes, respiratory muscle flow/force measurements, cardiopulmonary stress testing, and/or cardio/vascular studies.    6-Minute Walk (02/15/19): Resting Heart Rate was 91. Maximum Heart Rate achieved 119. Patient did reach target heart rate (86-133). Resting O2 Saturation was 98% on RA. Resting BORG score was 1. O2 saturation nadir was 91% at minute 6. Total 6 minute walk distance was 381 meters. Predicted 6 minute walk distance of 558 m (LLN of 479m). Percent of predicted 6 minute walk distance of 68%. Heart rate returned to 96 bpm after 2 minutes of recovery. O2 saturation returned to 96% on RA after 2 minutes of recovery. Maximum BORG score was 7 at minute 6. Concluding BORG score was 2 after 2 minutes of recovery. No evidence of resting or exercise induced hypoxemia. No supplemental oxygen is required at this time.    6-Minute Walk (01/26/19): Resting Heart Rate was 86. Maximum Heart Rate achieved 97. Patient did reach target heart rate (86-133). Resting O2 Saturation was 99% on RA. Resting BORG score was 1. O2 saturation nadir was 95% at minutes 3 and 6. Total 6 minute walk distance was 350 meters. Predicted 6 minute walk distance of 558 m (LLN of 418m). Percent of predicted 6 minute walk distance of 63%. Heart rate returned to 86 bpm after 2 minutes of recovery. O2 saturation returned to 96% on RA after 2 /minutes of recovery. Maximum BORG score was 6 at minutes 5 and 6. Concluding BORG score was 1 after 2 minutes of recovery. No evidence of resting or exercise induced hypoxemia. No supplemental oxygen is required at this time.    Cultures:    Source Bacterial Culture AFB Smear AFB Culture   04/02/15 Sputum 2+ OPF; 1+ Probably mPsA Negative Negative   07/02/15 Sputum 4+ OPF; 3+ mPsA; 3+ sPsA; 1+ Steno Negative Negative   01/23/16 Sputum 2+ OPF; 1+ mPsA; 1+ sPsA - -   03/24/16 Sputum 3+ OPF; 3+ mPsA; 3+ sPsA Negative Overgrown   07/21/16 Sputum 4+ OPF; 4+ mPsA; 2+ sPsA Negative Negative   12/29/16 Sputum 4+ OPF; 3+ mPsA; 3+ sPsA Negative Actinomadura sp   04/06/17 Sputum 3+ OPF; 4+ mPsA Negative Negative   07/06/17 Sputum 4+ OPF; 3+ mPsA Negative Negative   10/12/17 Sputum 4+ OPF; 3+ mPsA Negative Negative   01/18/18 Sputum OPF Negative Negative 05/17/18 Sputum 4+ OPF; 3+ mPsA - -  09/26/18 Sputum 4+ OPF; 3+ mPsA Negative Negative   01/26/19 Sputum 4+ OPF; 2+ mPsA Negative Negative   02/15/19 Sputum 3+ OPF; 3+ mPsA Negative Negative   06/06/19 Sputum 3+ OPF; 3+ sPsA; 3+ mPsA Negative Negative   11/10/19 Sputum 2+ OPF; 3+ mPsA Negative Negative   02/13/20 Sputum OPF Negative Negative   07/02/20 Sputum 3+ OPF; 1+sPsA; 3+ mPsA negative negative   10/31/20 Sputum 4+ OPF; 2+ sPsA; 4+ mPsA negative negative   11/17/20 Sputum 3+ OPF; 3+ mPsA negative negative   02/21/21 Sputum Few PsA negative negative   02/27/21 Sputum Scant C albicans and light A fumigatus - -   03/18/21 Sputum 2+ OPF; 1+ mPsA negative negative   03/26/21 Sputum 4+ OPF; 3+ mPsA negative negative   09/29/21 Sputum 4+ OPF; 4+ sPsA; 4+ mPsA negative negative   04/14/22 Sputum 4+ OPF; 1+ mPsA negative negative   08/04/22 Sputum OPF - -   10/23/22 Sputum 4+ OPF; 2+ mPsA negative negative   11/07/22 Sputum 2+ OPF; 1+ mPsA; 1+ sPsA negative negative      Aspergillus Ag BAL/serum (04/02/21): 0.55 (@ [facility])    Bronchiectasis Evaluation   IgG with subclasses:   Lab Results   Component Value Date/Time    IGGT 996 04/28/2012 06:21 AM    IGG1 402 04/28/2012 06:21 AM    IGG2 399 04/28/2012 06:21 AM    IGG3 148.0 (H) 04/28/2012 06:21 AM    IGG4 47.7 04/28/2012 06:21 AM     IgA (09/21/11): 283   IgM (09/21/11): 52   IgE: (02/13/20): 16.6  Lab Results   Component Value Date/Time    IGE 16.6 02/13/2020 10:24 AM     Specific Titers: No results found for: TETANUSAB, TETGV, DIPHTERIAAB, DIPGV, S. pneumonia titers  HIV (05/02/20): Non-reactive  Autoimmune (05/02/20): ANA negative   ANCA: No results found for: ANCA, IFA, PR3ELISA, PR3QT, MPO, MPOQT  CBC-D (05/02/20): 6.1>14.8/44.3<156; ANC 4.1; ALC 1.2; AEC 0.17  Lab Results   Component Value Date/Time    WBC 6.7 11/12/2022 06:13 AM    WBC 6.5 11/11/2022 06:17 AM    WBC 6.9 11/10/2022 05:57 AM    WBC 7.9 08/13/2013 03:57 AM    WBC 9.4 08/12/2013 04:21 AM    WBC 11.7 (H) 08/11/2013 04:04 AM    HGB 12.2 (L) 11/12/2022 06:13 AM    HGB 14.8 03/24/2015 05:31 PM    HCT 36.2 (L) 11/12/2022 06:13 AM    HCT 30.8 (L) 08/13/2013 03:57 AM    PLT 154 11/12/2022 06:13 AM    PLT 292 08/13/2013 03:57 AM    NEUTROABS 4.1 11/07/2022 01:04 PM    NEUTROABS 7.8 11/17/2020 02:31 PM    NEUTROABS 3.7 02/13/2020 10:24 AM    NEUTROABS 4.9 06/21/2013 09:31 PM    NEUTROABS 4.6 06/21/2013 08:13 PM    NEUTROABS 4.1 04/26/2012 11:54 PM    LYMPHSABS 1.7 11/07/2022 01:04 PM    LYMPHSABS 1.4 11/17/2020 02:31 PM    LYMPHSABS 1.4 02/13/2020 10:24 AM    LYMPHSABS 1.9 06/21/2013 09:31 PM    LYMPHSABS 1.8 06/21/2013 08:13 PM    LYMPHSABS 2.8 04/26/2012 11:54 PM    EOSABS 0.5 11/07/2022 01:04 PM    EOSABS 0.2 11/17/2020 02:31 PM    EOSABS 0.2 02/13/2020 10:24 AM    EOSABS 0.2 06/21/2013 09:31 PM    EOSABS 0.2 06/21/2013 08:13 PM    EOSABS 0.3 04/26/2012 11:54 PM    BASOSABS 0.1 11/07/2022 01:04 PM    BASOSABS  0.0 06/21/2013 09:31 PM     Alpha-1 level: No results found for: A1TRYP  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      IgE (05/23/18):         Imaging  Chest CT (06/15/22): Images personally reviewed.  Minimal change from prior CT though there is a slightly smaller focus of soft tissue/consolidation, possibly reflective of mycetoma, within complex cavitary lesion in the apex of the right lung.  New small focus of endobronchial debris within the cystic airspaces of the lingula.    Chest CT (11/14/21): Images personally reviewed. I agree with radiology interpretation that he continues to have extensive cavitary consolidation in right apex with areas of dependent soft tissue within medial component of cavity; findings unchanged and c/w mycetoma. Ill-defined nodular consolidation in right posterior lung. S/p resection of right lower lobe. Unchanged extensive bronchiectasis. Mosaic attenuation c/w gas trapping 2/2 small airways disease.     CTA Chest (04/15/21): Images personally reviewed. Sequelae of right middle lobectomy and right lower lobe wedge resection. Chronic consolidation in the medial posterior right upper lobe and right lung apex with volume loss and architectural distortion of the right upper lobe. Compensatory hyperexpansion of the left lung with mosaic attenuation consistent with areas of air trapping. Extensive chronic bilateral bronchiectasis and bronchial thickening. Unchanged diffuse bilateral tree-in-bud nodularity.  Mildly enlarged and tortuous right bronchial artery, predominantly supplying the right upper lung, arising from the lateral descending aorta at the level of T6 (7:58, 4:38). No enlarged left-sided bronchial arteries. No CT evidence of intrapulmonary arteriovenous malformation.     Chest CT (02/25/21): Images personally reviewed.  Stable sequelae of right middle lobectomy and right lower lobe wedge resection. 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 Stable bilateral diffuse extensive bronchiectasis, bronchial wall thickening, and sequelae of chronic endobronchial infection.        CXR (05/02/20): Images not available for review. Per report, Visualized cardiac and mediastinal contours within normal limits allowing for mediastinal shift to the right. Circumferential right pleural fluid/thickening. Volume loss in the right lung consistent with known right lung resection. Irregular opacities throughout both lungs, right greater than left, consistent with known bronchiectasis.     Chest CT (11/10/19): Images personally reviewed. Stable sequelae of right middle lobectomy and right lower lobe wedge resection. Stable consolidation in the medial posterior right upper lobe and right lung apex, bilateral diffuse extensive bronchiectasis and bronchial wall thickening and unchanged sequelae of chronic endobronchial infection.    Chest CT (02/15/19): Images personally reviewed. Stable sequelae of right middle lobectomy and right lower lobe wedge resection.  Unchanged diffuse extensive bronchiectasis and bronchial wall thickening bilaterally.  Diffusely scattered tree-in-bud nodules in bilateral lung parenchyma with interval increase in tree-in-bud nodules in left lung; the constellation of these findings are consistent with sequelae of worsening chronic endobronchial infection.    Chest CT (03/31/16): Images personally reviewed. Status post right middle and right lower lobectomies. Tiny loculated basilar right hydropneumothorax, probably chronic. Moderate cylindrical and varicoid bronchiectasis throughout both lungs, with associated scattered mucoid  impaction, tree-in-bud opacities and scattered peribronchovascular and subpleural consolidation in the right upper lobe. 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 Two-vessel coronary atherosclerosis. Nonobstructing right nephrolithiasis.    Modified Barium Swallow (01/17/19): Shallow laryngeal penetration observed with thin liquids and puree.  No aspiration identified with any of the tested consistencies.    Immunization History   Administered Date(s) Administered    COVID-19 VAC,BIVALENT(36YR UP),PFIZER 12/17/2020    COVID-19 VAC,MRNA,TRIS(12Y UP)(PFIZER)(GRAY CAP) 12/01/2019, 05/03/2020, 08/13/2022    COVID-19 VACC,MRNA,(PFIZER)(PF) 04/20/2019, 05/13/2019, 05/18/2019, 12/01/2019    Covid-19 Vac, (70yr+) (Comirnaty) Mrna Pfizer  01/07/2022, 08/13/2022, 12/22/2022    HEPATITIS B VACCINE ADULT,IM(ENERGIX B, RECOMBIVAX) 05/13/2020, 06/11/2020, 11/13/2020    Hepatitis A (Adult) 05/13/2020, 11/13/2020    INFLUENZA IIV3 HIGH DOSE 69YRS+(FLUZONE) 12/22/2022    INFLUENZA QUAD ADJUVANTED 65YR UP(FLUAD) 11/25/2021    INFLUENZA TIV (TRI) 87MO+ W/ PRESERV (IM) 11/13/2010, 01/07/2012, 12/09/2017, 11/30/2019, 12/17/2020    INFLUENZA TIV (TRI) PF (IM)(HISTORICAL) 02/20/2012, 12/09/2012    Influenza Vaccine Quad(IM)6 MO-Adult(PF) 01/31/2016, 12/09/2017    Influenza Virus Vaccine, unspecified formulation 12/25/2014, 12/01/2016    Influenza Whole 12/21/2008    PNEUMOCOCCAL POLYSACCHARIDE 23-VALENT 10/22/2010, 02/07/2016, 10/07/2019    Pneumococcal Conjugate 13-Valent 04/28/2013    RSV VACCINE,ADJUVANTED(PF)(77YRS+)(AREXVY) 01/07/2022    TdaP 03/22/2012, 07/23/2021    ZOSTAVAX - ZOSTER VACCINE, LIVE, SQ 07/03/2014, 06/11/2017, 01/19/2018     Portions of this record have been created using Dragon dictation software. Dictation errors have been sought, but may not have been identified and corrected.

## 2023-01-26 NOTE — Unmapped (Signed)
PULMONARY CONSULT  NOTE      Patient: Jordan [name](26-Jun-1955)  Reason for consultation: Mr. Plett is a 67 y.o. male who is seen in consultation at the request of Rocco Serene, MD for comprehensive evaluation of acute exacerbation of bronchiectasis.    Assessment and Recommendations:      Active Problems:    Bronchiectasis (CMS-HCC)    Pseudomonas aeruginosa infection    Essential hypertension    Pulmonary aspergilloma (CMS-HCC)    Acute exacerbation of bronchiectasis (CMS-HCC)       Antibiotics:  Based on prior culture data (OPF; PsA) and allergy profile, favor treating with IV Zosyn, oral Cipro, and inhaled Colistin.  Would not place PICC line just yet as  he may simply need more aggressive airway clearance and prednisone to turn the corner.  Could then discharged on 14 days of Cipro and continue his 28 days of colistin.    If he feels that he needs the IV antibiotics, would treat for at least 2 weeks with all 3 antibiotics.  Continue chronic azithromycin for anti-inflammatory effect.     Other medications:  While in hospital, substitute nebulized budesonide 0.5 mg bid and arformoterol bid for Symbicort and Spiriva.  Prednisone 40 mg daily x 5 days starting tomorrow AM.     Airway clearance: Needs aggressive airway clearance 4 times per day.  Nebulized hypertonic saline 10%; pre-treatment with duonebs.  For mechanical clearance, prefer MetaNeb and Vibra lung.  Continue Pulmozyme nebs once a day       Labs:  CBC-D, CMP, CRP, ESR, sputum for bacterial and AFB cultures (NTM screening) sent from from clinic.  Weekly CRP and ESR to trend (including as outpatient).  [facility] Guideline for Monitoring Outpatient Intravenous and Oral Antimicrobial Therapy recommended monitoring labs ([email or web address]          Other Testing:  Patient does not need PFTs performed while in the hospital.     [facility]):  Patient is not a candidate for [facility] due to distance.     Dispo:   Patient has a follow-up appointment with me scheduled for 04/20/23.  Patient does not need to remain in the hospital for the entire course of IV antibiotics if elects to continue two weeks of Zosyn.   If he is discharging on IV Zosyn, please reach out to [facility] Bronchiectasis/NTM Care and [facility] team (myself, Alvino Blood RN, Eliberto Ivory CPP) prior to discharge to ensure that home infusion orders are correct and that we are comfortable with patient discharging home on IVs at that time.  We will follow monitoring labs.  Please use DOTPULMIVDISCHARGE for home infusion orders. We will follow monitoring labs. Does not need OPAT.  Do not discharge Friday through Sunday or on a holiday as no outpatient clinical staff available to assist if problems arise with home health/home infusion.        We appreciate the opportunity to assist in the care of this patient.  Please page [phone] with any questions.    Verneita Griffes, MD    Subjective:      History of Present Illness:  Mr. Starzyk is a 67 y.o. male admitted for bronchiectasis exacerbation.  Presented to clinic on 11/5 with worsening SOB over the past few days and was directly admitted for tx.      Review of Systems: A comprehensive review of systems was performed and was negative except as above in HPI  Past Medical History:  Diagnosis Date    Abscess of lung (CMS-HCC) 11/06/2010    CT Chest 10/22/10 12/05/2010 right thoracotomy resection of right middle lobe and resection of right lower lobe abscess     Arthritis     back    Biceps tendon tear 2013    left side    Bronchiectasis (CMS-HCC)     chronic psuedomonas infection    Degenerative joint disease of left acromioclavicular joint     GERD (gastroesophageal reflux disease)     Hypertension     Obstructive sleep apnea on CPAP 03/14/13    AHI 33.5, on BiPAP 12/8 based on sleep study 09/2013    Pneumonia 2012    Lung abcess     PONV (postoperative nausea and vomiting)     Prostate cancer (CMS-HCC) 09/09/2017    Rotator cuff injury left    Vertigo      Past Surgical History:   Procedure Laterality Date    APPENDECTOMY  1969    BACK SURGERY      BRONCHOSCOPY  10/19/2010    [facility]    CARPAL TUNNEL RELEASE  1999    IR EMBOLIZATION HEMORRHAGE ART OR VEN  LYMPHATIC EXTRAVASATION  07/11/2021    IR EMBOLIZATION HEMORRHAGE ART OR VEN  LYMPHATIC EXTRAVASATION 07/11/2021 Braulio Conte, MD IMG VIR [facility]    LUNG REMOVAL, PARTIAL Right 10/2010    RML and RLL partially resected    MENISCECTOMY  2011    NECK SURGERY  1993, 1999, 2002    PR GERD TST W/ MUCOS IMPEDE ELECTROD,>1HR N/A 07/06/2012    Procedure: ESOPHAGEAL FUNCTION TEST, GASTROESOPHAGEAL REFLUX TEST W/ NASAL CATHETER INTRALUMINAL IMPEDANCE ELECTRODE(S) PLACEMENT, RECORDING, ANALYSIS AND INTERPRETATION; PROLONGED;  Surgeon: None None;  [facility];  Service: Gastroenterology    PR OPEN TREAT RIB FRACTURE W/INT FIXATION, UNILATERAL, 1-2 RIBS Right 03/19/2015    Procedure: OPEN TREATMENT OF RIB FRACTURE REQUIRING INTERNAL FIXATION, UNILATERAL; 1-2 RIBS;  Surgeon: Evert Kohl, MD;  Location: MAIN OR [facility];  Service: Cardiothoracic    PR THORACOTOMY W/THERAP WEDGE RESEXN ADDL IPSILATRL Right 08/07/2013    Procedure: THORACOTOMY; WITH THERAPEUTIC LOBECTOMY OF RIGHT MIDDLE AND RIGHT LOWER LOBE RESECTION , EACH ADDITIONAL RESECTION, IPSILATERAL;  Surgeon: Alvester Chou, MD;  Location: MAIN OR [facility];  Service: Cardiothoracic    TONSILLECTOMY  1965     Medications reviewed in Epic  Allergies as of 01/26/2023 - Reviewed 01/26/2023   Allergen Reaction Noted    Gentamicin Other (See Comments) 07/28/2013    Tobramycin Other (See Comments) 11/18/2012    Mirtazapine Other (See Comments) 04/14/2022    Morphine Nausea And Vomiting 07/05/2012     Family History   Problem Relation Age of Onset    Alzheimer's disease Mother     Stroke Mother     Bronchiectasis  Brother         presumed    Bronchiectasis  Brother         presumed    Liver disease Father     Diabetes Father     Heart failure Father     Bronchiectasis  Daughter     Asthma Son         childhood asthma    Clotting disorder Neg Hx     Anesthesia problems Neg Hx      Social History     Tobacco Use    Smoking status: Never    Smokeless tobacco: Never   Substance Use Topics  Alcohol use: No     Alcohol/week: 0.0 standard drinks of alcohol        Objective:      Physical Exam:  Vitals:    01/26/23 1324   BP: 127/72   Pulse: 81   Resp: 16   Temp: 36.8 ??C (98.2 ??F)   TempSrc: Temporal   SpO2: 94%   Weight: 78.2 kg (172 lb 6.4 oz)   Height: 170.2 cm (5' 7)     General appearance - well appearing, well nourished, and in no distress  Heart - regular rate and rhythm, normal S1/S2, no gallops, rubs, or murmurs  Chest -absent breath sounds over the right middle and lower lobe, corresponding with prior resection. Reduced air movement globally.   Easy work of breathing without accessory muscle use.  Speaking in full sentences without difficulty.  Abdomen - +BS  Extremities - Warm, well perfused Clubbing of fingers.  No cyanosis.  Skin - normal coloration  Neurological - alert, oriented, normal speech, no focal findings or movement disorder noted  Psych:  CAM -    Malnutrition Assessment by RD:          Diagnostic Review:   All labs and images were personally reviewed.

## 2023-01-27 DIAGNOSIS — J471 Bronchiectasis with (acute) exacerbation: Secondary | ICD-10-CM | POA: Diagnosis not present

## 2023-01-27 LAB — LIPID PANEL
CHOLESTEROL/HDL RATIO SCREEN: 3.1 (ref 1.0–4.5)
CHOLESTEROL: 137 mg/dL (ref <=200)
HDL CHOLESTEROL: 44 mg/dL (ref 40–60)
LDL CHOLESTEROL CALCULATED: 76 mg/dL (ref 40–99)
NON-HDL CHOLESTEROL: 93 mg/dL (ref 70–130)
TRIGLYCERIDES: 86 mg/dL (ref 0–150)
VLDL CHOLESTEROL CAL: 17.2 mg/dL (ref 12–42)

## 2023-01-27 LAB — CBC W/ AUTO DIFF
BASOPHILS ABSOLUTE COUNT: 0 10*9/L (ref 0.0–0.1)
BASOPHILS RELATIVE PERCENT: 0.2 %
EOSINOPHILS ABSOLUTE COUNT: 0 10*9/L (ref 0.0–0.5)
EOSINOPHILS RELATIVE PERCENT: 0.1 %
HEMATOCRIT: 38.8 % — ABNORMAL LOW (ref 39.0–48.0)
HEMOGLOBIN: 13.1 g/dL (ref 12.9–16.5)
LYMPHOCYTES ABSOLUTE COUNT: 1.1 10*9/L (ref 1.1–3.6)
LYMPHOCYTES RELATIVE PERCENT: 12.2 %
MEAN CORPUSCULAR HEMOGLOBIN CONC: 33.7 g/dL (ref 32.0–36.0)
MEAN CORPUSCULAR HEMOGLOBIN: 30.8 pg (ref 25.9–32.4)
MEAN CORPUSCULAR VOLUME: 91.3 fL (ref 77.6–95.7)
MEAN PLATELET VOLUME: 8.4 fL (ref 6.8–10.7)
MONOCYTES ABSOLUTE COUNT: 0.6 10*9/L (ref 0.3–0.8)
MONOCYTES RELATIVE PERCENT: 6.6 %
NEUTROPHILS ABSOLUTE COUNT: 7.2 10*9/L (ref 1.8–7.8)
NEUTROPHILS RELATIVE PERCENT: 80.9 %
NUCLEATED RED BLOOD CELLS: 0 /100{WBCs} (ref <=4)
PLATELET COUNT: 172 10*9/L (ref 150–450)
RED BLOOD CELL COUNT: 4.25 10*12/L — ABNORMAL LOW (ref 4.26–5.60)
RED CELL DISTRIBUTION WIDTH: 12.7 % (ref 12.2–15.2)
WBC ADJUSTED: 8.9 10*9/L (ref 3.6–11.2)

## 2023-01-27 LAB — BASIC METABOLIC PANEL
ANION GAP: 6 mmol/L (ref 5–14)
BLOOD UREA NITROGEN: 19 mg/dL (ref 9–23)
BUN / CREAT RATIO: 19
CALCIUM: 9.3 mg/dL (ref 8.7–10.4)
CHLORIDE: 108 mmol/L — ABNORMAL HIGH (ref 98–107)
CO2: 26.3 mmol/L (ref 20.0–31.0)
CREATININE: 0.98 mg/dL
EGFR CKD-EPI (2021) MALE: 84 mL/min/{1.73_m2} (ref >=60)
GLUCOSE RANDOM: 107 mg/dL — ABNORMAL HIGH (ref 70–99)
POTASSIUM: 4.1 mmol/L (ref 3.4–4.8)
SODIUM: 140 mmol/L (ref 135–145)

## 2023-01-27 LAB — C-REACTIVE PROTEIN: C-REACTIVE PROTEIN: 4 mg/L (ref <=10.0)

## 2023-01-27 LAB — SEDIMENTATION RATE: ERYTHROCYTE SEDIMENTATION RATE: 25 mm/h — ABNORMAL HIGH (ref 0–20)

## 2023-01-27 MED ADMIN — atorvastatin (LIPITOR) tablet 10 mg: 10 mg | ORAL | @ 13:00:00

## 2023-01-27 MED ADMIN — ascorbic acid (vitamin C) (VITAMIN C) tablet 1,000 mg: 1000 mg | ORAL | @ 13:00:00

## 2023-01-27 MED ADMIN — cefTAZidime (FORTAZ) 2 g in sodium chloride 0.9 % (NS) 100 mL IVPB-MBP: 2 g | INTRAVENOUS | @ 11:00:00 | Stop: 2023-01-27

## 2023-01-27 MED ADMIN — sodium chloride 10 % NEBULIZER solution 5 mL: 5 mL | RESPIRATORY_TRACT | @ 22:00:00

## 2023-01-27 MED ADMIN — piperacillin-tazobactam (ZOSYN) IVPB (premix) 4.5 g: 4.5 g | INTRAVENOUS | @ 22:00:00 | Stop: 2023-02-17

## 2023-01-27 MED ADMIN — pantoprazole (Protonix) EC tablet 40 mg: 40 mg | ORAL | @ 13:00:00

## 2023-01-27 MED ADMIN — ipratropium-albuterol (DUO-NEB) 0.5-2.5 mg/3 mL nebulizer solution 3 mL: 3 mL | RESPIRATORY_TRACT | @ 09:00:00 | Stop: 2023-01-27

## 2023-01-27 MED ADMIN — oxyCODONE (ROXICODONE) immediate release tablet 7.5 mg: 7.5 mg | ORAL | @ 02:00:00 | Stop: 2023-02-09

## 2023-01-27 MED ADMIN — budesonide (PULMICORT) nebulizer solution 0.5 mg: .5 mg | RESPIRATORY_TRACT | @ 15:00:00

## 2023-01-27 MED ADMIN — ipratropium-albuterol (DUO-NEB) 0.5-2.5 mg/3 mL nebulizer solution 3 mL: 3 mL | RESPIRATORY_TRACT | @ 15:00:00

## 2023-01-27 MED ADMIN — ipratropium-albuterol (DUO-NEB) 0.5-2.5 mg/3 mL nebulizer solution 3 mL: 3 mL | RESPIRATORY_TRACT | @ 02:00:00

## 2023-01-27 MED ADMIN — ciprofloxacin HCl (CIPRO) tablet 500 mg: 500 mg | ORAL | @ 14:00:00 | Stop: 2023-02-17

## 2023-01-27 MED ADMIN — oxyCODONE (ROXICODONE) immediate release tablet 7.5 mg: 7.5 mg | ORAL | @ 11:00:00 | Stop: 2023-02-09

## 2023-01-27 MED ADMIN — oxyCODONE (ROXICODONE) immediate release tablet 7.5 mg: 7.5 mg | ORAL | @ 20:00:00 | Stop: 2023-02-09

## 2023-01-27 MED ADMIN — ipratropium-albuterol (DUO-NEB) 0.5-2.5 mg/3 mL nebulizer solution 3 mL: 3 mL | RESPIRATORY_TRACT | @ 22:00:00

## 2023-01-27 MED ADMIN — sodium chloride 10 % NEBULIZER solution 5 mL: 5 mL | RESPIRATORY_TRACT | @ 16:00:00

## 2023-01-27 MED ADMIN — azithromycin (ZITHROMAX) tablet 250 mg: 250 mg | ORAL | @ 13:00:00 | Stop: 2023-02-09

## 2023-01-27 MED ADMIN — cholecalciferol (vitamin D3-10 mcg (400 unit)) tablet 50 mcg: 50 ug | ORAL | @ 13:00:00

## 2023-01-27 MED ADMIN — arformoterol (BROVANA) nebulizer solution 15 mcg/2 mL: 15 ug | RESPIRATORY_TRACT | @ 15:00:00

## 2023-01-27 MED ADMIN — predniSONE (DELTASONE) tablet 40 mg: 40 mg | ORAL | @ 13:00:00 | Stop: 2023-01-31

## 2023-01-27 MED ADMIN — piperacillin-tazobactam (ZOSYN) IVPB (premix) 4.5 g: 4.5 g | INTRAVENOUS | @ 17:00:00 | Stop: 2023-02-17

## 2023-01-27 MED ADMIN — ciprofloxacin HCl (CIPRO) tablet 500 mg: 500 mg | ORAL | @ 22:00:00 | Stop: 2023-02-17

## 2023-01-27 MED ADMIN — cefTAZidime (FORTAZ) 2 g in sodium chloride 0.9 % (NS) 100 mL IVPB-MBP: 2 g | INTRAVENOUS | @ 02:00:00 | Stop: 2023-02-02

## 2023-01-27 MED ADMIN — dornase alfa (PULMOZYME) 1 mg/mL solution 2.5 mg: 2.5 mg | RESPIRATORY_TRACT | @ 15:00:00

## 2023-01-27 MED ADMIN — dilTIAZem (CARDIZEM CD) 24 hr capsule 120 mg: 120 mg | ORAL | @ 13:00:00

## 2023-01-27 MED ADMIN — tobramycin (PF) (TOBI) 300 mg/5 mL nebulizer solution 300 mg: 300 mg | RESPIRATORY_TRACT | @ 02:00:00 | Stop: 2023-02-09

## 2023-01-27 MED ADMIN — sodium chloride 7% NEBULIZER solution 4 mL: 4 mL | RESPIRATORY_TRACT | @ 02:00:00

## 2023-01-27 NOTE — Unmapped (Signed)
Pt has been resting quietly overnight. Remains on IV abx. O2 in place at 2L [place], sats WNL. PRN oxycodone given for pt c/o chronic low back pain, no other complaints or concerns voiced. Will cont to monitor.    6CIT Score for Cognitive Screening:  Complete for all new admissions, scoring found on the back of the Kardex  If a patient has known dementia, no need to complete the 6CIT  [] Patient has dementia  [] Normal 6CIT (<8)    [] Significant 6CIT (8 or higher)  [] Needs to be completed    CAM Delirium Screening Tool  Be sure to ask questions to gauge orientation and attention during your assessment  Feature Present? Scoring: CAM Positive if:   Acute onset (different from baseline) with Fluctuating course [] Yes    [x] No   Yes to BOTH of these AND.Marland KitchenMarland Kitchen   Inattention (days of the week or months of the year backward) [] Yes    [x] No    Disorganized thinking [] Yes    [x] No   Yes to at least   ONE of these   Altered level of consciousness  [] Yes    [x] No    CAM []  Positive  [x] Negative    CAM-S: Symptom Severity Score  Complete on all patients >=30 years old, including in patients with dementia  Do not complete if the patient is known to be non-verbal due to their dementia  Feature  Severity Score  Question   ACUTE ONSET  & FLUCTUATING  COURSE [x] No (0)    [] Yes (1)    Is there evidence of an acute change in mental status from the patient's baseline? Did the patient's behavior fluctuate at any point during the interview for any of the 10 features?   2. INATTENTION [x] No (0)    [] Yes, mild (1)  [] Yes, marked (2)  Did the patient have difficulty focusing attention, for example being easily distractible, or having difficulty keeping track of what was being said?   3. DISORGANIZED  THINKING [x] No (0)    [] Yes, mild (1)  [] Yes, marked (2)  Was the patient's thinking disorganized or incoherent, such as rambling or irrelevant conversation, unclear or illogical flow or of ideas, unpredictable switching from subject to subject?   4. ALTERED LEVEL  OF  CONSCIOUSNESS [x] Normal (0)    [] Mild: vigilant or lethargic (1)  [] Marked: stupor or coma (2)    Overall, how would you rate the patient's level of consciousness?   -Alert (normal)   -Vigilant  -Lethargic  -Stupor  -Coma  -Uncertain   5. DISORIENTATION [x] No (0)    [] Yes, mild (1)  [] Yes, marked (2)  Was the patient disoriented at any time during the interview, such as thinking he/she was somewhere other than the hospital, using the wrong bed, or misjudging the time of day?   6. MEMORY  IMPAIRMENT [x] No (0)    [] Yes, mild (1)  [] Yes, marked (2)    Did the patient demonstrate any memory problems during the interview, such as inability to remember events in the hospital or difficulty remembering instructions?   7. PERCEPTUAL  DISTURBANCES [x] No (0)    [] Yes, mild (1)  [] Yes, marked (2)  Did the patient have any evidence of perceptual disturbances, for example, hallucinations, illusions, or misinterpretations (such as thinking something was moving when it was not)?   8. PSYCHOMOTOR  AGITATION [x] No (0)    [] Yes, mild (1)  [] Yes, marked (2)  At any time during the interview, did the patient have an unusually  increased level of motor activity, such as restlessness, picking at bedclothes, tapping fingers, or making frequent sudden changes of position?   9. PSYCHOMOTOR  RETARDATION [x] No (0)    [] Yes, mild (1)  [] Yes, marked (2)  At any time during the interview, did the patient have an unusually decreased level of motor activity, such as sluggishness, staring into space, staying in one position for a long time, or moving very slowly?   10. ALTERED  SLEEP-WAKE  CYCLE  [x] No (0)    [] Yes, mild (1)  [] Yes, marked (2)  Did the patient have evidence of disturbance of the sleep-wake cycle, such as excessive daytime sleepiness with insomnia at   night?   Long Form   SEVERITY SCORE:  Severity Score (add rows 1-10)  Total (0-19): 0    Scoring the CAM-S: Rate each symptom of delirium listed in the CAM instrument as absent (0), mild (1), marked (2). Acute onset or fluctuation is rated as absent (0) or present (1). Summarize these scores into a composite.     Problem: Adult Inpatient Plan of Care  Goal: Plan of Care Review  Outcome: Progressing  Goal: Patient-Specific Goal (Individualized)  Outcome: Progressing  Goal: Absence of Hospital-Acquired Illness or Injury  Outcome: Progressing  Intervention: Identify and Manage Fall Risk  Recent Flowsheet Documentation  Taken 01/26/2023 2000 by Renette Butters, RN ADN  Safety Interventions: fall reduction program maintained  Intervention: Prevent Skin Injury  Recent Flowsheet Documentation  Taken 01/26/2023 2000 by Renette Butters, RN ADN  Positioning for Skin: Sitting in Chair  Goal: Optimal Comfort and Wellbeing  Outcome: Progressing  Goal: Readiness for Transition of Care  Outcome: Progressing  Goal: Rounds/Family Conference  Outcome: Progressing     Problem: Infection  Goal: Absence of Infection Signs and Symptoms  Outcome: Progressing

## 2023-01-27 NOTE — Unmapped (Addendum)
PHYSICAL THERAPY  Evaluation (01/27/23 0900)          Patient Name:  Jordan [name]       Medical Record Number: [id number]   Date of Birth: 1955/08/26  Sex: Male        Post-Discharge Physical Therapy Recommendations:  PT Post Acute Discharge Recommendations: Skilled PT services NOT indicated   Equipment Recommendation  PT DME Recommendations: None             Treatment Diagnosis: 'discharge planning'     Activity Tolerance: Tolerated treatment well     ASSESSMENT  Problem List: Decreased endurance      Assessment : Jordan [name] is a 67 y.o. male who is presenting to [facility] with bronchiectasis exacerbation in setting of chronic hypoxic history failure, chronic bronchiectasis, history of pulmonary aspergillosis, history of pulmonary MDRO Pseudomonas. Patient reports baseline independence with functional mobility at home and in the community without AD, usses 2 L almost continuously and is limited in activity tolerance by exertional dyspnea. He reports self-pacing daily activities, such as taking multiple trips to bring groceries into the house with rest breaks in between trips. He currently presents to PT quite near his functional baseline, able to transfer and ambulate 56ft with independence, needing a seated rest break when he felt exertional dyspnea. SpO2 on 2L during session was 95% at rest, 90% with exertion (when pt reported feeling exertional dyspnea), and 97% after seated rest. Patient negotiated 2 steps x 2 trials without handrails, CGA at first for safety, but then mod I on second trial. [place]etti 25/28 indicating low fall risk. Patient's 6 item raw AMPAC score is 24/24 with patient presenting as 0% impaired with basic functional mobility. Reinforced pt use of activity pacing at home, pt verbalizes understanding. No post acute services indicated. All training completed this session, PT eval only.      Today's Interventions: Endurance activities, Gait training, Therapeutic activity, Patient/Family/Caregiver Education, Balance activities  Today's Interventions: PT eval, transfers and ambulation, stair negotiation, pt ed: activity pacing, pursed lip breathing, POC, dc recs     Personal Factors/Comorbidities Present: 1-2   Examination of Body System: Musculoskeletal, Pulmonary  Clinical Presentation: Evolving    Clinical Decision Making: Low        PLAN  Planned Frequency of Treatment: Plan of Care Initiated: 01/27/23  D/C Services Weekly Frequency: D/C Services  Planned Treatment Duration: 01/27/23     Planned Interventions: Education (Patient/Family/Caregiver), Gait training, Therapeutic Activity, Self-care / Home Management training     Goals:   Patient and Family Goals: to feel better, retain independence with daily activities                                                                                                     Long Term Goal #1: Pt will resume usual community ambulation with ind in 3 weeks  Time Frame: 3 weeks     Prognosis:  Good  Positive Indicators: PLOF, motivation  Barriers to Discharge: None     SUBJECTIVE  Communication Preference: Verbal,     Patient reports:  Agreeable to PT        Prior Functional Status: Patient reports independence with ambulation at home and community, no AD. Paces activity to manage exertional dyspnea. Uses 2 L O2 most of the time, occasionally will take it off for short distance (to bathroom and back) household ambulation.  Equipment available at home: Straight cane, Rolling walker        Past Medical History:   Diagnosis Date    Abscess of lung (CMS-HCC) 11/06/2010    CT Chest 10/22/10 12/05/2010 right thoracotomy resection of right middle lobe and resection of right lower lobe abscess     Arthritis     back    Biceps tendon tear 2013    left side    Bronchiectasis (CMS-HCC)     chronic psuedomonas infection    Degenerative joint disease of left acromioclavicular joint     GERD (gastroesophageal reflux disease)     Hypertension     Obstructive sleep apnea on CPAP 03/14/13 AHI 33.5, on BiPAP 12/8 based on sleep study 09/2013    Pneumonia 2012    Lung abcess     PONV (postoperative nausea and vomiting)     Prostate cancer (CMS-HCC) 09/09/2017    Rotator cuff injury left    Vertigo             Social History     Tobacco Use    Smoking status: Never    Smokeless tobacco: Never   Substance Use Topics    Alcohol use: No     Alcohol/week: 0.0 standard drinks of alcohol       Past Surgical History:   Procedure Laterality Date    APPENDECTOMY  1969    BACK SURGERY      BRONCHOSCOPY  10/19/2010    [facility]    CARPAL TUNNEL RELEASE  1999    IR EMBOLIZATION HEMORRHAGE ART OR VEN  LYMPHATIC EXTRAVASATION  07/11/2021    IR EMBOLIZATION HEMORRHAGE ART OR VEN  LYMPHATIC EXTRAVASATION 07/11/2021 Braulio Conte, MD IMG VIR [facility]    LUNG REMOVAL, PARTIAL Right 10/2010    RML and RLL partially resected    MENISCECTOMY  2011    NECK SURGERY  1993, 1999, 2002    PR GERD TST W/ MUCOS IMPEDE ELECTROD,>1HR N/A 07/06/2012    Procedure: ESOPHAGEAL FUNCTION TEST, GASTROESOPHAGEAL REFLUX TEST W/ NASAL CATHETER INTRALUMINAL IMPEDANCE ELECTRODE(S) PLACEMENT, RECORDING, ANALYSIS AND INTERPRETATION; PROLONGED;  Surgeon: None None;  [facility];  Service: Gastroenterology    PR OPEN TREAT RIB FRACTURE W/INT FIXATION, UNILATERAL, 1-2 RIBS Right 03/19/2015    Procedure: OPEN TREATMENT OF RIB FRACTURE REQUIRING INTERNAL FIXATION, UNILATERAL; 1-2 RIBS;  Surgeon: Evert Kohl, MD;  Location: MAIN OR [facility];  Service: Cardiothoracic    PR THORACOTOMY W/THERAP WEDGE RESEXN ADDL IPSILATRL Right 08/07/2013    Procedure: THORACOTOMY; WITH THERAPEUTIC LOBECTOMY OF RIGHT MIDDLE AND RIGHT LOWER LOBE RESECTION , EACH ADDITIONAL RESECTION, IPSILATERAL;  Surgeon: Alvester Chou, MD;  Location: MAIN OR [facility];  Service: Cardiothoracic    TONSILLECTOMY  1965             Family History   Problem Relation Age of Onset    Alzheimer's disease Mother     Stroke Mother     Bronchiectasis Brother         presumed    Bronchiectasis  Brother         presumed    Liver disease Father  Diabetes Father     Heart failure Father     Bronchiectasis  Daughter     Asthma Son         childhood asthma    Clotting disorder Neg Hx     Anesthesia problems Neg Hx         Allergies: Gentamicin, Tobramycin, Mirtazapine, and Morphine                  Objective Findings  Precautions / Restrictions  Precautions: Falls precautions  Weight Bearing Status: Non-applicable  Required Braces or Orthoses: Non-applicable     Pain Comments: my back was hurting this morning, but it is better now. The nurse gave me some pain medication  Medical Tests / Procedures: Reviewed in EPIC  Equipment / Environment: Vascular access (PIV, TLC, Port-a-cath, PICC), Supplemental oxygen (2 L O2)     Vitals/Orthostatics : On 2 L throughout session: SpO2 95% at rest, 90% with activity, 97% with recovery     Living Situation  Living Environment: House  Lives With: Spouse  Home Living: Two level home, Stairs to enter without rails, Able to Live on main level with bedroom/bathroom, Stairs to alternate level with rails, Standard height toilet, Hand-held shower hose, Grab bars in shower, Walk-in shower (pt reports my wife and I live on the first level)  Number of Stairs to Enter (outside): 3  Caregiver Identified?: Yes (spouse)  Caregiver Availability: Intermittent  Caregiver Ability: Supervision      Cognition: WFL  Orientation: Oriented x4  Visual/Perception: Within Functional Limits  Hearing: No deficit identified     Skin Inspection: Intact where visualized     Upper Extremities  UE ROM: Right WFL, Left WFL  UE Strength: Left WFL, Right WFL    Lower Extremities  LE ROM: Right WFL, Left WFL  LE Strength: Left WFL, Right WFL          Coordination: Not tested  Proprioception: Not tested  Sensation: Impaired (reports baseline neuropathy B feet, followed by Neurology.)  Posture: WFL    Static Sitting-Level of Assistance: Independent  Dynamic Sitting-Level of Assistance: Independent    Static Standing-Level of Assistance: Independent  Dynamic Standing - Level of Assistance: Independent      Bed Mobility comments: n/t, pt received and completed session in recliner chair     Transfers: Sit to Stand  Sit to Stand assistance level: Independent      Gait Level of Assistance: Independent  Gait Assistive Device: Other (Comment) (no AD)  Gait Distance Ambulated (ft): 60 ft  Skilled Treatment Performed: 84ft, no AD, Ind. At baseline, paces activity to manage exertional dyspnea. Able to demonstrate return today and sit down when he felt exertional dyspnea.     Stairs: 2 steps x 2 trials, no AD, CGA on first trial for safety, but then mod I on second trial            Endurance: fair, 73ft, limited by exertional dyspnea    Patient at end of session: All needs in reach, In chair, Lines intact, Nurse notified    Physical Therapy Session Duration  PT Individual [mins]: 36          AM-PAC-6 click  Help currently need turning over In bed?: None - Modified Independent/Independent  Help currently needed sitting down/standing up from chair with arms? : None - Modified Independent/Independent  Help currently needed moving from supine to sitting on edge of bed?: None - Modified Independent/Independent  Help currently needed moving to  and from bed from wheelchair?: None - Modified Independent/Independent  Help currently needed walking in a hospital room?: None - Modified Independent/Independent  Help currently needed climbing 3-5 steps with railing?: None - Modified Independent/Independent    Basic Mobility Score 6 click: 24    6 click Score (in points): % of Functional Impairment, Limitation, Restriction  6: 100% impaired, limited, restricted  7-8: At least 80%, but less than 100% impaired, limited restricted  9-13: At least 60%, but less than 80% impaired, limited restricted  14-19: At least 40%, but less than 60% impaired, limited restricted  20-22: At least 20%, but less than 40% impaired, limited restricted  23: At least 1%, but less than 20% impaired, limited restricted  24: 0% impaired, limited restricted        I attest that I have reviewed the above information.  Signed: Garey Ham, PT  Filed 01/27/2023

## 2023-01-27 NOTE — Unmapped (Signed)
OCCUPATIONAL THERAPY  Treatment Note (01/26/23 1433)    Patient Name:  Jordan [name]       Medical Record Number: [id number]     Date of Birth: June 23, 1955  Sex: Male      Post-Discharge Occupational Therapy Recommendations: Skilled OT services NOT indicated          Equipment Recommendation  OT DME Recommendations: None       OT Treatment Diagnosis: Generalized muscle weakness, Reduced mobility    Decreased (I) with ADLs and funtional mobility    Assessment  Problem List: Decreased endurance, Shortness of breath        Clinical Decision Making: Low Complexity    Assessment:     Jordan [name] is a 67 y.o. male who is presenting to [facility] with bronchiectasis exacerbation in setting of chronic hypoxic history failure, chronic bronchiectasis, history of pulmonary aspergillosis, history of pulmonary MDRO Pseudomonas.      Patient presents to OT at near baseline functional level impacting pt's independence with ADLs and functional mobility/transfers compared to baseline. Patient educated on purpose and goal of OT.  Patient completing sit to stand from recliner with (I), functional ambulation ~ 20 ft x 2 with no use of assistive device with (I), functional t/f to commode with (I), simulated clothing management with (I), pt performed hand washing task with (I) standing at sink level, pt performed doffing/donning of socks with (I) seated in recliner this date. Pt exhibiting good safety awareness, activity tolerance, and (I) with ADLS.  Pt wife present and states that if pt requires assistance at home upon dc, she will be able to provide.     After review of the patient's occupational profile and history, assessment of occupational performance, clinical decision making, and development of POC, the patient presents as a low complexity case. Recommend no post acute. DME: none    Today's Interventions: ADL retraining, Balance activities, Bed mobility, Compensatory tech. training, Conservation, Education - Patient, Education - Family / caregiver, Endurance activities, Functional mobility, Insurance account manager / Proximal stability, Transfer training  Today's Interventions: Role of OT and POC, activity tolerance, functional mobility/transfers    Activity Tolerance During Today's Session  Tolerated treatment well    Plan  Planned Frequency of Treatment:    D/C Services Weekly Frequency: D/C Services     GOALS:   Patient and Family Goals: none stated    Prognosis:  Excellent  Positive Indicators:  supportive wife, better PLOF, motivated  Barriers to Discharge: None    Subjective  Medical Updates Since Last Visit/Relevant PMH Affecting Clinical Decision Making:    Prior Functional Status Pt reports (I) with ADLs, wife cooks and cleans, he reports that may drive short distances, endorses no falls    Medical Tests / Procedures: Reviewed in EPIC       Patient / Caregiver reports: pt and RN agreeable      Past Medical History:   Diagnosis Date    Abscess of lung (CMS-HCC) 11/06/2010    CT Chest 10/22/10 12/05/2010 right thoracotomy resection of right middle lobe and resection of right lower lobe abscess     Arthritis     back    Biceps tendon tear 2013    left side    Bronchiectasis (CMS-HCC)     chronic psuedomonas infection    Degenerative joint disease of left acromioclavicular joint     GERD (gastroesophageal reflux disease)     Hypertension     Obstructive sleep apnea on CPAP 03/14/13  AHI 33.5, on BiPAP 12/8 based on sleep study 09/2013    Pneumonia 2012    Lung abcess     PONV (postoperative nausea and vomiting)     Prostate cancer (CMS-HCC) 09/09/2017    Rotator cuff injury left    Vertigo     Social History     Tobacco Use    Smoking status: Never    Smokeless tobacco: Never   Substance Use Topics    Alcohol use: No     Alcohol/week: 0.0 standard drinks of alcohol      Past Surgical History:   Procedure Laterality Date    APPENDECTOMY  1969    BACK SURGERY      BRONCHOSCOPY  10/19/2010    [facility]    CARPAL TUNNEL RELEASE  1999    IR EMBOLIZATION HEMORRHAGE ART OR VEN  LYMPHATIC EXTRAVASATION  07/11/2021    IR EMBOLIZATION HEMORRHAGE ART OR VEN  LYMPHATIC EXTRAVASATION 07/11/2021 Braulio Conte, MD IMG VIR H&V [facility]    LUNG REMOVAL, PARTIAL Right 10/2010    RML and RLL partially resected    MENISCECTOMY  2011    NECK SURGERY  1993, 1999, 2002    PR GERD TST W/ MUCOS IMPEDE ELECTROD,>1HR N/A 07/06/2012    Procedure: ESOPHAGEAL FUNCTION TEST, GASTROESOPHAGEAL REFLUX TEST W/ NASAL CATHETER INTRALUMINAL IMPEDANCE ELECTRODE(S) PLACEMENT, RECORDING, ANALYSIS AND INTERPRETATION; PROLONGED;  Surgeon: None None;  Location: GI PROC[facility]URES [facility];  Service: Gastroenterology    PR OPEN TREAT RIB FRACTURE W/INT FIXATION, UNILATERAL, 1-2 RIBS Right 03/19/2015    Procedure: OPEN TREATMENT OF RIB FRACTURE REQUIRING INTERNAL FIXATION, UNILATERAL; 1-2 RIBS;  Surgeon: Evert Kohl, MD;  Location: MAIN OR [place];  Service: Cardiothoracic    PR THORACOTOMY W/THERAP WEDGE RESEXN ADDL IPSILATRL Right 08/07/2013    Procedure: THORACOTOMY; WITH THERAPEUTIC LOBECTOMY OF RIGHT MIDDLE AND RIGHT LOWER LOBE RESECTION , EACH ADDITIONAL RESECTION, IPSILATERAL;  Surgeon: Alvester Chou, MD;  Location: MAIN OR [facility];  Service: Cardiothoracic    TONSILLECTOMY  1965    Family History   Problem Relation Age of Onset    Alzheimer's disease Mother     Stroke Mother     Bronchiectasis  Brother         presumed    Bronchiectasis  Brother         presumed    Liver disease Father     Diabetes Father     Heart failure Father     Bronchiectasis  Daughter     Asthma Son         childhood asthma    Clotting disorder Neg Hx     Anesthesia problems Neg Hx         Gentamicin, Tobramycin, Mirtazapine, and Morphine     Objective Findings  Precautions / Restrictions  Falls precautions       Weight Bearing  Non-applicable    Required Braces or Orthoses  Non-applicable    Communication Preference  Verbal       Pain  pt with chronic LBP of 2/10    Equipment / Environment  Vascular access (PIV, TLC, Port-a-cath, PICC), Supplemental oxygen (2 L O2)    Living Situation  Living Environment: House  Lives With: Spouse  Home Living: Two level home, Stairs to enter without rails, Able to Live on main level with bedroom/bathroom, Stairs to alternate level with rails, Standard height toilet, Hand-held shower hose, Grab bars in shower, Walk-in shower  Number of Stairs to Psychologist, sport and exercise (  outside): 3  Caregiver Identified?: Yes (wife)  Caregiver Availability: Intermittent  Caregiver Ability: Supervision  Equipment available at home: Straight cane, Rolling walker     Cognition   Orientation Level:  Oriented x 4   Arousal/Alertness:  Appropriate responses to stimuli   Attention Span:  Appears intact   Memory:  Appears intact   Following Commands:  Follows all commands and directions without difficulty   Safety Judgment:  Good awareness of safety precautions   Awareness of Errors and Problem Solving:  Patient self-corrected errors   Comments:      Vision / Hearing   Vision: No acute deficits identified     Hearing: No deficit identified         Hand Function:  Right Hand Function: Right hand grip strength, ROM and coordination WNL  Left Hand Function: Left hand grip strength, ROM and coordination WNL    Skin Inspection:  Skin Inspection: Intact where visualized    Face/Cervical ROM:  Face ROM: WFL  Cervical ROM: WFL    ROM / Strength:  UE ROM/Strength: Left WFL, Right WFL  LE ROM/Strength: Left WFL, Right WFL    Coordination:  Coordination: WFL    Sensation:  RUE Sensation: RUE intact  LUE Sensation: LUE intact  RLE Sensation: RLE intact  LLE Sensation: LLE intact  Sensory/ Proprioception/ Stereognosis comments: Pt with c/o intermittent neuropathy to feet    Balance:  Static Sitting-Level of Assistance: Independent  Dynamic Sitting-Level of Assistance: Archivist Standing-Level of Assistance: Independent  Dynamic Standing - Level of Assistance: Independent    Functional Mobility  Transfers: Independent  Bed Mobility - Needs Assistance: Independent (NT, anticipate (I))    ADLs  ADLs: Modified Independent    Vitals / Orthostatics  Vitals/Orthostatics: SpO2 is 93-95% on 2 L O2    Patient at end of session: All needs in reach, In chair, Friends/Family present, Lines intact, Nurse notified     Occupational Therapy Session Duration  OT Individual [mins]: 19       AM-PAC-Daily Activity  Lower Body Dressing assistance needs: None - Modified Independent/Independent  Bathing assistance needs: A Little - Minimal/Contact Guard Assist/Supervision  Toileting assistance needs: None - Modified Independent/Independent  Upper Body Dressing assistance needs: None - Modified Independent/Independent  Personal Grooming assistance needs: None - Modified Independent/Independent  Eating Meals assistance needs: None - Modified Independent/Independent    Daily Activity Score: 23    Score (in points): % of Functional Impairment, Limitation, Restriction  6: 100% impaired, limited, restricted  7-8: At least 80%, but less than 100% impaired, limited restricted  9-13: At least 60%, but less than 80% impaired, limited restricted  14-19: At least 40%, but less than 60% impaired, limited restricted  20-22: At least 20%, but less than 40% impaired, limited restricted  23: At least 1%, but less than 20% impaired, limited restricted  24: 0% impaired, limited restricted      I attest that I have reviewed the above information.  Signed: Ricky Stabs, OT  Filed 01/26/2023

## 2023-01-27 NOTE — Unmapped (Signed)
PULMONARY CONSULT  NOTE      Patient: Jordan [name](09/10/1955)  Reason for consultation: [name] is a 67 y.o. male who is seen in consultation at the request of Arman Filter, MD for comprehensive evaluation of acute exacerbation of bronchiectasis.    Assessment and Recommendations:      Principal Problem:    Acute exacerbation of bronchiectasis (CMS-HCC)  Active Problems:    Bronchiectasis (CMS-HCC)    Pseudomonas aeruginosa infection    Essential hypertension    Gastroesophageal reflux disease without esophagitis    Chronic respiratory failure with hypoxia (CMS-HCC)    Pulmonary aspergilloma (CMS-HCC)       Antibiotics:  Based on prior culture data (OPF; PsA) and allergy profile, favor treating with IV Zosyn, oral Cipro, and inhaled Colistin.  Would not place PICC line just yet as  he may simply need more aggressive airway clearance and prednisone to turn the corner.  Could then discharged on 14 days of Cipro and continue his 28 days of colistin.  Suspect this may be the case given that he is rhinovirus pos.    If he feels that he needs the IV antibiotics, would treat for at least 2 weeks with all 3 antibiotics.  Continue chronic azithromycin for anti-inflammatory effect.     Other medications:  While in hospital, substitute nebulized budesonide 0.5 mg bid and arformoterol bid for Symbicort and Spiriva.  Prednisone 40 mg daily x 5 days started 01/27/23     Airway clearance: Needs aggressive airway clearance 4 times per day.  Nebulized hypertonic saline 10%; pre-treatment with duonebs.  For mechanical clearance, prefer MetaNeb and Vibra lung.  Continue Pulmozyme nebs once a day       Labs:  CBC-D, CMP, CRP, ESR, sputum for bacterial and AFB cultures (NTM screening) sent from from clinic.  Weekly CRP and ESR to trend (including as outpatient).  [facility] Guideline for Monitoring Outpatient Intravenous and Oral Antimicrobial Therapy recommended monitoring labs ([email or web address])          Other Testing:  Patient does not need PFTs performed while in the hospital.     [facility]):  Patient is not a candidate for [facility] due to distance.     Dispo:   Patient has a follow-up appointment with me scheduled for 04/20/23.  Patient does not need to remain in the hospital for the entire course of IV antibiotics if elects to continue two weeks of Zosyn.   If he is discharging on IV Zosyn, please reach out to [facility] Bronchiectasis/NTM Care and [facility] team (myself, Alvino Blood RN, Eliberto Ivory CPP) prior to discharge to ensure that home infusion orders are correct and that we are comfortable with patient discharging home on IVs at that time.  We will follow monitoring labs.  Please use DOTPULMIVDISCHARGE for home infusion orders. We will follow monitoring labs. Does not need OPAT.  Do not discharge Friday through Sunday or on a holiday as no outpatient clinical staff available to assist if problems arise with home health/home infusion.        We appreciate the opportunity to assist in the care of this patient.  Please page [phone] with any questions.    Verneita Griffes, MD    Subjective:      History of Present Illness:  [name] is a 68 y.o. male admitted for bronchiectasis exacerbation.  Presented to clinic on 11/5 with worsening SOB over the past few days and was directly  admitted for tx.      Interval Hx 11.16.24:  SOB improved.  Producing good sputum.  Rhinovirus is pos.     Review of Systems: A comprehensive review of systems was performed and was negative except as above in HPI  Past Medical History:   Diagnosis Date    Abscess of lung (CMS-HCC) 11/06/2010    CT Chest 10/22/10 12/05/2010 right thoracotomy resection of right middle lobe and resection of right lower lobe abscess     Arthritis     back    Biceps tendon tear 2013    left side    Bronchiectasis (CMS-HCC)     chronic psuedomonas infection    Degenerative joint disease of left acromioclavicular joint     GERD (gastroesophageal reflux disease)     Hypertension     Obstructive sleep apnea on CPAP 03/14/13    AHI 33.5, on BiPAP 12/8 based on sleep study 09/2013    Pneumonia 2012    Lung abcess     PONV (postoperative nausea and vomiting)     Prostate cancer (CMS-HCC) 09/09/2017    Rotator cuff injury left    Vertigo      Past Surgical History:   Procedure Laterality Date    APPENDECTOMY  1969    BACK SURGERY      BRONCHOSCOPY  10/19/2010    [facility]    CARPAL TUNNEL RELEASE  1999    IR EMBOLIZATION HEMORRHAGE ART OR VEN  LYMPHATIC EXTRAVASATION  07/11/2021    IR EMBOLIZATION HEMORRHAGE ART OR VEN  LYMPHATIC EXTRAVASATION 07/11/2021 Braulio Conte, MD IMG VIR H&V [facility]    LUNG REMOVAL, PARTIAL Right 10/2010    RML and RLL partially resected    MENISCECTOMY  2011    NECK SURGERY  1993, 1999, 2002    PR GERD TST W/ MUCOS IMPEDE ELECTROD,>1HR N/A 07/06/2012    Procedure: ESOPHAGEAL FUNCTION TEST, GASTROESOPHAGEAL REFLUX TEST W/ NASAL CATHETER INTRALUMINAL IMPEDANCE ELECTRODE(S) PLACEMENT, RECORDING, ANALYSIS AND INTERPRETATION; PROLONGED;  Surgeon: None None;  Location: [facility]URES [facility];  Service: Gastroenterology    PR OPEN TREAT RIB FRACTURE W/INT FIXATION, UNILATERAL, 1-2 RIBS Right 03/19/2015    Procedure: OPEN TREATMENT OF RIB FRACTURE REQUIRING INTERNAL FIXATION, UNILATERAL; 1-2 RIBS;  Surgeon: Evert Kohl, MD;  Location: MAIN OR [facility];  Service: Cardiothoracic    PR THORACOTOMY W/THERAP WEDGE RESEXN ADDL IPSILATRL Right 08/07/2013    Procedure: THORACOTOMY; WITH THERAPEUTIC LOBECTOMY OF RIGHT MIDDLE AND RIGHT LOWER LOBE RESECTION , EACH ADDITIONAL RESECTION, IPSILATERAL;  Surgeon: Alvester Chou, MD;  Location: MAIN OR [facility];  Service: Cardiothoracic TONSILLECTOMY  1965     Medications reviewed in Epic  Allergies as of 01/26/2023 - Reviewed 01/26/2023   Allergen Reaction Noted    Gentamicin Other (See Comments) 07/28/2013    Tobramycin Other (See Comments) 11/18/2012    Mirtazapine Other (See Comments) 04/14/2022    Morphine Nausea And Vomiting 07/05/2012     Family History   Problem Relation Age of Onset    Alzheimer's disease Mother     Stroke Mother     Bronchiectasis  Brother         presumed    Bronchiectasis  Brother         presumed    Liver disease Father     Diabetes Father     Heart failure Father     Bronchiectasis  Daughter     Asthma Son  childhood asthma    Clotting disorder Neg Hx     Anesthesia problems Neg Hx      Social History     Tobacco Use    Smoking status: Never    Smokeless tobacco: Never   Substance Use Topics    Alcohol use: No     Alcohol/week: 0.0 standard drinks of alcohol        Objective:      Physical Exam:  Vitals:    01/26/23 2221 01/27/23 0000 01/27/23 0330 01/27/23 0815   BP: 134/78   118/69   Pulse: 102 106 89 75   Resp: 17  18 16    Temp: 36.5 ??C (97.7 ??F)   35.9 ??C (96.6 ??F)   TempSrc: Temporal   Temporal   SpO2: 94%  96% 95%   Weight:       Height:         General appearance - well appearing, well nourished, and in no distress  Heart - regular rate and rhythm, normal S1/S2, no gallops, rubs, or murmurs  Chest -absent breath sounds over the right middle and lower lobe, corresponding with prior resection. Good air movement globally.   Easy work of breathing without accessory muscle use.  Speaking in full sentences without difficulty.  Abdomen - +BS  Extremities - Warm, well perfused Clubbing of fingers.  No cyanosis.  Skin - normal coloration  Neurological - alert, oriented, normal speech, no focal findings or movement disorder noted  Psych:  CAM -    Malnutrition Assessment by RD:          Diagnostic Review:   All labs and images were personally reviewed.

## 2023-01-27 NOTE — Unmapped (Addendum)
Oswell Brackin is a 67 year old man with history of chronic bronchiectasis with chronic hypoxemic respiratory failure, pulmonary aspergillosis and chronic MDR Pseudomonas colonization, HTN, HLD, GERD who presents with acute bronchiectasis exacerbation.     Chronic bronchiectasis with acute exacerbation  Hx pulmonary aspergillosis  Hx MDR Pseudomonas chronic colonization  Chronic hypoxemic respiratory failure  Presented to Pulmonology clinic 11/5 with progressive dyspnea, productive cough in setting of being without home Spiriva x 1 month, concerning for disruption of respiratory homeostasis vs bronchiectasis flare so was referred for direct admission for intensification of airway clearance and antibiotics. He was not septic and was requiring his home 2L [place]. Labs showed normal WBC but elevated ESR 46, normal CRP 8.0. CXR showed stable chronic changes in right apex consistent with volume loss. Pulmonology consulted. He was treated with prednisone 40 mg daily x 5 d (11/5-11/9) and IV Zosyn, PO Cipro, and inhaled Colistin based on prior culture data. For airway clearance, he was treated with DuoNebs + HTS 10% QID, Pulmozyme nebs daily, and MetaNeb/Vibralung.     Last TTE 12/2021 with LVEF 55%, normal diastolic function, mild AR, normal RV size and systolic function.

## 2023-01-27 NOTE — Unmapped (Signed)
Admission Date Requested: 01/26/23    Reason for Admission: Bronchiectasis Exacerbation    Antibiotics:  Based on prior culture data (OPF; PsA) and allergy profile, favor treating with IV Zosyn, oral Cipro, and inhaled Colistin.  Would not place PICC line just yet as they simply need more aggressive airway clearance and prednisone to turn the corner.  Could then discharged on 14 days of Cipro and continue his 28 days of colistin.    If he feels that he needs the IV antibiotics, would treat for at least 2 weeks with all 3 antibiotics.  Continue chronic azithromycin for anti-inflammatory effect.    Other medications:  While in hospital, substitute nebulized budesonide 0.5 mg bid and arformoterol bid for Symbicort and Spiriva.  Prednisone 40 mg daily x 5 days starting tomorrow AM.    Airway clearance: Needs aggressive airway clearance 4 times per day.  Nebulized hypertonic saline 10%; pre-treatment with duonebs.  For mechanical clearance, prefer MetaNeb and Vibra lung.  Continue Pulmozyme nebs once a day    Consults:  Pulmonology    Labs:  Have already sent CBC-D, CMP, CRP, ESR, sputum for bacterial and AFB cultures (NTM screening) from clinic.  Weekly CRP and ESR to trend (including as outpatient).  [employer] Guideline for Monitoring Outpatient Intravenous and Oral Antimicrobial Therapy recommended monitoring labs ([email or web address])    Imaging:  Deferred to admitting provider    Other Testing:  Patient does not need PFTs performed while in the hospital.    [facility]  Patient is not a candidate for [facility] due to distance.    Dispo:   Patient has a follow-up appointment with me scheduled for 04/20/23.  Patient does not need to remain in the hospital for the entire course of IV antibiotics if elects to continue two weeks of Zosyn.   If he is discharging on IV Zosyn, please reach out to [facility] Bronchiectasis/NTM Care and [facility] team (myself, Alvino Blood RN, Eliberto Ivory CPP) prior to discharge to ensure that home infusion orders are correct and that we are comfortable with patient discharging home on IVs at that time.  We will follow monitoring labs.  Please use DOTPULMIVDISCHARGE for home infusion orders. We will follow monitoring labs. Does not need OPAT.  Do not discharge Friday through Sunday or on a holiday as no outpatient clinical staff available to assist if problems arise with home health/home infusion.     I am available to assist the primary team with his care during hospitalization.

## 2023-01-27 NOTE — Unmapped (Addendum)
Adult Nutrition Assessment Note    Visit Type: RN Consult  Reason for Visit: Assessment (Nutrition)    NUTRITION INTERVENTIONS and RECOMMENDATION     Recommend Ensure Plus High Protein TID- chocolate per patient preference  Weigh weekly  Document PO intakes  Monitor electrolytes, replace as needed    NUTRITION ASSESSMENT     Current nutrition therapy is appropriate and likely meeting nutritional needs at this time.   Patient would benefit from start of oral supplement to better meet nutritional needs.  Given patient with prolonged low PO intake for 3-4 weeks prior to admission  Patient reports eating majority of food on trays since admission    NUTRITIONALLY RELEVANT DATA     HPI & PMH:   67 y.o. male who is presenting to [facility] with bronchiectasis exacerbation in setting of chronic hypoxic history failure, chronic bronchiectasis, history of pulmonary aspergillosis, history of pulmonary MDRO Pseudomonas     Nutrition History:   11/06- Patient reports poor intake for one month prior to admission, recently improving over the past couple days. Patient reports that poor intake was related to excessive coughing. Sometimes patient would vomit after eating a meal due to coughing. Patient then reduced PO intake to prevent vomiting when coughing    Medications:  Nutritionally pertinent medications reviewed and evaluated for potential food and/or medication interactions.  Vitamin D3, Protonix, Prednisone, Vitamin C    Labs:   Nutritionally pertinent labs reviewed and include Magnesium: 1.5 mg/dL and Glucose: 706 mg/dL    Nutritional Needs:   Healthy balance of carbohydrate, protein, and fat.     Malnutrition Assessment:  Malnutrition Assessment using AND/ASPEN or GLIM Clinical Characteristics:                    Nutrition Focused Physical Exam:  Unable to complete at this time due to patient being ruled out for COVID-19    Care plan:  Not completed, unable to diagnose malnutrition at this time    Current Nutrition:  Oral intake Nutrition Orders            Nutrition Therapy Regular/House starting at 11/05 1355            Nutritionally Pertinent Allergies, Intolerances, Sensitivities, and/or Cultural/Religious Restrictions:  none identified per chart review at this time     Anthropometric Data:  Height: 170.2 cm (5' 7)   Admission weight: 78.2 kg (172 lb 6.4 oz)  Last recorded weight: 78.2 kg (172 lb 6.4 oz)  Date of last recorded weight: 11/05  IBW: 67.2 kg  BMI: Body mass index is 27 kg/m??.   Usual Body Weight:  171-172 lb per patient report  Patient reports higher weights may be related to weight with clothes on as well as portable   Wt Readings from Last 25 Encounters:   01/26/23 78.2 kg (172 lb 6.4 oz)   01/26/23 78.5 kg (173 lb)   01/11/23 78 kg (172 lb)   11/07/22 78.4 kg (172 lb 12.8 oz)   10/23/22 79.8 kg (176 lb)   09/17/22 80.7 kg (178 lb)   08/04/22 81.6 kg (180 lb)   05/19/22 83 kg (183 lb)   04/14/22 82.6 kg (182 lb)   04/13/22 81.9 kg (180 lb 9.6 oz)   02/26/22 82.1 kg (181 lb)   01/13/22 84.2 kg (185 lb 9.6 oz)   01/05/22 86.4 kg (190 lb 6.4 oz)   12/22/21 88.5 kg (195 lb)   11/21/21 86.1 kg (189 lb 12.8 oz)  10/14/21 78.9 kg (174 lb)   07/11/21 79.4 kg (175 lb)   07/01/21 81.2 kg (179 lb 0.2 oz)   07/01/21 81.2 kg (179 lb)   03/18/21 80.1 kg (176 lb 9.4 oz)   03/18/21 83 kg (183 lb)   11/18/20 80.1 kg (176 lb 9.4 oz)   10/31/20 83 kg (183 lb)   07/02/20 83.9 kg (185 lb)   07/02/20 83.9 kg (185 lb)       GOALS and EVALUATION     Patient to meet 75% or greater of nutritional needs via combination of meals, snacks, and/or oral supplements within admission.  - New    Motivation, Barriers, and Compliance:  Evaluation of motivation, barriers, and compliance completed. No concerns identified at this time.     Discharge Planning:   Monitor for potential discharge needs with multi-disciplinary team.       Follow-Up Parameters:   1-2 times per week (and more frequent as indicated)    Alesia Morin, RD, LDN

## 2023-01-28 DIAGNOSIS — J471 Bronchiectasis with (acute) exacerbation: Secondary | ICD-10-CM | POA: Diagnosis not present

## 2023-01-28 LAB — BASIC METABOLIC PANEL
ANION GAP: 6 mmol/L (ref 5–14)
BLOOD UREA NITROGEN: 24 mg/dL — ABNORMAL HIGH (ref 9–23)
BUN / CREAT RATIO: 21
CALCIUM: 9.1 mg/dL (ref 8.7–10.4)
CHLORIDE: 107 mmol/L (ref 98–107)
CO2: 29.1 mmol/L (ref 20.0–31.0)
CREATININE: 1.15 mg/dL
EGFR CKD-EPI (2021) MALE: 69 mL/min/{1.73_m2} (ref >=60)
GLUCOSE RANDOM: 90 mg/dL (ref 70–179)
POTASSIUM: 4.1 mmol/L (ref 3.4–4.8)
SODIUM: 142 mmol/L (ref 135–145)

## 2023-01-28 MED ADMIN — azithromycin (ZITHROMAX) tablet 250 mg: 250 mg | ORAL | @ 14:00:00 | Stop: 2023-02-09

## 2023-01-28 MED ADMIN — sodium chloride 10 % NEBULIZER solution 5 mL: 5 mL | RESPIRATORY_TRACT | @ 13:00:00

## 2023-01-28 MED ADMIN — oxyCODONE (ROXICODONE) immediate release tablet 7.5 mg: 7.5 mg | ORAL | @ 02:00:00 | Stop: 2023-02-09

## 2023-01-28 MED ADMIN — atorvastatin (LIPITOR) tablet 10 mg: 10 mg | ORAL | @ 14:00:00

## 2023-01-28 MED ADMIN — dilTIAZem (CARDIZEM CD) 24 hr capsule 120 mg: 120 mg | ORAL | @ 14:00:00

## 2023-01-28 MED ADMIN — sodium chloride 10 % NEBULIZER solution 5 mL: 5 mL | RESPIRATORY_TRACT | @ 01:00:00

## 2023-01-28 MED ADMIN — budesonide (PULMICORT) nebulizer solution 0.5 mg: .5 mg | RESPIRATORY_TRACT | @ 01:00:00

## 2023-01-28 MED ADMIN — dornase alfa (PULMOZYME) 1 mg/mL solution 2.5 mg: 2.5 mg | RESPIRATORY_TRACT | @ 13:00:00

## 2023-01-28 MED ADMIN — budesonide (PULMICORT) nebulizer solution 0.5 mg: .5 mg | RESPIRATORY_TRACT | @ 13:00:00

## 2023-01-28 MED ADMIN — ipratropium-albuterol (DUO-NEB) 0.5-2.5 mg/3 mL nebulizer solution 3 mL: 3 mL | RESPIRATORY_TRACT | @ 16:00:00

## 2023-01-28 MED ADMIN — ciprofloxacin HCl (CIPRO) tablet 500 mg: 500 mg | ORAL | @ 23:00:00 | Stop: 2023-02-17

## 2023-01-28 MED ADMIN — colistimethate (COLYMYCIN) 150 mg in sodium chloride (NS) 4 mL inhalation: 150 mg | RESPIRATORY_TRACT | @ 01:00:00

## 2023-01-28 MED ADMIN — ascorbic acid (vitamin C) (VITAMIN C) tablet 1,000 mg: 1000 mg | ORAL | @ 15:00:00

## 2023-01-28 MED ADMIN — colistimethate (COLYMYCIN) 150 mg in sodium chloride (NS) 4 mL inhalation: 150 mg | RESPIRATORY_TRACT | @ 13:00:00

## 2023-01-28 MED ADMIN — ipratropium-albuterol (DUO-NEB) 0.5-2.5 mg/3 mL nebulizer solution 3 mL: 3 mL | RESPIRATORY_TRACT | @ 13:00:00

## 2023-01-28 MED ADMIN — ciprofloxacin HCl (CIPRO) tablet 500 mg: 500 mg | ORAL | @ 10:00:00 | Stop: 2023-02-17

## 2023-01-28 MED ADMIN — piperacillin-tazobactam (ZOSYN) IVPB (premix) 4.5 g: 4.5 g | INTRAVENOUS | @ 23:00:00 | Stop: 2023-02-17

## 2023-01-28 MED ADMIN — montelukast (SINGULAIR) tablet 10 mg: 10 mg | ORAL | @ 02:00:00

## 2023-01-28 MED ADMIN — piperacillin-tazobactam (ZOSYN) IVPB (premix) 4.5 g: 4.5 g | INTRAVENOUS | @ 10:00:00 | Stop: 2023-02-17

## 2023-01-28 MED ADMIN — piperacillin-tazobactam (ZOSYN) IVPB (premix) 4.5 g: 4.5 g | INTRAVENOUS | @ 17:00:00 | Stop: 2023-02-17

## 2023-01-28 MED ADMIN — predniSONE (DELTASONE) tablet 40 mg: 40 mg | ORAL | @ 14:00:00 | Stop: 2023-01-31

## 2023-01-28 MED ADMIN — sodium chloride 10 % NEBULIZER solution 5 mL: 5 mL | RESPIRATORY_TRACT | @ 21:00:00

## 2023-01-28 MED ADMIN — arformoterol (BROVANA) nebulizer solution 15 mcg/2 mL: 15 ug | RESPIRATORY_TRACT | @ 01:00:00

## 2023-01-28 MED ADMIN — ipratropium-albuterol (DUO-NEB) 0.5-2.5 mg/3 mL nebulizer solution 3 mL: 3 mL | RESPIRATORY_TRACT | @ 20:00:00

## 2023-01-28 MED ADMIN — sodium chloride 10 % NEBULIZER solution 5 mL: 5 mL | RESPIRATORY_TRACT | @ 16:00:00

## 2023-01-28 MED ADMIN — arformoterol (BROVANA) nebulizer solution 15 mcg/2 mL: 15 ug | RESPIRATORY_TRACT | @ 13:00:00

## 2023-01-28 MED ADMIN — cholecalciferol (vitamin D3-10 mcg (400 unit)) tablet 50 mcg: 50 ug | ORAL | @ 14:00:00

## 2023-01-28 MED ADMIN — piperacillin-tazobactam (ZOSYN) IVPB (premix) 4.5 g: 4.5 g | INTRAVENOUS | @ 05:00:00 | Stop: 2023-02-17

## 2023-01-28 MED ADMIN — pantoprazole (Protonix) EC tablet 40 mg: 40 mg | ORAL | @ 14:00:00

## 2023-01-28 MED ADMIN — oxyCODONE (ROXICODONE) immediate release tablet 7.5 mg: 7.5 mg | ORAL | @ 11:00:00 | Stop: 2023-02-09

## 2023-01-28 MED ADMIN — oxyCODONE (ROXICODONE) immediate release tablet 7.5 mg: 7.5 mg | ORAL | @ 19:00:00 | Stop: 2023-02-09

## 2023-01-28 MED ADMIN — ipratropium-albuterol (DUO-NEB) 0.5-2.5 mg/3 mL nebulizer solution 3 mL: 3 mL | RESPIRATORY_TRACT | @ 01:00:00

## 2023-01-28 NOTE — Unmapped (Signed)
Pt received all scheduled breathing txs.  Vibralung done for airway clearance.  Pt has strong, productive cough.  Pt reports thick, light brown sptutum.  BBS diminished with crackles.  On baseline 2L [place].  VSS.       Problem: Airway Clearance Ineffective  Goal: Effective Airway Clearance  Outcome: Ongoing - Unchanged

## 2023-01-28 NOTE — Unmapped (Signed)
Pt remains on IV abx and scheduled nebs. O2 in place at 2L [place], sats WNL. Cont pulse ox and tele in place, no calls from center. PRN oxycodone given for c/o chronic low back pain, effective. No other complaints or concerns voiced. Safety protocol remains in place, will cont to monitor.    6CIT Score for Cognitive Screening:  Complete for all new admissions, scoring found on the back of the Kardex  If a patient has known dementia, no need to complete the 6CIT  [] Patient has dementia  [] Normal 6CIT (<8)    [] Significant 6CIT (8 or higher)  [] Needs to be completed    CAM Delirium Screening Tool  Be sure to ask questions to gauge orientation and attention during your assessment  Feature Present? Scoring: CAM Positive if:   Acute onset (different from baseline) with Fluctuating course [] Yes    [x] No   Yes to BOTH of these AND.Marland KitchenMarland Kitchen   Inattention (days of the week or months of the year backward) [] Yes    [x] No    Disorganized thinking [] Yes    [x] No   Yes to at least   ONE of these   Altered level of consciousness  [] Yes    [x] No    CAM []  Positive  [x] Negative    CAM-S: Symptom Severity Score  Complete on all patients >=61 years old, including in patients with dementia  Do not complete if the patient is known to be non-verbal due to their dementia  Feature  Severity Score  Question   ACUTE ONSET  & FLUCTUATING  COURSE [x] No (0)    [] Yes (1)    Is there evidence of an acute change in mental status from the patient's baseline? Did the patient's behavior fluctuate at any point during the interview for any of the 10 features?   2. INATTENTION [x] No (0)    [] Yes, mild (1)  [] Yes, marked (2)  Did the patient have difficulty focusing attention, for example being easily distractible, or having difficulty keeping track of what was being said?   3. DISORGANIZED  THINKING [x] No (0)    [] Yes, mild (1)  [] Yes, marked (2)  Was the patient's thinking disorganized or incoherent, such as rambling or irrelevant conversation, unclear or illogical flow or of ideas, unpredictable switching from subject to subject?   4. ALTERED LEVEL  OF  CONSCIOUSNESS [x] Normal (0)    [] Mild: vigilant or lethargic (1)  [] Marked: stupor or coma (2)    Overall, how would you rate the patient's level of consciousness?   -Alert (normal)   -Vigilant  -Lethargic  -Stupor  -Coma  -Uncertain   5. DISORIENTATION [x] No (0)    [] Yes, mild (1)  [] Yes, marked (2)  Was the patient disoriented at any time during the interview, such as thinking he/she was somewhere other than the hospital, using the wrong bed, or misjudging the time of day?   6. MEMORY  IMPAIRMENT [x] No (0)    [] Yes, mild (1)  [] Yes, marked (2)    Did the patient demonstrate any memory problems during the interview, such as inability to remember events in the hospital or difficulty remembering instructions?   7. PERCEPTUAL  DISTURBANCES [x] No (0)    [] Yes, mild (1)  [] Yes, marked (2)  Did the patient have any evidence of perceptual disturbances, for example, hallucinations, illusions, or misinterpretations (such as thinking something was moving when it was not)?   8. PSYCHOMOTOR  AGITATION [x] No (0)    [] Yes, mild (1)  [] Yes, marked (  2)  At any time during the interview, did the patient have an unusually increased level of motor activity, such as restlessness, picking at bedclothes, tapping fingers, or making frequent sudden changes of position?   9. PSYCHOMOTOR  RETARDATION [x] No (0)    [] Yes, mild (1)  [] Yes, marked (2)  At any time during the interview, did the patient have an unusually decreased level of motor activity, such as sluggishness, staring into space, staying in one position for a long time, or moving very slowly?   10. ALTERED  SLEEP-WAKE  CYCLE  [x] No (0)    [] Yes, mild (1)  [] Yes, marked (2)  Did the patient have evidence of disturbance of the sleep-wake cycle, such as excessive daytime sleepiness with insomnia at   night?   Long Form   SEVERITY SCORE:  Severity Score (add rows 1-10)  Total (0-19): 0    Scoring the CAM-S: Rate each symptom of delirium listed in the CAM instrument as absent (0), mild (1), marked (2). Acute onset or fluctuation is rated as absent (0) or present (1). Summarize these scores into a composite.     Problem: Adult Inpatient Plan of Care  Goal: Plan of Care Review  Outcome: Progressing  Goal: Patient-Specific Goal (Individualized)  Outcome: Progressing  Goal: Absence of Hospital-Acquired Illness or Injury  Outcome: Progressing  Intervention: Identify and Manage Fall Risk  Recent Flowsheet Documentation  Taken 01/27/2023 2000 by Renette Butters, RN ADN  Safety Interventions: fall reduction program maintained  Intervention: Prevent Skin Injury  Recent Flowsheet Documentation  Taken 01/27/2023 2000 by Renette Butters, RN ADN  Positioning for Skin: Sitting in Chair  Goal: Optimal Comfort and Wellbeing  Outcome: Progressing  Goal: Readiness for Transition of Care  Outcome: Progressing  Goal: Rounds/Family Conference  Outcome: Progressing     Problem: Infection  Goal: Absence of Infection Signs and Symptoms  Outcome: Progressing  Intervention: Prevent or Manage Infection  Recent Flowsheet Documentation  Taken 01/27/2023 2000 by Renette Butters, RN ADN  Isolation Precautions: droplet precautions maintained

## 2023-01-28 NOTE — Unmapped (Signed)
Hospital Medicine Daily Progress Note    Assessment/Plan:    Principal Problem:    Acute exacerbation of bronchiectasis (CMS-HCC)  Active Problems:    Bronchiectasis (CMS-HCC)    Pseudomonas aeruginosa infection    Essential hypertension    Gastroesophageal reflux disease without esophagitis    Chronic respiratory failure with hypoxia (CMS-HCC)    Pulmonary aspergilloma (CMS-HCC)                 Jordan [name] is a 67 y.o. male with history of bronchiectasis, pulmonary aspergillosis and MDR Pseudomonas infection, chronic hypoxemic respiratory failure that presented to [facility] with Acute exacerbation of bronchiectasis (CMS-HCC).    Acute on chronic bronchiectasis exacerbation  History of pulmonary aspergillosis  History of MDRO Pseudomonas aeruginosa respiratory infection  Chronic hypoxic respiratory failure  Presented to pulmonology clinic on 11/5 with progressive dyspnea, productive cough x 2 weeks. Does have prior history of hemoptysis s/p RUL bronchial artery embolization. Does not appear septic on presentation. CXR without acute infiltrate. Labs notable for elevated ESR 46 and RPP positive for rhinovirus. Pro-BNP elevated. He is not more hypoxemic than baseline 2L [place]. No clinical signs of volume overload and low suspicion for heart failure.   - Appreciate Pulmonology recommendations  - Follow up LRCx, AFB smear. MRSA nares negative  - Change antibiotics to IV Zosyn, PO Cipro, inhaled Colistin x 2-3 weeks (11/6 - )   - Will hold off on PICC for now in case responds quickly and can be discharged on PO/inhaled meds  - continue daily azithromycin  - Continue prednisone 40 mg daily x 5d (11/5-11/9)  - Airway clearance: DuoNebs + HTS 10% QID + Vibralung or MetaNeb  - Continue Pulmozyme daily  - Continue Singulair nightly  - Continue home supplemental O2 2L [place]  -Monitor for signs of recurrent hemoptysis, note he has previous history of embolization of right upper lobe vasculature     Secondary/Additional Active Problems: Essential hypertension - Paroxysmal supraventricular tachycardia: Diltiazem 120 every day     Hyperlipidemia: Continue atorvastatin for now, check lipid panel/ASCVD risk     GERD: Protonix    Chronic pain: continue home oxycodone    FEN: Regular diet  VTE ppx: Low risk, ambulation  Code status: Full code,  HCDM: Mehul Plotnick - Spouse - [phone]  Dispo: home pending improvement in respiratory status and treatment course for antibiotics     I personally spent 50 minutes face-to-face and non-face-to-face in the care of this patient, which includes all pre, intra, and post visit time on the date of service.  All documented time was specific to the E/M visit and does not include any procedures that may have been performed.    ___________________________________________________________________    Subjective:  No acute events overnight. Breathing is similar to yesterday. Still having productive cough. Denies chest pain or palpitations.     Labs/Studies:  Labs and Studies from the last 24hrs per EMR and Reviewed    Objective:  Temp:  [35.9 ??C (96.6 ??F)-36.5 ??C (97.7 ??F)] 36.5 ??C (97.7 ??F)  Heart Rate:  [75-106] 82  Resp:  [16-18] 18  BP: (118-134)/(69-78) 124/73  FiO2 (%):  [28 %] 28 %  SpO2:  [84 %-96 %] 94 %    Gen: Sitting up in chair, appears mildly uncomfortable due to back pain  Eyes: EOMI, conjunctivae clear  ENT: MMM  CV: RRR, nl S1 S2, no m/r/g  Pulm: Normal WOB on . CTA in left lung. Coarse crackles and some  inspiratory squeaks, prolonged expiratory phase on right lung without overt wheezing  Abd: Soft, NTND, +BS  Ext: WWP, no edema or cyanosis  Skin: No rashes or lesions  Neuro: A&O x 3, no focal deficits  Psych: Appropriate affect

## 2023-01-28 NOTE — Unmapped (Signed)
Pt vitals remain stable and pt tolerating meds well. PRN oxy given for pain. Bed locked and low and call bell within reach. Nothing significant to report.     6CIT Score for Cognitive Screening:  Complete for all new admissions, scoring found on the back of the Kardex  If a patient has known dementia, no need to complete the 6CIT  [] Patient has dementia  [] Normal 6CIT (<8)    [] Significant 6CIT (8 or higher)  [] Needs to be completed    CAM Delirium Screening Tool  Be sure to ask questions to gauge orientation and attention during your assessment  Feature Present? Scoring: CAM Positive if:   Acute onset (different from baseline) with Fluctuating course [] Yes    [x] No   Yes to BOTH of these AND.Marland KitchenMarland Kitchen   Inattention (days of the week or months of the year backward) [] Yes    [x] No    Disorganized thinking [] Yes    [x] No   Yes to at least   ONE of these   Altered level of consciousness  [] Yes    [x] No    CAM []  Positive  [] Negative    CAM-S: Symptom Severity Score  Complete on all patients >=61 years old, including in patients with dementia  Do not complete if the patient is known to be non-verbal due to their dementia  Feature  Severity Score  Question   ACUTE ONSET  & FLUCTUATING  COURSE [x] No (0)    [] Yes (1)    Is there evidence of an acute change in mental status from the patient's baseline? Did the patient's behavior fluctuate at any point during the interview for any of the 10 features?   2. INATTENTION [x] No (0)    [] Yes, mild (1)  [] Yes, marked (2)  Did the patient have difficulty focusing attention, for example being easily distractible, or having difficulty keeping track of what was being said?   3. DISORGANIZED  THINKING [x] No (0)    [] Yes, mild (1)  [] Yes, marked (2)  Was the patient's thinking disorganized or incoherent, such as rambling or irrelevant conversation, unclear or illogical flow or of ideas, unpredictable switching from subject to subject?   4. ALTERED LEVEL  OF  CONSCIOUSNESS [x] Normal (0) [] Mild: vigilant or lethargic (1)  [] Marked: stupor or coma (2)    Overall, how would you rate the patient's level of consciousness?   -Alert (normal)   -Vigilant  -Lethargic  -Stupor  -Coma  -Uncertain   5. DISORIENTATION [x] No (0)    [] Yes, mild (1)  [] Yes, marked (2)  Was the patient disoriented at any time during the interview, such as thinking he/she was somewhere other than the hospital, using the wrong bed, or misjudging the time of day?   6. MEMORY  IMPAIRMENT [x] No (0)    [] Yes, mild (1)  [] Yes, marked (2)    Did the patient demonstrate any memory problems during the interview, such as inability to remember events in the hospital or difficulty remembering instructions?   7. PERCEPTUAL  DISTURBANCES [x] No (0)    [] Yes, mild (1)  [] Yes, marked (2)  Did the patient have any evidence of perceptual disturbances, for example, hallucinations, illusions, or misinterpretations (such as thinking something was moving when it was not)?   8. PSYCHOMOTOR  AGITATION [x] No (0)    [] Yes, mild (1)  [] Yes, marked (2)  At any time during the interview, did the patient have an unusually increased level of motor activity, such as restlessness, picking at bedclothes, tapping fingers,  or making frequent sudden changes of position?   9. PSYCHOMOTOR  RETARDATION [x] No (0)    [] Yes, mild (1)  [] Yes, marked (2)  At any time during the interview, did the patient have an unusually decreased level of motor activity, such as sluggishness, staring into space, staying in one position for a long time, or moving very slowly?   10. ALTERED  SLEEP-WAKE  CYCLE  [x] No (0)    [] Yes, mild (1)  [] Yes, marked (2)  Did the patient have evidence of disturbance of the sleep-wake cycle, such as excessive daytime sleepiness with insomnia at   night?   Long Form   SEVERITY SCORE:  Severity Score (add rows 1-10)  Total (0-19):     Scoring the CAM-S: Rate each symptom of delirium listed in the CAM instrument as absent (0), mild (1), marked (2). Acute onset or fluctuation is rated as absent (0) or present (1). Summarize these scores into a composite.     Problem: Adult Inpatient Plan of Care  Goal: Plan of Care Review  Outcome: Progressing  Goal: Patient-Specific Goal (Individualized)  Outcome: Progressing  Goal: Absence of Hospital-Acquired Illness or Injury  Outcome: Progressing  Intervention: Identify and Manage Fall Risk  Recent Flowsheet Documentation  Taken 01/27/2023 1800 by Sherrie Mustache, RN  Safety Interventions:   low bed   fall reduction program maintained   nonskid shoes/slippers when out of bed  Taken 01/27/2023 1600 by Sherrie Mustache, RN  Safety Interventions:   low bed   fall reduction program maintained   nonskid shoes/slippers when out of bed  Taken 01/27/2023 1400 by Sherrie Mustache, RN  Safety Interventions:   low bed   fall reduction program maintained   nonskid shoes/slippers when out of bed  Taken 01/27/2023 1200 by Sherrie Mustache, RN  Safety Interventions:   low bed   fall reduction program maintained   nonskid shoes/slippers when out of bed  Taken 01/27/2023 1000 by Sherrie Mustache, RN  Safety Interventions:   low bed   fall reduction program maintained   nonskid shoes/slippers when out of bed  Taken 01/27/2023 0800 by Sherrie Mustache, RN  Safety Interventions:   low bed   fall reduction program maintained   nonskid shoes/slippers when out of bed  Intervention: Prevent Skin Injury  Recent Flowsheet Documentation  Taken 01/27/2023 1800 by Sherrie Mustache, RN  Positioning for Skin: Supine/Back  Skin Protection: adhesive use limited  Taken 01/27/2023 1600 by Sherrie Mustache, RN  Positioning for Skin: Supine/Back  Skin Protection: adhesive use limited  Taken 01/27/2023 1400 by Sherrie Mustache, RN  Positioning for Skin: Supine/Back  Skin Protection: adhesive use limited  Taken 01/27/2023 1200 by Sherrie Mustache, RN  Positioning for Skin: Supine/Back  Skin Protection: adhesive use limited  Taken 01/27/2023 1000 by Sherrie Mustache, RN  Positioning for Skin: Supine/Back  Skin Protection: adhesive use limited  Taken 01/27/2023 0800 by Sherrie Mustache, RN  Positioning for Skin: Supine/Back  Skin Protection: adhesive use limited  Goal: Optimal Comfort and Wellbeing  Outcome: Progressing  Goal: Readiness for Transition of Care  Outcome: Progressing  Goal: Rounds/Family Conference  Outcome: Progressing     Problem: Infection  Goal: Absence of Infection Signs and Symptoms  Outcome: Progressing  Intervention: Prevent or Manage Infection  Recent Flowsheet Documentation  Taken 01/27/2023 1600 by Sherrie Mustache, RN  Isolation Precautions: contact precautions maintained  Taken 01/27/2023 1400 by Sherrie Mustache, RN  Isolation Precautions: contact precautions maintained  Taken 01/27/2023 1200 by Sherrie Mustache, RN  Isolation Precautions: contact precautions maintained  Taken 01/27/2023 1000 by Sherrie Mustache, RN  Isolation Precautions: contact precautions maintained  Taken 01/27/2023 0800 by Sherrie Mustache, RN  Isolation Precautions: contact precautions maintained

## 2023-01-28 NOTE — Unmapped (Signed)
PULMONARY CONSULT  NOTE      Patient: Jordan [name]([birth year])  Reason for consultation: Jordan [name] is a 67 y.o. male who is seen in consultation at the request of Arman Filter, MD for comprehensive evaluation of acute exacerbation of bronchiectasis.    Assessment and Recommendations:      Principal Problem:    Acute exacerbation of bronchiectasis (CMS-HCC)  Active Problems:    Bronchiectasis (CMS-HCC)    Pseudomonas aeruginosa infection    Essential hypertension    Gastroesophageal reflux disease without esophagitis    Chronic respiratory failure with hypoxia (CMS-HCC)    Pulmonary aspergilloma (CMS-HCC)       Antibiotics:  Based on prior culture data (OPF; PsA) and allergy profile, favor treating with IV Zosyn, oral Cipro, and inhaled Colistin.  Would not place PICC line just yet as  he may simply need more aggressive airway clearance and prednisone to turn the corner.  Could then discharged on 14 days of Cipro and continue his 28 days of colistin.  Suspect this may be the case given that he is rhinovirus pos.  If doing well tomorrow, can go home to complete oral tx.    If he feels that he needs the IV antibiotics, would treat for at least 2 weeks with all 3 antibiotics.  Continue chronic azithromycin for anti-inflammatory effect.     Other medications:  While in hospital, substitute nebulized budesonide 0.5 mg bid and arformoterol bid for Symbicort and Spiriva.  Prednisone 40 mg daily x 5 days started 01/27/23     Airway clearance: Needs aggressive airway clearance 4 times per day.  Nebulized hypertonic saline 10%; pre-treatment with duonebs.  For mechanical clearance, prefer MetaNeb and Vibra lung.  Continue Pulmozyme nebs once a day       Labs:  CBC-D, CMP, CRP, ESR, sputum for bacterial and AFB cultures (NTM screening) sent from from clinic.  Weekly CRP and ESR to trend (including as outpatient).  [facility] Guideline for Monitoring Outpatient Intravenous and Oral Antimicrobial Therapy recommended monitoring labs ([email or web address])          Other Testing:  Patient does not need PFTs performed while in the hospital.     [facility]):  Patient is not a candidate for [facility] due to distance.     Dispo:   Patient has a follow-up appointment with me scheduled for 04/20/23.  Patient does not need to remain in the hospital for the entire course of IV antibiotics if elects to continue two weeks of Zosyn.   If he is discharging on IV Zosyn, please reach out to [facility] Bronchiectasis/NTM Care and [facility] team (myself, Alvino Blood RN, Eliberto Ivory CPP) prior to discharge to ensure that home infusion orders are correct and that we are comfortable with patient discharging home on IVs at that time.  We will follow monitoring labs.  Please use DOTPULMIVDISCHARGE for home infusion orders. We will follow monitoring labs. Does not need OPAT.  Do not discharge Friday through Sunday or on a holiday as no outpatient clinical staff available to assist if problems arise with home health/home infusion.        We appreciate the opportunity to assist in the care of this patient.  Please page [phone] with any questions.    Verneita Griffes, MD    Subjective:      History of Present Illness:  [name] is a 67 y.o. male admitted for bronchiectasis exacerbation.  Presented to clinic on  11/5 with worsening SOB over the past few days and was directly admitted for tx.      Interval Hx 11.7.24:  SOB improved.  Producing good sputum.  Having some R pleuritic chest pain intermittently today.     Review of Systems: A comprehensive review of systems was performed and was negative except as above in HPI  Past Medical History:   Diagnosis Date    Abscess of lung (CMS-HCC) 11/06/2010    CT Chest 10/22/10 12/05/2010 right thoracotomy resection of right middle lobe and resection of right lower lobe abscess     Arthritis     back    Biceps tendon tear 2013    left side    Bronchiectasis (CMS-HCC)     chronic psuedomonas infection    Degenerative joint disease of left acromioclavicular joint     GERD (gastroesophageal reflux disease)     Hypertension     Obstructive sleep apnea on CPAP 03/14/13    AHI 33.5, on BiPAP 12/8 based on sleep study 09/2013    Pneumonia 2012    Lung abcess     PONV (postoperative nausea and vomiting)     Prostate cancer (CMS-HCC) 09/09/2017    Rotator cuff injury left    Vertigo      Past Surgical History:   Procedure Laterality Date    APPENDECTOMY  1969    BACK SURGERY      BRONCHOSCOPY  10/19/2010    [facility]    CARPAL TUNNEL RELEASE  1999    IR EMBOLIZATION HEMORRHAGE ART OR VEN  LYMPHATIC EXTRAVASATION  07/11/2021    IR EMBOLIZATION HEMORRHAGE ART OR VEN  LYMPHATIC EXTRAVASATION 07/11/2021 Braulio Conte, MD IMG VIR H&V [facility]    LUNG REMOVAL, PARTIAL Right 10/2010    RML and RLL partially resected    MENISCECTOMY  2011    NECK SURGERY  1993, 1999, 2002    PR GERD TST W/ MUCOS IMPEDE ELECTROD,>1HR N/A 07/06/2012    Procedure: ESOPHAGEAL FUNCTION TEST, GASTROESOPHAGEAL REFLUX TEST W/ NASAL CATHETER INTRALUMINAL IMPEDANCE ELECTRODE(S) PLACEMENT, RECORDING, ANALYSIS AND INTERPRETATION; PROLONGED;  Surgeon: None None;  Location: [facility];  Service: Gastroenterology    PR OPEN TREAT RIB FRACTURE W/INT FIXATION, UNILATERAL, 1-2 RIBS Right 03/19/2015    Procedure: OPEN TREATMENT OF RIB FRACTURE REQUIRING INTERNAL FIXATION, UNILATERAL; 1-2 RIBS;  Surgeon: Evert Kohl, MD;  Location: MAIN OR [facility];  Service: Cardiothoracic    PR THORACOTOMY W/THERAP WEDGE RESEXN ADDL IPSILATRL Right 08/07/2013    Procedure: THORACOTOMY; WITH THERAPEUTIC LOBECTOMY OF RIGHT MIDDLE AND RIGHT LOWER LOBE RESECTION , EACH ADDITIONAL RESECTION, IPSILATERAL;  Surgeon: Alvester Chou, MD;  Location: MAIN OR [facility];  Service: Cardiothoracic    TONSILLECTOMY  1965     Medications reviewed in Epic  Allergies as of 01/26/2023 - Reviewed 01/26/2023   Allergen Reaction Noted    Gentamicin Other (See Comments) 07/28/2013    Tobramycin Other (See Comments) 11/18/2012    Mirtazapine Other (See Comments) 04/14/2022    Morphine Nausea And Vomiting 07/05/2012     Family History   Problem Relation Age of Onset    Alzheimer's disease Mother     Stroke Mother     Bronchiectasis  Brother         presumed    Bronchiectasis  Brother         presumed    Liver disease Father     Diabetes Father     Heart failure Father  Bronchiectasis  Daughter     Asthma Son         childhood asthma    Clotting disorder Neg Hx     Anesthesia problems Neg Hx      Social History     Tobacco Use    Smoking status: Never    Smokeless tobacco: Never   Substance Use Topics    Alcohol use: No     Alcohol/week: 0.0 standard drinks of alcohol        Objective:      Physical Exam:  Vitals:    01/28/23 0759 01/28/23 0850 01/28/23 0901 01/28/23 1015   BP: 131/68 125/70     Pulse: 73      Resp: 20      Temp: 35.8 ??C (96.4 ??F)      TempSrc: Temporal      SpO2: 93%  (!) 72% 95%   Weight:       Height:         General appearance - well appearing, well nourished, and in no distress  Heart - regular rate and rhythm, normal S1/S2, no gallops, rubs, or murmurs  Chest -absent breath sounds over the right middle and lower lobe, corresponding with prior resection. Good air movement globally.   Easy work of breathing without accessory muscle use.  Speaking in full sentences without difficulty.  Abdomen - +BS  Extremities - Warm, well perfused Clubbing of fingers.  No cyanosis.  Skin - normal coloration  Neurological - alert, oriented, normal speech, no focal findings or movement disorder noted  Psych:  CAM -    Malnutrition Assessment by RD:          Diagnostic Review:   All labs and images were personally reviewed.

## 2023-01-29 DIAGNOSIS — Z1211 Encounter for screening for malignant neoplasm of colon: Principal | ICD-10-CM

## 2023-01-29 LAB — BASIC METABOLIC PANEL
ANION GAP: 8 mmol/L (ref 5–14)
BLOOD UREA NITROGEN: 23 mg/dL (ref 9–23)
BUN / CREAT RATIO: 22
CALCIUM: 9.1 mg/dL (ref 8.7–10.4)
CHLORIDE: 104 mmol/L (ref 98–107)
CO2: 30.6 mmol/L (ref 20.0–31.0)
CREATININE: 1.05 mg/dL
EGFR CKD-EPI (2021) MALE: 77 mL/min/{1.73_m2} (ref >=60)
GLUCOSE RANDOM: 77 mg/dL (ref 70–179)
POTASSIUM: 3.9 mmol/L (ref 3.4–4.8)
SODIUM: 143 mmol/L (ref 135–145)

## 2023-01-29 MED ORDER — CIPROFLOXACIN 500 MG TABLET
ORAL_TABLET | Freq: Two times a day (BID) | ORAL | 0 refills | 13 days | Status: CP
Start: 2023-01-29 — End: 2023-02-11

## 2023-01-29 MED ADMIN — arformoterol (BROVANA) nebulizer solution 15 mcg/2 mL: 15 ug | RESPIRATORY_TRACT | @ 13:00:00 | Stop: 2023-01-29

## 2023-01-29 MED ADMIN — pantoprazole (Protonix) EC tablet 40 mg: 40 mg | ORAL | @ 13:00:00 | Stop: 2023-01-29

## 2023-01-29 MED ADMIN — ipratropium-albuterol (DUO-NEB) 0.5-2.5 mg/3 mL nebulizer solution 3 mL: 3 mL | RESPIRATORY_TRACT | @ 01:00:00

## 2023-01-29 MED ADMIN — sodium chloride 10 % NEBULIZER solution 5 mL: 5 mL | RESPIRATORY_TRACT | @ 01:00:00

## 2023-01-29 MED ADMIN — colistimethate (COLYMYCIN) 150 mg in sodium chloride (NS) 4 mL inhalation: 150 mg | RESPIRATORY_TRACT | @ 13:00:00 | Stop: 2023-01-29

## 2023-01-29 MED ADMIN — montelukast (SINGULAIR) tablet 10 mg: 10 mg | ORAL | @ 01:00:00

## 2023-01-29 MED ADMIN — piperacillin-tazobactam (ZOSYN) IVPB (premix) 4.5 g: 4.5 g | INTRAVENOUS | @ 17:00:00 | Stop: 2023-01-29

## 2023-01-29 MED ADMIN — ascorbic acid (vitamin C) (VITAMIN C) tablet 1,000 mg: 1000 mg | ORAL | @ 14:00:00 | Stop: 2023-01-29

## 2023-01-29 MED ADMIN — oxyCODONE (ROXICODONE) immediate release tablet 7.5 mg: 7.5 mg | ORAL | @ 13:00:00 | Stop: 2023-01-29

## 2023-01-29 MED ADMIN — ipratropium-albuterol (DUO-NEB) 0.5-2.5 mg/3 mL nebulizer solution 3 mL: 3 mL | RESPIRATORY_TRACT | @ 13:00:00 | Stop: 2023-01-29

## 2023-01-29 MED ADMIN — piperacillin-tazobactam (ZOSYN) IVPB (premix) 4.5 g: 4.5 g | INTRAVENOUS | @ 06:00:00 | Stop: 2023-01-29

## 2023-01-29 MED ADMIN — sodium chloride 10 % NEBULIZER solution 5 mL: 5 mL | RESPIRATORY_TRACT | @ 16:00:00 | Stop: 2023-01-29

## 2023-01-29 MED ADMIN — colistimethate (COLYMYCIN) 150 mg in sodium chloride (NS) 4 mL inhalation: 150 mg | RESPIRATORY_TRACT | @ 01:00:00

## 2023-01-29 MED ADMIN — azithromycin (ZITHROMAX) tablet 250 mg: 250 mg | ORAL | @ 13:00:00 | Stop: 2023-01-29

## 2023-01-29 MED ADMIN — dornase alfa (PULMOZYME) 1 mg/mL solution 2.5 mg: 2.5 mg | RESPIRATORY_TRACT | @ 13:00:00 | Stop: 2023-01-29

## 2023-01-29 MED ADMIN — budesonide (PULMICORT) nebulizer solution 0.5 mg: .5 mg | RESPIRATORY_TRACT | @ 13:00:00 | Stop: 2023-01-29

## 2023-01-29 MED ADMIN — atorvastatin (LIPITOR) tablet 10 mg: 10 mg | ORAL | @ 13:00:00 | Stop: 2023-01-29

## 2023-01-29 MED ADMIN — sodium chloride 10 % NEBULIZER solution 5 mL: 5 mL | RESPIRATORY_TRACT | @ 13:00:00 | Stop: 2023-01-29

## 2023-01-29 MED ADMIN — predniSONE (DELTASONE) tablet 40 mg: 40 mg | ORAL | @ 13:00:00 | Stop: 2023-01-29

## 2023-01-29 MED ADMIN — cholecalciferol (vitamin D3-10 mcg (400 unit)) tablet 50 mcg: 50 ug | ORAL | @ 13:00:00 | Stop: 2023-01-29

## 2023-01-29 MED ADMIN — ipratropium-albuterol (DUO-NEB) 0.5-2.5 mg/3 mL nebulizer solution 3 mL: 3 mL | RESPIRATORY_TRACT | @ 16:00:00 | Stop: 2023-01-29

## 2023-01-29 MED ADMIN — budesonide (PULMICORT) nebulizer solution 0.5 mg: .5 mg | RESPIRATORY_TRACT | @ 01:00:00

## 2023-01-29 MED ADMIN — dilTIAZem (CARDIZEM CD) 24 hr capsule 120 mg: 120 mg | ORAL | @ 13:00:00 | Stop: 2023-01-29

## 2023-01-29 MED ADMIN — piperacillin-tazobactam (ZOSYN) IVPB (premix) 4.5 g: 4.5 g | INTRAVENOUS | @ 11:00:00 | Stop: 2023-01-29

## 2023-01-29 MED ADMIN — oxyCODONE (ROXICODONE) immediate release tablet 7.5 mg: 7.5 mg | ORAL | @ 19:00:00 | Stop: 2023-01-29

## 2023-01-29 MED ADMIN — ciprofloxacin HCl (CIPRO) tablet 500 mg: 500 mg | ORAL | @ 11:00:00 | Stop: 2023-01-29

## 2023-01-29 MED ADMIN — oxyCODONE (ROXICODONE) immediate release tablet 7.5 mg: 7.5 mg | ORAL | @ 04:00:00 | Stop: 2023-02-09

## 2023-01-29 MED ADMIN — arformoterol (BROVANA) nebulizer solution 15 mcg/2 mL: 15 ug | RESPIRATORY_TRACT | @ 01:00:00

## 2023-01-29 NOTE — Unmapped (Signed)
Patient remains on room air. All scheduled treatments given with Metaneb  for airway clearance. Moderate thick yellow sputum obtained after tx

## 2023-01-29 NOTE — Unmapped (Addendum)
Hospital Medicine Daily Progress Note    Assessment/Plan:    Principal Problem:    Acute exacerbation of bronchiectasis (CMS-HCC)  Active Problems:    Bronchiectasis (CMS-HCC)    Pseudomonas aeruginosa infection    Essential hypertension    Gastroesophageal reflux disease without esophagitis    Chronic respiratory failure with hypoxia (CMS-HCC)    Pulmonary aspergilloma (CMS-HCC)                 Jordan [name] is a 67 y.o. male with history of bronchiectasis, pulmonary aspergillosis and MDR Pseudomonas infection, chronic hypoxemic respiratory failure that presented to [facility] with Acute exacerbation of bronchiectasis (CMS-HCC).    Acute on chronic bronchiectasis exacerbation due to rhinovirus infection  History of pulmonary aspergillosis  History of MDRO Pseudomonas aeruginosa respiratory infection  Chronic hypoxic respiratory failure  Presented to pulmonology clinic on 11/5 with progressive dyspnea, productive cough x 2 weeks. Does have prior history of hemoptysis s/p RUL bronchial artery embolization. Does not appear septic on presentation. CXR without acute infiltrate. Labs notable for elevated ESR 46 and RPP positive for rhinovirus/enterovirus. Pro-BNP elevated but no clinical signs of volume overload on exam, so low suspicion for CHF. He is not more hypoxemic than baseline 2L [place]. Suspect this acute illness has been triggered by rhinovirus. Case discussed with Pulmonology today 11/7  - Appreciate Pulmonology recommendations  - Follow up LRCx, AFB smear, pending. MRSA nares negative  - Continue IV Zosyn, PO Cipro, inhaled Colistin x 2-3 weeks (11/6 - ). Can likely discontinue IV Zosyn and continue only PO/inhaled antibiotics on discharge  - continue daily azithromycin  - Continue prednisone 40 mg daily x 5d (11/5-11/9)  - Airway clearance: DuoNebs + HTS 10% QID + Vibralung or MetaNeb  - Continue Pulmozyme daily  - Continue Singulair nightly  - Continue home supplemental O2 2L [place]  - Monitor for signs of recurrent hemoptysis; note he has previous history of embolization of right upper lobe vasculature     Secondary/Additional Active Problems:     Essential hypertension - Paroxysmal supraventricular tachycardia: Diltiazem     Hyperlipidemia: Continue atorvastatin for now     GERD: Protonix    Chronic pain: continue home oxycodone    FEN: Regular diet  VTE ppx: Low risk, ambulation  Code status: Full code,  HCDM: Jordan [name] - Spouse - [phone]  Dispo: home pending improvement in respiratory status and treatment course for antibiotics, EDD 11/8    ___________________________________________________________________    Subjective:  No acute events overnight. Feels that breathing is slowly improving, but not back to baseline. Denies feeling wheezy. Has had several coughing fits esp after doing airway clearance and has had some sharp pain in his right side/mid-axillary line with the coughing, an usual place for him to have pain (often has right posterior pain with bronchiectasis flares).     Labs/Studies:  Labs per EMR and Reviewed (last 24hrs)    Objective:  Temp:  [35.8 ??C (96.4 ??F)-36.1 ??C (96.9 ??F)] 35.8 ??C (96.4 ??F)  Heart Rate:  [59-104] 73  Resp:  [16-20] 20  BP: (125-131)/(68-79) 125/70  SpO2:  [72 %-98 %] 95 %    Gen: Sitting up in chair, talking on phone, in NAD  Eyes: EOMI, conjunctivae clear  ENT: MMM  CV: RRR, nl S1 S2, no m/r/g  Pulm: Normal WOB on [place]. CTA in left lung. Ongoing coarse crackles, decreased inspiratory squeaks and no wheezing in right lung  Abd: Soft, NTND, +BS  Ext: WWP, no  edema or cyanosis  Skin: No rashes or lesions  Neuro: A&O x 3, no focal deficits  Psych: Appropriate affect

## 2023-01-29 NOTE — Unmapped (Addendum)
Pt got admitted for acute exacerbation of bronchiectasis. Alert and oriented x 4. On [place] @ 2 LPM. Independent, Skin and fall precautions maintained. IV antibiotics administered and tolerated well. Pt ambulates to the bathroom independently. PRN med given for chronic pain and pain well controlled. Verbalized plan of care. Working towards prioritized goals.    24 hour chart check completed.     Problem: Adult Inpatient Plan of Care  Goal: Plan of Care Review  Outcome: Progressing     Problem: Adult Inpatient Plan of Care  Goal: Patient-Specific Goal (Individualized)  Outcome: Progressing     Problem: Adult Inpatient Plan of Care  Goal: Optimal Comfort and Wellbeing  Outcome: Progressing     Problem: Infection  Goal: Absence of Infection Signs and Symptoms  Outcome: Progressing  Intervention: Prevent or Manage Infection  Recent Flowsheet Documentation  Taken 01/28/2023 2000 by Lyndal Pulley, RN  Isolation Precautions: droplet precautions maintained     Problem: Fall Injury Risk  Goal: Absence of Fall and Fall-Related Injury  Outcome: Progressing  Intervention: Promote Scientist, clinical (histocompatibility and immunogenetics) Documentation  Taken 01/28/2023 2000 by Lyndal Pulley, RN  Safety Interventions:   fall reduction program maintained   low bed   nonskid shoes/slippers when out of bed

## 2023-01-29 NOTE — Unmapped (Signed)
PULMONARY CONSULT  NOTE      Patient: Jordan [name]([birth year])  Reason for consultation: [name] is a 67 y.o. male who is seen in consultation at the request of Arman Filter, MD for comprehensive evaluation of acute exacerbation of bronchiectasis.    Assessment and Recommendations:      Principal Problem:    Acute exacerbation of bronchiectasis (CMS-HCC)  Active Problems:    Bronchiectasis (CMS-HCC)    Pseudomonas aeruginosa infection    Essential hypertension    Gastroesophageal reflux disease without esophagitis    Chronic respiratory failure with hypoxia (CMS-HCC)    Pulmonary aspergilloma (CMS-HCC)       Antibiotics:  Based on prior culture data (OPF; PsA) and allergy profile, favor treating with IV Zosyn, oral Cipro, and inhaled Colistin.  Would not place PICC line just yet as  he may simply need more aggressive airway clearance and prednisone to turn the corner.  Could then discharged on 14 days of Cipro and continue his 28 days of colistin.  Suspect this may be the case given that he is rhinovirus pos.  Ok to go home today.  If he feels that he needs the IV antibiotics, would treat for at least 2 weeks with all 3 antibiotics.  Continue chronic azithromycin for anti-inflammatory effect.     Other medications:  While in hospital, substitute nebulized budesonide 0.5 mg bid and arformoterol bid for Symbicort and Spiriva.  Prednisone 40 mg daily x 5 days started 01/27/23     Airway clearance: Needs aggressive airway clearance 4 times per day.  Nebulized hypertonic saline 10%; pre-treatment with duonebs.  For mechanical clearance, prefer MetaNeb and Vibra lung.  Continue Pulmozyme nebs once a day       Labs:  CBC-D, CMP, CRP, ESR, sputum for bacterial and AFB cultures (NTM screening) sent from from clinic.  Weekly CRP and ESR to trend (including as outpatient).  [facility] Guideline for Monitoring Outpatient Intravenous and Oral Antimicrobial Therapy recommended monitoring labs ([email or web address])          Other Testing:  Patient does not need PFTs performed while in the hospital.     [facility]  Patient is not a candidate for [facility] due to distance.     Dispo:   Patient has a follow-up appointment with me scheduled for 04/20/23.  Patient does not need to remain in the hospital for the entire course of IV antibiotics if elects to continue two weeks of Zosyn.   If he is discharging on IV Zosyn, please reach out to [facility] Bronchiectasis/NTM Care and [facility] team (myself, Alvino Blood RN, Eliberto Ivory CPP) prior to discharge to ensure that home infusion orders are correct and that we are comfortable with patient discharging home on IVs at that time.  We will follow monitoring labs.  Please use DOTPULMIVDISCHARGE for home infusion orders. We will follow monitoring labs. Does not need OPAT.  Do not discharge Friday through Sunday or on a holiday as no outpatient clinical staff available to assist if problems arise with home health/home infusion.        We appreciate the opportunity to assist in the care of this patient.  Please page [phone] with any questions.    Verneita Griffes, MD    Subjective:      History of Present Illness:  [name] is a 67 y.o. male admitted for bronchiectasis exacerbation.  Presented to clinic on 11/5 with worsening SOB over the past few  days and was directly admitted for tx.      Interval Hx 11.7.24:  SOB improved.  Producing good sputum.  Ok to go home today.      Review of Systems: A comprehensive review of systems was performed and was negative except as above in HPI  Past Medical History:   Diagnosis Date    Abscess of lung (CMS-HCC) 11/06/2010    CT Chest 10/22/10 12/05/2010 right thoracotomy resection of right middle lobe and resection of right lower lobe abscess     Arthritis     back    Biceps tendon tear 2013    left side    Bronchiectasis (CMS-HCC)     chronic psuedomonas infection    Degenerative joint disease of left acromioclavicular joint     GERD (gastroesophageal reflux disease)     Hypertension     Obstructive sleep apnea on CPAP 03/14/13    AHI 33.5, on BiPAP 12/8 based on sleep study 09/2013    Pneumonia 2012    Lung abcess     PONV (postoperative nausea and vomiting)     Prostate cancer (CMS-HCC) 09/09/2017    Rotator cuff injury left    Vertigo      Past Surgical History:   Procedure Laterality Date    APPENDECTOMY  1969    BACK SURGERY      BRONCHOSCOPY  10/19/2010    [facility]    CARPAL TUNNEL RELEASE  1999    IR EMBOLIZATION HEMORRHAGE ART OR VEN  LYMPHATIC EXTRAVASATION  07/11/2021    IR EMBOLIZATION HEMORRHAGE ART OR VEN  LYMPHATIC EXTRAVASATION 07/11/2021 Braulio Conte, MD IMG VIR [facility]    LUNG REMOVAL, PARTIAL Right 10/2010    RML and RLL partially resected    MENISCECTOMY  2011    NECK SURGERY  1993, 1999, 2002    PR GERD TST W/ MUCOS IMPEDE ELECTROD,>1HR N/A 07/06/2012    Procedure: ESOPHAGEAL FUNCTION TEST, GASTROESOPHAGEAL REFLUX TEST W/ NASAL CATHETER INTRALUMINAL IMPEDANCE ELECTRODE(S) PLACEMENT, RECORDING, ANALYSIS AND INTERPRETATION; PROLONGED;  Surgeon: None None;  Location: [facility];  Service: Gastroenterology    PR OPEN TREAT RIB FRACTURE W/INT FIXATION, UNILATERAL, 1-2 RIBS Right 03/19/2015    Procedure: OPEN TREATMENT OF RIB FRACTURE REQUIRING INTERNAL FIXATION, UNILATERAL; 1-2 RIBS;  Surgeon: Evert Kohl, MD;  Location: MAIN OR [facility];  Service: Cardiothoracic    PR THORACOTOMY W/THERAP WEDGE RESEXN ADDL IPSILATRL Right 08/07/2013    Procedure: THORACOTOMY; WITH THERAPEUTIC LOBECTOMY OF RIGHT MIDDLE AND RIGHT LOWER LOBE RESECTION , EACH ADDITIONAL RESECTION, IPSILATERAL;  Surgeon: Alvester Chou, MD;  Location: MAIN OR [facility];  Service: Cardiothoracic TONSILLECTOMY  1965     Medications reviewed in Epic  Allergies as of 01/26/2023 - Reviewed 01/26/2023   Allergen Reaction Noted    Gentamicin Other (See Comments) 07/28/2013    Tobramycin Other (See Comments) 11/18/2012    Mirtazapine Other (See Comments) 04/14/2022    Morphine Nausea And Vomiting 07/05/2012     Family History   Problem Relation Age of Onset    Alzheimer's disease Mother     Stroke Mother     Bronchiectasis  Brother         presumed    Bronchiectasis  Brother         presumed    Liver disease Father     Diabetes Father     Heart failure Father     Bronchiectasis  Daughter     Asthma Son  childhood asthma    Clotting disorder Neg Hx     Anesthesia problems Neg Hx      Social History     Tobacco Use    Smoking status: Never    Smokeless tobacco: Never   Substance Use Topics    Alcohol use: No     Alcohol/week: 0.0 standard drinks of alcohol        Objective:      Physical Exam:  Vitals:    01/28/23 2017 01/28/23 2241 01/28/23 2245 01/29/23 0823   BP:   115/65 120/69   Pulse: 87  98 94   Resp: 20  18 18    Temp:   36.1 ??C (97 ??F)    TempSrc:  Temporal Oral    SpO2: 96% 95% 96% 98%   Weight:       Height:         General appearance - well appearing, well nourished, and in no distress  Heart - regular rate and rhythm, normal S1/S2, no gallops, rubs, or murmurs  Chest -absent breath sounds over the right middle and lower lobe, corresponding with prior resection. Good air movement globally.   Easy work of breathing without accessory muscle use.  Speaking in full sentences without difficulty.  Abdomen - +BS  Extremities - Warm, well perfused Clubbing of fingers.  No cyanosis.  Skin - normal coloration  Neurological - alert, oriented, normal speech, no focal findings or movement disorder noted  Psych:  CAM -    Malnutrition Assessment by RD:          Diagnostic Review:   All labs and images were personally reviewed.

## 2023-01-29 NOTE — Unmapped (Signed)
Pt is A&O x4. Reports chronic back pain that is improved with PRN oxycodone. Dyspnea with exertion, O2 sats maintained on O2 2 lpm n/c. IV antibiotics administered and tolerated well. Safety protocols maintained.   Problem: Adult Inpatient Plan of Care  Goal: Plan of Care Review  Outcome: Progressing  Goal: Patient-Specific Goal (Individualized)  Outcome: Progressing  Goal: Absence of Hospital-Acquired Illness or Injury  Outcome: Progressing  Intervention: Identify and Manage Fall Risk  Recent Flowsheet Documentation  Taken 01/28/2023 1600 by Dawna Part, RN  Safety Interventions:   fall reduction program maintained   low bed   nonskid shoes/slippers when out of bed  Taken 01/28/2023 1400 by Dawna Part, RN  Safety Interventions:   fall reduction program maintained   low bed   nonskid shoes/slippers when out of bed  Taken 01/28/2023 1200 by Dawna Part, RN  Safety Interventions:   fall reduction program maintained   low bed   nonskid shoes/slippers when out of bed  Taken 01/28/2023 1000 by Dawna Part, RN  Safety Interventions:   family at bedside   low bed   nonskid shoes/slippers when out of bed  Taken 01/28/2023 0800 by Dawna Part, RN  Safety Interventions:   fall reduction program maintained   low bed   nonskid shoes/slippers when out of bed  Intervention: Prevent Skin Injury  Recent Flowsheet Documentation  Taken 01/28/2023 1600 by Dawna Part, RN  Positioning for Skin: Sitting in Chair  Device Skin Pressure Protection: adhesive use limited  Skin Protection: adhesive use limited  Taken 01/28/2023 1400 by Dawna Part, RN  Positioning for Skin: Sitting in Chair  Device Skin Pressure Protection: adhesive use limited  Skin Protection: adhesive use limited  Taken 01/28/2023 1200 by Dawna Part, RN  Positioning for Skin: Sitting in Chair  Device Skin Pressure Protection: adhesive use limited  Skin Protection: adhesive use limited  Taken 01/28/2023 1000 by Dawna Part, RN  Positioning for Skin: Sitting in Chair  Device Skin Pressure Protection: adhesive use limited  Skin Protection: adhesive use limited  Taken 01/28/2023 0800 by Dawna Part, RN  Positioning for Skin: Sitting in Chair  Skin Protection: adhesive use limited  Intervention: Prevent Infection  Recent Flowsheet Documentation  Taken 01/28/2023 0800 by Dawna Part, RN  Infection Prevention:   hand hygiene promoted   single patient room provided  Goal: Optimal Comfort and Wellbeing  Outcome: Progressing  Goal: Readiness for Transition of Care  Outcome: Progressing  Goal: Rounds/Family Conference  Outcome: Progressing     Problem: Infection  Goal: Absence of Infection Signs and Symptoms  Outcome: Progressing  Intervention: Prevent or Manage Infection  Recent Flowsheet Documentation  Taken 01/28/2023 1600 by Dawna Part, RN  Isolation Precautions: droplet precautions maintained  Taken 01/28/2023 1400 by Dawna Part, RN  Isolation Precautions: droplet precautions maintained  Taken 01/28/2023 1200 by Dawna Part, RN  Isolation Precautions: droplet precautions maintained  Taken 01/28/2023 1000 by Dawna Part, RN  Isolation Precautions: droplet precautions maintained  Taken 01/28/2023 0800 by Dawna Part, RN  Infection Management: aseptic technique maintained  Isolation Precautions: droplet precautions maintained     Problem: Airway Clearance Ineffective  Goal: Effective Airway Clearance  Outcome: Progressing     Problem: Fall Injury Risk  Goal: Absence of Fall and Fall-Related Injury  Outcome: Progressing  Intervention: Promote Injury-Free Environment  Recent Flowsheet Documentation  Taken 01/28/2023 1600 by Dawna Part, RN  Safety Interventions:   fall reduction program maintained   low bed   nonskid shoes/slippers when  out of bed  Taken 01/28/2023 1400 by Dawna Part, RN  Safety Interventions:   fall reduction program maintained   low bed   nonskid shoes/slippers when out of bed  Taken 01/28/2023 1200 by Dawna Part, RN  Safety Interventions:   fall reduction program maintained   low bed   nonskid shoes/slippers when out of bed  Taken 01/28/2023 1000 by Dawna Part, RN  Safety Interventions:   family at bedside   low bed   nonskid shoes/slippers when out of bed  Taken 01/28/2023 0800 by Dawna Part, RN  Safety Interventions:   fall reduction program maintained   low bed   nonskid shoes/slippers when out of bed

## 2023-01-29 NOTE — Unmapped (Signed)
Physician Discharge Summary [facility]  [facility]  [address]  [place] [address]  Dept: [phone]  Loc: [phone]     Identifying Information:   Jordan [name]  [birth year]  [id number]    Primary Care Physician: Irving Copas, MD   Code Status: Full Code    Admit Date: 01/26/2023    Discharge Date: 01/29/2023     Discharge To: Home    Discharge Service: [facility] - HBB: Hospitalist Service #1     Discharge Attending Physician: Arman Filter, MD    Discharge Diagnoses:  Principal Problem:    Acute exacerbation of bronchiectasis (CMS-HCC) (POA: Yes)  Active Problems:    Bronchiectasis (CMS-HCC) (POA: Yes)    Pseudomonas aeruginosa infection (POA: Yes)    Essential hypertension (POA: Yes)    Gastroesophageal reflux disease without esophagitis (POA: Yes)    Chronic respiratory failure with hypoxia (CMS-HCC) (POA: Yes)    Pulmonary aspergilloma (CMS-HCC) (POA: Yes)  Resolved Problems:    * No resolved hospital problems. *      Outpatient Provider Follow Up Issues:   [  ] continue to monitor for improvement in bronchiectasis symptoms    Hospital Course:   Jamesen Scalone is a 67 year old man with history of chronic bronchiectasis with chronic hypoxemic respiratory failure, pulmonary aspergillosis and chronic MDR Pseudomonas colonization, HTN, HLD, GERD who presented with acute bronchiectasis exacerbation in setting of rhinovirus infection.     Chronic bronchiectasis with acute exacerbation due to rhinovirus infection  Hx pulmonary aspergillosis  Hx MDR Pseudomonas chronic colonization  Chronic hypoxemic respiratory failure  Presented to Pulmonology clinic 11/5 with progressive dyspnea, productive cough in setting of being without home Spiriva x 1 month, concerning for disruption of respiratory homeostasis vs bronchiectasis flare so was referred for direct admission for intensification of airway clearance and antibiotics. He was not septic and was requiring his home 2L [place]. Labs showed normal WBC but elevated ESR 46, normal CRP 8.0. Pro-BNP was elevated but he had no signs of volume overload on exam. CXR showed stable chronic changes in right apex consistent with volume loss. Respiratory pathogen panel was positive for rhinovirus/enterovirus. Lower respiratory and AFB cultures collected but pending by discharge. MRSA swab negative. Pulmonology consulted. He was treated with prednisone 40 mg daily x 5 d (11/5-11/9) and IV Zosyn, PO Cipro, and inhaled Colistin while admitted based on prior culture data; on discharge, he will continue the PO Cipro for total 14 day course (through 02/09/23) and the inhaled Colistin per his usual 28 days on/off schedule. For airway clearance, he was treated with DuoNebs + HTS 10% QID, budesonide/arformoterol nebs, Pulmozyme neb, and MetaNeb/Vibralung. His dyspnea improved throughout his hospital stay. He had some pleuritic right-sided chest pain but no concerning symptoms or hemopytysis. On discharge, he will resume his home Symbicort and albuterol and HTS nebs.     Essential hypertension - Paroxysmal supraventricular tachycardia: Diltiazem     Hyperlipidemia: Atorvastatin     GERD: Protonix     Chronic pain: continued oxycodone (formulary sub for home hydrocodone)    Procedures:  None  No admission procedures for hospital encounter.  ______________________________________________________________________  Discharge Medications:     Your Medication List        START taking these medications      ciprofloxacin HCl 500 MG tablet  Commonly known as: CIPRO  Take 1 tablet (500 mg total) by mouth two (2) times a day for 13 days. Next dose due evening of  01/29/23.     predniSONE 20 MG tablet  Commonly known as: DELTASONE  Take 2 tablets (40 mg total) by mouth in the morning for 1 day. Take on Saturday 01/30/23..  Start taking on: January 30, 2023            CHANGE how you take these medications      BREO ELLIPTA 100-25 mcg/dose inhaler  Generic drug: fluticasone furoate-vilanterol  Inhale 1 puff daily. Stop taking your Symbicort when you start the Breo.  What changed: additional instructions            CONTINUE taking these medications      albuterol 2.5 mg /3 mL (0.083 %) nebulizer solution  Inhale 3 mL by nebulization Three (3) times a day.     albuterol 90 mcg/actuation inhaler  Commonly known as: PROVENTIL HFA;VENTOLIN HFA  Inhale 2 puffs four (4) times a day as needed.     ALCOHOL PREP PADS Padm  Generic drug: alcohol swabs  Use as directed with inhaled antibiotics     ascorbic acid (vitamin C) 1000 MG tablet  Commonly known as: VITAMIN C  Take 1 tablet (1,000 mg total) by mouth daily.     atorvastatin 10 MG tablet  Commonly known as: LIPITOR  Take 1 tablet (10 mg total) by mouth daily.     azithromycin 250 MG tablet  Commonly known as: ZITHROMAX  TAKE 1 TABLET BY MOUTH EVERY DAY     BD POSIFLUSH NORMAL SALINE 0.9 injection  Generic drug: sodium chloride  Inject 2mL of 0.9%NaCl into colistin vial & gently mix. After withdrawing colistin dose, add an additional 1mL of 0.9%NaCl to neb cup with the colistin dose.     BD REGULAR BEVEL NEEDLES 21 gauge x 1 1/2 Ndle  Generic drug: needle (disp) 21 G  Use as directed with inhaled Colistin     budesonide-formoterol 80-4.5 mcg/actuation inhaler  Commonly known as: SYMBICORT  Inhale 2 puffs two (2) times a day.     CALCIUM 600 ORAL  Take 2 tablets by mouth two (2) times a day. 1 in morning and 1 in the evening     cholecalciferol (vitamin D3-50 mcg (2,000 unit)) 50 mcg (2,000 unit) tablet  Take 1 tablet (50 mcg total) by mouth daily.     colistimethate 150 mg injection  Commonly known as: COLYMYCIN  Inject 2 mL sterile water for injection to mix colistin vial, then draw up 2 mL (150mg ) and inhale 2 times a day, 28 days on and 28 days off.     dilTIAZem 120 MG 24 hr capsule  Commonly known as: CARDIZEM CD  Take 1 capsule (120 mg total) by mouth daily.     dornase alfa 1 mg/mL nebulizer solution  Commonly known as: PULMOZYME  Inhale 1 ampule (2.5 mg) daily. Use at least 30-60 minutes before airway clearance, or after airway clearance     empty container Misc  USE AS DIRECTED     empty container Misc  Commonly known as: sharps container  USE AS DIRECTED     empty container Misc  Use as directed to dispose of needles. When full, make sure lid is closed tightly then dispose of container in trash.     ferrous sulfate 325 (65 FE) MG tablet  Take 1 tablet (325 mg total) by mouth Two (2) times a day. BID     fluticasone propionate 50 mcg/actuation nasal spray  Commonly known as: FLONASE  1 spray into each nostril  daily.     HYDROcodone-acetaminophen 7.5-325 mg per tablet  Commonly known as: NORCO  Take 1 tablet by mouth three (3) times a day (at 6am, noon and 6pm).     LC PLUS Misc  Generic drug: nebulizers  Use with inhaled medications     LC PLUS Misc  Generic drug: nebulizers  use with nebulized medications     montelukast 10 mg tablet  Commonly known as: SINGULAIR  TAKE 1 TABLET BY MOUTH EVERY DAY AT NIGHT     multivitamin-minerals-lutein Tab  Take 1 tablet by mouth daily.     NON FORMULARY  Take 650 mg by mouth two (2) times a day. Brand: Citracal 650mg      omeprazole 20 MG capsule  Commonly known as: PriLOSEC  Take 1 capsule (20 mg total) by mouth Two (2) times a day.     OXYGEN-AIR DELIVERY SYSTEMS MISC  Inhale 2 mL.     sodium chloride 10 % Nebu  Measure and Inhale 5 mL by nebulization Two (2) times a day. Discard remaining amount and use a new vial for each dose.     SPIRIVA RESPIMAT 2.5 mcg/actuation inhalation mist  Generic drug: tiotropium bromide  Inhale 2 puffs  in the morning.     sterile water Soln  Use 2mL to mix Colistin, then add additional 2mL to neb cup with 2mL of mixed colistin. Inhale twice daily 28 days on and 28 days off.     syringe with needle 3 mL 20 gauge x 1 1/2 Syrg  Commonly known as: BD LUER-LOK SYRINGE  For use with inhaled antibiotic (Colistin)     BD LUER-LOK SYRINGE 3 mL 21 gauge x 1 Syrg  Generic drug: syringe with needle  For use with inhaled antibiotic (Colistin)     tobramycin (PF) 300 mg/5 mL nebulizer solution  Commonly known as: [name]  Inhale the contents of 1 ampule (300 mg total) by nebulization every twelve (12) hours. 28 days on and 28 days off.     zinc gluconate 50 mg (7 mg elemental zinc) tablet  Take 1 tablet (50 mg total) by mouth.     zoledronic acid-mannitol&water 5 mg/100 mL Pgbk  Commonly known as: RECLAST  Infusion              Allergies:  Gentamicin, Tobramycin, Mirtazapine, and Morphine  ______________________________________________________________________  Pending Test Results (if blank, then none):  Pending Labs       Order Current Status    Lower Respiratory Culture Preliminary result            Most Recent Labs:  All lab results last 24 hours -   Recent Results (from the past 24 hour(s))   Basic Metabolic Panel    Collection Time: 01/29/23  7:07 AM   Result Value Ref Range    Sodium 143 135 - 145 mmol/L    Potassium 3.9 3.4 - 4.8 mmol/L    Chloride 104 98 - 107 mmol/L    CO2 30.6 20.0 - 31.0 mmol/L    Anion Gap 8 5 - 14 mmol/L    BUN 23 9 - 23 mg/dL    Creatinine 3.87 5.64 - 1.18 mg/dL    BUN/Creatinine Ratio 22     eGFR CKD-EPI (2021) Male 77 >=60 mL/min/1.25m2    Glucose 77 70 - 179 mg/dL    Calcium 9.1 8.7 - 33.2 mg/dL     Microbiology -   Microbiology Results (last day)       **  No results found for the last 24 hours. **            Relevant Studies/Radiology (if blank, then none):  XR Chest Portable    Result Date: 01/26/2023  EXAM: XR CHEST PORTABLE DATE: 01/26/2023 4:12 PM ACCESSION: [id number] UN DICTATED: 01/26/2023 4:43 PM INTERPRETATION LOCATION: MAIN CAMPUS CLINICAL INDICATION: 67 year old male with hypoxemia. COMPARISON: August 17. TECHNIQUE: Single frontal view of the chest. FINDINGS: Stable chronic changes on the right with apical cap and ipsilateral shift of the trachea consistent with significant volume loss. Blunted right costophrenic angle with consolidation right base unchanged. Left lung grossly clear. No new findings.     No acute abnormalities.     ECG 12 Lead    Result Date: 01/26/2023  NORMAL SINUS RHYTHM NORMAL ECG WHEN COMPARED WITH ECG OF 10-Nov-2022 10:11, LEFT ANTERIOR FASCICULAR BLOCK IS NO LONGER PRESENT   ______________________________________________________________________  Discharge Instructions:     Diet Instructions       Discharge diet (specify)      Discharge Nutrition Therapy: Regular            Follow Up instructions and Outpatient Referrals     Call MD for:  difficulty breathing, headache or visual disturbances      Call MD for:  persistent dizziness or light-headedness      Call MD for:  severe uncontrolled pain      Call MD for: Temperature > 38.5 Celsius ( > 101.3 Fahrenheit)      Discharge instructions          Other Instructions       Call MD for:  difficulty breathing, headache or visual disturbances      Call MD for:  persistent dizziness or light-headedness      Call MD for:  severe uncontrolled pain      Call MD for: Temperature > 38.5 Celsius ( > 101.3 Fahrenheit)      Discharge instructions      You were admitted to the hospital due to difficulty breathing and increased cough and chest pain. You tested positive for Rhinovirus, the virus that causes the common cold. This likely caused your symptoms and may have caused a flare up or exacerbation of your bronchiectasis. You were treated with IV, oral, and inhaled antibiotics while you here as well as prednisone, a steroid or anti-inflammatory medicine, for your infection and bronchiectasis flare.     When you  go home, you will need to continue taking an oral antibiotic called ciprofloxacin (Cipro) for about 13 more days, with your next dose due tonight, 01/29/23. You should continue to take through 02/09/23. You should also keep taking prednisone by mouth for one more day, on Saturday, 01/30/23. You can go back to using your home inhalers and  nebulizers as usual. If you find that you still have some chest congestion, you can also use your albuterol and hypertonic saline up to four times a day until your breathing normalizes.    Discharge instructions to patient: Call your primary care doctor and make an appointment to see them:      Within 2 weeks from the time you are discharged from the hospital            Appointments which have been scheduled for you      Feb 15, 2023 12:20 PM  (Arrive by 12:05 PM)  RETURN  ENDOCRINE with Thompson Grayer, MD  [facility] DIABETES AND ENDOCRINOLOGY [place] [facility])  [address]  [facility] 1 through 4  [place] [address]  [phone]        Feb 22, 2023 11:00 AM  (Arrive by 10:35 AM)  RETURN  NEUROMUSCULAR with Di Kindle, ACNP  [facility] NEUROLOGY CLINIC [place] [facility] REGION) [address]  [place] [address]  [phone]        Apr 20, 2023 3:10 PM  (Arrive by 2:55 PM)  PFT with PFT 1  [facility] PULMONARY SPECIALTY FUNCT [place] [address]  [place] 1 through 4  [place] [address]  [phone]   If you have inhaled breathing medications, please do not use it 4 hours prior to this breathing test.  Please wear comfortable clothing and well-fitting , closed toe shoes in the even that a 6-minute walk is part of your test.  If you have fallen in the past month please inform your respiratory therapist at the start of your appointment.    While you may bring a guest to your appointment, they will not be able to be present in the testing area.         Apr 20, 2023 3:30 PM  (Arrive by 3:15 PM)  RETURN BRONCHIECTSIS with Truett Mainland, MD  [facility] PULMONARY SPECIALTY CL [place] [facility] REGION) [address]  [facility] 1 through 4  [place] [address]  [phone]        May 14, 2023 1:20 PM  (Arrive by 1:05 PM)  RETURN  ENDOCRINE with Thompson Grayer, MD  [facility] DIABETES AND ENDOCRINOLOGY [place] [facility] REGION) [address]  [facility] 1 through 4  [place] [address]  [phone]        Jul 19, 2023 2:40 PM  (Arrive by 2:25 PM)  RETURN CARDIOLOGY with Alvy Beal, MD  [facility] CARDIOLOGY [place] [facility] REGION) [address]  [facility] 1 through 4  [place] [address]  [phone]             ______________________________________________________________________  Discharge Day Services:  BP 120/69  - Pulse 94  - Temp 36.1 ??C (97 ??F) (Oral)  - Resp 18  - Ht 170.2 cm (5' 7)  - Wt 78.2 kg (172 lb 6.4 oz)  - SpO2 98%  - BMI 27.00 kg/m??   Pt seen on the day of discharge and determined appropriate for discharge.    Condition at Discharge: good    Length of Discharge: I spent greater than 30 mins in the discharge of this patient.

## 2023-01-30 MED ORDER — PREDNISONE 20 MG TABLET
ORAL_TABLET | Freq: Every day | ORAL | 0 refills | 1 days | Status: CP
Start: 2023-01-30 — End: 2023-01-31

## 2023-01-30 NOTE — Unmapped (Signed)
Care Management  Initial Transition Planning Assessment              General  Care Manager assessed the patient by : In person interview with patient, Medical record review, In person interview with family  Orientation Level: Oriented X4  Functional level prior to admission: Independent  Reason for referral: Discharge Planning    Contact/Decision Maker  Extended Emergency Contact Information  Primary Emergency Contact: Ellin Saba  Address: [address]           [place], [place] [address] [place]  Home Phone: [phone]  Mobile Phone: [phone]  Relation: Spouse    Legal Next of Kin / Guardian / POA / Advance Directives     HCDM (HCPOA): Sayd, Blandin - Spouse - [phone]    Advance Directive (Medical Treatment)  Does patient have an advance directive covering medical treatment?: Patient has advance directive covering medical treatment, copy in chart.  Advance directive covering medical treatment not in Chart:: Copy requested from family    Health Care Decision Maker [HCDM] (Medical & Mental Health Treatment)  Healthcare Decision Maker: HCDM documented in the HCDM/Contact Info section.  Information offered on HCDM, Medical & Mental Health advance directives:: Patient given information.    Advance Directive (Mental Health Treatment)  Does patient have an advance directive covering mental health treatment?: Patient does not have advance directive covering mental health treatment.  Reason patient does not have an advance directive covering mental health treatment:: Patient does not wish to complete one at this time.    Readmission Information    Have you been hospitalized in the last 30 days?: No                                Did the following happen with your discharge?                                                     Patient Information  Lives with: Spouse/significant other    Type of Residence: Private residence             Support Systems/Concerns: Spouse    Responsibilities/Dependents at home?: No    Home Care services in place prior to admission?: No          Outpatient/Community Resources in place prior to admission: Clinic       Equipment Currently Used at Home: respiratory supplies, cane, straight, walker, rolling  Current HME Agency (Name/Phone #): nebulizer, vest    Currently receiving outpatient dialysis?: No       Financial Information       Need for financial assistance?: No       Social Determinants of Health  Social Determinants of Health were addressed in provider documentation.  Please refer to patient history.  Social Determinants of Health     Food Insecurity: No Food Insecurity (11/09/2022)    Hunger Vital Sign     Worried About Running Out of Food in the Last Year: Never true     Ran Out of Food in the Last Year: Never true   Internet Connectivity: Not on file   Housing/Utilities: Low Risk  (11/09/2022)    Housing/Utilities     Within the past 12 months, have you ever stayed: outside, in a  car, in a tent, in an overnight shelter, or temporarily in someone else's home (i.e. couch-surfing)?: No     Are you worried about losing your housing?: No     Within the past 12 months, have you been unable to get utilities (heat, electricity) when it was really needed?: No   Tobacco Use: Low Risk  (01/27/2023)    Patient History     Smoking Tobacco Use: Never     Smokeless Tobacco Use: Never     Passive Exposure: Not on file   Transportation Needs: No Transportation Needs (11/09/2022)    PRAPARE - Transportation     Lack of Transportation (Medical): No     Lack of Transportation (Non-Medical): No   Alcohol Use: Not At Risk (01/22/2021)    Received from [facility], [facility]    AUDIT-C     Frequency of Alcohol Consumption: Never     Average Number of Drinks: Patient does not drink     Frequency of Binge Drinking: Never   Interpersonal Safety: Unknown (01/30/2023)    Interpersonal Safety     Unsafe Where You Currently Live: Not on file     Physically Hurt by Anyone: Not on file     Abused by Anyone: Not on file   Physical Activity: Inactive (01/22/2021)    Received from [facility]    Exercise Vital Sign     Days of Exercise per Week: 0 days     Minutes of Exercise per Session: 0 min   Intimate Partner Violence: Unknown (06/24/2021)    Received from [facility], [facility]    HITS     Physically Hurt: Not on file     Insult or Talk Down To: Not on file     Threaten Physical Harm: Not on file     Scream or Curse: Not on file   Stress: No Stress Concern Present (01/22/2021)    Received from [facility] - Occupational Stress Questionnaire     Feeling of Stress : Not at all   Substance Use: Not on file (01/30/2023)   Social Connections: Unknown (08/01/2021)    Received from [facility], [facility]    Social Network     Social Network: Not on file   Financial Resource Strain: Low Risk  (11/09/2022)    Overall Financial Resource Strain (CARDIA)     Difficulty of Paying Living Expenses: Not hard at all   Depression: Not at risk (06/26/2020)    Received from [facility], [facility]    Depression     Depression Screening: 0   Health Literacy: Not on file       Complex Discharge Information    Is patient identified as a difficult/complex discharge?: No    Discharge Needs Assessment  Concerns to be Addressed: other (see comments) (cultures pending to determine need for IVA on dc.)    Clinical Risk Factors: Multiple Diagnoses (Chronic)    Barriers to taking medications: No    Prior overnight hospital stay or ED visit in last 90 days: No    Anticipated Changes Related to Illness: none    Equipment Needed After Discharge: none    Discharge Facility/Level of Care Needs: other (see comments) (home with self care vs. home health)    Readmission  Risk of Unplanned Readmission Score: UNPLANNED READMISSION SCORE: 13.64%  Predictive Model Details  14% (Medium)  Factor Value    Calculated 01/29/2023 12:00 36% Number of active inpatient medication orders 41    [place] Risk of Unplanned Readmission Model 12% ECG/EKG order present in last 6 months    *Archived Data 10% Encounter of ten days or longer in last year present     8% Imaging order present in last 6 months     7% Age 67     6% Number of hospitalizations in last year 1     6% Diagnosis of deficiency anemia present     6% Active corticosteroid inpatient medication order present     4% Current length of stay 2.945 days     3% Charlson Comorbidity Index 2     3% Future appointment scheduled     1% Active ulcer inpatient medication order present      Readmitted Within the Last 30 Days? (No if blank)   Patient at risk for readmission?: No    Discharge Plan  Screen findings are: Discharge planning needs identified or anticipated (Comment). (patient agreeable to home infusion if needed)    Expected Discharge Date: 01/29/2023    Expected Transfer from Critical Care:      Quality data for continuing care services shared with patient and/or representative?: Yes  Patient and/or family were provided with choice of facilities / services that are available and appropriate to meet post hospital care needs?: Yes       Initial Assessment complete?: Yes

## 2023-01-31 DIAGNOSIS — J471 Bronchiectasis with (acute) exacerbation: Secondary | ICD-10-CM | POA: Diagnosis not present

## 2023-02-05 MED ORDER — PEG 3350-ELECTROLYTES 236 GRAM-22.74 GRAM-6.74 GRAM-5.86 GRAM SOLUTION
0 refills | 0 days | Status: CP
Start: 2023-02-05 — End: ?
  Filled 2023-03-25: qty 900, 30d supply, fill #2

## 2023-02-05 NOTE — Unmapped (Signed)
Colonoscopy  Procedure #1     Procedure #2   [id number]  MRN     Endoscopist     Is the patient's health insurance [employer], [employer], [name]), UHC [place], [employer], or [place]?     Urgent procedure     Are you pregnant?     Are you in the process of scheduling or awaiting results of a heart ultrasound, stress test, or catheterization to evaluate new or worsening chest pain, dizziness, or shortness of breath?     Do you take: Plavix (clopidogrel), Coumadin (warfarin), Lovenox (enoxaparin), Pradaxa (dabigatran), Effient (prasugrel), Xarelto (rivaroxaban), Eliquis (apixaban), Pletal (cilostazol), or Brilinta (ticagrelor)?          Did ordering provider indicate how long to hold this medication in the order comments?          Which of the above medications are you taking?          What is the name of the medical practice that manages this medication?          What is the name of the medical provider who manages this medication?     Do you have hemophilia, von Willebrand disease, or low platelets?     Do you have a pacemaker or implanted cardiac defibrillator?     Has a [place] [facility] provider specified the location(s)?     Which location(s) did the [facility] provider specify?          [facility]          [place]          [facility]     Do you see a liver specialist for chronic liver disease?     Is the procedure indication for variceal screening?     Is procedure indication for variceal banding (this does NOT include variceal screening)?     Have you had a heart attack, stroke or heart stent placement within the past 6 months?     Month of event     Year of event (ONLY ENTER LAST 2 DIGITS)        5  Height (feet)   7  Height (inches)   172  Weight (pounds)   26.9  BMI          Did the ordering provider specify a bowel prep?          What bowel prep was specified?     Do you have an ostomy (bag on your stomach that collects your stool)?          Is it an ileostomy?          Is it a colostomy? Patient doesn't know.     Do you have chronic kidney disease?     Do you have chronic constipation or have you had poor quality bowel preps for past colonoscopies?     Do you have Crohn's disease or ulcerative colitis?     Have you had weight loss surgery?          When you walk around your house or grocery store, do you have to stop and rest due to shortness of breath, chest pain, or light-headedness?   TRUE  Do you ever use supplemental oxygen?     Have you been hospitalized for cirrhosis of the liver or heart failure in the last 12 months?     Have you been treated for mouth or throat cancer with radiation or surgery?  Have you been told that it is difficult for doctors to insert a breathing tube in you during anesthesia?     Have you had a heart or lung transplant?          Are you on dialysis?     Do you have cirrhosis of the liver?     Do you have myasthenia gravis?     Is the patient a prisoner?   ################# ## ###################################################################################################################   MRN:  [id number]   Anticoag Review  No   Nurse Triage  No   GI clinic consult  No   Procedure(s):  Colonoscopy     0   Endoscopist:  0   Urgent:  No   Prep:  Nulytely Prep                  --------------------------- --- ----------------------------------------------------------------------------------------------------------------------------------------------------------------------------   [facility]:  [facility]                  Requested Locations:              ################# ## ###################################################################################################################

## 2023-02-08 ENCOUNTER — Encounter
Admit: 2023-02-08 | Discharge: 2023-02-08 | Payer: PRIVATE HEALTH INSURANCE | Attending: Anesthesiology | Primary: Anesthesiology

## 2023-02-08 ENCOUNTER — Ambulatory Visit: Admit: 2023-02-08 | Discharge: 2023-02-08 | Payer: PRIVATE HEALTH INSURANCE

## 2023-02-08 DIAGNOSIS — K648 Other hemorrhoids: Secondary | ICD-10-CM | POA: Diagnosis not present

## 2023-02-08 DIAGNOSIS — K635 Polyp of colon: Secondary | ICD-10-CM | POA: Diagnosis not present

## 2023-02-08 DIAGNOSIS — D127 Benign neoplasm of rectosigmoid junction: Secondary | ICD-10-CM | POA: Diagnosis not present

## 2023-02-08 DIAGNOSIS — K5730 Diverticulosis of large intestine without perforation or abscess without bleeding: Secondary | ICD-10-CM | POA: Diagnosis not present

## 2023-02-08 DIAGNOSIS — Z860100 Personal history of colon polyps, unspecified: Secondary | ICD-10-CM | POA: Diagnosis not present

## 2023-02-08 DIAGNOSIS — Z1211 Encounter for screening for malignant neoplasm of colon: Secondary | ICD-10-CM | POA: Diagnosis not present

## 2023-02-08 DIAGNOSIS — D123 Benign neoplasm of transverse colon: Secondary | ICD-10-CM | POA: Diagnosis not present

## 2023-02-08 DIAGNOSIS — Z8546 Personal history of malignant neoplasm of prostate: Secondary | ICD-10-CM | POA: Diagnosis not present

## 2023-02-08 DIAGNOSIS — G8929 Other chronic pain: Secondary | ICD-10-CM | POA: Diagnosis not present

## 2023-02-08 DIAGNOSIS — Z9989 Dependence on other enabling machines and devices: Secondary | ICD-10-CM | POA: Diagnosis not present

## 2023-02-08 DIAGNOSIS — I10 Essential (primary) hypertension: Secondary | ICD-10-CM | POA: Diagnosis not present

## 2023-02-08 DIAGNOSIS — D122 Benign neoplasm of ascending colon: Secondary | ICD-10-CM | POA: Diagnosis not present

## 2023-02-08 DIAGNOSIS — K219 Gastro-esophageal reflux disease without esophagitis: Secondary | ICD-10-CM | POA: Diagnosis not present

## 2023-02-08 DIAGNOSIS — K649 Unspecified hemorrhoids: Secondary | ICD-10-CM | POA: Diagnosis not present

## 2023-02-08 DIAGNOSIS — G4733 Obstructive sleep apnea (adult) (pediatric): Secondary | ICD-10-CM | POA: Diagnosis not present

## 2023-02-08 DIAGNOSIS — Z7951 Long term (current) use of inhaled steroids: Secondary | ICD-10-CM | POA: Diagnosis not present

## 2023-02-08 DIAGNOSIS — Z79899 Other long term (current) drug therapy: Secondary | ICD-10-CM | POA: Diagnosis not present

## 2023-02-08 DIAGNOSIS — D126 Benign neoplasm of colon, unspecified: Secondary | ICD-10-CM | POA: Diagnosis not present

## 2023-02-08 DIAGNOSIS — D125 Benign neoplasm of sigmoid colon: Secondary | ICD-10-CM | POA: Diagnosis not present

## 2023-02-08 MED ADMIN — phenylephrine 1 mg/10 mL (100 mcg/mL) injection Syrg: INTRAVENOUS | @ 20:00:00 | Stop: 2023-02-08

## 2023-02-08 MED ADMIN — dexAMETHasone (DECADRON) 4 mg/mL injection: INTRAVENOUS | @ 20:00:00 | Stop: 2023-02-08

## 2023-02-08 MED ADMIN — albuterol 2.5 mg /3 mL (0.083 %) nebulizer solution 2.5 mg: 2.5 mg | RESPIRATORY_TRACT | @ 20:00:00 | Stop: 2023-02-08

## 2023-02-08 MED ADMIN — sodium chloride (NS) 0.9 % infusion: INTRAVENOUS | @ 20:00:00 | Stop: 2023-02-08

## 2023-02-08 MED ADMIN — lidocaine (PF) (XYLOCAINE-MPF) 20 mg/mL (2 %) injection: INTRAVENOUS | @ 20:00:00 | Stop: 2023-02-08

## 2023-02-08 MED ADMIN — propofol (DIPRIVAN) infusion 10 mg/mL: INTRAVENOUS | @ 20:00:00 | Stop: 2023-02-08

## 2023-02-08 MED ADMIN — phenylephrine 1 mg/10 mL (100 mcg/mL) injection Syrg: INTRAVENOUS | @ 21:00:00 | Stop: 2023-02-08

## 2023-02-08 MED ADMIN — ondansetron (ZOFRAN) injection: INTRAVENOUS | @ 20:00:00 | Stop: 2023-02-08

## 2023-02-08 MED ADMIN — Propofol (DIPRIVAN) injection: INTRAVENOUS | @ 20:00:00 | Stop: 2023-02-08

## 2023-02-10 NOTE — Unmapped (Signed)
Sent letter

## 2023-02-12 DIAGNOSIS — J471 Bronchiectasis with (acute) exacerbation: Secondary | ICD-10-CM | POA: Diagnosis not present

## 2023-02-13 DIAGNOSIS — J479 Bronchiectasis, uncomplicated: Principal | ICD-10-CM

## 2023-02-13 DIAGNOSIS — J3089 Other allergic rhinitis: Principal | ICD-10-CM

## 2023-02-13 MED ORDER — MONTELUKAST 10 MG TABLET
ORAL_TABLET | Freq: Every evening | ORAL | 3 refills | 0 days
Start: 2023-02-13 — End: ?

## 2023-02-13 MED ORDER — AZITHROMYCIN 250 MG TABLET
ORAL_TABLET | Freq: Every day | ORAL | 3 refills | 0 days
Start: 2023-02-13 — End: ?

## 2023-02-15 ENCOUNTER — Ambulatory Visit
Admit: 2023-02-15 | Discharge: 2023-02-16 | Payer: PRIVATE HEALTH INSURANCE | Attending: "Endocrinology | Primary: "Endocrinology

## 2023-02-15 DIAGNOSIS — M818 Other osteoporosis without current pathological fracture: Secondary | ICD-10-CM | POA: Diagnosis not present

## 2023-02-15 DIAGNOSIS — M810 Age-related osteoporosis without current pathological fracture: Secondary | ICD-10-CM | POA: Diagnosis not present

## 2023-02-15 MED ORDER — AZITHROMYCIN 250 MG TABLET
ORAL_TABLET | Freq: Every day | ORAL | 3 refills | 90 days | Status: CP
Start: 2023-02-15 — End: ?

## 2023-02-15 MED ORDER — MONTELUKAST 10 MG TABLET
ORAL_TABLET | Freq: Every evening | ORAL | 3 refills | 90 days | Status: CP
Start: 2023-02-15 — End: ?

## 2023-02-15 MED ORDER — NEBULIZERS
Freq: Every day | 3 refills | 0 days
Start: 2023-02-15 — End: ?

## 2023-02-15 NOTE — Unmapped (Signed)
Assessment/Plan:   The patient has osteoporosis at the hip. No fracture history. The patient has the following risk factors for osteoporosis: chronic glucocorticoid use. Differential diagnosis includes primary vs secondary causes of osteoporosis. Prior lab evaluation showed normal PTH, TSH, creatinine, Lfts, vitamin D levels.     We discussed the importance of adequate calcium intake (~1200 mg daily) and he currently meets this requirement. Discussed weight bearing exercise for bone health. Vit D level ordered.      Medical management was discussed and includes continuing alendronate vs switching to reclast. He was on alendronate now for 1.5 years. His DEXA in 2023 was stable compared to 1 year prior and he has trouble swallowing the pills. Thus we preferred to switch to reclast. He received this on 02/26/2022 and tolerated treatment well. Will continue current regimen this year and rx sent. Repeat DEXA in 01/2024. Plan to complete at least 3 doses.     All questions were answered and patient agrees with plan.     Return in about 1 year (around 02/15/2024).      Thompson Grayer, MD  [facility] at [facility]  Phone: [phone]   Fax: [phone]    This note was completed with the assistance of voice recognition software.  Please excuse grammatical errors.          Subjective:     History of Present Illness:  Jordan [name] is a 67 year old male with history of HTN, bronchiectasis, pulmonary aspergillosis, GERD, memory loss, dizziness, hemoptysis here for follow up for osteoporosis. Last seen in 04/2022. History was obtained from the patient and review of the electronic medical record.     Diagnosis: 07/2020. Was on steroids for many years for his lung conditions. Now off oral steroids. He is on symbicort. Has lost 3-4 inches in height. Was previously followed by endocrinology locally in [facility] and wants to have his doctors under one roof. Has been on alendronate 70 mg weekly but had trouble swallowing pills. DEXA was stable on treatment. We switched to reclast and he received his first dose 01/29/2022. Tolerated this well.     Today, he reports feeling well. Recently admitted for acute exacerbation of bronchiectasis. Denies jaw, hip pain. Denies interim falls, fracture, kidney stone. No dental work planned.     DEXA history: see below    Fragility fractures: no  Other fractures history: no    Family history of osteoporosis or hip fracture: no    History of:  Thyroid disease: no  Elevated calcium levels: no  Multiple Myeloma: no  Cancer: no  Heartburn or reflux: yes but well controlled on omeprazole 20 mg daily  Kidney stones: yes, many years ago.   CVD: no. Has dilated aorta and on cardizem.   Radiation treatment: no    Dental work coming up? No, sees dentist every 4 months.     Exercise: on oxygen.     Diet: yogurt daily.   ETOH: no  Tobacco: no    Supplements:   Calcium: citracal 650 mg twice a day.   Vitamin D: 2000 units daily.   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  Objective:     Physical Exam:  BP 126/67  - Pulse 94  - Ht 170.2 cm (5' 7)  - Wt 78.4 kg (172 lb 12.8 oz)  - BMI 27.06 kg/m??   General appearance - alert, well appearing, and in no distress, wears nasal cannula.   Spine: nontender to palpation. No instability.    Wt Readings from Last 6 Encounters:   02/15/23 78.4 kg (172 lb 12.8 oz)   02/08/23 74.4 kg (164 lb)   01/26/23 78.2 kg (172 lb 6.4 oz)   01/26/23 78.5 kg (173 lb)   01/11/23 78 kg (172 lb)   11/07/22 78.4 kg (172 lb 12.8 oz)       Lab Review:    Lab Results   Component Value Date    CALCIUM 9.1 01/29/2023    CALCIUM 9.1 01/28/2023    CALCIUM 9.3 01/27/2023    PHOS 3.7 01/22/2016    PHOS 4.1 06/23/2013    PHOS 4.0 06/21/2013    CREATININE 1.05 01/29/2023    CREATININE 1.15 01/28/2023    CREATININE 0.98 01/27/2023    VITDTOTAL 50.0 01/13/2022    ALBUMIN 3.5 01/26/2023    ALBUMIN 3.4 11/07/2022    ALBUMIN 3.7 10/14/2021    ALBUMIN 4.0 10/14/2021       TSH (uIU/mL)   Date Value   10/14/2021 1.334     Free T4 (ng/dL)   Date Value   54/11/8117 1.19     Imaging:   Impression      1.WHO classification: OSTEOPOROSIS.        Narrative   EXAM: DEXA BONE DENSITY SKELETAL   DATE: 02/03/2022 9:51 AM   ACCESSION: [id number]   DICTATED: 02/03/2022 12:59 PM   INTERPRETATION LOCATION: MAIN CAMPUS      CLINICAL INDICATION: 67 years old Male with osteoporosis with history of chronic glucocorticoid use. Has been on fosamax x 1.5 years. Please evaluate bone density.  - M81.0 - Osteoporosis, unspecified osteoporosis type, unspecified pathological fracture presence        COMPARISON: None.      TECHNIQUE: Bone mineral density was performed using the Horizon A bone densitometer.  The results of the study are expressed in bone mineral density (BMD) and interpreted using World Health Organization [facility] Hights) criteria, per ISCD positions.      FINDINGS:      Lumbar Spine (L1-L4)       Excluded Levels: None  excluded.     BMD: 1.186 (g/cm)       T-score: 0.9     WHO classification: NORMAL BONE MINERAL DENSITY.        Left Hip       Femoral Neck:           BMD: 0.590 (g/cm)           T-score: -2.5     Total Hip           BMD: 0.699 (g/cm)             T-score: -2.2     WHO classification: OSTEOPOROSIS.      Notes:       Lumbar Spine Comparison:    Note that levels L1-L4 are used for comparison purposes and any excluded levels (if applicable) may be included in the current and prior comparison BMD calculations.     Hip T-Score:  For the measurement of the hip T-score, the lowest of either the Total Hip or Femoral Neck BMD values is used.  Secondary causes of bone loss should be evaluated if clinically indicated since the etiology of low BMD cannot be determined by BMD measurement alone.

## 2023-02-15 NOTE — Unmapped (Signed)
[facility] Refill Coordination Note    Specialty Medication(s) to be Shipped:   CF/Pulmonary/Asthma: -Generic inhaled tobramycin 300mg /70mL inhalation solution    Other medication(s) to be shipped:  Jordan [name] Plus     Jordan [name], DOB: 1956/02/03  Phone: [phone] (home)       All above HIPAA information was verified with patient.     Was a Nurse, learning disability used for this call? No    Completed refill call assessment today to schedule patient's medication shipment from the [facility]  [phone]).  All relevant notes have been reviewed.     Specialty medication(s) and dose(s) confirmed: Regimen is correct and unchanged.   Changes to medications: Jordan [name] reports no changes at this time.  Changes to insurance: No  New side effects reported not previously addressed with a pharmacist or physician: None reported  Questions for the pharmacist: No    Confirmed patient received a Conservation officer, historic buildings and a Surveyor, mining with first shipment. The patient will receive a drug information handout for each medication shipped and additional FDA Medication Guides as required.       DISEASE/MEDICATION-SPECIFIC INFORMATION        For CF patients: CF [facility] Active? No-not enrolled    SPECIALTY MEDICATION ADHERENCE     Medication Adherence    Patient reported X missed doses in the last month: 0  Specialty Medication: tobramycin (PF) 300 mg/5 mL nebulizer solution ([name])  Patient is on additional specialty medications: No  Patient is on more than two specialty medications: No  Any gaps in refill history greater than 2 weeks in the last 3 months: no  Demonstrates understanding of importance of adherence: yes  Informant: patient  Reliability of informant: reliable  Provider-estimated medication adherence level: good  Patient is at risk for Non-Adherence: No  Reasons for non-adherence: no problems identified  Support network for adherence: family member Were doses missed due to medication being on hold? No    tobramycin (PF) 300 mg/5 mL nebulizer solution ([name])  : 0 doses of medicine on hand Off cycle      REFERRAL TO PHARMACIST     Referral to the pharmacist: Not needed      [facility]     Shipping address confirmed in Epic.       Delivery Scheduled: Yes, Expected medication delivery date: 02/17/23.     Medication will be delivered via UPS to the prescription address in Epic WAM.    [name]  Specialty Technician

## 2023-02-15 NOTE — Unmapped (Signed)
Please call the infusion center if you have not heard from them within 2 weeks for your appointment. The number is: [phone].     Lab today.

## 2023-02-16 DIAGNOSIS — G4733 Obstructive sleep apnea (adult) (pediatric): Secondary | ICD-10-CM | POA: Diagnosis not present

## 2023-02-16 DIAGNOSIS — J449 Chronic obstructive pulmonary disease, unspecified: Secondary | ICD-10-CM | POA: Diagnosis not present

## 2023-02-16 DIAGNOSIS — J479 Bronchiectasis, uncomplicated: Secondary | ICD-10-CM | POA: Diagnosis not present

## 2023-02-16 MED FILL — TOBRAMYCIN 300 MG/5 ML IN 0.225 % SODIUM CHLORIDE FOR NEBULIZATION: RESPIRATORY_TRACT | 28 days supply | Qty: 280 | Fill #1

## 2023-02-16 MED FILL — LC PLUS MISC: 28 days supply | Qty: 1 | Fill #0

## 2023-02-18 LAB — VITAMIN D 25 HYDROXY: VITAMIN D, TOTAL (25OH): 55.9 ng/mL (ref 20.0–80.0)

## 2023-02-22 ENCOUNTER — Ambulatory Visit
Admit: 2023-02-22 | Discharge: 2023-02-23 | Payer: PRIVATE HEALTH INSURANCE | Attending: Acute Care | Primary: Acute Care

## 2023-02-22 DIAGNOSIS — G629 Polyneuropathy, unspecified: Secondary | ICD-10-CM | POA: Diagnosis not present

## 2023-02-22 DIAGNOSIS — G4733 Obstructive sleep apnea (adult) (pediatric): Secondary | ICD-10-CM | POA: Diagnosis not present

## 2023-02-22 DIAGNOSIS — J471 Bronchiectasis with (acute) exacerbation: Secondary | ICD-10-CM | POA: Diagnosis not present

## 2023-02-22 DIAGNOSIS — J479 Bronchiectasis, uncomplicated: Secondary | ICD-10-CM | POA: Diagnosis not present

## 2023-02-22 NOTE — Unmapped (Signed)
You were evaluated today in the outpatient neurology clinic for your neuropathy.    Plan:  - Continue monitoring your neuropathy symptoms (numbness and tingling) and let us know if these get worse  - Be as active as you can as this can help with neuropathy symptoms - even walking is great  - Follow up in 1 year     Please schedule a follow up visit in 1 year or sooner if needed    Thank you for allowing me to participate in your care.    Johnney Ou, ACNP  Neuromuscular Division

## 2023-02-22 NOTE — Unmapped (Signed)
Outpatient Neurology Consult Note     MMNT [address]  [facility] [address] [place]  [address]  [place] [address]  [phone]    Date: 02/22/23  Patient Name: Jordan [name]   MRN: [id number]  PCP: Irving Copas, MD    ASSESSMENT       [name] is a 67 year old man who has paresthesias in his feet in the setting of antifungal medication (voriconazole) although discontinuation of this medication has not necessarily taking away his neuropathic symptoms.  Component of superimposed lumbosacral radiculopathy cannot be excluded.    Today, his neuropathy symptoms are stable. His objective exam is stable as well.          PLAN       - Continue monitoring symptoms and let us know if new symptoms occur or any worsening of current symptoms  - Consider repeating EMG for new or worsening symptoms at next visit  - No pain medications needed at this time  - Follow up in 1 year or sooner if needed     I personally spent 20 minutes face-to-face and non-face-to-face in the care of this patient, which includes all pre, intra, and post visit time on the date of service.  All documented time was specific to the E/M visit and does not include any procedures that may have been performed.    Johnney Ou, MSN, AGACNP-BC  [facility]  Neuromuscular Division         HISTORY     Chief Concern: Jordan [name] is a 67 y.o. man who is seen for follow-up of peripheral neuropathy.    Interval History 02/22/2023:  Since his last visit, patient feels like his neuropathy symptoms are stable. He continues to have numbness and tingling in his feet, mainly in the toes and left foot more than right. He does report that he continues to have intermittent swelling in the right foot but it goes away during the day. He denies any pain. He denies any weakness in the feet. No falls and no balance issues. He does not require any assistive devices.     History of Present Illness: Since him last visit, he feels that the neuropathy symptoms have worsened.  The bottom of his feet are numb.  Numbness now a sense up to halfway up to her lower leg, almost to the knee.  Sometimes at nighttime there is pain which wakes him up.  Initially, it was thought that his neuropathy may have been caused by the antifungal medication voriconazole but he discontinued this in August 2023 and his symptoms are still getting worse.  Swelling has subsided however.  He thinks that the swelling was related to mirtazapine.  He has occasional difficulties with balance that he gets up too quickly.  He has been on hydrocodone for low back issues for many years.  He has had 3 low back and 3 neck surgeries.  He is also being treated for bronchiectasis and is status post right lower lobe lobectomy.        Other than as noted above and in the chart, there is no change in the past medical, surgical, family, or social history since the last visit.    Past Medical History: He  has a past medical history of Abscess of lung (CMS-HCC) (11/06/2010), Arthritis, Biceps tendon tear (2013), Bronchiectasis (CMS-HCC), Degenerative joint disease of left acromioclavicular joint, GERD (gastroesophageal reflux disease), Hypertension, Obstructive sleep apnea on CPAP (03/14/13),  Pneumonia (2012), PONV (postoperative nausea and vomiting), Prostate cancer (CMS-HCC) (09/09/2017), Rotator cuff injury (left), and Vertigo.    Medications:   He is on the following medications: has a current medication list which includes the following prescription(s): albuterol, albuterol, alcohol swabs, ascorbic acid (vitamin c), atorvastatin, azithromycin, budesonide-formoterol, calcium carbonate, cholecalciferol (vitamin d3-50 mcg (2,000 unit)), colistimethate, diltiazem, dornase alfa, empty container, empty container, empty container, ferrous sulfate, fluticasone furoate-vilanterol, fluticasone propionate, trelegy ellipta, hydrocodone-acetaminophen, montelukast, multivitamin-minerals-lutein, lc plus, nebulizers, nebulizers, bd regular bevel needles, NON FORMULARY, omeprazole, oxygen-air delivery systems, polyethylene glycol, sodium chloride, sodium chloride, sterile water, syringe with needle, bd luer-lok syringe, spiriva respimat, tobramycin (pf), zinc gluconate, and zoledronic acid-mannitol&water.    Allergies: He is allergic to gentamicin, tobramycin, mirtazapine, and morphine.       EXAMINATION         Physical Examination:    Vital Signs :Vitals signs :His weight is 78 kg (172 lb). His respiration is 16.       Neurological examination:    Mental Status testing is normal. Language and speech are normal.    Cranial Nerve exam:    Extra ocular muscles are full, face is strong and symmetric. Gag is intact. Tongue is strong. Neck flexion and extension are strong.    Motor examination: Muscle bulk and muscle tone are normal.    Strength evaluation:      Strength evaluation Upper Extremities (MRC/lbs.):        SA EF  EE  WE  WF  FF FE ThAb FAb   Right 5 5 5 5 5 5 5 5 5    Left 5 5 5 5 5 5 5 5 5    R lbs            L lbs                 Strength evaluation Lower Extremities (MRC):        HF KE KF DF PF Toe E Toe F   Right 5 5 5 5 5      Left 5 5 5 5 5      R- lbs          L- lbs              Reflexes:    Reflexes Right Left   Biceps 2+ 2+   Triceps 2+ 2+   Brachioradialis 2+ 2+   Patella 2+ 2+   Achilles 0 0   Plantar Response Flexor Flexor     Sensory Examination:  Pinprick sensation is normal also reduced in the bottom of his feet up to metatarsophalangeal joint.  Vibration is absent at the great toe and present at the medial malleolus (right side worse than the left).  Proprioception is normal.    Coordination and Gait:  Gait is narrow-based and steady, with normal arm swing. Tandem gait is intact. Patient is able to rise on toes and heels and is able to get up from a low chair without difficulty.     Please note that portions of this note may be dictated using Dragon natural speaking voice recognition software.  Variances in spelling and vocabulary are possible and unintentional.

## 2023-03-04 ENCOUNTER — Ambulatory Visit: Admit: 2023-03-04 | Discharge: 2023-03-05 | Payer: PRIVATE HEALTH INSURANCE

## 2023-03-04 DIAGNOSIS — M818 Other osteoporosis without current pathological fracture: Secondary | ICD-10-CM | POA: Diagnosis not present

## 2023-03-04 MED ADMIN — zoledronic acid-mannitol&water (RECLAST) 5 mg/100 mL infusion 5 mg: 5 mg | INTRAVENOUS | @ 17:00:00 | Stop: 2023-03-04

## 2023-03-04 NOTE — Unmapped (Signed)
Pt presents to infusion center today for Reclast 5 mg dose infusion.   Lab results from November of this year:   Vit D 55.9  Ca 9.1  Crea 1.05  eGFR 77    #22 PIV placed to LAC, positive blood return confirmed.   @1138 : Reclast infusion initiated.   @1153 : infusion completed without complications.   Pt remains VSS, afebrile.   Line care provided with positive blood return; PIV flushed, discontinued.   Pt discharged from clinic in NAD, in stable condition.

## 2023-03-11 NOTE — Unmapped (Signed)
[facility] Specialty and Home Delivery Pharmacy Refill Coordination Note    Jordan [name], DOB: 31-May-1955  Phone: [phone] (home)       All above HIPAA information was verified with patient.         03/10/2023     1:42 PM   Specialty Rx Medication Refill Questionnaire   Which Medications would you like refilled and shipped? Colistimethate with necesary mixing accessories. , 10% SODIUM CHLORIDE.   Please list all current allergies: Tobramycin IV solution; Gentamicin IV solution; Morphine; Furosemide.   Have you missed any doses in the last 30 days? No   Have you had any changes to your medication(s) since your last refill? No   How many days remaining of each medication do you have at home? Zero Colistimithate as i take it every other month. 10 Days of 10% Sodium Chloride   Have you experienced any side effects in the last 30 days? No   Please enter the full address (street address, city, state, zip code) where you would like your medication(s) to be delivered to. [address] , [place] [address]   Please specify on which day you would like your medication(s) to arrive. Note: if you need your medication(s) within 3 days, please call the pharmacy to schedule your order at [phone]  03/19/2023   Has your insurance changed since your last refill? No   Would you like a pharmacist to call you to discuss your medication(s)? No   Do you require a signature for your package? (Note: if we are billing Medicare Part B or your order contains a controlled substance, we will require a signature) No         Completed refill call assessment today to schedule patient's medication shipment from the [facility] Specialty and Home Delivery Pharmacy [phone]).  All relevant notes have been reviewed.       Confirmed patient received a Conservation officer, historic buildings and a Surveyor, mining with first shipment. The patient will receive a drug information handout for each medication shipped and additional FDA Medication Guides as required. REFERRAL TO PHARMACIST     Referral to the pharmacist: Not needed      [facility]     Shipping address confirmed in Epic.     Delivery Scheduled: Yes, Expected medication delivery date: 03/19/2023.     Medication will be delivered via UPS to the prescription address in Epic WAM.    [facility] [name] Specialty and Home Delivery Pharmacy Specialty Technician

## 2023-03-16 DIAGNOSIS — J471 Bronchiectasis with (acute) exacerbation: Secondary | ICD-10-CM | POA: Diagnosis not present

## 2023-03-16 NOTE — Unmapped (Signed)
Faxed CMN for oxygen and efaxed provider note as requested.

## 2023-03-18 DIAGNOSIS — J449 Chronic obstructive pulmonary disease, unspecified: Secondary | ICD-10-CM | POA: Diagnosis not present

## 2023-03-18 DIAGNOSIS — J479 Bronchiectasis, uncomplicated: Secondary | ICD-10-CM | POA: Diagnosis not present

## 2023-03-18 DIAGNOSIS — G4733 Obstructive sleep apnea (adult) (pediatric): Secondary | ICD-10-CM | POA: Diagnosis not present

## 2023-03-18 MED FILL — TOBRAMYCIN 300 MG/5 ML IN 0.225 % SODIUM CHLORIDE FOR NEBULIZATION: RESPIRATORY_TRACT | 28 days supply | Qty: 280 | Fill #2

## 2023-03-18 MED FILL — LC PLUS MISC: 28 days supply | Qty: 1 | Fill #1

## 2023-03-18 NOTE — Unmapped (Signed)
Jordan [name] 's colistimethate shipment will be delayed as a result of no credit card on file to collect copayment.     I have reached out to the patient  at [phone]  and left a voicemail message.  We will wait for a call back from the patient to reschedule the delivery.  We have not confirmed the new delivery date.

## 2023-03-22 NOTE — Unmapped (Addendum)
Jordan [name] called in to the pharmacy to reschedule his delivery. He would like his BD syringes, Colymycin, and NS 10% nebulizer solution scheduled for delivery on 03/26/2023. Payment information was not available at the time and the patient is aware he will need to call back with said information in order to get the medications shipped out.

## 2023-03-25 MED FILL — BD POSIFLUSH NORMAL SALINE 0.9 % INJECTION SYRINGE: 30 days supply | Qty: 180 | Fill #5

## 2023-03-25 MED FILL — BD LUER-LOK SYRINGE 3 ML 21 GAUGE X 1": 30 days supply | Qty: 60 | Fill #3

## 2023-03-25 MED FILL — COLISTIN (COLISTIMETHATE SODIUM) 150 MG SOLUTION FOR INJECTION: 30 days supply | Qty: 60 | Fill #4

## 2023-03-25 NOTE — Unmapped (Signed)
[facility] Specialty and Home Delivery Pharmacy Refill Coordination Note    Specialty Medication(s) to be Shipped:   colistimethate 150 mg injection (COLYMYCIN)    Other medication(s) to be shipped:  syringes,saline .9% injection,sodium chloride 10%     Jordan [name], DOB: 14-Jan-1956  Phone: [phone] (home)       All above HIPAA information was verified with patient.     Was a Nurse, learning disability used for this call? No    Completed refill call assessment today to schedule patient's medication shipment from the [facility]  [phone]).  All relevant notes have been reviewed.     Specialty medication(s) and dose(s) confirmed: Regimen is correct and unchanged.   Changes to medications: Jordan [name] reports no changes at this time.  Changes to insurance: No  New side effects reported not previously addressed with a pharmacist or physician: None reported  Questions for the pharmacist: No    Confirmed patient received a Conservation officer, historic buildings and a Surveyor, mining with first shipment. The patient will receive a drug information handout for each medication shipped and additional FDA Medication Guides as required.       DISEASE/MEDICATION-SPECIFIC INFORMATION        N/A    SPECIALTY MEDICATION ADHERENCE     Medication Adherence    Patient reported X missed doses in the last month: 0  Specialty Medication: colistimethate 150 mg injection (COLYMYCIN)  Patient is on additional specialty medications: No  Patient is on more than two specialty medications: No  Any gaps in refill history greater than 2 weeks in the last 3 months: no  Demonstrates understanding of importance of adherence: yes  Informant: patient  Reliability of informant: reliable  Provider-estimated medication adherence level: good  Patient is at risk for Non-Adherence: No  Reasons for non-adherence: no problems identified  Support network for adherence: family member  Confirmed plan for next specialty medication refill: delivery by pharmacy  Refills needed for supportive medications: not needed          Refill Coordination    Has the Patients' Contact Information Changed: No  Is the Shipping Address Different: No         Were doses missed due to medication being on hold? No    colistimethate 150 mg injection (COLYMYCIN)  : 0 days of medicine on hand       REFERRAL TO PHARMACIST     Referral to the pharmacist: Not needed      [facility]     Shipping address confirmed in Epic.       Delivery Scheduled: Yes, Expected medication delivery date: 01/03.     Medication will be delivered via UPS to the prescription address in Epic WAM.    [facility] Delivery Pharmacy  Specialty Technician

## 2023-03-29 DIAGNOSIS — J479 Bronchiectasis, uncomplicated: Principal | ICD-10-CM

## 2023-03-29 DIAGNOSIS — J4550 Severe persistent asthma, uncomplicated: Principal | ICD-10-CM

## 2023-03-29 MED ORDER — BD REGULAR BEVEL NEEDLES 21 GAUGE X 1 1/2"
3 refills | 0.00 days | Status: CP
Start: 2023-03-29 — End: ?
  Filled 2023-03-30: qty 60, 30d supply, fill #0

## 2023-03-29 MED ORDER — TRELEGY ELLIPTA 100 MCG-62.5 MCG-25 MCG POWDER FOR INHALATION
Freq: Every day | RESPIRATORY_TRACT | 3 refills | 90 days | Status: CP
Start: 2023-03-29 — End: 2024-03-28

## 2023-03-30 NOTE — Unmapped (Signed)
Faxed oxygen CMN to [employer]

## 2023-04-01 NOTE — Unmapped (Signed)
Returned call to patient regarding oxygen. Left message letting him know I would send the requested clinic note to [employer] for his oxygen renewal.    Sent clinic note to [employer] for oxygen renewal.

## 2023-04-06 DIAGNOSIS — J479 Bronchiectasis, uncomplicated: Principal | ICD-10-CM

## 2023-04-06 MED ORDER — DORNASE ALFA 1 MG/ML SOLUTION FOR INHALATION
Freq: Every day | RESPIRATORY_TRACT | 11 refills | 30.00 days | Status: CP
Start: 2023-04-06 — End: 2024-04-05
  Filled 2023-05-17: qty 75, 30d supply, fill #0

## 2023-04-09 DIAGNOSIS — J479 Bronchiectasis, uncomplicated: Principal | ICD-10-CM

## 2023-04-16 NOTE — Unmapped (Signed)
..  Jordan [name] has declined refill due to  Patient was sent his fill of Tobramycin last month and is good until March. Would like a call back for medications due for fill around 2/15.  Jordan [name] Refill assessment call date has been updated per the patient's request.

## 2023-04-20 ENCOUNTER — Inpatient Hospital Stay: Admit: 2023-04-20 | Discharge: 2023-04-20 | Payer: PRIVATE HEALTH INSURANCE

## 2023-04-20 ENCOUNTER — Ambulatory Visit
Admit: 2023-04-20 | Discharge: 2023-04-20 | Payer: PRIVATE HEALTH INSURANCE | Attending: Internal Medicine | Primary: Internal Medicine

## 2023-04-20 DIAGNOSIS — J449 Chronic obstructive pulmonary disease, unspecified: Secondary | ICD-10-CM | POA: Diagnosis not present

## 2023-04-20 DIAGNOSIS — J4550 Severe persistent asthma, uncomplicated: Secondary | ICD-10-CM | POA: Diagnosis not present

## 2023-04-20 DIAGNOSIS — J9611 Chronic respiratory failure with hypoxia: Secondary | ICD-10-CM | POA: Diagnosis not present

## 2023-04-20 DIAGNOSIS — J479 Bronchiectasis, uncomplicated: Secondary | ICD-10-CM | POA: Diagnosis not present

## 2023-04-20 DIAGNOSIS — A498 Other bacterial infections of unspecified site: Principal | ICD-10-CM

## 2023-04-20 MED ORDER — NEBULIZERS
Freq: Every day | 3 refills | 0.00 days | Status: CP
Start: 2023-04-20 — End: ?
  Filled 2023-06-24: qty 280, 28d supply, fill #3

## 2023-04-20 MED ORDER — SODIUM CHLORIDE 10 % FOR NEBULIZATION
Freq: Two times a day (BID) | RESPIRATORY_TRACT | 3 refills | 90.00 days | Status: CP
Start: 2023-04-20 — End: ?
  Filled 2023-05-17: qty 180, 30d supply, fill #6
  Filled 2023-06-24: qty 900, 30d supply, fill #0

## 2023-04-20 NOTE — Unmapped (Addendum)
Referral to [facility] pulmonary rehab.      Looking into Biwaze and VolaraDevice.    We can increase the Trelegy steroid dose if needed.    Please note that if your next visit is scheduled after September 21, 2023, we may need to contact you to adjust your visit date/time if the doctor's schedule changes. We will do this as far in advance as possible to avoid any inconvenience on your schedule.    Symptoms of a bronchiectasis exacerbation: 48 hours of at least two of the following:  Increased cough  Change in volume or appearance of sputum  Increased sputum purulence  Worsening shortness of breath and/or exercise tolerance  Fatigue and/or malaise  Coughing up blood (hemoptysis)    If you are experiencing some of these symptoms:  Increase the frequency and/or intensity of your airway clearance  Try to submit a sputum sample for bacterial and AFB cultures (at [facility] or locally)  Reach out to me or your local physician.  If you need antibiotics, recommend treating for 14 days.      Please bring any new airway clearance equipment to your next visit to ensure that you are using and caring for it properly.    Thank you for allowing me to be a part of your care. Please call the clinic with any questions.    Viona Gilmore, MD, MPH  Pulmonary and Critical Care Medicine  [address]  [id number]  [place], [place] [address]    Thank you for your visit to the [facility] Pulmonary Clinics. You may receive a survey from [facility] regarding your visit today, and we are eager to use this feedback to improve your experience. Thank you for taking the time to fill it out.    Between appointments, you can reach [place] at these numbers:  For appointments: [phone]  For my nurse, Alvino Blood: [phone]  Fax: [phone]  For urgent issues after hours: Hospital Operator @ [phone] & ask for Pulmonary Fellow on call    My [facility] Chart is for non-urgent messages. This means you have a simple medical question that does not require an immediate response.     If you need immediate attention, call 911.     Responses may take up to 3 business days. Your message will be read by your provider or another medical team member who may respond on your provider???s behalf.    Some questions cannot be answered through messages in My [facility] Chart. Depending on your question, your provider???s office may ask you to schedule an appointment.     Information sent through My [facility] Chart will become part of your medical record.    For further information, check out the websites below:    General Information:  [facility] Bronchiectasis/NTM Care and Research Center: [email or web address]  Information about bronchiectasis (BE): Speak Up In BE - Page for People With Bronchiectasis (speakupinbronchiectasis.com)   Information about Non-tuberculous Mycobacterial Infections:  https://www.ntminfo.org   Videos from the patient session of the World Bronchiectasis Conference in [place] on October 03, 2016 can be watched here (TrustyNews.es).   Copy for Individuals with Bronchiectasis and/or NTM through the Bronchiectasis Registry and the COPD Foundation: https://www.mckee-young.org/  [employer] NTM Patient Education Program: https://nyulangone.org/care-services/bronchiectasis-ntm-program/ntm-patient-education-program  First [place] American Bronchiectasis and NTM Patient Education Program on Mar 02, 2023: https://players.BlogSelections.co.uk    Airway Clearance, Exercise, and Care:  Impact Airway Clearance Education: http://www.impact-be.com  Bronchiectasis Toolbox (information about airway clearance): DiningCalendar.de  Exercise  program by [facility] [place]: http://www.brown.com/  Be Clear with Bronchiectasis by Toniann Ket, MPH (health educator and patient): https://www.letsbecleartoday.com/    Research Opportunities:  Interested in our bronchiectasis and NTM clinical trials: https://go.ToyArticles.ca      Interested in other clinical trials and research opportunities?  www.clinicaltrials.gov  The Rare Diseases Clinical Research Network [facility]) Genetic Disorders of Mucociliary Clearance Consortium [facility]) Contact Registry is a way for patients with disorders of mucociliary clearance (such as bronchiectasis) and their family members to learn about research studies they may be able to join. Participation is completely voluntary and you may choose to withdraw at any time. There is no cost to join the [employer]. For more information or to join the registry please go to the following website:  BakersfieldOpenHouse.hu

## 2023-04-20 NOTE — Unmapped (Addendum)
  Bronchiectasis/[facility]   Assessment:      Patient:Jordan [name] (Jan 13, 1956)    Jordan [name] is a 68 y.o. male seen for follow up of bronchiectasis with chronic Pseudomonas colonization and chronic hypoxemic respiratory failure requiring supplemental oxygen with exertion and sleep.  Spirometry stable and symptoms improved since last visit with two hospitalizations in the past year for exacerbations.    Bronchiectasis Severity Index Score: Calculated on 10/23/22.    Age 24-69 = 2   BMI 18.5 or higher = 0   FEV1 % predicted 30-49% = 2   Hospitalized for severe exacerbation in past 2 years Yes = 5   Exacerbations in previous year 0-2 = 0   mMRC dyspnea score 3 = I stop for breath after walking about 100 yards or after a few minutes on level ground.   Chronic Pseudomonas colonization (at least 2 positive cultures at least 3 months apart within 1 year) Yes = 3   Colonization with other potential pathogenic bacteria (at least 2 positive cultures at least 3 months apart within 1 year) * No = 0   Radiologic Severity 3 or more lobes or cystic brx = 1       Total Score 16     BSI score of 9+ = Severe bronchiectasis (1 year outcomes 7.6 - 10.5% mortality, 16.7 - 52.6% hospitalization rate; 4 year outcomes 9.9 - 29.2% mortality, 41.2 - 80.4% hospitalization)     * NTM is not included.    Jordan [name] Quant JD et al. The Bronchiectasis Severity Index: An International Derivation and Validation Study. AJRCCM, 2013; 189(5): [phone].)      Plan:      Problem List Items Addressed This Visit          Respiratory    Bronchiectasis (CMS-HCC) - Primary (Chronic)    Relevant Medications    sodium chloride 10 % Nebu    nebulizers (LC PLUS) Misc    Other Relevant Orders    AFB culture    Lower Respiratory Culture    Ambulatory referral to Pulm Rehab    AFB SMEAR (Completed)    Chronic respiratory failure with hypoxia (CMS-HCC)    Relevant Orders    Ambulatory referral to Pulm Rehab       Other    Pseudomonas aeruginosa infection     Other Visit Diagnoses         Severe persistent asthma, unspecified whether complicated        Relevant Orders    Ambulatory referral to Pulm Rehab      Chronic obstructive pulmonary disease, unspecified COPD type (CMS-HCC)        Relevant Orders    Ambulatory referral to Pulm Rehab        Therapies:  Patient did not need to meet with the respiratory therapist today.  No changes were made to inhaled medications nor airway clearance regimen.  We discussed possibly pursuing the BiWaze Clearance System vs VolaraSystem for outpatient.  Would like to optimize airway clearance in order to avoid future hospitalizations.  He will let me know if he finds that he is needing to use his Albuterol more frequently. Can then increase Trelegy to 200 mcg.  He will continue using his Life2000 since he continues to benefit from regular use.     Akram has bronchiectasis due to unknown cause as seen on chest CT done on 11/14/2021 and prior.  Bronchiectasis has resulted in a chronic daily productive cough that has  been present for over 6 months as well as 6 exacerbations requiring antibiotic treatment over the past 12 months; two of these required hospitalization.  For airway clearance, he has tried and failed: Acapella/Flutter/PEP, Breathing Techniques/Huff Cough, CPT (Manual or Percussor), Exercise, and Nebulized Hypertonic Saline.  These methods failed, are contraindicated, or inappropriate for the following reasons: Did Not Mobilize Secretions, Insufficient Expiratory Force, and No Skilled Caregiver.  Therefore, it is medically necessary for Jordan [name] to use the BiWaze Clearance System for airway clearance.     Testing:  Patient spontaneously expectorated sputum that was sent for bacterial and AFB cultures. These results will help to guide antibiotic therapy in the future during exacerbations.  If he has future exacerbations, sputum should be sent for bacterial and AFB cultures. The bacterial culture should be processed like a cystic fibrosis sample given the overlapping pathogens. There are standing orders for these cultures in our system.  No labs or imaging needed at this time.    Referrals:  Referred to [facility] program.  Would benefit from enrolling following two hospitalizations.    Vaccinations:  COVID-19: Up to date  Flu: Up to date  Pneumococcal: Up to date  Tdap/Td: Up to date  RSV: Up to date    Jordan [name] will return to clinic in 3 months for follow-up with pre-bronchodilator spirometry and sputum cultures.  He will call or send me a message via MyChart if questions or concerns arise before this visit. Jordan [name] is in agreement with the above plan.     Subjective:      HPI: Jordan [name] is a 68 y.o. male who is seen for follow up of bronchiectasis with PsA colonization and prior history of multiple lung resections.    09/27/18:  I last saw him on 05/17/18. He reached out at the end of May reporting symptoms c/w an exacerbation. I treated him with a two week course of Ciprofloxacin, which seemed to help.  Reports that he feels winded too much - if walks up incline or up a couple hundred feet. This has worsened over the past year. Has to stop and let lungs catch up.  Of note, he has been off testosterone supplementation for the past year after he was diagnosed with prostate cancer.    He received the [facility], which he is using with nebulized HTS 7% and Pulmozyme. After using the Vest for 6-8 weeks, he noticed improvement with more phlegm mobilization. Sometimes has to pause it to cough to get stuff up.  Plugging feeling happening a lot more, especially with the vest.  Most of the time, sputum is thinned out with Pulmozyme but still gets those plugs.    Had to stop eating ice cream or drinking milkshake - will cough will eating and that makes him vomit on occasion. Past few weeks, when eating will cough, no matter what he eats.  Happens almost once a day. Doesn't feel like things get stuck typically. 02/07/19:  Following his last visit, Jordan [name] reached out regarding portable oxygen.  We had considered the life 2000 but he does not qualify because he does not use supplemental oxygen.  He performed the 6-minute walk test prior to today's visit but did not desaturate.  He wonders if this is because the test was done in the morning and he doesn't have breathing issues in the morning.  Typically, breathing issues occur in afternoon.  During these episodes, he will have to use his inhaler and feels like he is suffocating.  He describes it as an uncomfortable feeling that limits his ability to do things with family.  If he is having trouble catching breath, oximetry is 92-93%. Has seen it in the 80s if comes in from outside and is short of breath.    For airway clearance, he is nebulizing 10% hypertonic saline and using his percussive vest for 20 minute sessions in AM and PM. Has to pause it to cough and get stuff up.  Coughing up more stuff since last visit and is waking up 1-2 times a night with cough.having coughing fits between clearance session.  What he expectorates is less of a mucous plug and mostly stringy sputum that is yellowish to light brown in color.  No hemoptysis.  On Saturday, he had a coughing fit that nearly triggered posttussive emesis.  He only expectorated about 3-4 globs of phlegm.  Nebulizing Pulmozyme daily.  Alternates [name] and colistin every other month.  On [name] this month and will start Colistin on Dec 1.    06/06/19:  Coughing more and more sputum.  Waking up 1-2 times per night coughing. Can cough once or just keep coughing.  Sputum is yellow in color without evidence of blood.  He is not expectorating green sputum currently. On colistin this month; alternates with [name]. Went through January off all inhaled antibiotics.  Oxygen levels in the low 90s.  Uses rescue inhaler if goes up stairs.  Regularly performing airway clearance with nebulized 10% hypertonic saline twice a day, percussive vest twice a day, and Pulmozyme daily. He last received IV antibiotics in January 2019 but has been treated with oral antibiotics on 04/19/18 and 08/17/18, 03/10/19.      11/10/19:  1-2 times per week can get sharp right sided chest pain - even with nipple line. Can happen with clearance or activity or rest. Never during sleep. Following his hospitalization, he first experienced it in June. Lasts only about 10 seconds. Not reproducible. Limited activity due to dyspnea. Regularly performing airway clearance with nebulized 10% hypertonic saline twice a day, percussive vest twice a day, and Pulmozyme daily. Remains on chronic azithromycin therapy.    02/13/20:  Showed up to [facility] with oxygen saturations of 87-88% after walking from parking lot (<100 yd) and up incline without supplemental oxygen.  Using 2L continuous flow with exertion and sleep.  Had sleep study done at [facility] on RA and was told that he should be fine with just the supplemental oxygen. I do not have this report. Using oxygen when goes to football game; needs long period to recover from activity.  Going up 20 foot incline with oxygen, will have to stop and catch breath.     Wheezing more lately and endorses chest tightness. Has need to use albuterol as rescue 3-4 times per day.  Used albuterol in the car on the way here. Typically would be able to cough up large amount of mucus after PFTs but only coughed up small amount. Feels like mucus is stuck in lungs despite twice daily airway clearance with 10% HTS and Vest. Also using Pulmozyme once a day and Symbicort twice a day after clearance.  Using colistin this month. He ordered the [name] nebs to use next month even though insurance won't cover it. Feels like inhaled antibiotics definitely help.    05/10/20:   Saw [facility] for lung transplant evaluation.  Felt to be poor candidate due to prior surgeries and primary disease.  Candidacy closed at [facility]. Referral  sent to [facility] and [facility] on 2/15.  Based on labs and testing done there, getting Hep B vaccination series (not Hep A or Hep B immune).  Also getting DEXA that was ordered by Dr. Abigail Miyamoto.    Had PSG done at [facility] - no evidence of OSA but does have increased upper airway resistance.  He never sleeps on his back; only sleeps on side or stomach.   Fatigue is most burdensome symptom.    Regrets that he let the surgeon operate on his lung before he met me.  He completed pulmonary rehab at [facility] but they only had him on machine for ~30 minutes and then stretching.  No strength exercises. He is planning on joining a gym based on recommendations from [facility] transplant.  Was surprised that he didn't desaturate during his walk test at [facility].    07/02/20:  At last visit, referred him to [facility] for pulmonary rehab. Found to have osteoporosis in hips. Turned down for transplant at [facility] based on chart review.  No change in breathing or cough.  Every once in a while, has bad coughing spells.  Same sputum volume and color. Globby to stringy. Not dark brown or bloody.  Sputum is light brown to yellowish.  Breathing about the same.  Started on 1200 mg calcium for osteoporosis.  Starting telehealth pulmonary rehab - feels optimistic that it will help him do more.    10/31/20:  Heart monitor for pulm rehab - had 40 events of fast rate or afib.  Has felt heart fluttering.  Did event monitor for 8 days.  Getting echocardiogram on Wednesday and then seeing cardiologist the next week. Was having sharp pains over right abdomen below ribs.  Once a month, now once every 2 weeks.    03/18/21:  Since his last clinic visit, he was evaluated by cardiology at [facility] (October) and then was admitted to [facility] earlier this month due to hemoptysis that occurred while on ciprofloxacin for an exacerbation.  He also had worsening shortness of breath.  Was discharged home on 21 days of meropenem.  Did not undergo embolization or bronchoscopy. Hasn't felt good since August.  No energy, lost weight. End Nov, asked for Cipro and for few days felt better. Then didn't feel better and was ordeal to see PCP.  WBC elevated in ER - they called me. Friday night/Saturday started seeing dark blood in sputum.  Monday night 8PM, got breathing treatment with bright red globs.  Large volume blood and phlegm. They were going to do bronchoscopy but then bleeding improved with change in abx, holding 10% saline. Was continued on percussive vest and Pulmozyme. Friday to Saturday, wine colored.  Then pinkish. Sunday night could barely see blood and then gone Monday.    Finished Meropenem last night (3 weeks total). Christmas eve morning, started seeing bright red streaks. Yesterday, coughed up bright red blood that was 50/50 with phlegm.  Today more wine colored.   Shortness of breath better with meropenem but no change in hemoptysis.    07/01/21:  After last visit, we attempted to perform bronchoscopy but due to hypoxemia, we canceled the procedure.  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  He also had a visit with Dr. Erven Colla in interventional radiology and is scheduled for bronchial artery embolization later this month.    Feels off balance but not dizzy/spinning.  This  has been intensifying with increasing the voriconazole dose. Happening daily every time he stands up. No hearing changes. Head feels heavy.  No vision changes nor double visions. Sinuses up and down, bleed a little bit.  Some post-nasal drip.     10/14/21:  At last visit, pursued Life2000 for his work of breathing. Started mirtazapine for appetite, mood, and sleep. Underwent BAE on 07/11/21. Tapering off voriconazole due to development of peripheral neuropathy and lower extremity swelling.  Had been on voriconazole for about 5 months.    Swelling started slightly in April and has progressed. Neuropathy started in tips of toes a few weeks ago but now at ankles.  More shortness of breath when laying flat.  Has woken up at night short of breath.  Work of breathing outside is better on L2K.    11/21/21:  At last visit, reporting ascending peripheral neuropathy and more rapidly progressive lower extremity edema. Planned to continue voriconazole 100 mg daily and repeat chest CT to see if any evolution of his mycetoma. No lab evidence that he is absorbing his inhaled antibiotics.    Since last visit, swelling seems to have increased.  Lasix seemed to help for 2 days but then no difference aside from no longer waking up night to urinate.  Neuropathy to the ankles bilaterally.  Feeling more swollen in legs and even abdomen feels     04/14/22:  At last visit, stopped voriconazole, ordered CT abd/pelvis, referred to endocrinology. Since then, he saw speech therapy for swallowing exercises.  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 Additional left-sided lumbosacral radiculopathy (L5-S1) of chronic nature is also noted.    Since that visit, reached out reporting symptoms of exacerbation. He dropped a sputum at [facility] for culture but there is nothing in their system showing a sample was received/process.  Prescribed Cipro but didn't feel like he got as much improvement from it as usual.  Feeling washed out. Neg COVID tests.  No fever.     08/04/22:  At last visit, wasn't feeling 100% after oral/inhaled antibiotics and we discussed potentially treating with IV antibiotics as outpatient. Having periods of time when coughing spells worsen and then improve again.  Did treat him with Augmentin in early February.  Has been having difficulty with L2K unit at home.      Waiting for L2K to work - 2 months. Got new regulator and then stopped working.  Spoke with someone there and since it works on Clinical research associate, told its the regulator.  [employer] is sending someone from [place] to prove that its the regulator. If oxygen briefly in home with activity, drops to mid-80s.    10/23/22:  At last visit, made no changes to his airway clearance regimen nor medications; placed referral for colonoscopy to be done at [facility]. Since then, he feels he has had more frequent spells of chest tightness, wheezing, and difficulty expectorating sputum. For example, he had a hard time with this around the time of evening nebulizer treatment a week ago and last night. He also noted desaturation with O2 off of his nose with 20-50 feet ambulation inside his house. It slowly went back to low 90s once settled in and putting nasal cannula back. He denies hemoptysis or fever, chills around this episode.     01/26/23:  At last visit, planned for scheduled admission for bronchiectasis tune up.  Completed 3 weeks of IV Zosyn, oral Cipro, and inhaled [name].  Was feeling  much better after treatment.  Worsening cough, sputum, and fatigue over past few weeks.  Has felt so poorly that he felt the need to take a COVID test (was negative).  Coughing was so bad last night that his wife stated if we were not coming to see Dr. Garner Nash tomorrow, I would tell you to go to [facility].  Significant rhinorrhea but no sore throat.  Body aches but no fever.     04/20/23:  At last visit, was feeling poorly iso rhino/enterovirus infection. Admitted to [facility] and treated with prendisone x 5 days, IV Zosyn, oral cipro, and inhaled colistin.  Oral Cipro at end of December got 14 days Cipro.      Respiratory Symptoms:   Cough: Has spells but nothing too bad. Not alarmed.   Nocturnal awakenings: 1-2 times per week but not real bad  Wheezing: 1-2x/month, not continuous.  Chest tightness: absent.  Rescue albuterol use: 1 time /week   Pleurisy:  Resolved  Hemoptysis: Nothing since embolization.  MMRC Stage: 4 = I am too breathless to leave the house or I am breathless when dressing.     Exacerbations:   Number of exacerbations treated in the past year: 6  Dates of exacerbations: 03/2018, 07/2018, 02/2019, 09/2019, 12/2019, 10/2020, 01/2021, 07/2021, 03/2022, 04/2022, 07/2022, 10/2022, 01/2023, 02/2023  Number of hospitalizations for exacerbations in the past 2 years: 2  Dates of hospitalizations: 01/2015, 03/2015, 01/2016, 03/2017, 09/2019, 10/2020, 02/2021, 10/2022, 01/2023 (rhino/enterovirus)    Pulmonary Therapies:   Airway clearance: Pulmozyme daily, HTS 10% bid, percussive vest bid.   Inhaled antibiotics: Alternates [name] and Colistin monthly - on Colistin in odd number of months and [name] in even number of months.   Inhalers: Trelegy 100  Chronic antibiotics: azithromycin 250 mg daily.   Exercise: Hasn't walked on treadmill in long time.  Pulmonary Rehab: Telehealth pulmonary rehab July 2022  Supplemental oxygen: 2L continuous at rest and with sleep. Increased to 4-5L with activity. Has not changed  NIPPV: No    Past Medical History:   Diagnosis Date    Abscess of lung (CMS-HCC) 11/06/2010    CT Chest 10/22/10 12/05/2010 right thoracotomy resection of right middle lobe and resection of right lower lobe abscess     Arthritis     back    Biceps tendon tear 2013    left side    Bronchiectasis (CMS-HCC)     chronic psuedomonas infection    Degenerative joint disease of left acromioclavicular joint     GERD (gastroesophageal reflux disease)     Hypertension     Obstructive sleep apnea on CPAP 03/14/13    AHI 33.5, on BiPAP 12/8 based on sleep study 09/2013    Pneumonia 2012    Lung abcess     PONV (postoperative nausea and vomiting)     Prostate cancer (CMS-HCC) 09/09/2017    Rotator cuff injury left    Vertigo        Past Surgical History:   Procedure Laterality Date    APPENDECTOMY  1969    BACK SURGERY      BRONCHOSCOPY  10/19/2010    [facility]    CARPAL TUNNEL RELEASE  1999    IR EMBOLIZATION HEMORRHAGE ART OR VEN  LYMPHATIC EXTRAVASATION  07/11/2021    IR EMBOLIZATION HEMORRHAGE ART OR VEN  LYMPHATIC EXTRAVASATION 07/11/2021 Braulio Conte, MD [facility]    LUNG REMOVAL, PARTIAL Right 10/2010    RML and RLL partially resected  MENISCECTOMY  2011    NECK SURGERY  1993, 1999, 2002    PR COLSC FLX W/RMVL OF TUMOR POLYP LESION SNARE TQ Left 02/08/2023    Procedure: COLONOSCOPY FLEX; W/REMOV TUMOR/LES BY SNARE;  Surgeon: Jules Husbands, MD;  Location: [facility];  Service: Gastroenterology    PR GERD TST W/ MUCOS IMPEDE ELECTROD,>1HR N/A 07/06/2012    Procedure: ESOPHAGEAL FUNCTION TEST, GASTROESOPHAGEAL REFLUX TEST W/ NASAL CATHETER INTRALUMINAL IMPEDANCE ELECTRODE(S) PLACEMENT, RECORDING, ANALYSIS AND INTERPRETATION; PROLONGED;  Surgeon: None None;  Location: [facility] [place];  Service: Gastroenterology    PR OPEN TREAT RIB FRACTURE W/INT FIXATION, UNILATERAL, 1-2 RIBS Right 03/19/2015    Procedure: OPEN TREATMENT OF RIB FRACTURE REQUIRING INTERNAL FIXATION, UNILATERAL; 1-2 RIBS;  Surgeon: Evert Kohl, MD;  Location: MAIN OR [place];  Service: Cardiothoracic    PR THORACOTOMY W/THERAP WEDGE RESEXN ADDL IPSILATRL Right 08/07/2013    Procedure: THORACOTOMY; WITH THERAPEUTIC LOBECTOMY OF RIGHT MIDDLE AND RIGHT LOWER LOBE RESECTION , EACH ADDITIONAL RESECTION, IPSILATERAL;  Surgeon: Alvester Chou, MD;  Location: MAIN OR [place];  Service: Cardiothoracic    TONSILLECTOMY  1965       Family History   Problem Relation Age of Onset    Alzheimer's disease Mother     Stroke Mother     Bronchiectasis  Brother         presumed    Bronchiectasis  Brother         presumed    Liver disease Father     Diabetes Father     Heart failure Father     Bronchiectasis  Daughter     Asthma Son         childhood asthma    Clotting disorder Neg Hx     Anesthesia problems Neg Hx        Social History     Tobacco Use    Smoking status: Never    Smokeless tobacco: Never   Vaping Use    Vaping status: Never Used   Substance Use Topics    Alcohol use: No     Alcohol/week: 0.0 standard drinks of alcohol    Drug use: No       Allergies  Reviewed on 04/20/2023 Reactions Comments    Gentamicin Other (See Comments) BALANCE ISSUES    Tobramycin Other (See Comments) ototoxicity    Mirtazapine Other (See Comments) Lower extremity swelling    Morphine Nausea And Vomiting           Current Outpatient Medications   Medication Sig Dispense Refill    albuterol 2.5 mg /3 mL (0.083 %) nebulizer solution Inhale 3 mL by nebulization Three (3) times a day. 810 mL 3    albuterol HFA 90 mcg/actuation inhaler Inhale 2 puffs four (4) times a day as needed.      alcohol swabs (ALCOHOL PREP PADS) PadM Use as directed with inhaled antibiotics 100 each 5    ascorbic acid, vitamin C, (VITAMIN C) 1000 MG tablet Take 1 tablet (1,000 mg total) by mouth daily.      atorvastatin (LIPITOR) 10 MG tablet Take 1 tablet (10 mg total) by mouth daily.      azithromycin (ZITHROMAX) 250 MG tablet TAKE 1 TABLET BY MOUTH EVERY DAY 90 tablet 3    cholecalciferol, vitamin D3-50 mcg, 2,000 unit,, 50 mcg (2,000 unit) tablet Take 1 tablet (50 mcg total) by mouth daily.      colistimethate (COLYMYCIN) 150 mg injection Inject 2  mL sterile water for injection to mix colistin vial, then draw up 2 mL (150mg ) and inhale 2 times a day, 28 days on and 28 days off. 60 each 5    dilTIAZem (CARDIZEM CD) 120 MG 24 hr capsule Take 1 capsule (120 mg total) by mouth daily. 90 capsule 3    dornase alfa (PULMOZYME) 1 mg/mL nebulizer solution Inhale 1 ampule (2.5 mg) daily. Use at least 30-60 minutes before airway clearance, or after airway clearance 75 mL 11    empty container (SHARPS CONTAINER) Misc USE AS DIRECTED 1 each 0    empty container Misc USE AS DIRECTED 1 each 2    empty container Misc Use as directed to dispose of needles. When full, make sure lid is closed tightly then dispose of container in trash. 1 each 2    ferrous sulfate 325 (65 FE) MG tablet Take 1 tablet (325 mg total) by mouth Two (2) times a day. BID      fluticasone propionate (FLONASE) 50 mcg/actuation nasal spray 1 spray into each nostril daily. fluticasone-umeclidin-vilanter (TRELEGY ELLIPTA) 100-62.5-25 mcg inhaler Inhale 1 puff daily. 90 each 3    HYDROcodone-acetaminophen (NORCO) 7.5-325 mg per tablet Take 1 tablet by mouth three (3) times a day (at 6am, noon and 6pm).      montelukast (SINGULAIR) 10 mg tablet TAKE 1 TABLET BY MOUTH EVERY DAY AT NIGHT 90 tablet 3    multivitamin-minerals-lutein Tab Take 1 tablet by mouth daily.      nebulizers (LC PLUS) Misc Use with inhaled medications 1 each 5    nebulizers (LC PLUS) Misc Use as directed 4 each 3    nebulizers Misc use with nebulized medications 12 each 0    needle, disp, 21 G (BD REGULAR BEVEL NEEDLES) 21 gauge x 1 1/2 Ndle Use as directed with inhaled Colistin 100 each 3    NON FORMULARY Take 650 mg by mouth two (2) times a day. Brand: Citracal 650mg       omeprazole (PRILOSEC) 20 MG capsule Take 1 capsule (20 mg total) by mouth Two (2) times a day.      OXYGEN-AIR DELIVERY SYSTEMS MISC Inhale 2 mL.      sodium chloride (BD POSIFLUSH NORMAL SALINE 0.9) 0.9 % injection Inject 2mL of 0.9%NaCl into colistin vial & gently mix. After withdrawing colistin dose, add an additional 1mL of 0.9%NaCl to neb cup with the colistin dose. 180 mL 11    sodium chloride 10 % Nebu Measure and Inhale 5 mL by nebulization Two (2) times a day. Discard remaining amount and use a new vial for each dose. 900 mL 3    sterile water Soln Use 2mL to mix Colistin, then add additional 2mL to neb cup with 2mL of mixed colistin. Inhale twice daily 28 days on and 28 days off. 600 mL 5    syringe with needle (BD LUER-LOK SYRINGE) 3 mL 20 gauge x 1 1/2 Syrg For use with inhaled antibiotic (Colistin) 60 each 30    syringe with needle (BD LUER-LOK SYRINGE) 3 mL 21 gauge x 1 Syrg For use with inhaled antibiotic (Colistin) 60 each 5    tobramycin, PF, ([name]) 300 mg/5 mL nebulizer solution Inhale the contents of 1 ampule (300 mg total) by nebulization every twelve (12) hours. 28 days on and 28 days off. 280 mL 5    zinc gluconate 50 mg (7 mg elemental zinc) tablet Take 1 tablet (50 mg total) by mouth.  zoledronic acid-mannitol&water (RECLAST) 5 mg/100 mL PgBk Infusion      budesonide-formoterol (SYMBICORT) 80-4.5 mcg/actuation inhaler Inhale 2 puffs two (2) times a day. (Patient not taking: Reported on 04/20/2023)       No current facility-administered medications for this visit.     Physical Exam:   BP 120/65 (BP Site: L Arm, BP Position: Sitting, BP Cuff Size: Medium)  - Pulse 76  - Wt 79.3 kg (174 lb 14.4 oz)  - SpO2 95% Comment: 2L - BMI 27.39 kg/m??   Significantly fatigued but non-toxic appearing white male. Easy work of breathing without accessory muscle use. Bronchial breath sounds over upper lobes with decreased air movement over the right upper lobe as compared to left.  Crackles over mid to lower lung fields, decreased from prior.  No expiratory wheezes. Speaking in full sentences.  Regular rate and rhythm with normal S1 and S2.  No murmurs, rubs, or gallops.  Clubbing unchanged from prior.    Diagnostic Review:   The following data were reviewed during this visit with key findings summarized below:  Pulmonary Function Testing:  Spirometry consistent with moderate airway obstruction and suggestive of moderate restriction.  Measures are stable from prior. Normal inspiratory loop.    FVC (% predicted) FEV1 (% predicted) FEV1/FVC   11/10/2013 3.19 L (68%) 2.16 L (61%)  68%   02/23/2014 3.50 L (76%)  2.23 L (64%)  64%   07/12/2014  2.94 L (66%)  2.02 L (58%)  69%   01/15/2015  2.82 L (62%)  1.79 L (52%)  63%   02/08/2015  3.04 L (66%)  2.06 L (59%)  68%   04/02/2015  2.86 L (63%)  1.83 L (53%)  64%   12/10/2015  2.77 L (62%)  1.62 L (48%)  58%   01/27/2016  2.42 L (54%)  1.55 L (46%)  64%   03/24/2016  3.36 L (75%)  1.99 L (59%)  59%   07/21/2016  2.56 L (57%)  1.54 L (46%)  60%   12/29/2016  2.80 L (63%)  1.57 L (47%)  56%   04/06/2017  2.61 L (59%)  1.46 L (44%)  56%   07/06/2017  2.56 L (58%)  1.49 L (45%)  58%   10/12/2017  2.63 L (59%)  1.47 L (44%) 56%   01/18/2018  2.68 L (63%)  1.36 L (42%)  51%   05/17/2018 2.49 L (57%)  1.25 L (38%)  50%   09/26/2018 2.43 L (57%)  1.26 L (39%)  52%   01/26/2019 2.16 L (51%)  1.16 L (36%)  54%   06/06/2019 2.47 L (59%) 1.11 L (34%) 45%   11/10/19 2.14 L (50%) 1.20 L (37%) 56%   02/13/20 2.01 L (48%) 1.08 L (34%) 54%   05/02/20 2.29 L (54%) 1.11 L (34%) 48%   07/02/20 (pre) 2.11 L (51%) 1.17 L (37%) 56%   07/02/20 (post) 2.26 L (54%) [+7.2%] 1.22 L (38%) [+4.0%] 54%   10/31/20 2.46 L (59%) 1.17 L (37%) 48%   07/01/21 2.33 L (56%) 1.13 L (36%) 48%   09/29/21 1.92 L (47%) 1.01 L (32%) 53%   11/21/21 2.10 L (51%) 1.03 L (33%) 49%   04/14/22 2.09 L (54%) 1.05 L (35%) 50%   08/04/22 1.98 L (51%) 1.00 L (34%) 50%   10/23/22 2.07 L (53%) 0.95 L (32%) 46%   11/17/22 2.04 L (53%) 1.04 L (35%) 51%   01/26/23 1.97 L (54%) 0.97 L (35%)  50%   04/20/23 1.97 L (54%) 1.03 L (37%) 52%     6-minute Walk (12/06/19): Heart Rate: Resting Heart Rate was 71 bpm. Maximum Heart Rate achieved 97 bpm. Patient did reach target heart rate (85-132 bpm).  Heart rate returned to 77 bpm after 2 minutes of recovery.  Oxygen Saturation: Resting O2 Saturation was 94% on RA. O2 saturation nadir was 87% at minute 2. O2 saturation improved to 96% with 2L via [place] at minute 3 and maintained saturations above 88% for remainder of walk. O2 saturation returned to 96% on room air after 2 minutes of recovery.  Distance: Total 6 minute walk distance was 320 m. Predicted 6 minute walk distance 548.9 m (LLN of 395.8 m). Percent of predicted 6 minute walk distance of 58%. Testing included one 5-minute pause. Distance obtained after placed on 2L and walked for an additional 6 minutes.  BORG: Resting BORG score was 1. Maximum BORG score was 5 at minute 2. Concluding BORG score was 0 after 2 minutes of recovery. Impression: Significant exercise induced oxygen desaturation (below 88%). Exertional hypoxemia improved with 2L supplemental oxygen. Walk distance is less than expected suggesting a cardio-respiratory defect -- clinical correlation suggested.  Recommendation: Supplemental oxygen required with exercise at 2L [place]. Consider further evaluation with spirometry, lung volumes, respiratory muscle flow/force measurements, cardiopulmonary stress testing, and/or cardio/vascular studies.    6-Minute Walk (02/15/19): Resting Heart Rate was 91. Maximum Heart Rate achieved 119. Patient did reach target heart rate (86-133). Resting O2 Saturation was 98% on RA. Resting BORG score was 1. O2 saturation nadir was 91% at minute 6. Total 6 minute walk distance was 381 meters. Predicted 6 minute walk distance of 558 m (LLN of 414m). Percent of predicted 6 minute walk distance of 68%. Heart rate returned to 96 bpm after 2 minutes of recovery. O2 saturation returned to 96% on RA after 2 minutes of recovery. Maximum BORG score was 7 at minute 6. Concluding BORG score was 2 after 2 minutes of recovery. No evidence of resting or exercise induced hypoxemia. No supplemental oxygen is required at this time.    6-Minute Walk (01/26/19): Resting Heart Rate was 86. Maximum Heart Rate achieved 97. Patient did reach target heart rate (86-133). Resting O2 Saturation was 99% on RA. Resting BORG score was 1. O2 saturation nadir was 95% at minutes 3 and 6. Total 6 minute walk distance was 350 meters. Predicted 6 minute walk distance of 558 m (LLN of 463m). Percent of predicted 6 minute walk distance of 63%. Heart rate returned to 86 bpm after 2 minutes of recovery. O2 saturation returned to 96% on RA after 2 /minutes of recovery. Maximum BORG score was 6 at minutes 5 and 6. Concluding BORG score was 1 after 2 minutes of recovery. No evidence of resting or exercise induced hypoxemia. No supplemental oxygen is required at this time.    Cultures:    Source Bacterial Culture AFB Smear AFB Culture   04/02/15 Sputum 2+ OPF; 1+ Probably mPsA Negative Negative   07/02/15 Sputum 4+ OPF; 3+ mPsA; 3+ sPsA; 1+ Steno Negative Negative   01/23/16 Sputum 2+ OPF; 1+ mPsA; 1+ sPsA - -   03/24/16 Sputum 3+ OPF; 3+ mPsA; 3+ sPsA Negative Overgrown   07/21/16 Sputum 4+ OPF; 4+ mPsA; 2+ sPsA Negative Negative   12/29/16 Sputum 4+ OPF; 3+ mPsA; 3+ sPsA Negative Actinomadura sp   04/06/17 Sputum 3+ OPF; 4+ mPsA Negative Negative   07/06/17 Sputum 4+ OPF; 3+ mPsA Negative  Negative   10/12/17 Sputum 4+ OPF; 3+ mPsA Negative Negative   01/18/18 Sputum OPF Negative Negative   05/17/18 Sputum 4+ OPF; 3+ mPsA - -   09/26/18 Sputum 4+ OPF; 3+ mPsA Negative Negative   01/26/19 Sputum 4+ OPF; 2+ mPsA Negative Negative   02/15/19 Sputum 3+ OPF; 3+ mPsA Negative Negative   06/06/19 Sputum 3+ OPF; 3+ sPsA; 3+ mPsA Negative Negative   11/10/19 Sputum 2+ OPF; 3+ mPsA Negative Negative   02/13/20 Sputum OPF Negative Negative   07/02/20 Sputum 3+ OPF; 1+sPsA; 3+ mPsA negative negative   10/31/20 Sputum 4+ OPF; 2+ sPsA; 4+ mPsA negative negative   11/17/20 Sputum 3+ OPF; 3+ mPsA negative negative   02/21/21 Sputum Few PsA negative negative   02/27/21 Sputum Scant C albicans and light A fumigatus - -   03/18/21 Sputum 2+ OPF; 1+ mPsA negative negative   03/26/21 Sputum 4+ OPF; 3+ mPsA negative negative   09/29/21 Sputum 4+ OPF; 4+ sPsA; 4+ mPsA negative negative   04/14/22 Sputum 4+ OPF; 1+ mPsA negative negative   08/04/22 Sputum OPF - -   10/23/22 Sputum 4+ OPF; 2+ mPsA negative negative   11/07/22 Sputum 2+ OPF; 1+ mPsA; 1+ sPsA negative negative   01/26/23 Sputum 1+ OPF; 1+ mPsA negative negative   01/27/23 Sputum 3+ OPF; 2+ mPsA - -      Aspergillus Ag BAL/serum (04/02/21): 0.55 (@ [facility])    Bronchiectasis Evaluation   IgG with subclasses:   Lab Results   Component Value Date/Time    IGGT 996 04/28/2012 06:21 AM    IGG1 402 04/28/2012 06:21 AM    IGG2 399 04/28/2012 06:21 AM    IGG3 148.0 (H) 04/28/2012 06:21 AM    IGG4 47.7 04/28/2012 06:21 AM     IgA (09/21/11): 283   IgM (09/21/11): 52   IgE: (02/13/20): 16.6  Lab Results   Component Value Date/Time    IGE 16.6 02/13/2020 10:24 AM     Specific Titers: No results found for: TETANUSAB, TETGV, DIPHTERIAAB, DIPGV, S. pneumonia titers  HIV (05/02/20): Non-reactive  Autoimmune (05/02/20): ANA negative   ANCA: No results found for: ANCA, IFA, PR3ELISA, PR3QT, MPO, MPOQT  CBC-D (05/02/20): 6.1>14.8/44.3<156; ANC 4.1; ALC 1.2; AEC 0.17  Lab Results   Component Value Date/Time    WBC 8.9 01/27/2023 06:18 AM    WBC 6.5 01/26/2023 10:29 AM    WBC 6.7 11/12/2022 06:13 AM    WBC 7.9 08/13/2013 03:57 AM    WBC 9.4 08/12/2013 04:21 AM    WBC 11.7 (H) 08/11/2013 04:04 AM    HGB 13.1 01/27/2023 06:18 AM    HGB 14.8 03/24/2015 05:31 PM    HCT 38.8 (L) 01/27/2023 06:18 AM    HCT 30.8 (L) 08/13/2013 03:57 AM    PLT 172 01/27/2023 06:18 AM    PLT 292 08/13/2013 03:57 AM    NEUTROABS 7.2 01/27/2023 06:18 AM    NEUTROABS 3.9 01/26/2023 10:29 AM    NEUTROABS 4.1 11/07/2022 01:04 PM    NEUTROABS 4.9 06/21/2013 09:31 PM    NEUTROABS 4.6 06/21/2013 08:13 PM    NEUTROABS 4.1 04/26/2012 11:54 PM    LYMPHSABS 1.1 01/27/2023 06:18 AM    LYMPHSABS 1.5 01/26/2023 10:29 AM    LYMPHSABS 1.7 11/07/2022 01:04 PM    LYMPHSABS 1.9 06/21/2013 09:31 PM    LYMPHSABS 1.8 06/21/2013 08:13 PM    LYMPHSABS 2.8 04/26/2012 11:54 PM    EOSABS 0.0 01/27/2023 06:18 AM  EOSABS 0.4 01/26/2023 10:29 AM    EOSABS 0.5 11/07/2022 01:04 PM    EOSABS 0.2 06/21/2013 09:31 PM    EOSABS 0.2 06/21/2013 08:13 PM    EOSABS 0.3 04/26/2012 11:54 PM    BASOSABS 0.0 01/27/2023 06:18 AM    BASOSABS 0.0 06/21/2013 09:31 PM     Alpha-1 level: No results found for: A1TRYP  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      IgE (05/23/18):         Imaging  Chest CT (06/15/22): Images personally reviewed.  Minimal change from prior CT though there is a slightly smaller focus of soft tissue/consolidation, possibly reflective of mycetoma, within complex cavitary lesion in the apex of the right lung.  New small focus of endobronchial debris within the cystic airspaces of the lingula.    Chest CT (11/14/21): Images personally reviewed. I agree with radiology interpretation that he continues to have extensive cavitary consolidation in right apex with areas of dependent soft tissue within medial component of cavity; findings unchanged and c/w mycetoma. Ill-defined nodular consolidation in right posterior lung. S/p resection of right lower lobe. Unchanged extensive bronchiectasis. Mosaic attenuation c/w gas trapping 2/2 small airways disease.     CTA Chest (04/15/21): Images personally reviewed. Sequelae of right middle lobectomy and right lower lobe wedge resection. Chronic consolidation in the medial posterior right upper lobe and right lung apex with volume loss and architectural distortion of the right upper lobe. Compensatory hyperexpansion of the left lung with mosaic attenuation consistent with areas of air trapping. Extensive chronic bilateral bronchiectasis and bronchial thickening. Unchanged diffuse bilateral tree-in-bud nodularity.  Mildly enlarged and tortuous right bronchial artery, predominantly supplying the right upper lung, arising from the lateral descending aorta at the level of T6 (7:58, 4:38). No enlarged left-sided bronchial arteries. No CT evidence of intrapulmonary arteriovenous malformation.     Chest CT (02/25/21): Images personally reviewed.  Stable sequelae of right middle lobectomy and right lower lobe wedge resection. 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 Stable bilateral diffuse extensive bronchiectasis, bronchial wall thickening, and sequelae of chronic endobronchial infection.        CXR (05/02/20): Images not available for review. Per report, Visualized cardiac and mediastinal contours within normal limits allowing for mediastinal shift to the right. Circumferential right pleural fluid/thickening. Volume loss in the right lung consistent with known right lung resection. Irregular opacities throughout both lungs, right greater than left, consistent with known bronchiectasis.     Chest CT (11/10/19): Images personally reviewed. Stable sequelae of right middle lobectomy and right lower lobe wedge resection. Stable consolidation in the medial posterior right upper lobe and right lung apex, bilateral diffuse extensive bronchiectasis and bronchial wall thickening and unchanged sequelae of chronic endobronchial infection.    Chest CT (02/15/19): Images personally reviewed. Stable sequelae of right middle lobectomy and right lower lobe wedge resection.  Unchanged diffuse extensive bronchiectasis and bronchial wall thickening bilaterally.  Diffusely scattered tree-in-bud nodules in bilateral lung parenchyma with interval increase in tree-in-bud nodules in left lung; the  constellation of these findings are consistent with sequelae of worsening chronic endobronchial infection.    Chest CT (03/31/16): Images personally reviewed. Status post right middle and right lower lobectomies. Tiny loculated basilar right hydropneumothorax, probably chronic. Moderate cylindrical and varicoid bronchiectasis throughout both lungs, with associated scattered mucoid impaction, tree-in-bud opacities and scattered peribronchovascular and subpleural consolidation in the right upper lobe. 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 Two-vessel coronary atherosclerosis. Nonobstructing right nephrolithiasis.    Modified Barium Swallow (01/17/19): Shallow laryngeal penetration observed with thin liquids and puree.  No aspiration identified with any of the tested consistencies.    Immunization History   Administered Date(s) Administered    COVID-19 VAC,BIVALENT(60YR UP),PFIZER 12/17/2020    COVID-19 VAC,MRNA,TRIS(12Y UP)(PFIZER)(GRAY CAP) 12/01/2019, 05/03/2020, 08/13/2022    COVID-19 VACC,MRNA,(PFIZER)(PF) 04/20/2019, 05/13/2019, 05/18/2019, 12/01/2019    Covid-19 Vac, (23yr+) (Comirnaty) Mrna Pfizer  01/07/2022, 08/13/2022, 12/22/2022    HEPATITIS B VACCINE ADULT,IM(ENERGIX B, RECOMBIVAX) 05/13/2020, 06/11/2020, 11/13/2020    Hepatitis A (Adult) 05/13/2020, 11/13/2020    INFLUENZA IIV3 HIGH DOSE 79YRS+(FLUZONE) 12/22/2022    INFLUENZA QUAD ADJUVANTED 80YR UP(FLUAD) 11/25/2021    INFLUENZA TIV (TRI) 4MO+ W/ PRESERV (IM) 11/13/2010, 01/07/2012, 12/09/2017, 11/30/2019, 12/17/2020    INFLUENZA TIV (TRI) PF (IM)(HISTORICAL) 02/20/2012, 12/09/2012    Influenza Vaccine Quad(IM)6 MO-Adult(PF) 01/31/2016, 12/09/2017    Influenza Virus Vaccine, unspecified formulation 12/25/2014, 12/01/2016    Influenza Whole 12/21/2008    PNEUMOCOCCAL POLYSACCHARIDE 23-VALENT 10/22/2010, 02/07/2016, 10/07/2019    Pneumococcal Conjugate 13-Valent 04/28/2013    RSV VACCINE,ADJUVANTED(PF)(71YRS+)(AREXVY) 01/07/2022    TdaP 03/22/2012, 07/23/2021    ZOSTAVAX - ZOSTER VACCINE, LIVE, SQ 07/03/2014, 06/11/2017, 01/19/2018     Portions of this record have been created using Dragon dictation software. Dictation errors have been sought, but may not have been identified and corrected.

## 2023-04-27 NOTE — Unmapped (Signed)
[facility] Specialty Medication Onboarding    Specialty Medication: Pulmozyme  Prior Authorization: Not Required   Financial Assistance: No - patient doesn't qualify for additional assistance   Final Copay/Day Supply: $779.27 / 30    Insurance Restrictions: None     Notes to Pharmacist: None  Credit Card on File: yes    The triage team has completed the benefits investigation and has determined that the patient is able to fill this medication at [facility]. Please contact the patient to complete the onboarding or follow up with the prescribing physician as needed.

## 2023-04-28 NOTE — Unmapped (Unsigned)
 [facility]    Patient Onboarding/Medication Counseling    Jordan [name] is a 68 y.o. male with bronchiectasis who I am counseling today on continuation of therapy.  I am speaking to the patient.    Was a Nurse, learning disability used for this call? No    Verified patient's date of birth / HIPAA.    Specialty medication(s) to be sent: CF/Pulmonary/Asthma: -PULMOZYME (dornase alfa) 1mg /mL inhalation solution  -Nebulized colistin (150mg  vials) and supply kit (next cycle starts 3/1)      Non-specialty medications/supplies to be sent: neb cup, syringes, needles, saline      Medications not needed at this time: alcohol pads       The patient declined counseling on medication administration, missed dose instructions, goals of therapy, side effects and monitoring parameters, warnings and precautions, drug/food interactions, and storage, handling precautions, and disposal because they have taken the medication previously. The information in the declined sections below are for informational purposes only and was not discussed with patient.       Pulmozyme (dornase alfa)    Medication & Administration     Dosage: Inhale 1 ampule (2.5mg ) via nebulizer once daily.    Administration:   Administer Pulmozyme via eRapid Nebulizer System or via a jet nebulizer  Do not mix with other nebulized medications    Adherence/Missed dose instructions: If it is close to your next dose then wait until the next dose and resume your normal dosing schedule. Do not take more than your normal dose to make up for a missed dose.     Goals of Therapy     To improve pulmonary function    Side Effects & Monitoring Parameters     Voice alteration, Pharyngitis, Laryngitis, Rhinitis  Rash, Fever  Shortness of breath, Chest pain    The following side effects should be reported to the provider:  Coughing up blood       Contraindications, Warnings, & Precautions     Limited data in children under 69 years old    Drug/Food Interactions     Medication list reviewed in Epic. The patient was instructed to inform the care team before taking any new medications or supplements. No drug interactions identified.     Storage, Handling Precautions, & Disposal     Store Pulmozyme in the refrigerator but do not freeze.  Stable at room temperature up to 24 hours.      Current Medications (including OTC/herbals), Comorbidities and Allergies     Current Outpatient Medications   Medication Sig Dispense Refill    albuterol 2.5 mg /3 mL (0.083 %) nebulizer solution Inhale 3 mL by nebulization Three (3) times a day. 810 mL 3    albuterol HFA 90 mcg/actuation inhaler Inhale 2 puffs four (4) times a day as needed.      alcohol swabs (ALCOHOL PREP PADS) PadM Use as directed with inhaled antibiotics 100 each 5    ascorbic acid, vitamin C, (VITAMIN C) 1000 MG tablet Take 1 tablet (1,000 mg total) by mouth daily.      atorvastatin (LIPITOR) 10 MG tablet Take 1 tablet (10 mg total) by mouth daily.      azithromycin (ZITHROMAX) 250 MG tablet TAKE 1 TABLET BY MOUTH EVERY DAY 90 tablet 3    cholecalciferol, vitamin D3-50 mcg, 2,000 unit,, 50 mcg (2,000 unit) tablet Take 1 tablet (50 mcg total) by mouth daily.      colistimethate (COLYMYCIN) 150 mg injection Inject 2 mL sterile water  for injection to mix colistin vial, then draw up 2 mL (150mg ) and inhale 2 times a day, 28 days on and 28 days off. 60 each 5    dilTIAZem (CARDIZEM CD) 120 MG 24 hr capsule Take 1 capsule (120 mg total) by mouth daily. 90 capsule 3    dornase alfa (PULMOZYME) 1 mg/mL nebulizer solution Inhale 1 ampule (2.5 mg) daily. Use at least 30-60 minutes before airway clearance, or after airway clearance 75 mL 11    empty container (SHARPS CONTAINER) Misc USE AS DIRECTED 1 each 0    empty container Misc USE AS DIRECTED 1 each 2    empty container Misc Use as directed to dispose of needles. When full, make sure lid is closed tightly then dispose of container in trash. 1 each 2    ferrous sulfate 325 (65 FE) MG tablet Take 1 tablet (325 mg total) by mouth Two (2) times a day. BID      fluticasone propionate (FLONASE) 50 mcg/actuation nasal spray 1 spray into each nostril daily.      fluticasone-umeclidin-vilanter (TRELEGY ELLIPTA) 100-62.5-25 mcg inhaler Inhale 1 puff daily. 90 each 3    HYDROcodone-acetaminophen (NORCO) 7.5-325 mg per tablet Take 1 tablet by mouth three (3) times a day (at 6am, noon and 6pm).      montelukast (SINGULAIR) 10 mg tablet TAKE 1 TABLET BY MOUTH EVERY DAY AT NIGHT 90 tablet 3    multivitamin-minerals-lutein Tab Take 1 tablet by mouth daily.      nebulizers (LC PLUS) Misc Use with inhaled medications 1 each 5    nebulizers (LC PLUS) Misc Use as directed 4 each 3    nebulizers Misc use with nebulized medications 12 each 0    needle, disp, 21 G (BD REGULAR BEVEL NEEDLES) 21 gauge x 1 1/2 Ndle Use as directed with inhaled Colistin 100 each 3    NON FORMULARY Take 650 mg by mouth two (2) times a day. Brand: Citracal 650mg       omeprazole (PRILOSEC) 20 MG capsule Take 1 capsule (20 mg total) by mouth Two (2) times a day.      OXYGEN-AIR DELIVERY SYSTEMS MISC Inhale 2 mL.      sodium chloride (BD POSIFLUSH NORMAL SALINE 0.9) 0.9 % injection Inject 2mL of 0.9%NaCl into colistin vial & gently mix. After withdrawing colistin dose, add an additional 1mL of 0.9%NaCl to neb cup with the colistin dose. 180 mL 11    sodium chloride 10 % Nebu Measure and Inhale 5 mL by nebulization Two (2) times a day. Discard remaining amount and use a new vial for each dose. 900 mL 3    sterile water Soln Use 2mL to mix Colistin, then add additional 2mL to neb cup with 2mL of mixed colistin. Inhale twice daily 28 days on and 28 days off. 600 mL 5    syringe with needle (BD LUER-LOK SYRINGE) 3 mL 20 gauge x 1 1/2 Syrg For use with inhaled antibiotic (Colistin) 60 each 30    syringe with needle (BD LUER-LOK SYRINGE) 3 mL 21 gauge x 1 Syrg For use with inhaled antibiotic (Colistin) 60 each 5    tobramycin, PF, ([name]) 300 mg/5 mL nebulizer solution Inhale the contents of 1 ampule (300 mg total) by nebulization every twelve (12) hours. 28 days on and 28 days off. 280 mL 5    zinc gluconate 50 mg (7 mg elemental zinc) tablet Take 1 tablet (50 mg total) by mouth.  zoledronic acid-mannitol&water (RECLAST) 5 mg/100 mL PgBk Infusion       No current facility-administered medications for this visit.       Allergies   Allergen Reactions    Gentamicin Other (See Comments)     BALANCE ISSUES    Tobramycin Other (See Comments)     ototoxicity    Mirtazapine Other (See Comments)     Lower extremity swelling    Morphine Nausea And Vomiting       Patient Active Problem List   Diagnosis    Bronchiectasis (CMS-HCC)    Esophagitis    Pseudomonas aeruginosa infection    Daytime somnolence    Memory loss    Hypersomnia with sleep apnea    Essential hypertension    Osteoarthrosis    Dizziness    Excessive daytime sleepiness    Sleep disturbances    Loculated pleural effusion    Moderate Pharyngeal dysphagia    IDA (iron deficiency anemia)    Disequilibrium    Gastroesophageal reflux disease without esophagitis    Hoarseness    Dyspnea on exertion    Chronic respiratory failure with hypoxia (CMS-HCC)    Hemoptysis    Pulmonary aspergilloma (CMS-HCC)    Sensory neuropathy    Lumbosacral radiculopathy    Other osteoporosis without current pathological fracture       Medication list has been reviewed and updated in Epic: Yes    Allergies have been reviewed and updated in Epic: Yes    Appropriateness of Therapy     Acute infections noted within Epic:  MDR Pseudomonas  Patient reported infection:  head cold with congestion/cough. - patient reported to provider    Is the medication and dose appropriate based on diagnosis, medication list, comorbidities, allergies, medical history, patient???s ability to self-administer the medication, and therapeutic goals? Yes    Prescription has been clinically reviewed: Yes      Baseline Quality of Life Assessment      How many days over the past month did your bronchiectasis  keep you from your normal activities? For example, brushing your teeth or getting up in the morning. Patient declined to answer    Financial Information     Medication Assistance provided: None Required    Anticipated copay of $779.27 reviewed with patient. Verified delivery address.    Delivery Information     Scheduled delivery date: 05/18/23    Expected start date: 05/18/23    Medication will be delivered via UPS to the prescription address in [facility].  This shipment will not require a signature.      Explained the services we provide at [facility] and that each month we would call to set up refills.  Stressed importance of returning phone calls so that we could ensure they receive their medications in time each month.  Informed patient that we should be setting up refills 7-10 days prior to when they will run out of medication.  A pharmacist will reach out to perform a clinical assessment periodically.  Informed patient that a welcome packet, containing information about our pharmacy and other support services, a Notice of Privacy Practices, and a drug information handout will be sent.      The patient or caregiver noted above participated in the development of this care plan and knows that they can request review of or adjustments to the care plan at any time.      Patient or caregiver verbalized understanding of the above  information as well as how to contact the pharmacy at [phone] option 4 with any questions/concerns.  The pharmacy is open Monday through Friday 8:30am-4:30pm.  A pharmacist is available 24/7 via pager to answer any clinical questions they may have.    Patient Specific Needs     Does the patient have any physical, cognitive, or cultural barriers? No    Does the patient have adequate living arrangements? (i.e. the ability to store and take their medication appropriately) Yes    Did you identify any home environmental safety or security hazards? No    Patient prefers to have medications discussed with  Patient     Is the patient or caregiver able to read and understand education materials at a high school level or above? Yes    Patient's primary language is  English     Is the patient high risk? No    Does the patient have an additional or emergency contact listed in their chart? Yes    SOCIAL DETERMINANTS OF HEALTH     At the [facility], we have learned that life circumstances - like trouble affording food, housing, utilities, or transportation can affect the health of many of our patients.   That is why we wanted to ask: are you currently experiencing any life circumstances that are negatively impacting your health and/or quality of life? Patient declined to answer    Social Drivers of Health     Food Insecurity: No Food Insecurity (11/09/2022)    Hunger Vital Sign     Worried About Running Out of Food in the Last Year: Never true     Ran Out of Food in the Last Year: Never true   Internet Connectivity: Not on file   Housing/Utilities: Low Risk  (11/09/2022)    Housing/Utilities     Within the past 12 months, have you ever stayed: outside, in a car, in a tent, in an overnight shelter, or temporarily in someone else's home (i.e. couch-surfing)?: No     Are you worried about losing your housing?: No     Within the past 12 months, have you been unable to get utilities (heat, electricity) when it was really needed?: No   Tobacco Use: Low Risk  (04/20/2023)    Patient History     Smoking Tobacco Use: Never     Smokeless Tobacco Use: Never     Passive Exposure: Not on file   Transportation Needs: No Transportation Needs (11/09/2022)    PRAPARE - Transportation     Lack of Transportation (Medical): No     Lack of Transportation (Non-Medical): No   Alcohol Use: Not At Risk (01/22/2021)    Received from [facility], [facility]    AUDIT-C     Frequency of Alcohol Consumption: Never     Average Number of Drinks: Patient does not drink     Frequency of Binge Drinking: Never   Interpersonal Safety: Not on file   Physical Activity: Inactive (01/22/2021)    Received from [facility], [facility]    Exercise Vital Sign     Days of Exercise per Week: 0 days     Minutes of Exercise per Session: 0 min   Intimate Partner Violence: Unknown (06/24/2021)    Received from [facility], [facility]    HITS     Physically Hurt: Not on file     Insult or Talk Down To: Not on file     Threaten Physical Harm: Not on  file     Scream or Curse: Not on file   Stress: No Stress Concern Present (01/22/2021)    Received from [facility], [facility] - Occupational Stress Questionnaire     Feeling of Stress : Not at all   Substance Use: Not on file (01/25/2023)   Social Connections: Unknown (08/01/2021)    Received from [facility], [facility]    Social Network     Social Network: Not on file   Financial Resource Strain: Low Risk  (11/09/2022)    Overall Financial Resource Strain (CARDIA)     Difficulty of Paying Living Expenses: Not hard at all   Depression: Not at risk (06/26/2020)    Received from [facility], [facility]    Depression     Depression Screening: 0   Health Literacy: Not on file       Would you be willing to receive help with any of the needs that you have identified today? Not applicable       Oliva Bustard, PharmD  [facility] Specialty Pharmacist Health Literacy: Not on file       Would you be willing to receive help with any of the needs that you have identified today? {Yes/No/Not applicable:93005}       Oliva Bustard, PharmD  [facility] Specialty Pharmacist

## 2023-05-05 NOTE — Unmapped (Signed)
Called patient to discuss the BiWaze with him.  Unable to reach him and left a message.

## 2023-05-06 NOTE — Unmapped (Signed)
Called and discussed the BiWaze with patient.  He currently has an [place] and a [employer] vest system.  He uses his vest twice each day and feels it works well for him.  He is interested in finding out more about the payments and commitment associated with getting the BiWaze.  I let him know that I will check into it and call him back next week to discuss the options.      Also stated he is starting pulmonary rehab 2x per week and has been feeling well.

## 2023-05-17 MED FILL — BD LUER-LOK SYRINGE 3 ML 21 GAUGE X 1": 30 days supply | Qty: 60 | Fill #4

## 2023-05-17 MED FILL — LC PLUS MISC: 30 days supply | Qty: 2 | Fill #0

## 2023-05-17 MED FILL — COLISTIN (COLISTIMETHATE SODIUM) 150 MG SOLUTION FOR INJECTION: 60 days supply | Qty: 60 | Fill #5

## 2023-05-17 MED FILL — BD REGULAR BEVEL NEEDLES 21 GAUGE X 1 1/2": 30 days supply | Qty: 60 | Fill #1

## 2023-06-01 NOTE — Unmapped (Signed)
 Fax requested, signed renewal for Life 2000 to [employer]

## 2023-06-15 DIAGNOSIS — Z85828 Personal history of other malignant neoplasm of skin: Secondary | ICD-10-CM | POA: Diagnosis not present

## 2023-06-15 DIAGNOSIS — D485 Neoplasm of uncertain behavior of skin: Secondary | ICD-10-CM | POA: Diagnosis not present

## 2023-06-15 DIAGNOSIS — D044 Carcinoma in situ of skin of scalp and neck: Secondary | ICD-10-CM | POA: Diagnosis not present

## 2023-06-15 DIAGNOSIS — L570 Actinic keratosis: Secondary | ICD-10-CM | POA: Diagnosis not present

## 2023-06-15 DIAGNOSIS — L821 Other seborrheic keratosis: Secondary | ICD-10-CM | POA: Diagnosis not present

## 2023-06-16 DIAGNOSIS — J449 Chronic obstructive pulmonary disease, unspecified: Secondary | ICD-10-CM | POA: Diagnosis not present

## 2023-06-16 DIAGNOSIS — J9611 Chronic respiratory failure with hypoxia: Secondary | ICD-10-CM | POA: Diagnosis not present

## 2023-06-16 DIAGNOSIS — J479 Bronchiectasis, uncomplicated: Secondary | ICD-10-CM | POA: Diagnosis not present

## 2023-06-21 NOTE — Unmapped (Signed)
 Called patient about the BiWaze left message regarding the one year warranty.  Let him know we could pursue the [place]ara if he would like but that it is only available through [facility].

## 2023-06-23 NOTE — Unmapped (Signed)
 [facility] Refill Coordination Note    Specialty Medication(s) to be Shipped:   CF/Pulmonary/Asthma: Pulmozyme 2.5mg /2.33ml  Tobramycin 300mg /88mL, Pari nebulizer cup    Other medication(s) to be shipped:  sodium chloride 10 % Nebu     Jordan [name], DOB: 1955/03/27  Phone: [phone] (home)       All above HIPAA information was verified with patient.     Was a Nurse, learning disability used for this call? No    Completed refill call assessment today to schedule patient's medication shipment from the [facility]  [phone]).  All relevant notes have been reviewed.     Specialty medication(s) and dose(s) confirmed: Regimen is correct and unchanged.   Changes to medications: Jordan [name] reports no changes at this time.  Changes to insurance: No  New side effects reported not previously addressed with a pharmacist or physician: None reported  Questions for the pharmacist: No    Confirmed patient received a Conservation officer, historic buildings and a Surveyor, mining with first shipment. The patient will receive a drug information handout for each medication shipped and additional FDA Medication Guides as required.       DISEASE/MEDICATION-SPECIFIC INFORMATION        N/A    SPECIALTY MEDICATION ADHERENCE     Medication Adherence    Patient reported X missed doses in the last month: 0  Specialty Medication: dornase alfa: PULMOZYME 1 mg/mL nebulizer solution  Patient is on additional specialty medications: Yes  Additional Specialty Medications: tobramycin (PF) 300 mg/5 mL nebulizer solution ([name])  Patient Reported Additional Medication X Missed Doses in the Last Month: 0  Patient is on more than two specialty medications: No  Support network for adherence: family member              Were doses missed due to medication being on hold? No     dornase alfa: PULMOZYME 1 mg/mL nebulizer solution: 0 days of medicine on hand   tobramycin (PF) 300 mg/5 mL nebulizer solution ([name]): 0 days of medicine on hand         REFERRAL TO PHARMACIST     Referral to the pharmacist: Not needed      [facility]     Shipping address confirmed in Epic.     Cost and Payment: Patient has a copay of $291.16. They are aware and have authorized the pharmacy to charge the credit card on file.    Delivery Scheduled: Yes, Expected medication delivery date: 06/25/2023.     Medication will be delivered via UPS to the prescription address in Epic W[facility].    Jordan [name]   [facility]  Specialty Technician

## 2023-06-24 MED FILL — PULMOZYME 1 MG/ML SOLUTION FOR INHALATION: RESPIRATORY_TRACT | 30 days supply | Qty: 75 | Fill #1

## 2023-06-26 DIAGNOSIS — J479 Bronchiectasis, uncomplicated: Principal | ICD-10-CM

## 2023-06-26 DIAGNOSIS — J3089 Other allergic rhinitis: Principal | ICD-10-CM

## 2023-06-26 MED ORDER — ALBUTEROL SULFATE 2.5 MG/3 ML (0.083 %) SOLUTION FOR NEBULIZATION
Freq: Three times a day (TID) | RESPIRATORY_TRACT | 3 refills | 0.00 days
Start: 2023-06-26 — End: ?

## 2023-06-28 DIAGNOSIS — L723 Sebaceous cyst: Secondary | ICD-10-CM | POA: Diagnosis not present

## 2023-06-28 DIAGNOSIS — L089 Local infection of the skin and subcutaneous tissue, unspecified: Secondary | ICD-10-CM | POA: Diagnosis not present

## 2023-06-28 MED ORDER — ALBUTEROL SULFATE 2.5 MG/3 ML (0.083 %) SOLUTION FOR NEBULIZATION
Freq: Three times a day (TID) | RESPIRATORY_TRACT | 3 refills | 92.00 days | Status: CP
Start: 2023-06-28 — End: 2024-06-27

## 2023-07-02 NOTE — Unmapped (Signed)
 Received voice mail from patient that he needs to speak with Dr Sharion Davidson, and that I would be unable to help him with what he needs.  Sent mychart message to patient acknowledging receipt of voice mail, and advising that I will notify Dr Sharion Davidson of the request.

## 2023-07-17 DIAGNOSIS — J479 Bronchiectasis, uncomplicated: Secondary | ICD-10-CM | POA: Diagnosis not present

## 2023-07-17 DIAGNOSIS — J9611 Chronic respiratory failure with hypoxia: Secondary | ICD-10-CM | POA: Diagnosis not present

## 2023-07-17 DIAGNOSIS — J449 Chronic obstructive pulmonary disease, unspecified: Secondary | ICD-10-CM | POA: Diagnosis not present

## 2023-07-19 ENCOUNTER — Ambulatory Visit: Admit: 2023-07-19 | Discharge: 2023-07-20 | Payer: Medicare (Managed Care)

## 2023-07-19 DIAGNOSIS — J479 Bronchiectasis, uncomplicated: Principal | ICD-10-CM

## 2023-07-19 DIAGNOSIS — R0609 Other forms of dyspnea: Secondary | ICD-10-CM | POA: Diagnosis not present

## 2023-07-19 DIAGNOSIS — A498 Other bacterial infections of unspecified site: Principal | ICD-10-CM

## 2023-07-19 MED ORDER — COLISTIN (COLISTIMETHATE SODIUM) 150 MG SOLUTION FOR INJECTION
5 refills | 0.00000 days
Start: 2023-07-19 — End: ?

## 2023-07-19 MED ORDER — EMPTY CONTAINER
2 refills | 0.00000 days
Start: 2023-07-19 — End: ?

## 2023-07-19 NOTE — Unmapped (Signed)
 It was nice seeing you. We will follow up in 12 months.     We will not make any medication changes today. Please continue your Diltiazem  120 ER.

## 2023-07-19 NOTE — Unmapped (Signed)
 DIVISION OF CARDIOLOGY  [facility] , Floria Hurst        Date of Service: 07/19/2023    PCP: Referring Provider:   Donnis Galeazzi, MD  [address] [place] [address]  Phone: [phone]  Fax: [phone] Eliel Dudding, Jordan Cord, MD  [address]  [place],  [place] [address]  Phone: [phone]  Fax: [phone]     Assessment and Plan:   # Paroxysmal Supraventricular Tachycardia  # Palpitations - Resolved.   Jordan [name] is a 68 year old M with extensive pulmonary disease; bronchiectasis, chronic pseudomonal colonization, asperilloma, chronic hypoxic respiratory failure. He feels his disease is progressing with worsening breathlessness and coughing.  He uses 2L O2 on exertion and while sleeping. CV risk factors include HTN, OSA, age, male gender.  He has a prior dx of PSVT  late 2022 while in pulm rehab wearing a monitor.  Zio patch 10/2022 without any evidence of AFib, just short runs of SVT. He is on Dilt 120 ER for BP control and suppression of SVTs / palpitations.      Has a high bleed risk and has had hemoptysis with PA artery embolization-  as such, AC in Mr. Stranz would not be benign. If AF is found, we spoke about a possible Watchman procedure, wihich he is ammendable to to help decrease his risk of CVA.     TTE 12/2021 reassuringly showed a preserved L and R heart function.  As such, I do not see an need for a standing diuretic.     Plan:  -Continue Dilt 120 ER for HTN and addition of AV nodal blockade (SVTs).   -If AF is ever detected, consider Watchman given elevated bleed risk.   -We spoke about a loop recorder but will proceed with symptom monitoring at this time.     # Lower extremity Edema - resolved  # Chronic shortness of breath on exertion  # Chronic hypoxic respiratory failure on 2L at night and w/ activity  # Severe bronchiectasis and pseudomonal colonization.   Notes improvement in his lower extremity edema. Weights have been stable. TTE 01/12/2022 without any significnat R heart strain. This is quite reassuring, as such, I question the need for a daily diuretic (without L or R heart disease at this time).   Plan:  -Continue off Lasix .      Case was seen and staffed with Dr. Rodolph Clap    Primary prevention parameters:  - The 10-year ASCVD risk score (Arnett DK, et al., 2019) is: 15.3%    Values used to calculate the score:      Age: 36 years      Sex: Male      Is Non-Hispanic African American: No      Diabetic: No      Tobacco smoker: No      Systolic Blood Pressure: 120 mmHg      Is BP treated: Yes      HDL Cholesterol: 44 mg/dL      Total Cholesterol: 137 mg/dL    Note: For patients with SBP <90 or >200, Total Cholesterol <130 or >320, HDL <20 or >100 which are outside of the allowable range, the calculator will use these upper or lower values to calculate the patient???s risk score.    -   Lab Results   Component Value Date    LDL 76 01/27/2023       -   Lab Results   Component Value Date  A1C 5.3 01/05/2022     -   Lab Results   Component Value Date    CREATININE 1.05 01/29/2023     No follow-ups on file.    Subjective:        Reason for Consultation: PSVT    History of Present Illness: [name] is a very pleasant 68 y.o. male with a history of HTN, OSA, brochiectasis c/b chronic pseudomonas colonization and chronic hypoxic respiratory failure req 2L O2 with activity, history of RML lobectomy/RLL wedge recection, RUL aspergillooma .The patient is seen at the request of Jordan [name] Jordan [name] for evaluation of PSVT (Afib) which was discovered late 2022 during pulmonary rehab.    RPA embolization in December 2022 for hemoptysis.       Cardiology is asked to see Jordan [name] for a history of paroxysmal SVT.  Unclear which SVT this is as it has not been captured on Zio or ECG.  CT-A Chest 03/2021 demonstrated extensive bronchiectasis and signs of ongoing endobronchial infection.  Per chart review, PSVT managed with Dilt ER 120mg . While in pulmonary rehab 8/11, notes of 40 events of fast rate or afib.      October 3rd. Dilt ER 120.     No HF or CHF. He uses 2-3 thin pillows at night and has not changed them recently.  With oxygen he is able to do yard work. Even with O2 he has to takes breaks and has it down to 87%. 2L at home , 4L with activity. No CP or Chest heaviness.     He is unaware that he is in afib. He would see the pulm rehab monitor tell him he was in Afib.  His most recent hospital stays he was not in AFib and his heart rate was always below 90s. No Paplps, or pre-syncoal features.      Interval:  -No palpitations. He is not terribly active.   -Hospitalized 11 days for bronchiectasis flare.   -He is able to sleep at night. 2L O2 at night.   -No PND or orthopnea since his hospitalization.  -No LE edema. Stabilized off lasix .  After stopping his appetite stimulant  -BP is good 118/56 and stable at home as well. (115-120 SBP).   -Felt lightheaded early last week, after eating a meal, it went away.     INTERVAL 07/19/2023  History of Present Illness  Over the last six months, he has not experienced any complications with palpitations. His SVT is well controlled with Diltiazem  120 ER, maintaining heart rates in the 80s and blood pressures in the 120s. No palpitations, lightheadedness, dizziness, or falls are reported.    He occasionally notices a purple crescent shape on the bottom of his thumbs but has no other symptoms associated with this observation. No linear nail bed changes. Nothing on lower extremities.    He recalls a past issue with swelling, which resolved after discontinuing a medication prescribed for appetite. He is currently euvolemic without signs of right heart failure and off of his lasix .    SOCIAL:   He denies any history of smoking or drug use and reports minimal alcohol  consumption in the past. He lives at home with his wife in [place].      Cardiovascular History:  PSVT, unclear type. Likely MAT vs. AT     Fam Hx:  Father - CHF (late 65s).   Mother - AFib    I have independently reviewed of all the diagnostic studies. I have reviewed the old  medical records from [facility] prior to this clinic visit.     Cardiovascular Studies Date Comments     ECG 12/22/2021 NSR. LAFB.   Echo 01/12/2022 Summary    1. Technically difficult study due to chest wall/lung interference.  No  parasternal windows available.    2. The left ventricular systolic function is normal, LVEF is visually  estimated at 55%.    3. There is mild aortic regurgitation.    4. The right ventricle is normal in size, with normal systolic function.   Stress test     Cardiac catheterization     CYP2C19 Genotype     Electrophysiology      Cardiovascular Surgery     Peripheral Vascular Studies     I have personally reviewed the recent imaging studies on this patient.    Past medical history:  Patient Active Problem List   Diagnosis    Bronchiectasis    Esophagitis    Pseudomonas aeruginosa infection    Daytime somnolence    Memory loss    Hypersomnia with sleep apnea    Essential hypertension    Osteoarthrosis    Dizziness    Excessive daytime sleepiness    Sleep disturbances    Loculated pleural effusion    Moderate Pharyngeal dysphagia    IDA (iron deficiency anemia)    Disequilibrium    Gastroesophageal reflux disease without esophagitis    Hoarseness    Dyspnea on exertion    Chronic respiratory failure with hypoxia    Hemoptysis    Pulmonary aspergilloma    Sensory neuropathy    Lumbosacral radiculopathy    Other osteoporosis without current pathological fracture       Medications:   Patient's Medications   New Prescriptions    No medications on file   Previous Medications    ALBUTEROL  2.5 MG /3 ML (0.083 %) NEBULIZER SOLUTION    INHALE 3 ML BY NEBULIZATION THREE (3) TIMES A DAY.    ALBUTEROL  HFA 90 MCG/ACTUATION INHALER    Inhale 2 puffs four (4) times a day as needed.    ALCOHOL  SWABS  (ALCOHOL  PREP PADS) PADM    Use as directed with inhaled antibiotics    ASCORBIC ACID , VITAMIN C , (VITAMIN C ) 1000 MG TABLET    Take 1 tablet (1,000 mg total) by mouth daily.    ATORVASTATIN  (LIPITOR ) 10 MG TABLET    Take 1 tablet (10 mg total) by mouth daily.    AZITHROMYCIN  (ZITHROMAX ) 250 MG TABLET    TAKE 1 TABLET BY MOUTH EVERY DAY    CHOLECALCIFEROL , VITAMIN D3-50 MCG, 2,000 UNIT,, 50 MCG (2,000 UNIT) TABLET    Take 1 tablet (50 mcg total) by mouth daily.    COLISTIMETHATE  (COLYMYCIN) 150 MG INJECTION    Inject 2 mL sterile water  for injection to mix colistin vial, then draw up 2 mL (150mg ) and inhale 2 times a day, 28 days on and 28 days off.    DILTIAZEM  (CARDIZEM  CD) 120 MG 24 HR CAPSULE    Take 1 capsule (120 mg total) by mouth daily.    DORNASE ALFA  (PULMOZYME ) 1 MG/ML NEBULIZER SOLUTION    Inhale 1 ampule (2.5 mg) via nebulizer daily. Use at least 30-60 minutes before airway clearance, or after airway clearance.    EMPTY CONTAINER (SHARPS CONTAINER) MISC    USE AS DIRECTED    EMPTY CONTAINER MISC    USE AS DIRECTED    EMPTY CONTAINER MISC    Use as directed to  dispose of needles. When full, make sure lid is closed tightly then dispose of container in trash.    FERROUS SULFATE  325 (65 FE) MG TABLET    Take 1 tablet (325 mg total) by mouth Two (2) times a day. BID    FLUTICASONE  PROPIONATE (FLONASE ) 50 MCG/ACTUATION NASAL SPRAY    1 spray into each nostril daily.    FLUTICASONE -UMECLIDIN-VILANTER (TRELEGY ELLIPTA ) 100-62.5-25 MCG INHALER    Inhale 1 puff daily.    HYDROCODONE -ACETAMINOPHEN  (NORCO ) 7.5-325 MG PER TABLET    Take 1 tablet by mouth three (3) times a day (at 6am, noon and 6pm).    MONTELUKAST  (SINGULAIR ) 10 MG TABLET    TAKE 1 TABLET BY MOUTH EVERY DAY AT NIGHT    MULTIVITAMIN-MINERALS-LUTEIN TAB    Take 1 tablet by mouth daily.    NEBULIZERS (LC PLUS) MISC    Use with inhaled medications    NEBULIZERS (LC PLUS) MISC    Use as directed    NEBULIZERS MISC    use with nebulized medications    NEEDLE, DISP, 21 G (BD REGULAR BEVEL NEEDLES) 21 GAUGE X 1 1/2 NDLE    Use as directed with inhaled Colistin    NON FORMULARY    Take 650 mg by mouth two (2) times a day. Brand: Citracal 650mg     OMEPRAZOLE (PRILOSEC) 20 MG CAPSULE    Take 1 capsule (20 mg total) by mouth Two (2) times a day.    OXYGEN-AIR DELIVERY SYSTEMS MISC    Inhale 2 mL.    SODIUM CHLORIDE  (BD POSIFLUSH NORMAL SALINE 0.9) 0.9 % INJECTION    Inject 2mL of 0.9%NaCl into colistin vial & gently mix. After withdrawing colistin dose, add an additional 1mL of 0.9%NaCl to neb cup with the colistin dose.    SODIUM CHLORIDE  10 % NEBU    Measure and Inhale 5 mL by nebulization Two (2) times a day. Discard remaining amount and use a new vial for each dose.    STERILE WATER  SOLN    Use 2mL to mix Colistin, then add additional 2mL to neb cup with 2mL of mixed colistin. Inhale twice daily 28 days on and 28 days off.    SYRINGE WITH NEEDLE (BD LUER-LOK SYRINGE) 3 ML 20 GAUGE X 1 1/2 SYRG    For use with inhaled antibiotic (Colistin)    SYRINGE WITH NEEDLE (BD LUER-LOK SYRINGE) 3 ML 21 GAUGE X 1 SYRG    For use with inhaled antibiotic (Colistin)    TOBRAMYCIN , PF, ([name] ) 300 MG/5 ML NEBULIZER SOLUTION    Inhale the contents of 1 ampule (300 mg total) by nebulization every twelve (12) hours. 28 days on and 28 days off.    ZINC  GLUCONATE 50 MG (7 MG ELEMENTAL ZINC ) TABLET    Take 1 tablet (50 mg total) by mouth.    ZOLEDRONIC  ACID-MANNITOL &WATER  (RECLAST ) 5 MG/100 ML PGBK    Infusion   Modified Medications    No medications on file   Discontinued Medications    No medications on file       Allergies:  Allergies   Allergen Reactions    Gentamicin Other (See Comments)     BALANCE ISSUES    Tobramycin  Other (See Comments)     ototoxicity    Mirtazapine  Other (See Comments)     Lower extremity swelling    Morphine Nausea And Vomiting       Social History:  He  reports that he has never smoked. He has  never used smokeless tobacco. He reports that he does not drink alcohol  and does not use drugs.    Family History:  His family history includes Alzheimer's disease in his mother; Asthma in his son; Bronchiectasis  in his brother, brother, and daughter; Diabetes in his father; Heart failure in his father; Liver disease in his father; Stroke in his mother.    Review of Systems  10 systems were reviewed and negative except as noted in HPI.      Objective:       Physical Exam  There were no vitals taken for this visit.   Wt Readings from Last 3 Encounters:   04/20/23 79.3 kg (174 lb 14.4 oz)   02/22/23 78 kg (172 lb)   02/15/23 78.4 kg (172 lb 12.8 oz)     General:  Alert, no distress. Wife at side.    Eyes:  Intact, sclerae anicteric.   Neck: No carotid bruit.    Respiratory:   CTAB bilaterally with normal WOB.   Cardiovascular:  JVD not visitible at 90 degrees, RRR without m/r/g.  trace edema bilaterally.     Most recent labs   Lab Results   Component Value Date    Sodium 143 01/29/2023    Sodium Whole Blood 137 03/24/2015    Potassium 3.9 01/29/2023    Potassium, Bld 3.9 03/24/2015    Chloride 104 01/29/2023    Chloride 93 (L) 08/13/2013    CO2 30.6 01/29/2023    CO2 32 (H) 08/13/2013    BUN 23 01/29/2023    BUN 24 (H) 08/13/2013    Creatinine Whole Blood, POC 1.2 04/15/2021    Creatinine Whole Blood, POC 0.4 (L) 06/12/2014    Creatinine 1.05 01/29/2023    Magnesium  1.5 (L) 12/22/2021    Magnesium  2.0 08/13/2013     Lab Results   Component Value Date    HGB 13.1 01/27/2023    Hemoglobin 14.8 03/24/2015    MCV 91.3 01/27/2023    MCV 88 08/13/2013    Platelet 172 01/27/2023    Platelet 292 08/13/2013     Lab Results   Component Value Date    Cholesterol, Total 137 01/27/2023    Triglycerides 86 01/27/2023    Cholesterol, HDL 44 01/27/2023    Cholesterol, Non-HDL, Calculated 93 01/27/2023    Cholesterol, LDL, Calculated 76 01/27/2023    Hemoglobin A1C 5.3 01/05/2022    TSH 1.334 10/14/2021    PRO-BNP 721.0 (H) 01/26/2023    INR 0.95 04/14/2017    INR 1.0 08/03/2013          A total of 25 minutes was spent on this visit reviewing previous notes, counseling the patient on this clinical conditions, ordering tests , adjusting meds, and documenting the findings in the note.     Patient note was created using dragon dictation. Any errors in syntax or proofreading may not have been identified and edited on initial review prior to signing this note.    Palma Bob, M.D.  [facility] Cardiology Fellow, PGY-5

## 2023-07-19 NOTE — Unmapped (Signed)
 [facility] Specialty and Home Delivery Pharmacy Refill Coordination Note    Specialty Medication(s) to be Shipped:   CF/Pulmonary/Asthma: Pulmozyme  1mg /ml  Colistimethate  150 mg    Other medication(s) to be shipped: empty container Misc     Jordan [name], DOB: [birth year]  Phone: [phone] (home)       All above HIPAA information was verified with patient.     Was a Nurse, learning disability used for this call? No    Completed refill call assessment today to schedule patient's medication shipment from the [facility] Delivery [facility]  [phone]).  All relevant notes have been reviewed.     Specialty medication(s) and dose(s) confirmed: Regimen is correct and unchanged.   Changes to medications: Myrtle reports no changes at this time.  Changes to insurance: No  New side effects reported not previously addressed with a pharmacist or physician: None reported  Questions for the pharmacist: No    Confirmed patient received a Conservation officer, historic buildings and a Surveyor, mining with first shipment. The patient will receive a drug information handout for each medication shipped and additional FDA Medication Guides as required.       DISEASE/MEDICATION-SPECIFIC INFORMATION        For patients on injectable medications: Patient currently has 0 doses left.  Next injection is scheduled for 07/26/23.    SPECIALTY MEDICATION ADHERENCE     Medication Adherence    Patient reported X missed doses in the last month: 0  Specialty Medication: PULMOZYME  1 mg/mL nebulizer solution (dornase alfa )  Patient is on additional specialty medications: Yes  Additional Specialty Medications: colistimethate  150 mg injection (COLYMYCIN)  Patient Reported Additional Medication X Missed Doses in the Last Month: 0  Patient is on more than two specialty medications: No  Any gaps in refill history greater than 2 weeks in the last 3 months: no  Demonstrates understanding of importance of adherence: yes  Support network for adherence: family member Were doses missed due to medication being on hold? No    PULMOZYME  1  mg/ml: 7 days of medicine on hand   colistimethate  150  mg/ml: 0 days of medicine on hand       REFERRAL TO PHARMACIST     Referral to the pharmacist: Not needed      [facility]     Shipping address confirmed in Epic.     Cost and Payment: Unable to determine copay at this time as the prescription requires a prior authorization/financial assistance. Patient is aware that shipment will be held until copay has been approved and payment information collected, if needed.    Delivery Scheduled: Yes, Expected medication delivery date: 07/22/23.     Medication will be delivered via UPS to the prescription address in Epic WAM.    Stephen Ehrlich   [facility] Delivery Pharm[facility]  Specialty Technician

## 2023-07-20 MED ORDER — COLISTIN (COLISTIMETHATE SODIUM) 150 MG SOLUTION FOR INJECTION
INTRAMUSCULAR | 5 refills | 0.00000 days | Status: CP
Start: 2023-07-20 — End: ?
  Filled 2023-07-21: qty 60, 60d supply, fill #0

## 2023-07-20 MED ORDER — EMPTY CONTAINER
2 refills | 0.00000 days | Status: CP
Start: 2023-07-20 — End: ?
  Filled 2023-07-21: qty 1, 120d supply, fill #0

## 2023-07-21 MED FILL — PULMOZYME 1 MG/ML SOLUTION FOR INHALATION: RESPIRATORY_TRACT | 30 days supply | Qty: 75 | Fill #2

## 2023-07-23 ENCOUNTER — Ambulatory Visit
Admit: 2023-07-23 | Discharge: 2023-08-06 | Disposition: A | Discharge status: Home / Self Care | Payer: Medicare (Managed Care) | Source: Ambulatory Visit

## 2023-07-23 ENCOUNTER — Inpatient Hospital Stay
Admit: 2023-07-23 | Discharge: 2023-08-06 | Disposition: A | Discharge status: Home / Self Care | Payer: Medicare (Managed Care) | Source: Ambulatory Visit

## 2023-07-23 ENCOUNTER — Ambulatory Visit: Admit: 2023-07-23 | Payer: Medicare (Managed Care)

## 2023-07-23 ENCOUNTER — Inpatient Hospital Stay: Admit: 2023-07-23 | Discharge: 2023-07-23 | Payer: Medicare (Managed Care)

## 2023-07-23 ENCOUNTER — Ambulatory Visit
Admit: 2023-07-23 | Discharge: 2023-07-23 | Payer: Medicare (Managed Care) | Attending: Internal Medicine | Primary: Internal Medicine

## 2023-07-23 DIAGNOSIS — J471 Bronchiectasis with (acute) exacerbation: Principal | ICD-10-CM

## 2023-07-23 DIAGNOSIS — J4550 Severe persistent asthma, uncomplicated: Principal | ICD-10-CM

## 2023-07-23 DIAGNOSIS — M810 Age-related osteoporosis without current pathological fracture: Secondary | ICD-10-CM | POA: Diagnosis not present

## 2023-07-23 DIAGNOSIS — Z885 Allergy status to narcotic agent status: Secondary | ICD-10-CM | POA: Diagnosis not present

## 2023-07-23 DIAGNOSIS — Z888 Allergy status to other drugs, medicaments and biological substances status: Secondary | ICD-10-CM | POA: Diagnosis not present

## 2023-07-23 DIAGNOSIS — J45909 Unspecified asthma, uncomplicated: Secondary | ICD-10-CM | POA: Diagnosis not present

## 2023-07-23 DIAGNOSIS — G8929 Other chronic pain: Secondary | ICD-10-CM | POA: Diagnosis not present

## 2023-07-23 DIAGNOSIS — Z902 Acquired absence of lung [part of]: Secondary | ICD-10-CM | POA: Diagnosis not present

## 2023-07-23 DIAGNOSIS — I4719 Other supraventricular tachycardia: Secondary | ICD-10-CM | POA: Diagnosis not present

## 2023-07-23 DIAGNOSIS — M5417 Radiculopathy, lumbosacral region: Secondary | ICD-10-CM | POA: Diagnosis not present

## 2023-07-23 DIAGNOSIS — J449 Chronic obstructive pulmonary disease, unspecified: Secondary | ICD-10-CM | POA: Diagnosis not present

## 2023-07-23 DIAGNOSIS — Z881 Allergy status to other antibiotic agents status: Secondary | ICD-10-CM | POA: Diagnosis not present

## 2023-07-23 DIAGNOSIS — G629 Polyneuropathy, unspecified: Secondary | ICD-10-CM | POA: Diagnosis not present

## 2023-07-23 DIAGNOSIS — D72829 Elevated white blood cell count, unspecified: Secondary | ICD-10-CM | POA: Diagnosis not present

## 2023-07-23 DIAGNOSIS — A498 Other bacterial infections of unspecified site: Secondary | ICD-10-CM | POA: Diagnosis not present

## 2023-07-23 DIAGNOSIS — I471 Supraventricular tachycardia, unspecified: Secondary | ICD-10-CM | POA: Diagnosis not present

## 2023-07-23 DIAGNOSIS — R918 Other nonspecific abnormal finding of lung field: Secondary | ICD-10-CM | POA: Diagnosis not present

## 2023-07-23 DIAGNOSIS — J4489 Other specified chronic obstructive pulmonary disease: Secondary | ICD-10-CM | POA: Diagnosis not present

## 2023-07-23 DIAGNOSIS — K219 Gastro-esophageal reflux disease without esophagitis: Secondary | ICD-10-CM | POA: Diagnosis not present

## 2023-07-23 DIAGNOSIS — J984 Other disorders of lung: Secondary | ICD-10-CM | POA: Diagnosis not present

## 2023-07-23 DIAGNOSIS — J9621 Acute and chronic respiratory failure with hypoxia: Secondary | ICD-10-CM | POA: Diagnosis not present

## 2023-07-23 DIAGNOSIS — Z1152 Encounter for screening for COVID-19: Secondary | ICD-10-CM | POA: Diagnosis not present

## 2023-07-23 DIAGNOSIS — B441 Other pulmonary aspergillosis: Secondary | ICD-10-CM | POA: Diagnosis not present

## 2023-07-23 DIAGNOSIS — Z228 Carrier of other infectious diseases: Secondary | ICD-10-CM | POA: Diagnosis not present

## 2023-07-23 DIAGNOSIS — I10 Essential (primary) hypertension: Secondary | ICD-10-CM | POA: Diagnosis not present

## 2023-07-23 DIAGNOSIS — J479 Bronchiectasis, uncomplicated: Secondary | ICD-10-CM | POA: Diagnosis not present

## 2023-07-23 DIAGNOSIS — Z79899 Other long term (current) drug therapy: Secondary | ICD-10-CM | POA: Diagnosis not present

## 2023-07-23 DIAGNOSIS — R0902 Hypoxemia: Secondary | ICD-10-CM | POA: Diagnosis not present

## 2023-07-23 DIAGNOSIS — R600 Localized edema: Secondary | ICD-10-CM | POA: Diagnosis not present

## 2023-07-23 DIAGNOSIS — J929 Pleural plaque without asbestos: Secondary | ICD-10-CM | POA: Diagnosis not present

## 2023-07-23 LAB — COMPREHENSIVE METABOLIC PANEL
ALBUMIN: 3.7 g/dL (ref 3.4–5.0)
ALKALINE PHOSPHATASE: 96 U/L (ref 46–116)
ALT (SGPT): 16 U/L (ref 10–49)
ANION GAP: 15 mmol/L — ABNORMAL HIGH (ref 5–14)
AST (SGOT): 18 U/L (ref <=34)
BILIRUBIN TOTAL: 0.2 mg/dL — ABNORMAL LOW (ref 0.3–1.2)
BLOOD UREA NITROGEN: 27 mg/dL — ABNORMAL HIGH (ref 9–23)
BUN / CREAT RATIO: 25
CALCIUM: 10 mg/dL (ref 8.7–10.4)
CHLORIDE: 102 mmol/L (ref 98–107)
CO2: 25.4 mmol/L (ref 20.0–31.0)
CREATININE: 1.07 mg/dL (ref 0.73–1.18)
EGFR CKD-EPI (2021) MALE: 75 mL/min/1.73m2 (ref >=60)
GLUCOSE RANDOM: 114 mg/dL (ref 70–179)
POTASSIUM: 4.1 mmol/L (ref 3.4–4.8)
PROTEIN TOTAL: 7.8 g/dL (ref 5.7–8.2)
SODIUM: 142 mmol/L (ref 135–145)

## 2023-07-23 LAB — CBC W/ AUTO DIFF
BASOPHILS ABSOLUTE COUNT: 0 10*9/L (ref 0.0–0.1)
BASOPHILS RELATIVE PERCENT: 0.5 %
EOSINOPHILS ABSOLUTE COUNT: 0.1 10*9/L (ref 0.0–0.5)
EOSINOPHILS RELATIVE PERCENT: 1 %
HEMATOCRIT: 40.4 % (ref 39.0–48.0)
HEMOGLOBIN: 13.8 g/dL (ref 12.9–16.5)
LYMPHOCYTES ABSOLUTE COUNT: 0.4 10*9/L — ABNORMAL LOW (ref 1.1–3.6)
LYMPHOCYTES RELATIVE PERCENT: 5.3 %
MEAN CORPUSCULAR HEMOGLOBIN CONC: 34.1 g/dL (ref 32.0–36.0)
MEAN CORPUSCULAR HEMOGLOBIN: 31.1 pg (ref 25.9–32.4)
MEAN CORPUSCULAR VOLUME: 91.2 fL (ref 77.6–95.7)
MEAN PLATELET VOLUME: 8.6 fL (ref 6.8–10.7)
MONOCYTES ABSOLUTE COUNT: 1 10*9/L — ABNORMAL HIGH (ref 0.3–0.8)
MONOCYTES RELATIVE PERCENT: 12.4 %
NEUTROPHILS ABSOLUTE COUNT: 6.7 10*9/L (ref 1.8–7.8)
NEUTROPHILS RELATIVE PERCENT: 80.8 %
NUCLEATED RED BLOOD CELLS: 0 /100{WBCs} (ref <=4)
PLATELET COUNT: 200 10*9/L (ref 150–450)
RED BLOOD CELL COUNT: 4.43 10*12/L (ref 4.26–5.60)
RED CELL DISTRIBUTION WIDTH: 12.6 % (ref 12.2–15.2)
WBC ADJUSTED: 8.3 10*9/L (ref 3.6–11.2)

## 2023-07-23 LAB — SEDIMENTATION RATE: ERYTHROCYTE SEDIMENTATION RATE: 75 mm/h — ABNORMAL HIGH (ref 0–20)

## 2023-07-23 LAB — C-REACTIVE PROTEIN: C-REACTIVE PROTEIN: 25.2 mg/L — ABNORMAL HIGH (ref <=10.0)

## 2023-07-23 MED ORDER — EPINEPHRINE 0.3 MG/0.3 ML INJECTION, AUTO-INJECTOR
Freq: Once | INTRAMUSCULAR | 0 refills | 0.00000 days | Status: SS | PRN
Start: 2023-07-23 — End: ?

## 2023-07-23 MED ORDER — DUPIXENT 200 MG/1.14 ML SUBCUTANEOUS SYRINGE
Freq: Once | SUBCUTANEOUS | 0 refills | 1.00000 days | Status: SS
Start: 2023-07-23 — End: 2023-07-23

## 2023-07-23 MED ADMIN — sodium chloride 10 % NEBULIZER solution 5 mL: 5 mL | RESPIRATORY_TRACT | @ 22:00:00

## 2023-07-23 MED ADMIN — predniSONE (DELTASONE) tablet 40 mg: 40 mg | ORAL | @ 22:00:00 | Stop: 2023-07-28

## 2023-07-23 MED ADMIN — albuterol 2.5 mg /3 mL (0.083 %) nebulizer solution 2.5 mg: 2.5 mg | RESPIRATORY_TRACT | @ 18:00:00 | Stop: 2023-07-23

## 2023-07-23 MED ADMIN — HYDROcodone-acetaminophen (HYCET) 7.5-325 mg/15 mL solution 7.5 mg of hydrocodone: 7.5 mg | ORAL | @ 22:00:00 | Stop: 2023-08-06

## 2023-07-23 MED ADMIN — sodium chloride 7% NEBULIZER solution 3 mL: 3 mL | RESPIRATORY_TRACT | @ 18:00:00 | Stop: 2023-07-23

## 2023-07-23 MED ADMIN — albuterol 2.5 mg /3 mL (0.083 %) nebulizer solution 2.5 mg: 2.5 mg | RESPIRATORY_TRACT | @ 21:00:00

## 2023-07-23 NOTE — Unmapped (Addendum)
 VISIT SUMMARY:    You came in today for a routine follow-up due to worsening symptoms related to your idiopathic bronchiectasis and chronic hypoxemic respiratory failure. You have been experiencing a persistent and severe cough, increased sputum production, wheezing, and chest pain. Your oxygen levels were low, and you felt exhausted, prompting a discussion about hospitalization and long-term management options.    YOUR PLAN:    -IDIOPATHIC BRONCHIECTASIS WITH CHRONIC PSEUDOMONAS COLONIZATION: Idiopathic bronchiectasis is a condition where the airways in your lungs are abnormally widened, leading to a build-up of mucus and increased risk of infection. Your symptoms have worsened, and we will admit you to [facility] for IV antibiotics, steroids, and airway clearance. We will also start the paperwork for Otuvera, a nebulized therapy to reduce inflammation and swelling in your airways, and send a prescription for an asthma biologic to the [facility] specialty pharmacy.    -CHRONIC HYPOXEMIC RESPIRATORY FAILURE: Chronic hypoxemic respiratory failure is a long-term condition where your body doesn't get enough oxygen. Your symptoms have worsened, and we will admit you to [facility] for management with supplemental oxygen and monitoring. We also discussed potential long-term treatments, including injection therapies and nebulized therapy, to help manage your respiratory symptoms.    INSTRUCTIONS:    You will be admitted to [facility] for further treatment and monitoring. We will start you on IV antibiotics, steroids, and airway clearance to manage your bronchiectasis. Additionally, we will initiate the paperwork for Ohtuvayre  nebulized therapy and send a prescription for an asthma biologic to the [facility] specialty pharmacy. Please follow up with your healthcare team as directed after your hospital stay.    Please note that if your next visit is scheduled after September 21, 2023, we may need to contact you to adjust your visit date/time if the doctor's schedule changes. We will do this as far in advance as possible to avoid any inconvenience on your schedule.    Symptoms of a bronchiectasis exacerbation: 48 hours of at least two of the following:  Increased cough  Change in volume or appearance of sputum  Increased sputum purulence  Worsening shortness of breath and/or exercise tolerance  Fatigue and/or malaise  Coughing up blood (hemoptysis)    If you are experiencing some of these symptoms:  Increase the frequency and/or intensity of your airway clearance  Try to submit a sputum sample for bacterial and AFB cultures (at [facility] or locally)  Reach out to me or your local physician.  If you need antibiotics, recommend treating for 14 days.    Please bring any new airway clearance equipment to your next visit to ensure that you are using and caring for it properly.    Thank you for allowing me to be a part of your care. Please call the clinic with any questions.    Thank you for your visit to the [facility] Pulmonary Clinics. You may receive a survey from [facility] regarding your visit today, and we are eager to use this feedback to improve your experience. Thank you for taking the time to fill it out.    Between appointments, you can reach us  at these numbers:  For appointments: [phone]  For my nurse, Leota Randy: [phone]  Fax: [phone]  For urgent issues after hours: Hospital Operator @ [phone] & ask for Pulmonary Fellow on call  For Respiratory Therapy/Airway clearance needs or questions, call respiratory therapist Sonny Dust [phone]    My [facility] Chart is for non-urgent messages. This means you have a  simple medical question that does not require an immediate response.     If you need immediate attention, call 911.     Responses may take up to 3 business days. Your message will be read by your provider or another medical team member who may respond on your provider???s behalf.    Some questions cannot be answered through messages in My [facility] Chart. Depending on your question, your provider???s office may ask you to schedule an appointment.     Information sent through My [facility] Chart will become part of your medical record.    For further information, check out the websites below:    General Information:  [facility] Bronchiectasis/NTM Care and Research Center: [email or web address]  Information about bronchiectasis (BE): Speak Up In BE - Page for People With Bronchiectasis (speakupinbronchiectasis.com)   Information about non-tuberculous Mycobacterial Infections:  https://www.ntminfo.org   Videos from the patient session of the World Bronchiectasis Conference in [place] on October 03, 2016 can be watched here (TrustyNews.es).   Copy for Individuals with Bronchiectasis and/or NTM through the Bronchiectasis and NTM Association: ClassPreviews.com.br   [employer] NTM Patient Education Program: https://nyulangone.org/care-services/bronchiectasis-ntm-program/ntm-patient-education-program  First [place] American Bronchiectasis and NTM Patient Education Program on Mar 02, 2023: https://players.BlogSelections.co.uk    Airway Clearance, Exercise, and Care:  Impact Airway Clearance Education: http://www.impact-be.com  Bronchiectasis Toolbox (information about airway clearance): DiningCalendar.de  Exercise program by Asthma [facility] Lung [place]: http://www.brown.com/  Be Clear with Bronchiectasis by Rollie Clipper, MPH (health educator and patient): https://www.letsbecleartoday.com/    Research Opportunities:  Interested in our bronchiectasis and NTM clinical trials: https://go.ToyArticles.ca      Interested in other clinical trials and research opportunities?  www.clinicaltrials.gov  The Rare Diseases Clinical Research Network [facility]) Genetic Disorders of Mucociliary Clearance Consortium [facility]) Contact Registry is a way for patients with disorders of mucociliary clearance (such as bronchiectasis) and their family members to learn about research studies they may be able to join. Participation is completely voluntary and you may choose to withdraw at any time. There is no cost to join the [employer]. For more information or to join the registry please go to the following website:  [email or web address]

## 2023-07-23 NOTE — Unmapped (Signed)
 I saw, evaluated and I discussed the patient with the resident.   I agree with the findings, assessment and plan of the resident outlined below.

## 2023-07-23 NOTE — Unmapped (Signed)
 Patient was a direct admit this afternoon for bronchiectasis. Pt on 5LO2. Pt oriented to unit and plan of care. Continent of bowel and bladder. IV antibiotics initiated. Wife is at bedside. Universal fall precautions in place.     12hr chart check completed.       Problem: Adult Inpatient Plan of Care  Goal: Plan of Care Review  Outcome: Ongoing - Unchanged  Goal: Patient-Specific Goal (Individualized)  Outcome: Ongoing - Unchanged  Goal: Absence of Hospital-Acquired Illness or Injury  Outcome: Ongoing - Unchanged  Goal: Optimal Comfort and Wellbeing  Outcome: Ongoing - Unchanged  Goal: Readiness for Transition of Care  Outcome: Ongoing - Unchanged  Goal: Rounds/Family Conference  Outcome: Ongoing - Unchanged

## 2023-07-23 NOTE — Unmapped (Addendum)
 [place] [facility]    Assessment & Plan:   Patient:Leonard Katheleen Palmer ([birth year])  Reason for visit: Mr.Landin is a 68 y.o.male who returns for follow-up of bronchiectasis secondary to unknown cause (idiopathic) with Pseudomonas colonization, chronic hypoxemic respiratory failure, and asthma/COPD overlap.   Assessment & Plan  Idiopathic bronchiectasis with chronic Pseudomonas colonization, acute on chronic hypoxemic respiratory failure  Exacerbation with increased coughing, wheezing, chest pain, and sputum production (yellow to light brown, thicker than usual). Oxygen saturation at 86% on room air, improved to 92% on 6L oxymizer.  Exhausted and desires hospitalization. Discussed long-term management options including injection therapies for asthma and nebulized therapy with Ohtuvayre . Asthma biologic therapy may take up to six months for maximum benefit and do not suppress the immune system. Ohtuvayre  is a newer nebulized therapy approved for COPD, aimed at decreasing inflammation and swelling in the airways. Both therapies require specialty medication approval and paperwork.  - Admit to [facility] for IV antibiotics, steroids, and airway clearance.  Treatment plan below.  - Initiate Ohtuvayre  paperwork for nebulized therapy to decrease inflammation and swelling in the airways related to COPD.  - Send prescription for Dupixent  to [facility] specialty pharmacy to address eosinophilic asthma.  - Given albuterol  and hypertonic saline 7% while in clinic since he held his airway clearance this morning based on instructions sent by mychart.    Admission Date Requested: 07/23/23    Reason for Admission: bronchiectasis exacerbation in setting of COPD/asthma overlap,     Antibiotics:  Based on prior culture data (OPF, PsA) and allergy profile, favor treating with:  IV ceftazidime   Oral levofloxacin   Inhaled colistin  May need single lumen PICC line for IV antibiotics based on clinical response assessed on 07/26/23. Duration to be determined based on clinical response and improvement in inflammatory markers.  Continue chronic azithromycin  for anti-inflammatory effect.    Other medications:  Prednisone  40 mg daily x 5 days. Taper by 10 mg every 5 days.  Convert Trelegy to formulary equivalent.  May change to Trelegy 200 after discharge.  COVID vaccine prior to discharge    Airway clearance: Needs aggressive airway clearance 4 times per day.  Nebulized hypertonic saline 10%; pre-treatment with albuterol .  For mechanical clearance, prefer metaneb and Vibralung.  Pulmozyme  2.5 mg nebulized daily    Consults:  Pulmonology    Labs:  On admission, CBC-D, CMP, CRP, ESR.  Sputum for bacterial and AFB cultures (NTM screening) sent from clinic.   CRP and ESR every 4-5 days to trend (including as outpatient).  [facility] Guideline for Monitoring Outpatient Intravenous and Oral Antimicrobial Therapy recommended monitoring labs ([email or web address])    Imaging:  None    Other Testing:  Patient  may  need FVL performed while in the hospital depending on how he feels on Monday. If continuing IV antibiotics, would obtain prior to leaving hospital.    [facility]):  Patient is not a candidate for [facility] due to distance.    Dispo:   Patient will need follow-up appointment with me in 2-3 months with FVL.  If he feels markedly better on Monday 07/26/23, I am okay with him discharging on prednisone  taper, levofloxacin , and colistin nebs.   Otherwise, would then ask for PICC placement for outpatient IV antibiotics.  Patient does not need to remain in the hospital for the entire course of IV antibiotics.   Please reach out to [facility] team (myself, Leota Randy RN, Britta Candy  Virgel Griffes CPP) prior to discharge to ensure that home infusion orders are correct and that we are comfortable with patient discharging home on IVs at that time.  We will follow monitoring labs.  Please use [email or web address] for home infusion orders. We will follow monitoring labs. Does not need OPAT.  Do not discharge Friday through Sunday or on a holiday as no outpatient clinical staff available to assist if problems arise with home health/home infusion.     I am available to assist the primary team with his care during hospitalization.     Jaython will return to clinic in 2-3 months for follow-up with pre-bronchodilator spirometry and sputum cultures.  He will call or send me a message via MyChart if questions or concerns arise before this visit. [place] is in agreement with the above plan.    Subjective:   HPI:   History of Present Illness  OMARIUS DIAS is a 68 year old male with idiopathic bronchiectasis and chronic hypoxemic respiratory failure who presents for routine follow-up.    He has experienced a worsening cough that began earlier this week. Initially, he attributed his symptoms to fatigue from travel, but the cough persisted and worsened over the following days. He describes episodes of continuous coughing, which were severe enough to cause vomiting before leaving for the appointment. He has been using cough drops and cough medicine to manage the symptoms.    His sputum is yellow to light brown, without any dark brown or green discoloration, and it is thicker than usual. He also reports increased wheezing and chest aching. He experienced a significant coughing fit during the drive to the appointment.    His oxygen saturation was low, starting at 86% and only increasing to 92% with six liters of oxygen via oxymizer. He has not been using his rescue albuterol  as much as he probably needs to, approximately once a week.    No recent illness in his family or among his grandchildren. He reports no episodes of reflux or heartburn, but notes that coughing causes a burning sensation. He has experienced increased sinus drainage recently, which he describes as a 'snot river.'    Respiratory Symptoms:   Cough: productive of yellow brown sputum that is thicker and sticker than prior.    Nocturnal awakenings: present.  Wheezing: present.  Chest tightness: present.  Rescue albuterol  use: 1x/wk.  Pleurisy: absent.  Hemoptysis: absent.  MMRC: 4 = I am too breathless to leave the house or I am breathless when dressing.    Exacerbations:   Number of exacerbations treated in the past year: 3  Dates of exacerbations: 03/2018, 07/2018, 02/2019, 09/2019, 12/2019, 10/2020, 01/2021, 07/2021, 03/2022, 04/2022, 07/2022, 10/2022, 01/2023, 02/2023   Number of hospitalizations for exacerbations in the past 2 years: 2  Dates of hospitalizations:  01/2015, 03/2015, 01/2016, 03/2017, 09/2019, 10/2020, 02/2021, 10/2022, 01/2023 (rhino/enterovirus)     Pulmonary Therapies:   Airway clearance: Pulmozyme  daily, HTS 10% bid, percussive vest bid   Inhaled antibiotics: tobi  - due to start colitin.  Inhalers: Trelegy 100  Chronic antibiotics: azithromycin  250 mg daily  Exercise:  Hasn't walked on treadmill in long time. .  Pulmonary Rehab: Telehealth pulmonary rehab July 2022   Supplemental oxygen: Baseline of 2L continuous at rest and with sleep. Increases to 4-5L with activity and uses Life2000.  NIPPV: No    Review of Systems  Remainder of a complete review of systems was negative unless mentioned above.    Past Medical History:  Diagnosis Date    Abscess of lung 11/06/2010    CT Chest 10/22/10 12/05/2010 right thoracotomy resection of right middle lobe and resection of right lower lobe abscess     Arthritis     back    Biceps tendon tear 2013    left side    Bronchiectasis     chronic psuedomonas infection    Degenerative joint disease of left acromioclavicular joint     GERD (gastroesophageal reflux disease)     Hypertension     Obstructive sleep apnea on CPAP 03/14/13    AHI 33.5, on BiPAP 12/8 based on sleep study 09/2013    Pneumonia 2012    Lung abcess     PONV (postoperative nausea and vomiting)     Prostate cancer 09/09/2017    Rotator cuff injury left    Vertigo        Current Outpatient Medications   Medication Sig Dispense Refill    albuterol  2.5 mg /3 mL (0.083 %) nebulizer solution INHALE 3 ML BY NEBULIZATION THREE (3) TIMES A DAY. 825 mL 3    albuterol  HFA 90 mcg/actuation inhaler Inhale 2 puffs four (4) times a day as needed.      alcohol  swabs  (ALCOHOL  PREP PADS) PadM Use as directed with inhaled antibiotics 100 each 5    ascorbic acid , vitamin C , (VITAMIN C ) 1000 MG tablet Take 1 tablet (1,000 mg total) by mouth daily.      atorvastatin  (LIPITOR ) 10 MG tablet Take 1 tablet (10 mg total) by mouth daily.      azithromycin  (ZITHROMAX ) 250 MG tablet TAKE 1 TABLET BY MOUTH EVERY DAY 90 tablet 3    cholecalciferol , vitamin D3-50 mcg, 2,000 unit,, 50 mcg (2,000 unit) tablet Take 1 tablet (50 mcg total) by mouth daily.      colistimethate  (COLYMYCIN) 150 mg injection Inject 2 mL sterile water  for injection to mix colistin vial, then draw up 2 mL (150mg ) and inhale 2 times a day, 28 days on and 28 days off. 60 each 5    dilTIAZem  (CARDIZEM  CD) 120 MG 24 hr capsule Take 1 capsule (120 mg total) by mouth daily. 90 capsule 3    dornase alfa  (PULMOZYME ) 1 mg/mL nebulizer solution Inhale 1 ampule (2.5 mg) via nebulizer daily. Use at least 30-60 minutes before airway clearance, or after airway clearance. 75 mL 11    doxycycline (VIBRA-TABS) 100 MG tablet take 1 tablet by mouth twice a day for 10 days      empty container (SHARPS CONTAINER) Misc USE AS DIRECTED 1 each 0    empty container Misc USE AS DIRECTED 1 each 2    empty container Misc Use as directed to dispose of needles. When full, make sure lid is closed tightly then dispose of container in trash. 1 each 2    ferrous sulfate  325 (65 FE) MG tablet Take 1 tablet (325 mg total) by mouth Two (2) times a day. BID      fluticasone  propionate (FLONASE ) 50 mcg/actuation nasal spray 1 spray into each nostril daily.      fluticasone -umeclidin-vilanter (TRELEGY ELLIPTA ) 100-62.5-25 mcg inhaler Inhale 1 puff daily. 90 each 3    HYDROcodone -acetaminophen  (NORCO ) 7.5-325 mg per tablet Take 1 tablet by mouth three (3) times a day (at 6am, noon and 6pm).      montelukast  (SINGULAIR ) 10 mg tablet TAKE 1 TABLET BY MOUTH EVERY DAY AT NIGHT 90 tablet 3    multivitamin-minerals-lutein Tab Take 1 tablet by mouth  daily.      nebulizers (LC PLUS) Misc Use with inhaled medications 1 each 5    nebulizers (LC PLUS) Misc Use as directed 4 each 3    nebulizers Misc use with nebulized medications 12 each 0    needle, disp, 21 G (BD REGULAR BEVEL NEEDLES) 21 gauge x 1 1/2 Ndle Use as directed with inhaled Colistin 100 each 3    NON FORMULARY Take 650 mg by mouth two (2) times a day. Brand: Citracal 650mg       omeprazole (PRILOSEC) 20 MG capsule Take 1 capsule (20 mg total) by mouth Two (2) times a day.      OXYGEN-AIR DELIVERY SYSTEMS MISC Inhale 2 mL.      sodium chloride  (BD POSIFLUSH NORMAL SALINE 0.9) 0.9 % injection Inject 2mL of 0.9%NaCl into colistin vial & gently mix. After withdrawing colistin dose, add an additional 1mL of 0.9%NaCl to neb cup with the colistin dose. 180 mL 11    sodium chloride  10 % Nebu Measure and Inhale 5 mL by nebulization Two (2) times a day. Discard remaining amount and use a new vial for each dose. 900 mL 3    sterile water  Soln Use 2mL to mix Colistin, then add additional 2mL to neb cup with 2mL of mixed colistin. Inhale twice daily 28 days on and 28 days off. 600 mL 5    syringe with needle (BD LUER-LOK SYRINGE) 3 mL 20 gauge x 1 1/2 Syrg For use with inhaled antibiotic (Colistin) 60 each 30    syringe with needle (BD LUER-LOK SYRINGE) 3 mL 21 gauge x 1 Syrg For use with inhaled antibiotic (Colistin) 60 each 5    tobramycin , PF, ([name] ) 300 mg/5 mL nebulizer solution Inhale the contents of 1 ampule (300 mg total) by nebulization every twelve (12) hours. 28 days on and 28 days off. 280 mL 5    zinc  gluconate 50 mg (7 mg elemental zinc ) tablet Take 1 tablet (50 mg total) by mouth.      zoledronic  acid-mannitol &water  (RECLAST ) 5 mg/100 mL PgBk Infusion       No current facility-administered medications for this visit.       Allergies  Reviewed on 07/23/2023        Reactions Comments    Gentamicin Other (See Comments) BALANCE ISSUES    Tobramycin  Other (See Comments) ototoxicity    Mirtazapine  Other (See Comments) Lower extremity swelling    Morphine Nausea And Vomiting             Social History     Tobacco Use    Smoking status: Never    Smokeless tobacco: Never   Vaping Use    Vaping status: Never Used   Substance Use Topics    Alcohol  use: No     Alcohol /week: 0.0 standard drinks of alcohol     Drug use: No       Objective:   BP 141/71 (BP Position: Sitting)  - Pulse 107  - Temp 36.8 ??C (98.2 ??F) (Temporal)  - Ht 170.5 cm (5' 7.13)  - Wt 80.7 kg (178 lb)  - SpO2 91% Comment: pt is on 2 liters of oxygen - BMI 27.77 kg/m??   Physical Exam  GEN: Appears fatigued but nontoxic.  CHEST: Crackles at left lung base and middle. Bronchial squeak in right lung. Diminished breath sound over right base c/w prior resection.  Several coughing spells during visit causing marked facial redness.  HEART: RRR w/ nl S1 and S2.  Diagnostic Review:     Pulmonary Function Testing:       FVC (% predicted) FEV1 (% predicted) FEV1/FVC   11/10/2013 3.19 L (68%) 2.16 L (61%)  68%   02/23/2014 3.50 L (76%)  2.23 L (64%)  64%   07/12/2014  2.94 L (66%)  2.02 L (58%)  69%   01/15/2015  2.82 L (62%)  1.79 L (52%)  63%   02/08/2015  3.04 L (66%)  2.06 L (59%)  68%   04/02/2015  2.86 L (63%)  1.83 L (53%)  64%   12/10/2015  2.77 L (62%)  1.62 L (48%)  58%   01/27/2016  2.42 L (54%)  1.55 L (46%)  64%   03/24/2016  3.36 L (75%)  1.99 L (59%)  59%   07/21/2016  2.56 L (57%)  1.54 L (46%)  60%   12/29/2016  2.80 L (63%)  1.57 L (47%)  56%   04/06/2017  2.61 L (59%)  1.46 L (44%)  56% 07/06/2017  2.56 L (58%)  1.49 L (45%)  58%   10/12/2017  2.63 L (59%)  1.47 L (44%)  56%   01/18/2018  2.68 L (63%)  1.36 L (42%)  51%   05/17/2018 2.49 L (57%)  1.25 L (38%)  50%   09/26/2018 2.43 L (57%)  1.26 L (39%)  52%   01/26/2019 2.16 L (51%)  1.16 L (36%)  54%   06/06/2019 2.47 L (59%) 1.11 L (34%) 45%   11/10/19 2.14 L (50%) 1.20 L (37%) 56%   02/13/20 2.01 L (48%) 1.08 L (34%) 54%   05/02/20 2.29 L (54%) 1.11 L (34%) 48%   07/02/20 (pre) 2.11 L (51%) 1.17 L (37%) 56%   07/02/20 (post) 2.26 L (54%) [+7.2%] 1.22 L (38%) [+4.0%] 54%   10/31/20 2.46 L (59%) 1.17 L (37%) 48%   07/01/21 2.33 L (56%) 1.13 L (36%) 48%   09/29/21 1.92 L (47%) 1.01 L (32%) 53%   11/21/21 2.10 L (51%) 1.03 L (33%) 49%   04/14/22 2.09 L (54%) 1.05 L (35%) 50%   08/04/22 1.98 L (51%) 1.00 L (34%) 50%   10/23/22 2.07 L (53%) 0.95 L (32%) 46%   11/17/22 2.04 L (53%) 1.04 L (35%) 51%   01/26/23 1.97 L (54%) 0.97 L (35%) 50%   04/20/23 1.97 L (54%) 1.03 L (37%) 52%   07/23/23 1.70 L (47%) 0.93 L (34%) 55%     Measures are consistent with moderately severe airway obstruction and restriction. Measures are decreased from baseline.      Cultures:       Source Bacterial Culture AFB Smear AFB Culture   04/02/15 Sputum 2+ OPF; 1+ Probably mPsA Negative Negative   07/02/15 Sputum 4+ OPF; 3+ mPsA; 3+ sPsA; 1+ Steno Negative Negative   01/23/16 Sputum 2+ OPF; 1+ mPsA; 1+ sPsA - -   03/24/16 Sputum 3+ OPF; 3+ mPsA; 3+ sPsA Negative Overgrown   07/21/16 Sputum 4+ OPF; 4+ mPsA; 2+ sPsA Negative Negative   12/29/16 Sputum 4+ OPF; 3+ mPsA; 3+ sPsA Negative Actinomadura sp   04/06/17 Sputum 3+ OPF; 4+ mPsA Negative Negative   07/06/17 Sputum 4+ OPF; 3+ mPsA Negative Negative   10/12/17 Sputum 4+ OPF; 3+ mPsA Negative Negative   01/18/18 Sputum OPF Negative Negative   05/17/18 Sputum 4+ OPF; 3+ mPsA - -   09/26/18 Sputum 4+ OPF; 3+ mPsA Negative Negative   01/26/19 Sputum 4+ OPF; 2+ mPsA Negative  Negative   02/15/19 Sputum 3+ OPF; 3+ mPsA Negative Negative   06/06/19 Sputum 3+ OPF; 3+ sPsA; 3+ mPsA Negative Negative   11/10/19 Sputum 2+ OPF; 3+ mPsA Negative Negative   02/13/20 Sputum OPF Negative Negative   07/02/20 Sputum 3+ OPF; 1+sPsA; 3+ mPsA negative negative   10/31/20 Sputum 4+ OPF; 2+ sPsA; 4+ mPsA negative negative   11/17/20 Sputum 3+ OPF; 3+ mPsA negative negative   02/21/21 Sputum Few PsA negative negative   02/27/21 Sputum Scant C albicans and light A fumigatus - -   03/18/21 Sputum 2+ OPF; 1+ mPsA negative negative   03/26/21 Sputum 4+ OPF; 3+ mPsA negative negative   09/29/21 Sputum 4+ OPF; 4+ sPsA; 4+ mPsA negative negative   04/14/22 Sputum 4+ OPF; 1+ mPsA negative negative   08/04/22 Sputum OPF - -   10/23/22 Sputum 4+ OPF; 2+ mPsA negative negative   11/07/22 Sputum 2+ OPF; 1+ mPsA; 1+ sPsA negative negative   01/26/23 Sputum 1+ OPF; 1+ mPsA negative negative   01/27/23 Sputum 3+ OPF; 2+ mPsA - -      Radiology:   Chest CT (02/25/21): Images personally reviewed.  Stable sequelae of right middle lobectomy and right lower lobe wedge resection. Stable consolidation in the medial posterior right upper lobe but increased consolidation of the right lung apex with area demonstrating reverse crescent sign suggestive of aspergilloma. Stable bilateral diffuse extensive bronchiectasis, bronchial wall thickening, and sequelae of chronic endobronchial infection.    CTA Chest (04/15/21): Images personally reviewed. Sequelae of right middle lobectomy and right lower lobe wedge resection. Chronic consolidation in the medial posterior right upper lobe and right lung apex with volume loss and architectural distortion of the right upper lobe. Compensatory hyperexpansion of the left lung with mosaic attenuation consistent with areas of air trapping. Extensive chronic bilateral bronchiectasis and bronchial thickening. Unchanged diffuse bilateral tree-in-bud nodularity.  Mildly enlarged and tortuous right bronchial artery, predominantly supplying the right upper lung, arising from the lateral descending aorta at the level of T6 (7:58, 4:38). No enlarged left-sided bronchial arteries. No CT evidence of intrapulmonary arteriovenous malformation.      Chest CT (11/14/21): Images personally reviewed. I agree with radiology interpretation that he continues to have extensive cavitary consolidation in right apex with areas of dependent soft tissue within medial component of cavity; findings unchanged and c/w mycetoma. Ill-defined nodular consolidation in right posterior lung. S/p resection of right lower lobe. Unchanged extensive bronchiectasis. Mosaic attenuation c/w gas trapping 2/2 small airways disease.      Chest CT (06/15/22): Images personally reviewed.  Minimal change from prior CT though there is a slightly smaller focus of soft tissue/consolidation, possibly reflective of mycetoma, within complex cavitary lesion in the apex of the right lung.  New small focus of endobronchial debris within the cystic airspaces of the lingula.   Results  LABS  Eosinophils: 400 (01/2023)  Eosinophils: 500 (10/2022)  Eosinophils: 200 (2021)      Bronchiectasis Evaluation   IgG with subclasses:   Lab Results   Component Value Date/Time    IGGT 996 04/28/2012 06:21 AM    IGG1 402 04/28/2012 06:21 AM    IGG2 399 04/28/2012 06:21 AM    IGG3 148.0 (H) 04/28/2012 06:21 AM    IGG4 47.7 04/28/2012 06:21 AM     IgA (09/21/11): 283 No results found for: IGA  IgM (09/21/11): 52 No results found for: IGM  IgE:   Lab Results   Component Value Date/Time  IGE 16.6 02/13/2020 10:24 AM     Specific Titers: No results found for: TETANUSAB, TETGV, DIPHTERIAAB, DIPGV, S. pneumonia    HIV: No results found for: HIV  Autoimmune: No results found for: ANA, ANATITER1, PAT1, ANATITER2, PAT2, DSDNAAB, ENAS, RF, CCPAB, CCPIGG  ANCA: No results found for: ANCA, IFA, PR3QT, MPO, MPOQT  CBC-D:   Lab Results   Component Value Date/Time    WBC 8.9 01/27/2023 06:18 AM    WBC 7.9 08/13/2013 03:57 AM    HGB 13.1 01/27/2023 06:18 AM    HGB 14.8 03/24/2015 05:31 PM    HCT 38.8 (L) 01/27/2023 06:18 AM    HCT 30.8 (L) 08/13/2013 03:57 AM    PLT 172 01/27/2023 06:18 AM    PLT 292 08/13/2013 03:57 AM    NEUTROABS 7.2 01/27/2023 06:18 AM    NEUTROABS 4.9 06/21/2013 09:31 PM    LYMPHSABS 1.1 01/27/2023 06:18 AM    LYMPHSABS 1.9 06/21/2013 09:31 PM    EOSABS 0.0 01/27/2023 06:18 AM    EOSABS 0.2 06/21/2013 09:31 PM    BASOSABS 0.0 01/27/2023 06:18 AM    BASOSABS 0.0 06/21/2013 09:31 PM     Alpha-1 level: No results found for: A1TRYP  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

## 2023-07-23 NOTE — Unmapped (Addendum)
 [facility] Medicine   History and Physical       Assessment and Plan     Jordan [name] is a 68 y.o. male who is presenting to [facility] with Bronchiectasis, in the setting of the following pertinent/contributing co-morbidities: idiopathic bronchiectasis with chronic pseudomonas colonization, right middle lobe lobectomy and right lower lobe wedge resection chronic hypoxemia on 2 liters of O2 at home, HTN, chronic peripheral neuropathy/lumbosacral radiculopathy on chronic norco  at home, osteoporosis on yearly zoledronic  acid .      Bronchiectasis   Illness that poses a threat to life or bodily function without appropriate treatment.  Per pulm recs, will start ceftazidime  and levaquin  for antimicrobial therapy.  Given admission for rhinovirus 01/2023, viral illness can certainly be at play also so have ordered an RPP.  Airway clearance with metanbeb/volera, hypertonic saline 10% nebs QID with albuterol  pre-treatment; continue dornase and formulary equivalent of home inhalers.  Continue montelukast  and azithromycin .  Prednisone  taper ordered as per pulm recs.  Follow-up CMP, CBC, ESR, CRP.  PICC line ordered.  On 6 liters of O2 right now, goal spo2 88-92%.  Uses 2-3 liters at home.  Anticipate with aggressive treatment as above, his O2 requirements will continue to go down.      Asthma with allergic component:  on dupixent  at home; continue singulair  and inhalers here.      HTN:  continue home diltiazem .  BP well-controlled here.      Chronic pain:  from lumbosacral radiculopathy - on chronic norco  at home which has been continued.      GERD:  continue PPI    Secondary/Additional Active Problems:  Assessment & Plan  Bronchiectasis      Essential hypertension    Gastroesophageal reflux disease without esophagitis    Acute on chronic respiratory failure with hypoxemia      Sensory neuropathy    Prophylaxis  -low-risk, padua <4     Diet  -Nutrition Therapy Regular/House    Code Status / HCDM  -Full Code, Discussed with patient at the time of admission   -  HCDM (HCPOA): Whittaker, Hlinka - [phone]    Anticipated Medically Ready for Discharge: Anticipated in 2-4 Days        HPI      Jordan [name] is a 68 y.o. male who is presenting to [facility] with Bronchiectasis exacerbation.  The patient has idiopathic bronchiectasis with chronic pseudomonas colonization, right middle lobe lobectomy and right lower lobe wedge resection, chronic hypoxemia on 2 liters of O2 at home, HTN, chronic peripheral neuropathy/lumbosacral radiculopathy on chronic norco  at home, osteoporosis on yearly zoledronic  acid, here with management of worsening cough with sputum production, and shortness of breath.  The patient states that starting at the beginning of this week, he had severe coughing spells which caused him to vomit.  The cough was also accompanied by yellow to light brown sputum production (no blood) and increase in thickness as well.  The patient also said this was accompanied by wheezing.  Due to these symptoms, the patient saw his pulmonologist Dr. Sharion Davidson in clinic today and it was noted that his oxygen saturation was 86% and needed to go up to 6 liters to increase it to goal of 88-92%.  The patient was then sent for direct admission due to c/f bronchiectasis exacerbation.  His pulmonary/bronchiectasis history is as follows:   Exacerbations:   Number of exacerbations treated in the past year: 3  Dates of exacerbations: 03/2018, 07/2018, 02/2019, 09/2019,  12/2019, 10/2020, 01/2021, 07/2021, 03/2022, 04/2022, 07/2022, 10/2022, 01/2023, 02/2023   Number of hospitalizations for exacerbations in the past 2 years: 2  Dates of hospitalizations:  01/2015, 03/2015, 01/2016, 03/2017, 09/2019, 10/2020, 02/2021, 10/2022, 01/2023 (rhino/enterovirus)      Pulmonary Therapies:   Airway clearance: Pulmozyme  daily, HTS 10% bid, percussive vest bid   Inhaled antibiotics: tobi  - due to start colitin.  Inhalers: Trelegy 100  Chronic antibiotics: azithromycin  250 mg daily  Exercise:  Hasn't walked on treadmill in long time. .  Pulmonary Rehab: Telehealth pulmonary rehab July 2022   Supplemental oxygen: Baseline of 2L continuous at rest and with sleep. Increases to 4-5L with activity and uses Life2000.  NIPPV: No           Med Rec Confidence   I reviewed the Medication List. The current list is Accurate    Physical Exam   Temp:  [36.8 ??C (98.2 ??F)-37.3 ??C (99.2 ??F)] 37.3 ??C (99.2 ??F)  Pulse:  [107-109] 109  Resp:  [19] 19  BP: (130-141)/(71-76) 130/76  SpO2:  [91 %-93 %] 93 %  Body mass index is 27.88 kg/m??.  General - seated in bed, awake, no apparent distress   CV - RRR, no murmurs   Pulm - decreased breath sounds on the right; coarse sounds on the left; no appreciable wheezing

## 2023-07-24 MED ADMIN — guaiFENesin (ROBITUSSIN) oral syrup: 200 mg | ORAL | @ 03:00:00

## 2023-07-24 MED ADMIN — fluticasone furoate-vilanterol (BREO ELLIPTA) 100-25 mcg/dose inhaler 1 puff: 1 | RESPIRATORY_TRACT | @ 14:00:00

## 2023-07-24 MED ADMIN — cholecalciferol (vitamin D3 25 mcg (1,000 units)) tablet 50 mcg: 50 ug | ORAL | @ 14:00:00

## 2023-07-24 MED ADMIN — multivitamins, therapeutic with minerals tablet 1 tablet: 1 | ORAL | @ 14:00:00

## 2023-07-24 MED ADMIN — cefTAZidime (FORTAZ) 2 g in sodium chloride 0.9 % (NS) 100 mL IVPB-MBP: 2 g | INTRAVENOUS | @ 17:00:00 | Stop: 2023-08-06

## 2023-07-24 MED ADMIN — ferrous sulfate tablet 325 mg: 325 mg | ORAL | @ 23:00:00

## 2023-07-24 MED ADMIN — HYDROcodone-acetaminophen (HYCET) 7.5-325 mg/15 mL solution 7.5 mg of hydrocodone: 7.5 mg | ORAL | @ 23:00:00 | Stop: 2023-08-06

## 2023-07-24 MED ADMIN — cefTAZidime (FORTAZ) 2 g in sodium chloride 0.9 % (NS) 100 mL IVPB-MBP: 2 g | INTRAVENOUS | Stop: 2023-08-06

## 2023-07-24 MED ADMIN — sodium chloride 10 % NEBULIZER solution 5 mL: 5 mL | RESPIRATORY_TRACT | @ 17:00:00

## 2023-07-24 MED ADMIN — sodium chloride 10 % NEBULIZER solution 5 mL: 5 mL | RESPIRATORY_TRACT | @ 14:00:00

## 2023-07-24 MED ADMIN — sodium chloride 10 % NEBULIZER solution 5 mL: 5 mL | RESPIRATORY_TRACT | @ 01:00:00

## 2023-07-24 MED ADMIN — montelukast (SINGULAIR) tablet 10 mg: 10 mg | ORAL

## 2023-07-24 MED ADMIN — umeclidinium (INCRUSE ELLIPTA) 62.5 mcg/actuation inhaler 1 puff: 1 | RESPIRATORY_TRACT | @ 14:00:00

## 2023-07-24 MED ADMIN — albuterol 2.5 mg /3 mL (0.083 %) nebulizer solution 2.5 mg: 2.5 mg | RESPIRATORY_TRACT | @ 20:00:00

## 2023-07-24 MED ADMIN — dornase alfa (PULMOZYME) 1 mg/mL solution 2.5 mg: 2.5 mg | RESPIRATORY_TRACT | @ 14:00:00

## 2023-07-24 MED ADMIN — albuterol 2.5 mg /3 mL (0.083 %) nebulizer solution 2.5 mg: 2.5 mg | RESPIRATORY_TRACT | @ 14:00:00

## 2023-07-24 MED ADMIN — sodium chloride 10 % NEBULIZER solution 5 mL: 5 mL | RESPIRATORY_TRACT | @ 20:00:00

## 2023-07-24 MED ADMIN — pantoprazole (Protonix) EC tablet 20 mg: 20 mg | ORAL | @ 14:00:00

## 2023-07-24 MED ADMIN — atorvastatin (LIPITOR) tablet 10 mg: 10 mg | ORAL

## 2023-07-24 MED ADMIN — colistimethate (COLYMYCIN) 150 mg in sodium chloride (NS) 4 mL inhalation: 150 mg | RESPIRATORY_TRACT | @ 01:00:00

## 2023-07-24 MED ADMIN — ferrous sulfate tablet 325 mg: 325 mg | ORAL | @ 14:00:00

## 2023-07-24 MED ADMIN — HYDROcodone-acetaminophen (HYCET) 7.5-325 mg/15 mL solution 7.5 mg of hydrocodone: 7.5 mg | ORAL | @ 17:00:00 | Stop: 2023-08-06

## 2023-07-24 MED ADMIN — predniSONE (DELTASONE) tablet 40 mg: 40 mg | ORAL | @ 14:00:00 | Stop: 2023-07-28

## 2023-07-24 MED ADMIN — levoFLOXacin (LEVAQUIN) tablet 750 mg: 750 mg | ORAL | Stop: 2023-08-06

## 2023-07-24 MED ADMIN — colistimethate (COLYMYCIN) 150 mg in sodium chloride (NS) 4 mL inhalation: 150 mg | RESPIRATORY_TRACT | @ 14:00:00

## 2023-07-24 MED ADMIN — cefTAZidime (FORTAZ) 2 g in sodium chloride 0.9 % (NS) 100 mL IVPB-MBP: 2 g | INTRAVENOUS | @ 10:00:00 | Stop: 2023-08-06

## 2023-07-24 MED ADMIN — albuterol 2.5 mg /3 mL (0.083 %) nebulizer solution 2.5 mg: 2.5 mg | RESPIRATORY_TRACT | @ 01:00:00

## 2023-07-24 MED ADMIN — levoFLOXacin (LEVAQUIN) tablet 750 mg: 750 mg | ORAL | @ 10:00:00 | Stop: 2023-08-06

## 2023-07-24 MED ADMIN — HYDROcodone-acetaminophen (HYCET) 7.5-325 mg/15 mL solution 7.5 mg of hydrocodone: 7.5 mg | ORAL | @ 10:00:00 | Stop: 2023-08-06

## 2023-07-24 MED ADMIN — dilTIAZem (CARDIZEM CD) 24 hr capsule 120 mg: 120 mg | ORAL | @ 14:00:00

## 2023-07-24 MED ADMIN — albuterol 2.5 mg /3 mL (0.083 %) nebulizer solution 2.5 mg: 2.5 mg | RESPIRATORY_TRACT | @ 17:00:00

## 2023-07-24 MED ADMIN — azithromycin (ZITHROMAX) tablet 250 mg: 250 mg | ORAL | @ 14:00:00 | Stop: 2023-08-06

## 2023-07-24 MED ADMIN — ascorbic acid (vitamin C) (VITAMIN C) tablet 1,000 mg: 1000 mg | ORAL | @ 14:00:00

## 2023-07-24 NOTE — Unmapped (Signed)
 Pt in with bronchiectasis.  VSS, no c/o pain, receiving scheduled pain meds, and IV antibiotics.  Skin and falls protocol maintained.  Will continue to work towards prioritized goals.    Problem: Adult Inpatient Plan of Care  Goal: Plan of Care Review  Outcome: Progressing     Problem: Adult Inpatient Plan of Care  Goal: Absence of Hospital-Acquired Illness or Injury  Intervention: Prevent Skin Injury  Recent Flowsheet Documentation  Taken 07/24/2023 0800 by Barclay Leyden, RN  Positioning for Skin: (able to turn self) --     Problem: Infection  Goal: Absence of Infection Signs and Symptoms  Outcome: Progressing

## 2023-07-24 NOTE — Unmapped (Signed)
 Medicine Daily Progress Note    Assessment/Plan:  Principal Problem:    Bronchiectasis  Active Problems:    Essential hypertension    Gastroesophageal reflux disease without esophagitis    Acute on chronic respiratory failure with hypoxemia    Sensory neuropathy           Jordan [name] is a 68 y.o. male who presented to [facility] with Bronchiectasis.    Bronchiectasis   Follows with Dr Sharion Davidson, seen in clinic on 07/23/23. Chronic pseudomonas infection. Worsened hypoxia (2L at baseline) and dyspnea with exertion. Requiring up to 6L.   - pulm consult  - IV ceftazidime , PO levaquin , inhaled colistin  - reassess on 5/5 if need for PICC placement  - Airway clearance with metanbeb/volera, hypertonic saline 10% nebs QID with albuterol  pre-treatment  - continue dornase and formulary equivalent of home inhalers.    - Continue montelukast  and chronic azithromycin .    - Prednisone  taper (40mg  x5d, taper by 10mg  q5d)   - Follow-up CMP, CBC, ESR, CRP.    - Goal SpO2 88-92%.  Wean as able.       Asthma with allergic component:    On dupixent  at home  - continue singulair  and inhalers here.    - discuss w pharmacy on 5/5 re Ohtuvayre    - send dupixent  to [facility]     Paroxysmal SVT:    - continue home diltiazem   - avoiding anticoagulation for him if any Afib     Chronic pain:  from lumbosacral radiculopathy - on chronic norco  at home which has been continued.       GERD:  continue PPI    Diet: Regular  DVT PPx: low risk, ambulatory  GI PPx: Not Indicated  Code Status: Full  Dispo: continue floor status    5/3: I personally spent 50 minutes face-to-face and non-face-to-face in the care of this patient, which includes all pre, intra, and post visit time on the date of service. All documented time was specific to the E/M visit and does not include any procedures that may have been performed.  ___________________________________________________________________    Subjective:  Very short winded today, unable to produce much sputum. Didn't sleep well due to cough.     Labs/Studies:  Labs and Studies from the last 24hrs per EMR and Reviewed    Objective:  Temp:  [36.9 ??C (98.5 ??F)-37.3 ??C (99.2 ??F)] 36.9 ??C (98.5 ??F)  Pulse:  [99-109] 106  Resp:  [18-19] 18  BP: (130-139)/(76-82) 139/82  SpO2:  [93 %-95 %] 95 %    GEN: sitting up in bedside chair, appears tired  HEENT: MMM, sclerae anicteric  CV: RRR s1 s2 no m/g/r, no peripheral edema  LUNGS: lungs with crackles in left lung base and rare whistle in RUL. Coarse otherwise. No accessory muscle use  ABD: soft, ND  NEURO: alert and interactive, face symmetric, MAEE

## 2023-07-24 NOTE — Unmapped (Signed)
 PULMONARY CONSULT  NOTE      Patient: Jordan [name]([id number])  Reason for consultation: Jordan [name] is a 68 y.o. male who is seen in consultation at the request of Jordan Charnley, MD for comprehensive evaluation of Bronchiectasis exacerbation.    Assessment and Recommendations:      Principal Problem:    Bronchiectasis  Active Problems:    Essential hypertension    Gastroesophageal reflux disease without esophagitis    Acute on chronic respiratory failure with hypoxemia    Sensory neuropathy    Bronchiectasis exacerbation  COPD/Asthma      Recommendations:    Antibiotics:  Based on prior culture data (OPF, PsA) and allergy profile, favor treating with:  IV ceftazidime   Oral levofloxacin   Inhaled colistin  May need single lumen PICC line for IV antibiotics based on clinical response assessed on 07/26/23. Duration to be determined based on clinical response and improvement in inflammatory markers.  Continue chronic azithromycin  for anti-inflammatory effect.     Other medications:  Prednisone  40 mg daily x 5 days. Taper by 10 mg every 5 days.  Convert Trelegy to formulary equivalent.  May change to Trelegy 200 after discharge.  COVID vaccine prior to discharge     Airway clearance: Needs aggressive airway clearance 4 times per day.  Nebulized hypertonic saline 10%; pre-treatment with albuterol .  For mechanical clearance, prefer metaneb and Vibralung.  Pulmozyme  2.5 mg nebulized daily     Consults:  Pulmonology     Labs:  On admission, CBC-D, CMP, CRP, ESR.  Sputum for bacterial and AFB cultures (NTM screening) sent from clinic.   CRP and ESR every 4-5 days to trend (including as outpatient).  [facility] Guideline for Monitoring Outpatient Intravenous and Oral Antimicrobial Therapy recommended monitoring labs ([name])     Imaging:  None     Other Testing:  Patient may need FVL performed while in the hospital depending on how he feels on Monday. If continuing IV antibiotics, would obtain prior to leaving hospital.     [facility]):  Patient is not a candidate for [name] due to distance.     Dispo:   Patient will need follow-up appointment with me in 2-3 months with FVL.  If he feels markedly better on Monday 07/26/23, I am okay with him discharging on prednisone  taper, levofloxacin , and colistin nebs.   Otherwise, would then ask for PICC placement for outpatient IV antibiotics.  Patient does not need to remain in the hospital for the entire course of IV antibiotics.   Please reach out to Jordan [name] Bronchiectasis/NTM Care and [name] team (myself, Jordan Randy RN, Jordan [name]) prior to discharge to ensure that home infusion orders are correct and that we are comfortable with patient discharging home on IVs at that time.  We will follow monitoring labs.  Please use DOTPULMIVDISCHARGE for home infusion orders. We will follow monitoring labs. Does not need OPAT.  Do not discharge Friday through Sunday or on a holiday as no outpatient clinical staff available to assist if problems arise with home health/home infusion.        We appreciate the opportunity to assist in the care of this patient.  Please page [phone] with any questions.    Jordan Holmes, MD    Subjective:      History of Present Illness:  [name] is a 68 y.o. male admitted for bronchiectasis exacerbation.    He was seen in clinic yesterday by Dr.  Sharion [name] and noted to have worsening hypoxemia on his baseline suppl O2.  Patient also relates increased cough but difficulty bringing up sputum.  Was sent to ED for admission and IV antibx.  Patient this am reports no clear improvement yet, continues to have trouble getting up sputum.    No hemoptysis.     Review of Systems: A comprehensive review of systems was performed and was negative except as above in HPI  Past Medical History:   Diagnosis Date    Abscess of lung 11/06/2010    CT Chest 10/22/10 12/05/2010 right thoracotomy resection of right middle lobe and resection of right lower lobe abscess     Arthritis     back    Biceps tendon tear 2013    left side    Bronchiectasis     chronic psuedomonas infection    Degenerative joint disease of left acromioclavicular joint     GERD (gastroesophageal reflux disease)     Hypertension     Obstructive sleep apnea on CPAP 03/14/13    AHI 33.5, on BiPAP 12/8 based on sleep study 09/2013    Pneumonia 2012    Lung abcess     PONV (postoperative nausea and vomiting)     Prostate cancer 09/09/2017    Rotator cuff injury left    Vertigo      Past [name] History:   Procedure Laterality Date    APPENDECTOMY  1969    BACK SURGERY      BRONCHOSCOPY  10/19/2010    [name]    CARPAL TUNNEL RELEASE  1999    IR EMBOLIZATION HEMORRHAGE ART OR VEN  LYMPHATIC EXTRAVASATION  07/11/2021    IR EMBOLIZATION HEMORRHAGE ART OR VEN  LYMPHATIC EXTRAVASATION 07/11/2021 Jordan Notch, MD IMG VIR H&V [name]    LUNG REMOVAL, PARTIAL Right 10/2010    RML and RLL partially resected    MENISCECTOMY  2011    NECK SURGERY  1993, 1999, 2002    PR COLSC FLX W/RMVL OF TUMOR POLYP LESION SNARE TQ Left 02/08/2023    Procedure: COLONOSCOPY FLEX; W/REMOV TUMOR/LES BY SNARE;  Surgeon: Jordan Bracken, MD;  Location: [name];  Service: Gastroenterology    PR GERD TST W/ MUCOS IMPEDE ELECTROD,>1HR N/A 07/06/2012    Procedure: ESOPHAGEAL FUNCTION TEST, GASTROESOPHAGEAL REFLUX TEST W/ NASAL CATHETER INTRALUMINAL IMPEDANCE ELECTRODE(S) PLACEMENT, RECORDING, ANALYSIS AND INTERPRETATION; PROLONGED;  Surgeon: None None;  Location: [name];  Service: Gastroenterology    PR OPEN TREAT RIB FRACTURE W/INT FIXATION, UNILATERAL, 1-2 RIBS Right 03/19/2015    Procedure: OPEN TREATMENT OF RIB FRACTURE REQUIRING INTERNAL FIXATION, UNILATERAL; 1-2 RIBS;  Surgeon: Jordan Schneider, MD;  [facility];  Service: Cardiothoracic    PR THORACOTOMY W/THERAP WEDGE RESEXN ADDL IPSILATRL Right 08/07/2013    Procedure: THORACOTOMY; WITH THERAPEUTIC LOBECTOMY OF RIGHT MIDDLE AND RIGHT LOWER LOBE RESECTION , EACH ADDITIONAL RESECTION, IPSILATERAL;  Surgeon: Vernetta Gong, MD;  [facility];  Service: Cardiothoracic    TONSILLECTOMY  1965     Medications reviewed in Epic  Allergies as of 07/23/2023 - Reviewed 07/23/2023   Allergen Reaction Noted    Gentamicin Other (See Comments) 07/28/2013    Tobramycin  Other (See Comments) 11/18/2012    Mirtazapine  Other (See Comments) 04/14/2022    Morphine Nausea And Vomiting 07/05/2012     Family History   Problem Relation Age of Onset    Alzheimer's disease Mother     Stroke  Mother     Bronchiectasis  Brother         presumed    Bronchiectasis  Brother         presumed    Liver disease Father     Diabetes Father     Heart failure Father     Bronchiectasis  Daughter     Asthma Son         childhood asthma    Clotting disorder Neg Hx     Anesthesia problems Neg Hx      Social History     Tobacco Use    Smoking status: Never    Smokeless tobacco: Never   Substance Use Topics    Alcohol  use: No     Alcohol /week: 0.0 standard drinks of alcohol         Objective:      Physical Exam:  Vitals:    07/23/23 1530 07/23/23 1730 07/23/23 2055 07/23/23 2209   BP: 130/76   139/82   Pulse: 109 99 100 106   Resp: 19 18 18 18    Temp: 37.3 ??C (99.2 ??F)   36.9 ??C (98.5 ??F)   TempSrc: Temporal   Oral   SpO2: 93% 93% 94% 95%   Weight: 80.7 kg (178 lb)      Height: 170.2 cm (5' 7)        General: Alert, well-appearing, and in no distress.  Eyes: Anicteric sclera, conjunctiva clear.  ENT:  Mucous membranes moist and intact.  Lungs: Coarse breath sounds bilaterally. Focal bibasilar crackles.  Cardiovascular: Regular rate and rhythm, S1, S2 normal, no murmur, click, rub or gallop appreciated.  Abdomen: Soft, non-tender  Musculoskeletal: No clubbing and no synovitis.  Skin: No rashes or lesions.  Neuro: No focal neurological deficits.              Diagnostic Review:   All labs and images were personally reviewed.

## 2023-07-24 NOTE — Unmapped (Signed)
 Pt admitted for bronchiectasis, is A & O x 4, ambulating independently to the bathroom. VS WN. Medication given as ordered and no signs of adverse effect. Will continue to monitor patient.  Problem: Adult Inpatient Plan of Care  Goal: Plan of Care Review  Outcome: Ongoing - Unchanged  Goal: Patient-Specific Goal (Individualized)  Outcome: Ongoing - Unchanged  Goal: Absence of Hospital-Acquired Illness or Injury  Outcome: Ongoing - Unchanged  Intervention: Identify and Manage Fall Risk  Recent Flowsheet Documentation  Taken 07/23/2023 2200 by Dolores Freud, RN  Safety Interventions: fall reduction program maintained  Taken 07/23/2023 2000 by Dolores Freud, RN  Safety Interventions: fall reduction program maintained  Intervention: Prevent Skin Injury  Recent Flowsheet Documentation  Taken 07/23/2023 2000 by Dolores Freud, RN  Positioning for Skin: Sitting in Chair  Goal: Optimal Comfort and Wellbeing  Outcome: Ongoing - Unchanged  Goal: Readiness for Transition of Care  Outcome: Ongoing - Unchanged  Goal: Rounds/Family Conference  Outcome: Ongoing - Unchanged

## 2023-07-25 MED ADMIN — colistimethate (COLYMYCIN) 150 mg in sodium chloride (NS) 4 mL inhalation: 150 mg | RESPIRATORY_TRACT

## 2023-07-25 MED ADMIN — cefTAZidime (FORTAZ) 2 g in sodium chloride 0.9 % (NS) 100 mL IVPB-MBP: 2 g | INTRAVENOUS | @ 19:00:00 | Stop: 2023-08-06

## 2023-07-25 MED ADMIN — cholecalciferol (vitamin D3 25 mcg (1,000 units)) tablet 50 mcg: 50 ug | ORAL | @ 14:00:00

## 2023-07-25 MED ADMIN — ferrous sulfate tablet 325 mg: 325 mg | ORAL | @ 14:00:00

## 2023-07-25 MED ADMIN — sodium chloride 10 % NEBULIZER solution 5 mL: 5 mL | RESPIRATORY_TRACT | @ 17:00:00

## 2023-07-25 MED ADMIN — predniSONE (DELTASONE) tablet 40 mg: 40 mg | ORAL | @ 14:00:00 | Stop: 2023-07-28

## 2023-07-25 MED ADMIN — albuterol 2.5 mg /3 mL (0.083 %) nebulizer solution 2.5 mg: 2.5 mg | RESPIRATORY_TRACT | @ 20:00:00

## 2023-07-25 MED ADMIN — multivitamins, therapeutic with minerals tablet 1 tablet: 1 | ORAL | @ 14:00:00

## 2023-07-25 MED ADMIN — guaiFENesin (ROBITUSSIN) oral syrup: 200 mg | ORAL | @ 02:00:00

## 2023-07-25 MED ADMIN — albuterol 2.5 mg /3 mL (0.083 %) nebulizer solution 2.5 mg: 2.5 mg | RESPIRATORY_TRACT | @ 17:00:00

## 2023-07-25 MED ADMIN — pantoprazole (Protonix) EC tablet 20 mg: 20 mg | ORAL | @ 14:00:00

## 2023-07-25 MED ADMIN — dornase alfa (PULMOZYME) 1 mg/mL solution 2.5 mg: 2.5 mg | RESPIRATORY_TRACT | @ 12:00:00

## 2023-07-25 MED ADMIN — levoFLOXacin (LEVAQUIN) tablet 750 mg: 750 mg | ORAL | @ 10:00:00 | Stop: 2023-08-06

## 2023-07-25 MED ADMIN — sodium chloride 10 % NEBULIZER solution 5 mL: 5 mL | RESPIRATORY_TRACT

## 2023-07-25 MED ADMIN — fluticasone furoate-vilanterol (BREO ELLIPTA) 100-25 mcg/dose inhaler 1 puff: 1 | RESPIRATORY_TRACT | @ 12:00:00

## 2023-07-25 MED ADMIN — albuterol 2.5 mg /3 mL (0.083 %) nebulizer solution 2.5 mg: 2.5 mg | RESPIRATORY_TRACT | @ 12:00:00

## 2023-07-25 MED ADMIN — cefTAZidime (FORTAZ) 2 g in sodium chloride 0.9 % (NS) 100 mL IVPB-MBP: 2 g | INTRAVENOUS | @ 10:00:00 | Stop: 2023-08-06

## 2023-07-25 MED ADMIN — sodium chloride 10 % NEBULIZER solution 5 mL: 5 mL | RESPIRATORY_TRACT | @ 12:00:00

## 2023-07-25 MED ADMIN — umeclidinium (INCRUSE ELLIPTA) 62.5 mcg/actuation inhaler 1 puff: 1 | RESPIRATORY_TRACT | @ 12:00:00

## 2023-07-25 MED ADMIN — sodium chloride 10 % NEBULIZER solution 5 mL: 5 mL | RESPIRATORY_TRACT | @ 20:00:00

## 2023-07-25 MED ADMIN — albuterol 2.5 mg /3 mL (0.083 %) nebulizer solution 2.5 mg: 2.5 mg | RESPIRATORY_TRACT

## 2023-07-25 MED ADMIN — colistimethate (COLYMYCIN) 150 mg in sodium chloride (NS) 4 mL inhalation: 150 mg | RESPIRATORY_TRACT | @ 12:00:00

## 2023-07-25 MED ADMIN — HYDROcodone-acetaminophen (HYCET) 7.5-325 mg/15 mL solution 7.5 mg of hydrocodone: 7.5 mg | ORAL | @ 16:00:00 | Stop: 2023-08-06

## 2023-07-25 MED ADMIN — HYDROcodone-acetaminophen (HYCET) 7.5-325 mg/15 mL solution 7.5 mg of hydrocodone: 7.5 mg | ORAL | @ 10:00:00 | Stop: 2023-08-06

## 2023-07-25 MED ADMIN — atorvastatin (LIPITOR) tablet 10 mg: 10 mg | ORAL

## 2023-07-25 MED ADMIN — ferrous sulfate tablet 325 mg: 325 mg | ORAL | @ 23:00:00

## 2023-07-25 MED ADMIN — guaiFENesin (ROBITUSSIN) oral syrup: 200 mg | ORAL | @ 10:00:00

## 2023-07-25 MED ADMIN — montelukast (SINGULAIR) tablet 10 mg: 10 mg | ORAL

## 2023-07-25 MED ADMIN — azithromycin (ZITHROMAX) tablet 250 mg: 250 mg | ORAL | @ 14:00:00 | Stop: 2023-08-06

## 2023-07-25 MED ADMIN — cefTAZidime (FORTAZ) 2 g in sodium chloride 0.9 % (NS) 100 mL IVPB-MBP: 2 g | INTRAVENOUS | @ 02:00:00 | Stop: 2023-08-06

## 2023-07-25 MED ADMIN — dilTIAZem (CARDIZEM CD) 24 hr capsule 120 mg: 120 mg | ORAL | @ 14:00:00

## 2023-07-25 MED ADMIN — HYDROcodone-acetaminophen (HYCET) 7.5-325 mg/15 mL solution 7.5 mg of hydrocodone: 7.5 mg | ORAL | @ 23:00:00 | Stop: 2023-08-06

## 2023-07-25 MED ADMIN — ascorbic acid (vitamin C) (VITAMIN C) tablet 1,000 mg: 1000 mg | ORAL | @ 14:00:00

## 2023-07-25 NOTE — Unmapped (Signed)
 Care Management  Initial Transition Planning Assessment              General  Care Manager assessed the patient by : Telephone conversation with patient, Medical record review, Discussion with Clinical Care team  Orientation Level: Oriented X4  Functional level prior to admission: Independent  Reason for referral: Discharge Planning, Home Health    Contact/Decision [facility]  Extended Emergency Contact Information  Primary Emergency Contact: [facility]  Address: [address]           [place], [place] [address] [place]  Home Phone: [phone]  Mobile Phone: [phone]  Relation: Spouse    Type of Residence: Mailing Address:  [address]  [place] [address]  Contacts:    Patient Phone Number: [phone] (cell)           Medical Provider(s): Donnis Galeazzi, MD  Reason for Admission: Admitting Diagnosis:  bronchiectasis  Past Medical History:   has a past medical history of Abscess of lung (11/06/2010), Arthritis, Biceps tendon tear (2013), Bronchiectasis, Degenerative joint disease of left acromioclavicular joint, GERD (gastroesophageal reflux disease), Hypertension, Obstructive sleep apnea on CPAP (03/14/13), Pneumonia (2012), PONV (postoperative nausea and vomiting), Prostate cancer (09/09/2017), Rotator cuff injury (left), and Vertigo.  Past Surgical History:   has a past surgical history that includes Back surgery; Tonsillectomy (1965); Appendectomy (1969); pr gerd tst w/ mucos impede electrod,>1hr (N/A, 07/06/2012); Lung removal, partial (Right, 10/2010); Bronchoscopy (10/19/2010); Neck surgery (1993, 1999, 2002); Carpal tunnel release (1999); Meniscectomy (2011); pr thoracotomy w/therap wedge resexn addl ipsilatrl (Right, 08/07/2013); pr open treat rib fracture w/int fixation, unilateral, 1-2 ribs (Right, 03/19/2015); IR Embolization - Hemorrhage Art Or Ven  Lymphatic Extravasation (07/11/2021); and pr colsc flx w/rmvl of tumor polyp lesion snare tq (Left, 02/08/2023). Previous admit date: 01/26/2023    Primary Insurance- Payor: Manufacturing engineer / Plan: HEALTH TEAM ADVANTAGE / Product Type: *No Product type* /   Secondary Insurance - None  Prescription Coverage -   Preferred Pharmacy - [id number] - [place], [place] - [address]    Transportation home: Heritage manager Next of Kin / Guardian / POA / Advance Directives     HCDM (HCPOA): Genie, Scheler - Spouse - [phone]    Advance Directive (Medical Treatment)  Does patient have an advance directive covering medical treatment?: Patient does not have advance directive covering medical treatment.  Advance directive covering medical treatment not in Chart:: Copy requested from family  Reason patient does not have an advance directive covering medical treatment:: Patient does not wish to complete one at this time.    Health Care Decision Maker [HCDM] (Medical & Mental Health Treatment)  Healthcare Decision Maker: HCDM documented in the HCDM/Contact Info section.  Information offered on HCDM, Medical & Mental Health advance directives:: Patient given information.    Advance Directive (Mental Health Treatment)  Does patient have an advance directive covering mental health treatment?: Patient does not have advance directive covering mental health treatment.  Reason patient does not have an advance directive covering mental health treatment:: HCDM documented in the HCDM/Contact Info section.    Readmission Information    Have you been hospitalized in the last 30 days?: No     Did the following happen with your discharge?     Patient Information  Lives with: Spouse/significant other  Type of Residence: Private residence   Location/Detail: [place]   Pt lives in 2 level home has  2 steps  Support Systems/Concerns: Spouse  Responsibilities/Dependents at home?: No  Home Care services in place prior to admission?: No   Outpatient/Community Resources in place prior to admission: Clinic  Agency detail (Name/Phone #): Dr Ruven Coy PCP  Equipment Currently Used at Home: other (see comments), oxygen (nebulizer)  Current HME Agency (Name/Phone #): Baseline O2 is 2 L provided by [employer]  Currently receiving outpatient dialysis?: No   Financial Information     Need for financial assistance?: No     Social Determinants of Health  Social Drivers of Health     Food Insecurity: No Food Insecurity (07/25/2023)    Hunger Vital Sign     Worried About Running Out of Food in the Last Year: Never true     Ran Out of Food in the Last Year: Never true   Tobacco Use: Low Risk  (07/23/2023)    Patient History     Smoking Tobacco Use: Never     Smokeless Tobacco Use: Never     Passive Exposure: Not on file   Transportation Needs: No Transportation Needs (07/25/2023)    PRAPARE - Transportation     Lack of Transportation (Medical): No     Lack of Transportation (Non-Medical): No   Alcohol  Use: Not At Risk (01/22/2021)    Received from [facility], [facility]    AUDIT-C     Frequency of Alcohol  Consumption: Never     Average Number of Drinks: Patient does not drink     Frequency of Binge Drinking: Never   Housing: Low Risk  (07/25/2023)    Housing     Within the past 12 months, have you ever stayed: outside, in a car, in a tent, in an overnight shelter, or temporarily in someone else's home (i.e. couch-surfing)?: No     Are you worried about losing your housing?: No   Physical Activity: Inactive (01/22/2021)    Received from [facility], [facility]    Exercise Vital Sign     Days of Exercise per Week: 0 days     Minutes of Exercise per Session: 0 min   Utilities: Low Risk  (11/09/2022)    Utilities     Within the past 12 months, have you been unable to get utilities (heat, electricity) when it was really needed?: No   Stress: No Stress Concern Present (01/22/2021)    Received from [facility], [facility] - Occupational Stress Questionnaire     Feeling of Stress : Not at all   Interpersonal Safety: Not At Risk (07/19/2023)    Interpersonal Safety     Unsafe Where You Currently Live: No     Physically Hurt by Anyone: No     Abused by Anyone: No   Substance Use: Not on file (01/25/2023)   Intimate Partner Violence: Unknown (07/19/2023)    Humiliation, Afraid, Rape, and Kick questionnaire     Fear of Current or Ex-Partner: No     Emotionally Abused: No     Physically Abused: No     Sexually Abused: Not on file   Social Connections: Unknown (08/01/2021)    [facility] from [facility], [facility]    Social Network     Social Network: Not on file   Financial Resource Strain: Low Risk  (07/25/2023)    Overall Financial Resource Strain (CARDIA)     Difficulty of Paying Living Expenses: Not hard at all   Health Literacy: Not on  file   Internet Connectivity: Not on file       Complex Discharge Information    Is patient identified as a difficult/complex discharge?: No  Interventions:     Discharge Needs Assessment  Concerns to be Addressed: basic needs, adjustment to diagnosis/illness, coping/stress, discharge planning  Clinical Risk Factors: > 65, Multiple Diagnoses (Chronic)  Barriers to taking medications: No  Prior overnight hospital stay or ED visit in last 90 days: No  Anticipated Changes Related to Illness: none  Equipment Needed After Discharge: none    Discharge Facility/Level of Care Needs: other (see comments) (Home)    Readmission  Risk of Unplanned Readmission Score: UNPLANNED READMISSION SCORE: 16.98%  Predictive Model Details          17% (Medium)  Factor Value    Calculated 07/25/2023 08:04 32% Number of active inpatient medication orders 42    [place] Risk of Unplanned Readmission Model 11% Number of hospitalizations in last year 2     11% ECG/EKG order present in last 6 months     9% Latest BUN high (27 mg/dL)     9% Encounter of ten days or longer in last year present     7% Imaging order present in last 6 months     6% Age 3     5% Diagnosis of deficiency anemia present 5% Active corticosteroid inpatient medication order present     3% Charlson Comorbidity Index 2     2% Current length of stay 1.704 days     1% Active ulcer inpatient medication order present      Readmitted Within the Last 30 Days? (No if blank)   Patient at risk for readmission?: Yes    Discharge Plan  Screen findings are: Discharge planning needs identified or anticipated (Comment).    Expected Discharge Date: TBD    Expected Transfer from Critical Care:  NA    Quality data for continuing care services shared with patient and/or representative?: Yes  Patient and/or family were provided with choice of facilities / services that are available and appropriate to meet post hospital care needs?: Yes   List choices in order highest to lowest preferred, if applicable. : [facility] per pt    Initial Assessment complete?: Yes

## 2023-07-25 NOTE — Unmapped (Addendum)
 Jordan [name] is a 68 y.o. man who is presenting to [facility] with a bronchiectasis exacerbation.  The patient has idiopathic bronchiectasis with chronic pseudomonas colonization, right middle lobe lobectomy and right lower lobe wedge resection with prior massive hemoptysis, chronic hypoxemia on 2 to 4  liters of O2 at home, HTN, chronic peripheral neuropathy/lumbosacral radiculopathy on chronic norco  at home, osteoporosis on yearly zoledronic  acid, here with management of worsening cough with sputum production, and shortness of breath.  He was admitted directly from his pulmonologist's office for a bronchiectasis exacerbation.  Upon arrival here, the patient was noted to be on 6 liters of [place].    Treatment for bronchiectasis ensued and included the following based off pulmonology recs:    Antibiotics:  Given chronic colonization with Pseudomonas, was started on ceftazidime  and levofloxacin .  Patient completed therapy with antibiotics on 08/06/2023.  He remained inpatient for antibiotics as he was unable to afford the co-pay associated with home infusions.  Antibiotics were given through a picc line.  PICC line was removed on the day of discharge.    Airway Clearance:  was maintained on mechanical airway clearance with chest vest and metaneb/volera.  Was also on 10% hypertonic saline nebs with albuterol  pretreatment.  Patient reported improvement in his symptoms. Oxygen requirements came back down to 2-4 liters which is his baseline.    Other medications:  was on Trelegy at home and was converted to breo ellipta  and incruse; was also on home dose of dornase.  Started him on prednisone  per pulmonology recs.  Was on 40 mg po daily x5 days and then tapered by 10 mg every 5 days until he finished his course.  Taper look longer at first because he subjectively didn't improve in terms of symptoms so was kept on 40 mg po daily for longer than 5 days but then was back on schedule for the taper.  Was sent home with a taper.  Also has a component of allergic asthma and is on dupixent  at home which he could resume upon discharge.  Was continued on his home prophylactic dose of azithromycin .    The patient has known osteoporosis and received zoledronic  acid yearly with the last dose being 02/2023.  Follows up with Endocrinology outpatient.    He has a history of HTN with paroxysmal SVT and follows with [facility].  Was continued on his home dose of diltiazem .    Chronic back pain from lumbosacral radiculopathy - he was managed with his home norco  dose and this was not an active issue during his hospitalization.    The patient did have pedal edema bilaterally which usually occurs whenever he takes prednisone .  Advised him to keep his extremities elevated and this will likely get better once steroids are further tapered and eventually discontinued.  He was not otherwise volume overloaded clinically.

## 2023-07-25 NOTE — Unmapped (Signed)
 Pt in with bronchiectasis.  VSS, receiving IV antibiotics, tolerating without difficulty.  Independent with adls.  Skin and falls protocol maintained.  Will continue to work towards prioritized goals  Problem: Adult Inpatient Plan of Care  Goal: Plan of Care Review  Outcome: Progressing     Problem: Infection  Goal: Absence of Infection Signs and Symptoms  Outcome: Progressing

## 2023-07-25 NOTE — Unmapped (Signed)
 Medicine Daily Progress Note    Assessment/Plan:  Principal Problem:    Bronchiectasis  Active Problems:    Essential hypertension    Gastroesophageal reflux disease without esophagitis    Acute on chronic respiratory failure with hypoxemia    Sensory neuropathy           Jordan [name] is a 68 y.o. male who presented to [facility] with Bronchiectasis.    Bronchiectasis   Follows with Dr Sharion Davidson, seen in clinic on 07/23/23. Chronic pseudomonas infection. Worsened hypoxia (2L at baseline) and dyspnea with exertion. Requiring up to 6L, down to 4L currently.   - pulm consult  - IV ceftazidime , PO levaquin , inhaled colistin  - reassess on 5/5 if need for PICC placement  - Airway clearance with metanbeb/volera, hypertonic saline 10% nebs QID with albuterol  pre-treatment  - continue dornase and formulary equivalent of home inhalers.    - Continue montelukast  and chronic azithromycin .    - Prednisone  taper (40mg  x5d, taper by 10mg  q5d)   - Follow-up CMP, CBC, ESR, CRP.    - Goal SpO2 88-92%.  Add continuous O2 monitoring. Wean as able.       Asthma with allergic component:    On dupixent  at home  - continue singulair  and inhalers here.    - discuss w pharmacy on 5/5 re Ohtuvayre    - send dupixent  to [facility] specialty pharmacy     Paroxysmal SVT:    - continue home diltiazem   - avoiding anticoagulation for him if any Afib     Chronic pain:  from lumbosacral radiculopathy - on chronic norco  at home which has been continued.       GERD:  continue PPI    Diet: Regular  DVT PPx: low risk, ambulatory  GI PPx: Not Indicated  Code Status: Full  Dispo: continue floor status    5/4: I personally spent 35 minutes face-to-face and non-face-to-face in the care of this patient, which includes all pre, intra, and post visit time on the date of service. All documented time was specific to the E/M visit and does not include any procedures that may have been performed.  ___________________________________________________________________    Subjective:  Feels about the same. Slighlty productive cough, but otherwise limited in terms of what he is producing.     Labs/Studies:  Labs and Studies from the last 24hrs per EMR and Reviewed    Objective:  Temp:  [36.9 ??C (98.5 ??F)-37.1 ??C (98.7 ??F)] 36.9 ??C (98.5 ??F)  Pulse:  [77-86] 77  Resp:  [18-20] 19  BP: (121-127)/(74) 121/74  SpO2:  [95 %-99 %] 97 %    GEN: sitting up in bedside chair, appears tired  HEENT: MMM, sclerae anicteric  CV: RRR s1 s2 no m/g/r, no peripheral edema  LUNGS: lungs with crackles in left lung base and RUL. Coarse otherwise. No accessory muscle use  ABD: soft, ND  NEURO: alert and interactive, face symmetric, MAEE

## 2023-07-25 NOTE — Unmapped (Signed)
 Patient admitted with bronchiectasis. Patient is alert and oriented ??4 and ambulating independently to the bathroom. Vital signs are within normal limits. No complaints of pain; receiving scheduled pain medications and IV antibiotics without signs of adverse effects. Skin integrity and fall precautions are in place and maintained. Will continue to monitor patient and support progress toward prioritized care goals.  Problem: Adult Inpatient Plan of Care  Goal: Plan of Care Review  Outcome: Ongoing - Unchanged  Goal: Patient-Specific Goal (Individualized)  Outcome: Ongoing - Unchanged  Goal: Absence of Hospital-Acquired Illness or Injury  Outcome: Ongoing - Unchanged  Intervention: Identify and Manage Fall Risk  Recent Flowsheet Documentation  Taken 07/24/2023 2200 by Dolores Freud, RN  Safety Interventions: fall reduction program maintained  Taken 07/24/2023 2000 by Dolores Freud, RN  Safety Interventions: fall reduction program maintained  Goal: Optimal Comfort and Wellbeing  Outcome: Ongoing - Unchanged  Goal: Readiness for Transition of Care  Outcome: Ongoing - Unchanged  Goal: Rounds/Family Conference  Outcome: Ongoing - Unchanged

## 2023-07-26 MED ADMIN — albuterol 2.5 mg /3 mL (0.083 %) nebulizer solution 2.5 mg: 2.5 mg | RESPIRATORY_TRACT | @ 17:00:00

## 2023-07-26 MED ADMIN — cefTAZidime (FORTAZ) 2 g in sodium chloride 0.9 % (NS) 100 mL IVPB-MBP: 2 g | INTRAVENOUS | @ 17:00:00 | Stop: 2023-08-06

## 2023-07-26 MED ADMIN — umeclidinium (INCRUSE ELLIPTA) 62.5 mcg/actuation inhaler 1 puff: 1 | RESPIRATORY_TRACT | @ 13:00:00

## 2023-07-26 MED ADMIN — sodium chloride 10 % NEBULIZER solution 5 mL: 5 mL | RESPIRATORY_TRACT | @ 20:00:00

## 2023-07-26 MED ADMIN — guaiFENesin (ROBITUSSIN) oral syrup: 200 mg | ORAL | @ 10:00:00

## 2023-07-26 MED ADMIN — azithromycin (ZITHROMAX) tablet 250 mg: 250 mg | ORAL | @ 14:00:00 | Stop: 2023-08-06

## 2023-07-26 MED ADMIN — cefTAZidime (FORTAZ) 2 g in sodium chloride 0.9 % (NS) 100 mL IVPB-MBP: 2 g | INTRAVENOUS | @ 09:00:00 | Stop: 2023-08-06

## 2023-07-26 MED ADMIN — dornase alfa (PULMOZYME) 1 mg/mL solution 2.5 mg: 2.5 mg | RESPIRATORY_TRACT | @ 13:00:00

## 2023-07-26 MED ADMIN — pantoprazole (Protonix) EC tablet 20 mg: 20 mg | ORAL | @ 13:00:00

## 2023-07-26 MED ADMIN — albuterol 2.5 mg /3 mL (0.083 %) nebulizer solution 2.5 mg: 2.5 mg | RESPIRATORY_TRACT | @ 20:00:00

## 2023-07-26 MED ADMIN — atorvastatin (LIPITOR) tablet 10 mg: 10 mg | ORAL | @ 01:00:00

## 2023-07-26 MED ADMIN — ferrous sulfate tablet 325 mg: 325 mg | ORAL | @ 21:00:00

## 2023-07-26 MED ADMIN — ascorbic acid (vitamin C) (VITAMIN C) tablet 1,000 mg: 1000 mg | ORAL | @ 13:00:00

## 2023-07-26 MED ADMIN — fluticasone furoate-vilanterol (BREO ELLIPTA) 100-25 mcg/dose inhaler 1 puff: 1 | RESPIRATORY_TRACT | @ 13:00:00

## 2023-07-26 MED ADMIN — HYDROcodone-acetaminophen (HYCET) 7.5-325 mg/15 mL solution 7.5 mg of hydrocodone: 7.5 mg | ORAL | @ 16:00:00 | Stop: 2023-08-06

## 2023-07-26 MED ADMIN — ferrous sulfate tablet 325 mg: 325 mg | ORAL | @ 13:00:00

## 2023-07-26 MED ADMIN — colistimethate (COLYMYCIN) 150 mg in sodium chloride (NS) 4 mL inhalation: 150 mg | RESPIRATORY_TRACT | @ 13:00:00

## 2023-07-26 MED ADMIN — multivitamins, therapeutic with minerals tablet 1 tablet: 1 | ORAL | @ 13:00:00

## 2023-07-26 MED ADMIN — cefTAZidime (FORTAZ) 2 g in sodium chloride 0.9 % (NS) 100 mL IVPB-MBP: 2 g | INTRAVENOUS | @ 01:00:00 | Stop: 2023-08-06

## 2023-07-26 MED ADMIN — dilTIAZem (CARDIZEM CD) 24 hr capsule 120 mg: 120 mg | ORAL | @ 13:00:00

## 2023-07-26 MED ADMIN — HYDROcodone-acetaminophen (HYCET) 7.5-325 mg/15 mL solution 7.5 mg of hydrocodone: 7.5 mg | ORAL | @ 09:00:00 | Stop: 2023-08-06

## 2023-07-26 MED ADMIN — montelukast (SINGULAIR) tablet 10 mg: 10 mg | ORAL | @ 01:00:00

## 2023-07-26 MED ADMIN — guaiFENesin (ROBITUSSIN) oral syrup: 200 mg | ORAL | @ 03:00:00

## 2023-07-26 MED ADMIN — HYDROcodone-acetaminophen (HYCET) 7.5-325 mg/15 mL solution 7.5 mg of hydrocodone: 7.5 mg | ORAL | @ 21:00:00 | Stop: 2023-08-06

## 2023-07-26 MED ADMIN — albuterol 2.5 mg /3 mL (0.083 %) nebulizer solution 2.5 mg: 2.5 mg | RESPIRATORY_TRACT | @ 13:00:00

## 2023-07-26 MED ADMIN — cholecalciferol (vitamin D3 25 mcg (1,000 units)) tablet 50 mcg: 50 ug | ORAL | @ 13:00:00

## 2023-07-26 MED ADMIN — predniSONE (DELTASONE) tablet 40 mg: 40 mg | ORAL | @ 13:00:00 | Stop: 2023-07-28

## 2023-07-26 MED ADMIN — sodium chloride 10 % NEBULIZER solution 5 mL: 5 mL | RESPIRATORY_TRACT | @ 13:00:00

## 2023-07-26 MED ADMIN — levoFLOXacin (LEVAQUIN) tablet 750 mg: 750 mg | ORAL | @ 09:00:00 | Stop: 2023-08-06

## 2023-07-26 MED ADMIN — sodium chloride 10 % NEBULIZER solution 5 mL: 5 mL | RESPIRATORY_TRACT | @ 17:00:00

## 2023-07-26 MED ADMIN — sodium chloride (NS) 0.9 % flush 10 mL: 10 mL | INTRAVENOUS | @ 16:00:00

## 2023-07-26 NOTE — Unmapped (Signed)
 Patient is alert and oriented x4. VSS on 4L O2. Continuous pulse ox monitoring continued this shift, and no calls up to this point. Pain controlled this shift with scheduled norco . Pt is continent of bowel and bladder and is ambulatory. Up to chair for part of day. Pt had PICC placed this shift. Tolerating IV antibiotics. Worked with PT this shift. Wife visiting at bedside throughout day. Universal fall precautions in place.     12hr chart check completed.       Problem: Adult Inpatient Plan of Care  Goal: Plan of Care Review  Outcome: Ongoing - Unchanged  Goal: Patient-Specific Goal (Individualized)  Outcome: Ongoing - Unchanged  Goal: Absence of Hospital-Acquired Illness or Injury  Outcome: Ongoing - Unchanged  Intervention: Identify and Manage Fall Risk  Recent Flowsheet Documentation  Taken 07/26/2023 1400 by Rogue Clear, RN  Safety Interventions: fall reduction program maintained  Taken 07/26/2023 1200 by Rogue Clear, RN  Safety Interventions:   fall reduction program maintained   family at bedside   lighting adjusted for tasks/safety   low bed  Taken 07/26/2023 1000 by Rogue Clear, RN  Safety Interventions:   fall reduction program maintained   lighting adjusted for tasks/safety   infection management   isolation precautions  Taken 07/26/2023 0800 by Rogue Clear, RN  Safety Interventions:   fall reduction program maintained   lighting adjusted for tasks/safety   low bed   infection management   isolation precautions  Intervention: Prevent Skin Injury  Recent Flowsheet Documentation  Taken 07/26/2023 1400 by Rogue Clear, RN  Positioning for Skin: Sitting in Chair  Taken 07/26/2023 1200 by Rogue Clear, RN  Positioning for Skin: Sitting in Chair  Taken 07/26/2023 1000 by Rogue Clear, RN  Positioning for Skin: Sitting in Chair  Taken 07/26/2023 0800 by Rogue Clear, RN  Positioning for Skin: Sitting in Chair  Intervention: Prevent Infection  Recent Flowsheet Documentation  Taken 07/26/2023 0800 by Rogue Clear, RN  Infection Prevention:   cohorting utilized   hand hygiene promoted   personal protective equipment utilized  Goal: Optimal Comfort and Wellbeing  Outcome: Ongoing - Unchanged  Goal: Readiness for Transition of Care  Outcome: Ongoing - Unchanged  Goal: Rounds/Family Conference  Outcome: Ongoing - Unchanged     Problem: Infection  Goal: Absence of Infection Signs and Symptoms  Outcome: Ongoing - Unchanged  Intervention: Prevent or Manage Infection  Recent Flowsheet Documentation  Taken 07/26/2023 1400 by Rogue Clear, RN  Isolation Precautions: contact precautions maintained  Taken 07/26/2023 1000 by Rogue Clear, RN  Isolation Precautions: contact precautions maintained  Taken 07/26/2023 0800 by Rogue Clear, RN  Infection Management: aseptic technique maintained  Isolation Precautions: contact precautions maintained     Problem: Airway Clearance Ineffective  Goal: Effective Airway Clearance  Outcome: Ongoing - Unchanged     Problem: Fall Injury Risk  Goal: Absence of Fall and Fall-Related Injury  Outcome: Ongoing - Unchanged  Intervention: Promote Injury-Free Environment  Recent Flowsheet Documentation  Taken 07/26/2023 1400 by Rogue Clear, RN  Safety Interventions: fall reduction program maintained  Taken 07/26/2023 1200 by Rogue Clear, RN  Safety Interventions:   fall reduction program maintained   family at bedside   lighting adjusted for tasks/safety   low bed  Taken 07/26/2023 1000 by Rogue Clear, RN  Safety Interventions:   fall reduction program maintained   lighting adjusted for tasks/safety   infection management   isolation precautions  Taken 07/26/2023 0800 by Rogue Clear, RN  Safety Interventions:  fall reduction program maintained   lighting adjusted for tasks/safety   low bed   infection management   isolation precautions

## 2023-07-26 NOTE — Unmapped (Signed)
 Patient admitted with bronchiectasis. Alert and oriented ??4, ambulating independently to the bathroom. Vital signs remain within normal limits. Denies pain; receiving scheduled pain management and IV antibiotics with no adverse effects observed. Patient continues to cough, and cough medication was administered with some relief. Skin integrity is intact, and fall precautions are in place and maintained. Will continue to monitor closely and support progress toward individualized care goals.  Problem: Adult Inpatient Plan of Care  Goal: Plan of Care Review  Outcome: Ongoing - Unchanged  Goal: Absence of Hospital-Acquired Illness or Injury  Intervention: Identify and Manage Fall Risk  Recent Flowsheet Documentation  Taken 07/25/2023 2200 by Dolores Freud, RN  Safety Interventions: fall reduction program maintained  Taken 07/25/2023 2000 by Dolores Freud, RN  Safety Interventions: fall reduction program maintained     Problem: Adult Inpatient Plan of Care  Goal: Absence of Hospital-Acquired Illness or Injury  Intervention: Identify and Manage Fall Risk  Recent Flowsheet Documentation  Taken 07/25/2023 2200 by Dolores Freud, RN  Safety Interventions: fall reduction program maintained  Taken 07/25/2023 2000 by Dolores Freud, RN  Safety Interventions: fall reduction program maintained

## 2023-07-26 NOTE — Unmapped (Signed)
 Adult Pulmonary Specialty Clinic Pharmacist Note     Ohtuvayre  start form was faxed to [facility]. Also included Medicare insurance card (likely to be billed through Part B based on prior discussions with [place]/nebs) and last clinic note from Dr. Sharion Davidson on 5/2. Received fax confirmation received.       Electronically signed:  Evalene Hilda, PharmD, BCACP, CPP  Clinical Pharmacist Practitioner  [facility] Adult Cystic Fibrosis/Pulmonary Clinic  [phone]    CC:   Hector Littles, MD  Leota Randy, RN ([facility]-Bronchiectasis Nurse Coordinator)

## 2023-07-26 NOTE — Unmapped (Signed)
 PICC LINE INSERTION PROCEDURE NOTE    Indications:  Antibiotic Therapy and Long Term Therapy    Consent/Time Out:    Risks, benefits and alternatives discussed with patient.  Written Consent was obtained prior to the procedure and is detailed in the medical record.  Prior to the start of the procedure, a time out was taken and the identity of the patient was confirmed via name, medical record number and  date of birth.  The availability of the correct equipment was verified.    Procedure Details:  The vein was identified and measured for appropriate catheter length. Maximum sterile techniques including PPE were utilized per protocol.  Sterile field prepared with necessary supplies and equipment. Insertion site was prepped with chlorhexidine solution and allowed to dry. Lidocaine  2 mL subcutaneously and intradermally administered to insertion site.  The catheter was primed with normal saline.  A 4 FR Single lumen was inserted to the R Basilic vein with 1 insertion attempt(s).  Catheter aspirated, 3 mL blood return present.  The catheter was then flushed with 10 mL of normal saline.   Insertion site cleansed, and sterile dressing applied per manufacturer guidelines. The Central Line Checklist was referenced.  The catheter was inserted without difficulty by [place] Temisha Murley, BS, BSN, [facility]-BC.    Findings:  Manufacturer:  [employer]  Lot #:  [id number]  CT Injectable (power):  Yes  Arm Circumference:  26.5  cm  Total catheter length:  40 cm.    External length:  0 cm.  Catheter trimmed:  yes  Port reserved:  No    Vein Size:   Right arm basilic vein compressible:  Yes, measurement:   (see image)    Tip Placement Verification:   [employer] and Sherlock    Workup Time:    60 minutes    Recommendations:  PICC brochure given to patient with teaching instructions  Refer to head of bed sign      See images below:

## 2023-07-26 NOTE — Unmapped (Signed)
 Medicine Daily Progress Note    Assessment/Plan:  Principal Problem:    Bronchiectasis  Active Problems:    Essential hypertension    Gastroesophageal reflux disease without esophagitis    Acute on chronic respiratory failure with hypoxemia    Sensory neuropathy           Jordan [name] is a 68 y.o. male who presented to [facility] with Bronchiectasis.    Bronchiectasis   Follows with Dr Sharion Davidson, seen in clinic on 07/23/23. Chronic pseudomonas infection. Worsened hypoxia (2L at baseline) and dyspnea with exertion. Requiring up to 6L, 4L currently.   - pulm consult  - IV ceftazidime , PO levaquin , inhaled colistin  - PICC ordered  - Airway clearance with metanbeb/volera, hypertonic saline 10% nebs QID with albuterol  pre-treatment  - continue dornase and formulary equivalent of home inhalers.    - Continue montelukast  and chronic azithromycin .    - Prednisone  taper (40mg  x5d, taper by 10mg  q5d)   - Follow-up CMP, CBC, ESR, CRP.    - Goal SpO2 88-92%.  continuous O2 monitoring. Wean as able.       Asthma with allergic component:    On dupixent  at home  - continue singulair  and inhalers here.    - discussed with Dr Ruta Cousins, pulm will coordinate for Ohtuvayre  and dupixent      Paroxysmal SVT:    - continue home diltiazem   - avoiding anticoagulation for him if any Afib     Chronic pain:  from lumbosacral radiculopathy - on chronic norco  at home which has been continued.       GERD:  continue PPI    Diet: Regular  DVT PPx: low risk, ambulatory  GI PPx: Not Indicated  Code Status: Full  Dispo: continue floor status    5/5: I personally spent 35 minutes face-to-face and non-face-to-face in the care of this patient, which includes all pre, intra, and post visit time on the date of service. All documented time was specific to the E/M visit and does not include any procedures that may have been performed.  ___________________________________________________________________    Subjective:  About the same today. Minimal production yet. Did have slightly more sound sleep overnight (~5 hours), although dry cough most of the morning.     Labs/Studies:  Labs and Studies from the last 24hrs per EMR and Reviewed    Objective:  Temp:  [36.7 ??C (98 ??F)] 36.7 ??C (98 ??F)  Pulse:  [76-88] 76  Resp:  [18] 18  BP: (132)/(75) 132/75  SpO2:  [93 %-99 %] 98 %    GEN: sitting up in bedside chair, appears tired  HEENT: MMM, sclerae anicteric  CV: RRR s1 s2 no m/g/r, no peripheral edema  LUNGS: lungs with crackles in left lung base and RUL. Slightly better air movement today. Coarse otherwise. No accessory muscle use  ABD: soft, ND  NEURO: alert and interactive, face symmetric, MAEE

## 2023-07-26 NOTE — Unmapped (Signed)
 Problem: Airway Clearance Ineffective  Goal: Effective Airway Clearance  Outcome: Progressing  Intervention: Promote Airway Secretion Clearance  Recent Flowsheet Documentation  Taken 07/25/2023 1936 by Marlene Simas, RRT  Breathing Techniques/Airway Clearance: deep/controlled cough encouraged   Received and remains on 4L [place], inhaled meds given without adverse reaction, metaneb performed for airway clearance, productive cough with small amount of small tan thick secretions expectorated, no complications or distress noted this shift

## 2023-07-26 NOTE — Unmapped (Signed)
 PHYSICAL THERAPY  Evaluation (07/26/23 1452)          Patient Name:  Jordan [name]       Medical Record Number: [id number]   Date of Birth: December 09, 1955  Sex: Male        Post-Discharge Physical Therapy Recommendations:  PT Post Acute Discharge Recommendations: Skilled PT services indicated, 3x weekly, Architectural technologist Recommendation  PT DME Recommendations: None          Treatment Diagnosis: Abnormalities of gait and mobility        Activity Tolerance: Tolerated treatment well     ASSESSMENT  Problem List: Decreased endurance, Pain, Gait deviation      Assessment : DYSEN [name] is a 68 y.o. male who is presenting to [facility] with Bronchiectasis, in the setting of the following pertinent/contributing co-morbidities: idiopathic bronchiectasis with chronic pseudomonas colonization, right middle lobe lobectomy and right lower lobe wedge resection chronic hypoxemia on 2 liters of O2 at home, HTN, chronic peripheral neuropathy/lumbosacral radiculopathy on chronic norco  at home, osteoporosis on yearly zoledronic  acid . pt presents to PT with endurance and SOB below baseline who will benefit from PT intervention to address for a safe transition home.    After review of contributing co-morbitities and personal factors, clinical presentation and exam findings, patient demonstrates low complexity for evaluation and development of POC.  Today's Interventions: pt ed re PT POC, pt instructed in diaphragmatic and purse lip breathing, ed on follow up need, mobility, ambulation/endurance, eval     Personal Factors/Comorbidities Present: 3+   Examination of Body System: Musculoskeletal, Cardiovascular, Pulmonary, Activity/participation  Clinical Presentation: Evolving    Clinical Decision Making: Low        PLAN  Planned Frequency of Treatment: Plan of Care Initiated: 07/26/23  1-2x per day Weekly Frequency: 2-3 days per week  Planned Treatment Duration: 08/08/23     Planned Interventions: Education (Patient/Family/Caregiver), Self-care / Home Management training, Therapeutic Exercise, Therapeutic Activity     Goals:   Patient and Family Goals: home     SHORT GOAL #1: pt will ambulate 300 ft mod I LRAD SpO2 >90% Borg <=4/10               Time Frame : 2 weeks                                                                    Prognosis:  Good  Positive Indicators: PLOF, participation  Barriers to Discharge: None     SUBJECTIVE  Communication Preference: Verbal     Patient reports: pt agreeable for PT  Pain Comments: pt has chonic lumbar radiating radiculopathy, did not rate  Medical Updates Since Last Visit/Relevant PMH Affecting Clinical Decision Making: pt admitted with bronchiectasis  Services patient receives prior to admission: PT  Prior Functional Status: pt is ambulatory without AD, can access community, 2 L [place] baseline, no falls  Living Situation  Living Environment: House  Lives With: Spouse  Home Living: Two level home, Stairs to enter without rails, Able to Live on main level with bedroom/bathroom, Stairs to alternate level with rails, Standard height toilet, Hand-held shower hose, Grab bars in shower, Walk-in shower  Number of Stairs to Enter (outside): 3  Rail placement (inside):  Bilateral rails in reach  Caregiver Identified?: Yes  Caregiver Availability: Intermittent  Caregiver Ability: Supervision  Caregiver Identified?: Yes   Equipment available at home: Straight cane, Rollator        Past Medical History:   Diagnosis Date    Abscess of lung 11/06/2010    CT Chest 10/22/10 12/05/2010 right thoracotomy resection of right middle lobe and resection of right lower lobe abscess     Arthritis     back    Biceps tendon tear 2013    left side    Bronchiectasis     chronic psuedomonas infection    Degenerative joint disease of left acromioclavicular joint     GERD (gastroesophageal reflux disease)     Hypertension     Obstructive sleep apnea on CPAP 03/14/13    AHI 33.5, on BiPAP 12/8 based on sleep study 09/2013    Pneumonia 2012    Lung abcess     PONV (postoperative nausea and vomiting)     Prostate cancer 09/09/2017    Rotator cuff injury left    Vertigo             Social History     Tobacco Use    Smoking status: Never    Smokeless tobacco: Never   Substance Use Topics    Alcohol  use: No     Alcohol /week: 0.0 standard drinks of alcohol        Past Surgical History:   Procedure Laterality Date    APPENDECTOMY  1969    BACK SURGERY      BRONCHOSCOPY  10/19/2010    [facility]    CARPAL TUNNEL RELEASE  1999    IR EMBOLIZATION HEMORRHAGE ART OR VEN  LYMPHATIC EXTRAVASATION  07/11/2021    IR EMBOLIZATION HEMORRHAGE ART OR VEN  LYMPHATIC EXTRAVASATION 07/11/2021 Judeth Notch, MD IMG VIR H&V [facility]    LUNG REMOVAL, PARTIAL Right 10/2010    RML and RLL partially resected    MENISCECTOMY  2011    NECK SURGERY  1993, 1999, 2002    PR COLSC FLX W/RMVL OF TUMOR POLYP LESION SNARE TQ Left 02/08/2023    Procedure: COLONOSCOPY FLEX; W/REMOV TUMOR/LES BY SNARE;  Surgeon: Percy Bracken, MD;  Location: [facility]URES [facility];  Service: Gastroenterology    PR GERD TST W/ MUCOS IMPEDE ELECTROD,>1HR N/A 07/06/2012    Procedure: ESOPHAGEAL FUNCTION TEST, GASTROESOPHAGEAL REFLUX TEST W/ NASAL CATHETER INTRALUMINAL IMPEDANCE ELECTRODE(S) PLACEMENT, RECORDING, ANALYSIS AND INTERPRETATION; PROLONGED;  Surgeon: None None;  Location: GI [facility]URES [facility];  Service: Gastroenterology    PR OPEN TREAT RIB FRACTURE W/INT FIXATION, UNILATERAL, 1-2 RIBS Right 03/19/2015    Procedure: OPEN TREATMENT OF RIB FRACTURE REQUIRING INTERNAL FIXATION, UNILATERAL; 1-2 RIBS;  Surgeon: Magda Schneider, MD;  Location: MAIN OR [facility];  Service: Cardiothoracic    PR THORACOTOMY W/THERAP WEDGE RESEXN ADDL IPSILATRL Right 08/07/2013    Procedure: THORACOTOMY; WITH THERAPEUTIC LOBECTOMY OF RIGHT MIDDLE AND RIGHT LOWER LOBE RESECTION , EACH ADDITIONAL RESECTION, IPSILATERAL;  Surgeon: Vernetta Gong, MD;  Location: MAIN OR [facility];  Service: Cardiothoracic    TONSILLECTOMY  1965             Family History   Problem Relation Age of Onset    Alzheimer's disease Mother     Stroke Mother     Bronchiectasis  Brother         presumed    Bronchiectasis  Brother         presumed  Liver disease Father     Diabetes Father     Heart failure Father     Bronchiectasis  Daughter     Asthma Son         childhood asthma    Clotting disorder Neg Hx     Anesthesia problems Neg Hx         Allergies: Gentamicin, Tobramycin , Mirtazapine , and Morphine                  Objective Findings  Precautions / Restrictions  Precautions: Isolation precautions  Weight Bearing Status: Non-applicable  Required Braces or Orthoses: Non-applicable     Medical Tests / Procedures: reviewed in EPIC  Equipment / Environment: Vascular access (PIV, TLC, Port-a-cath, PICC), Supplemental oxygen     Vitals/Orthostatics : VSS per telemetry; SpO2 85-94% on 4 L [place], asymptomatic     Cognition: Follows 2-step commands  Visual/Perception: Within Functional Limits  Hearing: No deficit identified     Skin Inspection: Intact where visualized     Upper Extremities  UE Strength: Right WFL, Left WFL    Lower Extremities  LE Strength: Left WFL, Right Impaired/Limited  RLE Strength Impairment: Pain with movement    Face, Cervical and Trunk ROM  Trunk ROM: WFL     Sensation: Impaired    Static Sitting-Level of Assistance: Independent  Dynamic Sitting-Level of Assistance: Archivist Standing-Level of Assistance: Independent  Dynamic Standing - Level of Assistance: Independent      Bed Mobility comments: not addressed, pt OOB     Transfers: Sit to Stand  Sit to Stand assistance level: Independent      Gait Level of Assistance: Modified independent, requires aide device or extra time  Gait Assistive Device:  (none)  Gait Distance Ambulated (ft): 120 ft  Skilled Treatment Performed: moderately slow, slight RLE drag                  Endurance: pt presents with new higher oxygen requirement, Borg 6-7/10 with ambulation, subjectively feels more winded    Patient at end of session: All needs in reach, Friends/Family present, In chair, Lines intact, Notified Nurse    Physical Therapy Session Duration  PT Individual [mins]: 36          AM-PAC 5 click  Help currently need turning over In bed?: None - Modified Independent/Independent  Help currently needed sitting down/standing up from chair with arms? : None - Modified Independent/Independent  Help currently needed moving from supine to sitting on edge of bed?: None - Modified Independent/Independent  Help currently needed moving to and from bed from wheelchair?: None - Modified Independent/Independent  Help currently needed walking in a hospital room?: None - Modified Independent/Independent      Basic Mobility Score 5 click: 20    Score (in points): % of Functional Impairment, Limitation, Restriction  5: 100% impaired, limited, restricted  6-7: At least 80%, but less than 100% impaired, limited restricted  8-11: At least 60%, but less than 80% impaired, limited restricted  12-16: At least 40%, but less than 60% impaired, limited restricted  17-18: At least 20%, but less than 40% impaired, limited restricted  19: At least 1%, but less than 20% impaired, limited restricted  20: 0% impaired, limited restricted    'AM-PAC' forms are Copyright protected by [employer] of [facility]         I attest that I have reviewed the above information.  Signed: Ralynn San, PT  Filed 07/26/2023

## 2023-07-26 NOTE — Unmapped (Addendum)
 OCCUPATIONAL THERAPY  Evaluation (07/26/23 1447)    Patient Name:  Jordan [name]       Medical Record Number: [id number]     Date of Birth: 03-29-1955  Sex: Male      Post-Discharge Occupational Therapy Recommendations: Skilled OT services NOT indicated   OT DME Recommendations: None       OT Treatment Diagnosis: Limitation of activities due to disability 2/2 decreased endurance       Assessment: Jordan [name] is a 68 y.o. male who is presenting to [facility] with Bronchiectasis, in the setting of the following pertinent/contributing co-morbidities: idiopathic bronchiectasis with chronic pseudomonas colonization, right middle lobe lobectomy and right lower lobe wedge resection chronic hypoxemia on 2 liters of O2 at home, HTN, chronic peripheral neuropathy/lumbosacral radiculopathy on chronic norco  at home and osteoporosis on yearly zoledronic  acid.     At baseline, pt reports being Mod I for ADLs and completes functional mobility without an AD, self-incorporating pacing techniques in daily routine. This date, pt presents close to functional baseline, with decreased endurance impacting engagement in ADL routine. During session, pt able to complete functional transfers, functional mobility and standing grooming tasks at sink Independently. OT provided pt with education re: importance of sitting in recliner chair, with pt verbalizing understanding. Will keep pt on caseload to address decreased endurance and increased 02 requirements at this time, though no post-acute OT services recommended at this time.       Today's Interventions: OT evaluation, AM-PAC 24/24, education re: role of OT and POC, occupational interview, functional transfers, functional mobility, standing grooming tasks at sink, education re: energy conservation strategies and activity modifications, fall prevention    Problem List: Shortness of breath, Decreased endurance, Decreased mobility  Personal Factors/Comorbidities (Occupational Profile and History Review): Expanded (Moderate)  Assessment of Occupational Performance : Endurance, Fine or gross motor coordination, Strength, Mobility, Balance  Clinical Decision Making: Low Complexity    Activity Tolerance During Today's Session  Limited by fatigue, Tolerated treatment well    Plan  Planned Frequency of Treatment: Plan of Care Initiated: 07/26/23  1-2x per day Weekly Frequency: 2-3 days per week  Planned Treatment Duration: 08/02/23    Planned Interventions:  Education (Patient/Family/Caregiver), Therapeutic Activity, Home Exercise Program, Self-Care/Home Training, Therapeutic Exercise      GOALS:   Patient and Family Goals: I want to be more mobile    Short Term:   SHORT GOAL #1: Pt will complete full body dressing tasks, Mod I using adaptive equipment as needed   Time Frame : 1 week  SHORT GOAL #2: Pt will verbalize x3 energy conservation strategies to incorporate into daily routine   Time Frame : 1 week  SHORT GOAL #3: Pt will complete standing ADL/IADL task for 10+ minutes with Supervision, seated as needed   Time Frame : 1 week    Long Term Goal #1: Pt will maintain 24/24 AM-PAC score  Time Frame: 2 weeks    Prognosis:  Good  Positive Indicators:  PLOF, caregiver support, participatory  Barriers to Discharge: None    Subjective  Prior Functional Status At baseline, pt reports being Mod I with ADLs and completes functional mobility without an AD. Pt lives with his wife who completes the household IADLs and driving tasks. Has decreased endurance and wears 2L of 02 at baseline. He reports self-pacing daily activities.    Living Situation  Living Environment: House  Lives With: Spouse  Home Living: Two level home, Stairs to enter without rails, Able  to Live on main level with bedroom/bathroom, Stairs to alternate level with rails, Standard height toilet, Hand-held shower hose, Grab bars in shower, Walk-in shower, Built in shower bench  Number of Stairs to Enter (outside): 3  Rail placement (inside): Bilateral rails in reach  Caregiver Identified?: Yes (Wife)  Caregiver Availability: Intermittent  Caregiver Ability: Limited lifting  Caregiver Identified?: Yes  Equipment available at home: Straight cane, Rollator    Medical Tests / Procedures: Medical record reviewed in EPIC       Patient / Caregiver reports: Pt agreeable to OT evaluation for assessment of current ADL performance      Past Medical History:   Diagnosis Date    Abscess of lung 11/06/2010    CT Chest 10/22/10 12/05/2010 right thoracotomy resection of right middle lobe and resection of right lower lobe abscess     Arthritis     back    Biceps tendon tear 2013    left side    Bronchiectasis     chronic psuedomonas infection    Degenerative joint disease of left acromioclavicular joint     GERD (gastroesophageal reflux disease)     Hypertension     Obstructive sleep apnea on CPAP 03/14/13    AHI 33.5, on BiPAP 12/8 based on sleep study 09/2013    Pneumonia 2012    Lung abcess     PONV (postoperative nausea and vomiting)     Prostate cancer 09/09/2017    Rotator cuff injury left    Vertigo     Social History     Tobacco Use    Smoking status: Never    Smokeless tobacco: Never   Substance Use Topics    Alcohol  use: No     Alcohol /week: 0.0 standard drinks of alcohol       Past Surgical History:   Procedure Laterality Date    APPENDECTOMY  1969    BACK SURGERY      BRONCHOSCOPY  10/19/2010    [facility]    CARPAL TUNNEL RELEASE  1999    IR EMBOLIZATION HEMORRHAGE ART OR VEN  LYMPHATIC EXTRAVASATION  07/11/2021    IR EMBOLIZATION HEMORRHAGE ART OR VEN  LYMPHATIC EXTRAVASATION 07/11/2021 Judeth Notch, MD IMG VIR H&V [facility]    LUNG REMOVAL, PARTIAL Right 10/2010    RML and RLL partially resected    MENISCECTOMY  2011    NECK SURGERY  1993, 1999, 2002    PR COLSC FLX W/RMVL OF TUMOR POLYP LESION SNARE TQ Left 02/08/2023    Procedure: COLONOSCOPY FLEX; W/REMOV TUMOR/LES BY SNARE;  Surgeon: Percy Bracken, MD;  Location: GI PROCEDURES [facility] [place]; Service: Gastroenterology    PR GERD TST W/ MUCOS IMPEDE ELECTROD,>1HR N/A 07/06/2012    Procedure: ESOPHAGEAL FUNCTION TEST, GASTROESOPHAGEAL REFLUX TEST W/ NASAL CATHETER INTRALUMINAL IMPEDANCE ELECTRODE(S) PLACEMENT, RECORDING, ANALYSIS AND INTERPRETATION; PROLONGED;  Surgeon: None None;  Location: GI [facility]URES [facility];  Service: Gastroenterology    PR OPEN TREAT RIB FRACTURE W/INT FIXATION, UNILATERAL, 1-2 RIBS Right 03/19/2015    Procedure: OPEN TREATMENT OF RIB FRACTURE REQUIRING INTERNAL FIXATION, UNILATERAL; 1-2 RIBS;  Surgeon: Magda Schneider, MD;  Location: MAIN OR [facility];  Service: Cardiothoracic    PR THORACOTOMY W/THERAP WEDGE RESEXN ADDL IPSILATRL Right 08/07/2013    Procedure: THORACOTOMY; WITH THERAPEUTIC LOBECTOMY OF RIGHT MIDDLE AND RIGHT LOWER LOBE RESECTION , EACH ADDITIONAL RESECTION, IPSILATERAL;  Surgeon: Vernetta Gong, MD;  Location: MAIN OR [facility];  Service: Cardiothoracic    TONSILLECTOMY  [phone]  Family History   Problem Relation Age of Onset    Alzheimer's disease Mother     Stroke Mother     Bronchiectasis  Brother         presumed    Bronchiectasis  Brother         presumed    Liver disease Father     Diabetes Father     Heart failure Father     Bronchiectasis  Daughter     Asthma Son         childhood asthma    Clotting disorder Neg Hx     Anesthesia problems Neg Hx         Gentamicin, Tobramycin , Mirtazapine , and Morphine     Objective Findings  Precautions / Restrictions  Falls precautions, Isolation precautions  Contact    Weight Bearing  Non-applicable    Required Braces or Orthoses  Non-applicable    Communication Preference  Verbal       Pain  Pt denied pain    Equipment / Environment  Vascular access (PIV, TLC, Port-a-cath, PICC), Supplemental oxygen (4L)    Cognition   Orientation Level:  Oriented x 4   Arousal/Alertness:  Appropriate responses to stimuli   Attention Span:  Appears intact   Memory:  Appears intact   Following Commands:  Follows all commands and directions without difficulty   Safety Judgment:  Good awareness of safety precautions   Awareness of Errors and Problem Solving:  Able to problem solve independently    Vision / Hearing   Vision: No acute deficits identified     Hearing: No deficit identified         Hand Function:  Right Hand Function: Right hand grip strength, ROM and coordination WNL  Left Hand Function: Left hand grip strength, ROM and coordination WNL    Skin Inspection:  Skin Inspection: Intact where visualized    Face/Cervical ROM:  Face ROM: WFL  Cervical ROM: WFL    ROM / Strength:  UE ROM/Strength: Left WFL, Right WFL  LE ROM/Strength: Left WFL, Right WFL    Coordination:  Coordination: WFL    Balance:  Static Sitting-Level of Assistance: Independent  Dynamic Sitting-Level of Assistance: Archivist Standing-Level of Assistance: Independent  Dynamic Standing - Level of Assistance: Independent  Standing Balance comments: Pt maintained dynamic standing balance during grooming tasks at sink Independently    Functional Mobility  Transfers: Independent (Sit<>stand from EOB)  Bed Mobility: Independent  Ambulation: Functional mobility household distance bed<>bathroom = Independent without an AD    ADLs  ADLs: Modified Independent (Pt had completed bathroom return just prior to OT entering room; Pt able to stand at sink and wash hands Independently)      AM-PAC-Daily Activity  Lower Body Dressing assistance needs: None - Modified Independent/Independent  Bathing assistance needs: None - Modified Independent/Independent  Toileting assistance needs: None - Modified Independent/Independent  Upper Body Dressing assistance needs: None - Modified Independent/Independent  Personal Grooming assistance needs: None - Modified Independent/Independent  Eating Meals assistance needs: None - Modified Independent/Independent    Daily Activity Score: 24    Score (in points): % of Functional Impairment, Limitation, Restriction  6: 100% impaired, limited, restricted  7-8: At least 80%, but less than 100% impaired, limited restricted  9-13: At least 60%, but less than 80% impaired, limited restricted  14-19: At least 40%, but less than 60% impaired, limited restricted  20-22: At least 20%, but less than 40% impaired, limited  restricted  23: At least 1%, but less than 20% impaired, limited restricted  24: 0% impaired, limited restricted    Vitals / Orthostatics  Vitals/Orthostatics: VSS on 4L of 02    Patient at end of session: All needs in reach, In bed, Friends/Family present, Lines intact     Occupational Therapy Session Duration  OT Individual [mins]: 15       I attest that I have reviewed the above information.  Signed: Earlis Glimpse, OT  Filed 07/26/2023

## 2023-07-26 NOTE — Unmapped (Signed)
 General Pulmonary Team Follow Up Consult Note     Date of Service: 07/26/2023  Requesting Physician: Rickford Charnley, MD   Requesting Service: Med Undesignated (MDX)  Reason for consultation: Comprehensive evaluation of bronchiectasis.    Hospital Problems:  Principal Problem:    Bronchiectasis  Active Problems:    Essential hypertension    Gastroesophageal reflux disease without esophagitis    Acute on chronic respiratory failure with hypoxemia    Sensory neuropathy      HPI: Jordan [name] is a 68 y.o. male with bronchiectasis, asthma/COPD overlap who presents for bronchiectasis exacerbation.    Problems addressed during this consult include bronchiectasis. Based on these problems, the patient has low risk of morbidity/mortality which is commensurate w their risk of management options described below in the recommendations.    Assessment      Interval Events: Cough is a little better, but having difficulty expectorating sputum. Still feeling more short of breath than his baseline.       Impression: The patient has made no clinical progress since last encounter. I personally reviewed most recent pertinent labs, imaging and micro data, from 07/26/2023, which are noted in recommendations..        Recommendations     Antibiotics:  Based on prior culture data (OPF, PsA) and allergy profile, favor treating with:  IV ceftazidime   Oral levofloxacin   Inhaled colistin  May need single lumen PICC line for IV antibiotics based on clinical response assessed on 07/26/23. Duration to be determined based on clinical response and improvement in inflammatory markers.  Continue chronic azithromycin  for anti-inflammatory effect.     Other medications:  Prednisone  40 mg daily x 5 days. Taper by 10 mg every 5 days.  Convert Trelegy to formulary equivalent.  May change to Trelegy 200 after discharge.  COVID vaccine prior to discharge     Airway clearance: Needs aggressive airway clearance 4 times per day.  Nebulized hypertonic saline 10%; pre-treatment with albuterol .  For mechanical clearance, prefer metaneb and Vibralung.  Pulmozyme  2.5 mg nebulized daily     Consults:  Pulmonology     Labs:  On admission, CBC-D, CMP, CRP, ESR.  Sputum for bacterial and AFB cultures (NTM screening) sent from clinic.   CRP and ESR every 4-5 days to trend (including as outpatient).  [facility] Guideline for Monitoring Outpatient Intravenous and Oral Antimicrobial Therapy recommended monitoring labs ([email or web address])     Imaging:  None     Other Testing:  Patient may need FVL performed while in the hospital depending on how he feels on Monday. If continuing IV antibiotics, would obtain prior to leaving hospital.     [facility]):  Patient is not a candidate for [facility] due to distance.     Dispo:   Patient will need follow-up appointment with me in 2-3 months with FVL.  Plan for PICC placement for outpatient IV antibiotics.  Patient does not need to remain in the hospital for the entire course of IV antibiotics.   Please reach out to [place] Bronchiectasis/NTM Care and [facility] team (myself, Leota Randy RN, Ballard Levels CPP) prior to discharge to ensure that home infusion orders are correct and that we are comfortable with patient discharging home on IVs at that time.  We will follow monitoring labs.  Please use DOTPULMIVDISCHARGE for home infusion orders. We will follow monitoring labs. Does not need OPAT.  Do not discharge Friday through Sunday or on a holiday as no outpatient  clinical staff available to assist if problems arise with home health/home infusion.     The recommendations outlined in this note were discussed w the primary team direct (in-person). Please do not hesitate to page [phone] [facility] consult) with questions. We appreciate the opportunity to assist in the care of this patient. We look forward to following with you.    Tonna Frederic, MD    Subjective & Objective     Vitals - past 24 hours  Temp:  [36.7 ??C (98 ??F)-36.8 ??C (98.2 ??F)] 36.8 ??C (98.2 ??F)  Pulse:  [76-102] 95  Resp:  [16-20] 16  BP: (125-132)/(66-75) 125/66  SpO2:  [93 %-99 %] 93 % Intake/Output  No intake/output data recorded.      Pertinent exam findings:   General appearance - well appearing, well nourished, and in no distress  Eyes - EOMI, PERRLA, anicteric sclerae, pink conjunctiva  Mouth - moist mucous membranes, no pharyngeal erythema or exudates  Neck - Trachea midline, normal neck movement  Lymphatics - not examined  Heart - regular rate and rhythm, normal S1/S2, no gallops, rubs, or murmurs  Chest - rhonchi noted scattered throughout both lung fields  Abdomen - soft, nontender, nondistended, no masses or organomegaly  Extremities - No lower extremity edema, no clubbing or cyanosis  Skin - normal coloration and turgor, no rashes, no suspicious skin lesions noted  Neurological - alert, oriented, normal speech, no focal findings or movement disorder noted    Relevant Imaging:  - CXR on 07/23/23 revealed Right apical pleural thickening with scarring and volume loss in the right lung apex. Elevated right hemidiaphragm. Extensive bronchiectasis and bronchial wall thickening bilaterally.     Arterial Blood Gas:   No results for input(s): SPECTYPEART, PHART, PCO2ART, PO2ART, HCO3ART, BEART, O2SATART in the last 24 hours.     Venous Blood Gas:   No results for input(s): PHVEN, PCO2VEN, PO2VEN, HCO3VEN, BEVEN, O2SATVEN in the last 24 hours.     Cultures:  Lower Respiratory Culture (no units)   Date Value   07/23/2023 2+ Mucoid Pseudomonas aeruginosa (A)   07/23/2023 Remainder of Culture in Progress (A)     WBC (10*9/L)   Date Value   07/23/2023 8.3          Other Labs:  Lab Results   Component Value Date    WBC 8.3 07/23/2023    HGB 13.8 07/23/2023    HCT 40.4 07/23/2023    PLT 200 07/23/2023     Lab Results   Component Value Date    NA 142 07/23/2023    K 4.1 07/23/2023    CL 102 07/23/2023    CO2 25.4 07/23/2023    BUN 27 (H) 07/23/2023    CREATININE 1.07 07/23/2023    GLU 114 07/23/2023    CALCIUM  10.0 07/23/2023    MG 1.5 (L) 12/22/2021    PHOS 3.7 01/22/2016     Lab Results   Component Value Date    BILITOT 0.2 (L) 07/23/2023    BILIDIR 0.40 04/02/2015    PROT 7.8 07/23/2023    ALBUMIN 3.7 07/23/2023    ALT 16 07/23/2023    AST 18 07/23/2023    ALKPHOS 96 07/23/2023    GGT 64 06/21/2013     Lab Results   Component Value Date    LABPROT 10.4 08/03/2013    INR 0.95 04/14/2017    APTT 32.6 07/25/2014       Allergies & Home Medications   Personally reviewed  in Epic    Continuous Infusions:       Scheduled Medications:    albuterol   2.5 mg Nebulization 4x Daily (RT)    ascorbic acid  (vitamin C )  1,000 mg Oral Daily    atorvastatin   10 mg Oral Daily    azithromycin   250 mg Oral Daily    cefTAZidime   2 g Intravenous Q8H SCH    cholecalciferol  (vitamin D3 25 mcg (1,000 units))  50 mcg Oral Daily    colistimethate  (COLYMYCIN) 150 mg in sodium chloride  (NS) 4 mL inhalation  150 mg Inhalation Q12H SCH    dilTIAZem   120 mg Oral Daily    dornase alfa   2.5 mg Inhalation Daily (RT)    ferrous sulfate   325 mg Oral BID with meals    fluticasone  furoate-vilanterol  1 puff Inhalation Daily (RT)    And    umeclidinium  1 puff Inhalation Daily (RT)    HYDROcodone -acetaminophen   7.5 mg of hydrocodone  Oral TID    levoFLOXacin   750 mg Oral daily    montelukast   10 mg Oral Nightly    multivitamins (ADULT)  1 tablet Oral Daily    pantoprazole   20 mg Oral Daily before breakfast    predniSONE   40 mg Oral Daily    Followed by    Cecily Cohen ON 07/28/2023] predniSONE   30 mg Oral Daily    Followed by    Cecily Cohen ON 08/02/2023] predniSONE   20 mg Oral Daily    Followed by    Cecily Cohen ON 08/07/2023] predniSONE   10 mg Oral Daily    sodium chloride   10 mL Intravenous Q8H    sodium chloride   5 mL Nebulization 4x Daily (RT)       PRN medications:  guaiFENesin , ondansetron  **OR** ondansetron 

## 2023-07-27 DIAGNOSIS — J4550 Severe persistent asthma, uncomplicated: Principal | ICD-10-CM

## 2023-07-27 MED ADMIN — guaiFENesin (ROBITUSSIN) oral syrup: 200 mg | ORAL | @ 02:00:00

## 2023-07-27 MED ADMIN — umeclidinium (INCRUSE ELLIPTA) 62.5 mcg/actuation inhaler 1 puff: 1 | RESPIRATORY_TRACT | @ 13:00:00

## 2023-07-27 MED ADMIN — sodium chloride (NS) 0.9 % flush 10 mL: 10 mL | INTRAVENOUS | @ 02:00:00

## 2023-07-27 MED ADMIN — cholecalciferol (vitamin D3 25 mcg (1,000 units)) tablet 50 mcg: 50 ug | ORAL | @ 14:00:00

## 2023-07-27 MED ADMIN — atorvastatin (LIPITOR) tablet 10 mg: 10 mg | ORAL | @ 02:00:00

## 2023-07-27 MED ADMIN — cefTAZidime (FORTAZ) 2 g in sodium chloride 0.9 % (NS) 100 mL IVPB-MBP: 2 g | INTRAVENOUS | @ 17:00:00 | Stop: 2023-08-06

## 2023-07-27 MED ADMIN — ferrous sulfate tablet 325 mg: 325 mg | ORAL | @ 22:00:00

## 2023-07-27 MED ADMIN — montelukast (SINGULAIR) tablet 10 mg: 10 mg | ORAL | @ 02:00:00

## 2023-07-27 MED ADMIN — cefTAZidime (FORTAZ) 2 g in sodium chloride 0.9 % (NS) 100 mL IVPB-MBP: 2 g | INTRAVENOUS | @ 10:00:00 | Stop: 2023-08-06

## 2023-07-27 MED ADMIN — HYDROcodone-acetaminophen (HYCET) 7.5-325 mg/15 mL solution 7.5 mg of hydrocodone: 7.5 mg | ORAL | @ 15:00:00 | Stop: 2023-08-06

## 2023-07-27 MED ADMIN — sodium chloride 10 % NEBULIZER solution 5 mL: 5 mL | RESPIRATORY_TRACT | @ 12:00:00

## 2023-07-27 MED ADMIN — colistimethate (COLYMYCIN) 150 mg in sodium chloride (NS) 4 mL inhalation: 150 mg | RESPIRATORY_TRACT | @ 13:00:00

## 2023-07-27 MED ADMIN — sodium chloride 10 % NEBULIZER solution 5 mL: 5 mL | RESPIRATORY_TRACT | @ 20:00:00

## 2023-07-27 MED ADMIN — colistimethate (COLYMYCIN) 150 mg in sodium chloride (NS) 4 mL inhalation: 150 mg | RESPIRATORY_TRACT | @ 01:00:00

## 2023-07-27 MED ADMIN — sodium chloride 10 % NEBULIZER solution 5 mL: 5 mL | RESPIRATORY_TRACT | @ 01:00:00

## 2023-07-27 MED ADMIN — albuterol 2.5 mg /3 mL (0.083 %) nebulizer solution 2.5 mg: 2.5 mg | RESPIRATORY_TRACT | @ 12:00:00

## 2023-07-27 MED ADMIN — ascorbic acid (vitamin C) (VITAMIN C) tablet 1,000 mg: 1000 mg | ORAL | @ 14:00:00

## 2023-07-27 MED ADMIN — sodium chloride (NS) 0.9 % flush 10 mL: 10 mL | INTRAVENOUS | @ 10:00:00

## 2023-07-27 MED ADMIN — sodium chloride 10 % NEBULIZER solution 5 mL: 5 mL | RESPIRATORY_TRACT | @ 16:00:00

## 2023-07-27 MED ADMIN — dornase alfa (PULMOZYME) 1 mg/mL solution 2.5 mg: 2.5 mg | RESPIRATORY_TRACT | @ 12:00:00

## 2023-07-27 MED ADMIN — HYDROcodone-acetaminophen (HYCET) 7.5-325 mg/15 mL solution 7.5 mg of hydrocodone: 7.5 mg | ORAL | @ 10:00:00 | Stop: 2023-08-06

## 2023-07-27 MED ADMIN — dilTIAZem (CARDIZEM CD) 24 hr capsule 120 mg: 120 mg | ORAL | @ 14:00:00

## 2023-07-27 MED ADMIN — fluticasone furoate-vilanterol (BREO ELLIPTA) 100-25 mcg/dose inhaler 1 puff: 1 | RESPIRATORY_TRACT | @ 13:00:00

## 2023-07-27 MED ADMIN — cefTAZidime (FORTAZ) 2 g in sodium chloride 0.9 % (NS) 100 mL IVPB-MBP: 2 g | INTRAVENOUS | @ 02:00:00 | Stop: 2023-08-06

## 2023-07-27 MED ADMIN — albuterol 2.5 mg /3 mL (0.083 %) nebulizer solution 2.5 mg: 2.5 mg | RESPIRATORY_TRACT | @ 16:00:00

## 2023-07-27 MED ADMIN — guaiFENesin (ROBITUSSIN) oral syrup: 200 mg | ORAL | @ 10:00:00

## 2023-07-27 MED ADMIN — HYDROcodone-acetaminophen (HYCET) 7.5-325 mg/15 mL solution 7.5 mg of hydrocodone: 7.5 mg | ORAL | @ 22:00:00 | Stop: 2023-08-06

## 2023-07-27 MED ADMIN — albuterol 2.5 mg /3 mL (0.083 %) nebulizer solution 2.5 mg: 2.5 mg | RESPIRATORY_TRACT | @ 01:00:00

## 2023-07-27 MED ADMIN — levoFLOXacin (LEVAQUIN) tablet 750 mg: 750 mg | ORAL | @ 10:00:00 | Stop: 2023-08-06

## 2023-07-27 MED ADMIN — pantoprazole (Protonix) EC tablet 20 mg: 20 mg | ORAL | @ 14:00:00

## 2023-07-27 MED ADMIN — albuterol 2.5 mg /3 mL (0.083 %) nebulizer solution 2.5 mg: 2.5 mg | RESPIRATORY_TRACT | @ 20:00:00

## 2023-07-27 MED ADMIN — azithromycin (ZITHROMAX) tablet 250 mg: 250 mg | ORAL | @ 14:00:00 | Stop: 2023-08-06

## 2023-07-27 MED ADMIN — sodium chloride (NS) 0.9 % flush 10 mL: 10 mL | INTRAVENOUS | @ 17:00:00

## 2023-07-27 MED ADMIN — predniSONE (DELTASONE) tablet 40 mg: 40 mg | ORAL | @ 14:00:00 | Stop: 2023-07-27

## 2023-07-27 MED ADMIN — multivitamins, therapeutic with minerals tablet 1 tablet: 1 | ORAL | @ 14:00:00

## 2023-07-27 MED ADMIN — ferrous sulfate tablet 325 mg: 325 mg | ORAL | @ 14:00:00

## 2023-07-27 NOTE — Unmapped (Signed)
 Patient is alert and oriented x4. VSS on 4LO2. Continuous pulse ox continued this shift, and no calls from tele up to this point. Pt up to chair for most of day. PICC line is intact with brisk blood return. Tolerating IV antibiotics. Tolerating regular diet and taking pills whole. Wife visiting at bedside for part of day. Continent of bowel and bladder. Contact precautions maintained. Universal fall precautions in place.     12hr chart check completed.       Problem: Adult Inpatient Plan of Care  Goal: Plan of Care Review  Outcome: Progressing  Goal: Patient-Specific Goal (Individualized)  Outcome: Progressing  Goal: Absence of Hospital-Acquired Illness or Injury  Outcome: Progressing  Intervention: Identify and Manage Fall Risk  Recent Flowsheet Documentation  Taken 07/27/2023 1200 by Rogue Clear, RN  Safety Interventions:   fall reduction program maintained   lighting adjusted for tasks/safety   low bed  Taken 07/27/2023 1000 by Rogue Clear, RN  Safety Interventions: fall reduction program maintained  Taken 07/27/2023 0800 by Rogue Clear, RN  Safety Interventions:   fall reduction program maintained   lighting adjusted for tasks/safety   low bed   nonskid shoes/slippers when out of bed  Intervention: Prevent Skin Injury  Recent Flowsheet Documentation  Taken 07/27/2023 1200 by Rogue Clear, RN  Positioning for Skin: Sitting in Chair  Taken 07/27/2023 1000 by Rogue Clear, RN  Positioning for Skin: Sitting in Chair  Taken 07/27/2023 0800 by Rogue Clear, RN  Positioning for Skin: Sitting in Chair  Intervention: Prevent Infection  Recent Flowsheet Documentation  Taken 07/27/2023 0800 by Rogue Clear, RN  Infection Prevention:   cohorting utilized   personal protective equipment utilized   single patient room provided  Goal: Optimal Comfort and Wellbeing  Outcome: Progressing  Goal: Readiness for Transition of Care  Outcome: Progressing  Goal: Rounds/Family Conference  Outcome: Progressing     Problem: Infection  Goal: Absence of Infection Signs and Symptoms  Outcome: Progressing  Intervention: Prevent or Manage Infection  Recent Flowsheet Documentation  Taken 07/27/2023 1200 by Rogue Clear, RN  Isolation Precautions: contact precautions maintained  Taken 07/27/2023 1000 by Rogue Clear, RN  Isolation Precautions: contact precautions maintained  Taken 07/27/2023 0800 by Rogue Clear, RN  Infection Management: aseptic technique maintained  Isolation Precautions: contact precautions maintained     Problem: Airway Clearance Ineffective  Goal: Effective Airway Clearance  Outcome: Progressing     Problem: Fall Injury Risk  Goal: Absence of Fall and Fall-Related Injury  Outcome: Progressing  Intervention: Promote Injury-Free Environment  Recent Flowsheet Documentation  Taken 07/27/2023 1200 by Rogue Clear, RN  Safety Interventions:   fall reduction program maintained   lighting adjusted for tasks/safety   low bed  Taken 07/27/2023 1000 by Rogue Clear, RN  Safety Interventions: fall reduction program maintained  Taken 07/27/2023 0800 by Rogue Clear, RN  Safety Interventions:   fall reduction program maintained   lighting adjusted for tasks/safety   low bed   nonskid shoes/slippers when out of bed

## 2023-07-27 NOTE — Unmapped (Signed)
 Pt alert and oriented x4. Vss. Pt remains on 4LNC. No complaints of pain overnight. Pt remains free from falls. IV abx infused per orders without difficulty. PICC line flushes and has adequate blood return. Dressing clean, dry and intact. Falls precautions in place, call bell within reach.   Problem: Adult Inpatient Plan of Care  Goal: Plan of Care Review  Outcome: Ongoing - Unchanged  Goal: Patient-Specific Goal (Individualized)  Outcome: Ongoing - Unchanged  Goal: Absence of Hospital-Acquired Illness or Injury  Outcome: Ongoing - Unchanged  Intervention: Identify and Manage Fall Risk  Recent Flowsheet Documentation  Taken 07/26/2023 2000 by Buford Carnes, RN  Safety Interventions:   fall reduction program maintained   low bed   nonskid shoes/slippers when out of bed  Intervention: Prevent Skin Injury  Recent Flowsheet Documentation  Taken 07/26/2023 2000 by Buford Carnes, RN  Positioning for Skin: Sitting in Chair  Goal: Optimal Comfort and Wellbeing  Outcome: Ongoing - Unchanged  Goal: Readiness for Transition of Care  Outcome: Ongoing - Unchanged  Goal: Rounds/Family Conference  Outcome: Ongoing - Unchanged     Problem: Infection  Goal: Absence of Infection Signs and Symptoms  Outcome: Ongoing - Unchanged  Intervention: Prevent or Manage Infection  Recent Flowsheet Documentation  Taken 07/26/2023 2000 by Buford Carnes, RN  Isolation Precautions: contact precautions maintained     Problem: Airway Clearance Ineffective  Goal: Effective Airway Clearance  Outcome: Ongoing - Unchanged     Problem: Fall Injury Risk  Goal: Absence of Fall and Fall-Related Injury  Outcome: Ongoing - Unchanged  Intervention: Promote Injury-Free Environment  Recent Flowsheet Documentation  Taken 07/26/2023 2000 by Buford Carnes, RN  Safety Interventions:   fall reduction program maintained   low bed   nonskid shoes/slippers when out of bed

## 2023-07-27 NOTE — Unmapped (Signed)
 [place] Hospitalist Daily Progress Note     LOS: 4 days     Assessment/Plan:  Principal Problem:    Bronchiectasis  Active Problems:    Essential hypertension    Gastroesophageal reflux disease without esophagitis    Acute on chronic respiratory failure with hypoxemia    Sensory neuropathy         Jordan [name] is a 68 y.o. male who presented to [facility] with Bronchiectasis.     Bronchiectasis   Follows with Dr Sharion Davidson, seen in clinic on 07/23/23. Chronic Pseudomonas infection. Worsened hypoxia (2L at baseline) and dyspnea with exertion. Requiring up to 6L, 4L currently. Sputum culture growing mucoid Pseudomonas.   - Pulm consult  - IV ceftazidime , PO levaquin , inhaled colistin  - Airway clearance with metanbeb/volera, hypertonic saline 10% nebs QID with albuterol  pre-treatment  - continue dornase and formulary equivalent of home inhalers.    - Continue montelukast  and chronic azithromycin .    - Prednisone  taper (40mg  x5d, taper by 10mg  q5d)   - Follow-up CMP, CBC, ESR, CRP.    - Goal SpO2 88-92%.  continuous O2 monitoring. Wean as able.       Asthma with allergic component:    On dupixent  at home  - continue singulair  and inhalers.    - Pulmonary Medicine will coordinate for [place]re  and dupixent      Paroxysmal SVT:    HR acceptable this morning.   - Diltiazem   - Avoiding anticoagulation for him if any Afib     Chronic pain:  from lumbosacral radiculopathy - on chronic norco  at home which has been continued.      DVT prophylaxis. Ambulatory      Disposition: Floor.  Possible [facility]     Please page the [facility] pager at [phone] with questions.      Pending labs:       Subjective:   Successful PICC placement yesterday. Notes less cough overall but does not really feel better yet, feels washed out. Not sleeping well yet. Does not increased light brown sputum production.      Objective:   Physical Exam:    GEN: NAD, sitting up to chair.    RESPIRATORY: Breathing pattern was normal and the chest moved symmetrically.  Lung sounds were diminished and there were no adventitious sounds.    CARDIOVASCULAR: Heart rate and rhythm were normal.  S1 and S2 were normal and there were no extra sounds or murmurs.  ABDOMEN: The abdomen was normal in contour.  Bowel sounds were present. Palpation detected no tenderness.   GENITOURINARY - foley absent.    NEUROLOGIC: Mental status was normal.  The patient was normally coordinated in the arms.   PSYCHIATRIC: The patient was oriented to person, place, time, and circumstance. Speech was normal. Mood and affect were normal    Vital signs in last 24 hours:  Temp:  [36.6 ??C (97.9 ??F)-36.8 ??C (98.2 ??F)] 36.6 ??C (97.9 ??F)  Pulse:  [84-102] 86  Resp:  [16-20] 18  BP: (125-131)/(66-71) 131/71  MAP (mmHg):  [81-88] 88  SpO2:  [93 %-98 %] 98 %    Intake/Output last 24 hours:    Intake/Output Summary (Last 24 hours) at 07/27/2023 0715  Last data filed at 07/26/2023 1126  Gross per 24 hour   Intake --   Output 3 ml   Net -3 ml       Medications:   Scheduled Meds:   albuterol   2.5 mg Nebulization 4x Daily (RT)  ascorbic acid  (vitamin C )  1,000 mg Oral Daily    atorvastatin   10 mg Oral Daily    azithromycin   250 mg Oral Daily    cefTAZidime   2 g Intravenous Q8H SCH    cholecalciferol  (vitamin D3 25 mcg (1,000 units))  50 mcg Oral Daily    colistimethate  (COLYMYCIN) 150 mg in sodium chloride  (NS) 4 mL inhalation  150 mg Inhalation Q12H [facility]    dilTIAZem   120 mg Oral Daily    dornase alfa   2.5 mg Inhalation Daily (RT)    ferrous sulfate   325 mg Oral BID with meals    fluticasone  furoate-vilanterol  1 puff Inhalation Daily (RT)    And    umeclidinium  1 puff Inhalation Daily (RT)    HYDROcodone -acetaminophen   7.5 mg of hydrocodone  Oral TID    levoFLOXacin   750 mg Oral daily    montelukast   10 mg Oral Nightly    multivitamins (ADULT)  1 tablet Oral Daily    pantoprazole   20 mg Oral Daily before breakfast    predniSONE   40 mg Oral Daily    Followed by    Cecily Cohen ON 07/28/2023] predniSONE   30 mg Oral Daily Followed by    Cecily Cohen ON 08/02/2023] predniSONE   20 mg Oral Daily    Followed by    Cecily Cohen ON 08/07/2023] predniSONE   10 mg Oral Daily    sodium chloride   10 mL Intravenous Q8H    sodium chloride   5 mL Nebulization 4x Daily (RT)     Continuous Infusions:    D Donis Furnish, MD MD  [facility].

## 2023-07-27 NOTE — Unmapped (Signed)
 General Pulmonary Team Follow Up Consult Note     Date of Service: 07/27/2023  Requesting Physician: Emogene Harpin, MD   Requesting Service: Med Undesignated (MDX)  Reason for consultation: Comprehensive evaluation of bronchiectasis.    Hospital Problems:  Principal Problem:    Bronchiectasis  Active Problems:    Essential hypertension    Gastroesophageal reflux disease without esophagitis    Acute on chronic respiratory failure with hypoxemia    Sensory neuropathy      HPI: Jordan [name] is a 68 y.o. male with bronchiectasis, asthma/COPD overlap who presents for bronchiectasis exacerbation.     Problems addressed during this consult include bronchiectasis. Based on these problems, the patient has moderate risk of morbidity/mortality which is commensurate w their risk of management options described below in the recommendations.    Assessment      Interval Events: Reports that his cough is a little less intense, with some improvement in sputum expectoration. Still feeling very dyspneic with walking from bed to bathroom.       Impression: The patient has made minimal clinical progress since last encounter. I personally reviewed most recent pertinent labs, imaging and micro data, from 07/27/2023, which are noted in recommendations..        Recommendations     Antibiotics:  Based on prior culture data (OPF, PsA) and allergy profile, favor treating with:  IV ceftazidime   Oral levofloxacin   Inhaled colistin  LRCX +pseudomonas  S/p PICC line placement for IV antibiotics  Continue chronic azithromycin  for anti-inflammatory effect.     Other medications:  Prednisone  40 mg daily x 5 days. Taper by 10 mg every 5 days.  Convert Trelegy to formulary equivalent.  May change to Trelegy 200 after discharge.  COVID vaccine prior to discharge     Airway clearance: Needs aggressive airway clearance 4 times per day.  Nebulized hypertonic saline 10%; pre-treatment with albuterol .  For mechanical clearance, prefer metaneb and Vibralung.  Pulmozyme  2.5 mg nebulized daily     Consults:  Pulmonology     Labs:  On admission, CBC-D, CMP, CRP, ESR.  Sputum for bacterial and AFB cultures (NTM screening) sent from clinic.   CRP and ESR every 4-5 days to trend (including as outpatient).  [facility] Guideline for Monitoring Outpatient Intravenous and Oral Antimicrobial Therapy recommended monitoring labs ([email or web address])     Imaging:  None     Other Testing:  Patient may need FVL performed while in the hospital depending on how he feels on Monday. If continuing IV antibiotics, would obtain prior to leaving hospital.     [facility]):  Patient is not a candidate for [facility] due to distance.     Dispo:   Patient will need follow-up appointment with me in 2-3 months with FVL.  Plan for PICC placement for outpatient IV antibiotics.  Patient does not need to remain in the hospital for the entire course of IV antibiotics.   Please reach out to [facility] Bronchiectasis/NTM Care and [facility] team (myself, Leota Randy RN, Ballard Levels CPP) prior to discharge to ensure that home infusion orders are correct and that we are comfortable with patient discharging home on IVs at that time.  We will follow monitoring labs.  Please use DOTPULMIVDISCHARGE for home infusion orders. We will follow monitoring labs. Does not need OPAT.  Do not discharge Friday through Sunday or on a holiday as no outpatient clinical staff available to assist if problems arise with home health/home infusion.  The recommendations outlined in this note were discussed w the primary team direct (in-person). Please do not hesitate to page [phone] [facility] consult) with questions. We appreciate the opportunity to assist in the care of this patient. We look forward to following with you.    Tonna Frederic, MD    Subjective & Objective     Vitals - past 24 hours  Temp:  [36.2 ??C (97.1 ??F)-36.6 ??C (97.9 ??F)] 36.2 ??C (97.1 ??F)  Pulse:  [82-86] 82  Resp:  [17-18] 18  BP: (119-131)/(63-82) 119/63  SpO2:  [94 %-98 %] 97 % Intake/Output  I/O last 3 completed shifts:  In: -   Out: 3 [Blood:3]      Pertinent exam findings:   General appearance - well appearing, well nourished, and in no distress  Eyes - EOMI, PERRLA, anicteric sclerae, pink conjunctiva  Mouth - not examined  Neck - Trachea midline, normal neck movement  Lymphatics - not examined  Heart - regular rate and rhythm, normal S1/S2, no gallops, rubs, or murmurs  Chest - rhonchi noted in the lower lobes bilaterally  Abdomen - soft, nontender, nondistended, no masses or organomegaly  Extremities - No lower extremity edema, no clubbing or cyanosis  Skin - normal coloration and turgor, no rashes, no suspicious skin lesions noted  Neurological - alert, oriented, normal speech, no focal findings or movement disorder noted    Relevant Imaging:  - CXR on 07/23/23 revealed Right apical pleural thickening with scarring and volume loss in the right lung apex. Elevated right hemidiaphragm. Extensive bronchiectasis and bronchial wall thickening bilaterally.     Arterial Blood Gas:   No results for input(s): SPECTYPEART, PHART, PCO2ART, PO2ART, HCO3ART, BEART, O2SATART in the last 24 hours.     Venous Blood Gas:   No results for input(s): PHVEN, PCO2VEN, PO2VEN, HCO3VEN, BEVEN, O2SATVEN in the last 24 hours.     Cultures:  Lower Respiratory Culture (no units)   Date Value   07/23/2023 4+ Mucoid Pseudomonas aeruginosa (A)   07/23/2023 3+ Oropharyngeal Flora Isolated     WBC (10*9/L)   Date Value   07/23/2023 8.3          Other Labs:  Lab Results   Component Value Date    WBC 8.3 07/23/2023    HGB 13.8 07/23/2023    HCT 40.4 07/23/2023    PLT 200 07/23/2023     Lab Results   Component Value Date    NA 142 07/23/2023    K 4.1 07/23/2023    CL 102 07/23/2023    CO2 25.4 07/23/2023    BUN 27 (H) 07/23/2023    CREATININE 1.07 07/23/2023    GLU 114 07/23/2023    CALCIUM  10.0 07/23/2023    MG 1.5 (L) 12/22/2021    PHOS 3.7 01/22/2016     Lab Results   Component Value Date    BILITOT 0.2 (L) 07/23/2023    BILIDIR 0.40 04/02/2015    PROT 7.8 07/23/2023    ALBUMIN 3.7 07/23/2023    ALT 16 07/23/2023    AST 18 07/23/2023    ALKPHOS 96 07/23/2023    GGT 64 06/21/2013     Lab Results   Component Value Date    LABPROT 10.4 08/03/2013    INR 0.95 04/14/2017    APTT 32.6 07/25/2014       Allergies & Home Medications   Personally reviewed in Epic    Continuous Infusions:       Scheduled Medications:  albuterol   2.5 mg Nebulization 4x Daily (RT)    ascorbic acid  (vitamin C )  1,000 mg Oral Daily    atorvastatin   10 mg Oral Daily    azithromycin   250 mg Oral Daily    cefTAZidime   2 g Intravenous Q8H SCH    cholecalciferol  (vitamin D3 25 mcg (1,000 units))  50 mcg Oral Daily    colistimethate  (COLYMYCIN) 150 mg in sodium chloride  (NS) 4 mL inhalation  150 mg Inhalation Q12H [facility]    dilTIAZem   120 mg Oral Daily    dornase alfa   2.5 mg Inhalation Daily (RT)    ferrous sulfate   325 mg Oral BID with meals    fluticasone  furoate-vilanterol  1 puff Inhalation Daily (RT)    And    umeclidinium  1 puff Inhalation Daily (RT)    HYDROcodone -acetaminophen   7.5 mg of hydrocodone  Oral TID    levoFLOXacin   750 mg Oral daily    montelukast   10 mg Oral Nightly    multivitamins (ADULT)  1 tablet Oral Daily    pantoprazole   20 mg Oral Daily before breakfast    [START ON 07/28/2023] predniSONE   30 mg Oral Daily    Followed by    Cecily Cohen ON 08/02/2023] predniSONE   20 mg Oral Daily    Followed by    Cecily Cohen ON 08/07/2023] predniSONE   10 mg Oral Daily    sodium chloride   10 mL Intravenous Q8H    sodium chloride   5 mL Nebulization 4x Daily (RT)       PRN medications:  guaiFENesin , ondansetron  **OR** ondansetron 

## 2023-07-27 NOTE — Unmapped (Incomplete)
 Primary Team: Please copy and paste below information into home infusion referral:     Disciplines requested: Nursing and Home IV Antibiotics    Physician to follow patient's care (the person listed here will be responsible for signing ongoing orders): Hector Littles, MD  Outpatient Pharmacist to follow IV antibiotic therapy: Bronchiectasis Patients: Jordan [name], CPP    Inpatient Start Date of Antibiotics (Day 1): 07/23/23  Start of Care Date (discharge date): ***  Estimate completion of therapy: ***  Home health/infusion please reach out to nurse contact (below) 2-3 days prior to end of therapy for appropriate orders  IV Access: PICC: Flush with 5 mLs of Normal Saline before and after each use and prn for line maintenance. Use after blood draws. Flush with Heparin  100units/mL 2 mLs at the end of each use and prn for line maintenance. Please change needleless cap weekly and after blood draws, or prn using aseptic technique. Please flush unused lumen(s) with Heparin  100units/mL 2 mLs every day.   Special Central line considerations:   (Allergies/Dressing preferences)      IV ANTIBIOTIC ORDERS:  {PULM IV ABX VHQI:69629}    **DO NOT DRAW DRUG LEVELS FROM PORT**    LAB MONITORING ORDERS:      Start labs on ***    {ABX LAB ORDERS-HOME BMWUX:324401}    Collect on MONDAYS of each week:  {OUTPATIENT LAB UUVOZD:66440}    Collect on THURSDAYS of each week (only for patients on aminoglycosides)  {OUTPATIENT LAB HKVQQV:95638}    TARGET DRUG LEVELS:  {TARGET DRUG LEVELS:101904}    --------------------------------------------------------------------------------------  *PLEASE FAX RESULTS TO: [phone]    *PHONE CONTACT*:  {PULM CLINIC CONTACT:92461}  For critical lab results or urgent matters outside of regular business hours (Mon through Fri 8:00AM-4:30PM, please call the hospital operator at [phone] and ask to page the pulmonary fellow on-call.    **Please warm hand-off to outpatient team nurse coordinator, pharmacist, and physician per contacts listed above**

## 2023-07-28 MED ADMIN — sodium chloride (NS) 0.9 % flush 10 mL: 10 mL | INTRAVENOUS | @ 16:00:00

## 2023-07-28 MED ADMIN — umeclidinium (INCRUSE ELLIPTA) 62.5 mcg/actuation inhaler 1 puff: 1 | RESPIRATORY_TRACT | @ 13:00:00

## 2023-07-28 MED ADMIN — colistimethate (COLYMYCIN) 150 mg in sodium chloride (NS) 4 mL inhalation: 150 mg | RESPIRATORY_TRACT | @ 01:00:00

## 2023-07-28 MED ADMIN — ferrous sulfate tablet 325 mg: 325 mg | ORAL | @ 23:00:00

## 2023-07-28 MED ADMIN — ascorbic acid (vitamin C) (VITAMIN C) tablet 1,000 mg: 1000 mg | ORAL | @ 13:00:00

## 2023-07-28 MED ADMIN — sodium chloride 10 % NEBULIZER solution 5 mL: 5 mL | RESPIRATORY_TRACT | @ 01:00:00

## 2023-07-28 MED ADMIN — dornase alfa (PULMOZYME) 1 mg/mL solution 2.5 mg: 2.5 mg | RESPIRATORY_TRACT | @ 12:00:00

## 2023-07-28 MED ADMIN — sodium chloride 10 % NEBULIZER solution 5 mL: 5 mL | RESPIRATORY_TRACT | @ 12:00:00

## 2023-07-28 MED ADMIN — montelukast (SINGULAIR) tablet 10 mg: 10 mg | ORAL | @ 01:00:00

## 2023-07-28 MED ADMIN — fluticasone furoate-vilanterol (BREO ELLIPTA) 100-25 mcg/dose inhaler 1 puff: 1 | RESPIRATORY_TRACT | @ 13:00:00

## 2023-07-28 MED ADMIN — pantoprazole (Protonix) EC tablet 20 mg: 20 mg | ORAL | @ 13:00:00

## 2023-07-28 MED ADMIN — HYDROcodone-acetaminophen (HYCET) 7.5-325 mg/15 mL solution 7.5 mg of hydrocodone: 7.5 mg | ORAL | @ 16:00:00 | Stop: 2023-08-06

## 2023-07-28 MED ADMIN — levoFLOXacin (LEVAQUIN) tablet 750 mg: 750 mg | ORAL | @ 10:00:00 | Stop: 2023-08-06

## 2023-07-28 MED ADMIN — albuterol 2.5 mg /3 mL (0.083 %) nebulizer solution 2.5 mg: 2.5 mg | RESPIRATORY_TRACT | @ 12:00:00

## 2023-07-28 MED ADMIN — cefTAZidime (FORTAZ) 2 g in sodium chloride 0.9 % (NS) 100 mL IVPB-MBP: 2 g | INTRAVENOUS | @ 18:00:00 | Stop: 2023-08-06

## 2023-07-28 MED ADMIN — HYDROcodone-acetaminophen (HYCET) 7.5-325 mg/15 mL solution 7.5 mg of hydrocodone: 7.5 mg | ORAL | @ 10:00:00 | Stop: 2023-08-06

## 2023-07-28 MED ADMIN — sodium chloride (NS) 0.9 % flush 10 mL: 10 mL | INTRAVENOUS | @ 02:00:00

## 2023-07-28 MED ADMIN — cefTAZidime (FORTAZ) 2 g in sodium chloride 0.9 % (NS) 100 mL IVPB-MBP: 2 g | INTRAVENOUS | @ 01:00:00 | Stop: 2023-08-06

## 2023-07-28 MED ADMIN — sodium chloride (NS) 0.9 % flush 10 mL: 10 mL | INTRAVENOUS | @ 10:00:00

## 2023-07-28 MED ADMIN — albuterol 2.5 mg /3 mL (0.083 %) nebulizer solution 2.5 mg: 2.5 mg | RESPIRATORY_TRACT | @ 16:00:00

## 2023-07-28 MED ADMIN — azithromycin (ZITHROMAX) tablet 250 mg: 250 mg | ORAL | @ 13:00:00 | Stop: 2023-08-06

## 2023-07-28 MED ADMIN — albuterol 2.5 mg /3 mL (0.083 %) nebulizer solution 2.5 mg: 2.5 mg | RESPIRATORY_TRACT

## 2023-07-28 MED ADMIN — multivitamins, therapeutic with minerals tablet 1 tablet: 1 | ORAL | @ 13:00:00

## 2023-07-28 MED ADMIN — dilTIAZem (CARDIZEM CD) 24 hr capsule 120 mg: 120 mg | ORAL | @ 13:00:00

## 2023-07-28 MED ADMIN — cholecalciferol (vitamin D3 25 mcg (1,000 units)) tablet 50 mcg: 50 ug | ORAL | @ 13:00:00

## 2023-07-28 MED ADMIN — albuterol 2.5 mg /3 mL (0.083 %) nebulizer solution 2.5 mg: 2.5 mg | RESPIRATORY_TRACT | @ 21:00:00

## 2023-07-28 MED ADMIN — ferrous sulfate tablet 325 mg: 325 mg | ORAL | @ 13:00:00

## 2023-07-28 MED ADMIN — colistimethate (COLYMYCIN) 150 mg in sodium chloride (NS) 4 mL inhalation: 150 mg | RESPIRATORY_TRACT | @ 13:00:00

## 2023-07-28 MED ADMIN — guaiFENesin (ROBITUSSIN) oral syrup: 200 mg | ORAL | @ 01:00:00

## 2023-07-28 MED ADMIN — atorvastatin (LIPITOR) tablet 10 mg: 10 mg | ORAL | @ 01:00:00

## 2023-07-28 MED ADMIN — sodium chloride 10 % NEBULIZER solution 5 mL: 5 mL | RESPIRATORY_TRACT | @ 16:00:00

## 2023-07-28 MED ADMIN — HYDROcodone-acetaminophen (HYCET) 7.5-325 mg/15 mL solution 7.5 mg of hydrocodone: 7.5 mg | ORAL | @ 23:00:00 | Stop: 2023-08-06

## 2023-07-28 MED ADMIN — sodium chloride 10 % NEBULIZER solution 5 mL: 5 mL | RESPIRATORY_TRACT | @ 21:00:00

## 2023-07-28 MED ADMIN — cefTAZidime (FORTAZ) 2 g in sodium chloride 0.9 % (NS) 100 mL IVPB-MBP: 2 g | INTRAVENOUS | @ 10:00:00 | Stop: 2023-08-06

## 2023-07-28 MED ADMIN — guaiFENesin (ROBITUSSIN) oral syrup: 200 mg | ORAL | @ 10:00:00

## 2023-07-28 MED ADMIN — predniSONE (DELTASONE) tablet 30 mg: 30 mg | ORAL | @ 13:00:00 | Stop: 2023-08-02

## 2023-07-28 NOTE — Unmapped (Signed)
 Pt alert and oriented x4. Vss. No complaints of pain overnight. Pt remains free from falls. Iv abx infused per orders without difficulty. Call bell within reach.   Problem: Adult Inpatient Plan of Care  Goal: Plan of Care Review  Outcome: Ongoing - Unchanged  Goal: Patient-Specific Goal (Individualized)  Outcome: Ongoing - Unchanged  Goal: Absence of Hospital-Acquired Illness or Injury  Outcome: Ongoing - Unchanged  Intervention: Identify and Manage Fall Risk  Recent Flowsheet Documentation  Taken 07/27/2023 2000 by Buford Carnes, RN  Safety Interventions:   fall reduction program maintained   low bed   nonskid shoes/slippers when out of bed  Intervention: Prevent Skin Injury  Recent Flowsheet Documentation  Taken 07/27/2023 2000 by Buford Carnes, RN  Positioning for Skin: Sitting in Chair  Goal: Optimal Comfort and Wellbeing  Outcome: Ongoing - Unchanged  Goal: Readiness for Transition of Care  Outcome: Ongoing - Unchanged  Goal: Rounds/Family Conference  Outcome: Ongoing - Unchanged     Problem: Infection  Goal: Absence of Infection Signs and Symptoms  Outcome: Ongoing - Unchanged  Intervention: Prevent or Manage Infection  Recent Flowsheet Documentation  Taken 07/27/2023 2200 by Buford Carnes, RN  Isolation Precautions: contact precautions maintained  Taken 07/27/2023 2000 by Buford Carnes, RN  Isolation Precautions: contact precautions maintained     Problem: Airway Clearance Ineffective  Goal: Effective Airway Clearance  Outcome: Ongoing - Unchanged     Problem: Fall Injury Risk  Goal: Absence of Fall and Fall-Related Injury  Outcome: Ongoing - Unchanged  Intervention: Promote Injury-Free Environment  Recent Flowsheet Documentation  Taken 07/27/2023 2000 by Buford Carnes, RN  Safety Interventions:   fall reduction program maintained   low bed   nonskid shoes/slippers when out of bed

## 2023-07-28 NOTE — Unmapped (Signed)
 [facility] Hospitalist Daily Progress Note     LOS: 5 days     Assessment/Plan:  Principal Problem:    Bronchiectasis  Active Problems:    Essential hypertension    Gastroesophageal reflux disease without esophagitis    Acute on chronic respiratory failure with hypoxemia    Sensory neuropathy         Jordan [name] is a 68 y.o. male who presented to [facility] with Bronchiectasis.     Bronchiectasis   Follows with Dr Sharion Davidson, seen in clinic on 07/23/23. Chronic Pseudomonas infection. Worsened hypoxia (2L at baseline) and dyspnea with exertion. Requiring up to 6L, remains on 4 L this morning. Sputum culture growing mucoid Pseudomonas. Going for PFTs this morning.   - Pulm consult  - IV ceftazidime , PO levaquin , inhaled colistin  - Airway clearance with metanbeb/volera, hypertonic saline 10% nebs QID with albuterol  pre-treatment  - continue dornase and formulary equivalent of home inhalers.    - Continue montelukast  and chronic azithromycin .    - Prednisone  taper (40mg  x5d, taper by 10mg  q5d)   - Follow-up CMP, CBC, ESR, CRP.    - Goal SpO2 88-92%.  continuous O2 monitoring. Wean as able.   - PFTs today       Asthma with allergic component:    On dupixent  at home  - continue singulair  and inhalers.    - Pulmonary Medicine will coordinate for Ohtuvayre  and dupixent      Paroxysmal SVT:    HR remains acceptable   - Diltiazem  120 QD  - Avoiding anticoagulation for him if any Afib     Chronic pain:  from lumbosacral radiculopathy - on chronic norco  at home which has been continued.      DVT prophylaxis. Ambulatory      Disposition: Floor.  Possible [facility]     Please page the [facility] pager at [phone] with questions.      Pending labs:       Subjective:   Cough is a bit better this morning. Still having some shortness of breath when getting up. He noticed on 4L that after ambulation he was hypoxic to the 80s, and had a headache.      Objective:   Physical Exam:    GEN: NAD, sitting up to chair.     RESPIRATORY: Breathing pattern was normal and the chest moved symmetrically.  Lung sounds were diminished and there were no adventitious sounds. Scattered left lower rhonchi.    CARDIOVASCULAR: Heart rate and rhythm were normal.  S1 and S2 were normal and there were no extra sounds or murmurs.  ABDOMEN: The abdomen was normal in contour.  Bowel sounds were present. Palpation detected no tenderness.   GENITOURINARY - foley absent.    NEUROLOGIC: Mental status was normal.  The patient was normally coordinated in the arms.   PSYCHIATRIC: The patient was oriented to person, place, time, and circumstance. Speech was normal. Mood and affect were normal    Vital signs in last 24 hours:  Temp:  [36.2 ??C (97.1 ??F)-36.7 ??C (98.1 ??F)] (P) 36.3 ??C (97.3 ??F)  Pulse:  [78-94] (P) 78  Resp:  [16-18] (P) 16  BP: (119-135)/(63-82) (P) 147/75  MAP (mmHg):  [81-97] 97  SpO2:  [93 %-99 %] (P) 97 %    Intake/Output last 24 hours:    Intake/Output Summary (Last 24 hours) at 07/28/2023 0741  Last data filed at 07/27/2023 2130  Gross per 24 hour   Intake 10 ml  Output --   Net 10 ml       Medications:   Scheduled Meds:   albuterol   2.5 mg Nebulization 4x Daily (RT)    ascorbic acid  (vitamin C )  1,000 mg Oral Daily    atorvastatin   10 mg Oral Daily    azithromycin   250 mg Oral Daily    cefTAZidime   2 g Intravenous Q8H SCH    cholecalciferol  (vitamin D3 25 mcg (1,000 units))  50 mcg Oral Daily    colistimethate  (COLYMYCIN) 150 mg in sodium chloride  (NS) 4 mL inhalation  150 mg Inhalation Q12H [facility]    dilTIAZem   120 mg Oral Daily    dornase alfa   2.5 mg Inhalation Daily (RT)    ferrous sulfate   325 mg Oral BID with meals    fluticasone  furoate-vilanterol  1 puff Inhalation Daily (RT)    And    umeclidinium  1 puff Inhalation Daily (RT)    HYDROcodone -acetaminophen   7.5 mg of hydrocodone  Oral TID    levoFLOXacin   750 mg Oral daily    montelukast   10 mg Oral Nightly    multivitamins (ADULT)  1 tablet Oral Daily    pantoprazole   20 mg Oral Daily before breakfast predniSONE   30 mg Oral Daily    Followed by    Cecily Cohen ON 08/02/2023] predniSONE   20 mg Oral Daily    Followed by    Cecily Cohen ON 08/07/2023] predniSONE   10 mg Oral Daily    sodium chloride   10 mL Intravenous Q8H    sodium chloride   5 mL Nebulization 4x Daily (RT)     Continuous Infusions:    D Donis Furnish, MD MD  [facility].

## 2023-07-28 NOTE — Unmapped (Signed)
 Pt received all scheduled inhaled medications.  Metaneb done for airway clearance.  Reports coughing up small amount of stringy, yellow/brown sputum.  On 4L .    Problem: Airway Clearance Ineffective  Goal: Effective Airway Clearance  Outcome: Ongoing - Unchanged  Intervention: Promote Airway Secretion Clearance  Recent Flowsheet Documentation  Taken 07/27/2023 2028 by Ardis Krebs, RRT  Breathing Techniques/Airway Clearance: deep/controlled cough encouraged

## 2023-07-28 NOTE — Unmapped (Signed)
 Patient is alert and oriented x4. VSS on 4L O2. Continuous pulse ox monitoring continued, and no calls from tele up to this point in shift. Tolerating regular diet and taking pills whole. Up to chair for most of day. Continent of bowel and bladder. Ambulatory in room independently. Tolerating IV antibiotics. PICC line is intact with brisk blood return. Wife visiting at bedside. Contact precautions maintained. Universal fall precautions in place.     12hr chart check completed.       Problem: Adult Inpatient Plan of Care  Goal: Plan of Care Review  Outcome: Ongoing - Unchanged  Goal: Patient-Specific Goal (Individualized)  Outcome: Ongoing - Unchanged  Goal: Absence of Hospital-Acquired Illness or Injury  Outcome: Ongoing - Unchanged  Intervention: Identify and Manage Fall Risk  Recent Flowsheet Documentation  Taken 07/28/2023 1400 by Rogue Clear, RN  Safety Interventions: fall reduction program maintained  Taken 07/28/2023 1200 by Rogue Clear, RN  Safety Interventions:   fall reduction program maintained   lighting adjusted for tasks/safety   low bed  Taken 07/28/2023 1000 by Rogue Clear, RN  Safety Interventions:   fall reduction program maintained   lighting adjusted for tasks/safety   low bed  Taken 07/28/2023 0800 by Rogue Clear, RN  Safety Interventions:   fall reduction program maintained   lighting adjusted for tasks/safety   low bed  Intervention: Prevent Skin Injury  Recent Flowsheet Documentation  Taken 07/28/2023 1400 by Rogue Clear, RN  Positioning for Skin: Sitting in Chair  Taken 07/28/2023 1200 by Rogue Clear, RN  Positioning for Skin: Sitting in Chair  Taken 07/28/2023 1000 by Rogue Clear, RN  Positioning for Skin: Sitting in Chair  Taken 07/28/2023 0800 by Rogue Clear, RN  Positioning for Skin: Sitting in Chair  Intervention: Prevent Infection  Recent Flowsheet Documentation  Taken 07/28/2023 1400 by Rogue Clear, RN  Infection Prevention: personal protective equipment utilized  Taken 07/28/2023 1200 by Rogue Clear, RN  Infection Prevention: personal protective equipment utilized  Taken 07/28/2023 1000 by Rogue Clear, RN  Infection Prevention:   cohorting utilized   personal protective equipment utilized   single patient room provided  Taken 07/28/2023 0800 by Rogue Clear, RN  Infection Prevention:   cohorting utilized   personal protective equipment utilized  Goal: Optimal Comfort and Wellbeing  Outcome: Ongoing - Unchanged  Goal: Readiness for Transition of Care  Outcome: Ongoing - Unchanged  Goal: Rounds/Family Conference  Outcome: Ongoing - Unchanged     Problem: Infection  Goal: Absence of Infection Signs and Symptoms  Outcome: Ongoing - Unchanged  Intervention: Prevent or Manage Infection  Recent Flowsheet Documentation  Taken 07/28/2023 1400 by Rogue Clear, RN  Isolation Precautions: contact precautions maintained  Taken 07/28/2023 1200 by Rogue Clear, RN  Isolation Precautions: contact precautions maintained  Taken 07/28/2023 1000 by Rogue Clear, RN  Infection Management: aseptic technique maintained  Isolation Precautions: contact precautions maintained  Taken 07/28/2023 0800 by Rogue Clear, RN  Infection Management: aseptic technique maintained  Isolation Precautions: contact precautions maintained     Problem: Airway Clearance Ineffective  Goal: Effective Airway Clearance  Outcome: Ongoing - Unchanged     Problem: Fall Injury Risk  Goal: Absence of Fall and Fall-Related Injury  Outcome: Ongoing - Unchanged  Intervention: Promote Injury-Free Environment  Recent Flowsheet Documentation  Taken 07/28/2023 1400 by Rogue Clear, RN  Safety Interventions: fall reduction program maintained  Taken 07/28/2023 1200 by Rogue Clear, RN  Safety Interventions:   fall reduction program maintained   lighting  adjusted for tasks/safety   low bed  Taken 07/28/2023 1000 by Rogue Clear, RN  Safety Interventions:   fall reduction program maintained   lighting adjusted for tasks/safety   low bed  Taken 07/28/2023 0800 by Rogue Clear, RN  Safety Interventions:   fall reduction program maintained   lighting adjusted for tasks/safety   low bed

## 2023-07-29 MED ADMIN — albuterol 2.5 mg /3 mL (0.083 %) nebulizer solution 2.5 mg: 2.5 mg | RESPIRATORY_TRACT | @ 13:00:00

## 2023-07-29 MED ADMIN — levoFLOXacin (LEVAQUIN) tablet 750 mg: 750 mg | ORAL | @ 09:00:00 | Stop: 2023-08-06

## 2023-07-29 MED ADMIN — montelukast (SINGULAIR) tablet 10 mg: 10 mg | ORAL | @ 01:00:00

## 2023-07-29 MED ADMIN — guaiFENesin (ROBITUSSIN) oral syrup: 200 mg | ORAL | @ 01:00:00

## 2023-07-29 MED ADMIN — predniSONE (DELTASONE) tablet 30 mg: 30 mg | ORAL | @ 14:00:00 | Stop: 2023-08-02

## 2023-07-29 MED ADMIN — albuterol 2.5 mg /3 mL (0.083 %) nebulizer solution 2.5 mg: 2.5 mg | RESPIRATORY_TRACT | @ 21:00:00

## 2023-07-29 MED ADMIN — HYDROcodone-acetaminophen (HYCET) 7.5-325 mg/15 mL solution 7.5 mg of hydrocodone: 7.5 mg | ORAL | @ 22:00:00 | Stop: 2023-08-06

## 2023-07-29 MED ADMIN — cefTAZidime (FORTAZ) 2 g in sodium chloride 0.9 % (NS) 100 mL IVPB-MBP: 2 g | INTRAVENOUS | @ 09:00:00 | Stop: 2023-08-06

## 2023-07-29 MED ADMIN — azithromycin (ZITHROMAX) tablet 250 mg: 250 mg | ORAL | @ 14:00:00 | Stop: 2023-08-06

## 2023-07-29 MED ADMIN — sodium chloride (NS) 0.9 % flush 10 mL: 10 mL | INTRAVENOUS | @ 01:00:00

## 2023-07-29 MED ADMIN — multivitamins, therapeutic with minerals tablet 1 tablet: 1 | ORAL | @ 14:00:00

## 2023-07-29 MED ADMIN — HYDROcodone-acetaminophen (HYCET) 7.5-325 mg/15 mL solution 7.5 mg of hydrocodone: 7.5 mg | ORAL | @ 09:00:00 | Stop: 2023-08-06

## 2023-07-29 MED ADMIN — sodium chloride 10 % NEBULIZER solution 5 mL: 5 mL | RESPIRATORY_TRACT | @ 21:00:00

## 2023-07-29 MED ADMIN — ascorbic acid (vitamin C) (VITAMIN C) tablet 1,000 mg: 1000 mg | ORAL | @ 14:00:00

## 2023-07-29 MED ADMIN — sodium chloride 10 % NEBULIZER solution 5 mL: 5 mL | RESPIRATORY_TRACT | @ 13:00:00

## 2023-07-29 MED ADMIN — atorvastatin (LIPITOR) tablet 10 mg: 10 mg | ORAL | @ 01:00:00

## 2023-07-29 MED ADMIN — sodium chloride (NS) 0.9 % flush 10 mL: 10 mL | INTRAVENOUS | @ 17:00:00

## 2023-07-29 MED ADMIN — albuterol 2.5 mg /3 mL (0.083 %) nebulizer solution 2.5 mg: 2.5 mg | RESPIRATORY_TRACT | @ 01:00:00

## 2023-07-29 MED ADMIN — ferrous sulfate tablet 325 mg: 325 mg | ORAL | @ 22:00:00

## 2023-07-29 MED ADMIN — ferrous sulfate tablet 325 mg: 325 mg | ORAL | @ 14:00:00

## 2023-07-29 MED ADMIN — sodium chloride 10 % NEBULIZER solution 5 mL: 5 mL | RESPIRATORY_TRACT | @ 01:00:00

## 2023-07-29 MED ADMIN — fluticasone furoate-vilanterol (BREO ELLIPTA) 100-25 mcg/dose inhaler 1 puff: 1 | RESPIRATORY_TRACT | @ 13:00:00

## 2023-07-29 MED ADMIN — dornase alfa (PULMOZYME) 1 mg/mL solution 2.5 mg: 2.5 mg | RESPIRATORY_TRACT | @ 13:00:00

## 2023-07-29 MED ADMIN — cefTAZidime (FORTAZ) 2 g in sodium chloride 0.9 % (NS) 100 mL IVPB-MBP: 2 g | INTRAVENOUS | @ 19:00:00 | Stop: 2023-08-06

## 2023-07-29 MED ADMIN — cefTAZidime (FORTAZ) 2 g in sodium chloride 0.9 % (NS) 100 mL IVPB-MBP: 2 g | INTRAVENOUS | @ 01:00:00 | Stop: 2023-08-06

## 2023-07-29 MED ADMIN — colistimethate (COLYMYCIN) 150 mg in sodium chloride (NS) 4 mL inhalation: 150 mg | RESPIRATORY_TRACT | @ 01:00:00

## 2023-07-29 MED ADMIN — guaiFENesin (ROBITUSSIN) oral syrup: 200 mg | ORAL | @ 09:00:00

## 2023-07-29 MED ADMIN — HYDROcodone-acetaminophen (HYCET) 7.5-325 mg/15 mL solution 7.5 mg of hydrocodone: 7.5 mg | ORAL | @ 17:00:00 | Stop: 2023-08-06

## 2023-07-29 MED ADMIN — albuterol 2.5 mg /3 mL (0.083 %) nebulizer solution 2.5 mg: 2.5 mg | RESPIRATORY_TRACT | @ 16:00:00

## 2023-07-29 MED ADMIN — dilTIAZem (CARDIZEM CD) 24 hr capsule 120 mg: 120 mg | ORAL | @ 14:00:00

## 2023-07-29 MED ADMIN — pantoprazole (Protonix) EC tablet 20 mg: 20 mg | ORAL | @ 14:00:00

## 2023-07-29 MED ADMIN — cholecalciferol (vitamin D3 25 mcg (1,000 units)) tablet 50 mcg: 50 ug | ORAL | @ 14:00:00

## 2023-07-29 MED ADMIN — colistimethate (COLYMYCIN) 150 mg in sodium chloride (NS) 4 mL inhalation: 150 mg | RESPIRATORY_TRACT | @ 13:00:00

## 2023-07-29 MED ADMIN — umeclidinium (INCRUSE ELLIPTA) 62.5 mcg/actuation inhaler 1 puff: 1 | RESPIRATORY_TRACT | @ 13:00:00

## 2023-07-29 MED ADMIN — sodium chloride (NS) 0.9 % flush 10 mL: 10 mL | INTRAVENOUS | @ 10:00:00

## 2023-07-29 MED ADMIN — sodium chloride 10 % NEBULIZER solution 5 mL: 5 mL | RESPIRATORY_TRACT | @ 16:00:00

## 2023-07-29 NOTE — Unmapped (Signed)
 [facility] Hospitalist Daily Progress Note     LOS: 6 days     Assessment/Plan:  Principal Problem:    Bronchiectasis  Active Problems:    Essential hypertension    Gastroesophageal reflux disease without esophagitis    Acute on chronic respiratory failure with hypoxemia    Sensory neuropathy         Jordan [name] is a 68 y.o. male who presented to [facility] with Bronchiectasis.     Bronchiectasis   Follows with Dr Sharion Davidson, seen in clinic on 07/23/23. Chronic Pseudomonas infection. Worsened hypoxia (2L at baseline) and dyspnea with exertion. Requiring up to 6L, remains on 4 L this morning. Sputum culture growing mucoid Pseudomonas. Continue inpatient for aggressive pulmonary toilet.    - Pulm consult  - IV ceftazidime , PO levaquin , inhaled colistin  - Airway clearance with metanbeb/volera, hypertonic saline 10% nebs QID with albuterol  pre-treatment  - continue dornase and formulary equivalent of home inhalers.    - Continue montelukast  and chronic azithromycin .    - Prednisone  taper (40mg  x5d, taper by 10mg  q5d)   - Follow-up CMP, CBC, ESR, CRP.    - Goal SpO2 88-92%.  continuous O2 monitoring. Wean as able.   - PFTs today       Asthma with allergic component:    On dupixent  at home. No wheezing on exam this morning.   - Singulair  and inhalers.    - Pulmonary Medicine will coordinate for Ohtuvayre  and dupixent      Paroxysmal SVT:    - Diltiazem  120 QD  - Avoiding anticoagulation for him if any Afib     Chronic pain:  from lumbosacral radiculopathy - on norco  at home, this has been continued.      DVT prophylaxis. Ambulatory      Disposition: Floor.  Possible [facility]     Please page the [facility] ([facility]) pager at [phone] with questions.      Pending labs:       Subjective:   He felt like he had a few periods of mucus plugging overnight. These would last for a few minutes before resolving. He notes his SpO2 still drops when up to the bathroom on 4L.        Objective:   Physical Exam:    GEN: NAD, sitting up to chair. RESPIRATORY: Breathing pattern was normal and the chest moved symmetrically.  Lung sounds were diminished and there were no adventitious sounds. No rhonchi or wheezing    CARDIOVASCULAR: Heart rate and rhythm were normal.  S1 and S2 were normal and there were no extra sounds or murmurs.  ABDOMEN: The abdomen was normal in contour.  Bowel sounds were present. Palpation detected no tenderness.   GENITOURINARY - foley absent.    NEUROLOGIC: Mental status was normal.  The patient was normally coordinated in the arms.   PSYCHIATRIC: The patient was oriented to person, place, time, and circumstance. Speech was normal. Mood and affect were normal    Vital signs in last 24 hours:  Temp:  [36.2 ??C (97.2 ??F)-36.6 ??C (97.9 ??F)] 36.6 ??C (97.9 ??F)  Pulse:  [60-103] 88  Resp:  [16-18] 18  BP: (113-147)/(63-83) 132/83  MAP (mmHg):  [78-96] 96  SpO2:  [92 %-99 %] 99 %    Intake/Output last 24 hours:  No intake or output data in the 24 hours ending 07/29/23 0716      Medications:   Scheduled Meds:   albuterol   2.5 mg Nebulization 4x Daily (RT)  ascorbic acid  (vitamin C )  1,000 mg Oral Daily    atorvastatin   10 mg Oral Daily    azithromycin   250 mg Oral Daily    cefTAZidime   2 g Intravenous Q8H SCH    cholecalciferol  (vitamin D3 25 mcg (1,000 units))  50 mcg Oral Daily    colistimethate  (COLYMYCIN) 150 mg in sodium chloride  (NS) 4 mL inhalation  150 mg Inhalation Q12H SCH    dilTIAZem   120 mg Oral Daily    dornase alfa   2.5 mg Inhalation Daily (RT)    ferrous sulfate   325 mg Oral BID with meals    fluticasone  furoate-vilanterol  1 puff Inhalation Daily (RT)    And    umeclidinium  1 puff Inhalation Daily (RT)    HYDROcodone -acetaminophen   7.5 mg of hydrocodone  Oral TID    levoFLOXacin   750 mg Oral daily    montelukast   10 mg Oral Nightly    multivitamins (ADULT)  1 tablet Oral Daily    pantoprazole   20 mg Oral Daily before breakfast    predniSONE   30 mg Oral Daily    Followed by    Cecily Cohen ON 08/02/2023] predniSONE   20 mg Oral Daily    Followed by    Cecily Cohen ON 08/07/2023] predniSONE   10 mg Oral Daily    sodium chloride   10 mL Intravenous Q8H    sodium chloride   5 mL Nebulization 4x Daily (RT)     Continuous Infusions:    D Donis Furnish, MD MD  [facility] Service.

## 2023-07-29 NOTE — Unmapped (Signed)
 General Pulmonary Team Follow Up Consult Note     Date of Service: 07/29/2023  Requesting Physician: Emogene Harpin, MD   Requesting Service: Med Undesignated (MDX)  Reason for consultation: Comprehensive evaluation of bronchiectasis.    Hospital Problems:  Principal Problem:    Bronchiectasis  Active Problems:    Essential hypertension    Gastroesophageal reflux disease without esophagitis    Acute on chronic respiratory failure with hypoxemia    Sensory neuropathy      HPI: Jordan [name] is a 68 y.o. male with bronchiectasis, asthma/COPD overlap who presents for bronchiectasis exacerbation.     Problems addressed during this consult include bronchiectasis. Based on these problems, the patient has moderate risk of morbidity/mortality which is commensurate w their risk of management options described below in the recommendations.    Assessment      Interval Events: Continues to have difficulty expectorating sputum, worse last night when he felt like he had mucus plugs. Dyspnea on exertion with walking from bed to bathroom is a little better. PFTs yesterday unchanged from admission, FEV1 36%, from 33.6% on admission.       Impression: The patient has made minimal clinical progress since last encounter. I personally reviewed most recent pertinent labs, imaging and micro data, from 07/29/2023, which are noted in recommendations..        Recommendations     Antibiotics:  Based on prior culture data (OPF, PsA) and allergy profile, favor treating with:  IV ceftazidime   Oral levofloxacin   Inhaled colistin  LRCX +pseudomonas  S/p PICC line placement for IV antibiotics  Continue chronic azithromycin  for anti-inflammatory effect.  Lower respiratory culture +PsA     Other medications:  Increase prednisone  back to 40 mg daily.  Convert Trelegy to formulary equivalent.  May change to Trelegy 200 after discharge.  COVID vaccine prior to discharge     Airway clearance: Needs aggressive airway clearance 4 times per day.  Nebulized hypertonic saline 10%; pre-treatment with albuterol .  For mechanical clearance, prefer metaneb and Vibralung.  Pulmozyme  2.5 mg nebulized daily  Add chest vest for airway clearance     Consults:  Pulmonology     Labs:  On admission, CBC-D, CMP, CRP, ESR.  Sputum for bacterial and AFB cultures (NTM screening) sent from clinic.   CRP and ESR every 4-5 days to trend (including as outpatient).  [facility] Guideline for Monitoring Outpatient Intravenous and Oral Antimicrobial Therapy recommended monitoring labs ([email or web address])     Imaging:  None     Other Testing:  Patient may need FVL performed while in the hospital depending on how he feels on Monday. If continuing IV antibiotics, would obtain prior to leaving hospital.     [facility]  Patient is not a candidate for [facility] due to distance.     Dispo:   Patient will need follow-up appointment with me in 2-3 months with FVL.  Plan for PICC placement for outpatient IV antibiotics.  Patient does not need to remain in the hospital for the entire course of IV antibiotics.   Please reach out to [facility] Bronchiectasis/NTM Care and [facility] team (myself, Leota Randy RN, Ballard Levels CPP) prior to discharge to ensure that home infusion orders are correct and that we are comfortable with patient discharging home on IVs at that time.  We will follow monitoring labs.  Please use DOTPULMIVDISCHARGE for home infusion orders. We will follow monitoring labs. Does not need OPAT.  Do not discharge Friday  through Sunday or on a holiday as no outpatient clinical staff available to assist if problems arise with home health/home infusion.     The recommendations outlined in this note were discussed w the primary team direct (in-person). Please do not hesitate to page [phone] [facility] consult) with questions. We appreciate the opportunity to assist in the care of this patient. We look forward to following with you.    Tonna Frederic, MD    Subjective & Objective     Vitals - past 24 hours  Temp:  [36.2 ??C (97.2 ??F)-36.8 ??C (98.2 ??F)] 36.8 ??C (98.2 ??F)  Pulse:  [60-106] 106  Resp:  [16-18] 18  BP: (113-141)/(61-83) 141/61  SpO2:  [90 %-99 %] 90 % Intake/Output  I/O last 3 completed shifts:  In: 10 [I.V.:10]  Out: -       Pertinent exam findings:   General appearance - well appearing, well nourished, and in no distress  Eyes - EOMI, PERRLA, anicteric sclerae, pink conjunctiva  Mouth - not examined  Neck - Trachea midline, normal neck movement  Lymphatics - not examined  Heart - regular rate and rhythm, normal S1/S2, no gallops, rubs, or murmurs  Chest - good air movement, wheezes in the bilateral lower lobes  Abdomen - soft, nontender, nondistended, no masses or organomegaly  Extremities - No lower extremity edema, no clubbing or cyanosis  Skin - normal coloration and turgor, no rashes, no suspicious skin lesions noted  Neurological - alert, oriented, normal speech, no focal findings or movement disorder noted    Relevant Imaging:  - CXR on 07/23/23 revealed Right apical pleural thickening with scarring and volume loss in the right lung apex. Elevated right hemidiaphragm. Extensive bronchiectasis and bronchial wall thickening bilaterally.     Arterial Blood Gas:   No results for input(s): SPECTYPEART, PHART, PCO2ART, PO2ART, HCO3ART, BEART, O2SATART in the last 24 hours.     Venous Blood Gas:   No results for input(s): PHVEN, PCO2VEN, PO2VEN, HCO3VEN, BEVEN, O2SATVEN in the last 24 hours.     Cultures:  Lower Respiratory Culture (no units)   Date Value   07/23/2023 4+ Mucoid Pseudomonas aeruginosa (A)   07/23/2023 3+ Oropharyngeal Flora Isolated     WBC (10*9/L)   Date Value   07/23/2023 8.3          Other Labs:  Lab Results Component Value Date    WBC 8.3 07/23/2023    HGB 13.8 07/23/2023    HCT 40.4 07/23/2023    PLT 200 07/23/2023     Lab Results   Component Value Date    NA 142 07/23/2023    K 4.1 07/23/2023    CL 102 07/23/2023    CO2 25.4 07/23/2023    BUN 27 (H) 07/23/2023    CREATININE 1.07 07/23/2023    GLU 114 07/23/2023    CALCIUM  10.0 07/23/2023    MG 1.5 (L) 12/22/2021    PHOS 3.7 01/22/2016     Lab Results   Component Value Date    BILITOT 0.2 (L) 07/23/2023    BILIDIR 0.40 04/02/2015    PROT 7.8 07/23/2023    ALBUMIN 3.7 07/23/2023    ALT 16 07/23/2023    AST 18 07/23/2023    ALKPHOS 96 07/23/2023    GGT 64 06/21/2013     Lab Results   Component Value Date    LABPROT 10.4 08/03/2013    INR 0.95 04/14/2017    APTT 32.6 07/25/2014  Allergies & Home Medications   Personally reviewed in Epic    Continuous Infusions:       Scheduled Medications:    albuterol   2.5 mg Nebulization 4x Daily (RT)    ascorbic acid  (vitamin C )  1,000 mg Oral Daily    atorvastatin   10 mg Oral Daily    azithromycin   250 mg Oral Daily    cefTAZidime   2 g Intravenous Q8H SCH    cholecalciferol  (vitamin D3 25 mcg (1,000 units))  50 mcg Oral Daily    colistimethate  (COLYMYCIN) 150 mg in sodium chloride  (NS) 4 mL inhalation  150 mg Inhalation Q12H [facility]    dilTIAZem   120 mg Oral Daily    dornase alfa   2.5 mg Inhalation Daily (RT)    ferrous sulfate   325 mg Oral BID with meals    fluticasone  furoate-vilanterol  1 puff Inhalation Daily (RT)    And    umeclidinium  1 puff Inhalation Daily (RT)    HYDROcodone -acetaminophen   7.5 mg of hydrocodone  Oral TID    levoFLOXacin   750 mg Oral daily    montelukast   10 mg Oral Nightly    multivitamins (ADULT)  1 tablet Oral Daily    pantoprazole   20 mg Oral Daily before breakfast    predniSONE   30 mg Oral Daily    Followed by    Cecily Cohen ON 08/02/2023] predniSONE   20 mg Oral Daily    Followed by    Cecily Cohen ON 08/07/2023] predniSONE   10 mg Oral Daily    sodium chloride   10 mL Intravenous Q8H    sodium chloride   5 mL Nebulization 4x Daily (RT)       PRN medications:  guaiFENesin , ondansetron  **OR** ondansetron 

## 2023-07-29 NOTE — Unmapped (Signed)
 Patient alert and oriented x4.  VSS.  Receiving IV antibiotics.  No complaint of pain or discomfort at this time.  Ambulates independently.  Takes pills whole without difficulty.  Continent of bowel and bladder.  Breathing treatments per RT.  Call light in reach.    Problem: Adult Inpatient Plan of Care  Goal: Plan of Care Review  Outcome: Progressing  Goal: Patient-Specific Goal (Individualized)  Outcome: Progressing  Goal: Absence of Hospital-Acquired Illness or Injury  Outcome: Progressing  Intervention: Identify and Manage Fall Risk  Recent Flowsheet Documentation  Taken 07/29/2023 1400 by Charleston Conrad, RN  Safety Interventions: fall reduction program maintained  Taken 07/29/2023 0800 by Charleston Conrad, RN  Safety Interventions: fall reduction program maintained  Goal: Optimal Comfort and Wellbeing  Outcome: Progressing  Goal: Readiness for Transition of Care  Outcome: Progressing  Goal: Rounds/Family Conference  Outcome: Progressing     Problem: Infection  Goal: Absence of Infection Signs and Symptoms  Outcome: Progressing  Intervention: Prevent or Manage Infection  Recent Flowsheet Documentation  Taken 07/29/2023 1400 by Charleston Conrad, RN  Isolation Precautions: contact precautions maintained  Taken 07/29/2023 0800 by Charleston Conrad, RN  Isolation Precautions: contact precautions maintained     Problem: Airway Clearance Ineffective  Goal: Effective Airway Clearance  Outcome: Progressing     Problem: Fall Injury Risk  Goal: Absence of Fall and Fall-Related Injury  Outcome: Progressing  Intervention: Promote Injury-Free Environment  Recent Flowsheet Documentation  Taken 07/29/2023 1400 by Charleston Conrad, RN  Safety Interventions: fall reduction program maintained  Taken 07/29/2023 0800 by Charleston Conrad, RN  Safety Interventions: fall reduction program maintained

## 2023-07-29 NOTE — Unmapped (Signed)
 Pt received all inhaled medications this shift.  Airway clearance done with metaneb.  Pt reports small amounts of sputum production.  O2 at 4L.    Problem: Airway Clearance Ineffective  Goal: Effective Airway Clearance  Outcome: Ongoing - Unchanged  Intervention: Promote Airway Secretion Clearance  Recent Flowsheet Documentation  Taken 07/28/2023 2049 by Ardis Krebs, RRT  Breathing Techniques/Airway Clearance: deep/controlled cough encouraged

## 2023-07-29 NOTE — Unmapped (Signed)
 Pt alert and oriented x4. Vss. No complaints of pain overnight. IV abx infused per orders without difficulty. PICC line flushes and has adequate blood return. PICC dressing up to date. Pt remains free from falls. Call bell within reach.   Problem: Adult Inpatient Plan of Care  Goal: Plan of Care Review  Outcome: Progressing  Goal: Patient-Specific Goal (Individualized)  Outcome: Progressing  Goal: Absence of Hospital-Acquired Illness or Injury  Outcome: Progressing  Intervention: Identify and Manage Fall Risk  Recent Flowsheet Documentation  Taken 07/28/2023 2000 by Buford Carnes, RN  Safety Interventions:   fall reduction program maintained   low bed   nonskid shoes/slippers when out of bed  Intervention: Prevent Skin Injury  Recent Flowsheet Documentation  Taken 07/28/2023 2000 by Buford Carnes, RN  Positioning for Skin: Sitting in Chair  Goal: Optimal Comfort and Wellbeing  Outcome: Progressing  Goal: Readiness for Transition of Care  Outcome: Progressing  Goal: Rounds/Family Conference  Outcome: Progressing     Problem: Infection  Goal: Absence of Infection Signs and Symptoms  Outcome: Progressing  Intervention: Prevent or Manage Infection  Recent Flowsheet Documentation  Taken 07/28/2023 2000 by Buford Carnes, RN  Isolation Precautions: contact precautions maintained     Problem: Airway Clearance Ineffective  Goal: Effective Airway Clearance  Outcome: Progressing     Problem: Fall Injury Risk  Goal: Absence of Fall and Fall-Related Injury  Outcome: Progressing  Intervention: Promote Injury-Free Environment  Recent Flowsheet Documentation  Taken 07/28/2023 2000 by Buford Carnes, RN  Safety Interventions:   fall reduction program maintained   low bed   nonskid shoes/slippers when out of bed

## 2023-07-29 NOTE — Unmapped (Signed)
 Patient was compliant with all breathing treatments as ordered and used metaneb and chest vest for airway clearance.  Still requires 4LNC.    Problem: Airway Clearance Ineffective  Goal: Effective Airway Clearance  Outcome: Ongoing - Unchanged

## 2023-07-30 MED ADMIN — atorvastatin (LIPITOR) tablet 10 mg: 10 mg | ORAL | @ 02:00:00

## 2023-07-30 MED ADMIN — cefTAZidime (FORTAZ) 2 g in sodium chloride 0.9 % (NS) 100 mL IVPB-MBP: 2 g | INTRAVENOUS | @ 10:00:00 | Stop: 2023-08-06

## 2023-07-30 MED ADMIN — sodium chloride 10 % NEBULIZER solution 5 mL: 5 mL | RESPIRATORY_TRACT | @ 12:00:00

## 2023-07-30 MED ADMIN — HYDROcodone-acetaminophen (HYCET) 7.5-325 mg/15 mL solution 7.5 mg of hydrocodone: 7.5 mg | ORAL | @ 10:00:00 | Stop: 2023-08-06

## 2023-07-30 MED ADMIN — HYDROcodone-acetaminophen (HYCET) 7.5-325 mg/15 mL solution 7.5 mg of hydrocodone: 7.5 mg | ORAL | @ 16:00:00 | Stop: 2023-08-06

## 2023-07-30 MED ADMIN — colistimethate (COLYMYCIN) 150 mg in sodium chloride (NS) 4 mL inhalation: 150 mg | RESPIRATORY_TRACT | @ 12:00:00

## 2023-07-30 MED ADMIN — fluticasone furoate-vilanterol (BREO ELLIPTA) 100-25 mcg/dose inhaler 1 puff: 1 | RESPIRATORY_TRACT | @ 12:00:00

## 2023-07-30 MED ADMIN — sodium chloride (NS) 0.9 % flush 10 mL: 10 mL | INTRAVENOUS | @ 02:00:00

## 2023-07-30 MED ADMIN — pantoprazole (Protonix) EC tablet 20 mg: 20 mg | ORAL | @ 12:00:00

## 2023-07-30 MED ADMIN — sodium chloride 10 % NEBULIZER solution 5 mL: 5 mL | RESPIRATORY_TRACT | @ 17:00:00

## 2023-07-30 MED ADMIN — umeclidinium (INCRUSE ELLIPTA) 62.5 mcg/actuation inhaler 1 puff: 1 | RESPIRATORY_TRACT | @ 12:00:00

## 2023-07-30 MED ADMIN — colistimethate (COLYMYCIN) 150 mg in sodium chloride (NS) 4 mL inhalation: 150 mg | RESPIRATORY_TRACT | @ 01:00:00

## 2023-07-30 MED ADMIN — sodium chloride (NS) 0.9 % flush 10 mL: 10 mL | INTRAVENOUS | @ 16:00:00

## 2023-07-30 MED ADMIN — multivitamins, therapeutic with minerals tablet 1 tablet: 1 | ORAL | @ 12:00:00

## 2023-07-30 MED ADMIN — ferrous sulfate tablet 325 mg: 325 mg | ORAL | @ 12:00:00

## 2023-07-30 MED ADMIN — montelukast (SINGULAIR) tablet 10 mg: 10 mg | ORAL | @ 02:00:00

## 2023-07-30 MED ADMIN — dornase alfa (PULMOZYME) 1 mg/mL solution 2.5 mg: 2.5 mg | RESPIRATORY_TRACT | @ 12:00:00

## 2023-07-30 MED ADMIN — sodium chloride (NS) 0.9 % flush 10 mL: 10 mL | INTRAVENOUS | @ 10:00:00

## 2023-07-30 MED ADMIN — cefTAZidime (FORTAZ) 2 g in sodium chloride 0.9 % (NS) 100 mL IVPB-MBP: 2 g | INTRAVENOUS | @ 19:00:00 | Stop: 2023-08-06

## 2023-07-30 MED ADMIN — guaiFENesin (ROBITUSSIN) oral syrup: 200 mg | ORAL | @ 03:00:00

## 2023-07-30 MED ADMIN — cefTAZidime (FORTAZ) 2 g in sodium chloride 0.9 % (NS) 100 mL IVPB-MBP: 2 g | INTRAVENOUS | @ 02:00:00 | Stop: 2023-08-06

## 2023-07-30 MED ADMIN — azithromycin (ZITHROMAX) tablet 250 mg: 250 mg | ORAL | @ 12:00:00 | Stop: 2023-08-06

## 2023-07-30 MED ADMIN — sodium chloride 10 % NEBULIZER solution 5 mL: 5 mL | RESPIRATORY_TRACT | @ 21:00:00

## 2023-07-30 MED ADMIN — albuterol 2.5 mg /3 mL (0.083 %) nebulizer solution 2.5 mg: 2.5 mg | RESPIRATORY_TRACT | @ 01:00:00

## 2023-07-30 MED ADMIN — ascorbic acid (vitamin C) (VITAMIN C) tablet 1,000 mg: 1000 mg | ORAL | @ 12:00:00

## 2023-07-30 MED ADMIN — predniSONE (DELTASONE) tablet 40 mg: 40 mg | ORAL | @ 12:00:00

## 2023-07-30 MED ADMIN — albuterol 2.5 mg /3 mL (0.083 %) nebulizer solution 2.5 mg: 2.5 mg | RESPIRATORY_TRACT | @ 17:00:00

## 2023-07-30 MED ADMIN — sodium chloride 10 % NEBULIZER solution 5 mL: 5 mL | RESPIRATORY_TRACT | @ 01:00:00

## 2023-07-30 MED ADMIN — dilTIAZem (CARDIZEM CD) 24 hr capsule 120 mg: 120 mg | ORAL | @ 12:00:00

## 2023-07-30 MED ADMIN — cholecalciferol (vitamin D3 25 mcg (1,000 units)) tablet 50 mcg: 50 ug | ORAL | @ 12:00:00

## 2023-07-30 MED ADMIN — ferrous sulfate tablet 325 mg: 325 mg | ORAL | @ 21:00:00

## 2023-07-30 MED ADMIN — albuterol 2.5 mg /3 mL (0.083 %) nebulizer solution 2.5 mg: 2.5 mg | RESPIRATORY_TRACT | @ 12:00:00

## 2023-07-30 MED ADMIN — albuterol 2.5 mg /3 mL (0.083 %) nebulizer solution 2.5 mg: 2.5 mg | RESPIRATORY_TRACT | @ 21:00:00

## 2023-07-30 MED ADMIN — levoFLOXacin (LEVAQUIN) tablet 750 mg: 750 mg | ORAL | @ 10:00:00 | Stop: 2023-08-06

## 2023-07-30 MED ADMIN — HYDROcodone-acetaminophen (HYCET) 7.5-325 mg/15 mL solution 7.5 mg of hydrocodone: 7.5 mg | ORAL | @ 21:00:00 | Stop: 2023-08-06

## 2023-07-30 NOTE — Unmapped (Signed)
 General Pulmonary Team Follow Up Consult Note     Date of Service: 07/30/2023  Requesting Physician: Emogene Harpin, MD   Requesting Service: Med Undesignated (MDX)  Reason for consultation: Comprehensive evaluation of bronchiectasis.    Hospital Problems:  Principal Problem:    Bronchiectasis    Active Problems:    Essential hypertension    Gastroesophageal reflux disease without esophagitis    Acute on chronic respiratory failure with hypoxemia      Sensory neuropathy      HPI: Jordan [name] is a 68 y.o. male with bronchiectasis, asthma/COPD overlap who presents for bronchiectasis exacerbation.     Problems addressed during this consult include bronchiectasis. Based on these problems, the patient has moderate risk of morbidity/mortality which is commensurate w their risk of management options described below in the recommendations.    Assessment      Interval Events: Reports that his cough is better on higher steroids.        Impression: The patient has made minimal clinical progress since last encounter. I personally reviewed most recent pertinent labs, imaging and micro data, from 07/30/2023, which are noted in recommendations..        Recommendations     Antibiotics:  Based on prior culture data (OPF, PsA) and allergy profile, favor treating with:  IV ceftazidime   Oral levofloxacin   Inhaled colistin  LRCX +pseudomonas  S/p PICC line placement for IV antibiotics  Continue chronic azithromycin  for anti-inflammatory effect.     Other medications:  Prednisone  40 mg daily x 5 days. Taper by 10 mg every 5 days.  Convert Trelegy to formulary equivalent.  May change to Trelegy 200 after discharge.  COVID vaccine prior to discharge     Airway clearance: Needs aggressive airway clearance 4 times per day.  Nebulized hypertonic saline 10%; pre-treatment with albuterol .  For mechanical clearance, prefer metaneb and Vibralung.  Pulmozyme  2.5 mg nebulized daily     Consults:  Pulmonology     Labs:  On admission, CBC-D, CMP, CRP, ESR.  Sputum for bacterial and AFB cultures (NTM screening) sent from clinic.   CRP and ESR every 4-5 days to trend (including as outpatient).  [facility] Guideline for Monitoring Outpatient Intravenous and Oral Antimicrobial Therapy recommended monitoring labs ([email or web address])     Imaging:  None     Other Testing:  Patient may need FVL performed while in the hospital depending on how he feels on Monday. If continuing IV antibiotics, would obtain prior to leaving hospital.     [facility]  Patient is not a candidate for [facility] due to distance.     Dispo:   Patient will need follow-up appointment with me in 2-3 months with FVL.  Plan for PICC placement for outpatient IV antibiotics.  Patient does not need to remain in the hospital for the entire course of IV antibiotics.   Please reach out to [facility] Bronchiectasis/NTM Care and [facility] team (myself, Leota Randy RN, Ballard Levels CPP) prior to discharge to ensure that home infusion orders are correct and that we are comfortable with patient discharging home on IVs at that time.  We will follow monitoring labs.  Please use DOTPULMIVDISCHARGE for home infusion orders. We will follow monitoring labs. Does not need OPAT.  Do not discharge Friday through Sunday or on a holiday as no outpatient clinical staff available to assist if problems arise with home health/home infusion.     The recommendations outlined in this note were  discussed w the primary team direct (in-person). Please do not hesitate to page [phone] [facility] consult) with questions. We appreciate the opportunity to assist in the care of this patient. We look forward to following with you.    Ellie Gutta, MD    Subjective & Objective     Vitals - past 24 hours  Temp:  [36.4 ??C (97.5 ??F)-36.6 ??C (97.9 ??F)] 36.6 ??C (97.9 ??F)  Pulse:  [71-108] 71  Resp:  [18] 18  BP: (132-152)/(72-78) 140/72  SpO2:  [93 %-100 %] 98 % Intake/Output  No intake/output data recorded.      Pertinent exam findings:   General appearance - well appearing, well nourished, and in no distress  Lymphatics - not examined  Heart - regular rate and rhythm, normal S1/S2, no gallops, rubs, or murmurs  Chest - rhonchi noted in the lower lobes bilaterally      Relevant Imaging:  - CXR on 07/23/23 revealed Right apical pleural thickening with scarring and volume loss in the right lung apex. Elevated right hemidiaphragm. Extensive bronchiectasis and bronchial wall thickening bilaterally.     Arterial Blood Gas:   No results for input(s): SPECTYPEART, PHART, PCO2ART, PO2ART, HCO3ART, BEART, O2SATART in the last 24 hours.     Venous Blood Gas:   No results for input(s): PHVEN, PCO2VEN, PO2VEN, HCO3VEN, BEVEN, O2SATVEN in the last 24 hours.     Cultures:  Lower Respiratory Culture (no units)   Date Value   07/23/2023 4+ Mucoid Pseudomonas aeruginosa (A)   07/23/2023 3+ Oropharyngeal Flora Isolated   07/23/2023 <1+ Smooth Pseudomonas aeruginosa (A)     WBC (10*9/L)   Date Value   07/23/2023 8.3          Other Labs:  Lab Results   Component Value Date    WBC 8.3 07/23/2023    HGB 13.8 07/23/2023    HCT 40.4 07/23/2023    PLT 200 07/23/2023     Lab Results   Component Value Date    NA 142 07/23/2023    K 4.1 07/23/2023    CL 102 07/23/2023    CO2 25.4 07/23/2023    BUN 27 (H) 07/23/2023    CREATININE 1.07 07/23/2023    GLU 114 07/23/2023    CALCIUM  10.0 07/23/2023    MG 1.5 (L) 12/22/2021    PHOS 3.7 01/22/2016     Lab Results   Component Value Date    BILITOT 0.2 (L) 07/23/2023    BILIDIR 0.40 04/02/2015    PROT 7.8 07/23/2023    ALBUMIN 3.7 07/23/2023    ALT 16 07/23/2023    AST 18 07/23/2023    ALKPHOS 96 07/23/2023    GGT 64 06/21/2013     Lab Results   Component Value Date LABPROT 10.4 08/03/2013    INR 0.95 04/14/2017    APTT 32.6 07/25/2014       Allergies & Home Medications   Personally reviewed in Epic    Continuous Infusions:       Scheduled Medications:    albuterol   2.5 mg Nebulization 4x Daily (RT)    ascorbic acid  (vitamin C )  1,000 mg Oral Daily    atorvastatin   10 mg Oral Daily    azithromycin   250 mg Oral Daily    cefTAZidime   2 g Intravenous Q8H SCH    cholecalciferol  (vitamin D3 25 mcg (1,000 units))  50 mcg Oral Daily    colistimethate  (COLYMYCIN) 150 mg in sodium chloride  (NS) 4 mL inhalation  150 mg Inhalation Q12H [facility]    dilTIAZem   120 mg Oral Daily    dornase alfa   2.5 mg Inhalation Daily (RT)    ferrous sulfate   325 mg Oral BID with meals    fluticasone  furoate-vilanterol  1 puff Inhalation Daily (RT)    And    umeclidinium  1 puff Inhalation Daily (RT)    HYDROcodone -acetaminophen   7.5 mg of hydrocodone  Oral TID    levoFLOXacin   750 mg Oral daily    montelukast   10 mg Oral Nightly    multivitamins (ADULT)  1 tablet Oral Daily    pantoprazole   20 mg Oral Daily before breakfast    predniSONE   40 mg Oral Daily    sodium chloride   10 mL Intravenous Q8H    sodium chloride   5 mL Nebulization 4x Daily (RT)       PRN medications:  guaiFENesin , ondansetron  **OR** ondansetron 

## 2023-07-30 NOTE — Unmapped (Signed)
 [place] Hospitalist Daily Progress Note     LOS: 7 days     Assessment/Plan:  Principal Problem:    Bronchiectasis    Active Problems:    Essential hypertension    Gastroesophageal reflux disease without esophagitis    Acute on chronic respiratory failure with hypoxemia      Sensory neuropathy         Jordan [name] is a 68 y.o. male who presented to [facility] with Bronchiectasis.     Bronchiectasis   Follows with Dr Sharion Davidson, seen in clinic on 07/23/23. Chronic Pseudomonas infection. Worsened hypoxia (2L at baseline) and dyspnea with exertion. Requiring up to 6L, remains on 4 L this morning. Sputum culture growing mucoid Pseudomonas. Continue inpatient for aggressive pulmonary toilet.  PFTs from 5/7 are unchanged from prior. More rhonchi noted on exam this morning immediately after breathing treatments.    - Pulm consult  - IV ceftazidime , PO levaquin , inhaled colistin  - Airway clearance with metanbeb/volera, hypertonic saline 10% nebs QID with albuterol  pre-treatment  - continue dornase and formulary equivalent of home inhalers.    - Continue montelukast  and chronic azithromycin .    - Prednisone  40mg  qd  - Follow-up CMP, CBC, ESR, CRP.    - Goal SpO2 88-92%.  continuous O2 monitoring. Wean as able.   - Chest vest for airway clearance       Asthma with allergic component:    On dupixent  at home. No wheezing but increased rhonchi this morning.   - Singulair  and inhalers.    - Pulmonary Medicine will coordinate for Ohtuvayre  and dupixent      Paroxysmal SVT:    - Diltiazem  120 QD  - Avoiding anticoagulation for him if any Afib     Chronic pain:  from lumbosacral radiculopathy - on norco  at home, this has been continued.      DVT prophylaxis. Ambulatory      Disposition: Floor.    Please page the [facility] (HBB) pager at [phone] with questions.      Pending labs:       Subjective:   Notes bilateral foot swelling for the past couple of days, otherwise no new complaints. He did not have the sensation of plugging last night while asleep.     Objective:   Physical Exam:    GEN: NAD, sitting up to chair.     RESPIRATORY: Breathing pattern was normal and the chest moved symmetrically.  Lung sounds were diminished and there was mild diffuse rhonchi present.   CARDIOVASCULAR: Heart rate and rhythm were normal.  S1 and S2 were normal and there were no extra sounds or murmurs.  ABDOMEN: The abdomen was normal in contour.  Bowel sounds were present. Palpation detected no tenderness.   GENITOURINARY - foley absent.    NEUROLOGIC: Mental status was normal.  The patient was normally coordinated in the arms.   PSYCHIATRIC: The patient was oriented to person, place, time, and circumstance. Speech was normal. Mood and affect were normal    Vital signs in last 24 hours:  Temp:  [36.4 ??C (97.5 ??F)-36.8 ??C (98.2 ??F)] 36.6 ??C (97.9 ??F)  Pulse:  [97-108] 108  Resp:  [18] 18  BP: (132-152)/(61-78) 152/76  MAP (mmHg):  [85-98] 98  SpO2:  [90 %-99 %] 99 %    Intake/Output last 24 hours:  No intake or output data in the 24 hours ending 07/30/23 0718      Medications:   Scheduled Meds:   albuterol   2.5 mg Nebulization 4x Daily (RT)    ascorbic acid  (vitamin C )  1,000 mg Oral Daily    atorvastatin   10 mg Oral Daily    azithromycin   250 mg Oral Daily    cefTAZidime   2 g Intravenous Q8H SCH    cholecalciferol  (vitamin D3 25 mcg (1,000 units))  50 mcg Oral Daily    colistimethate  (COLYMYCIN) 150 mg in sodium chloride  (NS) 4 mL inhalation  150 mg Inhalation Q12H SCH    dilTIAZem   120 mg Oral Daily    dornase alfa   2.5 mg Inhalation Daily (RT)    ferrous sulfate   325 mg Oral BID with meals    fluticasone  furoate-vilanterol  1 puff Inhalation Daily (RT)    And    umeclidinium  1 puff Inhalation Daily (RT)    HYDROcodone -acetaminophen   7.5 mg of hydrocodone  Oral TID    levoFLOXacin   750 mg Oral daily    montelukast   10 mg Oral Nightly    multivitamins (ADULT)  1 tablet Oral Daily    pantoprazole   20 mg Oral Daily before breakfast    predniSONE   30 mg Oral Daily    Followed by    Cecily Cohen ON 08/02/2023] predniSONE   20 mg Oral Daily    Followed by    Cecily Cohen ON 08/07/2023] predniSONE   10 mg Oral Daily    sodium chloride   10 mL Intravenous Q8H    sodium chloride   5 mL Nebulization 4x Daily (RT)     Continuous Infusions:    D Donis Furnish, MD MD  [facility] Service.

## 2023-07-30 NOTE — Unmapped (Signed)
 Pt resting in bed. VSS. Afebrile. A&O x4. Continuous pulse oximetry in place. 93-99% on 4L oxygen [place]. Contact precautions and falls precautions maintained. Call light within reach.    Problem: Adult Inpatient Plan of Care  Goal: Plan of Care Review  Outcome: Progressing  Goal: Patient-Specific Goal (Individualized)  Outcome: Progressing  Goal: Absence of Hospital-Acquired Illness or Injury  Outcome: Progressing  Intervention: Identify and Manage Fall Risk  Recent Flowsheet Documentation  Taken 07/29/2023 2200 by Baron Lichtenstein, RN  Safety Interventions: fall reduction program maintained  Taken 07/29/2023 2000 by Baron Lichtenstein, RN  Safety Interventions: fall reduction program maintained  Goal: Optimal Comfort and Wellbeing  Outcome: Progressing  Goal: Readiness for Transition of Care  Outcome: Progressing  Goal: Rounds/Family Conference  Outcome: Progressing     Problem: Infection  Goal: Absence of Infection Signs and Symptoms  Outcome: Progressing  Intervention: Prevent or Manage Infection  Recent Flowsheet Documentation  Taken 07/29/2023 2200 by Baron Lichtenstein, RN  Isolation Precautions: contact precautions maintained  Taken 07/29/2023 2000 by Baron Lichtenstein, RN  Isolation Precautions: contact precautions maintained     Problem: Airway Clearance Ineffective  Goal: Effective Airway Clearance  Outcome: Progressing     Problem: Fall Injury Risk  Goal: Absence of Fall and Fall-Related Injury  Outcome: Progressing  Intervention: Promote Injury-Free Environment  Recent Flowsheet Documentation  Taken 07/29/2023 2200 by Baron Lichtenstein, RN  Safety Interventions: fall reduction program maintained  Taken 07/29/2023 2000 by Baron Lichtenstein, RN  Safety Interventions: fall reduction program maintained

## 2023-07-30 NOTE — Unmapped (Signed)
 Patient alert and oriented x4.  VSS.  Ambulates independently.   Medicated with scheduled pain medication.  Continent of bowel and bladder.  Takes pills whole without difficulty.  Call light in reach.    Problem: Adult Inpatient Plan of Care  Goal: Plan of Care Review  Outcome: Progressing  Goal: Patient-Specific Goal (Individualized)  Outcome: Progressing  Goal: Absence of Hospital-Acquired Illness or Injury  Outcome: Progressing  Intervention: Identify and Manage Fall Risk  Recent Flowsheet Documentation  Taken 07/30/2023 1400 by Charleston Conrad, RN  Safety Interventions: fall reduction program maintained  Taken 07/30/2023 1200 by Charleston Conrad, RN  Safety Interventions: fall reduction program maintained  Taken 07/30/2023 1000 by Charleston Conrad, RN  Safety Interventions: fall reduction program maintained  Taken 07/30/2023 0800 by Charleston Conrad, RN  Safety Interventions: fall reduction program maintained  Goal: Optimal Comfort and Wellbeing  Outcome: Progressing  Goal: Readiness for Transition of Care  Outcome: Progressing  Goal: Rounds/Family Conference  Outcome: Progressing     Problem: Infection  Goal: Absence of Infection Signs and Symptoms  Outcome: Progressing  Intervention: Prevent or Manage Infection  Recent Flowsheet Documentation  Taken 07/30/2023 0800 by Charleston Conrad, RN  Isolation Precautions: contact precautions maintained     Problem: Airway Clearance Ineffective  Goal: Effective Airway Clearance  Outcome: Progressing     Problem: Fall Injury Risk  Goal: Absence of Fall and Fall-Related Injury  Outcome: Progressing  Intervention: Promote Injury-Free Environment  Recent Flowsheet Documentation  Taken 07/30/2023 1400 by Charleston Conrad, RN  Safety Interventions: fall reduction program maintained  Taken 07/30/2023 1200 by Charleston Conrad, RN  Safety Interventions: fall reduction program maintained  Taken 07/30/2023 1000 by Charleston Conrad, RN  Safety Interventions: fall reduction program maintained  Taken 07/30/2023 0800 by Charleston Conrad, RN  Safety Interventions: fall reduction program maintained

## 2023-07-30 NOTE — Unmapped (Signed)
 Patient was compliant with all breathing treatments as scheduled and used chest vest and metaneb for airway clearance.  Patient has productive cough.    Problem: Airway Clearance Ineffective  Goal: Effective Airway Clearance  Outcome: Progressing

## 2023-07-31 MED ADMIN — HYDROcodone-acetaminophen (HYCET) 7.5-325 mg/15 mL solution 7.5 mg of hydrocodone: 7.5 mg | ORAL | @ 17:00:00 | Stop: 2023-08-06

## 2023-07-31 MED ADMIN — atorvastatin (LIPITOR) tablet 10 mg: 10 mg | ORAL | @ 01:00:00

## 2023-07-31 MED ADMIN — sodium chloride 10 % NEBULIZER solution 5 mL: 5 mL | RESPIRATORY_TRACT | @ 20:00:00

## 2023-07-31 MED ADMIN — multivitamins, therapeutic with minerals tablet 1 tablet: 1 | ORAL | @ 13:00:00

## 2023-07-31 MED ADMIN — albuterol 2.5 mg /3 mL (0.083 %) nebulizer solution 2.5 mg: 2.5 mg | RESPIRATORY_TRACT | @ 16:00:00

## 2023-07-31 MED ADMIN — albuterol 2.5 mg /3 mL (0.083 %) nebulizer solution 2.5 mg: 2.5 mg | RESPIRATORY_TRACT

## 2023-07-31 MED ADMIN — umeclidinium (INCRUSE ELLIPTA) 62.5 mcg/actuation inhaler 1 puff: 1 | RESPIRATORY_TRACT | @ 13:00:00

## 2023-07-31 MED ADMIN — sodium chloride 10 % NEBULIZER solution 5 mL: 5 mL | RESPIRATORY_TRACT

## 2023-07-31 MED ADMIN — azithromycin (ZITHROMAX) tablet 250 mg: 250 mg | ORAL | @ 13:00:00 | Stop: 2023-08-06

## 2023-07-31 MED ADMIN — sodium chloride 10 % NEBULIZER solution 5 mL: 5 mL | RESPIRATORY_TRACT | @ 13:00:00

## 2023-07-31 MED ADMIN — cefTAZidime (FORTAZ) 2 g in sodium chloride 0.9 % (NS) 100 mL IVPB-MBP: 2 g | INTRAVENOUS | @ 01:00:00 | Stop: 2023-08-06

## 2023-07-31 MED ADMIN — ferrous sulfate tablet 325 mg: 325 mg | ORAL | @ 21:00:00

## 2023-07-31 MED ADMIN — dilTIAZem (CARDIZEM CD) 24 hr capsule 120 mg: 120 mg | ORAL | @ 13:00:00

## 2023-07-31 MED ADMIN — sodium chloride 10 % NEBULIZER solution 5 mL: 5 mL | RESPIRATORY_TRACT | @ 16:00:00

## 2023-07-31 MED ADMIN — fluticasone furoate-vilanterol (BREO ELLIPTA) 100-25 mcg/dose inhaler 1 puff: 1 | RESPIRATORY_TRACT | @ 13:00:00

## 2023-07-31 MED ADMIN — HYDROcodone-acetaminophen (HYCET) 7.5-325 mg/15 mL solution 7.5 mg of hydrocodone: 7.5 mg | ORAL | @ 21:00:00 | Stop: 2023-08-06

## 2023-07-31 MED ADMIN — pantoprazole (Protonix) EC tablet 20 mg: 20 mg | ORAL | @ 13:00:00

## 2023-07-31 MED ADMIN — dornase alfa (PULMOZYME) 1 mg/mL solution 2.5 mg: 2.5 mg | RESPIRATORY_TRACT | @ 13:00:00

## 2023-07-31 MED ADMIN — colistimethate (COLYMYCIN) 150 mg in sodium chloride (NS) 4 mL inhalation: 150 mg | RESPIRATORY_TRACT

## 2023-07-31 MED ADMIN — cholecalciferol (vitamin D3 25 mcg (1,000 units)) tablet 50 mcg: 50 ug | ORAL | @ 13:00:00

## 2023-07-31 MED ADMIN — cefTAZidime (FORTAZ) 2 g in sodium chloride 0.9 % (NS) 100 mL IVPB-MBP: 2 g | INTRAVENOUS | @ 18:00:00 | Stop: 2023-08-06

## 2023-07-31 MED ADMIN — sodium chloride (NS) 0.9 % flush 10 mL: 10 mL | INTRAVENOUS | @ 19:00:00

## 2023-07-31 MED ADMIN — albuterol 2.5 mg /3 mL (0.083 %) nebulizer solution 2.5 mg: 2.5 mg | RESPIRATORY_TRACT | @ 20:00:00

## 2023-07-31 MED ADMIN — cefTAZidime (FORTAZ) 2 g in sodium chloride 0.9 % (NS) 100 mL IVPB-MBP: 2 g | INTRAVENOUS | @ 10:00:00 | Stop: 2023-08-06

## 2023-07-31 MED ADMIN — colistimethate (COLYMYCIN) 150 mg in sodium chloride (NS) 4 mL inhalation: 150 mg | RESPIRATORY_TRACT | @ 13:00:00

## 2023-07-31 MED ADMIN — sodium chloride (NS) 0.9 % flush 10 mL: 10 mL | INTRAVENOUS | @ 01:00:00

## 2023-07-31 MED ADMIN — levoFLOXacin (LEVAQUIN) tablet 750 mg: 750 mg | ORAL | @ 10:00:00 | Stop: 2023-08-06

## 2023-07-31 MED ADMIN — HYDROcodone-acetaminophen (HYCET) 7.5-325 mg/15 mL solution 7.5 mg of hydrocodone: 7.5 mg | ORAL | @ 10:00:00 | Stop: 2023-08-06

## 2023-07-31 MED ADMIN — predniSONE (DELTASONE) tablet 40 mg: 40 mg | ORAL | @ 13:00:00

## 2023-07-31 MED ADMIN — sodium chloride (NS) 0.9 % flush 10 mL: 10 mL | INTRAVENOUS | @ 10:00:00

## 2023-07-31 MED ADMIN — ferrous sulfate tablet 325 mg: 325 mg | ORAL | @ 13:00:00

## 2023-07-31 MED ADMIN — montelukast (SINGULAIR) tablet 10 mg: 10 mg | ORAL | @ 01:00:00

## 2023-07-31 MED ADMIN — albuterol 2.5 mg /3 mL (0.083 %) nebulizer solution 2.5 mg: 2.5 mg | RESPIRATORY_TRACT | @ 13:00:00

## 2023-07-31 MED ADMIN — ascorbic acid (vitamin C) (VITAMIN C) tablet 1,000 mg: 1000 mg | ORAL | @ 13:00:00

## 2023-07-31 NOTE — Unmapped (Signed)
 Pt resting in bed at this time. VSS. Afebrile. A&O x4. Telemetry for continuous pulse oximetry in place. 4L oxygen via [place]. Antibiotics given as ordered. Contact precautions maintained.   Problem: Adult Inpatient Plan of Care  Goal: Plan of Care Review  Outcome: Progressing  Goal: Patient-Specific Goal (Individualized)  Outcome: Progressing  Goal: Absence of Hospital-Acquired Illness or Injury  Outcome: Progressing  Intervention: Identify and Manage Fall Risk  Recent Flowsheet Documentation  Taken 07/30/2023 2200 by Baron Lichtenstein, RN  Safety Interventions: fall reduction program maintained  Taken 07/30/2023 2000 by Baron Lichtenstein, RN  Safety Interventions: fall reduction program maintained  Goal: Optimal Comfort and Wellbeing  Outcome: Progressing  Goal: Readiness for Transition of Care  Outcome: Progressing  Goal: Rounds/Family Conference  Outcome: Progressing     Problem: Infection  Goal: Absence of Infection Signs and Symptoms  Outcome: Progressing  Intervention: Prevent or Manage Infection  Recent Flowsheet Documentation  Taken 07/30/2023 2200 by Baron Lichtenstein, RN  Isolation Precautions: contact precautions maintained  Taken 07/30/2023 2000 by Baron Lichtenstein, RN  Isolation Precautions: contact precautions maintained     Problem: Airway Clearance Ineffective  Goal: Effective Airway Clearance  Outcome: Progressing     Problem: Fall Injury Risk  Goal: Absence of Fall and Fall-Related Injury  Outcome: Progressing  Intervention: Promote Injury-Free Environment  Recent Flowsheet Documentation  Taken 07/30/2023 2200 by Baron Lichtenstein, RN  Safety Interventions: fall reduction program maintained  Taken 07/30/2023 2000 by Baron Lichtenstein, RN  Safety Interventions: fall reduction program maintained

## 2023-07-31 NOTE — Unmapped (Addendum)
 Problem: Airway Clearance Ineffective  Goal: Effective Airway Clearance  Outcome: Ongoing - Unchanged  Intervention: Promote Airway Secretion Clearance  Recent Flowsheet Documentation  Taken 07/30/2023 2019 by Marlene Simas, RRT  Breathing Techniques/Airway Clearance: deep/controlled cough encouraged   Patient was compliant with all breathing treatments as scheduled and used chest vest and metaneb for airway clearance.

## 2023-07-31 NOTE — Unmapped (Signed)
 [place] Hospitalist Daily Progress Note     LOS: 8 days     Assessment/Plan:  Principal Problem:    Bronchiectasis    Active Problems:    Essential hypertension    Gastroesophageal reflux disease without esophagitis    Acute on chronic respiratory failure with hypoxemia      Sensory neuropathy         Jordan [name] is a 69 y.o. male who presented to [facility] with Bronchiectasis.     Bronchiectasis   Follows with Dr Sharion Davidson, seen in clinic on 07/23/23. Chronic Pseudomonas infection. Worsened hypoxia (2L at baseline) and dyspnea with exertion. Requiring up to 6L, remains on 4 L this morning. Sputum culture growing mucoid Pseudomonas. Continue inpatient for aggressive pulmonary toilet.  PFTs from 5/7 are unchanged from prior.    - Pulm consult  - IV ceftazidime , PO levaquin , inhaled colistin  - Airway clearance with metanbeb/volera, hypertonic saline 10% nebs QID with albuterol  pre-treatment  - continue dornase and formulary equivalent of home inhalers.    - Continue montelukast  and chronic azithromycin .    - Prednisone  40mg  qd  - Follow-up CMP, CBC, ESR, CRP.    - Goal SpO2 88-92%.  continuous O2 monitoring. Wean as able.   - Chest vest for airway clearance       Asthma with allergic component:    On dupixent  at home. No wheezing this morning.   - Singulair  and inhalers.    - Pulmonary Medicine will coordinate for Ohtuvayre  and dupixent      Paroxysmal SVT:    - Diltiazem  120 QD  - Avoiding anticoagulation for him if any Afib     Chronic pain:  from lumbosacral radiculopathy - on norco  at home, this has been continued.      DVT prophylaxis. Ambulatory      Disposition: Floor.  DC home next week.       Please page the [facility] (HBB) pager at [phone] with questions.      Pending labs:       Subjective:   Notes bilateral foot swelling is better this morning.  States today is the first day since admission, that he is actually starting to feel better.     Objective:   Physical Exam:    GEN: NAD, sitting up to chair.     RESPIRATORY: Breathing pattern was normal and the chest moved symmetrically.  Lung sounds were diminished and there was faint bibasilar rhonchi present.   CARDIOVASCULAR: Heart rate and rhythm were normal.  S1 and S2 were normal and there were no extra sounds or murmurs.  ABDOMEN: The abdomen was normal in contour.  Bowel sounds were present. Palpation detected no tenderness.   GENITOURINARY - foley absent.    NEUROLOGIC: Mental status was normal.  The patient was normally coordinated in the arms.   PSYCHIATRIC: The patient was oriented to person, place, time, and circumstance. Speech was normal. Mood and affect were normal    Vital signs in last 24 hours:  Temp:  [36.1 ??C (96.9 ??F)-36.5 ??C (97.7 ??F)] 36.2 ??C (97.2 ??F)  Pulse:  [60-100] 80  Resp:  [17-18] 18  BP: (130-143)/(72-83) 135/83  MAP (mmHg):  [90-97] 97  SpO2:  [94 %-100 %] 97 %    Intake/Output last 24 hours:  No intake or output data in the 24 hours ending 07/31/23 0741      Medications:   Scheduled Meds:   albuterol   2.5 mg Nebulization 4x Daily (RT)  ascorbic acid  (vitamin C )  1,000 mg Oral Daily    atorvastatin   10 mg Oral Daily    azithromycin   250 mg Oral Daily    cefTAZidime   2 g Intravenous Q8H SCH    cholecalciferol  (vitamin D3 25 mcg (1,000 units))  50 mcg Oral Daily    colistimethate  (COLYMYCIN) 150 mg in sodium chloride  (NS) 4 mL inhalation  150 mg Inhalation Q12H [facility]    dilTIAZem   120 mg Oral Daily    dornase alfa   2.5 mg Inhalation Daily (RT)    ferrous sulfate   325 mg Oral BID with meals    fluticasone  furoate-vilanterol  1 puff Inhalation Daily (RT)    And    umeclidinium  1 puff Inhalation Daily (RT)    HYDROcodone -acetaminophen   7.5 mg of hydrocodone  Oral TID    levoFLOXacin   750 mg Oral daily    montelukast   10 mg Oral Nightly    multivitamins (ADULT)  1 tablet Oral Daily    pantoprazole   20 mg Oral Daily before breakfast    predniSONE   40 mg Oral Daily    sodium chloride   10 mL Intravenous Q8H    sodium chloride   5 mL Nebulization 4x Daily (RT)     Continuous Infusions:    D Donis Furnish, MD MD  [facility] Service.

## 2023-07-31 NOTE — Unmapped (Signed)
 General Pulmonary Team Follow Up Consult Note     Date of Service: 07/31/2023  Requesting Physician: Emogene Harpin, MD   Requesting Service: Med Undesignated (MDX)  Reason for consultation: Comprehensive evaluation of bronchiectasis.    Hospital Problems:  Principal Problem:    Bronchiectasis    Active Problems:    Essential hypertension    Gastroesophageal reflux disease without esophagitis    Acute on chronic respiratory failure with hypoxemia      Sensory neuropathy      HPI: Jordan [name] is a 68 y.o. male with bronchiectasis, asthma/COPD overlap who presents for bronchiectasis exacerbation.     Problems addressed during this consult include bronchiectasis. Based on these problems, the patient has moderate risk of morbidity/mortality which is commensurate w their risk of management options described below in the recommendations.    Assessment      Interval Events: starting to feel more energy. Likes having vest.       Impression: The patient has made significant clinical progress since last encounter. I personally reviewed most recent pertinent labs, imaging and micro data, from 07/31/2023, which are noted in recommendations..        Recommendations     Antibiotics:  Based on prior culture data (OPF, PsA) and allergy profile, favor treating with:  IV ceftazidime   Oral levofloxacin   Inhaled colistin  LRCX +pseudomonas  S/p PICC line placement for IV antibiotics  Continue chronic azithromycin  for anti-inflammatory effect.     Other medications:  Prednisone  40 mg daily x 5 days. Taper by 10 mg every 5 days.  Convert Trelegy to formulary equivalent.  May change to Trelegy 200 after discharge.  COVID vaccine prior to discharge     Airway clearance: Needs aggressive airway clearance 4 times per day.  Nebulized hypertonic saline 10%; pre-treatment with albuterol .  For mechanical clearance, prefer metaneb and Vibralung.  Pulmozyme  2.5 mg nebulized daily     Consults:  Pulmonology     Labs:  On admission, CBC-D, CMP, CRP, ESR.  Sputum for bacterial and AFB cultures (NTM screening) sent from clinic.   CRP and ESR every 4-5 days to trend (including as outpatient).  [facility] Guideline for Monitoring Outpatient Intravenous and Oral Antimicrobial Therapy recommended monitoring labs ([email or web address])     Imaging:  None     Other Testing:  Patient may need FVL performed while in the hospital depending on how he feels on Monday. If continuing IV antibiotics, would obtain prior to leaving hospital.     [facility]):  Patient is not a candidate for [facility] due to distance.     Dispo:   Patient will need follow-up appointment with bronchiectasis clinic in 2-3 months with FVL.  Patient does not need to remain in the hospital for the entire course of IV antibiotics.   Please reach out to [facility] Bron[facility]ctasis/[facility] team Leota Randy RN, [place] Levels CPP) prior to discharge to ensure that home infusion orders are correct and that we are comfortable with patient discharging home on IVs at that time.  We will follow monitoring labs.  Please use [email or web address] for home infusion orders. We will follow monitoring labs. Does not need OPAT.  Do not discharge Friday through Sunday or on a holiday as no outpatient clinical staff available to assist if problems arise with home health/home infusion.       Donzetta Galli, MD    Subjective & Objective     Vitals -  past 24 hours  Temp:  [36.1 ??C (96.9 ??F)-36.5 ??C (97.7 ??F)] 36.2 ??C (97.2 ??F)  Pulse:  [60-100] 80  Resp:  [17-18] 18  BP: (130-143)/(72-83) 135/83  SpO2:  [94 %-100 %] 97 % Intake/Output  No intake/output data recorded.      Pertinent exam findings:   General appearance - well appearing, well nourished, and in no distress  Heart - regular rate and rhythm, normal S1/S2, no gallops, rubs, or murmurs  Chest - rhonchi noted in the lower lobes bilaterally  Neuro: AAOx3  ENT: MMM, wearing nasal cannula      Relevant Imaging:  - CXR on 07/23/23 revealed Right apical pleural thickening with scarring and volume loss in the right lung apex. Elevated right hemidiaphragm. Extensive bronchiectasis and bronchial wall thickening bilaterally.     Cultures:  Lower Respiratory Culture (no units)   Date Value   07/23/2023 4+ Mucoid Pseudomonas aeruginosa (A)   07/23/2023 3+ Oropharyngeal Flora Isolated   07/23/2023 <1+ Smooth Pseudomonas aeruginosa (A)     WBC (10*9/L)   Date Value   07/23/2023 8.3          Other Labs:  Lab Results   Component Value Date    WBC 8.3 07/23/2023    HGB 13.8 07/23/2023    HCT 40.4 07/23/2023    PLT 200 07/23/2023     Lab Results   Component Value Date    NA 142 07/23/2023    K 4.1 07/23/2023    CL 102 07/23/2023    CO2 25.4 07/23/2023    BUN 27 (H) 07/23/2023    CREATININE 1.07 07/23/2023    GLU 114 07/23/2023    CALCIUM  10.0 07/23/2023    MG 1.5 (L) 12/22/2021    PHOS 3.7 01/22/2016     Lab Results   Component Value Date    BILITOT 0.2 (L) 07/23/2023    BILIDIR 0.40 04/02/2015    PROT 7.8 07/23/2023    ALBUMIN 3.7 07/23/2023    ALT 16 07/23/2023    AST 18 07/23/2023    ALKPHOS 96 07/23/2023    GGT 64 06/21/2013     Lab Results   Component Value Date    LABPROT 10.4 08/03/2013    INR 0.95 04/14/2017    APTT 32.6 07/25/2014       Allergies & Home Medications   Personally reviewed in Epic    Continuous Infusions:       Scheduled Medications:    albuterol   2.5 mg Nebulization 4x Daily (RT)    ascorbic acid  (vitamin C )  1,000 mg Oral Daily    atorvastatin   10 mg Oral Daily    azithromycin   250 mg Oral Daily    cefTAZidime   2 g Intravenous Q8H SCH    cholecalciferol  (vitamin D3 25 mcg (1,000 units))  50 mcg Oral Daily    colistimethate  (COLYMYCIN) 150 mg in sodium chloride  (NS) 4 mL inhalation  150 mg Inhalation Q12H [facility] dilTIAZem   120 mg Oral Daily    dornase alfa   2.5 mg Inhalation Daily (RT)    ferrous sulfate   325 mg Oral BID with meals    fluticasone  furoate-vilanterol  1 puff Inhalation Daily (RT)    And    umeclidinium  1 puff Inhalation Daily (RT)    HYDROcodone -acetaminophen   7.5 mg of hydrocodone  Oral TID    levoFLOXacin   750 mg Oral daily    montelukast   10 mg Oral Nightly    multivitamins (ADULT)  1 tablet Oral  Daily    pantoprazole   20 mg Oral Daily before breakfast    predniSONE   40 mg Oral Daily    sodium chloride   10 mL Intravenous Q8H    sodium chloride   5 mL Nebulization 4x Daily (RT)       PRN medications:  guaiFENesin , ondansetron  **OR** ondansetron 

## 2023-07-31 NOTE — Unmapped (Signed)
 Patient was administered all breathing treatments as scheduled and used chest vest and metaneb combined for airway clearance.  However, patient's cough has become unproductive.    Problem: Airway Clearance Ineffective  Goal: Effective Airway Clearance  Outcome: Ongoing - Unchanged

## 2023-07-31 NOTE — Unmapped (Signed)
 Pt in with bronchiectasis.  VSS, no c/o pain.  Receiving IV antibiotics, tolerating without difficulty.  Independent with adls.  Skin and falls protocol maintained.  Will continue to work towards prioritized goals.    Problem: Infection  Goal: Absence of Infection Signs and Symptoms  Outcome: Progressing  Intervention: Prevent or Manage Infection  Recent Flowsheet Documentation  Taken 07/31/2023 0800 by Barclay Leyden, RN  Infection Management: aseptic technique maintained  Isolation Precautions: contact precautions maintained     Problem: Airway Clearance Ineffective  Goal: Effective Airway Clearance  Outcome: Progressing     Problem: Fall Injury Risk  Goal: Absence of Fall and Fall-Related Injury  Outcome: Progressing  Intervention: Promote Injury-Free Environment  Recent Flowsheet Documentation  Taken 07/31/2023 0800 by Barclay Leyden, RN  Safety Interventions:   fall reduction program maintained   low bed   nonskid shoes/slippers when out of bed

## 2023-07-31 NOTE — Unmapped (Signed)
 A&O. VSS. No c/o pain at this time. Patient independent in room. Took a shower this shift. IV antibiotics continue without any adverse reactions noted. Plan of care is ongoing.    Problem: Adult Inpatient Plan of Care  Goal: Plan of Care Review  Outcome: Progressing  Goal: Patient-Specific Goal (Individualized)  Outcome: Progressing  Goal: Absence of Hospital-Acquired Illness or Injury  Outcome: Progressing  Intervention: Identify and Manage Fall Risk  Recent Flowsheet Documentation  Taken 07/31/2023 2000 by Armenta Bernheim, RN  Safety Interventions:   fall reduction program maintained   low bed   nonskid shoes/slippers when out of bed   isolation precautions  Intervention: Prevent Skin Injury  Recent Flowsheet Documentation  Taken 07/31/2023 2000 by Armenta Bernheim, RN  Positioning for Skin: Sitting in Chair  Goal: Optimal Comfort and Wellbeing  Outcome: Progressing  Goal: Readiness for Transition of Care  Outcome: Progressing  Goal: Rounds/Family Conference  Outcome: Progressing     Problem: Infection  Goal: Absence of Infection Signs and Symptoms  Outcome: Progressing  Intervention: Prevent or Manage Infection  Recent Flowsheet Documentation  Taken 07/31/2023 2000 by Armenta Bernheim, RN  Isolation Precautions: contact precautions maintained     Problem: Fall Injury Risk  Goal: Absence of Fall and Fall-Related Injury  Outcome: Progressing  Intervention: Promote Injury-Free Environment  Recent Flowsheet Documentation  Taken 07/31/2023 2000 by Armenta Bernheim, RN  Safety Interventions:   fall reduction program maintained   low bed   nonskid shoes/slippers when out of bed   isolation precautions

## 2023-08-01 MED ADMIN — albuterol 2.5 mg /3 mL (0.083 %) nebulizer solution 2.5 mg: 2.5 mg | RESPIRATORY_TRACT | @ 12:00:00

## 2023-08-01 MED ADMIN — pantoprazole (Protonix) EC tablet 20 mg: 20 mg | ORAL | @ 12:00:00

## 2023-08-01 MED ADMIN — fluticasone furoate-vilanterol (BREO ELLIPTA) 100-25 mcg/dose inhaler 1 puff: 1 | RESPIRATORY_TRACT | @ 12:00:00

## 2023-08-01 MED ADMIN — colistimethate (COLYMYCIN) 150 mg in sodium chloride (NS) 4 mL inhalation: 150 mg | RESPIRATORY_TRACT | @ 12:00:00

## 2023-08-01 MED ADMIN — albuterol 2.5 mg /3 mL (0.083 %) nebulizer solution 2.5 mg: 2.5 mg | RESPIRATORY_TRACT | @ 01:00:00

## 2023-08-01 MED ADMIN — colistimethate (COLYMYCIN) 150 mg in sodium chloride (NS) 4 mL inhalation: 150 mg | RESPIRATORY_TRACT | @ 01:00:00

## 2023-08-01 MED ADMIN — azithromycin (ZITHROMAX) tablet 250 mg: 250 mg | ORAL | @ 12:00:00 | Stop: 2023-08-06

## 2023-08-01 MED ADMIN — sodium chloride 10 % NEBULIZER solution 5 mL: 5 mL | RESPIRATORY_TRACT | @ 15:00:00

## 2023-08-01 MED ADMIN — dornase alfa (PULMOZYME) 1 mg/mL solution 2.5 mg: 2.5 mg | RESPIRATORY_TRACT | @ 12:00:00

## 2023-08-01 MED ADMIN — cholecalciferol (vitamin D3 25 mcg (1,000 units)) tablet 50 mcg: 50 ug | ORAL | @ 12:00:00

## 2023-08-01 MED ADMIN — dilTIAZem (CARDIZEM CD) 24 hr capsule 120 mg: 120 mg | ORAL | @ 12:00:00

## 2023-08-01 MED ADMIN — sodium chloride (NS) 0.9 % flush 10 mL: 10 mL | INTRAVENOUS | @ 09:00:00

## 2023-08-01 MED ADMIN — sodium chloride 10 % NEBULIZER solution 5 mL: 5 mL | RESPIRATORY_TRACT | @ 01:00:00

## 2023-08-01 MED ADMIN — cefTAZidime (FORTAZ) 2 g in sodium chloride 0.9 % (NS) 100 mL IVPB-MBP: 2 g | INTRAVENOUS | @ 09:00:00 | Stop: 2023-08-06

## 2023-08-01 MED ADMIN — albuterol 2.5 mg /3 mL (0.083 %) nebulizer solution 2.5 mg: 2.5 mg | RESPIRATORY_TRACT | @ 15:00:00

## 2023-08-01 MED ADMIN — cefTAZidime (FORTAZ) 2 g in sodium chloride 0.9 % (NS) 100 mL IVPB-MBP: 2 g | INTRAVENOUS | @ 01:00:00 | Stop: 2023-08-06

## 2023-08-01 MED ADMIN — HYDROcodone-acetaminophen (HYCET) 7.5-325 mg/15 mL solution 7.5 mg of hydrocodone: 7.5 mg | ORAL | @ 09:00:00 | Stop: 2023-08-06

## 2023-08-01 MED ADMIN — atorvastatin (LIPITOR) tablet 10 mg: 10 mg | ORAL | @ 01:00:00

## 2023-08-01 MED ADMIN — albuterol 2.5 mg /3 mL (0.083 %) nebulizer solution 2.5 mg: 2.5 mg | RESPIRATORY_TRACT | @ 19:00:00

## 2023-08-01 MED ADMIN — multivitamins, therapeutic with minerals tablet 1 tablet: 1 | ORAL | @ 12:00:00

## 2023-08-01 MED ADMIN — HYDROcodone-acetaminophen (HYCET) 7.5-325 mg/15 mL solution 7.5 mg of hydrocodone: 7.5 mg | ORAL | @ 17:00:00 | Stop: 2023-08-06

## 2023-08-01 MED ADMIN — sodium chloride 10 % NEBULIZER solution 5 mL: 5 mL | RESPIRATORY_TRACT | @ 12:00:00

## 2023-08-01 MED ADMIN — ascorbic acid (vitamin C) (VITAMIN C) tablet 1,000 mg: 1000 mg | ORAL | @ 12:00:00

## 2023-08-01 MED ADMIN — sodium chloride (NS) 0.9 % flush 10 mL: 10 mL | INTRAVENOUS | @ 01:00:00

## 2023-08-01 MED ADMIN — sodium chloride 10 % NEBULIZER solution 5 mL: 5 mL | RESPIRATORY_TRACT | @ 19:00:00

## 2023-08-01 MED ADMIN — sodium chloride (NS) 0.9 % flush 10 mL: 10 mL | INTRAVENOUS | @ 17:00:00

## 2023-08-01 MED ADMIN — levoFLOXacin (LEVAQUIN) tablet 750 mg: 750 mg | ORAL | @ 09:00:00 | Stop: 2023-08-06

## 2023-08-01 MED ADMIN — predniSONE (DELTASONE) tablet 40 mg: 40 mg | ORAL | @ 12:00:00 | Stop: 2023-08-01

## 2023-08-01 MED ADMIN — ferrous sulfate tablet 325 mg: 325 mg | ORAL | @ 22:00:00

## 2023-08-01 MED ADMIN — montelukast (SINGULAIR) tablet 10 mg: 10 mg | ORAL | @ 01:00:00

## 2023-08-01 MED ADMIN — HYDROcodone-acetaminophen (HYCET) 7.5-325 mg/15 mL solution 7.5 mg of hydrocodone: 7.5 mg | ORAL | @ 22:00:00 | Stop: 2023-08-06

## 2023-08-01 MED ADMIN — ferrous sulfate tablet 325 mg: 325 mg | ORAL | @ 12:00:00

## 2023-08-01 MED ADMIN — cefTAZidime (FORTAZ) 2 g in sodium chloride 0.9 % (NS) 100 mL IVPB-MBP: 2 g | INTRAVENOUS | @ 17:00:00 | Stop: 2023-08-06

## 2023-08-01 MED ADMIN — umeclidinium (INCRUSE ELLIPTA) 62.5 mcg/actuation inhaler 1 puff: 1 | RESPIRATORY_TRACT | @ 12:00:00

## 2023-08-01 NOTE — Unmapped (Addendum)
 Daily Progress Note    Assessment/Plan:    Principal Problem:    Bronchiectasis    Active Problems:    Essential hypertension    Gastroesophageal reflux disease without esophagitis    Acute on chronic respiratory failure with hypoxemia      Sensory neuropathy                 Jordan [name] is a 68 y.o. male who is presenting to [facility] with Bronchiectasis, in the setting of the following pertinent/contributing co-morbidities: idiopathic bronchiectasis with chronic pseudomonas colonization, right middle lobe lobectomy and right lower lobe wedge resection chronic hypoxemia on 2 liters of O2 at home, HTN, chronic peripheral neuropathy/lumbosacral radiculopathy on chronic norco  at home, osteoporosis on yearly zoledronic  acid .     1 - bronchiectasis with exacerbation:  continue IV ceftazidime , po levaquin , inhaled colistin.  His sputum cx is c/w with pseudomonas (chronically colonized).  Airway clearance with metaneb/volera, hypertonic saline 10% nebs with QID with albuterol  pre-treatment.  Continue dornase and formulary equivalent of home inhalers.  Continue montelukast  and chronic azithromycin .  Prednisone  dose reduced again to 30 mg po daily.  D/w pulm and suspect the decrease in coughing is actually clinical improvement.  This is supported by reduction in his o2 requirements (was on 6 liters when he arrived, now on 4 liters).  He uses 2 liters [place] at home.      2 - asthma with allergic component:  on dupixent  at home, pulmonology working on approval for [facility]  outpatient.      3 - paroxysmal SVT:  follows with [facility] cardiology, clinically stable on diltiazem .      4 - Chronic pain - due to lumbosacral radiculopathy - on home norco .      5 - osteoporosis:  on yearly zoledronic  acid with last dose 02/2023.      DVT PPX:  ambulatory     Disposition:  hopefully dc home 5/12 vs 5/13.   ___________________________________________________________________    Subjective:  Patient states he still isn't at his baseline.  Said he feels he will feel better the more he coughs and at prednisone  30 mg, his coughing was more frequent which he feels helped him clear his secretions.  Otherwise, on 4 liters [place] at rest.     No results found for this or any previous visit (from the past 24 hours).  Labs/Studies:  Labs and Studies from the last 24hrs per EMR and Reviewed    Objective:  Temp:  [36.4 ??C (97.5 ??F)-36.5 ??C (97.7 ??F)] 36.5 ??C (97.7 ??F)  Pulse:  [94-98] 95  Resp:  [18] 18  BP: (134-143)/(68-82) 143/68  SpO2:  [95 %-99 %] 98 %    General - seated in chair, awake, no apparent distress  CV - RRR, no murmurs   Pulm - faint rhonci b/l, could not appreciate wheezing

## 2023-08-01 NOTE — Unmapped (Signed)
 Pt A&O, RA, VSS. No complaints of pain this shift. Pt is compliant with today's POC and med admin. IV abx infused without issue. Scheduled pain meds given per MAR. Pt up to chair this shift. Call bell within reach. Pt instructed to call for assist as needed.    Problem: Adult Inpatient Plan of Care  Goal: Absence of Hospital-Acquired Illness or Injury  Outcome: Progressing  Intervention: Identify and Manage Fall Risk  Recent Flowsheet Documentation  Taken 08/01/2023 1200 by Cloyde Daring, RN  Safety Interventions:   fall reduction program maintained   low bed   nonskid shoes/slippers when out of bed  Taken 08/01/2023 0800 by Theseus Birnie R, RN  Safety Interventions: fall reduction program maintained  Intervention: Prevent Skin Injury  Recent Flowsheet Documentation  Taken 08/01/2023 1200 by Cloyde Daring, RN  Positioning for Skin:   Supine/Back   Sitting in Chair  Taken 08/01/2023 1000 by Cloyde Daring, RN  Positioning for Skin:   Supine/Back   Sitting in Chair  Taken 08/01/2023 0800 by Cloyde Daring, RN  Positioning for Skin:   Supine/Back   Sitting in Chair

## 2023-08-01 NOTE — Unmapped (Signed)
 General Pulmonary Team Follow Up Consult Note     Date of Service: 08/01/2023  Requesting Physician: Emogene Harpin, MD   Requesting Service: Med Undesignated (MDX)  Reason for consultation: Comprehensive evaluation of bronchiectasis.    Hospital Problems:  Principal Problem:    Bronchiectasis    Active Problems:    Essential hypertension    Gastroesophageal reflux disease without esophagitis    Acute on chronic respiratory failure with hypoxemia      Sensory neuropathy      HPI: Jordan [name] is a 68 y.o. male with bronchiectasis, asthma/COPD overlap who presents for bronchiectasis exacerbation.     Problems addressed during this consult include bronchiectasis. Based on these problems, the patient has moderate risk of morbidity/mortality which is commensurate w their risk of management options described below in the recommendations.    Assessment      Interval Events:   -feels there's mucus he can't bring up  -tolerating therapies well  -on 10% NS and not expectorating much       Impression: The patient has made significant clinical progress since last encounter. I personally reviewed most recent pertinent labs, imaging and micro data, from 08/01/2023, which are noted in recommendations..        Recommendations     Antibiotics:  Based on prior culture data (OPF, PsA) and allergy profile, favor treating with:  IV ceftazidime   Oral levofloxacin   Inhaled colistin  LRCX +pseudomonas  S/p PICC line placement for IV antibiotics  Continue chronic azithromycin  for anti-inflammatory effect.     Other medications:  Prednisone  40 mg daily x 5 days. Taper by 10 mg every 5 days. (He expresses desire to drop pred to 30mg  daily to see if it helps with mucus expectoration. This seems reasonable to try.)  Convert Trelegy to formulary equivalent.  May change to Trelegy 200 after discharge.  COVID vaccine prior to discharge     Airway clearance: Needs aggressive airway clearance 4 times per day.  Nebulized hypertonic saline 10%; pre-treatment with albuterol .  For mechanical clearance, prefer metaneb and Vibralung.  Pulmozyme  2.5 mg nebulized daily       Labs:  On admission, CBC-D, CMP, CRP, ESR.  Sputum for bacterial and AFB cultures (NTM screening) sent from clinic.   CRP and ESR every 4-5 days to trend (including as outpatient).  [facility] Guideline for Monitoring Outpatient Intravenous and Oral Antimicrobial Therapy recommended monitoring labs ([email or web address])     Imaging:  None     Other Testing:  Patient may need another FVL performed while in the hospital depending on how he feels on Monday. If continuing IV antibiotics, would obtain prior to leaving hospital.     [facility]):  Patient is not a candidate for [facility] due to distance.     Dispo:   Patient will need follow-up appointment with bronchiectasis clinic in 2-3 months with FVL.  Patient does not need to remain in the hospital for the entire course of IV antibiotics.   Please reach out to [facility] Bronchiectasis/[facility] team Leota Randy RN, [place] Levels CPP) prior to discharge to ensure that home infusion orders are correct and that we are comfortable with patient discharging home on IVs at that time.  We will follow monitoring labs.  Please use DOTPULMIVDISCHARGE for home infusion orders. We will follow monitoring labs. Does not need OPAT.  Do not discharge Friday through Sunday or on a holiday as no outpatient clinical staff available  to assist if problems arise with home health/home infusion.       Donzetta Galli, MD    Subjective & Objective     Vitals - past 24 hours  Temp:  [36.2 ??C (97.2 ??F)-36.4 ??C (97.5 ??F)] 36.4 ??C (97.5 ??F)  Pulse:  [80-98] 97  Resp:  [18] 18  BP: (134-137)/(76-83) 137/82  SpO2:  [95 %-100 %] 96 % Intake/Output  I/O last 3 completed shifts:  In: 600 [P.O.:600]  Out: -       Pertinent exam findings:   General appearance - well appearing, well nourished, and in no distress  Heart - regular rate and rhythm, normal S1/S2, no gallops, rubs, or murmurs  Chest - stable rhonchi, slightly worse on L today  Neuro: AAOx3  ENT: MMM, wearing nasal cannula      Relevant Imaging:  - CXR on 07/23/23 revealed Right apical pleural thickening with scarring and volume loss in the right lung apex. Elevated right hemidiaphragm. Extensive bronchiectasis and bronchial wall thickening bilaterally.     Cultures:  Lower Respiratory Culture (no units)   Date Value   07/23/2023 4+ Mucoid Pseudomonas aeruginosa (A)   07/23/2023 3+ Oropharyngeal Flora Isolated   07/23/2023 <1+ Smooth Pseudomonas aeruginosa (A)     WBC (10*9/L)   Date Value   07/23/2023 8.3          Other Labs:  Lab Results   Component Value Date    WBC 8.3 07/23/2023    HGB 13.8 07/23/2023    HCT 40.4 07/23/2023    PLT 200 07/23/2023     Lab Results   Component Value Date    NA 142 07/23/2023    K 4.1 07/23/2023    CL 102 07/23/2023    CO2 25.4 07/23/2023    BUN 27 (H) 07/23/2023    CREATININE 1.07 07/23/2023    GLU 114 07/23/2023    CALCIUM  10.0 07/23/2023    MG 1.5 (L) 12/22/2021    PHOS 3.7 01/22/2016     Lab Results   Component Value Date    BILITOT 0.2 (L) 07/23/2023    BILIDIR 0.40 04/02/2015    PROT 7.8 07/23/2023    ALBUMIN 3.7 07/23/2023    ALT 16 07/23/2023    AST 18 07/23/2023    ALKPHOS 96 07/23/2023    GGT 64 06/21/2013     Lab Results   Component Value Date    LABPROT 10.4 08/03/2013    INR 0.95 04/14/2017    APTT 32.6 07/25/2014       Allergies & Home Medications   Personally reviewed in Epic    Continuous Infusions:       Scheduled Medications:    albuterol   2.5 mg Nebulization 4x Daily (RT)    ascorbic acid  (vitamin C )  1,000 mg Oral Daily    atorvastatin   10 mg Oral Daily    azithromycin   250 mg Oral Daily    cefTAZidime   2 g Intravenous Q8H SCH cholecalciferol  (vitamin D3 25 mcg (1,000 units))  50 mcg Oral Daily    colistimethate  (COLYMYCIN) 150 mg in sodium chloride  (NS) 4 mL inhalation  150 mg Inhalation Q12H [facility]    dilTIAZem   120 mg Oral Daily    dornase alfa   2.5 mg Inhalation Daily (RT)    ferrous sulfate   325 mg Oral BID with meals    fluticasone  furoate-vilanterol  1 puff Inhalation Daily (RT)    And    umeclidinium  1 puff Inhalation  Daily (RT)    HYDROcodone -acetaminophen   7.5 mg of hydrocodone  Oral TID    levoFLOXacin   750 mg Oral daily    montelukast   10 mg Oral Nightly    multivitamins (ADULT)  1 tablet Oral Daily    pantoprazole   20 mg Oral Daily before breakfast    predniSONE   40 mg Oral Daily    sodium chloride   10 mL Intravenous Q8H    sodium chloride   5 mL Nebulization 4x Daily (RT)       PRN medications:  guaiFENesin , ondansetron  **OR** ondansetron 

## 2023-08-01 NOTE — Unmapped (Signed)
 Problem: Airway Clearance Ineffective  Goal: Effective Airway Clearance  Outcome: Progressing   Received and remains on 4L [place], patient was compliant with all breathing treatments as scheduled and used chest vest and metaneb for airway clearance. Pt reports NPC, no complications to therapy.

## 2023-08-01 NOTE — Unmapped (Signed)
 Patient rested on room air. All scheduled breathing treatments given. Chest vest and metaneb done for airway clearance. Non productive coughing noted after tx

## 2023-08-02 LAB — CBC W/ AUTO DIFF
BASOPHILS ABSOLUTE COUNT: 0 10*9/L (ref 0.0–0.1)
BASOPHILS RELATIVE PERCENT: 0.2 %
EOSINOPHILS ABSOLUTE COUNT: 0.1 10*9/L (ref 0.0–0.5)
EOSINOPHILS RELATIVE PERCENT: 0.6 %
HEMATOCRIT: 38.3 % — ABNORMAL LOW (ref 39.0–48.0)
HEMOGLOBIN: 12.9 g/dL (ref 12.9–16.5)
LYMPHOCYTES ABSOLUTE COUNT: 2.3 10*9/L (ref 1.1–3.6)
LYMPHOCYTES RELATIVE PERCENT: 15.6 %
MEAN CORPUSCULAR HEMOGLOBIN CONC: 33.8 g/dL (ref 32.0–36.0)
MEAN CORPUSCULAR HEMOGLOBIN: 30.7 pg (ref 25.9–32.4)
MEAN CORPUSCULAR VOLUME: 90.9 fL (ref 77.6–95.7)
MEAN PLATELET VOLUME: 8.3 fL (ref 6.8–10.7)
MONOCYTES ABSOLUTE COUNT: 1.4 10*9/L — ABNORMAL HIGH (ref 0.3–0.8)
MONOCYTES RELATIVE PERCENT: 9.4 %
NEUTROPHILS ABSOLUTE COUNT: 10.8 10*9/L — ABNORMAL HIGH (ref 1.8–7.8)
NEUTROPHILS RELATIVE PERCENT: 74.2 %
NUCLEATED RED BLOOD CELLS: 0 /100{WBCs} (ref <=4)
PLATELET COUNT: 174 10*9/L (ref 150–450)
RED BLOOD CELL COUNT: 4.22 10*12/L — ABNORMAL LOW (ref 4.26–5.60)
RED CELL DISTRIBUTION WIDTH: 12.8 % (ref 12.2–15.2)
WBC ADJUSTED: 14.6 10*9/L — ABNORMAL HIGH (ref 3.6–11.2)

## 2023-08-02 LAB — BASIC METABOLIC PANEL
ANION GAP: 12 mmol/L (ref 5–14)
BLOOD UREA NITROGEN: 32 mg/dL — ABNORMAL HIGH (ref 9–23)
BUN / CREAT RATIO: 33
CALCIUM: 9.9 mg/dL (ref 8.7–10.4)
CHLORIDE: 97 mmol/L — ABNORMAL LOW (ref 98–107)
CO2: 35.1 mmol/L — ABNORMAL HIGH (ref 20.0–31.0)
CREATININE: 0.98 mg/dL (ref 0.73–1.18)
EGFR CKD-EPI (2021) MALE: 83 mL/min/1.73m2 (ref >=60)
GLUCOSE RANDOM: 86 mg/dL (ref 70–179)
POTASSIUM: 4.1 mmol/L (ref 3.4–4.8)
SODIUM: 144 mmol/L (ref 135–145)

## 2023-08-02 MED ADMIN — HYDROcodone-acetaminophen (HYCET) 7.5-325 mg/15 mL solution 7.5 mg of hydrocodone: 7.5 mg | ORAL | @ 22:00:00 | Stop: 2023-08-06

## 2023-08-02 MED ADMIN — ferrous sulfate tablet 325 mg: 325 mg | ORAL | @ 22:00:00

## 2023-08-02 MED ADMIN — albuterol 2.5 mg /3 mL (0.083 %) nebulizer solution 2.5 mg: 2.5 mg | RESPIRATORY_TRACT | @ 21:00:00

## 2023-08-02 MED ADMIN — atorvastatin (LIPITOR) tablet 10 mg: 10 mg | ORAL | @ 01:00:00

## 2023-08-02 MED ADMIN — ascorbic acid (vitamin C) (VITAMIN C) tablet 1,000 mg: 1000 mg | ORAL | @ 13:00:00

## 2023-08-02 MED ADMIN — pantoprazole (Protonix) EC tablet 20 mg: 20 mg | ORAL | @ 13:00:00

## 2023-08-02 MED ADMIN — dilTIAZem (CARDIZEM CD) 24 hr capsule 120 mg: 120 mg | ORAL | @ 14:00:00

## 2023-08-02 MED ADMIN — sodium chloride 10 % NEBULIZER solution 5 mL: 5 mL | RESPIRATORY_TRACT | @ 13:00:00

## 2023-08-02 MED ADMIN — albuterol 2.5 mg /3 mL (0.083 %) nebulizer solution 2.5 mg: 2.5 mg | RESPIRATORY_TRACT

## 2023-08-02 MED ADMIN — cefTAZidime (FORTAZ) 2 g in sodium chloride 0.9 % (NS) 100 mL IVPB-MBP: 2 g | INTRAVENOUS | @ 03:00:00 | Stop: 2023-08-06

## 2023-08-02 MED ADMIN — levoFLOXacin (LEVAQUIN) tablet 750 mg: 750 mg | ORAL | @ 10:00:00 | Stop: 2023-08-06

## 2023-08-02 MED ADMIN — cefTAZidime (FORTAZ) 2 g in sodium chloride 0.9 % (NS) 100 mL IVPB-MBP: 2 g | INTRAVENOUS | @ 10:00:00 | Stop: 2023-08-06

## 2023-08-02 MED ADMIN — sodium chloride 10 % NEBULIZER solution 5 mL: 5 mL | RESPIRATORY_TRACT | @ 21:00:00

## 2023-08-02 MED ADMIN — azithromycin (ZITHROMAX) tablet 250 mg: 250 mg | ORAL | @ 13:00:00 | Stop: 2023-08-06

## 2023-08-02 MED ADMIN — colistimethate (COLYMYCIN) 150 mg in sodium chloride (NS) 4 mL inhalation: 150 mg | RESPIRATORY_TRACT | @ 13:00:00

## 2023-08-02 MED ADMIN — colistimethate (COLYMYCIN) 150 mg in sodium chloride (NS) 4 mL inhalation: 150 mg | RESPIRATORY_TRACT

## 2023-08-02 MED ADMIN — HYDROcodone-acetaminophen (HYCET) 7.5-325 mg/15 mL solution 7.5 mg of hydrocodone: 7.5 mg | ORAL | @ 10:00:00 | Stop: 2023-08-06

## 2023-08-02 MED ADMIN — multivitamins, therapeutic with minerals tablet 1 tablet: 1 | ORAL | @ 13:00:00

## 2023-08-02 MED ADMIN — montelukast (SINGULAIR) tablet 10 mg: 10 mg | ORAL | @ 01:00:00

## 2023-08-02 MED ADMIN — sodium chloride (NS) 0.9 % flush 10 mL: 10 mL | INTRAVENOUS | @ 16:00:00

## 2023-08-02 MED ADMIN — dornase alfa (PULMOZYME) 1 mg/mL solution 2.5 mg: 2.5 mg | RESPIRATORY_TRACT | @ 13:00:00

## 2023-08-02 MED ADMIN — ferrous sulfate tablet 325 mg: 325 mg | ORAL | @ 13:00:00

## 2023-08-02 MED ADMIN — sodium chloride 10 % NEBULIZER solution 5 mL: 5 mL | RESPIRATORY_TRACT | @ 17:00:00

## 2023-08-02 MED ADMIN — HYDROcodone-acetaminophen (HYCET) 7.5-325 mg/15 mL solution 7.5 mg of hydrocodone: 7.5 mg | ORAL | @ 16:00:00 | Stop: 2023-08-06

## 2023-08-02 MED ADMIN — fluticasone furoate-vilanterol (BREO ELLIPTA) 100-25 mcg/dose inhaler 1 puff: 1 | RESPIRATORY_TRACT | @ 13:00:00

## 2023-08-02 MED ADMIN — sodium chloride 10 % NEBULIZER solution 5 mL: 5 mL | RESPIRATORY_TRACT

## 2023-08-02 MED ADMIN — albuterol 2.5 mg /3 mL (0.083 %) nebulizer solution 2.5 mg: 2.5 mg | RESPIRATORY_TRACT | @ 13:00:00

## 2023-08-02 MED ADMIN — cefTAZidime (FORTAZ) 2 g in sodium chloride 0.9 % (NS) 100 mL IVPB-MBP: 2 g | INTRAVENOUS | @ 19:00:00 | Stop: 2023-08-06

## 2023-08-02 MED ADMIN — cholecalciferol (vitamin D3 25 mcg (1,000 units)) tablet 50 mcg: 50 ug | ORAL | @ 13:00:00

## 2023-08-02 MED ADMIN — sodium chloride (NS) 0.9 % flush 10 mL: 10 mL | INTRAVENOUS | @ 08:00:00

## 2023-08-02 MED ADMIN — umeclidinium (INCRUSE ELLIPTA) 62.5 mcg/actuation inhaler 1 puff: 1 | RESPIRATORY_TRACT | @ 13:00:00

## 2023-08-02 MED ADMIN — sodium chloride (NS) 0.9 % flush 10 mL: 10 mL | INTRAVENOUS | @ 01:00:00

## 2023-08-02 MED ADMIN — albuterol 2.5 mg /3 mL (0.083 %) nebulizer solution 2.5 mg: 2.5 mg | RESPIRATORY_TRACT | @ 17:00:00

## 2023-08-02 MED ADMIN — predniSONE (DELTASONE) tablet 30 mg: 30 mg | ORAL | @ 13:00:00

## 2023-08-02 NOTE — Unmapped (Signed)
 General Pulmonary Team Follow Up Consult Note     Date of Service: 08/02/2023  Requesting Physician: Ejaz Arshad Janjua, DO   Requesting Service: Med Undesignated (MDX)  Reason for consultation: Comprehensive evaluation of bronchiectasis.    Hospital Problems:  Principal Problem:    Bronchiectasis    Active Problems:    Essential hypertension    Gastroesophageal reflux disease without esophagitis    Acute on chronic respiratory failure with hypoxemia      Sensory neuropathy      HPI: Jordan [name] is a 68 y.o. male with bronchiectasis, asthma/COPD overlap who presents for bronchiectasis exacerbation. Plan to complete abx while inpatient (due to coverage).     Problems addressed during this consult include bronchiectasis. Based on these problems, the patient has moderate risk of morbidity/mortality which is commensurate w their risk of management options described below in the recommendations.    Assessment      Interval Events:   -overall feels very similar than before, slowly improving  -has minimal mucus production       Impression: The patient has made significant clinical progress since last encounter. I personally reviewed most recent pertinent labs, imaging and micro data, from 08/02/2023, which are noted in recommendations..        Recommendations     Antibiotics:  Based on prior culture data (OPF, PsA) and allergy profile, favor treating with:  IV ceftazidime   Oral levofloxacin   Inhaled colistin  LRCX +pseudomonas  S/p PICC line placement for IV antibiotics  Anticipated ED of abx is 08/06/23 - plan to complete inpatient due to coverage issues  Continue chronic azithromycin  for anti-inflammatory effect.     Other medications:  Prednisone  40 mg daily x 5 days. Taper by 10 mg every 5 days. Have decreased to prednisone  30 mg since patient wou  Convert Trelegy to formulary equivalent.  May change to Trelegy 200 after discharge.  COVID vaccine prior to discharge     Airway clearance: Needs aggressive airway clearance 4 times per day.  Nebulized hypertonic saline 10%; pre-treatment with albuterol .  For mechanical clearance, prefer metaneb and Vibralung.  Pulmozyme  2.5 mg nebulized daily       Labs:  On admission, CBC-D, CMP, CRP, ESR.  Sputum for bacterial and AFB cultures (NTM screening) sent from clinic.   CRP and ESR every 4-5 days to trend (including as outpatient).  [facility] Guideline for Monitoring Outpatient Intravenous and Oral Antimicrobial Therapy recommended monitoring labs ([email or web address])     Imaging:  None     Other Testing:  Would benefit from repeat PFTs with another FVL performed while in the hospital.     [facility]):  Patient is not a candidate for [facility] due to distance.     Dispo:   Patient will need follow-up appointment with bronchiectasis clinic in 2-3 months with FVL.  Patient does not need to remain in the hospital for the entire course of IV antibiotics.   Please reach out to [facility] Bronchiectasis/NTM Care [facility] team Leota Randy RN, [place] Levels CPP) prior to discharge to ensure that home infusion orders are correct and that we are comfortable with patient discharging home on IVs at that time.  We will follow monitoring labs.  Please use DOTPULMIVDISCHARGE for home infusion orders. We will follow monitoring labs. Does not need OPAT.  Do not discharge Friday through Sunday or on a holiday as no outpatient clinical staff available to assist if problems arise with home health/home  infusion.       Quinisha Mould, MD    Subjective & Objective     Vitals - past 24 hours  Temp:  [35.8 ??C (96.5 ??F)-36.7 ??C (98.1 ??F)] 35.8 ??C (96.5 ??F)  Pulse:  [95-98] 98  Resp:  [17-18] 18  BP: (128-142)/(71-75) 142/74  SpO2:  [93 %-99 %] 93 % Intake/Output  I/O last 3 completed shifts:  In: 20 [I.V.:20]  Out: -       Pertinent exam findings:   General appearance - well appearing, well nourished, and in no distress  Heart - regular rate and rhythm, normal S1/S2, no gallops, rubs, or murmurs  Chest - stable rhonchi  Neuro: AAOx3  ENT: MMM, wearing nasal cannula  LE: 2+ LE edema, L>R       Relevant Imaging:  - CXR on 07/23/23 revealed Right apical pleural thickening with scarring and volume loss in the right lung apex. Elevated right hemidiaphragm. Extensive bronchiectasis and bronchial wall thickening bilaterally.     Cultures:  Lower Respiratory Culture (no units)   Date Value   07/23/2023 4+ Mucoid Pseudomonas aeruginosa (A)   07/23/2023 3+ Oropharyngeal Flora Isolated   07/23/2023 <1+ Smooth Pseudomonas aeruginosa (A)     WBC (10*9/L)   Date Value   08/02/2023 14.6 (H)          Other Labs:  Lab Results   Component Value Date    WBC 14.6 (H) 08/02/2023    HGB 12.9 08/02/2023    HCT 38.3 (L) 08/02/2023    PLT 174 08/02/2023     Lab Results   Component Value Date    NA 144 08/02/2023    K 4.1 08/02/2023    CL 97 (L) 08/02/2023    CO2 35.1 (H) 08/02/2023    BUN 32 (H) 08/02/2023    CREATININE 0.98 08/02/2023    GLU 86 08/02/2023    CALCIUM  9.9 08/02/2023    MG 1.5 (L) 12/22/2021    PHOS 3.7 01/22/2016     Lab Results   Component Value Date    BILITOT 0.2 (L) 07/23/2023    BILIDIR 0.40 04/02/2015    PROT 7.8 07/23/2023    ALBUMIN 3.7 07/23/2023    ALT 16 07/23/2023    AST 18 07/23/2023    ALKPHOS 96 07/23/2023    GGT 64 06/21/2013     Lab Results   Component Value Date    LABPROT 10.4 08/03/2013    INR 0.95 04/14/2017    APTT 32.6 07/25/2014       Allergies & Home Medications   Personally reviewed in Epic    Continuous Infusions:       Scheduled Medications:    albuterol   2.5 mg Nebulization 4x Daily (RT)    ascorbic acid  (vitamin C )  1,000 mg Oral Daily    atorvastatin   10 mg Oral Daily    azithromycin   250 mg Oral Daily    cefTAZidime   2 g Intravenous Q8H SCH    cholecalciferol  (vitamin D3 25 mcg (1,000 units))  50 mcg Oral Daily    colistimethate  (COLYMYCIN) 150 mg in sodium chloride  (NS) 4 mL inhalation  150 mg Inhalation Q12H SCH    dilTIAZem   120 mg Oral Daily    dornase alfa   2.5 mg Inhalation Daily (RT)    ferrous sulfate   325 mg Oral BID with meals    fluticasone  furoate-vilanterol  1 puff Inhalation Daily (RT)    And    umeclidinium  1 puff  Inhalation Daily (RT)    HYDROcodone -acetaminophen   7.5 mg of hydrocodone  Oral TID    levoFLOXacin   750 mg Oral daily    montelukast   10 mg Oral Nightly    multivitamins (ADULT)  1 tablet Oral Daily    pantoprazole   20 mg Oral Daily before breakfast    predniSONE   30 mg Oral Daily    sodium chloride   10 mL Intravenous Q8H    sodium chloride   5 mL Nebulization 4x Daily (RT)       PRN medications:  guaiFENesin , ondansetron  **OR** ondansetron 

## 2023-08-02 NOTE — Unmapped (Addendum)
 Daily Progress Note    Assessment/Plan:    Principal Problem:    Bronchiectasis    Active Problems:    Essential hypertension    Gastroesophageal reflux disease without esophagitis    Acute on chronic respiratory failure with hypoxemia      Sensory neuropathy                 Jordan [name] is a 68 y.o. male who is presenting to [facility] with Bronchiectasis, in the setting of the following pertinent/contributing co-morbidities: idiopathic bronchiectasis with chronic pseudomonas colonization, right middle lobe lobectomy and right lower lobe wedge resection chronic hypoxemia on 2 liters of O2 at home, HTN, chronic peripheral neuropathy/lumbosacral radiculopathy on chronic norco  at home, osteoporosis on yearly zoledronic  acid .     1 - bronchiectasis with exacerbation:  continue IV ceftazidime , po levaquin , inhaled colistin.  His sputum cx is c/w with pseudomonas (chronically colonized).  Airway clearance with metaneb/volera, hypertonic saline 10% nebs with QID with albuterol  pre-treatment.  Continue dornase and formulary equivalent of home inhalers.  Continue montelukast  and chronic azithromycin .  Prednisone  dose reduced again to 30 mg po daily yesterday.  D/w pulm and suspect the decrease in coughing is actually clinical improvement.  This is supported by reduction in his o2 requirements (was on 6 liters when he arrived, now on 4 liters).  He uses 2 liters [place] at home but has reportedly told others it is 2 to 4 liters.  He needs antibiotics through 5/16 and cost is prohibiting home infusions at this time.      2 - asthma with allergic component:  on dupixent  at home, pulmonology working on approval for [facility]  outpatient.      3 - paroxysmal SVT:  follows with [facility] cardiology, clinically stable on diltiazem .      4 - Chronic pain - due to lumbosacral radiculopathy - on home norco .      5 - osteoporosis:  on yearly zoledronic  acid with last dose 02/2023.      6 - leukocytosis - no increase in O2 requirements, no new sources of infection clinically, no evidence of a DVT anywhere - suspect this is all driven by prednisone .      DVT PPX:  ambulatory     Disposition:  home hopefully 5/16 (can move up timing of last dose of ceftazidime  to facilitate a 5/16 evening discharge).     ___________________________________________________________________    Subjective:  No acute events overnight.      Recent Results (from the past 24 hours)   Basic Metabolic Panel    Collection Time: 08/02/23  6:11 AM   Result Value Ref Range    Sodium 144 135 - 145 mmol/L    Potassium 4.1 3.4 - 4.8 mmol/L    Chloride 97 (L) 98 - 107 mmol/L    CO2 35.1 (H) 20.0 - 31.0 mmol/L    Anion Gap 12 5 - 14 mmol/L    BUN 32 (H) 9 - 23 mg/dL    Creatinine 0.98 1.19 - 1.18 mg/dL    BUN/Creatinine Ratio 33     eGFR CKD-EPI (2021) Male 83 >=60 mL/min/1.33m2    Glucose 86 70 - 179 mg/dL    Calcium  9.9 8.7 - 10.4 mg/dL   CBC w/ Differential    Collection Time: 08/02/23  6:11 AM   Result Value Ref Range    WBC 14.6 (H) 3.6 - 11.2 10*9/L    RBC 4.22 (L) 4.26 - 5.60 10*12/L  HGB 12.9 12.9 - 16.5 g/dL    HCT 03.4 (L) 74.2 - 48.0 %    MCV 90.9 77.6 - 95.7 fL    MCH 30.7 25.9 - 32.4 pg    MCHC 33.8 32.0 - 36.0 g/dL    RDW 59.5 63.8 - 75.6 %    MPV 8.3 6.8 - 10.7 fL    Platelet 174 150 - 450 10*9/L    nRBC 0 <=4 /100 WBCs    Neutrophils % 74.2 %    Lymphocytes % 15.6 %    Monocytes % 9.4 %    Eosinophils % 0.6 %    Basophils % 0.2 %    Absolute Neutrophils 10.8 (H) 1.8 - 7.8 10*9/L    Absolute Lymphocytes 2.3 1.1 - 3.6 10*9/L    Absolute Monocytes 1.4 (H) 0.3 - 0.8 10*9/L    Absolute Eosinophils 0.1 0.0 - 0.5 10*9/L    Absolute Basophils 0.0 0.0 - 0.1 10*9/L     Labs/Studies:  Labs and Studies from the last 24hrs per EMR and Reviewed    Objective:  Temp:  [35.8 ??C (96.5 ??F)-36.7 ??C (98.1 ??F)] 35.8 ??C (96.5 ??F)  Pulse:  [95-98] 98  Resp:  [17-18] 18  BP: (128-142)/(71-75) 142/74  SpO2:  [93 %-99 %] 93 %    General - seated in chair, awake, no apparent distress  CV - RRR, no murmurs Pulm - faint rhonci b/l, could not appreciate wheezing

## 2023-08-02 NOTE — Unmapped (Signed)
 [facility] Specialty Medication Onboarding    Specialty Medication: Dupixent   Prior Authorization: Approved   Financial Assistance: No - copay  <$25  Final Copay/Day Supply: $0 / 14 days (LD) & 28 days (MD)    Insurance Restrictions: None     Notes to Pharmacist:   Credit Card on File: not applicable  Start Date on Rx:      The triage team has completed the benefits investigation and has determined that the patient is able to fill this medication at [facility]. Please contact the patient to complete the onboarding or follow up with the prescribing physician as needed.

## 2023-08-02 NOTE — Unmapped (Addendum)
 Pt got admitted for bronchiectasis. Alert and oriented x 4. On [place] @ 4 LPM. Pt ambulatory and independent. PICC intact. IV antibiotics administered and tolerated well. Denies pain. Contact precautions maintained. Verbalized plan of care. Working towards prioritized goals.    24 hour chart check completed.    Problem: Adult Inpatient Plan of Care  Goal: Plan of Care Review  Outcome: Progressing     Problem: Adult Inpatient Plan of Care  Goal: Absence of Hospital-Acquired Illness or Injury  Intervention: Prevent Skin Injury  Recent Flowsheet Documentation  Taken 08/01/2023 2000 by Caye Cocks, RN  Positioning for Skin: Sitting in Chair     Problem: Infection  Goal: Absence of Infection Signs and Symptoms  Outcome: Progressing  Intervention: Prevent or Manage Infection  Recent Flowsheet Documentation  Taken 08/01/2023 2200 by Deryck Hippler, RN  Isolation Precautions: contact precautions maintained  Taken 08/01/2023 2000 by Caye Cocks, RN  Isolation Precautions: contact precautions maintained

## 2023-08-03 LAB — BASIC METABOLIC PANEL
ANION GAP: 11 mmol/L (ref 5–14)
BLOOD UREA NITROGEN: 34 mg/dL — ABNORMAL HIGH (ref 9–23)
BUN / CREAT RATIO: 33
CALCIUM: 9.8 mg/dL (ref 8.7–10.4)
CHLORIDE: 97 mmol/L — ABNORMAL LOW (ref 98–107)
CO2: 34.4 mmol/L — ABNORMAL HIGH (ref 20.0–31.0)
CREATININE: 1.02 mg/dL (ref 0.73–1.18)
EGFR CKD-EPI (2021) MALE: 80 mL/min/1.73m2 (ref >=60)
GLUCOSE RANDOM: 88 mg/dL (ref 70–99)
POTASSIUM: 4 mmol/L (ref 3.4–4.8)
SODIUM: 142 mmol/L (ref 135–145)

## 2023-08-03 MED ADMIN — dornase alfa (PULMOZYME) 1 mg/mL solution 2.5 mg: 2.5 mg | RESPIRATORY_TRACT | @ 13:00:00

## 2023-08-03 MED ADMIN — cefTAZidime (FORTAZ) 2 g in sodium chloride 0.9 % (NS) 100 mL IVPB-MBP: 2 g | INTRAVENOUS | @ 18:00:00 | Stop: 2023-08-06

## 2023-08-03 MED ADMIN — sodium chloride (NS) 0.9 % flush 10 mL: 10 mL | INTRAVENOUS | @ 10:00:00

## 2023-08-03 MED ADMIN — albuterol 2.5 mg /3 mL (0.083 %) nebulizer solution 2.5 mg: 2.5 mg | RESPIRATORY_TRACT | @ 16:00:00

## 2023-08-03 MED ADMIN — atorvastatin (LIPITOR) tablet 10 mg: 10 mg | ORAL | @ 01:00:00

## 2023-08-03 MED ADMIN — cholecalciferol (vitamin D3 25 mcg (1,000 units)) tablet 50 mcg: 50 ug | ORAL | @ 13:00:00

## 2023-08-03 MED ADMIN — HYDROcodone-acetaminophen (HYCET) 7.5-325 mg/15 mL solution 7.5 mg of hydrocodone: 7.5 mg | ORAL | @ 16:00:00 | Stop: 2023-08-06

## 2023-08-03 MED ADMIN — sodium chloride 10 % NEBULIZER solution 5 mL: 5 mL | RESPIRATORY_TRACT | @ 16:00:00

## 2023-08-03 MED ADMIN — cefTAZidime (FORTAZ) 2 g in sodium chloride 0.9 % (NS) 100 mL IVPB-MBP: 2 g | INTRAVENOUS | @ 10:00:00 | Stop: 2023-08-06

## 2023-08-03 MED ADMIN — colistimethate (COLYMYCIN) 150 mg in sodium chloride (NS) 4 mL inhalation: 150 mg | RESPIRATORY_TRACT | @ 13:00:00

## 2023-08-03 MED ADMIN — sodium chloride 10 % NEBULIZER solution 5 mL: 5 mL | RESPIRATORY_TRACT | @ 01:00:00

## 2023-08-03 MED ADMIN — multivitamins, therapeutic with minerals tablet 1 tablet: 1 | ORAL | @ 13:00:00

## 2023-08-03 MED ADMIN — ferrous sulfate tablet 325 mg: 325 mg | ORAL | @ 22:00:00

## 2023-08-03 MED ADMIN — montelukast (SINGULAIR) tablet 10 mg: 10 mg | ORAL | @ 01:00:00

## 2023-08-03 MED ADMIN — HYDROcodone-acetaminophen (HYCET) 7.5-325 mg/15 mL solution 7.5 mg of hydrocodone: 7.5 mg | ORAL | @ 10:00:00 | Stop: 2023-08-06

## 2023-08-03 MED ADMIN — ferrous sulfate tablet 325 mg: 325 mg | ORAL | @ 13:00:00

## 2023-08-03 MED ADMIN — sodium chloride 10 % NEBULIZER solution 5 mL: 5 mL | RESPIRATORY_TRACT | @ 20:00:00

## 2023-08-03 MED ADMIN — albuterol 2.5 mg /3 mL (0.083 %) nebulizer solution 2.5 mg: 2.5 mg | RESPIRATORY_TRACT | @ 13:00:00

## 2023-08-03 MED ADMIN — azithromycin (ZITHROMAX) tablet 250 mg: 250 mg | ORAL | @ 13:00:00 | Stop: 2023-08-06

## 2023-08-03 MED ADMIN — albuterol 2.5 mg /3 mL (0.083 %) nebulizer solution 2.5 mg: 2.5 mg | RESPIRATORY_TRACT | @ 20:00:00

## 2023-08-03 MED ADMIN — dilTIAZem (CARDIZEM CD) 24 hr capsule 120 mg: 120 mg | ORAL | @ 13:00:00

## 2023-08-03 MED ADMIN — colistimethate (COLYMYCIN) 150 mg in sodium chloride (NS) 4 mL inhalation: 150 mg | RESPIRATORY_TRACT | @ 01:00:00

## 2023-08-03 MED ADMIN — fluticasone furoate-vilanterol (BREO ELLIPTA) 100-25 mcg/dose inhaler 1 puff: 1 | RESPIRATORY_TRACT | @ 13:00:00

## 2023-08-03 MED ADMIN — albuterol 2.5 mg /3 mL (0.083 %) nebulizer solution 2.5 mg: 2.5 mg | RESPIRATORY_TRACT | @ 01:00:00

## 2023-08-03 MED ADMIN — ascorbic acid (vitamin C) (VITAMIN C) tablet 1,000 mg: 1000 mg | ORAL | @ 13:00:00

## 2023-08-03 MED ADMIN — levoFLOXacin (LEVAQUIN) tablet 750 mg: 750 mg | ORAL | @ 10:00:00 | Stop: 2023-08-06

## 2023-08-03 MED ADMIN — pantoprazole (Protonix) EC tablet 20 mg: 20 mg | ORAL | @ 13:00:00

## 2023-08-03 MED ADMIN — sodium chloride (NS) 0.9 % flush 10 mL: 10 mL | INTRAVENOUS | @ 01:00:00

## 2023-08-03 MED ADMIN — umeclidinium (INCRUSE ELLIPTA) 62.5 mcg/actuation inhaler 1 puff: 1 | RESPIRATORY_TRACT | @ 13:00:00

## 2023-08-03 MED ADMIN — predniSONE (DELTASONE) tablet 30 mg: 30 mg | ORAL | @ 13:00:00

## 2023-08-03 MED ADMIN — cefTAZidime (FORTAZ) 2 g in sodium chloride 0.9 % (NS) 100 mL IVPB-MBP: 2 g | INTRAVENOUS | @ 02:00:00 | Stop: 2023-08-06

## 2023-08-03 MED ADMIN — HYDROcodone-acetaminophen (HYCET) 7.5-325 mg/15 mL solution 7.5 mg of hydrocodone: 7.5 mg | ORAL | @ 22:00:00 | Stop: 2023-08-06

## 2023-08-03 MED ADMIN — sodium chloride (NS) 0.9 % flush 10 mL: 10 mL | INTRAVENOUS | @ 16:00:00

## 2023-08-03 MED ADMIN — sodium chloride 10 % NEBULIZER solution 5 mL: 5 mL | RESPIRATORY_TRACT | @ 13:00:00

## 2023-08-03 NOTE — Unmapped (Signed)
 General Pulmonary Team Follow Up Consult Note     Date of Service: 08/03/2023  Requesting Physician: Ejaz Arshad Janjua, DO   Requesting Service: Med Undesignated (MDX)  Reason for consultation: Comprehensive evaluation of bronchiectasis.    Hospital Problems:  Principal Problem:    Bronchiectasis    Active Problems:    Essential hypertension    Gastroesophageal reflux disease without esophagitis    Acute on chronic respiratory failure with hypoxemia      Sensory neuropathy      HPI: Jordan [name] is a 68 y.o. male with bronchiectasis, asthma/COPD overlap who presents for bronchiectasis exacerbation. Plan to complete abx while inpatient (due to coverage).     Problems addressed during this consult include bronchiectasis. Based on these problems, the patient has moderate risk of morbidity/mortality which is commensurate w their risk of management options described below in the recommendations.    Assessment      Interval Events:   -overall feels very similar than before, slowly improving oxygenation  -has minimal mucus production which he worries about means he is not progressing.       Impression: The patient has made some clinical progress since last encounter. I personally reviewed most recent pertinent labs, imaging and micro data, from 08/03/2023, which are noted in recommendations..        Recommendations     Antibiotics:  Based on prior culture data (OPF, PsA) and allergy profile, favor treating with:  IV ceftazidime   Oral levofloxacin   Inhaled colistin  LRCX +pseudomonas  S/p PICC line placement for IV antibiotics  Anticipated ED of abx is 08/06/23 - plan to complete inpatient   Continue chronic azithromycin  for anti-inflammatory effect.     Other medications:  Prednisone  40 mg daily x 5 days. Taper by 10 mg every 5 days. Have decreased to prednisone  30 mg since patient wou  Convert Trelegy to formulary equivalent.  May change to Trelegy 200 after discharge.  COVID vaccine prior to discharge     Airway clearance: Needs aggressive airway clearance 4 times per day.  Nebulized hypertonic saline 10%; pre-treatment with albuterol .  For mechanical clearance, prefer metaneb and Vibralung- patient using vest some as he likes the results  Pulmozyme  2.5 mg nebulized daily       Labs:  On admission, CBC-D, CMP, CRP, ESR.  Sputum for bacterial and AFB cultures (NTM screening) sent from clinic.   CRP and ESR every 4-5 days to trend (including as outpatient).  [facility] Guideline for Monitoring Outpatient Intravenous and Oral Antimicrobial Therapy recommended monitoring labs ([email or web address])     Imaging:  None     Other Testing:  Would benefit from repeat PFTs with another FVL performed while in the hospital, will order for Wed/Thursday    [facility]):  Patient is not a candidate for [facility] due to distance.     Dispo:   Patient will need follow-up appointment with bronchiectasis clinic in 2-3 months with FVL.  Patient does not need to remain in the hospital for the entire course of IV antibiotics.   Please reach out to [facility] Bronchiectasis/[facility] team Leota Randy RN, [place] Levels CPP) prior to discharge to ensure that home infusion orders are correct and that we are comfortable with patient discharging home on IVs at that time.  We will follow monitoring labs.  Please use DOTPULMIVDISCHARGE for home infusion orders. We will follow monitoring labs. Does not need OPAT.  Do not discharge Friday through  Sunday or on a holiday as no outpatient clinical staff available to assist if problems arise with home health/home infusion.       Ellie Gutta, MD    Subjective & Objective     Vitals - past 24 hours  Temp:  [36.2 ??C (97.1 ??F)-36.2 ??C (97.2 ??F)] 36.2 ??C (97.2 ??F)  Pulse: [83-104] 83  Resp:  [17-18] 17  BP: (127-137)/(75-84) 137/75  SpO2:  [92 %-97 %] 95 % Intake/Output  I/O last 3 completed shifts:  In: 620 [P.O.:600; I.V.:20]  Out: -       Pertinent exam findings:   General appearance - well appearing, well nourished, and in no distress  Heart - regular rate and rhythm, normal S1/S2, no gallops, rubs, or murmurs  Chest - stable rhonchi  Neuro: AAOx3  ENT: MMM, wearing nasal cannula  LE: 2+ LE edema, L>R       Relevant Imaging:  - CXR on 07/23/23 revealed Right apical pleural thickening with scarring and volume loss in the right lung apex. Elevated right hemidiaphragm. Extensive bronchiectasis and bronchial wall thickening bilaterally.     Cultures:  Lower Respiratory Culture (no units)   Date Value   07/23/2023 4+ Mucoid Pseudomonas aeruginosa (A)   07/23/2023 3+ Oropharyngeal Flora Isolated   07/23/2023 <1+ Smooth Pseudomonas aeruginosa (A)     WBC (10*9/L)   Date Value   08/02/2023 14.6 (H)          Other Labs:  Lab Results   Component Value Date    WBC 14.6 (H) 08/02/2023    HGB 12.9 08/02/2023    HCT 38.3 (L) 08/02/2023    PLT 174 08/02/2023     Lab Results   Component Value Date    NA 142 08/03/2023    K 4.0 08/03/2023    CL 97 (L) 08/03/2023    CO2 34.4 (H) 08/03/2023    BUN 34 (H) 08/03/2023    CREATININE 1.02 08/03/2023    GLU 88 08/03/2023    CALCIUM  9.8 08/03/2023    MG 1.5 (L) 12/22/2021    PHOS 3.7 01/22/2016     Lab Results   Component Value Date    BILITOT 0.2 (L) 07/23/2023    BILIDIR 0.40 04/02/2015    PROT 7.8 07/23/2023    ALBUMIN 3.7 07/23/2023    ALT 16 07/23/2023    AST 18 07/23/2023    ALKPHOS 96 07/23/2023    GGT 64 06/21/2013     Lab Results   Component Value Date    LABPROT 10.4 08/03/2013    INR 0.95 04/14/2017    APTT 32.6 07/25/2014       Allergies & Home Medications   Personally reviewed in Epic    Continuous Infusions:       Scheduled Medications:    albuterol   2.5 mg Nebulization 4x Daily (RT)    ascorbic acid  (vitamin C )  1,000 mg Oral Daily atorvastatin   10 mg Oral Daily    azithromycin   250 mg Oral Daily    cefTAZidime   2 g Intravenous Q8H SCH    cholecalciferol  (vitamin D3 25 mcg (1,000 units))  50 mcg Oral Daily    colistimethate  (COLYMYCIN) 150 mg in sodium chloride  (NS) 4 mL inhalation  150 mg Inhalation Q12H SCH    dilTIAZem   120 mg Oral Daily    dornase alfa   2.5 mg Inhalation Daily (RT)    ferrous sulfate   325 mg Oral BID with meals    fluticasone  furoate-vilanterol  1 puff Inhalation Daily (RT)    And    umeclidinium  1 puff Inhalation Daily (RT)    HYDROcodone -acetaminophen   7.5 mg of hydrocodone  Oral TID    levoFLOXacin   750 mg Oral daily    montelukast   10 mg Oral Nightly    multivitamins (ADULT)  1 tablet Oral Daily    pantoprazole   20 mg Oral Daily before breakfast    predniSONE   30 mg Oral Daily    sodium chloride   10 mL Intravenous Q8H    sodium chloride   5 mL Nebulization 4x Daily (RT)       PRN medications:  guaiFENesin , ondansetron  **OR** ondansetron 

## 2023-08-03 NOTE — Unmapped (Signed)
 Shift Summary   Oxygen therapy was maintained at 3 L/min with humidification, supporting respiratory function.    Vitals remained stable throughout the shift, with no signs of infection or elevated temperature.    Pain was consistently reported as 0, indicating effective pain management.    Hourly checks ensured safety, with no falls or injuries observed.    Overall, the patient remained stable with no significant changes in condition.     Absence of Infection Signs and Symptoms: Temperature remained stable at 36.2??C throughout the shift, indicating no signs of fever or infection.     Absence of Fall and Fall-Related Injury: Hourly visual checks confirmed the patient was either awake in the chair or asleep in bed, with no falls or injuries reported.

## 2023-08-03 NOTE — Unmapped (Signed)
 Patient was compliant with all breathing treatments as scheduled and used chest vest and metaneb for airway clearance.    Problem: Airway Clearance Ineffective  Goal: Effective Airway Clearance  Outcome: Ongoing - Unchanged

## 2023-08-03 NOTE — Unmapped (Signed)
 Daily Progress Note    Assessment/Plan:    Principal Problem:    Bronchiectasis    Active Problems:    Essential hypertension    Gastroesophageal reflux disease without esophagitis    Acute on chronic respiratory failure with hypoxemia      Sensory neuropathy                 Jordan [name] is a 68 y.o. male who is presenting to [facility] with Bronchiectasis, in the setting of the following pertinent/contributing co-morbidities: idiopathic bronchiectasis with chronic pseudomonas colonization, right middle lobe lobectomy and right lower lobe wedge resection chronic hypoxemia on 2 liters of O2 at home, HTN, chronic peripheral neuropathy/lumbosacral radiculopathy on chronic norco  at home, osteoporosis on yearly zoledronic  acid .     1 - bronchiectasis with exacerbation:  continue IV ceftazidime , po levaquin , inhaled colistin.  His sputum cx is c/w with pseudomonas (chronically colonized).  Airway clearance with metaneb/volera, hypertonic saline 10% nebs with QID with albuterol  pre-treatment.  Continue dornase and formulary equivalent of home inhalers.  Continue montelukast  and chronic azithromycin .  Prednisone  dose reduced again to 30 mg po daily yesterday.  D/w pulm and suspect the decrease in coughing is actually clinical improvement.  This is supported by reduction in his o2 requirements (was on 6 liters when he arrived, now on 4 liters).  He uses 2 liters [place] at home but has reportedly told others it is 2 to 4 liters.  He needs antibiotics through 5/16 and cost is prohibiting home infusions at this time.      2 - asthma with allergic component:  on dupixent  at home, pulmonology working on approval for [facility]  outpatient.      3 - paroxysmal SVT:  follows with [facility] cardiology, clinically stable on diltiazem .      4 - Chronic pain - due to lumbosacral radiculopathy - on home norco .      5 - osteoporosis:  on yearly zoledronic  acid with last dose 02/2023.      6 - leukocytosis - no increase in O2 requirements, no new sources of infection clinically, no evidence of a DVT anywhere - suspect this is all driven by prednisone .      DVT PPX:  ambulatory     Disposition:  home hopefully 5/16 (can move up timing of last dose of ceftazidime  to facilitate a 5/16 evening discharge).     ___________________________________________________________________    Subjective:  No acute events overnight.      Recent Results (from the past 24 hours)   Basic Metabolic Panel    Collection Time: 08/03/23  5:44 AM   Result Value Ref Range    Sodium 142 135 - 145 mmol/L    Potassium 4.0 3.4 - 4.8 mmol/L    Chloride 97 (L) 98 - 107 mmol/L    CO2 34.4 (H) 20.0 - 31.0 mmol/L    Anion Gap 11 5 - 14 mmol/L    BUN 34 (H) 9 - 23 mg/dL    Creatinine 5.40 9.81 - 1.18 mg/dL    BUN/Creatinine Ratio 33     eGFR CKD-EPI (2021) Male 80 >=60 mL/min/1.59m2    Glucose 88 70 - 99 mg/dL    Calcium  9.8 8.7 - 10.4 mg/dL     Labs/Studies:  Labs and Studies from the last 24hrs per EMR and Reviewed    Objective:  Temp:  [36.2 ??C (97.1 ??F)-36.2 ??C (97.2 ??F)] 36.2 ??C (97.2 ??F)  Pulse:  [83-102] 83  Resp:  [  17-18] 17  BP: (127-137)/(75-84) 137/75  SpO2:  [92 %-97 %] 95 %    General - seated in chair, awake, no apparent distress  CV - RRR, no murmurs   Pulm - faint rhonci b/l, could not appreciate wheezing

## 2023-08-03 NOTE — Unmapped (Signed)
 Shift Summary   Contact precautions were maintained throughout the shift, reducing infection risk.    The PICC line was assessed and found to be clean, dry, and intact, requiring no intervention.    The expected discharge disposition was confirmed as home, indicating readiness for transition.    The patient remained awake and in a chair during hourly checks, with no falls or injuries reported.     Readiness for Transition of Care: The expected discharge disposition is home, indicating readiness for transition of care.     Absence of Infection Signs and Symptoms: Contact precautions were maintained, and perineal hygiene was encouraged, with no new signs of infection noted.     Absence of Fall and Fall-Related Injury: Hourly visual checks showed the patient was consistently awake and in a chair, with no falls or injuries reported.    Plan of care reviewed with pt. Pt taking meds as prescribed. Receiving IV antibiotics via PICC line as prescribed. Chest vest therapy in use. Pt on 3L oxygen via nasal canula, no calls from continuous pulse ox. Will continue to monitor.

## 2023-08-04 MED ADMIN — multivitamins, therapeutic with minerals tablet 1 tablet: 1 | ORAL | @ 13:00:00

## 2023-08-04 MED ADMIN — cefTAZidime (FORTAZ) 2 g in sodium chloride 0.9 % (NS) 100 mL IVPB-MBP: 2 g | INTRAVENOUS | @ 18:00:00 | Stop: 2023-08-06

## 2023-08-04 MED ADMIN — fluticasone furoate-vilanterol (BREO ELLIPTA) 100-25 mcg/dose inhaler 1 puff: 1 | RESPIRATORY_TRACT | @ 13:00:00

## 2023-08-04 MED ADMIN — sodium chloride (NS) 0.9 % flush 10 mL: 10 mL | INTRAVENOUS | @ 18:00:00

## 2023-08-04 MED ADMIN — sodium chloride 10 % NEBULIZER solution 5 mL: 5 mL | RESPIRATORY_TRACT | @ 01:00:00

## 2023-08-04 MED ADMIN — cholecalciferol (vitamin D3 25 mcg (1,000 units)) tablet 50 mcg: 50 ug | ORAL | @ 13:00:00

## 2023-08-04 MED ADMIN — colistimethate (COLYMYCIN) 150 mg in sodium chloride (NS) 4 mL inhalation: 150 mg | RESPIRATORY_TRACT | @ 01:00:00

## 2023-08-04 MED ADMIN — umeclidinium (INCRUSE ELLIPTA) 62.5 mcg/actuation inhaler 1 puff: 1 | RESPIRATORY_TRACT | @ 13:00:00

## 2023-08-04 MED ADMIN — albuterol 2.5 mg /3 mL (0.083 %) nebulizer solution 2.5 mg: 2.5 mg | RESPIRATORY_TRACT | @ 13:00:00

## 2023-08-04 MED ADMIN — HYDROcodone-acetaminophen (HYCET) 7.5-325 mg/15 mL solution 7.5 mg of hydrocodone: 7.5 mg | ORAL | @ 10:00:00 | Stop: 2023-08-06

## 2023-08-04 MED ADMIN — montelukast (SINGULAIR) tablet 10 mg: 10 mg | ORAL | @ 02:00:00

## 2023-08-04 MED ADMIN — predniSONE (DELTASONE) tablet 30 mg: 30 mg | ORAL | @ 13:00:00

## 2023-08-04 MED ADMIN — sodium chloride (NS) 0.9 % flush 10 mL: 10 mL | INTRAVENOUS | @ 02:00:00

## 2023-08-04 MED ADMIN — albuterol 2.5 mg /3 mL (0.083 %) nebulizer solution 2.5 mg: 2.5 mg | RESPIRATORY_TRACT | @ 19:00:00

## 2023-08-04 MED ADMIN — sodium chloride (NS) 0.9 % flush 10 mL: 10 mL | INTRAVENOUS | @ 10:00:00

## 2023-08-04 MED ADMIN — atorvastatin (LIPITOR) tablet 10 mg: 10 mg | ORAL | @ 02:00:00

## 2023-08-04 MED ADMIN — ferrous sulfate tablet 325 mg: 325 mg | ORAL | @ 13:00:00

## 2023-08-04 MED ADMIN — HYDROcodone-acetaminophen (HYCET) 7.5-325 mg/15 mL solution 7.5 mg of hydrocodone: 7.5 mg | ORAL | @ 18:00:00 | Stop: 2023-08-06

## 2023-08-04 MED ADMIN — cefTAZidime (FORTAZ) 2 g in sodium chloride 0.9 % (NS) 100 mL IVPB-MBP: 2 g | INTRAVENOUS | @ 10:00:00 | Stop: 2023-08-06

## 2023-08-04 MED ADMIN — dornase alfa (PULMOZYME) 1 mg/mL solution 2.5 mg: 2.5 mg | RESPIRATORY_TRACT | @ 13:00:00

## 2023-08-04 MED ADMIN — colistimethate (COLYMYCIN) 150 mg in sodium chloride (NS) 4 mL inhalation: 150 mg | RESPIRATORY_TRACT | @ 13:00:00

## 2023-08-04 MED ADMIN — ascorbic acid (vitamin C) (VITAMIN C) tablet 1,000 mg: 1000 mg | ORAL | @ 13:00:00

## 2023-08-04 MED ADMIN — pantoprazole (Protonix) EC tablet 20 mg: 20 mg | ORAL | @ 13:00:00

## 2023-08-04 MED ADMIN — levoFLOXacin (LEVAQUIN) tablet 750 mg: 750 mg | ORAL | @ 10:00:00 | Stop: 2023-08-06

## 2023-08-04 MED ADMIN — albuterol 2.5 mg /3 mL (0.083 %) nebulizer solution 2.5 mg: 2.5 mg | RESPIRATORY_TRACT | @ 01:00:00

## 2023-08-04 MED ADMIN — cefTAZidime (FORTAZ) 2 g in sodium chloride 0.9 % (NS) 100 mL IVPB-MBP: 2 g | INTRAVENOUS | @ 02:00:00 | Stop: 2023-08-06

## 2023-08-04 MED ADMIN — dilTIAZem (CARDIZEM CD) 24 hr capsule 120 mg: 120 mg | ORAL | @ 13:00:00

## 2023-08-04 MED ADMIN — azithromycin (ZITHROMAX) tablet 250 mg: 250 mg | ORAL | @ 13:00:00 | Stop: 2023-08-06

## 2023-08-04 NOTE — Unmapped (Signed)
 General Pulmonary Team Follow Up Consult Note     Date of Service: 08/04/2023  Requesting Physician: Ejaz Arshad Janjua, DO   Requesting Service: Med Undesignated (MDX)  Reason for consultation: Comprehensive evaluation of bronchiectasis.    Hospital Problems:  Principal Problem:    Bronchiectasis    Active Problems:    Essential hypertension    Gastroesophageal reflux disease without esophagitis    Acute on chronic respiratory failure with hypoxemia      Sensory neuropathy      HPI: Jordan [name] is a 68 y.o. male with bronchiectasis, asthma/COPD overlap who presents for bronchiectasis exacerbation. Plan to complete abx while inpatient (due to coverage).     Problems addressed during this consult include bronchiectasis. Based on these problems, the patient has moderate risk of morbidity/mortality which is commensurate w their risk of management options described below in the recommendations.    Assessment      Interval Events:   -this am he had one episode of frank hemoptysis, no re-occurrence since despite coughing. Holding hypertonic (10%)      Impression: The patient has made some clinical progress since last encounter. I personally reviewed most recent pertinent labs, imaging and micro data, from 08/04/2023, which are noted in recommendations..        Recommendations     Antibiotics:  Based on prior culture data (OPF, PsA) and allergy profile, favor treating with:  IV ceftazidime   Oral levofloxacin   Inhaled colistin  LRCX +pseudomonas  S/p PICC line placement for IV antibiotics  Anticipated ED of abx is 08/06/23 - plan to complete inpatient   Continue chronic azithromycin  for anti-inflammatory effect.     Other medications:  Prednisone  40 mg daily x 5 days. Taper by 10 mg every 5 days. Have decreased to prednisone  30 mg   Convert Trelegy to formulary equivalent.  May change to Trelegy 200 after discharge.  COVID vaccine prior to discharge     Airway clearance: Needs aggressive airway clearance 4 times per day.  Nebulized hypertonic saline 10%; pre-treatment with albuterol . - holding for now with hemoptysis  For mechanical clearance, prefer metaneb and Vibralung- patient using vest some as he likes the results  Pulmozyme  2.5 mg nebulized daily       Labs:  On admission, CBC-D, CMP, CRP, ESR.  Sputum for bacterial and AFB cultures (NTM screening) sent from clinic.   CRP and ESR every 4-5 days to trend (including as outpatient).  [facility] Guideline for Monitoring Outpatient Intravenous and Oral Antimicrobial Therapy recommended monitoring labs ([email or web address])     Imaging:  None     Other Testing:  Holding PFTs today with hemoptysis. CXR unremarkable.     [facility]):  Patient is not a candidate for [facility] due to distance.     Dispo:   Patient will need follow-up appointment with bronchiectasis clinic in 2-3 months with FVL.  Patient does not need to remain in the hospital for the entire course of IV antibiotics.   Please reach out to [facility] Bronchiectasis/NTM [facility] team Leota Randy RN, [place] Levels CPP) prior to discharge to ensure that home infusion orders are correct and that we are comfortable with patient discharging home on IVs at that time.  We will follow monitoring labs.  Please use DOTPULMIVDISCHARGE for home infusion orders. We will follow monitoring labs. Does not need OPAT.  Do not discharge Friday through Sunday or on a holiday as no outpatient clinical staff available to  assist if problems arise with home health/home infusion.       Ellie Gutta, MD    Subjective & Objective     Vitals - past 24 hours  Temp:  [36.8 ??C (98.2 ??F)-36.9 ??C (98.4 ??F)] 36.9 ??C (98.4 ??F)  Pulse:  [83-106] 94  Resp:  [18-20] 18  BP: (127-152)/(76-82) 127/82  SpO2:  [92 %-98 %] 94 % Intake/Output  I/O last 3 completed shifts:  In: 950 [P.O.:850; IV Piggyback:100]  Out: -       Pertinent exam findings:   General appearance - appears more tired today  Heart - regular rate and rhythm, normal S1/S2, no gallops, rubs, or murmurs  Chest - stable rhonchi  Neuro: AAOx3  ENT: MMM, wearing nasal cannula  LE: 2+ LE edema, L>R       Relevant Imaging:  - CXR on 07/23/23 revealed Right apical pleural thickening with scarring and volume loss in the right lung apex. Elevated right hemidiaphragm. Extensive bronchiectasis and bronchial wall thickening bilaterally.     Cultures:  Lower Respiratory Culture (no units)   Date Value   07/23/2023 4+ Mucoid Pseudomonas aeruginosa (A)   07/23/2023 3+ Oropharyngeal Flora Isolated   07/23/2023 <1+ Smooth Pseudomonas aeruginosa (A)     WBC (10*9/L)   Date Value   08/02/2023 14.6 (H)          Other Labs:  Lab Results   Component Value Date    WBC 14.6 (H) 08/02/2023    HGB 12.9 08/02/2023    HCT 38.3 (L) 08/02/2023    PLT 174 08/02/2023     Lab Results   Component Value Date    NA 142 08/03/2023    K 4.0 08/03/2023    CL 97 (L) 08/03/2023    CO2 34.4 (H) 08/03/2023    BUN 34 (H) 08/03/2023    CREATININE 1.02 08/03/2023    GLU 88 08/03/2023    CALCIUM  9.8 08/03/2023    MG 1.5 (L) 12/22/2021    PHOS 3.7 01/22/2016     Lab Results   Component Value Date    BILITOT 0.2 (L) 07/23/2023    BILIDIR 0.40 04/02/2015    PROT 7.8 07/23/2023    ALBUMIN 3.7 07/23/2023    ALT 16 07/23/2023    AST 18 07/23/2023    ALKPHOS 96 07/23/2023    GGT 64 06/21/2013     Lab Results   Component Value Date    LABPROT 10.4 08/03/2013    INR 0.95 04/14/2017    APTT 32.6 07/25/2014       Allergies & Home Medications   Personally reviewed in Epic    Continuous Infusions:       Scheduled Medications:    albuterol   2.5 mg Nebulization 4x Daily (RT)    ascorbic acid  (vitamin C )  1,000 mg Oral Daily    atorvastatin   10 mg Oral Daily    azithromycin   250 mg Oral Daily    cefTAZidime   2 g Intravenous Q8H SCH    cholecalciferol  (vitamin D3 25 mcg (1,000 units))  50 mcg Oral Daily    colistimethate  (COLYMYCIN) 150 mg in sodium chloride  (NS) 4 mL inhalation  150 mg Inhalation Q12H [facility]    dilTIAZem   120 mg Oral Daily    dornase alfa   2.5 mg Inhalation Daily (RT)    ferrous sulfate   325 mg Oral BID with meals    fluticasone  furoate-vilanterol  1 puff Inhalation Daily (RT)    And  umeclidinium  1 puff Inhalation Daily (RT)    HYDROcodone -acetaminophen   7.5 mg of hydrocodone  Oral TID    levoFLOXacin   750 mg Oral daily    montelukast   10 mg Oral Nightly    multivitamins (ADULT)  1 tablet Oral Daily    pantoprazole   20 mg Oral Daily before breakfast    predniSONE   30 mg Oral Daily    sodium chloride   10 mL Intravenous Q8H    sodium chloride   5 mL Nebulization 4x Daily (RT)       PRN medications:  guaiFENesin , magic mouthwash oral **AND** lidocaine , ondansetron  **OR** ondansetron 

## 2023-08-04 NOTE — Unmapped (Signed)
 Daily Progress Note    Assessment/Plan:    Principal Problem:    Bronchiectasis    Active Problems:    Essential hypertension    Gastroesophageal reflux disease without esophagitis    Acute on chronic respiratory failure with hypoxemia      Sensory neuropathy                 Jordan [name] is a 68 y.o. male who is presenting to [facility] with Bronchiectasis, in the setting of the following pertinent/contributing co-morbidities: idiopathic bronchiectasis with chronic pseudomonas colonization, right middle lobe lobectomy and right lower lobe wedge resection chronic hypoxemia on 2 liters of O2 at home, HTN, chronic peripheral neuropathy/lumbosacral radiculopathy on chronic norco  at home, osteoporosis on yearly zoledronic  acid .     1 - bronchiectasis with exacerbation:  continue IV ceftazidime , po levaquin , inhaled colistin.  His sputum cx is c/w with pseudomonas (chronically colonized).  Airway clearance with metaneb/volera, hypertonic saline 10% nebs with QID with albuterol  pre-treatment.  Continue dornase and formulary equivalent of home inhalers.  Continue montelukast  and chronic azithromycin .  Prednisone  dose reduced again to 30 mg po daily yesterday.  D/w pulm and suspect the decrease in coughing is actually clinical improvement.  This is supported by reduction in his o2 requirements (was on 6 liters when he arrived, now on 3 liters).  He uses 2 liters [place] at home but has reportedly told others it is 2 to 4 liters.  He needs antibiotics through 5/16 and cost is prohibiting home infusions at this time.      2 - asthma with allergic component:  on dupixent  at home, pulmonology working on approval for [facility]  outpatient.      3 - paroxysmal SVT:  follows with [facility] cardiology, clinically stable on diltiazem .      4 - Chronic pain - due to lumbosacral radiculopathy - on home norco .      5 - osteoporosis:  on yearly zoledronic  acid with last dose 02/2023.      6 - leukocytosis - no increase in O2 requirements, no new sources of infection clinically, no evidence of a DVT anywhere - suspect this is all driven by prednisone .      DVT PPX:  ambulatory     Disposition:  home hopefully 5/16 (can move up timing of last dose of ceftazidime  to facilitate a 5/16 evening discharge).     ___________________________________________________________________    Subjective:  No acute events overnight.  Patient concerned about hemoptysis he reported.  When asked, he stated he didn't cough up blood but blood was seen in his mouth and looked like sputum.  Was concerned about this because of prior massive hemoptysis.      No results found for this or any previous visit (from the past 24 hours).    Labs/Studies:  Labs and Studies from the last 24hrs per EMR and Reviewed    Objective:  Temp:  [36.4 ??C (97.5 ??F)-36.8 ??C (98.2 ??F)] 36.8 ??C (98.2 ??F)  Pulse:  [80-100] 83  Resp:  [17-20] 18  BP: (133-152)/(76-82) 152/80  SpO2:  [93 %-98 %] 95 %    General - seated in chair, awake, no apparent distress  CV - RRR, no murmurs   Pulm - faint rhonci b/l, could not appreciate wheezing

## 2023-08-04 NOTE — Unmapped (Signed)
 Shift Summary   Oxygen therapy was maintained at 3 L/min, ensuring adequate respiratory support.    Pain was effectively managed with a consistent score of 0 on the pain scale.    No acute changes in mental status were observed, indicating stable cognitive function.    Bruising was noted on the skin, with scattered locations, but no new skin integrity issues were reported.    Overall, the patient remained stable with no signs of infection or discomfort.     Optimal Comfort and Wellbeing: Pain was consistently reported as 0 on a 0-10 scale, indicating effective pain management throughout the shift.     Absence of Infection Signs and Symptoms: Temperature remained stable at 36.8 ??C (98.2 ??F) throughout the shift, with no signs of infection observed.

## 2023-08-05 MED ADMIN — levoFLOXacin (LEVAQUIN) tablet 750 mg: 750 mg | ORAL | @ 10:00:00 | Stop: 2023-08-05

## 2023-08-05 MED ADMIN — ferrous sulfate tablet 325 mg: 325 mg | ORAL | @ 13:00:00

## 2023-08-05 MED ADMIN — sodium chloride 10 % NEBULIZER solution 5 mL: 5 mL | RESPIRATORY_TRACT | @ 16:00:00

## 2023-08-05 MED ADMIN — fluticasone furoate-vilanterol (BREO ELLIPTA) 100-25 mcg/dose inhaler 1 puff: 1 | RESPIRATORY_TRACT | @ 12:00:00

## 2023-08-05 MED ADMIN — azithromycin (ZITHROMAX) tablet 250 mg: 250 mg | ORAL | @ 13:00:00 | Stop: 2023-08-06

## 2023-08-05 MED ADMIN — HYDROcodone-acetaminophen (HYCET) 7.5-325 mg/15 mL solution 7.5 mg of hydrocodone: 7.5 mg | ORAL | @ 22:00:00 | Stop: 2023-08-06

## 2023-08-05 MED ADMIN — colistimethate (COLYMYCIN) 150 mg in sodium chloride (NS) 4 mL inhalation: 150 mg | RESPIRATORY_TRACT

## 2023-08-05 MED ADMIN — lidocaine (XYLOCAINE) 2% viscous mucosal solution: 15 mL | OROMUCOSAL | @ 18:00:00

## 2023-08-05 MED ADMIN — atorvastatin (LIPITOR) tablet 10 mg: 10 mg | ORAL | @ 01:00:00

## 2023-08-05 MED ADMIN — HYDROcodone-acetaminophen (HYCET) 7.5-325 mg/15 mL solution 7.5 mg of hydrocodone: 7.5 mg | ORAL | @ 16:00:00 | Stop: 2023-08-06

## 2023-08-05 MED ADMIN — colistimethate (COLYMYCIN) 150 mg in sodium chloride (NS) 4 mL inhalation: 150 mg | RESPIRATORY_TRACT | @ 12:00:00

## 2023-08-05 MED ADMIN — cefTAZidime (FORTAZ) 2 g in sodium chloride 0.9 % (NS) 100 mL IVPB-MBP: 2 g | INTRAVENOUS | @ 18:00:00 | Stop: 2023-08-06

## 2023-08-05 MED ADMIN — cefTAZidime (FORTAZ) 2 g in sodium chloride 0.9 % (NS) 100 mL IVPB-MBP: 2 g | INTRAVENOUS | @ 01:00:00 | Stop: 2023-08-06

## 2023-08-05 MED ADMIN — albuterol 2.5 mg /3 mL (0.083 %) nebulizer solution 2.5 mg: 2.5 mg | RESPIRATORY_TRACT

## 2023-08-05 MED ADMIN — HYDROcodone-acetaminophen (HYCET) 7.5-325 mg/15 mL solution 7.5 mg of hydrocodone: 7.5 mg | ORAL | @ 10:00:00 | Stop: 2023-08-06

## 2023-08-05 MED ADMIN — albuterol 2.5 mg /3 mL (0.083 %) nebulizer solution 2.5 mg: 2.5 mg | RESPIRATORY_TRACT | @ 20:00:00

## 2023-08-05 MED ADMIN — magic mouthwash oral suspension (diphenhydrAMINE, nystatin) 10 mL: 10 mL | ORAL | @ 18:00:00

## 2023-08-05 MED ADMIN — umeclidinium (INCRUSE ELLIPTA) 62.5 mcg/actuation inhaler 1 puff: 1 | RESPIRATORY_TRACT | @ 12:00:00

## 2023-08-05 MED ADMIN — albuterol 2.5 mg /3 mL (0.083 %) nebulizer solution 2.5 mg: 2.5 mg | RESPIRATORY_TRACT | @ 15:00:00

## 2023-08-05 MED ADMIN — ferrous sulfate tablet 325 mg: 325 mg | ORAL | @ 22:00:00

## 2023-08-05 MED ADMIN — sodium chloride (NS) 0.9 % flush 10 mL: 10 mL | INTRAVENOUS | @ 16:00:00

## 2023-08-05 MED ADMIN — montelukast (SINGULAIR) tablet 10 mg: 10 mg | ORAL | @ 01:00:00

## 2023-08-05 MED ADMIN — multivitamins, therapeutic with minerals tablet 1 tablet: 1 | ORAL | @ 13:00:00

## 2023-08-05 MED ADMIN — sodium chloride (NS) 0.9 % flush 10 mL: 10 mL | INTRAVENOUS | @ 10:00:00

## 2023-08-05 MED ADMIN — sodium chloride 10 % NEBULIZER solution 5 mL: 5 mL | RESPIRATORY_TRACT | @ 20:00:00

## 2023-08-05 MED ADMIN — predniSONE (DELTASONE) tablet 30 mg: 30 mg | ORAL | @ 13:00:00 | Stop: 2023-08-05

## 2023-08-05 MED ADMIN — cholecalciferol (vitamin D3 25 mcg (1,000 units)) tablet 50 mcg: 50 ug | ORAL | @ 13:00:00

## 2023-08-05 MED ADMIN — cefTAZidime (FORTAZ) 2 g in sodium chloride 0.9 % (NS) 100 mL IVPB-MBP: 2 g | INTRAVENOUS | @ 10:00:00 | Stop: 2023-08-06

## 2023-08-05 MED ADMIN — sodium chloride (NS) 0.9 % flush 10 mL: 10 mL | INTRAVENOUS | @ 01:00:00

## 2023-08-05 MED ADMIN — HYDROcodone-acetaminophen (HYCET) 7.5-325 mg/15 mL solution 7.5 mg of hydrocodone: 7.5 mg | ORAL | @ 01:00:00 | Stop: 2023-08-06

## 2023-08-05 MED ADMIN — albuterol 2.5 mg /3 mL (0.083 %) nebulizer solution 2.5 mg: 2.5 mg | RESPIRATORY_TRACT | @ 12:00:00

## 2023-08-05 MED ADMIN — pantoprazole (Protonix) EC tablet 20 mg: 20 mg | ORAL | @ 13:00:00

## 2023-08-05 MED ADMIN — dornase alfa (PULMOZYME) 1 mg/mL solution 2.5 mg: 2.5 mg | RESPIRATORY_TRACT | @ 12:00:00

## 2023-08-05 MED ADMIN — sodium chloride 10 % NEBULIZER solution 5 mL: 5 mL | RESPIRATORY_TRACT

## 2023-08-05 MED ADMIN — ferrous sulfate tablet 325 mg: 325 mg | ORAL | @ 01:00:00

## 2023-08-05 MED ADMIN — ascorbic acid (vitamin C) (VITAMIN C) tablet 1,000 mg: 1000 mg | ORAL | @ 13:00:00

## 2023-08-05 MED ADMIN — dilTIAZem (CARDIZEM CD) 24 hr capsule 120 mg: 120 mg | ORAL | @ 13:00:00

## 2023-08-05 NOTE — Unmapped (Signed)
 Problem: Airway Clearance Ineffective  Goal: Effective Airway Clearance  Outcome: Ongoing - Unchanged   Pt tolerated all scheduled Airwayclearence Metaneb combined with chest vest. Started Hypertonic saline, mainly non productive, encouraged deep breath and cough.

## 2023-08-05 NOTE — Unmapped (Signed)
 Shift Summary   Temperature remained stable throughout the shift, indicating no signs of infection.    Bruising was noted during the skin integrity assessment, but no further skin issues were reported.    CHG treatment was administered regularly to maintain hygiene.    Pain management was addressed with PRN administration of magic mouthwash and lidocaine  for mouth pain.    Overall, the patient maintained stability with no falls or infection signs observed.     Absence of Infection Signs and Symptoms: Temperature remained stable throughout the shift, with no signs of fever or infection observed. Contact precautions were consistently maintained to prevent infection spread.     Absence of Fall and Fall-Related Injury: Fall risk interventions were effectively implemented, including regular toileting and hourly visual checks, with no falls or injuries reported.       Problem: Infection  Goal: Absence of Infection Signs and Symptoms  08/05/2023 1642 by Verner Goltz, RN  Outcome: Shift Focus  08/05/2023 1641 by Verner Goltz, RN  Outcome: Shift Focus  Intervention: Prevent or Manage Infection  Recent Flowsheet Documentation  Taken 08/05/2023 0800 by Verner Goltz, RN  Isolation Precautions: contact precautions maintained     Problem: Fall Injury Risk  Goal: Absence of Fall and Fall-Related Injury  Outcome: Shift Focus  Intervention: Promote Injury-Free Environment  Recent Flowsheet Documentation  Taken 08/05/2023 0800 by Verner Goltz, RN  Safety Interventions:   fall reduction program maintained   bed alarm

## 2023-08-05 NOTE — Unmapped (Signed)
 Shift Summary   Patient ambulated independently in the room, indicating stable mobility.    Chest expansion remained symmetrical throughout the shift, suggesting stable respiratory status.    No adverse reactions were noted during incentive spirometry treatment.    CAM assessment indicated no signs of delirium or altered consciousness.    Overall, the patient maintained stability with no falls or injuries, and mobility was independent.     Absence of Fall and Fall-Related Injury: Maintained safety with no falls or injuries; side rails were consistently at 2/4, and hourly visual checks showed the patient was either awake in a chair or asleep in bed.

## 2023-08-05 NOTE — Unmapped (Signed)
 General Pulmonary Team Follow Up Consult Note     Date of Service: 08/05/2023  Requesting Physician: Ejaz Arshad Janjua, DO   Requesting Service: Med Undesignated (MDX)  Reason for consultation: Comprehensive evaluation of bronchiectasis.    Hospital Problems:  Principal Problem:    Bronchiectasis    Active Problems:    Essential hypertension    Gastroesophageal reflux disease without esophagitis    Acute on chronic respiratory failure with hypoxemia      Sensory neuropathy      HPI: Jordan [name] is a 68 y.o. male with bronchiectasis, asthma/COPD overlap who presents for bronchiectasis exacerbation. Plan to complete abx while inpatient (due to coverage).     Problems addressed during this consult include bronchiectasis. Based on these problems, the patient has moderate risk of morbidity/mortality which is commensurate w their risk of management options described below in the recommendations.    Assessment      Interval Events:   No further episodes of hemoptysis, not making sputum for several days.       Impression: The patient has made some clinical progress since last encounter. I personally reviewed most recent pertinent labs, imaging and micro data, from 08/05/2023, which are noted in recommendations..        Recommendations     Antibiotics:  Based on prior culture data (OPF, PsA) and allergy profile, favor treating with:  IV ceftazidime   Oral levofloxacin   Inhaled colistin  LRCX +pseudomonas  S/p PICC line placement for IV antibiotics  Anticipated ED of abx is 08/06/23 - plan to complete inpatient tomorrow and discharge home   Continue chronic azithromycin  for anti-inflammatory effect.     Other medications:  Prednisone  40 mg daily x 5 days. Taper by 10 mg every 5 days. Have decreased to prednisone  30 mg (tomorrow 5/16 day 5)  Convert Trelegy to formulary equivalent.  May change to Trelegy 200 after discharge.  COVID vaccine prior to discharge     Airway clearance: Needs aggressive airway clearance 4 times per day.  Nebulized hypertonic saline 10%; pre-treatment with albuterol . - restart today  For mechanical clearance, prefer metaneb and Vibralung- patient using vest some as he likes the results  Pulmozyme  2.5 mg nebulized daily       Labs:  On admission, CBC-D, CMP, CRP, ESR.  Sputum for bacterial and AFB cultures (NTM screening) sent from clinic.   CRP and ESR every 4-5 days to trend (including as outpatient).  [facility] Guideline for Monitoring Outpatient Intravenous and Oral Antimicrobial Therapy recommended monitoring labs ([email or web address])     Imaging:  CXR 5/14 stable, no new infiltrate     Other Testing:  No repeat PFTs with hemoptysis    [facility]):  Patient is not a candidate for [facility] due to distance.     Dispo:   Patient will need follow-up appointment with bronchiectasis clinic in 2-3 months with FVL.  Patient does not need to remain in the hospital for the entire course of IV antibiotics.   Please reach out to [facility]ctasis/[facility] team Leota Randy RN, [place] Levels CPP) prior to discharge to ensure that home infusion orders are correct and that we are comfortable with patient discharging home on IVs at that time.  We will follow monitoring labs.  Please use DOTPULMIVDISCHARGE for home infusion orders. We will follow monitoring labs. Does not need OPAT.  Do not discharge Friday through Sunday or on a holiday as no outpatient clinical staff available  to assist if problems arise with home health/home infusion.       Ellie Gutta, MD    Subjective & Objective     Vitals - past 24 hours  Temp:  [36.4 ??C (97.5 ??F)-36.9 ??C (98.4 ??F)] 36.4 ??C (97.5 ??F)  Pulse:  [80-106] 80  Resp:  [18-19] 18  BP: (119-134)/(74-82) 119/80  SpO2:  [92 %-97 %] 97 % Intake/Output  I/O last 3 completed shifts:  In: 850 [P.O.:850]  Out: -       Pertinent exam findings:   General appearance - today appears to feel improved, sitting at side of bed  Heart - regular rate and rhythm, normal S1/S2, no gallops, rubs, or murmurs  Chest - stable rhonchi at base left lung, right lung stable decreased BS  Neuro: AAOx3  ENT: MMM, wearing nasal cannula  LE: 1+ LE edema, L>R       Relevant Imaging:  - CXR on 07/23/23 revealed Right apical pleural thickening with scarring and volume loss in the right lung apex. Elevated right hemidiaphragm. Extensive bronchiectasis and bronchial wall thickening bilaterally.     Cultures:  Lower Respiratory Culture (no units)   Date Value   07/23/2023 4+ Mucoid Pseudomonas aeruginosa (A)   07/23/2023 3+ Oropharyngeal Flora Isolated   07/23/2023 <1+ Smooth Pseudomonas aeruginosa (A)     WBC (10*9/L)   Date Value   08/02/2023 14.6 (H)          Other Labs:  Lab Results   Component Value Date    WBC 14.6 (H) 08/02/2023    HGB 12.9 08/02/2023    HCT 38.3 (L) 08/02/2023    PLT 174 08/02/2023     Lab Results   Component Value Date    NA 142 08/03/2023    K 4.0 08/03/2023    CL 97 (L) 08/03/2023    CO2 34.4 (H) 08/03/2023    BUN 34 (H) 08/03/2023    CREATININE 1.02 08/03/2023    GLU 88 08/03/2023    CALCIUM  9.8 08/03/2023    MG 1.5 (L) 12/22/2021    PHOS 3.7 01/22/2016     Lab Results   Component Value Date    BILITOT 0.2 (L) 07/23/2023    BILIDIR 0.40 04/02/2015    PROT 7.8 07/23/2023    ALBUMIN 3.7 07/23/2023    ALT 16 07/23/2023    AST 18 07/23/2023    ALKPHOS 96 07/23/2023    GGT 64 06/21/2013     Lab Results   Component Value Date    LABPROT 10.4 08/03/2013    INR 0.95 04/14/2017    APTT 32.6 07/25/2014       Allergies & Home Medications   Personally reviewed in Epic    Continuous Infusions:       Scheduled Medications:    albuterol   2.5 mg Nebulization 4x Daily (RT)    ascorbic acid  (vitamin C )  1,000 mg Oral Daily    atorvastatin   10 mg Oral Daily azithromycin   250 mg Oral Daily    azithromycin   250 mg Oral Daily    cefTAZidime   2 g Intravenous Q8H SCH    cholecalciferol  (vitamin D3 25 mcg (1,000 units))  50 mcg Oral Daily    colistimethate  (COLYMYCIN) 150 mg in sodium chloride  (NS) 4 mL inhalation  150 mg Inhalation Q12H SCH    dilTIAZem   120 mg Oral Daily    dornase alfa   2.5 mg Inhalation Daily (RT)    ferrous sulfate   325 mg  Oral BID with meals    fluticasone  furoate-vilanterol  1 puff Inhalation Daily (RT)    And    umeclidinium  1 puff Inhalation Daily (RT)    HYDROcodone -acetaminophen   7.5 mg of hydrocodone  Oral TID    montelukast   10 mg Oral Nightly    multivitamins (ADULT)  1 tablet Oral Daily    pantoprazole   20 mg Oral Daily before breakfast    predniSONE   30 mg Oral Daily    sodium chloride   10 mL Intravenous Q8H    sodium chloride   5 mL Nebulization 4x Daily (RT)       PRN medications:  guaiFENesin , magic mouthwash oral **AND** lidocaine , ondansetron  **OR** ondansetron 

## 2023-08-05 NOTE — Unmapped (Signed)
 Daily Progress Note    Assessment/Plan:    Principal Problem:    Bronchiectasis    Active Problems:    Essential hypertension    Gastroesophageal reflux disease without esophagitis    Acute on chronic respiratory failure with hypoxemia      Sensory neuropathy                 Jordan [name] is a 68 y.o. male who is presenting to [facility] with Bronchiectasis, in the setting of the following pertinent/contributing co-morbidities: idiopathic bronchiectasis with chronic pseudomonas colonization, right middle lobe lobectomy and right lower lobe wedge resection chronic hypoxemia on 2 liters of O2 at home, HTN, chronic peripheral neuropathy/lumbosacral radiculopathy on chronic norco  at home, osteoporosis on yearly zoledronic  acid .     bronchiectasis with exacerbation:  continue IV ceftazidime , po levaquin , inhaled colistin.  His sputum cx is c/w with pseudomonas (chronically colonized).  Airway clearance with metaneb/volera, hypertonic saline 10% nebs with QID with albuterol  pre-treatment.  Continue dornase and formulary equivalent of home inhalers.  Continue montelukast  and chronic azithromycin .  Prednisone  dose reduced again to 30 mg po daily yesterday.  D/w pulm and suspect the decrease in coughing is actually clinical improvement.  This is supported by reduction in his o2 requirements (was on 6 liters when he arrived, now on 3 liters).  He uses 2 liters [place] at home but has reportedly told others it is 2 to 4 liters.  He needs antibiotics through 5/16 and cost is prohibiting home infusions at this time.  Anticipate dc home tomorrow.      asthma with allergic component:  on dupixent  at home, pulmonology working on approval for [facility]  outpatient.      paroxysmal SVT:  follows with [facility] cardiology, clinically stable on diltiazem .      Chronic pain - due to lumbosacral radiculopathy - on home norco .      osteoporosis:  on yearly zoledronic  acid with last dose 02/2023.  Due for next dose 02/2024    Bilateral pedal edema:  likely due to prednisone  as does not have it at baseline and usually occurs when on steroids.  Encouraged elevating his lower extremities.  Anticipate this will improve once steroids are further weaned and eventually discontinued.     DVT PPX:  ambulatory     Disposition:  dc home 08/06/2023   ___________________________________________________________________    Subjective:  No acute events overnight.      No results found for this or any previous visit (from the past 24 hours).    Labs/Studies:  Labs and Studies from the last 24hrs per EMR and Reviewed    Objective:  Temp:  [36.4 ??C (97.5 ??F)-36.9 ??C (98.4 ??F)] 36.4 ??C (97.5 ??F)  Pulse:  [80-106] 80  Resp:  [18-19] 18  BP: (119-134)/(74-82) 119/80  SpO2:  [92 %-97 %] 97 %    General - seated in chair, awake, no apparent distress  CV - RRR, no murmurs; b/l +2 pitting pedal edema   Pulm - faint rhonci b/l, could not appreciate wheezing

## 2023-08-06 LAB — C-REACTIVE PROTEIN: C-REACTIVE PROTEIN: <5 mg/L (ref <=10.0)

## 2023-08-06 LAB — SEDIMENTATION RATE: ERYTHROCYTE SEDIMENTATION RATE: 15 mm/h (ref 0–20)

## 2023-08-06 MED ORDER — DUPIXENT 200 MG/1.14 ML SUBCUTANEOUS SYRINGE
SUBCUTANEOUS | 11 refills | 28.00000 days | Status: SS
Start: 2023-08-06 — End: 2024-08-05

## 2023-08-06 MED ORDER — PREDNISONE 20 MG TABLET
ORAL_TABLET | ORAL | 0 refills | 10.00000 days | Status: CP
Start: 2023-08-06 — End: 2023-08-16

## 2023-08-06 MED ADMIN — dornase alfa (PULMOZYME) 1 mg/mL solution 2.5 mg: 2.5 mg | RESPIRATORY_TRACT | @ 14:00:00 | Stop: 2023-08-06

## 2023-08-06 MED ADMIN — dilTIAZem (CARDIZEM CD) 24 hr capsule 120 mg: 120 mg | ORAL | @ 13:00:00 | Stop: 2023-08-06

## 2023-08-06 MED ADMIN — cefTAZidime (FORTAZ) 2 g in sodium chloride 0.9 % (NS) 100 mL IVPB-MBP: 2 g | INTRAVENOUS | @ 10:00:00 | Stop: 2023-08-06

## 2023-08-06 MED ADMIN — HYDROcodone-acetaminophen (HYCET) 7.5-325 mg/15 mL solution 7.5 mg of hydrocodone: 7.5 mg | ORAL | @ 16:00:00 | Stop: 2023-08-06

## 2023-08-06 MED ADMIN — colistimethate (COLYMYCIN) 150 mg in sodium chloride (NS) 4 mL inhalation: 150 mg | RESPIRATORY_TRACT

## 2023-08-06 MED ADMIN — cefTAZidime (FORTAZ) 2 g in sodium chloride 0.9 % (NS) 100 mL IVPB-MBP: 2 g | INTRAVENOUS | @ 16:00:00 | Stop: 2023-08-06

## 2023-08-06 MED ADMIN — levoFLOXacin (LEVAQUIN) tablet 750 mg: 750 mg | ORAL | @ 10:00:00 | Stop: 2023-08-06

## 2023-08-06 MED ADMIN — azithromycin (ZITHROMAX) tablet 250 mg: 250 mg | ORAL | @ 13:00:00 | Stop: 2023-08-06

## 2023-08-06 MED ADMIN — multivitamins, therapeutic with minerals tablet 1 tablet: 1 | ORAL | @ 13:00:00 | Stop: 2023-08-06

## 2023-08-06 MED ADMIN — umeclidinium (INCRUSE ELLIPTA) 62.5 mcg/actuation inhaler 1 puff: 1 | RESPIRATORY_TRACT | @ 13:00:00 | Stop: 2023-08-06

## 2023-08-06 MED ADMIN — montelukast (SINGULAIR) tablet 10 mg: 10 mg | ORAL | @ 02:00:00

## 2023-08-06 MED ADMIN — ferrous sulfate tablet 325 mg: 325 mg | ORAL | @ 13:00:00 | Stop: 2023-08-06

## 2023-08-06 MED ADMIN — cholecalciferol (vitamin D3 25 mcg (1,000 units)) tablet 50 mcg: 50 ug | ORAL | @ 13:00:00 | Stop: 2023-08-06

## 2023-08-06 MED ADMIN — sodium chloride (NS) 0.9 % flush 10 mL: 10 mL | INTRAVENOUS | @ 17:00:00 | Stop: 2023-08-06

## 2023-08-06 MED ADMIN — HYDROcodone-acetaminophen (HYCET) 7.5-325 mg/15 mL solution 7.5 mg of hydrocodone: 7.5 mg | ORAL | @ 10:00:00 | Stop: 2023-08-06

## 2023-08-06 MED ADMIN — cefTAZidime (FORTAZ) 2 g in sodium chloride 0.9 % (NS) 100 mL IVPB-MBP: 2 g | INTRAVENOUS | @ 02:00:00 | Stop: 2023-08-06

## 2023-08-06 MED ADMIN — atorvastatin (LIPITOR) tablet 10 mg: 10 mg | ORAL | @ 02:00:00

## 2023-08-06 MED ADMIN — albuterol 2.5 mg /3 mL (0.083 %) nebulizer solution 2.5 mg: 2.5 mg | RESPIRATORY_TRACT | @ 16:00:00 | Stop: 2023-08-06

## 2023-08-06 MED ADMIN — magic mouthwash oral suspension (diphenhydrAMINE, nystatin) 10 mL: 10 mL | ORAL | @ 17:00:00 | Stop: 2023-08-06

## 2023-08-06 MED ADMIN — sodium chloride 10 % NEBULIZER solution 5 mL: 5 mL | RESPIRATORY_TRACT | @ 16:00:00 | Stop: 2023-08-06

## 2023-08-06 MED ADMIN — albuterol 2.5 mg /3 mL (0.083 %) nebulizer solution 2.5 mg: 2.5 mg | RESPIRATORY_TRACT | @ 13:00:00 | Stop: 2023-08-06

## 2023-08-06 MED ADMIN — predniSONE (DELTASONE) tablet 30 mg: 30 mg | ORAL | @ 13:00:00 | Stop: 2023-08-06

## 2023-08-06 MED ADMIN — fluticasone furoate-vilanterol (BREO ELLIPTA) 100-25 mcg/dose inhaler 1 puff: 1 | RESPIRATORY_TRACT | @ 13:00:00 | Stop: 2023-08-06

## 2023-08-06 MED ADMIN — sodium chloride (NS) 0.9 % flush 10 mL: 10 mL | INTRAVENOUS | @ 10:00:00 | Stop: 2023-08-06

## 2023-08-06 MED ADMIN — colistimethate (COLYMYCIN) 150 mg in sodium chloride (NS) 4 mL inhalation: 150 mg | RESPIRATORY_TRACT | @ 13:00:00 | Stop: 2023-08-06

## 2023-08-06 MED ADMIN — pantoprazole (Protonix) EC tablet 20 mg: 20 mg | ORAL | @ 13:00:00 | Stop: 2023-08-06

## 2023-08-06 MED ADMIN — sodium chloride 10 % NEBULIZER solution 5 mL: 5 mL | RESPIRATORY_TRACT | @ 13:00:00 | Stop: 2023-08-06

## 2023-08-06 MED ADMIN — ascorbic acid (vitamin C) (VITAMIN C) tablet 1,000 mg: 1000 mg | ORAL | @ 13:00:00 | Stop: 2023-08-06

## 2023-08-06 NOTE — Unmapped (Signed)
 PICC LINE REMOVAL PROCEDURE NOTE    Proper PPE were utilized.  PICC line was removed per protocol.  Hemostasis was achieved.  Patient tolerated procedure well.  Gauze and transparent dressing placed.  Verbal and written site care instructions were provided.  Patient verbalized understanding.      PICC tip intact.  Measurement upon removal corresponds with insertion record      Thank You,    [place] Zailee Vallely, BSN, VA-BC  [facility]     Workup Time:  15 minutes

## 2023-08-06 NOTE — Unmapped (Signed)
 Physician Discharge Summary [facility]BR  4 BT1 [facility]  [address]  [place] [address]  Dept: [phone]  Loc: [phone]     Identifying Information:   Jordan [name]  September 08, 1955  [id number]    Primary Care Physician: Donnis Galeazzi, MD   Code Status: Full Code    Admit Date: 07/23/2023    Discharge Date: 08/06/2023     Discharge To: Home    Discharge Service: HBR - HBB: Hospitalist Service #1     Discharge Attending Physician: Vanessa General, MD    Discharge Diagnoses:  Principal Problem:    Bronchiectasis   (POA: Yes)  Active Problems:    Essential hypertension (POA: Yes)    Gastroesophageal reflux disease without esophagitis (POA: Yes)    Acute on chronic respiratory failure with hypoxemia   (POA: Yes)    Sensory neuropathy (POA: Yes)  Resolved Problems:    * No resolved hospital problems. *      Outpatient Provider Follow Up Issues:   [ ]  Patient will follow up with his pulmonologist as scheduled (Dr. Sharion Davidson)    Hospital Course:   Jordan [name] is a 68 y.o. man who is presenting to [facility] with a bronchiectasis exacerbation.  The patient has idiopathic bronchiectasis with chronic pseudomonas colonization, right middle lobe lobectomy and right lower lobe wedge resection with prior massive hemoptysis, chronic hypoxemia on 2 to 4  liters of O2 at home, HTN, chronic peripheral neuropathy/lumbosacral radiculopathy on chronic norco  at home, osteoporosis on yearly zoledronic  acid, here with management of worsening cough with sputum production, and shortness of breath.  He was admitted directly from his pulmonologist's office for a bronchiectasis exacerbation.  Upon arrival here, the patient was noted to be on 6 liters of [place].    Treatment for bronchiectasis ensued and included the following based off pulmonology recs:    Antibiotics:  Given chronic colonization with Pseudomonas, was started on ceftazidime  and levofloxacin .  Patient completed therapy with antibiotics on 08/06/2023.  He remained inpatient for antibiotics as he was unable to afford the co-pay associated with home infusions.  Antibiotics were given through a picc line.  PICC line was removed on the day of discharge.    Airway Clearance:  was maintained on mechanical airway clearance with chest vest and metaneb/volera.  Was also on 10% hypertonic saline nebs with albuterol  pretreatment.  Patient reported improvement in his symptoms. Oxygen requirements came back down to 2-4 liters which is his baseline.    Other medications:  was on Trelegy at home and was converted to breo ellipta  and incruse; was also on home dose of dornase.  Started him on prednisone  per pulmonology recs.  Was on 40 mg po daily x5 days and then tapered by 10 mg every 5 days until he finished his course.  Taper look longer at first because he subjectively didn't improve in terms of symptoms so was kept on 40 mg po daily for longer than 5 days but then was back on schedule for the taper.  Was sent home with a taper.  Also has a component of allergic asthma and is on dupixent  at home which he could resume upon discharge.  Was continued on his home prophylactic dose of azithromycin .    The patient has known osteoporosis and received zoledronic  acid yearly with the last dose being 02/2023.  Follows up with Endocrinology outpatient.    He has a history of HTN with paroxysmal SVT and follows with [facility].  Was continued on his home dose of diltiazem .    Chronic back pain from lumbosacral radiculopathy - he was managed with his home norco  dose and this was not an active issue during his hospitalization.    The patient did have pedal edema bilaterally which usually occurs whenever he takes prednisone .  Advised him to keep his extremities elevated and this will likely get better once steroids are further tapered and eventually discontinued.  He was not otherwise volume overloaded clinically.    Procedures:  PICC line  No admission procedures for hospital encounter.  ______________________________________________________________________  Discharge Medications:     Your Medication List        START taking these medications      predniSONE  20 MG tablet  Commonly known as: DELTASONE   Take 1 tablet (20 mg total) by mouth daily for 5 days, THEN 0.5 tablets (10 mg total) daily for 5 days.  Start taking on: Aug 06, 2023            CHANGE how you take these medications      DUPIXENT  SYRINGE 200 mg/1.14 mL syringe  Generic drug: dupilumab   Inject the contents of 1 syringe (200 mg total) under the skin every fourteen (14) days.  What changed: Another medication with the same name was removed. Continue taking this medication, and follow the directions you see here.            CONTINUE taking these medications      albuterol  90 mcg/actuation inhaler  Commonly known as: PROVENTIL  HFA;VENTOLIN  HFA  Inhale 2 puffs four (4) times a day as needed.     albuterol  2.5 mg /3 mL (0.083 %) nebulizer solution  INHALE 3 ML BY NEBULIZATION THREE (3) TIMES A DAY.     ALCOHOL  PREP PADS Padm  Generic drug: alcohol  swabs   Use as directed with inhaled antibiotics     ascorbic acid  (vitamin C ) 1000 MG tablet  Commonly known as: VITAMIN C   Take 1 tablet (1,000 mg total) by mouth daily.     atorvastatin  10 MG tablet  Commonly known as: LIPITOR   Take 1 tablet (10 mg total) by mouth daily.     azithromycin  250 MG tablet  Commonly known as: ZITHROMAX   TAKE 1 TABLET BY MOUTH EVERY DAY     BD POSIFLUSH NORMAL SALINE 0.9 injection  Generic drug: sodium chloride   Inject 2mL of 0.9%NaCl into colistin vial & gently mix. After withdrawing colistin dose, add an additional 1mL of 0.9%NaCl to neb cup with the colistin dose.     BD REGULAR BEVEL NEEDLES 21 gauge x 1 1/2 Ndle  Generic drug: needle (disp) 21 G  Use as directed with inhaled Colistin     cholecalciferol  (vitamin D3-50 mcg (2,000 unit)) 50 mcg (2,000 unit) tablet  Take 1 tablet (50 mcg total) by mouth daily.     colistimethate  150 mg injection  Commonly known as: COLYMYCIN  Inject 2 mL sterile water  for injection to mix colistin vial, then draw up 2 mL (150mg ) and inhale 2 times a day, 28 days on and 28 days off.     dilTIAZem  120 MG 24 hr capsule  Commonly known as: CARDIZEM  CD  Take 1 capsule (120 mg total) by mouth daily.     empty container Misc  USE AS DIRECTED     empty container Misc  Commonly known as: sharps container  USE AS DIRECTED     empty container Misc  Use as directed to dispose of  needles. When full, make sure lid is closed tightly then dispose of container in trash.     EPINEPHrine  0.3 mg/0.3 mL injection  Commonly known as: EPIPEN   Inject 0.3 mL (0.3 mg total) into the muscle once as needed for anaphylaxis (may repeat if needed) for up to 2 doses.     ferrous sulfate  325 (65 FE) MG tablet  Take 1 tablet (325 mg total) by mouth Two (2) times a day. BID     fluticasone  propionate 50 mcg/actuation nasal spray  Commonly known as: FLONASE   1 spray into each nostril daily.     HYDROcodone -acetaminophen  7.5-325 mg per tablet  Commonly known as: NORCO   Take 1 tablet by mouth three (3) times a day (at 6am, noon and 6pm).     LC PLUS Misc  Generic drug: nebulizers  Use with inhaled medications     LC PLUS Misc  Generic drug: nebulizers  use with nebulized medications     LC PLUS Misc  Generic drug: nebulizers  Use as directed     montelukast  10 mg tablet  Commonly known as: SINGULAIR   TAKE 1 TABLET BY MOUTH EVERY DAY AT NIGHT     multivitamin-minerals-lutein Tab  Take 1 tablet by mouth daily.     NON FORMULARY  Take 650 mg by mouth two (2) times a day. Brand: Citracal 650mg      omeprazole 20 MG capsule  Commonly known as: PriLOSEC  Take 1 capsule (20 mg total) by mouth Two (2) times a day.     OXYGEN-AIR DELIVERY SYSTEMS MISC  Inhale 2 mL.     PULMOZYME  1 mg/mL nebulizer solution  Generic drug: dornase alfa   Inhale 1 ampule (2.5 mg) via nebulizer daily. Use at least 30-60 minutes before airway clearance, or after airway clearance. sodium chloride  10 % Nebu  Measure and Inhale 5 mL by nebulization Two (2) times a day. Discard remaining amount and use a new vial for each dose.     sterile water  Soln  Use 2mL to mix Colistin, then add additional 2mL to neb cup with 2mL of mixed colistin. Inhale twice daily 28 days on and 28 days off.     syringe with needle 3 mL 20 gauge x 1 1/2 Syrg  Commonly known as: BD LUER-LOK SYRINGE  For use with inhaled antibiotic (Colistin)     BD LUER-LOK SYRINGE 3 mL 21 gauge x 1 Syrg  Generic drug: syringe with needle  For use with inhaled antibiotic (Colistin)     tobramycin  (PF) 300 mg/5 mL nebulizer solution  Commonly known as: [name]   Inhale the contents of 1 ampule (300 mg total) by nebulization every twelve (12) hours. 28 days on and 28 days off.     TRELEGY ELLIPTA  100-62.5-25 mcg inhaler  Generic drug: fluticasone -umeclidin-vilanter  Inhale 1 puff daily.     zinc  gluconate 50 mg (7 mg elemental zinc ) tablet  Take 1 tablet (50 mg total) by mouth.     zoledronic  acid-mannitol &water  5 mg/100 mL Pgbk  Commonly known as: RECLAST   Infusion              Allergies:  Gentamicin, Tobramycin , Mirtazapine , and Morphine  ______________________________________________________________________  Pending Test Results (if blank, then none):      Most Recent Labs:  All lab results last 24 hours -   Recent Results (from the past 24 hours)   C-reactive protein    Collection Time: 08/06/23  6:33 AM   Result Value Ref Range  CRP <5.0 <=10.0 mg/L   Sedimentation Rate    Collection Time: 08/06/23  6:33 AM   Result Value Ref Range    Sed Rate 15 0 - 20 mm/h     Lab Results   Component Value Date    WBC 14.6 (H) 08/02/2023    HGB 12.9 08/02/2023    HCT 38.3 (L) 08/02/2023    PLT 174 08/02/2023       Lab Results   Component Value Date    NA 142 08/03/2023    K 4.0 08/03/2023    CL 97 (L) 08/03/2023    CO2 34.4 (H) 08/03/2023    BUN 34 (H) 08/03/2023    CREATININE 1.02 08/03/2023    GLU 88 08/03/2023    CALCIUM  9.8 08/03/2023    MG 1.5 (L) 12/22/2021    PHOS 3.7 01/22/2016       Lab Results   Component Value Date    BILITOT 0.2 (L) 07/23/2023    BILIDIR 0.40 04/02/2015    PROT 7.8 07/23/2023    ALBUMIN 3.7 07/23/2023    ALT 16 07/23/2023    AST 18 07/23/2023    ALKPHOS 96 07/23/2023    GGT 64 06/21/2013       Lab Results   Component Value Date    PT 10.8 04/14/2017    INR 0.95 04/14/2017    APTT 32.6 07/25/2014         Relevant Studies/Radiology (if blank, then none):  XR Chest 2 views  Result Date: 08/04/2023  EXAM: XR CHEST 2 VIEWS ACCESSION: [id number] UN REPORT DATE: 08/04/2023 9:58 AM CLINICAL INDICATION: COUGH  TECHNIQUE: PA and Lateral Chest Radiographs COMPARISON: Chest 07/23/2023 FINDINGS: Right lower severely hypoinflated. Right apical pleural thickening with scarring and volume loss in the right lung apex. Elevated right hemidiaphragm. Extensive bronchiectasis and bronchial wall thickening bilaterally. No new consolidation. No pneumothorax. Partially obscured cardiomediastinal silhouette.     Right apical pleural thickening with scarring and volume loss in the right lung apex. Elevated right hemidiaphragm. Extensive bronchiectasis and bronchial wall thickening bilaterally.     XR Chest Portable  Result Date: 07/23/2023  EXAM: XR CHEST PORTABLE ACCESSION: [id number] UN REPORT DATE: 07/23/2023 9:47 PM CLINICAL INDICATION: PNEUMONIA  TECHNIQUE: Single View AP Chest Radiograph. COMPARISON: 01/26/2023 FINDINGS: Lungs severely hypoinflated. Right apical pleural thickening with scarring and volume loss in the right lung apex. Elevated right hemidiaphragm. Extensive bronchiectasis and bronchial wall thickening bilaterally. No new consolidation. No pneumothorax. Partially obscured cardiomediastinal silhouette.     Right apical pleural thickening with scarring and volume loss in the right lung apex. Elevated right hemidiaphragm. Extensive bronchiectasis and bronchial wall thickening bilaterally.     ECG 12 Lead  Result Date: 07/23/2023  NORMAL SINUS RHYTHM INCOMPLETE RIGHT BUNDLE BRANCH BLOCK LEFT ANTERIOR FASCICULAR BLOCK SEPTAL INFARCT  , AGE UNDETERMINED ABNORMAL ECG WHEN COMPARED WITH ECG OF 26-Jan-2023 15:23, LEFT ANTERIOR FASCICULAR BLOCK IS NOW PRESENT INCOMPLETE RIGHT BUNDLE BRANCH BLOCK IS NOW PRESENT SEPTAL INFARCT  IS NOW PRESENT   ______________________________________________________________________  Discharge Instructions:       ______________________________________________________________________  Discharge Day Services:  BP 142/71  - Pulse 99  - Temp 36.3 ??C (97.4 ??F) (Temporal)  - Resp 18  - Ht 169.7 cm (5' 6.8)  - Wt 80.7 kg (178 lb)  - SpO2 95%  - BMI 28.05 kg/m??   Pt seen on the day of discharge and determined appropriate for discharge.    Condition at Discharge: stable    Length of  Discharge: I spent greater than 30 mins in the discharge of this patient.

## 2023-08-06 NOTE — Unmapped (Signed)
 General Pulmonary Team Follow Up Consult Note     Date of Service: 08/06/2023  Requesting Physician: Vanessa General, MD   Requesting Service: Med Undesignated (MDX)  Reason for consultation: Comprehensive evaluation of bronchiectasis.    Hospital Problems:  Principal Problem:    Bronchiectasis    Active Problems:    Essential hypertension    Gastroesophageal reflux disease without esophagitis    Acute on chronic respiratory failure with hypoxemia      Sensory neuropathy      HPI: Jordan [name] is a 68 y.o. male with bronchiectasis, asthma/COPD overlap who presents for bronchiectasis exacerbation. Plan to complete abx while inpatient (due to coverage).     Problems addressed during this consult include bronchiectasis. Based on these problems, the patient has moderate risk of morbidity/mortality which is commensurate w their risk of management options described below in the recommendations.    Assessment      Interval Events:   No further episodes of hemoptysis, not making sputum for several days. Able to restart his 10% last night. Plans for discharge today.       Impression: The patient has made some clinical progress since last encounter. I personally reviewed most recent pertinent labs, imaging and micro data, from 08/06/2023, which are noted in recommendations..        Recommendations     Antibiotics:  Completed treatment with:  IV ceftazidime   Oral levofloxacin   Inhaled colistin  LRCX +pseudomonas    Anticipated ED of abx is 08/06/23 - plan to complete today and then discharge home  Continue chronic azithromycin  for anti-inflammatory effect.     Other medications:  Prednisone  40 mg daily x 5 days. Taper by 10 mg [name] 5 days. Have decreased to prednisone  30 mg (today 5/16 is day 5)  Convert Trelegy to formulary equivalent.  May change to Trelegy 200 after discharge.  COVID vaccine prior to discharge     Airway clearance: Needs aggressive airway clearance 4 times per day.  Nebulized hypertonic saline 10%; pre-treatment with albuterol . - restart today  For mechanical clearance, prefer metaneb and Vibralung- patient using vest some as he likes the results  Pulmozyme  2.5 mg nebulized daily       Labs:  On admission, CBC-D, CMP, CRP, ESR.  Sputum for bacterial and AFB cultures (NTM screening) sent from clinic.   CRP and ESR [name] 4-5 days to trend (including as outpatient).  [facility] Guideline for Monitoring Outpatient Intravenous and Oral Antimicrobial Therapy recommended monitoring labs ([email or web address]     Imaging:  CXR 5/14 stable, no new infiltrate     Other Testing:  No repeat PFTs with hemoptysis    [facility]):  Patient is not a candidate for [facility] due to distance.     Dispo:   Patient will need follow-up appointment with bronchiectasis clinic in 2-3 months with FVL.  Patient does not need to remain in the hospital for the entire course of IV antibiotics.   Please reach out to [facility] team Leota Randy RN, [place] Levels CPP) prior to discharge to ensure that home infusion orders are correct and that we are comfortable with patient discharging home on IVs at that time.  We will follow monitoring labs.  Please use [email or web address] for home infusion orders. We will follow monitoring labs. Does not need OPAT.  Do not discharge Friday through Sunday or on a holiday as no outpatient clinical staff available to assist if problems arise  with home health/home infusion.       Ellie Gutta, MD    Subjective & Objective     Vitals - past 24 hours  Temp:  [36.3 ??C (97.4 ??F)-37 ??C (98.6 ??F)] 36.3 ??C (97.4 ??F)  Pulse:  [94-102] 99  Resp:  [18-22] 18  BP: (124-142)/(71-78) 142/71  SpO2:  [91 %-98 %] 95 % Intake/Output  No intake/output data recorded.      Pertinent exam findings:   General appearance - today appears to feel improved, sitting at side of bed  Heart - regular rate and rhythm, normal S1/S2, no gallops, rubs, or murmurs  Chest - stable rhonchi at base left lung, right lung stable decreased BS  Neuro: AAOx3  ENT: MMM, wearing nasal cannula  LE: 1+ LE edema, L>R       Relevant Imaging:  - CXR on 07/23/23 revealed Right apical pleural thickening with scarring and volume loss in the right lung apex. Elevated right hemidiaphragm. Extensive bronchiectasis and bronchial wall thickening bilaterally.     Cultures:  Lower Respiratory Culture (no units)   Date Value   07/23/2023 4+ Mucoid Pseudomonas aeruginosa (A)   07/23/2023 3+ Oropharyngeal Flora Isolated   07/23/2023 <1+ Smooth Pseudomonas aeruginosa (A)     WBC (10*9/L)   Date Value   08/02/2023 14.6 (H)          Other Labs:  Lab Results   Component Value Date    WBC 14.6 (H) 08/02/2023    HGB 12.9 08/02/2023    HCT 38.3 (L) 08/02/2023    PLT 174 08/02/2023     Lab Results   Component Value Date    NA 142 08/03/2023    K 4.0 08/03/2023    CL 97 (L) 08/03/2023    CO2 34.4 (H) 08/03/2023    BUN 34 (H) 08/03/2023    CREATININE 1.02 08/03/2023    GLU 88 08/03/2023    CALCIUM  9.8 08/03/2023    MG 1.5 (L) 12/22/2021    PHOS 3.7 01/22/2016     Lab Results   Component Value Date    BILITOT 0.2 (L) 07/23/2023    BILIDIR 0.40 04/02/2015    PROT 7.8 07/23/2023    ALBUMIN 3.7 07/23/2023    ALT 16 07/23/2023    AST 18 07/23/2023    ALKPHOS 96 07/23/2023    GGT 64 06/21/2013     Lab Results   Component Value Date    LABPROT 10.4 08/03/2013    INR 0.95 04/14/2017    APTT 32.6 07/25/2014       Allergies & Home Medications   Personally reviewed in Epic    Continuous Infusions:       Scheduled Medications:    albuterol   2.5 mg Nebulization 4x Daily (RT)    ascorbic acid  (vitamin C )  1,000 mg Oral Daily    atorvastatin   10 mg Oral Daily    azithromycin   250 mg Oral Daily    cefTAZidime   2 g Intravenous Q8H SCH cholecalciferol  (vitamin D3 25 mcg (1,000 units))  50 mcg Oral Daily    colistimethate  (COLYMYCIN) 150 mg in sodium chloride  (NS) 4 mL inhalation  150 mg Inhalation Q12H [facility]    dilTIAZem   120 mg Oral Daily    dornase alfa   2.5 mg Inhalation Daily (RT)    ferrous sulfate   325 mg Oral BID with meals    fluticasone  furoate-vilanterol  1 puff Inhalation Daily (RT)    And    umeclidinium  1 puff Inhalation Daily (RT)    HYDROcodone -acetaminophen   7.5 mg of hydrocodone  Oral TID    montelukast   10 mg Oral Nightly    multivitamins (ADULT)  1 tablet Oral Daily    pantoprazole   20 mg Oral Daily before breakfast    sodium chloride   10 mL Intravenous Q8H    sodium chloride   5 mL Nebulization 4x Daily (RT)       PRN medications:  guaiFENesin , magic mouthwash oral **AND** lidocaine , ondansetron  **OR** ondansetron 

## 2023-08-07 NOTE — Unmapped (Signed)
 [facility] On Call Intervention    Mr.Levee paged the On Call Specialty Pharmacist on 08/07/2023 at 7:11PM regarding their medication CF/Pulmonary/Asthma: colistimethate  150mg  injection     Type of intervention: Missing delivery    Action taken: Patient did not receive needles, syringes, or sterile water  to take colistimethate . He has been in the hospital since the last shipment and did not realize he was missing supplies until today. I verified with the patient that he has extra needles and syringes and is only missing sterile water . I called and verified that the [facility] and [facility] do not have it in stock; all health system based pharmacies in his location are closed on Sunday. I discussed with the patient that he may go to [facility] to pick up sterile water . Patient reports that he is unable to make the commute and will wait until Monday for the supplies to take his next dose.     Follow-up needed: 5/19 - Need to send missing supplies (needles, syringes, sterile water )     Time spent: 60 minutes    This encounter has been routed to the patient's primary specialty pharmacist.      Amado Juneau  [facility] Pharmacist

## 2023-08-09 DIAGNOSIS — J479 Bronchiectasis, uncomplicated: Principal | ICD-10-CM

## 2023-08-09 DIAGNOSIS — J4550 Severe persistent asthma, uncomplicated: Principal | ICD-10-CM

## 2023-08-09 MED ORDER — SODIUM CHLORIDE 0.9 % (FLUSH) INJECTION SYRINGE
11 refills | 0.00000 days | Status: CN
Start: 2023-08-09 — End: ?

## 2023-08-09 MED ORDER — DUPIXENT 200 MG/1.14 ML SUBCUTANEOUS SYRINGE
Freq: Once | SUBCUTANEOUS | 0 refills | 0.00000 days | Status: CP
Start: 2023-08-09 — End: 2023-08-09
  Filled 2023-08-17: qty 2.28, 14d supply, fill #0

## 2023-08-09 MED FILL — BD POSIFLUSH NORMAL SALINE 0.9 % INJECTION SYRINGE: ORAL | 30 days supply | Qty: 180 | Fill #0

## 2023-08-09 MED FILL — BD REGULAR BEVEL NEEDLES 21 GAUGE X 1 1/2": 30 days supply | Qty: 60 | Fill #2

## 2023-08-09 MED FILL — ALCOHOL PREP PADS: 50 days supply | Qty: 100 | Fill #3

## 2023-08-09 MED FILL — LC PLUS MISC: 30 days supply | Qty: 1 | Fill #1

## 2023-08-09 MED FILL — EPINEPHRINE 0.3 MG/0.3 ML INJECTION, AUTO-INJECTOR: INTRAMUSCULAR | 2 days supply | Qty: 2 | Fill #0

## 2023-08-09 MED FILL — BD LUER-LOK SYRINGE 3 ML 21 GAUGE X 1": ORAL | 30 days supply | Qty: 60 | Fill #5

## 2023-08-09 NOTE — Unmapped (Unsigned)
 [facility]    Patient Onboarding/Medication Counseling    Jordan [name] is a 68 y.o. male with severe persistent asthma who I am counseling today on initiation of therapy.  I am speaking to the patient.    Was a Nurse, learning disability used for this call? No    Verified patient's date of birth / HIPAA.    Specialty medication(s) to be sent: CF/Pulmonary/Asthma: Dupixent       Non-specialty medications/supplies to be sent: see refill note      Medications not needed at this time: n/a       The patient declined counseling on medication administration because they will receive counseling in clinic. The information in the declined sections below are for informational purposes only and was not discussed with patient.       Dupixent  (dupilumab )    Medication & Administration     Dosage: Asthma, moderate to severe: Inject 400mg  under the skin as a loading dose followed by 200mg  every 14 days thereafter     Administration:     Dupixent  Syringe  1. Gather all supplies needed for injection on a clean, flat working surface: medication syringe removed from packaging, alcohol  swab, sharps container, etc.  2. Look at the medication label - look for correct medication, correct dose, and check the expiration date  3. Look at the medication - the liquid in the syringe should appear clear and colorless to pale yellow  4. Lay the syringe on a flat surface and allow it to warm up to room temperature for at least 45 minutes  5. Select injection site - you can use the front of your thigh or your belly (but not the area 2 inches around your belly button); if someone else is giving you the injection you can also use your upper arm in the skin covering your triceps muscle  6. Prepare injection site - wash your hands and clean the skin at the injection site with an alcohol  swab and let it air dry, do not touch the injection site again before the injection  7. Hold the middle of the body of the syringe and gently pull the needle safety cap straight out. Be careful not to bend the needle. Do not remove until immediately prior to injection  8. Pinch the skin - with your hand not holding the syringe pinch up a fold of skin at the injection site using your forefinger and thumb  9. Insert the needle into the fold of skin at about a 45 degree angle - it's best to use a quick dart-like motion - with the syringe in position, release the pinch of skin and allow the skin to relax  10. Push the plunger down slowly as far as it will go until the syringe is empty, if the plunger is not fully depressed the needle shield will not extend to cover the needle when it is removed  11. Check that the syringe is empty and keep pressing down on the plunger while you pull the needle out at the same angle as inserted; after the needle is removed completely from the skin, release the plunger allowing the needle shield to activate and cover the used needle  12. Dispose of the used syringe immediately in your sharps disposal container  13. If you see any blood at the injection site, press a cotton ball or gauze on the site and maintain pressure until the bleeding stops, do not rub the injection site    Adherence/Missed  dose instructions:  If a dose is missed, administer within 7 days from the missed dose and then resume the original schedule. If the missed dose is not administered within 7 days, you can either wait until the next dose on the original schedule or take your dose now and resume every 14 days from the new injection date. Do not use 2 doses at the same time or extra doses.      Goals of Therapy     -Reduce impairment - few night-time awakenings; maintenance of normal daily activities; optimization of lung function  -Minimal need (less than 2 days per week) of inhaled short acting beta agonists to relieve symptoms  -Prevention of recurrent exacerbations and need for emergency department/hospital care  -Prevention of reduced lung growth in children  -Maintenance of effective psychosocial functioning    Side Effects & Monitoring Parameters     Injection site reaction (redness, irritation, inflammation localized to the site of administration)  Signs of a common cold - minor sore throat, runny or stuffy nose, etc.  Recurrence of cold sores (herpes simplex)      The following side effects should be reported to the provider:  Signs of a hypersensitivity reaction - rash; hives; itching; red, swollen, blistered, or peeling skin; wheezing; tightness in the chest or throat; difficulty breathing, swallowing, or talking; swelling of the mouth, face, lips, tongue, or throat; etc.  Eye pain or irritation or any visual disturbances  Shortness of breath or worsening of breathing      Contraindications, Warnings, & Precautions     Have your bloodwork checked as you have been told by your prescriber   Birth control pills and other hormone-based birth control may not work as well to prevent pregnancy  Talk with your doctor if you are pregnant, planning to become pregnant, or breastfeeding  Discuss the possible need for holding your dose(s) of Dupixent ?? when a planned procedure is scheduled with the prescriber as it may delay healing/recovery timeline       Drug/Food Interactions     Medication list reviewed in Epic. The patient was instructed to inform the care team before taking any new medications or supplements. No drug interactions identified.   Talk with you prescriber or pharmacist before receiving any live vaccinations while taking this medication and after you stop taking it    Storage, Handling Precautions, & Disposal     Store this medication in the refrigerator.  Do not freeze  If needed, you may store at room temperature for up to 14 days  Store in original packaging, protected from light  Do not shake  Dispose of used syringes in a sharps disposal container      Current Medications (including OTC/herbals), Comorbidities and Allergies     Current Outpatient Medications   Medication Sig Dispense Refill    albuterol  2.5 mg /3 mL (0.083 %) nebulizer solution INHALE 3 ML BY NEBULIZATION THREE (3) TIMES A DAY. 825 mL 3    albuterol  HFA 90 mcg/actuation inhaler Inhale 2 puffs four (4) times a day as needed.      alcohol  swabs  (ALCOHOL  PREP PADS) PadM Use as directed with inhaled antibiotics 100 each 5    ascorbic acid , vitamin C , (VITAMIN C ) 1000 MG tablet Take 1 tablet (1,000 mg total) by mouth daily.      atorvastatin  (LIPITOR ) 10 MG tablet Take 1 tablet (10 mg total) by mouth daily.      azithromycin  (ZITHROMAX ) 250 MG tablet TAKE 1 TABLET  BY MOUTH EVERY DAY 90 tablet 3    cholecalciferol , vitamin D3-50 mcg, 2,000 unit,, 50 mcg (2,000 unit) tablet Take 1 tablet (50 mcg total) by mouth daily.      colistimethate  (COLYMYCIN) 150 mg injection Inject 2 mL sterile water  for injection to mix colistin vial, then draw up 2 mL (150mg ) and inhale 2 times a day, 28 days on and 28 days off. 60 each 5    dilTIAZem  (CARDIZEM  CD) 120 MG 24 hr capsule Take 1 capsule (120 mg total) by mouth daily. 90 capsule 3    dornase alfa  (PULMOZYME ) 1 mg/mL nebulizer solution Inhale 1 ampule (2.5 mg) via nebulizer daily. Use at least 30-60 minutes before airway clearance, or after airway clearance. 75 mL 11    dupilumab  (DUPIXENT  SYRINGE) 200 mg/1.14 mL syringe Inject the contents of 1 syringe (200 mg total) under the skin every fourteen (14) days. 2.3 mL 11    dupilumab  (DUPIXENT  SYRINGE) 200 mg/1.14 mL syringe Inject the contents of 2 syringes (400 mg total) under the skin once for 1 dose. 2.28 mL 0    empty container (SHARPS CONTAINER) Misc USE AS DIRECTED 1 each 0    empty container Misc USE AS DIRECTED 1 each 2    empty container Misc Use as directed to dispose of needles. When full, make sure lid is closed tightly then dispose of container in trash. 1 each 2    EPINEPHrine  (EPIPEN ) 0.3 mg/0.3 mL injection Inject 0.3 mL (0.3 mg total) into the muscle once as needed for anaphylaxis (may repeat if needed) for up to 2 doses. 2 each 0    ferrous sulfate  325 (65 FE) MG tablet Take 1 tablet (325 mg total) by mouth Two (2) times a day. BID      fluticasone  propionate (FLONASE ) 50 mcg/actuation nasal spray 1 spray into each nostril daily.      fluticasone -umeclidin-vilanter (TRELEGY ELLIPTA ) 100-62.5-25 mcg inhaler Inhale 1 puff daily. 90 each 3    HYDROcodone -acetaminophen  (NORCO ) 7.5-325 mg per tablet Take 1 tablet by mouth three (3) times a day (at 6am, noon and 6pm).      montelukast  (SINGULAIR ) 10 mg tablet TAKE 1 TABLET BY MOUTH EVERY DAY AT NIGHT 90 tablet 3    multivitamin-minerals-lutein Tab Take 1 tablet by mouth daily.      nebulizers (LC PLUS) Misc Use with inhaled medications 1 each 5    nebulizers (LC PLUS) Misc Use as directed 4 each 3    nebulizers Misc use with nebulized medications 12 each 0    needle, disp, 21 G (BD REGULAR BEVEL NEEDLES) 21 gauge x 1 1/2 Ndle Use as directed with inhaled Colistin 100 each 3    NON FORMULARY Take 650 mg by mouth two (2) times a day. Brand: Citracal 650mg       omeprazole (PRILOSEC) 20 MG capsule Take 1 capsule (20 mg total) by mouth Two (2) times a day.      OXYGEN-AIR DELIVERY SYSTEMS MISC Inhale 2 mL.      predniSONE  (DELTASONE ) 20 MG tablet Take 1 tablet (20 mg total) by mouth daily for 5 days, THEN 0.5 tablets (10 mg total) daily for 5 days. 8 tablet 0    sodium chloride  (BD POSIFLUSH NORMAL SALINE 0.9) 0.9 % injection Inject 2mL of 0.9%NaCl into colistin vial & gently mix. After withdrawing colistin dose, add an additional 1mL of 0.9%NaCl to neb cup with the colistin dose. 180 mL 11    sodium chloride  10 %  Nebu Measure and Inhale 5 mL by nebulization Two (2) times a day. Discard remaining amount and use a new vial for each dose. 900 mL 3    sterile water  Soln Use 2mL to mix Colistin, then add additional 2mL to neb cup with 2mL of mixed colistin. Inhale twice daily 28 days on and 28 days off. 600 mL 5    syringe with needle (BD LUER-LOK SYRINGE) 3 mL 20 gauge x 1 1/2 Syrg For use with inhaled antibiotic (Colistin) 60 each 30    syringe with needle (BD LUER-LOK SYRINGE) 3 mL 21 gauge x 1 Syrg For use with inhaled antibiotic (Colistin) 60 each 5    tobramycin , PF, ([name] ) 300 mg/5 mL nebulizer solution Inhale the contents of 1 ampule (300 mg total) by nebulization every twelve (12) hours. 28 days on and 28 days off. 280 mL 5    zinc  gluconate 50 mg (7 mg elemental zinc ) tablet Take 1 tablet (50 mg total) by mouth.      zoledronic  acid-mannitol &water  (RECLAST ) 5 mg/100 mL PgBk Infusion       No current facility-administered medications for this visit.       Allergies   Allergen Reactions    Gentamicin Other (See Comments)     BALANCE ISSUES    Tobramycin  Other (See Comments)     ototoxicity    Mirtazapine  Other (See Comments)     Lower extremity swelling    Morphine Nausea And Vomiting       Patient Active Problem List   Diagnosis    Bronchiectasis      Esophagitis    Pseudomonas aeruginosa infection    Daytime somnolence    Memory loss    Hypersomnia with sleep apnea    Essential hypertension    Osteoarthrosis    Dizziness    Excessive daytime sleepiness    Sleep disturbances    Loculated pleural effusion    Moderate Pharyngeal dysphagia    IDA (iron deficiency anemia)    Disequilibrium    Gastroesophageal reflux disease without esophagitis    Hoarseness    Dyspnea on exertion    Acute on chronic respiratory failure with hypoxemia      Hemoptysis    Pulmonary aspergilloma      Sensory neuropathy    Lumbosacral radiculopathy    Other osteoporosis without current pathological fracture       Medication list has been reviewed and updated in Epic: Yes    Allergies have been reviewed and updated in Epic: Yes    Appropriateness of Therapy     Acute infections noted within Epic:  MDR Pseudomonas  Patient reported infection: None    Is the medication and dose appropriate based on diagnosis, medication list, comorbidities, allergies, medical history, patient???s ability to self-administer the medication, and therapeutic goals? Yes    Prescription has been clinically reviewed: Yes      Baseline Quality of Life Assessment      How many days over the past month did your severe persistent asthma  keep you from your normal activities? For example, brushing your teeth or getting up in the morning. Jordan [name] states he has periods of SOB upon exertion, cough was worsen before hospitalization    Financial Information     Medication Assistance provided: Prior Authorization    Anticipated copay of $0 reviewed with patient. Verified delivery address.    Delivery Information     Scheduled delivery date: 08/18/23    Expected start date:  TBD - First dose will be administered in clinic    Medication will be delivered via [facility]  clinic to the temporary address in [place].  This shipment will not require a signature.      Explained the services we provide at [facility] and that each month we would call to set up refills.  Stressed importance of returning phone calls so that we could ensure they receive their medications in time each month.  Informed patient that we should be setting up refills 7-10 days prior to when they will run out of medication.  A pharmacist will reach out to perform a clinical assessment periodically.  Informed patient that a welcome packet, containing information about our pharmacy and other support services, a Notice of Privacy Practices, and a drug information handout will be sent.      The patient or caregiver noted above participated in the development of this care plan and knows that they can request review of or adjustments to the care plan at any time.      Patient or caregiver verbalized understanding of the above information as well as how to contact the pharmacy at [phone] option 4 with any questions/concerns.  The pharmacy is open Monday through Friday 8:30am-4:30pm.  A pharmacist is available 24/7 via pager to answer any clinical questions they may have.    Patient Specific Needs     Does the patient have any physical, cognitive, or cultural barriers? No    Does the patient have adequate living arrangements? (i.e. the ability to store and take their medication appropriately) Yes    Did you identify any home environmental safety or security hazards? No    Patient prefers to have medications discussed with  Patient     Is the patient or caregiver able to read and understand education materials at a high school level or above? Yes    Patient's primary language is  English     Is the patient high risk? No    Does the patient have an additional or emergency contact listed in their chart? Yes    SOCIAL DETERMINANTS OF HEALTH     At the [facility], we have learned that life circumstances - like trouble affording food, housing, utilities, or transportation can affect the health of many of our patients.   That is why we wanted to ask: are you currently experiencing any life circumstances that are negatively impacting your health and/or quality of life? Patient declined to answer    Social Drivers of Health     Food Insecurity: No Food Insecurity (07/25/2023)    Hunger Vital Sign     Worried About Running Out of Food in the Last Year: Never true     Ran Out of Food in the Last Year: Never true   Tobacco Use: Low Risk  (07/27/2023)    Patient History     Smoking Tobacco Use: Never     Smokeless Tobacco Use: Never     Passive Exposure: Not on file   Transportation Needs: No Transportation Needs (07/25/2023)    PRAPARE - Transportation     Lack of Transportation (Medical): No     Lack of Transportation (Non-Medical): No   Alcohol  Use: Not At Risk (01/22/2021)    Received from [facility], [facility]    AUDIT-C     Frequency of Alcohol  Consumption: Never     Average Number of Drinks: Patient does not  drink     Frequency of Binge Drinking: Never   Housing: Low Risk  (07/25/2023)    Housing     Within the past 12 months, have you ever stayed: outside, in a car, in a tent, in an overnight shelter, or temporarily in someone else's home (i.e. couch-surfing)?: No     Are you worried about losing your housing?: No   Physical Activity: Inactive (01/22/2021)    Received from [facility], [facility]    Exercise Vital Sign     Days of Exercise per Week: 0 days     Minutes of Exercise per Session: 0 min   Utilities: Low Risk  (11/09/2022)    Utilities     Within the past 12 months, have you been unable to get utilities (heat, electricity) when it was really needed?: No   Stress: No Stress Concern Present (01/22/2021)    Received from [facility], [facility] - Occupational Stress Questionnaire     Feeling of Stress : Not at all   Interpersonal Safety: Not At Risk (07/19/2023)    Interpersonal Safety     Unsafe Where You Currently Live: No     Physically Hurt by Anyone: No     Abused by Anyone: No   Substance Use: Not on file (01/25/2023)   Intimate Partner Violence: Unknown (07/19/2023)    Humiliation, Afraid, Rape, and Kick questionnaire     Fear of Current or Ex-Partner: No     Emotionally Abused: No     Physically Abused: No     Sexually Abused: Not on file   Social Connections: Unknown (08/01/2021)    Received from [facility]    Social Network     Social Network: Not on file   Financial Resource Strain: Low Risk  (07/25/2023)    Overall Financial Resource Strain (CARDIA)     Difficulty of Paying Living Expenses: Not hard at all   Health Literacy: Not on file   Internet Connectivity: Not on file       Would you be willing to receive help with any of the needs that you have identified today? Not applicable       Joseph Nickel, PharmD  [facility] Specialty Pharmacist Financial Resource Strain: Low Risk  (07/25/2023)    Overall Financial Resource Strain (CARDIA)     Difficulty of Paying Living Expenses: Not hard at all   Health Literacy: Not on file   Internet Connectivity: Not on file       Would you be willing to receive help with any of the needs that you have identified today? {Yes/No/Not applicable:93005}       Joseph Nickel, PharmD  [facility] Specialty Pharmacist

## 2023-08-10 DIAGNOSIS — I10 Essential (primary) hypertension: Secondary | ICD-10-CM | POA: Diagnosis not present

## 2023-08-10 DIAGNOSIS — D509 Iron deficiency anemia, unspecified: Secondary | ICD-10-CM | POA: Diagnosis not present

## 2023-08-10 DIAGNOSIS — M109 Gout, unspecified: Secondary | ICD-10-CM | POA: Diagnosis not present

## 2023-08-10 DIAGNOSIS — J471 Bronchiectasis with (acute) exacerbation: Secondary | ICD-10-CM | POA: Diagnosis not present

## 2023-08-10 DIAGNOSIS — Z125 Encounter for screening for malignant neoplasm of prostate: Secondary | ICD-10-CM | POA: Diagnosis not present

## 2023-08-10 DIAGNOSIS — E785 Hyperlipidemia, unspecified: Secondary | ICD-10-CM | POA: Diagnosis not present

## 2023-08-10 DIAGNOSIS — E559 Vitamin D deficiency, unspecified: Secondary | ICD-10-CM | POA: Diagnosis not present

## 2023-08-11 NOTE — Unmapped (Signed)
 I reviewed this patient case and all documentation provided by the learner and was readily available for consultation during their interaction with the patient.  I agree with the assessment and plan listed below.    Arnold Long, PharmD   [facility] Specialty Pharmacist

## 2023-08-12 NOTE — Unmapped (Signed)
 [facility] Refill Coordination Note    Specialty Medication(s) to be Shipped:   CF/Pulmonary/Asthma: Pulmozyme  2.5mg /2.63ml  Tobramycin  300mg /72mL, Pari nebulizer cup    Other medication(s) to be shipped: neb cup     Jordan [name], DOB: Mar 30, 1955  Phone: [phone] (home)       All above HIPAA information was verified with patient.     Was a Nurse, learning disability used for this call? No    Completed refill call assessment today to schedule patient's medication shipment from the [facility]  [phone]).  All relevant notes have been reviewed.     Specialty medication(s) and dose(s) confirmed: Regimen is correct and unchanged.   Changes to medications: Hebert reports starting the following medications: Will start Dupixent  soon  Changes to insurance: No  New side effects reported not previously addressed with a pharmacist or physician: None reported  Questions for the pharmacist: No    Confirmed patient received a Conservation officer, historic buildings and a Surveyor, mining with first shipment. The patient will receive a drug information handout for each medication shipped and additional FDA Medication Guides as required.       DISEASE/MEDICATION-SPECIFIC INFORMATION        N/A    SPECIALTY MEDICATION ADHERENCE     Medication Adherence    Patient reported X missed doses in the last month: 0  Specialty Medication: Tobramycin  300 mg/mL BID, cycles 28 days on/off  Patient is on additional specialty medications: Yes  Additional Specialty Medications: Pulmozyme  1 mg/mL daily  Patient Reported Additional Medication X Missed Doses in the Last Month: 0  Patient is on more than two specialty medications: No  Any gaps in refill history greater than 2 weeks in the last 3 months: no  Demonstrates understanding of importance of adherence: yes  Informant: patient  Support network for adherence: family member          Were doses missed due to medication being on hold? No    Pulmozyme  1 mg/ml: 7 days of medicine on hand   Tobramycin  300 mg/41mL: 0 days of medicine on hand (next cycle starts June 2025)    REFERRAL TO PHARMACIST     Referral to the pharmacist: Not needed      [facility]     Shipping address confirmed in Epic.     Cost and Payment: Patient has a $0 copay, payment information is not required.    Delivery Scheduled: Yes, Expected medication delivery date: 08/19/23.     Medication will be delivered via UPS to the prescription address in Epic W[facility].    Joseph Nickel, PharmD   [facility]  Specialty Pharmacist

## 2023-08-13 MED ORDER — FUROSEMIDE 20 MG TABLET
ORAL_TABLET | Freq: Every day | ORAL | 0 refills | 10.00000 days | Status: CP
Start: 2023-08-13 — End: 2023-08-23

## 2023-08-13 MED ORDER — HYDROCODONE 10 MG-CHLORPHENIRAMINE 8 MG/5 ML ORAL SUSP EXTEND.REL 12HR
Freq: Two times a day (BID) | ORAL | 0 refills | 12.00000 days | Status: CP | PRN
Start: 2023-08-13 — End: ?

## 2023-08-17 DIAGNOSIS — Z0000 Encounter for general adult medical examination without abnormal findings: Secondary | ICD-10-CM | POA: Diagnosis not present

## 2023-08-17 DIAGNOSIS — I10 Essential (primary) hypertension: Secondary | ICD-10-CM | POA: Diagnosis not present

## 2023-08-17 DIAGNOSIS — Z6826 Body mass index (BMI) 26.0-26.9, adult: Secondary | ICD-10-CM | POA: Diagnosis not present

## 2023-08-17 DIAGNOSIS — D126 Benign neoplasm of colon, unspecified: Secondary | ICD-10-CM | POA: Diagnosis not present

## 2023-08-17 DIAGNOSIS — M810 Age-related osteoporosis without current pathological fracture: Secondary | ICD-10-CM | POA: Diagnosis not present

## 2023-08-17 DIAGNOSIS — C61 Malignant neoplasm of prostate: Secondary | ICD-10-CM | POA: Diagnosis not present

## 2023-08-17 DIAGNOSIS — J479 Bronchiectasis, uncomplicated: Secondary | ICD-10-CM | POA: Diagnosis not present

## 2023-08-17 DIAGNOSIS — J449 Chronic obstructive pulmonary disease, unspecified: Principal | ICD-10-CM

## 2023-08-17 MED ORDER — OHTUVAYRE 3 MG/2.5 ML SUSPENSION FOR NEBULIZATION
Freq: Two times a day (BID) | RESPIRATORY_TRACT | 11 refills | 0.00000 days | Status: CP
Start: 2023-08-17 — End: ?

## 2023-08-17 NOTE — Unmapped (Signed)
 [facility] Pharmacist Note     Notified that first fax attempt of [employer]  start form on 5/5 was not received, despite receiving fax confirmation. Second fax attempt of [employer]  start form, insurance card, and last chart note from Dr. Sharion Davidson sent today. Received fax confirmation that it was received.     Electronically signed:  Evalene Hilda, PharmD, BCACP, CPP  Clinical Pharmacist Practitioner  [facility]  [phone]    CC:   Jordan [name] (PharmD, CPP)

## 2023-08-18 MED ORDER — BD LUER-LOK SYRINGE 3 ML 21 GAUGE X 1"
5 refills | 0.00000 days | Status: CP
Start: 2023-08-18 — End: ?
  Filled 2023-09-15: qty 60, 30d supply, fill #0

## 2023-08-18 MED FILL — LC PLUS MISC: 30 days supply | Qty: 2 | Fill #2

## 2023-08-18 MED FILL — TOBRAMYCIN 300 MG/5 ML IN 0.225 % SODIUM CHLORIDE FOR NEBULIZATION: RESPIRATORY_TRACT | 56 days supply | Qty: 280 | Fill #4

## 2023-08-18 MED FILL — PULMOZYME 1 MG/ML SOLUTION FOR INHALATION: RESPIRATORY_TRACT | 30 days supply | Qty: 75 | Fill #3

## 2023-08-18 NOTE — Unmapped (Signed)
 Jordan [name] has been contacted in regards to their refill of Dupixent . At this time, they have declined refill due to holding dupixent  until after a minor procedure on June 12th. He is aware the loading dose is at the clinic and he will follow-up with them to set up injection training. Refill assessment call date has been updated per the patient's request.    Amado Juneau  [facility] Pharmacist

## 2023-08-20 DIAGNOSIS — J479 Bronchiectasis, uncomplicated: Secondary | ICD-10-CM | POA: Diagnosis not present

## 2023-08-20 DIAGNOSIS — J449 Chronic obstructive pulmonary disease, unspecified: Secondary | ICD-10-CM | POA: Diagnosis not present

## 2023-08-20 DIAGNOSIS — J9611 Chronic respiratory failure with hypoxia: Secondary | ICD-10-CM | POA: Diagnosis not present

## 2023-08-26 DIAGNOSIS — J479 Bronchiectasis, uncomplicated: Principal | ICD-10-CM

## 2023-08-30 NOTE — Unmapped (Signed)
 [facility] and [facility] Delivery Pharmacy Refill Coordination Note    Specialty Medication(s) to be Shipped:   CF/Pulmonary/Asthma: PULMOZYME  1 mg/mL nebulizer solution (dornase alfa )  colistimethate  150 mg injection (COLYMYCIN)    Other medication(s) to be shipped: sodium chloride  10 % Nebu     Jordan [name], DOB: July 16, 1955  Phone: [phone] (home)       All above HIPAA information was verified with patient.     Was a Nurse, learning disability used for this call? No    Completed refill call assessment today to schedule patient's medication shipment from the [facility]  [phone]).  All relevant notes have been reviewed.     Specialty medication(s) and dose(s) confirmed: Regimen is correct and unchanged.   Changes to medications: Bryon reports no changes at this time.  Changes to insurance: No  New side effects reported not previously addressed with a pharmacist or physician: None reported  Questions for the pharmacist: No    Confirmed patient received a Conservation officer, historic buildings and a Surveyor, mining with first shipment. The patient will receive a drug information handout for each medication shipped and additional FDA Medication Guides as required.       DISEASE/MEDICATION-SPECIFIC INFORMATION        N/A    SPECIALTY MEDICATION ADHERENCE     Medication Adherence    Patient reported X missed doses in the last month: 0  Specialty Medication: PULMOZYME  1 mg/mL nebulizer solution (dornase alfa )  Patient is on additional specialty medications: Yes  Additional Specialty Medications: colistimethate  150 mg injection (COLYMYCIN)  Patient Reported Additional Medication X Missed Doses in the Last Month: 0  Patient is on more than two specialty medications: Yes  Specialty Medication: DUPIXENT  SYRINGE 200 mg/1.14 mL syringe (dupilumab )  Patient Reported Additional Medication X Missed Doses in the Last Month: 0  Support network for adherence: family member              Were doses missed due to medication being on hold? No    PULMOZYME  1 mg/mL nebulizer solution (dornase alfa ): 14 days of medicine on hand   colistimethate  150 mg injection (COLYMYCIN) : 5 doses of medicine on hand (starts July)      REFERRAL TO PHARMACIST     Referral to the pharmacist: Not needed      [facility]     Shipping address confirmed in Epic.     Cost and Payment: Patient has a $0 copay, payment information is not required.    Delivery Scheduled: Yes, Expected medication delivery date: 06/26.     Medication will be delivered via UPS to the prescription address in Epic WAM.    Deshana Rominger   [facility]  Specialty Technician

## 2023-09-02 DIAGNOSIS — L570 Actinic keratosis: Secondary | ICD-10-CM | POA: Diagnosis not present

## 2023-09-02 DIAGNOSIS — Z85828 Personal history of other malignant neoplasm of skin: Secondary | ICD-10-CM | POA: Diagnosis not present

## 2023-09-02 DIAGNOSIS — L720 Epidermal cyst: Secondary | ICD-10-CM | POA: Diagnosis not present

## 2023-09-03 MED ORDER — CIPROFLOXACIN 750 MG TABLET
ORAL_TABLET | Freq: Two times a day (BID) | ORAL | 0 refills | 14.00000 days | Status: CP
Start: 2023-09-03 — End: ?

## 2023-09-03 NOTE — Unmapped (Signed)
 Adult Pulmonary Specialty Clinic Pharmacist Note     Purpose:     September 03, 2023 3:00 PM: Phone call to Patient  Spoke with Jordan [name] about his new prescribed medication, Ohtuvayre  (esifentrine).  Patient has not yet received the medication, but is expecting it to arrive in the next 1-2 weeks  Informed Jordan [name] that this is a nebulized medication to be administered twice daily, one ampule in the morning and one ampule in the evening with his nebulizer equipment  He asked about storing this medication after hearing it does not require refrigeration, which I confirmed he can leave it at room temperature  I advised the patient to leave the ampule in the foil until he is ready to use, like his other nebulized medications, and must be shaken up after removal from the foil to resuspend  Within his current nebulized medication regimen, the Ohtuvayre  should be used after his Pulmozyme  and before his inhaled antibiotics (albuterol >sodium chloride >pulmozyme >Ohtuvayre >antibiotics>Trelegy) and patient was agreeable to adding this step  Informed that this medication is not for rescue and that he should always use his rescue inhaler when symptoms arise  Also informed him of medication's side effect profile, which is very minimal since it is well tolerated. 1-2% of people experienced back pain, diarrhea, rise in blood pressure. Advised Jordan [name] to contact pulmonology if he notices any of these or changes he feels could be due to the medication.  Jordan [name] expressed his understanding of this plan and appreciation for calling.      Total time spent: 10 minutes     Electronically signed:  Brenda Calkin, PharmD Candidate    [facility]  [phone]

## 2023-09-10 ENCOUNTER — Ambulatory Visit
Admit: 2023-09-10 | Discharge: 2023-09-11 | Payer: Medicare (Managed Care) | Attending: Internal Medicine | Primary: Internal Medicine

## 2023-09-10 NOTE — Unmapped (Signed)
 Pt arrived to clinic for teaching of Dupixent . Pt was given education on the purpose of medication and was informed of potential side effects. Pt was given instruction of how to prep, inject, and dispose of the medication. Pt was able to verify understanding using the teach back method.     Pt was able to safely inject medication into their left and right upper abdomen. No complications noted during injection. Pt stayed one hour after injection for monitoring. No signs of moderate/severe reaction noted during stay.      All questions and concerns appropriately answered during visit. Pt instructed to call the clinic nurse line should they develop any questions or concerns regarding medication.     Nursing Assessment completed.   Drug allergies assessed.     Med supplied: [facility]Patient Supplied     Drug Administered: Dupixent  600mg  (loading dose)   Route: SubQ  Frequency: 14 days     Right upper abdomen: 300 mg  Lot #: [id number] / Exp 10/2025    Left upper abdomen: 300 mg  Lot #: [id number] / Exp 10/2025    Assessment:  Tolerated procedure well, no side effects. Patient has epi pen.   Patient aware to contact office if side effects occur.

## 2023-09-10 NOTE — Unmapped (Signed)
 [facility] and Home Delivery Pharm[facility] Clinical Assessment & Refill Coordination Note    Jordan [name], DOB: Jan 28, 1956  Phone: [phone] (home)     All above HIPAA information was verified with patient.     Was a Nurse, learning disability used for this call? No    Specialty Medication(s):   CF/Pulmonary/Asthma: Dupixent      Current Medications[1]     Changes to medications: Gussie reports no changes at this time.    Medication list has been reviewed and updated in Epic: Yes    Allergies[2]    Changes to allergies: No    Allergies have been reviewed and updated in Epic: Yes    SPECIALTY MEDICATION ADHERENCE     Dupixent  300 mg/56mL: 0 doses of medicine on hand    Medication Adherence    Patient reported X missed doses in the last month: 0  Specialty Medication: Dupixent  200 mg/1.14mL Q14d - inject 600mg   Patient is on additional specialty medications: No  Patient is on more than two specialty medications: No  Support network for adherence: family member          Specialty medication(s) dose(s) confirmed: Patient reports changes to the regimen as follows: Will transition to Dupixent  maintenace dose of 300mg  every 14 days on 7/4     Are there any concerns with adherence? No    Adherence counseling provided? Not needed    CLINICAL MANAGEMENT AND INTERVENTION      Clinical Benefit Assessment:    Do you feel the medicine is effective or helping your condition? No    Clinical Benefit counseling provided? Reasonable expectations discussed: Took first dose today in clinic. We discussed it may take up to 4 months to determine clinical benefit     Adverse Effects Assessment:    Are you experiencing any side effects? No - he will stay in clinic for 30 mins to monitor for side effects    Are you experiencing difficulty administering your medicine? No    Quality of Life Assessment:    Quality of Life    Rheumatology  Oncology  Dermatology  Cystic Fibrosis          How many days over the past month did your asthma  keep you from your normal activities? For example, brushing your teeth or getting up in the morning. Patient declined to answer    Have you discussed this with your provider? Not needed    Acute Infection Status:    Acute infections noted within Epic:  MDR Pseudomonas    Patient reported infection: None    Therapy Appropriateness:    Is therapy appropriate based on current medication list, adverse reactions, adherence, clinical benefit and progress toward achieving therapeutic goals? Yes, therapy is appropriate and should be continued     Clinical Intervention:    Was an intervention completed as part of this clinical assessment? No    DISEASE/MEDICATION-SPECIFIC INFORMATION      For patients on injectable medications: Patient currently has 0 doses left.  Next injection is scheduled for 7/4.    Asthma/COPD: Have you had an asthma exacerbation in the last 30 days? Yes, Was hospitalized in May for a bronchiectasis/asthma exacerbation  Have you needed to use your rescue inhaler more often than usual in the last 30 days? Yes, during hospitalization. He states breathing has improved since he was discharged.  Have you needed to take steroids for your asthma in the last 30 days? Yes, completed prednisone  taper at the end of  May    PATIENT SPECIFIC NEEDS     Does the patient have any physical, cognitive, or cultural barriers? No    Is the patient high risk? No    Does the patient require physician intervention or other additional services (i.e., nutrition, smoking cessation, social work)? No    Does the patient have an additional or emergency contact listed in their chart? Yes    SOCIAL DETERMINANTS OF HEALTH     At the [facility], we have learned that life circumstances - like trouble affording food, housing, utilities, or transportation can affect the health of many of our patients.   That is why we wanted to ask: are you currently experiencing any life circumstances that are negatively impacting your health and/or quality of life? Patient declined to answer    Social Drivers of Health     Food Insecurity: No Food Insecurity (07/25/2023)    Hunger Vital Sign     Worried About Running Out of Food in the Last Year: Never true     Ran Out of Food in the Last Year: Never true   Tobacco Use: Low Risk  (07/27/2023)    Patient History     Smoking Tobacco Use: Never     Smokeless Tobacco Use: Never     Passive Exposure: Not on file   Transportation Needs: No Transportation Needs (07/25/2023)    PRAPARE - Transportation     Lack of Transportation (Medical): No     Lack of Transportation (Non-Medical): No   Alcohol  Use: Not At Risk (01/22/2021)    Received from [facility]    AUDIT-C     Q1: How often do you have a drink containing alcohol ?: Never     Q2: How many drinks containing alcohol  do you have on a typical day when you are drinking?: Patient does not drink     Q3: How often do you have six or more drinks on one occasion?: Never   Housing: Low Risk  (07/25/2023)    Housing     Within the past 12 months, have you ever stayed: outside, in a car, in a tent, in an overnight shelter, or temporarily in someone else's home (i.e. couch-surfing)?: No     Are you worried about losing your housing?: No   Physical Activity: Inactive (01/22/2021)    Received from [facility]    Exercise Vital Sign     On average, how many days per week do you engage in moderate to strenuous exercise (like a brisk walk)?: 0 days     On average, how many minutes do you engage in exercise at this level?: 0 min   Utilities: Low Risk  (11/09/2022)    Utilities     Within the past 12 months, have you been unable to get utilities (heat, electricity) when it was really needed?: No   Stress: No Stress Concern Present (01/22/2021)    Received from [facility] - Occupational Stress Questionnaire     Feeling of Stress : Not at all   Interpersonal Safety: Not At Risk (07/19/2023)    Interpersonal Safety     Unsafe Where You Currently Live: No     Physically Hurt by Anyone: No     Abused by Anyone: No   Substance Use: Not on file (01/25/2023)   Intimate Partner Violence: Unknown (07/19/2023)    Humiliation, Afraid, Rape, and Kick questionnaire     Fear of  Current or Ex-Partner: No     Emotionally Abused: No     Physically Abused: No     Sexually Abused: Not on file   Social Connections: Unknown (08/01/2021)    Received from [facility]    Social Network     Social Network: Not on file   Financial Resource Strain: Low Risk  (07/25/2023)    Overall Financial Resource Strain (CARDIA)     Difficulty of Paying Living Expenses: Not hard at all   Health Literacy: Not on file   Internet Connectivity: Not on file       Would you be willing to receive help with any of the needs that you have identified today? Not applicable       SHIPPING     Specialty Medication(s) to be Shipped:   CF/Pulmonary/Asthma: Dupixent     Other medication(s) to be shipped: see refill note from 6/9      Changes to insurance: No    Cost and Payment: Patient has a $0 copay, payment information is not required.    Delivery Scheduled: Yes, Expected medication delivery date: 09/16/23.     Medication will be delivered via UPS to the confirmed prescription address in [facility].    The patient will receive a drug information handout for each medication shipped and additional FDA Medication Guides as required.  Verified that patient has previously received a Conservation officer, historic buildings and a Surveyor, mining.    The patient or caregiver noted above participated in the development of this care plan and knows that they can request review of or adjustments to the care plan at any time.      All of the patient's questions and concerns have been addressed.    Shelba Moats, PharmD   [facility] Specialty Pharmacist       [1]   Current Outpatient Medications   Medication Sig Dispense Refill    albuterol  2.5 mg /3 mL (0.083 %) nebulizer solution INHALE 3 ML BY NEBULIZATION THREE (3) TIMES A DAY. 825 mL 3    albuterol  HFA 90 mcg/actuation inhaler Inhale 2 puffs four (4) times a day as needed.      alcohol  swabs  (ALCOHOL  PREP PADS) PadM Use as directed with inhaled antibiotics 100 each 5    ascorbic acid , vitamin C , (VITAMIN C ) 1000 MG tablet Take 1 tablet (1,000 mg total) by mouth daily.      atorvastatin  (LIPITOR ) 10 MG tablet Take 1 tablet (10 mg total) by mouth daily.      azithromycin  (ZITHROMAX ) 250 MG tablet TAKE 1 TABLET BY MOUTH EVERY DAY 90 tablet 3    cholecalciferol , vitamin D3-50 mcg, 2,000 unit,, 50 mcg (2,000 unit) tablet Take 1 tablet (50 mcg total) by mouth daily.      ciprofloxacin  HCl (CIPRO ) 750 MG tablet Take 1 tablet (750 mg total) by mouth two (2) times a day. 28 tablet 0    colistimethate  (COLYMYCIN) 150 mg injection Inject 2 mL sterile water  for injection to mix colistin vial, then draw up 2 mL (150mg ) and inhale 2 times a day, 28 days on and 28 days off. 60 each 5    dilTIAZem  (CARDIZEM  CD) 120 MG 24 hr capsule Take 1 capsule (120 mg total) by mouth daily. 90 capsule 3    dornase alfa  (PULMOZYME ) 1 mg/mL nebulizer solution Inhale 1 ampule (2.5 mg) via nebulizer daily. Use at least 30-60 minutes before airway clearance, or after airway clearance. 75  mL 11    dupilumab  (DUPIXENT  SYRINGE) 200 mg/1.14 mL syringe Inject the contents of 1 syringe (200 mg total) under the skin every fourteen (14) days. 2.3 mL 11    empty container (SHARPS CONTAINER) Misc USE AS DIRECTED 1 each 0    empty container Misc USE AS DIRECTED 1 each 2    empty container Misc Use as directed to dispose of needles. When full, make sure lid is closed tightly then dispose of container in trash. 1 each 2    ensifentrine  (OHTUVAYRE ) 3 mg/2.5 mL NbSp Inhale 3 mg two (2) times a day. 75 mL 11    EPINEPHrine  (EPIPEN ) 0.3 mg/0.3 mL injection Inject 0.3 mL (0.3 mg total) into the muscle once as needed for anaphylaxis (may repeat if needed) for up to 2 doses. 2 each 0    ferrous sulfate  325 (65 FE) MG tablet Take 1 tablet (325 mg total) by mouth Two (2) times a day. BID      fluticasone  propionate (FLONASE ) 50 mcg/actuation nasal spray 1 spray into each nostril daily.      fluticasone -umeclidin-vilanter (TRELEGY ELLIPTA ) 100-62.5-25 mcg inhaler Inhale 1 puff daily. 90 each 3    furosemide  (LASIX ) 20 MG tablet Take 1 tablet (20 mg total) by mouth daily for 10 days. 10 tablet 0    HYDROcodone -acetaminophen  (NORCO ) 7.5-325 mg per tablet Take 1 tablet by mouth three (3) times a day (at 6am, noon and 6pm).      HYDROcodone -chlorpheniramine  polistirex (TUSSIONEX PENNKINETIC ) 10-8 mg/5 mL ER suspension Take 5 mL by mouth every twelve (12) hours as needed for cough. 115 mL 0    montelukast  (SINGULAIR ) 10 mg tablet TAKE 1 TABLET BY MOUTH EVERY DAY AT NIGHT 90 tablet 3    multivitamin-minerals-lutein Tab Take 1 tablet by mouth daily.      nebulizers (LC PLUS) Misc Use with inhaled medications 1 each 5    nebulizers (LC PLUS) Misc Use as directed 4 each 3    nebulizers Misc use with nebulized medications 12 each 0    needle, disp, 21 G (BD REGULAR BEVEL NEEDLES) 21 gauge x 1 1/2 Ndle Use as directed with inhaled Colistin 100 each 3    NON FORMULARY Take 650 mg by mouth two (2) times a day. Brand: Citracal 650mg       omeprazole (PRILOSEC) 20 MG capsule Take 1 capsule (20 mg total) by mouth Two (2) times a day.      OXYGEN -AIR DELIVERY SYSTEMS MISC Inhale 2 mL.      sodium chloride  (BD POSIFLUSH NORMAL SALINE 0.9) 0.9 % injection Inject 2mL of 0.9%NaCl into colistin vial & gently mix. After withdrawing colistin dose, add an additional 1mL of 0.9%NaCl to neb cup with the colistin dose. 180 mL 11    sodium chloride  10 % Nebu Measure and Inhale 5 mL by nebulization Two (2) times a day. Discard remaining amount and use a new vial for each dose. 900 mL 3    sterile water  Soln Use 2mL to mix Colistin, then add additional 2mL to neb cup with 2mL of mixed colistin. Inhale twice daily 28 days on and 28 days off. 600 mL 5    syringe with needle (BD LUER-LOK SYRINGE) 3 mL 20 gauge x 1 1/2 Syrg For use with inhaled antibiotic (Colistin) 60 each 30    syringe with needle (BD LUER-LOK SYRINGE) 3 mL 21 gauge x 1 Syrg For use with inhaled antibiotic (Colistin) 60 each 5    tobramycin ,  PF, ([name] ) 300 mg/5 mL nebulizer solution Inhale the contents of 1 ampule (300 mg total) by nebulization every twelve (12) hours. 28 days on and 28 days off. 280 mL 5    zinc  gluconate 50 mg (7 mg elemental zinc ) tablet Take 1 tablet (50 mg total) by mouth.      zoledronic  acid-mannitol &water  (RECLAST ) 5 mg/100 mL PgBk Infusion       No current facility-administered medications for this visit.   [2]   Allergies  Allergen Reactions    Gentamicin Other (See Comments)     BALANCE ISSUES    Tobramycin  Other (See Comments)     ototoxicity    Mirtazapine  Other (See Comments)     Lower extremity swelling    Morphine Nausea And Vomiting

## 2023-09-15 MED FILL — DUPIXENT 200 MG/1.14 ML SUBCUTANEOUS SYRINGE: SUBCUTANEOUS | 28 days supply | Qty: 2.28 | Fill #0

## 2023-09-15 MED FILL — BD REGULAR BEVEL NEEDLES 21 GAUGE X 1 1/2": 30 days supply | Qty: 60 | Fill #3

## 2023-09-15 MED FILL — LC PLUS MISC: 30 days supply | Qty: 2 | Fill #3

## 2023-09-15 MED FILL — COLISTIN (COLISTIMETHATE SODIUM) 150 MG SOLUTION FOR INJECTION: INTRAMUSCULAR | 60 days supply | Qty: 60 | Fill #1

## 2023-09-15 MED FILL — BD POSIFLUSH NORMAL SALINE 0.9 % INJECTION SYRINGE: ORAL | 30 days supply | Qty: 180 | Fill #1

## 2023-09-15 MED FILL — PULMOZYME 1 MG/ML SOLUTION FOR INHALATION: RESPIRATORY_TRACT | 30 days supply | Qty: 75 | Fill #4

## 2023-09-15 MED FILL — SODIUM CHLORIDE 10 % FOR NEBULIZATION: RESPIRATORY_TRACT | 30 days supply | Qty: 900 | Fill #1

## 2023-09-20 DIAGNOSIS — J9611 Chronic respiratory failure with hypoxia: Secondary | ICD-10-CM | POA: Diagnosis not present

## 2023-09-20 DIAGNOSIS — J479 Bronchiectasis, uncomplicated: Secondary | ICD-10-CM | POA: Diagnosis not present

## 2023-09-20 DIAGNOSIS — J449 Chronic obstructive pulmonary disease, unspecified: Secondary | ICD-10-CM | POA: Diagnosis not present

## 2023-09-21 DIAGNOSIS — G894 Chronic pain syndrome: Secondary | ICD-10-CM | POA: Diagnosis not present

## 2023-09-21 DIAGNOSIS — R600 Localized edema: Secondary | ICD-10-CM | POA: Diagnosis not present

## 2023-09-21 DIAGNOSIS — Z6827 Body mass index (BMI) 27.0-27.9, adult: Secondary | ICD-10-CM | POA: Diagnosis not present

## 2023-09-28 DIAGNOSIS — R600 Localized edema: Secondary | ICD-10-CM | POA: Diagnosis not present

## 2023-10-04 DIAGNOSIS — H16121 Filamentary keratitis, right eye: Secondary | ICD-10-CM | POA: Diagnosis not present

## 2023-10-07 NOTE — Unmapped (Signed)
 [facility] and Home Delivery Pharmacy Refill Coordination Note    Specialty Medication(s) to be Shipped:   CF/Pulmonary/Asthma: Dupixent   Pulmozyme  2.5mg /2.54ml  Tobramycin  300mg /15mL, Pari nebulizer cup    Other medication(s) to be shipped: neb cup     Jordan [name], DOB: [birth year]  Phone: [phone] (home)       All above HIPAA information was verified with patient.     Was a Nurse, learning disability used for this call? No    Completed refill call assessment today to schedule patient's medication shipment from the [facility]  [phone]).  All relevant notes have been reviewed.     Specialty medication(s) and dose(s) confirmed: Regimen is correct and unchanged.   Changes to medications: Jordan [name] reports starting the following medications: furosemide  (lasix )  Changes to insurance: No  New side effects reported not previously addressed with a pharmacist or physician: None reported  Questions for the pharmacist: No    Confirmed patient received a Conservation officer, historic buildings and a Surveyor, mining with first shipment. The patient will receive a drug information handout for each medication shipped and additional FDA Medication Guides as required.       DISEASE/MEDICATION-SPECIFIC INFORMATION        For patients on injectable medications: Patient currently has 1 doses left.  Next injection is scheduled for 7/18.    SPECIALTY MEDICATION ADHERENCE     Medication Adherence    Patient reported X missed doses in the last month: 0  Specialty Medication: Dupixent  200 mg/1.20mL  Patient is on additional specialty medications: Yes  Additional Specialty Medications: Pulmozyme  1 mg/mL daily  Patient Reported Additional Medication X Missed Doses in the Last Month: 0  Patient is on more than two specialty medications: Yes  Specialty Medication: Tobramycin  300 mg/5mL BID, cycles 28 days on/off  Patient Reported Additional Medication X Missed Doses in the Last Month: 0  Any gaps in refill history greater than 2 weeks in the last 3 months: no  Demonstrates understanding of importance of adherence: yes  Informant: patient  Support network for adherence: family member          Were doses missed due to medication being on hold? No    Dupixent   200 mg/1.37mL: 1 doses of medicine on hand   Pulmozyme  1 mg/ml: 14 days of medicine on hand   Tobramycin  300 mg/58mL: 0 days of medicine on hand (next cycle starts 8/1)    REFERRAL TO PHARMACIST     Referral to the pharmacist: Not needed      [facility]     Shipping address confirmed in Epic.     Cost and Payment: Patient has a $0 copay, payment information is not required.    Delivery Scheduled: Yes, Expected medication delivery date: 10/14/23.     Medication will be delivered via UPS to the prescription address in Epic WAM.    Shelba Moats, PharmD   [facility] Delivery Pharm[facility]  Specialty Pharmacist

## 2023-10-13 MED FILL — DUPIXENT 200 MG/1.14 ML SUBCUTANEOUS SYRINGE: SUBCUTANEOUS | 28 days supply | Qty: 2.28 | Fill #1

## 2023-10-13 MED FILL — PULMOZYME 1 MG/ML SOLUTION FOR INHALATION: RESPIRATORY_TRACT | 30 days supply | Qty: 75 | Fill #5

## 2023-10-13 MED FILL — TOBRAMYCIN 300 MG/5 ML IN 0.225 % SODIUM CHLORIDE FOR NEBULIZATION: RESPIRATORY_TRACT | 56 days supply | Qty: 280 | Fill #5

## 2023-10-13 MED FILL — LC PLUS MISC: 30 days supply | Qty: 2 | Fill #4

## 2023-10-20 DIAGNOSIS — J479 Bronchiectasis, uncomplicated: Secondary | ICD-10-CM | POA: Diagnosis not present

## 2023-10-20 DIAGNOSIS — J9611 Chronic respiratory failure with hypoxia: Secondary | ICD-10-CM | POA: Diagnosis not present

## 2023-10-20 DIAGNOSIS — J449 Chronic obstructive pulmonary disease, unspecified: Secondary | ICD-10-CM | POA: Diagnosis not present

## 2023-10-20 DIAGNOSIS — R600 Localized edema: Secondary | ICD-10-CM | POA: Diagnosis not present

## 2023-10-21 DIAGNOSIS — J3089 Other allergic rhinitis: Principal | ICD-10-CM

## 2023-10-21 MED ORDER — MONTELUKAST 10 MG TABLET
ORAL_TABLET | Freq: Every evening | ORAL | 3 refills | 90.00000 days | Status: CP
Start: 2023-10-21 — End: ?

## 2023-11-09 NOTE — Unmapped (Signed)
 [facility] Refill Coordination Note    Specialty Medication(s) to be Shipped:   CF/Pulmonary/Asthma: Inhaled colistimethate  150 mg injection (COLYMYCIN) and kit: sodium chloride  10 % Nebu, syringes with needles, Pari nebulizer cup, alcohol  swabs  and Inflammatory Disorders: Dupixent     Other medication(s) to be shipped: BD LUER-LOK SYRINGE 3 mL 21 gauge x 1 Syrg (syringe with needle), BD POSIFLUSH NORMAL SALINE 0.9 injection (sodium chloride ), BD REGULAR BEVEL NEEDLES 21 gauge x 1 1/2 Ndle (needle (disp) 21 G), sodium chloride  10 % Nebu    Specialty Medications not needed at this time: N/A     Jordan [name], DOB: [birth year]  Phone: [phone] (home)       All above HIPAA information was verified with patient.     Was a Nurse, learning disability used for this call? No    Completed refill call assessment today to schedule patient's medication shipment from the [facility]  [phone]).  All relevant notes have been reviewed.     Specialty medication(s) and dose(s) confirmed: Regimen is correct and unchanged.   Changes to medications: Tameem reports no changes at this time.  Changes to insurance: No  New side effects reported not previously addressed with a pharmacist or physician: None reported  Questions for the pharmacist: No    Confirmed patient received a Conservation officer, historic buildings and a Surveyor, mining with first shipment. The patient will receive a drug information handout for each medication shipped and additional FDA Medication Guides as required.       DISEASE/MEDICATION-SPECIFIC INFORMATION        For patients on injectable medications: Next injection is scheduled for 11/20/2023 for Dupixent  and 11/22/2023 for Colymycin.    SPECIALTY MEDICATION ADHERENCE     Medication Adherence    Patient reported X missed doses in the last month: 0  Specialty Medication: DUPIXENT  SYRINGE 200 mg/1.14 mL syringe (dupilumab )  Patient is on additional specialty medications: Yes  Additional Specialty Medications: colistimethate  150 mg injection (COLYMYCIN)  Patient Reported Additional Medication X Missed Doses in the Last Month: 0  Patient is on more than two specialty medications: No  Support network for adherence: family member              Were doses missed due to medication being on hold? No     DUPIXENT  SYRINGE 200 mg/1.14 mL syringe (dupilumab ) mg/ml: 0 doses of medicine on hand   colistimethate  150 mg injection (COLYMYCIN)  mg: 0 doses of medicine on hand       REFERRAL TO PHARMACIST     Referral to the pharmacist: Not needed      SHIPPING     Shipping address confirmed in Epic.     Cost and Payment: Patient has a $0 copay, payment information is not required.    Delivery Scheduled: Yes, Expected medication delivery date: 08/272025.     Medication will be delivered via UPS to the prescription address in Epic WAM.     Laymon Duty   [facility]  Specialty Technician

## 2023-11-10 NOTE — Unmapped (Signed)
 [facility] Refill Coordination Note    Specialty Medication(s) to be Shipped:   CF/Pulmonary/Asthma: Pulmozyme  2.5mg /2.32ml    Other medication(s) to be shipped: No additional medications requested for fill at this time    Specialty Medications not needed at this time: N/A     Jordan [name], DOB: [birth year]  Phone: [phone] (home)       All above HIPAA information was verified with patient.     Was a Nurse, learning disability used for this call? No    Completed refill call assessment today to schedule patient's medication shipment from the [facility]  [phone]).  All relevant notes have been reviewed.     Specialty medication(s) and dose(s) confirmed: Regimen is correct and unchanged.   Changes to medications: Archit reports no changes at this time.  Changes to insurance: No  New side effects reported not previously addressed with a pharmacist or physician: None reported  Questions for the pharmacist: No    Confirmed patient received a Conservation officer, historic buildings and a Surveyor, mining with first shipment. The patient will receive a drug information handout for each medication shipped and additional FDA Medication Guides as required.       DISEASE/MEDICATION-SPECIFIC INFORMATION        N/A    SPECIALTY MEDICATION ADHERENCE     Medication Adherence    Patient reported X missed doses in the last month: 0  Specialty Medication: PULMOZYME  1 mg/mL nebulizer solution (dornase alfa )  Patient is on additional specialty medications: No  Support network for adherence: family member              Were doses missed due to medication being on hold? No    PULMOZYME  1 mg/mL nebulizer solution (dornase alfa ) : 7 days of medicine on hand     REFERRAL TO PHARMACIST     Referral to the pharmacist: Not needed      [facility]     Shipping address confirmed in Epic.     Cost and Payment: Patient has a $0 copay, payment information is not required.    Delivery Scheduled: Yes, Expected medication delivery date: 08/27.     Medication will be delivered via UPS to the prescription address in Epic WAM.    Musab Wingard   [place] [facility]  Specialty Technician

## 2023-11-12 DIAGNOSIS — J471 Bronchiectasis with (acute) exacerbation: Principal | ICD-10-CM

## 2023-11-12 MED ORDER — CIPROFLOXACIN 750 MG TABLET
ORAL_TABLET | Freq: Two times a day (BID) | ORAL | 0 refills | 14.00000 days | Status: CP
Start: 2023-11-12 — End: ?

## 2023-11-12 MED ORDER — AMOXICILLIN 875 MG-POTASSIUM CLAVULANATE 125 MG TABLET
ORAL_TABLET | Freq: Two times a day (BID) | ORAL | 0 refills | 14.00000 days | Status: CP
Start: 2023-11-12 — End: 2023-11-26

## 2023-11-12 MED ORDER — PREDNISONE 10 MG TABLET
ORAL_TABLET | ORAL | 0 refills | 0.00000 days | Status: CP
Start: 2023-11-12 — End: ?

## 2023-11-12 NOTE — Unmapped (Signed)
 Sent scripts for Augmentin  (OPF coverage) and Ciproflox (PsA coverage).  Also sent prednisone  taper to local pharmacy.

## 2023-11-12 NOTE — Unmapped (Signed)
 Returned voice mail from pt who reports a persistent deep cough w/a high pitched donkey sound, increased cough & sputum production. Has had to increase o2 to 3.5LPM, and generally maintains at 91-93%. Had an incident where he simply stood up with his o2 on & became dyspneic- sat 85%. Endorses BLE swelling, PCP is managing & pt is on lasix  currently. Swelling ok in the AM, but gets bad throughout the day. Has difficulty elevating legs because it makes him cough more. Encouraged compression stockings.  Is on cycle for [name] , had Dupixent  dose last weekend, and is compliant with other resp meds/treatments.    Discussed with MD via epic chat, and she will send rx for oral antibiotics & pred taper. Pt has an appt late next week w/us  & asked that he comes prepared for potential hospital admission. Reviewed to present to ER if worsens despite meds being ordered. Patient verbalized understanding and thanked me for the call back.

## 2023-11-12 NOTE — Unmapped (Signed)
 Addended by: BLONDIE EMERSON ANETTE ARLEAN on: 11/12/2023 03:33 PM     Modules accepted: Orders

## 2023-11-16 MED FILL — BD POSIFLUSH NORMAL SALINE 0.9 % INJECTION SYRINGE: 30 days supply | Qty: 180 | Fill #2

## 2023-11-16 MED FILL — SODIUM CHLORIDE 10 % FOR NEBULIZATION: RESPIRATORY_TRACT | 30 days supply | Qty: 900 | Fill #2

## 2023-11-16 MED FILL — DUPIXENT 200 MG/1.14 ML SUBCUTANEOUS SYRINGE: SUBCUTANEOUS | 28 days supply | Qty: 2.28 | Fill #2

## 2023-11-16 MED FILL — BD REGULAR BEVEL NEEDLES 21 GAUGE X 1 1/2": 30 days supply | Qty: 60 | Fill #4

## 2023-11-16 MED FILL — COLISTIN (COLISTIMETHATE SODIUM) 150 MG SOLUTION FOR INJECTION: INTRAMUSCULAR | 60 days supply | Qty: 60 | Fill #2

## 2023-11-16 MED FILL — PULMOZYME 1 MG/ML SOLUTION FOR INHALATION: RESPIRATORY_TRACT | 30 days supply | Qty: 75 | Fill #6

## 2023-11-16 MED FILL — BD LUER-LOK SYRINGE 3 ML 21 GAUGE X 1": ORAL | 30 days supply | Qty: 60 | Fill #1

## 2023-11-18 NOTE — Unmapped (Signed)
 [name]    Assessment & Plan:   Patient:Jordan [name] ([birth year])  Reason for visit: Jordan [name] is a 68 y.o.male who returns for follow-up of bronchiectasis secondary to unknown cause (idiopathic) with Pseudomonas colonization, chronic hypoxemic respiratory failure, and asthma/COPD overlap. Currently on antibiotics and steroids for exacerbation with some clinical improvement. Though not back to baseline, no longer feels like he needs to be admitted. However if fails to return to baseline, agreeable to be admitted.    Bronchiectasis Severity Index Score: Calculated on 11/20/23.    Age 5-69 = 2   BMI 18.5 or higher = 0   FEV1 % predicted 30-49% = 2   Hospitalized for severe exacerbation in past 2 years Yes = 5   Exacerbations in previous year 3 or more = 2   mMRC dyspnea score 4 = I am too breathless to leave the house or I am breathless when dressing.   Chronic Pseudomonas colonization (at least 2 positive cultures at least 3 months apart within 1 year) Yes = 3   Colonization with other potential pathogenic bacteria (at least 2 positive cultures at least 3 months apart within 1 year) * No = 0   Radiologic Severity 3 or more lobes or cystic brx = 1       Total Score 19     BSI score of 9+ = Severe bronchiectasis (1 year outcomes 7.6 - 10.5% mortality, 16.7 - 52.6% hospitalization rate; 4 year outcomes 9.9 - 29.2% mortality, 41.2 - 80.4% hospitalization)     * NTM is not included.    Jordan [name] et al. The Bronchiectasis Severity Index: An International Derivation and Validation Study. AJRCCM, 2013; 189(5): [id number].)  Assessment & Plan  Bronchiectasis with chronic Pseudomonas colonization and acute exacerbation  Acute exacerbation with persistent cough and low energy despite current treatment. Prefers to continue oral antibiotics before hospitalization.  - Prescribe additional week of Augmentin  and Cipro .  - Continue prednisone  taper as prescribed.  - Consider hospitalization if no further improvement or clinically worsens at 2 week mark.   - Ordering Brinsupri, new bronchiectasis anti-inflammatory agent.  Would like script to go to [name]. Planning on 25 mg dose.  - At clinic, expectorated sputum for lower respiratory and AFB cultures.  - Continue airway clearance with pulmozyme , hypertonic saline 10%, and alternating months of [name] /colistin.    Chronic hypoxemic respiratory failure with asthma-COPD overlap syndrome  Increased home oxygen  to 4 L/min due to dyspnea. Improvement with Dupixent  expected over months. Current exacerbation may affect respiratory status.  Has noticed some benefit with Ohtuvayre .  - Continue Dupixent  and Ohtuvayre  therapies and monitor effects.  - Continue Trelegy 100  - Maintain home oxygen  at 4 L/min.  - Adjust treatment based on symptoms.    Left lower extremity edema  Persistent edema without tenderness or color changes. Possible causes include medication side effects or blood clot. Cardiologist follow-up planned.  - Order lower extremity ultrasound to rule out blood clot. To be done at [name].  - Follow up with cardiologist as scheduled.    Tachycardia  New onset tachycardia with heart rate up to 140 bpm. Possible causes include respiratory exacerbation and cardiac issues. Further evaluation needed.  - Discuss with cardiologist during follow-up visit.    Jordan [name] will return to clinic in 2-3 months for follow-up with pre-bronchodilator spirometry and sputum cultures.  He will call or send me a message via MyChart if questions or concerns arise before this visit.  Jordan [name] is in agreement with the above plan.      The patient reports they are physically located in [place]  and is currently: not at home. I conducted a audio/video visit. I spent  27m 18s on the video call with the patient. I spent an additional 5 minutes on pre- and post-visit activities on the date of service.    Subjective:   HPI:   History of Present Illness  Jordan [name] is a 68 year old male with idiopathic bronchiectasis and chronic hypoxemic respiratory failure who presents with a recent exacerbation.    Cough and sputum production  - Increased cough frequency during recent exacerbation of idiopathic bronchiectasis with chronic Pseudomonas colonization  - Recent trip to the beach, remained mostly indoors due to worsening respiratory symptoms  - Currently on a three-week prednisone  taper and two weeks of ciprofloxacin  and Augmentin   - Cough improved with prednisone , Augmentin , and ciprofloxacin , but persists as a deep, 'donkey cough' approximately twice daily, lasting 1-2 minutes  - Morning cough described as 'real junky'    Hypoxemia and oxygen  requirement  - Significant drop in oxygen  saturation to 74% with ambulation off supplemental oxygen   - Oxygen  saturation takes 3-4 minutes to return to 91% after reapplying oxygen   - Increased home oxygen  requirement to 4 liters due to dyspnea    Tachycardia and cardiac rhythm  - Heart rate as high as 140 bpm, rarely dropping below 100 bpm  - Usual heart rate previously in the 90s to low 100s  - History of atrial fibrillation  - Unable to take anticoagulation due to history of hemoptysis    Lower extremity edema  - Swelling in left foot and leg up to an inch below the calf  - No tenderness or discoloration  - Currently taking a diuretic    Medication management and recent changes  - Started Ohtuvayre  a few weeks ago, with easier breathing prior to current exacerbation.  - Has nearly two months' supply of medications due to frequent deliveries  - Self-administering Dupixent  injections, alternating sides    Respiratory Symptoms:   Cough: productive of yellow brown sputum that is thicker and sticker than prior.    Nocturnal awakenings: present.  Wheezing: present.  Chest tightness: present.  Rescue albuterol  use: 1x/wk.  Pleurisy: absent.  Hemoptysis: absent.  MMRC: 4 = I am too breathless to leave the house or I am breathless when dressing.    Exacerbations:   Number of exacerbations treated in the past year: 6  Dates of exacerbations: 03/2018, 07/2018, 02/2019, 09/2019, 12/2019, 10/2020, 01/2021, 07/2021, 03/2022, 04/2022, 07/2022, 10/2022, 01/2023, 02/2023, 07/2023, 08/2023, 10/2023   Number of hospitalizations for exacerbations in the past 2 years: 2  Dates of hospitalizations:  01/2015, 03/2015, 01/2016, 03/2017, 09/2019, 10/2020, 02/2021, 10/2022, 01/2023 (rhino/enterovirus), 07/2023     Pulmonary Therapies:   Airway clearance: 2-4 times per day  Nebulized medications: Pulmozyme  daily, HTS 10%   Mechanical: Percussive vest  Chronic Antibiotics:  Inhaled: Alternates [name]  and colistin.  Oral: No  Anti-Inflammatory:  Chronic Macrolide: 250 mg daily  Chronic Prednisone : No  DPP-1 Inhibitor: Eligible  Inhalers/Nebulizers: Trelegy 100, Ohtuvayre   Biologics: Dupixent  started 08/2023  Other Therapies:  Exercise: Not formally.  Pulmonary Rehab: Telehealth pulmonary rehab July 2022. Referred to [facility] at last visit but haven't been able to reach him. Not discussed today.  Supplemental oxygen : Baseline of 2L continuous at rest and with sleep. Increases to 4-5L with activity and uses Life2000  NIPPV: No  Review of Systems  Remainder of a complete review of systems was negative unless mentioned above.    Past Medical History:   Diagnosis Date    Abscess of lung     11/06/2010    CT Chest 10/22/10 12/05/2010 right thoracotomy resection of right middle lobe and resection of right lower lobe abscess     Arthritis     back    Biceps tendon tear 2013    left side    Bronchiectasis         chronic psuedomonas infection    Degenerative joint disease of left acromioclavicular joint     GERD (gastroesophageal reflux disease)     Hypertension     Obstructive sleep apnea on CPAP 03/14/13    AHI 33.5, on BiPAP 12/8 based on sleep study 09/2013    Pneumonia 2012    Lung abcess     PONV (postoperative nausea and vomiting)     Prostate cancer     09/09/2017    Rotator cuff injury left Vertigo        Current Outpatient Medications   Medication Sig Dispense Refill    albuterol  2.5 mg /3 mL (0.083 %) nebulizer solution INHALE 3 ML BY NEBULIZATION THREE (3) TIMES A DAY. 825 mL 3    albuterol  HFA 90 mcg/actuation inhaler Inhale 2 puffs four (4) times a day as needed.      alcohol  swabs  (ALCOHOL  PREP PADS) PadM Use as directed with inhaled antibiotics 100 each 5    amoxicillin -clavulanate (AUGMENTIN ) 875-125 mg per tablet Take 1 tablet by mouth two (2) times a day for 14 days. 28 tablet 0    ascorbic acid , vitamin C , (VITAMIN C ) 1000 MG tablet Take 1 tablet (1,000 mg total) by mouth daily.      atorvastatin  (LIPITOR ) 10 MG tablet Take 1 tablet (10 mg total) by mouth daily.      azithromycin  (ZITHROMAX ) 250 MG tablet TAKE 1 TABLET BY MOUTH EVERY DAY 90 tablet 3    cholecalciferol , vitamin D3-50 mcg, 2,000 unit,, 50 mcg (2,000 unit) tablet Take 1 tablet (50 mcg total) by mouth daily.      ciprofloxacin  HCl (CIPRO ) 750 MG tablet Take 1 tablet (750 mg total) by mouth two (2) times a day. 28 tablet 0    colistimethate  (COLYMYCIN) 150 mg injection Inject 2 mL sterile water  for injection to mix colistin vial, then draw up 2 mL (150mg ) and inhale 2 times a day, 28 days on and 28 days off. 60 each 5    dilTIAZem  (CARDIZEM  CD) 120 MG 24 hr capsule Take 1 capsule (120 mg total) by mouth daily. 90 capsule 3    dornase alfa  (PULMOZYME ) 1 mg/mL nebulizer solution Inhale 1 ampule (2.5 mg) via nebulizer daily. Use at least 30-60 minutes before airway clearance, or after airway clearance. 75 mL 11    dupilumab  (DUPIXENT  SYRINGE) 200 mg/1.14 mL syringe Inject the contents of 1 syringe (200 mg total) under the skin every fourteen (14) days. 2.3 mL 11    empty container (SHARPS CONTAINER) Misc USE AS DIRECTED 1 each 0    empty container Misc USE AS DIRECTED 1 each 2    empty container Misc Use as directed to dispose of needles. When full, make sure lid is closed tightly then dispose of container in trash. 1 each 2 ensifentrine  (OHTUVAYRE ) 3 mg/2.5 mL NbSp Inhale 3 mg two (2) times a day. 75 mL 11    EPINEPHrine  (EPIPEN ) 0.3 mg/0.3 mL  injection Inject 0.3 mL (0.3 mg total) into the muscle once as needed for anaphylaxis (may repeat if needed) for up to 2 doses. 2 each 0    ferrous sulfate  325 (65 FE) MG tablet Take 1 tablet (325 mg total) by mouth Two (2) times a day. BID      fluticasone  propionate (FLONASE ) 50 mcg/actuation nasal spray 1 spray into each nostril daily.      fluticasone -umeclidin-vilanter (TRELEGY ELLIPTA ) 100-62.5-25 mcg inhaler Inhale 1 puff daily. 90 each 3    furosemide  (LASIX ) 20 MG tablet Take 1 tablet (20 mg total) by mouth daily for 10 days. 10 tablet 0    HYDROcodone -acetaminophen  (NORCO ) 7.5-325 mg per tablet Take 1 tablet by mouth three (3) times a day (at 6am, noon and 6pm).      HYDROcodone -chlorpheniramine  polistirex (TUSSIONEX PENNKINETIC ) 10-8 mg/5 mL ER suspension Take 5 mL by mouth every twelve (12) hours as needed for cough. 115 mL 0    montelukast  (SINGULAIR ) 10 mg tablet TAKE 1 TABLET BY MOUTH EVERY DAY AT NIGHT 90 tablet 3    multivitamin-minerals-lutein Tab Take 1 tablet by mouth daily.      nebulizers (LC PLUS) Misc Use with inhaled medications 1 each 5    nebulizers (LC PLUS) Misc Use as directed 4 each 3    nebulizers Misc use with nebulized medications 12 each 0    needle, disp, 21 G (BD REGULAR BEVEL NEEDLES) 21 gauge x 1 1/2 Ndle Use as directed with inhaled Colistin 100 each 3    NON FORMULARY Take 650 mg by mouth two (2) times a day. Brand: Citracal 650mg       omeprazole (PRILOSEC) 20 MG capsule Take 1 capsule (20 mg total) by mouth Two (2) times a day.      OXYGEN -AIR DELIVERY SYSTEMS MISC Inhale 2 mL.      predniSONE  (DELTASONE ) 10 MG tablet Take 40mg  (4 tabs) daily x5 day, then 30mg  (3 tabs) daily x5 day, then 20mg  (2 tabs) daily x 5 day, then 10mg  (1 tab) daily x5 day, then off 50 tablet 0    sodium chloride  (BD POSIFLUSH NORMAL SALINE 0.9) 0.9 % injection Inject 2mL of 0.9%NaCl into colistin vial & gently mix. After withdrawing colistin dose, add an additional 1mL of 0.9%NaCl to neb cup with the colistin dose. 180 mL 11    sodium chloride  10 % Nebu Measure and Inhale 5 mL by nebulization Two (2) times a day. Discard remaining amount and use a new vial for each dose. 900 mL 3    sterile water  Soln Use 2mL to mix Colistin, then add additional 2mL to neb cup with 2mL of mixed colistin. Inhale twice daily 28 days on and 28 days off. 600 mL 5    syringe with needle (BD LUER-LOK SYRINGE) 3 mL 20 gauge x 1 1/2 Syrg For use with inhaled antibiotic (Colistin) 60 each 30    syringe with needle (BD LUER-LOK SYRINGE) 3 mL 21 gauge x 1 Syrg For use with inhaled antibiotic (Colistin) 60 each 5    tobramycin , PF, ([name] ) 300 mg/5 mL nebulizer solution Inhale the contents of 1 ampule (300 mg total) by nebulization every twelve (12) hours. 28 days on and 28 days off. 280 mL 5    zinc  gluconate 50 mg (7 mg elemental zinc ) tablet Take 1 tablet (50 mg total) by mouth.      zoledronic  acid-mannitol &water  (RECLAST ) 5 mg/100 mL PgBk Infusion       No current  facility-administered medications for this visit.       Allergies  Reviewed on 09/10/2023        Reactions Comments    Gentamicin Other (See Comments) BALANCE ISSUES    Tobramycin  Other (See Comments) ototoxicity    Mirtazapine  Other (See Comments) Lower extremity swelling    Morphine Nausea And Vomiting             Social History     Tobacco Use    Smoking status: Never    Smokeless tobacco: Never   Vaping Use    Vaping status: Never Used   Substance Use Topics    Alcohol  use: No     Alcohol /week: 0.0 standard drinks of alcohol     Drug use: No       Objective:   Wt 83 kg (183 lb)  - BMI 28.83 kg/m??   Physical Exam  Fatigued but non-toxic. In no acute distress. Rare cough. No audible wheezing.    Diagnostic Review:     Pulmonary Function Testing:       FVC (% predicted) FEV1 (% predicted) FEV1/FVC   11/10/2013 3.19 L (68%) 2.16 L (61%)  68% 02/23/2014 3.50 L (76%)  2.23 L (64%)  64%   07/12/2014  2.94 L (66%)  2.02 L (58%)  69%   01/15/2015  2.82 L (62%)  1.79 L (52%)  63%   02/08/2015  3.04 L (66%)  2.06 L (59%)  68%   04/02/2015  2.86 L (63%)  1.83 L (53%)  64%   12/10/2015  2.77 L (62%)  1.62 L (48%)  58%   01/27/2016  2.42 L (54%)  1.55 L (46%)  64%   03/24/2016  3.36 L (75%)  1.99 L (59%)  59%   07/21/2016  2.56 L (57%)  1.54 L (46%)  60%   12/29/2016  2.80 L (63%)  1.57 L (47%)  56%   04/06/2017  2.61 L (59%)  1.46 L (44%)  56%   07/06/2017  2.56 L (58%)  1.49 L (45%)  58%   10/12/2017  2.63 L (59%)  1.47 L (44%)  56%   01/18/2018  2.68 L (63%)  1.36 L (42%)  51%   05/17/2018 2.49 L (57%)  1.25 L (38%)  50%   09/26/2018 2.43 L (57%)  1.26 L (39%)  52%   01/26/2019 2.16 L (51%)  1.16 L (36%)  54%   06/06/2019 2.47 L (59%) 1.11 L (34%) 45%   11/10/19 2.14 L (50%) 1.20 L (37%) 56%   02/13/20 2.01 L (48%) 1.08 L (34%) 54%   05/02/20 2.29 L (54%) 1.11 L (34%) 48%   07/02/20 (pre) 2.11 L (51%) 1.17 L (37%) 56%   07/02/20 (post) 2.26 L (54%) [+7.2%] 1.22 L (38%) [+4.0%] 54%   10/31/20 2.46 L (59%) 1.17 L (37%) 48%   07/01/21 2.33 L (56%) 1.13 L (36%) 48%   09/29/21 1.92 L (47%) 1.01 L (32%) 53%   11/21/21 2.10 L (51%) 1.03 L (33%) 49%   04/14/22 2.09 L (54%) 1.05 L (35%) 50%   08/04/22 1.98 L (51%) 1.00 L (34%) 50%   10/23/22 2.07 L (53%) 0.95 L (32%) 46%   11/17/22 2.04 L (53%) 1.04 L (35%) 51%   01/26/23 1.97 L (54%) 0.97 L (35%) 50%   04/20/23 1.97 L (54%) 1.03 L (37%) 52%   07/23/23 1.70 L (47%) 0.93 L (34%) 55%   07/28/23 1.74 L (49%) 0.98 L (36%) 56%  11/19/23 1.85 L (52%) 1.11 L (41%) 60%     Measures are consistent with moderate airway obstruction and restriction. Measures are improved from prior.      Cultures:       Source Bacterial Culture AFB Smear AFB Culture   04/02/15 Sputum 2+ OPF; 1+ Probably mPsA Negative Negative   07/02/15 Sputum 4+ OPF; 3+ mPsA; 3+ sPsA; 1+ Steno Negative Negative   01/23/16 Sputum 2+ OPF; 1+ mPsA; 1+ sPsA - -   03/24/16 Sputum 3+ OPF; 3+ mPsA; 3+ sPsA Negative Overgrown   07/21/16 Sputum 4+ OPF; 4+ mPsA; 2+ sPsA Negative Negative   12/29/16 Sputum 4+ OPF; 3+ mPsA; 3+ sPsA Negative Actinomadura sp   04/06/17 Sputum 3+ OPF; 4+ mPsA Negative Negative   07/06/17 Sputum 4+ OPF; 3+ mPsA Negative Negative   10/12/17 Sputum 4+ OPF; 3+ mPsA Negative Negative   01/18/18 Sputum OPF Negative Negative   05/17/18 Sputum 4+ OPF; 3+ mPsA - -   09/26/18 Sputum 4+ OPF; 3+ mPsA Negative Negative   01/26/19 Sputum 4+ OPF; 2+ mPsA Negative Negative   02/15/19 Sputum 3+ OPF; 3+ mPsA Negative Negative   06/06/19 Sputum 3+ OPF; 3+ sPsA; 3+ mPsA Negative Negative   11/10/19 Sputum 2+ OPF; 3+ mPsA Negative Negative   02/13/20 Sputum OPF Negative Negative   07/02/20 Sputum 3+ OPF; 1+sPsA; 3+ mPsA negative negative   10/31/20 Sputum 4+ OPF; 2+ sPsA; 4+ mPsA negative negative   11/17/20 Sputum 3+ OPF; 3+ mPsA negative negative   02/21/21 Sputum Few PsA negative negative   02/27/21 Sputum Scant C albicans and light A fumigatus - -   03/18/21 Sputum 2+ OPF; 1+ mPsA negative negative   03/26/21 Sputum 4+ OPF; 3+ mPsA negative negative   09/29/21 Sputum 4+ OPF; 4+ sPsA; 4+ mPsA negative negative   04/14/22 Sputum 4+ OPF; 1+ mPsA negative negative   08/04/22 Sputum OPF - -   10/23/22 Sputum 4+ OPF; 2+ mPsA negative negative   11/07/22 Sputum 2+ OPF; 1+ mPsA; 1+ sPsA negative negative   01/26/23 Sputum 1+ OPF; 1+ mPsA negative negative   01/27/23 Sputum 3+ OPF; 2+ mPsA - -   04/17/23 Sputum OPF negative negative   07/23/23 Sputum 3+ OPF; 4+ mPsA; <1+ sPsA negative negative      Radiology:   Chest CT (02/25/21): Images personally reviewed.  Stable sequelae of right middle lobectomy and right lower lobe wedge resection. Stable consolidation in the medial posterior right upper lobe but increased consolidation of the right lung apex with area demonstrating reverse crescent sign suggestive of aspergilloma. Stable bilateral diffuse extensive bronchiectasis, bronchial wall thickening, and sequelae of chronic endobronchial infection.    CTA Chest (04/15/21): Images personally reviewed. Sequelae of right middle lobectomy and right lower lobe wedge resection. Chronic consolidation in the medial posterior right upper lobe and right lung apex with volume loss and architectural distortion of the right upper lobe. Compensatory hyperexpansion of the left lung with mosaic attenuation consistent with areas of air trapping. Extensive chronic bilateral bronchiectasis and bronchial thickening. Unchanged diffuse bilateral tree-in-bud nodularity.  Mildly enlarged and tortuous right bronchial artery, predominantly supplying the right upper lung, arising from the lateral descending aorta at the level of T6 (7:58, 4:38). No enlarged left-sided bronchial arteries. No CT evidence of intrapulmonary arteriovenous malformation.      Chest CT (11/14/21): Images personally reviewed. I agree with radiology interpretation that he continues to have extensive cavitary consolidation in right apex with areas of dependent soft tissue within  medial component of cavity; findings unchanged and c/w mycetoma. Ill-defined nodular consolidation in right posterior lung. S/p resection of right lower lobe. Unchanged extensive bronchiectasis. Mosaic attenuation c/w gas trapping 2/2 small airways disease.      Chest CT (06/15/22): Images personally reviewed.  Minimal change from prior CT though there is a slightly smaller focus of soft tissue/consolidation, possibly reflective of mycetoma, within complex cavitary lesion in the apex of the right lung.  New small focus of endobronchial debris within the cystic airspaces of the lingula.     CXR (08/04/23): Images personally reviewed. Right apical pleural thickening with scarring and volume loss in the right lung apex. Elevated right hemidiaphragm. Extensive bronchiectasis and bronchial wall thickening bilaterally.     Bronchiectasis Evaluation   IgG with subclasses:   Lab Results   Component Value Date/Time    IGGT 996 04/28/2012 06:21 AM    IGG1 402 04/28/2012 06:21 AM    IGG2 399 04/28/2012 06:21 AM    IGG3 148.0 (H) 04/28/2012 06:21 AM    IGG4 47.7 04/28/2012 06:21 AM     IgA (09/21/11): 283 No results found for: IGA  IgM (09/21/11): 52 No results found for: IGM  IgE:   Lab Results   Component Value Date/Time    IGE 16.6 02/13/2020 10:24 AM     Specific Titers: No results found for: TETANUSAB, TETGV, DIPHTERIAAB, DIPGV, S. pneumonia    HIV: No results found for: HIV  Autoimmune: No results found for: ANA, ANATITER1, PAT1, ANATITER2, PAT2, DSDNAAB, ENAS, RF, CCPAB, CCPIGG  ANCA: No results found for: ANCA, IFA, PR3QT, MPO, MPOQT  CBC-D:   Lab Results   Component Value Date/Time    WBC 14.6 (H) 08/02/2023 06:11 AM    WBC 7.9 08/13/2013 03:57 AM    HGB 12.9 08/02/2023 06:11 AM    HGB 14.8 03/24/2015 05:31 PM    HCT 38.3 (L) 08/02/2023 06:11 AM    HCT 30.8 (L) 08/13/2013 03:57 AM    PLT 174 08/02/2023 06:11 AM    PLT 292 08/13/2013 03:57 AM    NEUTROABS 10.8 (H) 08/02/2023 06:11 AM    NEUTROABS 4.9 06/21/2013 09:31 PM    LYMPHSABS 2.3 08/02/2023 06:11 AM    LYMPHSABS 1.9 06/21/2013 09:31 PM    EOSABS 0.1 08/02/2023 06:11 AM    EOSABS 0.2 06/21/2013 09:31 PM    BASOSABS 0.0 08/02/2023 06:11 AM    BASOSABS 0.0 06/21/2013 09:31 PM     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

## 2023-11-18 NOTE — Unmapped (Addendum)
 VISIT SUMMARY:    During your visit, we addressed your recent exacerbation of bronchiectasis, increased oxygen  needs, new onset of tachycardia, and swelling in your left leg. We have adjusted your medications and planned further evaluations to manage your symptoms.    YOUR PLAN:    BRONCHIECTASIS WITH CHRONIC PSEUDOMONAS COLONIZATION AND ACUTE EXACERBATION: You have an acute exacerbation of your bronchiectasis, which has caused a persistent cough and low energy.  -We will prescribe an additional week of Augmentin  and Cipro .  -Continue your prednisone  taper as prescribed.  -If there is no improvement or clinical worsening after the additional antibiotics, planning on hospitalization.  - Sputum sent for cultures.  - Starting process to get you Brinsupri through [facility].    CHRONIC HYPOXEMIC RESPIRATORY FAILURE WITH ASTHMA-COPD OVERLAP SYNDROME: Your home oxygen  needs have increased due to dyspnea, and we expect improvement with treatment of exacerbation and continued use of Dupixent  and Ohtuvayre  over time.  -Continue Dupixent  and Ohtuvayre  therapies and monitor their effects.  - Continue Trelegy  -Maintain your home oxygen  at 4 liters per minute.  -Adjust treatment based on your symptoms.    LEFT LOWER EXTREMITY EDEMA: You have swelling in your left leg without tenderness or color changes, which could be due to medication side effects or a blood clot.  -We will order a lower extremity ultrasound to rule out a blood clot.  -Follow up with your cardiologist as scheduled.    TACHYCARDIA: You have a new onset of tachycardia with a heart rate up to 140 bpm, which may be related to your respiratory condition or cardiac issues.  -Discuss this with your cardiologist during your follow-up visit.        Symptoms of a bronchiectasis exacerbation: 48 hours of at least two of the following:  Increased cough  Change in volume or appearance of sputum  Increased sputum purulence  Worsening shortness of breath and/or exercise tolerance  Fatigue and/or malaise  Coughing up blood (hemoptysis)    If you are experiencing some of these symptoms:  Increase the frequency and/or intensity of your airway clearance  Try to submit a sputum sample for bacterial and AFB cultures (at [facility] or locally)  Reach out to me or your local physician.  If you need antibiotics, recommend treating for 14 days.    Please bring any new airway clearance equipment to your next visit to ensure that you are using and caring for it properly.    Thank you for allowing me to be a part of your care. Please call the clinic with any questions.    Thank you for your visit to the [facility]. You may receive a survey from [facility] regarding your visit today, and we are eager to use this feedback to improve your experience. Thank you for taking the time to fill it out.    Between appointments, you can reach us  at these numbers:  For appointments: [phone]  For my nurse, Darice Lever: [phone]  Fax: [phone]  For urgent issues after hours: Hospital Operator @ [phone] & ask for Pulmonary Fellow on call  For Respiratory Therapy/Airway clearance needs or questions, call respiratory therapist Jackolyn Springer [phone]    My [place] Chart is for non-urgent messages. This means you have a simple medical question that does not require an immediate response.     If you need immediate attention, call 911.     Responses may take up to 3 business days. Your message will be read by  your provider or another medical team member who may respond on your provider???s behalf.    Some questions cannot be answered through messages in My [facility] Chart. Depending on your question, your provider???s office may ask you to schedule an appointment.     Information sent through My [facility] Chart will become part of your medical record.    For further information, check out the websites below:    General Information:  [facility] Bronchiectasis/NTM Care and Research Center: [email or web address]  Information about bronchiectasis (BE): Speak Up In BE - Page for People With Bronchiectasis (speakupinbronchiectasis.com)   Information about non-tuberculous Mycobacterial Infections:  https://www.ntminfo.org   Videos from the patient session of the World Bronchiectasis Conference in [place]  [place] on October 03, 2016 can be watched here (TrustyNews.es).   Copy for Individuals with Bronchiectasis and/or NTM through the Bronchiectasis and NTM Association: ClassPreviews.com.br   [employer] NTM Patient Education Program: https://nyulangone.org/care-services/bronchiectasis-ntm-program/ntm-patient-education-program  [employer] [place] American Bronchiectasis and NTM Patient Education Program on Mar 02, 2023: https://players.BlogSelections.co.uk    Airway Clearance, Exercise, and Care:  Impact Airway Clearance Education: http://www.impact-be.com  Bronchiectasis Toolbox (information about airway clearance): DiningCalendar.de  Exercise program by [facility]hma [employer] Lung [employer]: http://www.brown.com/  Be Clear with Bronchiectasis by Rock Redo, MPH (health educator and patient): https://www.letsbecleartoday.com/    Research Opportunities:  Interested in our bronchiectasis and NTM clinical trials: https://go.ToyArticles.ca      Interested in other clinical trials and research opportunities?  www.clinicaltrials.gov  The Rare Diseases Clinical Research Network [facility]) Genetic Disorders of Mucociliary Clearance Consortium [facility]) Contact Registry is a way for patients with disorders of mucociliary clearance (such as bronchiectasis) and their family members to learn about research studies they may be able to join. Participation is completely voluntary and you may choose to withdraw at any time. There is no cost to join the [employer]. For more information or to join the registry please go to the following website:  BakersfieldOpenHouse.hu

## 2023-11-19 ENCOUNTER — Inpatient Hospital Stay: Admit: 2023-11-19 | Discharge: 2023-11-19 | Payer: Medicare (Managed Care)

## 2023-11-19 ENCOUNTER — Encounter
Admit: 2023-11-19 | Discharge: 2023-11-19 | Payer: Medicare (Managed Care) | Attending: Internal Medicine | Primary: Internal Medicine

## 2023-11-19 DIAGNOSIS — M7989 Other specified soft tissue disorders: Secondary | ICD-10-CM | POA: Diagnosis not present

## 2023-11-19 DIAGNOSIS — J9611 Chronic respiratory failure with hypoxia: Secondary | ICD-10-CM | POA: Diagnosis not present

## 2023-11-19 DIAGNOSIS — J479 Bronchiectasis, uncomplicated: Secondary | ICD-10-CM | POA: Diagnosis not present

## 2023-11-19 DIAGNOSIS — J471 Bronchiectasis with (acute) exacerbation: Secondary | ICD-10-CM | POA: Diagnosis not present

## 2023-11-19 DIAGNOSIS — A498 Other bacterial infections of unspecified site: Secondary | ICD-10-CM | POA: Diagnosis not present

## 2023-11-19 MED ORDER — CIPROFLOXACIN 750 MG TABLET
ORAL_TABLET | Freq: Two times a day (BID) | ORAL | 0 refills | 7.00000 days | Status: CP
Start: 2023-11-19 — End: 2023-11-26

## 2023-11-19 MED ORDER — AMOXICILLIN 875 MG-POTASSIUM CLAVULANATE 125 MG TABLET
ORAL_TABLET | Freq: Two times a day (BID) | ORAL | 0 refills | 7.00000 days | Status: CP
Start: 2023-11-19 — End: 2023-11-26

## 2023-11-20 DIAGNOSIS — J479 Bronchiectasis, uncomplicated: Secondary | ICD-10-CM | POA: Diagnosis not present

## 2023-11-20 DIAGNOSIS — J449 Chronic obstructive pulmonary disease, unspecified: Secondary | ICD-10-CM | POA: Diagnosis not present

## 2023-11-20 DIAGNOSIS — J9611 Chronic respiratory failure with hypoxia: Secondary | ICD-10-CM | POA: Diagnosis not present

## 2023-11-21 MED ORDER — ALBUTEROL SULFATE HFA 90 MCG/ACTUATION AEROSOL INHALER
Freq: Four times a day (QID) | RESPIRATORY_TRACT | 11 refills | 0.00000 days | Status: CP | PRN
Start: 2023-11-21 — End: 2024-11-20

## 2023-11-24 DIAGNOSIS — D485 Neoplasm of uncertain behavior of skin: Secondary | ICD-10-CM | POA: Diagnosis not present

## 2023-11-24 DIAGNOSIS — L570 Actinic keratosis: Secondary | ICD-10-CM | POA: Diagnosis not present

## 2023-11-24 DIAGNOSIS — D0422 Carcinoma in situ of skin of left ear and external auricular canal: Secondary | ICD-10-CM | POA: Diagnosis not present

## 2023-11-24 DIAGNOSIS — C4442 Squamous cell carcinoma of skin of scalp and neck: Secondary | ICD-10-CM | POA: Diagnosis not present

## 2023-11-24 DIAGNOSIS — Z85828 Personal history of other malignant neoplasm of skin: Secondary | ICD-10-CM | POA: Diagnosis not present

## 2023-11-30 ENCOUNTER — Other Ambulatory Visit (HOSPITAL_BASED_OUTPATIENT_CLINIC_OR_DEPARTMENT_OTHER): Payer: Self-pay | Admitting: *Deleted

## 2023-11-30 DIAGNOSIS — M7989 Other specified soft tissue disorders: Secondary | ICD-10-CM

## 2023-12-02 ENCOUNTER — Ambulatory Visit (HOSPITAL_BASED_OUTPATIENT_CLINIC_OR_DEPARTMENT_OTHER)
Admission: RE | Admit: 2023-12-02 | Discharge: 2023-12-02 | Disposition: A | Discharge status: Home / Self Care | Source: Ambulatory Visit | Attending: *Deleted | Admitting: *Deleted

## 2023-12-02 DIAGNOSIS — M7989 Other specified soft tissue disorders: Secondary | ICD-10-CM | POA: Diagnosis not present

## 2023-12-02 DIAGNOSIS — R600 Localized edema: Secondary | ICD-10-CM | POA: Diagnosis not present

## 2023-12-03 DIAGNOSIS — J479 Bronchiectasis, uncomplicated: Principal | ICD-10-CM

## 2023-12-03 MED ORDER — BRENSOCATIB 25 MG TABLET
ORAL_TABLET | Freq: Every day | ORAL | 11 refills | 30.00000 days | Status: CP
Start: 2023-12-03 — End: ?

## 2023-12-08 DIAGNOSIS — J479 Bronchiectasis, uncomplicated: Principal | ICD-10-CM

## 2023-12-08 MED ORDER — TOBRAMYCIN 300 MG/5 ML IN 0.225 % SODIUM CHLORIDE FOR NEBULIZATION
Freq: Two times a day (BID) | RESPIRATORY_TRACT | 5 refills | 28.00000 days
Start: 2023-12-08 — End: 2024-12-07

## 2023-12-08 NOTE — Unmapped (Signed)
 [facility] Delivery Pharm[facility] Refill Coordination Note    Specialty Medication(s) to be Shipped:   CF/Pulmonary/Asthma: Dupixent   Pulmozyme  2.5mg /2.51ml  Tobramycin  300mg /41mL, Pari nebulizer cup    Other medication(s) to be shipped: No additional medications requested for fill at this time    Specialty Medications not needed at this time: N/A     Jordan [name], DOB: 20-Jul-1955  Phone: [phone] (home)       All above HIPAA information was verified with patient.     Was a Nurse, learning disability used for this call? No    Completed refill call assessment today to schedule patient's medication shipment from the [facility] and [facility] Delivery Pharmacy  [phone]).  All relevant notes have been reviewed.     Specialty medication(s) and dose(s) confirmed: Regimen is correct and unchanged.   Changes to medications: Jordan [name] reports no changes at this time.  Changes to insurance: No  New side effects reported not previously addressed with a pharmacist or physician: None reported  Questions for the pharmacist: No    Confirmed patient received a Conservation officer, historic buildings and a Surveyor, mining with first shipment. The patient will receive a drug information handout for each medication shipped and additional FDA Medication Guides as required.       DISEASE/MEDICATION-SPECIFIC INFORMATION        For patients on injectable medications: Next injection is scheduled for 09/27.    SPECIALTY MEDICATION ADHERENCE     Medication Adherence    Patient reported X missed doses in the last month: 0  Specialty Medication: DUPIXENT  SYRINGE 200 mg/1.14 mL syringe (dupilumab )  Patient is on additional specialty medications: Yes  Additional Specialty Medications: PULMOZYME  1 mg/mL nebulizer solution (dornase alfa )  Patient Reported Additional Medication X Missed Doses in the Last Month: 0  Patient is on more than two specialty medications: Yes  Specialty Medication: tobramycin  (PF) 300 mg/5 mL nebulizer solution ([name] )  Patient Reported Additional Medication X Missed Doses in the Last Month: 0  Support network for adherence: family member              Were doses missed due to medication being on hold? No    tobramycin  (PF) 300 mg/5 mL nebulizer solution ([name] ) : 0 doses of medicine on hand *starts 10/01  DUPIXENT  SYRINGE 200 mg/1.14 mL syringe (dupilumab ) : 0 doses of medicine on hand   PULMOZYME  1 mg/mL nebulizer solution (dornase alfa ) : 10 days of medicine on hand     REFERRAL TO PHARMACIST     Referral to the pharmacist: Not needed      [facility]     Shipping address confirmed in Epic.     Cost and Payment: Patient has a $0 copay, payment information is not required.    Delivery Scheduled: Yes, Expected medication delivery date: 09/23.  However, Rx request for refills was sent to the provider as there are none remaining.     Medication will be delivered via UPS to the prescription address in Epic WAM.    Jordan [name]   [place] [facility]  Specialty Technician

## 2023-12-08 NOTE — Unmapped (Signed)
 [facility] Specialty Medication Onboarding    Specialty Medication: brinsupri  Prior Authorization: Approved   Financial Assistance: No - copay  <$25  Final Copay/Day Supply: $0 / 30    Insurance Restrictions: None     Notes to Pharmacist: None  Credit Card on File: not applicable    The triage team has completed the benefits investigation and has determined that the patient is able to fill this medication at [facility] Pharm[facility]. Please contact the patient to complete the onboarding or follow up with the prescribing physician as needed.

## 2023-12-09 MED ORDER — TOBRAMYCIN 300 MG/5 ML IN 0.225 % SODIUM CHLORIDE FOR NEBULIZATION
Freq: Two times a day (BID) | RESPIRATORY_TRACT | 5 refills | 28.00000 days | Status: CP
Start: 2023-12-09 — End: 2024-12-08
  Filled 2023-12-13: qty 280, 56d supply, fill #0

## 2023-12-09 NOTE — Unmapped (Signed)
 [facility]    Patient Onboarding/Medication Counseling    Mr.Klem is a 68 y.o. male with bronchiectasis who I am counseling today on initiation of therapy.  I am speaking to the patient.    Was a Nurse, learning disability used for this call? No    Verified patient's date of birth / HIPAA.    Specialty medication(s) to be sent: CF/Pulmonary/Asthma: Brinsupri      Non-specialty medications/supplies to be sent: (see refill note from yesterday)      Medications not needed at this time: n/a         Brinsupri (Bensocatib)    Medication & Administration     Dosage:  Take 1 tablet (25mg ) by mouth daily    Administration:   Administer orally at the same time each day.   May be administered with or without food.    Adherence/Missed dose instructions: Skip the missed dose and go back to your normal time. Do not take 2 doses at the same time or extra doses.    Goals of Therapy     Reduce risk of bronchiectasis flares  Reduce lung function decline (with 25mg  dosing only)    Side Effects & Monitoring Parameters     Commonly reported side effects  Signs of a common cold like sore throat or runny nose  Headache  Dry skin or small areas of thick skin    The following side effects should be reported to the provider:  Signs of an allergic reaction, like rash; hives; itching; red, swollen, blistered, or peeling skin with or without fever; wheezing; tightness in the chest or throat; trouble breathing, swallowing, or talking; unusual hoarseness; or swelling of the mouth, face, lips, tongue, or throat.  Signs of high blood pressure like very bad headache or dizziness, passing out, or change in eyesight.  Gum or teeth changes  New rashes or skin conditions     Monitoring Parameters: Monitor for signs of new rashes or skin conditions. It is recommended that patients perform routine dental hygiene and have regular dental checkups while taking this medicine.    Contraindications, Warnings, & Precautions     Reproductive considerations: In animal reproduction studies, no adverse developmental effects were observed with oral brensocatib administered during organogenesis at exposures <=20 times the maximum recommended human dose (MRHD).   Breastfeeding considerations: It is not known if brensocatib is present in breast milk. According to the manufacturer, the decision to breastfeed during therapy should consider the risk of infant exposure, the benefits of breastfeeding to the infant, and benefits of treatment to the mother.  Vaccines: Avoid use of live attenuated vaccines during treatment; safety or effectiveness with concomitant use is unknown.     Drug/Food Interactions     Medication list reviewed in Epic. The patient was instructed to inform the care team before taking any new medications or supplements. No drug interactions identified.     Storage, Handling Precautions, & Disposal     Store in original container at room temperature in a dry place.  Do not store in a bathroom.   Keep the lid tightly closed. Keep out of the reach of children and pets.  Do not flush down a toilet or pour down a drain unless you are told to do so.        Current Medications (including OTC/herbals), Comorbidities and Allergies     Current Medications[1]    Allergies[2]    Problem List[3]    Medication list has been  reviewed and updated in Epic: Yes    Allergies have been reviewed and updated in Epic: Yes    Appropriateness of Therapy     Acute infections noted within Epic:  MDR Pseudomonas  Patient reported infection: None    Is the medication and dose appropriate considering the patient???s diagnosis, treatment, and disease journey, comorbidities, medical history, current medications, allergies, therapeutic goals, self-administration ability, and access barriers? Yes    Prescription has been clinically reviewed: Yes      Baseline Quality of Life Assessment      How many days over the past month did your bronchiectasis  keep you from your normal activities? For example, brushing your teeth or getting up in the morning. Patient declined to answer    Financial Information     Medication Assistance provided: Prior Authorization    Anticipated copay of $0 reviewed with patient. Verified delivery address.    Delivery Information     Scheduled delivery date: 12/14/23    Expected start date: ~12/14/23      Medication will be delivered via UPS to the prescription address in [facility].  This shipment will not require a signature.      Explained the services we provide at [facility] and that each month we would call to set up refills.  Stressed importance of returning phone calls so that we could ensure they receive their medications in time each month.  Informed patient that we should be setting up refills 7-10 days prior to when they will run out of medication.  A pharmacist will reach out to perform a clinical assessment periodically.  Informed patient that a welcome packet, containing information about our pharmacy and other support services, a Notice of Privacy Practices, and a drug information handout will be sent.      The patient or caregiver noted above participated in the development of this care plan and knows that they can request review of or adjustments to the care plan at any time.      Patient or caregiver verbalized understanding of the above information as well as how to contact the pharmacy at [phone] option 4 with any questions/concerns.  The pharmacy is open Monday through Friday 8:30am-4:30pm.  A pharmacist is available 24/7 via pager to answer any clinical questions they may have.    Patient Specific Needs     Does the patient have any physical, cognitive, or cultural barriers? No    Does the patient have adequate living arrangements? (i.e. the ability to store and take their medication appropriately) Yes    Did you identify any home environmental safety or security hazards? No    Patient prefers to have medications discussed with  Patient     Is the patient or caregiver able to read and understand education materials at a high school level or above? Yes    Patient's primary language is  English     Is the patient high risk? No    Does the patient have an additional or emergency contact listed in their chart? Yes    SOCIAL DETERMINANTS OF HEALTH     At the [facility], we have learned that life circumstances - like trouble affording food, housing, utilities, or transportation can affect the health of many of our patients.   That is why we wanted to ask: are you currently experiencing any life circumstances that are negatively impacting your health and/or quality of life? Patient declined to answer  Social Drivers of Health     Food Insecurity: No Food Insecurity (07/25/2023)    Hunger Vital Sign     Worried About Running Out of Food in the Last Year: Never true     Ran Out of Food in the Last Year: Never true   Tobacco Use: Low Risk  (07/27/2023)    Patient History     Smoking Tobacco Use: Never     Smokeless Tobacco Use: Never     Passive Exposure: Not on file   Transportation Needs: No Transportation Needs (07/25/2023)    PRAPARE - Transportation     Lack of Transportation (Medical): No     Lack of Transportation (Non-Medical): No   Alcohol  Use: Not At Risk (01/22/2021)    Received from [facility]    AUDIT-C     Q1: How often do you have a drink containing alcohol ?: Never     Q2: How many drinks containing alcohol  do you have on a typical day when you are drinking?: Patient does not drink     Q3: How often do you have six or more drinks on one occasion?: Never   Housing: Low Risk  (07/25/2023)    Housing     Within the past 12 months, have you ever stayed: outside, in a car, in a tent, in an overnight shelter, or temporarily in someone else's home (i.e. couch-surfing)?: No     Are you worried about losing your housing?: No   Physical Activity: Inactive (01/22/2021)    Received from [facility]    Exercise Vital Sign     On average, how many days per week do you engage in moderate to strenuous exercise (like a brisk walk)?: 0 days     On average, how many minutes do you engage in exercise at this level?: 0 min   Utilities: Low Risk  (11/09/2022)    Utilities     Within the past 12 months, have you been unable to get utilities (heat, electricity) when it was really needed?: No   Stress: No Stress Concern Present (01/22/2021)    Received from [facility] - Occupational Stress Questionnaire     Feeling of Stress : Not at all   Interpersonal Safety: Not At Risk (07/19/2023)    Interpersonal Safety     Unsafe Where You Currently Live: No     Physically Hurt by Anyone: No     Abused by Anyone: No   Substance Use: Not on file (01/25/2023)   Intimate Partner Violence: Unknown (07/19/2023)    Humiliation, Afraid, Rape, and Kick questionnaire     Fear of Current or Ex-Partner: No     Emotionally Abused: No     Physically Abused: No     Sexually Abused: Not on file   Social Connections: Socially Integrated (01/22/2021)    Received from [facility]    Social Connection and Isolation Panel     In a typical week, how many times do you talk on the phone with family, friends, or neighbors?: More than three times a week     How often do you get together with friends or relatives?: More than three times a week     How often do you attend church or religious services?: More than 4 times per year     Do you belong to any clubs or organizations such as church groups, unions, fraternal or athletic groups, or school groups?: Yes  How often do you attend meetings of the clubs or organizations you belong to?: More than 4 times per year     Are you married, widowed, divorced, separated, never married, or living with a partner?: Married   Physicist, medical Strain: Low Risk  (07/25/2023)    Overall Financial Resource Strain (CARDIA)     Difficulty of Paying Living Expenses: Not hard at all   Health Literacy: Not on file   Internet Connectivity: Not on file       Would you be willing to receive help with any of the needs that you have identified today? Not applicable       Shelba DELENA Hummer, PharmD  [facility] Specialty Pharmacist       [1]   Current Outpatient Medications   Medication Sig Dispense Refill    albuterol  2.5 mg /3 mL (0.083 %) nebulizer solution INHALE 3 ML BY NEBULIZATION THREE (3) TIMES A DAY. 825 mL 3    albuterol  HFA 90 mcg/actuation inhaler Inhale 2 puffs four (4) times a day as needed. 18 g 11    alcohol  swabs  (ALCOHOL  PREP PADS) PadM Use as directed with inhaled antibiotics 100 each 5    ascorbic acid , vitamin C , (VITAMIN C ) 1000 MG tablet Take 1 tablet (1,000 mg total) by mouth daily.      atorvastatin  (LIPITOR ) 10 MG tablet Take 1 tablet (10 mg total) by mouth daily.      azithromycin  (ZITHROMAX ) 250 MG tablet TAKE 1 TABLET BY MOUTH EVERY DAY 90 tablet 3    brensocatib 25 mg tablet Take 1 tablet (25 mg total) by mouth daily. 30 tablet 11    cholecalciferol , vitamin D3-50 mcg, 2,000 unit,, 50 mcg (2,000 unit) tablet Take 1 tablet (50 mcg total) by mouth daily.      colistimethate  (COLYMYCIN) 150 mg injection Inject 2 mL sterile water  for injection to mix colistin vial, then draw up 2 mL (150mg ) and inhale 2 times a day, 28 days on and 28 days off. 60 each 5    dilTIAZem  (CARDIZEM  CD) 120 MG 24 hr capsule Take 1 capsule (120 mg total) by mouth daily. 90 capsule 3    dornase alfa  (PULMOZYME ) 1 mg/mL nebulizer solution Inhale 1 ampule (2.5 mg) via nebulizer daily. Use at least 30-60 minutes before airway clearance, or after airway clearance. 75 mL 11    dupilumab  (DUPIXENT  SYRINGE) 200 mg/1.14 mL syringe Inject the contents of 1 syringe (200 mg total) under the skin every fourteen (14) days. 2.3 mL 11    empty container (SHARPS CONTAINER) Misc USE AS DIRECTED 1 each 0    empty container Misc USE AS DIRECTED 1 each 2    empty container Misc Use as directed to dispose of needles. When full, make sure lid is closed tightly then dispose of container in trash. 1 each 2    ensifentrine  (OHTUVAYRE ) 3 mg/2.5 mL NbSp Inhale 3 mg two (2) times a day. 75 mL 11    EPINEPHrine  (EPIPEN ) 0.3 mg/0.3 mL injection Inject 0.3 mL (0.3 mg total) into the muscle once as needed for anaphylaxis (may repeat if needed) for up to 2 doses. 2 each 0    ferrous sulfate  325 (65 FE) MG tablet Take 1 tablet (325 mg total) by mouth Two (2) times a day. BID      fluticasone  propionate (FLONASE ) 50 mcg/actuation nasal spray 1 spray into each nostril daily.      fluticasone -umeclidin-vilanter (TRELEGY ELLIPTA ) 100-62.5-25  mcg inhaler Inhale 1 puff daily. 90 each 3    furosemide  (LASIX ) 20 MG tablet Take 1 tablet (20 mg total) by mouth daily for 10 days. 10 tablet 0    HYDROcodone -acetaminophen  (NORCO ) 7.5-325 mg per tablet Take 1 tablet by mouth three (3) times a day (at 6am, noon and 6pm).      HYDROcodone -chlorpheniramine  polistirex (TUSSIONEX PENNKINETIC ) 10-8 mg/5 mL ER suspension Take 5 mL by mouth every twelve (12) hours as needed for cough. 115 mL 0    montelukast  (SINGULAIR ) 10 mg tablet TAKE 1 TABLET BY MOUTH EVERY DAY AT NIGHT 90 tablet 3    multivitamin-minerals-lutein Tab Take 1 tablet by mouth daily.      nebulizers (LC PLUS) Misc Use with inhaled medications 1 each 5    nebulizers (LC PLUS) Misc Use as directed 4 each 3    nebulizers Misc use with nebulized medications 12 each 0    needle, disp, 21 G (BD REGULAR BEVEL NEEDLES) 21 gauge x 1 1/2 Ndle Use as directed with inhaled Colistin 100 each 3    NON FORMULARY Take 650 mg by mouth two (2) times a day. Brand: Citracal 650mg       omeprazole (PRILOSEC) 20 MG capsule Take 1 capsule (20 mg total) by mouth Two (2) times a day.      OXYGEN -AIR DELIVERY SYSTEMS MISC Inhale 2 mL.      predniSONE  (DELTASONE ) 10 MG tablet Take 40mg  (4 tabs) daily x5 day, then 30mg  (3 tabs) daily x5 day, then 20mg  (2 tabs) daily x 5 day, then 10mg  (1 tab) daily x5 day, then off 50 tablet 0    sodium chloride  (BD POSIFLUSH NORMAL SALINE 0.9) 0.9 % injection Inject 2mL of 0.9%NaCl into colistin vial & gently mix. After withdrawing colistin dose, add an additional 1mL of 0.9%NaCl to neb cup with the colistin dose. 180 mL 11    sodium chloride  10 % Nebu Measure and Inhale 5 mL by nebulization Two (2) times a day. Discard remaining amount and use a new vial for each dose. 900 mL 3    sterile water  Soln Use 2mL to mix Colistin, then add additional 2mL to neb cup with 2mL of mixed colistin. Inhale twice daily 28 days on and 28 days off. 600 mL 5    syringe with needle (BD LUER-LOK SYRINGE) 3 mL 20 gauge x 1 1/2 Syrg For use with inhaled antibiotic (Colistin) 60 each 30    syringe with needle (BD LUER-LOK SYRINGE) 3 mL 21 gauge x 1 Syrg For use with inhaled antibiotic (Colistin) 60 each 5    tobramycin , PF, ([name] ) 300 mg/5 mL nebulizer solution Inhale the contents of 1 ampule (300 mg total) by nebulization every twelve (12) hours. 28 days on and 28 days off. 280 mL 5    zinc  gluconate 50 mg (7 mg elemental zinc ) tablet Take 1 tablet (50 mg total) by mouth.      zoledronic  acid-mannitol &water  (RECLAST ) 5 mg/100 mL PgBk Infusion       No current facility-administered medications for this visit.   [2]   Allergies  Allergen Reactions    Gentamicin Other (See Comments)     BALANCE ISSUES    Tobramycin  Other (See Comments)     ototoxicity    Mirtazapine  Other (See Comments)     Lower extremity swelling    Morphine Nausea And Vomiting   [3]   Patient Active Problem List  Diagnosis    Bronchiectasis    (  CMS-HCC)    Esophagitis    Pseudomonas aeruginosa infection    Daytime somnolence    Memory loss    Hypersomnia with sleep apnea    Essential hypertension    Osteoarthrosis    Dizziness    Excessive daytime sleepiness    Sleep disturbances    Loculated pleural effusion    Moderate Pharyngeal dysphagia    IDA (iron deficiency anemia)    Disequilibrium    Gastroesophageal reflux disease without esophagitis Hoarseness    Dyspnea on exertion    Chronic hypoxemic respiratory failure    (CMS-HCC)    Hemoptysis    Pulmonary aspergilloma    (CMS-HCC)    Sensory neuropathy    Lumbosacral radiculopathy    Other osteoporosis without current pathological fracture

## 2023-12-10 MED ORDER — DILTIAZEM CD 120 MG CAPSULE,EXTENDED RELEASE 24 HR
ORAL_CAPSULE | Freq: Every day | ORAL | 3 refills | 90.00000 days | Status: CP
Start: 2023-12-10 — End: ?

## 2023-12-10 NOTE — Unmapped (Signed)
 Refill request received for patient.      Medication Requested: Diltiazem  (Cardizem  CD) 120 mg tablet  Last Office Visit: 07/19/2023   Next Office Visit: Visit date not found  Last Prescriber: Serafim Pistiolis    Nurse refill requirements met? Yes  If not met, why:     Sent to: Pharmacy per protocol  If sent to provider, which provider?:

## 2023-12-13 MED FILL — PULMOZYME 1 MG/ML SOLUTION FOR INHALATION: RESPIRATORY_TRACT | 30 days supply | Qty: 75 | Fill #7

## 2023-12-13 MED FILL — BRINSUPRI 25 MG TABLET: ORAL | 30 days supply | Qty: 30 | Fill #0

## 2023-12-13 MED FILL — DUPIXENT 200 MG/1.14 ML SUBCUTANEOUS SYRINGE: SUBCUTANEOUS | 28 days supply | Qty: 2.28 | Fill #3

## 2023-12-15 DIAGNOSIS — R0602 Shortness of breath: Principal | ICD-10-CM

## 2023-12-15 DIAGNOSIS — I471 PSVT (paroxysmal supraventricular tachycardia) (HHS-HCC): Principal | ICD-10-CM

## 2023-12-15 DIAGNOSIS — R0609 Other forms of dyspnea: Principal | ICD-10-CM

## 2023-12-15 DIAGNOSIS — R002 Palpitations: Principal | ICD-10-CM

## 2023-12-21 DIAGNOSIS — J449 Chronic obstructive pulmonary disease, unspecified: Secondary | ICD-10-CM | POA: Diagnosis not present

## 2023-12-21 DIAGNOSIS — J9611 Chronic respiratory failure with hypoxia: Secondary | ICD-10-CM | POA: Diagnosis not present

## 2023-12-21 DIAGNOSIS — J479 Bronchiectasis, uncomplicated: Secondary | ICD-10-CM | POA: Diagnosis not present

## 2023-12-23 DIAGNOSIS — J4550 Severe persistent asthma, uncomplicated: Principal | ICD-10-CM

## 2023-12-23 DIAGNOSIS — J479 Bronchiectasis, uncomplicated: Principal | ICD-10-CM

## 2023-12-23 MED ORDER — TRELEGY ELLIPTA 100 MCG-62.5 MCG-25 MCG POWDER FOR INHALATION
Freq: Every day | RESPIRATORY_TRACT | 3 refills | 90.00000 days | Status: CP
Start: 2023-12-23 — End: 2024-12-22

## 2024-01-07 NOTE — Unmapped (Signed)
 [facility] Clinical Assessment & Refill Coordination Note    Jordan [name], DOB: June 19, 1955  Phone: [phone] (home)     All above HIPAA information was verified with patient.     Was a Nurse, learning disability used for this call? No    Specialty Medication(s):   CF/Pulmonary/Asthma: Brinsupri  Dupixent   Inhaled colistin 150mg  and kit: sodium chloride  0.9%, syringes with needles, Pari nebulizer cup, alcohol  swabs   Pulmozyme  2.5mg /2.89ml  Tobramycin  300mg /2mL, Pari nebulizer cup     Current Medications[1]     Changes to medications: Jordan [name] reports no changes at this time.    Medication list has been reviewed and updated in Epic: Yes    Allergies[2]    Changes to allergies: No    Allergies have been reviewed and updated in Epic: Yes    SPECIALTY MEDICATION ADHERENCE     Brinsurpi 25 mg: 7 days of medicine on hand   Dupixent  200 mg/1.90mL: 0 doses of medicine on hand   Pulmozyme  1 mg/ml: 7 days of medicine on hand   Tobramycin  300 mg/19mL: ~12 days of medicine on hand   Colistin 150 mg: 0 days of medicine on hand (next cycle starts 11/1)    Medication Adherence    Patient reported X missed doses in the last month: 0  Specialty Medication: Brinsupri 25mg  daily  Patient is on additional specialty medications: Yes  Additional Specialty Medications: Dupixent  200mg /1.9mL Q14d  Patient Reported Additional Medication X Missed Doses in the Last Month: 0  Patient is on more than two specialty medications: Yes  Specialty Medication: Pulmozyme  1 mg/mL daily  Patient Reported Additional Medication X Missed Doses in the Last Month: 0  Specialty Medication: Tobramycin  300mg /74mL BID, alternates monthly with colistin  Patient Reported Additional Medication X Missed Doses in the Last Month: 0  Specialty Medication: Colsitin 150 mg BID, alternates monthly with tobramycin   Patient Reported Additional Medication X Missed Doses in the Last Month: 0  Any gaps in refill history greater than 2 weeks in the last 3 months: no  Demonstrates understanding of importance of adherence: yes  Informant: patient  Support network for adherence: family member          Specialty medication(s) dose(s) confirmed: Regimen is correct and unchanged.     Are there any concerns with adherence? No    Adherence counseling provided? Not needed    CLINICAL MANAGEMENT AND INTERVENTION      Clinical Benefit Assessment:    Do you feel the medicine is effective or helping your condition? Yes    Clinical Benefit counseling provided? Not needed    Adverse Effects Assessment:    Are you experiencing any side effects? No    Are you experiencing difficulty administering your medicine? No    Quality of Life Assessment:    Quality of Life    Rheumatology  Oncology  Dermatology  Cystic Fibrosis          How many days over the past month did your bronchiectasis  keep you from your normal activities? For example, brushing your teeth or getting up in the morning. Patient declined to answer    Have you discussed this with your provider? Not needed    Acute Infection Status:    Acute infections noted within Epic:  MDR Pseudomonas    Patient reported infection: None    Therapy Appropriateness:    Is the medication and dose appropriate considering the patient???s diagnosis, treatment, and disease journey, comorbidities, medical history,  current medications, allergies, therapeutic goals, self-administration ability, and access barriers? Yes, therapy is appropriate and should be continued     Clinical Intervention:    Was an intervention completed as part of this clinical assessment? No    DISEASE/MEDICATION-SPECIFIC INFORMATION      For patients on injectable medications: Next injection is scheduled for 10/25.    Other Pulmonary Conditions: Not Applicable    PATIENT SPECIFIC NEEDS     Does the patient have any physical, cognitive, or cultural barriers? No    Is the patient high risk? No    Does the patient require physician intervention or other additional services (i.e., nutrition, smoking cessation, social work)? No    Does the patient have an additional or emergency contact listed in their chart? Yes    SOCIAL DETERMINANTS OF HEALTH     At the [facility], we have learned that life circumstances - like trouble affording food, housing, utilities, or transportation can affect the health of many of our patients.   That is why we wanted to ask: are you currently experiencing any life circumstances that are negatively impacting your health and/or quality of life? Patient declined to answer    Social Drivers of Health     Food Insecurity: No Food Insecurity (07/25/2023)    Hunger Vital Sign     Worried About Running Out of Food in the Last Year: Never true     Ran Out of Food in the Last Year: Never true   Tobacco Use: Low Risk  (07/27/2023)    Patient History     Smoking Tobacco Use: Never     Smokeless Tobacco Use: Never     Passive Exposure: Not on file   Transportation Needs: No Transportation Needs (07/25/2023)    PRAPARE - Transportation     Lack of Transportation (Medical): No     Lack of Transportation (Non-Medical): No   Alcohol  Use: Not At Risk (01/22/2021)    Received from [facility]    AUDIT-C     Q1: How often do you have a drink containing alcohol ?: Never     Q2: How many drinks containing alcohol  do you have on a typical day when you are drinking?: Patient does not drink     Q3: How often do you have six or more drinks on one occasion?: Never   Housing: Low Risk  (07/25/2023)    Housing     Within the past 12 months, have you ever stayed: outside, in a car, in a tent, in an overnight shelter, or temporarily in someone else's home (i.e. couch-surfing)?: No     Are you worried about losing your housing?: No   Physical Activity: Inactive (01/22/2021)    Received from [facility]    Exercise Vital Sign     On average, how many days per week do you engage in moderate to strenuous exercise (like a brisk walk)?: 0 days     On average, how many minutes do you engage in exercise at this level?: 0 min   Utilities: Low Risk  (11/09/2022)    Utilities     Within the past 12 months, have you been unable to get utilities (heat, electricity) when it was really needed?: No   Stress: No Stress Concern Present (01/22/2021)    Received from [facility] - Occupational Stress Questionnaire     Feeling of Stress : Not at all   Interpersonal Safety:  Not At Risk (07/19/2023)    Interpersonal Safety     Unsafe Where You Currently Live: No     Physically Hurt by Anyone: No     Abused by Anyone: No   Substance Use: Not on file (01/25/2023)   Intimate Partner Violence: Unknown (07/19/2023)    Humiliation, Afraid, Rape, and Kick questionnaire     Fear of Current or Ex-Partner: No     Emotionally Abused: No     Physically Abused: No     Sexually Abused: Not on file   Social Connections: Socially Integrated (01/22/2021)    Received from [facility]    Social Connection and Isolation Panel     In a typical week, how many times do you talk on the phone with family, friends, or neighbors?: More than three times a week     How often do you get together with friends or relatives?: More than three times a week     How often do you attend church or religious services?: More than 4 times per year     Do you belong to any clubs or organizations such as church groups, unions, fraternal or athletic groups, or school groups?: Yes     How often do you attend meetings of the clubs or organizations you belong to?: More than 4 times per year     Are you married, widowed, divorced, separated, never married, or living with a partner?: Married   Physicist, medical Strain: Low Risk  (07/25/2023)    Overall Financial Resource Strain (CARDIA)     Difficulty of Paying Living Expenses: Not hard at all   Health Literacy: Not on file   Internet Connectivity: Not on file       Would you be willing to receive help with any of the needs that you have identified today? Not applicable       SHIPPING Specialty Medication(s) to be Shipped:   CF/Pulmonary/Asthma: Brinsupri  Dupixent   Inhaled colistin 75mg  and kit: sodium chloride  0.9%, syringes with needles, Pari nebulizer cup, alcohol  swabs   Pulmozyme  2.5mg /2.38ml    Other medication(s) to be shipped: No additional medications requested for fill at this time    Specialty Medications not needed at this time: CF/Pulmonary/Asthma: Tobramycin  300mg /18mL, Pari nebulizer cup     Changes to insurance: No    Cost and Payment: Patient has a $0 copay, payment information is not required.    Delivery Scheduled: Yes, Expected medication delivery date: 01/12/24.     Medication will be delivered via UPS to the confirmed prescription address in [facility].    The patient will receive a drug information handout for each medication shipped and additional FDA Medication Guides as required.  Verified that patient has previously received a Conservation officer, historic buildings and a Surveyor, mining.    The patient or caregiver noted above participated in the development of this care plan and knows that they can request review of or adjustments to the care plan at any time.      All of the patient's questions and concerns have been addressed.    Shelba DELENA Hummer, PharmD   [facility] Specialty Pharmacist       [1]   Current Outpatient Medications   Medication Sig Dispense Refill    albuterol  2.5 mg /3 mL (0.083 %) nebulizer solution INHALE 3 ML BY NEBULIZATION THREE (3) TIMES A DAY. 825 mL 3    albuterol  HFA 90 mcg/actuation inhaler Inhale 2 puffs  four (4) times a day as needed. 18 g 11    alcohol  swabs  (ALCOHOL  PREP PADS) PadM Use as directed with inhaled antibiotics 100 each 5    ascorbic acid , vitamin C , (VITAMIN C ) 1000 MG tablet Take 1 tablet (1,000 mg total) by mouth daily.      atorvastatin  (LIPITOR ) 10 MG tablet Take 1 tablet (10 mg total) by mouth daily.      azithromycin  (ZITHROMAX ) 250 MG tablet TAKE 1 TABLET BY MOUTH EVERY DAY 90 tablet 3 brensocatib 25 mg tablet Take 1 tablet (25 mg total) by mouth daily. 30 tablet 11    cholecalciferol , vitamin D3-50 mcg, 2,000 unit,, 50 mcg (2,000 unit) tablet Take 1 tablet (50 mcg total) by mouth daily.      colistimethate  (COLYMYCIN) 150 mg injection Inject 2 mL sterile water  for injection to mix colistin vial, then draw up 2 mL (150mg ) and inhale 2 times a day, 28 days on and 28 days off. 60 each 5    dilTIAZem  (CARDIZEM  CD) 120 MG 24 hr capsule TAKE 1 CAPSULE BY MOUTH EVERY DAY 90 capsule 3    dornase alfa  (PULMOZYME ) 1 mg/mL nebulizer solution Inhale 1 ampule (2.5 mg) via nebulizer daily. Use at least 30-60 minutes before airway clearance, or after airway clearance. 75 mL 11    dupilumab  (DUPIXENT  SYRINGE) 200 mg/1.14 mL syringe Inject the contents of 1 syringe (200 mg total) under the skin every fourteen (14) days. 2.3 mL 11    empty container (SHARPS CONTAINER) Misc USE AS DIRECTED 1 each 0    empty container Misc USE AS DIRECTED 1 each 2    empty container Misc Use as directed to dispose of needles. When full, make sure lid is closed tightly then dispose of container in trash. 1 each 2    ensifentrine  (OHTUVAYRE ) 3 mg/2.5 mL NbSp Inhale 3 mg two (2) times a day. 75 mL 11    EPINEPHrine  (EPIPEN ) 0.3 mg/0.3 mL injection Inject 0.3 mL (0.3 mg total) into the muscle once as needed for anaphylaxis (may repeat if needed) for up to 2 doses. 2 each 0    ferrous sulfate  325 (65 FE) MG tablet Take 1 tablet (325 mg total) by mouth Two (2) times a day. BID      fluticasone  propionate (FLONASE ) 50 mcg/actuation nasal spray 1 spray into each nostril daily.      fluticasone -umeclidin-vilanter (TRELEGY ELLIPTA ) 100-62.5-25 mcg inhaler INHALE 1 PUFF DAILY 90 each 3    furosemide  (LASIX ) 20 MG tablet Take 1 tablet (20 mg total) by mouth daily for 10 days. 10 tablet 0    HYDROcodone -acetaminophen  (NORCO ) 7.5-325 mg per tablet Take 1 tablet by mouth three (3) times a day (at 6am, noon and 6pm). HYDROcodone -chlorpheniramine  polistirex (TUSSIONEX PENNKINETIC ) 10-8 mg/5 mL ER suspension Take 5 mL by mouth every twelve (12) hours as needed for cough. 115 mL 0    montelukast  (SINGULAIR ) 10 mg tablet TAKE 1 TABLET BY MOUTH EVERY DAY AT NIGHT 90 tablet 3    multivitamin-minerals-lutein Tab Take 1 tablet by mouth daily.      nebulizers (LC PLUS) Misc Use with inhaled medications 1 each 5    nebulizers (LC PLUS) Misc Use as directed 4 each 3    nebulizers Misc use with nebulized medications 12 each 0    needle, disp, 21 G (BD REGULAR BEVEL NEEDLES) 21 gauge x 1 1/2 Ndle Use as directed with inhaled Colistin 100 each 3    NON FORMULARY Take  650 mg by mouth two (2) times a day. Brand: Citracal 650mg       omeprazole (PRILOSEC) 20 MG capsule Take 1 capsule (20 mg total) by mouth Two (2) times a day.      OXYGEN -AIR DELIVERY SYSTEMS MISC Inhale 2 mL.      predniSONE  (DELTASONE ) 10 MG tablet Take 40mg  (4 tabs) daily x5 day, then 30mg  (3 tabs) daily x5 day, then 20mg  (2 tabs) daily x 5 day, then 10mg  (1 tab) daily x5 day, then off 50 tablet 0    sodium chloride  (BD POSIFLUSH NORMAL SALINE 0.9) 0.9 % injection Inject 2mL of 0.9%NaCl into colistin vial & gently mix. After withdrawing colistin dose, add an additional 1mL of 0.9%NaCl to neb cup with the colistin dose. 180 mL 11    sodium chloride  10 % Nebu Measure and Inhale 5 mL by nebulization Two (2) times a day. Discard remaining amount and use a new vial for each dose. 900 mL 3    sterile water  Soln Use 2mL to mix Colistin, then add additional 2mL to neb cup with 2mL of mixed colistin. Inhale twice daily 28 days on and 28 days off. 600 mL 5    syringe with needle (BD LUER-LOK SYRINGE) 3 mL 20 gauge x 1 1/2 Syrg For use with inhaled antibiotic (Colistin) 60 each 30    syringe with needle (BD LUER-LOK SYRINGE) 3 mL 21 gauge x 1 Syrg For use with inhaled antibiotic (Colistin) 60 each 5    tobramycin , PF, ([name] ) 300 mg/5 mL nebulizer solution Inhale the contents of 1 ampule (300 mg total) by nebulization every twelve (12) hours. 28 days on and 28 days off. 280 mL 5    zinc  gluconate 50 mg (7 mg elemental zinc ) tablet Take 1 tablet (50 mg total) by mouth.      zoledronic  acid-mannitol &water  (RECLAST ) 5 mg/100 mL PgBk Infusion       No current facility-administered medications for this visit.   [2]   Allergies  Allergen Reactions    Gentamicin Other (See Comments)     BALANCE ISSUES    Tobramycin  Other (See Comments)     ototoxicity    Mirtazapine  Other (See Comments)     Lower extremity swelling    Morphine Nausea And Vomiting

## 2024-01-11 MED FILL — EMPTY CONTAINER: 120 days supply | Qty: 1 | Fill #1

## 2024-01-11 MED FILL — BD REGULAR BEVEL NEEDLES 21 GAUGE X 1 1/2": 30 days supply | Qty: 60 | Fill #5

## 2024-01-11 MED FILL — BD LUER-LOK SYRINGE 3 ML 21 GAUGE X 1": ORAL | 30 days supply | Qty: 60 | Fill #2

## 2024-01-11 MED FILL — PULMOZYME 1 MG/ML SOLUTION FOR INHALATION: RESPIRATORY_TRACT | 30 days supply | Qty: 75 | Fill #8

## 2024-01-11 MED FILL — BRINSUPRI 25 MG TABLET: ORAL | 30 days supply | Qty: 30 | Fill #1

## 2024-01-11 MED FILL — DUPIXENT 200 MG/1.14 ML SUBCUTANEOUS SYRINGE: SUBCUTANEOUS | 28 days supply | Qty: 2.28 | Fill #4

## 2024-01-11 MED FILL — COLISTIN (COLISTIMETHATE SODIUM) 150 MG SOLUTION FOR INJECTION: INTRAMUSCULAR | 60 days supply | Qty: 60 | Fill #3

## 2024-01-11 MED FILL — BD POSIFLUSH NORMAL SALINE 0.9 % INJECTION SYRINGE: 30 days supply | Qty: 180 | Fill #3

## 2024-01-11 MED FILL — LC PLUS MISC: 30 days supply | Qty: 2 | Fill #5

## 2024-01-13 DIAGNOSIS — J479 Bronchiectasis, uncomplicated: Principal | ICD-10-CM

## 2024-01-17 ENCOUNTER — Ambulatory Visit: Admit: 2024-01-17 | Discharge: 2024-01-17 | Payer: Medicare (Managed Care)

## 2024-01-17 ENCOUNTER — Inpatient Hospital Stay: Admit: 2024-01-17 | Discharge: 2024-01-17 | Payer: Medicare (Managed Care)

## 2024-01-17 DIAGNOSIS — I351 Nonrheumatic aortic (valve) insufficiency: Secondary | ICD-10-CM | POA: Diagnosis not present

## 2024-01-17 DIAGNOSIS — I471 Supraventricular tachycardia, unspecified: Secondary | ICD-10-CM | POA: Diagnosis not present

## 2024-01-17 DIAGNOSIS — R002 Palpitations: Secondary | ICD-10-CM | POA: Diagnosis not present

## 2024-01-17 DIAGNOSIS — R0609 Other forms of dyspnea: Secondary | ICD-10-CM | POA: Diagnosis not present

## 2024-01-17 DIAGNOSIS — R0602 Shortness of breath: Secondary | ICD-10-CM | POA: Diagnosis not present

## 2024-01-17 DIAGNOSIS — R600 Localized edema: Secondary | ICD-10-CM | POA: Diagnosis not present

## 2024-01-17 NOTE — Progress Notes (Signed)
 DIVISION OF CARDIOLOGY  [facility] , [place] [place]        Date of Service: 01/17/2024    PCP: Referring Provider:   Frederik Lamar POUR, MD  [address] [place] [address]  Phone: [phone]  Fax: [phone] Pcp, None Per Patient  [address]  [place],  [place] [address]  Phone: N/A  Fax:      Assessment and Plan:   # Paroxysmal Supraventricular Tachycardia  # Palpitations - Resolved.   Mr. Doren is a 68 year old M with extensive pulmonary disease; bronchiectasis, chronic pseudomonal colonization, asperilloma, chronic hypoxic respiratory failure. He feels his disease is progressing with worsening breathlessness and coughing.  He uses 2L O2 on exertion and while sleeping. CV risk factors include HTN, OSA, age, male gender.  He has a prior dx of PSVT  late 2022 while in pulm rehab wearing a monitor.  Zio patch 10/2022 without any evidence of AFib, just short runs of SVT. He is on Dilt 120 ER for BP control and suppression of SVTs / palpitations.      Has a high bleed risk and has had hemoptysis with PA artery embolization-  as such, AC in Mr. Castille would not be benign. If AF is found, we spoke about a possible Watchman procedure, wihich he is ammendable to to help decrease his risk of CVA.     TTE 12/2021 reassuringly showed a preserved L and R heart function.  As such, I do not see an need for a standing diuretic.     Plan:  -Continue Dilt 120 ER for HTN and addition of AV nodal blockade (SVTs).   -If AF is ever detected, consider Watchman given elevated bleed risk given prior pulmonary artery embolization.   -We spoke about a loop recorder but will proceed with symptom monitoring at this time.     # Lower extremity Edema   # Chronic shortness of breath on exertion  # Chronic hypoxic respiratory failure on 2L at night and w/ activity  # Severe bronchiectasis and pseudomonal colonization.   Worsening BL LE edema L>>R. Per pt DVT US  negative. Gained 7-10 lbs over the last 1-2 months. On exam warm lower extremities with 3+ LE edema L>>R. IVC on TTE interestingly not indicative of significant intravascular fluid. Pt notes LE edema is lasix  responsive.     Will increase lasix  for 5 d to BID and proceed with CT Venogram to assess for other causes of edema.   Plan:  -Increase lasix  to 20 BID   -B/L CT venogram.     Case was seen and staffed with Dr. Brock.    Primary prevention parameters:  - The 10-year ASCVD risk score (Arnett DK, et al., 2019) is: 16.4%    Values used to calculate the score:      Age: 26 years      Clinically relevant sex: Male      Is Non-Hispanic African American: No      Diabetic: No      Tobacco smoker: No      Systolic Blood Pressure: 125 mmHg      Is BP treated: Yes      HDL Cholesterol: 44 mg/dL      Total Cholesterol: 137 mg/dL    Note: For patients with SBP <90 or >200, Total Cholesterol <130 or >320, HDL <20 or >100 which are outside of the allowable range, the calculator will use these upper or lower values  to calculate the patient???s risk score.    -   Lab Results   Component Value Date    LDL 76 01/27/2023       -   Lab Results   Component Value Date    A1C 5.3 01/05/2022     -   Lab Results   Component Value Date    CREATININE 1.02 08/03/2023     No follow-ups on file.    Subjective:        Reason for Consultation: PSVT    History of Present Illness: Mr. Spickler is a very pleasant 68 y.o. male with a history of HTN, OSA, brochiectasis c/b chronic pseudomonas colonization and chronic hypoxic respiratory failure req 2L O2 with activity, history of RML lobectomy/RLL wedge recection, RUL aspergillooma .The patient is seen at the request of None Per Patient Pcp for evaluation of PSVT (Afib) which was discovered late 2022 during pulmonary rehab.    RPA embolization in December 2022 for hemoptysis.       Cardiology is asked to see Mr. Schembri for a history of paroxysmal SVT.  Unclear which SVT this is as it has not been captured on Zio or ECG.  CT-A Chest 03/2021 demonstrated extensive bronchiectasis and signs of ongoing endobronchial infection.  Per chart review, PSVT managed with Dilt ER 120mg . While in pulmonary rehab 8/11, notes of 40 events of fast rate or afib.      October 3rd. Dilt ER 120.     No HF or CHF. He uses 2-3 thin pillows at night and has not changed them recently.  With oxygen  he is able to do yard work. Even with O2 he has to takes breaks and has it down to 87%. 2L at home , 4L with activity. No CP or Chest heaviness.     He is unaware that he is in afib. He would see the pulm rehab monitor tell him he was in Afib.  His most recent hospital stays he was not in AFib and his heart rate was always below 90s. No Paplps, or pre-syncoal features.      Interval: 12/2023  More hypoxic events tachycardia taking lionger to resolve  Having worse LE edema. Taking Lasix  20 once daily.   Wt at home 160s > 179. Since May over the last 5 months. He tries elevating them. Coughs when he does.   Started Lasix  20 mg he was on BID. Gained  0.5-1 lb daily.      History of Present Illness    SOCIAL:   He denies any history of smoking or drug use and reports minimal alcohol  consumption in the past. He lives at home with his wife in [place].      Cardiovascular History:  PSVT, unclear type. Likely MAT vs. AT     Fam Hx:  Father - CHF (late 45s).   Mother - AFib    I have independently reviewed of all the diagnostic studies. I have reviewed the old medical records from [facility] prior to this clinic visit.     Cardiovascular Studies Date Comments     ECG 12/22/2021 NSR. LAFB.   Echo 01/12/2022 Summary    1. Technically difficult study due to chest wall/lung interference.  No  parasternal windows available.    2. The left ventricular systolic function is normal, LVEF is visually  estimated at 55%.    3. There is mild aortic regurgitation.    4. The right ventricle is normal in size, with normal  systolic function.   Stress test     Cardiac catheterization     CYP2C19 Genotype Electrophysiology      Cardiovascular Surgery     Peripheral Vascular Studies     I have personally reviewed the recent imaging studies on this patient.    Past medical history:  Patient Active Problem List   Diagnosis    Bronchiectasis (CMS-HCC)    Esophagitis    Pseudomonas aeruginosa infection    Daytime somnolence    Memory loss    Hypersomnia with sleep apnea    Essential hypertension    Osteoarthrosis    Dizziness    Excessive daytime sleepiness    Sleep disturbances    Loculated pleural effusion    Moderate Pharyngeal dysphagia    IDA (iron deficiency anemia)    Disequilibrium    Gastroesophageal reflux disease without esophagitis    Hoarseness    Dyspnea on exertion    Chronic hypoxemic respiratory failure    (CMS-HCC)    Hemoptysis    Pulmonary aspergilloma    (CMS-HCC)    Sensory neuropathy    Lumbosacral radiculopathy    Other osteoporosis without current pathological fracture       Medications:   Patient's Medications   New Prescriptions    No medications on file   Previous Medications    ALBUTEROL  2.5 MG /3 ML (0.083 %) NEBULIZER SOLUTION    INHALE 3 ML BY NEBULIZATION THREE (3) TIMES A DAY.    ALBUTEROL  HFA 90 MCG/ACTUATION INHALER    Inhale 2 puffs four (4) times a day as needed.    ALCOHOL  SWABS  (ALCOHOL  PREP PADS) PADM    Use as directed with inhaled antibiotics    ASCORBIC ACID , VITAMIN C , (VITAMIN C ) 1000 MG TABLET    Take 1 tablet (1,000 mg total) by mouth daily.    ATORVASTATIN  (LIPITOR ) 10 MG TABLET    Take 1 tablet (10 mg total) by mouth daily.    AZITHROMYCIN  (ZITHROMAX ) 250 MG TABLET    TAKE 1 TABLET BY MOUTH EVERY DAY    BRENSOCATIB 25 MG TABLET    Take 1 tablet (25 mg total) by mouth daily.    CALCIUM  CARBONATE 1,500 MG (600 MG ELEM CALCIUM ) TABLET    1 tablet with meals Orally Twice a day; Duration: 30 day(s)    CHOLECALCIFEROL , VITAMIN D3-50 MCG, 2,000 UNIT,, 50 MCG (2,000 UNIT) TABLET    Take 1 tablet (50 mcg total) by mouth daily.    COLISTIMETHATE  (COLYMYCIN) 150 MG INJECTION Inject 2 mL sterile water  for injection to mix colistin vial, then draw up 2 mL (150mg ) and inhale 2 times a day, 28 days on and 28 days off.    DILTIAZEM  (CARDIZEM  CD) 120 MG 24 HR CAPSULE    TAKE 1 CAPSULE BY MOUTH EVERY DAY    DORNASE ALFA  (PULMOZYME ) 1 MG/ML NEBULIZER SOLUTION    Inhale 1 ampule (2.5 mg) via nebulizer daily. Use at least 30-60 minutes before airway clearance, or after airway clearance.    DUPILUMAB  (DUPIXENT  SYRINGE) 200 MG/1.14 ML SYRINGE    Inject the contents of 1 syringe (200 mg total) under the skin every fourteen (14) days.    EMPTY CONTAINER (SHARPS CONTAINER) MISC    USE AS DIRECTED    EMPTY CONTAINER MISC    USE AS DIRECTED    EMPTY CONTAINER MISC    Use as directed to dispose of needles. When full, make sure lid is closed tightly then dispose of container  in trash.    ENSIFENTRINE  (OHTUVAYRE ) 3 MG/2.5 ML NBSP    Inhale 3 mg two (2) times a day.    EPINEPHRINE  (EPIPEN ) 0.3 MG/0.3 ML INJECTION    Inject 0.3 mL (0.3 mg total) into the muscle once as needed for anaphylaxis (may repeat if needed) for up to 2 doses.    FERROUS SULFATE  325 (65 FE) MG TABLET    Take 1 tablet (325 mg total) by mouth Two (2) times a day. BID    FLUTICASONE  PROPIONATE (FLONASE ) 50 MCG/ACTUATION NASAL SPRAY    1 spray into each nostril daily.    FLUTICASONE -UMECLIDIN-VILANTER (TRELEGY ELLIPTA ) 100-62.5-25 MCG INHALER    INHALE 1 PUFF DAILY    FUROSEMIDE  (LASIX ) 20 MG TABLET    Take 1 tablet (20 mg total) by mouth daily for 10 days.    HYDROCODONE -ACETAMINOPHEN  (NORCO ) 7.5-325 MG PER TABLET    Take 1 tablet by mouth three (3) times a day (at 6am, noon and 6pm).    HYDROCODONE -CHLORPHENIRAMINE  POLISTIREX (TUSSIONEX PENNKINETIC ) 10-8 MG/5 ML ER SUSPENSION    Take 5 mL by mouth every twelve (12) hours as needed for cough.    MONTELUKAST  (SINGULAIR ) 10 MG TABLET    TAKE 1 TABLET BY MOUTH EVERY DAY AT NIGHT    MULTIVITAMIN-MINERALS-LUTEIN TAB    Take 1 tablet by mouth daily.    NEBULIZERS (LC PLUS) MISC    Use with inhaled medications    NEBULIZERS (LC PLUS) MISC    Use as directed    NEBULIZERS MISC    use with nebulized medications    NEEDLE, DISP, 21 G (BD REGULAR BEVEL NEEDLES) 21 GAUGE X 1 1/2 NDLE    Use as directed with inhaled Colistin    NON FORMULARY    Take 650 mg by mouth two (2) times a day. Brand: Citracal 650mg     OMEPRAZOLE (PRILOSEC) 20 MG CAPSULE    Take 1 capsule (20 mg total) by mouth Two (2) times a day.    OXYGEN -AIR DELIVERY SYSTEMS MISC    Inhale 2 mL.    PREDNISONE  (DELTASONE ) 10 MG TABLET    Take 40mg  (4 tabs) daily x5 day, then 30mg  (3 tabs) daily x5 day, then 20mg  (2 tabs) daily x 5 day, then 10mg  (1 tab) daily x5 day, then off    SODIUM CHLORIDE  (BD POSIFLUSH NORMAL SALINE 0.9) 0.9 % INJECTION    Inject 2mL of 0.9%NaCl into colistin vial & gently mix. After withdrawing colistin dose, add an additional 1mL of 0.9%NaCl to neb cup with the colistin dose.    SODIUM CHLORIDE  10 % NEBU    Measure and Inhale 5 mL by nebulization Two (2) times a day. Discard remaining amount and use a new vial for each dose.    STERILE WATER  SOLN    Use 2mL to mix Colistin, then add additional 2mL to neb cup with 2mL of mixed colistin. Inhale twice daily 28 days on and 28 days off.    SYRINGE WITH NEEDLE (BD LUER-LOK SYRINGE) 3 ML 20 GAUGE X 1 1/2 SYRG    For use with inhaled antibiotic (Colistin)    SYRINGE WITH NEEDLE (BD LUER-LOK SYRINGE) 3 ML 21 GAUGE X 1 SYRG    For use with inhaled antibiotic (Colistin)    TOBRAMYCIN , PF, ([name] ) 300 MG/5 ML NEBULIZER SOLUTION    Inhale the contents of 1 ampule (300 mg total) by nebulization every twelve (12) hours. 28 days on and 28 days off.    ZINC   GLUCONATE 50 MG (7 MG ELEMENTAL ZINC ) TABLET    Take 1 tablet (50 mg total) by mouth.    ZOLEDRONIC  ACID-MANNITOL &WATER  (RECLAST ) 5 MG/100 ML PGBK    Infusion   Modified Medications    No medications on file   Discontinued Medications    No medications on file       Allergies:  Allergies   Allergen Reactions    Gentamicin Other (See Comments)     BALANCE ISSUES    Tobramycin  Other (See Comments)     ototoxicity    Mirtazapine  Other (See Comments)     Lower extremity swelling    Morphine Nausea And Vomiting       Social History:  He  reports that he has never smoked. He has never used smokeless tobacco. He reports that he does not drink alcohol  and does not use drugs.    Family History:  His family history includes Alzheimer's disease in his mother; Asthma in his son; Bronchiectasis  in his brother, brother, and daughter; Diabetes in his father; Heart failure in his father; Liver disease in his father; Stroke in his mother.    Review of Systems  10 systems were reviewed and negative except as noted in HPI.      Objective:       Physical Exam  BP 125/61 (BP Site: L Arm, BP Position: Sitting, BP Cuff Size: Medium)  - Pulse 84  - Ht 169.7 cm (5' 6.81)  - Wt 86.1 kg (189 lb 12.8 oz)  - SpO2 90%  - BMI 29.90 kg/m??    Wt Readings from Last 3 Encounters:   01/17/24 86.1 kg (189 lb 12.8 oz)   11/19/23 83 kg (183 lb)   07/23/23 80.7 kg (178 lb)     General:  Alert, no distress. Wife at side.    Eyes:  Intact, sclerae anicteric.   Neck: No carotid bruit.    Respiratory:   CTAB bilaterally with normal WOB.   Cardiovascular:  JVD not visitible at 90 degrees, RRR without m/r/g.  trace edema bilaterally.     Most recent labs   Lab Results   Component Value Date    Sodium 142 08/03/2023    Sodium Whole Blood 137 03/24/2015    Potassium 4.0 08/03/2023    Potassium, Bld 3.9 03/24/2015    Chloride 97 (L) 08/03/2023    Chloride 93 (L) 08/13/2013    CO2 34.4 (H) 08/03/2023    CO2 32 (H) 08/13/2013    BUN 34 (H) 08/03/2023    BUN 24 (H) 08/13/2013    Creatinine Whole Blood, POC 1.2 04/15/2021    Creatinine Whole Blood, POC 0.4 (L) 06/12/2014    Creatinine 1.02 08/03/2023    Magnesium  1.5 (L) 12/22/2021    Magnesium  2.0 08/13/2013     Lab Results   Component Value Date    HGB 12.9 08/02/2023    Hemoglobin 14.8 03/24/2015    MCV 90.9 08/02/2023    MCV 88 08/13/2013 Platelet 174 08/02/2023    Platelet 292 08/13/2013     Lab Results   Component Value Date    Cholesterol, Total 137 01/27/2023    Triglycerides 86 01/27/2023    Cholesterol, HDL 44 01/27/2023    Cholesterol, Non-HDL, Calculated 93 01/27/2023    Cholesterol, LDL, Calculated 76 01/27/2023    Hemoglobin A1C 5.3 01/05/2022    TSH 1.334 10/14/2021    PRO-BNP 721.0 (H) 01/26/2023    INR 0.95 04/14/2017    INR  1.0 08/03/2013          A total of 25 minutes was spent on this visit reviewing previous notes, counseling the patient on this clinical conditions, ordering tests , adjusting meds, and documenting the findings in the note.     Patient note was created using dragon dictation. Any errors in syntax or proofreading may not have been identified and edited on initial review prior to signing this note.    Annalee Peon, M.D.  [facility] Cardiology Fellow, PGY-5

## 2024-01-17 NOTE — Patient Instructions (Addendum)
 It was nice seeing you.     We will double your lasix  until Friday. Please have your PCP check your blood on that day.     We will see you back in 6 months.     If you continue to have worsening swelling - we can always get a RHC.       It was a pleasure seeing you in clinic today!  You will find plans for testing or medications changes in this summary. Results of testing can be viewed via MyChart. Some test results may be discussed with you over the phone. If you do not have MyChart, you can sign up at [email or web address] Please contact us  via MyChart or by phone with any questions or concerns.     If imaging has been ordered, please call [facility] Imaging Department at [phone] to schedule your appointment.     CONTACT:  Montie Boers General Cardiology Coordinator  Coordinator phone number/Voicemail:  [phone]

## 2024-01-18 DIAGNOSIS — C61 Malignant neoplasm of prostate: Secondary | ICD-10-CM | POA: Diagnosis not present

## 2024-01-20 DIAGNOSIS — J479 Bronchiectasis, uncomplicated: Secondary | ICD-10-CM | POA: Diagnosis not present

## 2024-01-20 DIAGNOSIS — J9611 Chronic respiratory failure with hypoxia: Secondary | ICD-10-CM | POA: Diagnosis not present

## 2024-01-24 DIAGNOSIS — Z79899 Other long term (current) drug therapy: Secondary | ICD-10-CM | POA: Diagnosis not present

## 2024-01-24 DIAGNOSIS — R609 Edema, unspecified: Secondary | ICD-10-CM | POA: Diagnosis not present

## 2024-01-24 NOTE — Progress Notes (Signed)
Attending Physician Attestation:      I confirm that I interviewed and examined the patient, reviewed all the relevant lab and diagnostic data, and we agreed upon a plan of treatment. I agree with the findings, assessment, and plan as documented by Dr. Maryruth Hancock.    Vella Raring, MD

## 2024-01-25 DIAGNOSIS — C61 Malignant neoplasm of prostate: Secondary | ICD-10-CM | POA: Diagnosis not present

## 2024-01-25 DIAGNOSIS — N5201 Erectile dysfunction due to arterial insufficiency: Secondary | ICD-10-CM | POA: Diagnosis not present

## 2024-01-25 DIAGNOSIS — N393 Stress incontinence (female) (male): Secondary | ICD-10-CM | POA: Diagnosis not present

## 2024-01-28 ENCOUNTER — Inpatient Hospital Stay: Admit: 2024-01-28 | Discharge: 2024-01-28 | Payer: Medicare (Managed Care)

## 2024-01-28 DIAGNOSIS — M7989 Other specified soft tissue disorders: Secondary | ICD-10-CM | POA: Diagnosis not present

## 2024-01-28 DIAGNOSIS — M47816 Spondylosis without myelopathy or radiculopathy, lumbar region: Secondary | ICD-10-CM | POA: Diagnosis not present

## 2024-01-28 DIAGNOSIS — M1712 Unilateral primary osteoarthritis, left knee: Secondary | ICD-10-CM | POA: Diagnosis not present

## 2024-01-28 DIAGNOSIS — Z9079 Acquired absence of other genital organ(s): Secondary | ICD-10-CM | POA: Diagnosis not present

## 2024-01-28 DIAGNOSIS — R600 Localized edema: Secondary | ICD-10-CM | POA: Diagnosis not present

## 2024-01-28 MED ADMIN — iohexol (OMNIPAQUE) 350 mg iodine/mL solution 150 mL: 150 mL | INTRAVENOUS | @ 19:00:00 | Stop: 2024-01-28

## 2024-02-01 DIAGNOSIS — J4550 Severe persistent asthma, uncomplicated: Principal | ICD-10-CM

## 2024-02-03 NOTE — Progress Notes (Signed)
 [facility] Refill Coordination Note    Specialty Medication(s) to be Shipped:   CF/Pulmonary/Asthma: Brinsupri  Dupixent   Pulmozyme  2.5mg /2.46ml  Tobramycin  300mg /34mL, Pari nebulizer cup    Other medication(s) to be shipped: Sodium Chloride     Specialty Medications not needed at this time: CF/Pulmonary/Asthma: Colymycin     Jordan [name], DOB: 1955-11-11  Phone: [phone] (home)       All above HIPAA information was verified with patient.     Was a nurse, learning disability used for this call? No    Completed refill call assessment today to schedule patient's medication shipment from the [facility]  [phone]).  All relevant notes have been reviewed.     Specialty medication(s) and dose(s) confirmed: Regimen is correct and unchanged.   Changes to medications: [name] reports no changes at this time.  Changes to insurance: No  New side effects reported not previously addressed with a pharmacist or physician: None reported  Questions for the pharmacist: No    Confirmed patient received a Conservation Officer, Historic Buildings and a Surveyor, Mining with first shipment. The patient will receive a drug information handout for each medication shipped and additional FDA Medication Guides as required.       DISEASE/MEDICATION-SPECIFIC INFORMATION        For patients on injectable medications: Next injection is scheduled for 02/12/24.    Tobramycin  starts 02/21/24  SPECIALTY MEDICATION ADHERENCE     Medication Adherence    Patient reported X missed doses in the last month: 0  Specialty Medication: Brinsupri 25mg   Patient is on additional specialty medications: Yes  Additional Specialty Medications: Pulmozyme  1 mg/mL  Patient Reported Additional Medication X Missed Doses in the Last Month: 0  Patient is on more than two specialty medications: Yes  Specialty Medication: Dupixent  200mg /1.45mL  Patient Reported Additional Medication X Missed Doses in the Last Month: 0  Specialty Medication: Tobramycin  300mg /44mL  Patient Reported Additional Medication X Missed Doses in the Last Month: 0  Informant: patient  Support network for adherence: family member     Were doses missed due to medication being on hold? No    Brinsupri 25 mg: 7 days of medicine on hand   Pulmozyme  1 mg/ml: 7 days of medicine on hand   Dupixent  200mg /1.77mL: 0 doses of medicine on hand   Tobramycin  300mg /93mL: 0 doses of medicine on hand       REFERRAL TO PHARMACIST     Referral to the pharmacist: Not needed      SHIPPING     Shipping address confirmed in Epic.     Cost and Payment: Patient has a copay of $10. They are aware and have authorized the pharmacy to charge the credit card on file.    Delivery Scheduled: Yes, Expected medication delivery date: 02/08/24.     Medication will be delivered via UPS to the prescription address in Epic [place].    Jordan [name]   [facility]  Specialty Technician

## 2024-02-06 DIAGNOSIS — J479 Bronchiectasis, uncomplicated: Principal | ICD-10-CM

## 2024-02-06 MED ORDER — AZITHROMYCIN 250 MG TABLET
ORAL_TABLET | Freq: Every day | ORAL | 3 refills | 0.00000 days
Start: 2024-02-06 — End: ?

## 2024-02-07 MED ORDER — AZITHROMYCIN 250 MG TABLET
ORAL_TABLET | Freq: Every day | ORAL | 3 refills | 90.00000 days | Status: CP
Start: 2024-02-07 — End: ?

## 2024-02-07 MED FILL — PULMOZYME 1 MG/ML SOLUTION FOR INHALATION: RESPIRATORY_TRACT | 30 days supply | Qty: 75 | Fill #9

## 2024-02-07 MED FILL — SODIUM CHLORIDE 10 % FOR NEBULIZATION: RESPIRATORY_TRACT | 30 days supply | Qty: 900 | Fill #3

## 2024-02-07 MED FILL — BRINSUPRI 25 MG TABLET: ORAL | 30 days supply | Qty: 30 | Fill #2

## 2024-02-07 MED FILL — DUPIXENT 200 MG/1.14 ML SUBCUTANEOUS SYRINGE: SUBCUTANEOUS | 28 days supply | Qty: 2.28 | Fill #5

## 2024-02-07 MED FILL — TOBRAMYCIN 300 MG/5 ML IN 0.225 % SODIUM CHLORIDE FOR NEBULIZATION: RESPIRATORY_TRACT | 56 days supply | Qty: 280 | Fill #1

## 2024-02-07 NOTE — Telephone Encounter (Signed)
 Received a call from patient wanting to know what diuresis was all about. Explained to him how diuresis clinic worked. He also wanted to know about the results of the CT. I read him what Dr. Sedrick had written in the message regarding the results. He did change his time for appointment to 9:00 am just in case he needed 2 doses of IV lasix . He stated his left left was still swollen and his right leg was swollen a little bit. He also stated he was doing better on the 2 doses of Lasix  he was taking but now he is taking 1 dose of lasix .

## 2024-02-07 NOTE — Telephone Encounter (Signed)
 Received a call back asking about medication for diuresis and eating before coming. Explained to him he could eat like normal and as far as medication take everything but the Lasix . Told him Damien could decide if she wants him to take it after he leaves or not. Patient verbalized understanding.

## 2024-02-10 ENCOUNTER — Ambulatory Visit
Admit: 2024-02-10 | Discharge: 2024-02-11 | Payer: Medicare (Managed Care) | Attending: Adult Health | Primary: Adult Health

## 2024-02-10 DIAGNOSIS — I5033 Acute on chronic diastolic (congestive) heart failure: Secondary | ICD-10-CM | POA: Diagnosis not present

## 2024-02-10 DIAGNOSIS — E8770 Fluid overload, unspecified: Secondary | ICD-10-CM | POA: Diagnosis not present

## 2024-02-10 DIAGNOSIS — R0602 Shortness of breath: Secondary | ICD-10-CM | POA: Diagnosis not present

## 2024-02-10 LAB — COMPREHENSIVE METABOLIC PANEL
ALBUMIN: 3.7 g/dL (ref 3.4–5.0)
ALKALINE PHOSPHATASE: 69 U/L (ref 46–116)
ALT (SGPT): 12 U/L (ref 10–49)
ANION GAP: 10 mmol/L (ref 5–14)
AST (SGOT): 17 U/L (ref <=34)
BILIRUBIN TOTAL: 0.3 mg/dL (ref 0.3–1.2)
BLOOD UREA NITROGEN: 19 mg/dL (ref 9–23)
BUN / CREAT RATIO: 18
CALCIUM: 9.5 mg/dL (ref 8.7–10.4)
CHLORIDE: 99 mmol/L (ref 98–107)
CO2: 30.6 mmol/L (ref 20.0–31.0)
CREATININE: 1.08 mg/dL (ref 0.73–1.18)
EGFR CKD-EPI (2021) MALE: 74 mL/min/1.73m2 (ref >=60)
GLUCOSE RANDOM: 89 mg/dL (ref 70–179)
POTASSIUM: 4 mmol/L (ref 3.4–4.8)
PROTEIN TOTAL: 7 g/dL (ref 5.7–8.2)
SODIUM: 140 mmol/L (ref 135–145)

## 2024-02-10 LAB — PRO-BNP: PRO-BNP: 600 pg/mL — ABNORMAL HIGH (ref <=300.0)

## 2024-02-10 LAB — MAGNESIUM: MAGNESIUM: 1.5 mg/dL — ABNORMAL LOW (ref 1.6–2.6)

## 2024-02-10 MED ORDER — FUROSEMIDE 40 MG TABLET
ORAL_TABLET | Freq: Every day | ORAL | 0 refills | 90.00000 days | Status: CP
Start: 2024-02-10 — End: 2024-05-10

## 2024-02-10 MED ORDER — MAGNESIUM OXIDE 400 MG (241.3 MG MAGNESIUM) TABLET
ORAL_TABLET | Freq: Every day | ORAL | 11 refills | 30.00000 days | Status: CP
Start: 2024-02-10 — End: 2025-02-09

## 2024-02-10 MED ADMIN — furosemide (LASIX) injection 40 mg: 40 mg | INTRAVENOUS | @ 15:00:00 | Stop: 2024-02-10

## 2024-02-10 MED ADMIN — magnesium oxide (MAG-OX) tablet 400 mg: 400 mg | ORAL | @ 15:00:00 | Stop: 2024-02-10

## 2024-02-10 MED ADMIN — furosemide (LASIX) injection 40 mg: 40 mg | INTRAVENOUS | @ 17:00:00 | Stop: 2024-02-10

## 2024-02-10 NOTE — Progress Notes (Signed)
 Jordan [name]    Primary Cardiologist/Cardiology Provider(s):  Brock Odell DASEN, MD and Pistiolis, Annalee HERO, MD  PCP:  Frederik Lamar POUR, MD    Last Cardiology Clinic Visit:  02/07/2024  Last IV Diuresis Visit: First visit    Reason for Visit:  Jordan [name] is a 68 y.o. male with a history of extensive pulmonary disease, chronic hypoxic respiratory failure, and worsening lower extremity edema with a history of elevated pro-BNP, who is being seen today in the [facility] IV Diuresis Clinic for an urgent visit for volume status optimization including IV diuresis.      Assessment/Plan:  Acute on Chronic HFpEF  - Volume status today is increased.  - IV Lasix  80 mg was administered during clinic with good response.  - Labs were obtained: Cr 1.08, K 4.0, Pro-BNP 600, Mag 1.5.  - Continue Lasix  40mg  daily  - start mag ox 400mg  daily  - can consider adding HFpEF therapy, spiro and/or [id number]    Response to IV diuresis:         Medications Heart rate Blood pressure Weight (lbs)   Pre  76 138/76 186 lbs   Hour 0 IV Lasix  40 mg      Hour 2 IV Lasix  40 mg 74 135/74    Post  82 133/72   181 lbs       Output:  I/O     I/O         11/18 0701  11/19 0700 11/19 0701  11/20 0700 11/20 0701  11/21 0700    Urine (mL/kg/hr)   1570    Total Output(mL/kg)   1570 (19)    Net   -1570           Urine Occurrence   3 x               Return to clinic:    Return in about 5 days (around 02/15/2024) for Diuresis Clinic.    Future Appointments   Date Time Provider Department Center   02/11/2024 11:30 AM PFT 3 [facility]   02/11/2024 12:00 PM Blondie Ronal Anette Arlean, MD [facility]SPCLET [facility]   02/15/2024  9:00 AM DIURESIS HF NP Aneth [facility]   02/15/2024 10:25 AM Carter Evern Player, MD [facility]END[facility]   02/28/2024 11:00 AM Sable Potts, MD [facility]   07/24/2024  2:15 PM Pistiolis, Annalee HERO, MD [facility]       History of Present Illness:  Jordan [name] is a 68 y.o. male with a history of HFpEF who presents today for IV diuresis. Patient last seen in cardiology clinic on 01/17/2024. At that visit, had worsening LE edema that he noted to be sensitive to increase in Lasix  dose. Lasix  was increased to 20mg  BID x 5 days.  Reported today that initially he was taking 40 mg Furosemide  once daily 10/27-11/3 and on 11/4 went back to 20 mg Furosemide  daily after primary care had done blood work and noticed change to kidney function and wanted to reduce dose. Noticed weight loss of 4-5lbs during the increased dose and swelling was improved.  Reports shortness of breath has been worse the past month and has needed to wear supplemental oxygen .     Since reducing Lasix  from 40mg  a day to 20mg  a day, his swelling has worsened and he has gained back some weight. His breathing is also worse - he notices he has to wear his  O2 walking just a short distance in his house, whereas he used to be able to go without it.      Past Medical History[1]  Past Surgical History[2]    Current Medications[3]  Allergies[4]    Objective:      Physical Exam  BP 133/72 (BP Site: R Arm, BP Position: Sitting, BP Cuff Size: Medium)  - Pulse 82  - Ht 169.7 cm (5' 6.81)  - Wt 82.5 kg (181 lb 12.8 oz)  - SpO2 95%  - BMI 28.64 kg/m??    Wt Readings from Last 12 Encounters:   02/10/24 82.5 kg (181 lb 12.8 oz)   01/17/24 86.1 kg (189 lb 12.8 oz)   11/19/23 83 kg (183 lb)   07/23/23 80.7 kg (178 lb)   07/23/23 80.7 kg (178 lb)   07/19/23 81.4 kg (179 lb 6.4 oz)   04/20/23 79.3 kg (174 lb 14.4 oz)   02/22/23 78 kg (172 lb)   02/15/23 78.4 kg (172 lb 12.8 oz)   02/08/23 74.4 kg (164 lb)   01/26/23 78.2 kg (172 lb 6.4 oz)   01/26/23 78.5 kg (173 lb)       General:  Patient is chronically ill-appearing in no acute distress   Eyes:  Intact, sclerae anicteric.   Ears, nose, mouth: Benign   Respiratory:   left basilar crackles  There are no wheezes. Remains on 2L [place], no increased work of breathing.    Cardiovascular: JVP not seen above the clavicle with HOB at 90 degrees.  Rate and rhythm are regular.  There is no lifts or heaves.  Normal S1, S2. There is no murmur, gallops or rubs.  Radial and pedal pulses are 2+, bilaterally.   There is 2+ pitting pedal/pretibial edema, Left greater than right, right non pitting.    Gastrointestinal:   Soft, non-tender, with audible bowel sounds. Abdomen nondistended.  Liver is nonpalpable.   Musculoskeletal: No joint swelling   Skin: Warm, well perfused.   Neurologic: Appropriate mood and affect. Alert and oriented to person, place, and time. No gross motor or sensory deficits evident.     Recent Labs:  Office Visit on 02/10/2024   Component Date Value Ref Range Status    Sodium 02/10/2024 140  135 - 145 mmol/L Final    Potassium 02/10/2024 4.0  3.4 - 4.8 mmol/L Final    Chloride 02/10/2024 99  98 - 107 mmol/L Final    CO2 02/10/2024 30.6  20.0 - 31.0 mmol/L Final    Anion Gap 02/10/2024 10  5 - 14 mmol/L Final    BUN 02/10/2024 19  9 - 23 mg/dL Final    Creatinine 88/79/7974 1.08  0.73 - 1.18 mg/dL Final    BUN/Creatinine Ratio 02/10/2024 18   Final    eGFR CKD-EPI (2021) Male 02/10/2024 74  >=60 mL/min/1.69m2 Final    Glucose 02/10/2024 89  70 - 179 mg/dL Final    Calcium  02/10/2024 9.5  8.7 - 10.4 mg/dL Final    Albumin 88/79/7974 3.7  3.4 - 5.0 g/dL Final    Total Protein 02/10/2024 7.0  5.7 - 8.2 g/dL Final    Total Bilirubin 02/10/2024 0.3  0.3 - 1.2 mg/dL Final    AST 88/79/7974 17  <=34 U/L Final    ALT 02/10/2024 12  10 - 49 U/L Final    Alkaline Phosphatase 02/10/2024 69  46 - 116 U/L Final    Magnesium  02/10/2024 1.5 (L)  1.6 - 2.6 mg/dL Final  PRO-BNP 02/10/2024 600.0 (H)  <=300.0 pg/mL Final       Lab Results   Component Value Date    PRO-BNP 600.0 (H) 02/10/2024    PRO-BNP 721.0 (H) 01/26/2023    Creatinine Whole Blood, POC 1.2 04/15/2021    Creatinine Whole Blood, POC 0.4 (L) 06/12/2014    Creatinine Whole Blood, POC 1.4 06/12/2014    Creatinine 1.08 02/10/2024    Creatinine 1.02 08/03/2023    Creatinine 0.98 08/02/2023    Creatinine 1.07 07/23/2023    Creatinine 1.05 01/29/2023    Creatinine 1.50 (H) 08/13/2013    Creatinine 1.37 (H) 08/12/2013    Creatinine 1.38 (H) 08/11/2013    Creatinine 1.47 (H) 08/10/2013    Creatinine 1.47 (H) 08/09/2013    BUN 19 02/10/2024    BUN 34 (H) 08/03/2023    BUN 32 (H) 08/02/2023    BUN 27 (H) 07/23/2023    BUN 23 01/29/2023    BUN 24 (H) 08/13/2013    BUN 24 (H) 08/12/2013    BUN 19 08/11/2013    BUN 22 (H) 08/10/2013    BUN 28 (H) 08/09/2013    Sodium 140 02/10/2024    Sodium 142 08/03/2023    Sodium 144 08/02/2023    Sodium 142 07/23/2023    Sodium 143 01/29/2023    Sodium 135 08/13/2013    Sodium 135 08/12/2013    Sodium 137 08/11/2013    Sodium 134 (L) 08/10/2013    Sodium 132 (L) 08/09/2013    Sodium Whole Blood 137 03/24/2015    Potassium 4.0 02/10/2024    Potassium 4.0 08/03/2023    Potassium 4.1 08/02/2023    Potassium 4.1 07/23/2023    Potassium 3.9 01/29/2023    Potassium 3.8 08/13/2013    Potassium 3.6 08/12/2013    Potassium 4.0 08/11/2013    Potassium 4.2 08/10/2013    Potassium 4.0 08/09/2013    Potassium, Bld 3.9 03/24/2015    Magnesium  1.5 (L) 02/10/2024    Magnesium  1.5 (L) 12/22/2021    Magnesium  1.6 11/21/2021    Magnesium  1.6 11/14/2021    Magnesium  1.9 11/20/2020    Magnesium  2.0 08/13/2013    Magnesium  2.0 08/12/2013    Magnesium  1.7 08/11/2013    Magnesium  1.7 08/10/2013    Magnesium  2.0 08/09/2013       Metric Tracker:  Did today's visit result in ED visit? No  Did today's visit result in hospital admission? No  Did today's visit result in referral to cardiology? Yes  Today's visit was a referral from Self            [1]   Past Medical History:  Diagnosis Date    Abscess of lung    (CMS-HCC) 11/06/2010    CT Chest 10/22/10 12/05/2010 right thoracotomy resection of right middle lobe and resection of right lower lobe abscess     Arthritis     back    Biceps tendon tear 2013    left side    Bronchiectasis (CMS-HCC)     chronic psuedomonas infection    Degenerative joint disease of left acromioclavicular joint     GERD (gastroesophageal reflux disease)     Hypertension     Pneumonia 2012    Lung abcess     PONV (postoperative nausea and vomiting)     Prostate cancer    (CMS-HCC) 09/09/2017    Rotator cuff injury left    Vertigo    [2]   Past Surgical History:  Procedure  Laterality Date    APPENDECTOMY  1969    BACK SURGERY      BRONCHOSCOPY  10/19/2010    [facility]    CARPAL TUNNEL RELEASE  1999    IR EMBOLIZATION HEMORRHAGE ART OR VEN  LYMPHATIC EXTRAVASATION  07/11/2021    IR EMBOLIZATION HEMORRHAGE ART OR VEN  LYMPHATIC EXTRAVASATION 07/11/2021 Ivonne Butler Seed, MD IMG VIR [facility]    LUNG REMOVAL, PARTIAL Right 10/2010    RML and RLL partially resected    MENISCECTOMY  2011    NECK SURGERY  1993, 1999, 2002    PR COLSC FLX W/RMVL OF TUMOR POLYP LESION SNARE TQ Left 02/08/2023    Procedure: COLONOSCOPY FLEX; W/REMOV TUMOR/LES BY SNARE;  Surgeon: Rhoderick Burnard Norris, MD;  Location: GI [facility];  Service: Gastroenterology    PR GERD TST W/ MUCOS IMPEDE ELECTROD,>1HR N/A 07/06/2012    Procedure: ESOPHAGEAL FUNCTION TEST, GASTROESOPHAGEAL REFLUX TEST W/ NASAL CATHETER INTRALUMINAL IMPEDANCE ELECTRODE(S) PLACEMENT, RECORDING, ANALYSIS AND INTERPRETATION; PROLONGED;  Surgeon: None None;  [facility]: [facility];  Service: Gastroenterology    PR OPEN TREAT RIB FRACTURE W/INT FIXATION, UNILATERAL, 1-2 RIBS Right 03/19/2015    Procedure: OPEN TREATMENT OF RIB FRACTURE REQUIRING INTERNAL FIXATION, UNILATERAL; 1-2 RIBS;  Surgeon: Morene Nadara Diamond, MD;  Location: MAIN OR [facility];  Service: Cardiothoracic    PR THORACOTOMY W/THERAP WEDGE RESEXN ADDL IPSILATRL Right 08/07/2013    Procedure: THORACOTOMY; WITH THERAPEUTIC LOBECTOMY OF RIGHT MIDDLE AND RIGHT LOWER LOBE RESECTION , EACH ADDITIONAL RESECTION, IPSILATERAL;  Surgeon: Morene FORBES Diamond, MD;  Location: MAIN OR [place];  Service: Cardiothoracic TONSILLECTOMY  1965   [3]   Current Outpatient Medications   Medication Sig Dispense Refill    albuterol  2.5 mg /3 mL (0.083 %) nebulizer solution INHALE 3 ML BY NEBULIZATION THREE (3) TIMES A DAY. 825 mL 3    albuterol  HFA 90 mcg/actuation inhaler Inhale 2 puffs four (4) times a day as needed. 18 g 11    alcohol  swabs  (ALCOHOL  PREP PADS) PadM Use as directed with inhaled antibiotics 100 each 5    ascorbic acid , vitamin C , (VITAMIN C ) 1000 MG tablet Take 1 tablet (1,000 mg total) by mouth daily.      atorvastatin  (LIPITOR ) 10 MG tablet Take 1 tablet (10 mg total) by mouth daily.      azithromycin  (ZITHROMAX ) 250 MG tablet TAKE 1 TABLET BY MOUTH EVERY DAY 90 tablet 3    brensocatib 25 mg tablet Take 1 tablet (25 mg total) by mouth daily. 30 tablet 11    cholecalciferol , vitamin D3-50 mcg, 2,000 unit,, 50 mcg (2,000 unit) tablet Take 1 tablet (50 mcg total) by mouth daily.      colistimethate  (COLYMYCIN) 150 mg injection Inject 2 mL sterile water  for injection to mix colistin vial, then draw up 2 mL (150mg ) and inhale 2 times a day, 28 days on and 28 days off. 60 each 5    dilTIAZem  (CARDIZEM  CD) 120 MG 24 hr capsule TAKE 1 CAPSULE BY MOUTH EVERY DAY 90 capsule 3    dornase alfa  (PULMOZYME ) 1 mg/mL nebulizer solution Inhale 1 ampule (2.5 mg) via nebulizer daily. Use at least 30-60 minutes before airway clearance, or after airway clearance. 75 mL 11    dupilumab  (DUPIXENT  SYRINGE) 200 mg/1.14 mL syringe Inject the contents of 1 syringe (200 mg total) under the skin every fourteen (14) days. 2.3 mL 11    empty container (SHARPS CONTAINER) Misc USE AS DIRECTED 1 each  0    empty container Misc USE AS DIRECTED 1 each 2    empty container Misc Use as directed to dispose of needles. When full, make sure lid is closed tightly then dispose of container in trash. 1 each 2    ensifentrine  (OHTUVAYRE ) 3 mg/2.5 mL NbSp Inhale 3 mg two (2) times a day. 75 mL 11    EPINEPHrine  (EPIPEN ) 0.3 mg/0.3 mL injection Inject 0.3 mL (0.3 mg total) into the muscle once as needed for anaphylaxis (may repeat if needed) for up to 2 doses. 2 each 0    ferrous sulfate  325 (65 FE) MG tablet Take 1 tablet (325 mg total) by mouth Two (2) times a day. BID      fluticasone  propionate (FLONASE ) 50 mcg/actuation nasal spray 1 spray into each nostril daily.      fluticasone -umeclidin-vilanter (TRELEGY ELLIPTA ) 100-62.5-25 mcg inhaler INHALE 1 PUFF DAILY 90 each 3    HYDROcodone -acetaminophen  (NORCO ) 7.5-325 mg per tablet Take 1 tablet by mouth three (3) times a day (at 6am, noon and 6pm).      HYDROcodone -chlorpheniramine  polistirex (TUSSIONEX PENNKINETIC ) 10-8 mg/5 mL ER suspension Take 5 mL by mouth every twelve (12) hours as needed for cough. 115 mL 0    montelukast  (SINGULAIR ) 10 mg tablet TAKE 1 TABLET BY MOUTH EVERY DAY AT NIGHT 90 tablet 3    multivitamin-minerals-lutein Tab Take 1 tablet by mouth daily.      nebulizers (LC PLUS) Misc Use with inhaled medications 1 each 5    nebulizers (LC PLUS) Misc Use as directed 4 each 3    nebulizers Misc use with nebulized medications 12 each 0    needle, disp, 21 G (BD REGULAR BEVEL NEEDLES) 21 gauge x 1 1/2 Ndle Use as directed with inhaled Colistin 100 each 3    NON FORMULARY Take 650 mg by mouth two (2) times a day. Brand: Citracal 650mg       omeprazole (PRILOSEC) 20 MG capsule Take 1 capsule (20 mg total) by mouth Two (2) times a day.      OXYGEN -AIR DELIVERY SYSTEMS MISC Inhale 2 mL.      predniSONE  (DELTASONE ) 10 MG tablet Take 40mg  (4 tabs) daily x5 day, then 30mg  (3 tabs) daily x5 day, then 20mg  (2 tabs) daily x 5 day, then 10mg  (1 tab) daily x5 day, then off 50 tablet 0    sodium chloride  (BD POSIFLUSH NORMAL SALINE 0.9) 0.9 % injection Inject 2mL of 0.9%NaCl into colistin vial & gently mix. After withdrawing colistin dose, add an additional 1mL of 0.9%NaCl to neb cup with the colistin dose. 180 mL 11    sodium chloride  10 % Nebu Measure and Inhale 5 mL by nebulization Two (2) times a day. Discard remaining amount and use a new vial for each dose. 900 mL 3    sterile water  Soln Use 2mL to mix Colistin, then add additional 2mL to neb cup with 2mL of mixed colistin. Inhale twice daily 28 days on and 28 days off. 600 mL 5    syringe with needle (BD LUER-LOK SYRINGE) 3 mL 20 gauge x 1 1/2 Syrg For use with inhaled antibiotic (Colistin) 60 each 30    syringe with needle (BD LUER-LOK SYRINGE) 3 mL 21 gauge x 1 Syrg For use with inhaled antibiotic (Colistin) 60 each 5    tobramycin , PF, ([name] ) 300 mg/5 mL nebulizer solution Inhale the contents of 1 ampule (300 mg total) by nebulization every twelve (12) hours. 28 days on  and 28 days off. 280 mL 5    zinc  gluconate 50 mg (7 mg elemental zinc ) tablet Take 1 tablet (50 mg total) by mouth.      zoledronic  acid-mannitol &water  (RECLAST ) 5 mg/100 mL PgBk Infusion      furosemide  (LASIX ) 40 MG tablet Take 1 tablet (40 mg total) by mouth daily. 90 tablet 0    magnesium  oxide (MAG-OX) 400 mg (241.3 mg elemental magnesium ) tablet Take 1 tablet (400 mg total) by mouth daily. 30 tablet 11     No current facility-administered medications for this visit.   [4]   Allergies  Allergen Reactions    Gentamicin Other (See Comments)     BALANCE ISSUES    Tobramycin  Other (See Comments)     ototoxicity    Mirtazapine  Other (See Comments)     Lower extremity swelling    Morphine Nausea And Vomiting

## 2024-02-10 NOTE — Patient Instructions (Signed)
 IV Diuresis Clinic Discharge Instructions:    MEDICATIONS:  We are changing your medications today.  - Take Lasix  40mg  daily until follow up next week  - Start magnesium  oxide 400mg  daily  Call if you have questions about your medications.    IMPORTANT INSTRUCTIONS:  - Weigh yourself daily in the morning; write your weight down and bring it with you next time   - Limit your fluid intake to 2 Liters (half-gallon) per day    - Limit your salt intake to 2-3 grams (2000-3000 mg) per day  - If you have a morning diuresis appointment, don't take your morning diuretic  - If you have an afternoon diuresis appointment, take your morning diuretic    LABS:  Your labs are stable    NEXT APPOINTMENT:  Return to clinic in 5 days with IV Diuresis      My office number (c/o Elenor Mulch RN) is [phone] if you need further assistance, or need to schedule with our IV Diuresis clinic in the future.    If you need to reschedule future appointments, please call [phone].   After office hours, if you have urgent questions/problems, contact the on-call cardiologist through the hospital operator: [phone].    Please do not send a MyChart message for potentially life-threatening symptoms or time-sensitive issues. MyChart messages are not monitored after normal business hours or on weekends. Please call 911 for a true medical emergency.

## 2024-02-11 ENCOUNTER — Inpatient Hospital Stay: Admit: 2024-02-11 | Discharge: 2024-02-11 | Payer: Medicare (Managed Care)

## 2024-02-11 ENCOUNTER — Ambulatory Visit
Admit: 2024-02-11 | Discharge: 2024-02-11 | Payer: Medicare (Managed Care) | Attending: Internal Medicine | Primary: Internal Medicine

## 2024-02-11 DIAGNOSIS — J479 Bronchiectasis, uncomplicated: Principal | ICD-10-CM

## 2024-02-11 DIAGNOSIS — J471 Bronchiectasis with (acute) exacerbation: Secondary | ICD-10-CM | POA: Diagnosis not present

## 2024-02-11 MED ORDER — CIPROFLOXACIN 750 MG TABLET
ORAL_TABLET | Freq: Two times a day (BID) | ORAL | 0 refills | 14.00000 days | Status: CP
Start: 2024-02-11 — End: ?

## 2024-02-11 MED ORDER — AMOXICILLIN 875 MG-POTASSIUM CLAVULANATE 125 MG TABLET
ORAL_TABLET | Freq: Two times a day (BID) | ORAL | 0 refills | 14.00000 days | Status: CP
Start: 2024-02-11 — End: 2024-02-25

## 2024-02-11 NOTE — Patient Instructions (Signed)
 Symptoms of a bronchiectasis exacerbation: 48 hours of at least two of the following:  Increased cough  Change in volume or appearance of sputum  Increased sputum purulence  Worsening shortness of breath and/or exercise tolerance  Fatigue and/or malaise  Coughing up blood (hemoptysis)    If you are experiencing some of these symptoms:  Increase the frequency and/or intensity of your airway clearance  Try to submit a sputum sample for bacterial and AFB cultures (at [facility] or locally)  Reach out to me or your local physician.  If you need antibiotics, recommend treating for 14 days.    Please bring any new airway clearance equipment to your next visit to ensure that you are using and caring for it properly.    Thank you for allowing me to be a part of your care. Please call the clinic with any questions.    Thank you for your visit to the [facility] Pulmonary Clinics. You may receive a survey from [facility] regarding your visit today, and we are eager to use this feedback to improve your experience. Thank you for taking the time to fill it out.    Between appointments, you can reach us  at these numbers:  For appointments: [phone]  For my nurse, Darice Lever: [phone]  Fax: [phone]  For urgent issues after hours: Hospital Operator @ [phone] & ask for Pulmonary Fellow on call  For Respiratory Therapy/Airway clearance needs or questions, call respiratory therapist Jackolyn Springer [phone]    My Central Chart is for non-urgent messages. This means you have a simple medical question that does not require an immediate response.     If you need immediate attention, call 911.     Responses may take up to 3 business days. Your message will be read by your provider or another medical team member who may respond on your provider???s behalf.    Some questions cannot be answered through messages in My [facility] Chart. Depending on your question, your provider???s office may ask you to schedule an appointment. Information sent through My [facility] Chart will become part of your medical record.    For further information, check out the websites below:    General Information:  [facility] Bronchiectasis/NTM Care and Research Center: [email or web address].[email or web address]  Information about bronchiectasis (BE): Speak Up In BE - Page for People With Bronchiectasis (speakupinbronchiectasis.com)   Information about non-tuberculous Mycobacterial Infections (NTM):  https://www.ntminfo.org   Videos from the patient session of the World Bronchiectasis Conference in [place]  [place] on October 03, 2016 can be watched here (Trustynews.es).   Copy for Individuals with Bronchiectasis and/or NTM through the Bronchiectasis and NTM Association: classpreviews.com.br   [employer] NTM Patient Education Program: https://nyulangone.org/care-services/bronchiectasis-ntm-program/ntm-patient-education-program  [employer] Bronchiectasis and NTM Patient Education Program on Mar 02, 2023: https://players.blogselections.co.uk    Airway Clearance, Exercise, and Care:  Impact Airway Clearance Education: http://www.impact-be.com  Bronchiectasis Toolbox (information about airway clearance): diningcalendar.de  Exercise program by [employer]: http://www.brown.com/  Be Clear with Bronchiectasis by Rock Redo, MPH (health educator and patient): https://www.letsbecleartoday.com/    Research Opportunities:  Interested in our bronchiectasis and NTM clinical trials: https://go.Toyarticles.ca      Interested in other clinical trials and research opportunities?  www.clinicaltrials.gov  The Rare Diseases Clinical Research Network [facility]) Genetic Disorders of Mucociliary Clearance Consortium [facility]) Contact Registry is a way for patients with disorders of mucociliary clearance (such as bronchiectasis) and their family members to learn about research studies they may be able to join. Participation is  completely voluntary and you may choose to withdraw at any time. There is no cost to join the [employer]. For more information or to join the registry please go to the following website:  [email or web address]

## 2024-02-11 NOTE — Progress Notes (Signed)
 Patient will no longer be able to get parts and accessories for his Life 2000 after the end of the year.  Gave him a large bore nasal cannula and oxymizer to see if he finds either of those helpful with exertion.  If one of those will work, I will see if I can find a place to order them.

## 2024-02-11 NOTE — Progress Notes (Signed)
 [facility]    Assessment & Plan:   Patient:Jordan [name] (01-30-1956)  Reason for visit: [name] is a 68 y.o.male who returns for follow-up of bronchiectasis secondary to unknown cause (idiopathic) with Pseudomonas colonization, chronic hypoxemic respiratory failure, and asthma/COPD overlap. Had clinical improvement with Brinsupri but now reporting 2 weeks of increased sputum.  Suspect developing bronchiectasis exacerbation.    Bronchiectasis Severity Index Score: Calculated on 11/20/23.    Age 78-69 = 2   BMI 18.5 or higher = 0   FEV1 % predicted 30-49% = 2   Hospitalized for severe exacerbation in past 2 years Yes = 5   Exacerbations in previous year 3 or more = 2   mMRC dyspnea score 4 = I am too breathless to leave the house or I am breathless when dressing.   Chronic Pseudomonas colonization (at least 2 positive cultures at least 3 months apart within 1 year) Yes = 3   Colonization with other potential pathogenic bacteria (at least 2 positive cultures at least 3 months apart within 1 year) * No = 0   Radiologic Severity 3 or more lobes or cystic brx = 1       Total Score 19     BSI score of 9+ = Severe bronchiectasis (1 year outcomes 7.6 - 10.5% mortality, 16.7 - 52.6% hospitalization rate; 4 year outcomes 9.9 - 29.2% mortality, 41.2 - 80.4% hospitalization)     * NTM is not included.    Jordan [name] et al. The Bronchiectasis Severity Index: An International Derivation and Validation Study. AJRCCM, 2013; 189(5): [phone].)  Assessment & Plan  Idiopathic bronchiectasis with chronic Pseudomonas colonization  Increased mucus production with globby consistency. Potential infection due to increased mucus and shortness of breath.  - Continue azithromycin  if no contraindications.  - Prescribed Augmentin  and Cipro  for potential infection.  - If fails to improve, will need IV antibiotics.  Want to get treated and recovered before Christmas.  - Continue Brinsupri and Ohtuvayre .    Chronic hypoxemic respiratory failure with asthma-COPD overlap syndrome  Shortness of breath worsened with exertion, oxygen  saturation drops to 80-84% on 2-3 liters. Potential infection considered. Improvement in wheezing and chest tightness with inhalers.  - Continue current inhaler regimen including Trelegy and rescue albuterol .  - Monitor oxygen  saturation and adjust flow rate as needed.  - Provided large bore nasal cannulas and oxymizers for improved oxygen  delivery.    Heart failure with preserved ejection fraction and volume overload  Recent diuresis with IV Lasix  removed 7-8 pounds of fluid. Persistent left lower extremity edema. Discussed potential medication changes with cardiologist. Increased Lasix  to 40 mg due to fluid retention, with previous kidney function issues at this dose.  - Continue current Lasix  regimen and monitor fluid status.  - Discuss potential medication changes with cardiologist.  - Monitor weight daily and report to diuresis clinic.    Chronic kidney disease, unspecified stage  Concerns about kidney function with increased Lasix  dosage. Previous decline in kidney function noted at 40 mg Lasix .  - Monitor kidney function closely with increased Lasix  dosage.  - Continue to monitor creatinine levels and adjust Lasix  as needed.    Hypomagnesemia  Magnesium  levels previously low, currently on 400 mg supplementation.  - Continue magnesium  supplementation.    Jordan [name] will return to clinic in 2-3 months for follow-up with pre-bronchodilator spirometry and sputum cultures.  He will call or send me a message via MyChart if questions or concerns arise before  this visit. Jordan [name] is in agreement with the above plan.      Subjective:     History of Present Illness  Jordan [name] is a 68 year old male with idiopathic bronchiectasis, chronic hypoxemic respiratory failure, and asthma-COPD overlap who presents for a follow-up visit.    Dyspnea and hypoxemia  - Increased shortness of breath, especially with ambulation around the house  - Oxygen  saturation drops to 80-84% on 2-3 L of oxygen  when walking, requiring brief rest periods for recovery  - Uses a portable oxygen  concentrator when outside the house    Bronchial secretions and cough  - Had improvement in mucus with addition of Brinsupri but mucus has become more 'globby' over the past two weeks  - No change in amount or color of sputum  - Fewer coughing spells than usual    Wheezing and chest tightness  - On Brinsupri, Ohtuvayre , and Trelegy with some improvement in wheezing and chest tightness since initiation  - Uses rescue albuterol  approximately once per week    Fluid retention and edema  - Persistent fluid retention, particularly in the legs, despite recent diuresis treatments  - Seen in diuresis clinic yesterday for IV Lasix , resulting in a 7-8 pound fluid loss  - Lasix  dosage increased from 20 mg to 40 mg  - Does not add salt to food but consumes prepackaged foods that may contain hidden salt    Psychosocial stressors and travel  - Experiencing emotional distress due to the recent loss of a friend who was attacked by dogs, with increased difficulty around the holidays  - Plans to travel to [employer] for Thanksgiving and is managing health concerns related to cold weather    Respiratory Symptoms:   Cough: productive of yellow brown globby sputum.    Nocturnal awakenings: present.  Wheezing: improved.  Chest tightness: improved.  Rescue albuterol  use: 1x/wk.  Pleurisy: absent.  Hemoptysis: absent.  MMRC: 4 = I am too breathless to leave the house or I am breathless when dressing.    Exacerbations:   Number of exacerbations treated in the past year: 4  Dates of exacerbations: 03/2018, 07/2018, 02/2019, 09/2019, 12/2019, 10/2020, 01/2021, 07/2021, 03/2022, 04/2022, 07/2022, 10/2022, 01/2023, 02/2023, 07/2023, 08/2023, 10/2023   Number of hospitalizations for exacerbations in the past 2 years: 2  Dates of hospitalizations:  01/2015, 03/2015, 01/2016, 03/2017, 09/2019, 10/2020, 02/2021, 10/2022, 01/2023 (rhino/enterovirus), 07/2023     Pulmonary Therapies:   Airway clearance: 2-4 times per day  Nebulized medications: Pulmozyme  daily, HTS 10%   Mechanical: Percussive vest  Chronic Antibiotics:  Inhaled: Alternates [name]  and colistin. On colistin.  Oral: No  Anti-Inflammatory:  Chronic Macrolide: 250 mg daily  Chronic Prednisone : No  DPP-1 Inhibitor: Brinsupri since 12/13/23  Inhalers/Nebulizers: Trelegy 100, Ohtuvayre   Biologics: Dupixent  started 08/2023  Other Therapies:  Exercise: Not formally.  Pulmonary Rehab: Telehealth pulmonary rehab July 2022. Referred to [facility] at last visit but haven't been able to reach him. Not discussed today.  Supplemental oxygen : Baseline of 2L continuous at rest and with sleep. Increases to 4-5L with activity and uses Life2000  NIPPV: No     Review of Systems  Remainder of a complete review of systems was negative unless mentioned above.    Past Medical History:   Diagnosis Date    Abscess of lung    (CMS-HCC) 11/06/2010    CT Chest 10/22/10 12/05/2010 right thoracotomy resection of right middle lobe and resection of right lower lobe abscess  Arthritis     back    Biceps tendon tear 2013    left side    Bronchiectasis (CMS-HCC)     chronic psuedomonas infection    Degenerative joint disease of left acromioclavicular joint     GERD (gastroesophageal reflux disease)     Hypertension     Pneumonia 2012    Lung abcess     PONV (postoperative nausea and vomiting)     Prostate cancer    (CMS-HCC) 09/09/2017    Rotator cuff injury left    Vertigo        Current Outpatient Medications   Medication Sig Dispense Refill    albuterol  2.5 mg /3 mL (0.083 %) nebulizer solution INHALE 3 ML BY NEBULIZATION THREE (3) TIMES A DAY. 825 mL 3    albuterol  HFA 90 mcg/actuation inhaler Inhale 2 puffs four (4) times a day as needed. 18 g 11    alcohol  swabs  (ALCOHOL  PREP PADS) PadM Use as directed with inhaled antibiotics 100 each 5    ascorbic acid , vitamin C , (VITAMIN C ) 1000 MG tablet Take 1 tablet (1,000 mg total) by mouth daily.      atorvastatin  (LIPITOR ) 10 MG tablet Take 1 tablet (10 mg total) by mouth daily.      azithromycin  (ZITHROMAX ) 250 MG tablet TAKE 1 TABLET BY MOUTH EVERY DAY 90 tablet 3    brensocatib 25 mg tablet Take 1 tablet (25 mg total) by mouth daily. 30 tablet 11    cholecalciferol , vitamin D3-50 mcg, 2,000 unit,, 50 mcg (2,000 unit) tablet Take 1 tablet (50 mcg total) by mouth daily.      colistimethate  (COLYMYCIN) 150 mg injection Inject 2 mL sterile water  for injection to mix colistin vial, then draw up 2 mL (150mg ) and inhale 2 times a day, 28 days on and 28 days off. 60 each 5    dilTIAZem  (CARDIZEM  CD) 120 MG 24 hr capsule TAKE 1 CAPSULE BY MOUTH EVERY DAY 90 capsule 3    dornase alfa  (PULMOZYME ) 1 mg/mL nebulizer solution Inhale 1 ampule (2.5 mg) via nebulizer daily. Use at least 30-60 minutes before airway clearance, or after airway clearance. 75 mL 11    dupilumab  (DUPIXENT  SYRINGE) 200 mg/1.14 mL syringe Inject the contents of 1 syringe (200 mg total) under the skin every fourteen (14) days. 2.3 mL 11    empty container (SHARPS CONTAINER) Misc USE AS DIRECTED 1 each 0    empty container Misc USE AS DIRECTED 1 each 2    empty container Misc Use as directed to dispose of needles. When full, make sure lid is closed tightly then dispose of container in trash. 1 each 2    ensifentrine  (OHTUVAYRE ) 3 mg/2.5 mL NbSp Inhale 3 mg two (2) times a day. 75 mL 11    EPINEPHrine  (EPIPEN ) 0.3 mg/0.3 mL injection Inject 0.3 mL (0.3 mg total) into the muscle once as needed for anaphylaxis (may repeat if needed) for up to 2 doses. 2 each 0    ferrous sulfate  325 (65 FE) MG tablet Take 1 tablet (325 mg total) by mouth Two (2) times a day. BID      fluticasone  propionate (FLONASE ) 50 mcg/actuation nasal spray 1 spray into each nostril daily.      fluticasone -umeclidin-vilanter (TRELEGY ELLIPTA ) 100-62.5-25 mcg inhaler INHALE 1 PUFF DAILY 90 each 3    furosemide  (LASIX ) 40 MG tablet Take 1 tablet (40 mg total) by mouth daily. 90 tablet 0    HYDROcodone -acetaminophen  (NORCO ) 7.5-325  mg per tablet Take 1 tablet by mouth three (3) times a day (at 6am, noon and 6pm).      HYDROcodone -chlorpheniramine  polistirex (TUSSIONEX PENNKINETIC ) 10-8 mg/5 mL ER suspension Take 5 mL by mouth every twelve (12) hours as needed for cough. 115 mL 0    magnesium  oxide (MAG-OX) 400 mg (241.3 mg elemental magnesium ) tablet Take 1 tablet (400 mg total) by mouth daily. 30 tablet 11    montelukast  (SINGULAIR ) 10 mg tablet TAKE 1 TABLET BY MOUTH EVERY DAY AT NIGHT 90 tablet 3    multivitamin-minerals-lutein Tab Take 1 tablet by mouth daily.      nebulizers (LC PLUS) Misc Use with inhaled medications 1 each 5    nebulizers (LC PLUS) Misc Use as directed 4 each 3    nebulizers Misc use with nebulized medications 12 each 0    needle, disp, 21 G (BD REGULAR BEVEL NEEDLES) 21 gauge x 1 1/2 Ndle Use as directed with inhaled Colistin 100 each 3    NON FORMULARY Take 650 mg by mouth two (2) times a day. Brand: Citracal 650mg       omeprazole (PRILOSEC) 20 MG capsule Take 1 capsule (20 mg total) by mouth Two (2) times a day.      OXYGEN -AIR DELIVERY SYSTEMS MISC Inhale 2 mL.      predniSONE  (DELTASONE ) 10 MG tablet Take 40mg  (4 tabs) daily x5 day, then 30mg  (3 tabs) daily x5 day, then 20mg  (2 tabs) daily x 5 day, then 10mg  (1 tab) daily x5 day, then off 50 tablet 0    sodium chloride  (BD POSIFLUSH NORMAL SALINE 0.9) 0.9 % injection Inject 2mL of 0.9%NaCl into colistin vial & gently mix. After withdrawing colistin dose, add an additional 1mL of 0.9%NaCl to neb cup with the colistin dose. 180 mL 11    sodium chloride  10 % Nebu Measure and Inhale 5 mL by nebulization Two (2) times a day. Discard remaining amount and use a new vial for each dose. 900 mL 3    sterile water  Soln Use 2mL to mix Colistin, then add additional 2mL to neb cup with 2mL of mixed colistin. Inhale twice daily 28 days on and 28 days off. 600 mL 5 syringe with needle (BD LUER-LOK SYRINGE) 3 mL 20 gauge x 1 1/2 Syrg For use with inhaled antibiotic (Colistin) 60 each 30    syringe with needle (BD LUER-LOK SYRINGE) 3 mL 21 gauge x 1 Syrg For use with inhaled antibiotic (Colistin) 60 each 5    tobramycin , PF, ([name] ) 300 mg/5 mL nebulizer solution Inhale the contents of 1 ampule (300 mg total) by nebulization every twelve (12) hours. 28 days on and 28 days off. 280 mL 5    zinc  gluconate 50 mg (7 mg elemental zinc ) tablet Take 1 tablet (50 mg total) by mouth.      zoledronic  acid-mannitol &water  (RECLAST ) 5 mg/100 mL PgBk Infusion       No current facility-administered medications for this visit.       Allergies  Reviewed on 02/11/2024        Reactions Comments    Gentamicin Other (See Comments) BALANCE ISSUES    Tobramycin  Other (See Comments) ototoxicity    Mirtazapine  Other (See Comments) Lower extremity swelling    Morphine Nausea And Vomiting             Social History     Tobacco Use    Smoking status: Never    Smokeless tobacco: Never   Vaping  Use    Vaping status: Never Used   Substance Use Topics    Alcohol  use: No     Alcohol /week: 0.0 standard drinks of alcohol     Drug use: No       Objective:   BP 146/80 (BP Position: Sitting)  - Pulse 95  - Temp 36.7 ??C (98 ??F) (Temporal)  - Ht 170.5 cm (5' 7.13)  - Wt 83 kg (183 lb)  - SpO2 94% Comment: pt is on 3 liters of oxygen  - BMI 28.55 kg/m??   Physical Exam  CHEST: Mild wheezing on deep inspiration, minimal wheezing overall.  CARDIOVASCULAR: Heart sounds normal.  EXTREMITIES: Edema extending to the knee. Left > right.    Diagnostic Review:     Pulmonary Function Testing:       FVC (% predicted) FEV1 (% predicted) FEV1/FVC   11/10/2013 3.19 L (68%) 2.16 L (61%)  68%   02/23/2014 3.50 L (76%)  2.23 L (64%)  64%   07/12/2014  2.94 L (66%)  2.02 L (58%)  69%   01/15/2015  2.82 L (62%)  1.79 L (52%)  63%   02/08/2015  3.04 L (66%)  2.06 L (59%)  68%   04/02/2015  2.86 L (63%)  1.83 L (53%)  64%   12/10/2015  2.77 L (62%)  1.62 L (48%)  58%   01/27/2016  2.42 L (54%)  1.55 L (46%)  64%   03/24/2016  3.36 L (75%)  1.99 L (59%)  59%   07/21/2016  2.56 L (57%)  1.54 L (46%)  60%   12/29/2016  2.80 L (63%)  1.57 L (47%)  56%   04/06/2017  2.61 L (59%)  1.46 L (44%)  56%   07/06/2017  2.56 L (58%)  1.49 L (45%)  58%   10/12/2017  2.63 L (59%)  1.47 L (44%)  56%   01/18/2018  2.68 L (63%)  1.36 L (42%)  51%   05/17/2018 2.49 L (57%)  1.25 L (38%)  50%   09/26/2018 2.43 L (57%)  1.26 L (39%)  52%   01/26/2019 2.16 L (51%)  1.16 L (36%)  54%   06/06/2019 2.47 L (59%) 1.11 L (34%) 45%   11/10/19 2.14 L (50%) 1.20 L (37%) 56%   02/13/20 2.01 L (48%) 1.08 L (34%) 54%   05/02/20 2.29 L (54%) 1.11 L (34%) 48%   07/02/20 (pre) 2.11 L (51%) 1.17 L (37%) 56%   07/02/20 (post) 2.26 L (54%) [+7.2%] 1.22 L (38%) [+4.0%] 54%   10/31/20 2.46 L (59%) 1.17 L (37%) 48%   07/01/21 2.33 L (56%) 1.13 L (36%) 48%   09/29/21 1.92 L (47%) 1.01 L (32%) 53%   11/21/21 2.10 L (51%) 1.03 L (33%) 49%   04/14/22 2.09 L (54%) 1.05 L (35%) 50%   08/04/22 1.98 L (51%) 1.00 L (34%) 50%   10/23/22 2.07 L (53%) 0.95 L (32%) 46%   11/17/22 2.04 L (53%) 1.04 L (35%) 51%   01/26/23 1.97 L (54%) 0.97 L (35%) 50%   04/20/23 1.97 L (54%) 1.03 L (37%) 52%   07/23/23 1.70 L (47%) 0.93 L (34%) 55%   07/28/23 1.74 L (49%) 0.98 L (36%) 56%   11/19/23 1.85 L (52%) 1.11 L (41%) 60%   02/11/24 1.85 L (51%) 1.06 L (38%) 58%     Measures are consistent with moderate airway obstruction and restriction. Measures are improved from prior.  Cultures:       Source Bacterial Culture AFB Smear AFB Culture   04/02/15 Sputum 2+ OPF; 1+ Probably mPsA Negative Negative   07/02/15 Sputum 4+ OPF; 3+ mPsA; 3+ sPsA; 1+ Steno Negative Negative   01/23/16 Sputum 2+ OPF; 1+ mPsA; 1+ sPsA - -   03/24/16 Sputum 3+ OPF; 3+ mPsA; 3+ sPsA Negative Overgrown   07/21/16 Sputum 4+ OPF; 4+ mPsA; 2+ sPsA Negative Negative   12/29/16 Sputum 4+ OPF; 3+ mPsA; 3+ sPsA Negative Actinomadura sp   04/06/17 Sputum 3+ OPF; 4+ mPsA Negative Negative 07/06/17 Sputum 4+ OPF; 3+ mPsA Negative Negative   10/12/17 Sputum 4+ OPF; 3+ mPsA Negative Negative   01/18/18 Sputum OPF Negative Negative   05/17/18 Sputum 4+ OPF; 3+ mPsA - -   09/26/18 Sputum 4+ OPF; 3+ mPsA Negative Negative   01/26/19 Sputum 4+ OPF; 2+ mPsA Negative Negative   02/15/19 Sputum 3+ OPF; 3+ mPsA Negative Negative   06/06/19 Sputum 3+ OPF; 3+ sPsA; 3+ mPsA Negative Negative   11/10/19 Sputum 2+ OPF; 3+ mPsA Negative Negative   02/13/20 Sputum OPF Negative Negative   07/02/20 Sputum 3+ OPF; 1+sPsA; 3+ mPsA negative negative   10/31/20 Sputum 4+ OPF; 2+ sPsA; 4+ mPsA negative negative   11/17/20 Sputum 3+ OPF; 3+ mPsA negative negative   02/21/21 Sputum Few PsA negative negative   02/27/21 Sputum Scant C albicans and light A fumigatus - -   03/18/21 Sputum 2+ OPF; 1+ mPsA negative negative   03/26/21 Sputum 4+ OPF; 3+ mPsA negative negative   09/29/21 Sputum 4+ OPF; 4+ sPsA; 4+ mPsA negative negative   04/14/22 Sputum 4+ OPF; 1+ mPsA negative negative   08/04/22 Sputum OPF - -   10/23/22 Sputum 4+ OPF; 2+ mPsA negative negative   11/07/22 Sputum 2+ OPF; 1+ mPsA; 1+ sPsA negative negative   01/26/23 Sputum 1+ OPF; 1+ mPsA negative negative   01/27/23 Sputum 3+ OPF; 2+ mPsA - -   04/17/23 Sputum OPF negative negative   07/23/23 Sputum 3+ OPF; 4+ mPsA; <1+ sPsA negative negative   11/19/23 Sputum OPF negative negative      Radiology:   Chest CT (02/25/21): Images personally reviewed.  Stable sequelae of right middle lobectomy and right lower lobe wedge resection. Stable consolidation in the medial posterior right upper lobe but increased consolidation of the right lung apex with area demonstrating reverse crescent sign suggestive of aspergilloma. Stable bilateral diffuse extensive bronchiectasis, bronchial wall thickening, and sequelae of chronic endobronchial infection.    CTA Chest (04/15/21): Images personally reviewed. Sequelae of right middle lobectomy and right lower lobe wedge resection. Chronic consolidation in the medial posterior right upper lobe and right lung apex with volume loss and architectural distortion of the right upper lobe. Compensatory hyperexpansion of the left lung with mosaic attenuation consistent with areas of air trapping. Extensive chronic bilateral bronchiectasis and bronchial thickening. Unchanged diffuse bilateral tree-in-bud nodularity.  Mildly enlarged and tortuous right bronchial artery, predominantly supplying the right upper lung, arising from the lateral descending aorta at the level of T6 (7:58, 4:38). No enlarged left-sided bronchial arteries. No CT evidence of intrapulmonary arteriovenous malformation.      Chest CT (11/14/21): Images personally reviewed. I agree with radiology interpretation that he continues to have extensive cavitary consolidation in right apex with areas of dependent soft tissue within medial component of cavity; findings unchanged and c/w mycetoma. Ill-defined nodular consolidation in right posterior lung. S/p resection of right lower lobe. Unchanged extensive bronchiectasis. Mosaic  attenuation c/w gas trapping 2/2 small airways disease.      Chest CT (06/15/22): Images personally reviewed.  Minimal change from prior CT though there is a slightly smaller focus of soft tissue/consolidation, possibly reflective of mycetoma, within complex cavitary lesion in the apex of the right lung.  New small focus of endobronchial debris within the cystic airspaces of the lingula.     CXR (08/04/23): Images personally reviewed. Right apical pleural thickening with scarring and volume loss in the right lung apex. Elevated right hemidiaphragm. Extensive bronchiectasis and bronchial wall thickening bilaterally.     Bronchiectasis Evaluation   IgG with subclasses:   Lab Results   Component Value Date/Time    IGGT 996 04/28/2012 06:21 AM    IGG1 402 04/28/2012 06:21 AM    IGG2 399 04/28/2012 06:21 AM    IGG3 148.0 (H) 04/28/2012 06:21 AM    IGG4 47.7 04/28/2012 06:21 AM     IgA (09/21/11): 283 No results found for: IGA  IgM (09/21/11): 52 No results found for: IGM  IgE:   Lab Results   Component Value Date/Time    IGE 16.6 02/13/2020 10:24 AM     Specific Titers: No results found for: TETANUSAB, TETGV, DIPHTERIAAB, DIPGV, S. pneumonia    HIV: No results found for: HIV  Autoimmune: No results found for: ANA, ANATITER1, PAT1, ANATITER2, PAT2, DSDNAAB, ENAS, RF, CCPAB, CCPIGG  ANCA: No results found for: ANCA, IFA, PR3QT, MPO, MPOQT  CBC-D:   Lab Results   Component Value Date/Time    WBC 14.6 (H) 08/02/2023 06:11 AM    WBC 7.9 08/13/2013 03:57 AM    HGB 12.9 08/02/2023 06:11 AM    HGB 14.8 03/24/2015 05:31 PM    HCT 38.3 (L) 08/02/2023 06:11 AM    HCT 30.8 (L) 08/13/2013 03:57 AM    PLT 174 08/02/2023 06:11 AM    PLT 292 08/13/2013 03:57 AM    NEUTROABS 10.8 (H) 08/02/2023 06:11 AM    NEUTROABS 4.9 06/21/2013 09:31 PM    LYMPHSABS 2.3 08/02/2023 06:11 AM    LYMPHSABS 1.9 06/21/2013 09:31 PM    EOSABS 0.1 08/02/2023 06:11 AM    EOSABS 0.2 06/21/2013 09:31 PM    BASOSABS 0.0 08/02/2023 06:11 AM    BASOSABS 0.0 06/21/2013 09:31 PM     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

## 2024-02-12 DIAGNOSIS — J479 Bronchiectasis, uncomplicated: Secondary | ICD-10-CM | POA: Diagnosis not present

## 2024-02-14 NOTE — Progress Notes (Signed)
 [facility] Note    Primary Cardiologist/Cardiology Provider(s):  Brock Memo, MD  PCP:  Frederik Lamar POUR, MD    Last Cardiology Clinic Visit:  02/10/2024  Last IV Diuresis Visit: 02/10/2024    Reason for Visit:  Jordan [name] is a 68 y.o. male with a history of extensie pulmonary disease, chronic hypoxic respiratory failure with supplemental oxygenation, and worsening lower extremitiy edema with a history of elevated pro-BNP, who is being seen today in the [facility] for an urgent visit for volume status optimization including IV diuresis.    Assessment/Plan:  Acute on Chronic HFpEF  - Volume status today is increased.  - NYHA Class II  - IV Lasix  80 mg was administered during clinic adequate response. Patient had another appointment at 10:30, unable to obtain multiplet urine outputs.   - Labs were obtained: Cr 1.26, K 3.9, Pro-BNP 462, Mag 1.9  -Increasing lasix  80 mg daily and adding spironolactone  25 mg daily.   -Wear compression stockings daily.    Response to IV diuresis:         Medications Heart rate Blood pressure Weight (lbs)   Pre  82 122/70 186   Hour 0 IV Lasix  80 mg      Hour 2       Post  82 128/76   185       Output:  I/O         11/23 0701  11/24 0700 11/24 0701  11/25 0700 11/25 0701  11/26 0700    Urine (mL/kg/hr)   350    Total Output(mL/kg)   350 (4.2)    Net   -350           Urine Occurrence   1 x               Return to clinic:    Follow up in one week at IV Diuresis clinic.     Future Appointments   Date Time Provider Department Center   02/15/2024  9:00 AM DIURESIS HF NP [facility]   02/15/2024 10:25 AM Carter Evern Player, MD [facility]   02/28/2024 11:00 AM Sable Potts, MD [facility]   05/16/2024  8:50 AM PFT 2 [facility]   05/16/2024  9:30 AM Blondie Ronal Anette Arlean, MD [facility]   07/24/2024  2:15 PM Pistiolis, Annalee HERO, MD [facility]       History of Present Illness:  Jordan [name] is a 68 y.o. male with a history of extensie pulmonary disease, chronic hypoxic respiratory failure, and worsening lower extremitiy edema with a history of elevated pro-BNP who presents today for IV diuresis. Patient last seen in IV diuresis clinic on 02/10/2024. At that visit, 80mg  IV Lasix  administered with robust output: 186-> 181lbs. He was to continue Lasix  40 mg daily, start magnesium  oxide 400 mg daily and follow up today to assess volume status.     Today he presents for follow up. His  weight is back to what it was upon arrival last week.   Reports seeing pulmonologist on Friday who put him on Ciprofloxacin  and Augmentin  for 14 days with low threshold to admit to hospital. He notes low oxygenation with activity.   Taking lasix  40 mg daily with good response at home and feels better with fluid off.  Reports today the fluid has slowly increased.   Reports elevating feet worsens cough, could try to  do stockings.               Past Medical History[1]  Past Surgical History[2]    Current Medications[3]  Allergies[4]    Objective:      Physical Exam    Home weights:   11/20 178.2 lbs  11/21 174.2 lbs  11/22 176.1 lbs  11/23 175.6 lbs  11/24 175.6 lbs  11/25 176.9 lbs     Wt Readings from Last 12 Encounters:   02/15/24 84.4 kg (186 lb 1.6 oz)   02/11/24 83 kg (183 lb)   02/10/24 82.5 kg (181 lb 12.8 oz)   01/17/24 86.1 kg (189 lb 12.8 oz)   11/19/23 83 kg (183 lb)   07/23/23 80.7 kg (178 lb)   07/23/23 80.7 kg (178 lb)   07/19/23 81.4 kg (179 lb 6.4 oz)   04/20/23 79.3 kg (174 lb 14.4 oz)   02/22/23 78 kg (172 lb)   02/15/23 78.4 kg (172 lb 12.8 oz)   02/08/23 74.4 kg (164 lb)        General:  Patient is chronically ill-appearing in no acute distress   Eyes:  Intact, sclerae anicteric.   Ears, nose, mouth: Benign   Respiratory:   Right upper crackles, No increased effort. Remains on [place].   There are no wheezes.   Cardiovascular:  JVP not seen above the clavicle with HOB at 90 degrees.  Rate and rhythm are regular.  There is no lifts or heaves.  Normal S1, S2. There is no murmur, gallops or rubs.  Radial and pedal pulses are 1-2+, bilaterally.   There is 2+ pitting pedal/pretibial edema, left greater than right. Right moreso nonpitting edema.    Gastrointestinal:   Soft, non-tender, with audible bowel sounds. Abdomen nondistended.  Liver is nonpalpable.   Musculoskeletal: No joint swelling   Skin: Warm, well perfused.   Neurologic: Appropriate mood and affect. Alert and oriented to person, place, and time. No gross motor or sensory deficits evident.     Recent Labs:  Hospital Outpatient Visit on 02/11/2024   Component Date Value Ref Range Status    FVC PRE 02/11/2024 1.84 (L)  2.70 - 4.56 L Final    FEV1 PRE 02/11/2024 1.06 (L)  2.02 - 3.47 L Final    FEV1/FVC PRE 02/11/2024 57.54 (L)  64.09 - 88.04 % Final    FEF25-75% PRE 02/11/2024 0.35 (L)  0.94 - 3.90 L/s Final    PDNQZQ74-24 PRE 02/11/2024 0.35  L/s Final    FIVC PRE 02/11/2024 1.58 (L)  2.70 - 4.56 L Final    FEF50% PRE 02/11/2024 0.48 (L)  1.80 - 6.14 L/s Final    FIF50% PRED 02/11/2024 2.20  L/s Final    PEF PRE 02/11/2024 3.24 (L)  5.69 - 9.95 L/s Final    PIF PRE 02/11/2024 3.01 (L)  3.55 - 10.67 L/s Final    FET PRE 02/11/2024 9.08  sec Final    FET100% Change 02/11/2024 9.05  sec Final    FE%FIF PRE 02/11/2024 17.26  % Final    EOTT PRE 02/11/2024 9.08  sec Final    Vol extrap pre 02/11/2024 0.08  L Final    Grade FVC A19 PRE 02/11/2024 1.00   Final    Grade FEV1 A19 PRE 02/11/2024 1.00   Final    FEV1Q PRE 02/11/2024 2.12    Final    FVC LLN 02/11/2024 2.70   Final    FVC Predicted 02/11/2024 3.62   Final  FVC PreZ-Score 02/11/2024 -3.20   Final    FVC % Predicted PRE 02/11/2024 51 %  % Final    FEV1 LLN 02/11/2024 2.02   Final    FEV1 Predicted 02/11/2024 2.77   Final    FEV1 PreZ-Score 02/11/2024 -3.57   Final    FEV1 % Predicted PRE 02/11/2024 38 %  % Final    FEV1/FVC LLN 02/11/2024 64   Final    FEV1/FVC Predicted 02/11/2024 77   Final    FEV1/FVC PreZ-Score 02/11/2024 -2.30   Final    FEV1/FVC % Predicted PRE 02/11/2024 74 %  % Final    FEF25-75% LLN 02/11/2024 0.94   Final    FEF25-75% Predicted 02/11/2024 2.17   Final    FEF25-75% PreZ-Score 02/11/2024 -2.88   Final    FEF25-75% % Predicted PRE 02/11/2024 16 %  % Final    FVCIN LLN 02/11/2024 2.70   Final    FIVC Predicted 02/11/2024 3.62   Final    FVCIN PreZ-Score 02/11/2024 -3.68   Final    FIVC % Predicted PRE 02/11/2024 44 %  % Final    FEF50% LLN 02/11/2024 1.80   Final    FEF50% Predicted 02/11/2024 3.97   Final    FEF50% PreZ-Score 02/11/2024 -2.64   Final    FEF50% % Predicted PRE 02/11/2024 12 %  % Final    PEF LLN 02/11/2024 5.69   Final    PEF Predicted 02/11/2024 7.82   Final    PEF PreZ-Score 02/11/2024 -3.54   Final    PEF % Predicted PRE 02/11/2024 41 %  % Final    PIF LLN 02/11/2024 3.55   Final    PIF Predicted 02/11/2024 7.11   Final    PIF PreZ-Score 02/11/2024 -0.21   Final    PIF % Predicted PRE 02/11/2024 42 %  % Final    FVC PreZ-Score 02/11/2024 -3.20   Final    FEV1 PreZ-Score 02/11/2024 -3.57   Final    FEV1/FVC PreZ-Score 02/11/2024 -2   Final    FVC %CHANGE 02/11/2024 5.5  % Final    FEV1 %CHANGE 02/11/2024 7.5  % Final    BMI PRE 02/11/2024 29    Final   Office Visit on 02/11/2024   Component Date Value Ref Range Status    Lower Respiratory Culture 02/11/2024 OROPHARYNGEAL FLORA ISOLATED   Preliminary    Gram Stain 02/11/2024 >25 PMNS/LPF   Preliminary    Gram Stain 02/11/2024 10-25 Epithelial cells/LPF   Preliminary    Gram Stain 02/11/2024 3+ Gram positive cocci   Preliminary    Gram Stain 02/11/2024 Acceptable for culture   Preliminary    AFB Smear 02/11/2024 NO ACID FAST BACILLI SEEN- 3 negative smears do not exclude pulmonary TB. If active pulmonary TB is suspected, continue airborne isolation until pulmonary disease is excluded by negative cultures.   Final   Office Visit on 02/10/2024   Component Date Value Ref Range Status    Sodium 02/10/2024 140  135 - 145 mmol/L Final    Potassium 02/10/2024 4.0  3.4 - 4.8 mmol/L Final    Chloride 02/10/2024 99  98 - 107 mmol/L Final    CO2 02/10/2024 30.6  20.0 - 31.0 mmol/L Final    Anion Gap 02/10/2024 10  5 - 14 mmol/L Final    BUN 02/10/2024 19  9 - 23 mg/dL Final    Creatinine 88/79/7974 1.08  0.73 - 1.18 mg/dL Final    BUN/Creatinine Ratio  02/10/2024 18   Final    eGFR CKD-EPI (2021) Male 02/10/2024 74  >=60 mL/min/1.22m2 Final    Glucose 02/10/2024 89  70 - 179 mg/dL Final    Calcium  02/10/2024 9.5  8.7 - 10.4 mg/dL Final    Albumin 88/79/7974 3.7  3.4 - 5.0 g/dL Final    Total Protein 02/10/2024 7.0  5.7 - 8.2 g/dL Final    Total Bilirubin 02/10/2024 0.3  0.3 - 1.2 mg/dL Final    AST 88/79/7974 17  <=34 U/L Final    ALT 02/10/2024 12  10 - 49 U/L Final    Alkaline Phosphatase 02/10/2024 69  46 - 116 U/L Final    Magnesium  02/10/2024 1.5 (L)  1.6 - 2.6 mg/dL Final    PRO-BNP 88/79/7974 600.0 (H)  <=300.0 pg/mL Final       Lab Results   Component Value Date    PRO-BNP 600.0 (H) 02/10/2024    PRO-BNP 721.0 (H) 01/26/2023    Creatinine Whole Blood, POC 1.2 04/15/2021    Creatinine Whole Blood, POC 0.4 (L) 06/12/2014    Creatinine Whole Blood, POC 1.4 06/12/2014    Creatinine 1.08 02/10/2024    Creatinine 1.02 08/03/2023    Creatinine 0.98 08/02/2023    Creatinine 1.07 07/23/2023    Creatinine 1.05 01/29/2023    Creatinine 1.50 (H) 08/13/2013    Creatinine 1.37 (H) 08/12/2013    Creatinine 1.38 (H) 08/11/2013    Creatinine 1.47 (H) 08/10/2013    Creatinine 1.47 (H) 08/09/2013    BUN 19 02/10/2024    BUN 34 (H) 08/03/2023    BUN 32 (H) 08/02/2023    BUN 27 (H) 07/23/2023    BUN 23 01/29/2023    BUN 24 (H) 08/13/2013    BUN 24 (H) 08/12/2013    BUN 19 08/11/2013    BUN 22 (H) 08/10/2013    BUN 28 (H) 08/09/2013    Sodium 140 02/10/2024    Sodium 142 08/03/2023    Sodium 144 08/02/2023    Sodium 142 07/23/2023    Sodium 143 01/29/2023    Sodium 135 08/13/2013    Sodium 135 08/12/2013    Sodium 137 08/11/2013    Sodium 134 (L) 08/10/2013    Sodium 132 (L) 08/09/2013    Sodium Whole Blood 137 03/24/2015    Potassium 4.0 02/10/2024    Potassium 4.0 08/03/2023    Potassium 4.1 08/02/2023    Potassium 4.1 07/23/2023    Potassium 3.9 01/29/2023    Potassium 3.8 08/13/2013    Potassium 3.6 08/12/2013    Potassium 4.0 08/11/2013    Potassium 4.2 08/10/2013    Potassium 4.0 08/09/2013    Potassium, Bld 3.9 03/24/2015    Magnesium  1.5 (L) 02/10/2024    Magnesium  1.5 (L) 12/22/2021    Magnesium  1.6 11/21/2021    Magnesium  1.6 11/14/2021    Magnesium  1.9 11/20/2020    Magnesium  2.0 08/13/2013    Magnesium  2.0 08/12/2013    Magnesium  1.7 08/11/2013    Magnesium  1.7 08/10/2013    Magnesium  2.0 08/09/2013       Metric Tracker:  Did today's visit result in ED visit? No  Did today's visit result in hospital admission? No  Did today's visit result in referral to cardiology? Yes  Today's visit was a referral from [facility] Cardiology     I attest that I have reviewed the Nurse Practitioner Student Luetta Estock, RN) note and that the components of the history of the present illness, the  physical exam, and the assessment and plan documented were performed by me or were performed in my presence by the student where I verified the documentation and performed (or re-performed) the exam and medical decision making. Damien MARLA [name], AGNP-C         [1]   Past Medical History:  Diagnosis Date    Abscess of lung    (CMS-HCC) 11/06/2010    CT Chest 10/22/10 12/05/2010 right thoracotomy resection of right middle lobe and resection of right lower lobe abscess     Arthritis     back    Biceps tendon tear 2013    left side    Bronchiectasis (CMS-HCC)     chronic psuedomonas infection    Degenerative joint disease of left acromioclavicular joint     GERD (gastroesophageal reflux disease)     Hypertension     Pneumonia 2012    Lung abcess     PONV (postoperative nausea and vomiting)     Prostate cancer    (CMS-HCC) 09/09/2017    Rotator cuff injury left Vertigo    [2]   Past Surgical History:  Procedure Laterality Date    APPENDECTOMY  1969    BACK SURGERY      BRONCHOSCOPY  10/19/2010    [facility]    CARPAL TUNNEL RELEASE  1999    IR EMBOLIZATION HEMORRHAGE ART OR VEN  LYMPHATIC EXTRAVASATION  07/11/2021    IR EMBOLIZATION HEMORRHAGE ART OR VEN  LYMPHATIC EXTRAVASATION 07/11/2021 Ivonne Butler Seed, MD IMG VIR H&V [facility]    LUNG REMOVAL, PARTIAL Right 10/2010    RML and RLL partially resected    MENISCECTOMY  2011    NECK SURGERY  1993, 1999, 2002    PR COLSC FLX W/RMVL OF TUMOR POLYP LESION SNARE TQ Left 02/08/2023    Procedure: COLONOSCOPY FLEX; W/REMOV TUMOR/LES BY SNARE;  Surgeon: Rhoderick Burnard Norris, MD;  Location: [facility];  Service: Gastroenterology    PR GERD TST W/ MUCOS IMPEDE ELECTROD,>1HR N/A 07/06/2012    Procedure: ESOPHAGEAL FUNCTION TEST, GASTROESOPHAGEAL REFLUX TEST W/ NASAL CATHETER INTRALUMINAL IMPEDANCE ELECTRODE(S) PLACEMENT, RECORDING, ANALYSIS AND INTERPRETATION; PROLONGED;  Surgeon: None None;  Location: [facility];  Service: Gastroenterology    PR OPEN TREAT RIB FRACTURE W/INT FIXATION, UNILATERAL, 1-2 RIBS Right 03/19/2015    Procedure: OPEN TREATMENT OF RIB FRACTURE REQUIRING INTERNAL FIXATION, UNILATERAL; 1-2 RIBS;  Surgeon: Morene Nadara Diamond, MD;  Location: MAIN OR [facility];  Service: Cardiothoracic    PR THORACOTOMY W/THERAP WEDGE RESEXN ADDL IPSILATRL Right 08/07/2013    Procedure: THORACOTOMY; WITH THERAPEUTIC LOBECTOMY OF RIGHT MIDDLE AND RIGHT LOWER LOBE RESECTION , EACH ADDITIONAL RESECTION, IPSILATERAL;  Surgeon: Morene FORBES Diamond, MD;  Location: MAIN OR [place];  Service: Cardiothoracic    TONSILLECTOMY  1965   [3]   Current Outpatient Medications   Medication Sig Dispense Refill    albuterol  2.5 mg /3 mL (0.083 %) nebulizer solution INHALE 3 ML BY NEBULIZATION THREE (3) TIMES A DAY. 825 mL 3    albuterol  HFA 90 mcg/actuation inhaler Inhale 2 puffs four (4) times a day as needed. 18 g 11    alcohol  swabs  (ALCOHOL  PREP PADS) PadM Use as directed with inhaled antibiotics 100 each 5    amoxicillin -clavulanate (AUGMENTIN ) 875-125 mg per tablet Take 1 tablet by mouth two (2) times a day for 14 days. 28 tablet 0    ascorbic acid , vitamin C , (VITAMIN C ) 1000 MG tablet Take 1 tablet (1,000 mg  total) by mouth daily.      atorvastatin  (LIPITOR ) 10 MG tablet Take 1 tablet (10 mg total) by mouth daily.      azithromycin  (ZITHROMAX ) 250 MG tablet TAKE 1 TABLET BY MOUTH EVERY DAY 90 tablet 3    brensocatib 25 mg tablet Take 1 tablet (25 mg total) by mouth daily. 30 tablet 11    cholecalciferol , vitamin D3-50 mcg, 2,000 unit,, 50 mcg (2,000 unit) tablet Take 1 tablet (50 mcg total) by mouth daily.      ciprofloxacin  HCl (CIPRO ) 750 MG tablet Take 1 tablet (750 mg total) by mouth two (2) times a day. 28 tablet 0    colistimethate  (COLYMYCIN) 150 mg injection Inject 2 mL sterile water  for injection to mix colistin vial, then draw up 2 mL (150mg ) and inhale 2 times a day, 28 days on and 28 days off. 60 each 5    dilTIAZem  (CARDIZEM  CD) 120 MG 24 hr capsule TAKE 1 CAPSULE BY MOUTH EVERY DAY 90 capsule 3    dornase alfa  (PULMOZYME ) 1 mg/mL nebulizer solution Inhale 1 ampule (2.5 mg) via nebulizer daily. Use at least 30-60 minutes before airway clearance, or after airway clearance. 75 mL 11    dupilumab  (DUPIXENT  SYRINGE) 200 mg/1.14 mL syringe Inject the contents of 1 syringe (200 mg total) under the skin every fourteen (14) days. 2.3 mL 11    empty container (SHARPS CONTAINER) Misc USE AS DIRECTED 1 each 0    empty container Misc USE AS DIRECTED 1 each 2    empty container Misc Use as directed to dispose of needles. When full, make sure lid is closed tightly then dispose of container in trash. 1 each 2    ensifentrine  (OHTUVAYRE ) 3 mg/2.5 mL NbSp Inhale 3 mg two (2) times a day. 75 mL 11    EPINEPHrine  (EPIPEN ) 0.3 mg/0.3 mL injection Inject 0.3 mL (0.3 mg total) into the muscle once as needed for anaphylaxis (may repeat if needed) for up to 2 doses. 2 each 0    ferrous sulfate  325 (65 FE) MG tablet Take 1 tablet (325 mg total) by mouth Two (2) times a day. BID      fluticasone  propionate (FLONASE ) 50 mcg/actuation nasal spray 1 spray into each nostril daily.      fluticasone -umeclidin-vilanter (TRELEGY ELLIPTA ) 100-62.5-25 mcg inhaler INHALE 1 PUFF DAILY 90 each 3    furosemide  (LASIX ) 40 MG tablet Take 1 tablet (40 mg total) by mouth daily. 90 tablet 0    HYDROcodone -acetaminophen  (NORCO ) 7.5-325 mg per tablet Take 1 tablet by mouth three (3) times a day (at 6am, noon and 6pm).      HYDROcodone -chlorpheniramine  polistirex (TUSSIONEX PENNKINETIC ) 10-8 mg/5 mL ER suspension Take 5 mL by mouth every twelve (12) hours as needed for cough. 115 mL 0    magnesium  oxide (MAG-OX) 400 mg (241.3 mg elemental magnesium ) tablet Take 1 tablet (400 mg total) by mouth daily. 30 tablet 11    montelukast  (SINGULAIR ) 10 mg tablet TAKE 1 TABLET BY MOUTH EVERY DAY AT NIGHT 90 tablet 3    multivitamin-minerals-lutein Tab Take 1 tablet by mouth daily.      nebulizers (LC PLUS) Misc Use with inhaled medications 1 each 5    nebulizers (LC PLUS) Misc Use as directed 4 each 3    nebulizers Misc use with nebulized medications 12 each 0    needle, disp, 21 G (BD REGULAR BEVEL NEEDLES) 21 gauge x 1 1/2 Ndle Use as directed with inhaled  Colistin 100 each 3    NON FORMULARY Take 650 mg by mouth two (2) times a day. Brand: Citracal 650mg       omeprazole (PRILOSEC) 20 MG capsule Take 1 capsule (20 mg total) by mouth Two (2) times a day.      OXYGEN -AIR DELIVERY SYSTEMS MISC Inhale 2 mL.      predniSONE  (DELTASONE ) 10 MG tablet Take 40mg  (4 tabs) daily x5 day, then 30mg  (3 tabs) daily x5 day, then 20mg  (2 tabs) daily x 5 day, then 10mg  (1 tab) daily x5 day, then off 50 tablet 0    sodium chloride  (BD POSIFLUSH NORMAL SALINE 0.9) 0.9 % injection Inject 2mL of 0.9%NaCl into colistin vial & gently mix. After withdrawing colistin dose, add an additional 1mL of 0.9%NaCl to neb cup with the colistin dose. 180 mL 11    sodium chloride  10 % Nebu Measure and Inhale 5 mL by nebulization Two (2) times a day. Discard remaining amount and use a new vial for each dose. 900 mL 3    sterile water  Soln Use 2mL to mix Colistin, then add additional 2mL to neb cup with 2mL of mixed colistin. Inhale twice daily 28 days on and 28 days off. 600 mL 5    syringe with needle (BD LUER-LOK SYRINGE) 3 mL 20 gauge x 1 1/2 Syrg For use with inhaled antibiotic (Colistin) 60 each 30    syringe with needle (BD LUER-LOK SYRINGE) 3 mL 21 gauge x 1 Syrg For use with inhaled antibiotic (Colistin) 60 each 5    tobramycin , PF, ([name] ) 300 mg/5 mL nebulizer solution Inhale the contents of 1 ampule (300 mg total) by nebulization every twelve (12) hours. 28 days on and 28 days off. 280 mL 5    zinc  gluconate 50 mg (7 mg elemental zinc ) tablet Take 1 tablet (50 mg total) by mouth.      zoledronic  acid-mannitol &water  (RECLAST ) 5 mg/100 mL PgBk Infusion       No current facility-administered medications for this visit.   [4]   Allergies  Allergen Reactions    Gentamicin Other (See Comments)     BALANCE ISSUES    Tobramycin  Other (See Comments)     ototoxicity    Mirtazapine  Other (See Comments)     Lower extremity swelling    Morphine Nausea And Vomiting

## 2024-02-15 ENCOUNTER — Ambulatory Visit
Admit: 2024-02-15 | Discharge: 2024-02-16 | Payer: Medicare (Managed Care) | Attending: Adult Health | Primary: Adult Health

## 2024-02-15 ENCOUNTER — Ambulatory Visit
Admit: 2024-02-15 | Discharge: 2024-02-16 | Payer: Medicare (Managed Care) | Attending: "Endocrinology | Primary: "Endocrinology

## 2024-02-15 DIAGNOSIS — I5033 Acute on chronic diastolic (congestive) heart failure: Secondary | ICD-10-CM | POA: Diagnosis not present

## 2024-02-15 DIAGNOSIS — E8770 Fluid overload, unspecified: Secondary | ICD-10-CM | POA: Diagnosis not present

## 2024-02-15 DIAGNOSIS — M810 Age-related osteoporosis without current pathological fracture: Secondary | ICD-10-CM | POA: Diagnosis not present

## 2024-02-15 DIAGNOSIS — M818 Other osteoporosis without current pathological fracture: Secondary | ICD-10-CM | POA: Diagnosis not present

## 2024-02-15 LAB — BASIC METABOLIC PANEL
ANION GAP: 11 mmol/L (ref 5–14)
BLOOD UREA NITROGEN: 21 mg/dL (ref 9–23)
BUN / CREAT RATIO: 17
CALCIUM: 9.1 mg/dL (ref 8.7–10.4)
CHLORIDE: 96 mmol/L — ABNORMAL LOW (ref 98–107)
CO2: 32.6 mmol/L — ABNORMAL HIGH (ref 20.0–31.0)
CREATININE: 1.26 mg/dL — ABNORMAL HIGH (ref 0.73–1.18)
EGFR CKD-EPI (2021) MALE: 62 mL/min/1.73m2 (ref >=60)
GLUCOSE RANDOM: 90 mg/dL (ref 70–179)
POTASSIUM: 3.9 mmol/L (ref 3.4–4.8)
SODIUM: 140 mmol/L (ref 135–145)

## 2024-02-15 LAB — MAGNESIUM: MAGNESIUM: 1.9 mg/dL (ref 1.6–2.6)

## 2024-02-15 LAB — PRO-BNP: PRO-BNP: 462 pg/mL — ABNORMAL HIGH (ref <=300.0)

## 2024-02-15 MED ORDER — FUROSEMIDE 40 MG TABLET
ORAL_TABLET | Freq: Every day | ORAL | 0 refills | 90.00000 days
Start: 2024-02-15 — End: 2024-05-15

## 2024-02-15 MED ORDER — SPIRONOLACTONE 25 MG TABLET
ORAL_TABLET | Freq: Every day | ORAL | 11 refills | 30.00000 days | Status: CP
Start: 2024-02-15 — End: 2025-02-14

## 2024-02-15 MED ADMIN — furosemide (LASIX) injection 80 mg: 80 mg | INTRAVENOUS | @ 15:00:00 | Stop: 2024-02-15

## 2024-02-15 NOTE — Patient Instructions (Signed)
 Please schedule your next reclast  infusion. Please call the infusion center if you have not heard from them within 2 weeks for your appointment. The number is: [phone].   Schedule bone density study please. Order placed.

## 2024-02-15 NOTE — Progress Notes (Signed)
 Assessment/Plan:   The patient has osteoporosis at the hip. No fracture history. The patient has the following risk factors for osteoporosis: chronic glucocorticoid use. Differential diagnosis includes primary vs secondary causes of osteoporosis. Prior lab evaluation showed normal PTH, TSH, creatinine, Lfts, vitamin D levels.     We discussed the importance of adequate calcium  intake (~1200 mg daily) and he currently meets this requirement. Discussed weight bearing exercise for bone health. Recheck vit D level.     Previously on alendronate for 1.5 years. His DEXA in 2023 was stable compared to 1 year prior and he has trouble swallowing the pills. Thus we preferred to switch to reclast . He received this on 02/26/2022 and 03/04/2023 and tolerated treatment well. Due for his last dose next month. Orders placed. Repeat DEXA ordered as well to ensure treatment holiday can be initiated afterwards. Would then have him follow up with PCP for q2yr DEXA.     All questions were answered and patient agrees with plan.     Follow up with Endocrinologist (After Tests).      [name], MD  [facility] Endocrinology at [facility]  Phone: [phone]   Fax: [phone]    This note was completed with the assistance of voice recognition software.  Please excuse grammatical errors.          Subjective:     History of Present Illness:  Jordan [name] is a 68 year old male with history of HTN, bronchiectasis, pulmonary aspergillosis, GERD, memory loss, dizziness, hemoptysis here for follow up for osteoporosis. Last seen in 01/2023. History was obtained from the patient and review of the electronic medical record.     Diagnosis: 07/2020. Was on steroids for many years for his lung conditions. Now off oral steroids. He is on symbicort . Has lost 3-4 inches in height. Was previously followed by endocrinology locally in [facility] and wants to have his doctors under one roof. Has been on alendronate 70 mg weekly but had trouble swallowing pills. DEXA was stable on treatment. We switched to reclast  and he received his first dose 01/29/2022. Tolerated this well.     Today, he reports feeling well. Recently admitted for acute exacerbation of bronchiectasis. Denies jaw, hip pain. Denies interim falls, fracture, kidney stone. No dental work planned.     DEXA history: see below    Fragility fractures: no  Other fractures history: no    Family history of osteoporosis or hip fracture: no    History of:  Thyroid disease: no  Elevated calcium  levels: no  Multiple Myeloma: no  Cancer: no  Heartburn or reflux: yes but well controlled on omeprazole 20 mg daily  Kidney stones: yes, many years ago.   CVD: no. Has dilated aorta and on cardizem .   Radiation treatment: no    Dental work coming up? No, sees dentist every 4 months.     Exercise: on oxygen .     Diet: yogurt daily.   ETOH: no  Tobacco: no    Supplements:   Calcium : citracal 650 mg twice a day.   Vitamin D: 2000 units daily.   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  Objective:     Physical Exam:  BP 128/76 (BP Position: Sitting)  - Pulse 84  - Ht 167.6 cm (5' 5.98)  - Wt 84.3 kg (185 lb 12.8 oz)  - BMI 30.00 kg/m??   General appearance - alert, well appearing, and in no distress, wears nasal cannula.   Spine: nontender to palpation. No instability.    Wt Readings from Last 6 Encounters:   02/15/24 84.3 kg (185 lb 12.8 oz)   02/15/24 84.1 kg (185 lb 6.4 oz)   02/11/24 83 kg (183 lb)   02/10/24 82.5 kg (181 lb 12.8 oz)   01/17/24 86.1 kg (189 lb 12.8 oz)   11/19/23 83 kg (183 lb)       Lab Review:    Lab Results   Component Value Date    CALCIUM  9.1 02/15/2024    CALCIUM  9.5 02/10/2024    CALCIUM  9.8 08/03/2023    PHOS 3.7 01/22/2016    PHOS 4.1 06/23/2013    PHOS 4.0 06/21/2013    CREATININE 1.26 (H) 02/15/2024    CREATININE 1.08 02/10/2024    CREATININE 1.02 08/03/2023    VITDTOTAL 55.9 02/15/2023    VITDTOTAL 50.0 01/13/2022    ALBUMIN 3.7 02/10/2024    ALBUMIN 3.7 07/23/2023    ALBUMIN 3.5 01/26/2023       TSH (uIU/mL)   Date Value   10/14/2021 1.334     Free T4 (ng/dL)   Date Value   92/74/7976 1.19     Imaging:   Impression      1.WHO classification: OSTEOPOROSIS.        Narrative   EXAM: DEXA BONE DENSITY SKELETAL   DATE: 02/03/2022 9:51 AM   ACCESSION: [id number]   DICTATED: 02/03/2022 12:59 PM   INTERPRETATION LOCATION: MAIN CAMPUS      CLINICAL INDICATION: 68 years old Male with osteoporosis with history of chronic glucocorticoid use. Has been on fosamax x 1.5 years. Please evaluate bone density.  - M81.0 - Osteoporosis, unspecified osteoporosis type, unspecified pathological fracture presence        COMPARISON: None.      TECHNIQUE: Bone mineral density was performed using the Horizon A bone densitometer.  The results of the study are expressed in bone mineral density (BMD) and interpreted using World Health Organization [facility]) criteria, per ISCD positions.      FINDINGS:      Lumbar Spine (L1-L4)       Excluded Levels: None  excluded.     BMD: 1.186 (g/cm)       T-score: 0.9     WHO classification: NORMAL BONE MINERAL DENSITY.        Left Hip       Femoral Neck:           BMD: 0.590 (g/cm)           T-score: -2.5     Total Hip           BMD: 0.699 (g/cm)             T-score: -2.2     WHO classification: OSTEOPOROSIS.      Notes:       Lumbar Spine Comparison:    Note that levels L1-L4 are used for comparison purposes and any excluded levels (if applicable) may be included in the current and prior comparison BMD calculations.     Hip T-Score:  For the measurement of the hip T-score, the lowest of either the Total Hip or Femoral Neck BMD  values is used.        Secondary causes of bone loss should be evaluated if clinically indicated since the etiology of low BMD cannot be determined by BMD measurement alone.

## 2024-02-15 NOTE — Patient Instructions (Signed)
 [facility] Clinic Discharge Instructions:    MEDICATIONS:  We are changing your medications today.  --INCREASE Lasix  to 80 mg daily in the morning.   -Continue taking the magnesium  oxide 400 mg daily.   Call if you have questions about your medications.    IMPORTANT INSTRUCTIONS:  - Weigh yourself daily in the morning; write your weight down and bring it with you next time   - Limit your fluid intake to 2 Liters (half-gallon) per day    - Limit your salt intake to 2-3 grams (2000-3000 mg) per day  - If you have a morning diuresis appointment, don't take your morning diuretic  - If you have an afternoon diuresis appointment, take your morning diuretic    LABS:  We will call you if your labs need attention    NEXT APPOINTMENT:  Return to clinic in 1 week with Damien [name], AGNP at [facility].       My office number (c/o Elenor Mulch RN) is [phone] if you need further assistance, or need to schedule with our IV Diuresis clinic in the future.    If you need to reschedule future appointments, please call [phone].   After office hours, if you have urgent questions/problems, contact the on-call cardiologist through the hospital operator: [phone].    Please do not send a MyChart message for potentially life-threatening symptoms or time-sensitive issues. MyChart messages are not monitored after normal business hours or on weekends. Please call 911 for a true medical emergency.

## 2024-02-20 DIAGNOSIS — J9611 Chronic respiratory failure with hypoxia: Secondary | ICD-10-CM | POA: Diagnosis not present

## 2024-02-20 DIAGNOSIS — J479 Bronchiectasis, uncomplicated: Secondary | ICD-10-CM | POA: Diagnosis not present

## 2024-02-21 DIAGNOSIS — M818 Other osteoporosis without current pathological fracture: Principal | ICD-10-CM

## 2024-02-22 DIAGNOSIS — C61 Malignant neoplasm of prostate: Secondary | ICD-10-CM | POA: Diagnosis not present

## 2024-02-22 DIAGNOSIS — I10 Essential (primary) hypertension: Secondary | ICD-10-CM | POA: Diagnosis not present

## 2024-02-22 DIAGNOSIS — J479 Bronchiectasis, uncomplicated: Secondary | ICD-10-CM | POA: Diagnosis not present

## 2024-02-23 ENCOUNTER — Ambulatory Visit
Admit: 2024-02-23 | Discharge: 2024-02-24 | Payer: Medicare (Managed Care) | Attending: Adult Health | Primary: Adult Health

## 2024-02-23 DIAGNOSIS — I5033 Acute on chronic diastolic (congestive) heart failure: Secondary | ICD-10-CM | POA: Diagnosis not present

## 2024-02-23 LAB — BASIC METABOLIC PANEL
ANION GAP: 11 mmol/L (ref 5–14)
BLOOD UREA NITROGEN: 22 mg/dL (ref 9–23)
BUN / CREAT RATIO: 15
CALCIUM: 9.5 mg/dL (ref 8.7–10.4)
CHLORIDE: 96 mmol/L — ABNORMAL LOW (ref 98–107)
CO2: 31.9 mmol/L — ABNORMAL HIGH (ref 20.0–31.0)
CREATININE: 1.46 mg/dL — ABNORMAL HIGH (ref 0.73–1.18)
EGFR CKD-EPI (2021) MALE: 52 mL/min/1.73m2 — ABNORMAL LOW (ref >=60)
GLUCOSE RANDOM: 82 mg/dL (ref 70–179)
POTASSIUM: 3.6 mmol/L (ref 3.4–4.8)
SODIUM: 139 mmol/L (ref 135–145)

## 2024-02-23 LAB — MAGNESIUM: MAGNESIUM: 1.8 mg/dL (ref 1.6–2.6)

## 2024-02-23 LAB — PRO-BNP: PRO-BNP: 429 pg/mL — ABNORMAL HIGH (ref <=300.0)

## 2024-02-23 NOTE — Progress Notes (Signed)
 [facility] Note    Primary Cardiologist/Cardiology Provider(s):  Brock Memo MD  PCP:  Frederik Lamar POUR, MD    Last Cardiology Clinic Visit:  02/15/2024  Last IV Diuresis Visit: 02/15/2024    Reason for Visit:  Jordan [name] is a 68 y.o. male with a history of extensive pulmonary disease, chronic hypoxic respiratory failure with supplemental oxygenation, and worsening lower extremitiy edema with a history of elevated pro-BNP,, who is being seen today in the [facility] for an urgent visit for volume status optimization including IV diuresis.    Assessment/Plan:  Acute on Chronic HFpEF  - Volume status today is improved. Weight 186 --> 181  -No diuresis given today in clinic due to improved volume status and weight loss from last visit.   - NYHA Class II  - Labs were obtained: Cr 1.46, K 3.6, Pro-BNP 429.0, Mag 1.8  - Decrease lasix  to 60 mg daily and monitor Cr.     Response to IV diuresis:         Medications Heart rate Blood pressure Weight (lbs)   Pre  89 121/53 181   Hour 0 IV Lasix  0mg       Hour 2       Post             Output:  I/O       None            Return to clinic:    Follow up as needed, monitor fluid status and daily weights.     Future Appointments   Date Time Provider Department Center   02/23/2024  9:00 AM DIURESIS HF NP [place] [facility]   02/25/2024 11:30 AM [place] DEXA RM 1 UNCIMGDEXAET [place] [facility]   02/28/2024 11:00 AM Sable Potts, MD [facility]   03/06/2024  3:30 PM UNCTIF 19 UNCTHERINFET [facility]   05/16/2024  8:50 AM PFT 2 UNCPULSPFUET [facility]   05/16/2024  9:30 AM Blondie Ronal Anette Arlean, MD UNCPULSPCLET [facility]   07/24/2024  2:15 PM Pistiolis, Annalee HERO, MD UN[facility]RT[facility]       History of Present Illness:  Jordan [name] is a 68 y.o. male with a history of extensive pulmonary disease, chronic hypoxic respiratory failure with supplemental oxygenation, and worsening lower extremitiy edema with a history of elevated pro-BNP, who presents today for IV diuresis. Patient last seen in IV diuresis clinic on 02/15/2024 and was given IV lasix  80 mg in the clinic. He stated he was started on Ciprofloxacin  and Augmentin  by his pulmonologist who had a low threshold to admit him to the hospital for bronchiectasis exacerbation. Patient instructed to increase lasix  to 80 mg daily and to add spironolactone  25 mg daily. Patient educated on wearing compression stockings daily due to inability to elevate legs due to increasing coughing spells.      Today he presents to the clinic to follow up on his fluid status after the increase in medication.   Reports having lost weight this past week, seems to have gained some weight in legs this morning due to not have taken his medications.   Work of breathing has increased, attributes to his lungs rather than fluid, remains on antibiotic course until Friday and reassess need for hospital admission at that time w/ his pulmonologist. Increased O2 via [place] to 3L in the last couple weeks.     Reports taking spironolactone  and lasix  80 mg daily, with  good UOP consistent throughout the day. No use of compression stockings.      Past Medical History[1]  Past Surgical History[2]    Current Medications[3]  Allergies[4]    Objective:      Physical Exam      Home weights:  11/20 178.2  11/21 174.3  11/22 176.1  11/23 175.6  11/24 175.6  11/25 176.9 diuresis  11/26 174.0  11/27 173.0  No weights out of town 11/28-11/30  12/1 171.8  12/2 170.3  12/3 172.7    Visit Vitals  BP 121/53 (BP Site: R Arm, BP Position: Sitting, BP Cuff Size: Medium)   Pulse 89      Wt Readings from Last 12 Encounters:   02/23/24 82.5 kg (181 lb 14.4 oz)   02/15/24 84.3 kg (185 lb 12.8 oz)   02/15/24 84.1 kg (185 lb 6.4 oz)   02/11/24 83 kg (183 lb)   02/10/24 82.5 kg (181 lb 12.8 oz)   01/17/24 86.1 kg (189 lb 12.8 oz)   11/19/23 83 kg (183 lb)   07/23/23 80.7 kg (178 lb)   07/23/23 80.7 kg (178 lb)   07/19/23 81.4 kg (179 lb 6.4 oz) 04/20/23 79.3 kg (174 lb 14.4 oz)   02/22/23 78 kg (172 lb)          General:  Patient is chronically ill-appearing in no acute distress   Eyes:  Intact, sclerae anicteric.   Ears, nose, mouth: Benign   Respiratory:   Right upper crackles with mild increase in respiratory effort.  There are no wheezes.   Cardiovascular:  JVP not seen above the clavicle with HOB at 90 degrees.  Rate and rhythm are regular.  There is no lifts or heaves.  Normal S1, S2. There is no murmur, gallops or rubs.  Radial and pedal pulses are 1-2+, bilaterally.   There is mild+ nonpitting edema, moreso L ankle than R leg.    Gastrointestinal:   Soft, non-tender, with audible bowel sounds. Abdomen nondistended.  Liver is nonpalpable.   Musculoskeletal: No joint swelling   Skin: Warm, well perfused.   Neurologic: Appropriate mood and affect. Alert and oriented to person, place, and time. No gross motor or sensory deficits evident.     Recent Labs:  No visits with results within 1 Week(s) from this visit.   Latest known visit with results is:   Office Visit on 02/15/2024   Component Date Value Ref Range Status    Magnesium  02/15/2024 1.9  1.6 - 2.6 mg/dL Final    PRO-BNP 88/74/7974 462.0 (H)  <=300.0 pg/mL Final    Sodium 02/15/2024 140  135 - 145 mmol/L Final    Potassium 02/15/2024 3.9  3.4 - 4.8 mmol/L Final    Chloride 02/15/2024 96 (L)  98 - 107 mmol/L Final    CO2 02/15/2024 32.6 (H)  20.0 - 31.0 mmol/L Final    Anion Gap 02/15/2024 11  5 - 14 mmol/L Final    BUN 02/15/2024 21  9 - 23 mg/dL Final    Creatinine 88/74/7974 1.26 (H)  0.73 - 1.18 mg/dL Final    BUN/Creatinine Ratio 02/15/2024 17   Final    eGFR CKD-EPI (2021) Male 02/15/2024 62  >=60 mL/min/1.65m2 Final    Glucose 02/15/2024 90  70 - 179 mg/dL Final    Calcium  02/15/2024 9.1  8.7 - 10.4 mg/dL Final       Lab Results   Component Value Date    PRO-BNP 462.0 (H) 02/15/2024  PRO-BNP 600.0 (H) 02/10/2024    PRO-BNP 721.0 (H) 01/26/2023    Creatinine Whole Blood, POC 1.2 04/15/2021    Creatinine Whole Blood, POC 0.4 (L) 06/12/2014    Creatinine Whole Blood, POC 1.4 06/12/2014    Creatinine 1.26 (H) 02/15/2024    Creatinine 1.08 02/10/2024    Creatinine 1.02 08/03/2023    Creatinine 0.98 08/02/2023    Creatinine 1.07 07/23/2023    Creatinine 1.50 (H) 08/13/2013    Creatinine 1.37 (H) 08/12/2013    Creatinine 1.38 (H) 08/11/2013    Creatinine 1.47 (H) 08/10/2013    Creatinine 1.47 (H) 08/09/2013    BUN 21 02/15/2024    BUN 19 02/10/2024    BUN 34 (H) 08/03/2023    BUN 32 (H) 08/02/2023    BUN 27 (H) 07/23/2023    BUN 24 (H) 08/13/2013    BUN 24 (H) 08/12/2013    BUN 19 08/11/2013    BUN 22 (H) 08/10/2013    BUN 28 (H) 08/09/2013    Sodium 140 02/15/2024    Sodium 140 02/10/2024    Sodium 142 08/03/2023    Sodium 144 08/02/2023    Sodium 142 07/23/2023    Sodium 135 08/13/2013    Sodium 135 08/12/2013    Sodium 137 08/11/2013    Sodium 134 (L) 08/10/2013    Sodium 132 (L) 08/09/2013    Sodium Whole Blood 137 03/24/2015    Potassium 3.9 02/15/2024    Potassium 4.0 02/10/2024    Potassium 4.0 08/03/2023    Potassium 4.1 08/02/2023    Potassium 4.1 07/23/2023    Potassium 3.8 08/13/2013    Potassium 3.6 08/12/2013    Potassium 4.0 08/11/2013    Potassium 4.2 08/10/2013    Potassium 4.0 08/09/2013    Potassium, Bld 3.9 03/24/2015    Magnesium  1.9 02/15/2024    Magnesium  1.5 (L) 02/10/2024    Magnesium  1.5 (L) 12/22/2021    Magnesium  1.6 11/21/2021    Magnesium  1.6 11/14/2021    Magnesium  2.0 08/13/2013    Magnesium  2.0 08/12/2013    Magnesium  1.7 08/11/2013    Magnesium  1.7 08/10/2013    Magnesium  2.0 08/09/2013       Metric Tracker:  Did today's visit result in ED visit? No  Did today's visit result in hospital admission? No  Did today's visit result in referral to cardiology? n/a  Today's visit was a referral from [facility]     I attest that I have reviewed the Nurse Practitioner Student Luetta Estock,  RN) note and that the components of the history of the present illness, the physical exam, and the assessment and plan documented were performed by me or were performed in my presence by the student where I verified the documentation and performed (or re-performed) the exam and medical decision making. Damien MARLA [name], AGNP-C         [1]   Past Medical History:  Diagnosis Date    Abscess of lung    (CMS-HCC) 11/06/2010    CT Chest 10/22/10 12/05/2010 right thoracotomy resection of right middle lobe and resection of right lower lobe abscess     Arthritis     back    Biceps tendon tear 2013    left side    Bronchiectasis (CMS-HCC)     chronic psuedomonas infection    Degenerative joint disease of left acromioclavicular joint     GERD (gastroesophageal reflux disease)     Hypertension     Pneumonia 2012  Lung abcess     PONV (postoperative nausea and vomiting)     Prostate cancer    (CMS-HCC) 09/09/2017    Rotator cuff injury left    Vertigo    [2]   Past Surgical History:  Procedure Laterality Date    APPENDECTOMY  1969    BACK SURGERY      BRONCHOSCOPY  10/19/2010    [facility]    CARPAL TUNNEL RELEASE  1999    IR EMBOLIZATION HEMORRHAGE ART OR VEN  LYMPHATIC EXTRAVASATION  07/11/2021    IR EMBOLIZATION HEMORRHAGE ART OR VEN  LYMPHATIC EXTRAVASATION 07/11/2021 Ivonne Butler Seed, MD IMG VIR [facility]    LUNG REMOVAL, PARTIAL Right 10/2010    RML and RLL partially resected    MENISCECTOMY  2011    NECK SURGERY  1993, 1999, 2002    PR COLSC FLX W/RMVL OF TUMOR POLYP LESION SNARE TQ Left 02/08/2023    Procedure: COLONOSCOPY FLEX; W/REMOV TUMOR/LES BY SNARE;  Surgeon: Rhoderick Burnard Norris, MD;  Location: [facility];  Service: Gastroenterology    PR GERD TST W/ MUCOS IMPEDE ELECTROD,>1HR N/A 07/06/2012    Procedure: ESOPHAGEAL FUNCTION TEST, GASTROESOPHAGEAL REFLUX TEST W/ NASAL CATHETER INTRALUMINAL IMPEDANCE ELECTRODE(S) PLACEMENT, RECORDING, ANALYSIS AND INTERPRETATION; PROLONGED;  Surgeon: None None;  Location: GI PROCEDURES [facility];  Service: Gastroenterology    PR OPEN TREAT RIB FRACTURE W/INT FIXATION, UNILATERAL, 1-2 RIBS Right 03/19/2015    Procedure: OPEN TREATMENT OF RIB FRACTURE REQUIRING INTERNAL FIXATION, UNILATERAL; 1-2 RIBS;  Surgeon: Morene Nadara Diamond, MD;  Location: MAIN OR [facility];  Service: Cardiothoracic    PR THORACOTOMY W/THERAP WEDGE RESEXN ADDL IPSILATRL Right 08/07/2013    Procedure: THORACOTOMY; WITH THERAPEUTIC LOBECTOMY OF RIGHT MIDDLE AND RIGHT LOWER LOBE RESECTION , EACH ADDITIONAL RESECTION, IPSILATERAL;  Surgeon: Morene FORBES Diamond, MD;  Location: MAIN OR [place];  Service: Cardiothoracic    TONSILLECTOMY  1965   [3]   Current Outpatient Medications   Medication Sig Dispense Refill    albuterol  2.5 mg /3 mL (0.083 %) nebulizer solution INHALE 3 ML BY NEBULIZATION THREE (3) TIMES A DAY. 825 mL 3    albuterol  HFA 90 mcg/actuation inhaler Inhale 2 puffs four (4) times a day as needed. 18 g 11    alcohol  swabs  (ALCOHOL  PREP PADS) PadM Use as directed with inhaled antibiotics 100 each 5    amoxicillin -clavulanate (AUGMENTIN ) 875-125 mg per tablet Take 1 tablet by mouth two (2) times a day for 14 days. 28 tablet 0    ascorbic acid , vitamin C , (VITAMIN C ) 1000 MG tablet Take 1 tablet (1,000 mg total) by mouth daily.      atorvastatin  (LIPITOR ) 10 MG tablet Take 1 tablet (10 mg total) by mouth daily.      azithromycin  (ZITHROMAX ) 250 MG tablet TAKE 1 TABLET BY MOUTH EVERY DAY 90 tablet 3    brensocatib 25 mg tablet Take 1 tablet (25 mg total) by mouth daily. 30 tablet 11    cholecalciferol , vitamin D3-50 mcg, 2,000 unit,, 50 mcg (2,000 unit) tablet Take 1 tablet (50 mcg total) by mouth daily.      ciprofloxacin  HCl (CIPRO ) 750 MG tablet Take 1 tablet (750 mg total) by mouth two (2) times a day. 28 tablet 0    colistimethate  (COLYMYCIN) 150 mg injection Inject 2 mL sterile water  for injection to mix colistin vial, then draw up 2 mL (150mg ) and inhale 2 times a day, 28 days on and 28 days off. 60 each  5    dilTIAZem  (CARDIZEM  CD) 120 MG 24 hr capsule TAKE 1 CAPSULE BY MOUTH EVERY DAY 90 capsule 3    dornase alfa  (PULMOZYME ) 1 mg/mL nebulizer solution Inhale 1 ampule (2.5 mg) via nebulizer daily. Use at least 30-60 minutes before airway clearance, or after airway clearance. 75 mL 11    dupilumab  (DUPIXENT  SYRINGE) 200 mg/1.14 mL syringe Inject the contents of 1 syringe (200 mg total) under the skin every fourteen (14) days. 2.3 mL 11    empty container (SHARPS CONTAINER) Misc USE AS DIRECTED 1 each 0    empty container Misc USE AS DIRECTED 1 each 2    empty container Misc Use as directed to dispose of needles. When full, make sure lid is closed tightly then dispose of container in trash. 1 each 2    ensifentrine  (OHTUVAYRE ) 3 mg/2.5 mL NbSp Inhale 3 mg two (2) times a day. 75 mL 11    EPINEPHrine  (EPIPEN ) 0.3 mg/0.3 mL injection Inject 0.3 mL (0.3 mg total) into the muscle once as needed for anaphylaxis (may repeat if needed) for up to 2 doses. 2 each 0    ferrous sulfate  325 (65 FE) MG tablet Take 1 tablet (325 mg total) by mouth Two (2) times a day. BID      fluticasone  propionate (FLONASE ) 50 mcg/actuation nasal spray 1 spray into each nostril daily.      fluticasone -umeclidin-vilanter (TRELEGY ELLIPTA ) 100-62.5-25 mcg inhaler INHALE 1 PUFF DAILY 90 each 3    furosemide  (LASIX ) 40 MG tablet Take 2 tablets (80 mg total) by mouth daily. 180 tablet 0    HYDROcodone -acetaminophen  (NORCO ) 7.5-325 mg per tablet Take 1 tablet by mouth three (3) times a day (at 6am, noon and 6pm).      HYDROcodone -chlorpheniramine  polistirex (TUSSIONEX PENNKINETIC ) 10-8 mg/5 mL ER suspension Take 5 mL by mouth every twelve (12) hours as needed for cough. 115 mL 0    magnesium  oxide (MAG-OX) 400 mg (241.3 mg elemental magnesium ) tablet Take 1 tablet (400 mg total) by mouth daily. 30 tablet 11    montelukast  (SINGULAIR ) 10 mg tablet TAKE 1 TABLET BY MOUTH EVERY DAY AT NIGHT 90 tablet 3    multivitamin-minerals-lutein Tab Take 1 tablet by mouth daily.      nebulizers (LC PLUS) Misc Use with inhaled medications 1 each 5    nebulizers (LC PLUS) Misc Use as directed 4 each 3    nebulizers Misc use with nebulized medications 12 each 0    needle, disp, 21 G (BD REGULAR BEVEL NEEDLES) 21 gauge x 1 1/2 Ndle Use as directed with inhaled Colistin 100 each 3    NON FORMULARY Take 650 mg by mouth two (2) times a day. Brand: Citracal 650mg       omeprazole (PRILOSEC) 20 MG capsule Take 1 capsule (20 mg total) by mouth Two (2) times a day.      OXYGEN -AIR DELIVERY SYSTEMS MISC Inhale 2 mL.      predniSONE  (DELTASONE ) 10 MG tablet Take 40mg  (4 tabs) daily x5 day, then 30mg  (3 tabs) daily x5 day, then 20mg  (2 tabs) daily x 5 day, then 10mg  (1 tab) daily x5 day, then off 50 tablet 0    sodium chloride  (BD POSIFLUSH NORMAL SALINE 0.9) 0.9 % injection Inject 2mL of 0.9%NaCl into colistin vial & gently mix. After withdrawing colistin dose, add an additional 1mL of 0.9%NaCl to neb cup with the colistin dose. 180 mL 11    sodium chloride  10 % Nebu  Measure and Inhale 5 mL by nebulization Two (2) times a day. Discard remaining amount and use a new vial for each dose. 900 mL 3    spironolactone  (ALDACTONE ) 25 MG tablet Take 1 tablet (25 mg total) by mouth daily. 30 tablet 11    sterile water  Soln Use 2mL to mix Colistin, then add additional 2mL to neb cup with 2mL of mixed colistin. Inhale twice daily 28 days on and 28 days off. 600 mL 5    syringe with needle (BD LUER-LOK SYRINGE) 3 mL 20 gauge x 1 1/2 Syrg For use with inhaled antibiotic (Colistin) 60 each 30    syringe with needle (BD LUER-LOK SYRINGE) 3 mL 21 gauge x 1 Syrg For use with inhaled antibiotic (Colistin) 60 each 5    tobramycin , PF, ([name] ) 300 mg/5 mL nebulizer solution Inhale the contents of 1 ampule (300 mg total) by nebulization every twelve (12) hours. 28 days on and 28 days off. 280 mL 5    zinc  gluconate 50 mg (7 mg elemental zinc ) tablet Take 1 tablet (50 mg total) by mouth.      zoledronic  acid-mannitol &water  (RECLAST ) 5 mg/100 mL PgBk Infusion No current facility-administered medications for this visit.   [4]   Allergies  Allergen Reactions    Gentamicin Other (See Comments)     BALANCE ISSUES    Tobramycin  Other (See Comments)     ototoxicity    Mirtazapine  Other (See Comments)     Lower extremity swelling    Morphine Nausea And Vomiting

## 2024-02-25 NOTE — Progress Notes (Signed)
 Admission Date Requested: 02/29/24    Reason for Admission: Bronchiectasis exacerbation refractory to oral antibiotics     Antibiotics:  Based on prior culture data (OPF, PsA) and allergy profile, favor treating with:  IV Zosyn  if staying in hospital for duration. (Cost of IV ceftazidime  was too high during last admission).  Oral levofloxacin   [name]  nebs  Will need single lumen PICC line for 2-3 weeks of IV antibiotics. Duration to be determined based on clinical response and improvement in inflammatory markers.  Continue chronic azithromycin  for anti-inflammatory effect.    Other medications:  Continue home Brinsupri, Ohtuvayre   Convert Trelegy to formulary equivalent.  May change to Trelegy 200 after discharge.  Lasix  - may need up titration while on IV antibiotics due to increased fluid volume.    Airway clearance: Needs aggressive airway clearance 4 times per day.  Nebulized hypertonic saline 10%; pre-treatment with albuterol .  For mechanical clearance, prefer metaneb and Vibralung.  Pulmozyme  2.5 mg daily    Consults:  Pulmonology  Consider cardiology consult for RHC if rising Cr with diuresis.    Labs:  On admission, CBC-D, CMP, CRP, ESR, sputum for lower respiratory and AFB cultures (NTM screening). If patient is unable to spontaneously expectorate a sample, please induce with hypertonic saline.   CRP and ESR every 4-5 days to trend (including as outpatient).  [facility] Guideline for Monitoring Outpatient Intravenous and Oral Antimicrobial Therapy recommended monitoring labs ([email or web address])    Imaging:  None    Other Testing:  Patient can obtain PFTs performed while in the hospital prior to discharge.    [facility]):  Patient is not a candidate for [facility] due to distance.    Dispo:   Patient has a follow-up appointment with me scheduled fro February 24.  Patient does not need to remain in the hospital for the entire course of IV antibiotics.   Please reach out to [facility] Bronchiectasis/NTM Care and [facility] team (myself, Darice Lever RN, Joesph KYM Molt CPP) prior to discharge to ensure that home infusion orders are correct and that we are comfortable with patient discharging home on IVs at that time.  We will follow monitoring labs.  Please use DOTPULMIVDISCHARGE for home infusion orders. We will follow monitoring labs. Does not need OPAT.  As early as possible, please start group chat with pulmonology consult, primary team, case management, in-patient pharmacy, and Bronchiectasis IV Atibiotics to ensure both inpatient and outpatient teams have clear communication regarding plan for home infusion and discharge timing.     I am available to assist the primary team with his care during hospitalization.

## 2024-02-28 ENCOUNTER — Ambulatory Visit: Admit: 2024-02-28 | Discharge: 2024-02-29 | Attending: Neurology | Primary: Neurology

## 2024-02-28 DIAGNOSIS — G622 Polyneuropathy due to other toxic agents: Principal | ICD-10-CM

## 2024-02-28 DIAGNOSIS — G629 Polyneuropathy, unspecified: Principal | ICD-10-CM

## 2024-02-28 NOTE — Progress Notes (Signed)
 Outpatient Neurology Consult Note     MMNT [address]  [facility] [address] [place]  [address]  [place] [address]  [phone]    Date: 02/28/24  Patient Name: Jordan [name]   MRN: [id number]  PCP: Frederik Lamar POUR, MD    ASSESSMENT AND PLAN       Assessment & Plan  Peripheral neuropathy  Symptoms improved post-antifungal (voriconazole ) discontinuation. Intermittent toe tingling, reflexes suboptimal, improved vibration sensation. No EMG needed.  - Monitor neuropathy symptoms.    Restless legs syndrome and muscle twitching  Intermittent twitching, possibly linked to CO2 retention. No headaches.    Today, I personally spent 40 minutes with the patient, which includes all pre, intra, and post visit time on the date of service.        HISTORY     History of Present Illness  Jordan [name] is a 68 year old male with bronchiectasis who presents with worsening lung infection symptoms. He has experienced a significant worsening of his lung condition, with increased reliance on oxygen  compared to last year.    He has a history of bronchiectasis and has been dealing with lung issues since childhood. 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    Regarding his neuropathy, there has been improvement since discontinuing antifungal medication, which was suspected to be the cause. He experiences occasional tingling in the bottom of his toes, which is not consistent and does not interfere with his activities. He is not taking any medications for tingling.    He describes experiencing leg twitching while sitting, occurring in both legs without associated headaches. In May, he had significant leg swelling, for which an ultrasound and CT scan ruled out blood clots. Diuresis with Lasix  resolved the swelling, and he is currently on 60 mg of Lasix , reduced from 80 mg due to kidney function concerns.    No muscle weakness, fatigue, or double vision. He has not experienced headaches frequently in his lifetime.      Other than as noted above and in the chart, there is no change in the past medical, surgical, family, or social history since the last visit.    Past Medical History: He  has a past medical history of Abscess of lung    (CMS-HCC) (11/06/2010), Arthritis, Biceps tendon tear (2013), Bronchiectasis (CMS-HCC), Degenerative joint disease of left acromioclavicular joint, GERD (gastroesophageal reflux disease), Hypertension, Pneumonia (2012), PONV (postoperative nausea and vomiting), Prostate cancer    (CMS-HCC) (09/09/2017), Rotator cuff injury (left), and Vertigo.    Medications:   He is on the following medications: has a current medication list which includes the following prescription(s): albuterol , albuterol , alcohol  swabs , amoxicillin -clavulanate, ascorbic acid  (vitamin c ), atorvastatin , azithromycin , brensocatib, cholecalciferol  (vitamin d3-50 mcg (2,000 unit)), ciprofloxacin  hcl, colistimethate , diltiazem , dornase alfa , dupixent  syringe, empty container, empty container, empty container, ohtuvayre , epinephrine , ferrous sulfate , fluticasone  propionate, trelegy ellipta , furosemide , hydrocodone -acetaminophen , hydrocodone -chlorpheniramine  polistirex, magnesium  oxide, montelukast , multivitamin-minerals-lutein, lc plus, nebulizers, nebulizers, bd regular bevel needles, NON FORMULARY, omeprazole, oxygen -air delivery systems, prednisone , sodium chloride , sodium chloride , spironolactone , sterile water , syringe with needle, bd luer-lok syringe, tobramycin  (pf), zinc  gluconate, and zoledronic  acid-mannitol &water .    Allergies: He is allergic to gentamicin, tobramycin , mirtazapine , and morphine.    EXAMINATION     Physical Examination:    Vital Signs :Vitals signs :His height is 167.6 cm (5' 6) and weight is 82.1 kg (  181 lb). His temporal temperature is 36.8 ??C (98.2 ??F). His blood pressure is 112/64 and his pulse is 83.       Neurological examination:    Mental Status testing is normal. Language and speech are normal.    Cranial Nerve exam:    Extra ocular muscles are full, face is strong and symmetric. Mild left eye ptosis is noted.  Gag is intact. Tongue is strong. Neck flexion and extension are strong.    Motor examination: Muscle bulk and muscle tone are normal.    Strength evaluation:      Strength evaluation Upper Extremities (MRC/lbs.):        SA EF  EE  WE  WF  FF FE ThAb FAb   Right 5 5 5 5 5 5 5 5 5    Left 5 5 5 5 5 5 5 5 5    R lbs            L lbs                 Strength evaluation Lower Extremities (MRC):        HF KE KF DF PF Toe E Toe F   Right 5 5 5 5 5      Left 5 5 5 5 5      R- lbs          L- lbs              Reflexes:    Reflexes Right Left   Biceps 2+ 2+   Triceps 2+ 2+   Brachioradialis 2+ 2+   Patella 2+ 2+   Achilles 0 0   Plantar Response Flexor Flexor     Sensory Examination:  Vibration is present for 5 seconds at the great toe and normal at the MM.  Pin is reduced from tip of the toes to MTP.  Position sensation are grossly normal in the hands and feet.    Coordination and Gait:  Gait is narrow-based and steady, with normal arm swing although is difficult to test given the oxygen  cylinder and tubing attached to him.   ________________________________________________________________________________________________________________________________________________    Please note that portions of this note may be dictated using Dragon natural speaking voice recognition software.  Variances in spelling and vocabulary are possible and unintentional.

## 2024-02-29 ENCOUNTER — Inpatient Hospital Stay
Admission: EM | Admit: 2024-02-29 | Discharge: 2024-03-03 | Disposition: A | Discharge status: Home / Self Care | Payer: Medicare (Managed Care)

## 2024-02-29 LAB — COMPREHENSIVE METABOLIC PANEL
ALBUMIN: 3.9 g/dL (ref 3.4–5.0)
ALKALINE PHOSPHATASE: 80 U/L (ref 46–116)
ALT (SGPT): 14 U/L (ref 10–49)
ANION GAP: 15 mmol/L — ABNORMAL HIGH (ref 5–14)
AST (SGOT): 22 U/L (ref <=34)
BILIRUBIN TOTAL: 0.2 mg/dL — ABNORMAL LOW (ref 0.3–1.2)
BLOOD UREA NITROGEN: 18 mg/dL (ref 9–23)
BUN / CREAT RATIO: 13
CALCIUM: 9.9 mg/dL (ref 8.7–10.4)
CHLORIDE: 100 mmol/L (ref 98–107)
CO2: 25 mmol/L (ref 20.0–31.0)
CREATININE: 1.4 mg/dL — ABNORMAL HIGH (ref 0.73–1.18)
EGFR CKD-EPI (2021) MALE: 54 mL/min/1.73m2 — ABNORMAL LOW (ref >=60)
GLUCOSE RANDOM: 125 mg/dL (ref 70–179)
POTASSIUM: 4.5 mmol/L (ref 3.4–4.8)
PROTEIN TOTAL: 8.1 g/dL (ref 5.7–8.2)
SODIUM: 140 mmol/L (ref 135–145)

## 2024-02-29 LAB — CBC W/ AUTO DIFF
BASOPHILS ABSOLUTE COUNT: 0.1 10*9/L (ref 0.0–0.1)
BASOPHILS RELATIVE PERCENT: 0.9 %
EOSINOPHILS ABSOLUTE COUNT: 0.2 10*9/L (ref 0.0–0.5)
EOSINOPHILS RELATIVE PERCENT: 2.1 %
HEMATOCRIT: 42.9 % (ref 39.0–48.0)
HEMOGLOBIN: 14.6 g/dL (ref 12.9–16.5)
LYMPHOCYTES ABSOLUTE COUNT: 2 10*9/L (ref 1.1–3.6)
LYMPHOCYTES RELATIVE PERCENT: 19 %
MEAN CORPUSCULAR HEMOGLOBIN CONC: 34 g/dL (ref 32.0–36.0)
MEAN CORPUSCULAR HEMOGLOBIN: 30.7 pg (ref 25.9–32.4)
MEAN CORPUSCULAR VOLUME: 90.3 fL (ref 77.6–95.7)
MEAN PLATELET VOLUME: 8.1 fL (ref 6.8–10.7)
MONOCYTES ABSOLUTE COUNT: 0.9 10*9/L — ABNORMAL HIGH (ref 0.3–0.8)
MONOCYTES RELATIVE PERCENT: 8.7 %
NEUTROPHILS ABSOLUTE COUNT: 7.1 10*9/L (ref 1.8–7.8)
NEUTROPHILS RELATIVE PERCENT: 69.3 %
NUCLEATED RED BLOOD CELLS: 0 /100{WBCs} (ref <=4)
PLATELET COUNT: 185 10*9/L (ref 150–450)
RED BLOOD CELL COUNT: 4.75 10*12/L (ref 4.26–5.60)
RED CELL DISTRIBUTION WIDTH: 12.5 % (ref 12.2–15.2)
WBC ADJUSTED: 10.3 10*9/L (ref 3.6–11.2)

## 2024-02-29 LAB — SEDIMENTATION RATE: ERYTHROCYTE SEDIMENTATION RATE: 39 mm/h — ABNORMAL HIGH (ref 0–20)

## 2024-02-29 LAB — C-REACTIVE PROTEIN: C-REACTIVE PROTEIN: 9.2 mg/L (ref <=10.0)

## 2024-02-29 NOTE — H&P (Signed)
 [facility]   History and Physical       Assessment and Plan     Jordan [name] is a 68 y.o. male who is presenting to [facility] with Bronchiectasis (CMS-HCC), in the setting of the following pertinent/contributing co-morbidities: Heart Failure .      Bronchiectasis (CMS-HCC)   Chronic illness with severe exacerbation or progression that has a significant risk of morbidity without appropriate treatment  # Bronchiectasis with Acute Exacerbation   Patient is currently admitted for bronchiectasis exacerbation refractory to oral antibiotics, with previous exacerbations on 07/23/2023 and 11/07/2022. He has chronic Pseudomonas colonization and extensive bronchiectasis with bronchial wall thickening bilaterally (XR Chest 08/04/2023). Current medications include azithromycin , brensocatib, ciprofloxacin , colistin, dornase alfa , fluticasone /umeclidinium/vilanterol, albuterol , sodium chloride , and tobramycin .  Antibiotics:  Based on prior culture data (OPF, PsA) and allergy profile, favor treating with:  IV Zosyn  if staying in hospital for duration. (Cost of IV ceftazidime  was too high during last admission).  Oral levofloxacin   [name]  nebs  Will need single lumen PICC line for 2-3 weeks of IV antibiotics. Duration to be determined based on clinical response and improvement in inflammatory markers.  Continue chronic azithromycin  for anti-inflammatory effect.     Other medications:  Continue home Brinsupri, Ohtuvayre   Convert Trelegy to formulary equivalent.  May change to Trelegy 200 after discharge.  Lasix  - may need up titration while on IV antibiotics due to increased fluid volume.     Airway clearance: Needs aggressive airway clearance 4 times per day.  Nebulized hypertonic saline 10%; pre-treatment with albuterol .  For mechanical clearance, prefer metaneb and Vibralung.  Pulmozyme  2.5 mg daily     Consults:  Pulmonology  Consider cardiology consult for RHC if rising Cr with diuresis.     Labs:  On admission, CBC-D, CMP, CRP, ESR, sputum for lower respiratory and AFB cultures (NTM screening). If patient is unable to spontaneously expectorate a sample, please induce with hypertonic saline.   CRP and ESR every 4-5 days to trend (including as outpatient).    # Acute on Chronic Diastolic Heart Failure with Preserved Ejection Fraction   Patient was diagnosed with acute on chronic HFpEF on 02/23/2024, with LVEF visually estimated at 55% (Echocardiogram 01/17/2024). Worsening lower extremity edema and hypomagnesemia (1.5 mg/dL on 88/79/7974) led to increased furosemide  and initiation of spironolactone . Furosemide  dose was reduced on 02/28/2024 due to improved volume status and kidney function concerns. NTproBNP was 429 pg/mL on 02/23/2024. Current medications include furosemide , spironolactone , and magnesium  oxide.  - consider Cardiology consult  - Continue furosemide   - Continue spironolactone   - Continue magnesium  oxide  - Monitor volume status and electrolytes    #Peripheral neuropathy  Seen by neurology outpatient  -Continue to monitor    # Gastroesophageal Reflux Disease without esophagitis   Patient has a history of GERD without esophagitis, currently managed with omeprazole.  - Continue omeprazole    # Essential Hypertension   Patient has a history of essential hypertension, currently well-controlled with diltiazem , with a blood pressure of 112/64 mmHg on 02/28/2024.   - Continue diltiazem     Prophylaxis  -Ambulatory    Diet  -Nutrition Therapy Regular/House    Code Status / HCDM  -Full Code, Discussed with patient at the time of admission   -  HCDM (HCPOA): Jordan [name] - [id number]    Anticipated Medically Ready for Discharge: Anticipated in 5+ Days    Significant Comorbid Conditions:     -Age related debility POA requiring additional resources: DME,  PT, or OT  -Complex social situation/SDOH requiring consultation and support of Care Management     Issues Impacting Complexity of Management:  -High risk of complications from pain and/or analgesia likely to result in delirium  -The patient is at high risk of complications from Bronchiectasis    Medical Decision Making: Reviewed records from the following unique sources  Pulmonology and Neurology Clinic Notes. Discussed the patient's management and/or test interpretation with Pulmonology as summarized within this note    I personally spent greater than 75 minutes face-to-face and non-face-to-face in the care of this patient, which includes all pre, intra, and post visit time on the date of service.  All documented time was specific to the E/M visit and does not include any procedures that may have been performed.    HPI      Jordan [name] is a 68 y.o. male who is presenting to [facility] with Bronchiectasis (CMS-HCC).    The patient is a 68 year old male with a complex medical history including hypertension, GERD, osteoporosis, prostate cancer, and chronic peripheral neuropathy/lumbosacral radiculopathy. He has a significant pulmonary history of idiopathic bronchiectasis since childhood, complicated by chronic hypoxic respiratory failure, pulmonary aspergillosis, chronic pseudomonal colonization, and asthma/COPD overlap, and underwent VATS with right middle and lower lobectomy in 2015. Over the past three years, he has experienced multiple hospitalizations for bronchiectasis exacerbations requiring various IV antibiotics and adjustments to respiratory medications, including dupilumab . Recent cardiovascular management includes spironolactone  for heart failure, furosemide  dose adjustments due to kidney function, and an echocardiogram showing normal left ventricular systolic function with mild aortic regurgitation. He also receives zoledronic  acid for osteoporosis.     The patient presented for an outpatient neurology consult on 02/28/2024, reporting worsening lung infection symptoms and increased oxygen  reliance. His peripheral neuropathy symptoms have improved since discontinuing voriconazole , with only intermittent, non-disabling toe tingling. He experiences intermittent bilateral leg twitching, potentially linked to CO2 retention, and had significant leg swelling in May, which resolved with diuresis; furosemide  was recently reduced due to kidney concerns.    Given his worsened symptoms reported he was scheduled as a direct admit on 02/29/2024 for a bronchiectasis exacerbation, requiring treatment with IV piperacillin /tazobactam, oral levofloxacin , and nebulized tobramycin .         Med Rec Confidence   I reviewed the Medication List. The current list is Accurate    Physical Exam   Temp:  [35.7 ??C (96.2 ??F)] 35.7 ??C (96.2 ??F)  Pulse:  [97-99] 97  Resp:  [18] 18  BP: (114)/(69) 114/69  FiO2 (%):  [36 %] 36 %  SpO2:  [93 %] 93 %  Body mass index is 29.09 kg/m??.  GEN: No acute distress  HEENT: EOMI, PERRLA  Cardiac: RRR, no murmurs appreciated  Pulm: Poor inspiratory and expiatory effort, crackles and coarse breath sounds bilaterally  ABD: Soft Nondistended nontender  MSK: ROM normal, Strength grossly intact, upper and lower extremities  SKIN: Warm, dry, no obvious rashes  Neuro: AnO x4 CN II-XII grossly intact, no focal deficits  Psych: Appropriate Affect

## 2024-02-29 NOTE — Plan of Care (Signed)
 Shift Summary  Albuterol  was administered for wheezing, and pain was managed with PRN oxyCODONE  during the shift.   Laboratory tests were completed, including CBC, metabolic panel, CRP, and sedimentation rate, providing updated information on inflammatory and renal status.   Fall reduction and safety interventions were maintained, with regular checks and appropriate positioning documented throughout the evening.   Vaccination status for pneumonia and influenza was confirmed, and no new symptoms of TB were reported.   Overall, vital signs and safety measures remained stable during the shift.    Absence of Hospital-Acquired Illness or Injury: No new injuries or hospital-acquired conditions were documented during the shift, and fall reduction interventions were consistently maintained with regular safety checks and appropriate positioning in the chair.    Absence of Infection Signs and Symptoms: Sputum remained green and inflammatory markers such as CRP and sedimentation rate were elevated, but temperature stayed within normal limits and TB symptoms were denied; vaccines for pneumonia and influenza were confirmed as received earlier in the evening.    Blood Pressure in Desired Range: Mean arterial pressure was stable at 90 mmHg throughout the shift, with no significant fluctuations noted.

## 2024-03-01 LAB — CBC W/ AUTO DIFF
BASOPHILS ABSOLUTE COUNT: 0.1 10*9/L (ref 0.0–0.1)
BASOPHILS RELATIVE PERCENT: 1 %
EOSINOPHILS ABSOLUTE COUNT: 0.2 10*9/L (ref 0.0–0.5)
EOSINOPHILS RELATIVE PERCENT: 3.7 %
HEMATOCRIT: 37.9 % — ABNORMAL LOW (ref 39.0–48.0)
HEMOGLOBIN: 12.9 g/dL (ref 12.9–16.5)
LYMPHOCYTES ABSOLUTE COUNT: 1.4 10*9/L (ref 1.1–3.6)
LYMPHOCYTES RELATIVE PERCENT: 24 %
MEAN CORPUSCULAR HEMOGLOBIN CONC: 33.9 g/dL (ref 32.0–36.0)
MEAN CORPUSCULAR HEMOGLOBIN: 30.6 pg (ref 25.9–32.4)
MEAN CORPUSCULAR VOLUME: 90.3 fL (ref 77.6–95.7)
MEAN PLATELET VOLUME: 8.2 fL (ref 6.8–10.7)
MONOCYTES ABSOLUTE COUNT: 0.6 10*9/L (ref 0.3–0.8)
MONOCYTES RELATIVE PERCENT: 11.2 %
NEUTROPHILS ABSOLUTE COUNT: 3.5 10*9/L (ref 1.8–7.8)
NEUTROPHILS RELATIVE PERCENT: 60.1 %
NUCLEATED RED BLOOD CELLS: 0 /100{WBCs} (ref <=4)
PLATELET COUNT: 156 10*9/L (ref 150–450)
RED BLOOD CELL COUNT: 4.2 10*12/L — ABNORMAL LOW (ref 4.26–5.60)
RED CELL DISTRIBUTION WIDTH: 12.3 % (ref 12.2–15.2)
WBC ADJUSTED: 5.8 10*9/L (ref 3.6–11.2)

## 2024-03-01 LAB — COMPREHENSIVE METABOLIC PANEL
ALBUMIN: 3.3 g/dL — ABNORMAL LOW (ref 3.4–5.0)
ALKALINE PHOSPHATASE: 66 U/L (ref 46–116)
ALT (SGPT): 12 U/L (ref 10–49)
ANION GAP: 14 mmol/L (ref 5–14)
AST (SGOT): 15 U/L (ref <=34)
BILIRUBIN TOTAL: 0.3 mg/dL (ref 0.3–1.2)
BLOOD UREA NITROGEN: 25 mg/dL — ABNORMAL HIGH (ref 9–23)
BUN / CREAT RATIO: 18
CALCIUM: 9.4 mg/dL (ref 8.7–10.4)
CHLORIDE: 98 mmol/L (ref 98–107)
CO2: 30.3 mmol/L (ref 20.0–31.0)
CREATININE: 1.36 mg/dL — ABNORMAL HIGH (ref 0.73–1.18)
EGFR CKD-EPI (2021) MALE: 56 mL/min/1.73m2 — ABNORMAL LOW (ref >=60)
GLUCOSE RANDOM: 88 mg/dL (ref 70–179)
POTASSIUM: 4.3 mmol/L (ref 3.4–4.8)
PROTEIN TOTAL: 6.8 g/dL (ref 5.7–8.2)
SODIUM: 142 mmol/L (ref 135–145)

## 2024-03-01 MED ADMIN — HYDROcodone-acetaminophen (NORCO) 5-325 mg per tablet 1 tablet: 1 | ORAL | @ 18:00:00 | Stop: 2024-03-08

## 2024-03-01 MED ADMIN — HYDROcodone-acetaminophen (NORCO) 5-325 mg per tablet 1 tablet: 1 | ORAL | @ 23:00:00 | Stop: 2024-03-08

## 2024-03-01 MED ADMIN — tobramycin (PF) (TOBI) 300 mg/5 mL nebulizer solution 300 mg: 300 mg | RESPIRATORY_TRACT | @ 17:00:00 | Stop: 2024-03-14

## 2024-03-01 MED ADMIN — albuterol 2.5 mg /3 mL (0.083 %) nebulizer solution 2.5 mg: 2.5 mg | RESPIRATORY_TRACT | @ 19:00:00

## 2024-03-01 MED ADMIN — piperacillin-tazobactam (ZOSYN) IVPB (premix) 4.5 g: 4.5 g | INTRAVENOUS | @ 21:00:00 | Stop: 2024-03-14

## 2024-03-01 MED ADMIN — piperacillin-tazobactam (ZOSYN) IVPB (premix) 4.5 g: 4.5 g | INTRAVENOUS | @ 15:00:00 | Stop: 2024-03-14

## 2024-03-01 MED ADMIN — piperacillin-tazobactam (ZOSYN) IVPB (premix) 4.5 g: 4.5 g | INTRAVENOUS | @ 09:00:00 | Stop: 2024-03-14

## 2024-03-01 MED ADMIN — piperacillin-tazobactam (ZOSYN) IVPB (premix) 4.5 g: 4.5 g | INTRAVENOUS | @ 04:00:00 | Stop: 2024-03-14

## 2024-03-01 MED ADMIN — spironolactone (ALDACTONE) tablet 25 mg: 25 mg | ORAL | @ 14:00:00

## 2024-03-01 MED ADMIN — ferrous sulfate tablet 325 mg: 325 mg | ORAL | @ 17:00:00

## 2024-03-01 MED ADMIN — levoFLOXacin (LEVAQUIN) tablet 750 mg: 750 mg | ORAL | @ 11:00:00 | Stop: 2024-03-15

## 2024-03-01 MED ADMIN — cholecalciferol (vitamin D3 25 mcg (1,000 units)) tablet 50 mcg: 50 ug | ORAL | @ 17:00:00

## 2024-03-01 MED ADMIN — sodium chloride 10 % NEBULIZER solution 5 mL: 5 mL | RESPIRATORY_TRACT | @ 13:00:00

## 2024-03-01 MED ADMIN — fluticasone propionate (FLONASE) 50 mcg/actuation nasal spray 1 spray: 1 | NASAL | @ 17:00:00

## 2024-03-01 MED ADMIN — magnesium oxide (MAG-OX) tablet 400 mg: 400 mg | ORAL | @ 14:00:00 | Stop: 2024-03-01

## 2024-03-01 MED ADMIN — albuterol 2.5 mg /3 mL (0.083 %) nebulizer solution 2.5 mg: 2.5 mg | RESPIRATORY_TRACT | @ 02:00:00

## 2024-03-01 MED ADMIN — pantoprazole (Protonix) EC tablet 20 mg: 20 mg | ORAL | @ 23:00:00

## 2024-03-01 MED ADMIN — brensocatib tablet 25 mg ** PATIENT SUPPLIED**: 25 mg | ORAL | @ 19:00:00

## 2024-03-01 MED ADMIN — oxyCODONE (ROXICODONE) immediate release tablet 10 mg: 10 mg | ORAL | @ 11:00:00 | Stop: 2024-03-01

## 2024-03-01 MED ADMIN — oxyCODONE (ROXICODONE) immediate release tablet 10 mg: 10 mg | ORAL | @ 04:00:00 | Stop: 2024-03-07

## 2024-03-01 MED ADMIN — azithromycin (ZITHROMAX) tablet 250 mg: 250 mg | ORAL | @ 14:00:00 | Stop: 2024-03-15

## 2024-03-01 MED ADMIN — furosemide (LASIX) tablet 40 mg: 40 mg | ORAL | @ 14:00:00 | Stop: 2024-03-01

## 2024-03-01 MED ADMIN — fluticasone furoate-vilanterol (BREO ELLIPTA) 200-25 mcg/dose inhaler 1 puff: 1 | RESPIRATORY_TRACT | @ 14:00:00

## 2024-03-01 MED ADMIN — non formulary: 3 mg | RESPIRATORY_TRACT | @ 13:00:00 | Stop: 2024-03-01

## 2024-03-01 MED ADMIN — multivitamins, therapeutic with minerals tablet 1 tablet: 1 | ORAL | @ 17:00:00

## 2024-03-01 MED ADMIN — sodium chloride 10 % NEBULIZER solution 5 mL: 5 mL | RESPIRATORY_TRACT | @ 02:00:00

## 2024-03-01 MED ADMIN — dornase alfa (PULMOZYME) 1 mg/mL solution 2.5 mg: 2.5 mg | RESPIRATORY_TRACT | @ 13:00:00

## 2024-03-01 MED ADMIN — umeclidinium (INCRUSE ELLIPTA) 62.5 mcg/actuation inhaler 1 puff: 1 | RESPIRATORY_TRACT | @ 14:00:00

## 2024-03-01 MED ADMIN — ascorbic acid (vitamin C) (VITAMIN C) tablet 1,000 mg: 1000 mg | ORAL | @ 17:00:00

## 2024-03-01 MED ADMIN — atorvastatin (LIPITOR) tablet 10 mg: 10 mg | ORAL | @ 14:00:00

## 2024-03-01 MED ADMIN — albuterol 2.5 mg /3 mL (0.083 %) nebulizer solution 2.5 mg: 2.5 mg | RESPIRATORY_TRACT | @ 13:00:00 | Stop: 2024-03-01

## 2024-03-01 NOTE — Consults (Signed)
 Clinical Pharmacy Non-Formulary Home Medication Evaluation Note     Team is requesting continuation of the patient's non-formulary home medication ensifentrine . Per PolicyStat ID: [id number] (Medication Management: Patient-Supplied Medications,) the continued use of these medications during hospital admission was evaluated by pharmacy.      Recommendation: Continuation of non-formulary home medication ensifentrine  is clinically necessary and will be identified by pharmacy for continuation. Per policy, patient-supplied medications must be supplied by the patient for the duration of the encounter.      Rationale: This medication was approved for inpatient use by Eva Minerva (clinical pharmacist). On each encounter, clinical appropriateness will be evaluated.    This information has been communicated to the ordering clinician. Pharmacy will continue to follow patient with team. Please page the service pharmacist pager found in the [facility] Directory if further immediate assistance is required.     Thank you,     Eva JONELLE Minerva, PharmD

## 2024-03-01 NOTE — Consults (Signed)
 General Pulmonary Team Initial Consult Note     Date of Service: 03/01/2024  Requesting Physician: Chiquita Almarie Sora, MD   Requesting Service: Med Undesignated (MDX)  Reason for consultation: Comprehensive evaluation of Bronchiectasis exacerbation.    Hospital Problems:  Principal Problem:    Bronchiectasis (CMS-HCC)  Active Problems:    Pseudomonas aeruginosa infection    Osteoarthrosis    IDA (iron deficiency anemia)    Dyspnea on exertion    Chronic hypoxemic respiratory failure    (CMS-HCC)    Toxic neuropathy (HHS-HCC)      HPI: Jordan [name] is a 68 y.o. male with bronchiectasis secondary to unknown cause (idiopathic) with Pseudomonas colonization, chronic hypoxemic respiratory failure, and asthma/COPD overlap. He presents for admission after failing to improve with outpatient oral antibiotics with increased sputum, hypoxia and fatigue.  Admitted for IV antibiotics for bronchiectasis exacerbation. Follows closely in our Bronchiectasis clinic with Dr. Blondie.    Assessment     Interval Events: None       Impression: The patient has acute illness with systemic symptoms. Is being treated for acute bronchiectasis exacerbation with increased O2 needs.     Problems addressed during this consult include acute on chronic respiratory failure and bronchiectasis.       Recommendations     Reason for Admission: Bronchiectasis exacerbation refractory to oral antibiotics      Antibiotics:  Based on prior culture data (OPF, PsA) and allergy profile, favor treating with:  IV Zosyn  if staying in hospital for duration. (Cost of IV ceftazidime  was too high during last admission).  Oral levofloxacin   [name]  nebs  Will need single lumen PICC line for 2-3 weeks of IV antibiotics. Duration to be determined based on clinical response and improvement in inflammatory markers.   Continue chronic azithromycin  for anti-inflammatory effect.     Other medications:  Continue home Brinsupri, Ohtuvayre   Convert Trelegy to formulary equivalent.  May change to Trelegy 200 after discharge.  Lasix  - may need up titration while on IV antibiotics due to increased fluid volume, does have a bit of a bump in Cr from recent use so monitoring     Airway clearance: Needs aggressive airway clearance 4 times per day.  Nebulized hypertonic saline 10%; pre-treatment with albuterol .  For mechanical clearance, prefer metaneb and Vibralung.  Pulmozyme  2.5 mg daily        Labs:  CRP normal at admission, SED elevated at 39. WBC normal. Sputum culture pending.BMP with Cr 1.36 (stable from last few weeks with diuresis but up from prior baseline) Electrolytes ok.   CRP and ESR every 4-5 days to trend (including as outpatient).       Dispo:   Patient has a follow-up appointment with Dr. Blondie scheduled fro February 24.  Patient does not need to remain in the hospital for the entire course of IV antibiotics but can get PFTs prior to discharge.   Please reach out to [facility] Bronchiectasis/[facility]M [facility] and [facility] team (Dr. Blondie, Darice Lever RN, Joesph KYM Molt CPP) prior to discharge to ensure that home infusion orders are correct and that we are comfortable with patient discharging home on IVs at that time.  They will follow monitoring labs.  Please use [email or web address] for home infusion orders. Bronchiectasis clinic will follow monitoring labs. Does not need OPAT.       Please do not hesitate to page [phone] [facility] consult) with questions. We appreciate the opportunity to assist in the care of this patient. The  recommendations outlined in this note were discussed w the primary team via epic chat. We look forward to following with you.     [name], MD      Subjective & Objective     Subjective: PER HPI         Vitals - past 24 hours  Temp:  [35.7 ??C (96.2 ??F)-36.4 ??C (97.5 ??F)] 36.3 ??C (97.4 ??F)  Pulse:  [65-99] 65  Resp:  [18] 18  BP: (111-128)/(59-69) 111/59  FiO2 (%):  [36 %] 36 %  SpO2:  [93 %-98 %] 98 % Intake/Output  No intake/output data recorded.        Pertinent exam findings:   General appearance - NAD, fatigued, no increased WOB at rest  Heart - normal rate and regular rhythm  Chest - bilateral coarse BS at mid to bases. No wheeze    Extremities - mild LE edema      Relevant Imaging Data   - CXR on 07/2023: extensive bilateral bronchiectasis and right apical pleural thickening with scarring/volume los - chronic  - echo 12/16/2023: very difficult study per read, LVEF 55% and right ventricle appears normal    Arterial Blood Gas:   No results for input(s): SPECTYPEART, PHART, PCO2ART, PO2ART, HCO3ART, BEART, O2SATART in the last 24 hours.     Venous Blood Gas:   No results for input(s): PHVEN, PCO2VEN, PO2VEN, HCO3VEN, BEVEN, O2SATVEN in the last 24 hours.     Cultures:  Lower Respiratory Culture (no units)   Date Value   02/11/2024 OROPHARYNGEAL FLORA ISOLATED     WBC (10*9/L)   Date Value   03/01/2024 5.8          Other Labs:  Lab Results   Component Value Date    WBC 5.8 03/01/2024    HGB 12.9 03/01/2024    HCT 37.9 (L) 03/01/2024    PLT 156 03/01/2024     Lab Results   Component Value Date    NA 142 03/01/2024    K 4.3 03/01/2024    CL 98 03/01/2024    CO2 30.3 03/01/2024    BUN 25 (H) 03/01/2024    CREATININE 1.36 (H) 03/01/2024    GLU 88 03/01/2024    CALCIUM  9.4 03/01/2024    MG 1.8 02/23/2024    PHOS 3.7 01/22/2016     Lab Results   Component Value Date    BILITOT 0.3 03/01/2024    BILIDIR 0.40 04/02/2015    PROT 6.8 03/01/2024    ALBUMIN 3.3 (L) 03/01/2024    ALT 12 03/01/2024    AST 15 03/01/2024    ALKPHOS 66 03/01/2024    GGT 64 06/21/2013     Lab Results   Component Value Date    LABPROT 10.4 08/03/2013    INR 0.95 04/14/2017    APTT 32.6 07/25/2014       Allergies & Home Medications   Personally reviewed in Epic    Continuous Infusions:   Infusions Meds[1]    Scheduled Medications:   Scheduled Medications[2]    PRN medications:  PRN Medications[3]             Medical Decision Making on 03/01/2024     External Notes:    I reviewed the following: Progress note(s) , Consultant note(s) , Nursing note(s), Therapist note(s), and H&P note(s)    Results:   I reviewed the following labs/reports for this consult - CBC unremarkable or unchanged from prior., chemistry abnormal d/t elevated but  stable Cr., and microbiology abnormal d/t prior pseudomonas.    Tests Ordered:   See recommendations above    Historian: no independent historian required    Imaging: I independently viewed and interpreted the following studies for this consult - CXR images        Risks of management:    - Management of airway clearance devices/medications.  - Drug therapy requiring intensive monitoring for toxicity.                       [1] [2]    albuterol   2.5 mg Nebulization BID (RT)    atorvastatin   10 mg Oral Daily    azithromycin   250 mg Oral Q24H SCH    dilTIAZem   120 mg Oral Daily    dornase alfa   2.5 mg Inhalation Daily (RT)    fluticasone  furoate-vilanterol  1 puff Inhalation Daily (RT)    And    umeclidinium  1 puff Inhalation Daily (RT)    furosemide   40 mg Oral Daily    levoFLOXacin   750 mg Oral daily    magnesium  oxide  400 mg Oral Once    montelukast   10 mg Oral Nightly    non formulary  25 mg Oral daily    non formulary  3 mg Nebulization BID (RT)    piperacillin -tazobactam (ZOSYN ) IV (intermittent)  4.5 g Intravenous Q6H    sodium chloride   5 mL Nebulization BID (RT)    spironolactone   25 mg Oral Daily    [Provider Hold] tobramycin  (PF)  300 mg Nebulization BID (RT)   [3] acetaminophen , albuterol , melatonin, oxyCODONE , senna **OR** polyethylene glycol

## 2024-03-01 NOTE — Plan of Care (Signed)
 Shift Summary  PRN oxyCODONE  was administered for ongoing back pain, but pain level remained unchanged at 6/10.   Fall risk interventions, including scheduled toileting and bed safety, were consistently implemented.   Patient maintained independence with mobility and hygiene throughout the shift.   Vitals, including temperature and MAP, remained stable and within normal limits.   Overall, the shift was stable with no new complications or hospital-acquired issues documented.     Absence of Hospital-Acquired Illness or Injury: No new hospital-acquired injuries or complications were documented during the shift, and fall prevention interventions such as scheduled toileting and bed safety were consistently maintained.     Optimal Comfort and Wellbeing: Comfort interventions included pain management with PRN oxyCODONE  and independent mobility and hygiene were maintained throughout the shift.     Absence of Infection Signs and Symptoms: Temperature remained within normal limits and SOFA scores were stable at 1 throughout the shift.     Blood Pressure in Desired Range: MAP remained at 80 mmHg, within the desired range for this patient.     Optimal Pain Control and Function: Back pain described as aching and constant was reported at a 6/10, with no change in pain level after PRN oxyCODONE  administration.

## 2024-03-01 NOTE — Consults (Signed)
 Care Management  Initial Transition Planning Assessment              General  Care Manager / Social Worker assessed the patient by : In person interview with patient, Discussion with Clinical Care team, Medical record review  Orientation Level: Oriented X4  Functional level prior to admission: Independent  Reason for referral: Discharge Planning    Contact/Decision Maker  Extended Emergency Contact Information  Primary Emergency Contact: Renie Sober  Address: [address]           [place], [place] [address] [place]  Home Phone: [phone]  Mobile Phone: [phone]  Relation: Spouse    Legal Next of Kin / Guardian / POA / Advance Directives     HCDM (HCPOA): Harvy, Riera - Spouse - [phone]    Advance Directive (Medical Treatment)  Does patient have an advance directive covering medical treatment?: Patient has advance directive covering medical treatment, copy in chart.    Health Care Decision Maker [HCDM] (Medical & Mental Health Treatment)  Healthcare Decision Maker: HCDM documented in the HCDM/Contact Info section.  Information offered on HCDM, Medical & Mental Health advance directives:: Patient declined information.    Advance Directive (Mental Health Treatment)  Does patient have an advance directive covering mental health treatment?: Patient does not have advance directive covering mental health treatment.  Reason patient does not have an advance directive covering mental health treatment:: Patient does not wish to complete one at this time.    Readmission Information    Have you been hospitalized in the last 30 days?: No      Patient Information  Lives with: Spouse/significant other    Type of Residence: Private residence        Location/Detail: [address] [place] [address] Home Phone:  [phone]    Support Systems/Concerns: Spouse    Responsibilities/Dependents at home?: No    Home Care services in place prior to admission?: No          Outpatient/Community Resources in place prior to admission: Clinic  Agency detail (Name/Phone #): PCP Lamar Ng    Equipment Currently Used at Home: respiratory supplies, oxygen   Current HME Agency (Name/Phone #): nebulizer, Vest,  O2 uses 2-4lpm cont. [employer] [facility]    Currently receiving outpatient dialysis?: No       Financial Information       Need for financial assistance?: No       Social Drivers of Health  Social Drivers of Health were addressed in provider documentation.  Please refer to patient history.  Social Drivers of Health     Food Insecurity: No Food Insecurity (03/01/2024)    Hunger Vital Sign     Worried About Running Out of Food in the Last Year: Never true     Ran Out of Food in the Last Year: Never true   Tobacco Use: Low Risk (02/29/2024)    Patient History     Smoking Tobacco Use: Never     Smokeless Tobacco Use: Never     Passive Exposure: Not on file   Transportation Needs: No Transportation Needs (03/01/2024)    PRAPARE - Transportation     Lack of Transportation (Medical): No     Lack of Transportation (Non-Medical): No   Alcohol  Use: Not on file   Housing: Low Risk (03/01/2024)    Housing     Within the past 12 months, have you ever stayed: outside, in a car, in a tent, in an overnight shelter, or  temporarily in someone else's home (i.e. couch-surfing)?: No     Are you worried about losing your housing?: No   Physical Activity: Not on file   Utilities: Low Risk (11/09/2022)    Utilities     Within the past 12 months, have you been unable to get utilities (heat, electricity) when it was really needed?: No   Stress: Not on file   Interpersonal Safety: Not At Risk (02/29/2024)    Interpersonal Safety     Unsafe Where You Currently Live: No     Physically Hurt by Anyone: No     Abused by Anyone: No   Substance Use: Not on file (01/25/2023)   Intimate Partner Violence: Patient Unable To Answer (02/23/2024)    Humiliation, Afraid, Rape, and Kick questionnaire     Fear of Current or Ex-Partner: Patient unable to answer Emotionally Abused: Patient unable to answer     Physically Abused: Patient unable to answer     Sexually Abused: Patient unable to answer   Social Connections: Not on file   Financial Resource Strain: Low Risk (03/01/2024)    Overall Financial Resource Strain (CARDIA)     Difficulty of Paying Living Expenses: Not hard at all   Health Literacy: Not on file   Internet Connectivity: Not on file       Complex Discharge Information    Is patient identified as a difficult/complex discharge?: No    Discharge Needs Assessment  Concerns to be Addressed: discharge planning, medication    Clinical Risk Factors: > 65, New Diagnosis, Multiple Diagnoses (Chronic), Functional Limitations    Barriers to taking medications: No    Prior overnight hospital stay or ED visit in last 90 days: No    Anticipated Changes Related to Illness: none    Equipment Needed After Discharge: none    Discharge Facility/Level of Care Needs: other (see comments) (home infusion)    Readmission  Risk of Unplanned Readmission Score: UNPLANNED READMISSION SCORE: 15.09%  Predictive Model Details          15% (Medium)  Factor Value    Calculated 03/01/2024 16:05 40% Number of active inpatient medication orders 49    [place] Risk of Unplanned Readmission Model 9% Latest BUN high (25 mg/dL)     9% Encounter of ten days or longer in last year present     8% Imaging order present in last 6 months     6% Age 17     6% Number of hospitalizations in last year 1     5% Diagnosis of deficiency anemia present     5% Active anticoagulant inpatient medication order present     5% Latest creatinine high (1.36 mg/dL)     3% Charlson Comorbidity Index 2     2% Future appointment scheduled     1% Active ulcer inpatient medication order present     1% Current length of stay 0.934 days      Readmitted Within the Last 30 Days? (No if blank)   Patient at risk for readmission?: Yes    Discharge Plan  Screen findings are: Discharge planning needs identified or anticipated (Comment). (pt will dc home with home infusion)    Expected Discharge Date: 03/03/2024    Expected Transfer from Critical Care:      Quality data for continuing care services shared with patient and/or representative?: Yes  Patient and/or family were provided with choice of facilities / services that are available and appropriate to meet post hospital  care needs?: Yes   List choices in order highest to lowest preferred, if applicable. : stay with the same agency used when he dc'd on last admission. Verified it was [facility] that coordinated needed services for infusion, and [facility] was secured for nursing.    Initial Assessment complete?: Yes

## 2024-03-01 NOTE — Plan of Care (Signed)
 Shift Summary  Multiple respiratory medications and chest physiotherapy were administered, with good tolerance and low pain levels throughout the shift.   Temperature stayed within normal limits and AFB smear was negative for acid-fast bacilli.   No abnormal findings were documented in IV site or wound assessments.   Overall, comfort and respiratory status were supported with ongoing interventions and stable pain control.     Optimal Comfort and Wellbeing: Respiratory medications including albuterol , dornase alfa , fluticasone  furoate-vilanterol, umeclidinium, and brensocatib were administered during the shift, and chest physiotherapy was tolerated well; oxygen  therapy was provided and pain remained low at 2/10 in the lower body, with no intervention requested.     Absence of Infection Signs and Symptoms: Temperature remained within normal limits and AFB smear was negative for acid-fast bacilli; no abnormal findings were documented in the wound or IV site assessments.     Optimal Pain Control and Function: Pain was consistently low at 2/10 in the lower body, and scheduled pain medication administration was continued.

## 2024-03-01 NOTE — Procedures (Signed)
 PICC LINE TRIAGE NOTE     VIR has received an order for PICC placement.  This patient has been triaged and  GFR is below allowable  trending from 52-56. The team should consider a temp line or have patient cleared for PICC placement before VIR will attempt bedside placement as schedule permits .     See PICC Specific History Flowsheet for additional details.     Thank You,     Alm Boon, RN  [facility] VIR   [phone]

## 2024-03-01 NOTE — Progress Notes (Signed)
 Daily Progress Note    Assessment/Plan:    Principal Problem:    Bronchiectasis (CMS-HCC)  Active Problems:    Pseudomonas aeruginosa infection    Osteoarthrosis    IDA (iron deficiency anemia)    Dyspnea on exertion    Chronic hypoxemic respiratory failure    (CMS-HCC)    Toxic neuropathy (HHS-HCC)                 Jordan [name] is a 68 y.o. male with who presented to [facility] for bronchiectasis exacerbation.     Bronchiectasis with acute exacerbation - Chronic hypoxemia needing supplemental O2 - Asthma/COPD overlap  Directly admitted per Pulmonologist (Dr. Blondie) for bronchiectasis exacerbation refractory to PO antibiotics. Prior respiratory cultures with Pseudomonas colonization. On home 2-3 L (usually 2), now on 4L.   - Consulting Pulmonology inpatient  - Follow up bacterial and AFB cultures from 12/9 sputum samples  - Antibiotics:  - IV Zosyn   - PO levofloxacin  750 mg daily  - Tobramycin  nebs   - Continue home PO azithromycin  250 mg daily   - Anticipate need for single lumen PICC for 2-3 weeks of abx (order has been placed)   - His renal function is borderline, will discuss further with PICC team  - Continue home Brinsupri tablet, Ohtuvayre  nebs   - Have placed non-formulary request with pharmacy  - Continue home montelukast  10 mg daily   - Converted Trelegy to formulary equivalent (plan to change to Trelegy 200 after discharge)  - Continue home diuretics as discussed below   - Airway clearance:   - Prefer Metaneb and Vibralung qid with RT   - Pulmozyme  2.5 mg daily   - Nebulized hypertonic saline (10%) bid with pre-treatment with albuterol   - Trend CRP/ESR every 4-5 days   - CRP 9.2, sed rate 39 on 12/9  - Plan for PFTs inpatient prior to discharge    Chronic pain - Peripheral neuropathy  On home Norco  7.5 mg-325 mg tid (scheduled). Continued home dosing here.    Acute on chronic diastolic heart failure with preserved EF - Hypertension   Last TTE 01/17/24 with EF ~55%, normal RV function (poor windows). He recently had  worsening lower extremity edema and hypomagnesemia, which led to increased furosemide  (to 80 mg daily, then 60 mg for worse renal function) and initiation of spironolactone  outpatient.   - Continue PO furosemide  60 mg daily   - Continue PO spironolactone  25 mg daily   - If Cr rises with diuresis, would consider Cardiology consult for RHC (per Dr. Blondie)  - Strict I&Os, daily weights   - Continue home magnesium  oxide  - Continue home diltiazem  ER 120 mg daily   - ASCVD risk reduction: Continue home statin     AKI  Recent Cr baseline of ~0.9 - 1.1, with Cr up to 1.46 on 12/03. Now 1.36.  - Continue daily BMP monitoring.     GERD  On home PPI, continued.    Osteoporosis  On zolendronic acid outpatient. Followed by Endocrinology.   - Endocrinology recently requested vit D check, ordered  - Continue home Vit D      DVT ppx: Lovenox   Diet: Regular     I personally spent 55 minutes face-to-face and non-face-to-face in the care of this patient, which includes all pre, intra, and post visit E/M time on the date of service. All documented time was specific to the E/M visit and does not include any pre, intra, post procedure related time.  ___________________________________________________________________    Subjective:  He is having the usual amount of coughing/sputum production, but his breathing is certainly worse than baseline/he feels more short of breath.     We re-reviewed his home medications in detail/I completed his med rec this am.     He reports mild leg swelling, overall relatively well controlled. Denies any GI issues like constipation or diarrhea. Tolerating PO.       Labs/Studies:  Labs and Studies from the last 24hrs per EMR and Reviewed    Objective:  Temp:  [35.7 ??C (96.2 ??F)-36.4 ??C (97.5 ??F)] 36.3 ??C (97.4 ??F)  Pulse:  [65-99] 65  Resp:  [18] 18  BP: (111-128)/(59-69) 111/59  FiO2 (%):  [36 %] 36 %  SpO2:  [93 %-98 %] 98 %    General:  Sitting up in chair, in NAD  HEENT: Atraumatic, 4L via [place]  Heart:  Regular rate and rhythm  Lungs:  Scattered coarse breath sounds  Extremities:  Warm, trace peripheral edema.  Psychiatric:  Normal mood and affect, intact judgment and insight, conversational

## 2024-03-01 NOTE — Consults (Signed)
 Clinical Pharmacy Non-Formulary Home Medication Evaluation Note     Team is requesting continuation of the patient's non-formulary home medication brensocatib. Per PolicyStat ID: [id number] (Medication Management: Patient-Supplied Medications,) the continued use of these medications during hospital admission was evaluated by pharmacy.      Recommendation: Continuation of non-formulary home medication brensocatib is clinically necessary and will be identified by pharmacy for continuation. Per policy, patient-supplied medications must be supplied by the patient for the duration of the encounter.      Rationale: This medication was approved for inpatient use by Eva Minerva (clinical pharmacist). On each encounter, clinical appropriateness will be evaluated.    This information has been communicated to the ordering clinician. Pharmacy will continue to follow patient with team. Please page the service pharmacist pager found in the [facility] Directory if further immediate assistance is required.     Thank you,     Eva JONELLE Minerva, PharmD

## 2024-03-02 LAB — CBC W/ AUTO DIFF
BASOPHILS ABSOLUTE COUNT: 0 10*9/L (ref 0.0–0.1)
BASOPHILS RELATIVE PERCENT: 1.1 %
EOSINOPHILS ABSOLUTE COUNT: 0.2 10*9/L (ref 0.0–0.5)
EOSINOPHILS RELATIVE PERCENT: 5.6 %
HEMATOCRIT: 37.1 % — ABNORMAL LOW (ref 39.0–48.0)
HEMOGLOBIN: 12.5 g/dL — ABNORMAL LOW (ref 12.9–16.5)
LYMPHOCYTES ABSOLUTE COUNT: 0.9 10*9/L — ABNORMAL LOW (ref 1.1–3.6)
LYMPHOCYTES RELATIVE PERCENT: 22.3 %
MEAN CORPUSCULAR HEMOGLOBIN CONC: 33.8 g/dL (ref 32.0–36.0)
MEAN CORPUSCULAR HEMOGLOBIN: 30.6 pg (ref 25.9–32.4)
MEAN CORPUSCULAR VOLUME: 90.7 fL (ref 77.6–95.7)
MEAN PLATELET VOLUME: 7.9 fL (ref 6.8–10.7)
MONOCYTES ABSOLUTE COUNT: 0.6 10*9/L (ref 0.3–0.8)
MONOCYTES RELATIVE PERCENT: 14 %
NEUTROPHILS ABSOLUTE COUNT: 2.3 10*9/L (ref 1.8–7.8)
NEUTROPHILS RELATIVE PERCENT: 57 %
NUCLEATED RED BLOOD CELLS: 0 /100{WBCs} (ref <=4)
PLATELET COUNT: 151 10*9/L (ref 150–450)
RED BLOOD CELL COUNT: 4.09 10*12/L — ABNORMAL LOW (ref 4.26–5.60)
RED CELL DISTRIBUTION WIDTH: 12.3 % (ref 12.2–15.2)
WBC ADJUSTED: 4 10*9/L (ref 3.6–11.2)

## 2024-03-02 LAB — COMPREHENSIVE METABOLIC PANEL
ALBUMIN: 3.3 g/dL — ABNORMAL LOW (ref 3.4–5.0)
ALKALINE PHOSPHATASE: 67 U/L (ref 46–116)
ALT (SGPT): 18 U/L (ref 10–49)
ANION GAP: 13 mmol/L (ref 5–14)
AST (SGOT): 23 U/L (ref <=34)
BILIRUBIN TOTAL: 0.3 mg/dL (ref 0.3–1.2)
BLOOD UREA NITROGEN: 22 mg/dL (ref 9–23)
BUN / CREAT RATIO: 17
CALCIUM: 9.1 mg/dL (ref 8.7–10.4)
CHLORIDE: 101 mmol/L (ref 98–107)
CO2: 27.6 mmol/L (ref 20.0–31.0)
CREATININE: 1.31 mg/dL — ABNORMAL HIGH (ref 0.73–1.18)
EGFR CKD-EPI (2021) MALE: 59 mL/min/1.73m2 — ABNORMAL LOW (ref >=60)
GLUCOSE RANDOM: 84 mg/dL (ref 70–179)
POTASSIUM: 4 mmol/L (ref 3.4–4.8)
PROTEIN TOTAL: 6.8 g/dL (ref 5.7–8.2)
SODIUM: 142 mmol/L (ref 135–145)

## 2024-03-02 LAB — VITAMIN D 25 HYDROXY: VITAMIN D, TOTAL (25OH): 65 ng/mL (ref 20.0–80.0)

## 2024-03-02 MED ADMIN — ensifentrine NbSp 3 mg **PATIENT SUPPLIED**: 3 mg | RESPIRATORY_TRACT | @ 02:00:00

## 2024-03-02 MED ADMIN — ensifentrine NbSp 3 mg **PATIENT SUPPLIED**: 3 mg | RESPIRATORY_TRACT | @ 14:00:00

## 2024-03-02 MED ADMIN — HYDROcodone-acetaminophen (NORCO) 5-325 mg per tablet 2 tablet: 2 | ORAL | Stop: 2024-03-08

## 2024-03-02 MED ADMIN — HYDROcodone-acetaminophen (NORCO) 5-325 mg per tablet 1 tablet: 1 | ORAL | @ 11:00:00 | Stop: 2024-03-02

## 2024-03-02 MED ADMIN — HYDROcodone-acetaminophen (NORCO) 5-325 mg per tablet 1 tablet: 1 | ORAL | @ 17:00:00 | Stop: 2024-03-02

## 2024-03-02 MED ADMIN — tobramycin (PF) (TOBI) 300 mg/5 mL nebulizer solution 300 mg: 300 mg | RESPIRATORY_TRACT | @ 14:00:00 | Stop: 2024-03-14

## 2024-03-02 MED ADMIN — tobramycin (PF) (TOBI) 300 mg/5 mL nebulizer solution 300 mg: 300 mg | RESPIRATORY_TRACT | @ 02:00:00 | Stop: 2024-03-14

## 2024-03-02 MED ADMIN — albuterol 2.5 mg /3 mL (0.083 %) nebulizer solution 2.5 mg: 2.5 mg | RESPIRATORY_TRACT | @ 01:00:00

## 2024-03-02 MED ADMIN — albuterol 2.5 mg /3 mL (0.083 %) nebulizer solution 2.5 mg: 2.5 mg | RESPIRATORY_TRACT | @ 13:00:00 | Stop: 2024-03-02

## 2024-03-02 MED ADMIN — piperacillin-tazobactam (ZOSYN) IVPB (premix) 4.5 g: 4.5 g | INTRAVENOUS | @ 14:00:00 | Stop: 2024-03-14

## 2024-03-02 MED ADMIN — piperacillin-tazobactam (ZOSYN) IVPB (premix) 4.5 g: 4.5 g | INTRAVENOUS | @ 02:00:00 | Stop: 2024-03-14

## 2024-03-02 MED ADMIN — piperacillin-tazobactam (ZOSYN) IVPB (premix) 4.5 g: 4.5 g | INTRAVENOUS | @ 09:00:00 | Stop: 2024-03-14

## 2024-03-02 MED ADMIN — piperacillin-tazobactam (ZOSYN) IVPB (premix) 4.5 g: 4.5 g | INTRAVENOUS | @ 21:00:00 | Stop: 2024-03-14

## 2024-03-02 MED ADMIN — spironolactone (ALDACTONE) tablet 25 mg: 25 mg | ORAL | @ 14:00:00

## 2024-03-02 MED ADMIN — ferrous sulfate tablet 325 mg: 325 mg | ORAL | @ 14:00:00

## 2024-03-02 MED ADMIN — levoFLOXacin (LEVAQUIN) tablet 750 mg: 750 mg | ORAL | @ 11:00:00 | Stop: 2024-03-15

## 2024-03-02 MED ADMIN — cholecalciferol (vitamin D3 25 mcg (1,000 units)) tablet 50 mcg: 50 ug | ORAL | @ 14:00:00

## 2024-03-02 MED ADMIN — sodium chloride 10 % NEBULIZER solution 5 mL: 5 mL | RESPIRATORY_TRACT | @ 14:00:00

## 2024-03-02 MED ADMIN — sodium chloride 10 % NEBULIZER solution 5 mL: 5 mL | RESPIRATORY_TRACT | @ 01:00:00

## 2024-03-02 MED ADMIN — fluticasone propionate (FLONASE) 50 mcg/actuation nasal spray 1 spray: 1 | NASAL | @ 14:00:00

## 2024-03-02 MED ADMIN — albuterol 2.5 mg /3 mL (0.083 %) nebulizer solution 2.5 mg: 2.5 mg | RESPIRATORY_TRACT | @ 17:00:00

## 2024-03-02 MED ADMIN — pantoprazole (Protonix) EC tablet 20 mg: 20 mg | ORAL | @ 14:00:00

## 2024-03-02 MED ADMIN — pantoprazole (Protonix) EC tablet 20 mg: 20 mg | ORAL | @ 22:00:00

## 2024-03-02 MED ADMIN — brensocatib tablet 25 mg ** PATIENT SUPPLIED**: 25 mg | ORAL | @ 15:00:00

## 2024-03-02 MED ADMIN — dilTIAZem (CARDIZEM CD) 24 hr capsule 120 mg: 120 mg | ORAL | @ 14:00:00

## 2024-03-02 MED ADMIN — azithromycin (ZITHROMAX) tablet 250 mg: 250 mg | ORAL | @ 14:00:00 | Stop: 2024-03-15

## 2024-03-02 MED ADMIN — fluticasone furoate-vilanterol (BREO ELLIPTA) 200-25 mcg/dose inhaler 1 puff: 1 | RESPIRATORY_TRACT | @ 14:00:00

## 2024-03-02 MED ADMIN — albuterol 2.5 mg /3 mL (0.083 %) nebulizer solution 2.5 mg: 2.5 mg | RESPIRATORY_TRACT | @ 21:00:00

## 2024-03-02 MED ADMIN — multivitamins, therapeutic with minerals tablet 1 tablet: 1 | ORAL | @ 14:00:00

## 2024-03-02 MED ADMIN — furosemide (LASIX) tablet 60 mg: 60 mg | ORAL | @ 14:00:00

## 2024-03-02 MED ADMIN — dornase alfa (PULMOZYME) 1 mg/mL solution 2.5 mg: 2.5 mg | RESPIRATORY_TRACT | @ 14:00:00

## 2024-03-02 MED ADMIN — enoxaparin (LOVENOX) syringe 40 mg: 40 mg | SUBCUTANEOUS | @ 14:00:00

## 2024-03-02 MED ADMIN — umeclidinium (INCRUSE ELLIPTA) 62.5 mcg/actuation inhaler 1 puff: 1 | RESPIRATORY_TRACT | @ 14:00:00

## 2024-03-02 MED ADMIN — ascorbic acid (vitamin C) (VITAMIN C) tablet 1,000 mg: 1000 mg | ORAL | @ 14:00:00

## 2024-03-02 MED ADMIN — montelukast (SINGULAIR) tablet 10 mg: 10 mg | ORAL | @ 02:00:00

## 2024-03-02 MED ADMIN — atorvastatin (LIPITOR) tablet 10 mg: 10 mg | ORAL | @ 14:00:00

## 2024-03-02 NOTE — Progress Notes (Signed)
 Patient in hospital follow up 12/15

## 2024-03-02 NOTE — Plan of Care (Signed)
 Plan of care reviewed with pt. Pt taking meds as prescribed. Receiving IV antibiotics via peripheral IV with no adverse effects. Plan is to receive PICC line today. Pt is independent with ADLs. Remains on 4L oxygen  via nasal canula with no overt signs of respiratory distress. Will continue to monitor.

## 2024-03-02 NOTE — Procedures (Signed)
 PICC LINE INSERTION PROCEDURE NOTE    Indications:  Antibiotic Therapy and Long Term Therapy    Consent/Time Out:    Risks, benefits and alternatives discussed with patient.  Written Consent was obtained prior to the procedure and is detailed in the medical record.  Prior to the start of the procedure, a time out was taken and the identity of the patient was confirmed via name, medical record number and  date of birth.  The availability of the correct equipment was verified.    Procedure Details:  The vein was identified and measured for appropriate catheter length. Maximum sterile techniques including PPE were utilized per protocol.  Sterile field prepared with necessary supplies and equipment. Insertion site was prepped with chlorhexidine solution and allowed to dry. Lidocaine  2 mL subcutaneously and intradermally administered to insertion site.  The catheter was primed with normal saline.  A 4 FR Single lumen was inserted to the R Basilic vein with 1 insertion attempt(s).  Catheter aspirated, 3 mL blood return present.  The catheter was then flushed with 10 mL of normal saline.   Insertion site cleansed, and sterile dressing applied per manufacturer guidelines. The Central Line Checklist was referenced.  The catheter was inserted without difficulty by Chad Pacey Altizer, BS, BSN, [facility]-BC.    Findings:  Manufacturer:  [employer]  Lot #:  [id number]  CT Injectable (power):  Yes  Arm Circumference:  27  cm  Total catheter length:  40 cm.    External length:  0 cm.  Catheter trimmed:  yes  Port reserved:  No    Vein Size:   Right arm basilic vein compressible:  Yes, measurement:   (see image)    Tip Placement Verification:   [employer] and Sherlock    Workup Time:    60 minutes    Recommendations:  PICC brochure given to patient with teaching instructions  Refer to head of bed sign      See images below:

## 2024-03-02 NOTE — Treatment Plan (Signed)
 Discussed this patient's case with [facility] Nephrologist on call, and he is cleared to receive a PICC line. His GFR on 12/11 is 59, which is improving over recent days.    - Chiquita Sora, MD  Hospitalist Medicine

## 2024-03-02 NOTE — Progress Notes (Signed)
 Daily Progress Note    Assessment/Plan:    Principal Problem:    Bronchiectasis (CMS-HCC)  Active Problems:    Pseudomonas aeruginosa infection    Osteoarthrosis    IDA (iron deficiency anemia)    Dyspnea on exertion    Chronic hypoxemic respiratory failure    (CMS-HCC)    Toxic neuropathy (HHS-HCC)                 Jordan [name] is a 68 y.o. male with who was directly admitted to [facility] for bronchiectasis exacerbation.     Bronchiectasis with acute exacerbation - Chronic hypoxemia needing supplemental O2 - Asthma/COPD overlap  Directly admitted per Pulmonologist (Dr. Blondie) for bronchiectasis exacerbation refractory to PO antibiotics. Prior respiratory cultures with Pseudomonas colonization. On home 2-3 L (usually 2), now on 4L. He has ongoing dyspnea and exertion and fatigue, worse than his usual baseline.  - Pulmonology consulted   - Follow up bacterial and AFB cultures from 12/9 sputum samples  - Antibiotics:  - IV Zosyn   - PO levofloxacin  750 mg daily  - Tobramycin  nebs   - Continue home PO azithromycin  250 mg daily   - Anticipate need for single lumen PICC for 2-3 weeks of abx (order has been placed)   - GFR 59 on 12/11 - okay for PICC per Epic chat discussion with Nephrologist   - Continue home Brinsupri tablet, Ohtuvayre  nebs   - Have placed non-formulary request with pharmacy  - Continue home montelukast  10 mg daily   - Converted Trelegy to formulary equivalent (plan to change to Trelegy 200 after discharge)  - Continue home diuretics as discussed below   - Airway clearance:   - Prefer Metaneb and Vibralung qid with RT   - Pulmozyme  2.5 mg daily   - Nebulized hypertonic saline (10%) bid with pre-treatment with albuterol   - Trend CRP/ESR every 4-5 days   - CRP 9.2, sed rate 39 on 12/9  - Plan for PFTs inpatient prior to discharge    Chronic pain - Peripheral neuropathy  On home Norco  7.5 mg-325 mg tid (scheduled). Continued home dosing here.    Acute on chronic diastolic heart failure with preserved EF - Hypertension   Last TTE 01/17/24 with EF ~55%, normal RV function (poor windows). He recently had  worsening lower extremity edema and hypomagnesemia, which led to increased furosemide  (to 80 mg daily, then 60 mg for worse renal function) and initiation of spironolactone  outpatient.   - Continue PO furosemide  60 mg daily   - Continue PO spironolactone  25 mg daily   - If Cr rises with diuresis, would consider Cardiology consult for RHC (per Dr. Blondie)  - Daily weights   - Continue home magnesium  oxide  - Continue home diltiazem  ER 120 mg daily   - ASCVD risk reduction: Continue home statin     AKI  Recent Cr baseline of ~0.9 - 1.1, with Cr up to 1.46 on 12/03. Now ~1.3.  - Continue daily BMP monitoring.     GERD  On home PPI, continued.    Osteoporosis  On zolendronic acid outpatient. Followed by Endocrinology.   - Endocrinology recently requested vit D check, ordered  - Continue home Vit D      DVT ppx: Lovenox   Diet: Regular     I personally spent 45 minutes face-to-face and non-face-to-face in the care of this patient, which includes all pre, intra, and post visit E/M time on the date of service. All documented time was  specific to the E/M visit and does not include any pre, intra, post procedure related time.    ___________________________________________________________________    Subjective:    No acute ON events. He remains on 4L. This morning, he has no significant changes compared to yesterday. He has ongoing fatigue and exertional dyspnea that is worse than his usual. Cough and sputum production are stable.     No change in leg swelling (which is mild, with chronic, long-standing slightly increased edema on L side).    Spoke to [facility] team and Nephrology over [employer], plan on PICC today.     Labs/Studies:  Labs and Studies from the last 24hrs per EMR and Reviewed    Objective:  Temp:  [36.2 ??C (97.2 ??F)-36.4 ??C (97.5 ??F)] 36.2 ??C (97.2 ??F)  Pulse:  [68-90] 72  Resp:  [18] 18  BP: (107-121)/(60-64) 121/63  SpO2:  [4 %-99 %] 99 %    General:  Sitting up in chair, in NAD  HEENT: Atraumatic, 4L via [place]  Heart:  Regular rate and rhythm  Lungs:  Scattered coarse breath sounds, stable   Extremities:  Warm, trace peripheral edema (1+ on L, stable per patient).  Psychiatric:  Normal mood and affect, intact judgment and insight, conversational

## 2024-03-02 NOTE — Plan of Care (Signed)
 Shift Summary  Oxygen  therapy and respiratory medications were administered during the shift.   Fall reduction, infection prevention, and skin protection protocols were maintained.   Comfort and independence in self-care activities were supported with frequent repositioning and assistance as needed.   Pain remained well controlled with no reported discomfort.   Overall, the patient remained stable and continued to meet care goals throughout the shift.     Absence of Hospital-Acquired Illness or Injury: No new hospital-acquired injuries or complications documented; fall reduction program and skin protection interventions were consistently maintained throughout the shift.     Optimal Comfort and Wellbeing: Comfort measures such as frequent repositioning and use of pillows were provided, and patient remained able to feed self and perform oral care independently.     Absence of Infection Signs and Symptoms: Aseptic technique and infection prevention protocols were maintained, and isolation precautions were followed; SOFA score remained stable at 1 throughout the shift.     Blood Pressure in Desired Range: Mean arterial pressure was recorded at 85 mmHg, which is within the desired range for most adults.     Optimal Pain Control and Function: Pain was consistently assessed as 0 on the 0-10 scale, and no pain interventions were required.

## 2024-03-02 NOTE — Plan of Care (Signed)
 Pt received all scheduled inhaled medications.  Volara done for airway clearance.  Pt has productive cough, continues to be green/gray.  BBS crackles, more prominent in the bases. On 4L [place], 2 is his baseline.      Problem: Airway Clearance Ineffective  Goal: Effective Airway Clearance  Outcome: Ongoing - Unchanged

## 2024-03-02 NOTE — Consults (Signed)
 General Pulmonary Team Follow Up Consult Note     Date of Service: 03/02/2024  Requesting Physician: Jordan Almarie Sora, MD   Requesting Service: Med Undesignated (MDX)  Reason for consultation: Comprehensive evaluation of Bronchiectasis exacerbation.    Hospital Problems:  Principal Problem:    Bronchiectasis (CMS-HCC)  Active Problems:    Pseudomonas aeruginosa infection    Osteoarthrosis    IDA (iron deficiency anemia)    Dyspnea on exertion    Chronic hypoxemic respiratory failure    (CMS-HCC)    Toxic neuropathy (HHS-HCC)      HPI: Jordan [name] is a 68 y.o. male with bronchiectasis secondary to unknown cause (idiopathic) with Pseudomonas colonization, chronic hypoxemic respiratory failure, and asthma/COPD overlap. He presents for admission after failing to improve with outpatient oral antibiotics with increased sputum, hypoxia and fatigue.  Admitted for IV antibiotics for bronchiectasis exacerbation. Follows closely in our Bronchiectasis clinic with Dr. Blondie.    Assessment     Interval Events: Feels about same this am. Cough with sputum with treatments. Plans for PICC today       Impression: The patient has acute illness with systemic symptoms. Is being treated for acute bronchiectasis exacerbation with increased O2 needs.     Problems addressed during this consult include acute on chronic respiratory failure and bronchiectasis.       Recommendations     Reason for Admission: Bronchiectasis exacerbation refractory to oral antibiotics and increased O2 needs     Antibiotics:  Based on prior culture data (OPF, PsA) and allergy profile, favor treating with:  IV Zosyn  if staying in hospital for duration. (Cost of IV ceftazidime  was too high during last admission).  Oral levofloxacin   [name]  nebs  Will need single lumen PICC line for 2-3 weeks of IV antibiotics. Duration to be determined based on clinical response and improvement in inflammatory markers.   Continue chronic azithromycin  for anti-inflammatory effect.     Other medications:  Continue home Brinsupri, Ohtuvayre   Convert Trelegy to formulary equivalent.  May change to Trelegy 200 after discharge.  Lasix  - may need up titration while on IV antibiotics due to increased fluid volume, does have a bit of a bump in Cr from recent use so monitoring     Airway clearance: Needs aggressive airway clearance 4 times per day.  Nebulized hypertonic saline 10%; pre-treatment with albuterol .  For mechanical clearance, prefer metaneb and Vibralung.  Pulmozyme  2.5 mg daily        Labs:  CRP normal at admission, SED elevated at 39. WBC normal. Sputum culture pending.BMP with slowly improving Cr 1.31 (improving over from last few weeks with diuresis but up from prior baseline) Electrolytes ok.   CRP and ESR every 4-5 days to trend (including as outpatient).       Dispo:   Patient has a follow-up appointment with Dr. Blondie scheduled fro February 24.  Patient does not need to remain in the hospital for the entire course of IV antibiotics but can get PFTs prior to discharge.   Please reach out to Jordan [name] Bronchiectasis[facility] team (Dr. Blondie, Jordan Lever RN, Jordan [name] CPP) prior to discharge to ensure that home infusion orders are correct and that we are comfortable with patient discharging home on IVs at that time.  They will follow monitoring labs.  Please use [email or web address] for home infusion orders. Bronchiectasis clinic will follow monitoring labs. Does not need OPAT.       Please do not hesitate  to page [phone] [facility] consult) with questions. We appreciate the opportunity to assist in the care of this patient. The recommendations outlined in this note were discussed w the primary team via epic chat. We look forward to following with you.     Jordan CHRISTELLA Simpers, MD      Subjective & Objective     Subjective: PER HPI         Vitals - past 24 hours  Temp:  [36.2 ??C (97.2 ??F)-36.4 ??C (97.5 ??F)] 36.2 ??C (97.2 ??F)  Pulse:  [68-90] 72  Resp:  [18] 18  BP: (111-121)/(63-64) 121/63  SpO2:  [4 %-99 %] 99 % Intake/Output  No intake/output data recorded.        Pertinent exam findings:   General appearance - NAD, fatigued, no increased WOB at rest  Heart - normal rate and regular rhythm  Chest - bilateral coarse BS at mid to bases. No wheeze    Extremities - mild LE edema      Relevant Imaging Data   - CXR on 07/2023: extensive bilateral bronchiectasis and right apical pleural thickening with scarring/volume los - chronic  - echo 12/16/2023: very difficult study per read, LVEF 55% and right ventricle appears normal    Arterial Blood Gas:   No results for input(s): SPECTYPEART, PHART, PCO2ART, PO2ART, HCO3ART, BEART, O2SATART in the last 24 hours.     Venous Blood Gas:   No results for input(s): PHVEN, PCO2VEN, PO2VEN, HCO3VEN, BEVEN, O2SATVEN in the last 24 hours.     Cultures:  Lower Respiratory Culture (no units)   Date Value   02/11/2024 OROPHARYNGEAL FLORA ISOLATED     WBC (10*9/L)   Date Value   03/02/2024 4.0          Other Labs:  Lab Results   Component Value Date    WBC 4.0 03/02/2024    HGB 12.5 (L) 03/02/2024    HCT 37.1 (L) 03/02/2024    PLT 151 03/02/2024     Lab Results   Component Value Date    NA 142 03/02/2024    K 4.0 03/02/2024    CL 101 03/02/2024    CO2 27.6 03/02/2024    BUN 22 03/02/2024    CREATININE 1.31 (H) 03/02/2024    GLU 84 03/02/2024    CALCIUM  9.1 03/02/2024    MG 1.8 02/23/2024    PHOS 3.7 01/22/2016     Lab Results   Component Value Date    BILITOT 0.3 03/02/2024    BILIDIR 0.40 04/02/2015    PROT 6.8 03/02/2024    ALBUMIN 3.3 (L) 03/02/2024    ALT 18 03/02/2024    AST 23 03/02/2024    ALKPHOS 67 03/02/2024    GGT 64 06/21/2013     Lab Results   Component Value Date    LABPROT 10.4 08/03/2013    INR 0.95 04/14/2017    APTT 32.6 07/25/2014       Allergies & Home Medications   Personally reviewed in Epic    Continuous Infusions:   Infusions Meds[1]    Scheduled Medications:   Scheduled Medications[2]    PRN medications:  PRN Medications[3]             Medical Decision Making on 03/02/2024     External Notes:    I reviewed the following: Progress note(s) , Consultant note(s) , Nursing note(s), Therapist note(s), and H&P note(s)    Results:   I reviewed the following labs/reports for this consult -  CBC unremarkable or unchanged from prior., chemistry abnormal d/t elevated but stable Cr., and microbiology abnormal d/t prior pseudomonas.    Tests Ordered:   See recommendations above    Historian: no independent historian required    Imaging: I independently viewed and interpreted the following studies for this consult - CXR images        Risks of management:    - Management of airway clearance devices/medications.  - Drug therapy requiring intensive monitoring for toxicity.                       [1] [2]    albuterol   2.5 mg Nebulization TID (RT)    ascorbic acid  (vitamin C )  1,000 mg Oral Daily    atorvastatin   10 mg Oral Daily    azithromycin   250 mg Oral Q24H SCH    brensocatib  25 mg Oral Daily    cholecalciferol  (vitamin D3 25 mcg (1,000 units))  50 mcg Oral Daily    dilTIAZem   120 mg Oral Daily    dornase alfa   2.5 mg Inhalation Daily (RT)    enoxaparin  (LOVENOX ) injection  40 mg Subcutaneous Q24H SCH    ensifentrine   3 mg Nebulization BID    ferrous sulfate   325 mg Oral Daily    fluticasone  furoate-vilanterol  1 puff Inhalation Daily (RT)    And    umeclidinium  1 puff Inhalation Daily (RT)    fluticasone  propionate  1 spray Each Nare Daily    furosemide   60 mg Oral Daily    HYDROcodone -acetaminophen   1 tablet Oral TID    levoFLOXacin   750 mg Oral daily    montelukast   10 mg Oral Nightly    multivitamins (ADULT)  1 tablet Oral Daily    pantoprazole   20 mg Oral BID AC    piperacillin -tazobactam (ZOSYN ) IV (intermittent)  4.5 g Intravenous Q6H    sodium chloride   5 mL Nebulization BID (RT)    spironolactone   25 mg Oral Daily    tobramycin  (PF)  300 mg Nebulization BID (RT)   [3] acetaminophen , albuterol , melatonin, senna **OR** polyethylene glycol

## 2024-03-02 NOTE — Plan of Care (Signed)
 Problem: Airway Clearance Ineffective  Goal: Effective Airway Clearance  Outcome: Ongoing - Unchanged  Intervention: Promote Airway Secretion Clearance  Recent Flowsheet Documentation  Taken 03/02/2024 9177 by Clorinda Sierra HERO, RRT  Breathing Techniques/Airway Clearance: deep/controlled cough encouraged   Pt received all scheduled Airwayclearence, started Vibralung today. Pt seem to be tolerating it well. Improved secretion mobilization per pt and productive. Will continue per pt tolerance

## 2024-03-03 LAB — COMPREHENSIVE METABOLIC PANEL
ALBUMIN: 3.2 g/dL — ABNORMAL LOW (ref 3.4–5.0)
ALKALINE PHOSPHATASE: 69 U/L (ref 46–116)
ALT (SGPT): 23 U/L (ref 10–49)
ANION GAP: 12 mmol/L (ref 5–14)
AST (SGOT): 23 U/L (ref <=34)
BILIRUBIN TOTAL: 0.2 mg/dL — ABNORMAL LOW (ref 0.3–1.2)
BLOOD UREA NITROGEN: 22 mg/dL (ref 9–23)
BUN / CREAT RATIO: 17
CALCIUM: 9 mg/dL (ref 8.7–10.4)
CHLORIDE: 101 mmol/L (ref 98–107)
CO2: 30.1 mmol/L (ref 20.0–31.0)
CREATININE: 1.28 mg/dL — ABNORMAL HIGH (ref 0.73–1.18)
EGFR CKD-EPI (2021) MALE: 61 mL/min/1.73m2 (ref >=60)
GLUCOSE RANDOM: 88 mg/dL (ref 70–179)
POTASSIUM: 3.5 mmol/L (ref 3.4–4.8)
PROTEIN TOTAL: 6.7 g/dL (ref 5.7–8.2)
SODIUM: 143 mmol/L (ref 135–145)

## 2024-03-03 LAB — CBC W/ AUTO DIFF
BASOPHILS ABSOLUTE COUNT: 0.1 10*9/L (ref 0.0–0.1)
BASOPHILS RELATIVE PERCENT: 1.3 %
EOSINOPHILS ABSOLUTE COUNT: 0.2 10*9/L (ref 0.0–0.5)
EOSINOPHILS RELATIVE PERCENT: 2.8 %
HEMATOCRIT: 35.6 % — ABNORMAL LOW (ref 39.0–48.0)
HEMOGLOBIN: 12.2 g/dL — ABNORMAL LOW (ref 12.9–16.5)
LYMPHOCYTES ABSOLUTE COUNT: 1.2 10*9/L (ref 1.1–3.6)
LYMPHOCYTES RELATIVE PERCENT: 20.5 %
MEAN CORPUSCULAR HEMOGLOBIN CONC: 34.2 g/dL (ref 32.0–36.0)
MEAN CORPUSCULAR HEMOGLOBIN: 30.7 pg (ref 25.9–32.4)
MEAN CORPUSCULAR VOLUME: 89.7 fL (ref 77.6–95.7)
MEAN PLATELET VOLUME: 8 fL (ref 6.8–10.7)
MONOCYTES ABSOLUTE COUNT: 0.7 10*9/L (ref 0.3–0.8)
MONOCYTES RELATIVE PERCENT: 11.4 %
NEUTROPHILS ABSOLUTE COUNT: 3.8 10*9/L (ref 1.8–7.8)
NEUTROPHILS RELATIVE PERCENT: 64 %
NUCLEATED RED BLOOD CELLS: 0 /100{WBCs} (ref <=4)
PLATELET COUNT: 155 10*9/L (ref 150–450)
RED BLOOD CELL COUNT: 3.97 10*12/L — ABNORMAL LOW (ref 4.26–5.60)
RED CELL DISTRIBUTION WIDTH: 12.5 % (ref 12.2–15.2)
WBC ADJUSTED: 6 10*9/L (ref 3.6–11.2)

## 2024-03-03 MED ORDER — TRELEGY ELLIPTA 200 MCG-62.5 MCG-25 MCG POWDER FOR INHALATION
Freq: Every day | RESPIRATORY_TRACT | 3 refills | 90.00000 days | Status: CP
Start: 2024-03-03 — End: ?

## 2024-03-03 MED ORDER — FUROSEMIDE 40 MG TABLET
ORAL_TABLET | Freq: Every day | ORAL | 0 refills | 90.00000 days
Start: 2024-03-03 — End: 2024-06-01

## 2024-03-03 MED ADMIN — ensifentrine NbSp 3 mg **PATIENT SUPPLIED**: 3 mg | RESPIRATORY_TRACT | @ 15:00:00 | Stop: 2024-03-03

## 2024-03-03 MED ADMIN — HYDROcodone-acetaminophen (NORCO) 5-325 mg per tablet 2 tablet: 2 | ORAL | @ 10:00:00 | Stop: 2024-03-03

## 2024-03-03 MED ADMIN — HYDROcodone-acetaminophen (NORCO) 5-325 mg per tablet 2 tablet: 2 | ORAL | @ 16:00:00 | Stop: 2024-03-03

## 2024-03-03 MED ADMIN — tobramycin (PF) (TOBI) 300 mg/5 mL nebulizer solution 300 mg: 300 mg | RESPIRATORY_TRACT | @ 15:00:00 | Stop: 2024-03-03

## 2024-03-03 MED ADMIN — tobramycin (PF) (TOBI) 300 mg/5 mL nebulizer solution 300 mg: 300 mg | RESPIRATORY_TRACT | @ 02:00:00 | Stop: 2024-03-14

## 2024-03-03 MED ADMIN — piperacillin-tazobactam (ZOSYN) IVPB (premix) 4.5 g: 4.5 g | INTRAVENOUS | @ 15:00:00 | Stop: 2024-03-03

## 2024-03-03 MED ADMIN — piperacillin-tazobactam (ZOSYN) IVPB (premix) 4.5 g: 4.5 g | INTRAVENOUS | @ 10:00:00 | Stop: 2024-03-03

## 2024-03-03 MED ADMIN — piperacillin-tazobactam (ZOSYN) IVPB (premix) 4.5 g: 4.5 g | INTRAVENOUS | @ 21:00:00 | Stop: 2024-03-03

## 2024-03-03 MED ADMIN — piperacillin-tazobactam (ZOSYN) IVPB (premix) 4.5 g: 4.5 g | INTRAVENOUS | @ 03:00:00 | Stop: 2024-03-14

## 2024-03-03 MED ADMIN — spironolactone (ALDACTONE) tablet 25 mg: 25 mg | ORAL | @ 13:00:00 | Stop: 2024-03-03

## 2024-03-03 MED ADMIN — ferrous sulfate tablet 325 mg: 325 mg | ORAL | @ 13:00:00 | Stop: 2024-03-03

## 2024-03-03 MED ADMIN — levoFLOXacin (LEVAQUIN) tablet 750 mg: 750 mg | ORAL | @ 10:00:00 | Stop: 2024-03-03

## 2024-03-03 MED ADMIN — cholecalciferol (vitamin D3 25 mcg (1,000 units)) tablet 50 mcg: 50 ug | ORAL | @ 13:00:00 | Stop: 2024-03-03

## 2024-03-03 MED ADMIN — sodium chloride 10 % NEBULIZER solution 5 mL: 5 mL | RESPIRATORY_TRACT | @ 02:00:00

## 2024-03-03 MED ADMIN — sodium chloride 10 % NEBULIZER solution 5 mL: 5 mL | RESPIRATORY_TRACT | @ 15:00:00 | Stop: 2024-03-03

## 2024-03-03 MED ADMIN — fluticasone propionate (FLONASE) 50 mcg/actuation nasal spray 1 spray: 1 | NASAL | @ 13:00:00 | Stop: 2024-03-03

## 2024-03-03 MED ADMIN — sodium chloride (NS) 0.9 % flush 10 mL: 10 mL | INTRAVENOUS | @ 13:00:00 | Stop: 2024-03-03

## 2024-03-03 MED ADMIN — sodium chloride (NS) 0.9 % flush 10 mL: 10 mL | INTRAVENOUS | @ 03:00:00

## 2024-03-03 MED ADMIN — pantoprazole (Protonix) EC tablet 20 mg: 20 mg | ORAL | @ 13:00:00 | Stop: 2024-03-03

## 2024-03-03 MED ADMIN — brensocatib tablet 25 mg ** PATIENT SUPPLIED**: 25 mg | ORAL | @ 15:00:00 | Stop: 2024-03-03

## 2024-03-03 MED ADMIN — dilTIAZem (CARDIZEM CD) 24 hr capsule 120 mg: 120 mg | ORAL | @ 13:00:00 | Stop: 2024-03-03

## 2024-03-03 MED ADMIN — azithromycin (ZITHROMAX) tablet 250 mg: 250 mg | ORAL | @ 13:00:00 | Stop: 2024-03-03

## 2024-03-03 MED ADMIN — fluticasone furoate-vilanterol (BREO ELLIPTA) 200-25 mcg/dose inhaler 1 puff: 1 | RESPIRATORY_TRACT | @ 15:00:00 | Stop: 2024-03-03

## 2024-03-03 MED ADMIN — albuterol 2.5 mg /3 mL (0.083 %) nebulizer solution 2.5 mg: 2.5 mg | RESPIRATORY_TRACT | @ 20:00:00 | Stop: 2024-03-03

## 2024-03-03 MED ADMIN — albuterol 2.5 mg /3 mL (0.083 %) nebulizer solution 2.5 mg: 2.5 mg | RESPIRATORY_TRACT | @ 17:00:00 | Stop: 2024-03-03

## 2024-03-03 MED ADMIN — albuterol 2.5 mg /3 mL (0.083 %) nebulizer solution 2.5 mg: 2.5 mg | RESPIRATORY_TRACT | @ 01:00:00

## 2024-03-03 MED ADMIN — albuterol 2.5 mg /3 mL (0.083 %) nebulizer solution 2.5 mg: 2.5 mg | RESPIRATORY_TRACT | @ 15:00:00 | Stop: 2024-03-03

## 2024-03-03 MED ADMIN — multivitamins, therapeutic with minerals tablet 1 tablet: 1 | ORAL | @ 13:00:00 | Stop: 2024-03-03

## 2024-03-03 MED ADMIN — furosemide (LASIX) tablet 60 mg: 60 mg | ORAL | @ 13:00:00 | Stop: 2024-03-03

## 2024-03-03 MED ADMIN — dornase alfa (PULMOZYME) 1 mg/mL solution 2.5 mg: 2.5 mg | RESPIRATORY_TRACT | @ 15:00:00 | Stop: 2024-03-03

## 2024-03-03 MED ADMIN — enoxaparin (LOVENOX) syringe 40 mg: 40 mg | SUBCUTANEOUS | @ 13:00:00 | Stop: 2024-03-03

## 2024-03-03 MED ADMIN — umeclidinium (INCRUSE ELLIPTA) 62.5 mcg/actuation inhaler 1 puff: 1 | RESPIRATORY_TRACT | @ 15:00:00 | Stop: 2024-03-03

## 2024-03-03 MED ADMIN — ascorbic acid (vitamin C) (VITAMIN C) tablet 1,000 mg: 1000 mg | ORAL | @ 13:00:00 | Stop: 2024-03-03

## 2024-03-03 MED ADMIN — montelukast (SINGULAIR) tablet 10 mg: 10 mg | ORAL | @ 03:00:00

## 2024-03-03 MED ADMIN — atorvastatin (LIPITOR) tablet 10 mg: 10 mg | ORAL | @ 13:00:00 | Stop: 2024-03-03

## 2024-03-03 NOTE — Consults (Signed)
 General Pulmonary Team Follow Up Consult Note     Date of Service: 03/03/2024  Requesting Physician: Chiquita Almarie Sora, MD   Requesting Service: Med Undesignated (MDX)  Reason for consultation: Comprehensive evaluation of Bronchiectasis exacerbation.    Hospital Problems:  Principal Problem:    Bronchiectasis (CMS-HCC)  Active Problems:    Pseudomonas aeruginosa infection    Osteoarthrosis    IDA (iron deficiency anemia)    Dyspnea on exertion    Chronic hypoxemic respiratory failure    (CMS-HCC)    Toxic neuropathy (HHS-HCC)      HPI: Jordan [name] is a 68 y.o. male with bronchiectasis secondary to unknown cause (idiopathic) with Pseudomonas colonization, chronic hypoxemic respiratory failure, and asthma/COPD overlap. He presents for admission after failing to improve with outpatient oral antibiotics with increased sputum, hypoxia and fatigue.  Admitted for IV antibiotics for bronchiectasis exacerbation. Follows closely in our Bronchiectasis clinic with Dr. Blondie.    Assessment     Interval Events: Feels better today - he turned a corner yesterday afternoon and hopes to go home to complete antibiotics.        Impression: The patient has acute illness with systemic symptoms. Is being treated for acute bronchiectasis exacerbation with increased O2 needs.     Problems addressed during this consult include acute on chronic respiratory failure and bronchiectasis.       Recommendations     Reason for Admission: Bronchiectasis exacerbation refractory to oral antibiotics and increased O2 needs     Antibiotics:  Based on prior culture data (OPF, PsA) and allergy profile, favor treating with:  IV Zosyn    Oral levofloxacin   [name]  nebs  Will need single lumen PICC line for 2-3 weeks of IV antibiotics. Duration to be determined based on clinical response and improvement in inflammatory markers.   Continue chronic azithromycin  for anti-inflammatory effect.     Other medications:  Continue home Brinsupri , Ohtuvayre   Convert Trelegy to formulary equivalent.  May change to Trelegy 200 after discharge.       Airway clearance: Needs aggressive airway clearance 4 times per day.  Nebulized hypertonic saline 10%; pre-treatment with albuterol .  For mechanical clearance, prefer metaneb and Vibralung.  Pulmozyme  2.5 mg daily        Labs:  CRP normal at admission, SED elevated at 39. WBC normal. Sputum culture with mucoid pseudomonas.SABRABMP with slowly improving Cr 1.28 (improving over from last few weeks with diuresis but up from prior baseline) Electrolytes ok.   CRP and ESR every 4-5 days to trend (including as outpatient).       Dispo:   Patient has a follow-up appointment with Dr. Blondie scheduled for February 24.  Patient does not need to remain in the hospital for the entire course of IV antibiotics but can get PFTs prior to discharge.   Please reach out to [facility] and [facility] team (Dr. Blondie, Darice Lever RN, Joesph KYM Molt CPP) prior to discharge to ensure that home infusion orders are correct and that we are comfortable with patient discharging home on IVs at that time.  They will follow monitoring labs.  Please use [email or web address] for home infusion orders. Bronchiectasis clinic will follow monitoring labs. Does not need OPAT.       Please do not hesitate to page [phone] [facility] consult) with questions. We appreciate the opportunity to assist in the care of this patient. The recommendations outlined in this note were discussed w the primary team via epic chat. We look  forward to following with you.     [name], MD      Subjective & Objective     Subjective: PER HPI         Vitals - past 24 hours  Temp:  [35.7 ??C (96.3 ??F)-35.8 ??C (96.4 ??F)] 35.8 ??C (96.4 ??F)  Pulse:  [75-96] 82  Resp:  [18] 18  BP: (114-121)/(60-68) 121/60  SpO2:  [98 %-100 %] 100 % Intake/Output  I/O last 3 completed shifts:  In: 100 [IV Piggyback:100]  Out: -         Pertinent exam findings:   General appearance - NAD, fatigued, no increased WOB at rest  Heart - normal rate and regular rhythm  Chest - bilateral coarse BS at mid to bases. No wheeze    Extremities - mild LE edema      Relevant Imaging Data   - CXR on 07/2023: extensive bilateral bronchiectasis and right apical pleural thickening with scarring/volume los - chronic  - echo 12/16/2023: very difficult study per read, LVEF 55% and right ventricle appears normal    Arterial Blood Gas:   No results for input(s): SPECTYPEART, PHART, PCO2ART, PO2ART, HCO3ART, BEART, O2SATART in the last 24 hours.     Venous Blood Gas:   No results for input(s): PHVEN, PCO2VEN, PO2VEN, HCO3VEN, BEVEN, O2SATVEN in the last 24 hours.     Cultures:  Lower Respiratory Culture (no units)   Date Value   02/29/2024 2+ Mucoid Pseudomonas aeruginosa (A)   02/29/2024 2+ Oropharyngeal Flora Isolated     WBC (10*9/L)   Date Value   03/03/2024 6.0          Other Labs:  Lab Results   Component Value Date    WBC 6.0 03/03/2024    HGB 12.2 (L) 03/03/2024    HCT 35.6 (L) 03/03/2024    PLT 155 03/03/2024     Lab Results   Component Value Date    NA 143 03/03/2024    K 3.5 03/03/2024    CL 101 03/03/2024    CO2 30.1 03/03/2024    BUN 22 03/03/2024    CREATININE 1.28 (H) 03/03/2024    GLU 88 03/03/2024    CALCIUM  9.0 03/03/2024    MG 1.8 02/23/2024    PHOS 3.7 01/22/2016     Lab Results   Component Value Date    BILITOT 0.2 (L) 03/03/2024    BILIDIR 0.40 04/02/2015    PROT 6.7 03/03/2024    ALBUMIN 3.2 (L) 03/03/2024    ALT 23 03/03/2024    AST 23 03/03/2024    ALKPHOS 69 03/03/2024    GGT 64 06/21/2013     Lab Results   Component Value Date    LABPROT 10.4 08/03/2013    INR 0.95 04/14/2017    APTT 32.6 07/25/2014       Allergies & Home Medications   Personally reviewed in Epic    Continuous Infusions:   Infusions Meds[1]    Scheduled Medications:   Scheduled Medications[2]    PRN medications:  PRN Medications[3]             Medical Decision Making on 03/03/2024 External Notes:    I reviewed the following: Progress note(s) , Consultant note(s) , Nursing note(s), Therapist note(s), and H&P note(s)    Results:   I reviewed the following labs/reports for this consult - CBC unremarkable or unchanged from prior., chemistry abnormal d/t elevated but stable Cr., and microbiology abnormal d/t prior pseudomonas.  Tests Ordered:   See recommendations above    Historian: no independent historian required    Imaging: I independently viewed and interpreted the following studies for this consult - CXR images        Risks of management:    - Management of airway clearance devices/medications.  - Drug therapy requiring intensive monitoring for toxicity.                       [1] [2]    albuterol   2.5 mg Nebulization 4x Daily (RT)    ascorbic acid  (vitamin C )  1,000 mg Oral Daily    atorvastatin   10 mg Oral Daily    azithromycin   250 mg Oral Q24H SCH    brensocatib   25 mg Oral Daily    cholecalciferol  (vitamin D3 25 mcg (1,000 units))  50 mcg Oral Daily    dilTIAZem   120 mg Oral Daily    dornase alfa   2.5 mg Inhalation Daily (RT)    enoxaparin  (LOVENOX ) injection  40 mg Subcutaneous Q24H SCH    ensifentrine   3 mg Nebulization BID    ferrous sulfate   325 mg Oral Daily    fluticasone  furoate-vilanterol  1 puff Inhalation Daily (RT)    And    umeclidinium  1 puff Inhalation Daily (RT)    fluticasone  propionate  1 spray Each Nare Daily    furosemide   60 mg Oral Daily    HYDROcodone -acetaminophen   2 tablet Oral TID    levoFLOXacin   750 mg Oral daily    montelukast   10 mg Oral Nightly    multivitamins (ADULT)  1 tablet Oral Daily    pantoprazole   20 mg Oral BID AC    piperacillin -tazobactam (ZOSYN ) IV (intermittent)  4.5 g Intravenous Q6H    sodium chloride   10 mL Intravenous BID    sodium chloride   5 mL Nebulization BID (RT)    spironolactone   25 mg Oral Daily    tobramycin  (PF)  300 mg Nebulization BID (RT)   [3] albuterol , melatonin, senna **OR** polyethylene glycol

## 2024-03-03 NOTE — Discharge Summary (Signed)
 Physician Discharge Summary HBR  4 BT1 HBR  [address]  [place] [address]  Dept: [phone]  Loc: [phone]     Identifying Information:   Jordan [name]  [birth year]  [id number]    Primary Care Physician: Frederik Lamar POUR, MD     Code Status: Full Code    Admit Date: 02/29/2024    Discharge Date: 03/03/2024     Discharge To: Home with Home Health and/or PT/OT    Discharge Service: HBR - HBR: Hospitalist [facility]     Discharge Attending Physician: Chiquita Sora, MD    Discharge Diagnoses:     Principal Problem:    Bronchiectasis (CMS-HCC) (POA: Yes)  Active Problems:    Pseudomonas aeruginosa infection (POA: Yes)    Osteoarthrosis (POA: Yes)    IDA (iron deficiency anemia) (POA: Yes)    Dyspnea on exertion (POA: Yes)    Chronic hypoxemic respiratory failure    (CMS-HCC) (POA: Yes)    Toxic neuropathy (HHS-HCC) (POA: Yes)  Resolved Problems:    * No resolved hospital problems. *      Hospital Course:     Jordan [name] is a 68 y.o. male with who was directly admitted to [facility] for bronchiectasis exacerbation.      Bronchiectasis with acute exacerbation - Chronic hypoxemia needing supplemental O2 - Asthma/COPD overlap  He was directly admitted per pulmonologist (Dr. Blondie) for bronchiectasis exacerbation refractory to PO antibiotics, with increased fatigue and dyspnea on exertion. Prior respiratory cultures with Pseudomonas colonization, and LRCx from 12/9 also grew mucoid PsA. He is usually on home 2-3 L (usually 2) and was requiring up to 4L during this hospitalization. Pulmonology was consulted, and he was treated with IV Zosyn  and PO levofloxacin  750 mg daily, with continuation of home tobramycin  nebs and PO azithromycin . He was also continued on aggressive airway clearance, including bid hypertonic saline, his home Brinsupri  tablet and Ohtuvayre  nebs, his home montelukast , and the equivalent of his home Trelegy.     A single lumen PICC was placed on 12/11 to continue IV Zosyn  at home. He reported feeling an improvement in symptoms on 12/12, and he was discharged home with plan to continue course of PO levofloxacin  and IV Zosyn  for at least a 2-3 week course. His outpatient bronchiectasis team will continue to follow him outpatient. Per prior documentation by Dr. Blondie, prescribed increased Trelegy dose of 200-62.5-25 mcg on discharge as well.      Chronic pain - Peripheral neuropathy  On home Norco  7.5 mg-325 mg tid (scheduled). Continued this regimen on discharge.      Acute on chronic diastolic heart failure with preserved EF - Hypertension   Last TTE 01/17/24 with EF ~55%, normal RV function (poor windows). He recently had  worsening lower extremity edema and hypomagnesemia, which led to increased furosemide  (to 80 mg daily, then 60 mg for worse renal function) and initiation of spironolactone  outpatient. His home regimen of PO Lasix  60 mg, PO spironolactone  25 mg, and diltiazem  ER 120 mg daily was continued. He had trace to 1+ edema of his legs, L more so than R (which patient said was long-standing and chronic).     AKI  Recent Cr baseline of ~0.9 - 1.1, with Cr up to 1.46 on 12/03. Cr ~1.3 by discharge.      GERD  On home PPI, continued.     Osteoporosis  On zolendronic acid outpatient. Followed by Endocrinology. Checked Vit D inpatient, which was 54 - recommended decreasing  his dose of Vit D 2000 units daily to ~400 units per day, with Endocrinology follow up.       The patient's hospital stay has been complicated by the following clinically significant conditions requiring additional evaluation and treatment or having a significant effect of this patient's care: - Chronic kidney disease POA requiring further investigation, treatment, or monitoring  - Complex social situation/SDOH requiring consultation and support of Care Management       Outpatient Provider Follow Up Issues:   [ ]  Outpatient Pulmonology/Bronchiectasis team to determine duration of antibiotics for bronchiectasis exacerbation  [ ]  Defer to Pulmonology team re: future lung imaging   [ ]  Endocrinology to continue follow up for osteoporosis: as above, recommended decreasing PO Vit D intake given level >60    ______________________________________________________________________  Discharge Medications:      Your Medication List        STOP taking these medications      TRELEGY ELLIPTA  100-62.5-25 mcg inhaler  Generic drug: fluticasone -umeclidin-vilanter  Replaced by: TRELEGY ELLIPTA  200-62.5-25 mcg inhaler            START taking these medications      levoFLOXacin  750 MG tablet  Commonly known as: LEVAQUIN   Take 1 tablet (750 mg total) by mouth daily for 10 days. Take every morning, you may need to extend if directed by Pulmonology  Start taking on: March 04, 2024     piperacillin -tazobactam 4.5 gram/100 mL Pgbk  Commonly known as: ZOSYN   Infuse 100 mL (4.5 g total) into a venous catheter every six (6) hours for 8 days. Course may be extended if directed by Pulmonologist     TRELEGY ELLIPTA  200-62.5-25 mcg inhaler  Generic drug: fluticasone -umeclidin-vilanter  Inhale 1 puff daily.  Replaces: TRELEGY ELLIPTA  100-62.5-25 mcg inhaler            CONTINUE taking these medications      albuterol  2.5 mg /3 mL (0.083 %) nebulizer solution  INHALE 3 ML BY NEBULIZATION THREE (3) TIMES A DAY.     albuterol  90 mcg/actuation inhaler  Commonly known as: PROVENTIL  HFA;VENTOLIN  HFA  Inhale 2 puffs four (4) times a day as needed.     ALCOHOL  PREP PADS Padm  Generic drug: alcohol  swabs   Use as directed with inhaled antibiotics     ascorbic acid  (vitamin C ) 1000 MG tablet  Commonly known as: VITAMIN C   Take 1 tablet (1,000 mg total) by mouth daily.     atorvastatin  10 MG tablet  Commonly known as: LIPITOR   Take 1 tablet (10 mg total) by mouth daily.     azithromycin  250 MG tablet  Commonly known as: ZITHROMAX   TAKE 1 TABLET BY MOUTH EVERY DAY     BD POSIFLUSH NORMAL SALINE 0.9 injection  Generic drug: sodium chloride   Inject 2mL of 0.9%NaCl into colistin vial & gently mix. After withdrawing colistin dose, add an additional 1mL of 0.9%NaCl to neb cup with the colistin dose.     BD REGULAR BEVEL NEEDLES 21 gauge x 1 1/2 Ndle  Generic drug: needle (disp) 21 G  Use as directed with inhaled Colistin     BRINSUPRI  25 mg tablet  Generic drug: brensocatib   Take 1 tablet (25 mg total) by mouth daily.     cholecalciferol  (vitamin D3-50 mcg (2,000 unit)) 50 mcg (2,000 unit) tablet  Take 1 tablet (50 mcg total) by mouth daily.     colistimethate  150 mg injection  Commonly known as: COLYMYCIN  Inject 2 mL sterile water   for injection to mix colistin vial, then draw up 2 mL (150mg ) and inhale 2 times a day, 28 days on and 28 days off.     dilTIAZem  120 MG 24 hr capsule  Commonly known as: CARDIZEM  CD  TAKE 1 CAPSULE BY MOUTH EVERY DAY     DUPIXENT  SYRINGE 200 mg/1.14 mL syringe  Generic drug: dupilumab   Inject the contents of 1 syringe (200 mg total) under the skin every fourteen (14) days.     empty container Misc  USE AS DIRECTED     empty container Misc  Commonly known as: sharps container  USE AS DIRECTED     empty container Misc  Use as directed to dispose of needles. When full, make sure lid is closed tightly then dispose of container in trash.     EPINEPHrine  0.3 mg/0.3 mL injection  Commonly known as: EPIPEN   Inject 0.3 mL (0.3 mg total) into the muscle once as needed for anaphylaxis (may repeat if needed) for up to 2 doses.     ferrous sulfate  325 (65 FE) MG tablet  Take 1 tablet (325 mg total) by mouth in the morning.     fluticasone  propionate 50 mcg/actuation nasal spray  Commonly known as: FLONASE   1 spray into each nostril daily.     furosemide  40 MG tablet  Commonly known as: LASIX   Take 1.5 tablets (60 mg total) by mouth daily.     HYDROcodone -acetaminophen  7.5-325 mg per tablet  Commonly known as: NORCO   Take 1 tablet by mouth three (3) times a day (at 6am, noon and 6pm).     LC PLUS Misc  Generic drug: nebulizers  Use with inhaled medications LC PLUS Misc  Generic drug: nebulizers  use with nebulized medications     LC PLUS Misc  Generic drug: nebulizers  Use as directed     magnesium  oxide 400 mg (241.3 mg elemental) tablet  Commonly known as: MAG-OX  Take 1 tablet (400 mg total) by mouth daily.     montelukast  10 mg tablet  Commonly known as: SINGULAIR   TAKE 1 TABLET BY MOUTH EVERY DAY AT NIGHT     multivitamin-minerals-lutein Tab  Take 1 tablet by mouth daily.     NON FORMULARY  Take 650 mg by mouth two (2) times a day. Brand: Citracal 650mg      OHTUVAYRE  3 mg/2.5 mL Nbsp  Generic drug: ensifentrine   Inhale 3 mg two (2) times a day.     omeprazole 20 MG capsule  Commonly known as: PriLOSEC  Take 1 capsule (20 mg total) by mouth Two (2) times a day.     OXYGEN -AIR DELIVERY SYSTEMS MISC  Inhale 2 mL.     PULMOZYME  1 mg/mL nebulizer solution  Generic drug: dornase alfa   Inhale 1 ampule (2.5 mg) via nebulizer daily. Use at least 30-60 minutes before airway clearance, or after airway clearance.     sodium chloride  10 % Nebu  Measure and Inhale 5 mL by nebulization Two (2) times a day. Discard remaining amount and use a new vial for each dose.     spironolactone  25 MG tablet  Commonly known as: ALDACTONE   Take 1 tablet (25 mg total) by mouth daily.     sterile water  Soln  Use 2mL to mix Colistin, then add additional 2mL to neb cup with 2mL of mixed colistin. Inhale twice daily 28 days on and 28 days off.     syringe with needle 3 mL 20 gauge x 1 1/2  Syrg  Commonly known as: BD LUER-LOK SYRINGE  For use with inhaled antibiotic (Colistin)     BD LUER-LOK SYRINGE 3 mL 21 gauge x 1 Syrg  Generic drug: syringe with needle  For use with inhaled antibiotic (Colistin)     tobramycin  (PF) 300 mg/5 mL nebulizer solution  Commonly known as: [name]   Inhale the contents of 1 ampule (300 mg total) by nebulization every twelve (12) hours. 28 days on and 28 days off.     zinc  gluconate 50 mg (7 mg elemental zinc ) tablet  Take 1 tablet (50 mg total) by mouth. zoledronic  acid-mannitol &water  5 mg/100 mL Pgbk  Commonly known as: RECLAST   Infusion              Allergies:  Gentamicin, Tobramycin , Mirtazapine , and Morphine  ______________________________________________________________________  Pending Test Results:  Pending Labs       Order Current Status    AFB culture In process    Lower Respiratory Culture Preliminary result            Most Recent Labs:  All lab results last 24 hours -   Recent Results (from the past 24 hours)   Comprehensive Metabolic Panel    Collection Time: 03/03/24  5:13 AM   Result Value Ref Range    Sodium 143 135 - 145 mmol/L    Potassium 3.5 3.4 - 4.8 mmol/L    Chloride 101 98 - 107 mmol/L    CO2 30.1 20.0 - 31.0 mmol/L    Anion Gap 12 5 - 14 mmol/L    BUN 22 9 - 23 mg/dL    Creatinine 8.71 (H) 0.73 - 1.18 mg/dL    BUN/Creatinine Ratio 17     eGFR CKD-EPI (2021) Male 61 >=60 mL/min/1.20m2    Glucose 88 70 - 179 mg/dL    Calcium  9.0 8.7 - 10.4 mg/dL    Albumin 3.2 (L) 3.4 - 5.0 g/dL    Total Protein 6.7 5.7 - 8.2 g/dL    Total Bilirubin 0.2 (L) 0.3 - 1.2 mg/dL    AST 23 <=65 U/L    ALT 23 10 - 49 U/L    Alkaline Phosphatase 69 46 - 116 U/L   CBC w/ Differential    Collection Time: 03/03/24  5:13 AM   Result Value Ref Range    WBC 6.0 3.6 - 11.2 10*9/L    RBC 3.97 (L) 4.26 - 5.60 10*12/L    HGB 12.2 (L) 12.9 - 16.5 g/dL    HCT 64.3 (L) 60.9 - 48.0 %    MCV 89.7 77.6 - 95.7 fL    MCH 30.7 25.9 - 32.4 pg    MCHC 34.2 32.0 - 36.0 g/dL    RDW 87.4 87.7 - 84.7 %    MPV 8.0 6.8 - 10.7 fL    Platelet 155 150 - 450 10*9/L    nRBC 0 <=4 /100 WBCs    Neutrophils % 64.0 %    Lymphocytes % 20.5 %    Monocytes % 11.4 %    Eosinophils % 2.8 %    Basophils % 1.3 %    Absolute Neutrophils 3.8 1.8 - 7.8 10*9/L    Absolute Lymphocytes 1.2 1.1 - 3.6 10*9/L    Absolute Monocytes 0.7 0.3 - 0.8 10*9/L    Absolute Eosinophils 0.2 0.0 - 0.5 10*9/L    Absolute Basophils 0.1 0.0 - 0.1 10*9/L   ECG 12 Lead    Collection Time: 03/03/24  2:41 PM   Result  Value Ref Range EKG Systolic BP  mmHg    EKG Diastolic BP  mmHg    EKG Ventricular Rate 86 BPM    EKG Atrial Rate 86 BPM    EKG P-R Interval 210 ms    EKG QRS Duration 100 ms    EKG Q-T Interval 400 ms    EKG QTC Calculation 478 ms    EKG Calculated P Axis 54 degrees    EKG Calculated R Axis -60 degrees    EKG Calculated T Axis 57 degrees    QTC Fredericia 451 ms       Relevant Studies/Radiology:  ECG 12 Lead  Result Date: 03/03/2024  SINUS RHYTHM WITH 1ST DEGREE AV BLOCK LEFT ANTERIOR FASCICULAR BLOCK ABNORMAL ECG WHEN COMPARED WITH ECG OF 23-Jul-2023 18:19, CRITERIA FOR SEPTAL INFARCT  ARE NO LONGER PRESENT   ______________________________________________________________________  Discharge Instructions:   Activity Instructions       Activity as tolerated                  Resources and Referrals    Home infusion has been ordered for you and is set up to be delivered to you at home following your discharge.  The agency providing your services are listed below.  Home Care [facility]  - Admitted Since 02/29/2024       Service Provider Services Address Referrals Phone Referrals Fax Patient Preferred    [facility] - [facility] Primary: Home Hospice  Secondary: Home Nursing [address], [place] [address] [phone] --    [facility] Homecare Specialist - Infusions   Home Health Care Primary: Home Health Care  Secondary: Home Infusion [address], [place] [address] [phone] --                        Follow Up instructions and Outpatient Referrals     Ambulatory Referral to Home Infusion      Reason for referral: IV therapy    Performing location?: [facility]    Home Health Requested Disciplines: Nursing     **Please contact your service pharmacist for assistance with discharge   home health infusion monitoring.      Call MD for:  difficulty breathing, headache or visual disturbances      Call MD for:  persistent nausea or vomiting      Call MD for:  severe uncontrolled pain      Call MD for:  temperature >38.5 Celsius      Discharge instructions          Appointments which have been scheduled for you      Mar 10, 2024 2:00 PM  (Arrive by 1:45 PM)  DEXA Bone Density with [place] DEXA RM 1  IMG DEXA [name] [address]  [facility] 1 through 4  [place] [address]  [phone]   No calcium  supplements 24 hrs prior.       Mar 13, 2024 4:00 PM  (Arrive by 3:45 PM)  INFUSION ONLY with UNCTIF 02  [facility] THERAPEUTIC INFUSION CTR [place] [facility] REGION) [address]  [facility] 1 through 4  [place] [address]  [phone]        May 16, 2024 8:50 AM  (Arrive by 8:35 AM)  PFT with PFT 2  [facility] PULMONARY SPECIALTY FUNCT [place] [address]  [place] 1 through 4  [place] [address]  [phone]   If you have inhaled breathing medications, please do not use it 4 hours prior to this breathing test.  Please wear comfortable clothing and well-fitting , closed toe shoes in the even that a 6-minute walk is part of your test.  If you have fallen in the past month please inform your respiratory therapist at the start of your appointment.    While you may bring a guest to your appointment, they will not be able to be present in the testing area.       May 16, 2024 9:30 AM  (Arrive by 9:15 AM)  RETURN BRONCHIECTSIS with Ronal Anette Arlean Blondie, MD  [facility] PULMONARY SPECIALTY CL [place] [facility] REGION) [address]  [facility] 1 through 4  [place] [address]  [phone]        Jul 24, 2024 2:15 PM  (Arrive by 2:00 PM)  RETURN CARDIOLOGY with Annalee CHRISTELLA Peon, MD  [facility] CARDIOLOGY [place] [facility] REGION) [address]  [facility] 1 through 4  [place] [address]  [phone]             ______________________________________________________________________  Discharge Day Services:  BP 118/65  - Pulse 90  - Temp 36.5 ??C (97.7 ??F) (Oral)  - Resp 18  - Ht 167.6 cm (5' 6)  - Wt 80.9 kg (178 lb 4.8 oz)  - SpO2 98%  - BMI 28.78 kg/m??     Pt seen on the day of discharge and determined appropriate for discharge.    Condition at Discharge: stable    Length of Discharge: I spent greater than 30 mins in the discharge of this patient.

## 2024-03-03 NOTE — Discharge Instr - Other Info (Signed)
 Home infusion has been ordered for you and is set up to be delivered to you at home following your discharge.  The agency providing your services are listed below.  Home Care [facility]  - Admitted Since 02/29/2024       Service Provider Services Address Referrals Phone Referrals Fax Patient Preferred    [facility] Health - [facility] Primary: Home Hospice  Secondary: Home Nursing [address] [place] [address], [place] [address] [phone] --    [facility] Homecare Specialist - Infusions   Home Health Care Primary: Home Health Care  Secondary: Home Infusion [address], [place] [address] [phone] --

## 2024-03-03 NOTE — Hospital Course (Addendum)
 Jordan [name] is a 69 y.o. male with who was directly admitted to [facility] for bronchiectasis exacerbation.      Bronchiectasis with acute exacerbation - Chronic hypoxemia needing supplemental O2 - Asthma/COPD overlap  He was directly admitted per pulmonologist (Dr. Blondie) for bronchiectasis exacerbation refractory to PO antibiotics, with increased fatigue and dyspnea on exertion. Prior respiratory cultures with Pseudomonas colonization, and LRCx from 12/9 also grew mucoid PsA. He is usually on home 2-3 L (usually 2) and was requiring up to 4L during this hospitalization. Pulmonology was consulted, and he was treated with IV Zosyn  and PO levofloxacin  750 mg daily, with continuation of home tobramycin  nebs and PO azithromycin . He was also continued on aggressive airway clearance, including bid hypertonic saline, his home Brinsupri  tablet and Ohtuvayre  nebs, his home montelukast , and the equivalent of his home Trelegy.     A single lumen PICC was placed on 12/11 to continue IV Zosyn  at home. He reported feeling an improvement in symptoms on 12/12, and he was discharged home with plan to continue course of PO levofloxacin  and IV Zosyn  for at least a 2-3 week course. His outpatient bronchiectasis team will continue to follow him outpatient. Per prior documentation by Dr. Blondie, prescribed increased Trelegy dose of 200-62.5-25 mcg on discharge as well.      Chronic pain - Peripheral neuropathy  On home Norco  7.5 mg-325 mg tid (scheduled). Continued this regimen on discharge.      Acute on chronic diastolic heart failure with preserved EF - Hypertension   Last TTE 01/17/24 with EF ~55%, normal RV function (poor windows). He recently had  worsening lower extremity edema and hypomagnesemia, which led to increased furosemide  (to 80 mg daily, then 60 mg for worse renal function) and initiation of spironolactone  outpatient. His home regimen of PO Lasix  60 mg, PO spironolactone  25 mg, and diltiazem  ER 120 mg daily was continued. He had trace to 1+ edema of his legs, L more so than R (which patient said was long-standing and chronic).     AKI  Recent Cr baseline of ~0.9 - 1.1, with Cr up to 1.46 on 12/03. Cr ~1.3 by discharge.      GERD  On home PPI, continued.     Osteoporosis  On zolendronic acid outpatient. Followed by Endocrinology. Checked Vit D inpatient, which was 65 - recommended decreasing his dose of Vit D 2000 units daily to ~400 units per day, with Endocrinology follow up.

## 2024-03-03 NOTE — Consults (Addendum)
 Primary Team: Please copy and paste below information into home infusion referral:     Disciplines requested: Nursing and Home IV Antibiotics    Physician to follow patient's care (the person listed here will be responsible for signing ongoing orders): Anette Arlean Kipper, MD  Outpatient Pharmacist to follow IV antibiotic therapy: CF Patients (Last Names A-K): Joesph Molt, CPP    Inpatient Start Date of Antibiotics (Day 1): 12/9  Start of Care Date (discharge date): 12/12  Estimate completion of therapy: 2-3 weeks per [facility] health/infusion please reach out to nurse contact (below) 2-3 days prior to end of therapy for appropriate orders  IV Access: PICC: Flush with 5 mLs of Normal Saline before and after each use and prn for line maintenance. Use after blood draws. Flush with Heparin  100units/mL 2 mLs at the end of each use and prn for line maintenance. Please change needleless cap weekly and after blood draws, or prn using aseptic technique. Please flush unused lumen(s) with Heparin  100units/mL 2 mLs every day.   Special Central line considerations:   (Allergies/Dressing preferences)      IV ANTIBIOTIC ORDERS:  Piperacillin -tazobactam (Zosyn ) 4.5g q6h    **DO NOT DRAW DRUG LEVELS FROM PORT**    LAB MONITORING ORDERS:      Start labs on 03/06/24    [Ceftazidime /Cefepime /Aztreonam/Ceftaroline/Piperacillin -tazobactam]-ONCE weekly (Mondays/Tuesdays): CBC with diff, BMP    Collect on MONDAYS of each week:  CBC + diff  Basic Metabolic Panel    Collect on THURSDAYS of each week (only for patients on aminoglycosides)  N/A    TARGET DRUG LEVELS:  -Not applicable    --------------------------------------------------------------------------------------  *PLEASE FAX RESULTS TO: [phone]    *PHONE CONTACT*:  Non-CF Bronchiectasis Patients/Pulmonary NTM for (L. Daniels/K. Patel/K. Olivier/K. Phares): Nurse Coordinator- Darice Lever, RN [phone]  For critical lab results or urgent matters outside of regular business hours (Mon through Fri 8:00AM-4:30PM, please call the hospital operator at [phone] and ask to page the pulmonary fellow on-call.    **Please warm hand-off to outpatient team nurse coordinator, pharmacist, and physician per contacts listed above**

## 2024-03-03 NOTE — Telephone Encounter (Signed)
 [facility] nurse with [facility] in [place] left a message with Darice Lever, bronchiectasis nurse coordinator. Kyung is scheduled to see Gardy tomorrow, 03/04/24. She had a few questions re: referral that was received for home health and home infusion.     Returned the call to [facility] on her work phone of [phone]. Advised that they are welcome to draw labs from the PICC line (no drug levels). Advised that they may draw labs on Monday or Tuesday based on nurse availability or patient preference. And lastly, they are welcome to reach out toward the end of the infusion period to learn if the team will be extending therapy. Anticipated 2 weeks of antibiotics, which would place end of therapy on around 03/13/24.     [facility] has the correct fax number for any labs or documents that need to be signed.     Nat MORTON Bonna, RN  CF Nurse Coordinator   [phone]

## 2024-03-03 NOTE — Plan of Care (Signed)
 Problem: Adult Inpatient Plan of Care  Goal: Absence of Hospital-Acquired Illness or Injury  Outcome: Discharged to Home  Goal: Optimal Comfort and Wellbeing  Outcome: Discharged to Home  Goal: Readiness for Transition of Care  Outcome: Discharged to Home  Goal: Rounds/Family Conference  Outcome: Discharged to Home     Problem: Comorbidity Management  Goal: Blood Pressure in Desired Range  Outcome: Discharged to Home     Problem: Pain Acute  Goal: Optimal Pain Control and Function  Outcome: Discharged to Home     Problem: Fall Injury Risk  Goal: Absence of Fall and Fall-Related Injury  Outcome: Discharged to Home     Problem: Airway Clearance Ineffective  Goal: Effective Airway Clearance  Outcome: Discharged to Home

## 2024-03-03 NOTE — Plan of Care (Signed)
 Shift Summary  Chest physiotherapy was performed with Vibralung and oxygen  therapy was humidified, with no adverse reactions noted.    Fall reduction and infection prevention interventions were maintained throughout the shift.    Pain remained at 0 and comfort interventions were effective.    Breath sounds remained diminished with crackles and cough was non-productive.    Patient remained independent with mobility and psychosocial status was stable.     Absence of Hospital-Acquired Illness or Injury: No new hospital-acquired injuries or complications documented during the shift; fall reduction program and skin protection interventions were consistently maintained.     Optimal Comfort and Wellbeing: Pain remained at 0 throughout the shift and no adverse reactions were reported; patient tolerated chest physiotherapy well.     Readiness for Transition of Care: Unplanned readmission score showed a slight increase but remained stable; patient was independent with mobility and psychosocial status was within defined limits.     Absence of Infection Signs and Symptoms: Aseptic technique, hand hygiene, and contact precautions were maintained; temperature remained stable and within normal limits.     Effective Airway Clearance: Breath sounds remained diminished with crackles bilaterally and cough was consistently non-productive; chest physiotherapy was performed with Vibralung and oxygen  therapy was humidified.

## 2024-03-04 MED ORDER — LEVOFLOXACIN 750 MG TABLET
ORAL_TABLET | Freq: Every day | ORAL | 0 refills | 10.00000 days | Status: CP
Start: 2024-03-04 — End: 2024-03-14

## 2024-03-06 NOTE — Progress Notes (Signed)
 Contacted patient's PCP office, [facility]. Patient scheduled for an appointment tomorrow at 0800 for urinary symptoms.

## 2024-03-06 NOTE — Progress Notes (Signed)
 [facility] Refill Coordination Note    Specialty Medication(s) to be Shipped:   CF/Pulmonary/Asthma: colistimethate  150 mg injection (COLYMYCIN)  Brinsupri   Dupixent   Pulmozyme  2.5mg /2.57ml    Other medication(s) to be shipped: No additional medications requested for fill at this time    Specialty Medications not needed at this time: N/A     Jordan [name], DOB: April 14, 1955  Phone: [phone] (home)       All above HIPAA information was verified with patient.     Was a nurse, learning disability used for this call? No    Completed refill call assessment today to schedule patient's medication shipment from the [facility] Delivery [facility]  [phone]).  All relevant notes have been reviewed.     Specialty medication(s) and dose(s) confirmed: Regimen is correct and unchanged.   Changes to medications: Erskine reports no changes at this time.  Changes to insurance: No  New side effects reported not previously addressed with a pharmacist or physician: None reported  Questions for the pharmacist: No    Confirmed patient received a Conservation Officer, Historic Buildings and a Surveyor, Mining with first shipment. The patient will receive a drug information handout for each medication shipped and additional FDA Medication Guides as required.       DISEASE/MEDICATION-SPECIFIC INFORMATION        N/A    SPECIALTY MEDICATION ADHERENCE     Medication Adherence    Patient reported X missed doses in the last month: 0  Specialty Medication: DUPIXENT  SYRINGE 200 mg/1.14 mL syringe (dupilumab )  Patient is on additional specialty medications: Yes  Additional Specialty Medications: BRINSUPRI  25 mg tablet (brensocatib )  Patient Reported Additional Medication X Missed Doses in the Last Month: 0  Patient is on more than two specialty medications: Yes  Specialty Medication: PULMOZYME  1 mg/mL nebulizer solution (dornase alfa )  Patient Reported Additional Medication X Missed Doses in the Last Month: 0  Support network for adherence: family member              Were doses missed due to medication being on hold? No    BRINSUPRI  25 mg tablet (brensocatib ): 7 doses of medicine on hand   DUPIXENT  SYRINGE 200 mg/1.14 mL syringe (dupilumab ): 0 doses of medicine on hand   PULMOZYME  1 mg/mL nebulizer solution (dornase alfa ): 7 doses of medicine on hand   colistimethate  150 mg injection (COLYMYCIN): 7 doses of medicine on hand       Specialty medication is an injection or given on a cycle: Yes, Next injection is scheduled for 03/11/24.    REFERRAL TO PHARMACIST     Referral to the pharmacist: Not needed      [facility]     Shipping address confirmed in Epic.     Cost and Payment: Patient has a $0 copay, payment information is not required.    Delivery Scheduled: Yes, Expected medication delivery date: 03/10/24.     Medication will be delivered via UPS to the prescription address in Epic WAM.    Armanie Ullmer   [facility]  Specialty Technician

## 2024-03-07 NOTE — Progress Notes (Signed)
 Outpatient Lab Results from 03/06/24 no drug levels (no results yet)    Outpatient IV Antimicrobial Therapy:  Start Date: 02/29/24  End Date: 03/13/24 +?  Line: PICC  Referring pulmonologist: Anette Arlean Kipper, MD    IV Antibiotics Ordered:  Piperacillin -tazobactam (Zosyn ) 4.5g IV every 6 hours    Oral/Inhaled Antibiotics:  Levofloxacin  750mg  by mouth once a day x 10 d(end 12/23)  Inhaled tobramycin  300mg  via nebulizer twice a day  Azithromycin  250 mg oral every day      03/07/24    Reviewed with patient:  Urine output: burning, some hematuria noted  Tinnitus/changes in hearing or having imbalance: Denies  Rash: Denies  Nausea/Vomiting/Diarrhea: loose BMs. Eating yogurt. Encouraged Florastor or similar  Any new medications started: No    PLAN     Pt able to see PCP 03/07/24 in AM for concern for UTI. Work up done. Notes swelling in the L foot & leg, but manageable for now. No additional issues.      1. Note forwarded to: Anette Arlean Kipper, MD  2. Note forwarded to CPP Joesph Molt, CPP    Darice JONELLE Lever, RN

## 2024-03-08 NOTE — Telephone Encounter (Signed)
 Returned recruitment consultant, Spoke with manager Jenna @ [facility]  Discrepancy with Monday's blood draw & name on label. Labs were re-drawn today & should have results hopefully for tomorrow (12/18).  Clarified that as we are not checking drug levels on patient, ok to draw labs from PICC.

## 2024-03-09 MED FILL — BD POSIFLUSH NORMAL SALINE 0.9 % INJECTION SYRINGE: 30 days supply | Qty: 180 | Fill #4

## 2024-03-09 MED FILL — BD LUER-LOK SYRINGE 3 ML 21 GAUGE X 1": ORAL | 30 days supply | Qty: 60 | Fill #3

## 2024-03-09 MED FILL — BD REGULAR BEVEL NEEDLES 21 GAUGE X 1 1/2": 20 days supply | Qty: 40 | Fill #6

## 2024-03-09 MED FILL — COLISTIN (COLISTIMETHATE SODIUM) 150 MG SOLUTION FOR INJECTION: INTRAMUSCULAR | 60 days supply | Qty: 60 | Fill #4

## 2024-03-09 MED FILL — DUPIXENT 200 MG/1.14 ML SUBCUTANEOUS SYRINGE: SUBCUTANEOUS | 28 days supply | Qty: 2.28 | Fill #6

## 2024-03-09 MED FILL — PULMOZYME 1 MG/ML SOLUTION FOR INHALATION: RESPIRATORY_TRACT | 30 days supply | Qty: 75 | Fill #10

## 2024-03-09 MED FILL — BRINSUPRI 25 MG TABLET: ORAL | 30 days supply | Qty: 30 | Fill #3

## 2024-03-10 ENCOUNTER — Inpatient Hospital Stay: Admit: 2024-03-10 | Discharge: 2024-03-10 | Payer: Medicare (Managed Care)

## 2024-03-10 NOTE — Progress Notes (Signed)
 [facility] Pharmacist Note   Purpose: Outpatient Home IV Antibiotic Lab Monitoring  Results from 03/08/24, see media tab    Outpatient IV Antimicrobial Therapy:  Start Date: 12/9  End Date: 12/22 (2 weeks) or 12/29 (3 weeks)  Line: PICC  Referring pulmonologist: Anette Arlean Kipper, MD    IV Antibiotics Ordered:  Piperacillin -tazobactam (Zosyn ) 4.5g IV every 6 hours    Oral/Inhaled Antibiotics:  Levofloxacin  750mg  by mouth once a day     GENERAL LABS:  No results for input(s): WBC, HGB, HCT, PLT, NEUTROABS, LYMPHSABS, MONOABS, EOSABS, BASOSABS in the last 168 hours.  No results for input(s): BUN, CREATININE, PROT, BILITOT, ALKPHOS, ALT, AST, MG in the last 168 hours.  No results for input(s): ESR, CRP in the last 168 hours.  Recommendations at this time unless clinically indicated otherwise:  No CMP or BMP available for review at this time  General labs are WNL/stable.   Continue piperacillin -tazobactam 4.5g IV every 6 hours.    Next Labs:   Due on Mondays (12/22)  CBC + diff  Basic Metabolic Panel         Electronically signed:  Joesph Molt, PharmD, BCACP, CPP  Clinical Pharmacist Practitioner  [facility]      Consult note routed back to:   Anette Arlean Kipper, MD  Darice Lever, Bronchiectasis Nurse Coordinator

## 2024-03-13 ENCOUNTER — Encounter: Admit: 2024-03-13 | Discharge: 2024-03-14 | Payer: Medicare (Managed Care)

## 2024-03-13 MED ADMIN — zoledronic acid-mannitol&water (RECLAST) 5 mg/100 mL infusion 5 mg: 5 mg | INTRAVENOUS | @ 21:00:00 | Stop: 2024-03-13

## 2024-03-13 MED ADMIN — heparin, porcine (PF) 100 unit/mL injection 200 Units: 200 [IU] | INTRAVENOUS | @ 22:00:00 | Stop: 2024-03-13

## 2024-03-13 NOTE — Progress Notes (Signed)
 Labs  Creatine 1.28  GFR 61  Calcium  9.0  Patient presents for reclast  infusion. Patient had last year and tolerated infusion with no adverse reactions. Patient educated on reclast  actions, common side effects, importance of drinking extra fluids for the next 48 hrs, no major dental procedres for next few months, importance of supplementing with vitamin D and calcium  per MD orders. She verbalized understanding of education.    Patient had a picc line recently getting good blood return  using aseptic technique. No signs of infiltration or complications noted at the insertion site. Call bell placed within reach; pt advised on how to notify staff for assistance. Patient positioned for comfort.   1621 Reclast  started   1650   completed reclast  infusion  Patient tolerated infusion  VSS. IV flushed with 2 0mL Ns followed by heparin  flush  per policy. Patient left clinic ambulatory, no acute distress noted at departure.

## 2024-03-13 NOTE — Progress Notes (Signed)
 Outpatient Lab Results from 03/13/24 no drug levels   Next due: 12/29    Outpatient IV Antimicrobial Therapy:  Start Date: 02/29/24  End Date: 03/20/24 *end of therapy (3 weeks)  Line: PICC  Referring pulmonologist: Anette Arlean Kipper, MD    IV Antibiotics Ordered:  Piperacillin -tazobactam (Zosyn ) 4.5g IV every 6 hours    Oral/Inhaled Antibiotics:  Levofloxacin  750mg  by mouth once a day x 10 d(end 12/23)  Inhaled tobramycin  300mg  via nebulizer twice a day  Azithromycin  250 mg oral every day      03/13/2024    Reviewed with patient:  Urine output: burning, some hematuria noted  Tinnitus/changes in hearing or having imbalance: Denies  Rash: Denies  Nausea/Vomiting/Diarrhea: loose BMs. Eating yogurt. Started probiotic. Denies fever  Any new medications started: No    PLAN     Pt reports that his pulmonary symptoms- cough, shortness of breath, sputum production (medium-dark brown) have improved a bit, but that he believes he needs a 3rd week on intravenous antibiotics as he has in the past. States it's challenging to determine much measurable improvement. His BLE edema is returning, but this is not surprising to patient with the excess in fluids from the IVs.  Will monitor for blood work results.    Note forwarded to CPP Joesph Molt, CPP    Darice JONELLE Lever, RN

## 2024-03-14 NOTE — Progress Notes (Signed)
 [facility] Pharmacist Note   Purpose: Outpatient Home IV Antibiotic Lab Monitoring  Results from 03/13/24, see media tab    Outpatient IV Antimicrobial Therapy:  Start Date: 12/9  End Date: 12/22 (2 weeks) or 12/29 (3 weeks)  Line: PICC  Referring pulmonologist: Anette Arlean Kipper, MD    IV Antibiotics Ordered:  Piperacillin -tazobactam (Zosyn ) 4.5g IV every 6 hours    Oral/Inhaled Antibiotics:  Levofloxacin  750mg  by mouth once a day     GENERAL LABS:  No results for input(s): WBC, HGB, HCT, PLT, NEUTROABS, LYMPHSABS, MONOABS, EOSABS, BASOSABS in the last 168 hours.  No results for input(s): BUN, CREATININE, PROT, BILITOT, ALKPHOS, ALT, AST, MG in the last 168 hours.  No results for input(s): ESR, CRP in the last 168 hours.  Recommendations at this time unless clinically indicated otherwise:  Renal function stable (Scr 1.31 >1.28 >1.35)  General labs are WNL/stable.   Continue piperacillin -tazobactam 4.5g IV every 6 hours.    Next Labs:   Due on Mondays (12/29)  CBC + diff  Basic Metabolic Panel         Electronically signed:  Joesph Molt, PharmD, BCACP, CPP  Clinical Pharmacist Practitioner  [facility]      Consult note routed back to:   Anette Arlean Kipper, MD  Darice Lever, Bronchiectasis Nurse Coordinator

## 2024-03-15 DIAGNOSIS — Z792 Long term (current) use of antibiotics: Principal | ICD-10-CM

## 2024-03-29 DIAGNOSIS — I5033 Acute on chronic diastolic (congestive) heart failure: Principal | ICD-10-CM

## 2024-04-05 ENCOUNTER — Ambulatory Visit: Admit: 2024-04-05 | Discharge: 2024-04-06 | Payer: Medicare (Managed Care)

## 2024-04-05 LAB — BASIC METABOLIC PANEL
ANION GAP: 12 mmol/L (ref 5–14)
BLOOD UREA NITROGEN: 24 mg/dL — ABNORMAL HIGH (ref 9–23)
BUN / CREAT RATIO: 16
CALCIUM: 10.1 mg/dL (ref 8.7–10.4)
CHLORIDE: 97 mmol/L — ABNORMAL LOW (ref 98–107)
CO2: 31.6 mmol/L — ABNORMAL HIGH (ref 20.0–31.0)
CREATININE: 1.47 mg/dL — ABNORMAL HIGH (ref 0.73–1.18)
EGFR CKD-EPI (2021) MALE: 51 mL/min/1.73m2 — ABNORMAL LOW (ref >=60)
GLUCOSE RANDOM: 91 mg/dL (ref 70–179)
POTASSIUM: 4.7 mmol/L (ref 3.4–4.8)
SODIUM: 141 mmol/L (ref 135–145)

## 2024-04-05 LAB — MAGNESIUM: MAGNESIUM: 2.4 mg/dL (ref 1.6–2.6)

## 2024-04-05 LAB — PRO-BNP: PRO-BNP: 543 pg/mL — ABNORMAL HIGH (ref <=300.0)

## 2024-04-07 DIAGNOSIS — J479 Bronchiectasis, uncomplicated: Principal | ICD-10-CM

## 2024-04-07 MED ORDER — PULMOZYME 1 MG/ML SOLUTION FOR INHALATION
Freq: Every day | RESPIRATORY_TRACT | 11 refills | 30.00000 days | Status: CP
Start: 2024-04-07 — End: 2025-04-07

## 2024-04-07 MED ORDER — SODIUM CHLORIDE 10 % FOR NEBULIZATION
Freq: Two times a day (BID) | RESPIRATORY_TRACT | 3 refills | 90.00000 days | Status: CP
Start: 2024-04-07 — End: ?
  Filled 2024-04-24: qty 900, 30d supply, fill #0

## 2024-04-12 DIAGNOSIS — J479 Bronchiectasis, uncomplicated: Principal | ICD-10-CM

## 2024-04-20 NOTE — Progress Notes (Signed)
 [facility] Special[facility] and Home Delivery Pharmacy Refill Coordination Note    Specialty Medication(s) to be Shipped:   CF/Pulmonary/Asthma: Brinsupri   Tobramycin  300mg /20mL, Pari nebulizer cup and Inflammatory Disorders: Dupixent     Other medication(s) to be shipped: sodium chloride  10 %    Specialty Medications not needed at this time: N/A     Jordan [name], DOB: September 20, 1955  Phone: [phone] (home)       All above HIPAA information was verified with patient.     Was a nurse, learning disability used for this call? No    Completed refill call assessment today to schedule patient's medication shipment from the [facility] Delivery Pharm[facility]  [phone]).  All relevant notes have been reviewed.     Specialty medication(s) and dose(s) confirmed: Regimen is correct and unchanged.   Changes to medications: Jordan [name] reports no changes at this time.  Changes to insurance: No  New side effects reported not previously addressed with a pharmacist or physician: None reported  Questions for the pharmacist: No    Confirmed patient received a Conservation Officer, Historic Buildings and a Surveyor, Mining with first shipment. The patient will receive a drug information handout for each medication shipped and additional FDA Medication Guides as required.       DISEASE/MEDICATION-SPECIFIC INFORMATION        N/A    SPECIALTY MEDICATION ADHERENCE     Medication Adherence    Patient reported X missed doses in the last month: 0  Specialty Medication: brensocatib : BRINSUPRI  25 mg tablet  Patient is on additional specialty medications: Yes  Additional Specialty Medications: dupilumab : DUPIXENT  SYRINGE 200 mg/1.14 mL syringe  Patient Reported Additional Medication X Missed Doses in the Last Month: 0  Patient is on more than two specialty medications: Yes  Specialty Medication: tobramycin  (PF) 300 mg/5 mL nebulizer solution ([name] )  Patient Reported Additional Medication X Missed Doses in the Last Month: 0  Support network for adherence: family member Were doses missed due to medication being on hold? No    dupilumab : DUPIXENT  SYRINGE 200 mg/1.14 mL syringe 0 doses of medicine on hand   tobramycin  (PF) 300 mg/5 mL nebulizer solution ([name] ) : 0 doses of medicine on hand   brensocatib : BRINSUPRI  25 mg tablet 5 days of medicine on hand       Specialty medication is an injection or given on a cycle: Yes, Next injection is scheduled for 04/29/2024.    REFERRAL TO PHARMACIST     Referral to the pharmacist: Not needed      [facility]     Shipping address confirmed in Epic.     Cost and Payment: Patient has a copay of $2236.97. They are aware and have authorized the pharmacy to charge the credit card on file.    Delivery Scheduled: Yes, Expected medication delivery date: 04/21/2024.     Medication will be delivered via UPS to the prescription address in Epic WAM.    Nelida Winfred [facility]  Specialty Technician

## 2024-04-24 MED FILL — TOBRAMYCIN 300 MG/5 ML IN 0.225 % SODIUM CHLORIDE FOR NEBULIZATION: RESPIRATORY_TRACT | 56 days supply | Qty: 280 | Fill #2

## 2024-04-24 MED FILL — BRINSUPRI 25 MG TABLET: ORAL | 30 days supply | Qty: 30 | Fill #4

## 2024-04-24 MED FILL — DUPIXENT 200 MG/1.14 ML SUBCUTANEOUS SYRINGE: SUBCUTANEOUS | 28 days supply | Qty: 2.28 | Fill #7
